# Patient Record
Sex: Male | Born: 1965 | Race: White | Hispanic: No | Marital: Married | Smoking: Never smoker
Health system: Southern US, Community
[De-identification: ages and names within clinical notes are randomized; demographics above are authoritative.]

## PROBLEM LIST (undated history)

## (undated) DIAGNOSIS — H269 Unspecified cataract: Secondary | ICD-10-CM

## (undated) DIAGNOSIS — E119 Type 2 diabetes mellitus without complications: Secondary | ICD-10-CM

## (undated) DIAGNOSIS — G4730 Sleep apnea, unspecified: Secondary | ICD-10-CM

## (undated) DIAGNOSIS — S82899A Other fracture of unspecified lower leg, initial encounter for closed fracture: Secondary | ICD-10-CM

## (undated) DIAGNOSIS — E785 Hyperlipidemia, unspecified: Secondary | ICD-10-CM

## (undated) DIAGNOSIS — E11319 Type 2 diabetes mellitus with unspecified diabetic retinopathy without macular edema: Secondary | ICD-10-CM

## (undated) DIAGNOSIS — I10 Essential (primary) hypertension: Secondary | ICD-10-CM

## (undated) DIAGNOSIS — Z8669 Personal history of other diseases of the nervous system and sense organs: Secondary | ICD-10-CM

## (undated) DIAGNOSIS — S42309A Unspecified fracture of shaft of humerus, unspecified arm, initial encounter for closed fracture: Secondary | ICD-10-CM

## (undated) DIAGNOSIS — M869 Osteomyelitis, unspecified: Secondary | ICD-10-CM

## (undated) HISTORY — DX: Hyperlipidemia, unspecified: E78.5

## (undated) HISTORY — DX: Personal history of other diseases of the nervous system and sense organs: Z86.69

## (undated) HISTORY — DX: Sleep apnea, unspecified: G47.30

## (undated) HISTORY — DX: Type 2 diabetes mellitus with unspecified diabetic retinopathy without macular edema: E11.319

## (undated) HISTORY — PX: WISDOM TOOTH EXTRACTION: SHX21

## (undated) HISTORY — DX: Unspecified fracture of shaft of humerus, unspecified arm, initial encounter for closed fracture: S42.309A

## (undated) HISTORY — DX: Osteomyelitis, unspecified: M86.9

## (undated) HISTORY — PX: APPENDECTOMY: SHX54

## (undated) HISTORY — DX: Other fracture of unspecified lower leg, initial encounter for closed fracture: S82.899A

## (undated) HISTORY — DX: Unspecified cataract: H26.9

## (undated) HISTORY — PX: EYE SURGERY: SHX253

---

## 1999-07-13 ENCOUNTER — Emergency Department (HOSPITAL_COMMUNITY): Admission: EM | Admit: 1999-07-13 | Discharge: 1999-07-13 | Payer: Self-pay | Admitting: Emergency Medicine

## 1999-07-13 ENCOUNTER — Encounter: Payer: Self-pay | Admitting: Emergency Medicine

## 1999-07-22 ENCOUNTER — Ambulatory Visit (HOSPITAL_COMMUNITY): Admission: RE | Admit: 1999-07-22 | Discharge: 1999-07-22 | Payer: Self-pay | Admitting: *Deleted

## 1999-07-22 ENCOUNTER — Encounter: Payer: Self-pay | Admitting: *Deleted

## 1999-10-05 ENCOUNTER — Encounter: Admission: RE | Admit: 1999-10-05 | Discharge: 2000-01-03 | Payer: Self-pay | Admitting: Internal Medicine

## 2002-05-28 ENCOUNTER — Encounter: Payer: Self-pay | Admitting: Emergency Medicine

## 2002-05-28 ENCOUNTER — Emergency Department (HOSPITAL_COMMUNITY): Admission: EM | Admit: 2002-05-28 | Discharge: 2002-05-28 | Payer: Self-pay | Admitting: Emergency Medicine

## 2004-01-07 ENCOUNTER — Emergency Department (HOSPITAL_COMMUNITY): Admission: EM | Admit: 2004-01-07 | Discharge: 2004-01-07 | Payer: Self-pay | Admitting: Emergency Medicine

## 2005-11-17 HISTORY — PX: COLONOSCOPY: SHX174

## 2006-06-20 ENCOUNTER — Emergency Department (HOSPITAL_COMMUNITY): Admission: EM | Admit: 2006-06-20 | Discharge: 2006-06-20 | Payer: Self-pay | Admitting: Emergency Medicine

## 2007-07-09 ENCOUNTER — Ambulatory Visit (HOSPITAL_COMMUNITY): Admission: RE | Admit: 2007-07-09 | Discharge: 2007-07-09 | Payer: Self-pay | Admitting: Internal Medicine

## 2007-07-09 ENCOUNTER — Telehealth (INDEPENDENT_AMBULATORY_CARE_PROVIDER_SITE_OTHER): Payer: Self-pay

## 2007-07-10 DIAGNOSIS — E119 Type 2 diabetes mellitus without complications: Secondary | ICD-10-CM | POA: Insufficient documentation

## 2007-07-10 DIAGNOSIS — I10 Essential (primary) hypertension: Secondary | ICD-10-CM | POA: Insufficient documentation

## 2007-07-10 DIAGNOSIS — K7689 Other specified diseases of liver: Secondary | ICD-10-CM | POA: Insufficient documentation

## 2007-07-10 DIAGNOSIS — R1011 Right upper quadrant pain: Secondary | ICD-10-CM | POA: Insufficient documentation

## 2007-07-11 ENCOUNTER — Ambulatory Visit: Payer: Self-pay | Admitting: Gastroenterology

## 2009-06-07 DIAGNOSIS — E781 Pure hyperglyceridemia: Secondary | ICD-10-CM | POA: Insufficient documentation

## 2010-02-01 NOTE — Progress Notes (Signed)
Summary: triage  Phone Note From Other Clinic Call back at [phone]   Caller: Referral Coordinator Malachi Bonds Dr Avva's office Call For: any doc Reason for Call: Schedule Patient Appt Summary of Call: Dr Felipa Eth want this pt seen asap b4 1st avail 7-29 due to abd pain nausea no fever no gb problem pt had normal abd u/s but has a lot of abd pain Initial call taken by: Tawni Levy,  July 09, 2007 2:42 PM  Follow-up for Phone Call        pt scheduled to [facility] for appt with Dr Jarold Motto 07-11-07 8:45. Follow-up by: Darcey Nora RN,  July 09, 2007 2:50 PM

## 2010-05-06 DIAGNOSIS — F39 Unspecified mood [affective] disorder: Secondary | ICD-10-CM | POA: Insufficient documentation

## 2014-04-01 ENCOUNTER — Encounter (HOSPITAL_BASED_OUTPATIENT_CLINIC_OR_DEPARTMENT_OTHER): Payer: Managed Care, Other (non HMO) | Attending: Surgery

## 2014-04-01 DIAGNOSIS — L84 Corns and callosities: Secondary | ICD-10-CM | POA: Diagnosis present

## 2014-04-01 DIAGNOSIS — Z794 Long term (current) use of insulin: Secondary | ICD-10-CM | POA: Insufficient documentation

## 2014-04-01 DIAGNOSIS — L97421 Non-pressure chronic ulcer of left heel and midfoot limited to breakdown of skin: Secondary | ICD-10-CM | POA: Insufficient documentation

## 2014-04-01 DIAGNOSIS — E11621 Type 2 diabetes mellitus with foot ulcer: Secondary | ICD-10-CM | POA: Diagnosis not present

## 2014-04-08 ENCOUNTER — Encounter (HOSPITAL_BASED_OUTPATIENT_CLINIC_OR_DEPARTMENT_OTHER): Payer: Managed Care, Other (non HMO) | Attending: Surgery

## 2014-04-08 DIAGNOSIS — E11621 Type 2 diabetes mellitus with foot ulcer: Secondary | ICD-10-CM | POA: Insufficient documentation

## 2014-04-08 DIAGNOSIS — L97521 Non-pressure chronic ulcer of other part of left foot limited to breakdown of skin: Secondary | ICD-10-CM | POA: Insufficient documentation

## 2014-04-10 ENCOUNTER — Encounter (HOSPITAL_BASED_OUTPATIENT_CLINIC_OR_DEPARTMENT_OTHER): Payer: Self-pay

## 2014-04-15 DIAGNOSIS — E11621 Type 2 diabetes mellitus with foot ulcer: Secondary | ICD-10-CM | POA: Diagnosis not present

## 2014-04-22 DIAGNOSIS — E11621 Type 2 diabetes mellitus with foot ulcer: Secondary | ICD-10-CM | POA: Diagnosis not present

## 2014-04-24 DIAGNOSIS — E11621 Type 2 diabetes mellitus with foot ulcer: Secondary | ICD-10-CM | POA: Diagnosis not present

## 2014-04-29 DIAGNOSIS — E11621 Type 2 diabetes mellitus with foot ulcer: Secondary | ICD-10-CM | POA: Diagnosis not present

## 2014-04-30 ENCOUNTER — Emergency Department (HOSPITAL_COMMUNITY)
Admission: EM | Admit: 2014-04-30 | Discharge: 2014-04-30 | Disposition: A | Discharge status: Home / Self Care | Payer: Managed Care, Other (non HMO) | Attending: Emergency Medicine | Admitting: Emergency Medicine

## 2014-04-30 ENCOUNTER — Encounter (HOSPITAL_COMMUNITY): Payer: Self-pay | Admitting: Emergency Medicine

## 2014-04-30 DIAGNOSIS — M545 Low back pain, unspecified: Secondary | ICD-10-CM

## 2014-04-30 MED ORDER — NAPROXEN 500 MG PO TABS: 500.0000 mg | ORAL_TABLET | Freq: Two times a day (BID) | ORAL | Status: DC

## 2014-04-30 MED ORDER — TRAMADOL HCL 50 MG PO TABS: 50.0000 mg | ORAL_TABLET | Freq: Four times a day (QID) | ORAL | Status: DC | PRN

## 2014-04-30 MED ORDER — METHOCARBAMOL 500 MG PO TABS: 500.0000 mg | ORAL_TABLET | Freq: Two times a day (BID) | ORAL | Status: DC

## 2014-04-30 NOTE — ED Provider Notes (Signed)
CSN: [id number]     Arrival date & time 04/30/14  1421 History  This chart was scribed for Hyman Bible PA-C working with Linton Flemings, MD by Mercy Moore, ED Scribe. This patient was seen in room TR07C/TR07C and the patient's care was started at 3:32 PM.   Chief Complaint  Patient presents with  . Back Pain    The history is provided by the patient. No language interpreter was used.   HPI Comments: [name] is a 49 y.o. male who presents to the Emergency Department complaining of worsening middle lower back pain since stepping out of the shower yesterday; patient suspects he strained a muscle. Patient reports spasming pain at his lower back even at rest and severely exacerbated pain with ambulation. Patient with history of Diabetes shares that he is wearing the cast to relieve pressure to a wound on his left heel that wound not heal appropriately. Patient denies fever, chills, bowel/bladder incontinence, numbness or tingling in his extremities. Patient denies personal history of cancer or IV drug use.   History reviewed. No pertinent past medical history. History reviewed. No pertinent past surgical history. History reviewed. No pertinent family history. History  Substance Use Topics  . Smoking status: Never Smoker   . Smokeless tobacco: Not on file  . Alcohol Use: No    Review of Systems  Constitutional: Negative for fever.  Gastrointestinal: Negative for nausea, vomiting, diarrhea and constipation.  Genitourinary: Negative for dysuria.  Musculoskeletal: Positive for back pain.  Skin: Positive for wound.      Allergies  Review of patient's allergies indicates no known allergies.  Home Medications   Prior to Admission medications   Not on File   Triage Vitals: BP 112/69 mmHg  Pulse 88  Temp(Src) 98.1 F (36.7 C) (Oral)  Resp 18  Ht 6\' 2"  (1.88 m)  Wt 270 lb (122.471 kg)  BMI 34.65 kg/m2  SpO2 97% Physical Exam  Constitutional: He is oriented to person,  place, and time. He appears well-developed and well-nourished. No distress.  HENT:  Head: Normocephalic and atraumatic.  Eyes: EOM are normal.  Neck: Neck supple. No tracheal deviation present.  Cardiovascular: Normal rate, regular rhythm and normal heart sounds.   Pulmonary/Chest: Effort normal and breath sounds normal. No respiratory distress.  Musculoskeletal: Normal range of motion.  No tenderness to palpation of lumbar spine. 2+ PT reflexes bilaterally. Muscle strength 5/5 in lower extremities bilaterally. 2+ DP pulse. Distal sensation of right foot intact.  Left foot in cast.   Neurological: He is alert and oriented to person, place, and time. He has normal strength. No sensory deficit. Gait normal.  Skin: Skin is warm and dry.  Psychiatric: He has a normal mood and affect. His behavior is normal.  Nursing note and vitals reviewed.   ED Course  Procedures (including critical care time)  COORDINATION OF CARE:  3:36 PM- Discussed treatment plan with patient at bedside and patient agreed to plan.   Labs Review Labs Reviewed - No data to display  Imaging Review No results found.   EKG Interpretation None      MDM   Final diagnoses:  None   Patient with back pain.  Suspect muscle strain.  No neurological deficits and normal neuro exam.  Patient can walk but states is painful.  No loss of bowel or bladder control.  No concern for cauda equina.  No fever, night sweats, weight loss, h/o cancer, IVDU.  RICE protocol and pain medicine indicated and  discussed with patient.   Patient stable for discharge.  Return precautions given.    Hyman Bible, PA-C 04/30/14 1609  Linton Flemings, MD 04/30/14 [phone]

## 2014-04-30 NOTE — ED Notes (Signed)
Declined W/C at D/C and was escorted to lobby by RN. 

## 2014-04-30 NOTE — ED Notes (Signed)
Pt c/o lower back pain with spasm

## 2014-04-30 NOTE — Discharge Instructions (Signed)
Take pain medication and muscle relaxer as needed.  Do not drive or operate heavy machinery for 4-6 hours after taking medication.

## 2014-05-06 ENCOUNTER — Encounter (HOSPITAL_BASED_OUTPATIENT_CLINIC_OR_DEPARTMENT_OTHER): Payer: Managed Care, Other (non HMO) | Attending: Surgery

## 2014-05-06 DIAGNOSIS — E11621 Type 2 diabetes mellitus with foot ulcer: Secondary | ICD-10-CM | POA: Diagnosis not present

## 2014-05-06 DIAGNOSIS — L97521 Non-pressure chronic ulcer of other part of left foot limited to breakdown of skin: Secondary | ICD-10-CM | POA: Insufficient documentation

## 2014-05-06 DIAGNOSIS — G629 Polyneuropathy, unspecified: Secondary | ICD-10-CM | POA: Insufficient documentation

## 2014-12-30 ENCOUNTER — Emergency Department (HOSPITAL_COMMUNITY)
Admission: EM | Admit: 2014-12-30 | Discharge: 2014-12-31 | Disposition: A | Discharge status: Home / Self Care | Payer: Managed Care, Other (non HMO) | Attending: Emergency Medicine | Admitting: Emergency Medicine

## 2014-12-30 ENCOUNTER — Encounter (HOSPITAL_COMMUNITY): Payer: Self-pay | Admitting: *Deleted

## 2014-12-30 ENCOUNTER — Emergency Department (HOSPITAL_COMMUNITY): Payer: Managed Care, Other (non HMO)

## 2014-12-30 DIAGNOSIS — L03115 Cellulitis of right lower limb: Secondary | ICD-10-CM | POA: Diagnosis not present

## 2014-12-30 DIAGNOSIS — L97519 Non-pressure chronic ulcer of other part of right foot with unspecified severity: Secondary | ICD-10-CM | POA: Diagnosis not present

## 2014-12-30 DIAGNOSIS — M79661 Pain in right lower leg: Secondary | ICD-10-CM | POA: Diagnosis present

## 2014-12-30 DIAGNOSIS — Z791 Long term (current) use of non-steroidal anti-inflammatories (NSAID): Secondary | ICD-10-CM | POA: Diagnosis not present

## 2014-12-30 DIAGNOSIS — E10621 Type 1 diabetes mellitus with foot ulcer: Secondary | ICD-10-CM | POA: Insufficient documentation

## 2014-12-30 DIAGNOSIS — I10 Essential (primary) hypertension: Secondary | ICD-10-CM | POA: Insufficient documentation

## 2014-12-30 DIAGNOSIS — Z79899 Other long term (current) drug therapy: Secondary | ICD-10-CM | POA: Insufficient documentation

## 2014-12-30 HISTORY — DX: Type 2 diabetes mellitus without complications: E11.9

## 2014-12-30 HISTORY — DX: Essential (primary) hypertension: I10

## 2014-12-30 LAB — CBC WITH DIFFERENTIAL/PLATELET
Basophils Absolute: 0 10*3/uL (ref 0.0–0.1)
Basophils Relative: 0 %
Eosinophils Absolute: 0 10*3/uL (ref 0.0–0.7)
Eosinophils Relative: 0 %
HCT: 45.1 % (ref 39.0–52.0)
Hemoglobin: 15.6 g/dL (ref 13.0–17.0)
Lymphocytes Relative: 12 %
Lymphs Abs: 1.8 10*3/uL (ref 0.7–4.0)
MCH: 30.1 pg (ref 26.0–34.0)
MCHC: 34.6 g/dL (ref 30.0–36.0)
MCV: 87.1 fL (ref 78.0–100.0)
Monocytes Absolute: 1.4 10*3/uL — ABNORMAL HIGH (ref 0.1–1.0)
Monocytes Relative: 9 %
Neutro Abs: 11.8 10*3/uL — ABNORMAL HIGH (ref 1.7–7.7)
Neutrophils Relative %: 79 %
Platelets: 253 10*3/uL (ref 150–400)
RBC: 5.18 MIL/uL (ref 4.22–5.81)
RDW: 12.8 % (ref 11.5–15.5)
WBC: 14.9 10*3/uL — ABNORMAL HIGH (ref 4.0–10.5)

## 2014-12-30 LAB — BASIC METABOLIC PANEL
Anion gap: 10 (ref 5–15)
BUN: 29 mg/dL — ABNORMAL HIGH (ref 6–20)
CO2: 25 mmol/L (ref 22–32)
Calcium: 8.8 mg/dL — ABNORMAL LOW (ref 8.9–10.3)
Chloride: 100 mmol/L — ABNORMAL LOW (ref 101–111)
Creatinine, Ser: 0.98 mg/dL (ref 0.61–1.24)
GFR calc Af Amer: >60 mL/min (ref >60)
GFR calc non Af Amer: >60 mL/min (ref >60)
Glucose, Bld: 107 mg/dL — ABNORMAL HIGH (ref 65–99)
Potassium: 4.4 mmol/L (ref 3.5–5.1)
Sodium: 135 mmol/L (ref 135–145)

## 2014-12-30 MED ORDER — VANCOMYCIN HCL 10 G IV SOLR
2000.0000 mg | Freq: Once | INTRAVENOUS | Status: AC
  Administered 2014-12-31: 2000 mg via INTRAVENOUS
  Filled 2014-12-30: qty 2000

## 2014-12-30 MED ORDER — VANCOMYCIN HCL 10 G IV SOLR
1250.0000 mg | Freq: Three times a day (TID) | INTRAVENOUS | Status: DC
  Filled 2014-12-30: qty 1250

## 2014-12-30 MED ORDER — SODIUM CHLORIDE 0.9 % IV SOLN
3.0000 g | Freq: Once | INTRAVENOUS | Status: AC
  Administered 2014-12-30: 3 g via INTRAVENOUS
  Filled 2014-12-30: qty 3

## 2014-12-30 NOTE — ED Notes (Signed)
Patient transported to X-ray 

## 2014-12-30 NOTE — ED Notes (Signed)
Spoke with phlebotomy about drawing second set of cultures prior to starting antibiotics.

## 2014-12-30 NOTE — ED Notes (Signed)
Pt reports rt leg pain, swelling. Pt stats that he is a diabetic and has a wound to the bottom of his rt foot that he has noticed for 4 days as well. Pt reports drainage from that as well.

## 2014-12-30 NOTE — Progress Notes (Signed)
ANTIBIOTIC CONSULT NOTE - INITIAL  Pharmacy Consult for Vancomycin Indication: wound infection  No Known Allergies  Patient Measurements: Height: 6\' 2"  (188 cm) Weight: 289 lb (131.09 kg) IBW/kg (Calculated) : 82.2 Adjusted Body Weight:   Vital Signs: Temp: 98.3 F (36.8 C) (12/28 1805) Temp Source: Oral (12/28 1805) BP: 151/80 mmHg (12/28 1805) Pulse Rate: 100 (12/28 1805) Intake/Output from previous day:   Intake/Output from this shift:    Labs: No results for input(s): WBC, HGB, PLT, LABCREA, CREATININE in the last 72 hours. CrCl cannot be calculated (Patient has no serum creatinine result on file.). No results for input(s): VANCOTROUGH, VANCOPEAK, VANCORANDOM, GENTTROUGH, GENTPEAK, GENTRANDOM, TOBRATROUGH, TOBRAPEAK, TOBRARND, AMIKACINPEAK, AMIKACINTROU, AMIKACIN in the last 72 hours.   Microbiology: No results found for this or any previous visit (from the past 720 hour(s)).  Medical History: Past Medical History  Diagnosis Date  . Diabetes mellitus without complication ([place])   . Hypertension     Medications:   (Not in a hospital admission) Scheduled:   Infusions:  . ampicillin-sulbactam (UNASYN) IV    . vancomycin     Assessment: 49yo male with history of DM2 presents with R leg pain, swelling and wound on bottom of R foot. Pharmacy is consulted to dose vancomycin for wound infection. Pt is afebrile, WBC 14.9, sCr 0.98.  Pt received unasyn 3g and vancomycin 2g IV once in the ED.  Goal of Therapy:  Vancomycin trough level 15-20 mcg/ml  Plan:  Vancomycin 1250mg  IV q8h Measure antibiotic drug levels at steady state Follow up culture results, renal function and clinical course  Andrey Cota. Diona Foley, PharmD, [place] Clinical Pharmacist Pager [phone] 12/30/2014,10:08 PM

## 2014-12-31 MED ORDER — AMOXICILLIN-POT CLAVULANATE 875-125 MG PO TABS: 1.0000 | ORAL_TABLET | Freq: Two times a day (BID) | ORAL | Status: DC

## 2014-12-31 MED ORDER — DOXYCYCLINE HYCLATE 100 MG PO CAPS: 100.0000 mg | ORAL_CAPSULE | Freq: Two times a day (BID) | ORAL | Status: DC

## 2014-12-31 NOTE — ED Notes (Signed)
Discharge instructions and prescriptions reviewed - voiced understanding,  Will call PMD for follow up as directed by Dr Jeneen Rinks

## 2014-12-31 NOTE — ED Notes (Signed)
Patient ambulatory to the [facility]

## 2014-12-31 NOTE — Discharge Instructions (Signed)

## 2014-12-31 NOTE — ED Provider Notes (Signed)
CSN: [id number]     Arrival date & time 12/30/14  1744 History   First MD Initiated Contact with Patient 12/30/14 2151     Chief Complaint  Patient presents with  . Leg Pain      HPI  Patient presents for evaluation of redness and swelling of his foot and a ulcer to the bottom of his foot for the last 2 days.  Dates that the ulcer has  been there for about 2 weeks. Foot became red and swollen and painful this morning when he got out of bed. No injury. Did not fall. He has neuropathy primarily gives him pain, but not numbness. He does have sensation in the feet area of he noticed swelling to dorsum of the foot aspect of the lower leg. No fevers no chills no nausea vomiting or systemic times.symptoms.  Past Medical History  Diagnosis Date  . Diabetes mellitus without complication ([place])   . Hypertension    History reviewed. No pertinent past surgical history. No family history on file. Social History  Substance Use Topics  . Smoking status: Never Smoker   . Smokeless tobacco: None  . Alcohol Use: No    Review of Systems  Constitutional: Negative for fever, chills, diaphoresis, appetite change and fatigue.  HENT: Negative for mouth sores, sore throat and trouble swallowing.   Eyes: Negative for visual disturbance.  Respiratory: Negative for cough, chest tightness, shortness of breath and wheezing.   Cardiovascular: Positive for leg swelling. Negative for chest pain.  Gastrointestinal: Negative for nausea, vomiting, abdominal pain, diarrhea and abdominal distention.  Endocrine: Negative for polydipsia, polyphagia and polyuria.  Genitourinary: Negative for dysuria, frequency and hematuria.  Musculoskeletal: Negative for gait problem.  Skin: Positive for wound. Negative for color change, pallor and rash.  Neurological: Negative for dizziness, syncope, light-headedness and headaches.  Hematological: Does not bruise/bleed easily.  Psychiatric/Behavioral: Negative for behavioral  problems and confusion.      Allergies  Review of patient's allergies indicates no known allergies.  Home Medications   Prior to Admission medications   Medication Sig Start Date End Date Taking? Authorizing Provider  amoxicillin-clavulanate (AUGMENTIN) 875-125 MG tablet Take 1 tablet by mouth 2 (two) times daily. 12/31/14   Tanna Furry, MD  doxycycline (VIBRAMYCIN) 100 MG capsule Take 1 capsule (100 mg total) by mouth 2 (two) times daily. 12/31/14   Tanna Furry, MD  methocarbamol (ROBAXIN) 500 MG tablet Take 1 tablet (500 mg total) by mouth 2 (two) times daily. 04/30/14   Heather Laisure, PA-C  naproxen (NAPROSYN) 500 MG tablet Take 1 tablet (500 mg total) by mouth 2 (two) times daily. 04/30/14   Heather Laisure, PA-C  traMADol (ULTRAM) 50 MG tablet Take 1 tablet (50 mg total) by mouth every 6 (six) hours as needed. 04/30/14   Heather Laisure, PA-C   BP 119/62 mmHg  Pulse 93  Temp(Src) 98.3 F (36.8 C) (Oral)  Resp 16  Ht 6\' 2"  (1.88 m)  Wt 289 lb (131.09 kg)  BMI 37.09 kg/m2  SpO2 97% Physical Exam  Constitutional: He is oriented to person, place, and time. He appears well-developed and well-nourished. No distress.  HENT:  Head: Normocephalic.  Eyes: Conjunctivae are normal. Pupils are equal, round, and reactive to light. No scleral icterus.  Neck: Normal range of motion. Neck supple. No thyromegaly present.  Cardiovascular: Normal rate and regular rhythm.  Exam reveals no gallop and no friction rub.   No murmur heard. Pulmonary/Chest: Effort normal and breath sounds normal. No  respiratory distress. He has no wheezes. He has no rales.  Abdominal: Soft. Bowel sounds are normal. He exhibits no distension. There is no tenderness. There is no rebound.  Musculoskeletal: Normal range of motion.  Neurological: He is alert and oriented to person, place, and time.  Skin: Skin is warm and dry. No rash noted.  Ulceration to the plantar aspect of the right foot. Soft tissue swelling to  lateral aspect of the foot dorsally. Redness to the dorsum of the ankle. No soft tissue swelling above the malleoli.  Psychiatric: He has a normal mood and affect. His behavior is normal.    ED Course  Procedures (including critical care time) Labs Review Labs Reviewed  CBC WITH DIFFERENTIAL/PLATELET - Abnormal; Notable for the following:    WBC 14.9 (*)    Neutro Abs 11.8 (*)    Monocytes Absolute 1.4 (*)    All other components within normal limits  BASIC METABOLIC PANEL - Abnormal; Notable for the following:    Chloride 100 (*)    Glucose, Bld 107 (*)    BUN 29 (*)    Calcium 8.8 (*)    All other components within normal limits  CULTURE, BLOOD (ROUTINE X 2)  CULTURE, BLOOD (ROUTINE X 2)    Imaging Review Dg Foot 2 Views Right  12/30/2014  CLINICAL DATA:  Ulceration at the dorsal distal aspect of the right fifth metatarsal. Initial encounter. EXAM: RIGHT FOOT - 2 VIEW COMPARISON:  None. FINDINGS: The known soft tissue ulceration is not well characterized on radiograph. Soft tissue swelling is noted about the distal fifth metatarsal. There is no evidence of osseous erosion. Scattered vascular calcifications are seen. There is no evidence of fracture or dislocation. Visualized joint spaces are grossly preserved. The subtalar joint is unremarkable. A plantar calcaneal spur is noted. IMPRESSION: No evidence of osseous erosion. Known soft tissue ulceration is not well characterized on radiograph. Scattered vascular calcifications seen. Electronically Signed   By: Garald Balding M.D.   On: 12/30/2014 22:56   I have personally reviewed and evaluated these images and lab results as part of my medical decision-making.   EKG Interpretation None      MDM   Final diagnoses:  Cellulitis of right lower extremity  Diabetic ulcer of right foot associated with type 1 diabetes mellitus (HCC)        Tanna Furry, MD 12/31/14 0022

## 2015-01-02 ENCOUNTER — Emergency Department (HOSPITAL_COMMUNITY)
Admission: EM | Admit: 2015-01-02 | Discharge: 2015-01-02 | Disposition: A | Discharge status: Home / Self Care | Payer: Managed Care, Other (non HMO) | Attending: Emergency Medicine | Admitting: Emergency Medicine

## 2015-01-02 ENCOUNTER — Encounter (HOSPITAL_COMMUNITY): Payer: Self-pay

## 2015-01-02 DIAGNOSIS — Z79899 Other long term (current) drug therapy: Secondary | ICD-10-CM | POA: Diagnosis not present

## 2015-01-02 DIAGNOSIS — I10 Essential (primary) hypertension: Secondary | ICD-10-CM | POA: Diagnosis not present

## 2015-01-02 DIAGNOSIS — M79661 Pain in right lower leg: Secondary | ICD-10-CM | POA: Diagnosis not present

## 2015-01-02 DIAGNOSIS — M79604 Pain in right leg: Secondary | ICD-10-CM

## 2015-01-02 DIAGNOSIS — Z7982 Long term (current) use of aspirin: Secondary | ICD-10-CM | POA: Insufficient documentation

## 2015-01-02 DIAGNOSIS — Z792 Long term (current) use of antibiotics: Secondary | ICD-10-CM | POA: Insufficient documentation

## 2015-01-02 DIAGNOSIS — E119 Type 2 diabetes mellitus without complications: Secondary | ICD-10-CM | POA: Insufficient documentation

## 2015-01-02 DIAGNOSIS — R21 Rash and other nonspecific skin eruption: Secondary | ICD-10-CM | POA: Diagnosis not present

## 2015-01-02 LAB — D-DIMER, QUANTITATIVE (NOT AT ARMC): D-Dimer, Quant: 0.31 ug/mL-FEU (ref 0.00–0.50)

## 2015-01-02 NOTE — ED Provider Notes (Signed)
CSN: [id number]     Arrival date & time 01/02/15  0515 History   First MD Initiated Contact with Patient 01/02/15 0535     Chief Complaint  Patient presents with  . Claudication  . Leg Pain     (Consider location/radiation/quality/duration/timing/severity/associated sxs/prior Treatment) HPI Comments: 49 year old male with history of diabetes, high blood pressure presents with right calf pain for the past 2-3 days. Patient is been treated for ulcer/cellulitis and has outpatient follow-up. Patient denies recent surgery, active cancer, significant swelling to the leg or blood clot history. Mild pain with walking. No vascular disease history.  Patient is a 49 y.o. male presenting with leg pain. The history is provided by the patient.  Leg Pain Associated symptoms: no back pain, no fever and no neck pain     Past Medical History  Diagnosis Date  . Diabetes mellitus without complication ([place])   . Hypertension    History reviewed. No pertinent past surgical history. History reviewed. No pertinent family history. Social History  Substance Use Topics  . Smoking status: Never Smoker   . Smokeless tobacco: None  . Alcohol Use: No    Review of Systems  Constitutional: Negative for fever and chills.  HENT: Negative for congestion.   Eyes: Negative for visual disturbance.  Respiratory: Negative for shortness of breath.   Cardiovascular: Negative for chest pain.  Gastrointestinal: Negative for vomiting and abdominal pain.  Genitourinary: Negative for dysuria and flank pain.  Musculoskeletal: Negative for back pain, neck pain and neck stiffness.  Skin: Positive for rash.  Neurological: Negative for light-headedness and headaches.      Allergies  Review of patient's allergies indicates no known allergies.  Home Medications   Prior to Admission medications   Medication Sig Start Date End Date Taking? Authorizing Provider  amoxicillin-clavulanate (AUGMENTIN) 875-125 MG tablet Take  1 tablet by mouth 2 (two) times daily. 12/31/14  Yes Tanna Furry, MD  aspirin EC 81 MG tablet Take 81 mg by mouth daily.   Yes Historical Provider, MD  doxycycline (VIBRAMYCIN) 100 MG capsule Take 1 capsule (100 mg total) by mouth 2 (two) times daily. 12/31/14  Yes Tanna Furry, MD  glimepiride (AMARYL) 2 MG tablet Take 2 mg by mouth 2 (two) times daily. 10/26/14  Yes Historical Provider, MD  INVOKANA 300 MG TABS tablet Take 300 mg by mouth daily. 11/09/14  Yes Historical Provider, MD  metFORMIN (GLUCOPHAGE) 1000 MG tablet Take 1,000 mg by mouth 2 (two) times daily. 11/09/14  Yes Historical Provider, MD  Olmesartan-Amlodipine-HCTZ 40-10-25 MG TABS Take 1 tablet by mouth daily. 12/28/14  Yes Historical Provider, MD  TRESIBA FLEXTOUCH 200 UNIT/ML SOPN Inject 100 Units into the skin daily. 12/28/14  Yes Historical Provider, MD  methocarbamol (ROBAXIN) 500 MG tablet Take 1 tablet (500 mg total) by mouth 2 (two) times daily. Patient not taking: Reported on 12/31/2014 04/30/14   Hyman Bible, PA-C  naproxen (NAPROSYN) 500 MG tablet Take 1 tablet (500 mg total) by mouth 2 (two) times daily. Patient not taking: Reported on 12/31/2014 04/30/14   Hyman Bible, PA-C  traMADol (ULTRAM) 50 MG tablet Take 1 tablet (50 mg total) by mouth every 6 (six) hours as needed. Patient not taking: Reported on 12/31/2014 04/30/14   Hyman Bible, PA-C   BP 143/84 mmHg  Pulse 89  Temp(Src) 97.5 F (36.4 C) (Oral)  Resp 16  Ht 6\' 2"  (1.88 m)  Wt 289 lb (131.09 kg)  BMI 37.09 kg/m2  SpO2 98% Physical Exam  Constitutional: He is oriented to person, place, and time. He appears well-developed and well-nourished. No distress.  HENT:  Head: Normocephalic and atraumatic.  Eyes: Right eye exhibits no discharge. Left eye exhibits no discharge.  Neck: No tracheal deviation present.  Cardiovascular: Normal rate.   Pulmonary/Chest: Effort normal.  Abdominal: Soft.  Musculoskeletal: He exhibits tenderness. He exhibits no  edema.  Neurological: He is alert and oriented to person, place, and time.  Skin: Skin is warm. No rash noted.  Mild tenderness medial upper right calf, no signs infection on the skin where tenderness is. 2+ dorsalis pedis and posterior tibial pulses in the right.  Psychiatric: He has a normal mood and affect.  Nursing note and vitals reviewed.   ED Course  Procedures (including critical care time) Labs Review Labs Reviewed  D-DIMER, QUANTITATIVE (NOT AT [facility])    Imaging Review No results found. I have personally reviewed and evaluated these images and lab results as part of my medical decision-making.   EKG Interpretation None      MDM   Final diagnoses:  Right leg pain   Patient presents with right calf pain, patient low risk blood clot. Plan for ultrasound or d dimer if unable to obtain US am on the weekend.  Ddimer neg. outpt fup  Results and differential diagnosis were discussed with the patient/parent/guardian. Xrays were independently reviewed by myself.  Close follow up outpatient was discussed, comfortable with the plan.   Medications - No data to display  Filed Vitals:   01/02/15 0522  BP: 143/84  Pulse: 89  Temp: 97.5 F (36.4 C)  TempSrc: Oral  Resp: 16  Height: 6\' 2"  (1.88 m)  Weight: 289 lb (131.09 kg)  SpO2: 98%    Final diagnoses:  Right leg pain        Elnora Morrison, MD 01/02/15 [phone]

## 2015-01-02 NOTE — ED Notes (Signed)
Pt from home via pov. Pt began having right leg pain and was diagnosed with cullitis in the right foot was treated with antibiotics here and sent home with antibiotics. Pt reports that pain is still there and worse with walking. Pt concerned that he has a blood clot. Good pedal pulse palpated in right foot. VSS

## 2015-01-02 NOTE — Discharge Instructions (Signed)
Obtain formal ultrasound if no improvement in pain the next week.  If you were given medicines take as directed.  If you are on coumadin or contraceptives realize their levels and effectiveness is altered by many different medicines.  If you have any reaction (rash, tongues swelling, other) to the medicines stop taking and see a physician.    If your blood pressure was elevated in the ER make sure you follow up for management with a primary doctor or return for chest pain, shortness of breath or stroke symptoms.  Please follow up as directed and return to the ER or see a physician for new or worsening symptoms.  Thank you. Filed Vitals:   01/02/15 0522  BP: 143/84  Pulse: 89  Temp: 97.5 F (36.4 C)  TempSrc: Oral  Resp: 16  Height: 6\' 2"  (1.88 m)  Weight: 289 lb (131.09 kg)  SpO2: 98%

## 2015-01-04 LAB — CULTURE, BLOOD (ROUTINE X 2): Culture: NO GROWTH

## 2015-01-05 LAB — CULTURE, BLOOD (ROUTINE X 2): Culture: NO GROWTH

## 2015-01-07 ENCOUNTER — Encounter: Payer: Managed Care, Other (non HMO) | Attending: Surgery | Admitting: Surgery

## 2015-01-07 DIAGNOSIS — L84 Corns and callosities: Secondary | ICD-10-CM | POA: Diagnosis not present

## 2015-01-07 DIAGNOSIS — E1140 Type 2 diabetes mellitus with diabetic neuropathy, unspecified: Secondary | ICD-10-CM | POA: Insufficient documentation

## 2015-01-07 DIAGNOSIS — Z794 Long term (current) use of insulin: Secondary | ICD-10-CM | POA: Insufficient documentation

## 2015-01-07 DIAGNOSIS — E785 Hyperlipidemia, unspecified: Secondary | ICD-10-CM | POA: Insufficient documentation

## 2015-01-07 DIAGNOSIS — E11621 Type 2 diabetes mellitus with foot ulcer: Secondary | ICD-10-CM | POA: Diagnosis present

## 2015-01-07 DIAGNOSIS — I10 Essential (primary) hypertension: Secondary | ICD-10-CM | POA: Insufficient documentation

## 2015-01-07 DIAGNOSIS — L97512 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d: Secondary | ICD-10-CM | POA: Diagnosis not present

## 2015-01-08 NOTE — Progress Notes (Signed)
FRANZ, ASELTINE ([id number]) Visit Report for 01/07/2015 Abuse/Suicide Risk Screen Details [name] Date of Service: 01/07/2015 8:45 AM Patient Name: J. Patient Account Number: [id number] Medical Record Afful, RN, BSN, [id number] Treating RN: Number: Velva Harman Date of Birth/Sex: 10-03-1965 (50 y.o. Male) Other Clinician: Primary Care Physician: [name] Treating Britto, Errol Referring Physician: [name] Physician/Extender: Weeks in Treatment: 0 Abuse/Suicide Risk Screen Items Answer ABUSE/SUICIDE RISK SCREEN: Has anyone close to you tried to hurt or harm you recentlyo No Do you feel uncomfortable with anyone in your familyo No Has anyone forced you do things that you didnot want to doo No Do you have any thoughts of harming yourselfo No Patient displays signs or symptoms of abuse and/or neglect. No Electronic Signature(s) Signed: 01/07/2015 3:35:50 PM By: Regan Lemming BSN, RN Entered By: Regan Lemming on 01/07/2015 08:57:36 [name] ([id number]) -------------------------------------------------------------------------------- Activities of Daily Living Details [name] Date of Service: 01/07/2015 8:45 AM Patient Name: J. Patient Account Number: [id number] Medical Record Afful, RN, BSN, [id number] Treating RN: Number: Velva Harman Date of Birth/Sex: 05/31/1965 (50 y.o. Male) Other Clinician: Primary Care Physician: [name] Treating Christin Fudge Referring Physician: [name] Physician/Extender: Weeks in Treatment: 0 Activities of Daily Living Items Answer Activities of Daily Living (Please select one for each item) Drive Automobile Completely Able Take Medications Completely Able Use Telephone Completely Able Care for Appearance Completely Able Use Toilet Completely Able Bath / Shower Completely Able Dress Self Completely Able Feed Self Completely Able Walk Completely Able Get In / Out Bed Completely Able Housework  Completely Able Prepare Meals Completely [place] for Self Completely Able Electronic Signature(s) Signed: 01/07/2015 3:35:50 PM By: Regan Lemming BSN, RN Entered By: Regan Lemming on 01/07/2015 08:57:53 [name] ([id number]) -------------------------------------------------------------------------------- Education Assessment Details [name] Date of Service: 01/07/2015 8:45 AM Patient Name: J. Patient Account Number: [id number] Medical Record Afful, RN, BSN, [id number] Treating RN: Number: Velva Harman Date of Birth/Sex: 06/27/1965 (50 y.o. Male) Other Clinician: Primary Care Physician: [name] Treating Christin Fudge Referring Physician: [name] Physician/Extender: [place] in Treatment: 0 Primary Learner Assessed: Patient Learning Preferences/Education Level/Primary Language Learning Preference: Explanation Highest Education Level: High School Preferred Language: English Cognitive Barrier Assessment/Beliefs Language Barrier: No Physical Barrier Assessment Impaired Vision: Yes Glasses Impaired Hearing: No Decreased Hand dexterity: No Knowledge/Comprehension Assessment Knowledge Level: High Comprehension Level: High Ability to understand written High instructions: Ability to understand verbal High instructions: Motivation Assessment Anxiety Level: Calm Cooperation: Cooperative Education Importance: Acknowledges Need Interest in Health Problems: Asks Questions Perception: Coherent Willingness to Engage in Self- High Management Activities: Readiness to Engage in Self- High Management Activities: Electronic Signature(s) Signed: 01/07/2015 3:35:50 PM By: Regan Lemming BSN, RN Entered By: Regan Lemming on 01/07/2015 08:58:15 [name] ([id number]) MITSUGI, LOWREY ([id number]) -------------------------------------------------------------------------------- Fall Risk Assessment Details [name]  Date of Service: 01/07/2015 8:45 AM Patient Name: J. Patient Account Number: [id number] Medical Record Afful, RN, BSN, [id number] Treating RN: Number: Velva Harman Date of Birth/Sex: 15-Mar-1965 (50 y.o. Male) Other Clinician: Primary Care Physician: Paticia Stack, Errol Referring Physician: [name] Physician/Extender: Weeks in Treatment: 0 Fall Risk Assessment Items Have you had 2 or more falls in the last 12 monthso 0 No Have you had any fall that resulted in injury in the last 12 monthso 0 No FALL RISK ASSESSMENT: History of falling - immediate or within 3 months 0 No Secondary diagnosis 0 No Ambulatory aid None/bed rest/wheelchair/nurse 0 Yes Crutches/cane/walker 0 No Furniture 0 No  IV Access/Saline Lock 0 No Gait/Training Normal/bed rest/immobile 0 Yes Weak 0 No Impaired 0 No Mental Status Oriented to own ability 0 Yes Electronic Signature(s) Signed: 01/07/2015 3:35:50 PM By: Regan Lemming BSN, RN Entered By: Regan Lemming on 01/07/2015 08:58:30 [name] ([id number]) -------------------------------------------------------------------------------- Foot Assessment Details [name] Date of Service: 01/07/2015 8:45 AM Patient Name: J. Patient Account Number: [id number] Medical Record Afful, RN, BSN, [id number] Treating RN: Number: Velva Harman Date of Birth/Sex: 1965/06/10 (50 y.o. Male) Other Clinician: Primary Care Physician: Paticia Stack, Errol Referring Physician: [name] Physician/Extender: Weeks in Treatment: 0 Foot Assessment Items Site Locations + = Sensation present, - = Sensation absent, C = Callus, U = Ulcer R = Redness, W = Warmth, M = Maceration, PU = Pre-ulcerative lesion F = Fissure, S = Swelling, D = Dryness Assessment Right: Left: Other Deformity: No No Prior Foot Ulcer: No No Prior Amputation: No No Charcot Joint: No No Ambulatory Status: Ambulatory Without Help Gait: Steady Electronic  Signature(s) Signed: 01/07/2015 3:35:50 PM By: Regan Lemming BSN, RN Entered By: Regan Lemming on 01/07/2015 08:58:54 [name] ([id number]) -------------------------------------------------------------------------------- Nutrition Risk Assessment Details Silversmith, Omid Date of Service: 01/07/2015 8:45 AM Patient Name: J. Patient Account Number: [id number] Medical Record Afful, RN, BSN, [id number] Treating RN: Number: Velva Harman Date of Birth/Sex: [birth year] (50 y.o. Male) Other Clinician: Primary Care Physician: Paticia Stack, Errol Referring Physician: [name] Physician/Extender: Weeks in Treatment: 0 Height (in): 74 Weight (lbs): 288 Body Mass Index (BMI): 37 Nutrition Risk Assessment Items NUTRITION RISK SCREEN: I have an illness or condition that made me change the kind and/or 0 No amount of food I eat I eat fewer than two meals per day 0 No I eat few fruits and vegetables, or milk products 0 No I have three or more drinks of beer, liquor or wine almost every day 0 No I have tooth or mouth problems that make it hard for me to eat 0 No I don't always have enough money to buy the food I need 0 No I eat alone most of the time 0 No I take three or more different prescribed or over-the-counter drugs a 0 No day Without wanting to, I have lost or gained 10 pounds in the last six 0 No months I am not always physically able to shop, cook and/or feed myself 0 No Nutrition Protocols Good Risk Protocol 0 No interventions needed Moderate Risk Protocol Electronic Signature(s) Signed: 01/07/2015 3:35:50 PM By: Regan Lemming BSN, RN Entered By: Regan Lemming on 01/07/2015 08:58:41

## 2015-01-08 NOTE — Progress Notes (Signed)
TRIEU, KOREY ([id number]) Visit Report for 01/07/2015 Allergy List Details Patient Name: [name], [name]. Date of Service: 01/07/2015 8:45 AM Medical Record Number: [id number] Patient Account Number: [id number] Date of Birth/Sex: [birth year] (50 y.o. Male) Treating RN: Baruch Gouty, RN, BSN, Velva Harman Primary Care Physician: [name] Other Clinician: Referring Physician: [name] Treating Physician/Extender: Frann Rider in Treatment: 0 Allergies Active Allergies no known allergies Allergy Notes Electronic Signature(s) Signed: 01/07/2015 3:35:50 PM By: Regan Lemming BSN, RN Entered By: Regan Lemming on 01/07/2015 08:59:17 [name] ([id number]) -------------------------------------------------------------------------------- Arrival Information Details Patient Name: [name] Date of Service: 01/07/2015 8:45 AM Medical Record Number: [id number] Patient Account Number: [id number] Date of Birth/Sex: 29-Jul-1965 (50 y.o. Male) Treating RN: Baruch Gouty, RN, BSN, Velva Harman Primary Care Physician: [name] Other Clinician: Referring Physician: [name] Treating Physician/Extender: Frann Rider in Treatment: 0 Visit Information Patient Arrived: Ambulatory Arrival Time: 08:52 Accompanied By: self Transfer Assistance: None Patient Identification Verified: Yes Secondary Verification Process Yes Completed: Patient Requires Transmission- No Based Precautions: Patient Has Alerts: Yes Patient Alerts: [id number] 7.3 12/2014 Electronic Signature(s) Signed: 01/07/2015 3:35:50 PM By: Regan Lemming BSN, RN Entered By: Regan Lemming on 01/07/2015 09:16:38 [name] ([id number]) -------------------------------------------------------------------------------- Clinic Level of Care Assessment Details Patient Name: [name] Date of Service: 01/07/2015 8:45 AM Medical Record Number: [id number] Patient Account Number: [id number] Date of  Birth/Sex: [birth year] (50 y.o. Male) Treating RN: Baruch Gouty, RN, BSN, Velva Harman Primary Care Physician: [name] Other Clinician: Referring Physician: [name] Treating Physician/Extender: Frann Rider in Treatment: 0 Clinic Level of Care Assessment Items TOOL 1 Quantity Score []  - Use when EandM and Procedure is performed on INITIAL visit 0 ASSESSMENTS - Nursing Assessment / Reassessment X - General Physical Exam (combine w/ comprehensive assessment (listed just 1 20 below) when performed on new pt. evals) X - Comprehensive Assessment (HX, ROS, Risk Assessments, Wounds Hx, etc.) 1 25 ASSESSMENTS - Wound and Skin Assessment / Reassessment []  - Dermatologic / Skin Assessment (not related to wound area) 0 ASSESSMENTS - Ostomy and/or Continence Assessment and Care []  - Incontinence Assessment and Management 0 []  - Ostomy Care Assessment and Management (repouching, etc.) 0 PROCESS - Coordination of Care X - Simple Patient / Family Education for ongoing care 1 15 []  - Complex (extensive) Patient / Family Education for ongoing care 0 []  - Staff obtains Programmer, systems, Records, Test Results / Process Orders 0 []  - Staff telephones HHA, Nursing Homes / Clarify orders / etc 0 []  - Routine Transfer to another Facility (non-emergent condition) 0 []  - Routine Hospital Admission (non-emergent condition) 0 X - New Admissions / Biomedical engineer / Ordering NPWT, Apligraf, etc. 1 15 []  - Emergency Hospital Admission (emergent condition) 0 PROCESS - Special Needs []  - Pediatric / Minor Patient Management 0 []  - Isolation Patient Management 0 [name], [name] ([id number]) []  - Hearing / Language / Visual special needs 0 []  - Assessment of Community assistance (transportation, D/C planning, etc.) 0 []  - Additional assistance / Altered mentation 0 []  - Support Surface(s) Assessment (bed, cushion, seat, etc.) 0 INTERVENTIONS - Miscellaneous []  - External ear exam 0 []  - Patient  Transfer (multiple staff / Civil Service fast streamer / Similar devices) 0 []  - Simple Staple / Suture removal (25 or less) 0 []  - Complex Staple / Suture removal (26 or more) 0 []  - Hypo/Hyperglycemic Management (do not check if billed separately) 0 X - Ankle / Brachial Index (ABI) - do not check if billed separately 1 15  Has the patient been seen at the hospital within the last three years: Yes Total Score: 90 Level Of Care: New/Established - Level 3 Electronic Signature(s) Signed: 01/07/2015 3:35:50 PM By: Regan Lemming BSN, RN Entered By: Regan Lemming on 01/07/2015 09:30:01 [name] ([id number]) -------------------------------------------------------------------------------- Encounter Discharge Information Details Patient Name: [name], [name]. Date of Service: 01/07/2015 8:45 AM Medical Record Number: [id number] Patient Account Number: [id number] Date of Birth/Sex: [birth year] (50 y.o. Male) Treating RN: Baruch Gouty, RN, BSN, Velva Harman Primary Care Physician: [name] Other Clinician: Referring Physician: [name] Treating Physician/Extender: Frann Rider in Treatment: 0 Encounter Discharge Information Items Discharge Pain Level: 0 Discharge Condition: Stable Ambulatory Status: Ambulatory Discharge Destination: Home Transportation: Private Auto Accompanied By: self Schedule Follow-up Appointment: No Medication Reconciliation completed and provided to Patient/Care No [name]: Provided on Clinical Summary of Care: 01/07/2015 Form Type Recipient Paper Patient DP Electronic Signature(s) Signed: 01/07/2015 10:20:58 AM By: Ruthine Dose Entered By: Ruthine Dose on 01/07/2015 10:20:58 [name] ([id number]) -------------------------------------------------------------------------------- Lower Extremity Assessment Details Patient Name: [name] Date of Service: 01/07/2015 8:45 AM Medical Record Number: [id number] Patient Account Number:  [id number] Date of Birth/Sex: 1965-07-16 (50 y.o. Male) Treating RN: Baruch Gouty, RN, BSN, Velva Harman Primary Care Physician: [name] Other Clinician: Referring Physician: [name] Treating Physician/Extender: Frann Rider in Treatment: 0 Vascular Assessment Claudication: Claudication Assessment [Left:None] [Right:None] Pulses: Posterior Tibial Palpable: [Left:Yes] [Right:Yes] Doppler: [Left:Multiphasic] Dorsalis Pedis Palpable: [Left:Yes] [Right:Yes] Doppler: [Left:Multiphasic] Extremity colors, hair growth, and conditions: Extremity Color: [Left:Normal] Hair Growth on Extremity: [Left:Yes] Temperature of Extremity: [Left:Warm] Capillary Refill: [Left:< 3 seconds] Blood Pressure: Brachial: [Left:130] Dorsalis Pedis: 120 [Left:Dorsalis Pedis: 120] Ankle: Posterior Tibial: 150 [Left:Posterior Tibial: 140 1.15] [Right:1.08] Toe Nail Assessment Left: Right: Thick: No Discolored: No Deformed: No Improper Length and Hygiene: No Electronic Signature(s) Signed: 01/07/2015 3:35:50 PM By: Regan Lemming BSN, RN Entered By: Regan Lemming on 01/07/2015 09:16:03 [name] ([id number]) -------------------------------------------------------------------------------- Multi Wound Chart Details Patient Name: [name] Date of Service: 01/07/2015 8:45 AM Medical Record Number: [id number] Patient Account Number: [id number] Date of Birth/Sex: October 18, 1965 (50 y.o. Male) Treating RN: Baruch Gouty, RN, BSN, Velva Harman Primary Care Physician: [name] Other Clinician: Referring Physician: [name] Treating Physician/Extender: Frann Rider in Treatment: 0 Vital Signs Height(in): 74 Pulse(bpm): 100 Weight(lbs): 288 Blood Pressure 130/74 (mmHg): Body Mass Index(BMI): 37 Temperature(F): 98.1 Respiratory Rate 18 (breaths/min): Photos: [1:No Photos] [N/A:N/A] Wound Location: [1:Right Foot - Plantar] [N/A:N/A] Wounding Event: [1:Gradually Appeared]  [N/A:N/A] Primary Etiology: [1:Diabetic Wound/Ulcer of the Lower Extremity] [N/A:N/A] Comorbid History: [1:Hypertension, Type II Diabetes, Neuropathy] [N/A:N/A] Date Acquired: [1:12/30/2014] [N/A:N/A] Weeks of Treatment: [1:0] [N/A:N/A] Wound Status: [1:Open] [N/A:N/A] Measurements L x W x D 1x1x0.3 [N/A:N/A] (cm) Area (cm) : [1:0.785] [N/A:N/A] Volume (cm) : [1:0.236] [N/A:N/A] % Reduction in Area: [1:0.00%] [N/A:N/A] % Reduction in Volume: 0.00% [N/A:N/A] Classification: [1:Grade 1] [N/A:N/A] Exudate Amount: [1:Small] [N/A:N/A] Exudate Type: [1:Serosanguineous] [N/A:N/A] Exudate Color: [1:red, brown] [N/A:N/A] Wound Margin: [1:Distinct, outline attached] [N/A:N/A] Granulation Amount: [1:Medium (34-66%)] [N/A:N/A] Necrotic Amount: [1:Small (1-33%)] [N/A:N/A] Exposed Structures: [1:Fascia: No Fat: No Tendon: No Muscle: No Joint: No Bone: No] [N/A:N/A] Limited to Skin Breakdown Epithelialization: None N/A N/A Periwound Skin Texture: Edema: No N/A N/A Excoriation: No Induration: No Callus: No Crepitus: No Fluctuance: No Friable: No Rash: No Scarring: No Periwound Skin Moist: Yes N/A N/A Moisture: Maceration: No Dry/Scaly: No Periwound Skin Color: Atrophie Blanche: No N/A N/A Cyanosis: No Ecchymosis: No Erythema: No Hemosiderin Staining: No Mottled: No Pallor: No Rubor: No Temperature:  No Abnormality N/A N/A Tenderness on No N/A N/A Palpation: Wound Preparation: Ulcer Cleansing: N/A N/A Rinsed/Irrigated with Saline Topical Anesthetic Applied: Other: Lidocaine 4% Treatment Notes Electronic Signature(s) Signed: 01/07/2015 3:35:50 PM By: Regan Lemming BSN, RN Entered By: Regan Lemming on 01/07/2015 09:28:06 [name] ([id number]) -------------------------------------------------------------------------------- [place] Details Patient Name: [name], [name]. Date of Service: 01/07/2015 8:45 AM Medical Record Number:  [id number] Patient Account Number: [id number] Date of Birth/Sex: [birth year] (50 y.o. Male) Treating RN: Baruch Gouty, RN, BSN, Velva Harman Primary Care Physician: [name] Other Clinician: Referring Physician: [name] Treating Physician/Extender: Frann Rider in Treatment: 0 Active Inactive HBO Nursing Diagnoses: Anxiety related to feelings of confinement associated with the hyperbaric oxygen chamber Anxiety related to knowledge deficit of hyperbaric oxygen therapy and treatment procedures Discomfort related to temperature and humidity changes inside hyperbaric chamber Potential for barotraumas to ears, sinuses, teeth, and lungs or cerebral gas embolism related to changes in atmospheric pressure inside hyperbaric oxygen chamber Potential for oxygen toxicity seizures related to delivery of 100% oxygen at an increased atmospheric pressure Potential for pulmonary oxygen toxicity related to delivery of 100% oxygen at an increased atmospheric pressure Goals: Barotrauma will be prevented during HBO2 Date Initiated: 01/07/2015 Goal Status: Active Patient and/or family will be able to state/discuss factors appropriate to the management of their disease process during treatment Date Initiated: 01/07/2015 Goal Status: Active Patient will tolerate the hyperbaric oxygen therapy treatment Date Initiated: 01/07/2015 Goal Status: Active Patient will tolerate the internal climate of the chamber Date Initiated: 01/07/2015 Goal Status: Active Patient/caregiver will verbalize understanding of HBO goals, rationale, procedures and potential hazards Date Initiated: 01/07/2015 Goal Status: Active Signs and symptoms of pulmonary oxygen toxicity will be recognized and promptly addressed Date Initiated: 01/07/2015 Goal Status: Active Signs and symptoms of seizure will be recognized and promptly addressed ; seizing patients will suffer no harm Date Initiated: 01/07/2015 [name]  ([id number]) Goal Status: Active Interventions: Administer a five (5) minute air break for patient if signs and symptoms of seizure appear and notify the hyperbaric physician Administer a ten (10) minute air break for patient if signs and symptoms of seizure appear and notify the hyperbaric physician Administer decongestants, per physician orders, prior to HBO2 Administer the correct therapeutic gas delivery based on the patients needs and limitations, per physician order Assess and provide for patientos comfort related to the hyperbaric environment and equalization of middle ear Assess for signs and symptoms related to adverse events, including but not limited to confinement anxiety, pneumothorax, oxygen toxicity and baurotrauma Assess patient for any history of confinement anxiety Assess patient's knowledge and expectations regarding hyperbaric medicine and provide education related to the hyperbaric environment, goals of treatment and prevention of adverse events Implement protocols to decrease risk of pneumothorax in high risk patients Notes: Orientation to the Wound Care Program Nursing Diagnoses: Knowledge deficit related to the wound healing center program Goals: Patient/caregiver will verbalize understanding of the [facility] Program Date Initiated: 01/07/2015 Goal Status: Active Interventions: Provide education on orientation to the wound center Notes: Peripheral Neuropathy Nursing Diagnoses: Knowledge deficit related to disease process and management of peripheral neurovascular dysfunction Potential alteration in peripheral tissue perfusion (select prior to confirmation of diagnosis) Goals: Patient/caregiver will verbalize understanding of disease process and disease management Date Initiated: 01/07/2015 Goal Status: Active [name], [name] ([id number]) Interventions: Assess signs and symptoms of neuropathy upon admission and as needed Provide education on  Management of Neuropathy and Related Ulcers Provide education on Management  of Neuropathy upon discharge from the [facility] for HBO Treatment Activities: Consult for HBO : 01/07/2015 Patient referred for customized footwear/offloading : 01/07/2015 Notes: Wound/Skin Impairment Nursing Diagnoses: Impaired tissue integrity Knowledge deficit related to ulceration/compromised skin integrity Goals: Patient/caregiver will verbalize understanding of skin care regimen Date Initiated: 01/07/2015 Goal Status: Active Ulcer/skin breakdown will have a volume reduction of 30% by week 4 Date Initiated: 01/07/2015 Goal Status: Active Ulcer/skin breakdown will have a volume reduction of 50% by week 8 Date Initiated: 01/07/2015 Goal Status: Active Ulcer/skin breakdown will have a volume reduction of 80% by week 12 Date Initiated: 01/07/2015 Goal Status: Active Ulcer/skin breakdown will heal within 14 weeks Date Initiated: 01/07/2015 Goal Status: Active Interventions: Assess patient/caregiver ability to obtain necessary supplies Assess patient/caregiver ability to perform ulcer/skin care regimen upon admission and as needed Assess ulceration(s) every visit Provide education on ulcer and skin care Treatment Activities: Referred to DME Donel Osowski for dressing supplies : 01/07/2015 Skin care regimen initiated : 01/07/2015 [name], [name] ([id number]) Topical wound management initiated : 01/07/2015 Notes: Electronic Signature(s) Signed: 01/07/2015 3:35:50 PM By: Regan Lemming BSN, RN Entered By: Regan Lemming on 01/07/2015 09:27:46 [name] ([id number]) -------------------------------------------------------------------------------- Pain Assessment Details Patient Name: [name] Date of Service: 01/07/2015 8:45 AM Medical Record Number: [id number] Patient Account Number: [id number] Date of Birth/Sex: July 01, 1965 (50 y.o. Male) Treating RN: Baruch Gouty, RN, BSN, Velva Harman Primary Care Physician:  [name] Other Clinician: Referring Physician: [name] Treating Physician/Extender: Frann Rider in Treatment: 0 Active Problems Location of Pain Severity and Description of Pain Patient Has Paino No Site Locations Pain Management and Medication Current Pain Management: Electronic Signature(s) Signed: 01/07/2015 3:35:50 PM By: Regan Lemming BSN, RN Entered By: Regan Lemming on 01/07/2015 08:56:18 [name] ([id number]) -------------------------------------------------------------------------------- Patient/Caregiver Education Details Patient Name: [name] Date of Service: 01/07/2015 8:45 AM Medical Record Number: [id number] Patient Account Number: [id number] Date of Birth/Gender: 16-Oct-1965 (50 y.o. Male) Treating RN: Baruch Gouty, RN, BSN, Velva Harman Primary Care Physician: [name] Other Clinician: Referring Physician: [name] Treating Physician/Extender: Frann Rider in Treatment: 0 Education Assessment Education Provided To: Patient Education Topics Provided Peripheral Neuropathy: Methods: Explain/Verbal Responses: State content correctly Welcome To [facility]: Methods: Explain/Verbal Responses: State content correctly Wound/Skin Impairment: Methods: Explain/Verbal Responses: State content correctly Electronic Signature(s) Signed: 01/07/2015 3:35:50 PM By: Regan Lemming BSN, RN Entered By: Regan Lemming on 01/07/2015 09:30:44 [name] ([id number]) -------------------------------------------------------------------------------- Wound Assessment Details Patient Name: [name] Date of Service: 01/07/2015 8:45 AM Medical Record Number: [id number] Patient Account Number: [id number] Date of Birth/Sex: 28-Aug-1965 (50 y.o. Male) Treating RN: Baruch Gouty, RN, BSN, [place] Primary Care Physician: [name] Other Clinician: Referring Physician: [name] Treating Physician/Extender: Frann Rider in Treatment: 0 Wound Status Wound Number: 1 Primary Diabetic Wound/Ulcer of the Lower Etiology: Extremity Wound Location: Right Foot - Plantar Wound Status: Open Wounding Event: Gradually Appeared Comorbid Hypertension, Type II Diabetes, Date Acquired: 12/30/2014 History: Neuropathy Weeks Of Treatment: 0 Clustered Wound: No Photos Photo Uploaded By: Regan Lemming on 01/07/2015 15:34:41 Wound Measurements Length: (cm) 1 Width: (cm) 1 Depth: (cm) 0.3 Area: (cm) 0.785 Volume: (cm) 0.236 % Reduction in Area: 0% % Reduction in Volume: 0% Epithelialization: None Tunneling: No Undermining: No Wound Description Classification: Grade 1 Foul Odor Aft Wound Margin: Distinct, outline attached Exudate Amount: Small Exudate Type: Serosanguineous Exudate Color: red, brown er Cleansing: No Wound Bed Granulation Amount: Medium (34-66%) Exposed Structure Necrotic Amount: Small (1-33%) Fascia Exposed:  No Necrotic Quality: Adherent Slough Fat Layer Exposed: No Tendon Exposed: No [name], [name] ([id number]) Muscle Exposed: No Joint Exposed: No Bone Exposed: No Limited to Skin Breakdown Periwound Skin Texture Texture Color No Abnormalities Noted: No No Abnormalities Noted: No Callus: No Atrophie Blanche: No Crepitus: No Cyanosis: No Excoriation: No Ecchymosis: No Fluctuance: No Erythema: No Friable: No Hemosiderin Staining: No Induration: No Mottled: No Localized Edema: No Pallor: No Rash: No Rubor: No Scarring: No Temperature / Pain Moisture Temperature: No Abnormality No Abnormalities Noted: No Dry / Scaly: No Maceration: No Moist: Yes Wound Preparation Ulcer Cleansing: Rinsed/Irrigated with Saline Topical Anesthetic Applied: Other: Lidocaine 4%, Treatment Notes Wound #1 (Right, Plantar Foot) 1. Cleansed with: Clean wound with Normal Saline 4. Dressing Applied: Aquacel Ag 5. Secondary Dressing Applied Foam 7. Secured  with Recruitment consultant) Signed: 01/07/2015 3:35:50 PM By: Regan Lemming BSN, RN Entered By: Regan Lemming on 01/07/2015 09:08:42 [name] ([id number]) -------------------------------------------------------------------------------- Vitals Details Patient Name: [name] Date of Service: 01/07/2015 8:45 AM Medical Record Number: [id number] Patient Account Number: [id number] Date of Birth/Sex: [birth year] (50 y.o. Male) Treating RN: Afful, RN, BSN, [place] Primary Care Physician: [name] Other Clinician: Referring Physician: [name] Treating Physician/Extender: Frann Rider in Treatment: 0 Vital Signs Time Taken: 08:56 Temperature (F): 98.1 Height (in): 74 Pulse (bpm): 100 Source: Stated Respiratory Rate (breaths/min): 18 Weight (lbs): 288 Blood Pressure (mmHg): 130/74 Source: Measured Reference Range: 80 - 120 mg / dl Body Mass Index (BMI): 37 Electronic Signature(s) Signed: 01/07/2015 3:35:50 PM By: Regan Lemming BSN, RN Entered By: Regan Lemming on 01/07/2015 08:57:22

## 2015-01-09 NOTE — Progress Notes (Signed)
TALEN, LEMAR ([id number]) Visit Report for 01/07/2015 Chief Complaint Document Details [name] Date of Service: 01/07/2015 8:45 AM Patient Name: [name]. Patient Account Number: [id number] Medical Record Afful, RN, BSN, [id number] Treating RN: Number: Velva Harman Date of Birth/Sex: December 21, 1965 (51 y.o. Male) Other Clinician: Primary Care Physician: [name] Treating Christin Fudge Referring Physician: [name] Physician/Extender: Suella Grove in Treatment: 0 Information Obtained from: Patient Chief Complaint Patients presents for treatment of an open diabetic ulcer on the plantar aspect of the right foot which she's had for about 4 weeks Electronic Signature(s) Signed: 01/07/2015 10:40:44 AM By: Christin Fudge MD, FACS Entered By: Christin Fudge on 01/07/2015 10:40:44 [name] ([id number]) -------------------------------------------------------------------------------- Debridement Details [name] Date of Service: 01/07/2015 8:45 AM Patient Name: [name]. Patient Account Number: [id number] Medical Record Afful, RN, BSN, [id number] Treating RN: Number: Velva Harman Date of Birth/Sex: Jun 02, 1965 (50 y.o. Male) Other Clinician: Primary Care Physician: [name] Treating Quincy Prisco Referring Physician: [name] Physician/Extender: Weeks in Treatment: 0 Debridement Performed for Wound #1 Right,Plantar Foot Assessment: Performed By: Physician Christin Fudge, MD Debridement: Debridement Pre-procedure Yes Verification/Time Out Taken: Start Time: 09:25 Pain Control: Lidocaine 4% Topical Solution Level: Skin/Subcutaneous Tissue Total Area Debrided (L x 1 (cm) x 1 (cm) = 1 (cm) W): Tissue and other Viable, Non-Viable, Fibrin/Slough, Subcutaneous material debrided: Instrument: Curette Bleeding: Minimum Hemostasis Achieved: Pressure End Time: 09:30 Procedural Pain: 0 Post Procedural Pain: 0 Response to Treatment: Procedure was tolerated well Post  Debridement Measurements of Total Wound Length: (cm) 1 Width: (cm) 1 Depth: (cm) 0.3 Volume: (cm) 0.236 Post Procedure Diagnosis Same as Pre-procedure Electronic Signature(s) Signed: 01/07/2015 10:40:21 AM By: Christin Fudge MD, FACS Signed: 01/07/2015 3:35:50 PM By: Regan Lemming BSN, RN Entered By: Christin Fudge on 01/07/2015 10:40:20 [name] ([id number]) -------------------------------------------------------------------------------- HPI Details [name] Date of Service: 01/07/2015 8:45 AM Patient Name: [name]. Patient Account Number: [id number] Medical Record Afful, RN, BSN, [id number] Treating RN: Number: Velva Harman Date of Birth/Sex: [birth year] (50 y.o. Male) Other Clinician: Primary Care Physician: [name] Treating Christin Fudge Referring Physician: [name] Physician/Extender: Weeks in Treatment: 0 History of Present Illness Location: plantar aspect of the right forefoot Quality: Patient reports No Pain. Severity: Patient states wound are getting worse. Duration: Patient has had the wound for < 4 weeks prior to presenting for treatment Timing: he has minimal discharge from the wound Context: The wound appeared gradually over time Modifying Factors: Other treatment(s) tried include:plan local care but not offloading Associated Signs and Symptoms: Patient reports having:no pain or discharge from the wound. HPI Description: this 50 year old male comes with an ulcerated area on the plantar aspect of the right foot which she's had for approximately a month. I have known him from a previous visit at [facility] wound center and was treated in the months of April and May 2016 and rapidly healed a left plantar ulcer with a total contact cast. He has been a diabetic for about 16 years and tries to keep active and is fairly compliant with his diabetes management. 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 Past medical history significant for hypertension,  hyperlipidemia, and status post appendectomy 1993. He does not smoke or drink alcohol. Electronic Signature(s) Signed: 01/07/2015 10:43:30 AM By: Christin Fudge MD, FACS Entered By: Christin Fudge on 01/07/2015 10:43:29 [name] ([id number]) -------------------------------------------------------------------------------- Physical Exam Details [name] Date of Service: 01/07/2015 8:45 AM Patient Name: [name]. Patient Account Number: [id number] Medical Record Afful, RN, BSN, [id number] Treating RN: Number: Velva Harman Date of Birth/Sex: May 29, 1965 (  50 y.o. Male) Other Clinician: Primary Care Physician: [name] Treating Christin Fudge Referring Physician: [name] Physician/Extender: Weeks in Treatment: 0 Constitutional . Pulse regular. Respirations normal and unlabored. Afebrile. . Eyes Nonicteric. Reactive to light. Ears, Nose, Mouth, and Throat Lips, teeth, and gums WNL.Marland Kitchen Moist mucosa without lesions. Neck supple and nontender. No palpable supraclavicular or cervical adenopathy. Normal sized without goiter. Respiratory WNL. No retractions.. Cardiovascular Pedal Pulses WNL. ABI on the left is 1.15 on the right is 1.08. No clubbing, cyanosis or edema. Lymphatic No adneopathy. No adenopathy. No adenopathy. Musculoskeletal Adexa without tenderness or enlargement.. Digits and nails w/o clubbing, cyanosis, infection, petechiae, ischemia, or inflammatory conditions.. Integumentary (Hair, Skin) No suspicious lesions. No crepitus or fluctuance. No peri-wound warmth or erythema. No masses.Marland Kitchen Psychiatric Judgement and insight Intact.. No evidence of depression, anxiety, or agitation.. Notes he has a fairly large plantar diabetic foot ulcer in the region of his fourth and third metatarsal. there is significant callus and subcutaneous debris which has been sharply debrided with a curette to brisk bleeding which was controlled with Silver nitrate. Electronic  Signature(s) Signed: 01/07/2015 10:44:56 AM By: Christin Fudge MD, FACS Entered By: Christin Fudge on 01/07/2015 10:44:56 [name] -------------------------------------------------------------------------------- Physician Orders Details [name] Date of Service: 01/07/2015 8:45 AM Patient Name: [name]. Patient Account Number: [id number] Medical Record Afful, RN, BSN, [id number] Treating RN: Number: Velva Harman Date of Birth/Sex: Mar 07, 1965 (50 y.o. Male) Other Clinician: Primary Care Physician: Fay Records Referring Physician: [name] Physician/Extender: Suella Grove in Treatment: 0 Verbal / Phone Orders: Yes Clinician: Afful, RN, BSN, Rita Read Back and Verified: Yes Diagnosis Coding Wound Cleansing Wound #1 Right,Plantar Foot o Clean wound with Normal Saline. Anesthetic Wound #1 Right,Plantar Foot o Topical Lidocaine 4% cream applied to wound bed prior to debridement Primary Wound Dressing Wound #1 Right,Plantar Foot o Aquacel Ag Dressing Change Frequency Wound #1 Right,Plantar Foot o Other: - on monday for cast change but patient will call to notify wcc if he tolerated it then he can cancel appointment and come in a week Follow-up Appointments Wound #1 [place] o Return Appointment in 1 week. Off-Loading Wound #1 Right,Plantar Foot o Total Contact Cast to Right Lower Extremity Electronic Signature(s) Signed: 01/07/2015 3:35:50 PM By: Regan Lemming BSN, RN Signed: 01/08/2015 5:05:49 PM By: Christin Fudge MD, FACS Entered By: Regan Lemming on 01/07/2015 09:36:31 [name] -------------------------------------------------------------------------------- Problem List Details [name] Date of Service: 01/07/2015 8:45 AM Patient Name: [name]. Patient Account Number: [id number] Medical Record Afful, RN, BSN, [id number] Treating RN: Number: Velva Harman Date of Birth/Sex: 1965/09/02 (50 y.o.  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 Patient Account Number: [id number] Medical Record Afful, RN, BSN, [id number] Treating RN: Number: Velva Harman Date of Birth/Sex: Nov 06, 1965 (50 y.o. Male) Other Clinician: Primary Care Physician: Fay Records Referring Physician: [name] Physician/Extender: Weeks in Treatment: 0 Subjective Chief Complaint Information obtained from Patient Patients presents for  treatment of an open diabetic ulcer on the plantar aspect of the right foot which she's had for about 4 weeks History of Present Illness (HPI) The following HPI elements were documented for the patient's wound: Location: plantar aspect of the right forefoot Quality: Patient reports No Pain. Severity: Patient states wound are getting worse. Duration: Patient has had the wound for < 4 weeks prior to presenting for treatment Timing: he has minimal discharge from the wound Context: The wound appeared gradually over time Modifying Factors: Other treatment(s) tried include:plan local care but not offloading Associated Signs and Symptoms: Patient reports having:no pain or discharge from the  wound. this 50 year old male comes with an ulcerated area on the plantar aspect of the right foot which she's had for approximately a month. I have known him from a previous visit at [facility] wound center and was treated in the months of April and May 2016 and rapidly healed a left plantar ulcer with a total contact cast. He has been a diabetic for about 16 years and tries to keep active and is fairly compliant with his diabetes management. 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 Past medical history significant for hypertension, hyperlipidemia, and status post appendectomy 1993. He does not smoke or drink alcohol. Wound History Patient presents with 1 open wound that has been present for approximately 3weeks. Patient has been treating wound in the following manner: antibiotic ointment. Laboratory tests have not been performed in the last month. Patient reportedly has not tested positive for an antibiotic resistant organism. Patient reportedly has not tested positive for osteomyelitis. Patient reportedly has not had testing performed to evaluate circulation in the legs. Patient History Allergies no known allergies BLAYTON, FARE ([id number]) Family History Cancer - Father, Diabetes - Father, Hypertension - Father, No family history of Heart Disease, Hereditary Spherocytosis, Kidney Disease, Lung Disease, Seizures, Stroke, Thyroid Problems, Tuberculosis. Social History Never smoker, Marital Status - Married, Alcohol Use - Never, Drug Use - No History, Caffeine Use - Daily. Medical History Eyes Denies history of Cataracts, Glaucoma, Optic Neuritis Ear/Nose/Mouth/Throat Denies history of Chronic sinus problems/congestion, Middle ear problems Hematologic/Lymphatic Denies history of Anemia, Hemophilia, Human Immunodeficiency Virus, Lymphedema, Sickle Cell Disease Respiratory Denies history of Aspiration, Asthma, Chronic Obstructive Pulmonary Disease (COPD), Pneumothorax, Sleep  Apnea, Tuberculosis Cardiovascular Patient has history of Hypertension Denies history of Angina, Arrhythmia, Congestive Heart Failure, Coronary Artery Disease, Myocardial Infarction, Peripheral Arterial Disease, Peripheral Venous Disease, Phlebitis, Vasculitis Gastrointestinal Denies history of Cirrhosis , Colitis, Crohn s, Hepatitis A, Hepatitis B, Hepatitis C Endocrine Patient has history of Type II Diabetes Genitourinary Denies history of End Stage Renal Disease Immunological Denies history of Lupus Erythematosus, Raynaud s, Scleroderma Integumentary (Skin) Denies history of History of Burn, History of pressure wounds Musculoskeletal Denies history of Gout, Rheumatoid Arthritis, Osteoarthritis, Osteomyelitis Neurologic Patient has history of Neuropathy Oncologic Denies history of Received Chemotherapy, Received Radiation Psychiatric Denies history of Anorexia/bulimia, Confinement Anxiety Patient is treated with Insulin, Oral Agents. Blood sugar is not tested. Blood sugar results noted at the following times: Breakfast - 141. Review of Systems (ROS) Constitutional Symptoms (General Health) The patient has no complaints or symptoms. Eyes Complains or has symptoms of Glasses / Contacts. WILSON, BRAR ([id number]) Ear/Nose/Mouth/Throat The patient has no complaints or symptoms. Hematologic/Lymphatic The patient has no complaints or symptoms. Respiratory The patient has no complaints or symptoms. Cardiovascular The patient has no complaints or symptoms. Gastrointestinal The patient has no complaints or symptoms. Endocrine The patient has no complaints or symptoms.  Genitourinary The patient has no complaints or symptoms. Immunological The patient has no complaints or symptoms. Integumentary (Skin) Complains or has symptoms of Wounds. Musculoskeletal The patient has no complaints or symptoms. Oncologic The patient has no complaints or symptoms. Psychiatric The  patient has no complaints or symptoms. Medications aspirin 81 mg tablet,delayed release oral 1 1 tablet,delayed release (DR/EC) oral Tribenzor 40 mg-10 mg-25 mg tablet oral 1 1 tablet oral Invokana 300 mg tablet oral 1 1 tablet oral metformin 1,000 mg tablet oral 1 1 tablet oral glimepiride 2 mg tablet oral 1 1 tablet oral Lovaza 1 gram capsule oral 1 1 capsule oral Objective Constitutional Pulse regular. Respirations normal and unlabored. Afebrile. Vitals Time Taken: 8:56 AM, Height: 74 in, Source: Stated, Weight: 288 lbs, Source: Measured, BMI: 37, Temperature: 98.1 F, Pulse: 100 bpm, Respiratory Rate: 18 breaths/min, Blood Pressure: 130/74 mmHg. LAZARUS, JAQUITH ([id number]) Eyes Nonicteric. Reactive to light. Ears, Nose, Mouth, and Throat Lips, teeth, and gums WNL.Marland Kitchen Moist mucosa without lesions. Neck supple and nontender. No palpable supraclavicular or cervical adenopathy. Normal sized without goiter. Respiratory WNL. No retractions.. Cardiovascular Pedal Pulses WNL. ABI on the left is 1.15 on the right is 1.08. No clubbing, cyanosis or edema. Lymphatic No adneopathy. No adenopathy. No adenopathy. Musculoskeletal Adexa without tenderness or enlargement.. Digits and nails w/o clubbing, cyanosis, infection, petechiae, ischemia, or inflammatory conditions.Marland Kitchen Psychiatric Judgement and insight Intact.. No evidence of depression, anxiety, or agitation.. General Notes: he has a fairly large plantar diabetic foot ulcer in the region of his fourth and third metatarsal. there is significant callus and subcutaneous debris which has been sharply debrided with a curette to brisk bleeding which was controlled with Silver nitrate. Integumentary (Hair, Skin) No suspicious lesions. No crepitus or fluctuance. No peri-wound warmth or erythema. No masses.. Wound #1 status is Open. Original cause of wound was Gradually Appeared. The wound is located on the [place]. The wound  measures 1cm length x 1cm width x 0.3cm depth; 0.785cm^2 area and 0.236cm^3 volume. The wound is limited to skin breakdown. There is no tunneling or undermining noted. There is a small amount of serosanguineous drainage noted. The wound margin is distinct with the outline attached to the wound base. There is medium (34-66%) granulation within the wound bed. There is a small (1-33%) amount of necrotic tissue within the wound bed including Adherent Slough. The periwound skin appearance exhibited: Moist. The periwound skin appearance did not exhibit: Callus, Crepitus, Excoriation, Fluctuance, Friable, Induration, Localized Edema, Rash, Scarring, Dry/Scaly, Maceration, Atrophie Blanche, Cyanosis, Ecchymosis, Hemosiderin Staining, Mottled, Pallor, Rubor, Erythema. Periwound temperature was noted as No Abnormality. Assessment AYRTON, ZIMMERLE ([id number]) Active Problems ICD-10 E11.621 - Type 2 diabetes mellitus with foot ulcer L97.512 - 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d L84 - Corns and callosities For some social economic reasons he has not got his diabetic footwear which I had prescribed for him over 6 months ago. I have urged him to be compliant about this and get his papers in order so that he has proper orthotic footwear This time around we will use silver alginate to be packed into the wound a appropriate felt offloading ring and then use a total contact cast. He has been agreeable about this and appropriate application of cast has been done today. He will come for a cast change in about 72 hours and we will see him back regularly until this wound heals. Procedures Wound #1 Wound #1 is a Diabetic  Wound/Ulcer of the Lower Extremity located on the [place] . There was a Skin/Subcutaneous Tissue Debridement [id number]) debridement with total area of 1 sq cm performed by Christin Fudge, MD. with the following instrument(s): Curette to  remove Viable and Non-Viable tissue/material including Fibrin/Slough and Subcutaneous after achieving pain control using Lidocaine 4% Topical Solution. A time out was conducted prior to the start of the procedure. A Minimum amount of bleeding was controlled with Pressure. The procedure was tolerated well with a pain level of 0 throughout and a pain level of 0 following the procedure. Post Debridement Measurements: 1cm length x 1cm width x 0.3cm depth; 0.236cm^3 volume. Post procedure Diagnosis Wound #1: Same as Pre-Procedure Wound #1 is a Diabetic Wound/Ulcer of the Lower Extremity located on the [place] . There was a Total Contact Cast Procedure by Christin Fudge, MD. Post procedure Diagnosis Wound #1: Same as Pre-Procedure Plan CHANNON, AGUDELO ([id number]) Wound Cleansing: Wound #1 Right,Plantar Foot: Clean wound with Normal Saline. Anesthetic: Wound #1 Right,Plantar Foot: Topical Lidocaine 4% cream applied to wound bed prior to debridement Primary Wound Dressing: Wound #1 Right,Plantar Foot: Aquacel Ag Dressing Change Frequency: Wound #1 Right,Plantar Foot: Other: - on monday for cast change but patient will call to notify wcc if he tolerated it then he can cancel appointment and come in a week Follow-up Appointments: Wound #1 Right,Plantar Foot: Return Appointment in 1 week. Off-Loading: Wound #1 Right,Plantar Foot: Total Contact Cast to Right Lower Extremity For some social economic reasons he has not got his diabetic footwear which I had prescribed for him over 6 months ago. I have urged him to be compliant about this and get his papers in order so that he has proper orthotic footwear This time around we will use silver alginate to be packed into the wound a appropriate felt offloading ring and then use a total contact cast. He has been agreeable about this and appropriate application of cast has been done today. He will come for a cast change in about 72  hours and we will see him back regularly until this wound heals. Electronic Signature(s) Signed: 01/07/2015 3:58:41 PM By: Christin Fudge MD, FACS Previous Signature: 01/07/2015 10:47:32 AM Version By: Christin Fudge MD, FACS Previous Signature: 01/07/2015 10:46:36 AM Version By: Christin Fudge MD, FACS Entered By: Christin Fudge on 01/07/2015 15:58:41 [name] ([id number]) -------------------------------------------------------------------------------- ROS/PFSH Details [name] Date of Service: 01/07/2015 8:45 AM Patient Name: [name]. Patient Account Number: [id number] Medical Record Afful, RN, BSN, [id number] Treating RN: Number: Velva Harman Date of Birth/Sex: [birth year] (50 y.o. Male) Other Clinician: Primary Care Physician: [name] Treating Dalani Mette Referring Physician: [name] Physician/Extender: Weeks in Treatment: 0 Wound History Do you currently have one or more open woundso Yes How many open wounds do you currently haveo 1 Approximately how long have you had your woundso 3weeks How have you been treating your wound(s) until nowo antibiotic ointment Has your wound(s) ever healed and then re-openedo No Have you had any lab work done in the past montho No Have you tested positive for an antibiotic resistant organism (MRSA, VRE)o No Have you tested positive for osteomyelitis (bone infection)o No Have you had any tests for circulation on your legso No Eyes Complaints and Symptoms: Positive for: Glasses / Contacts Medical History: Negative for: Cataracts; Glaucoma; Optic Neuritis Integumentary (Skin) Complaints and Symptoms: Positive for: Wounds Medical History: Negative for: History of Burn; History of pressure wounds Constitutional Symptoms (General Health) Complaints and Symptoms: No Complaints or Symptoms  Ear/Nose/Mouth/Throat Complaints and Symptoms: No Complaints or Symptoms Medical History: Negative for: Chronic sinus problems/congestion;  Middle ear problems ZETH, SODERGREN ([id number]) Hematologic/Lymphatic Complaints and Symptoms: No Complaints or Symptoms Medical History: Negative for: Anemia; Hemophilia; Human Immunodeficiency Virus; Lymphedema; Sickle Cell Disease Respiratory Complaints and Symptoms: No Complaints or Symptoms Medical History: Negative for: Aspiration; Asthma; Chronic Obstructive Pulmonary Disease (COPD); Pneumothorax; Sleep Apnea; Tuberculosis Cardiovascular Complaints and Symptoms: No Complaints or Symptoms Medical History: Positive for: Hypertension Negative for: Angina; Arrhythmia; Congestive Heart Failure; Coronary Artery Disease; Myocardial Infarction; Peripheral Arterial Disease; Peripheral Venous Disease; Phlebitis; Vasculitis Gastrointestinal Complaints and Symptoms: No Complaints or Symptoms Medical History: Negative for: Cirrhosis ; Colitis; Crohnos; Hepatitis A; Hepatitis B; Hepatitis C Endocrine Complaints and Symptoms: No Complaints or Symptoms Medical History: Positive for: Type II Diabetes Time with diabetes: 13 years Treated with: Insulin, Oral agents Blood sugar tested every day: No Blood sugar testing results: Breakfast: 141 Genitourinary Complaints and Symptoms: No Complaints or Symptoms ZAKARIYE, PERSAD ([id number]) Medical History: Negative for: End Stage Renal Disease Immunological Complaints and Symptoms: No Complaints or Symptoms Medical History: Negative for: Lupus Erythematosus; Raynaudos; Scleroderma Musculoskeletal Complaints and Symptoms: No Complaints or Symptoms Medical History: Negative for: Gout; Rheumatoid Arthritis; Osteoarthritis; Osteomyelitis Neurologic Medical History: Positive for: Neuropathy Oncologic Complaints and Symptoms: No Complaints or Symptoms Medical History: Negative for: Received Chemotherapy; Received Radiation Psychiatric Complaints and Symptoms: No Complaints or Symptoms Medical History: Negative for:  Anorexia/bulimia; Confinement Anxiety Family and Social History Cancer: Yes - Father; Diabetes: Yes - Father; Heart Disease: No; Hereditary Spherocytosis: No; Hypertension: Yes - Father; Kidney Disease: No; Lung Disease: No; Seizures: No; Stroke: No; Thyroid Problems: No; Tuberculosis: No; Never smoker; Marital Status - Married; Alcohol Use: Never; Drug Use: No History; Caffeine Use: Daily; Financial Concerns: No; Food, Clothing or Shelter Needs: No; Support System Lacking: No; Transportation Concerns: No; Advanced Directives: No; Patient does not want information on Advanced Directives; Living Will: No Physician Affirmation I have reviewed and agree with the above information. MATTHEWJAMES, GRANDBERRY ([id number]) Electronic Signature(s) Signed: 01/07/2015 10:46:51 AM By: Christin Fudge MD, FACS Signed: 01/07/2015 3:35:50 PM By: Regan Lemming BSN, RN Entered By: Christin Fudge on 01/07/2015 10:46:49 [name] ([id number]) -------------------------------------------------------------------------------- Total Contact Cast Details [name] Date of Service: 01/07/2015 8:45 AM Patient Name: [name]. Patient Account Number: [id number] Medical Record Afful, RN, BSN, [id number] Treating RN: Number: Velva Harman Date of Birth/Sex: 05-28-1965 (50 y.o. Male) Other Clinician: Primary Care Physician: [name] Treating Christohper Dube Referring Physician: [name] Physician/Extender: Weeks in Treatment: 0 Total Contact Cast Applied for Wound Wound #1 Right,Plantar Foot Assessment: Performed By: Physician Christin Fudge, MD Post Procedure Diagnosis Same as Pre-procedure Electronic Signature(s) Signed: 01/07/2015 3:35:50 PM By: Regan Lemming BSN, RN Signed: 01/08/2015 5:05:49 PM By: Christin Fudge MD, FACS Entered By: Regan Lemming on 01/07/2015 10:53:47 [name] ([id number]) -------------------------------------------------------------------------------- SuperBill  Details [name] Date of Service: 01/07/2015 Patient Name: [name]. Patient Account Number: [id number] Medical Record Afful, RN, BSN, [id number] Treating RN: Number: Velva Harman Date of Birth/Sex: Mar 31, 1965 (50 y.o. Male) Other Clinician: Primary Care Physician: [name] Treating Christin Fudge Referring Physician: [name] Physician/Extender: Weeks in Treatment: 0 Diagnosis Coding ICD-10 Codes Code Description E11.621 Type 2 diabetes mellitus with foot ulcer L97.512 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d L84 Corns and callosities Facility Procedures CPT4 Code Description: [id number] - WOUND CARE VISIT-LEV 3 EST PT Modifier: Quantity: 1 CPT4 Code Description: [id number] - DEB SUBQ TISSUE  20 SQ CM/< ICD-10 Description Diagnosis E11.621 Type 2 diabetes mellitus with foot ulcer L97.512 Non-pressure chronic ulcer of other part of right foo L84 Corns and callosities Modifier: t with fat lay Quantity: 1 er exposed Physician Procedures CPT4 Code Description: [id number] - WC PHYS LEVEL 4 - EST PT ICD-10 Description Diagnosis E11.621 Type 2 diabetes mellitus with foot ulcer L97.512 Non-pressure chronic ulcer of other part of right fo L84 Corns and callosities Modifier: 25 ot with fat laye Quantity: 1 r exposed CPT4 Code Description: [id number] - WC PHYS SUBQ TISS 20 SQ CM ICD-10 Description Diagnosis E11.621 Type 2 diabetes mellitus with foot ulcer L97.512 Non-pressure chronic ulcer of other part of right fo L84 Corns and callosities TAEO, SHELLHAMMER  ([id number]) Modifier: ot with fat laye Quantity: 1 r exposed Electronic Signature(s) Signed: 01/07/2015 10:48:00 AM By: Christin Fudge MD, FACS Previous Signature: 01/07/2015 10:47:51 AM Version By: Christin Fudge MD, FACS Entered By: Christin Fudge on 01/07/2015 10:48:00

## 2015-01-11 ENCOUNTER — Ambulatory Visit: Payer: Managed Care, Other (non HMO) | Admitting: Surgery

## 2015-01-14 ENCOUNTER — Other Ambulatory Visit: Payer: Self-pay | Admitting: Surgery

## 2015-01-14 ENCOUNTER — Encounter: Payer: Managed Care, Other (non HMO) | Admitting: Surgery

## 2015-01-14 ENCOUNTER — Ambulatory Visit
Admission: RE | Admit: 2015-01-14 | Discharge: 2015-01-14 | Disposition: A | Discharge status: Home / Self Care | Payer: Managed Care, Other (non HMO) | Source: Ambulatory Visit | Attending: Surgery | Admitting: Surgery

## 2015-01-14 DIAGNOSIS — Z01818 Encounter for other preprocedural examination: Secondary | ICD-10-CM | POA: Diagnosis present

## 2015-01-14 DIAGNOSIS — E11621 Type 2 diabetes mellitus with foot ulcer: Secondary | ICD-10-CM | POA: Diagnosis not present

## 2015-01-14 DIAGNOSIS — Z9289 Personal history of other medical treatment: Secondary | ICD-10-CM

## 2015-01-15 ENCOUNTER — Other Ambulatory Visit
Admission: RE | Admit: 2015-01-15 | Discharge: 2015-01-15 | Disposition: A | Discharge status: Home / Self Care | Payer: Managed Care, Other (non HMO) | Source: Ambulatory Visit | Attending: Surgery | Admitting: Surgery

## 2015-01-15 ENCOUNTER — Other Ambulatory Visit: Payer: Self-pay | Admitting: Surgery

## 2015-01-15 DIAGNOSIS — S91301A Unspecified open wound, right foot, initial encounter: Secondary | ICD-10-CM | POA: Insufficient documentation

## 2015-01-15 DIAGNOSIS — L97512 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d: Secondary | ICD-10-CM

## 2015-01-15 DIAGNOSIS — L089 Local infection of the skin and subcutaneous tissue, unspecified: Secondary | ICD-10-CM | POA: Insufficient documentation

## 2015-01-15 NOTE — Progress Notes (Signed)
[name], [name] ([id number]) Visit Report for 01/14/2015 Chief Complaint Document Details [name], [name] 01/14/2015 10:15 Patient Name: Date of Service: [name] Medical Record Patient Account Number: [id number] Number: Treating RN: Montey Hora Date of Birth/Sex: 04-25-1965 (49 y.o. Male) Other Clinician: Primary Care Physician: [name] Treating Christin Fudge Referring Physician: [name] Physician/Extender: Suella Grove in Treatment: 1 Information Obtained from: Patient Chief Complaint Patients presents for treatment of an open diabetic ulcer on the plantar aspect of the right foot which she's had for about 4 weeks Electronic Signature(s) Signed: 01/14/2015 11:03:53 AM By: Christin Fudge MD, FACS Entered By: Christin Fudge on 01/14/2015 11:03:53 [name] -------------------------------------------------------------------------------- Debridement Details [name], [name] 01/14/2015 10:15 Patient Name: Date of Service: [name] Medical Record Patient Account Number: [id number] Number: Treating RN: Montey Hora Date of Birth/Sex: 03-27-1965 (50 y.o. Male) Other Clinician: Primary Care Physician: Paticia Stack, Ahnna Dungan Referring Physician: [name] Physician/Extender: Weeks in Treatment: 1 Debridement Performed for Wound #1 Right,Plantar Foot Assessment: Performed By: Physician Christin Fudge, MD Debridement: Debridement Pre-procedure Yes Verification/Time Out Taken: Start Time: 10:48 Pain Control: Lidocaine 4% Topical Solution Level: Skin/Subcutaneous Tissue Total Area Debrided (L x 1.2 (cm) x 1.2 (cm) = 1.44 (cm) W): Tissue and other Viable, Non-Viable, Exudate, Fibrin/Slough, Subcutaneous material debrided: Instrument: Forceps, Scissors Bleeding: Minimum Hemostasis Achieved: Pressure End Time: 10:52 Procedural Pain: 0 Post Procedural Pain: 0 Response to Treatment: Procedure  was tolerated well Post Debridement Measurements of Total Wound Length: (cm) 1.2 Width: (cm) 1.2 Depth: (cm) 0.5 Volume: (cm) 0.565 Post Procedure Diagnosis Same as Pre-procedure Notes sharp debridement was done with a forceps and scissors and necrotic tissue and an abscess cavity was deroofed and drained. Electronic Signature(s) Signed: 01/14/2015 11:03:45 AM By: Christin Fudge MD, FACS Signed: 01/14/2015 5:05:29 PM By: Mare Ferrari ([id number]) Entered By: Christin Fudge on 01/14/2015 11:03:45 [name], [name] ([id number]) -------------------------------------------------------------------------------- HPI Details [name], [name] 01/14/2015 10:15 Patient Name: Date of Service: [name] Medical Record Patient Account Number: [id number] Number: Treating RN: Montey Hora Date of Birth/Sex: 04-Oct-1965 (50 y.o. Male) Other Clinician: Primary Care Physician: [name] Treating [name] Referring Physician: [name] Physician/Extender: Weeks in Treatment: 1 History of Present Illness Location: plantar aspect of the right forefoot Quality: Patient reports No Pain. Severity: Patient states wound are getting worse. Duration: Patient has had the wound for < 4 weeks prior to presenting for treatment Timing: he has minimal discharge from the wound Context: The wound appeared gradually over time Modifying Factors: Other treatment(s) tried include:plan local care but not offloading Associated Signs and Symptoms: Patient reports having:no pain or discharge from the wound. HPI Description: This 50 year old male comes with an ulcerated area on the plantar aspect of the right foot which she's had for approximately a month. I have known him from a previous visit at [facility] [place] wound center and was treated in the months of April and May 2016 and rapidly healed a left plantar ulcer with a total contact cast. He has been a diabetic for  about 16 years and tries to keep active and is fairly compliant with his diabetes management. 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 Past medical history significant for hypertension, hyperlipidemia, and status post appendectomy 1993. He does not smoke or drink alcohol. 01/14/2015 -- the patient had tolerated his total contact cast very well and had no problems and has had no systemic symptoms. However when his total contact cast was cut open he had excessive amount of  purulent drainage in spite of being on antibiotics. He had had a recent x-ray done in the ER 12/22/2014 which showed IMPRESSION:No evidence of osseous erosion. Known soft tissue ulceration is not well characterized on radiograph. Scattered vascular calcifications seen. his last hemoglobin A1c in December was 7.3. He has been on Augmentin and doxycycline for the last 2 weeks. Electronic Signature(s) Signed: 01/14/2015 11:17:36 AM By: Christin Fudge MD, FACS Previous Signature: 01/14/2015 11:04:39 AM Version By: Christin Fudge MD, FACS Entered By: Christin Fudge on 01/14/2015 11:17:36 [name], [name] ([id number]) -------------------------------------------------------------------------------- Physical Exam Details [name], [name] 01/14/2015 10:15 Patient Name: Date of Service: [name] AM Medical Record Patient Account Number: [id number] Number: Treating RN: Montey Hora Date of Birth/Sex: 06/17/1965 (50 y.o. Male) Other Clinician: Primary Care Physician: [name] Treating Christin Fudge Referring Physician: [name] Physician/Extender: Weeks in Treatment: 1 Constitutional . Pulse regular. Respirations normal and unlabored. Afebrile. . Eyes Nonicteric. Reactive to light. Ears, Nose, Mouth, and Throat Lips, teeth, and gums WNL.Marland Kitchen Moist mucosa without lesions. Neck supple and nontender. No palpable supraclavicular or cervical adenopathy. Normal sized without goiter. Respiratory WNL. No  retractions.. Cardiovascular Pedal Pulses WNL. No clubbing, cyanosis or edema. Lymphatic No adneopathy. No adenopathy. No adenopathy. Musculoskeletal Adexa without tenderness or enlargement.. Digits and nails w/o clubbing, cyanosis, infection, petechiae, ischemia, or inflammatory conditions.. Integumentary (Hair, Skin) No suspicious lesions. No crepitus or fluctuance. No peri-wound warmth or erythema. No masses.Marland Kitchen Psychiatric Judgement and insight Intact.. No evidence of depression, anxiety, or agitation.. Notes the wound now has significant undermining and depth and there is probing down to bone today. On sharply debriding skin and subcutaneous tissue and deroofing the abscess cavity I was able to get frank pus which has been cultured again. Electronic Signature(s) Signed: 01/14/2015 11:19:14 AM By: Christin Fudge MD, FACS Entered By: Christin Fudge on 01/14/2015 11:19:13 [name] ([id number]) -------------------------------------------------------------------------------- Physician Orders Details [name], [name] 01/14/2015 10:15 Patient Name: Date of Service: [name] AM Medical Record Patient Account Number: [id number] Number: Treating RN: Baruch Gouty, RN, BSN, Velva Harman Date of Birth/Sex: 12/13/1965 (50 y.o. Male) Other Clinician: Primary Care Physician: Fay Records Referring Physician: [name] Physician/Extender: Suella Grove in Treatment: 1 Verbal / Phone Orders: Yes Clinician: Afful, RN, BSN, Rita Read Back and Verified: Yes Diagnosis Coding Wound Cleansing Wound #1 Right,Plantar Foot o Cleanse wound with mild soap and water o May Shower, gently pat wound dry prior to applying new dressing. Anesthetic Wound #1 Right,Plantar Foot o Topical Lidocaine 4% cream applied to wound bed prior to debridement Primary Wound Dressing Wound #1 Right,Plantar Foot o Aquacel Ag Secondary Dressing Wound #1 Right,Plantar Foot o Gauze  and Kerlix/Conform Dressing Change Frequency Wound #1 Right,Plantar Foot o Change dressing every day. Follow-up Appointments Wound #1 Right,Plantar Foot o Return Appointment in 1 week. Off-Loading Wound #1 Right,Plantar Foot o Open toe surgical shoe with peg assist. - front offload Medications-please add to medication list. Wound #1 Right,Plantar Foot o P.O. Antibiotics - Ciprofloxacin [name], [name] ([id number]) Consults o Cardiology - EKG for HBO clearance Laboratory o Culture and Sensitivity - right plantar foot Radiology o MRI, lower extremity with contast - right foot. Can be done without contrast. At the discretion of Radiologist o X-ray, Chest - for HBO clearance Patient Medications Allergies: no known allergies Notifications Medication Indication Start End Cipro 01/14/2015 DOSE oral 500 mg tablet - tablet oral bid Electronic Signature(s) Signed: 01/14/2015 11:01:42 AM By: Christin Fudge MD, FACS Entered By: Christin Fudge on 01/14/2015 11:01:41 [name]  [name] ([id number]) -------------------------------------------------------------------------------- Prescription 01/14/2015 Patient Name: [name] Physician: Christin Fudge MD Date of Birth: 29-Aug-1965 NPI#: [id number] Sex: Jerilynn Mages DEA#: [id number] Phone #: [phone] License #: Patient Address: [place], [place] [address] [facility] [address], [place], [place] [address] [phone] Allergies no known allergies Physician's Orders o X-ray, Chest - for HBO clearance Signature(s): Date(s): CAULIN, DALOMBA ([id number]) Prescription 01/14/2015 Patient Name: [name] Physician: Christin Fudge MD Date of Birth: 1965-03-03 NPI#: [id number] Sex: Valeta Harms: [id number] Phone #: [phone] License #: Patient Address: [place], [place] [address] [facility] [address] [place], [place] [address] [phone] Allergies no known allergies Physician's Orders o Cardiology - EKG for HBO clearance Signature(s): Date(s): Electronic Signature(s) Signed: 01/14/2015 4:38:48 PM By: Christin Fudge MD, FACS Entered By: Christin Fudge on 01/14/2015 11:01:49 SHERIDAN, RITCHISON ([id number]) --------------------------------------------------------------------------------  Problem List Details KIJUAN, FULLMER 01/14/2015 10:15 Patient Name: Date of Service: [name] Medical Record Patient Account Number: [id number] Number: Treating RN: Montey Hora Date of Birth/Sex: 06-Mar-1965 (50 y.o. 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 Medical Record Patient Account Number: [id number] Number: Treating RN: Montey Hora Date of Birth/Sex: 05/02/1965 (50 y.o. Male) Other Clinician: Primary Care Physician: Paticia Stack, Jamyrah Saur Referring Physician: [name] Physician/Extender: Weeks in Treatment: 1 Subjective Chief Complaint Information obtained from Patient Patients presents for treatment of an open diabetic ulcer on the plantar aspect of the right foot which she's had for about 4 weeks History of Present Illness (HPI) The following HPI elements were documented for the patient's wound: Location: plantar aspect of the right forefoot Quality: Patient reports No Pain. Severity: Patient states wound are getting worse. Duration: Patient has had the wound for < 4 weeks prior to presenting for treatment Timing: he has minimal discharge from the wound Context: The wound appeared gradually over time Modifying Factors: Other treatment(s) tried include:plan local care but not offloading Associated Signs and Symptoms: Patient reports having:no pain or discharge from the wound. This 50 year old male comes with an ulcerated area on the plantar aspect of the right foot which she's had for approximately a month. I have known him from a previous visit at [facility] wound center and was treated in the months of April and May 2016 and rapidly healed a left plantar ulcer with a total contact cast. He has been a diabetic for about 16 years and tries to keep active and is fairly compliant with his diabetes management. 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 Past medical history significant for hypertension, hyperlipidemia, and status post appendectomy 1993. He does not smoke or drink alcohol. 01/14/2015 -- the patient had tolerated his total contact cast very well and had no problems and has had no systemic symptoms.  However when his total contact cast was cut open he had excessive amount of purulent drainage in spite of being on antibiotics. He had had a recent x-ray done in the ER 12/22/2014 which showed IMPRESSION:No evidence of osseous erosion. Known soft tissue ulceration is not well characterized on radiograph. Scattered  vascular calcifications seen. his last hemoglobin A1c in December was 7.3. He has been on Augmentin and doxycycline for the last 2 weeks. ABRAHM, BLOCKER ([id number]) Objective Constitutional Pulse regular. Respirations normal and unlabored. Afebrile. Vitals Time Taken: 10:21 AM, Height: 74 in, Weight: 288 lbs, BMI: 37, Temperature: 98.3 F, Pulse: 92 bpm, Respiratory Rate: 16 breaths/min, Blood Pressure: 128/70 mmHg. Eyes Nonicteric. Reactive to light. Ears, Nose, Mouth, and Throat Lips, teeth, and gums WNL.Marland Kitchen Moist mucosa without lesions. Neck supple and nontender. No palpable supraclavicular or cervical adenopathy. Normal sized without goiter. Respiratory WNL. No retractions.. Cardiovascular Pedal Pulses WNL. No clubbing, cyanosis or edema. Lymphatic No adneopathy. No adenopathy. No adenopathy. Musculoskeletal Adexa without tenderness or enlargement.. Digits and nails w/o clubbing, cyanosis, infection, petechiae, ischemia, or inflammatory conditions.Marland Kitchen Psychiatric Judgement and insight Intact.. No evidence of depression, anxiety, or agitation.. General Notes: the wound now has significant undermining and depth and there is probing down to bone today. On sharply debriding skin and subcutaneous tissue and deroofing the abscess cavity I was able to get frank pus which has been cultured again. Integumentary (Hair, Skin) No suspicious lesions. No crepitus or fluctuance. No peri-wound warmth or erythema. No masses.. Wound #1 status is Open. Original cause of wound was Gradually Appeared. The wound is located on the [place]. The wound measures 1.2cm length x 1.2cm width x 0.3cm depth; 1.131cm^2 area and 0.339cm^3 volume. The wound is limited to skin breakdown. There is no tunneling or undermining noted. There is a large amount of serosanguineous drainage noted. The wound margin is distinct with the outline attached to the wound base. There is medium (34-66%) granulation  within the wound bed. There is a small SHAHRUKH, HORROCKS. ([id number]) (1-33%) amount of necrotic tissue within the wound bed including Adherent Slough. The periwound skin appearance exhibited: Moist. The periwound skin appearance did not exhibit: Callus, Crepitus, Excoriation, Fluctuance, Friable, Induration, Localized Edema, Rash, Scarring, Dry/Scaly, Maceration, Atrophie Blanche, Cyanosis, Ecchymosis, Hemosiderin Staining, Mottled, Pallor, Rubor, Erythema. Periwound temperature was noted as No Abnormality. General Notes: drainage was cloudy Assessment Active Problems ICD-10 E11.621 - Type 2 diabetes mellitus with foot ulcer L97.512 - 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d L84 - Corns and callosities L02.611 - Cutaneous abscess of right foot This 50 year old diabetic with a Wagner grade 3 diabetic foot ulcer, has progressed with a frank abscess in the subcutaneous tissue in spite of being on 2 antibiotics for the last 2 weeks. After cleaning out the abscess cavity I have recommended an MRI of the right foot. X-rays were normal in the ER on 12/28. I have also recommended silver alginate to be packed into this wound and appropriate padding and daily dressings to be done. We will also give him a Darco front offloading shoe. I have also recommended ciprofloxacin 500 mg twice a day for the next 10 days and we will change his appropriately once the culture reports are back. This stage I believe he's had this problem for over a month and a half and in spite of every good effort has progressed with a frank abscess. Besides local care I have recommended hyperbaric oxygen therapy and I discussed  the risks benefits and alternatives of them in great detail. After answering all his questions he is agreeable to proceed and we will get a chest x-ray, EKG and work with his insurance company for preauthorization. Procedures Wound #1 Wound #1 is a Diabetic  Wound/Ulcer of the Lower Extremity located on the [place] . There was a Skin/Subcutaneous Tissue Debridement [id number]) debridement with total area of 1.44 sq cm performed by Christin Fudge, MD. with the following instrument(s): Forceps and Scissors to remove Viable and Non-Viable tissue/material including Exudate, Fibrin/Slough, and Subcutaneous after achieving pain control using Lidocaine 4% Topical Solution. A time out was conducted prior to the start of the procedure. A CHRISTOPHERJOHN, GHERE ([id number]) Minimum amount of bleeding was controlled with Pressure. The procedure was tolerated well with a pain level of 0 throughout and a pain level of 0 following the procedure. Post Debridement Measurements: 1.2cm length x 1.2cm width x 0.5cm depth; 0.565cm^3 volume. Post procedure Diagnosis Wound #1: Same as Pre-Procedure General Notes: sharp debridement was done with a forceps and scissors and necrotic tissue and an abscess cavity was deroofed and drained.. Plan Wound Cleansing: Wound #1 Right,Plantar Foot: Cleanse wound with mild soap and water May Shower, gently pat wound dry prior to applying new dressing. Anesthetic: Wound #1 Right,Plantar Foot: Topical Lidocaine 4% cream applied to wound bed prior to debridement Primary Wound Dressing: Wound #1 Right,Plantar Foot: Aquacel Ag Secondary Dressing: Wound #1 Right,Plantar Foot: Gauze and Kerlix/Conform Dressing Change Frequency: Wound #1 Right,Plantar Foot: Change dressing every day. Follow-up Appointments: Wound #1 Right,Plantar Foot: Return Appointment in 1 week. Off-Loading: Wound #1 Right,Plantar Foot: Open toe surgical shoe with peg assist. - front offload Medications-please add to medication list.: Wound #1 Right,Plantar Foot: P.O. Antibiotics - Ciprofloxacin Laboratory ordered were: Culture and Sensitivity - right plantar foot Radiology ordered were: MRI, lower extremity with contast - right foot. Can be  done without contrast. At the discretion of Radiologist, X-ray, Chest - for HBO clearance Consults ordered were: Cardiology - EKG for HBO clearance The following medication(s) was prescribed: Cipro oral 500 mg tablet tablet oral bid starting 01/14/2015 MILFORD, STROEBEL ([id number]) This 50 year old diabetic with a Wagner grade 3 diabetic foot ulcer, has progressed with a frank abscess in the subcutaneous tissue in spite of being on 2 antibiotics for the last 2 weeks. After cleaning out the abscess cavity I have recommended an MRI of the right foot. X-rays were normal in the ER on 12/28. I have also recommended silver alginate to be packed into this wound and appropriate padding and daily dressings to be done. We will also give him a Darco front offloading shoe. I have also recommended ciprofloxacin 500 mg twice a day for the next 10 days and we will change his appropriately once the culture reports are back. This stage I believe he's had this problem for over a month and a half and in spite of every good effort has progressed with a frank abscess. Besides local care I have recommended hyperbaric oxygen therapy and I discussed the risks benefits and alternatives of them in great detail. After answering all his questions he is agreeable to proceed and we will get a chest x-ray, EKG and work with his insurance company for preauthorization. Electronic Signature(s) Signed: 01/14/2015 11:22:35 AM By: Christin Fudge MD, FACS Entered By: Christin Fudge on 01/14/2015 11:22:35 [name] -------------------------------------------------------------------------------- SuperBill Details Patient Name: [name] Date of Service: 01/14/2015 Medical Record Number: [id number] Patient Account Number: [id number] Date  of Birth/Sex: 1965-03-15 (50 y.o. Male) Treating RN: Montey Hora Primary Care Physician: [name] Other Clinician: Referring Physician: [name] Treating Physician/Extender: Frann Rider in Treatment: 1 Diagnosis Coding ICD-10 Codes Code Description [id number] Type 2 diabetes mellitus with foot ulcer L97.512 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d L84 Corns and callosities L02.611 Cutaneous abscess of right foot Facility Procedures CPT4 Code Description: [id number] - DEB SUBQ TISSUE 20 SQ CM/< ICD-10 Description Diagnosis E11.621 Type 2 diabetes mellitus with foot ulcer L97.512 Non-pressure chronic ulcer of other part of right fo L84 Corns and callosities L02.611 Cutaneous  abscess of right foot Modifier: ot with fat la Quantity: 1 yer exposed Physician Procedures CPT4 Code Description: [id number] - WC PHYS LEVEL 3 - EST PT ICD-10 Description Diagnosis E11.621 Type 2 diabetes mellitus with foot ulcer L97.512 Non-pressure chronic ulcer of other part of right fo L02.611 Cutaneous abscess of right foot L84 Corns  and callosities Modifier: 25 ot with fat lay Quantity: 1 er exposed CPT4 Code Description: [id number] - WC PHYS SUBQ TISS 20 SQ CM ICD-10 Description Diagnosis E11.621 Type 2 diabetes mellitus with foot ulcer L97.512 Non-pressure chronic ulcer of other part of right fo L84 Corns and callosities L02.611 Cutaneous  abscess of right foot ERCELL, BESHARA ([id number]) Modifier: ot with fat lay Quantity: 1 er exposed Electronic Signature(s) Signed: 01/14/2015 11:22:58 AM By: Christin Fudge MD, FACS Entered By: Christin Fudge on 01/14/2015 11:22:58

## 2015-01-15 NOTE — Progress Notes (Signed)
YARI, MESKE ([id number]) Visit Report for 01/14/2015 HBO Risk Assessment Details ABDOUL, BLACKMON 01/14/2015 10:15 Patient Name: Date of Service: [name] Medical Record Patient Account Number: [id number] Number: Treating RN: Macarthur Critchley Date of Birth/Sex: 1965/08/12 (49 y.o. Male) Other Clinician: Primary Care Physician: [name] Treating Britto, Errol Referring Physician: [name] Physician/Extender: Weeks in Treatment: 1 HBO Risk Assessment Items Answer Barotrauma Risks: Upper Respiratory Infections No Prior Radiation Treatment to Head/Neck No Tracheostomy No Ear problems or surgery (otosclerosis)- Consider pressure Yes equalization tubes Sinus Problems, Sinus Obstruction No Pulmonary Risks: Emphysema No Pneumothorax No Tuberculosis No Other lung problems (COPD with CO2 retention, lesions, surgery) No -Refer to CPGs Congestive heart Failure -Consider holding HBO if ejection No fraction<30% History of smoking No Bullous Disease, Blebs No Other pulmonary abnormalities No Cardiac Risks: Currently seeing a cardiologisto No Pacemaker/AICD No Hypertension Yes Diuretic Used (water pill). If yes, last time taken: No History of prior or current malignancy (Cancer) Surgery No Radiation therapy No Chemotherapy No MARTRAVIOUS, FRYSON ([id number]) Ophthalmic Risks: Optic Neuritis No Cataracts No Myopia No Retinopathy or Retinal Detachment Surgery- Consider pressure No equalization tubes Confinement Anxiety Claustrophobia No Dialysis Dialysis No Any implants; medical or non-medical No Pregnancy No Diabetes if Yes for Diabetes: Insulin-Dependent Long acting insulin- List: Tyler Aas 100units Metformin 1000mg , Glimepiride Oral Hypoglycemic Agent-List: 2mg  HgbA1C within 3 months Yes Seizures Seizures No Currently using these medications: Aspirin Yes Digoxin (CHF patient) No Nitroprusside No Phenothiazine (Thorazine,etc.)  No Prednisone or other steroids No Disulfiram (Antabuse) No Mafenide Acetate (Sulfamylon-burn cream) No Amiodarone No Notes Chest Xray ordered, patient to go get done Electronic Signature(s) Signed: 01/14/2015 4:39:39 PM By: Rebecca Eaton RN, Sendra Entered By: Rebecca Eaton RN, Sendra on 01/14/2015 11:20:47

## 2015-01-15 NOTE — Progress Notes (Signed)
[name], [name] ([id number]) Visit Report for 01/14/2015 Arrival Information Details Patient Name: [name], [name]. Date of Service: 01/14/2015 10:15 AM Medical Record Number: [id number] Patient Account Number: [id number] Date of Birth/Sex: Jul 14, 1965 (50 y.o. Male) Treating RN: Montey Hora Primary Care Physician: [name] Other Clinician: Referring Physician: [name] Treating Physician/Extender: Frann Rider in Treatment: 1 Visit Information History Since Last Visit Added or deleted any medications: No Patient Arrived: Ambulatory Any new allergies or adverse reactions: No Arrival Time: 10:20 Had a fall or experienced change in No Accompanied By: self activities of daily living that may affect Transfer Assistance: None risk of falls: Patient Identification Verified: Yes Signs or symptoms of abuse/neglect since last No Secondary Verification Process Yes visito Completed: Hospitalized since last visit: No Patient Requires Transmission- No Pain Present Now: No Based Precautions: Patient Has Alerts: Yes Patient Alerts: [id number] 7.3 12/2014 Electronic Signature(s) Signed: 01/14/2015 5:05:29 PM By: Montey Hora Entered By: Montey Hora on 01/14/2015 10:21:10 [name] ([id number]) -------------------------------------------------------------------------------- Encounter Discharge Information Details Patient Name: [name] Date of Service: 01/14/2015 10:15 AM Medical Record Number: [id number] Patient Account Number: [id number] Date of Birth/Sex: May 06, 1965 (50 y.o. Male) Treating RN: Montey Hora Primary Care Physician: [name] Other Clinician: Referring Physician: [name] Treating Physician/Extender: Frann Rider in Treatment: 1 Encounter Discharge Information Items Discharge Pain Level: 0 Discharge Condition: Stable Ambulatory Status: Ambulatory Discharge Destination:  Home Transportation: Private Auto Accompanied By: self Schedule Follow-up Appointment: Yes Medication Reconciliation completed and provided to Patient/Care No Sophiah Rolin: Provided on Clinical Summary of Care: 01/14/2015 Form Type Recipient Paper Patient DP Electronic Signature(s) Signed: 01/14/2015 11:33:31 AM By: Ruthine Dose Previous Signature: 01/14/2015 11:33:01 AM Version By: Ruthine Dose Entered By: Ruthine Dose on 01/14/2015 11:33:31 [name] ([id number]) -------------------------------------------------------------------------------- Lower Extremity Assessment Details Patient Name: [name] Date of Service: 01/14/2015 10:15 AM Medical Record Number: [id number] Patient Account Number: [id number] Date of Birth/Sex: [birth year] (50 y.o. Male) Treating RN: Montey Hora Primary Care Physician: [name] Other Clinician: Referring Physician: [name] Treating Physician/Extender: Frann Rider in Treatment: 1 Vascular Assessment Pulses: Posterior Tibial Dorsalis Pedis Palpable: [Right:Yes] Extremity colors, hair growth, and conditions: Extremity Color: [Right:Normal] Hair Growth on Extremity: [Right:Yes] Temperature of Extremity: [Right:Warm] Capillary Refill: [Right:< 3 seconds] Electronic Signature(s) Signed: 01/14/2015 5:05:29 PM By: Montey Hora Entered By: Montey Hora on 01/14/2015 10:36:11 [name] ([id number]) -------------------------------------------------------------------------------- Multi Wound Chart Details Patient Name: [name] Date of Service: 01/14/2015 10:15 AM Medical Record Number: [id number] Patient Account Number: [id number] Date of Birth/Sex: Jun 02, 1965 (50 y.o. Male) Treating RN: Baruch Gouty, RN, BSN, Velva Harman Primary Care Physician: [name] Other Clinician: Referring Physician: [name] Treating Physician/Extender: Frann Rider in Treatment: 1 Vital  Signs Height(in): 74 Pulse(bpm): 92 Weight(lbs): 288 Blood Pressure 128/70 (mmHg): Body Mass Index(BMI): 37 Temperature(F): 98.3 Respiratory Rate 16 (breaths/min): Photos: [1:No Photos] [N/A:N/A] Wound Location: [1:Right Foot - Plantar] [N/A:N/A] Wounding Event: [1:Gradually Appeared] [N/A:N/A] Primary Etiology: [1:Diabetic Wound/Ulcer of the Lower Extremity] [N/A:N/A] Comorbid History: [1:Hypertension, Type II Diabetes, Neuropathy] [N/A:N/A] Date Acquired: [1:12/30/2014] [N/A:N/A] Weeks of Treatment: [1:1] [N/A:N/A] Wound Status: [1:Open] [N/A:N/A] Measurements L x W x D 1.2x1.2x0.3 [N/A:N/A] (cm) Area (cm) : [1:1.131] [N/A:N/A] Volume (cm) : [1:0.339] [N/A:N/A] % Reduction in Area: [1:-44.10%] [N/A:N/A] % Reduction in Volume: -43.60% [N/A:N/A] Classification: [1:Grade 1] [N/A:N/A] Exudate Amount: [1:Medium] [N/A:N/A] Exudate Type: [1:Serosanguineous] [N/A:N/A] Exudate Color: [1:red, brown] [N/A:N/A] Wound Margin: [1:Distinct, outline attached] [N/A:N/A] Granulation Amount: [1:Medium (34-66%)] [N/A:N/A] Necrotic Amount: [1:Small (1-33%)] [N/A:N/A] Exposed  Structures: [1:Fascia: No Fat: No Tendon: No Muscle: No Joint: No Bone: No] [N/A:N/A] Limited to Skin Breakdown Epithelialization: None N/A N/A Periwound Skin Texture: Edema: No N/A N/A Excoriation: No Induration: No Callus: No Crepitus: No Fluctuance: No Friable: No Rash: No Scarring: No Periwound Skin Moist: Yes N/A N/A Moisture: Maceration: No Dry/Scaly: No Periwound Skin Color: Atrophie Blanche: No N/A N/A Cyanosis: No Ecchymosis: No Erythema: No Hemosiderin Staining: No Mottled: No Pallor: No Rubor: No Temperature: No Abnormality N/A N/A Tenderness on No N/A N/A Palpation: Wound Preparation: Ulcer Cleansing: N/A N/A Rinsed/Irrigated with Saline Topical Anesthetic Applied: Other: Lidocaine 4% Treatment Notes Electronic Signature(s) Signed: 01/14/2015 5:20:37 PM By: Regan Lemming BSN,  RN Entered By: Regan Lemming on 01/14/2015 10:48:12 [name] ([id number]) -------------------------------------------------------------------------------- [place] Details Patient Name: [name], [name]. Date of Service: 01/14/2015 10:15 AM Medical Record Number: [id number] Patient Account Number: [id number] Date of Birth/Sex: Mar 14, 1965 (50 y.o. Male) Treating RN: Baruch Gouty, RN, BSN, [place] Primary Care Physician: [name] Other Clinician: Referring Physician: [name] Treating Physician/Extender: Frann Rider in Treatment: 1 Active Inactive HBO Nursing Diagnoses: Anxiety related to feelings of confinement associated with the hyperbaric oxygen chamber Anxiety related to knowledge deficit of hyperbaric oxygen therapy and treatment procedures Discomfort related to temperature and humidity changes inside hyperbaric chamber Potential for barotraumas to ears, sinuses, teeth, and lungs or cerebral gas embolism related to changes in atmospheric pressure inside hyperbaric oxygen chamber Potential for oxygen toxicity seizures related to delivery of 100% oxygen at an increased atmospheric pressure Potential for pulmonary oxygen toxicity related to delivery of 100% oxygen at an increased atmospheric pressure Goals: Barotrauma will be prevented during HBO2 Date Initiated: 01/07/2015 Goal Status: Active Patient and/or family will be able to state/discuss factors appropriate to the management of their disease process during treatment Date Initiated: 01/07/2015 Goal Status: Active Patient will tolerate the hyperbaric oxygen therapy treatment Date Initiated: 01/07/2015 Goal Status: Active Patient will tolerate the internal climate of the chamber Date Initiated: 01/07/2015 Goal Status: Active Patient/caregiver will verbalize understanding of HBO goals, rationale, procedures and potential hazards Date Initiated: 01/07/2015 Goal Status: Active Signs and  symptoms of pulmonary oxygen toxicity will be recognized and promptly addressed Date Initiated: 01/07/2015 Goal Status: Active Signs and symptoms of seizure will be recognized and promptly addressed ; seizing patients will suffer no harm Date Initiated: 01/07/2015 [name] ([id number]) Goal Status: Active Interventions: Administer a five (5) minute air break for patient if signs and symptoms of seizure appear and notify the hyperbaric physician Administer a ten (10) minute air break for patient if signs and symptoms of seizure appear and notify the hyperbaric physician Administer decongestants, per physician orders, prior to HBO2 Administer the correct therapeutic gas delivery based on the patients needs and limitations, per physician order Assess and provide for patientos comfort related to the hyperbaric environment and equalization of middle ear Assess for signs and symptoms related to adverse events, including but not limited to confinement anxiety, pneumothorax, oxygen toxicity and baurotrauma Assess patient for any history of confinement anxiety Assess patient's knowledge and expectations regarding hyperbaric medicine and provide education related to the hyperbaric environment, goals of treatment and prevention of adverse events Implement protocols to decrease risk of pneumothorax in high risk patients Notes: Orientation to the Wound Care Program Nursing Diagnoses: Knowledge deficit related to the wound healing center program Goals: Patient/caregiver will verbalize understanding of the [place] Program Date Initiated: 01/07/2015 Goal Status: Active Interventions: Provide education on orientation  to the wound center Notes: Peripheral Neuropathy Nursing Diagnoses: Knowledge deficit related to disease process and management of peripheral neurovascular dysfunction Potential alteration in peripheral tissue perfusion (select prior to confirmation of  diagnosis) Goals: Patient/caregiver will verbalize understanding of disease process and disease management Date Initiated: 01/07/2015 Goal Status: Active LATAURUS, BRATLAND ([id number]) Interventions: Assess signs and symptoms of neuropathy upon admission and as needed Provide education on Management of Neuropathy and Related Ulcers Provide education on Management of Neuropathy upon discharge from the [facility] for HBO Treatment Activities: Consult for HBO : 01/14/2015 Patient referred for customized footwear/offloading : 01/14/2015 Notes: Wound/Skin Impairment Nursing Diagnoses: Impaired tissue integrity Knowledge deficit related to ulceration/compromised skin integrity Goals: Patient/caregiver will verbalize understanding of skin care regimen Date Initiated: 01/07/2015 Goal Status: Active Ulcer/skin breakdown will have a volume reduction of 30% by week 4 Date Initiated: 01/07/2015 Goal Status: Active Ulcer/skin breakdown will have a volume reduction of 50% by week 8 Date Initiated: 01/07/2015 Goal Status: Active Ulcer/skin breakdown will have a volume reduction of 80% by week 12 Date Initiated: 01/07/2015 Goal Status: Active Ulcer/skin breakdown will heal within 14 weeks Date Initiated: 01/07/2015 Goal Status: Active Interventions: Assess patient/caregiver ability to obtain necessary supplies Assess patient/caregiver ability to perform ulcer/skin care regimen upon admission and as needed Assess ulceration(s) every visit Provide education on ulcer and skin care Treatment Activities: Referred to DME Ashaunte Standley for dressing supplies : 01/14/2015 Skin care regimen initiated : 01/14/2015 DELON, LEN ([id number]) Topical wound management initiated : 01/14/2015 Notes: Electronic Signature(s) Signed: 01/14/2015 5:20:37 PM By: Regan Lemming BSN, RN Entered By: Regan Lemming on 01/14/2015 10:48:02 [name]  ([id number]) -------------------------------------------------------------------------------- Patient/Caregiver Education Details Patient Name: [name] Date of Service: 01/14/2015 10:15 AM Medical Record Number: [id number] Patient Account Number: [id number] Date of Birth/Gender: Oct 25, 1965 (50 y.o. Male) Treating RN: Montey Hora Primary Care Physician: [name] Other Clinician: Referring Physician: [name] Treating Physician/Extender: Frann Rider in Treatment: 1 Education Assessment Education Provided To: Patient Education Topics Provided Hyperbaric Oxygenation: Handouts: Other: by Amado Nash and Dr Con Memos Methods: Explain/Verbal Responses: State content correctly Wound/Skin Impairment: Handouts: Other: wound care as ordered Methods: Demonstration, Explain/Verbal Responses: State content correctly Electronic Signature(s) Signed: 01/14/2015 5:05:29 PM By: Montey Hora Entered By: Montey Hora on 01/14/2015 11:15:39 [name] ([id number]) -------------------------------------------------------------------------------- Wound Assessment Details Patient Name: [name] Date of Service: 01/14/2015 10:15 AM Medical Record Number: [id number] Patient Account Number: [id number] Date of Birth/Sex: 1965-01-18 (50 y.o. Male) Treating RN: Montey Hora Primary Care Physician: [name] Other Clinician: Referring Physician: [name] Treating Physician/Extender: Frann Rider in Treatment: 1 Wound Status Wound Number: 1 Primary Diabetic Wound/Ulcer of the Lower Etiology: Extremity Wound Location: Right Foot - Plantar Wound Status: Open Wounding Event: Gradually Appeared Comorbid Hypertension, Type II Diabetes, Date Acquired: 12/30/2014 History: Neuropathy Weeks Of Treatment: 1 Clustered Wound: No Photos Photo Uploaded By: Montey Hora on 01/14/2015 11:27:45 Wound Measurements Length: (cm)  1.2 Width: (cm) 1.2 Depth: (cm) 0.3 Area: (cm) 1.131 Volume: (cm) 0.339 % Reduction in Area: -44.1% % Reduction in Volume: -43.6% Epithelialization: None Tunneling: No Undermining: No Wound Description Classification: Grade 3 Wagner Verification: Abscess Wound Margin: Distinct, outline attached Exudate Amount: Large Exudate Type: Serosanguineous Exudate Color: red, brown Foul Odor After Cleansing: No Wound Bed Granulation Amount: Medium (34-66%) Exposed Structure Necrotic Amount: Small (1-33%) Fascia Exposed: No Necrotic Quality: Adherent Slough Fat Layer Exposed: No ENIO, VALLEE ([id number]) Tendon Exposed: No Muscle Exposed: No Joint Exposed: No  Bone Exposed: No Limited to Skin Breakdown Periwound Skin Texture Texture Color No Abnormalities Noted: No No Abnormalities Noted: No Callus: No Atrophie Blanche: No Crepitus: No Cyanosis: No Excoriation: No Ecchymosis: No Fluctuance: No Erythema: No Friable: No Hemosiderin Staining: No Induration: No Mottled: No Localized Edema: No Pallor: No Rash: No Rubor: No Scarring: No Temperature / Pain Moisture Temperature: No Abnormality No Abnormalities Noted: No Dry / Scaly: No Maceration: No Moist: Yes Wound Preparation Ulcer Cleansing: Rinsed/Irrigated with Saline Topical Anesthetic Applied: Other: Lidocaine 4%, Assessment Notes drainage was cloudy Treatment Notes Wound #1 (Right, Plantar Foot) 1. Cleansed with: Clean wound with Normal Saline 2. Anesthetic Topical Lidocaine 4% cream to wound bed prior to debridement 4. Dressing Applied: Aquacel Ag 5. Secondary Dressing Applied Gauze and Kerlix/Conform 7. Secured with Tape Notes darco front [facility]) NAHUEL, ENCE ([id number]) Signed: 01/14/2015 5:05:29 PM By: Montey Hora Entered By: Montey Hora on 01/14/2015 11:14:29 [name]  ([id number]) -------------------------------------------------------------------------------- Vitals Details Patient Name: [name] Date of Service: 01/14/2015 10:15 AM Medical Record Number: [id number] Patient Account Number: [id number] Date of Birth/Sex: 1965-09-26 (50 y.o. Male) Treating RN: Montey Hora Primary Care Physician: [name] Other Clinician: Referring Physician: [name] Treating Physician/Extender: Frann Rider in Treatment: 1 Vital Signs Time Taken: 10:21 Temperature (F): 98.3 Height (in): 74 Pulse (bpm): 92 Weight (lbs): 288 Respiratory Rate (breaths/min): 16 Body Mass Index (BMI): 37 Blood Pressure (mmHg): 128/70 Reference Range: 80 - 120 mg / dl Electronic Signature(s) Signed: 01/14/2015 5:05:29 PM By: Montey Hora Entered By: Montey Hora on 01/14/2015 10:21:54

## 2015-01-19 ENCOUNTER — Ambulatory Visit
Admission: RE | Admit: 2015-01-19 | Discharge: 2015-01-19 | Disposition: A | Discharge status: Home / Self Care | Payer: Managed Care, Other (non HMO) | Source: Ambulatory Visit | Attending: Surgery | Admitting: Surgery

## 2015-01-19 DIAGNOSIS — Z048 Encounter for examination and observation for other specified reasons: Secondary | ICD-10-CM | POA: Insufficient documentation

## 2015-01-20 LAB — WOUND CULTURE

## 2015-01-21 ENCOUNTER — Encounter: Payer: Managed Care, Other (non HMO) | Admitting: Surgery

## 2015-01-21 DIAGNOSIS — E11621 Type 2 diabetes mellitus with foot ulcer: Secondary | ICD-10-CM | POA: Diagnosis not present

## 2015-01-22 NOTE — Progress Notes (Signed)
[place], SACK ([id number]) Visit Report for 01/21/2015 Arrival Information Details Patient Name: [name], [name]. Date of Service: 01/21/2015 12:45 PM Medical Record Number: [id number] Patient Account Number: [id number] Date of Birth/Sex: 27-Sep-1965 (50 y.o. Male) Treating RN: Macarthur Critchley Primary Care Physician: [name] Other Clinician: Referring Physician: [name] Treating Physician/Extender: Frann Rider in Treatment: 2 Visit Information History Since Last Visit All ordered tests and consults were No Patient Arrived: Cane completed: Arrival Time: 12:49 Added or deleted any medications: No Accompanied By: self Any new allergies or adverse reactions: No Transfer Assistance: None Had a fall or experienced change in No Patient Identification Verified: Yes activities of daily living that may affect Secondary Verification Process Completed: Yes risk of falls: Patient Requires Transmission-Based No Signs or symptoms of abuse/neglect since last No Precautions: visito Patient Has Alerts: Yes Hospitalized since last visit: No Has Compression in Place as Prescribed: Yes Has Footwear/Offloading in Place as Yes Prescribed: Left: Other:darco Pain Present Now: No Electronic Signature(s) Signed: 01/21/2015 1:58:09 PM By: Rebecca Eaton, RN, Sendra Entered By: Rebecca Eaton RN, Sendra on 01/21/2015 12:50:03 [name] ([id number]) -------------------------------------------------------------------------------- Clinic Level of Care Assessment Details Patient Name: [name] Date of Service: 01/21/2015 12:45 PM Medical Record Number: [id number] Patient Account Number: [id number] Date of Birth/Sex: [birth year] (50 y.o. Male) Treating RN: Macarthur Critchley Primary Care Physician: [name] Other Clinician: Referring Physician: [name] Treating Physician/Extender: Frann Rider in Treatment: 2 Clinic Level of Care  Assessment Items TOOL 4 Quantity Score []  - Use when only an EandM is performed on FOLLOW-UP visit 0 ASSESSMENTS - Nursing Assessment / Reassessment X - Reassessment of Co-morbidities (includes updates in patient status) 1 10 X - Reassessment of Adherence to Treatment Plan 1 5 ASSESSMENTS - Wound and Skin Assessment / Reassessment X - Simple Wound Assessment / Reassessment - one wound 1 5 []  - Complex Wound Assessment / Reassessment - multiple wounds 0 []  - Dermatologic / Skin Assessment (not related to wound area) 0 ASSESSMENTS - Focused Assessment []  - Circumferential Edema Measurements - multi extremities 0 []  - Nutritional Assessment / Counseling / Intervention 0 X - Lower Extremity Assessment (monofilament, tuning fork, pulses) 1 5 []  - Peripheral Arterial Disease Assessment (using hand held doppler) 0 ASSESSMENTS - Ostomy and/or Continence Assessment and Care []  - Incontinence Assessment and Management 0 []  - Ostomy Care Assessment and Management (repouching, etc.) 0 PROCESS - Coordination of Care X - Simple Patient / Family Education for ongoing care 1 15 []  - Complex (extensive) Patient / Family Education for ongoing care 0 []  - Staff obtains Programmer, systems, Records, Test Results / Process Orders 0 []  - Staff telephones HHA, Nursing Homes / Clarify orders / etc 0 []  - Routine Transfer to another Facility (non-emergent condition) 0 [name], [name] ([id number]) []  - Routine Hospital Admission (non-emergent condition) 0 []  - New Admissions / Biomedical engineer / Ordering NPWT, Apligraf, etc. 0 []  - Emergency Hospital Admission (emergent condition) 0 []  - Simple Discharge Coordination 0 []  - Complex (extensive) Discharge Coordination 0 PROCESS - Special Needs []  - Pediatric / Minor Patient Management 0 []  - Isolation Patient Management 0 []  - Hearing / Language / Visual special needs 0 []  - Assessment of Community assistance (transportation, D/C planning, etc.) 0 []  -  Additional assistance / Altered mentation 0 []  - Support Surface(s) Assessment (bed, cushion, seat, etc.) 0 INTERVENTIONS - Wound Cleansing / Measurement X - Simple Wound Cleansing - one wound 1 5 []  - Complex Wound Cleansing -  multiple wounds 0 X - Wound Imaging (photographs - any number of wounds) 1 5 []  - Wound Tracing (instead of photographs) 0 X - Simple Wound Measurement - one wound 1 5 []  - Complex Wound Measurement - multiple wounds 0 INTERVENTIONS - Wound Dressings X - Small Wound Dressing one or multiple wounds 1 10 []  - Medium Wound Dressing one or multiple wounds 0 []  - Large Wound Dressing one or multiple wounds 0 []  - Application of Medications - topical 0 []  - Application of Medications - injection 0 INTERVENTIONS - Miscellaneous []  - External ear exam 0 [name], [name] ([id number]) []  - Specimen Collection (cultures, biopsies, blood, body fluids, etc.) 0 []  - Specimen(s) / Culture(s) sent or taken to Lab for analysis 0 []  - Patient Transfer (multiple staff / Harrel Lemon Lift / Similar devices) 0 []  - Simple Staple / Suture removal (25 or less) 0 []  - Complex Staple / Suture removal (26 or more) 0 []  - Hypo / Hyperglycemic Management (close monitor of Blood Glucose) 0 []  - Ankle / Brachial Index (ABI) - do not check if billed separately 0 X - Vital Signs 1 5 Has the patient been seen at the hospital within the last three years: Yes Total Score: 70 Level Of Care: New/Established - Level 2 Electronic Signature(s) Signed: 01/21/2015 1:58:09 PM By: Rebecca Eaton, RN, Sendra Entered By: Rebecca Eaton RN, Sendra on 01/21/2015 13:07:29 [name] ([id number]) -------------------------------------------------------------------------------- Encounter Discharge Information Details Patient Name: [name] Date of Service: 01/21/2015 12:45 PM Medical Record Number: [id number] Patient Account Number: [id number] Date of Birth/Sex: Nov 01, 1965 (50 y.o. Male) Treating RN:  Macarthur Critchley Primary Care Physician: [name] Other Clinician: Referring Physician: [name] Treating Physician/Extender: Frann Rider in Treatment: 2 Encounter Discharge Information Items Discharge Pain Level: 0 Discharge Condition: Stable Ambulatory Status: Cane Discharge Destination: Home Transportation: Private Auto Accompanied By: self Schedule Follow-up Appointment: Yes Medication Reconciliation completed Yes and provided to Patient/Care Vieno Tarrant: Provided on Clinical Summary of Care: 01/21/2015 Form Type Recipient Paper Patient DP Electronic Signature(s) Signed: 01/21/2015 1:13:38 PM By: Ruthine Dose Entered By: Ruthine Dose on 01/21/2015 13:13:38 [name] ([id number]) -------------------------------------------------------------------------------- Lower Extremity Assessment Details Patient Name: [name] Date of Service: 01/21/2015 12:45 PM Medical Record Number: [id number] Patient Account Number: [id number] Date of Birth/Sex: 21-Aug-1965 (50 y.o. Male) Treating RN: Macarthur Critchley Primary Care Physician: [name] Other Clinician: Referring Physician: [name] Treating Physician/Extender: Frann Rider in Treatment: 2 Edema Assessment Assessed: [Left: No] [Right: No] Edema: [Left: N] [Right: o] Vascular Assessment Pulses: Posterior Tibial Dorsalis Pedis Palpable: [Right:Yes] Extremity colors, hair growth, and conditions: Extremity Color: [Right:Normal] Hair Growth on Extremity: [Right:Yes] Temperature of Extremity: [Right:Warm] Capillary Refill: [Right:< 3 seconds] Toe Nail Assessment Left: Right: Thick: No Discolored: No Deformed: No Improper Length and Hygiene: No Electronic Signature(s) Signed: 01/21/2015 1:58:09 PM By: Rebecca Eaton, RN, Sendra Entered By: Rebecca Eaton RN, Sendra on 01/21/2015 12:54:37 [name]  ([id number]) -------------------------------------------------------------------------------- Multi Wound Chart Details Patient Name: [name] Date of Service: 01/21/2015 12:45 PM Medical Record Number: [id number] Patient Account Number: [id number] Date of Birth/Sex: Nov 19, 1965 (50 y.o. Male) Treating RN: Macarthur Critchley Primary Care Physician: [name] Other Clinician: Referring Physician: [name] Treating Physician/Extender: Frann Rider in Treatment: 2 Vital Signs Height(in): 74 Pulse(bpm): 84 Weight(lbs): 288 Blood Pressure 134/64 (mmHg): Body Mass Index(BMI): 37 Temperature(F): 98.0 Respiratory Rate (breaths/min): Photos: [1:No Photos] [N/A:N/A] Wound Location: [1:Right Foot - Plantar] [N/A:N/A] Wounding Event: [1:Gradually Appeared] [N/A:N/A] Primary Etiology: [1:Diabetic Wound/Ulcer  of the Lower Extremity] [N/A:N/A] Comorbid History: [1:Hypertension, Type II Diabetes, Neuropathy] [N/A:N/A] Date Acquired: [1:12/30/2014] [N/A:N/A] Weeks of Treatment: [1:2] [N/A:N/A] Wound Status: [1:Open] [N/A:N/A] Measurements L x W x D 2x1.8x0.2 [N/A:N/A] (cm) Area (cm) : [1:2.827] [N/A:N/A] Volume (cm) : [1:0.565] [N/A:N/A] % Reduction in Area: [1:-260.10%] [N/A:N/A] % Reduction in Volume: -139.40% [N/A:N/A] Classification: [1:Grade 3] [N/A:N/A] Wagner Verification: [1:Abscess] [N/A:N/A] Exudate Amount: [1:Large] [N/A:N/A] Exudate Type: [1:Serosanguineous] [N/A:N/A] Exudate Color: [1:red, brown] [N/A:N/A] Wound Margin: [1:Distinct, outline attached] [N/A:N/A] Granulation Amount: [1:Medium (34-66%)] [N/A:N/A] Necrotic Amount: [1:Small (1-33%)] [N/A:N/A] Exposed Structures: [1:Fascia: No Fat: No Tendon: No Muscle: No Joint: No Bone: No] [N/A:N/A] Limited to Skin Breakdown Epithelialization: None N/A N/A Periwound Skin Texture: Callus: Yes N/A N/A Edema: No Excoriation: No Induration: No Crepitus: No Fluctuance: No Friable: No Rash:  No Scarring: No Periwound Skin Maceration: No N/A N/A Moisture: Moist: No Dry/Scaly: No Periwound Skin Color: Atrophie Blanche: No N/A N/A Cyanosis: No Ecchymosis: No Erythema: No Hemosiderin Staining: No Mottled: No Pallor: No Rubor: No Temperature: No Abnormality N/A N/A Tenderness on No N/A N/A Palpation: Wound Preparation: Ulcer Cleansing: N/A N/A Rinsed/Irrigated with Saline Topical Anesthetic Applied: Other: Lidocaine 4% Treatment Notes Electronic Signature(s) Signed: 01/21/2015 1:58:09 PM By: Rebecca Eaton, RN, Sendra Entered By: Rebecca Eaton RN, Sendra on 01/21/2015 13:03:30 [name] ([id number]) -------------------------------------------------------------------------------- Multi-Disciplinary Care Plan Details Patient Name: [name], [name]. Date of Service: 01/21/2015 12:45 PM Medical Record Number: [id number] Patient Account Number: [id number] Date of Birth/Sex: 1965/10/19 (50 y.o. Male) Treating RN: Macarthur Critchley Primary Care Physician: [name] Other Clinician: Referring Physician: [name] Treating Physician/Extender: Frann Rider in Treatment: 2 Active Inactive HBO Nursing Diagnoses: Anxiety related to feelings of confinement associated with the hyperbaric oxygen chamber Anxiety related to knowledge deficit of hyperbaric oxygen therapy and treatment procedures Discomfort related to temperature and humidity changes inside hyperbaric chamber Potential for barotraumas to ears, sinuses, teeth, and lungs or cerebral gas embolism related to changes in atmospheric pressure inside hyperbaric oxygen chamber Potential for oxygen toxicity seizures related to delivery of 100% oxygen at an increased atmospheric pressure Potential for pulmonary oxygen toxicity related to delivery of 100% oxygen at an increased atmospheric pressure Goals: Barotrauma will be prevented during HBO2 Date Initiated: 01/07/2015 Goal Status:  Active Patient and/or family will be able to state/discuss factors appropriate to the management of their disease process during treatment Date Initiated: 01/07/2015 Goal Status: Active Patient will tolerate the hyperbaric oxygen therapy treatment Date Initiated: 01/07/2015 Goal Status: Active Patient will tolerate the internal climate of the chamber Date Initiated: 01/07/2015 Goal Status: Active Patient/caregiver will verbalize understanding of HBO goals, rationale, procedures and potential hazards Date Initiated: 01/07/2015 Goal Status: Active Signs and symptoms of pulmonary oxygen toxicity will be recognized and promptly addressed Date Initiated: 01/07/2015 Goal Status: Active Signs and symptoms of seizure will be recognized and promptly addressed ; seizing patients will suffer no harm Date Initiated: 01/07/2015 [name] ([id number]) Goal Status: Active Interventions: Administer a five (5) minute air break for patient if signs and symptoms of seizure appear and notify the hyperbaric physician Administer a ten (10) minute air break for patient if signs and symptoms of seizure appear and notify the hyperbaric physician Administer decongestants, per physician orders, prior to HBO2 Administer the correct therapeutic gas delivery based on the patients needs and limitations, per physician order Assess and provide for patientos comfort related to the hyperbaric environment and equalization of middle ear Assess for signs and symptoms related to adverse events, including but  not limited to confinement anxiety, pneumothorax, oxygen toxicity and baurotrauma Assess patient for any history of confinement anxiety Assess patient's knowledge and expectations regarding hyperbaric medicine and provide education related to the hyperbaric environment, goals of treatment and prevention of adverse events Implement protocols to decrease risk of pneumothorax in high risk patients Notes: Orientation  to the Wound Care Program Nursing Diagnoses: Knowledge deficit related to the wound healing center program Goals: Patient/caregiver will verbalize understanding of the [facility] Program Date Initiated: 01/07/2015 Goal Status: Active Interventions: Provide education on orientation to the wound center Notes: Peripheral Neuropathy Nursing Diagnoses: Knowledge deficit related to disease process and management of peripheral neurovascular dysfunction Potential alteration in peripheral tissue perfusion (select prior to confirmation of diagnosis) Goals: Patient/caregiver will verbalize understanding of disease process and disease management Date Initiated: 01/07/2015 Goal Status: Active [name], [name] ([id number]) Interventions: Assess signs and symptoms of neuropathy upon admission and as needed Provide education on Management of Neuropathy and Related Ulcers Provide education on Management of Neuropathy upon discharge from the [facility] for HBO Treatment Activities: Consult for HBO : 01/21/2015 Patient referred for customized footwear/offloading : 01/21/2015 Notes: Wound/Skin Impairment Nursing Diagnoses: Impaired tissue integrity Knowledge deficit related to ulceration/compromised skin integrity Goals: Patient/caregiver will verbalize understanding of skin care regimen Date Initiated: 01/07/2015 Goal Status: Active Ulcer/skin breakdown will have a volume reduction of 30% by week 4 Date Initiated: 01/07/2015 Goal Status: Active Ulcer/skin breakdown will have a volume reduction of 50% by week 8 Date Initiated: 01/07/2015 Goal Status: Active Ulcer/skin breakdown will have a volume reduction of 80% by week 12 Date Initiated: 01/07/2015 Goal Status: Active Ulcer/skin breakdown will heal within 14 weeks Date Initiated: 01/07/2015 Goal Status: Active Interventions: Assess patient/caregiver ability to obtain necessary supplies Assess patient/caregiver ability to  perform ulcer/skin care regimen upon admission and as needed Assess ulceration(s) every visit Provide education on ulcer and skin care Treatment Activities: Referred to DME Leylah Tarnow for dressing supplies : 01/21/2015 Skin care regimen initiated : 01/21/2015 [name], [name] ([id number]) Topical wound management initiated : 01/21/2015 Notes: Electronic Signature(s) Signed: 01/21/2015 1:58:09 PM By: Rebecca Eaton RN, Sendra Entered By: Rebecca Eaton RN, Sendra on 01/21/2015 13:03:19 [name] ([id number]) -------------------------------------------------------------------------------- Pain Assessment Details Patient Name: [name] Date of Service: 01/21/2015 12:45 PM Medical Record Number: [id number] Patient Account Number: [id number] Date of Birth/Sex: 1965-10-16 (50 y.o. Male) Treating RN: Macarthur Critchley Primary Care Physician: [name] Other Clinician: Referring Physician: [name] Treating Physician/Extender: Frann Rider in Treatment: 2 Active Problems Location of Pain Severity and Description of Pain Patient Has Paino No Site Locations Rate the pain. Current Pain Level: 0 Pain Management and Medication Current Pain Management: Electronic Signature(s) Signed: 01/21/2015 1:58:09 PM By: Rebecca Eaton, RN, Sendra Entered By: Rebecca Eaton RN, Sendra on 01/21/2015 12:50:12 [name] ([id number]) -------------------------------------------------------------------------------- Patient/Caregiver Education Details Patient Name: [name] Date of Service: 01/21/2015 12:45 PM Medical Record Number: [id number] Patient Account Number: [id number] Date of Birth/Gender: [birth year] (50 y.o. Male) Treating RN: Macarthur Critchley Primary Care Physician: [name] Other Clinician: Referring Physician: [name] Treating Physician/Extender: Frann Rider in Treatment: 2 Education Assessment Education Provided  To: Patient Education Topics Provided Hyperbaric Oxygenation: Handouts: Hyperbaric Oxygen, Other: importance of and insurance Methods: Explain/Verbal Responses: State content correctly Wound/Skin Impairment: Handouts: Caring for Your Ulcer, Skin Care Do's and Dont's Methods: Explain/Verbal Responses: State content correctly Electronic Signature(s) Signed: 01/21/2015 1:58:09 PM By: Rebecca Eaton, RN, Sendra Entered By: Rebecca Eaton RN, Sendra on 01/21/2015 13:11:48 [name] ([id number]) -------------------------------------------------------------------------------- Wound Assessment Details Patient Name: [name], [name]. Date of Service: 01/21/2015 12:45 PM Medical Record Number: [id number] Patient Account Number: [id number] Date of Birth/Sex: 1965/04/06 (50 y.o. Male) Treating RN: Macarthur Critchley Primary Care Physician: [name] Other Clinician: Referring Physician: [name] Treating Physician/Extender: Frann Rider in Treatment: 2 Wound Status Wound Number: 1 Primary Diabetic Wound/Ulcer of the Lower Etiology: Extremity Wound Location: Right Foot - Plantar Wound Status: Open Wounding Event: Gradually Appeared Comorbid Hypertension, Type II Diabetes, Date Acquired: 12/30/2014 History: Neuropathy Weeks Of Treatment: 2 Clustered Wound: No Photos Photo Uploaded By: Regan Lemming on 01/21/2015 16:27:20 Wound Measurements Length: (cm) 2 Width: (cm) 1.8 Depth: (cm) 0.2 Area: (cm) 2.827 Volume: (cm) 0.565 % Reduction in Area: -260.1% % Reduction in Volume: -139.4% Epithelialization: None Tunneling: No Undermining: No Wound Description Classification: Grade 3 Wagner Verification: Abscess Wound Margin: Distinct, outline attached Exudate Amount: Large Exudate Type: Serosanguineous Exudate Color: red, brown Foul Odor After Cleansing: No Wound Bed Granulation Amount: Medium (34-66%) Exposed Structure Necrotic Amount: Small  (1-33%) Fascia Exposed: No Necrotic Quality: Adherent Slough Fat Layer Exposed: No [name], [name] ([id number]) Tendon Exposed: No Muscle Exposed: No Joint Exposed: No Bone Exposed: No Limited to Skin Breakdown Periwound Skin Texture Texture Color No Abnormalities Noted: No No Abnormalities Noted: No Callus: Yes Atrophie Blanche: No Crepitus: No Cyanosis: No Excoriation: No Ecchymosis: No Fluctuance: No Erythema: No Friable: No Hemosiderin Staining: No Induration: No Mottled: No Localized Edema: No Pallor: No Rash: No Rubor: No Scarring: No Temperature / Pain Moisture Temperature: No Abnormality No Abnormalities Noted: No Dry / Scaly: No Maceration: No Moist: No Wound Preparation Ulcer Cleansing: Rinsed/Irrigated with Saline Topical Anesthetic Applied: Other: Lidocaine 4%, Treatment Notes Wound #1 (Right, Plantar Foot) 1. Cleansed with: Clean wound with Normal Saline 2. Anesthetic Topical Lidocaine 4% cream to wound bed prior to debridement 4. Dressing Applied: Aquacel Ag 5. Secondary Dressing Applied ABD and Kerlix/Conform Notes darco front Dietitian) Signed: 01/21/2015 1:58:09 PM By: Rebecca Eaton, RN, Sendra Entered By: Rebecca Eaton RN, Sendra on 01/21/2015 12:56:16 [name] ([id number]) -------------------------------------------------------------------------------- Vitals Details Patient Name: [name] Date of Service: 01/21/2015 12:45 PM Medical Record Number: [id number] Patient Account Number: [id number] Date of Birth/Sex: [birth year] (50 y.o. Male) Treating RN: Macarthur Critchley Primary Care Physician: [name] Other Clinician: Referring Physician: [name] Treating Physician/Extender: Frann Rider in Treatment: 2 Vital Signs Time Taken: 12:51 Temperature (F): 98.0 Height (in): 74 Pulse (bpm): 84 Weight (lbs): 288 Blood Pressure (mmHg): 134/64 Body Mass Index (BMI):  37 Reference Range: 80 - 120 mg / dl Electronic Signature(s) Signed: 01/21/2015 1:58:09 PM By: Rebecca Eaton RN, Sendra Entered By: Rebecca Eaton RN, Sendra on 01/21/2015 13:00:03

## 2015-01-22 NOTE — Progress Notes (Signed)
KEYNON, THUL ([id number]) Visit Report for 01/21/2015 Chief Complaint Document Details CINDY, AUSTGEN 01/21/2015 12:45 Patient Name: Date of Service: [name] Medical Record Patient Account Number: [id number] Number: Treating RN: Afful, RN, BSN, Velva Harman Date of Birth/Sex: May 08, 1965 (50 y.o. Male) Other Clinician: Primary Care Physician: [name] Treating Christin Fudge Referring Physician: [name] Physician/Extender: Weeks in Treatment: 2 Information Obtained from: Patient Chief Complaint Patients presents for treatment of an open diabetic ulcer on the plantar aspect of the right foot which she's had for about 4 weeks Electronic Signature(s) Signed: 01/21/2015 1:35:36 PM By: Christin Fudge MD, FACS Entered By: Christin Fudge on 01/21/2015 13:35:36 [name] ([id number]) -------------------------------------------------------------------------------- HPI Details LEIGH, ANASTACIO 01/21/2015 12:45 Patient Name: Date of Service: [name] Medical Record Patient Account Number: [id number] Number: Treating RN: Afful, RN, BSN, Velva Harman Date of Birth/Sex: 1965/02/01 (50 y.o. Male) Other Clinician: Primary Care Physician: [name] Treating Christin Fudge Referring Physician: [name] Physician/Extender: Weeks in Treatment: 2 History of Present Illness Location: plantar aspect of the right forefoot Quality: Patient reports No Pain. Severity: Patient states wound are getting worse. Duration: Patient has had the wound for < 4 weeks prior to presenting for treatment Timing: he has minimal discharge from the wound Context: The wound appeared gradually over time Modifying Factors: Other treatment(s) tried include:plan local care but not offloading Associated Signs and Symptoms: Patient reports having:no pain or discharge from the wound. HPI Description: This 50 year old male comes with an ulcerated area on the plantar aspect of  the right foot which she's had for approximately a month. I have known him from a previous visit at [facility] wound center and was treated in the months of April and May 2016 and rapidly healed a left plantar ulcer with a total contact cast. He has been a diabetic for about 16 years and tries to keep active and is fairly compliant with his diabetes management. 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 Past medical history significant for hypertension, hyperlipidemia, and status post appendectomy 1993. He does not smoke or drink alcohol. 01/14/2015 -- the patient had tolerated his total contact cast very well and had no problems and has had no systemic symptoms. However when his total contact cast was cut open he had excessive amount of purulent drainage in spite of being on antibiotics. He had had a recent x-ray done in the ER 12/22/2014 which showed IMPRESSION:No evidence of osseous erosion. Known soft tissue ulceration is not well characterized on radiograph. Scattered vascular calcifications seen. his last hemoglobin A1c in December was 7.3. He has been on Augmentin and doxycycline for the last 2 weeks. 01/21/2015 -- his culture grew rare growth of Pantoea species an MR moderate growth of Candida parapsilosis. it is sensitive to levofloxacin. He has not heard back from the insurance company regarding his hyperbaric oxygen therapy. His MRI has not been done yet and we will try and get him an earlier date Electronic Signature(s) Signed: 01/21/2015 1:36:07 PM By: Christin Fudge MD, FACS Previous Signature: 01/21/2015 12:32:27 PM Version By: Christin Fudge MD, FACS Entered By: Christin Fudge on 01/21/2015 13:36:07 IKEEM, SCHOENIG ([id number]) -------------------------------------------------------------------------------- Physical Exam Details CHESLEY, FERRARE 01/21/2015 12:45 Patient Name: Date of Service: [name] Medical Record Patient Account Number:  [id number] Number: Treating RN: Baruch Gouty, RN, BSN, Velva Harman Date of Birth/Sex: July 07, 1965 (50 y.o. Male) Other Clinician: Primary Care Physician: [name] Treating Christin Fudge Referring Physician: [name] Physician/Extender: Weeks in Treatment: 2 Constitutional . Pulse regular. Respirations normal and  unlabored. Afebrile. . Eyes Nonicteric. Reactive to light. Ears, Nose, Mouth, and Throat Lips, teeth, and gums WNL.Marland Kitchen Moist mucosa without lesions. Neck supple and nontender. No palpable supraclavicular or cervical adenopathy. Normal sized without goiter. Respiratory WNL. No retractions.. Cardiovascular Pedal Pulses WNL. No clubbing, cyanosis or edema. Lymphatic No adneopathy. No adenopathy. No adenopathy. Musculoskeletal Adexa without tenderness or enlargement.. Digits and nails w/o clubbing, cyanosis, infection, petechiae, ischemia, or inflammatory conditions.. Integumentary (Hair, Skin) No suspicious lesions. No crepitus or fluctuance. No peri-wound warmth or erythema. No masses.Marland Kitchen Psychiatric Judgement and insight Intact.. No evidence of depression, anxiety, or agitation.. Notes the undermining has decreased but it shows probes down to bone and other than that the surrounding inflammation seems to be better. There was no large abscess cavity and no pus drainage. Electronic Signature(s) Signed: 01/21/2015 1:36:53 PM By: Christin Fudge MD, FACS Entered By: Christin Fudge on 01/21/2015 13:36:52 SKIPPER, MACHIN ([id number]) -------------------------------------------------------------------------------- Physician Orders Details NOWELL, SALKOWSKI 01/21/2015 12:45 Patient Name: Date of Service: [name] Medical Record Patient Account Number: [id number] Number: Treating RN: Macarthur Critchley Date of Birth/Sex: [birth year] (50 y.o. Male) Other Clinician: Primary Care Physician: [name] Treating Christin Fudge Referring Physician: [name] Physician/Extender: Suella Grove in Treatment: 2 Verbal / Phone Orders: Yes Clinician: Macarthur Critchley Read Back and Verified: Yes Diagnosis Coding Wound Cleansing Wound #1 Right,Plantar Foot o Cleanse wound with mild soap and water o May Shower, gently pat wound dry prior to applying new dressing. Anesthetic Wound #1 Right,Plantar Foot o Topical Lidocaine 4% cream applied to wound bed prior to debridement Primary Wound Dressing Wound #1 Right,Plantar Foot o Aquacel Ag Secondary Dressing Wound #1 Right,Plantar Foot o Gauze and Kerlix/Conform Dressing Change Frequency Wound #1 Right,Plantar Foot o Change dressing every day. Follow-up Appointments Wound #1 Right,Plantar Foot o Return Appointment in 1 week. Off-Loading Wound #1 Right,Plantar Foot o Open toe surgical shoe with peg assist. - front offload Medications-please add to medication list. Wound #1 Right,Plantar Foot o P.O. Antibiotics - Ciprofloxacin o P.O. Antibiotics - Diflucan JASSEN, FENZEL ([id number]) Patient Medications Allergies: no known allergies Notifications Medication Indication Start End Diflucan 01/21/2015 DOSE oral 100 mg tablet - tablet oral oral daily Electronic Signature(s) Signed: 01/21/2015 1:35:11 PM By: Christin Fudge MD, FACS Entered By: Christin Fudge on 01/21/2015 13:35:11 [name] ([id number]) -------------------------------------------------------------------------------- Problem List Details JADE, LANDSBERG 01/21/2015 12:45 Patient Name: Date of Service: [name] Medical Record Patient Account Number: [id number] Number: Treating RN: Afful, RN, BSN, Velva Harman Date of Birth/Sex: 27-Aug-1965 (50 y.o. 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 Medical Record Patient Account Number: [id number] Number: Treating RN: Afful, RN, BSN, Velva Harman Date of Birth/Sex: [birth year] (50 y.o. Male) Other Clinician: Primary Care Physician: [name] Treating Christin Fudge Referring Physician: [name] Physician/Extender: Suella Grove in Treatment: 2 Subjective Chief Complaint Information obtained from Patient Patients presents for treatment of an open diabetic ulcer  on the plantar aspect of the right foot which she's had for about 4 weeks History of Present Illness (HPI) The following HPI elements were documented for the patient's wound: Location: plantar aspect of the right forefoot Quality: Patient reports No Pain. Severity: Patient states wound are getting worse. Duration: Patient has had the wound for < 4 weeks prior to presenting for treatment Timing: he has minimal discharge from the wound Context: The wound appeared gradually over time Modifying Factors: Other treatment(s) tried include:plan local care but not offloading Associated Signs and Symptoms: Patient reports having:no pain or discharge from the wound. This 50 year old male comes with an ulcerated area on the plantar aspect of the right foot which she's had for approximately a month. I have known him from a previous  visit at [facility] wound center and was treated in the months of April and May 2016 and rapidly healed a left plantar ulcer with a total contact cast. He has been a diabetic for about 16 years and tries to keep active and is fairly compliant with his diabetes management. 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 Past medical history significant for hypertension, hyperlipidemia, and status post appendectomy 1993. He does not smoke or drink alcohol. 01/14/2015 -- the patient had tolerated his total contact cast very well and had no problems and has had no systemic symptoms. However when his total contact cast was cut open he had excessive amount of purulent drainage in spite of being on antibiotics. He had had a recent x-ray done in the ER 12/22/2014 which showed IMPRESSION:No evidence of osseous erosion. Known soft tissue ulceration is not well characterized on radiograph. Scattered vascular calcifications seen. his last hemoglobin A1c in December was 7.3. He has been on Augmentin and doxycycline for the last 2 weeks. 01/21/2015 -- his culture grew rare growth of Pantoea species an MR moderate growth of Candida parapsilosis. it is sensitive to levofloxacin. He has not heard back from the insurance company regarding his hyperbaric oxygen therapy. LADANIAN, GUZOWSKI ([id number]) His MRI has not been done yet and we will try and get him an earlier date Objective Constitutional Pulse regular. Respirations normal and unlabored. Afebrile. Vitals Time Taken: 12:51 PM, Height: 74 in, Weight: 288 lbs, BMI: 37, Temperature: 98.0 F, Pulse: 84 bpm, Blood Pressure: 134/64 mmHg. Eyes Nonicteric. Reactive to light. Ears, Nose, Mouth, and Throat Lips, teeth, and gums WNL.Marland Kitchen Moist mucosa without lesions. Neck supple and nontender. No palpable supraclavicular or cervical adenopathy. Normal sized without goiter. Respiratory WNL. No retractions.. Cardiovascular Pedal Pulses WNL. No clubbing,  cyanosis or edema. Lymphatic No adneopathy. No adenopathy. No adenopathy. Musculoskeletal Adexa without tenderness or enlargement.. Digits and nails w/o clubbing, cyanosis, infection, petechiae, ischemia, or inflammatory conditions.Marland Kitchen Psychiatric Judgement and insight Intact.. No evidence of depression, anxiety, or agitation.. General Notes: the undermining has decreased but it shows probes down to bone and other than that the surrounding inflammation seems to be better. There was no large abscess cavity and no pus drainage. Integumentary (Hair, Skin) No suspicious lesions. No crepitus or fluctuance. No peri-wound warmth or erythema. No masses.. Wound #1 status is Open. Original cause of wound was Gradually Appeared. The wound is located on the [place]. The wound measures 2cm length x 1.8cm width x 0.2cm depth; 2.827cm^2 area and CHRISOPHER, HOUSHOLDER. ([id number]) 0.565cm^3 volume. The wound is limited to skin breakdown. There is no tunneling or undermining noted. There is a large amount of serosanguineous drainage noted. The wound margin is distinct with the  outline attached to the wound base. There is medium (34-66%) granulation within the wound bed. There is a small (1-33%) amount of necrotic tissue within the wound bed including Adherent Slough. The periwound skin appearance exhibited: Callus. The periwound skin appearance did not exhibit: Crepitus, Excoriation, Fluctuance, Friable, Induration, Localized Edema, Rash, Scarring, Dry/Scaly, Maceration, Moist, Atrophie Blanche, Cyanosis, Ecchymosis, Hemosiderin Staining, Mottled, Pallor, Rubor, Erythema. Periwound temperature was noted as No Abnormality. Assessment Active Problems ICD-10 E11.621 - Type 2 diabetes mellitus with foot ulcer L97.512 - 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d L84 - Corns and callosities L02.611 - Cutaneous abscess of right foot In view of his culture report I  have also prescribed Diflucan 100 mg daily for 10 days. He will finish his course of ciprofloxacin and this week. I have tried to get him an earlier date for his MRI and we will continue to work with his insurance company to get him authorized for hyperbaric oxygen therapy. we hope to start his HBO therapy as soon as possible His chest x-ray and EKG was within normal limits. Plan Wound Cleansing: Wound #1 Right,Plantar Foot: Cleanse wound with mild soap and water May Shower, gently pat wound dry prior to applying new dressing. Anesthetic: Wound #1 Right,Plantar Foot: Topical Lidocaine 4% cream applied to wound bed prior to debridement Primary Wound Dressing: Wound #1 Right,Plantar Foot: JALANI, CHILLIS ([id number]) Aquacel Ag Secondary Dressing: Wound #1 Right,Plantar Foot: Gauze and Kerlix/Conform Dressing Change Frequency: Wound #1 Right,Plantar Foot: Change dressing every day. Follow-up Appointments: Wound #1 Right,Plantar Foot: Return Appointment in 1 week. Off-Loading: Wound #1 Right,Plantar Foot: Open toe surgical shoe with peg assist. - front offload Medications-please add to medication list.: Wound #1 Right,Plantar Foot: P.O. Antibiotics - Ciprofloxacin P.O. Antibiotics - Diflucan The following medication(s) was prescribed: Diflucan oral 100 mg tablet tablet oral oral daily starting 01/21/2015 In view of his culture report I have also prescribed Diflucan 100 mg daily for 10 days. He will finish his course of ciprofloxacin and this week. I have tried to get him an earlier date for his MRI and we will continue to work with his insurance company to get him authorized for hyperbaric oxygen therapy. we hope to start his HBO therapy as soon as possible His chest x-ray and EKG was within normal limits. Electronic Signature(s) Signed: 01/21/2015 1:42:41 PM By: Christin Fudge MD, FACS Entered By: Christin Fudge on 01/21/2015 13:42:41 [name]  ([id number]) -------------------------------------------------------------------------------- Francisca December Date of Service: 01/21/2015 Patient Name: J. Patient Account Number: [id number] Medical Record Afful, RN, BSN, [id number] Treating RN: Number: Velva Harman Date of Birth/Sex: 08-18-1965 (50 y.o. Male) Other Clinician: Primary Care Physician: Paticia Stack, Halla Chopp Referring Physician: [name] Physician/Extender: Weeks in Treatment: 2 Diagnosis Coding ICD-10 Codes Code Description E11.621 Type 2 diabetes mellitus with foot ulcer L97.512 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d L84 Corns and callosities L02.611 Cutaneous abscess of right foot Facility Procedures CPT4 Code: [id number] Description: [id number] - WOUND CARE VISIT-LEV 2 EST PT Modifier: Quantity: 1 Physician Procedures CPT4 Code Description: [id number] - WC PHYS LEVEL 3 - EST PT ICD-10 Description Diagnosis E11.621 Type 2 diabetes mellitus with foot ulcer L97.512 Non-pressure chronic ulcer of other part of right fo L84 Corns and callosities L02.611 Cutaneous abscess  of right foot Modifier: ot with fat laye Quantity: 1 r exposed Electronic Signature(s) Signed: 01/21/2015 1:43:00 PM By: Christin Fudge MD, FACS Entered By:  Christin Fudge on 01/21/2015 13:43:00

## 2015-01-27 ENCOUNTER — Ambulatory Visit (HOSPITAL_COMMUNITY)
Admission: RE | Admit: 2015-01-27 | Discharge: 2015-01-27 | Disposition: A | Discharge status: Home / Self Care | Payer: Managed Care, Other (non HMO) | Source: Ambulatory Visit | Attending: Surgery | Admitting: Surgery

## 2015-01-27 DIAGNOSIS — E119 Type 2 diabetes mellitus without complications: Secondary | ICD-10-CM | POA: Diagnosis not present

## 2015-01-27 DIAGNOSIS — L97512 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d: Secondary | ICD-10-CM | POA: Diagnosis not present

## 2015-01-27 DIAGNOSIS — R609 Edema, unspecified: Secondary | ICD-10-CM | POA: Diagnosis not present

## 2015-01-28 ENCOUNTER — Encounter: Payer: Managed Care, Other (non HMO) | Admitting: Surgery

## 2015-01-28 DIAGNOSIS — E11621 Type 2 diabetes mellitus with foot ulcer: Secondary | ICD-10-CM | POA: Diagnosis not present

## 2015-01-28 NOTE — Progress Notes (Addendum)
KAWAI, DEADMON ([id number]) Visit Report for 01/28/2015 Arrival Information Details Patient Name: [name], [name]. Date of Service: 01/28/2015 10:45 AM Medical Record Number: [id number] Patient Account Number: [id number] Date of Birth/Sex: 04-Apr-1965 (50 y.o. Male) Treating RN: Baruch Gouty, RN, BSN, Velva Harman Primary Care Physician: [name] Other Clinician: Referring Physician: [name] Treating Physician/Extender: Frann Rider in Treatment: 3 Visit Information History Since Last Visit Added or deleted any medications: No Patient Arrived: Ambulatory Any new allergies or adverse reactions: No Arrival Time: 10:31 Had a fall or experienced change in No Accompanied By: self activities of daily living that may affect Transfer Assistance: None risk of falls: Patient Identification Verified: Yes Signs or symptoms of abuse/neglect since last No Secondary Verification Process Yes visito Completed: Hospitalized since last visit: No Patient Requires Transmission-Based No Has Dressing in Place as Prescribed: Yes Precautions: Pain Present Now: No Patient Has Alerts: Yes Electronic Signature(s) Signed: 01/28/2015 10:32:10 AM By: Regan Lemming BSN, RN Entered By: Regan Lemming on 01/28/2015 10:32:10 [name] ([id number]) -------------------------------------------------------------------------------- Clinic Level of Care Assessment Details Patient Name: [name] Date of Service: 01/28/2015 10:45 AM Medical Record Number: [id number] Patient Account Number: [id number] Date of Birth/Sex: 1965/12/01 (50 y.o. Male) Treating RN: Baruch Gouty, RN, BSN, Velva Harman Primary Care Physician: [name] Other Clinician: Referring Physician: [name] Treating Physician/Extender: Frann Rider in Treatment: 3 Clinic Level of Care Assessment Items TOOL 4 Quantity Score []  - Use when only an EandM is performed on FOLLOW-UP visit 0 ASSESSMENTS - Nursing  Assessment / Reassessment X - Reassessment of Co-morbidities (includes updates in patient status) 1 10 X - Reassessment of Adherence to Treatment Plan 1 5 ASSESSMENTS - Wound and Skin Assessment / Reassessment X - Simple Wound Assessment / Reassessment - one wound 1 5 []  - Complex Wound Assessment / Reassessment - multiple wounds 0 []  - Dermatologic / Skin Assessment (not related to wound area) 0 ASSESSMENTS - Focused Assessment []  - Circumferential Edema Measurements - multi extremities 0 []  - Nutritional Assessment / Counseling / Intervention 0 X - Lower Extremity Assessment (monofilament, tuning fork, pulses) 1 5 []  - Peripheral Arterial Disease Assessment (using hand held doppler) 0 ASSESSMENTS - Ostomy and/or Continence Assessment and Care []  - Incontinence Assessment and Management 0 []  - Ostomy Care Assessment and Management (repouching, etc.) 0 PROCESS - Coordination of Care X - Simple Patient / Family Education for ongoing care 1 15 []  - Complex (extensive) Patient / Family Education for ongoing care 0 []  - Staff obtains Programmer, systems, Records, Test Results / Process Orders 0 []  - Staff telephones HHA, Nursing Homes / Clarify orders / etc 0 []  - Routine Transfer to another Facility (non-emergent condition) 0 ONOFRE, STUTEVILLE ([id number]) []  - Routine Hospital Admission (non-emergent condition) 0 []  - New Admissions / Biomedical engineer / Ordering NPWT, Apligraf, etc. 0 []  - Emergency Hospital Admission (emergent condition) 0 []  - Simple Discharge Coordination 0 []  - Complex (extensive) Discharge Coordination 0 PROCESS - Special Needs []  - Pediatric / Minor Patient Management 0 []  - Isolation Patient Management 0 []  - Hearing / Language / Visual special needs 0 []  - Assessment of Community assistance (transportation, D/C planning, etc.) 0 []  - Additional assistance / Altered mentation 0 []  - Support Surface(s) Assessment (bed, cushion, seat, etc.) 0 INTERVENTIONS -  Wound Cleansing / Measurement X - Simple Wound Cleansing - one wound 1 5 []  - Complex Wound Cleansing - multiple wounds 0 X - Wound Imaging (photographs - any number of  wounds) 1 5 []  - Wound Tracing (instead of photographs) 0 X - Simple Wound Measurement - one wound 1 5 []  - Complex Wound Measurement - multiple wounds 0 INTERVENTIONS - Wound Dressings X - Small Wound Dressing one or multiple wounds 1 10 []  - Medium Wound Dressing one or multiple wounds 0 []  - Large Wound Dressing one or multiple wounds 0 []  - Application of Medications - topical 0 []  - Application of Medications - injection 0 INTERVENTIONS - Miscellaneous []  - External ear exam 0 HARJAP, TODOROVICH ([id number]) []  - Specimen Collection (cultures, biopsies, blood, body fluids, etc.) 0 []  - Specimen(s) / Culture(s) sent or taken to Lab for analysis 0 []  - Patient Transfer (multiple staff / Harrel Lemon Lift / Similar devices) 0 []  - Simple Staple / Suture removal (25 or less) 0 []  - Complex Staple / Suture removal (26 or more) 0 []  - Hypo / Hyperglycemic Management (close monitor of Blood Glucose) 0 []  - Ankle / Brachial Index (ABI) - do not check if billed separately 0 X - Vital Signs 1 5 Has the patient been seen at the hospital within the last three years: Yes Total Score: 70 Level Of Care: New/Established - Level 2 Electronic Signature(s) Signed: 01/28/2015 4:55:10 PM By: Regan Lemming BSN, RN Entered By: Regan Lemming on 01/28/2015 11:04:44 [name] ([id number]) -------------------------------------------------------------------------------- Encounter Discharge Information Details Patient Name: [name] Date of Service: 01/28/2015 10:45 AM Medical Record Number: [id number] Patient Account Number: [id number] Date of Birth/Sex: April 29, 1965 (50 y.o. Male) Treating RN: Baruch Gouty, RN, BSN, Velva Harman Primary Care Physician: [name] Other Clinician: Referring Physician: [name] Treating  Physician/Extender: Frann Rider in Treatment: 3 Encounter Discharge Information Items Discharge Pain Level: 0 Discharge Condition: Stable Ambulatory Status: Ambulatory Discharge Destination: Home Transportation: Private Auto Accompanied By: self Schedule Follow-up Appointment: No Medication Reconciliation completed and provided to Patient/Care No Epiphany Seltzer: Provided on Clinical Summary of Care: 01/28/2015 Form Type Recipient Paper Patient DP Electronic Signature(s) Signed: 01/28/2015 11:18:31 AM By: Ruthine Dose Entered By: Ruthine Dose on 01/28/2015 11:18:31 [name] ([id number]) -------------------------------------------------------------------------------- Lower Extremity Assessment Details Patient Name: [name] Date of Service: 01/28/2015 10:45 AM Medical Record Number: [id number] Patient Account Number: [id number] Date of Birth/Sex: [birth year] (50 y.o. Male) Treating RN: Baruch Gouty, RN, BSN, Velva Harman Primary Care Physician: [name] Other Clinician: Referring Physician: [name] Treating Physician/Extender: Frann Rider in Treatment: 3 Vascular Assessment Pulses: Posterior Tibial Dorsalis Pedis Palpable: [Right:Yes] Extremity colors, hair growth, and conditions: Extremity Color: [Right:Normal] Hair Growth on Extremity: [Right:Yes] Temperature of Extremity: [Right:Warm] Capillary Refill: [Right:< 3 seconds] Toe Nail Assessment Left: Right: Thick: No Discolored: No Deformed: No Improper Length and Hygiene: No Electronic Signature(s) Signed: 01/28/2015 4:55:10 PM By: Regan Lemming BSN, RN Entered By: Regan Lemming on 01/28/2015 10:38:30 [name] ([id number]) -------------------------------------------------------------------------------- Multi Wound Chart Details Patient Name: [name] Date of Service: 01/28/2015 10:45 AM Medical Record Number: [id number] Patient Account Number: [id number] Date  of Birth/Sex: [birth year] (50 y.o. Male) Treating RN: Baruch Gouty, RN, BSN, Velva Harman Primary Care Physician: [name] Other Clinician: Referring Physician: [name] Treating Physician/Extender: Frann Rider in Treatment: 3 Vital Signs Height(in): 74 Pulse(bpm): 87 Weight(lbs): 288 Blood Pressure 158/64 (mmHg): Body Mass Index(BMI): 37 Temperature(F): 98.3 Respiratory Rate 17 (breaths/min): Photos: [1:No Photos] [N/A:N/A] Wound Location: [1:Right Foot - Plantar] [N/A:N/A] Wounding Event: [1:Gradually Appeared] [N/A:N/A] Primary Etiology: [1:Diabetic Wound/Ulcer of the Lower Extremity] [N/A:N/A] Comorbid History: [1:Hypertension, Type II Diabetes, Neuropathy] [N/A:N/A] Date Acquired: [1:12/30/2014] [N/A:N/A]  Weeks of Treatment: [1:3] [N/A:N/A] Wound Status: [1:Open] [N/A:N/A] Measurements L x W x D 2x1.7x0.1 [N/A:N/A] (cm) Area (cm) : [1:2.67] [N/A:N/A] Volume (cm) : [1:0.267] [N/A:N/A] % Reduction in Area: [1:-240.10%] [N/A:N/A] % Reduction in Volume: -13.10% [N/A:N/A] Classification: [1:Grade 3] [N/A:N/A] Wagner Verification: [1:Abscess] [N/A:N/A] Exudate Amount: [1:Large] [N/A:N/A] Exudate Type: [1:Serosanguineous] [N/A:N/A] Exudate Color: [1:red, brown] [N/A:N/A] Wound Margin: [1:Distinct, outline attached] [N/A:N/A] Granulation Amount: [1:Medium (34-66%)] [N/A:N/A] Necrotic Amount: [1:Small (1-33%)] [N/A:N/A] Exposed Structures: [1:Fascia: No Fat: No Tendon: No Muscle: No Joint: No Bone: No] [N/A:N/A] Limited to Skin Breakdown Epithelialization: None N/A N/A Periwound Skin Texture: Callus: Yes N/A N/A Edema: No Excoriation: No Induration: No Crepitus: No Fluctuance: No Friable: No Rash: No Scarring: No Periwound Skin Moist: Yes N/A N/A Moisture: Maceration: No Dry/Scaly: No Periwound Skin Color: Atrophie Blanche: No N/A N/A Cyanosis: No Ecchymosis: No Erythema: No Hemosiderin Staining: No Mottled: No Pallor: No Rubor:  No Temperature: No Abnormality N/A N/A Tenderness on No N/A N/A Palpation: Wound Preparation: Ulcer Cleansing: N/A N/A Rinsed/Irrigated with Saline Topical Anesthetic Applied: Other: Lidocaine 4% Treatment Notes Electronic Signature(s) Signed: 01/28/2015 4:55:10 PM By: Regan Lemming BSN, RN Entered By: Regan Lemming on 01/28/2015 10:56:45 [name] ([id number]) -------------------------------------------------------------------------------- [place] Details Patient Name: KEATH, HEGARTY. Date of Service: 01/28/2015 10:45 AM Medical Record Number: [id number] Patient Account Number: [id number] Date of Birth/Sex: [birth year] (50 y.o. Male) Treating RN: Baruch Gouty, RN, BSN, Velva Harman Primary Care Physician: [name] Other Clinician: Referring Physician: [name] Treating Physician/Extender: Frann Rider in Treatment: 3 Active Inactive HBO Nursing Diagnoses: Anxiety related to feelings of confinement associated with the hyperbaric oxygen chamber Anxiety related to knowledge deficit of hyperbaric oxygen therapy and treatment procedures Discomfort related to temperature and humidity changes inside hyperbaric chamber Potential for barotraumas to ears, sinuses, teeth, and lungs or cerebral gas embolism related to changes in atmospheric pressure inside hyperbaric oxygen chamber Potential for oxygen toxicity seizures related to delivery of 100% oxygen at an increased atmospheric pressure Potential for pulmonary oxygen toxicity related to delivery of 100% oxygen at an increased atmospheric pressure Goals: Barotrauma will be prevented during HBO2 Date Initiated: 01/07/2015 Goal Status: Active Patient and/or family will be able to state/discuss factors appropriate to the management of their disease process during treatment Date Initiated: 01/07/2015 Goal Status: Active Patient will tolerate the hyperbaric oxygen therapy treatment Date Initiated:  01/07/2015 Goal Status: Active Patient will tolerate the internal climate of the chamber Date Initiated: 01/07/2015 Goal Status: Active Patient/caregiver will verbalize understanding of HBO goals, rationale, procedures and potential hazards Date Initiated: 01/07/2015 Goal Status: Active Signs and symptoms of pulmonary oxygen toxicity will be recognized and promptly addressed Date Initiated: 01/07/2015 Goal Status: Active Signs and symptoms of seizure will be recognized and promptly addressed ; seizing patients will suffer no harm Date Initiated: 01/07/2015 [name] ([id number]) Goal Status: Active Interventions: Administer a five (5) minute air break for patient if signs and symptoms of seizure appear and notify the hyperbaric physician Administer a ten (10) minute air break for patient if signs and symptoms of seizure appear and notify the hyperbaric physician Administer decongestants, per physician orders, prior to HBO2 Administer the correct therapeutic gas delivery based on the patients needs and limitations, per physician order Assess and provide for patientos comfort related to the hyperbaric environment and equalization of middle ear Assess for signs and symptoms related to adverse events, including but not limited to confinement anxiety, pneumothorax, oxygen toxicity and baurotrauma Assess patient for any history  of confinement anxiety Assess patient's knowledge and expectations regarding hyperbaric medicine and provide education related to the hyperbaric environment, goals of treatment and prevention of adverse events Implement protocols to decrease risk of pneumothorax in high risk patients Notes: Orientation to the Wound Care Program Nursing Diagnoses: Knowledge deficit related to the wound healing center program Goals: Patient/caregiver will verbalize understanding of the [place] Program Date Initiated: 01/07/2015 Goal Status:  Active Interventions: Provide education on orientation to the wound center Notes: Peripheral Neuropathy Nursing Diagnoses: Knowledge deficit related to disease process and management of peripheral neurovascular dysfunction Potential alteration in peripheral tissue perfusion (select prior to confirmation of diagnosis) Goals: Patient/caregiver will verbalize understanding of disease process and disease management Date Initiated: 01/07/2015 Goal Status: Active DHRUVA, FEARNLEY ([id number]) Interventions: Assess signs and symptoms of neuropathy upon admission and as needed Provide education on Management of Neuropathy and Related Ulcers Provide education on Management of Neuropathy upon discharge from the [place] for HBO Treatment Activities: Consult for HBO : 01/28/2015 Patient referred for customized footwear/offloading : 01/28/2015 Notes: Wound/Skin Impairment Nursing Diagnoses: Impaired tissue integrity Knowledge deficit related to ulceration/compromised skin integrity Goals: Patient/caregiver will verbalize understanding of skin care regimen Date Initiated: 01/07/2015 Goal Status: Active Ulcer/skin breakdown will have a volume reduction of 30% by week 4 Date Initiated: 01/07/2015 Goal Status: Active Ulcer/skin breakdown will have a volume reduction of 50% by week 8 Date Initiated: 01/07/2015 Goal Status: Active Ulcer/skin breakdown will have a volume reduction of 80% by week 12 Date Initiated: 01/07/2015 Goal Status: Active Ulcer/skin breakdown will heal within 14 weeks Date Initiated: 01/07/2015 Goal Status: Active Interventions: Assess patient/caregiver ability to obtain necessary supplies Assess patient/caregiver ability to perform ulcer/skin care regimen upon admission and as needed Assess ulceration(s) every visit Provide education on ulcer and skin care Treatment Activities: Referred to DME Floyce Bujak for dressing supplies : 01/28/2015 Skin care regimen  initiated : 01/28/2015 MARKEAL, DELCONTE ([id number]) Topical wound management initiated : 01/28/2015 Notes: Electronic Signature(s) Signed: 01/28/2015 4:55:10 PM By: Regan Lemming BSN, RN Entered By: Regan Lemming on 01/28/2015 10:56:36 [name] ([id number]) -------------------------------------------------------------------------------- Pain Assessment Details Patient Name: [name] Date of Service: 01/28/2015 10:45 AM Medical Record Number: [id number] Patient Account Number: [id number] Date of Birth/Sex: September 04, 1965 (50 y.o. Male) Treating RN: Baruch Gouty, RN, BSN, Velva Harman Primary Care Physician: [name] Other Clinician: Referring Physician: [name] Treating Physician/Extender: Frann Rider in Treatment: 3 Active Problems Location of Pain Severity and Description of Pain Patient Has Paino No Site Locations Pain Management and Medication Current Pain Management: Electronic Signature(s) Signed: 01/28/2015 4:55:10 PM By: Regan Lemming BSN, RN Entered By: Regan Lemming on 01/28/2015 10:35:52 [name] ([id number]) -------------------------------------------------------------------------------- Patient/Caregiver Education Details Patient Name: [name] Date of Service: 01/28/2015 10:45 AM Medical Record Number: [id number] Patient Account Number: [id number] Date of Birth/Gender: Oct 23, 1965 (50 y.o. Male) Treating RN: Baruch Gouty, RN, BSN, Velva Harman Primary Care Physician: [name] Other Clinician: Referring Physician: [name] Treating Physician/Extender: Frann Rider in Treatment: 3 Education Assessment Education Provided To: Patient Education Topics Provided Peripheral Neuropathy: Methods: Explain/Verbal Responses: State content correctly Welcome To [facility]: Methods: Explain/Verbal Responses: State content correctly Wound/Skin Impairment: Methods: Explain/Verbal Responses: State content  correctly Electronic Signature(s) Signed: 01/28/2015 4:55:10 PM By: Regan Lemming BSN, RN Entered By: Regan Lemming on 01/28/2015 11:00:28 [name] ([id number]) -------------------------------------------------------------------------------- Wound Assessment Details Patient Name: [name] Date of Service: 01/28/2015 10:45 AM Medical Record Number: [id number] Patient Account Number:  [id number] Date of Birth/Sex: 1965/05/08 (50 y.o. Male) Treating RN: Afful, RN, BSN, Velva Harman Primary Care Physician: [name] Other Clinician: Referring Physician: [name] Treating Physician/Extender: Frann Rider in Treatment: 3 Wound Status Wound Number: 1 Primary Diabetic Wound/Ulcer of the Lower Etiology: Extremity Wound Location: Right Foot - Plantar Wound Status: Open Wounding Event: Gradually Appeared Comorbid Hypertension, Type II Diabetes, Date Acquired: 12/30/2014 History: Neuropathy Weeks Of Treatment: 3 Clustered Wound: No Photos Photo Uploaded By: Regan Lemming on 01/28/2015 16:50:44 Wound Measurements Length: (cm) 2 Width: (cm) 1.7 Depth: (cm) 0.1 Area: (cm) 2.67 Volume: (cm) 0.267 % Reduction in Area: -240.1% % Reduction in Volume: -13.1% Epithelialization: None Tunneling: No Undermining: No Wound Description Classification: Grade 3 Foul Odor Af Wagner Verification: Abscess Wound Margin: Distinct, outline attached Exudate Amount: Large Exudate Type: Serosanguineous Exudate Color: red, brown ter Cleansing: No Wound Bed Granulation Amount: Medium (34-66%) Exposed Structure Necrotic Amount: Small (1-33%) Fascia Exposed: No Necrotic Quality: Adherent Slough Fat Layer Exposed: No SHOGO, SPEARIN ([id number]) Tendon Exposed: No Muscle Exposed: No Joint Exposed: No Bone Exposed: No Limited to Skin Breakdown Periwound Skin Texture Texture Color No Abnormalities Noted: No No Abnormalities Noted: No Callus: Yes Atrophie  Blanche: No Crepitus: No Cyanosis: No Excoriation: No Ecchymosis: No Fluctuance: No Erythema: No Friable: No Hemosiderin Staining: No Induration: No Mottled: No Localized Edema: No Pallor: No Rash: No Rubor: No Scarring: No Temperature / Pain Moisture Temperature: No Abnormality No Abnormalities Noted: No Dry / Scaly: No Maceration: No Moist: Yes Wound Preparation Ulcer Cleansing: Rinsed/Irrigated with Saline Topical Anesthetic Applied: Other: Lidocaine 4%, Treatment Notes Wound #1 (Right, Plantar Foot) 1. Cleansed with: Clean wound with Normal Saline 4. Dressing Applied: Aquacel Ag 5. Secondary Dressing Applied Gauze and Kerlix/Conform 6. Footwear/Offloading device applied Other footwear/offloading device applied (specify in notes) 7. Secured with Tape Notes darco front Dietitian) Signed: 01/28/2015 10:44:15 AM By: Regan Lemming BSN, RN Entered By: Regan Lemming on 01/28/2015 10:44:15 BUN, RINNE ([id number]) MATTSON, SOUTHERN ([id number]) -------------------------------------------------------------------------------- Vitals Details Patient Name: [name] Date of Service: 01/28/2015 10:45 AM Medical Record Number: [id number] Patient Account Number: [id number] Date of Birth/Sex: June 16, 1965 (50 y.o. Male) Treating RN: Baruch Gouty, RN, BSN, [place] Primary Care Physician: [name] Other Clinician: Referring Physician: [name] Treating Physician/Extender: Frann Rider in Treatment: 3 Vital Signs Time Taken: 10:35 Temperature (F): 98.3 Height (in): 74 Pulse (bpm): 87 Weight (lbs): 288 Respiratory Rate (breaths/min): 17 Body Mass Index (BMI): 37 Blood Pressure (mmHg): 158/64 Reference Range: 80 - 120 mg / dl Electronic Signature(s) Signed: 01/28/2015 4:55:10 PM By: Regan Lemming BSN, RN Entered By: Regan Lemming on 01/28/2015 10:36:18

## 2015-01-28 NOTE — Progress Notes (Addendum)
TYSHUN, BATTON ([id number]) Visit Report for 01/28/2015 Chief Complaint Document Details NOCHOLAS, OUELLETTE 01/28/2015 10:45 Patient Name: Date of Service: [name] Medical Record Patient Account Number: [id number] Number: Treating RN: Baruch Gouty, RN, BSN, Velva Harman Date of Birth/Sex: [birth year] (50 y.o. Male) Other Clinician: Primary Care Physician: [name] Treating Christin Fudge Referring Physician: [name] Physician/Extender: Weeks in Treatment: 3 Information Obtained from: Patient Chief Complaint Patients presents for treatment of an open diabetic ulcer on the plantar aspect of the right foot which she's had for about 4 weeks Electronic Signature(s) Signed: 01/28/2015 11:14:27 AM By: Christin Fudge MD, FACS Entered By: Christin Fudge on 01/28/2015 11:14:27 [name] -------------------------------------------------------------------------------- HPI Details JUERGEN, SEBREE 01/28/2015 10:45 Patient Name: Date of Service: [name] Medical Record Patient Account Number: [id number] Number: Treating RN: Baruch Gouty RN, BSN, Velva Harman Date of Birth/Sex: February 26, 1965 (50 y.o. Male) Other Clinician: Primary Care Physician: [name] Treating Christin Fudge Referring Physician: [name] Physician/Extender: Weeks in Treatment: 3 History of Present Illness Location: plantar aspect of the right forefoot Quality: Patient reports No Pain. Severity: Patient states wound are getting worse. Duration: Patient has had the wound for < 4 weeks prior to presenting for treatment Timing: he has minimal discharge from the wound Context: The wound appeared gradually over time Modifying Factors: Other treatment(s) tried include:plan local care but not offloading Associated Signs and Symptoms: Patient reports having:no pain or discharge from the wound. HPI Description: This 50 year old male comes with an ulcerated area on the plantar aspect  of the right foot which she's had for approximately a month. I have known him from a previous visit at [facility] wound center and was treated in the months of April and May 2016 and rapidly healed a left plantar ulcer with a total contact cast. He has been a diabetic for about 16 years and tries to keep active and is fairly compliant with his diabetes management. 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 Past medical history significant for hypertension, hyperlipidemia, and status post appendectomy 1993. He does not smoke or drink alcohol. 01/14/2015 -- the patient had tolerated his total contact cast very well and had no problems and has had no systemic symptoms. However when his total contact cast was cut open he had excessive amount of purulent drainage in spite of being on antibiotics. He had had a recent x-ray done in the ER 12/22/2014 which showed IMPRESSION:No evidence of osseous erosion. Known soft tissue ulceration is not well characterized on radiograph. Scattered vascular calcifications seen. his last hemoglobin A1c in December was 7.3. He has been on Augmentin and doxycycline for the last 2 weeks. 01/21/2015 -- his culture grew rare growth of Pantoea species an MR moderate growth of Candida parapsilosis. it is sensitive to levofloxacin. He has not heard back from the insurance company regarding his hyperbaric oxygen therapy. His MRI has not been done yet and we will try and get him an earlier date 01/28/2015 -- MRI was done last night -- IMPRESSION:1. Soft tissue ulcer overlying the plantar aspect of the fifth metatarsal head extending to the cortex. Subcortical marrow edema in the fifth metatarsal head with corresponding T1 hypointensity is concerning for early osteomyelitis of the plantar lateral aspect of the fifth metatarsal head. Chest x-ray done on 01/14/2015 shows bronchiectatic changes without infiltrate. EKG done on generally 17 2017 shows a normal sinus rhythm and is  a normal EKG. EDRICK, COST ([id number]) Electronic Signature(s) Signed: 01/28/2015 11:15:33 AM By: Christin Fudge MD, FACS Previous Signature:  01/28/2015 10:55:01 AM Version By: Christin Fudge MD, FACS Entered By: Christin Fudge on 01/28/2015 11:15:32 [name] -------------------------------------------------------------------------------- Physical Exam Details WILTON, BONACCORSO 01/28/2015 10:45 Patient Name: Date of Service: [name] Medical Record Patient Account Number: [id number] Number: Treating RN: Baruch Gouty RN, BSN, Velva Harman Date of Birth/Sex: July 17, 1965 (50 y.o. Male) Other Clinician: Primary Care Physician: [name] Treating Christin Fudge Referring Physician: [name] Physician/Extender: Weeks in Treatment: 3 Constitutional . Pulse regular. Respirations normal and unlabored. Afebrile. . Eyes Nonicteric. Reactive to light. Ears, Nose, Mouth, and Throat Lips, teeth, and gums WNL.Marland Kitchen Moist mucosa without lesions. Neck supple and nontender. No palpable supraclavicular or cervical adenopathy. Normal sized without goiter. Respiratory WNL. No retractions.. Cardiovascular Pedal Pulses WNL. No clubbing, cyanosis or edema. Lymphatic No adneopathy. No adenopathy. No adenopathy. Musculoskeletal Adexa without tenderness or enlargement.. Digits and nails w/o clubbing, cyanosis, infection, petechiae, ischemia, or inflammatory conditions.. Integumentary (Hair, Skin) No suspicious lesions. No crepitus or fluctuance. No peri-wound warmth or erythema. No masses.Marland Kitchen Psychiatric Judgement and insight Intact.. No evidence of depression, anxiety, or agitation.. Notes the wound continues to probe down to bone and there is no surrounding cellulitis of eschar to be sharply debridement today. Electronic Signature(s) Signed: 01/28/2015 11:16:19 AM By: Christin Fudge MD, FACS Entered By: Christin Fudge on 01/28/2015 11:16:18 [name]  ([id number]) -------------------------------------------------------------------------------- Physician Orders Details JATERRIUS, GERLITZ 01/28/2015 10:45 Patient Name: Date of Service: [name] AM Medical Record Patient Account Number: [id number] Number: Treating RN: Baruch Gouty, RN, BSN, Velva Harman Date of Birth/Sex: May 19, 1965 (50 y.o. Male) Other Clinician: Primary Care Physician: Fay Records Referring Physician: [name] Physician/Extender: Suella Grove in Treatment: 3 Verbal / Phone Orders: Yes Clinician: Afful, RN, BSN, Rita Read Back and Verified: Yes Diagnosis Coding Wound Cleansing Wound #1 Right,Plantar Foot o Cleanse wound with mild soap and water o May Shower, gently pat wound dry prior to applying new dressing. Anesthetic Wound #1 Right,Plantar Foot o Topical Lidocaine 4% cream applied to wound bed prior to debridement Primary Wound Dressing Wound #1 Right,Plantar Foot o Aquacel Ag Secondary Dressing Wound #1 Right,Plantar Foot o Gauze and Kerlix/Conform Dressing Change Frequency Wound #1 Right,Plantar Foot o Change dressing every day. Follow-up Appointments Wound #1 Right,Plantar Foot o Return Appointment in 1 week. Off-Loading Wound #1 Right,Plantar Foot o Open toe surgical shoe with peg assist. - front offload Medications-please add to medication list. Wound #1 Right,Plantar Foot o P.O. Antibiotics - Ciprofloxacin o P.O. Antibiotics - Diflucan NORMAL, GRESS ([id number]) Consults o Infectious Disease - right foot osteomyeitis...for IV antibiotics Electronic Signature(s) Signed: 01/28/2015 4:27:41 PM By: Christin Fudge MD, FACS Signed: 01/28/2015 4:55:10 PM By: Regan Lemming BSN, RN Entered By: Regan Lemming on 01/28/2015 11:04:02 [name] -------------------------------------------------------------------------------- Problem List Details PARRISH, SPOMER 01/28/2015  10:45 Patient Name: Date of Service: [name] AM Medical Record Patient Account Number: [id number] Number: Treating RN: Baruch Gouty, RN, BSN, Velva Harman Date of Birth/Sex: [birth year] (50 y.o. 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 Medical Record Patient Account Number: [id number] Number: Treating RN: Baruch Gouty, RN, BSN, Velva Harman Date of Birth/Sex: [birth year] (50 y.o. Male) Other Clinician: Primary Care Physician: Paticia Stack, Akeria Hedstrom Referring Physician: [name] Physician/Extender: Weeks in Treatment: 3 Subjective Chief Complaint Information obtained from Patient Patients presents for treatment of an open diabetic ulcer on the plantar aspect of the right foot which she's had for about 4 weeks History of Present Illness (HPI) The following HPI elements were documented for the patient's wound: Location: plantar aspect of the right forefoot Quality: Patient reports No Pain. Severity: Patient states wound are getting worse. Duration: Patient has had the  wound for < 4 weeks prior to presenting for treatment Timing: he has minimal discharge from the wound Context: The wound appeared gradually over time Modifying Factors: Other treatment(s) tried include:plan local care but not offloading Associated Signs and Symptoms: Patient reports having:no pain or discharge from the wound. This 50 year old male comes with an ulcerated area on the plantar aspect of the right foot which she's had for approximately a month. I have known him from a previous visit at [facility] wound center and was treated in the months of April and May 2016 and rapidly healed a left plantar ulcer with a total contact cast. He has been a diabetic for about 16 years and tries to keep active and is fairly compliant with his diabetes management. 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 Past medical history significant for hypertension, hyperlipidemia, and status post appendectomy 1993. He does not smoke or drink alcohol. 01/14/2015 -- the patient had tolerated his total contact cast very well and had no problems and has had no systemic symptoms. However when his total contact cast was cut open he had excessive amount of purulent drainage in spite of being on antibiotics. He had had a recent x-ray done in the ER 12/22/2014 which showed IMPRESSION:No evidence of osseous erosion. Known soft tissue ulceration is not well characterized on radiograph. Scattered vascular calcifications seen. his last hemoglobin A1c in December was 7.3. He has been on Augmentin and doxycycline for the last 2 weeks. 01/21/2015 -- his culture grew rare growth of Pantoea species an MR moderate growth of Candida parapsilosis. it is sensitive to levofloxacin. He has not heard back from the insurance company regarding his hyperbaric oxygen therapy. JACKS, MUTTI ([id number]) His MRI has not been done yet and we will try and get him an earlier date 01/28/2015 -- MRI was done last night --  IMPRESSION:1. Soft tissue ulcer overlying the plantar aspect of the fifth metatarsal head extending to the cortex. Subcortical marrow edema in the fifth metatarsal head with corresponding T1 hypointensity is concerning for early osteomyelitis of the plantar lateral aspect of the fifth metatarsal head. Chest x-ray done on 01/14/2015 shows bronchiectatic changes without infiltrate. EKG done on generally 17 2017 shows a normal sinus rhythm and is a normal EKG. Objective Constitutional Pulse regular. Respirations normal and unlabored. Afebrile. Vitals Time Taken: 10:35 AM, Height: 74 in, Weight: 288 lbs, BMI: 37, Temperature: 98.3 F, Pulse: 87 bpm, Respiratory Rate: 17 breaths/min, Blood Pressure: 158/64 mmHg. Eyes Nonicteric. Reactive to light. Ears, Nose, Mouth, and Throat Lips, teeth, and gums WNL.Marland Kitchen Moist mucosa without lesions. Neck supple and nontender. No palpable supraclavicular or cervical adenopathy. Normal sized without goiter. Respiratory WNL. No retractions.. Cardiovascular Pedal Pulses WNL.  No clubbing, cyanosis or edema. Lymphatic No adneopathy. No adenopathy. No adenopathy. Musculoskeletal Adexa without tenderness or enlargement.. Digits and nails w/o clubbing, cyanosis, infection, petechiae, ischemia, or inflammatory conditions.Marland Kitchen Psychiatric Judgement and insight Intact.. No evidence of depression, anxiety, or agitation.Marland Kitchen WILLSON, GRATE ([id number]) General Notes: the wound continues to probe down to bone and there is no surrounding cellulitis of eschar to be sharply debridement today. Integumentary (Hair, Skin) No suspicious lesions. No crepitus or fluctuance. No peri-wound warmth or erythema. No masses.. Wound #1 status is Open. Original cause of wound was Gradually Appeared. The wound is located on the [place]. The wound measures 2cm length x 1.7cm width x 0.1cm depth; 2.67cm^2 area and 0.267cm^3 volume. The wound is limited to skin breakdown.  There is no tunneling or undermining noted. There is a large amount of serosanguineous drainage noted. The wound margin is distinct with the outline attached to the wound base. There is medium (34-66%) granulation within the wound bed. There is a small (1-33%) amount of necrotic tissue within the wound bed including Adherent Slough. The periwound skin appearance exhibited: Callus, Moist. The periwound skin appearance did not exhibit: Crepitus, Excoriation, Fluctuance, Friable, Induration, Localized Edema, Rash, Scarring, Dry/Scaly, Maceration, Atrophie Blanche, Cyanosis, Ecchymosis, Hemosiderin Staining, Mottled, Pallor, Rubor, Erythema. Periwound temperature was noted as No Abnormality. Assessment Active Problems ICD-10 E11.621 - Type 2 diabetes mellitus with foot ulcer L97.512 - 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d L84 - Corns and callosities L02.611 - Cutaneous abscess of right foot M86.371 - Chronic multifocal osteomyelitis, right ankle and foot This 50 year old diabetic with a Wagner grade 3 diabetic foot ulcer, has progressed with a frank abscess in the subcutaneous tissue in spite of being on 2 antibiotics for the last 2 weeks. I have reviewed his MRI of the right foot and discussed his results which show early osteomyelitis. I have also recommended silver alginate to be packed into this wound and appropriate padding and daily dressings to be done. We will also give him a Darco front offloading shoe. He agrees to be seen by Dr. Adrian Prows for ID consult and I will put in a call to Dr. [name] to discuss his management. CALE, MAZZOCCHI ([id number]) This stage I believe he's had this problem for over a month and a half and in spite of every good effort has progressed with a frank abscess. Besides local care I have recommended hyperbaric oxygen therapy and I discussed the risks benefits and alternatives of them in great detail.  After answering all his questions he is agreeable to proceed. He has had a normal chest x-ray and EKG and will be starting hyperbaric oxygen therapy soon. Plan Wound Cleansing: Wound #1 Right,Plantar Foot: Cleanse wound with mild soap and water May Shower, gently pat wound dry prior to applying new dressing. Anesthetic: Wound #1 Right,Plantar Foot: Topical Lidocaine 4% cream applied to wound bed prior to debridement Primary Wound Dressing: Wound #1 Right,Plantar Foot: Aquacel Ag Secondary Dressing: Wound #1 Right,Plantar Foot: Gauze and Kerlix/Conform Dressing Change Frequency: Wound #1 Right,Plantar Foot: Change dressing every day. Follow-up Appointments: Wound #1 Right,Plantar Foot: Return Appointment in 1 week. Off-Loading: Wound #1 Right,Plantar Foot: Open toe surgical shoe with peg assist. - front offload Medications-please add to medication list.: Wound #1 Right,Plantar Foot: P.O. Antibiotics - Ciprofloxacin P.O. Antibiotics - Diflucan Consults ordered were: Infectious Disease - right foot osteomyeitis...for IV antibiotics This 50 year old diabetic with a Wagner grade 3 diabetic foot ulcer, has  progressed with a frank abscess in the subcutaneous tissue in spite of being on 2 antibiotics for the last 2 weeks. I have reviewed his MRI of the right foot and discussed his results which show early osteomyelitis. AZAREL, MCNIFF ([id number]) I have also recommended silver alginate to be packed into this wound and appropriate padding and daily dressings to be done. We will also give him a Darco front offloading shoe. He agrees to be seen by Dr. Adrian Prows for ID consult and I will put in a call to Dr. [name] to discuss his management. This stage I believe he's had this problem for over a month and a half and in spite of every good effort has progressed with a frank abscess. Besides local care I have recommended hyperbaric oxygen therapy and I discussed the risks  benefits and alternatives of them in great detail. After answering all his questions he is agreeable to proceed. He has had a normal chest x-ray and EKG and will be starting hyperbaric oxygen therapy soon. Electronic Signature(s) Signed: 01/28/2015 11:34:33 AM By: Christin Fudge MD, FACS Previous Signature: 01/28/2015 11:18:12 AM Version By: Christin Fudge MD, FACS Entered By: Christin Fudge on 01/28/2015 11:34:33 [name] ([id number]) -------------------------------------------------------------------------------- Francisca December Date of Service: 01/28/2015 Patient Name: J. Patient Account Number: [id number] Medical Record Afful, RN, BSN, [id number] Treating RN: Number: Velva Harman Date of Birth/Sex: Jul 22, 1965 (50 y.o. Male) Other Clinician: Primary Care Physician: Fay Records Referring Physician: [name] Physician/Extender: Weeks in Treatment: 3 Diagnosis Coding ICD-10 Codes Code Description E11.621 Type 2 diabetes mellitus with foot ulcer L97.512 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d L84 Corns and callosities L02.611 Cutaneous abscess of right foot M86.371 Chronic multifocal osteomyelitis, right ankle and foot Facility Procedures CPT4 Code: [id number] Description: [id number] - WOUND CARE VISIT-LEV 2 EST PT Modifier: Quantity: 1 Physician Procedures CPT4 Code Description: [id number] - WC PHYS LEVEL 3 - EST PT ICD-10 Description Diagnosis E11.621 Type 2 diabetes mellitus with foot ulcer L97.512 Non-pressure chronic ulcer of other part of right fo L02.611 Cutaneous abscess of right foot M86.371  Chronic multifocal osteomyelitis, right ankle and fo Modifier: ot with fat laye ot Quantity: 1 r exposed Electronic Signature(s) Signed: 01/28/2015 11:34:49 AM By: Christin Fudge MD, FACS Entered By: Christin Fudge on 01/28/2015 11:34:49

## 2015-02-01 ENCOUNTER — Encounter: Payer: Managed Care, Other (non HMO) | Admitting: Surgery

## 2015-02-01 DIAGNOSIS — E11621 Type 2 diabetes mellitus with foot ulcer: Secondary | ICD-10-CM | POA: Diagnosis not present

## 2015-02-01 LAB — GLUCOSE, CAPILLARY
Glucose-Capillary: 120 mg/dL — ABNORMAL HIGH (ref 65–99)
Glucose-Capillary: 140 mg/dL — ABNORMAL HIGH (ref 65–99)
Glucose-Capillary: 130 mg/dL — ABNORMAL HIGH (ref 65–99)

## 2015-02-02 ENCOUNTER — Encounter: Payer: Managed Care, Other (non HMO) | Admitting: Surgery

## 2015-02-02 DIAGNOSIS — E11621 Type 2 diabetes mellitus with foot ulcer: Secondary | ICD-10-CM | POA: Diagnosis not present

## 2015-02-02 LAB — GLUCOSE, CAPILLARY
Glucose-Capillary: 142 mg/dL — ABNORMAL HIGH (ref 65–99)
Glucose-Capillary: 122 mg/dL — ABNORMAL HIGH (ref 65–99)

## 2015-02-02 NOTE — Progress Notes (Signed)
[name], [name] ([id number]) Visit Report for 02/01/2015 Arrival Information Details [name], [name] 02/01/2015 1:30 Patient Name: Date of Service: [name] Medical Record Patient Account Number: [id number] Number: Treating RN: Date of Birth/Sex: [birth year] (49 y.o. Male) Other Clinician: [name] Primary Care Physician: [name] Treating Britto, Errol Referring Physician: [name] Physician/Extender: Suella Grove in Treatment: 3 Visit Information History Since Last Visit Added or deleted any medications: No Patient Arrived: Ambulatory Any new allergies or adverse reactions: No Arrival Time: 13:30 Had a fall or experienced change in No Accompanied By: self activities of daily living that may affect Transfer Assistance: None risk of falls: Patient Identification Verified: Yes Signs or symptoms of abuse/neglect No Secondary Verification Process Yes since last visito Completed: Hospitalized since last visit: No Patient Requires Transmission-Based No Has Dressing in Place as Prescribed: Yes Precautions: Has Footwear/Offloading in Place as Yes Patient Has Alerts: Yes Prescribed: Right: Surgical Shoe with Pressure Relief Insole Pain Present Now: No Electronic Signature(s) Signed: 02/01/2015 5:05:02 PM By: Lorine Bears RCP, RRT, CHT Entered By: Lorine Bears on 02/01/2015 14:17:53 [name] ([id number]) -------------------------------------------------------------------------------- Encounter Discharge Information Details [name], [name] 02/01/2015 1:30 Patient Name: Date of Service: [name] Medical Record Patient Account Number: [id number] Number: Treating RN: Date of Birth/Sex: 1965-05-12 (50 y.o. Male) Other Clinician: [name] Primary Care Physician: [name] Treating Christin Fudge Referring Physician: [name] Physician/Extender: Suella Grove in Treatment:  3 Encounter Discharge Information Items Discharge Pain Level: 0 Discharge Condition: Stable Ambulatory Status: Ambulatory Other (Note Discharge Destination: Required) Transportation: Private Auto Accompanied By: self Schedule Follow-up Appointment: No Medication Reconciliation completed and provided to Patient/Care No [name]: Clinical Summary of Care: Notes Patient will go back to work from here. Patient has an HBO treatment scheduled on 02/02/15 at 1:30 pm. Electronic Signature(s) Signed: 02/01/2015 5:05:02 PM By: Lorine Bears RCP, RRT, CHT Entered By: Lorine Bears on 02/01/2015 17:04:20 [name], [name] ([id number]) -------------------------------------------------------------------------------- Vitals Details [name], [name] 02/01/2015 1:30 Patient Name: Date of Service: [name] Medical Record Patient Account Number: [id number] Number: Treating RN: Date of Birth/Sex: 03/12/1965 (50 y.o. Male) Other Clinician: [name] Primary Care Physician: [name] Treating Britto, Errol Referring Physician: [name] Physician/Extender: Suella Grove in Treatment: 3 Vital Signs Time Taken: 13:34 Temperature (F): 97.8 Height (in): 74 Pulse (bpm): 84 Weight (lbs): 288 Respiratory Rate (breaths/min): 18 Body Mass Index (BMI): 37 Blood Pressure (mmHg): 136/58 Capillary Blood Glucose (mg/dl): 120 Reference Range: 80 - 120 mg / dl Electronic Signature(s) Signed: 02/01/2015 5:05:02 PM By: Lorine Bears RCP, RRT, CHT Entered By: Lorine Bears on 02/01/2015 14:18:04

## 2015-02-03 ENCOUNTER — Encounter: Payer: Managed Care, Other (non HMO) | Attending: Internal Medicine | Admitting: Internal Medicine

## 2015-02-03 DIAGNOSIS — I10 Essential (primary) hypertension: Secondary | ICD-10-CM | POA: Diagnosis not present

## 2015-02-03 DIAGNOSIS — E1140 Type 2 diabetes mellitus with diabetic neuropathy, unspecified: Secondary | ICD-10-CM | POA: Diagnosis not present

## 2015-02-03 DIAGNOSIS — E785 Hyperlipidemia, unspecified: Secondary | ICD-10-CM | POA: Diagnosis not present

## 2015-02-03 DIAGNOSIS — M86371 Chronic multifocal osteomyelitis, right ankle and foot: Secondary | ICD-10-CM | POA: Insufficient documentation

## 2015-02-03 DIAGNOSIS — E11621 Type 2 diabetes mellitus with foot ulcer: Secondary | ICD-10-CM | POA: Insufficient documentation

## 2015-02-03 DIAGNOSIS — L84 Corns and callosities: Secondary | ICD-10-CM | POA: Diagnosis not present

## 2015-02-03 DIAGNOSIS — L02611 Cutaneous abscess of right foot: Secondary | ICD-10-CM | POA: Diagnosis not present

## 2015-02-03 DIAGNOSIS — Z794 Long term (current) use of insulin: Secondary | ICD-10-CM | POA: Diagnosis not present

## 2015-02-03 DIAGNOSIS — L97512 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d: Secondary | ICD-10-CM | POA: Diagnosis not present

## 2015-02-03 LAB — GLUCOSE, CAPILLARY
Glucose-Capillary: 146 mg/dL — ABNORMAL HIGH (ref 65–99)
Glucose-Capillary: 84 mg/dL (ref 65–99)

## 2015-02-03 NOTE — Progress Notes (Signed)
[name], [name] ([id number]) Visit Report for 02/02/2015 HBO Details [name], [name] 02/02/2015 1:30 Patient Name: Date of Service: [name] Medical Record Patient Account Number: [id number] Number: Treating RN: Date of Birth/Sex: 1965/09/01 (50 y.o. Male) Other Clinician: Jacqulyn [name] Primary Care Physician: [name] Treating [name], [name] Referring Physician: [name] Physician/Extender: [name] in Treatment: 3 HBO Treatment Course Details Treatment Course Ordering Physician: [name] 1 Number: HBO Treatment Start Date: 02/01/2015 Total Treatments 30 Ordered: HBO Indication: Diabetic Ulcer(s) of the Lower Extremity HBO Treatment Details Treatment Number: 2 Patient Type: Outpatient Chamber Type: Monoplace Chamber Serial #: [id number] Treatment Protocol: 2.0 ATA with 90 minutes oxygen, and no air breaks Treatment Details Compression Rate Down: 1.5 psi / minute De-Compression Rate Up: 1.5 psi / minute Air breaks and breathing Compress Tx Pressure periods Decompress Decompress Begins Reached (leave unused spaces Begins Ends blank) Chamber Pressure (ATA) 1 2 - - - - - - 2 1 Clock Time (24 hr) 13:40 13:52 - - - - - - 15:22 15:32 Treatment Length: 112 (minutes) Treatment Segments: 4 Capillary Blood Glucose Pre Capillary Blood Glucose (mg/dl): Post Capillary Blood Glucose (mg/dl): Vital Signs Capillary Blood Glucose Reference Range: 80 - 120 mg / dl HBO Diabetic Blood Glucose Intervention Range: <131 mg/dl or >249 mg/dl Time Vitals Blood Respiratory Capillary Blood Glucose Pulse Action Type: Pulse: Temperature: Taken: Pressure: Rate: Glucose (mg/dl): Meter #: Oximetry (%) Taken: Pre 13:32 130/74 78 18 98.1 142 1 none Post 15:42 122/82 78 18 98.4 122 1 none [name], [name] J. ([id number]) Treatment Response Treatment Completion Status: Treatment Completed without Adverse Event HBO Attestation I certify that I supervised this HBO  treatment in accordance with Medicare guidelines. A trained Yes emergency response team is readily available per hospital policies and procedures. Continue HBOT as ordered. Yes Electronic Signature(s) Signed: 02/02/2015 4:35:34 [name] By: [name] Fudge MD, FACS Previous Signature: 02/02/2015 4:12:39 [name] Version By: Lorine Bears RCP, RRT, CHT Previous Signature: 02/02/2015 3:33:01 [name] Version By: [name] Fudge MD, FACS Entered By: [name] on 02/02/2015 16:35:34 [name], [name] ([id number]) -------------------------------------------------------------------------------- HBO Safety Checklist Details [name], [name] 02/02/2015 1:30 Patient Name: Date of Service: [name] Medical Record Patient Account Number: [id number] Number: Treating RN: Date of Birth/Sex: 1965/01/20 (50 y.o. Male) Other Clinician: Jacqulyn [name] Primary Care Physician: [name] Treating [name], [name] Referring Physician: [name] Physician/Extender: [name] in Treatment: 3 HBO Safety Checklist Items Safety Checklist Consent Form Signed Patient voided / foley secured and emptied When did you last eato 12:30 [name] Last dose of injectable or oral agent 12:30 [name] NA Ostomy pouch emptied and vented if applicable NA All implantable devices assessed, documented and approved NA Intravenous access site secured and place Valuables secured Linens and cotton and cotton/polyester blend (less than 51% polyester) Personal oil-based products / skin lotions / body lotions removed Wigs or hairpieces removed Smoking or tobacco materials removed Books / newspapers / magazines / loose paper removed Cologne, aftershave, perfume and deodorant removed Jewelry removed (may wrap wedding band) Make-up removed Hair care products removed Battery operated devices (external) removed Heating patches and chemical warmers removed NA Titanium eyewear removed NA Nail polish cured greater than 10  hours NA Casting material cured greater than 10 hours NA Hearing aids removed NA Loose dentures or partials removed NA Prosthetics have been removed Patient demonstrates correct use of air break device (if applicable) Patient concerns have been addressed Patient grounding bracelet on and cord attached to chamber Specifics for Inpatients (complete in addition  to above) Medication sheet sent with patient LARSON, RAITT ([id number]) Intravenous medications needed or due during therapy sent with patient Drainage tubes (e.g. nasogastric tube or chest tube secured and vented) Endotracheal or Tracheotomy tube secured Cuff deflated of air and inflated with saline Airway suctioned Electronic Signature(s) Signed: 02/02/2015 4:12:39 [name] By: Lorine Bears RCP, RRT, CHT Entered By: Lorine Bears on 02/02/2015 13:48:34

## 2015-02-03 NOTE — Progress Notes (Signed)
[name], [name] ([id number]) Visit Report for 02/01/2015 HBO Details [name], [name] 02/01/2015 1:30 Patient Name: Date of Service: [name] Medical Record Patient Account Number: [id number] Number: Treating RN: Date of Birth/Sex: 1965-03-03 (50 y.o. Male) Other Clinician: [name] Primary Care Physician: [name] Treating Britto, Errol Referring Physician: [name] Physician/Extender: Suella Grove in Treatment: 3 HBO Treatment Course Details Treatment Course Ordering Physician: Christin Fudge 1 Number: HBO Treatment Start Date: 02/01/2015 Total Treatments 30 Ordered: HBO Indication: Diabetic Ulcer(s) of the Lower Extremity HBO Treatment Details Treatment Number: 1 Patient Type: Outpatient Chamber Type: Monoplace Chamber Serial #: [id number] Treatment Protocol: 2.0 ATA with 90 minutes oxygen, and no air breaks Treatment Details Compression Rate Down: 1.5 psi / minute De-Compression Rate Up: 1.5 psi / minute Air breaks and breathing Compress Tx Pressure periods Decompress Decompress Begins Reached (leave unused spaces Begins Ends blank) Chamber Pressure (ATA) 1 2 - - - - - - 2 1 Clock Time (24 hr) 14:04 14:16 - - - - - - 15:46 15:57 Treatment Length: 113 (minutes) Treatment Segments: 4 Capillary Blood Glucose Pre Capillary Blood Glucose (mg/dl): Post Capillary Blood Glucose (mg/dl): Vital Signs Capillary Blood Glucose Reference Range: 80 - 120 mg / dl HBO Diabetic Blood Glucose Intervention Range: <131 mg/dl or >249 mg/dl Time Vitals Blood Respiratory Capillary Blood Glucose Pulse Action Type: Pulse: Temperature: Taken: Pressure: Rate: Glucose (mg/dl): Meter #: Oximetry (%) Taken: Pre 13:34 136/58 84 18 97.8 120 1 gave Ensure per protocol Pre 14:03 140 1 proceed with treatment per protocol Post 16:01 136/70 78 18 97.9 130 1 none [name], [name] J. ([id number]) Pre-Treatment Ear Evaluation Left Right Clear: Yes Clear: Yes Intact:  Yes Intact: Yes Left Teed Scale: Grade 0 Right Teed Scale: Grade 0 Treatment Response Treatment Completion Status: Treatment Completed without Adverse Event Physician Notes seen pre-procedure and besides the ENT check heart and lungs are within normal limits. HBO Attestation I certify that I supervised this HBO treatment in accordance with Medicare guidelines. A trained Yes emergency response team is readily available per hospital policies and procedures. Continue HBOT as ordered. Yes Electronic Signature(s) Signed: 02/01/2015 5:05:02 PM By: Paulla Fore, RRT, CHT Signed: 02/02/2015 3:33:01 PM By: Christin Fudge MD, FACS Previous Signature: 02/01/2015 4:24:53 PM Version By: Christin Fudge MD, FACS Entered By: Lorine Bears on 02/01/2015 17:03:24 [name], [name] ([id number]) -------------------------------------------------------------------------------- HBO Safety Checklist Details [name], [name] 02/01/2015 1:30 Patient Name: Date of Service: [name] Medical Record Patient Account Number: [id number] Number: Treating RN: Date of Birth/Sex: 1965-12-27 (50 y.o. Male) Other Clinician: [name] Primary Care Physician: [name] Treating Britto, Errol Referring Physician: [name] Physician/Extender: Suella Grove in Treatment: 3 HBO Safety Checklist Items Safety Checklist Consent Form Signed Patient voided / foley secured and emptied When did you last eato 12:30 pm Last dose of injectable or oral agent 11:30 am NA Ostomy pouch emptied and vented if applicable NA All implantable devices assessed, documented and approved NA Intravenous access site secured and place Valuables secured Linens and cotton and cotton/polyester blend (less than 51% polyester) Personal oil-based products / skin lotions / body lotions removed Wigs or hairpieces removed Smoking or tobacco materials removed Books / newspapers / magazines /  loose paper removed Cologne, aftershave, perfume and deodorant removed Jewelry removed (may wrap wedding band) Make-up removed Hair care products removed Battery operated devices (external) removed Heating patches and chemical warmers removed NA Titanium eyewear removed NA Nail polish cured greater than 10 hours NA Casting material  cured greater than 10 hours NA Hearing aids removed NA Loose dentures or partials removed NA Prosthetics have been removed Patient demonstrates correct use of air break device (if applicable) Patient concerns have been addressed Patient grounding bracelet on and cord attached to chamber Specifics for Inpatients (complete in addition to above) Medication sheet sent with patient [name], [name] ([id number]) Intravenous medications needed or due during therapy sent with patient Drainage tubes (e.g. nasogastric tube or chest tube secured and vented) Endotracheal or Tracheotomy tube secured Cuff deflated of air and inflated with saline Airway suctioned Electronic Signature(s) Signed: 02/01/2015 5:05:02 PM By: Lorine Bears RCP, RRT, CHT Entered By: Lorine Bears on 02/01/2015 13:56:47

## 2015-02-03 NOTE — Progress Notes (Signed)
[name], [name] ([id number]) Visit Report for 02/02/2015 Arrival Information Details [name], [name] 02/02/2015 1:30 Patient Name: Date of Service: [name] Medical Record Patient Account Number: [id number] Number: Treating RN: Date of Birth/Sex: 1965/05/05 (50 y.o. Male) Other Clinician: [name] Primary Care Physician: [name] Treating Britto, Errol Referring Physician: [name] Physician/Extender: Suella Grove in Treatment: 3 Visit Information History Since Last Visit Added or deleted any medications: No Patient Arrived: Ambulatory Any new allergies or adverse reactions: No Arrival Time: 13:30 Had a fall or experienced change in No Accompanied By: self activities of daily living that may affect Transfer Assistance: None risk of falls: Patient Identification Verified: Yes Signs or symptoms of abuse/neglect No Secondary Verification Process Yes since last visito Completed: Hospitalized since last visit: No Patient Requires Transmission-Based No Has Dressing in Place as Prescribed: Yes Precautions: Has Footwear/Offloading in Place as Yes Patient Has Alerts: Yes Prescribed: Left: Surgical Shoe with Pressure Relief Insole Pain Present Now: No Electronic Signature(s) Signed: 02/02/2015 4:12:39 PM By: Lorine Bears RCP, RRT, CHT Entered By: Lorine Bears on 02/02/2015 13:47:04 [name] -------------------------------------------------------------------------------- Encounter Discharge Information Details [name], [name] 02/02/2015 1:30 Patient Name: Date of Service: [name] Medical Record Patient Account Number: [id number] Number: Treating RN: Date of Birth/Sex: 1965-06-13 (50 y.o. Male) Other Clinician: [name] Primary Care Physician: [name] Treating Christin Fudge Referring Physician: [name] Physician/Extender: Suella Grove in Treatment:  3 Encounter Discharge Information Items Discharge Pain Level: 0 Discharge Condition: Stable Ambulatory Status: Ambulatory Other (Note Discharge Destination: Required) Transportation: Private Auto Accompanied By: self Schedule Follow-up Appointment: No Medication Reconciliation completed and provided to Patient/Care No [name]: Clinical Summary of Care: Notes Patient is on his way back to work from here. Patient has an HBO treatment scheduled on 02/03/15 at 13:30 pm. Electronic Signature(s) Signed: 02/02/2015 4:12:39 PM By: Lorine Bears RCP, RRT, CHT Entered By: Lorine Bears on 02/02/2015 16:10:19 [name], [name] ([id number]) -------------------------------------------------------------------------------- Vitals Details [name], [name] 02/02/2015 1:30 Patient Name: Date of Service: [name] Medical Record Patient Account Number: [id number] Number: Treating RN: Date of Birth/Sex: 1965/12/19 (50 y.o. Male) Other Clinician: [name] Primary Care Physician: [name] Treating Britto, Errol Referring Physician: [name] Physician/Extender: Suella Grove in Treatment: 3 Vital Signs Time Taken: 13:32 Temperature (F): 98.1 Height (in): 74 Pulse (bpm): 78 Weight (lbs): 288 Respiratory Rate (breaths/min): 18 Body Mass Index (BMI): 37 Blood Pressure (mmHg): 130/74 Capillary Blood Glucose (mg/dl): 142 Reference Range: 80 - 120 mg / dl Electronic Signature(s) Signed: 02/02/2015 4:12:39 PM By: Lorine Bears RCP, RRT, CHT Entered By: Lorine Bears on 02/02/2015 13:47:55

## 2015-02-04 ENCOUNTER — Encounter: Payer: Managed Care, Other (non HMO) | Admitting: Surgery

## 2015-02-04 ENCOUNTER — Inpatient Hospital Stay
Admission: AD | Admit: 2015-02-04 | Discharge: 2015-02-05 | DRG: 638 | Disposition: A | Discharge status: Home Health | Payer: Managed Care, Other (non HMO) | Source: Ambulatory Visit | Attending: Internal Medicine | Admitting: Internal Medicine

## 2015-02-04 DIAGNOSIS — E785 Hyperlipidemia, unspecified: Secondary | ICD-10-CM | POA: Diagnosis not present

## 2015-02-04 DIAGNOSIS — Z794 Long term (current) use of insulin: Secondary | ICD-10-CM | POA: Diagnosis not present

## 2015-02-04 DIAGNOSIS — Z7982 Long term (current) use of aspirin: Secondary | ICD-10-CM

## 2015-02-04 DIAGNOSIS — M869 Osteomyelitis, unspecified: Secondary | ICD-10-CM | POA: Diagnosis present

## 2015-02-04 DIAGNOSIS — M86371 Chronic multifocal osteomyelitis, right ankle and foot: Secondary | ICD-10-CM | POA: Diagnosis not present

## 2015-02-04 DIAGNOSIS — I10 Essential (primary) hypertension: Secondary | ICD-10-CM | POA: Diagnosis present

## 2015-02-04 DIAGNOSIS — Z95828 Presence of other vascular implants and grafts: Secondary | ICD-10-CM

## 2015-02-04 DIAGNOSIS — Z7984 Long term (current) use of oral hypoglycemic drugs: Secondary | ICD-10-CM

## 2015-02-04 DIAGNOSIS — M868X7 Other osteomyelitis, ankle and foot: Secondary | ICD-10-CM | POA: Diagnosis present

## 2015-02-04 DIAGNOSIS — Z79899 Other long term (current) drug therapy: Secondary | ICD-10-CM

## 2015-02-04 DIAGNOSIS — M868X6 Other osteomyelitis, lower leg: Secondary | ICD-10-CM | POA: Diagnosis present

## 2015-02-04 DIAGNOSIS — Z79891 Long term (current) use of opiate analgesic: Secondary | ICD-10-CM

## 2015-02-04 DIAGNOSIS — E11621 Type 2 diabetes mellitus with foot ulcer: Secondary | ICD-10-CM | POA: Diagnosis present

## 2015-02-04 DIAGNOSIS — E1140 Type 2 diabetes mellitus with diabetic neuropathy, unspecified: Secondary | ICD-10-CM | POA: Diagnosis not present

## 2015-02-04 DIAGNOSIS — L84 Corns and callosities: Secondary | ICD-10-CM | POA: Diagnosis not present

## 2015-02-04 DIAGNOSIS — E1169 Type 2 diabetes mellitus with other specified complication: Secondary | ICD-10-CM | POA: Diagnosis present

## 2015-02-04 DIAGNOSIS — L02611 Cutaneous abscess of right foot: Secondary | ICD-10-CM | POA: Diagnosis not present

## 2015-02-04 DIAGNOSIS — L97512 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d: Secondary | ICD-10-CM | POA: Diagnosis not present

## 2015-02-04 LAB — BASIC METABOLIC PANEL
Anion gap: 8 (ref 5–15)
BUN: 22 mg/dL — ABNORMAL HIGH (ref 6–20)
CO2: 27 mmol/L (ref 22–32)
Calcium: 8.6 mg/dL — ABNORMAL LOW (ref 8.9–10.3)
Chloride: 100 mmol/L — ABNORMAL LOW (ref 101–111)
Creatinine, Ser: 1.03 mg/dL (ref 0.61–1.24)
GFR calc Af Amer: >60 mL/min (ref >60)
GFR calc non Af Amer: >60 mL/min (ref >60)
Glucose, Bld: 165 mg/dL — ABNORMAL HIGH (ref 65–99)
Potassium: 4.4 mmol/L (ref 3.5–5.1)
Sodium: 135 mmol/L (ref 135–145)

## 2015-02-04 LAB — CBC
HCT: 41.7 % (ref 40.0–52.0)
Hemoglobin: 14 g/dL (ref 13.0–18.0)
MCH: 29.1 pg (ref 26.0–34.0)
MCHC: 33.5 g/dL (ref 32.0–36.0)
MCV: 86.8 fL (ref 80.0–100.0)
Platelets: 224 10*3/uL (ref 150–440)
RBC: 4.8 MIL/uL (ref 4.40–5.90)
RDW: 13.7 % (ref 11.5–14.5)
WBC: 15.7 10*3/uL — ABNORMAL HIGH (ref 3.8–10.6)

## 2015-02-04 LAB — GLUCOSE, CAPILLARY
Glucose-Capillary: 149 mg/dL — ABNORMAL HIGH (ref 65–99)
Glucose-Capillary: 165 mg/dL — ABNORMAL HIGH (ref 65–99)
Glucose-Capillary: 160 mg/dL — ABNORMAL HIGH (ref 65–99)

## 2015-02-04 MED ORDER — METHOCARBAMOL 500 MG PO TABS
500.0000 mg | ORAL_TABLET | Freq: Two times a day (BID) | ORAL | Status: DC
  Administered 2015-02-04: 500 mg via ORAL
  Filled 2015-02-04: qty 1

## 2015-02-04 MED ORDER — SODIUM CHLORIDE 0.9% FLUSH
3.0000 mL | Freq: Two times a day (BID) | INTRAVENOUS | Status: DC
  Administered 2015-02-04 – 2015-02-05 (×2): 3 mL via INTRAVENOUS

## 2015-02-04 MED ORDER — IRBESARTAN 150 MG PO TABS
300.0000 mg | ORAL_TABLET | Freq: Every day | ORAL | Status: DC
  Administered 2015-02-05: 300 mg via ORAL
  Filled 2015-02-04 (×2): qty 2

## 2015-02-04 MED ORDER — SODIUM CHLORIDE 0.9% FLUSH: 3.0000 mL | INTRAVENOUS | Status: DC | PRN

## 2015-02-04 MED ORDER — ONDANSETRON HCL 4 MG PO TABS: 4.0000 mg | ORAL_TABLET | Freq: Four times a day (QID) | ORAL | Status: DC | PRN

## 2015-02-04 MED ORDER — TRAMADOL HCL 50 MG PO TABS
50.0000 mg | ORAL_TABLET | Freq: Four times a day (QID) | ORAL | Status: DC | PRN
  Administered 2015-02-04 – 2015-02-05 (×2): 50 mg via ORAL
  Filled 2015-02-04 (×2): qty 1

## 2015-02-04 MED ORDER — ALBUTEROL SULFATE (2.5 MG/3ML) 0.083% IN NEBU: 2.5000 mg | INHALATION_SOLUTION | RESPIRATORY_TRACT | Status: DC | PRN

## 2015-02-04 MED ORDER — INSULIN GLARGINE 100 UNIT/ML ~~LOC~~ SOLN
100.0000 [IU] | Freq: Every day | SUBCUTANEOUS | Status: DC
  Filled 2015-02-04: qty 1

## 2015-02-04 MED ORDER — INSULIN ASPART 100 UNIT/ML ~~LOC~~ SOLN: 0.0000 [IU] | Freq: Every day | SUBCUTANEOUS | Status: DC

## 2015-02-04 MED ORDER — ACETAMINOPHEN 325 MG PO TABS: 650.0000 mg | ORAL_TABLET | Freq: Four times a day (QID) | ORAL | Status: DC | PRN

## 2015-02-04 MED ORDER — ONDANSETRON HCL 4 MG/2ML IJ SOLN: 4.0000 mg | Freq: Four times a day (QID) | INTRAMUSCULAR | Status: DC | PRN

## 2015-02-04 MED ORDER — AMLODIPINE BESYLATE 10 MG PO TABS
10.0000 mg | ORAL_TABLET | Freq: Every day | ORAL | Status: DC
  Administered 2015-02-05: 10 mg via ORAL
  Filled 2015-02-04 (×2): qty 1

## 2015-02-04 MED ORDER — HYDROCHLOROTHIAZIDE 25 MG PO TABS
25.0000 mg | ORAL_TABLET | Freq: Every day | ORAL | Status: DC
  Administered 2015-02-05: 25 mg via ORAL
  Filled 2015-02-04 (×2): qty 1

## 2015-02-04 MED ORDER — SODIUM CHLORIDE 0.9 % IV SOLN: 250.0000 mL | INTRAVENOUS | Status: DC | PRN

## 2015-02-04 MED ORDER — OLMESARTAN-AMLODIPINE-HCTZ 40-10-25 MG PO TABS: 1.0000 | ORAL_TABLET | Freq: Every day | ORAL | Status: DC

## 2015-02-04 MED ORDER — MORPHINE SULFATE (PF) 4 MG/ML IV SOLN
4.0000 mg | INTRAVENOUS | Status: DC | PRN
  Administered 2015-02-05: 4 mg via INTRAVENOUS
  Filled 2015-02-04: qty 1

## 2015-02-04 MED ORDER — ACETAMINOPHEN 650 MG RE SUPP: 650.0000 mg | Freq: Four times a day (QID) | RECTAL | Status: DC | PRN

## 2015-02-04 MED ORDER — INSULIN ASPART 100 UNIT/ML ~~LOC~~ SOLN
0.0000 [IU] | Freq: Three times a day (TID) | SUBCUTANEOUS | Status: DC
  Administered 2015-02-05: 1 [IU] via SUBCUTANEOUS
  Filled 2015-02-04: qty 1

## 2015-02-04 MED ORDER — ASPIRIN EC 81 MG PO TBEC
81.0000 mg | DELAYED_RELEASE_TABLET | Freq: Every day | ORAL | Status: DC
  Administered 2015-02-04 – 2015-02-05 (×2): 81 mg via ORAL
  Filled 2015-02-04 (×2): qty 1

## 2015-02-04 MED ORDER — HEPARIN SODIUM (PORCINE) 5000 UNIT/ML IJ SOLN
5000.0000 [IU] | Freq: Three times a day (TID) | INTRAMUSCULAR | Status: DC
  Administered 2015-02-04 – 2015-02-05 (×2): 5000 [IU] via SUBCUTANEOUS
  Filled 2015-02-04 (×2): qty 1

## 2015-02-04 NOTE — Progress Notes (Signed)
[name], [name] ([id number]) Visit Report for 02/03/2015 HBO Details [name] Date of Service: 02/03/2015 1:30 PM Patient Name: [name]. Patient Account Number: [id number] Medical Record Treating RN: [id number] Number: Other Clinician: Jacqulyn [name] Date of Birth/Sex: 10-10-1965 (50 y.o. Male) Treating [name], [name] Primary Care Physician/Extender: [name] Physician: Referring Physician: Sharilyn [name] in Treatment: 3 HBO Treatment Course Details Treatment Course Ordering Physician: [name] 1 Number: HBO Treatment Start Date: 02/01/2015 Total Treatments 30 Ordered: HBO Indication: Diabetic Ulcer(s) of the Lower Extremity HBO Treatment Details Treatment Number: 3 Patient Type: Outpatient Chamber Type: Monoplace Chamber Serial #: [id number] Treatment Protocol: 2.0 ATA with 90 minutes oxygen, and no air breaks Treatment Details Compression Rate Down: 1.5 psi / minute De-Compression Rate Up: 1.5 psi / minute Air breaks and breathing Compress Tx Pressure periods Decompress Decompress Begins Reached (leave unused spaces Begins Ends blank) Chamber Pressure (ATA) 1 2 - - - - - - 2 1 Clock Time (24 hr) 13:30 13:42 - - - - - - 15:12 15:22 Treatment Length: 112 (minutes) Treatment Segments: 4 Capillary Blood Glucose Pre Capillary Blood Glucose (mg/dl): Post Capillary Blood Glucose (mg/dl): Vital Signs Capillary Blood Glucose Reference Range: 80 - 120 mg / dl HBO Diabetic Blood Glucose Intervention Range: <131 mg/dl or >249 mg/dl Time Capillary Blood Pulse Blood Respiratory Glucose Action Type: Vitals Pulse: Temperature: Glucose Oximetry Pressure: Rate: Meter #: Taken: Taken: (mg/dl): (%) Pre 13:21 124/62 84 18 97.8 146 1 none Post 15:27 136/72 78 18 97.9 84 1 [name], [name] [name]. ([id number]) gave Ensure - patient ok and going to eat Treatment Response Treatment Completion Status: Treatment Completed without Adverse Event Physician  Notes No concerns with treatment given HBO Attestation I certify that I supervised this HBO treatment in accordance with Medicare guidelines. A trained Yes emergency response team is readily available per hospital policies and procedures. Continue HBOT as ordered. Yes Electronic Signature(s) Signed: 02/03/2015 4:36:57 PM By: [name] Ham MD Previous Signature: 02/03/2015 4:07:26 PM Version By: [name], [name], [name], [name] Entered By: [name] on 02/03/2015 16:35:37 [name] ([id number]) -------------------------------------------------------------------------------- HBO Safety Checklist Details [name] Date of Service: 02/03/2015 1:30 PM Patient Name: [name]. Patient Account Number: [id number] Medical Record Treating RN: [id number] Number: Other Clinician: Jacqulyn [name] Date of Birth/Sex: 1965/01/07 (50 y.o. Male) Treating [name], [name] Primary Care Physician/Extender: [name] Physician: Referring Physician: Sharilyn [name] in Treatment: 3 HBO Safety Checklist Items Safety Checklist Consent Form Signed Patient voided / foley secured and emptied When did you last eato 11:00 am Last dose of injectable or oral agent 11:00 am NA Ostomy pouch emptied and vented if applicable NA All implantable devices assessed, documented and approved NA Intravenous access site secured and place Valuables secured Linens and cotton and cotton/polyester blend (less than 51% polyester) Personal oil-based products / skin lotions / body lotions removed Wigs or hairpieces removed Smoking or tobacco materials removed Books / newspapers / magazines / loose paper removed Cologne, aftershave, perfume and deodorant removed Jewelry removed (may wrap wedding band) Make-up removed Hair care products removed Battery operated devices (external) removed Heating patches and chemical warmers removed NA Titanium eyewear removed NA Nail polish cured  greater than 10 hours NA Casting material cured greater than 10 hours NA Hearing aids removed NA Loose dentures or partials removed NA Prosthetics have been removed Patient demonstrates correct use of air break device (if applicable) Patient concerns have been addressed Patient grounding bracelet on and cord attached to chamber Specifics  for Inpatients (complete in addition to above) Medication sheet sent with patient [name], [name] ([id number]) Intravenous medications needed or due during therapy sent with patient Drainage tubes (e.g. nasogastric tube or chest tube secured and vented) Endotracheal or Tracheotomy tube secured Cuff deflated of air and inflated with saline Airway suctioned Electronic Signature(s) Signed: 02/03/2015 4:07:26 PM By: [name] [name], [name], [name] Entered By: [name] on 02/03/2015 13:43:17

## 2015-02-04 NOTE — H&P (Signed)
[facility] [place] NAME: [name]    MR#:  [id number]  DATE OF BIRTH:  [birth year]  DATE OF ADMISSION:  02/04/2015  PRIMARY CARE PHYSICIAN: Tivis Ringer, MD   REQUESTING/REFERRING PHYSICIAN: Dr. Candee Furbish  CHIEF COMPLAINT:  No chief complaint on file.  right foot cellulitis and osteomyelitis   HISTORY OF PRESENT ILLNESS:  [name]  is a 50 y.o. male with a known history of hypertension and diabetes. The patient got the right foot I&D 2-3 weeks ago in wound care center. But the right foot infection is getting worse, so he was sent for direct admission by wound [facility] AND ID CONSULT FOR ANTIBIOTICS RECOMMENDATION. THE PATIENT DENIES ANY FEVER OR CHILLS. HE DENIES ANY OTHER SYMPTOMS EXCEPT THE RIGHT FOOT PAIN.   PAST MEDICAL HISTORY:   Past Medical History  Diagnosis Date  . Diabetes mellitus without complication ([place])   . Hypertension     PAST SURGICAL HISTORY:  History reviewed. No pertinent past surgical history.  SOCIAL HISTORY:   Social History  Substance Use Topics  . Smoking status: Never Smoker   . Smokeless tobacco: Not on file  . Alcohol Use: No    FAMILY HISTORY:  History reviewed. No pertinent family history. hypertension and diabetes. No CVA or heart attack.  DRUG ALLERGIES:  No Known Allergies  REVIEW OF SYSTEMS:  CONSTITUTIONAL: No fever, fatigue or weakness.  EYES: No blurred or double vision.  EARS, NOSE, AND THROAT: No tinnitus or ear pain.  RESPIRATORY: No cough, shortness of breath, wheezing or hemoptysis.  CARDIOVASCULAR: No chest pain, orthopnea, edema.  GASTROINTESTINAL: No nausea, vomiting, diarrhea or abdominal pain.  GENITOURINARY: No dysuria, hematuria.  ENDOCRINE: No polyuria, nocturia,  HEMATOLOGY: No anemia, easy bruising or bleeding SKIN: No rash or lesion. MUSCULOSKELETAL: right foot pain   NEUROLOGIC: No tingling, numbness, weakness.  PSYCHIATRY: No anxiety or depression.   MEDICATIONS AT HOME:   Prior to Admission medications   Medication Sig Start Date End Date Taking? Authorizing Provider  amoxicillin-clavulanate (AUGMENTIN) 875-125 MG tablet Take 1 tablet by mouth 2 (two) times daily. 12/31/14  Yes Tanna Furry, MD  aspirin EC 81 MG tablet Take 81 mg by mouth daily.   Yes Historical Provider, MD  doxycycline (VIBRAMYCIN) 100 MG capsule Take 1 capsule (100 mg total) by mouth 2 (two) times daily. 12/31/14  Yes Tanna Furry, MD  glimepiride (AMARYL) 2 MG tablet Take 2 mg by mouth 2 (two) times daily. 10/26/14  Yes Historical Provider, MD  INVOKANA 300 MG TABS tablet Take 300 mg by mouth daily. 11/09/14  Yes Historical Provider, MD  metFORMIN (GLUCOPHAGE) 1000 MG tablet Take 1,000 mg by mouth 2 (two) times daily. 11/09/14  Yes Historical Provider, MD  methocarbamol (ROBAXIN) 500 MG tablet Take 1 tablet (500 mg total) by mouth 2 (two) times daily. 04/30/14  Yes Heather Laisure, PA-C  Olmesartan-Amlodipine-HCTZ 40-10-25 MG TABS Take 1 tablet by mouth daily. Reported on 02/04/2015 12/28/14  Yes Historical Provider, MD  TRESIBA FLEXTOUCH 200 UNIT/ML SOPN Inject 100 Units into the skin daily. 12/28/14  Yes Historical Provider, MD  naproxen (NAPROSYN) 500 MG tablet Take 1 tablet (500 mg total) by mouth 2 (two) times daily. Patient not taking: Reported on 12/31/2014 04/30/14   Hyman Bible, PA-C  traMADol (ULTRAM) 50 MG tablet Take 1 tablet (50 mg total) by mouth every 6 (six) hours as needed.  Patient not taking: Reported on 12/31/2014 04/30/14   Hyman Bible, PA-C      VITAL SIGNS:  There were no vitals taken for this visit.  PHYSICAL EXAMINATION:  GENERAL:  50 y.o.-year-old patient lying in the bed with no acute distress.  obese.  EYES: Pupils equal, round, reactive to light and accommodation. No scleral icterus. Extraocular muscles intact.  HEENT: Head atraumatic, normocephalic.  Oropharynx and nasopharynx clear.  NECK:  Supple, no jugular venous distention. No thyroid enlargement, no tenderness.  LUNGS: Normal breath sounds bilaterally, no wheezing, rales,rhonchi or crepitation. No use of accessory muscles of respiration.  CARDIOVASCULAR: S1, S2 normal. No murmurs, rubs, or gallops.  ABDOMEN: Soft, nontender, nondistended. Bowel sounds present. No organomegaly or mass.  EXTREMITIES: No pedal edema, cyanosis, or clubbing.  tenderness and swelling on right foot with dressing.  NEUROLOGIC: Cranial nerves II through XII are intact. Muscle strength 5/5 in all extremities. Sensation intact. Gait not checked.  PSYCHIATRIC: The patient is alert and oriented x 3.  SKIN: No obvious rash, lesion, or ulcer.   LABORATORY PANEL:   CBC No results for input(s): WBC, HGB, HCT, PLT in the last 168 hours. ------------------------------------------------------------------------------------------------------------------  Chemistries  No results for input(s): NA, K, CL, CO2, GLUCOSE, BUN, CREATININE, CALCIUM, MG, AST, ALT, ALKPHOS, BILITOT in the last 168 hours.  Invalid input(s): GFRCGP ------------------------------------------------------------------------------------------------------------------  Cardiac Enzymes No results for input(s): TROPONINI in the last 168 hours. ------------------------------------------------------------------------------------------------------------------  RADIOLOGY:  No results found.  EKG:   Orders placed or performed during the hospital encounter of 01/19/15  . EKG 12-Lead  . EKG 12-Lead  . EKG 12-Lead  . EKG 12-Lead    IMPRESSION AND PLAN:   Right foot cellulitis and osteomyelitis The patient is admitted directly to medical floor. Get CBC and BMP. I will get PICC line and the ID consult from Dr. [name] per Dr. Fayrene Fearing recommendation.  Hypertension. Continue hypertension medication.  Diabetes. Start sliding scale and continue  tresiba. Obesity    All the records are reviewed and case discussed with ED provider. Management plans discussed with the patient, family and they are in agreement.  CODE STATUS: Full code  TOTAL TIME TAKING CARE OF THIS PATIENT: 53 minutes.    Demetrios Loll M.D on 02/04/2015 at 7:05 PM  Between 7am to 6pm - Pager - [phone]  After 6pm go to [email or web address] - password [email or web address] [email or web address]  Office  [phone]  CC: Primary care physician; Tivis Ringer, MD

## 2015-02-04 NOTE — Progress Notes (Signed)
JAMEIRE, HOOTS ([id number]) Visit Report for 02/03/2015 Arrival Information Details [name] Date of Service: 02/03/2015 1:30 PM Patient Name: J. Patient Account Number: [id number] Medical Record Treating RN: [id number] Number: Other Clinician: [name] Date of Birth/Sex: Jul 09, 1965 (50 y.o. Male) Treating [name], [name] Primary Care Physician/Extender: Floyde Parkins Physician: Referring Physician: Sharilyn Sites in Treatment: 3 Visit Information History Since Last Visit Added or deleted any medications: No Patient Arrived: Ambulatory Any new allergies or adverse reactions: No Arrival Time: 13:20 Had a fall or experienced change in No Accompanied By: self activities of daily living that may affect Transfer Assistance: None risk of falls: Patient Identification Verified: Yes Signs or symptoms of abuse/neglect No Secondary Verification Process Yes since last visito Completed: Hospitalized since last visit: No Patient Requires Transmission-Based No Has Dressing in Place as Prescribed: Yes Precautions: Has Footwear/Offloading in Place as Yes Patient Has Alerts: Yes Prescribed: Left: Surgical Shoe with Pressure Relief Insole Pain Present Now: No Electronic Signature(s) Signed: 02/03/2015 4:07:26 PM By: Lorine Bears RCP, RRT, CHT Entered By: Becky Sax, Amado Nash on 02/03/2015 13:41:19 [name] ([id number]) -------------------------------------------------------------------------------- Encounter Discharge Information Details [name] Date of Service: 02/03/2015 1:30 PM Patient Name: J. Patient Account Number: [id number] Medical Record Treating RN: [id number] Number: Other Clinician: [name] Date of Birth/Sex: Dec 27, 1965 (50 y.o. Male) Treating [name], [name] Primary Care Physician/Extender: Floyde Parkins Physician: Referring Physician: Sharilyn Sites in Treatment:  3 Encounter Discharge Information Items Discharge Pain Level: 0 Discharge Condition: Stable Ambulatory Status: Ambulatory Other (Note Discharge Destination: Required) Transportation: Private Auto Accompanied By: self Schedule Follow-up Appointment: No Medication Reconciliation completed and provided to Patient/Care No Jeovani Weisenburger: Clinical Summary of Care: Notes Patient is going to eat and then to work. Patient has an HBO treatment scheduled on 02/04/15 at 13:30 PM. Electronic Signature(s) Signed: 02/03/2015 4:07:26 PM By: Lorine Bears RCP, RRT, CHT Entered By: Becky Sax, Amado Nash on 02/03/2015 15:39:28 [name] ([id number]) -------------------------------------------------------------------------------- Vitals Details [name] Date of Service: 02/03/2015 1:30 PM Patient Name: J. Patient Account Number: [id number] Medical Record Treating RN: [id number] Number: Other Clinician: [name] Date of Birth/Sex: [birth year] (50 y.o. Male) Treating [name], [name] Primary Care Physician/Extender: Floyde Parkins Physician: Referring Physician: Sharilyn Sites in Treatment: 3 Vital Signs Time Taken: 13:21 Temperature (F): 97.8 Height (in): 74 Pulse (bpm): 84 Weight (lbs): 288 Respiratory Rate (breaths/min): 18 Body Mass Index (BMI): 37 Blood Pressure (mmHg): 124/62 Capillary Blood Glucose (mg/dl): 146 Reference Range: 80 - 120 mg / dl Electronic Signature(s) Signed: 02/03/2015 4:07:26 PM By: Lorine Bears RCP, RRT, CHT Entered By: Becky Sax, Amado Nash on 02/03/2015 13:42:02

## 2015-02-05 ENCOUNTER — Ambulatory Visit: Payer: Managed Care, Other (non HMO)

## 2015-02-05 ENCOUNTER — Inpatient Hospital Stay: Payer: Managed Care, Other (non HMO)

## 2015-02-05 ENCOUNTER — Encounter: Payer: Managed Care, Other (non HMO) | Admitting: Surgery

## 2015-02-05 LAB — BASIC METABOLIC PANEL
Anion gap: 8 (ref 5–15)
BUN: 21 mg/dL — ABNORMAL HIGH (ref 6–20)
CO2: 27 mmol/L (ref 22–32)
Calcium: 8.4 mg/dL — ABNORMAL LOW (ref 8.9–10.3)
Chloride: 101 mmol/L (ref 101–111)
Creatinine, Ser: 0.89 mg/dL (ref 0.61–1.24)
GFR calc Af Amer: >60 mL/min (ref >60)
GFR calc non Af Amer: >60 mL/min (ref >60)
Glucose, Bld: 82 mg/dL (ref 65–99)
Potassium: 3.9 mmol/L (ref 3.5–5.1)
Sodium: 136 mmol/L (ref 135–145)

## 2015-02-05 LAB — CBC
HCT: 40.6 % (ref 40.0–52.0)
Hemoglobin: 13.8 g/dL (ref 13.0–18.0)
MCH: 28.9 pg (ref 26.0–34.0)
MCHC: 33.9 g/dL (ref 32.0–36.0)
MCV: 85.3 fL (ref 80.0–100.0)
Platelets: 225 10*3/uL (ref 150–440)
RBC: 4.76 MIL/uL (ref 4.40–5.90)
RDW: 13.6 % (ref 11.5–14.5)
WBC: 16.6 10*3/uL — ABNORMAL HIGH (ref 3.8–10.6)

## 2015-02-05 LAB — GLUCOSE, CAPILLARY
Glucose-Capillary: 130 mg/dL — ABNORMAL HIGH (ref 65–99)
Glucose-Capillary: 119 mg/dL — ABNORMAL HIGH (ref 65–99)

## 2015-02-05 LAB — SEDIMENTATION RATE: Sed Rate: 60 mm/hr — ABNORMAL HIGH (ref 0–15)

## 2015-02-05 LAB — MRSA PCR SCREENING: MRSA by PCR: NEGATIVE

## 2015-02-05 MED ORDER — OXYCODONE-ACETAMINOPHEN 5-325 MG PO TABS: 1.0000 | ORAL_TABLET | Freq: Four times a day (QID) | ORAL | Status: DC | PRN

## 2015-02-05 MED ORDER — LEVOFLOXACIN IN D5W 750 MG/150ML IV SOLN
750.0000 mg | INTRAVENOUS | Status: DC
  Administered 2015-02-05: 750 mg via INTRAVENOUS
  Filled 2015-02-05: qty 150

## 2015-02-05 MED ORDER — DEXTROSE 5 % IV SOLN
2.0000 g | Freq: Two times a day (BID) | INTRAVENOUS | Status: DC
  Administered 2015-02-05: 2 g via INTRAVENOUS
  Filled 2015-02-05 (×2): qty 2

## 2015-02-05 MED ORDER — INSULIN GLARGINE 100 UNIT/ML ~~LOC~~ SOLN
100.0000 [IU] | Freq: Every day | SUBCUTANEOUS | Status: DC
  Administered 2015-02-05: 100 [IU] via SUBCUTANEOUS
  Filled 2015-02-05: qty 1

## 2015-02-05 MED ORDER — VANCOMYCIN HCL IN DEXTROSE 1-5 GM/200ML-% IV SOLN
1000.0000 mg | INTRAVENOUS | Status: DC
  Administered 2015-02-05: 1000 mg via INTRAVENOUS
  Filled 2015-02-05: qty 200

## 2015-02-05 MED ORDER — ENOXAPARIN SODIUM 40 MG/0.4ML ~~LOC~~ SOLN
40.0000 mg | SUBCUTANEOUS | Status: DC
  Administered 2015-02-05: 40 mg via SUBCUTANEOUS
  Filled 2015-02-05: qty 0.4

## 2015-02-05 MED ORDER — VANCOMYCIN HCL 10 G IV SOLR
1250.0000 mg | Freq: Once | INTRAVENOUS | Status: DC
  Filled 2015-02-05: qty 1250

## 2015-02-05 MED ORDER — TRAMADOL HCL 50 MG PO TABS: 50.0000 mg | ORAL_TABLET | Freq: Four times a day (QID) | ORAL | Status: DC | PRN

## 2015-02-05 MED ORDER — VANCOMYCIN HCL 10 G IV SOLR: 1250.0000 mg | Freq: Three times a day (TID) | INTRAVENOUS | Status: DC

## 2015-02-05 MED ORDER — CEFTAZIDIME 2 G IJ SOLR
2.0000 g | Freq: Two times a day (BID) | INTRAMUSCULAR | Status: DC
  Filled 2015-02-05: qty 2

## 2015-02-05 NOTE — Progress Notes (Signed)
DILAND, NOVOSAD ([id number]) Visit Report for 02/04/2015 Arrival Information Details Patient Name: [name], [name]. Date of Service: 02/04/2015 1:30 PM Medical Record Number: [id number] Patient Account Number: [id number] Date of Birth/Sex: 1965/01/31 (50 y.o. Male) Treating RN: Primary Care Physician: [name] Other Clinician: [name] Referring Physician: [name] Treating Physician/Extender: Frann Rider in Treatment: 4 Visit Information History Since Last Visit Added or deleted any medications: No Patient Arrived: Ambulatory Any new allergies or adverse reactions: No Arrival Time: 13:05 Had a fall or experienced change in No Accompanied By: self activities of daily living that may affect Transfer Assistance: None risk of falls: Patient Identification Verified: Yes Signs or symptoms of abuse/neglect since last No Secondary Verification Process Yes visito Completed: Hospitalized since last visit: No Patient Requires Transmission-Based No Has Dressing in Place as Prescribed: Yes Precautions: Pain Present Now: No Patient Has Alerts: Yes Electronic Signature(s) Signed: 02/04/2015 4:13:01 PM By: Lorine Bears RCP, RRT, CHT Entered By: Lorine Bears on 02/04/2015 13:15:27 [name] ([id number]) -------------------------------------------------------------------------------- Encounter Discharge Information Details Patient Name: [name] Date of Service: 02/04/2015 1:30 PM Medical Record Number: [id number] Patient Account Number: [id number] Date of Birth/Sex: 03/25/1965 (50 y.o. Male) Treating RN: Primary Care Physician: [name] Other Clinician: [name] Referring Physician: [name] Treating Physician/Extender: Frann Rider in Treatment: 4 Encounter Discharge Information Items Discharge Pain Level: 0 Discharge Condition: Stable Ambulatory Status:  Ambulatory Admitted to Discharge Destination: Hospital Transportation: Private Auto Accompanied By: self Schedule Follow-up Appointment: No Medication Reconciliation completed and provided to Patient/Care No Odalis Jordan: Clinical Summary of Care: Notes Patient is being admitted to the hospital for cellulitis and insertion of a PICC line. Electronic Signature(s) Signed: 02/04/2015 4:13:01 PM By: Lorine Bears RCP, RRT, CHT Entered By: Lorine Bears on 02/04/2015 15:44:09 [name] ([id number]) -------------------------------------------------------------------------------- Vitals Details Patient Name: [name] Date of Service: 02/04/2015 1:30 PM Medical Record Number: [id number] Patient Account Number: [id number] Date of Birth/Sex: 03-24-1965 (50 y.o. Male) Treating RN: Primary Care Physician: [name] Other Clinician: [name] Referring Physician: [name] Treating Physician/Extender: Frann Rider in Treatment: 4 Vital Signs Time Taken: 12:38 Temperature (F): 98.0 Height (in): 74 Pulse (bpm): 90 Weight (lbs): 288 Respiratory Rate (breaths/min): 18 Body Mass Index (BMI): 37 Blood Pressure (mmHg): 130/68 Capillary Blood Glucose (mg/dl): 149 Reference Range: 80 - 120 mg / dl Electronic Signature(s) Signed: 02/04/2015 4:13:01 PM By: Lorine Bears RCP, RRT, CHT Entered By: Lorine Bears on 02/04/2015 13:16:02

## 2015-02-05 NOTE — Care Management (Signed)
[employer] referral sent for IV infusion only (if needed). He goes daily to [facility] [phone] for treatment and every Friday for wound assessments. WOC can manage dressings.

## 2015-02-05 NOTE — Care Management Note (Addendum)
Case Management Note  Patient Details  Name: [name] MRN: [id number] Date of Birth: 14-Jan-1965  Subjective/Objective:                  Met with patient and his wife to discuss potential home IV infusion. They agree to [facility] through [place]. He is currently unable to bear weight on foot due to pain. He has crutches at home. He plans to return home at discharge and wife/patient agrees to learning "how" to do IV infusions. Start of cares cannot be done after 4PM so please that that into account prior to patient discharge. PICC has been placed.  Action/Plan: Referral called to [facility] with [facility] for IV infusion nurse,lab  and ABX.   Expected Discharge Date:                  Expected Discharge Plan:     In-House Referral:     Discharge planning Services  CM Consult  Post Acute Care Choice:  Durable Medical Equipment Choice offered to:  Patient, Spouse  DME Arranged:    DME Agency:  [place]:    [place] Agency:     Status of Service:  In process, will continue to follow  Medicare Important Message Given:    Date Medicare IM Given:    Medicare IM give by:    Date Additional Medicare IM Given:    Additional Medicare Important Message give by:     If discussed at [place] of Stay Meetings, dates discussed:    Additional Comments: IV Antibiotics cost shared with patient and he is not concerned with cost however, patient does not want HHPT.   Marshell Garfinkel, RN 02/05/2015, 1:24 PM

## 2015-02-05 NOTE — Progress Notes (Signed)
Completed discharge instructions with patient and spouse.  Discharged home with PICC and home health nursing to arrive this evening to educate and start IV antibiotics

## 2015-02-05 NOTE — Discharge Instructions (Signed)
Discharge antibiotics Ceftazidime 2 gm q 12 hrs Oral Levofloxacin 750 q 24 hours Oral fluconazole 200 mg qd   PICC Care per protocol Labs weekly while on IV antibiotics  CBC w diff  Comprehensive met panel CRP  Planned duration of antibiotics - approx 4 weeks

## 2015-02-05 NOTE — Progress Notes (Signed)
Pt having much difficulty maneuvering to roll over for toileting. md orders foley catheter

## 2015-02-05 NOTE — Progress Notes (Signed)
Pharmacy Antibiotic Note  [name] is a 50 y.o. male admitted on 02/04/2015 with osteomyelitis.  Pharmacy has been consulted for Vancomycin dosing.  Plan: Patient received Vancomycin 1 gm IV once ~1200.  Will order Vancomycin 1250 mg IV q8h for goal trough of 15-20 mcg/mL.  Ke: 0.081, t1/2: 8.6 h, VD: 53 L  Height: 6\' 2"  (188 cm) Weight: 287 lb 4.2 oz (130.3 kg) IBW/kg (Calculated) : 82.2  Temp (24hrs), Avg:99.4 F (37.4 C), Min:99 F (37.2 C), Max:100.1 F (37.8 C)   Recent Labs Lab 02/04/15 2000 02/05/15 0432  WBC 15.7* 16.6*  CREATININE 1.03 0.89    Estimated Creatinine Clearance: 144 mL/min (by C-G formula based on Cr of 0.89).    No Known Allergies  Antimicrobials this admission: Anti-infectives    Start     Dose/Rate Route Frequency Ordered Stop   02/06/15 0600  vancomycin (VANCOCIN) 1,250 mg in sodium chloride 0.9 % 250 mL IVPB     1,250 mg 166.7 mL/hr over 90 Minutes Intravenous Every 8 hours 02/05/15 1519     02/05/15 2200  vancomycin (VANCOCIN) 1,250 mg in sodium chloride 0.9 % 250 mL IVPB     1,250 mg 166.7 mL/hr over 90 Minutes Intravenous  Once 02/05/15 1513     02/05/15 1700  cefTAZidime (FORTAZ) injection 2 g  Status:  Discontinued     2 g Intramuscular Every 12 hours 02/05/15 1449 02/05/15 1505   02/05/15 1530  vancomycin (VANCOCIN) 1,250 mg in sodium chloride 0.9 % 250 mL IVPB  Status:  Discontinued     1,250 mg 166.7 mL/hr over 90 Minutes Intravenous  Once 02/05/15 1512 02/05/15 1556   02/05/15 1515  cefTAZidime (FORTAZ) 2 g in dextrose 5 % 50 mL IVPB     2 g 100 mL/hr over 30 Minutes Intravenous Every 12 hours 02/05/15 1506     02/05/15 1230  levofloxacin (LEVAQUIN) IVPB 750 mg     750 mg 100 mL/hr over 90 Minutes Intravenous Every 24 hours 02/05/15 1130     02/05/15 1130  vancomycin (VANCOCIN) IVPB 1000 mg/200 mL premix  Status:  Discontinued     1,000 mg 200 mL/hr over 60 Minutes Intravenous Every 24 hours 02/05/15 1129 02/05/15 1449      Dose adjustments this admission: Vancomycin 1250 mg IV q8h.  Will order trough prior to dose on 2/4 at 2130.  Microbiology results: Results for orders placed or performed during the hospital encounter of 02/04/15  MRSA PCR Screening     Status: None   Collection Time: 02/05/15 12:12 PM  Result Value Ref Range Status   MRSA by PCR NEGATIVE NEGATIVE Final    Comment:        The GeneXpert MRSA Assay (FDA approved for NASAL specimens only), is one component of a comprehensive MRSA colonization surveillance program. It is not intended to diagnose MRSA infection nor to guide or monitor treatment for MRSA infections.     Thank you for allowing pharmacy to be a part of this patient's care.  Kaito Schulenburg G 02/05/2015 4:00 PM

## 2015-02-05 NOTE — Discharge Summary (Signed)
[name] NAME: [name]    MR#:  [id number]  DATE OF BIRTH:  10/16/1965  DATE OF ADMISSION:  02/04/2015 ADMITTING PHYSICIAN: Nicholes Mango, MD  DATE OF DISCHARGE: No discharge date for patient encounter.  PRIMARY CARE PHYSICIAN: Tivis Ringer, MD    ADMISSION DIAGNOSIS:  rt foot wound infection  DISCHARGE DIAGNOSIS:  Active Problems:   Foot osteomyelitis, right ([place])   SECONDARY DIAGNOSIS:   Past Medical History  Diagnosis Date  . Diabetes mellitus without complication ([name])   . Hypertension     HOSPITAL COURSE:  [name]  is a 50 y.o. male admitted 02/04/2015 with chief complaint right foot pain. He was sent to the hospital by Dr. wound clinic for continued care of right foot osteomyelitis. While in the hospital a PICC line was inserted, he is evaluated by infectious disease who placed him on IV antibiotics to be continued for a total duration of 4 weeks  DISCHARGE CONDITIONS:   Stable/improved  CONSULTS OBTAINED:  Treatment Team:  Adrian Prows, MD  DRUG ALLERGIES:  No Known Allergies  DISCHARGE MEDICATIONS:  Discharge antibiotics Ceftazidime 2 gm q 12 hrs Oral Levofloxacin 750 q 24 hours Oral fluconazole 200 mg qd   PICC Care per protocol Labs weekly while on IV antibiotics  CBC w diff  Comprehensive met panel CRP  Planned duration of antibiotics - approx 4 weeks    Current Discharge Medication List    START taking these medications   Details  oxyCODONE-acetaminophen (ROXICET) 5-325 MG tablet Take 1 tablet by mouth every 6 (six) hours as needed for severe pain. Qty: 30 tablet, Refills: 0      CONTINUE these medications which have CHANGED   Details  traMADol (ULTRAM) 50 MG tablet Take 1 tablet (50 mg total) by mouth every 6 (six) hours as needed for moderate pain. Qty: 30 tablet, Refills: 0      CONTINUE these medications which have NOT CHANGED    Details  aspirin EC 81 MG tablet Take 81 mg by mouth daily.    glimepiride (AMARYL) 2 MG tablet Take 2 mg by mouth 2 (two) times daily.    INVOKANA 300 MG TABS tablet Take 300 mg by mouth daily.    metFORMIN (GLUCOPHAGE) 1000 MG tablet Take 1,000 mg by mouth 2 (two) times daily.    methocarbamol (ROBAXIN) 500 MG tablet Take 1 tablet (500 mg total) by mouth 2 (two) times daily. Qty: 20 tablet, Refills: 0    Olmesartan-Amlodipine-HCTZ 40-10-25 MG TABS Take 1 tablet by mouth daily. Reported on 02/04/2015    TRESIBA FLEXTOUCH 200 UNIT/ML SOPN Inject 100 Units into the skin daily.      STOP taking these medications     amoxicillin-clavulanate (AUGMENTIN) 875-125 MG tablet      doxycycline (VIBRAMYCIN) 100 MG capsule      naproxen (NAPROSYN) 500 MG tablet          DISCHARGE INSTRUCTIONS:    DIET:  Diabetic diet  DISCHARGE CONDITION:  Stable  ACTIVITY:  Activity as tolerated  OXYGEN:  Home Oxygen: No.   Oxygen Delivery: room air  DISCHARGE LOCATION:  home   If you experience worsening of your admission symptoms, develop shortness of breath, life threatening emergency, suicidal or homicidal thoughts you must seek medical attention immediately by calling 911 or calling your MD immediately  if symptoms less severe.  You Must read complete instructions/literature along with all the possible adverse reactions/side  effects for all the Medicines you take and that have been prescribed to you. Take any new Medicines after you have completely understood and accpet all the possible adverse reactions/side effects.   Please note  You were cared for by a hospitalist during your hospital stay. If you have any questions about your discharge medications or the care you received while you were in the hospital after you are discharged, you can call the unit and asked to speak with the hospitalist on call if the hospitalist that took care of you is not available. Once you are discharged,  your primary care physician will handle any further medical issues. Please note that NO REFILLS for any discharge medications will be authorized once you are discharged, as it is imperative that you return to your primary care physician (or establish a relationship with a primary care physician if you do not have one) for your aftercare needs so that they can reassess your need for medications and monitor your lab values.    On the day of Discharge:   VITAL SIGNS:  Blood pressure 125/62, pulse 94, temperature 99.3 F (37.4 C), temperature source Oral, resp. rate 18, height '6\' 2"'  (1.88 m), weight 130.3 kg (287 lb 4.2 oz), SpO2 93 %.  I/O:   Intake/Output Summary (Last 24 hours) at 02/05/15 1519 Last data filed at 02/05/15 0600  Gross per 24 hour  Intake    240 ml  Output      0 ml  Net    240 ml    PHYSICAL EXAMINATION:  GENERAL:  50 y.o.-year-old patient lying in the bed with no acute distress.  EYES: Pupils equal, round, reactive to light and accommodation. No scleral icterus. Extraocular muscles intact.  HEENT: Head atraumatic, normocephalic. Oropharynx and nasopharynx clear.  NECK:  Supple, no jugular venous distention. No thyroid enlargement, no tenderness.  LUNGS: Normal breath sounds bilaterally, no wheezing, rales,rhonchi or crepitation. No use of accessory muscles of respiration.  CARDIOVASCULAR: S1, S2 normal. No murmurs, rubs, or gallops.  ABDOMEN: Soft, non-tender, non-distended. Bowel sounds present. No organomegaly or mass.  EXTREMITIES: No pedal edema, cyanosis, or clubbing.  NEUROLOGIC: Cranial nerves II through XII are intact. Muscle strength 5/5 in all extremities. Sensation intact. Gait not checked.  PSYCHIATRIC: The patient is alert and oriented x 3.  SKIN: No obvious rash, lesion, or ulcer.   DATA REVIEW:   CBC  Recent Labs Lab 02/05/15 0432  WBC 16.6*  HGB 13.8  HCT 40.6  PLT 225    Chemistries   Recent Labs Lab 02/05/15 0432  NA 136  K 3.9   CL 101  CO2 27  GLUCOSE 82  BUN 21*  CREATININE 0.89  CALCIUM 8.4*    Cardiac Enzymes No results for input(s): TROPONINI in the last 168 hours.  Microbiology Results  Results for orders placed or performed during the hospital encounter of 02/04/15  MRSA PCR Screening     Status: None   Collection Time: 02/05/15 12:12 PM  Result Value Ref Range Status   MRSA by PCR NEGATIVE NEGATIVE Final    Comment:        The GeneXpert MRSA Assay (FDA approved for NASAL specimens only), is one component of a comprehensive MRSA colonization surveillance program. It is not intended to diagnose MRSA infection nor to guide or monitor treatment for MRSA infections.     RADIOLOGY:  Dg Chest Port 1 View  02/05/2015  CLINICAL DATA:  Evaluate PICC line placement EXAM: PORTABLE CHEST  1 VIEW COMPARISON:  01/14/2015 FINDINGS: Right arm PICC line tip is in the projection of the SVC. The heart size is normal. No pleural effusion or edema. No airspace consolidation identified. IMPRESSION: 1. Right arm PICC line tip is in the SVC. Electronically Signed   By: Kerby Moors M.D.   On: 02/05/2015 11:58     Management plans discussed with the patient, family and they are in agreement.  CODE STATUS:     Code Status Orders        Start     Ordered   02/04/15 1846  Full code   Continuous     02/04/15 1845    Code Status History    Date Active Date Inactive Code Status Order ID Comments User Context   This patient has a current code status but no historical code status.      TOTAL TIME TAKING CARE OF THIS PATIENT: 28 minutes.    Hower,  Karenann Cai.D on 02/05/2015 at 3:19 PM  Between 7am to 6pm - Pager - [phone]  After 6pm go to [email or web address] - password [email or web address] [place] [facility]  Office  [phone]  CC: Primary care physician; Tivis Ringer, MD

## 2015-02-05 NOTE — Progress Notes (Signed)
[place] [place] NAME: [name]    MR#:  [id number]  DATE OF BIRTH:  10/29/1965  SUBJECTIVE:  Patient admitted  02/04/15 of the advice of wound care and given a right foot osteomyelitis. He has been complaining of increased right foot pain described only as pain 8/10 on movement no relieving factors radiation proximally, associated redness and warmth  REVIEW OF SYSTEMS:  CONSTITUTIONAL: No fever, fatigue or weakness.  EYES: No blurred or double vision.  EARS, NOSE, AND THROAT: No tinnitus or ear pain.  RESPIRATORY: No cough, shortness of breath, wheezing or hemoptysis.  CARDIOVASCULAR: No chest pain, orthopnea, edema.  GASTROINTESTINAL: No nausea, vomiting, diarrhea or abdominal pain.  GENITOURINARY: No dysuria, hematuria.  ENDOCRINE: No polyuria, nocturia,  HEMATOLOGY: No anemia, easy bruising or bleeding SKIN: Redness surrounding right foot with ulcer on the bottom. MUSCULOSKELETAL: No joint pain or arthritis.   NEUROLOGIC: No tingling, numbness, weakness.  PSYCHIATRY: No anxiety or depression.   DRUG ALLERGIES:  No Known Allergies  VITALS:  Blood pressure 124/63, pulse 99, temperature 99 F (37.2 C), temperature source Oral, resp. rate 18, height '6\' 2"'  (1.88 m), weight 130.3 kg (287 lb 4.2 oz), SpO2 96 %.  PHYSICAL EXAMINATION:  VITAL SIGNS: Filed Vitals:   02/05/15 0731 02/05/15 0731  BP: 124/63   Pulse: 99   Temp:  99 F (37.2 C)  Resp: 18    GENERAL:50 y.o.male currently in no acute distress.  HEAD: Normocephalic, atraumatic.  EYES: Pupils equal, round, reactive to light. Extraocular muscles intact. No scleral icterus.  MOUTH: Moist mucosal membrane. Dentition intact. No abscess noted.  EAR, NOSE, THROAT: Clear without exudates. No external lesions.  NECK: Supple. No thyromegaly. No nodules. No JVD.  PULMONARY: Clear to ascultation, without wheeze rails or rhonci. No use of accessory muscles, Good  respiratory effort. good air entry bilaterally CHEST: Nontender to palpation.  CARDIOVASCULAR: S1 and S2. Regular rate and rhythm. No murmurs, rubs, or gallops. No edema. Pedal pulses 2+ bilaterally.  GASTROINTESTINAL: Soft, nontender, nondistended. No masses. Positive bowel sounds. No hepatosplenomegaly.  MUSCULOSKELETAL: No swelling, clubbing, or edema. Range of motion full in all extremities.  NEUROLOGIC: Cranial nerves II through XII are intact. No gross focal neurological deficits. Sensation intact. Reflexes intact.  SKIN: Right foot ulceration base of fifth digit surrounding erythema, warm to palpation tenderness to touch otherwise no further ulceration, lesions, rashes, or cyanosis. Skin warm and dry. Turgor intact.  PSYCHIATRIC: Mood, affect within normal limits. The patient is awake, alert and oriented x 3. Insight, judgment intact.      LABORATORY PANEL:   CBC  Recent Labs Lab 02/05/15 0432  WBC 16.6*  HGB 13.8  HCT 40.6  PLT 225   ------------------------------------------------------------------------------------------------------------------  Chemistries   Recent Labs Lab 02/05/15 0432  NA 136  K 3.9  CL 101  CO2 27  GLUCOSE 82  BUN 21*  CREATININE 0.89  CALCIUM 8.4*   ------------------------------------------------------------------------------------------------------------------  Cardiac Enzymes No results for input(s): TROPONINI in the last 168 hours. ------------------------------------------------------------------------------------------------------------------  RADIOLOGY:  No results found.  EKG:   Orders placed or performed during the hospital encounter of 01/19/15  . EKG 12-Lead  . EKG 12-Lead  . EKG 12-Lead  . EKG 12-Lead    ASSESSMENT AND PLAN:   50 year old Caucasian gentleman history of type 2 diabetes insulin requiring as well as essential hypertension was admitted to the hospital 02/04/15 for right foot osteomyelitis  1. Right  foot osteomyelitis:  Check ESR, insert PICC line, consult infectious disease await follow-up Will initiate vancomycin/Levaquin for now and taper per infectious disease suspect at least 6 week coverage 2. Type 2 diabetes insulin requiring: Continue basal insulin at sliding coverage 3. Essential hypertension Norvasc 4. Venous thromboembolism prophylactic: Lovenox     All the records are reviewed and case discussed with Care Management/Social Workerr. Management plans discussed with the patient, family and they are in agreement.  CODE STATUS: full  TOTAL TIME TAKING CARE OF THIS PATIENT: 35 minutes.   POSSIBLE D/C IN 1-2 DAYS, DEPENDING ON CLINICAL CONDITION.   Marco Adelson,  Karenann Cai.D on 02/05/2015 at 11:31 AM  Between 7am to 6pm - Pager - [phone]  After 6pm: House Pager: - [phone]  [facility]  Office  [phone]  CC: Primary care physician; Tivis Ringer, MD

## 2015-02-05 NOTE — Progress Notes (Signed)
ALVIE, MAZZOCCHI ([id number]) Visit Report for 02/04/2015 HBO Details Patient Name: [name], [name]. Date of Service: 02/04/2015 1:30 PM Medical Record Number: [id number] Patient Account Number: [id number] Date of Birth/Sex: 1965/10/21 (50 y.o. Male) Treating RN: Primary Care Physician: [name] Other Clinician: [name] Referring Physician: [name] Treating Physician/Extender: Frann Rider in Treatment: 4 HBO Treatment Course Details Treatment Course Ordering Physician: Christin Fudge 1 Number: HBO Treatment Start Date: 02/01/2015 Total Treatments 30 Ordered: HBO Indication: Diabetic Ulcer(s) of the Lower Extremity HBO Treatment Details Treatment Number: 4 Patient Type: Outpatient Chamber Type: Monoplace Chamber Serial #: [id number] Treatment Protocol: 2.0 ATA with 90 minutes oxygen, and no air breaks Treatment Details Compression Rate Down: 1.5 psi / minute De-Compression Rate Up: 1.5 psi / minute Air breaks and breathing Compress Tx Pressure periods Decompress Decompress Begins Reached (leave unused spaces Begins Ends blank) Chamber Pressure (ATA) 1 2 - - - - - - 2 1 Clock Time (24 hr) 13:13 13:25 - - - - - - 14:55 15:06 Treatment Length: 113 (minutes) Treatment Segments: 4 Capillary Blood Glucose Pre Capillary Blood Glucose (mg/dl): Post Capillary Blood Glucose (mg/dl): Vital Signs Capillary Blood Glucose Reference Range: 80 - 120 mg / dl HBO Diabetic Blood Glucose Intervention Range: <131 mg/dl or >249 mg/dl Time Vitals Blood Respiratory Capillary Blood Glucose Pulse Action Type: Pulse: Temperature: Taken: Pressure: Rate: Glucose (mg/dl): Meter #: Oximetry (%) Taken: Pre 12:38 130/68 90 18 98 149 1 none Post 15:09 140/68 90 18 98.5 165 1 none Treatment Response Treatment Completion Status: Treatment Completed without Adverse Event YER, BERENDES ([id number]) Electronic Signature(s) Signed: 02/04/2015 3:59:20 PM By:  Christin Fudge MD, FACS Signed: 02/04/2015 4:13:01 PM By: Lorine Bears RCP, RRT, CHT Previous Signature: 02/04/2015 3:25:35 PM Version By: Christin Fudge MD, FACS Entered By: Lorine Bears on 02/04/2015 15:43:06 [name] -------------------------------------------------------------------------------- HBO Safety Checklist Details Patient Name: [name], [name]. Date of Service: 02/04/2015 1:30 PM Medical Record Number: [id number] Patient Account Number: [id number] Date of Birth/Sex: January 04, 1965 (50 y.o. Male) Treating RN: Primary Care Physician: [name] Other Clinician: [name] Referring Physician: [name] Treating Physician/Extender: Frann Rider in Treatment: 4 HBO Safety Checklist Items Safety Checklist Consent Form Signed Patient voided / foley secured and emptied 11:00 am (drank Ensure at When did you last eato 12:35 Last dose of injectable or oral agent 11:00 am NA Ostomy pouch emptied and vented if applicable NA All implantable devices assessed, documented and approved NA Intravenous access site secured and place Valuables secured Linens and cotton and cotton/polyester blend (less than 51% polyester) Personal oil-based products / skin lotions / body lotions removed Wigs or hairpieces removed Smoking or tobacco materials removed Books / newspapers / magazines / loose paper removed Cologne, aftershave, perfume and deodorant removed Jewelry removed (may wrap wedding band) Make-up removed Hair care products removed Battery operated devices (external) removed Heating patches and chemical warmers removed NA Titanium eyewear removed NA Nail polish cured greater than 10 hours NA Casting material cured greater than 10 hours NA Hearing aids removed NA Loose dentures or partials removed NA Prosthetics have been removed Patient demonstrates correct use of air break device (if  applicable) Patient concerns have been addressed Patient grounding bracelet on and cord attached to chamber Specifics for Inpatients (complete in addition to above) Medication sheet sent with patient Intravenous medications needed or due during therapy sent with patient RAFORD, BAZINET ([id number]) Drainage tubes (e.g. nasogastric tube or chest tube secured and  vented) Endotracheal or Tracheotomy tube secured Cuff deflated of air and inflated with saline Airway suctioned Electronic Signature(s) Signed: 02/04/2015 4:13:01 PM By: Lorine Bears RCP, RRT, CHT Entered By: Lorine Bears on 02/04/2015 13:17:51

## 2015-02-05 NOTE — Progress Notes (Signed)
Infectious Disease Long Term IV Antibiotic Orders  Diagnosis: Diabetic foot infection and early osteomyeltisi  Culture results Panteo, candida  Allergies: No Known Allergies  Discharge antibiotics Ceftazidime 2 gm q 12 hrs Oral Levofloxacin 750 q 24 hours Oral fluconazole 200 mg qd   PICC Care per protocol Labs weekly while on IV antibiotics      CBC w diff   Comprehensive met panel CRP   Planned duration of antibiotics - approx  4 weeks  Stop date Feb 27th    Follow up clinic date TBD  FAX weekly labs to [phone]  Leonel Ramsay, MD

## 2015-02-05 NOTE — Consult Note (Addendum)
[facility] Infectious Disease     Reason for Consult: Diabetic foot infection    Referring Physician: Hower, David  Date of Admission:  02/04/2015   Active Problems:   Foot osteomyelitis, right (HCC)   HPI: [name] is a 50 y.o. male known history of hypertension and diabetes. The patient got the right foot I&D 2-3 weeks ago in wound care center. But the right foot infection is getting worse, so he was sent for direct admission by wound. His WBC is elevated, ESR pending. Culture pending. Had MRI last week   Past Medical History  Diagnosis Date  . Diabetes mellitus without complication (HCC)   . Hypertension    History reviewed. No pertinent past surgical history. Social History  Substance Use Topics  . Smoking status: Never Smoker   . Smokeless tobacco: None  . Alcohol Use: No   History reviewed. No pertinent family history.  Allergies: No Known Allergies  Current antibiotics: Antibiotics Given (last 72 hours)    Date/Time Action Medication Dose Rate   02/05/15 1223 Given   vancomycin (VANCOCIN) IVPB 1000 mg/200 mL premix 1,000 mg 200 mL/hr   02/05/15 1335 Given   levofloxacin (LEVAQUIN) IVPB 750 mg 750 mg 100 mL/hr      MEDICATIONS: . irbesartan  300 mg Oral Daily   And  . amLODipine  10 mg Oral Daily   And  . hydrochlorothiazide  25 mg Oral Daily  . aspirin EC  81 mg Oral Daily  . enoxaparin (LOVENOX) injection  40 mg Subcutaneous Q24H  . insulin aspart  0-5 Units Subcutaneous QHS  . insulin aspart  0-9 Units Subcutaneous TID WC  . insulin glargine  100 Units Subcutaneous QHS  . levofloxacin (LEVAQUIN) IV  750 mg Intravenous Q24H  . methocarbamol  500 mg Oral BID  . sodium chloride flush  3 mL Intravenous Q12H  . vancomycin  1,000 mg Intravenous Q24H    Review of Systems - 11 systems reviewed and negative per HPI   OBJECTIVE: Temp:  [99 F (37.2 C)-100.1 F (37.8 C)] 99 F (37.2 C) (02/03 0731) Pulse Rate:  [90-99] 99 (02/03  0731) Resp:  [18] 18 (02/03 0731) BP: (124-138)/(55-68) 124/63 mmHg (02/03 0731) SpO2:  [96 %-99 %] 96 % (02/03 0731) Weight:  [130.3 kg (287 lb 4.2 oz)] 130.3 kg (287 lb 4.2 oz) (02/02 2048) Physical Exam  Constitutional: He is oriented to person, place, and time. He appears well-developed and well-nourished. No distress.  HENT:  Mouth/Throat: Oropharynx is clear and moist. No oropharyngeal exudate.  Cardiovascular: Normal rate, regular rhythm and normal heart sounds. Exam reveals no gallop and no friction rub.  No murmur heard.  Pulmonary/Chest: Effort normal and breath sounds normal. No respiratory distress. He has no wheezes.  Abdominal: Soft. Bowel sounds are normal. He exhibits no distension. There is no tenderness.  Lymphadenopathy:  He has no cervical adenopathy.  Picc RUE Neurological: He is alert and oriented to person, place, and time.  Skin: R 5th MT head area with open wound with some drainage. Mild surrounding redness Psychiatric: He has a normal mood and affect. His behavior is normal.     LABS: Results for orders placed or performed during the hospital encounter of 02/04/15 (from the past 48 hour(s))  Basic metabolic panel     Status: Abnormal   Collection Time: 02/04/15  8:00 PM  Result Value Ref Range   Sodium 135 135 - 145 mmol/L   Potassium 4.4 3.5 - 5.1   mmol/L   Chloride 100 (L) 101 - 111 mmol/L   CO2 27 22 - 32 mmol/L   Glucose, Bld 165 (H) 65 - 99 mg/dL   BUN 22 (H) 6 - 20 mg/dL   Creatinine, Ser 1.03 0.61 - 1.24 mg/dL   Calcium 8.6 (L) 8.9 - 10.3 mg/dL   GFR calc non Af Amer >60 >60 mL/min   GFR calc Af Amer >60 >60 mL/min    Comment: (NOTE) The eGFR has been calculated using the CKD EPI equation. This calculation has not been validated in all clinical situations. eGFR's persistently <60 mL/min signify possible Chronic Kidney Disease.    Anion gap 8 5 - 15  CBC     Status: Abnormal   Collection Time: 02/04/15  8:00 PM  Result Value Ref Range   WBC  15.7 (H) 3.8 - 10.6 K/uL   RBC 4.80 4.40 - 5.90 MIL/uL   Hemoglobin 14.0 13.0 - 18.0 g/dL   HCT 41.7 40.0 - 52.0 %   MCV 86.8 80.0 - 100.0 fL   MCH 29.1 26.0 - 34.0 pg   MCHC 33.5 32.0 - 36.0 g/dL   RDW 13.7 11.5 - 14.5 %   Platelets 224 150 - 440 K/uL  Glucose, capillary     Status: Abnormal   Collection Time: 02/04/15  9:58 PM  Result Value Ref Range   Glucose-Capillary 160 (H) 65 - 99 mg/dL   Comment 1 Notify RN   Basic metabolic panel     Status: Abnormal   Collection Time: 02/05/15  4:32 AM  Result Value Ref Range   Sodium 136 135 - 145 mmol/L   Potassium 3.9 3.5 - 5.1 mmol/L   Chloride 101 101 - 111 mmol/L   CO2 27 22 - 32 mmol/L   Glucose, Bld 82 65 - 99 mg/dL   BUN 21 (H) 6 - 20 mg/dL   Creatinine, Ser 0.89 0.61 - 1.24 mg/dL   Calcium 8.4 (L) 8.9 - 10.3 mg/dL   GFR calc non Af Amer >60 >60 mL/min   GFR calc Af Amer >60 >60 mL/min    Comment: (NOTE) The eGFR has been calculated using the CKD EPI equation. This calculation has not been validated in all clinical situations. eGFR's persistently <60 mL/min signify possible Chronic Kidney Disease.    Anion gap 8 5 - 15  CBC     Status: Abnormal   Collection Time: 02/05/15  4:32 AM  Result Value Ref Range   WBC 16.6 (H) 3.8 - 10.6 K/uL   RBC 4.76 4.40 - 5.90 MIL/uL   Hemoglobin 13.8 13.0 - 18.0 g/dL   HCT 40.6 40.0 - 52.0 %   MCV 85.3 80.0 - 100.0 fL   MCH 28.9 26.0 - 34.0 pg   MCHC 33.9 32.0 - 36.0 g/dL   RDW 13.6 11.5 - 14.5 %   Platelets 225 150 - 440 K/uL  Sedimentation rate     Status: Abnormal   Collection Time: 02/05/15  4:32 AM  Result Value Ref Range   Sed Rate 60 (H) 0 - 15 mm/hr  Glucose, capillary     Status: Abnormal   Collection Time: 02/05/15 11:41 AM  Result Value Ref Range   Glucose-Capillary 130 (H) 65 - 99 mg/dL  MRSA PCR Screening     Status: None   Collection Time: 02/05/15 12:12 PM  Result Value Ref Range   MRSA by PCR NEGATIVE NEGATIVE    Comment:        The   GeneXpert MRSA Assay  (FDA approved for NASAL specimens only), is one component of a comprehensive MRSA colonization surveillance program. It is not intended to diagnose MRSA infection nor to guide or monitor treatment for MRSA infections.    No components found for: ESR, C REACTIVE PROTEIN MICRO: Recent Results (from the past 720 hour(s))  Wound culture     Status: None   Collection Time: 01/15/15 10:50 AM  Result Value Ref Range Status   Specimen Description FOOT  Final   Special Requests NONE  Final   Gram Stain FEW WBC SEEN RARE YEAST RARE GRAM VARIABLE ROD   Final   Culture   Final    RARE GROWTH PANTOEA SPECIES MODERATE GROWTH CANDIDA PARAPSILOSIS    Report Status 01/20/2015 FINAL  Final   Organism ID, Bacteria PANTOEA SPECIES  Final      Susceptibility   Pantoea species - MIC (ETEST)*    GENTAMICIN Value in next row Sensitive      SENSITIVE0.38    LEVOFLOXACIN Value in next row Sensitive      SENSITIVE0.25    TICAR/CLAVULANIC Value in next row Intermediate      INTERMEDIATE96    * RARE GROWTH PANTOEA SPECIES  MRSA PCR Screening     Status: None   Collection Time: 02/05/15 12:12 PM  Result Value Ref Range Status   MRSA by PCR NEGATIVE NEGATIVE Final    Comment:        The GeneXpert MRSA Assay (FDA approved for NASAL specimens only), is one component of a comprehensive MRSA colonization surveillance program. It is not intended to diagnose MRSA infection nor to guide or monitor treatment for MRSA infections.     IMAGING: Dg Chest 2 View  01/14/2015  CLINICAL DATA:  Clearance for hyperbaric oxygen therapy, hypertension, type II diabetes mellitus EXAM: CHEST  2 VIEW COMPARISON:  None FINDINGS: Normal heart size, mediastinal contours, and pulmonary vascularity. Minimal central peribronchial thickening. Lungs otherwise clear. No pleural effusion or pneumothorax. Bones unremarkable. IMPRESSION: Minimal bronchitic changes without infiltrate. Electronically Signed   By: Lavonia Dana  M.D.   On: 01/14/2015 15:47   Mr Foot Right Wo Contrast  01/28/2015  CLINICAL DATA:  Chronic ulcer of the right foot.  History diabetes. EXAM: MRI OF THE RIGHT FOREFOOT WITHOUT CONTRAST TECHNIQUE: Multiplanar, multisequence MR imaging was performed. No intravenous contrast was administered. COMPARISON:  None. FINDINGS: There is a soft tissue ulcer overlying the plantar aspect of the fifth metatarsal head extending down to the cortex. There is mild surrounding soft tissue edema. There is no fluid collection or hematoma. There is no definite cortical destruction. There is subcortical marrow edema in the fifth metatarsal head with corresponding focal T1 hypointensity involving the plantar lateral aspect of the fifth metatarsal head. There is no joint effusion of the fifth MTP joint. There is no other marrow signal abnormality. There is mild osteoarthritis of the first MTP joint. There is no joint effusion. There is no periosteal reaction. The visualized flexor, extensor and peroneal tendons are intact. There is generalized soft tissue edema in the subcutaneous fat along the dorsal lateral aspect of the midfoot. IMPRESSION: 1. Soft tissue ulcer overlying the plantar aspect of the fifth metatarsal head extending to the cortex. Subcortical marrow edema in the fifth metatarsal head with corresponding T1 hypointensity is concerning for early osteomyelitis of the plantar lateral aspect of the fifth metatarsal head. Electronically Signed   By: Kathreen Devoid   On: 01/28/2015 08:14   Dg  Chest Port 1 View  02/05/2015  CLINICAL DATA:  Evaluate PICC line placement EXAM: PORTABLE CHEST 1 VIEW COMPARISON:  01/14/2015 FINDINGS: Right arm PICC line tip is in the projection of the SVC. The heart size is normal. No pleural effusion or edema. No airspace consolidation identified. IMPRESSION: 1. Right arm PICC line tip is in the SVC. Electronically Signed   By: Kerby Moors M.D.   On: 02/05/2015 11:58    Assessment:   [name] is a 50 y.o. male with DM, PN admitted with R 5th MT head ulcer. MRI done a week ago showed possible early osteo. Following at wound care and getting HBO2.  Recommendations Will plan on 4 week course of IV ceftazidime 2 gm q 12 and oral levo 750 q 24 and diflucan 200 qd for first 2 weeks Abx sheet filled out.  Will need fu closely with wound care. If worsens will need to see podiatry, repeat MRI and possible surgical resection. Thank you very much for allowing me to participate in the care of this patient. Please call with questions.   Cheral Marker. [name], MD

## 2015-02-06 NOTE — Progress Notes (Signed)
[place], [place] ([id number]) Visit Report for 02/04/2015 Arrival Information Details Patient Name: [name], [name]. Date of Service: 02/04/2015 12:45 PM Medical Record Number: [id number] Patient Account Number: [id number] Date of Birth/Sex: 07-24-1965 (50 y.o. Male) Treating RN: Montey Hora Primary Care Physician: [name] Other Clinician: Referring Physician: [name] Treating Physician/Extender: Frann Rider in Treatment: 4 Visit Information History Since Last Visit Added or deleted any medications: No Patient Arrived: Ambulatory Any new allergies or adverse reactions: No Arrival Time: 12:42 Had a fall or experienced change in No Accompanied By: self activities of daily living that may affect Transfer Assistance: None risk of falls: Patient Identification Verified: Yes Signs or symptoms of abuse/neglect since last No Secondary Verification Process Yes visito Completed: Hospitalized since last visit: No Patient Requires Transmission-Based No Pain Present Now: Yes Precautions: Patient Has Alerts: Yes Electronic Signature(s) Signed: 02/04/2015 5:14:04 PM By: Montey Hora Entered By: Montey Hora on 02/04/2015 12:43:10 [name] ([id number]) -------------------------------------------------------------------------------- Clinic Level of Care Assessment Details Patient Name: [name] Date of Service: 02/04/2015 12:45 PM Medical Record Number: [id number] Patient Account Number: [id number] Date of Birth/Sex: August 30, 1965 (50 y.o. Male) Treating RN: Montey Hora Primary Care Physician: [name] Other Clinician: Referring Physician: [name] Treating Physician/Extender: Frann Rider in Treatment: 4 Clinic Level of Care Assessment Items TOOL 4 Quantity Score []  - Use when only an EandM is performed on FOLLOW-UP visit 0 ASSESSMENTS - Nursing Assessment / Reassessment X - Reassessment of  Co-morbidities (includes updates in patient status) 1 10 X - Reassessment of Adherence to Treatment Plan 1 5 ASSESSMENTS - Wound and Skin Assessment / Reassessment []  - Simple Wound Assessment / Reassessment - one wound 0 []  - Complex Wound Assessment / Reassessment - multiple wounds 0 []  - Dermatologic / Skin Assessment (not related to wound area) 0 ASSESSMENTS - Focused Assessment []  - Circumferential Edema Measurements - multi extremities 0 []  - Nutritional Assessment / Counseling / Intervention 0 X - Lower Extremity Assessment (monofilament, tuning fork, pulses) 1 5 []  - Peripheral Arterial Disease Assessment (using hand held doppler) 0 ASSESSMENTS - Ostomy and/or Continence Assessment and Care []  - Incontinence Assessment and Management 0 []  - Ostomy Care Assessment and Management (repouching, etc.) 0 PROCESS - Coordination of Care X - Simple Patient / Family Education for ongoing care 1 15 []  - Complex (extensive) Patient / Family Education for ongoing care 0 []  - Staff obtains Programmer, systems, Records, Test Results / Process Orders 0 []  - Staff telephones HHA, Nursing Homes / Clarify orders / etc 0 []  - Routine Transfer to another Facility (non-emergent condition) 0 [name], [name] ([id number]) []  - Routine Hospital Admission (non-emergent condition) 0 []  - New Admissions / Biomedical engineer / Ordering NPWT, Apligraf, etc. 0 []  - Emergency Hospital Admission (emergent condition) 0 X - Simple Discharge Coordination 1 10 []  - Complex (extensive) Discharge Coordination 0 PROCESS - Special Needs []  - Pediatric / Minor Patient Management 0 []  - Isolation Patient Management 0 []  - Hearing / Language / Visual special needs 0 []  - Assessment of Community assistance (transportation, D/C planning, etc.) 0 []  - Additional assistance / Altered mentation 0 []  - Support Surface(s) Assessment (bed, cushion, seat, etc.) 0 INTERVENTIONS - Wound Cleansing / Measurement X - Simple Wound  Cleansing - one wound 1 5 []  - Complex Wound Cleansing - multiple wounds 0 X - Wound Imaging (photographs - any number of wounds) 1 5 []  - Wound Tracing (instead of photographs) 0 X -  Simple Wound Measurement - one wound 1 5 []  - Complex Wound Measurement - multiple wounds 0 INTERVENTIONS - Wound Dressings X - Small Wound Dressing one or multiple wounds 1 10 []  - Medium Wound Dressing one or multiple wounds 0 []  - Large Wound Dressing one or multiple wounds 0 []  - Application of Medications - topical 0 []  - Application of Medications - injection 0 INTERVENTIONS - Miscellaneous []  - External ear exam 0 XABIAN, [name] ([id number]) []  - Specimen Collection (cultures, biopsies, blood, body fluids, etc.) 0 []  - Specimen(s) / Culture(s) sent or taken to Lab for analysis 0 []  - Patient Transfer (multiple staff / Harrel Lemon Lift / Similar devices) 0 []  - Simple Staple / Suture removal (25 or less) 0 []  - Complex Staple / Suture removal (26 or more) 0 []  - Hypo / Hyperglycemic Management (close monitor of Blood Glucose) 0 []  - Ankle / Brachial Index (ABI) - do not check if billed separately 0 X - Vital Signs 1 5 Has the patient been seen at the hospital within the last three years: Yes Total Score: 75 Level Of Care: New/Established - Level 2 Electronic Signature(s) Signed: 02/04/2015 1:20:58 PM By: Montey Hora Entered By: Montey Hora on 02/04/2015 13:20:56 [name] ([id number]) -------------------------------------------------------------------------------- Encounter Discharge Information Details Patient Name: [name] Date of Service: 02/04/2015 12:45 PM Medical Record Number: [id number] Patient Account Number: [id number] Date of Birth/Sex: Feb 25, 1965 (50 y.o. Male) Treating RN: Montey Hora Primary Care Physician: [name] Other Clinician: Referring Physician: [name] Treating Physician/Extender: Frann Rider in Treatment:  4 Encounter Discharge Information Items Discharge Pain Level: 0 Discharge Condition: Stable Ambulatory Status: Ambulatory Discharge Destination: Home Private Transportation: Auto Accompanied By: self Schedule Follow-up Appointment: Yes Medication Reconciliation completed and No provided to Patient/Care Shalina Norfolk: Clinical Summary of Care: Electronic Signature(s) Signed: 02/04/2015 1:22:16 PM By: Montey Hora Entered By: Montey Hora on 02/04/2015 13:22:16 [name] ([id number]) -------------------------------------------------------------------------------- Lower Extremity Assessment Details Patient Name: [name] Date of Service: 02/04/2015 12:45 PM Medical Record Number: [id number] Patient Account Number: [id number] Date of Birth/Sex: 14-May-1965 (50 y.o. Male) Treating RN: Montey Hora Primary Care Physician: [name] Other Clinician: Referring Physician: [name] Treating Physician/Extender: Frann Rider in Treatment: 4 Edema Assessment Assessed: [Left: No] [Right: No] Edema: [Left: Ye] [Right: s] Vascular Assessment Pulses: Posterior Tibial Dorsalis Pedis Palpable: [Right:Yes] Extremity colors, hair growth, and conditions: Extremity Color: [Right:Red] Hair Growth on Extremity: [Right:Yes] Temperature of Extremity: [Right:Warm] Capillary Refill: [Right:< 3 seconds] Electronic Signature(s) Signed: 02/04/2015 5:14:04 PM By: Montey Hora Entered By: Montey Hora on 02/04/2015 12:48:58 [name] ([id number]) -------------------------------------------------------------------------------- Multi Wound Chart Details Patient Name: [name] Date of Service: 02/04/2015 12:45 PM Medical Record Number: [id number] Patient Account Number: [id number] Date of Birth/Sex: [birth year] (50 y.o. Male) Treating RN: Montey Hora Primary Care Physician: [name] Other Clinician: Referring Physician: [name] Treating Physician/Extender: Frann Rider in Treatment: 4 Vital Signs Height(in): 74 Capillary Blood 149 Glucose(mg/dl): Weight(lbs): 288 Pulse(bpm): 90 Body Mass Index(BMI): 37 Blood Pressure Temperature(F): 98.0 130/68 (mmHg): Respiratory Rate 18 (breaths/min): Photos: [1:No Photos] [N/A:N/A] Wound Location: [1:Right Foot - Plantar] [N/A:N/A] Wounding Event: [1:Gradually Appeared] [N/A:N/A] Primary Etiology: [1:Diabetic Wound/Ulcer of the Lower Extremity] [N/A:N/A] Comorbid History: [1:Hypertension, Type II Diabetes, Neuropathy] [N/A:N/A] Date Acquired: [1:12/30/2014] [N/A:N/A] Weeks of Treatment: [1:4] [N/A:N/A] Wound Status: [1:Open] [N/A:N/A] Measurements L x W x D 1.5x1.6x0.9 [N/A:N/A] (cm) Area (cm) : [1:1.885] [N/A:N/A] Volume (cm) : [1:1.696] [N/A:N/A] % Reduction in Area: [  1:-140.10%] [N/A:N/A] % Reduction in Volume: -618.60% [N/A:N/A] Classification: [1:Grade 3] [N/A:N/A] Wagner Verification: [1:Abscess] [N/A:N/A] Exudate Amount: [1:Large] [N/A:N/A] Exudate Type: [1:Serosanguineous] [N/A:N/A] Exudate Color: [1:red, brown] [N/A:N/A] Wound Margin: [1:Distinct, outline attached] [N/A:N/A] Granulation Amount: [1:Medium (34-66%)] [N/A:N/A] Necrotic Amount: [1:Small (1-33%)] [N/A:N/A] Exposed Structures: [1:Fascia: No Fat: No Tendon: No Muscle: No Joint: No Bone: No] [N/A:N/A] Limited to Skin Breakdown Epithelialization: None N/A N/A Periwound Skin Texture: Callus: Yes N/A N/A Edema: No Excoriation: No Induration: No Crepitus: No Fluctuance: No Friable: No Rash: No Scarring: No Periwound Skin Moist: Yes N/A N/A Moisture: Maceration: No Dry/Scaly: No Periwound Skin Color: Atrophie Blanche: No N/A N/A Cyanosis: No Ecchymosis: No Erythema: No Hemosiderin Staining: No Mottled: No Pallor: No Rubor: No Temperature: No Abnormality N/A N/A Tenderness on No N/A N/A Palpation: Wound Preparation: Ulcer Cleansing: N/A  N/A Rinsed/Irrigated with Saline Topical Anesthetic Applied: Other: Lidocaine 4% Treatment Notes Electronic Signature(s) Signed: 02/04/2015 5:14:04 PM By: Montey Hora Entered By: Montey Hora on 02/04/2015 12:55:46 [name] ([id number]) -------------------------------------------------------------------------------- [facility] Details Patient Name: [name], [name]. Date of Service: 02/04/2015 12:45 PM Medical Record Number: [id number] Patient Account Number: [id number] Date of Birth/Sex: 1965-11-04 (50 y.o. Male) Treating RN: Montey Hora Primary Care Physician: [name] Other Clinician: Referring Physician: [name] Treating Physician/Extender: Frann Rider in Treatment: 4 Active Inactive HBO Nursing Diagnoses: Anxiety related to feelings of confinement associated with the hyperbaric oxygen chamber Anxiety related to knowledge deficit of hyperbaric oxygen therapy and treatment procedures Discomfort related to temperature and humidity changes inside hyperbaric chamber Potential for barotraumas to ears, sinuses, teeth, and lungs or cerebral gas embolism related to changes in atmospheric pressure inside hyperbaric oxygen chamber Potential for oxygen toxicity seizures related to delivery of 100% oxygen at an increased atmospheric pressure Potential for pulmonary oxygen toxicity related to delivery of 100% oxygen at an increased atmospheric pressure Goals: Barotrauma will be prevented during HBO2 Date Initiated: 01/07/2015 Goal Status: Active Patient and/or family will be able to state/discuss factors appropriate to the management of their disease process during treatment Date Initiated: 01/07/2015 Goal Status: Active Patient will tolerate the hyperbaric oxygen therapy treatment Date Initiated: 01/07/2015 Goal Status: Active Patient will tolerate the internal climate of the chamber Date Initiated: 01/07/2015 Goal Status:  Active Patient/caregiver will verbalize understanding of HBO goals, rationale, procedures and potential hazards Date Initiated: 01/07/2015 Goal Status: Active Signs and symptoms of pulmonary oxygen toxicity will be recognized and promptly addressed Date Initiated: 01/07/2015 Goal Status: Active Signs and symptoms of seizure will be recognized and promptly addressed ; seizing patients will suffer no harm Date Initiated: 01/07/2015 [name] ([id number]) Goal Status: Active Interventions: Administer a five (5) minute air break for patient if signs and symptoms of seizure appear and notify the hyperbaric physician Administer a ten (10) minute air break for patient if signs and symptoms of seizure appear and notify the hyperbaric physician Administer decongestants, per physician orders, prior to HBO2 Administer the correct therapeutic gas delivery based on the patients needs and limitations, per physician order Assess and provide for patientos comfort related to the hyperbaric environment and equalization of middle ear Assess for signs and symptoms related to adverse events, including but not limited to confinement anxiety, pneumothorax, oxygen toxicity and baurotrauma Assess patient for any history of confinement anxiety Assess patient's knowledge and expectations regarding hyperbaric medicine and provide education related to the hyperbaric environment, goals of treatment and prevention of adverse events Implement protocols to decrease risk of pneumothorax in high  risk patients Notes: Orientation to the Wound Care Program Nursing Diagnoses: Knowledge deficit related to the wound healing center program Goals: Patient/caregiver will verbalize understanding of the [facility] Program Date Initiated: 01/07/2015 Goal Status: Active Interventions: Provide education on orientation to the wound center Notes: Peripheral Neuropathy Nursing Diagnoses: Knowledge deficit related  to disease process and management of peripheral neurovascular dysfunction Potential alteration in peripheral tissue perfusion (select prior to confirmation of diagnosis) Goals: Patient/caregiver will verbalize understanding of disease process and disease management Date Initiated: 01/07/2015 Goal Status: Active [name], [name] ([id number]) Interventions: Assess signs and symptoms of neuropathy upon admission and as needed Provide education on Management of Neuropathy and Related Ulcers Provide education on Management of Neuropathy upon discharge from the [facility] for HBO Treatment Activities: Consult for HBO : 02/04/2015 Patient referred for customized footwear/offloading : 02/04/2015 Notes: Wound/Skin Impairment Nursing Diagnoses: Impaired tissue integrity Knowledge deficit related to ulceration/compromised skin integrity Goals: Patient/caregiver will verbalize understanding of skin care regimen Date Initiated: 01/07/2015 Goal Status: Active Ulcer/skin breakdown will have a volume reduction of 30% by week 4 Date Initiated: 01/07/2015 Goal Status: Active Ulcer/skin breakdown will have a volume reduction of 50% by week 8 Date Initiated: 01/07/2015 Goal Status: Active Ulcer/skin breakdown will have a volume reduction of 80% by week 12 Date Initiated: 01/07/2015 Goal Status: Active Ulcer/skin breakdown will heal within 14 weeks Date Initiated: 01/07/2015 Goal Status: Active Interventions: Assess patient/caregiver ability to obtain necessary supplies Assess patient/caregiver ability to perform ulcer/skin care regimen upon admission and as needed Assess ulceration(s) every visit Provide education on ulcer and skin care Treatment Activities: Referred to DME Ozetta Flatley for dressing supplies : 02/04/2015 Skin care regimen initiated : 02/04/2015 [name], [name] ([id number]) Topical wound management initiated : 02/04/2015 Notes: Electronic Signature(s) Signed: 02/04/2015 5:14:04 PM  By: Montey Hora Entered By: Montey Hora on 02/04/2015 12:50:33 [name] ([id number]) -------------------------------------------------------------------------------- Pain Assessment Details Patient Name: [name] Date of Service: 02/04/2015 12:45 PM Medical Record Number: [id number] Patient Account Number: [id number] Date of Birth/Sex: July 06, 1965 (50 y.o. Male) Treating RN: Montey Hora Primary Care Physician: [name] Other Clinician: Referring Physician: [name] Treating Physician/Extender: Frann Rider in Treatment: 4 Active Problems Location of Pain Severity and Description of Pain Patient Has Paino Yes Site Locations Pain Location: Pain in Ulcers With Dressing Change: Yes Duration of the Pain. Constant / Intermittento Constant Pain Management and Medication Current Pain Management: Electronic Signature(s) Signed: 02/04/2015 5:14:04 PM By: Montey Hora Entered By: Montey Hora on 02/04/2015 12:43:26 [name] ([id number]) -------------------------------------------------------------------------------- Patient/Caregiver Education Details Patient Name: [name] Date of Service: 02/04/2015 12:45 PM Medical Record Number: [id number] Patient Account Number: [id number] Date of Birth/Gender: [birth year] (49 y.o. Male) Treating RN: Montey Hora Primary Care Physician: [name] Other Clinician: Referring Physician: [name] Treating Physician/Extender: Frann Rider in Treatment: 4 Education Assessment Education Provided To: Patient Education Topics Provided Wound/Skin Impairment: Handouts: Other: wound care as ordered Methods: Demonstration, Explain/Verbal Responses: State content correctly Electronic Signature(s) Signed: 02/04/2015 1:22:46 PM By: Montey Hora Entered By: Montey Hora on 02/04/2015 13:22:46 [name]  ([id number]) -------------------------------------------------------------------------------- Wound Assessment Details Patient Name: [name] Date of Service: 02/04/2015 12:45 PM Medical Record Number: [id number] Patient Account Number: [id number] Date of Birth/Sex: [birth year] (50 y.o. Male) Treating RN: Montey Hora Primary Care Physician: [name] Other Clinician: Referring Physician: [name] Treating Physician/Extender: Frann Rider in Treatment: 4 Wound Status Wound Number: 1 Primary Diabetic Wound/Ulcer of the Lower  Etiology: Extremity Wound Location: Right Foot - Plantar Wound Status: Open Wounding Event: Gradually Appeared Comorbid Hypertension, Type II Diabetes, Date Acquired: 12/30/2014 History: Neuropathy Weeks Of Treatment: 4 Clustered Wound: No Photos Photo Uploaded By: Montey Hora on 02/04/2015 13:34:14 Wound Measurements Length: (cm) 1.5 Width: (cm) 1.6 Depth: (cm) 0.9 Area: (cm) 1.885 Volume: (cm) 1.696 % Reduction in Area: -140.1% % Reduction in Volume: -618.6% Epithelialization: None Tunneling: No Undermining: No Wound Description Classification: Grade 3 Wagner Verification: Abscess Wound Margin: Distinct, outline attached Exudate Amount: Large Exudate Type: Serosanguineous Exudate Color: red, brown Foul Odor After Cleansing: No Wound Bed Granulation Amount: Medium (34-66%) Exposed Structure Necrotic Amount: Small (1-33%) Fascia Exposed: No Necrotic Quality: Adherent Slough Fat Layer Exposed: No [name], [name] ([id number]) Tendon Exposed: No Muscle Exposed: No Joint Exposed: No Bone Exposed: No Limited to Skin Breakdown Periwound Skin Texture Texture Color No Abnormalities Noted: No No Abnormalities Noted: No Callus: Yes Atrophie Blanche: No Crepitus: No Cyanosis: No Excoriation: No Ecchymosis: No Fluctuance: No Erythema: No Friable: No Hemosiderin Staining: No Induration:  No Mottled: No Localized Edema: No Pallor: No Rash: No Rubor: No Scarring: No Temperature / Pain Moisture Temperature: No Abnormality No Abnormalities Noted: No Dry / Scaly: No Maceration: No Moist: Yes Wound Preparation Ulcer Cleansing: Rinsed/Irrigated with Saline Topical Anesthetic Applied: Other: Lidocaine 4%, Treatment Notes Wound #1 (Right, Plantar Foot) 1. Cleansed with: Clean wound with Normal Saline 2. Anesthetic Topical Lidocaine 4% cream to wound bed prior to debridement 4. Dressing Applied: Aquacel Ag 5. Secondary Dressing Applied Gauze and Kerlix/Conform 7. Secured with Tape Notes darco front Dietitian) Signed: 02/04/2015 5:14:04 PM By: Montey Hora Entered By: Montey Hora on 02/04/2015 12:49:18 [name], [name] ([id number]) [name], [name] ([id number]) -------------------------------------------------------------------------------- Vitals Details Patient Name: [name] Date of Service: 02/04/2015 12:45 PM Medical Record Number: [id number] Patient Account Number: [id number] Date of Birth/Sex: 1965/08/10 (50 y.o. Male) Treating RN: Montey Hora Primary Care Physician: [name] Other Clinician: Referring Physician: [name] Treating Physician/Extender: Frann Rider in Treatment: 4 Vital Signs Time Taken: 12:48 Temperature (F): 98.0 Height (in): 74 Pulse (bpm): 90 Weight (lbs): 288 Respiratory Rate (breaths/min): 18 Body Mass Index (BMI): 37 Blood Pressure (mmHg): 130/68 Capillary Blood Glucose (mg/dl): 149 Reference Range: 80 - 120 mg / dl Electronic Signature(s) Signed: 02/04/2015 5:14:04 PM By: Montey Hora Entered By: Montey Hora on 02/04/2015 12:48:42

## 2015-02-06 NOTE — Progress Notes (Signed)
[name], [name] ([id number]) Visit Report for 02/04/2015 Chief Complaint Document Details [name], [name] 02/04/2015 12:45 Patient Name: Date of Service: [name] Medical Record Patient Account Number: [id number] Number: Treating RN: Montey Hora Date of Birth/Sex: 1965-04-06 (50 y.o. Male) Other Clinician: Primary Care Physician: [name] Treating Christin Fudge Referring Physician: [name] Physician/Extender: Weeks in Treatment: 4 Information Obtained from: Patient Chief Complaint Patients presents for treatment of an open diabetic ulcer on the plantar aspect of the right foot which she's had for about 4 weeks Electronic Signature(s) Signed: 02/04/2015 1:20:56 PM By: Christin Fudge MD, FACS Entered By: Christin Fudge on 02/04/2015 13:20:56 [name] Simmers ([id number]) -------------------------------------------------------------------------------- HPI Details [name] 02/04/2015 12:45 Patient Name: Date of Service: [name] Medical Record Patient Account Number: [id number] Number: Treating RN: Montey Hora Date of Birth/Sex: 1965/10/02 (50 y.o. Male) Other Clinician: Primary Care Physician: [name] Treating Christin Fudge Referring Physician: [name] Physician/Extender: Weeks in Treatment: 4 History of Present Illness Location: plantar aspect of the right forefoot Quality: Patient reports No Pain. Severity: Patient states wound are getting worse. Duration: Patient has had the wound for < 4 weeks prior to presenting for treatment Timing: he has minimal discharge from the wound Context: The wound appeared gradually over time Modifying Factors: Other treatment(s) tried include:plan local care but not offloading Associated Signs and Symptoms: Patient reports having:no pain or discharge from the wound. HPI Description: This 50 year old male comes with an ulcerated area on the plantar aspect of the right  foot which she's had for approximately a month. I have known him from a previous visit at [facility] wound center and was treated in the months of April and May 2016 and rapidly healed a left plantar ulcer with a total contact cast. He has been a diabetic for about 16 years and tries to keep active and is fairly compliant with his diabetes management. 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 Past medical history significant for hypertension, hyperlipidemia, and status post appendectomy 1993. He does not smoke or drink alcohol. 01/14/2015 -- the patient had tolerated his total contact cast very well and had no problems and has had no systemic symptoms. However when his total contact cast was cut open he had excessive amount of purulent drainage in spite of being on antibiotics. He had had a recent x-ray done in the ER 12/22/2014 which showed IMPRESSION:No evidence of osseous erosion. Known soft tissue ulceration is not well characterized on radiograph. Scattered vascular calcifications seen. his last hemoglobin A1c in December was 7.3. He has been on Augmentin and doxycycline for the last 2 weeks. 01/21/2015 -- his culture grew rare growth of Pantoea species an MR moderate growth of Candida parapsilosis. it is sensitive to levofloxacin. He has not heard back from the insurance company regarding his hyperbaric oxygen therapy. His MRI has not been done yet and we will try and get him an earlier date 01/28/2015 -- MRI was done last night -- IMPRESSION:1. Soft tissue ulcer overlying the plantar aspect of the fifth metatarsal head extending to the cortex. Subcortical marrow edema in the fifth metatarsal head with corresponding T1 hypointensity is concerning for early osteomyelitis of the plantar lateral aspect of the fifth metatarsal head. Chest x-ray done on 01/14/2015 shows bronchiectatic changes without infiltrate. EKG done on generally 17 2017 shows a normal sinus rhythm and is a normal  EKG. [name], [name] ([id number]) 02/04/2015 -- he was asked to see Dr. [name] last week and had 2 appointments but had  to cancel both due to pressures of work. Last night he has woken up with severe pain in the foot and leg and it is swollen up. No fever or no change in his blood glucose. Addendum: I spoke with Dr. [name] who kindly agreed to accept the patient for inpatient therapy and have also opened to the hospitalist Dr. [name], and discuss details of the management including PICC line and repeat cultures. Electronic Signature(s) Signed: 02/04/2015 2:12:51 PM By: Christin Fudge MD, FACS Previous Signature: 02/04/2015 1:21:32 PM Version By: Christin Fudge MD, FACS Entered By: Christin Fudge on 02/04/2015 14:12:50 [name], [name] ([id number]) -------------------------------------------------------------------------------- Physical Exam Details [name] 02/04/2015 12:45 Patient Name: Date of Service: [name] Medical Record Patient Account Number: [id number] Number: Treating RN: Montey Hora Date of Birth/Sex: [birth year] (50 y.o. Male) Other Clinician: Primary Care Physician: [name] Treating Christin Fudge Referring Physician: [name] Physician/Extender: Weeks in Treatment: 4 Constitutional . Pulse regular. Respirations normal and unlabored. Afebrile. . Eyes Nonicteric. Reactive to light. Ears, Nose, Mouth, and Throat Lips, teeth, and gums WNL.Marland Kitchen Moist mucosa without lesions. Neck supple and nontender. No palpable supraclavicular or cervical adenopathy. Normal sized without goiter. Respiratory WNL. No retractions.. Cardiovascular Pedal Pulses WNL. No clubbing, cyanosis or edema. Lymphatic No adneopathy. No adenopathy. No adenopathy. Musculoskeletal Adexa without tenderness or enlargement.. Digits and nails w/o clubbing, cyanosis, infection, petechiae, ischemia, or inflammatory conditions.. Integumentary (Hair, Skin) No  suspicious lesions. No crepitus or fluctuance. No peri-wound warmth or erythema. No masses.Marland Kitchen Psychiatric Judgement and insight Intact.. No evidence of depression, anxiety, or agitation.. Notes the wound probes down to bone and there is no evidence of cross purulent material but he has signs of cellulitis and swelling of the foot. No debridement was required today. Electronic Signature(s) Signed: 02/04/2015 1:22:53 PM By: Christin Fudge MD, FACS Entered By: Christin Fudge on 02/04/2015 13:22:53 [name], [name] ([id number]) -------------------------------------------------------------------------------- Physician Orders Details [name], [name] 02/04/2015 12:45 Patient Name: Date of Service: [name] Medical Record Patient Account Number: [id number] Number: Treating RN: Montey Hora Date of Birth/Sex: 06/27/1965 (50 y.o. Male) Other Clinician: Primary Care Physician: [name] Treating Christin Fudge Referring Physician: [name] Physician/Extender: Suella [place] in Treatment: 4 Verbal / Phone Orders: Yes Clinician: Montey Hora Read Back and Verified: Yes Diagnosis Coding Wound Cleansing Wound #1 Right,Plantar Foot o Cleanse wound with mild soap and water o May Shower, gently pat wound dry prior to applying new dressing. Anesthetic Wound #1 Right,Plantar Foot o Topical Lidocaine 4% cream applied to wound bed prior to debridement Primary Wound Dressing Wound #1 Right,Plantar Foot o Aquacel Ag Secondary Dressing Wound #1 Right,Plantar Foot o Gauze and Kerlix/Conform Dressing Change Frequency Wound #1 Right,Plantar Foot o Change dressing every day. Follow-up Appointments Wound #1 Right,Plantar Foot o Return Appointment in 1 week. Off-Loading Wound #1 Right,Plantar Foot o Open toe surgical shoe with peg assist. - front offload Electronic Signature(s) Signed: 02/04/2015 3:59:20 PM By: Christin Fudge MD, FACS Signed: 02/04/2015 5:14:04 PM By:  Mare Ferrari ([id number]) Entered By: Montey Hora on 02/04/2015 12:59:27 DIOVANNI, HOOFNAGLE ([id number]) -------------------------------------------------------------------------------- Problem List Details [name], [name] 02/04/2015 12:45 Patient Name: Date of Service: [name] Medical Record Patient Account Number: [id number] Number: Treating RN: Montey Hora Date of Birth/Sex: November 30, 1965 (50 y.o. 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 Medical Record Patient Account Number: [id number] Number: Treating RN: Montey Hora Date of Birth/Sex: 1965-05-16 (50 y.o. Male) Other Clinician: Primary Care Physician: [name] Treating Nnenna Meador Referring Physician: [name] Physician/Extender: Weeks in Treatment: 4 Subjective Chief Complaint Information obtained from Patient Patients presents for treatment of an open diabetic ulcer on the plantar aspect of the right foot which she's had for about 4 weeks History of  Present Illness (HPI) The following HPI elements were documented for the patient's wound: Location: plantar aspect of the right forefoot Quality: Patient reports No Pain. Severity: Patient states wound are getting worse. Duration: Patient has had the wound for < 4 weeks prior to presenting for treatment Timing: he has minimal discharge from the wound Context: The wound appeared gradually over time Modifying Factors: Other treatment(s) tried include:plan local care but not offloading Associated Signs and Symptoms: Patient reports having:no pain or discharge from the wound. This 50 year old male comes with an ulcerated area on the plantar aspect of the right foot which she's had for approximately a month. I have known him from a previous visit at [facility] wound center and was treated in the months of April and May 2016 and rapidly healed a left plantar ulcer with a total contact cast. He has been a diabetic for about 16 years and tries to keep active and is fairly compliant with his diabetes management. 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 Past medical history significant for hypertension, hyperlipidemia, and status post appendectomy 1993. He does not smoke or drink alcohol. 01/14/2015 -- the patient had tolerated his total contact cast very well and had no problems and has had no systemic symptoms. However when his total contact cast was cut open he had excessive amount of purulent drainage in spite of being on antibiotics. He had had a recent x-ray done in the ER 12/22/2014 which showed IMPRESSION:No evidence of osseous erosion. Known soft tissue ulceration is not well characterized on radiograph. Scattered vascular calcifications seen. his last hemoglobin A1c in December was 7.3. He has been on Augmentin and doxycycline for the last 2 weeks. 01/21/2015 -- his culture grew rare growth of Pantoea species an MR moderate growth of Candida parapsilosis. it is sensitive to  levofloxacin. He has not heard back from the insurance company regarding his hyperbaric oxygen therapy. [name], [name] ([id number]) His MRI has not been done yet and we will try and get him an earlier date 01/28/2015 -- MRI was done last night -- IMPRESSION:1. Soft tissue ulcer overlying the plantar aspect of the fifth metatarsal head extending to the cortex. Subcortical marrow edema in the fifth metatarsal head with corresponding T1 hypointensity is concerning for early osteomyelitis of the plantar lateral aspect of the fifth metatarsal head. Chest x-ray done on 01/14/2015 shows bronchiectatic changes without infiltrate. EKG done on generally 17 2017 shows a normal sinus rhythm and is a normal EKG. 02/04/2015 -- he was asked to see Dr. [name] last week and had 2 appointments but had to cancel both due to  pressures of work. Last night he has woken up with severe pain in the foot and leg and it is swollen up. No fever or no change in his blood glucose. Addendum: I spoke with Dr. [name] who kindly agreed to accept the patient for inpatient therapy and have also opened to the hospitalist Dr. [name], and discuss details of the management including PICC line and repeat cultures. Objective Constitutional Pulse regular. Respirations normal and unlabored. Afebrile. Vitals Time Taken: 12:48 PM, Height: 74 in, Weight: 288 lbs, BMI: 37, Temperature: 98.0 F, Pulse: 90 bpm, Respiratory Rate: 18 breaths/min, Blood Pressure: 130/68 mmHg, Capillary Blood Glucose: 149 mg/dl. Eyes Nonicteric. Reactive to light. Ears, Nose, Mouth, and Throat Lips, teeth, and gums WNL.Marland Kitchen Moist mucosa without lesions. Neck supple and nontender. No palpable supraclavicular or cervical adenopathy. Normal sized without goiter. Respiratory WNL. No retractions.. Cardiovascular Pedal Pulses WNL. No clubbing, cyanosis or edema. Lymphatic No adneopathy. No adenopathy. No adenopathy. [name], [name]  ([id number]) Musculoskeletal Adexa without tenderness or enlargement.. Digits and nails w/o clubbing, cyanosis, infection, petechiae, ischemia, or inflammatory conditions.Marland Kitchen Psychiatric Judgement and insight Intact.. No evidence of depression, anxiety, or agitation.. General Notes: the wound probes down to bone and there is no evidence of cross purulent material but he has signs of cellulitis and swelling of the foot. No debridement was required today. Integumentary (Hair, Skin) No suspicious lesions. No crepitus or fluctuance. No peri-wound warmth or erythema. No masses.. Wound #1 status is Open. Original cause of wound was Gradually Appeared. The wound is located on the [place]. The wound measures 1.5cm length x 1.6cm width x 0.9cm depth; 1.885cm^2 area and 1.696cm^3 volume. The wound is limited to skin breakdown. There is no tunneling or undermining noted. There is a large amount of serosanguineous drainage noted. The wound margin is distinct with the outline attached to the wound base. There is medium (34-66%) granulation within the wound bed. There is a small (1-33%) amount of necrotic tissue within the wound bed including Adherent Slough. The periwound skin appearance exhibited: Callus, Moist. The periwound skin appearance did not exhibit: Crepitus, Excoriation, Fluctuance, Friable, Induration, Localized Edema, Rash, Scarring, Dry/Scaly, Maceration, Atrophie Blanche, Cyanosis, Ecchymosis, Hemosiderin Staining, Mottled, Pallor, Rubor, Erythema. Periwound temperature was noted as No Abnormality. Assessment Active Problems ICD-10 E11.621 - Type 2 diabetes mellitus with foot ulcer L97.512 - 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d L84 - Corns and callosities L02.611 - Cutaneous abscess of right foot M86.371 - Chronic multifocal osteomyelitis, right ankle and foot Though the patient understands the gravity of his situation he has missed 2  appointments to see Dr. [name] and has been off all antibiotics since the weekend. He now has florid cellulitis and needs to be seen for either IV or oral antibiotics ASAP. We'll try to contact Dr. [name] and if need be admitted to the hospital at [facility]. [name], [name] ([id number]) I have sat him down and give him detailed talk about this severity of his condition and him not following my advice from last week. He understands the risk of amputation and may end up with a serious septicemia. He will wait to get the appropriate admission orders after ispeak with either the hospitalist or Dr. [name]. Plan Wound Cleansing: Wound #1 Right,Plantar Foot: Cleanse wound with mild soap and water May Shower, gently pat wound dry prior to applying new dressing. Anesthetic: Wound #1 Right,Plantar Foot: Topical Lidocaine 4% cream applied to wound bed prior to  debridement Primary Wound Dressing: Wound #1 Right,Plantar Foot: Aquacel Ag Secondary Dressing: Wound #1 Right,Plantar Foot: Gauze and Kerlix/Conform Dressing Change Frequency: Wound #1 Right,Plantar Foot: Change dressing every day. Follow-up Appointments: Wound #1 Right,Plantar Foot: Return Appointment in 1 week. Off-Loading: Wound #1 Right,Plantar Foot: Open toe surgical shoe with peg assist. - front offload Though the patient understands the gravity of his situation he has missed 2 appointments to see Dr. [name] and has been off all antibiotics since the weekend. He now has florid cellulitis and needs to be seen for either IV or oral antibiotics ASAP. We'll try to contact Dr. [name] and if need be admitted to the hospital at [facility]. I have sat him down and give him detailed talk about this severity of his condition and him not following my advice from last week. He understands the risk of amputation and may end up with a serious septicemia. He will wait to  get the appropriate admission orders after ispeak with either the hospitalist or Dr. [name]. Electronic Signature(s) Signed: 02/04/2015 2:13:32 PM By: Christin Fudge MD, FACS Previous Signature: 02/04/2015 2:13:09 PM Version By: Christin Fudge MD, FACS [name], KURTYKA ([id number]) Previous Signature: 02/04/2015 1:26:53 PM Version By: Christin Fudge MD, FACS Entered By: Christin Fudge on 02/04/2015 14:13:32 MOLLIE, CALLEROS ([id number]) -------------------------------------------------------------------------------- SuperBill Details Patient Name: [name] Simmers Date of Service: 02/04/2015 Medical Record Number: [id number] Patient Account Number: [id number] Date of Birth/Sex: 1965-04-08 (50 y.o. Male) Treating RN: Montey Hora Primary Care Physician: [name] Other Clinician: Referring Physician: [name] Treating Physician/Extender: Frann Rider in Treatment: 4 Diagnosis Coding ICD-10 Codes Code Description [phone] Type 2 diabetes mellitus with foot ulcer L97.512 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d L84 Corns and callosities L02.611 Cutaneous abscess of right foot M86.371 Chronic multifocal osteomyelitis, right ankle and foot Facility Procedures CPT4 Code: [id number] Description: [id number] - WOUND CARE VISIT-LEV 2 EST PT Modifier: Quantity: 1 Physician Procedures CPT4 Code Description: [id number] - WC PHYS LEVEL 3 - EST PT ICD-10 Description Diagnosis E11.621 Type 2 diabetes mellitus with foot ulcer L97.512 Non-pressure chronic ulcer of other part of right fo L02.611 Cutaneous abscess of right foot M86.371  Chronic multifocal osteomyelitis, right ankle and fo Modifier: ot with fat lay ot Quantity: 1 er exposed Electronic Signature(s) Signed: 02/04/2015 1:27:29 PM By: Christin Fudge MD, FACS Entered By: Christin Fudge on 02/04/2015 13:27:29

## 2015-02-08 ENCOUNTER — Encounter: Payer: Managed Care, Other (non HMO) | Admitting: Surgery

## 2015-02-08 DIAGNOSIS — M86371 Chronic multifocal osteomyelitis, right ankle and foot: Secondary | ICD-10-CM | POA: Diagnosis not present

## 2015-02-08 DIAGNOSIS — E1140 Type 2 diabetes mellitus with diabetic neuropathy, unspecified: Secondary | ICD-10-CM | POA: Diagnosis not present

## 2015-02-08 DIAGNOSIS — E785 Hyperlipidemia, unspecified: Secondary | ICD-10-CM | POA: Diagnosis not present

## 2015-02-08 DIAGNOSIS — L84 Corns and callosities: Secondary | ICD-10-CM | POA: Diagnosis not present

## 2015-02-08 DIAGNOSIS — Z794 Long term (current) use of insulin: Secondary | ICD-10-CM | POA: Diagnosis not present

## 2015-02-08 DIAGNOSIS — L02611 Cutaneous abscess of right foot: Secondary | ICD-10-CM | POA: Diagnosis not present

## 2015-02-08 DIAGNOSIS — L97512 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d: Secondary | ICD-10-CM | POA: Diagnosis not present

## 2015-02-08 DIAGNOSIS — E11621 Type 2 diabetes mellitus with foot ulcer: Secondary | ICD-10-CM | POA: Diagnosis present

## 2015-02-08 DIAGNOSIS — I10 Essential (primary) hypertension: Secondary | ICD-10-CM | POA: Diagnosis not present

## 2015-02-08 LAB — GLUCOSE, CAPILLARY
Glucose-Capillary: 252 mg/dL — ABNORMAL HIGH (ref 65–99)
Glucose-Capillary: 244 mg/dL — ABNORMAL HIGH (ref 65–99)

## 2015-02-08 LAB — WOUND CULTURE

## 2015-02-09 ENCOUNTER — Encounter: Payer: Managed Care, Other (non HMO) | Admitting: Internal Medicine

## 2015-02-09 DIAGNOSIS — E11621 Type 2 diabetes mellitus with foot ulcer: Secondary | ICD-10-CM | POA: Diagnosis not present

## 2015-02-09 LAB — GLUCOSE, CAPILLARY
Glucose-Capillary: 156 mg/dL — ABNORMAL HIGH (ref 65–99)
Glucose-Capillary: 171 mg/dL — ABNORMAL HIGH (ref 65–99)

## 2015-02-09 NOTE — Progress Notes (Signed)
RANDAHL, ABDUL ([id number]) Visit Report for 02/08/2015 HBO Details Patient Name: [name], [name]. Date of Service: 02/08/2015 1:30 PM Medical Record Number: [id number] Patient Account Number: [id number] Date of Birth/Sex: Oct 29, 1965 (50 y.o. Male) Treating RN: Primary Care Physician: [name] Other Clinician: [name] Referring Physician: [name] Treating Physician/Extender: Frann Rider in Treatment: 4 HBO Treatment Course Details Treatment Course Ordering Physician: Christin Fudge 1 Number: HBO Treatment Start Date: 02/01/2015 Total Treatments 30 Ordered: HBO Indication: Diabetic Ulcer(s) of the Lower Extremity HBO Treatment Details Treatment Number: 5 Patient Type: Outpatient Chamber Type: Monoplace Chamber Serial #: [id number] Treatment Protocol: 2.0 ATA with 90 minutes oxygen, and no air breaks Treatment Details Compression Rate Down: 1.5 psi / minute De-Compression Rate Up: 1.5 psi / minute Air breaks and breathing Compress Tx Pressure periods Decompress Decompress Begins Reached (leave unused spaces Begins Ends blank) Chamber Pressure (ATA) 1 2 - - - - - - 2 1 Clock Time (24 hr) 12:57 13:09 - - - - - - 14:39 14:50 Treatment Length: 113 (minutes) Treatment Segments: 4 Capillary Blood Glucose Pre Capillary Blood Glucose (mg/dl): Post Capillary Blood Glucose (mg/dl): Vital Signs Capillary Blood Glucose Reference Range: 80 - 120 mg / dl HBO Diabetic Blood Glucose Intervention Range: <131 mg/dl or >249 mg/dl Time Vitals Blood Respiratory Capillary Blood Glucose Pulse Action Type: Pulse: Temperature: Taken: Pressure: Rate: Glucose (mg/dl): Meter #: Oximetry (%) Taken: Pre 12:49 144/78 78 18 98.2 252 1 none Post 14:56 132/60 72 18 98.3 244 1 none Treatment Response Treatment Completion Status: Treatment Completed without Adverse Event ARMONIE, CRISSINGER ([id number]) HBO Attestation I certify that I supervised this HBO  treatment in accordance with Medicare guidelines. A trained Yes emergency response team is readily available per hospital policies and procedures. Continue HBOT as ordered. Yes Electronic Signature(s) Signed: 02/08/2015 3:41:40 PM By: Christin Fudge MD, FACS Previous Signature: 02/08/2015 3:38:53 PM Version By: Lorine Bears RCP, RRT, CHT Entered By: Christin Fudge on 02/08/2015 15:41:40 [name] -------------------------------------------------------------------------------- HBO Safety Checklist Details Patient Name: [name] Date of Service: 02/08/2015 1:30 PM Medical Record Number: [id number] Patient Account Number: [id number] Date of Birth/Sex: 1965-01-10 (50 y.o. Male) Treating RN: Primary Care Physician: [name] Other Clinician: [name] Referring Physician: [name] Treating Physician/Extender: Frann Rider in Treatment: 4 HBO Safety Checklist Items Safety Checklist Consent Form Signed Patient voided / foley secured and emptied When did you last eato 11:30 am Last dose of injectable or oral agent 11:30 am NA Ostomy pouch emptied and vented if applicable NA All implantable devices assessed, documented and approved Intravenous access site secured and place PICC line Valuables secured Linens and cotton and cotton/polyester blend (less than 51% polyester) Personal oil-based products / skin lotions / body lotions removed Wigs or hairpieces removed Smoking or tobacco materials removed Books / newspapers / magazines / loose paper removed Cologne, aftershave, perfume and deodorant removed Jewelry removed (may wrap wedding band) Make-up removed Hair care products removed Battery operated devices (external) removed Heating patches and chemical warmers removed NA Titanium eyewear removed NA Nail polish cured greater than 10 hours NA Casting material cured greater than 10 hours NA Hearing aids  removed NA Loose dentures or partials removed NA Prosthetics have been removed Patient demonstrates correct use of air break device (if applicable) Patient concerns have been addressed Patient grounding bracelet on and cord attached to chamber Specifics for Inpatients (complete in addition to above) Medication sheet sent with patient Intravenous medications needed  or due during therapy sent with patient JOMES, POPELKA ([id number]) Drainage tubes (e.g. nasogastric tube or chest tube secured and vented) Endotracheal or Tracheotomy tube secured Cuff deflated of air and inflated with saline Airway suctioned Electronic Signature(s) Signed: 02/08/2015 3:38:53 PM By: Lorine Bears RCP, RRT, CHT Entered By: Lorine Bears on 02/08/2015 13:05:25

## 2015-02-09 NOTE — Progress Notes (Signed)
BALLARD, THIBODEAUX ([id number]) Visit Report for 02/08/2015 Arrival Information Details Patient Name: [name], [name]. Date of Service: 02/08/2015 1:30 PM Medical Record Number: [id number] Patient Account Number: [id number] Date of Birth/Sex: 1965-06-21 (50 y.o. Male) Treating RN: Primary Care Physician: [name] Other Clinician: [name] Referring Physician: [name] Treating Physician/Extender: Frann Rider in Treatment: 4 Visit Information History Since Last Visit Added or deleted any medications: No Patient Arrived: Ambulatory Any new allergies or adverse reactions: No Arrival Time: 12:45 Had a fall or experienced change in No Accompanied By: wife and activities of daily living that may affect daughter risk of falls: Transfer Assistance: None Signs or symptoms of abuse/neglect No Patient Identification Verified: Yes since last visito Secondary Verification Process Yes Hospitalized since last visit: No Completed: Has Dressing in Place as Prescribed: Yes Patient Requires Transmission- No Has Footwear/Offloading in Place as Yes Based Precautions: Prescribed: Patient Has Alerts: Yes Left: Surgical Shoe with Pressure Relief Insole Pain Present Now: No Electronic Signature(s) Signed: 02/08/2015 3:38:53 PM By: Lorine Bears RCP, RRT, CHT Entered By: Lorine Bears on 02/08/2015 13:02:49 [name] ([id number]) -------------------------------------------------------------------------------- Encounter Discharge Information Details Patient Name: [name] Date of Service: 02/08/2015 1:30 PM Medical Record Number: [id number] Patient Account Number: [id number] Date of Birth/Sex: 11-20-1965 (50 y.o. Male) Treating RN: Primary Care Physician: [name] Other Clinician: [name] Referring Physician: [name] Treating Physician/Extender: Frann Rider in Treatment:  4 Encounter Discharge Information Items Discharge Pain Level: 0 Discharge Condition: Stable Ambulatory Status: Ambulatory Discharge Destination: Home Private Transportation: Auto Accompanied By: self Schedule Follow-up Appointment: No Medication Reconciliation completed and No provided to Patient/Care Jaylenn Baiza: Clinical Summary of Care: Notes Patient has an HBO treatment scheduled on 02/09/15 at 13:30 pm, Electronic Signature(s) Signed: 02/08/2015 3:38:53 PM By: Lorine Bears RCP, RRT, CHT Entered By: Lorine Bears on 02/08/2015 15:36:03 [name] ([id number]) -------------------------------------------------------------------------------- Vitals Details Patient Name: [name] Date of Service: 02/08/2015 1:30 PM Medical Record Number: [id number] Patient Account Number: [id number] Date of Birth/Sex: 12/06/1965 (50 y.o. Male) Treating RN: Primary Care Physician: [name] Other Clinician: [name] Referring Physician: [name] Treating Physician/Extender: Frann Rider in Treatment: 4 Vital Signs Time Taken: 12:49 Temperature (F): 98.2 Height (in): 74 Pulse (bpm): 78 Weight (lbs): 288 Respiratory Rate (breaths/min): 18 Body Mass Index (BMI): 37 Blood Pressure (mmHg): 144/78 Capillary Blood Glucose (mg/dl): 252 Reference Range: 80 - 120 mg / dl Electronic Signature(s) Signed: 02/08/2015 3:38:53 PM By: Lorine Bears RCP, RRT, CHT Entered By: Lorine Bears on 02/08/2015 13:03:42

## 2015-02-10 ENCOUNTER — Encounter: Payer: Managed Care, Other (non HMO) | Admitting: Internal Medicine

## 2015-02-10 DIAGNOSIS — E119 Type 2 diabetes mellitus without complications: Secondary | ICD-10-CM | POA: Insufficient documentation

## 2015-02-10 DIAGNOSIS — E11621 Type 2 diabetes mellitus with foot ulcer: Secondary | ICD-10-CM | POA: Diagnosis not present

## 2015-02-10 LAB — GLUCOSE, CAPILLARY
Glucose-Capillary: 191 mg/dL — ABNORMAL HIGH (ref 65–99)
Glucose-Capillary: 166 mg/dL — ABNORMAL HIGH (ref 65–99)

## 2015-02-10 NOTE — Progress Notes (Signed)
KIT, DELUCA ([id number]) Visit Report for 02/09/2015 HBO Details [name] Date of Service: 02/09/2015 1:30 PM Patient Name: J. Patient Account Number: [id number] Medical Record Treating RN: [id number] Number: Other Clinician: [name] Date of Birth/Sex: Jan 28, 1965 (50 y.o. Male) Treating [name], [name] Primary Care Physician/Extender: Floyde Parkins Physician: Referring Physician: Sharilyn Sites in Treatment: 4 HBO Treatment Course Details Treatment Course Ordering Physician: Christin Fudge 1 Number: HBO Treatment Start Date: 02/01/2015 Total Treatments 30 Ordered: HBO Indication: Diabetic Ulcer(s) of the Lower Extremity HBO Treatment Details Treatment Number: 6 Patient Type: Outpatient Chamber Type: Monoplace Chamber Serial #: [id number] Treatment Protocol: 2.0 ATA with 90 minutes oxygen, and no air breaks Treatment Details Compression Rate Down: 1.5 psi / minute De-Compression Rate Up: 1.5 psi / minute Air breaks and breathing Compress Tx Pressure periods Decompress Decompress Begins Reached (leave unused spaces Begins Ends blank) Chamber Pressure (ATA) 1 2 - - - - - - 2 1 Clock Time (24 hr) 12:58 13:10 - - - - - - 14:40 14:50 Treatment Length: 112 (minutes) Treatment Segments: 4 Capillary Blood Glucose Pre Capillary Blood Glucose (mg/dl): Post Capillary Blood Glucose (mg/dl): Vital Signs Capillary Blood Glucose Reference Range: 80 - 120 mg / dl HBO Diabetic Blood Glucose Intervention Range: <131 mg/dl or >249 mg/dl Time Vitals Blood Respiratory Capillary Blood Glucose Pulse Action Type: Pulse: Temperature: Taken: Pressure: Rate: Glucose (mg/dl): Meter #: Oximetry (%) Taken: Pre 12:50 110/68 96 18 97.6 156 1 none Post 14:56 124/70 84 18 98.3 171 1 none Axe, Gillian J. ([id number]) Treatment Response Treatment Completion Status: Treatment Completed without Adverse Event Physician Notes No concerns with treatment given HBO  Attestation I certify that I supervised this HBO treatment in accordance with Medicare guidelines. A trained Yes emergency response team is readily available per hospital policies and procedures. Continue HBOT as ordered. Yes Electronic Signature(s) Signed: 02/09/2015 3:38:56 PM By: Linton Ham MD Entered By: Linton Ham on 02/09/2015 15:37:13 [name] ([id number]) -------------------------------------------------------------------------------- HBO Safety Checklist Details [name] Date of Service: 02/09/2015 1:30 PM Patient Name: J. Patient Account Number: [id number] Medical Record Treating RN: [id number] Number: Other Clinician: [name] Date of Birth/Sex: December 17, 1965 (50 y.o. Male) Treating [name], [name] Primary Care Physician/Extender: Floyde Parkins Physician: Referring Physician: Sharilyn Sites in Treatment: 4 HBO Safety Checklist Items Safety Checklist Consent Form Signed Patient voided / foley secured and emptied 10:30 am meal; Ensure 12:30 When did you last eato pm Last dose of injectable or oral agent 10:30 am NA Ostomy pouch emptied and vented if applicable NA All implantable devices assessed, documented and approved Intravenous access site secured and place Valuables secured Linens and cotton and cotton/polyester blend (less than 51% polyester) Personal oil-based products / skin lotions / body lotions removed Wigs or hairpieces removed Smoking or tobacco materials removed Books / newspapers / magazines / loose paper removed Cologne, aftershave, perfume and deodorant removed Jewelry removed (may wrap wedding band) Make-up removed Hair care products removed Battery operated devices (external) removed Heating patches and chemical warmers removed NA Titanium eyewear removed NA Nail polish cured greater than 10 hours NA Casting material cured greater than 10 hours NA Hearing aids removed NA Loose dentures or  partials removed NA Prosthetics have been removed Patient demonstrates correct use of air break device (if applicable) Patient concerns have been addressed Patient grounding bracelet on and cord attached to chamber Specifics for Inpatients (complete in addition to above) THATCHER, LOCKAMY. ([id number]) Medication sheet sent with patient Intravenous medications  needed or due during therapy sent with patient Drainage tubes (e.g. nasogastric tube or chest tube secured and vented) Endotracheal or Tracheotomy tube secured Cuff deflated of air and inflated with saline Airway suctioned Electronic Signature(s) Signed: 02/09/2015 4:09:46 PM By: Lorine Bears RCP, RRT, CHT Entered By: Lorine Bears on 02/09/2015 13:14:41

## 2015-02-10 NOTE — Progress Notes (Signed)
GIANG, PALLESCHI ([id number]) Visit Report for 02/09/2015 Arrival Information Details [name] Date of Service: 02/09/2015 1:30 PM Patient Name: J. Patient Account Number: [id number] Medical Record Treating RN: [id number] Number: Other Clinician: [name] Date of Birth/Sex: [birth year] (50 y.o. Male) Treating [name], [place] Primary Care Physician/Extender: Floyde Parkins Physician: Referring Physician: Sharilyn Sites in Treatment: 4 Visit Information History Since Last Visit Added or deleted any medications: No Patient Arrived: Ambulatory Any new allergies or adverse reactions: No Arrival Time: 12:45 Had a fall or experienced change in No Accompanied By: self activities of daily living that may affect Transfer Assistance: None risk of falls: Patient Identification Verified: Yes Signs or symptoms of abuse/neglect No Secondary Verification Process Yes since last visito Completed: Hospitalized since last visit: No Patient Requires Transmission-Based No Has Dressing in Place as Prescribed: Yes Precautions: Has Footwear/Offloading in Place as Yes Patient Has Alerts: Yes Prescribed: Left: Surgical Shoe with Pressure Relief Insole Pain Present Now: No Electronic Signature(s) Signed: 02/09/2015 4:09:46 PM By: Lorine Bears RCP, RRT, CHT Entered By: Becky Sax, Amado Nash on 02/09/2015 13:12:35 [name] ([id number]) -------------------------------------------------------------------------------- Encounter Discharge Information Details [name] Date of Service: 02/09/2015 1:30 PM Patient Name: J. Patient Account Number: [id number] Medical Record Treating RN: [id number] Number: Other Clinician: [name] Date of Birth/Sex: [birth year] (50 y.o. Male) Treating [name], [name] Primary Care Physician/Extender: Floyde Parkins Physician: Referring Physician: Sharilyn Sites in Treatment:  4 Encounter Discharge Information Items Discharge Pain Level: 0 Discharge Condition: Stable Ambulatory Status: Ambulatory Discharge Destination: Home Private Transportation: Auto Accompanied By: self Schedule Follow-up Appointment: No Medication Reconciliation completed and No provided to Patient/Care Ebubechukwu Jedlicka: Clinical Summary of Care: Notes Patient has an HBO treatment scheduled on2/8/17 at 13:00 pm. Electronic Signature(s) Signed: 02/09/2015 4:09:46 PM By: Lorine Bears RCP, RRT, CHT Entered By: Becky Sax, Amado Nash on 02/09/2015 15:15:07 [name] ([id number]) -------------------------------------------------------------------------------- Vitals Details [name] Date of Service: 02/09/2015 1:30 PM Patient Name: J. Patient Account Number: [id number] Medical Record Treating RN: [id number] Number: Other Clinician: [name] Date of Birth/Sex: [birth year] (50 y.o. Male) Treating [name], [name] Primary Care Physician/Extender: Floyde Parkins Physician: Referring Physician: Sharilyn Sites in Treatment: 4 Vital Signs Time Taken: 12:50 Temperature (F): 97.6 Height (in): 74 Pulse (bpm): 96 Weight (lbs): 288 Respiratory Rate (breaths/min): 18 Body Mass Index (BMI): 37 Blood Pressure (mmHg): 110/68 Capillary Blood Glucose (mg/dl): 156 Reference Range: 80 - 120 mg / dl Electronic Signature(s) Signed: 02/09/2015 4:09:46 PM By: Lorine Bears RCP, RRT, CHT Entered By: Lorine Bears on 02/09/2015 13:13:34

## 2015-02-11 ENCOUNTER — Ambulatory Visit: Payer: Managed Care, Other (non HMO) | Admitting: Surgery

## 2015-02-11 ENCOUNTER — Encounter: Payer: Managed Care, Other (non HMO) | Admitting: Surgery

## 2015-02-11 DIAGNOSIS — E11621 Type 2 diabetes mellitus with foot ulcer: Secondary | ICD-10-CM | POA: Diagnosis not present

## 2015-02-11 LAB — GLUCOSE, CAPILLARY
Glucose-Capillary: 218 mg/dL — ABNORMAL HIGH (ref 65–99)
Glucose-Capillary: 119 mg/dL — ABNORMAL HIGH (ref 65–99)

## 2015-02-11 NOTE — Progress Notes (Signed)
[name], [name] ([id number]) Visit Report for 02/10/2015 Arrival Information Details [name] Date of Service: 02/10/2015 1:30 PM Patient Name: J. Patient Account Number: [id number] Medical Record Treating RN: [id number] Number: Other Clinician: [name] Date of Birth/Sex: 1965-09-26 (50 y.o. Male) Treating Dellia Nims, [name] Primary Care Physician/Extender: Floyde Parkins Physician: Referring Physician: Sharilyn Sites in Treatment: 4 Visit Information History Since Last Visit Added or deleted any medications: No Patient Arrived: Ambulatory Any new allergies or adverse reactions: No Arrival Time: 12:45 Had a fall or experienced change in No Accompanied By: self activities of daily living that may affect Transfer Assistance: None risk of falls: Patient Requires Transmission-Based No Signs or symptoms of abuse/neglect No Precautions: since last visito Patient Has Alerts: Yes Hospitalized since last visit: No Has Dressing in Place as Prescribed: Yes Has Footwear/Offloading in Place as Yes Prescribed: Left: Surgical Shoe with Pressure Relief Insole Pain Present Now: No Electronic Signature(s) Signed: 02/10/2015 3:48:19 PM By: Lorine Bears RCP, RRT, CHT Entered By: Lorine Bears on 02/10/2015 13:00:12 [name] -------------------------------------------------------------------------------- Encounter Discharge Information Details [name] Date of Service: 02/10/2015 1:30 PM Patient Name: J. Patient Account Number: [id number] Medical Record Treating RN: [id number] Number: Other Clinician: [name] Date of Birth/Sex: 15-Feb-1965 (50 y.o. Male) Treating [name] Primary Care Physician/Extender: Floyde Parkins Physician: Referring Physician: Sharilyn Sites in Treatment: 4 Encounter Discharge Information Items Discharge Pain Level: 0 Discharge Condition:  Stable Ambulatory Status: Ambulatory Discharge Destination: Home Private Transportation: Auto Accompanied By: self Schedule Follow-up Appointment: No Medication Reconciliation completed and No provided to Patient/Care Lilyauna [name]: Clinical Summary of Care: Notes Patient has an HBO treatment scheduled on 02/11/15 at 13:30 pm. Electronic Signature(s) Signed: 02/10/2015 3:48:19 PM By: Lorine Bears RCP, RRT, CHT Entered By: Becky Sax, Amado Nash on 02/10/2015 15:27:34 [name] -------------------------------------------------------------------------------- Vitals Details [name] Date of Service: 02/10/2015 1:30 PM Patient Name: J. Patient Account Number: [id number] Medical Record Treating RN: [id number] Number: Other Clinician: [name] Date of Birth/Sex: [birth year] (50 y.o. Male) Treating [name] Primary Care Physician/Extender: Floyde Parkins Physician: Referring Physician: Sharilyn Sites in Treatment: 4 Vital Signs Time Taken: 12:49 Temperature (F): 98.2 Height (in): 74 Pulse (bpm): 90 Weight (lbs): 288 Respiratory Rate (breaths/min): 18 Body Mass Index (BMI): 37 Blood Pressure (mmHg): 108/68 Capillary Blood Glucose (mg/dl): 191 Reference Range: 80 - 120 mg / dl Electronic Signature(s) Signed: 02/10/2015 3:48:19 PM By: Lorine Bears RCP, RRT, CHT Entered By: Lorine Bears on 02/10/2015 13:00:44

## 2015-02-11 NOTE — Progress Notes (Signed)
CONSTANT, FREYRE ([id number]) Visit Report for 02/10/2015 HBO Details [name] Date of Service: 02/10/2015 1:30 PM Patient Name: J. Patient Account Number: [id number] Medical Record Treating RN: [id number] Number: Other Clinician: Jacqulyn [name] Date of Birth/Sex: December 26, 1965 (50 y.o. Male) Treating [name] Primary Care Physician/Extender: [name] Physician: Referring Physician: Sharilyn [name] in Treatment: 4 HBO Treatment Course Details Treatment Course Ordering Physician: [name] 1 Number: HBO Treatment Start Date: 02/01/2015 Total Treatments 30 Ordered: HBO Indication: Diabetic Ulcer(s) of the Lower Extremity HBO Treatment Details Treatment Number: 7 Patient Type: Outpatient Chamber Type: Monoplace Chamber Serial #: [id number] Treatment Protocol: 2.0 ATA with 90 minutes oxygen, and no air breaks Treatment Details Compression Rate Down: 1.5 psi / minute De-Compression Rate Up: 1.5 psi / minute Air breaks and breathing Compress Tx Pressure periods Decompress Decompress Begins Reached (leave unused spaces Begins Ends blank) Chamber Pressure (ATA) 1 2 - - - - - - 2 1 Clock Time (24 hr) 12:57 13:09 - - - - - - 14:40 14:50 Treatment Length: 113 (minutes) Treatment Segments: 4 Capillary Blood Glucose Pre Capillary Blood Glucose (mg/dl): Post Capillary Blood Glucose (mg/dl): Vital Signs Capillary Blood Glucose Reference Range: 80 - 120 mg / dl HBO Diabetic Blood Glucose Intervention Range: <131 mg/dl or >249 mg/dl Time Vitals Blood Respiratory Capillary Blood Glucose Pulse Action Type: Pulse: Temperature: Taken: Pressure: Rate: Glucose (mg/dl): Meter #: Oximetry (%) Taken: Pre 12:49 108/68 90 18 98.2 191 1 none Post 14:56 118/72 72 18 98.2 166 1 none [name], [name] J. ([id number]) Treatment Response Treatment Completion Status: Treatment Completed without Adverse Event Physician Notes No concerns with treatment given HBO  Attestation I certify that I supervised this HBO treatment in accordance with Medicare guidelines. A trained Yes emergency response team is readily available per hospital policies and procedures. Continue HBOT as ordered. Yes Electronic Signature(s) Signed: 02/10/2015 5:51:40 PM By: [name] Ham MD Previous Signature: 02/10/2015 3:48:19 PM Version By: [name], [name] Entered By: [name] on 02/10/2015 17:50:00 [name] ([id number]) -------------------------------------------------------------------------------- HBO Safety Checklist Details [name] Date of Service: 02/10/2015 1:30 PM Patient Name: J. Patient Account Number: [id number] Medical Record Treating RN: [id number] Number: Other Clinician: Jacqulyn [name] Date of Birth/Sex: [birth year] (50 y.o. Male) Treating [name] Primary Care Physician/Extender: [name] Physician: Referring Physician: Sharilyn [name] in Treatment: 4 HBO Safety Checklist Items Safety Checklist Consent Form Signed Patient voided / foley secured and emptied When did you last eato 11:00 am Last dose of injectable or oral agent 11:00 am NA Ostomy pouch emptied and vented if applicable NA All implantable devices assessed, documented and approved Intravenous access site secured and place Valuables secured Linens and cotton and cotton/polyester blend (less than 51% polyester) Personal oil-based products / skin lotions / body lotions removed Wigs or hairpieces removed Smoking or tobacco materials removed Books / newspapers / magazines / loose paper removed Cologne, aftershave, perfume and deodorant removed Jewelry removed (may wrap wedding band) Make-up removed Hair care products removed Battery operated devices (external) removed Heating patches and chemical warmers removed NA Titanium eyewear removed NA Nail polish cured greater than 10 hours NA Casting material cured  greater than 10 hours NA Hearing aids removed NA Loose dentures or partials removed NA Prosthetics have been removed Patient demonstrates correct use of air break device (if applicable) Patient concerns have been addressed Patient grounding bracelet on and cord attached to chamber Specifics for Inpatients (complete in addition to above) Medication sheet  sent with patient [name], [name] ([id number]) Intravenous medications needed or due during therapy sent with patient Drainage tubes (e.g. nasogastric tube or chest tube secured and vented) Endotracheal or Tracheotomy tube secured Cuff deflated of air and inflated with saline Airway suctioned Electronic Signature(s) Signed: 02/10/2015 3:48:19 PM By: [name] RCP, RRT, [name] Entered By: [name] on 02/10/2015 13:02:03

## 2015-02-12 ENCOUNTER — Encounter: Payer: Managed Care, Other (non HMO) | Admitting: Surgery

## 2015-02-12 DIAGNOSIS — E11621 Type 2 diabetes mellitus with foot ulcer: Secondary | ICD-10-CM | POA: Diagnosis not present

## 2015-02-12 LAB — GLUCOSE, CAPILLARY
Glucose-Capillary: 202 mg/dL — ABNORMAL HIGH (ref 65–99)
Glucose-Capillary: 166 mg/dL — ABNORMAL HIGH (ref 65–99)

## 2015-02-12 NOTE — Progress Notes (Signed)
LOUCAS, LINSCHEID ([id number]) Visit Report for 02/11/2015 HBO Details Patient Name: [name], [name]. Date of Service: 02/11/2015 1:30 PM Medical Record Number: [id number] Patient Account Number: [id number] Date of Birth/Sex: November 23, 1965 (50 y.o. Male) Treating RN: Primary Care Physician: [name] Other Clinician: [name] Referring Physician: [name] Treating Physician/Extender: Frann Rider in Treatment: 5 HBO Treatment Course Details Treatment Course Ordering Physician: Christin Fudge 1 Number: HBO Treatment Start Date: 02/01/2015 Total Treatments 30 Ordered: HBO Indication: Diabetic Ulcer(s) of the Lower Extremity HBO Treatment Details Treatment Number: 8 Patient Type: Outpatient Chamber Type: Monoplace Chamber Serial #: [id number] Treatment Protocol: 2.0 ATA with 90 minutes oxygen, and no air breaks Treatment Details Compression Rate Down: 1.5 psi / minute De-Compression Rate Up: 1.5 psi / minute Air breaks and breathing Compress Tx Pressure periods Decompress Decompress Begins Reached (leave unused spaces Begins Ends blank) Chamber Pressure (ATA) 1 2 - - - - - - 2 1 Clock Time (24 hr) 12:55 13:07 - - - - - - 14:37 14:47 Treatment Length: 112 (minutes) Treatment Segments: 4 Capillary Blood Glucose Pre Capillary Blood Glucose (mg/dl): Post Capillary Blood Glucose (mg/dl): Vital Signs Capillary Blood Glucose Reference Range: 80 - 120 mg / dl HBO Diabetic Blood Glucose Intervention Range: <131 mg/dl or >249 mg/dl Time Vitals Blood Respiratory Capillary Blood Glucose Pulse Action Type: Pulse: Temperature: Taken: Pressure: Rate: Glucose (mg/dl): Meter #: Oximetry (%) Taken: Pre 12:46 118/70 84 18 98.2 218 1 none Post 14:51 108/62 78 18 98.2 119 1 none Treatment Response Treatment Completion Status: Treatment Completed without Adverse Event [name], [name] ([id number]) HBO Attestation I certify that I supervised this HBO  treatment in accordance with Medicare guidelines. A trained Yes emergency response team is readily available per hospital policies and procedures. Continue HBOT as ordered. Yes Electronic Signature(s) Signed: 02/11/2015 3:48:06 PM By: Christin Fudge MD, FACS Entered By: Christin Fudge on 02/11/2015 15:48:06 [name] ([id number]) -------------------------------------------------------------------------------- HBO Safety Checklist Details Patient Name: [name] Date of Service: 02/11/2015 1:30 PM Medical Record Number: [id number] Patient Account Number: [id number] Date of Birth/Sex: 1965-01-15 (50 y.o. Male) Treating RN: Primary Care Physician: [name] Other Clinician: [name] Referring Physician: [name] Treating Physician/Extender: Frann Rider in Treatment: 5 HBO Safety Checklist Items Safety Checklist Consent Form Signed Patient voided / foley secured and emptied When did you last eato 11:00 am Last dose of injectable or oral agent 11:00 am NA Ostomy pouch emptied and vented if applicable NA All implantable devices assessed, documented and approved Intravenous access site secured and place Valuables secured Linens and cotton and cotton/polyester blend (less than 51% polyester) Personal oil-based products / skin lotions / body lotions removed Wigs or hairpieces removed Smoking or tobacco materials removed Books / newspapers / magazines / loose paper removed Cologne, aftershave, perfume and deodorant removed Jewelry removed (may wrap wedding band) Make-up removed Hair care products removed Battery operated devices (external) removed Heating patches and chemical warmers removed NA Titanium eyewear removed NA Nail polish cured greater than 10 hours NA Casting material cured greater than 10 hours NA Hearing aids removed NA Loose dentures or partials removed NA Prosthetics have been removed Patient demonstrates correct  use of air break device (if applicable) Patient concerns have been addressed Patient grounding bracelet on and cord attached to chamber Specifics for Inpatients (complete in addition to above) Medication sheet sent with patient Intravenous medications needed or due during therapy sent with patient NAWEED, DOLLARHIDE ([id number]) Drainage tubes (e.g. nasogastric  tube or chest tube secured and vented) Endotracheal or Tracheotomy tube secured Cuff deflated of air and inflated with saline Airway suctioned Electronic Signature(s) Signed: 02/11/2015 4:54:13 PM By: Lorine Bears RCP, RRT, CHT Entered By: Lorine Bears on 02/11/2015 12:59:16

## 2015-02-12 NOTE — Progress Notes (Signed)
FATEH, BEMISS ([id number]) Visit Report for 02/11/2015 Arrival Information Details Patient Name: [name], [name]. Date of Service: 02/11/2015 1:30 PM Medical Record Number: [id number] Patient Account Number: [id number] Date of Birth/Sex: [birth year] (50 y.o. Male) Treating RN: Primary Care Physician: [name] Other Clinician: [name] Referring Physician: [name] Treating Physician/Extender: Frann Rider in Treatment: 5 Visit Information History Since Last Visit Added or deleted any medications: No Patient Arrived: Ambulatory Any new allergies or adverse reactions: No Arrival Time: 12:45 Had a fall or experienced change in No Accompanied By: self activities of daily living that may affect Transfer Assistance: None risk of falls: Patient Identification Verified: Yes Signs or symptoms of abuse/neglect No Secondary Verification Process Yes since last visito Completed: Hospitalized since last visit: No Patient Requires Transmission-Based No Has Dressing in Place as Prescribed: Yes Precautions: Has Footwear/Offloading in Place as Yes Patient Has Alerts: Yes Prescribed: Left: Surgical Shoe with Pressure Relief Insole Pain Present Now: No Electronic Signature(s) Signed: 02/11/2015 4:54:13 PM By: Lorine Bears RCP, RRT, CHT Entered By: Lorine Bears on 02/11/2015 12:57:19 [name] ([id number]) -------------------------------------------------------------------------------- Encounter Discharge Information Details Patient Name: [name] Date of Service: 02/11/2015 1:30 PM Medical Record Number: [id number] Patient Account Number: [id number] Date of Birth/Sex: July 15, 1965 (50 y.o. Male) Treating RN: Primary Care Physician: [name] Other Clinician: [name] Referring Physician: [name] Treating Physician/Extender: Frann Rider in Treatment: 5 Encounter Discharge  Information Items Discharge Pain Level: 0 Discharge Condition: Stable Ambulatory Status: Ambulatory Discharge Destination: Home Private Transportation: Auto Accompanied By: self Schedule Follow-up Appointment: No Medication Reconciliation completed and No provided to Patient/Care Christia Domke: Clinical Summary of Care: Notes Patient has an HBO treatment scheduled on 02/12/15 at 13:30 pm. Electronic Signature(s) Signed: 02/11/2015 4:54:13 PM By: Lorine Bears RCP, RRT, CHT Entered By: Lorine Bears on 02/11/2015 15:08:46 [name] ([id number]) -------------------------------------------------------------------------------- Vitals Details Patient Name: [name] Date of Service: 02/11/2015 1:30 PM Medical Record Number: [id number] Patient Account Number: [id number] Date of Birth/Sex: June 06, 1965 (50 y.o. Male) Treating RN: Primary Care Physician: [name] Other Clinician: [name] Referring Physician: [name] Treating Physician/Extender: Frann Rider in Treatment: 5 Vital Signs Time Taken: 12:46 Temperature (F): 98.2 Height (in): 74 Pulse (bpm): 84 Weight (lbs): 288 Respiratory Rate (breaths/min): 18 Body Mass Index (BMI): 37 Blood Pressure (mmHg): 118/70 Capillary Blood Glucose (mg/dl): 218 Reference Range: 80 - 120 mg / dl Electronic Signature(s) Signed: 02/11/2015 4:54:13 PM By: Lorine Bears RCP, RRT, CHT Entered By: Becky Sax, Amado Nash on 02/11/2015 12:58:21

## 2015-02-13 NOTE — Progress Notes (Signed)
KHARTER, REXROTH ([id number]) Visit Report for 02/12/2015 Arrival Information Details KALEV, FALKE 02/12/2015 1:30 Patient Name: Date of Service: [name] Medical Record Patient Account Number: [id number] Number: Treating RN: Date of Birth/Sex: January 29, 1965 (50 y.o. Male) Other Clinician: [name] Primary Care Physician: [name] Treating Britto, Errol Referring Physician: [name] Physician/Extender: Suella Grove in Treatment: 5 Visit Information History Since Last Visit Added or deleted any medications: No Patient Arrived: Ambulatory Any new allergies or adverse reactions: No Arrival Time: 12:47 Had a fall or experienced change in No Accompanied By: self activities of daily living that may affect Transfer Assistance: None risk of falls: Patient Identification Verified: Yes Signs or symptoms of abuse/neglect No Secondary Verification Process Yes since last visito Completed: Hospitalized since last visit: No Patient Requires Transmission-Based No Has Dressing in Place as Prescribed: Yes Precautions: Has Footwear/Offloading in Place as Yes Patient Has Alerts: Yes Prescribed: Left: Surgical Shoe with Pressure Relief Insole Pain Present Now: No Electronic Signature(s) Signed: 02/12/2015 3:20:39 PM By: Lorine Bears RCP, RRT, CHT Entered By: Lorine Bears on 02/12/2015 13:11:43 [name] ([id number]) -------------------------------------------------------------------------------- Encounter Discharge Information Details ANGEL, DIAGNE 02/12/2015 1:30 Patient Name: Date of Service: [name] Medical Record Patient Account Number: [id number] Number: Treating RN: Date of Birth/Sex: June 06, 1965 (50 y.o. Male) Other Clinician: [name] Primary Care Physician: [name] Treating Christin Fudge Referring Physician: [name] Physician/Extender: Suella Grove in Treatment:  5 Encounter Discharge Information Items Discharge Pain Level: 0 Discharge Condition: Stable Ambulatory Status: Ambulatory Discharge Destination: Home Private Transportation: Auto Accompanied By: self Schedule Follow-up Appointment: No Medication Reconciliation completed and No provided to Patient/Care Alexsus Papadopoulos: Clinical Summary of Care: Notes Patient has an HBO treatment scheduled on 02/15/15 at 13:00 pm. Electronic Signature(s) Signed: 02/12/2015 3:20:39 PM By: Lorine Bears RCP, RRT, CHT Entered By: Lorine Bears on 02/12/2015 15:20:10 [name] ([id number]) -------------------------------------------------------------------------------- Vitals Details Lewis, Tuvia 02/12/2015 1:30 Patient Name: Date of Service: [name] Medical Record Patient Account Number: [id number] Number: Treating RN: Date of Birth/Sex: September 12, 1965 (50 y.o. Male) Other Clinician: [name] Primary Care Physician: [name] Treating Britto, Errol Referring Physician: [name] Physician/Extender: Suella Grove in Treatment: 5 Vital Signs Time Taken: 12:49 Temperature (F): 98.3 Height (in): 74 Pulse (bpm): 90 Weight (lbs): 288 Respiratory Rate (breaths/min): 18 Body Mass Index (BMI): 37 Blood Pressure (mmHg): 128/72 Capillary Blood Glucose (mg/dl): 202 Reference Range: 80 - 120 mg / dl Electronic Signature(s) Signed: 02/12/2015 3:20:39 PM By: Lorine Bears RCP, RRT, CHT Entered By: Lorine Bears on 02/12/2015 13:12:47

## 2015-02-13 NOTE — Progress Notes (Addendum)
DEONDREA, HARNISCH ([id number]) Visit Report for 02/12/2015 Arrival Information Details Patient Name: [name], [name]. Date of Service: 02/12/2015 3:30 PM Medical Record Number: [id number] Patient Account Number: [id number] Date of Birth/Sex: [birth year] (50 y.o. Male) Treating RN: Baruch Gouty, RN, BSN, Velva Harman Primary Care Physician: [name] Other Clinician: Referring Physician: [name] Treating Physician/Extender: Frann Rider in Treatment: 5 Visit Information History Since Last Visit Added or deleted any medications: No Patient Arrived: Ambulatory Any new allergies or adverse reactions: No Arrival Time: 14:52 Had a fall or experienced change in No Accompanied By: SELF activities of daily living that may affect Transfer Assistance: None risk of falls: Patient Identification Verified: Yes Signs or symptoms of abuse/neglect since last No Secondary Verification Process Yes visito Completed: Hospitalized since last visit: No Patient Requires Transmission-Based No Has Dressing in Place as Prescribed: Yes Precautions: Pain Present Now: No Patient Has Alerts: Yes Electronic Signature(s) Signed: 02/12/2015 3:32:51 PM By: Regan Lemming BSN, RN Entered By: Regan Lemming on 02/12/2015 14:53:14 [name] ([id number]) -------------------------------------------------------------------------------- Encounter Discharge Information Details Patient Name: [name] Date of Service: 02/12/2015 3:30 PM Medical Record Number: [id number] Patient Account Number: [id number] Date of Birth/Sex: April 17, 1965 (50 y.o. Male) Treating RN: Baruch Gouty, RN, BSN, Velva Harman Primary Care Physician: [name] Other Clinician: Referring Physician: [name] Treating Physician/Extender: Frann Rider in Treatment: 5 Encounter Discharge Information Items Discharge Pain Level: 0 Discharge Condition: Stable Ambulatory Status: Ambulatory Discharge Destination:  Home Transportation: Private Auto Accompanied By: self Schedule Follow-up Appointment: No Medication Reconciliation completed No and provided to Patient/Care Anavi Branscum: Patient Clinical Summary of Care: Declined Electronic Signature(s) Signed: 02/12/2015 3:53:46 PM By: Ruthine Dose Previous Signature: 02/12/2015 3:32:51 PM Version By: Regan Lemming BSN, RN Entered By: Ruthine Dose on 02/12/2015 15:53:46 [name] ([id number]) -------------------------------------------------------------------------------- Lower Extremity Assessment Details Patient Name: [name] Date of Service: 02/12/2015 3:30 PM Medical Record Number: [id number] Patient Account Number: [id number] Date of Birth/Sex: 04-09-1965 (50 y.o. Male) Treating RN: Baruch Gouty, RN, BSN, Velva Harman Primary Care Physician: [name] Other Clinician: Referring Physician: [name] Treating Physician/Extender: Frann Rider in Treatment: 5 Vascular Assessment Pulses: Posterior Tibial Dorsalis Pedis Palpable: [Right:Yes] Extremity colors, hair growth, and conditions: Extremity Color: [Right:Normal] Hair Growth on Extremity: [Right:Yes] Temperature of Extremity: [Right:Warm] Capillary Refill: [Right:< 3 seconds] Electronic Signature(s) Signed: 02/12/2015 3:32:51 PM By: Regan Lemming BSN, RN Entered By: Regan Lemming on 02/12/2015 14:57:51 [name] ([id number]) -------------------------------------------------------------------------------- Multi Wound Chart Details Patient Name: [name] Date of Service: 02/12/2015 3:30 PM Medical Record Number: [id number] Patient Account Number: [id number] Date of Birth/Sex: 03-19-1965 (50 y.o. Male) Treating RN: Baruch Gouty, RN, BSN, Velva Harman Primary Care Physician: [name] Other Clinician: Referring Physician: [name] Treating Physician/Extender: Frann Rider in Treatment: 5 Vital Signs Height(in): 74 Pulse(bpm):  84 Weight(lbs): 288 Blood Pressure 147/63 (mmHg): Body Mass Index(BMI): 37 Temperature(F): 98.1 Respiratory Rate 18 (breaths/min): Photos: [1:No Photos] [N/A:N/A] Wound Location: [1:Right Foot - Plantar] [N/A:N/A] Wounding Event: [1:Gradually Appeared] [N/A:N/A] Primary Etiology: [1:Diabetic Wound/Ulcer of the Lower Extremity] [N/A:N/A] Comorbid History: [1:Hypertension, Type II Diabetes, Neuropathy] [N/A:N/A] Date Acquired: [1:12/30/2014] [N/A:N/A] Weeks of Treatment: [1:5] [N/A:N/A] Wound Status: [1:Open] [N/A:N/A] Measurements L x W x D 1.5x1x0.2 [N/A:N/A] (cm) Area (cm) : [1:1.178] [N/A:N/A] Volume (cm) : [1:0.236] [N/A:N/A] % Reduction in Area: [1:-50.10%] [N/A:N/A] % Reduction in Volume: 0.00% [N/A:N/A] Classification: [1:Grade 3] [N/A:N/A] Wagner Verification: [1:Abscess] [N/A:N/A] Exudate Amount: [1:Large] [N/A:N/A] Exudate Type: [1:Serosanguineous] [N/A:N/A] Exudate Color: [1:red, brown] [N/A:N/A] Wound Margin: [1:Distinct, outline attached] [N/A:N/A] Granulation  Amount: [1:Medium (34-66%)] [N/A:N/A] Necrotic Amount: [1:Small (1-33%)] [N/A:N/A] Exposed Structures: [1:Fascia: No Fat: No Tendon: No Muscle: No Joint: No Bone: No] [N/A:N/A] Limited to Skin Breakdown Epithelialization: None N/A N/A Periwound Skin Texture: Callus: Yes N/A N/A Edema: No Excoriation: No Induration: No Crepitus: No Fluctuance: No Friable: No Rash: No Scarring: No Periwound Skin Moist: Yes N/A N/A Moisture: Maceration: No Dry/Scaly: No Periwound Skin Color: Atrophie Blanche: No N/A N/A Cyanosis: No Ecchymosis: No Erythema: No Hemosiderin Staining: No Mottled: No Pallor: No Rubor: No Temperature: No Abnormality N/A N/A Tenderness on No N/A N/A Palpation: Wound Preparation: Ulcer Cleansing: N/A N/A Rinsed/Irrigated with Saline Topical Anesthetic Applied: Other: Lidocaine 4% Treatment Notes Electronic Signature(s) Signed: 02/12/2015 3:32:51 PM By: Regan Lemming BSN,  RN Entered By: Regan Lemming on 02/12/2015 15:04:22 [name] ([id number]) -------------------------------------------------------------------------------- [place] Details Patient Name: [name], [name]. Date of Service: 02/12/2015 3:30 PM Medical Record Number: [id number] Patient Account Number: [id number] Date of Birth/Sex: June 09, 1965 (49 y.o. Male) Treating RN: Baruch Gouty, RN, BSN, Velva Harman Primary Care Physician: [name] Other Clinician: Referring Physician: [name] Treating Physician/Extender: Frann Rider in Treatment: 5 Active Inactive HBO Nursing Diagnoses: Anxiety related to feelings of confinement associated with the hyperbaric oxygen chamber Anxiety related to knowledge deficit of hyperbaric oxygen therapy and treatment procedures Discomfort related to temperature and humidity changes inside hyperbaric chamber Potential for barotraumas to ears, sinuses, teeth, and lungs or cerebral gas embolism related to changes in atmospheric pressure inside hyperbaric oxygen chamber Potential for oxygen toxicity seizures related to delivery of 100% oxygen at an increased atmospheric pressure Potential for pulmonary oxygen toxicity related to delivery of 100% oxygen at an increased atmospheric pressure Goals: Barotrauma will be prevented during HBO2 Date Initiated: 01/07/2015 Goal Status: Active Patient and/or family will be able to state/discuss factors appropriate to the management of their disease process during treatment Date Initiated: 01/07/2015 Goal Status: Active Patient will tolerate the hyperbaric oxygen therapy treatment Date Initiated: 01/07/2015 Goal Status: Active Patient will tolerate the internal climate of the chamber Date Initiated: 01/07/2015 Goal Status: Active Patient/caregiver will verbalize understanding of HBO goals, rationale, procedures and potential hazards Date Initiated: 01/07/2015 Goal Status: Active Signs and  symptoms of pulmonary oxygen toxicity will be recognized and promptly addressed Date Initiated: 01/07/2015 Goal Status: Active Signs and symptoms of seizure will be recognized and promptly addressed ; seizing patients will suffer no harm Date Initiated: 01/07/2015 [name] ([id number]) Goal Status: Active Interventions: Administer a five (5) minute air break for patient if signs and symptoms of seizure appear and notify the hyperbaric physician Administer a ten (10) minute air break for patient if signs and symptoms of seizure appear and notify the hyperbaric physician Administer decongestants, per physician orders, prior to HBO2 Administer the correct therapeutic gas delivery based on the patients needs and limitations, per physician order Assess and provide for patientos comfort related to the hyperbaric environment and equalization of middle ear Assess for signs and symptoms related to adverse events, including but not limited to confinement anxiety, pneumothorax, oxygen toxicity and baurotrauma Assess patient for any history of confinement anxiety Assess patient's knowledge and expectations regarding hyperbaric medicine and provide education related to the hyperbaric environment, goals of treatment and prevention of adverse events Implement protocols to decrease risk of pneumothorax in high risk patients Notes: Orientation to the Wound Care Program Nursing Diagnoses: Knowledge deficit related to the wound healing center program Goals: Patient/caregiver will verbalize understanding of the [place] Program Date  Initiated: 01/07/2015 Goal Status: Active Interventions: Provide education on orientation to the wound center Notes: Peripheral Neuropathy Nursing Diagnoses: Knowledge deficit related to disease process and management of peripheral neurovascular dysfunction Potential alteration in peripheral tissue perfusion (select prior to confirmation of  diagnosis) Goals: Patient/caregiver will verbalize understanding of disease process and disease management Date Initiated: 01/07/2015 Goal Status: Active [name], [name] ([id number]) Interventions: Assess signs and symptoms of neuropathy upon admission and as needed Provide education on Management of Neuropathy and Related Ulcers Provide education on Management of Neuropathy upon discharge from the [facility] for HBO Treatment Activities: Consult for HBO : 02/12/2015 Patient referred for customized footwear/offloading : 02/12/2015 Notes: Wound/Skin Impairment Nursing Diagnoses: Impaired tissue integrity Knowledge deficit related to ulceration/compromised skin integrity Goals: Patient/caregiver will verbalize understanding of skin care regimen Date Initiated: 01/07/2015 Goal Status: Active Ulcer/skin breakdown will have a volume reduction of 30% by week 4 Date Initiated: 01/07/2015 Goal Status: Active Ulcer/skin breakdown will have a volume reduction of 50% by week 8 Date Initiated: 01/07/2015 Goal Status: Active Ulcer/skin breakdown will have a volume reduction of 80% by week 12 Date Initiated: 01/07/2015 Goal Status: Active Ulcer/skin breakdown will heal within 14 weeks Date Initiated: 01/07/2015 Goal Status: Active Interventions: Assess patient/caregiver ability to obtain necessary supplies Assess patient/caregiver ability to perform ulcer/skin care regimen upon admission and as needed Assess ulceration(s) every visit Provide education on ulcer and skin care Treatment Activities: Referred to DME Denson Niccoli for dressing supplies : 02/12/2015 Skin care regimen initiated : 02/12/2015 [name], [name] ([id number]) Topical wound management initiated : 02/12/2015 Notes: Electronic Signature(s) Signed: 02/12/2015 3:32:51 PM By: Regan Lemming BSN, RN Entered By: Regan Lemming on 02/12/2015 15:03:39 [name]  ([id number]) -------------------------------------------------------------------------------- Pain Assessment Details Patient Name: [name] Date of Service: 02/12/2015 3:30 PM Medical Record Number: [id number] Patient Account Number: [id number] Date of Birth/Sex: 1965-10-02 (50 y.o. Male) Treating RN: Baruch Gouty, RN, BSN, Velva Harman Primary Care Physician: [name] Other Clinician: Referring Physician: [name] Treating Physician/Extender: Frann Rider in Treatment: 5 Active Problems Location of Pain Severity and Description of Pain Patient Has Paino No Site Locations Pain Management and Medication Current Pain Management: Electronic Signature(s) Signed: 02/12/2015 3:32:51 PM By: Regan Lemming BSN, RN Entered By: Regan Lemming on 02/12/2015 14:59:07 [name] ([id number]) -------------------------------------------------------------------------------- Patient/Caregiver Education Details Patient Name: [name] Date of Service: 02/12/2015 3:30 PM Medical Record Number: [id number] Patient Account Number: [id number] Date of Birth/Gender: 10-01-1965 (50 y.o. Male) Treating RN: Baruch Gouty, RN, BSN, Velva Harman Primary Care Physician: [name] Other Clinician: Referring Physician: [name] Treating Physician/Extender: Frann Rider in Treatment: 5 Education Assessment Education Provided To: Patient Education Topics Provided Peripheral Neuropathy: Methods: Explain/Verbal Responses: State content correctly Welcome To [facility]: Methods: Explain/Verbal Responses: State content correctly Wound/Skin Impairment: Methods: Explain/Verbal Responses: State content correctly Electronic Signature(s) Signed: 02/12/2015 3:32:51 PM By: Regan Lemming BSN, RN Entered By: Regan Lemming on 02/12/2015 15:07:37 [name] ([id number]) -------------------------------------------------------------------------------- Wound  Assessment Details Patient Name: [name] Date of Service: 02/12/2015 3:30 PM Medical Record Number: [id number] Patient Account Number: [id number] Date of Birth/Sex: 09-Feb-1965 (50 y.o. Male) Treating RN: Baruch Gouty, RN, BSN, Velva Harman Primary Care Physician: [name] Other Clinician: Referring Physician: [name] Treating Physician/Extender: Frann Rider in Treatment: 5 Wound Status Wound Number: 1 Primary Diabetic Wound/Ulcer of the Lower Etiology: Extremity Wound Location: Right Foot - Plantar Wound Status: Open Wounding Event: Gradually Appeared Comorbid Hypertension, Type II Diabetes, Date Acquired: 12/30/2014 History:  Neuropathy Weeks Of Treatment: 5 Clustered Wound: No Photos Photo Uploaded By: Regan Lemming on 02/12/2015 15:31:31 Wound Measurements Length: (cm) 1.5 Width: (cm) 1 Depth: (cm) 0.2 Area: (cm) 1.178 Volume: (cm) 0.236 % Reduction in Area: -50.1% % Reduction in Volume: 0% Epithelialization: None Tunneling: No Undermining: No Wound Description Classification: Grade 3 Wagner Verification: Abscess Wound Margin: Distinct, outline attached Exudate Amount: Large Exudate Type: Serosanguineous Exudate Color: red, brown Foul Odor After Cleansing: No Wound Bed Granulation Amount: Medium (34-66%) Exposed Structure Necrotic Amount: Small (1-33%) Fascia Exposed: No Necrotic Quality: Adherent Slough Fat Layer Exposed: No [name], [name] ([id number]) Tendon Exposed: No Muscle Exposed: No Joint Exposed: No Bone Exposed: No Limited to Skin Breakdown Periwound Skin Texture Texture Color No Abnormalities Noted: No No Abnormalities Noted: No Callus: Yes Atrophie Blanche: No Crepitus: No Cyanosis: No Excoriation: No Ecchymosis: No Fluctuance: No Erythema: No Friable: No Hemosiderin Staining: No Induration: No Mottled: No Localized Edema: No Pallor: No Rash: No Rubor: No Scarring: No Temperature / Pain Moisture  Temperature: No Abnormality No Abnormalities Noted: No Dry / Scaly: No Maceration: No Moist: Yes Wound Preparation Ulcer Cleansing: Rinsed/Irrigated with Saline Topical Anesthetic Applied: Other: Lidocaine 4%, Treatment Notes Wound #1 (Right, Plantar Foot) 1. Cleansed with: Clean wound with Normal Saline 4. Dressing Applied: Aquacel Ag 5. Secondary Dressing Applied Gauze and Kerlix/Conform 7. Secured with Tape Notes darco front Dietitian) Signed: 02/12/2015 3:32:51 PM By: Regan Lemming BSN, RN Entered By: Regan Lemming on 02/12/2015 14:58:53 [name] ([id number]) -------------------------------------------------------------------------------- Vitals Details Patient Name: [name] Date of Service: 02/12/2015 3:30 PM Medical Record Number: [id number] Patient Account Number: [id number] Date of Birth/Sex: [birth year] (50 y.o. Male) Treating RN: Baruch Gouty, RN, BSN, [place] Primary Care Physician: [name] Other Clinician: Referring Physician: [name] Treating Physician/Extender: Frann Rider in Treatment: 5 Vital Signs Time Taken: 14:59 Temperature (F): 98.1 Height (in): 74 Pulse (bpm): 84 Weight (lbs): 288 Respiratory Rate (breaths/min): 18 Body Mass Index (BMI): 37 Blood Pressure (mmHg): 147/63 Reference Range: 80 - 120 mg / dl Electronic Signature(s) Signed: 02/12/2015 3:32:51 PM By: Regan Lemming BSN, RN Entered By: Regan Lemming on 02/12/2015 14:59:33

## 2015-02-13 NOTE — Progress Notes (Signed)
GIEZI, BURBACH ([id number]) Visit Report for 02/12/2015 Chief Complaint Document Details [name] Date of Service: 02/12/2015 3:30 PM Patient Name: J. Patient Account Number: [id number] Medical Record Afful, RN, BSN, [id number] Treating RN: Number: Velva Harman Date of Birth/Sex: 1965-09-25 (50 y.o. Male) Other Clinician: Primary Care Physician: [name] Treating Christin Fudge Referring Physician: [name] Physician/Extender: Suella Grove in Treatment: 5 Information Obtained from: Patient Chief Complaint Patients presents for treatment of an open diabetic ulcer on the plantar aspect of the right foot which she's had for about 4 weeks Electronic Signature(s) Signed: 02/12/2015 3:11:34 PM By: Christin Fudge MD, FACS Entered By: Christin Fudge on 02/12/2015 15:11:33 [name] -------------------------------------------------------------------------------- Debridement Details [name] Date of Service: 02/12/2015 3:30 PM Patient Name: J. Patient Account Number: [id number] Medical Record Afful, RN, BSN, [id number] Treating RN: Number: Velva Harman Date of Birth/Sex: 05-Dec-1965 (50 y.o. Male) Other Clinician: Primary Care Physician: [name] Treating Weylin Plagge Referring Physician: [name] Physician/Extender: Weeks in Treatment: 5 Debridement Performed for Wound #1 Right,Plantar Foot Assessment: Performed By: Physician Christin Fudge, MD Debridement: Debridement Pre-procedure Yes Verification/Time Out Taken: Start Time: 15:04 Pain Control: Lidocaine 4% Topical Solution Level: Skin/Subcutaneous Tissue Total Area Debrided (L x 1.5 (cm) x 1 (cm) = 1.5 (cm) W): Tissue and other Fibrin/Slough, Subcutaneous material debrided: Instrument: Curette Bleeding: Minimum Hemostasis Achieved: Pressure End Time: 15:09 Procedural Pain: 2 Post Procedural Pain: 2 Response to Treatment: Procedure was tolerated well Post Debridement  Measurements of Total Wound Length: (cm) 1.5 Width: (cm) 1 Depth: (cm) 0.3 Volume: (cm) 0.353 Post Procedure Diagnosis Same as Pre-procedure Electronic Signature(s) Signed: 02/12/2015 3:11:28 PM By: Christin Fudge MD, FACS Signed: 02/12/2015 3:32:51 PM By: Regan Lemming BSN, RN Entered By: Christin Fudge on 02/12/2015 15:11:28 [name] -------------------------------------------------------------------------------- HPI Details [name] Date of Service: 02/12/2015 3:30 PM Patient Name: J. Patient Account Number: [id number] Medical Record Afful, RN, BSN, [id number] Treating RN: Number: Velva Harman Date of Birth/Sex: 11-21-1965 (50 y.o. Male) Other Clinician: Primary Care Physician: [name] Treating Christin Fudge Referring Physician: [name] Physician/Extender: Weeks in Treatment: 5 History of Present Illness Location: plantar aspect of the right forefoot Quality: Patient reports No Pain. Severity: Patient states wound are getting worse. Duration: Patient has had the wound for < 4 weeks prior to presenting for treatment Timing: he has minimal discharge from the wound Context: The wound appeared gradually over time Modifying Factors: Other treatment(s) tried include:plan local care but not offloading Associated Signs and Symptoms: Patient reports having:no pain or discharge from the wound. HPI Description: This 50 year old male comes with an ulcerated area on the plantar aspect of the right foot which she's had for approximately a month. I have known him from a previous visit at [facility] wound center and was treated in the months of April and May 2016 and rapidly healed a left plantar ulcer with a total contact cast. He has been a diabetic for about 16 years and tries to keep active and is fairly compliant with his diabetes management. 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 Past medical history significant for hypertension,  hyperlipidemia, and status post appendectomy 1993. He does not smoke or drink alcohol. 01/14/2015 -- the patient had tolerated his total contact cast very well and had no problems and has had no systemic symptoms. However when his total contact cast was cut open he had excessive amount of purulent drainage in spite of being on antibiotics. He had had a recent x-ray done in the ER 12/22/2014 which showed  IMPRESSION:No evidence of osseous erosion. Known soft tissue ulceration is not well characterized on radiograph. Scattered vascular calcifications seen. his last hemoglobin A1c in December was 7.3. He has been on Augmentin and doxycycline for the last 2 weeks. 01/21/2015 -- his culture grew rare growth of Pantoea species an MR moderate growth of Candida parapsilosis. it is sensitive to levofloxacin. He has not heard back from the insurance company regarding his hyperbaric oxygen therapy. His MRI has not been done yet and we will try and get him an earlier date 01/28/2015 -- MRI was done last night -- IMPRESSION:1. Soft tissue ulcer overlying the plantar aspect of the fifth metatarsal head extending to the cortex. Subcortical marrow edema in the fifth metatarsal head with corresponding T1 hypointensity is concerning for early osteomyelitis of the plantar lateral aspect of the fifth metatarsal head. Chest x-ray done on 01/14/2015 shows bronchiectatic changes without infiltrate. EKG done on generally 17 2017 shows a normal sinus rhythm and is a normal EKG. NEFTALY, CROUCHER ([id number]) 02/04/2015 -- he was asked to see Dr. [name] last week and had 2 appointments but had to cancel both due to pressures of work. Last night he has woken up with severe pain in the foot and leg and it is swollen up. No fever or no change in his blood glucose. Addendum: I spoke with Dr. [name] who kindly agreed to accept the patient for inpatient therapy and have also opened to the hospitalist Dr. [name],  and discuss details of the management including PICC line and repeat cultures. 12/12/2015-- -- was seen by Dr. [name] in the hospital and a PICC line was placed. He was to receive Ceftazidime 2 g every 12 hourly, oral levofloxacin 750 mg every 24 hourly and oral fluconazole 200 mg daily. The antibiotics were to be given for 4 weeks except the Diflucan was to be given for the first 2 weeks. Reviewed note from 02/10/2015 -- and Dr. [name] had recommended management for growth of MSSA and Serratia. He switched him from ceftazidime to ceftriaxone 2 g every 24 hours. Levofloxacin was stopped and he would continue on fluconazole for another week. He had asked me to decide whether further imaging was necessary and whether surgical debridement of the infected bone was needed. He is doing well and has been off work for this week and we will keep him off the next week. Electronic Signature(s) Signed: 02/12/2015 3:12:04 PM By: Christin Fudge MD, FACS Entered By: Christin Fudge on 02/12/2015 15:12:04 [name] -------------------------------------------------------------------------------- Physical Exam Details [name] Date of Service: 02/12/2015 3:30 PM Patient Name: J. Patient Account Number: [id number] Medical Record Afful, RN, BSN, [id number] Treating RN: Number: Velva Harman Date of Birth/Sex: 1965-03-01 (50 y.o. Male) Other Clinician: Primary Care Physician: [name] Treating Christin Fudge Referring Physician: [name] Physician/Extender: Weeks in Treatment: 5 Constitutional . Pulse regular. Respirations normal and unlabored. Afebrile. . Eyes Nonicteric. Reactive to light. Ears, Nose, Mouth, and Throat Lips, teeth, and gums WNL.Marland Kitchen Moist mucosa without lesions. Neck supple and nontender. No palpable supraclavicular or cervical adenopathy. Normal sized without goiter. Respiratory WNL. No retractions.. Cardiovascular Pedal Pulses WNL. No  clubbing, cyanosis or edema. Lymphatic No adneopathy. No adenopathy. No adenopathy. Musculoskeletal Adexa without tenderness or enlargement.. Digits and nails w/o clubbing, cyanosis, infection, petechiae, ischemia, or inflammatory conditions.. Integumentary (Hair, Skin) No suspicious lesions. No crepitus or fluctuance. No peri-wound warmth or erythema. No masses.Marland Kitchen Psychiatric Judgement and insight Intact.. No evidence of depression, anxiety, or agitation.. Notes the wound is  looking much better with no evidence of gross purulence or cellulitis. I have debrided some of his necrotic debris and it continues to probe down to bone. Electronic Signature(s) Signed: 02/12/2015 3:12:34 PM By: Christin Fudge MD, FACS Entered By: Christin Fudge on 02/12/2015 15:12:34 [name] ([id number]) -------------------------------------------------------------------------------- Physician Orders Details [name] Date of Service: 02/12/2015 3:30 PM Patient Name: J. Patient Account Number: [id number] Medical Record Afful, RN, BSN, [id number] Treating RN: Number: Velva Harman Date of Birth/Sex: [birth year] (50 y.o. Male) Other Clinician: Primary Care Physician: Fay Records Referring Physician: [name] Physician/Extender: Suella Grove in Treatment: 5 Verbal / Phone Orders: Yes Clinician: Afful, RN, BSN, Rita Read Back and Verified: Yes Diagnosis Coding Wound Cleansing Wound #1 Right,Plantar Foot o Cleanse wound with mild soap and water o May Shower, gently pat wound dry prior to applying new dressing. Anesthetic Wound #1 Right,Plantar Foot o Topical Lidocaine 4% cream applied to wound bed prior to debridement Primary Wound Dressing Wound #1 Right,Plantar Foot o Aquacel Ag Secondary Dressing Wound #1 Right,Plantar Foot o Gauze and Kerlix/Conform Dressing Change Frequency Wound #1 Right,Plantar Foot o Change dressing every day. Follow-up  Appointments Wound #1 Right,Plantar Foot o Return Appointment in 1 week. Off-Loading Wound #1 Right,Plantar Foot o Open toe surgical shoe with peg assist. - front offload Electronic Signature(s) Signed: 02/12/2015 3:32:51 PM By: Regan Lemming BSN, RN Signed: 02/12/2015 3:57:16 PM By: Christin Fudge MD, FACS HET, HELFERICH ([id number]) Entered By: Regan Lemming on 02/12/2015 15:07:01 [name] ([id number]) -------------------------------------------------------------------------------- Problem List Details [name] Date of Service: 02/12/2015 3:30 PM Patient Name: J. Patient Account Number: [id number] Medical Record Afful, RN, BSN, [id number] Treating RN: Number: Velva Harman Date of Birth/Sex: [birth year] (50 y.o. 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 Patient Account Number: [id number] Medical Record Afful, RN, BSN, [id number] Treating RN: Number: Velva Harman Date of Birth/Sex: [birth year] (50 y.o. Male) Other Clinician: Primary Care Physician: Paticia Stack, Fardeen Steinberger Referring Physician: [name] Physician/Extender: Weeks in Treatment: 5 Subjective Chief Complaint Information obtained from Patient Patients presents for treatment of an open diabetic ulcer on the plantar aspect of the right foot which she's had for about 4 weeks History of Present Illness (HPI) The following HPI elements were documented for the patient's wound: Location: plantar aspect of the right forefoot Quality: Patient reports No Pain. Severity: Patient states wound are getting worse. Duration: Patient has had the wound for < 4 weeks prior to presenting for treatment Timing: he has minimal discharge from the wound Context: The wound appeared gradually over time Modifying Factors: Other treatment(s) tried include:plan local care but not offloading Associated Signs and Symptoms: Patient reports having:no pain or discharge from the wound. This 50 year old male comes with an ulcerated area on the plantar aspect of the right foot which she's had for approximately a month. I have  known him from a previous visit at [facility] wound center and was treated in the months of April and May 2016 and rapidly healed a left plantar ulcer with a total contact cast. He has been a diabetic for about 16 years and tries to keep active and is fairly compliant with his diabetes management. 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 Past medical history significant for hypertension, hyperlipidemia, and status post appendectomy 1993. He does not smoke or drink alcohol. 01/14/2015 -- the patient had tolerated his total contact cast very well and had no problems and has had no systemic symptoms. However when his total contact cast was cut open he had excessive amount of purulent drainage in spite of being on antibiotics. He had had a recent x-ray done in the ER 12/22/2014 which showed IMPRESSION:No evidence of osseous erosion. Known soft tissue ulceration is not well  characterized on radiograph. Scattered vascular calcifications seen. his last hemoglobin A1c in December was 7.3. He has been on Augmentin and doxycycline for the last 2 weeks. 01/21/2015 -- his culture grew rare growth of Pantoea species an MR moderate growth of Candida parapsilosis. it is sensitive to levofloxacin. He has not heard back from the insurance company regarding his hyperbaric oxygen therapy. KHYDEN, DAVIN ([id number]) His MRI has not been done yet and we will try and get him an earlier date 01/28/2015 -- MRI was done last night -- IMPRESSION:1. Soft tissue ulcer overlying the plantar aspect of the fifth metatarsal head extending to the cortex. Subcortical marrow edema in the fifth metatarsal head with corresponding T1 hypointensity is concerning for early osteomyelitis of the plantar lateral aspect of the fifth metatarsal head. Chest x-ray done on 01/14/2015 shows bronchiectatic changes without infiltrate. EKG done on generally 17 2017 shows a normal sinus rhythm and is a normal EKG. 02/04/2015 -- he was asked to see Dr. [name] last week and had 2 appointments but had to cancel both due to pressures of work. Last night he has woken up with severe pain in the foot and leg and it is swollen up. No fever or no change in his blood glucose. Addendum: I spoke with Dr. [name] who kindly agreed to accept the patient for inpatient therapy and have also opened to the hospitalist Dr. [name], and discuss details of the management including PICC line and repeat cultures. 12/12/2015-- -- was seen by Dr. [name] in the hospital and a PICC line was placed. He was to receive Ceftazidime 2 g every 12 hourly, oral levofloxacin 750 mg every 24 hourly and oral fluconazole 200 mg daily. The antibiotics were to be given for 4 weeks except the Diflucan was to be given for the first 2 weeks. Reviewed note from 02/10/2015 -- and Dr. [name] had recommended management for growth of  MSSA and Serratia. He switched him from ceftazidime to ceftriaxone 2 g every 24 hours. Levofloxacin was stopped and he would continue on fluconazole for another week. He had asked me to decide whether further imaging was necessary and whether surgical debridement of the infected bone was needed. He is doing well and has been off work for this week and we will keep him off the next week. Objective Constitutional Pulse regular. Respirations normal and unlabored. Afebrile. Vitals Time Taken: 2:59 PM, Height: 74 in, Weight: 288 lbs, BMI: 37, Temperature: 98.1 F, Pulse: 84 bpm, Respiratory Rate: 18 breaths/min, Blood Pressure: 147/63 mmHg. Eyes Nonicteric. Reactive to light. Ears, Nose, Mouth, and Throat Lips, teeth, and gums WNL.Marland Kitchen Moist mucosa  without lesions. Neck supple and nontender. No palpable supraclavicular or cervical adenopathy. Normal sized without goiter. Respiratory CRISTHOFER, BRIGHAM ([id number]) WNL. No retractions.. Cardiovascular Pedal Pulses WNL. No clubbing, cyanosis or edema. Lymphatic No adneopathy. No adenopathy. No adenopathy. Musculoskeletal Adexa without tenderness or enlargement.. Digits and nails w/o clubbing, cyanosis, infection, petechiae, ischemia, or inflammatory conditions.Marland Kitchen Psychiatric Judgement and insight Intact.. No evidence of depression, anxiety, or agitation.. General Notes: the wound is looking much better with no evidence of gross purulence or cellulitis. I have debrided some of his necrotic debris and it continues to probe down to bone. Integumentary (Hair, Skin) No suspicious lesions. No crepitus or fluctuance. No peri-wound warmth or erythema. No masses.. Wound #1 status is Open. Original cause of wound was Gradually Appeared. The wound is located on the [place]. The wound measures 1.5cm length x 1cm width x 0.2cm depth; 1.178cm^2 area and 0.236cm^3 volume. The wound is limited to skin breakdown. There is no tunneling or  undermining noted. There is a large amount of serosanguineous drainage noted. The wound margin is distinct with the outline attached to the wound base. There is medium (34-66%) granulation within the wound bed. There is a small (1-33%) amount of necrotic tissue within the wound bed including Adherent Slough. The periwound skin appearance exhibited: Callus, Moist. The periwound skin appearance did not exhibit: Crepitus, Excoriation, Fluctuance, Friable, Induration, Localized Edema, Rash, Scarring, Dry/Scaly, Maceration, Atrophie Blanche, Cyanosis, Ecchymosis, Hemosiderin Staining, Mottled, Pallor, Rubor, Erythema. Periwound temperature was noted as No Abnormality. Assessment Active Problems ICD-10 E11.621 - Type 2 diabetes mellitus with foot ulcer L97.512 - 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d L84 - Corns and callosities L02.611 - Cutaneous abscess of right foot M86.371 - Chronic multifocal osteomyelitis, right ankle and foot MOSHEH, EYMARD. ([id number]) Overall there is much improvement since the last time I saw him and his hospital admission, PICC line and IV antibiotics have helped immensely. We will continue with conservative care and at this stage I have not recommended any surgical debridement. Antibiotics as per Dr. [name]. We will use Aquacel Ag, felt and his Darco offloading shoe and dressing will be changed every other day. The hyperbaric oxygen therapy has been of immense help and we will continue this with careful monitoring of his wound. Procedures Wound #1 Wound #1 is a Diabetic Wound/Ulcer of the Lower Extremity located on the [place] . There was a Skin/Subcutaneous Tissue Debridement [id number]) debridement with total area of 1.5 sq cm performed by Christin Fudge, MD. with the following instrument(s): Curette including Fibrin/Slough and Subcutaneous after achieving pain control using Lidocaine 4% Topical  Solution. A time out was conducted prior to the start of the procedure. A Minimum amount of bleeding was controlled with Pressure. The procedure was tolerated well with a pain level of 2 throughout and a pain level of 2 following the procedure. Post Debridement Measurements: 1.5cm length x 1cm width x 0.3cm depth; 0.353cm^3 volume. Post procedure Diagnosis Wound #1: Same as Pre-Procedure Plan Wound Cleansing: Wound #1 Right,Plantar Foot: Cleanse wound with mild soap and water May Shower, gently pat wound dry prior to applying new dressing. Anesthetic: Wound #1 Right,Plantar Foot: Topical Lidocaine 4% cream applied to wound bed prior to debridement Primary Wound Dressing: Wound #1 Right,Plantar Foot: Aquacel Ag Secondary Dressing: Wound #1 Right,Plantar Foot: Gauze and Kerlix/Conform Dressing Change Frequency: Wound #1 Right,Plantar Foot: Change dressing every day. Follow-up Appointments: Wound #1 Right,Plantar Foot: ZAEL, KOECK ([id number]) Return Appointment  in 1 week. Off-Loading: Wound #1 Right,Plantar Foot: Open toe surgical shoe with peg assist. - front offload Overall there is much improvement since the last time I saw him and his hospital admission, PICC line and IV antibiotics have helped immensely. We will continue with conservative care and at this stage I have not recommended any surgical debridement. Antibiotics as per Dr. [name]. We will use Aquacel Ag, felt and his Darco offloading shoe and dressing will be changed every other day. The hyperbaric oxygen therapy has been of immense help and we will continue this with careful monitoring of his wound. Electronic Signature(s) Signed: 02/12/2015 3:13:59 PM By: Christin Fudge MD, FACS Entered By: Christin Fudge on 02/12/2015 15:13:59 [name] -------------------------------------------------------------------------------- Francisca December Date of Service:  02/12/2015 Patient Name: J. Patient Account Number: [id number] Medical Record Afful, RN, BSN, [id number] Treating RN: Number: Velva Harman Date of Birth/Sex: Sep 29, 1965 (50 y.o. Male) Other Clinician: Primary Care Physician: Paticia Stack, Esau Fridman Referring Physician: [name] Physician/Extender: Weeks in Treatment: 5 Diagnosis Coding ICD-10 Codes Code Description E11.621 Type 2 diabetes mellitus with foot ulcer L97.512 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d L84 Corns and callosities L02.611 Cutaneous abscess of right foot M86.371 Chronic multifocal osteomyelitis, right ankle and foot Facility Procedures CPT4 Code Description: [id number] - DEB SUBQ TISSUE 20 SQ CM/< ICD-10 Description Diagnosis E11.621 Type 2 diabetes mellitus with foot ulcer L97.512 Non-pressure chronic ulcer of other part of right fo M86.371 Chronic multifocal osteomyelitis, right  ankle and fo Modifier: ot with fat lay ot Quantity: 1 er exposed Physician Procedures CPT4 Code Description: [id number] - WC PHYS LEVEL 3 - EST PT ICD-10 Description Diagnosis E11.621 Type 2 diabetes mellitus with foot ulcer L97.512 Non-pressure chronic ulcer of other part of right fo L84 Corns and callosities L02.611 Cutaneous abscess  of right foot Modifier: 25 ot with fat laye Quantity: 1 r exposed CPT4 Code Description: [id number] - WC PHYS SUBQ TISS 20 SQ CM ICD-10 Description Diagnosis E11.621 Type 2 diabetes mellitus with foot ulcer L97.512 Non-pressure chronic ulcer of other part of right fo DEMITRE, BORTS ([id number]) Modifier: ot with fat laye Quantity: 1 r exposed Electronic Signature(s) Signed: 02/12/2015 3:14:21 PM By: Christin Fudge MD, FACS Entered By: Christin Fudge on 02/12/2015 15:14:21

## 2015-02-13 NOTE — Progress Notes (Signed)
[name], [name] ([id number]) Visit Report for 02/12/2015 HBO Details TAI, HENNESS 02/12/2015 1:30 Patient Name: Date of Service: [name] Medical Record Patient Account Number: [id number] Number: Treating RN: Date of Birth/Sex: Jun 01, 1965 (50 y.o. Male) Other Clinician: [name] Primary Care Physician: [name] Treating Britto, Errol Referring Physician: [name] Physician/Extender: Suella Grove in Treatment: 5 HBO Treatment Course Details Treatment Course Ordering Physician: Christin Fudge 1 Number: HBO Treatment Start Date: 02/01/2015 Total Treatments 30 Ordered: HBO Indication: Diabetic Ulcer(s) of the Lower Extremity HBO Treatment Details Treatment Number: 9 Patient Type: Outpatient Chamber Type: Monoplace Chamber Serial #: [id number] Treatment Protocol: 2.0 ATA with 90 minutes oxygen, and no air breaks Treatment Details Compression Rate Down: 1.5 psi / minute De-Compression Rate Up: 1.5 psi / minute Air breaks and breathing Compress Tx Pressure periods Decompress Decompress Begins Reached (leave unused spaces Begins Ends blank) Chamber Pressure (ATA) 1 2 - - - - - - 2 1 Clock Time (24 hr) 12:57 13:09 - - - - - - 14:39 14:49 Treatment Length: 112 (minutes) Treatment Segments: 4 Capillary Blood Glucose Pre Capillary Blood Glucose (mg/dl): Post Capillary Blood Glucose (mg/dl): Vital Signs Capillary Blood Glucose Reference Range: 80 - 120 mg / dl HBO Diabetic Blood Glucose Intervention Range: <131 mg/dl or >249 mg/dl Time Vitals Blood Respiratory Capillary Blood Glucose Pulse Action Type: Pulse: Temperature: Taken: Pressure: Rate: Glucose (mg/dl): Meter #: Oximetry (%) Taken: Pre 12:49 128/72 90 18 98.3 202 1 none Post 15:00 147/63 84 18 98.1 166 1 none [name], [name] J. ([id number]) Treatment Response Treatment Completion Status: Treatment Completed without Adverse Event HBO Attestation I certify that I supervised this HBO  treatment in accordance with Medicare guidelines. A trained Yes emergency response team is readily available per hospital policies and procedures. Continue HBOT as ordered. Yes Electronic Signature(s) Signed: 02/12/2015 3:31:02 PM By: Christin Fudge MD, FACS Previous Signature: 02/12/2015 3:20:39 PM Version By: Lorine Bears RCP, RRT, CHT Entered By: Christin Fudge on 02/12/2015 15:31:01 [name], [name] ([id number]) -------------------------------------------------------------------------------- HBO Safety Checklist Details [name], [name] 02/12/2015 1:30 Patient Name: Date of Service: [name] Medical Record Patient Account Number: [id number] Number: Treating RN: Date of Birth/Sex: 03-Aug-1965 (50 y.o. Male) Other Clinician: [name] Primary Care Physician: [name] Treating Britto, Errol Referring Physician: [name] Physician/Extender: Suella Grove in Treatment: 5 HBO Safety Checklist Items Safety Checklist Consent Form Signed Patient voided / foley secured and emptied When did you last eato 11:00 am Last dose of injectable or oral agent 11:00 am NA Ostomy pouch emptied and vented if applicable NA All implantable devices assessed, documented and approved Intravenous access site secured and place Valuables secured Linens and cotton and cotton/polyester blend (less than 51% polyester) Personal oil-based products / skin lotions / body lotions removed Wigs or hairpieces removed Smoking or tobacco materials removed Books / newspapers / magazines / loose paper removed Cologne, aftershave, perfume and deodorant removed Jewelry removed (may wrap wedding band) Make-up removed Hair care products removed Battery operated devices (external) removed Heating patches and chemical warmers removed NA Titanium eyewear removed NA Nail polish cured greater than 10 hours NA Casting material cured greater than 10 hours NA Hearing aids  removed NA Loose dentures or partials removed NA Prosthetics have been removed Patient demonstrates correct use of air break device (if applicable) Patient concerns have been addressed Patient grounding bracelet on and cord attached to chamber Specifics for Inpatients (complete in addition to above) Medication sheet sent with patient DUVALL, GLISAN ([id number]) Intravenous  medications needed or due during therapy sent with patient Drainage tubes (e.g. nasogastric tube or chest tube secured and vented) Endotracheal or Tracheotomy tube secured Cuff deflated of air and inflated with saline Airway suctioned Electronic Signature(s) Signed: 02/12/2015 3:20:39 PM By: Lorine Bears RCP, RRT, CHT Entered By: Lorine Bears on 02/12/2015 13:13:30

## 2015-02-15 ENCOUNTER — Encounter: Payer: Managed Care, Other (non HMO) | Admitting: Surgery

## 2015-02-15 DIAGNOSIS — E11621 Type 2 diabetes mellitus with foot ulcer: Secondary | ICD-10-CM | POA: Diagnosis not present

## 2015-02-15 LAB — GLUCOSE, CAPILLARY
Glucose-Capillary: 239 mg/dL — ABNORMAL HIGH (ref 65–99)
Glucose-Capillary: 222 mg/dL — ABNORMAL HIGH (ref 65–99)

## 2015-02-16 ENCOUNTER — Encounter: Payer: Managed Care, Other (non HMO) | Admitting: Internal Medicine

## 2015-02-16 DIAGNOSIS — E11621 Type 2 diabetes mellitus with foot ulcer: Secondary | ICD-10-CM | POA: Diagnosis not present

## 2015-02-16 LAB — GLUCOSE, CAPILLARY
Glucose-Capillary: 182 mg/dL — ABNORMAL HIGH (ref 65–99)
Glucose-Capillary: 120 mg/dL — ABNORMAL HIGH (ref 65–99)

## 2015-02-16 NOTE — Progress Notes (Signed)
[name], [name] ([id number]) Visit Report for 02/15/2015 HBO Details [name], [name] 02/15/2015 1:30 Patient Name: Date of Service: [name] Medical Record Patient Account Number: [id number] Number: Treating RN: Date of Birth/Sex: [birth year] (50 y.o. Male) Other Clinician: Jacqulyn [name] Primary Care Physician: [name] Treating [name], [name] Referring Physician: [name] Physician/Extender: [name] in Treatment: 5 HBO Treatment Course Details Treatment Course Ordering Physician: [name] 1 Number: HBO Treatment Start Date: 02/01/2015 Total Treatments 30 Ordered: HBO Indication: Diabetic Ulcer(s) of the Lower Extremity HBO Treatment Details Treatment Number: 10 Patient Type: Outpatient Chamber Type: Monoplace Chamber Serial #: [id number] Treatment Protocol: 2.0 ATA with 90 minutes oxygen, and no air breaks Treatment Details Compression Rate Down: 1.5 psi / minute De-Compression Rate Up: 1.5 psi / minute Air breaks and breathing Compress Tx Pressure periods Decompress Decompress Begins Reached (leave unused spaces Begins Ends blank) Chamber Pressure (ATA) 1 2 - - - - - - 2 1 Clock Time (24 hr) 12:56 13:08 - - - - - - 14:38 14:48 Treatment Length: 112 (minutes) Treatment Segments: 4 Capillary Blood Glucose Pre Capillary Blood Glucose (mg/dl): Post Capillary Blood Glucose (mg/dl): Vital Signs Capillary Blood Glucose Reference Range: 80 - 120 mg / dl HBO Diabetic Blood Glucose Intervention Range: <131 mg/dl or >249 mg/dl Time Vitals Blood Respiratory Capillary Blood Glucose Pulse Action Type: Pulse: Temperature: Taken: Pressure: Rate: Glucose (mg/dl): Meter #: Oximetry (%) Taken: Pre 12:47 114/70 96 18 98.2 239 Post 14:52 112/60 84 18 98.7 222 1 none [name], [name] J. ([id number]) Treatment Response Treatment Completion Status: Treatment Completed without Adverse Event HBO Attestation I certify that I supervised this HBO  treatment in accordance with Medicare guidelines. A trained Yes emergency response team is readily available per hospital policies and procedures. Continue HBOT as ordered. Yes Electronic Signature(s) Signed: 02/15/2015 4:22:49 [name] By: [name] Fudge MD, FACS Entered By: [name] on 02/15/2015 16:22:49 [name], [name] ([id number]) -------------------------------------------------------------------------------- HBO Safety Checklist Details [name], [name] 02/15/2015 1:30 Patient Name: Date of Service: [name] Medical Record Patient Account Number: [id number] Number: Treating RN: Date of Birth/Sex: Mar 26, 1965 (50 y.o. Male) Other Clinician: Jacqulyn [name] Primary Care Physician: [name] Treating [name], [name] Referring Physician: [name] Physician/Extender: [name] in Treatment: 5 HBO Safety Checklist Items Safety Checklist Consent Form Signed Patient voided / foley secured and emptied When did you last eato 12:00 [name] Last dose of injectable or oral agent 12:00 [name] NA Ostomy pouch emptied and vented if applicable NA All implantable devices assessed, documented and approved Intravenous access site secured and place Valuables secured Linens and cotton and cotton/polyester blend (less than 51% polyester) Personal oil-based products / skin lotions / body lotions removed Wigs or hairpieces removed Smoking or tobacco materials removed Books / newspapers / magazines / loose paper removed Cologne, aftershave, perfume and deodorant removed Jewelry removed (may wrap wedding band) Make-up removed Hair care products removed Battery operated devices (external) removed Heating patches and chemical warmers removed NA Titanium eyewear removed NA Nail polish cured greater than 10 hours NA Casting material cured greater than 10 hours NA Hearing aids removed NA Loose dentures or partials removed NA Prosthetics have been removed Patient demonstrates  correct use of air break device (if applicable) Patient concerns have been addressed Patient grounding bracelet on and cord attached to chamber Specifics for Inpatients (complete in addition to above) Medication sheet sent with patient [name], [name] ([id number]) Intravenous medications needed or due during therapy sent with patient Drainage tubes (e.g. nasogastric tube or  chest tube secured and vented) Endotracheal or Tracheotomy tube secured Cuff deflated of air and inflated with saline Airway suctioned Electronic Signature(s) Signed: 02/15/2015 5:41:23 [name] By: [name] RCP, RRT, CHT Entered By: [name] on 02/15/2015 12:58:16

## 2015-02-16 NOTE — Progress Notes (Signed)
RODDERICK, ORSAK ([id number]) Visit Report for 02/15/2015 Arrival Information Details KHYLON, EBERLING 02/15/2015 1:30 Patient Name: Date of Service: [name] Medical Record Patient Account Number: [id number] Number: Treating RN: Date of Birth/Sex: [birth year] (50 y.o. Male) Other Clinician: [name] Primary Care Physician: [name] Treating Britto, Errol Referring Physician: [name] Physician/Extender: Suella Grove in Treatment: 5 Visit Information History Since Last Visit Added or deleted any medications: No Patient Arrived: Ambulatory Any new allergies or adverse reactions: No Arrival Time: 12:45 Had a fall or experienced change in No Accompanied By: self activities of daily living that may affect Transfer Assistance: None risk of falls: Patient Identification Verified: Yes Signs or symptoms of abuse/neglect No Secondary Verification Process Yes since last visito Completed: Hospitalized since last visit: No Patient Requires Transmission-Based No Has Dressing in Place as Prescribed: Yes Precautions: Has Footwear/Offloading in Place as Yes Patient Has Alerts: Yes Prescribed: Left: Surgical Shoe with Pressure Relief Insole Pain Present Now: No Electronic Signature(s) Signed: 02/15/2015 5:41:23 PM By: Lorine Bears RCP, RRT, CHT Entered By: Lorine Bears on 02/15/2015 12:53:13 [name] -------------------------------------------------------------------------------- Encounter Discharge Information Details HESHAM, WOMMACK 02/15/2015 1:30 Patient Name: Date of Service: [name] Medical Record Patient Account Number: [id number] Number: Treating RN: Date of Birth/Sex: 31-Oct-1965 (50 y.o. Male) Other Clinician: [name] Primary Care Physician: [name] Treating Christin Fudge Referring Physician: [name] Physician/Extender: Suella Grove in Treatment:  5 Encounter Discharge Information Items Discharge Pain Level: 0 Discharge Condition: Stable Ambulatory Status: Ambulatory Discharge Destination: Home Private Transportation: Auto Accompanied By: self Schedule Follow-up Appointment: No Medication Reconciliation completed and No provided to Patient/Care Anamika Kueker: Clinical Summary of Care: Notes Patient as an HBO treatment scheduled on 02/16/15 at 13:00 pm. Electronic Signature(s) Signed: 02/15/2015 5:41:23 PM By: Lorine Bears RCP, RRT, CHT Entered By: Lorine Bears on 02/15/2015 15:05:23 [name] -------------------------------------------------------------------------------- Vitals Details Theroux, Birch 02/15/2015 1:30 Patient Name: Date of Service: [name] Medical Record Patient Account Number: [id number] Number: Treating RN: Date of Birth/Sex: 1965/01/16 (50 y.o. Male) Other Clinician: [name] Primary Care Physician: [name] Treating Britto, Errol Referring Physician: [name] Physician/Extender: Suella Grove in Treatment: 5 Vital Signs Time Taken: 12:47 Temperature (F): 98.2 Height (in): 74 Pulse (bpm): 96 Weight (lbs): 288 Respiratory Rate (breaths/min): 18 Body Mass Index (BMI): 37 Blood Pressure (mmHg): 114/70 Capillary Blood Glucose (mg/dl): 239 Reference Range: 80 - 120 mg / dl Electronic Signature(s) Signed: 02/15/2015 5:41:23 PM By: Lorine Bears RCP, RRT, CHT Entered By: Lorine Bears on 02/15/2015 12:57:12

## 2015-02-17 ENCOUNTER — Encounter: Payer: Managed Care, Other (non HMO) | Admitting: Internal Medicine

## 2015-02-17 DIAGNOSIS — E11621 Type 2 diabetes mellitus with foot ulcer: Secondary | ICD-10-CM | POA: Diagnosis not present

## 2015-02-17 LAB — GLUCOSE, CAPILLARY
Glucose-Capillary: 253 mg/dL — ABNORMAL HIGH (ref 65–99)
Glucose-Capillary: 200 mg/dL — ABNORMAL HIGH (ref 65–99)

## 2015-02-17 NOTE — Progress Notes (Signed)
[name] ([id number]) Visit Report for 02/16/2015 Arrival Information Details [name] Date of Service: 02/16/2015 1:30 PM Patient Name: J. Patient Account Number: [id number] Medical Record Treating RN: [id number] Number: Other Clinician: Jacqulyn [name] Date of Birth/Sex: [birth year] (50 y.o. Male) Treating [name] Primary Care Physician/Extender: [name] Physician: Referring Physician: Sharilyn [name] in Treatment: 5 Visit Information History Since Last Visit Added or deleted any medications: No Patient Arrived: Ambulatory Any new allergies or adverse reactions: No Arrival Time: 12:50 Had a fall or experienced change in No Accompanied By: self activities of daily living that may affect Transfer Assistance: None risk of falls: Patient Identification Verified: Yes Signs or symptoms of abuse/neglect No Secondary Verification Process Yes since last visito Completed: Hospitalized since last visit: No Patient Requires Transmission-Based No Has Dressing in Place as Prescribed: Yes Precautions: Has Footwear/Offloading in Place as Yes Patient Has Alerts: Yes Prescribed: Left: Surgical Shoe with Pressure Relief Insole Pain Present Now: No Electronic Signature(s) Signed: 02/16/2015 3:42:54 PM By: [name] RCP, RRT, CHT Entered By: Becky Sax, Amado Nash on 02/16/2015 13:08:06 [name] ([id number]) -------------------------------------------------------------------------------- Encounter Discharge Information Details [name] Date of Service: 02/16/2015 1:30 PM Patient Name: J. Patient Account Number: [id number] Medical Record Treating RN: [id number] Number: Other Clinician: Jacqulyn [name] Date of Birth/Sex: January 10, 1965 (50 y.o. Male) Treating [name] Primary Care Physician/Extender: [name] Physician: Referring Physician: Sharilyn [name] in Treatment:  5 Encounter Discharge Information Items Discharge Pain Level: 0 Discharge Condition: Stable Ambulatory Status: Ambulatory Discharge Destination: Home Private Transportation: Auto Accompanied By: self Schedule Follow-up Appointment: No Medication Reconciliation completed and No provided to Patient/Care [name]: Clinical Summary of Care: Notes Patient has an HBO treatment scheduled on 02/17/15 at 13:00 pm. Electronic Signature(s) Signed: 02/16/2015 3:42:54 PM By: [name] RCP, RRT, CHT Entered By: Becky Sax, Amado Nash on 02/16/2015 15:42:22 [name] ([id number]) -------------------------------------------------------------------------------- Vitals Details [name] Date of Service: 02/16/2015 1:30 PM Patient Name: J. Patient Account Number: [id number] Medical Record Treating RN: [id number] Number: Other Clinician: Jacqulyn [name] Date of Birth/Sex: [birth year] (50 y.o. Male) Treating [name] Primary Care Physician/Extender: [name] Physician: Referring Physician: Sharilyn [name] in Treatment: 5 Vital Signs Time Taken: 12:54 Temperature (F): 98.0 Height (in): 74 Pulse (bpm): 84 Weight (lbs): 288 Respiratory Rate (breaths/min): 18 Body Mass Index (BMI): 37 Blood Pressure (mmHg): 122/66 Capillary Blood Glucose (mg/dl): 182 Reference Range: 80 - 120 mg / dl Electronic Signature(s) Signed: 02/16/2015 3:42:54 PM By: [name] RCP, RRT, CHT Entered By: [name] on 02/16/2015 13:16:47

## 2015-02-18 ENCOUNTER — Encounter (HOSPITAL_BASED_OUTPATIENT_CLINIC_OR_DEPARTMENT_OTHER): Payer: Managed Care, Other (non HMO) | Admitting: General Surgery

## 2015-02-18 ENCOUNTER — Encounter: Payer: Self-pay | Admitting: General Surgery

## 2015-02-18 DIAGNOSIS — E11621 Type 2 diabetes mellitus with foot ulcer: Secondary | ICD-10-CM | POA: Diagnosis not present

## 2015-02-18 DIAGNOSIS — M86171 Other acute osteomyelitis, right ankle and foot: Secondary | ICD-10-CM | POA: Diagnosis not present

## 2015-02-18 LAB — GLUCOSE, CAPILLARY
Glucose-Capillary: 177 mg/dL — ABNORMAL HIGH (ref 65–99)
Glucose-Capillary: 122 mg/dL — ABNORMAL HIGH (ref 65–99)

## 2015-02-18 NOTE — Progress Notes (Signed)
PHENIX, SUNSHINE ([id number]) Visit Report for 02/17/2015 Arrival Information Details [name] Date of Service: 02/17/2015 1:30 PM Patient Name: J. Patient Account Number: [id number] Medical Record Treating RN: [id number] Number: Other Clinician: [name] Date of Birth/Sex: 07-Oct-1965 (50 y.o. Male) Treating [name], [place] Primary Care Physician/Extender: Floyde Parkins Physician: Referring Physician: Sharilyn Sites in Treatment: 5 Visit Information History Since Last Visit Added or deleted any medications: No Patient Arrived: Ambulatory Any new allergies or adverse reactions: No Arrival Time: 12:50 Had a fall or experienced change in No Accompanied By: self activities of daily living that may affect Transfer Assistance: None risk of falls: Patient Identification Verified: Yes Signs or symptoms of abuse/neglect No Secondary Verification Process Yes since last visito Completed: Hospitalized since last visit: No Patient Requires Transmission-Based No Has Dressing in Place as Prescribed: Yes Precautions: Has Footwear/Offloading in Place as Yes Patient Has Alerts: Yes Prescribed: Left: Surgical Shoe with Pressure Relief Insole Pain Present Now: No Electronic Signature(s) Signed: 02/17/2015 4:37:04 PM By: Lorine Bears RCP, RRT, CHT Entered By: Becky Sax, Amado Nash on 02/17/2015 13:03:18 [name] ([id number]) -------------------------------------------------------------------------------- Encounter Discharge Information Details [name] Date of Service: 02/17/2015 1:30 PM Patient Name: J. Patient Account Number: [id number] Medical Record Treating RN: [id number] Number: Other Clinician: [name] Date of Birth/Sex: [birth year] (50 y.o. Male) Treating [name], [name] Primary Care Physician/Extender: Floyde Parkins Physician: Referring Physician: Sharilyn Sites in Treatment:  5 Encounter Discharge Information Items Discharge Pain Level: 0 Discharge Condition: Stable Ambulatory Status: Ambulatory Discharge Destination: Home Private Transportation: Auto Accompanied By: self Schedule Follow-up Appointment: No Medication Reconciliation completed and No provided to Patient/Care Breleigh Carpino: Clinical Summary of Care: Notes Patient has an HBO treatment scheduled on 2.16.17 at 13:00 pm. Electronic Signature(s) Signed: 02/17/2015 4:37:04 PM By: Lorine Bears RCP, RRT, CHT Entered By: Becky Sax, Amado Nash on 02/17/2015 15:03:14 [name] ([id number]) -------------------------------------------------------------------------------- Vitals Details [name] Date of Service: 02/17/2015 1:30 PM Patient Name: J. Patient Account Number: [id number] Medical Record Treating RN: [id number] Number: Other Clinician: [name] Date of Birth/Sex: [birth year] (50 y.o. Male) Treating [name], [name] Primary Care Physician/Extender: Floyde Parkins Physician: Referring Physician: Sharilyn Sites in Treatment: 5 Vital Signs Time Taken: 12:50 Temperature (F): 98.2 Height (in): 74 Pulse (bpm): 84 Weight (lbs): 288 Respiratory Rate (breaths/min): 18 Body Mass Index (BMI): 37 Blood Pressure (mmHg): 114/74 Capillary Blood Glucose (mg/dl): 253 Reference Range: 80 - 120 mg / dl Electronic Signature(s) Signed: 02/17/2015 4:37:04 PM By: Lorine Bears RCP, RRT, CHT Entered By: Becky Sax, Amado Nash on 02/17/2015 13:03:51

## 2015-02-18 NOTE — Progress Notes (Signed)
JOHNATTAN, CRASK ([id number]) Visit Report for 02/16/2015 HBO Details [name] Date of Service: 02/16/2015 1:30 PM Patient Name: J. Patient Account Number: [id number] Medical Record Treating RN: [id number] Number: Other Clinician: [name] Date of Birth/Sex: 10/31/1965 (50 y.o. Male) Treating [name], [name] Primary Care Physician/Extender: Floyde Parkins Physician: Referring Physician: Sharilyn Sites in Treatment: 5 HBO Treatment Course Details Treatment Course Ordering Physician: Christin Fudge 1 Number: HBO Treatment Start Date: 02/01/2015 Total Treatments 30 Ordered: HBO Indication: Diabetic Ulcer(s) of the Lower Extremity HBO Treatment Details Treatment Number: 11 Patient Type: Outpatient Chamber Type: Monoplace Chamber Serial #: [id number] Treatment Protocol: 2.0 ATA with 90 minutes oxygen, and no air breaks Treatment Details Compression Rate Down: 1.5 psi / minute De-Compression Rate Up: 1.5 psi / minute Air breaks and breathing Compress Tx Pressure periods Decompress Decompress Begins Reached (leave unused spaces Begins Ends blank) Chamber Pressure (ATA) 1 2 - - - - - - 2 1 Clock Time (24 hr) 13:05 13:17 - - - - - - 14:48 14:58 Treatment Length: 113 (minutes) Treatment Segments: 4 Capillary Blood Glucose Pre Capillary Blood Glucose (mg/dl): Post Capillary Blood Glucose (mg/dl): Vital Signs Capillary Blood Glucose Reference Range: 80 - 120 mg / dl HBO Diabetic Blood Glucose Intervention Range: <131 mg/dl or >249 mg/dl Time Vitals Blood Respiratory Capillary Blood Glucose Pulse Action Type: Pulse: Temperature: Taken: Pressure: Rate: Glucose (mg/dl): Meter #: Oximetry (%) Taken: Pre 12:54 122/66 84 18 98 182 1 none Post 15:02 106/60 84 18 98.4 120 1 none Franssen, Travius J. ([id number]) Treatment Response Treatment Completion Status: Treatment Completed without Adverse Event Physician Notes No concerns with treatment  given HBO Attestation I certify that I supervised this HBO treatment in accordance with Medicare guidelines. A trained Yes emergency response team is readily available per hospital policies and procedures. Continue HBOT as ordered. Yes Electronic Signature(s) Signed: 02/17/2015 5:04:49 PM By: Linton Ham MD Previous Signature: 02/16/2015 3:42:54 PM Version By: Becky Sax, Sallie RCP, RRT, CHT Entered By: Linton Ham on 02/16/2015 15:45:07 [name] -------------------------------------------------------------------------------- HBO Safety Checklist Details [name] Date of Service: 02/16/2015 1:30 PM Patient Name: J. Patient Account Number: [id number] Medical Record Treating RN: [id number] Number: Other Clinician: [name] Date of Birth/Sex: Jan 11, 1965 (50 y.o. Male) Treating [name], [name] Primary Care Physician/Extender: Floyde Parkins Physician: Referring Physician: Sharilyn Sites in Treatment: 5 HBO Safety Checklist Items Safety Checklist Consent Form Signed Patient voided / foley secured and emptied When did you last eato 10:30 am Last dose of injectable or oral agent 10:30 am NA Ostomy pouch emptied and vented if applicable NA All implantable devices assessed, documented and approved Intravenous access site secured and place Valuables secured Linens and cotton and cotton/polyester blend (less than 51% polyester) Personal oil-based products / skin lotions / body lotions removed Wigs or hairpieces removed Smoking or tobacco materials removed Books / newspapers / magazines / loose paper removed Cologne, aftershave, perfume and deodorant removed Jewelry removed (may wrap wedding band) Make-up removed Hair care products removed Battery operated devices (external) removed Heating patches and chemical warmers removed NA Titanium eyewear removed NA Nail polish cured greater than 10 hours NA Casting  material cured greater than 10 hours NA Hearing aids removed NA Loose dentures or partials removed NA Prosthetics have been removed Patient demonstrates correct use of air break device (if applicable) Patient concerns have been addressed Patient grounding bracelet on and cord attached to chamber Specifics for Inpatients (complete in addition to above) Medication sheet  sent with patient MELROSE, SANDGREN ([id number]) Intravenous medications needed or due during therapy sent with patient Drainage tubes (e.g. nasogastric tube or chest tube secured and vented) Endotracheal or Tracheotomy tube secured Cuff deflated of air and inflated with saline Airway suctioned Electronic Signature(s) Signed: 02/16/2015 3:42:54 PM By: Lorine Bears RCP, RRT, CHT Entered By: Lorine Bears on 02/16/2015 13:18:55

## 2015-02-18 NOTE — Progress Notes (Signed)
KANOA, LAMKE ([id number]) Visit Report for 02/17/2015 HBO Details [name] Date of Service: 02/17/2015 1:30 PM Patient Name: J. Patient Account Number: [id number] Medical Record Treating RN: [id number] Number: Other Clinician: [name] Date of Birth/Sex: 03-02-1965 (50 y.o. Male) Treating [name], [name] Primary Care Physician/Extender: Floyde Parkins Physician: Referring Physician: Sharilyn Sites in Treatment: 5 HBO Treatment Course Details Treatment Course Ordering Physician: Christin Fudge 1 Number: HBO Treatment Start Date: 02/01/2015 Total Treatments 30 Ordered: HBO Indication: Diabetic Ulcer(s) of the Lower Extremity HBO Treatment Details Treatment Number: 12 Patient Type: Outpatient Chamber Type: Monoplace Chamber Serial #: [id number] Treatment Protocol: 2.0 ATA with 90 minutes oxygen, and no air breaks Treatment Details Compression Rate Down: 1.5 psi / minute De-Compression Rate Up: 1.5 psi / minute Air breaks and breathing Compress Tx Pressure periods Decompress Decompress Begins Reached (leave unused spaces Begins Ends blank) Chamber Pressure (ATA) 1 2 - - - - - - 2 1 Clock Time (24 hr) 12:59 13:11 - - - - - - 14:41 14:52 Treatment Length: 113 (minutes) Treatment Segments: 4 Capillary Blood Glucose Pre Capillary Blood Glucose (mg/dl): Post Capillary Blood Glucose (mg/dl): Vital Signs Capillary Blood Glucose Reference Range: 80 - 120 mg / dl HBO Diabetic Blood Glucose Intervention Range: <131 mg/dl or >249 mg/dl Time Vitals Blood Respiratory Capillary Blood Glucose Pulse Action Type: Pulse: Temperature: Taken: Pressure: Rate: Glucose (mg/dl): Meter #: Oximetry (%) Taken: Pre 12:50 114/74 84 18 98.2 253 1 none Post 14:57 118/62 72 18 98.3 200 1 NONE Dudgeon, TINO VERHALEN. ([id number]) Treatment Response Treatment Completion Status: Treatment Completed without Adverse Event Physician Notes No concerns with treatment  given HBO Attestation I certify that I supervised this HBO treatment in accordance with Medicare guidelines. A trained Yes emergency response team is readily available per hospital policies and procedures. Continue HBOT as ordered. Yes Electronic Signature(s) Signed: 02/17/2015 5:04:49 PM By: Linton Ham MD Previous Signature: 02/17/2015 4:37:04 PM Version By: Becky Sax, Sallie RCP, RRT, CHT Entered By: Linton Ham on 02/17/2015 17:02:18 [name] ([id number]) -------------------------------------------------------------------------------- HBO Safety Checklist Details [name] Date of Service: 02/17/2015 1:30 PM Patient Name: J. Patient Account Number: [id number] Medical Record Treating RN: [id number] Number: Other Clinician: [name] Date of Birth/Sex: April 11, 1965 (50 y.o. Male) Treating [name], [name] Primary Care Physician/Extender: Floyde Parkins Physician: Referring Physician: Sharilyn Sites in Treatment: 5 HBO Safety Checklist Items Safety Checklist Consent Form Signed Patient voided / foley secured and emptied When did you last eato 11:45 am Last dose of injectable or oral agent 11:45 am NA Ostomy pouch emptied and vented if applicable NA All implantable devices assessed, documented and approved Intravenous access site secured and place PICC Line Valuables secured Linens and cotton and cotton/polyester blend (less than 51% polyester) Personal oil-based products / skin lotions / body lotions removed Wigs or hairpieces removed Smoking or tobacco materials removed Books / newspapers / magazines / loose paper removed Cologne, aftershave, perfume and deodorant removed Jewelry removed (may wrap wedding band) Make-up removed Hair care products removed Battery operated devices (external) removed Heating patches and chemical warmers removed NA Titanium eyewear removed NA Nail polish cured greater than 10 hours NA  Casting material cured greater than 10 hours NA Hearing aids removed NA Loose dentures or partials removed NA Prosthetics have been removed Patient demonstrates correct use of air break device (if applicable) Patient concerns have been addressed Patient grounding bracelet on and cord attached to chamber Specifics for Inpatients (complete in addition to above)  Medication sheet sent with patient IFEANYI, [place] ([id number]) Intravenous medications needed or due during therapy sent with patient Drainage tubes (e.g. nasogastric tube or chest tube secured and vented) Endotracheal or Tracheotomy tube secured Cuff deflated of air and inflated with saline Airway suctioned Electronic Signature(s) Signed: 02/17/2015 4:37:04 PM By: Lorine Bears RCP, RRT, CHT Entered By: Lorine Bears on 02/17/2015 13:04:39

## 2015-02-18 NOTE — Progress Notes (Signed)
seeiheal 

## 2015-02-19 ENCOUNTER — Encounter (HOSPITAL_BASED_OUTPATIENT_CLINIC_OR_DEPARTMENT_OTHER): Payer: Managed Care, Other (non HMO) | Admitting: General Surgery

## 2015-02-19 ENCOUNTER — Encounter: Payer: Managed Care, Other (non HMO) | Admitting: General Surgery

## 2015-02-19 DIAGNOSIS — M869 Osteomyelitis, unspecified: Secondary | ICD-10-CM

## 2015-02-19 DIAGNOSIS — E1169 Type 2 diabetes mellitus with other specified complication: Principal | ICD-10-CM

## 2015-02-19 DIAGNOSIS — E11621 Type 2 diabetes mellitus with foot ulcer: Secondary | ICD-10-CM

## 2015-02-19 DIAGNOSIS — L97509 Non-pressure chronic ulcer of other part of unspecified foot with unspecified severity: Secondary | ICD-10-CM | POA: Diagnosis not present

## 2015-02-19 LAB — GLUCOSE, CAPILLARY
Glucose-Capillary: 133 mg/dL — ABNORMAL HIGH (ref 65–99)
Glucose-Capillary: 187 mg/dL — ABNORMAL HIGH (ref 65–99)

## 2015-02-19 NOTE — Progress Notes (Signed)
[name], [name] ([id number]) Visit Report for 02/18/2015 Arrival Information Details [name], [name] 02/18/2015 1:30 Patient Name: Date of Service: [name] Medical Record Patient Account Number: [id number] Number: Treating RN: Date of Birth/Sex: [birth year] (50 y.o. Male) Other Clinician: [name] Primary Care Physician: [name] Treating Britto, Errol Referring Physician: [name] Physician/Extender: Suella Grove in Treatment: 6 Visit Information History Since Last Visit Added or deleted any medications: No Patient Arrived: Ambulatory Any new allergies or adverse reactions: No Arrival Time: 12:45 Had a fall or experienced change in No Accompanied By: self activities of daily living that may affect Transfer Assistance: None risk of falls: Patient Identification Verified: Yes Signs or symptoms of abuse/neglect No Secondary Verification Process Yes since last visito Completed: Hospitalized since last visit: No Patient Requires Transmission-Based No Has Dressing in Place as Prescribed: Yes Precautions: Has Footwear/Offloading in Place as Yes Patient Has Alerts: Yes Prescribed: Left: Surgical Shoe with Pressure Relief Insole Pain Present Now: No Electronic Signature(s) Signed: 02/18/2015 3:55:50 PM By: Lorine Bears RCP, RRT, CHT Entered By: Lorine Bears on 02/18/2015 12:56:50 [name] ([id number]) -------------------------------------------------------------------------------- Encounter Discharge Information Details [name], [name] 02/18/2015 1:30 Patient Name: Date of Service: [name] Medical Record Patient Account Number: [id number] Number: Treating RN: Date of Birth/Sex: April 06, 1965 (50 y.o. Male) Other Clinician: [name] Primary Care Physician: Bryson Ha, PETER Referring Physician: [name] Physician/Extender: Suella Grove in Treatment:  6 Encounter Discharge Information Items Discharge Pain Level: 0 Discharge Condition: Stable Ambulatory Status: Ambulatory Discharge Destination: Home Private Transportation: Auto Accompanied By: self Schedule Follow-up Appointment: No Medication Reconciliation completed and No provided to Patient/Care [name]: Clinical Summary of Care: Notes Patient has an HBO treatment scheduled on 02/19/15 at 13:00 pm. Electronic Signature(s) Signed: 02/19/2015 8:22:20 AM By: Judene Companion MD Entered By: Judene Companion on 02/18/2015 15:54:19 [name] ([id number]) -------------------------------------------------------------------------------- Patient/Caregiver Education Details [name], [name] 02/18/2015 1:30 Patient Name: Date of Service: [name] Medical Record Patient Account Number: [id number] Number: Treating RN: Date of Birth/Gender: [birth year] (50 y.o. Male) Other Clinician: [name] Primary Care Physician: Elizabeth Palau Referring Physician: [name] Physician/Extender: Suella Grove in Treatment: 6 Education Assessment Education Provided To: Patient Education Topics Provided Electronic Signature(s) Signed: 02/19/2015 8:22:20 AM By: Judene Companion MD Entered By: Judene Companion on 02/18/2015 15:54:11 [name] ([id number]) -------------------------------------------------------------------------------- Vitals Details [name], [name] 02/18/2015 1:30 Patient Name: Date of Service: [name] Medical Record Patient Account Number: [id number] Number: Treating RN: Date of Birth/Sex: 03-01-1965 (50 y.o. Male) Other Clinician: [name] Primary Care Physician: [name] Treating Britto, Errol Referring Physician: [name] Physician/Extender: Suella Grove in Treatment: 6 Vital Signs Time Taken: 12:46 Temperature (F): 98.1 Height (in): 74 Pulse (bpm): 78 Weight (lbs): 288 Respiratory Rate  (breaths/min): 18 Body Mass Index (BMI): 37 Blood Pressure (mmHg): 132/66 Capillary Blood Glucose (mg/dl): 177 Reference Range: 80 - 120 mg / dl Electronic Signature(s) Signed: 02/18/2015 3:55:50 PM By: Lorine Bears RCP, RRT, CHT Entered By: Lorine Bears on 02/18/2015 12:57:44

## 2015-02-19 NOTE — Progress Notes (Signed)
seeiheal 

## 2015-02-19 NOTE — Progress Notes (Signed)
[name], [name] ([id number]) Visit Report for 02/18/2015 Physician Orders Details [name], [name] 02/18/2015 1:30 Patient Name: Date of Service: [name] Medical Record Patient Account Number: [id number] Number: Treating RN: Date of Birth/Sex: 12-24-1965 (50 y.o. 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 Medical Record Patient Account Number: [id number] Number: Treating RN: Date of Birth/Sex: 06-16-1965 (50 y.o. 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 Chronic multifocal osteomyelitis, right ankle and foot Facility Procedures CPT4 Code: [id number] Description: (Facility Use Only) HBOT, full body chamber, 69min Modifier: Quantity: 4 Physician Procedures CPT4 Code Description: [id number] - WC PHYS HYPERBARIC OXYGEN THERAPY ICD-10 Description Diagnosis 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

## 2015-02-22 ENCOUNTER — Encounter: Payer: Managed Care, Other (non HMO) | Admitting: Surgery

## 2015-02-22 DIAGNOSIS — E11621 Type 2 diabetes mellitus with foot ulcer: Secondary | ICD-10-CM | POA: Diagnosis not present

## 2015-02-22 LAB — GLUCOSE, CAPILLARY
Glucose-Capillary: 166 mg/dL — ABNORMAL HIGH (ref 65–99)
Glucose-Capillary: 200 mg/dL — ABNORMAL HIGH (ref 65–99)
Glucose-Capillary: 150 mg/dL — ABNORMAL HIGH (ref 65–99)

## 2015-02-22 NOTE — Progress Notes (Signed)
[name], [name] ([id number]) Visit Report for 02/18/2015 HBO Details [name], [name] 02/18/2015 1:30 Patient Name: Date of Service: [name] Medical Record Patient Account Number: [id number] Number: Treating RN: Date of Birth/Sex: 07-Feb-1965 (50 y.o. Male) Other Clinician: Jacqulyn [name] Primary Care Physician: [name] Treating [name] Referring Physician: [name] Physician/Extender: [name] in Treatment: 6 HBO Treatment Course Details Treatment Course Ordering Physician: [name] 1 Number: HBO Treatment Start Date: 02/01/2015 Total Treatments 30 Ordered: HBO Indication: Diabetic Ulcer(s) of the Lower Extremity HBO Treatment Details Treatment Number: 13 Patient Type: Outpatient Chamber Type: Monoplace Chamber Serial #: [id number] Treatment Protocol: 2.0 ATA with 90 minutes oxygen, and no air breaks Treatment Details Compression Rate Down: 1.5 psi / minute De-Compression Rate Up: 1.5 psi / minute Air breaks and breathing Compress Tx Pressure periods Decompress Decompress Begins Reached (leave unused spaces Begins Ends blank) Chamber Pressure (ATA) 1 2 - - - - - - 2 1 Clock Time (24 hr) 12:55 13:07 - - - - - - 14:37 14:47 Treatment Length: 112 (minutes) Treatment Segments: 4 Capillary Blood Glucose Pre Capillary Blood Glucose (mg/dl): Post Capillary Blood Glucose (mg/dl): Vital Signs Capillary Blood Glucose Reference Range: 80 - 120 mg / dl HBO Diabetic Blood Glucose Intervention Range: <131 mg/dl or >249 mg/dl Time Vitals Blood Respiratory Capillary Blood Glucose Pulse Action Type: Pulse: Temperature: Taken: Pressure: Rate: Glucose (mg/dl): Meter #: Oximetry (%) Taken: Pre 12:46 132/66 78 18 98.1 177 1 none Post 14:51 122/68 78 18 98.1 122 1 none Sykora, Tiant J. ([id number]) Treatment Response Treatment Completion Status: Treatment Completed without Adverse Event HBO Attestation I certify that I supervised this HBO  treatment in accordance with Medicare guidelines. A trained Yes emergency response team is readily available per hospital policies and procedures. Continue HBOT as ordered. Yes Electronic Signature(s) Signed: 02/19/2015 8:22:20 AM By: [name] Companion MD Signed: 02/22/2015 2:58:24 PM By: [name] Fudge MD, FACS Entered By: [name] on 02/18/2015 15:53:26 [name], [name] ([id number]) -------------------------------------------------------------------------------- HBO Safety Checklist Details [name], [name] 02/18/2015 1:30 Patient Name: Date of Service: [name] Medical Record Patient Account Number: [id number] Number: Treating RN: Date of Birth/Sex: 08-30-1965 (50 y.o. Male) Other Clinician: Jacqulyn [name] Primary Care Physician: [name] Treating [name] Referring Physician: [name] Physician/Extender: [name] in Treatment: 6 HBO Safety Checklist Items Safety Checklist Consent Form Signed Patient voided / foley secured and emptied When did you last eato 10:00 am Last dose of injectable or oral agent 10:00 am NA Ostomy pouch emptied and vented if applicable NA All implantable devices assessed, documented and approved Intravenous access site secured and place Valuables secured Linens and cotton and cotton/polyester blend (less than 51% polyester) Personal oil-based products / skin lotions / body lotions removed Wigs or hairpieces removed Smoking or tobacco materials removed Books / newspapers / magazines / loose paper removed Cologne, aftershave, perfume and deodorant removed Jewelry removed (may wrap wedding band) Make-up removed Hair care products removed Battery operated devices (external) removed Heating patches and chemical warmers removed NA Titanium eyewear removed NA Nail polish cured greater than 10 hours NA Casting material cured greater than 10 hours NA Hearing aids removed NA Loose dentures or partials removed NA  Prosthetics have been removed Patient demonstrates correct use of air break device (if applicable) Patient concerns have been addressed Patient grounding bracelet on and cord attached to chamber Specifics for Inpatients (complete in addition to above) Medication sheet sent with patient [name], [name] ([id number]) Intravenous medications needed or due during  therapy sent with patient Drainage tubes (e.g. nasogastric tube or chest tube secured and vented) Endotracheal or Tracheotomy tube secured Cuff deflated of air and inflated with saline Airway suctioned Electronic Signature(s) Signed: 02/19/2015 8:22:20 AM By: [name] Companion MD Entered By: [name] on 02/18/2015 15:53:10

## 2015-02-23 ENCOUNTER — Other Ambulatory Visit
Admission: RE | Admit: 2015-02-23 | Discharge: 2015-02-23 | Disposition: A | Discharge status: Home / Self Care | Payer: Managed Care, Other (non HMO) | Source: Ambulatory Visit | Attending: Surgery | Admitting: Surgery

## 2015-02-23 ENCOUNTER — Encounter: Payer: Managed Care, Other (non HMO) | Admitting: Internal Medicine

## 2015-02-23 DIAGNOSIS — E11621 Type 2 diabetes mellitus with foot ulcer: Secondary | ICD-10-CM | POA: Diagnosis not present

## 2015-02-23 DIAGNOSIS — E13621 Other specified diabetes mellitus with foot ulcer: Secondary | ICD-10-CM | POA: Diagnosis not present

## 2015-02-23 LAB — GLUCOSE, CAPILLARY: Glucose-Capillary: 154 mg/dL — ABNORMAL HIGH (ref 65–99)

## 2015-02-23 NOTE — Progress Notes (Signed)
CARLETON, SCHNIPKE ([id number]) Visit Report for 02/22/2015 Arrival Information Details Patient Name: [name], [name]. Date of Service: 02/22/2015 12:45 PM Medical Record Number: [id number] Patient Account Number: [id number] Date of Birth/Sex: 1965-05-16 (50 y.o. Male) Treating RN: Montey Hora Primary Care Physician: [name] Other Clinician: Referring Physician: [name] Treating Physician/Extender: Frann Rider in Treatment: 6 Visit Information History Since Last Visit Added or deleted any medications: No Patient Arrived: Ambulatory Any new allergies or adverse reactions: No Arrival Time: 12:48 Had a fall or experienced change in No Accompanied By: self activities of daily living that may affect Transfer Assistance: None risk of falls: Patient Identification Verified: Yes Signs or symptoms of abuse/neglect since last No Secondary Verification Process Yes visito Completed: Hospitalized since last visit: No Patient Requires Transmission-Based No Pain Present Now: No Precautions: Patient Has Alerts: Yes Electronic Signature(s) Signed: 02/22/2015 5:40:21 PM By: Montey Hora Entered By: Montey Hora on 02/22/2015 12:52:46 [name] ([id number]) -------------------------------------------------------------------------------- Clinic Level of Care Assessment Details Patient Name: [name] Date of Service: 02/22/2015 12:45 PM Medical Record Number: [id number] Patient Account Number: [id number] Date of Birth/Sex: 04-07-1965 (50 y.o. Male) Treating RN: Montey Hora Primary Care Physician: [name] Other Clinician: Referring Physician: [name] Treating Physician/Extender: Frann Rider in Treatment: 6 Clinic Level of Care Assessment Items TOOL 4 Quantity Score []  - Use when only an EandM is performed on FOLLOW-UP visit 0 ASSESSMENTS - Nursing Assessment / Reassessment X - Reassessment of  Co-morbidities (includes updates in patient status) 1 10 X - Reassessment of Adherence to Treatment Plan 1 5 ASSESSMENTS - Wound and Skin Assessment / Reassessment []  - Simple Wound Assessment / Reassessment - one wound 0 X - Complex Wound Assessment / Reassessment - multiple wounds 2 5 []  - Dermatologic / Skin Assessment (not related to wound area) 0 ASSESSMENTS - Focused Assessment []  - Circumferential Edema Measurements - multi extremities 0 []  - Nutritional Assessment / Counseling / Intervention 0 X - Lower Extremity Assessment (monofilament, tuning fork, pulses) 1 5 []  - Peripheral Arterial Disease Assessment (using hand held doppler) 0 ASSESSMENTS - Ostomy and/or Continence Assessment and Care []  - Incontinence Assessment and Management 0 []  - Ostomy Care Assessment and Management (repouching, etc.) 0 PROCESS - Coordination of Care X - Simple Patient / Family Education for ongoing care 1 15 []  - Complex (extensive) Patient / Family Education for ongoing care 0 []  - Staff obtains Programmer, systems, Records, Test Results / Process Orders 0 []  - Staff telephones HHA, Nursing Homes / Clarify orders / etc 0 []  - Routine Transfer to another Facility (non-emergent condition) 0 [facility] ([id number]) []  - Routine Hospital Admission (non-emergent condition) 0 []  - New Admissions / Biomedical engineer / Ordering NPWT, Apligraf, etc. 0 []  - Emergency Hospital Admission (emergent condition) 0 X - Simple Discharge Coordination 1 10 []  - Complex (extensive) Discharge Coordination 0 PROCESS - Special Needs []  - Pediatric / Minor Patient Management 0 []  - Isolation Patient Management 0 []  - Hearing / Language / Visual special needs 0 []  - Assessment of Community assistance (transportation, D/C planning, etc.) 0 []  - Additional assistance / Altered mentation 0 []  - Support Surface(s) Assessment (bed, cushion, seat, etc.) 0 INTERVENTIONS - Wound Cleansing / Measurement []  - Simple Wound  Cleansing - one wound 0 X - Complex Wound Cleansing - multiple wounds 2 5 X - Wound Imaging (photographs - any number of wounds) 1 5 []  - Wound Tracing (instead of photographs) 0 []  -  Simple Wound Measurement - one wound 0 X - Complex Wound Measurement - multiple wounds 2 5 INTERVENTIONS - Wound Dressings X - Small Wound Dressing one or multiple wounds 2 10 []  - Medium Wound Dressing one or multiple wounds 0 []  - Large Wound Dressing one or multiple wounds 0 []  - Application of Medications - topical 0 []  - Application of Medications - injection 0 INTERVENTIONS - Miscellaneous []  - External ear exam 0 [name], [name] ([id number]) []  - Specimen Collection (cultures, biopsies, blood, body fluids, etc.) 0 []  - Specimen(s) / Culture(s) sent or taken to Lab for analysis 0 []  - Patient Transfer (multiple staff / Harrel Lemon Lift / Similar devices) 0 []  - Simple Staple / Suture removal (25 or less) 0 []  - Complex Staple / Suture removal (26 or more) 0 []  - Hypo / Hyperglycemic Management (close monitor of Blood Glucose) 0 []  - Ankle / Brachial Index (ABI) - do not check if billed separately 0 X - Vital Signs 1 5 Has the patient been seen at the hospital within the last three years: Yes Total Score: 105 Level Of Care: New/Established - Level 3 Electronic Signature(s) Signed: 02/22/2015 4:48:53 PM By: Montey Hora Entered By: Montey Hora on 02/22/2015 16:48:53 [name] ([id number]) -------------------------------------------------------------------------------- Encounter Discharge Information Details Patient Name: [name] Date of Service: 02/22/2015 12:45 PM Medical Record Number: [id number] Patient Account Number: [id number] Date of Birth/Sex: 1965-04-10 (50 y.o. Male) Treating RN: Montey Hora Primary Care Physician: [name] Other Clinician: Referring Physician: [name] Treating Physician/Extender: Frann Rider in Treatment:  6 Encounter Discharge Information Items Discharge Pain Level: 0 Discharge Condition: Stable Ambulatory Status: Ambulatory Discharge Destination: Home Private Transportation: Auto Accompanied By: self Schedule Follow-up Appointment: Yes Medication Reconciliation completed and No provided to Patient/Care [name]: Clinical Summary of Care: Electronic Signature(s) Signed: 02/22/2015 4:49:41 PM By: Montey Hora Entered By: Montey Hora on 02/22/2015 16:49:40 [name] ([id number]) -------------------------------------------------------------------------------- Lower Extremity Assessment Details Patient Name: [name] Date of Service: 02/22/2015 12:45 PM Medical Record Number: [id number] Patient Account Number: [id number] Date of Birth/Sex: 10/27/1965 (51 y.o. Male) Treating RN: Montey Hora Primary Care Physician: [name] Other Clinician: Referring Physician: [name] Treating Physician/Extender: Frann Rider in Treatment: 6 Edema Assessment Assessed: [Left: No] [Right: No] Edema: [Left: Ye] [Right: s] Vascular Assessment Pulses: Posterior Tibial Dorsalis Pedis Palpable: [Right:Yes] Extremity colors, hair growth, and conditions: Extremity Color: [Right:Red] Hair Growth on Extremity: [Right:Yes] Temperature of Extremity: [Right:Warm] Capillary Refill: [Right:< 3 seconds] Electronic Signature(s) Signed: 02/22/2015 5:40:21 PM By: Montey Hora Entered By: Montey Hora on 02/22/2015 12:59:07 [name] ([id number]) -------------------------------------------------------------------------------- Multi Wound Chart Details Patient Name: [name] Date of Service: 02/22/2015 12:45 PM Medical Record Number: [id number] Patient Account Number: [id number] Date of Birth/Sex: March 07, 1965 (50 y.o. Male) Treating RN: Montey Hora Primary Care Physician: [name] Other Clinician: Referring Physician:  [name] Treating Physician/Extender: Frann Rider in Treatment: 6 Vital Signs Height(in): 74 Pulse(bpm): 101 Weight(lbs): 288 Blood Pressure 113/66 (mmHg): Body Mass Index(BMI): 37 Temperature(F): 98.3 Respiratory Rate 18 (breaths/min): Photos: [1:No Photos] [2:No Photos] [N/A:N/A] Wound Location: [1:Right Foot - Plantar] [2:Right Metatarsal head fifth N/A - Lateral] Wounding Event: [1:Gradually Appeared] [2:Bump] [N/A:N/A] Primary Etiology: [1:Diabetic Wound/Ulcer of the Lower Extremity] [2:Diabetic Wound/Ulcer of N/A the Lower Extremity] Comorbid History: [1:Hypertension, Type II Diabetes, Neuropathy] [2:Hypertension, Type II Diabetes, Neuropathy] [N/A:N/A] Date Acquired: [1:12/30/2014] [2:02/19/2015] [N/A:N/A] Weeks of Treatment: [1:6] [2:0] [N/A:N/A] Wound Status: [1:Open] [2:Open] [N/A:N/A] Measurements L x W  x D 0.7x0.7x0.1 [2:0.6x0.6x0.4] [N/A:N/A] (cm) Area (cm) : [1:0.385] [2:0.283] [N/A:N/A] Volume (cm) : [1:0.038] [2:0.113] [N/A:N/A] % Reduction in Area: [1:51.00%] [2:N/A] [N/A:N/A] % Reduction in Volume: 83.90% [2:N/A] [N/A:N/A] Classification: [1:Grade 3] [2:Grade 3] [N/A:N/A] Wagner Verification: [1:Abscess] [2:Abscess] [N/A:N/A] Exudate Amount: [1:Large] [2:Large] [N/A:N/A] Exudate Type: [1:Serosanguineous] [2:Purulent] [N/A:N/A] Exudate Color: [1:red, brown] [2:yellow, brown, green] [N/A:N/A] Wound Margin: [1:Distinct, outline attached] [2:Flat and Intact] [N/A:N/A] Granulation Amount: [1:Large (67-100%)] [2:Medium (34-66%)] [N/A:N/A] Granulation Quality: [1:Red, Pink] [2:Red] [N/A:N/A] Necrotic Amount: [1:Small (1-33%)] [2:Medium (34-66%)] [N/A:N/A] Necrotic Tissue: [1:Adherent Slough] [2:Eschar, Adherent Slough] [N/A:N/A] Exposed Structures: [1:Fascia: No Fat: No Tendon: No] [2:Fascia: No Fat: No Tendon: No] [N/A:N/A] Muscle: No Muscle: No Joint: No Joint: No Bone: No Bone: No Limited to Skin Limited to Skin Breakdown  Breakdown Epithelialization: None None N/A Periwound Skin Texture: Callus: Yes Edema: Yes N/A Edema: No Excoriation: No Excoriation: No Induration: No Induration: No Callus: No Crepitus: No Crepitus: No Fluctuance: No Fluctuance: No Friable: No Friable: No Rash: No Rash: No Scarring: No Scarring: No Periwound Skin Moist: Yes Maceration: No N/A Moisture: Maceration: No Moist: No Dry/Scaly: No Dry/Scaly: No Periwound Skin Color: Atrophie Blanche: No Erythema: Yes N/A Cyanosis: No Atrophie Blanche: No Ecchymosis: No Cyanosis: No Erythema: No Ecchymosis: No Hemosiderin Staining: No Hemosiderin Staining: No Mottled: No Mottled: No Pallor: No Pallor: No Rubor: No Rubor: No Erythema Location: N/A Circumferential N/A Temperature: No Abnormality Hot N/A Tenderness on No Yes N/A Palpation: Wound Preparation: Ulcer Cleansing: Ulcer Cleansing: N/A Rinsed/Irrigated with Rinsed/Irrigated with Saline Saline Topical Anesthetic Topical Anesthetic Applied: Other: Lidocaine Applied: Other: lidocaine 4% 4% Treatment Notes Electronic Signature(s) Signed: 02/22/2015 5:40:21 PM By: Montey Hora Entered By: Montey Hora on 02/22/2015 13:39:12 [name] ([id number]) -------------------------------------------------------------------------------- Stem Details Patient Name: [name], [name]. Date of Service: 02/22/2015 12:45 PM Medical Record Number: [id number] Patient Account Number: [id number] Date of Birth/Sex: December 04, 1965 (50 y.o. Male) Treating RN: Montey Hora Primary Care Physician: [name] Other Clinician: Referring Physician: [name] Treating Physician/Extender: Frann Rider in Treatment: 6 Active Inactive HBO Nursing Diagnoses: Anxiety related to feelings of confinement associated with the hyperbaric oxygen chamber Anxiety related to knowledge deficit of hyperbaric oxygen therapy and treatment  procedures Discomfort related to temperature and humidity changes inside hyperbaric chamber Potential for barotraumas to ears, sinuses, teeth, and lungs or cerebral gas embolism related to changes in atmospheric pressure inside hyperbaric oxygen chamber Potential for oxygen toxicity seizures related to delivery of 100% oxygen at an increased atmospheric pressure Potential for pulmonary oxygen toxicity related to delivery of 100% oxygen at an increased atmospheric pressure Goals: Barotrauma will be prevented during HBO2 Date Initiated: 01/07/2015 Goal Status: Active Patient and/or family will be able to state/discuss factors appropriate to the management of their disease process during treatment Date Initiated: 01/07/2015 Goal Status: Active Patient will tolerate the hyperbaric oxygen therapy treatment Date Initiated: 01/07/2015 Goal Status: Active Patient will tolerate the internal climate of the chamber Date Initiated: 01/07/2015 Goal Status: Active Patient/caregiver will verbalize understanding of HBO goals, rationale, procedures and potential hazards Date Initiated: 01/07/2015 Goal Status: Active Signs and symptoms of pulmonary oxygen toxicity will be recognized and promptly addressed Date Initiated: 01/07/2015 Goal Status: Active Signs and symptoms of seizure will be recognized and promptly addressed ; seizing patients will suffer no harm Date Initiated: 01/07/2015 [name] ([id number]) Goal Status: Active Interventions: Administer a five (5) minute air break for patient if signs and symptoms of seizure appear and notify the hyperbaric physician  Administer a ten (10) minute air break for patient if signs and symptoms of seizure appear and notify the hyperbaric physician Administer decongestants, per physician orders, prior to HBO2 Administer the correct therapeutic gas delivery based on the patients needs and limitations, per physician order Assess and provide for patientos  comfort related to the hyperbaric environment and equalization of middle ear Assess for signs and symptoms related to adverse events, including but not limited to confinement anxiety, pneumothorax, oxygen toxicity and baurotrauma Assess patient for any history of confinement anxiety Assess patient's knowledge and expectations regarding hyperbaric medicine and provide education related to the hyperbaric environment, goals of treatment and prevention of adverse events Implement protocols to decrease risk of pneumothorax in high risk patients Notes: Orientation to the Wound Care Program Nursing Diagnoses: Knowledge deficit related to the wound healing center program Goals: Patient/caregiver will verbalize understanding of the [facility] Date Initiated: 01/07/2015 Goal Status: Active Interventions: Provide education on orientation to the wound center Notes: Peripheral Neuropathy Nursing Diagnoses: Knowledge deficit related to disease process and management of peripheral neurovascular dysfunction Potential alteration in peripheral tissue perfusion (select prior to confirmation of diagnosis) Goals: Patient/caregiver will verbalize understanding of disease process and disease management Date Initiated: 01/07/2015 Goal Status: Active [name], [name] ([id number]) Interventions: Assess signs and symptoms of neuropathy upon admission and as needed Provide education on Management of Neuropathy and Related Ulcers Provide education on Management of Neuropathy upon discharge from the [facility] for HBO Treatment Activities: Consult for HBO : 02/22/2015 Patient referred for customized footwear/offloading : 02/22/2015 Notes: Wound/Skin Impairment Nursing Diagnoses: Impaired tissue integrity Knowledge deficit related to ulceration/compromised skin integrity Goals: Patient/caregiver will verbalize understanding of skin care regimen Date Initiated: 01/07/2015 Goal  Status: Active Ulcer/skin breakdown will have a volume reduction of 30% by week 4 Date Initiated: 01/07/2015 Goal Status: Active Ulcer/skin breakdown will have a volume reduction of 50% by week 8 Date Initiated: 01/07/2015 Goal Status: Active Ulcer/skin breakdown will have a volume reduction of 80% by week 12 Date Initiated: 01/07/2015 Goal Status: Active Ulcer/skin breakdown will heal within 14 weeks Date Initiated: 01/07/2015 Goal Status: Active Interventions: Assess patient/caregiver ability to obtain necessary supplies Assess patient/caregiver ability to perform ulcer/skin care regimen upon admission and as needed Assess ulceration(s) every visit Provide education on ulcer and skin care Treatment Activities: Referred to DME [name] for dressing supplies : 02/22/2015 Skin care regimen initiated : 02/22/2015 [name], [name] ([id number]) Topical wound management initiated : 02/22/2015 Notes: Electronic Signature(s) Signed: 02/22/2015 5:40:21 PM By: Montey Hora Entered By: Montey Hora on 02/22/2015 13:39:03 [name] ([id number]) -------------------------------------------------------------------------------- Patient/Caregiver Education Details Patient Name: [name] Date of Service: 02/22/2015 12:45 PM Medical Record Number: [id number] Patient Account Number: [id number] Date of Birth/Gender: 04-18-1965 (50 y.o. Male) Treating RN: Montey Hora Primary Care Physician: [name] Other Clinician: Referring Physician: [name] Treating Physician/Extender: Frann Rider in Treatment: 6 Education Assessment Education Provided To: Patient Education Topics Provided Wound/Skin Impairment: Handouts: Other: packing wound Methods: Demonstration, Explain/Verbal Responses: State content correctly Electronic Signature(s) Signed: 02/22/2015 4:50:11 PM By: Montey Hora Entered By: Montey Hora on 02/22/2015 16:50:11 [name] ([id number]) -------------------------------------------------------------------------------- Wound Assessment Details Patient Name: [name] Date of Service: 02/22/2015 12:45 PM Medical Record Number: [id number] Patient Account Number: [id number] Date of Birth/Sex: [birth year] (50 y.o. Male) Treating RN: Montey Hora Primary Care Physician: [name] Other Clinician: Referring Physician: [name] Treating Physician/Extender: Frann Rider in Treatment: 6 Wound Status Wound  Number: 1 Primary Diabetic Wound/Ulcer of the Lower Etiology: Extremity Wound Location: Right Foot - Plantar Wound Status: Open Wounding Event: Gradually Appeared Comorbid Hypertension, Type II Diabetes, Date Acquired: 12/30/2014 History: Neuropathy Weeks Of Treatment: 6 Clustered Wound: No Photos Photo Uploaded By: Montey Hora on 02/22/2015 17:09:53 Wound Measurements Length: (cm) 0.7 Width: (cm) 0.7 Depth: (cm) 0.1 Area: (cm) 0.385 Volume: (cm) 0.038 % Reduction in Area: 51% % Reduction in Volume: 83.9% Epithelialization: None Tunneling: No Undermining: No Wound Description Classification: Grade 3 Wagner Verification: Abscess Wound Margin: Distinct, outline attached Exudate Amount: Large Exudate Type: Serosanguineous Exudate Color: red, brown Foul Odor After Cleansing: No Wound Bed Granulation Amount: Large (67-100%) Exposed Structure Granulation Quality: Red, Pink Fascia Exposed: No Necrotic Amount: Small (1-33%) Fat Layer Exposed: No [name], [name] ([id number]) Necrotic Quality: Adherent Slough Tendon Exposed: No Muscle Exposed: No Joint Exposed: No Bone Exposed: No Limited to Skin Breakdown Periwound Skin Texture Texture Color No Abnormalities Noted: No No Abnormalities Noted: No Callus: Yes Atrophie Blanche: No Crepitus: No Cyanosis: No Excoriation: No Ecchymosis: No Fluctuance: No Erythema: No Friable: No Hemosiderin  Staining: No Induration: No Mottled: No Localized Edema: No Pallor: No Rash: No Rubor: No Scarring: No Temperature / Pain Moisture Temperature: No Abnormality No Abnormalities Noted: No Dry / Scaly: No Maceration: No Moist: Yes Wound Preparation Ulcer Cleansing: Rinsed/Irrigated with Saline Topical Anesthetic Applied: Other: Lidocaine 4%, Treatment Notes Wound #1 (Right, Plantar Foot) 1. Cleansed with: Clean wound with Normal Saline 2. Anesthetic Topical Lidocaine 4% cream to wound bed prior to debridement 4. Dressing Applied: Iodoform packing Gauze 5. Secondary Dressing Applied Gauze and Kerlix/Conform 7. Secured with Recruitment consultant) Signed: 02/22/2015 5:40:21 PM By: Montey Hora Entered By: Montey Hora on 02/22/2015 13:01:10 [name] ([id number]) -------------------------------------------------------------------------------- Wound Assessment Details Patient Name: [name] Date of Service: 02/22/2015 12:45 PM Medical Record Number: [id number] Patient Account Number: [id number] Date of Birth/Sex: 24-Jun-1965 (50 y.o. Male) Treating RN: Montey Hora Primary Care Physician: [name] Other Clinician: Referring Physician: [name] Treating Physician/Extender: Frann Rider in Treatment: 6 Wound Status Wound Number: 2 Primary Diabetic Wound/Ulcer of the Lower Etiology: Extremity Wound Location: Right Metatarsal head fifth - Lateral Wound Status: Open Wounding Event: Bump Comorbid Hypertension, Type II Diabetes, History: Neuropathy Date Acquired: 02/19/2015 Weeks Of Treatment: 0 Clustered Wound: No Photos Photo Uploaded By: Montey Hora on 02/22/2015 17:09:54 Wound Measurements Length: (cm) 0.6 Width: (cm) 0.6 Depth: (cm) 0.4 Area: (cm) 0.283 Volume: (cm) 0.113 % Reduction in Area: % Reduction in Volume: Epithelialization: None Tunneling: No Undermining: No Wound  Description Classification: Grade 3 Wagner Verification: Abscess Wound Margin: Flat and Intact Exudate Amount: Large Exudate Type: Purulent Exudate Color: yellow, brown, green Foul Odor After Cleansing: No Wound Bed Granulation Amount: Medium (34-66%) Exposed Structure Granulation Quality: Red Fascia Exposed: No [name], [name] ([id number]) Necrotic Amount: Medium (34-66%) Fat Layer Exposed: No Necrotic Quality: Eschar, Adherent Slough Tendon Exposed: No Muscle Exposed: No Joint Exposed: No Bone Exposed: No Limited to Skin Breakdown Periwound Skin Texture Texture Color No Abnormalities Noted: No No Abnormalities Noted: No Callus: No Atrophie Blanche: No Crepitus: No Cyanosis: No Excoriation: No Ecchymosis: No Fluctuance: No Erythema: Yes Friable: No Erythema Location: Circumferential Induration: No Hemosiderin Staining: No Localized Edema: Yes Mottled: No Rash: No Pallor: No Scarring: No Rubor: No Moisture Temperature / Pain No Abnormalities Noted: No Temperature: Hot Dry / Scaly: No Tenderness on Palpation: Yes Maceration: No Moist: No Wound Preparation Ulcer Cleansing: Rinsed/Irrigated with Saline  Topical Anesthetic Applied: Other: lidocaine 4%, Treatment Notes Wound #2 (Right, Lateral Metatarsal head fifth) 1. Cleansed with: Clean wound with Normal Saline 2. Anesthetic Topical Lidocaine 4% cream to wound bed prior to debridement 4. Dressing Applied: Iodoform packing Gauze 5. Secondary Dressing Applied Gauze and Kerlix/Conform 7. Secured with Recruitment consultant) Signed: 02/22/2015 5:40:21 PM By: Montey Hora Entered By: Montey Hora on 02/22/2015 13:01:41 [name], [name] ([id number]) [name] ([id number]) -------------------------------------------------------------------------------- Vitals Details Patient Name: [name] Date of Service: 02/22/2015 12:45 PM Medical Record Number:  [id number] Patient Account Number: [id number] Date of Birth/Sex: 1965/07/31 (50 y.o. Male) Treating RN: Montey Hora Primary Care Physician: [name] Other Clinician: Referring Physician: [name] Treating Physician/Extender: Frann Rider in Treatment: 6 Vital Signs Time Taken: 12:53 Temperature (F): 98.3 Height (in): 74 Pulse (bpm): 101 Weight (lbs): 288 Respiratory Rate (breaths/min): 18 Body Mass Index (BMI): 37 Blood Pressure (mmHg): 113/66 Reference Range: 80 - 120 mg / dl Electronic Signature(s) Signed: 02/22/2015 5:40:21 PM By: Montey Hora Entered By: Montey Hora on 02/22/2015 12:58:43

## 2015-02-23 NOTE — Progress Notes (Signed)
ZEF, BRUSSO ([id number]) Visit Report for 02/22/2015 Arrival Information Details VASU, CURLES 02/22/2015 1:30 Patient Name: Date of Service: [name] Medical Record Patient Account Number: [id number] Number: Treating RN: Date of Birth/Sex: 1965-09-22 (50 y.o. Male) Other Clinician: [name] Primary Care Physician: [name] Treating Britto, Errol Referring Physician: [name] Physician/Extender: Suella Grove in Treatment: 6 Visit Information History Since Last Visit Added or deleted any medications: No Patient Arrived: Ambulatory Any new allergies or adverse reactions: No Arrival Time: 13:55 Had a fall or experienced change in No Accompanied By: self activities of daily living that may affect Transfer Assistance: None risk of falls: Patient Identification Verified: Yes Signs or symptoms of abuse/neglect No Secondary Verification Process Yes since last visito Completed: Hospitalized since last visit: No Patient Requires Transmission-Based No Has Dressing in Place as Prescribed: Yes Precautions: Has Footwear/Offloading in Place as Yes Patient Has Alerts: Yes Prescribed: Left: Surgical Shoe with Pressure Relief Insole Pain Present Now: No Electronic Signature(s) Signed: 02/22/2015 4:15:54 PM By: Lorine Bears RCP, RRT, CHT Entered By: Lorine Bears on 02/22/2015 13:59:47 [name] ([id number]) -------------------------------------------------------------------------------- Encounter Discharge Information Details SAQIB, SUNDE 02/22/2015 1:30 Patient Name: Date of Service: [name] Medical Record Patient Account Number: [id number] Number: Treating RN: Date of Birth/Sex: [birth year] (50 y.o. Male) Other Clinician: [name] Primary Care Physician: [name] Treating Christin Fudge Referring Physician: [name] Physician/Extender: Suella Grove in Treatment:  6 Encounter Discharge Information Items Discharge Pain Level: 0 Discharge Condition: Stable Ambulatory Status: Ambulatory Discharge Destination: Home Private Transportation: Auto Accompanied By: self Schedule Follow-up Appointment: No Medication Reconciliation completed and No provided to Patient/Care Halee Glynn: Clinical Summary of Care: Notes Patient has an HBO treatment scheduled on 02/23/15 at 13:00 pm. Electronic Signature(s) Signed: 02/22/2015 4:15:54 PM By: Lorine Bears RCP, RRT, CHT Entered By: Lorine Bears on 02/22/2015 16:12:03 [name] ([id number]) -------------------------------------------------------------------------------- Vitals Details Brazill, Leonell 02/22/2015 1:30 Patient Name: Date of Service: [name] Medical Record Patient Account Number: [id number] Number: Treating RN: Date of Birth/Sex: April 12, 1965 (50 y.o. Male) Other Clinician: [name] Primary Care Physician: [name] Treating Britto, Errol Referring Physician: [name] Physician/Extender: Suella Grove in Treatment: 6 Vital Signs Time Taken: 12:45 Temperature (F): 98.3 Height (in): 74 Pulse (bpm): 101 Weight (lbs): 288 Respiratory Rate (breaths/min): 18 Body Mass Index (BMI): 37 Blood Pressure (mmHg): 113/66 Capillary Blood Glucose (mg/dl): 200 Reference Range: 80 - 120 mg / dl Electronic Signature(s) Signed: 02/22/2015 4:15:54 PM By: Lorine Bears RCP, RRT, CHT Entered By: Lorine Bears on 02/22/2015 14:00:30

## 2015-02-23 NOTE — Progress Notes (Signed)
[name], [name] ([id number]) Visit Report for 02/22/2015 Chief Complaint Document Details [name], [name] 02/22/2015 12:45 Patient Name: Date of Service: [name] Medical Record Patient Account Number: [id number] Number: Treating RN: Montey Hora Date of Birth/Sex: March 26, 1965 (50 y.o. Male) Other Clinician: Primary Care Physician: [name] Treating Christin Fudge Referring Physician: [name] Physician/Extender: Suella Grove in Treatment: 6 Information Obtained from: Patient Chief Complaint Patients presents for treatment of an open diabetic ulcer on the plantar aspect of the right foot which she's had for about 4 weeks Electronic Signature(s) Signed: 02/22/2015 1:42:25 PM By: Christin Fudge MD, FACS Entered By: Christin Fudge on 02/22/2015 13:42:25 [name] ([id number]) -------------------------------------------------------------------------------- HPI Details [name], [name] 02/22/2015 12:45 Patient Name: Date of Service: [name] Medical Record Patient Account Number: [id number] Number: Treating RN: Montey Hora Date of Birth/Sex: 1965/06/02 (50 y.o. Male) Other Clinician: Primary Care Physician: [name] Treating Christin Fudge Referring Physician: [name] Physician/Extender: Weeks in Treatment: 6 History of Present Illness Location: plantar aspect of the right forefoot Quality: Patient reports No Pain. Severity: Patient states wound are getting worse. Duration: Patient has had the wound for < 4 weeks prior to presenting for treatment Timing: he has minimal discharge from the wound Context: The wound appeared gradually over time Modifying Factors: Other treatment(s) tried include:plan local care but not offloading Associated Signs and Symptoms: Patient reports having:no pain or discharge from the wound. HPI Description: This 50 year old male comes with an ulcerated area on the plantar aspect of the right  foot which she's had for approximately a month. I have known him from a previous visit at [facility] wound center and was treated in the months of April and May 2016 and rapidly healed a left plantar ulcer with a total contact cast. He has been a diabetic for about 16 years and tries to keep active and is fairly compliant with his diabetes management. 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 Past medical history significant for hypertension, hyperlipidemia, and status post appendectomy 1993. He does not smoke or drink alcohol. 01/14/2015 -- the patient had tolerated his total contact cast very well and had no problems and has had no systemic symptoms. However when his total contact cast was cut open he had excessive amount of purulent drainage in spite of being on antibiotics. He had had a recent x-ray done in the ER 12/22/2014 which showed IMPRESSION:No evidence of osseous erosion. Known soft tissue ulceration is not well characterized on radiograph. Scattered vascular calcifications seen. his last hemoglobin A1c in December was 7.3. He has been on Augmentin and doxycycline for the last 2 weeks. 01/21/2015 -- his culture grew rare growth of Pantoea species an MR moderate growth of Candida parapsilosis. it is sensitive to levofloxacin. He has not heard back from the insurance company regarding his hyperbaric oxygen therapy. His MRI has not been done yet and we will try and get him an earlier date 01/28/2015 -- MRI was done last night -- IMPRESSION:1. Soft tissue ulcer overlying the plantar aspect of the fifth metatarsal head extending to the cortex. Subcortical marrow edema in the fifth metatarsal head with corresponding T1 hypointensity is concerning for early osteomyelitis of the plantar lateral aspect of the fifth metatarsal head. Chest x-ray done on 01/14/2015 shows bronchiectatic changes without infiltrate. EKG done on generally 17 2017 shows a normal sinus rhythm and is a normal  EKG. [name], [name] ([id number]) 02/04/2015 -- he was asked to see [name] last week and had 2 appointments but had  to cancel both due to pressures of work. Last night he has woken up with severe pain in the foot and leg and it is swollen up. No fever or no change in his blood glucose. Addendum: I spoke with [name] who kindly agreed to accept the patient for inpatient therapy and have also opened to the hospitalist [name], and discuss details of the management including PICC line and repeat cultures. 02/12/2015-- -- was seen by [name] in the hospital and a PICC line was placed. He was to receive Ceftazidime 2 g every 12 hourly, oral levofloxacin 750 mg every 24 hourly and oral fluconazole 200 mg daily. The antibiotics were to be given for 4 weeks except the Diflucan was to be given for the first 2 weeks. Reviewed note from 02/10/2015 -- and [name] had recommended management for growth of MSSA and Serratia. He switched him from ceftazidime to ceftriaxone 2 g every 24 hours. Levofloxacin was stopped and he would continue on fluconazole for another week. He had asked me to decide whether further imaging was necessary and whether surgical debridement of the infected bone was needed. He is doing well and has been off work for this week and we will keep him off the next week. 02/22/2015 -- he was seen by my colleague on 01/19/2015 and at that time an incision and drainage was done on his right lateral forefoot on the dorsum. Today when I probed this wound it is frankly draining pus and it communicates with the ulcer on the plantar aspect of his right foot. The patient is still on IV ceftriaxone 2 g every 24 hours and is to be seen by [name] on Friday. Electronic Signature(s) Signed: 02/22/2015 1:44:23 PM By: Christin Fudge MD, FACS Entered By: Christin Fudge on 02/22/2015 13:44:23 DEVONTE, ENDRIZZI  ([id number]) -------------------------------------------------------------------------------- Physical Exam Details [name], [name] 02/22/2015 12:45 Patient Name: Date of Service: [name] Medical Record Patient Account Number: [id number] Number: Treating RN: Montey Hora Date of Birth/Sex: Feb 06, 1965 (50 y.o. Male) Other Clinician: Primary Care Physician: [name] Treating Christin Fudge Referring Physician: [name] Physician/Extender: Weeks in Treatment: 6 Constitutional . Pulse regular. Respirations normal and unlabored. Afebrile. . Eyes Nonicteric. Reactive to light. Ears, Nose, Mouth, and Throat Lips, teeth, and gums WNL.Marland Kitchen Moist mucosa without lesions. Neck supple and nontender. No palpable supraclavicular or cervical adenopathy. Normal sized without goiter. Respiratory WNL. No retractions.. Cardiovascular Pedal Pulses WNL. No clubbing, cyanosis or edema. Lymphatic No adneopathy. No adenopathy. No adenopathy. Musculoskeletal Adexa without tenderness or enlargement.. Digits and nails w/o clubbing, cyanosis, infection, petechiae, ischemia, or inflammatory conditions.. Integumentary (Hair, Skin) No suspicious lesions. No crepitus or fluctuance. No peri-wound warmth or erythema. No masses.Marland Kitchen Psychiatric Judgement and insight Intact.. No evidence of depression, anxiety, or agitation.. Notes the new wound on the dorsum of the right foot near the fifth metatarsal is communicating with the wound on the plantar aspect of the right foot and there is purulent material which has been cultured today. The wound probes down to bone. Electronic Signature(s) Signed: 02/22/2015 1:45:14 PM By: Christin Fudge MD, FACS Entered By: Christin Fudge on 02/22/2015 13:45:13 KENT, GRABINSKI ([id number]) -------------------------------------------------------------------------------- Physician Orders Details EWIN, ROYSTER 02/22/2015 12:45 Patient Name: Date of  Service: [name] Medical Record Patient Account Number: [id number] Number: Treating RN: Montey Hora Date of Birth/Sex: 1965-09-09 (50 y.o. Male) Other Clinician: Primary Care Physician: [name] Treating Christin Fudge Referring Physician: [name] Physician/Extender: Suella Grove in Treatment: 6 Verbal / Phone  Orders: Yes Clinician: Montey Hora Read Back and Verified: Yes Diagnosis Coding Wound Cleansing Wound #1 Right,Plantar Foot o Cleanse wound with mild soap and water o May Shower, gently pat wound dry prior to applying new dressing. Wound #2 Right,Lateral Metatarsal head fifth o Cleanse wound with mild soap and water o May Shower, gently pat wound dry prior to applying new dressing. Anesthetic Wound #1 Right,Plantar Foot o Topical Lidocaine 4% cream applied to wound bed prior to debridement Wound #2 Right,Lateral Metatarsal head fifth o Topical Lidocaine 4% cream applied to wound bed prior to debridement Primary Wound Dressing Wound #1 Right,Plantar Foot o Iodoform packing Gauze Wound #2 Right,Lateral Metatarsal head fifth o Iodoform packing Gauze Secondary Dressing Wound #1 Right,Plantar Foot o Gauze and Kerlix/Conform Wound #2 Right,Lateral Metatarsal head fifth o Gauze and Kerlix/Conform Dressing Change Frequency Wound #1 Right,Plantar Foot o Change dressing every day. CHEVAS, COLYER ([id number]) Wound #2 Right,Lateral Metatarsal head fifth o Change dressing every day. Follow-up Appointments Wound #1 Right,Plantar Foot o Return Appointment in 1 week. Wound #2 Right,Lateral Metatarsal head fifth o Return Appointment in 1 week. Off-Loading Wound #1 Right,Plantar Foot o Open toe surgical shoe with peg assist. - front offload Wound #2 Right,Lateral Metatarsal head fifth o Open toe surgical shoe with peg assist. - front offload Medications-please add to medication list. Wound #1 Right,Plantar  Foot o Other: - continue IV antibiotics Wound #2 Right,Lateral Metatarsal head fifth o Other: - continue IV antibiotics Laboratory o Bacteria identified in Wound by Culture (MICRO) oooo LOINC Code: [id number] oooo Convenience Name: Wound culture routine Electronic Signature(s) Signed: 02/22/2015 2:58:24 PM By: Christin Fudge MD, FACS Signed: 02/22/2015 5:40:21 PM By: Montey Hora Entered By: Montey Hora on 02/22/2015 13:40:50 [name] ([id number]) -------------------------------------------------------------------------------- Problem List Details CHRISTIN, JONES 02/22/2015 12:45 Patient Name: Date of Service: [name] Medical Record Patient Account Number: [id number] Number: Treating RN: Montey Hora Date of Birth/Sex: Jun 22, 1965 (50 y.o. 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 Medical Record Patient Account Number: [id number] Number: Treating RN: Montey Hora Date of Birth/Sex: 26-Aug-1965 (50 y.o. Male) Other Clinician: Primary Care  Physician: [name] Treating Dyquan Minks Referring Physician: [name] Physician/Extender: Suella Grove in Treatment: 6 Subjective Chief Complaint Information obtained from Patient Patients presents for treatment of an open diabetic ulcer on the plantar aspect of the right foot which she's had for about 4 weeks History of Present Illness (HPI) The following HPI elements were documented for the patient's wound: Location: plantar aspect of the right forefoot Quality: Patient reports No Pain. Severity: Patient states wound are getting worse. Duration: Patient has had the wound for < 4 weeks prior to presenting for treatment Timing: he has minimal discharge from the wound Context: The wound appeared gradually over time Modifying Factors: Other treatment(s) tried include:plan local care but not offloading Associated Signs  and Symptoms: Patient reports having:no pain or discharge from the wound. This 50 year old male comes with an ulcerated area on the plantar aspect of the right foot which she's had for approximately a month. I have known him from a previous visit at [facility] wound center and was treated in the months of April and May 2016 and rapidly healed a left plantar ulcer with a total contact cast. He has been a diabetic for about 16 years and tries to keep active and is fairly compliant with his diabetes management. 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 Past medical history significant for hypertension, hyperlipidemia, and status post appendectomy 1993. He does not smoke or drink alcohol. 01/14/2015 -- the patient had tolerated his total contact cast very well and had no problems and has had no systemic symptoms. However when his total contact cast was cut open he had excessive amount of purulent drainage in spite of being on antibiotics. He had had a recent x-ray done in the ER 12/22/2014 which showed IMPRESSION:No evidence of osseous erosion. Known soft tissue  ulceration is not well characterized on radiograph. Scattered vascular calcifications seen. his last hemoglobin A1c in December was 7.3. He has been on Augmentin and doxycycline for the last 2 weeks. 01/21/2015 -- his culture grew rare growth of Pantoea species an MR moderate growth of Candida parapsilosis. it is sensitive to levofloxacin. He has not heard back from the insurance company regarding his hyperbaric oxygen therapy. NEWLAND, SANTELL ([id number]) His MRI has not been done yet and we will try and get him an earlier date 01/28/2015 -- MRI was done last night -- IMPRESSION:1. Soft tissue ulcer overlying the plantar aspect of the fifth metatarsal head extending to the cortex. Subcortical marrow edema in the fifth metatarsal head with corresponding T1 hypointensity is concerning for early osteomyelitis of the plantar lateral aspect of the fifth metatarsal head. Chest x-ray done on 01/14/2015 shows bronchiectatic changes without infiltrate. EKG done on generally 17 2017 shows a normal sinus rhythm and is a normal EKG. 02/04/2015 -- he was asked to see [name] last week and had 2 appointments but had to cancel both due to pressures of work. Last night he has woken up with severe pain in the foot and leg and it is swollen up. No fever or no change in his blood glucose. Addendum: I spoke with [name] who kindly agreed to accept the patient for inpatient therapy and have also opened to the hospitalist [name], and discuss details of the management including PICC line and repeat cultures. 02/12/2015-- -- was seen by [name] in the hospital and a PICC line was placed. He was to receive Ceftazidime 2 g every 12 hourly, oral levofloxacin 750 mg every 24 hourly and oral fluconazole 200 mg daily. The antibiotics were to be given for 4 weeks except the Diflucan was to be given for the first 2 weeks. Reviewed note from 02/10/2015 -- and [name] had recommended  management for growth of MSSA and Serratia. He switched him from ceftazidime to ceftriaxone 2 g every 24 hours. Levofloxacin was stopped and he would continue on fluconazole for another week. He had asked me to decide whether further imaging was necessary and whether surgical debridement of the infected bone was needed. He is doing well and has been off work for this week and we will keep him off the next week. 02/22/2015 -- he was seen by my colleague on 01/19/2015 and at that time an incision and  drainage was done on his right lateral forefoot on the dorsum. Today when I probed this wound it is frankly draining pus and it communicates with the ulcer on the plantar aspect of his right foot. The patient is still on IV ceftriaxone 2 g every 24 hours and is to be seen by [name] on Friday. Objective Constitutional Pulse regular. Respirations normal and unlabored. Afebrile. Vitals Time Taken: 12:53 PM, Height: 74 in, Weight: 288 lbs, BMI: 37, Temperature: 98.3 F, Pulse: 101 bpm, Respiratory Rate: 18 breaths/min, Blood Pressure: 113/66 mmHg. Eyes Nonicteric. Reactive to light. Ears, Nose, Mouth, and Throat Lips, teeth, and gums WNL.Marland Kitchen Moist mucosa without lesions. DMARCUS, ALISON ([id number]) Neck supple and nontender. No palpable supraclavicular or cervical adenopathy. Normal sized without goiter. Respiratory WNL. No retractions.. Cardiovascular Pedal Pulses WNL. No clubbing, cyanosis or edema. Lymphatic No adneopathy. No adenopathy. No adenopathy. Musculoskeletal Adexa without tenderness or enlargement.. Digits and nails w/o clubbing, cyanosis, infection, petechiae, ischemia, or inflammatory conditions.Marland Kitchen Psychiatric Judgement and insight Intact.. No evidence of depression, anxiety, or agitation.. General Notes: the new wound on the dorsum of the right foot near the fifth metatarsal is communicating with the wound on the plantar aspect of the right foot and there is  purulent material which has been cultured today. The wound probes down to bone. Integumentary (Hair, Skin) No suspicious lesions. No crepitus or fluctuance. No peri-wound warmth or erythema. No masses.. Wound #1 status is Open. Original cause of wound was Gradually Appeared. The wound is located on the [place]. The wound measures 0.7cm length x 0.7cm width x 0.1cm depth; 0.385cm^2 area and 0.038cm^3 volume. The wound is limited to skin breakdown. There is no tunneling or undermining noted. There is a large amount of serosanguineous drainage noted. The wound margin is distinct with the outline attached to the wound base. There is large (67-100%) red, pink granulation within the wound bed. There is a small (1-33%) amount of necrotic tissue within the wound bed including Adherent Slough. The periwound skin appearance exhibited: Callus, Moist. The periwound skin appearance did not exhibit: Crepitus, Excoriation, Fluctuance, Friable, Induration, Localized Edema, Rash, Scarring, Dry/Scaly, Maceration, Atrophie Blanche, Cyanosis, Ecchymosis, Hemosiderin Staining, Mottled, Pallor, Rubor, Erythema. Periwound temperature was noted as No Abnormality. Wound #2 status is Open. Original cause of wound was Bump. The wound is located on the Right,Lateral Metatarsal head fifth. The wound measures 0.6cm length x 0.6cm width x 0.4cm depth; 0.283cm^2 area and 0.113cm^3 volume. The wound is limited to skin breakdown. There is no tunneling or undermining noted. There is a large amount of purulent drainage noted. The wound margin is flat and intact. There is medium (34-66%) red granulation within the wound bed. There is a medium (34-66%) amount of necrotic tissue within the wound bed including Eschar and Adherent Slough. The periwound skin appearance exhibited: Localized Edema, Erythema. The periwound skin appearance did not exhibit: Callus, Crepitus, Excoriation, Fluctuance, Friable, Induration, Rash,  Scarring, Dry/Scaly, Maceration, Moist, Atrophie Blanche, Cyanosis, Ecchymosis, Hemosiderin Staining, Mottled, Pallor, Rubor. The surrounding wound skin color is noted with erythema which is circumferential. Periwound temperature was noted as Hot. The periwound has tenderness on palpation. [name], [name] ([id number]) Assessment Active Problems ICD-10 E11.621 - Type 2 diabetes mellitus with foot ulcer L97.512 - 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d L84 - Corns and callosities L02.611 - Cutaneous abscess of right foot M86.371 - Chronic multifocal osteomyelitis, right ankle and foot At this stage in spite of IV  antibiotics and hyperbaric oxygen he seems to have a florid infection in his right lateral forefoot. I will speak to [name] and he may make appropriate changes with the new culture. By Friday if there is no significant clinical improvement he may also require surgical debridement and we will speak to [name] to see him for the same. I have recommended packing the wounds with quarter-inch iodoform gauze and changing it daily and he will continue with aggressive local care, IV antibiotics and hyperbaric oxygen therapy. I will see him back on Friday. Plan Wound Cleansing: Wound #1 Right,Plantar Foot: Cleanse wound with mild soap and water May Shower, gently pat wound dry prior to applying new dressing. Wound #2 Right,Lateral Metatarsal head fifth: Cleanse wound with mild soap and water May Shower, gently pat wound dry prior to applying new dressing. Anesthetic: Wound #1 Right,Plantar Foot: Topical Lidocaine 4% cream applied to wound bed prior to debridement Wound #2 Right,Lateral Metatarsal head fifth: Topical Lidocaine 4% cream applied to wound bed prior to debridement Primary Wound Dressing: Wound #1 Right,Plantar Foot: Iodoform packing Gauze Wound #2 Right,Lateral Metatarsal head fifth: Iodoform packing  Gauze Secondary Dressing: Wound #1 Right,Plantar Foot: Gauze and Kerlix/Conform SEDALE, VANECEK. ([id number]) Wound #2 Right,Lateral Metatarsal head fifth: Gauze and Kerlix/Conform Dressing Change Frequency: Wound #1 Right,Plantar Foot: Change dressing every day. Wound #2 Right,Lateral Metatarsal head fifth: Change dressing every day. Follow-up Appointments: Wound #1 Right,Plantar Foot: Return Appointment in 1 week. Wound #2 Right,Lateral Metatarsal head fifth: Return Appointment in 1 week. Off-Loading: Wound #1 Right,Plantar Foot: Open toe surgical shoe with peg assist. - front offload Wound #2 Right,Lateral Metatarsal head fifth: Open toe surgical shoe with peg assist. - front offload Medications-please add to medication list.: Wound #1 Right,Plantar Foot: Other: - continue IV antibiotics Wound #2 Right,Lateral Metatarsal head fifth: Other: - continue IV antibiotics Laboratory ordered were: Wound culture routine At this stage in spite of IV antibiotics and hyperbaric oxygen he seems to have a florid infection in his right lateral forefoot. I will speak to [name] and he may make appropriate changes with the new culture. By Friday if there is no significant clinical improvement he may also require surgical debridement and we will speak to [name] to see him for the same. I have recommended packing the wounds with quarter-inch iodoform gauze and changing it daily and he will continue with aggressive local care, IV antibiotics and hyperbaric oxygen therapy. I will see him back on Friday. Electronic Signature(s) Signed: 02/22/2015 1:47:56 PM By: Christin Fudge MD, FACS Entered By: Christin Fudge on 02/22/2015 13:47:56 [name] ([id number]) -------------------------------------------------------------------------------- SuperBill Details Patient Name: [name] Date of Service: 02/22/2015 Medical Record Number: [id number] Patient  Account Number: [id number] Date of Birth/Sex: 01-18-1965 (50 y.o. Male) Treating RN: Montey Hora Primary Care Physician: [name] Other Clinician: Referring Physician: [name] Treating Physician/Extender: Frann Rider in Treatment: 6 Diagnosis Coding ICD-10 Codes Code Description [phone] Type 2 diabetes mellitus with foot ulcer L97.512 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d L84 Corns and callosities L02.611 Cutaneous abscess of right foot M86.371 Chronic multifocal osteomyelitis, right ankle and foot Facility Procedures CPT4 Code: [id number] Description: 99213 - WOUND CARE VISIT-LEV 3 EST PT Modifier: Quantity: 1 Physician Procedures CPT4 Code Description: [id number] - WC PHYS LEVEL 3 - EST PT ICD-10 Description Diagnosis E11.621 Type 2 diabetes mellitus with foot ulcer L97.512 Non-pressure chronic ulcer of other part of right fo  L02.611 Cutaneous abscess of right foot M86.371  Chronic multifocal osteomyelitis, right ankle and fo Modifier: ot with fat lay ot Quantity: 1 er exposed Electronic Signature(s) Signed: 02/22/2015 4:49:02 PM By: Montey Hora Signed: 02/22/2015 5:00:32 PM By: Christin Fudge MD, FACS Previous Signature: 02/22/2015 1:48:18 PM Version By: Christin Fudge MD, FACS Entered By: Montey Hora on 02/22/2015 16:49:02

## 2015-02-23 NOTE — Progress Notes (Signed)
DRACO, DONATE ([id number]) Visit Report for 02/22/2015 HBO Details ANTHONEY, DICKE 02/22/2015 1:30 Patient Name: Date of Service: [name] Medical Record Patient Account Number: [id number] Number: Treating RN: Date of Birth/Sex: May 05, 1965 (50 y.o. Male) Other Clinician: [name] Primary Care Physician: [name] Treating Britto, Errol Referring Physician: [name] Physician/Extender: Suella Grove in Treatment: 6 HBO Treatment Course Details Treatment Course Ordering Physician: Christin Fudge 1 Number: HBO Treatment Start Date: 02/01/2015 Total Treatments 30 Ordered: HBO Indication: Diabetic Ulcer(s) of the Lower Extremity HBO Treatment Details Treatment Number: 15 Patient Type: Outpatient Chamber Type: Monoplace Chamber Serial #: [id number] Treatment Protocol: 2.0 ATA with 90 minutes oxygen, and no air breaks Treatment Details Compression Rate Down: 1.5 psi / minute De-Compression Rate Up: 1.5 psi / minute Air breaks and breathing Compress Tx Pressure periods Decompress Decompress Begins Reached (leave unused spaces Begins Ends blank) Chamber Pressure (ATA) 1 2 - - - - - - 2 1 Clock Time (24 hr) 13:59 14:11 - - - - - - 15:41 15:51 Treatment Length: 112 (minutes) Treatment Segments: 4 Capillary Blood Glucose Pre Capillary Blood Glucose (mg/dl): Post Capillary Blood Glucose (mg/dl): Vital Signs Capillary Blood Glucose Reference Range: 80 - 120 mg / dl HBO Diabetic Blood Glucose Intervention Range: <131 mg/dl or >249 mg/dl Time Vitals Blood Respiratory Capillary Blood Glucose Pulse Action Type: Pulse: Temperature: Taken: Pressure: Rate: Glucose (mg/dl): Meter #: Oximetry (%) Taken: Pre 12:45 113/66 101 18 98.3 200 1 none Post 15:55 116/64 84 18 98.1 150 1 none Flavell, Keenon J. ([id number]) Treatment Response Treatment Completion Status: Treatment Completed without Adverse Event HBO Attestation I certify that I supervised this  HBO treatment in accordance with Medicare guidelines. A trained Yes emergency response team is readily available per hospital policies and procedures. Continue HBOT as ordered. Yes Electronic Signature(s) Signed: 02/22/2015 4:53:36 [name] By: Christin Fudge MD, FACS Previous Signature: 02/22/2015 4:15:54 [name] Version By: Lorine Bears RCP, RRT, CHT Previous Signature: 02/22/2015 2:58:24 [name] Version By: Christin Fudge MD, FACS Entered By: Christin Fudge on 02/22/2015 16:53:36 TRIPPER, SCHNEEKLOTH ([id number]) -------------------------------------------------------------------------------- HBO Safety Checklist Details TAYSHON, BARRIENTES 02/22/2015 1:30 Patient Name: Date of Service: [name] Medical Record Patient Account Number: [id number] Number: Treating RN: Date of Birth/Sex: Jun 10, 1965 (50 y.o. Male) Other Clinician: [name] Primary Care Physician: [name] Treating Britto, Errol Referring Physician: [name] Physician/Extender: Suella Grove in Treatment: 6 HBO Safety Checklist Items Safety Checklist Consent Form Signed Patient voided / foley secured and emptied When did you last eato 11:30 am Last dose of injectable or oral agent 11:30 am NA Ostomy pouch emptied and vented if applicable NA All implantable devices assessed, documented and approved Intravenous access site secured and place Valuables secured Linens and cotton and cotton/polyester blend (less than 51% polyester) Personal oil-based products / skin lotions / body lotions removed Wigs or hairpieces removed Smoking or tobacco materials removed Books / newspapers / magazines / loose paper removed Cologne, aftershave, perfume and deodorant removed Jewelry removed (may wrap wedding band) Make-up removed Hair care products removed Battery operated devices (external) removed Heating patches and chemical warmers removed NA Titanium eyewear removed NA Nail polish cured greater than 10  hours NA Casting material cured greater than 10 hours NA Hearing aids removed NA Loose dentures or partials removed NA Prosthetics have been removed Patient demonstrates correct use of air break device (if applicable) Patient concerns have been addressed Patient grounding bracelet on and cord attached to chamber Specifics for Inpatients (complete in addition to  above) Medication sheet sent with patient RENSO, MICHEAU ([id number]) Intravenous medications needed or due during therapy sent with patient Drainage tubes (e.g. nasogastric tube or chest tube secured and vented) Endotracheal or Tracheotomy tube secured Cuff deflated of air and inflated with saline Airway suctioned Electronic Signature(s) Signed: 02/22/2015 4:15:54 [name] By: Lorine Bears RCP, RRT, CHT Entered By: Lorine Bears on 02/22/2015 14:01:31

## 2015-02-24 ENCOUNTER — Encounter: Payer: Managed Care, Other (non HMO) | Admitting: Internal Medicine

## 2015-02-24 DIAGNOSIS — E11621 Type 2 diabetes mellitus with foot ulcer: Secondary | ICD-10-CM | POA: Diagnosis not present

## 2015-02-24 LAB — GLUCOSE, CAPILLARY
Glucose-Capillary: 195 mg/dL — ABNORMAL HIGH (ref 65–99)
Glucose-Capillary: 164 mg/dL — ABNORMAL HIGH (ref 65–99)

## 2015-02-24 NOTE — Progress Notes (Signed)
[name], [name] ([id number]) Visit Report for 02/23/2015 Arrival Information Details [name], [name] Date of Service: 02/23/2015 1:30 PM Patient Name: J. Patient Account Number: [id number] Medical Record Treating RN: [id number] Number: Other Clinician: [name] Date of Birth/Sex: 1965/02/15 (50 y.o. Male) Treating Dellia Nims, [place] Primary Care Physician/Extender: Floyde Parkins Physician: Referring Physician: Sharilyn Sites in Treatment: 6 Visit Information History Since Last Visit Added or deleted any medications: No Patient Arrived: Ambulatory Any new allergies or adverse reactions: No Arrival Time: 12:45 Had a fall or experienced change in No Accompanied By: self activities of daily living that may affect Transfer Assistance: None risk of falls: Patient Identification Verified: Yes Signs or symptoms of abuse/neglect No Secondary Verification Process Yes since last visito Completed: Hospitalized since last visit: No Patient Requires Transmission-Based No Has Dressing in Place as Prescribed: Yes Precautions: Has Footwear/Offloading in Place as Yes Patient Has Alerts: Yes Prescribed: Left: Surgical Shoe with Pressure Relief Insole Pain Present Now: No Electronic Signature(s) Signed: 02/23/2015 5:02:33 PM By: Lorine Bears RCP, RRT, CHT Entered By: Becky Sax, Amado Nash on 02/23/2015 12:51:55 [name] ([id number]) -------------------------------------------------------------------------------- Encounter Discharge Information Details [name] Date of Service: 02/23/2015 1:30 PM Patient Name: J. Patient Account Number: [id number] Medical Record Treating RN: [id number] Number: Other Clinician: [name] Date of Birth/Sex: 1965-01-21 (50 y.o. Male) Treating [name], [name] Primary Care Physician/Extender: Floyde Parkins Physician: Referring Physician: Sharilyn Sites in Treatment:  6 Encounter Discharge Information Items Discharge Pain Level: 0 Discharge Condition: Stable Ambulatory Status: Ambulatory Discharge Destination: Home Private Transportation: Auto Accompanied By: self Schedule Follow-up Appointment: No Medication Reconciliation completed and No provided to Patient/Care Joyce Leckey: Clinical Summary of Care: Notes Patient has an HBO treatment scheduled on 02/24/15 at 13:00 pm. Electronic Signature(s) Signed: 02/23/2015 5:02:33 PM By: Lorine Bears RCP, RRT, CHT Entered By: Becky Sax, Amado Nash on 02/23/2015 15:07:45 [name] ([id number]) -------------------------------------------------------------------------------- Vitals Details [name] Date of Service: 02/23/2015 1:30 PM Patient Name: J. Patient Account Number: [id number] Medical Record Treating RN: [id number] Number: Other Clinician: [name] Date of Birth/Sex: 09-18-1965 (50 y.o. Male) Treating [name], [name] Primary Care Physician/Extender: Floyde Parkins Physician: Referring Physician: Sharilyn Sites in Treatment: 6 Vital Signs Time Taken: 12:46 Temperature (F): 98.2 Height (in): 74 Pulse (bpm): 84 Weight (lbs): 288 Respiratory Rate (breaths/min): 18 Body Mass Index (BMI): 37 Blood Pressure (mmHg): 108/76 Capillary Blood Glucose (mg/dl): 213 Reference Range: 80 - 120 mg / dl Electronic Signature(s) Signed: 02/23/2015 5:02:33 PM By: Lorine Bears RCP, RRT, CHT Entered By: Lorine Bears on 02/23/2015 12:52:20

## 2015-02-24 NOTE — Progress Notes (Signed)
ISANDRO, SCHORER ([id number]) Visit Report for 02/23/2015 HBO Details [name] Date of Service: 02/23/2015 1:30 PM Patient Name: J. Patient Account Number: [id number] Medical Record Treating RN: [id number] Number: Other Clinician: [name] Date of Birth/Sex: Feb 27, 1965 (50 y.o. Male) Treating [name], [name] Primary Care Physician/Extender: Floyde Parkins Physician: Referring Physician: Sharilyn Sites in Treatment: 6 HBO Treatment Course Details Treatment Course Ordering Physician: Christin Fudge 1 Number: HBO Treatment Start Date: 02/01/2015 Total Treatments 30 Ordered: HBO Indication: Diabetic Ulcer(s) of the Lower Extremity HBO Treatment Details Treatment Number: 16 Patient Type: Outpatient Chamber Type: Monoplace Chamber Serial #: [id number] Treatment Protocol: 2.0 ATA with 90 minutes oxygen, and no air breaks Treatment Details Compression Rate Down: 1.5 psi / minute De-Compression Rate Up: 1.5 psi / minute Air breaks and breathing Compress Tx Pressure periods Decompress Decompress Begins Reached (leave unused spaces Begins Ends blank) Chamber Pressure (ATA) 1 2 - - - - - - 2 1 Clock Time (24 hr) 12:57 13:09 - - - - - - 14:39 14:49 Treatment Length: 112 (minutes) Treatment Segments: 4 Capillary Blood Glucose Pre Capillary Blood Glucose (mg/dl): Post Capillary Blood Glucose (mg/dl): Vital Signs Capillary Blood Glucose Reference Range: 80 - 120 mg / dl HBO Diabetic Blood Glucose Intervention Range: <131 mg/dl or >249 mg/dl Time Vitals Blood Respiratory Capillary Blood Glucose Pulse Action Type: Pulse: Temperature: Taken: Pressure: Rate: Glucose (mg/dl): Meter #: Oximetry (%) Taken: Pre 12:46 108/76 84 18 98.2 213 1 none Post 14:54 112/56 84 18 98.4 154 1 none Hodgens, Antwon J. ([id number]) Treatment Response Treatment Completion Status: Treatment Completed without Adverse Event Physician Notes No concerns with treatment  given HBO Attestation I certify that I supervised this HBO treatment in accordance with Medicare guidelines. A trained Yes emergency response team is readily available per hospital policies and procedures. Continue HBOT as ordered. Yes Electronic Signature(s) Signed: 02/24/2015 8:05:15 AM By: Linton Ham MD Previous Signature: 02/23/2015 5:02:33 PM Version By: Becky Sax, Sallie RCP, RRT, CHT Entered By: Linton Ham on 02/24/2015 07:59:04 [name] ([id number]) -------------------------------------------------------------------------------- HBO Safety Checklist Details [name] Date of Service: 02/23/2015 1:30 PM Patient Name: J. Patient Account Number: [id number] Medical Record Treating RN: [id number] Number: Other Clinician: [name] Date of Birth/Sex: [birth year] (50 y.o. Male) Treating [name], [name] Primary Care Physician/Extender: Floyde Parkins Physician: Referring Physician: Sharilyn Sites in Treatment: 6 HBO Safety Checklist Items Safety Checklist Consent Form Signed Patient voided / foley secured and emptied When did you last eato 11:30 am Last dose of injectable or oral agent 11:30 am NA Ostomy pouch emptied and vented if applicable NA All implantable devices assessed, documented and approved Intravenous access site secured and place PICC line Valuables secured Linens and cotton and cotton/polyester blend (less than 51% polyester) Personal oil-based products / skin lotions / body lotions removed Wigs or hairpieces removed Smoking or tobacco materials removed Books / newspapers / magazines / loose paper removed Cologne, aftershave, perfume and deodorant removed Jewelry removed (may wrap wedding band) Make-up removed Hair care products removed Battery operated devices (external) removed Heating patches and chemical warmers removed NA Titanium eyewear removed NA Nail polish cured greater than 10 hours NA  Casting material cured greater than 10 hours NA Hearing aids removed NA Loose dentures or partials removed NA Prosthetics have been removed Patient demonstrates correct use of air break device (if applicable) Patient concerns have been addressed Patient grounding bracelet on and cord attached to chamber Specifics for Inpatients (complete in addition to above)  Medication sheet sent with patient DNAIEL, DEHRING ([id number]) Intravenous medications needed or due during therapy sent with patient Drainage tubes (e.g. nasogastric tube or chest tube secured and vented) Endotracheal or Tracheotomy tube secured Cuff deflated of air and inflated with saline Airway suctioned Electronic Signature(s) Signed: 02/23/2015 5:02:33 PM By: Lorine Bears RCP, RRT, CHT Entered By: Lorine Bears on 02/23/2015 12:53:40

## 2015-02-25 ENCOUNTER — Encounter: Payer: Managed Care, Other (non HMO) | Admitting: Surgery

## 2015-02-25 DIAGNOSIS — E11621 Type 2 diabetes mellitus with foot ulcer: Secondary | ICD-10-CM | POA: Diagnosis not present

## 2015-02-25 LAB — GLUCOSE, CAPILLARY
Glucose-Capillary: 143 mg/dL — ABNORMAL HIGH (ref 65–99)
Glucose-Capillary: 148 mg/dL — ABNORMAL HIGH (ref 65–99)

## 2015-02-25 NOTE — Progress Notes (Signed)
[name], [name] ([id number]) Visit Report for 02/24/2015 Arrival Information Details [name], [name] Date of Service: 02/24/2015 1:30 PM Patient Name: J. Patient Account Number: [id number] Medical Record Treating RN: [id number] Number: Other Clinician: [name] Date of Birth/Sex: 12-06-1965 (50 y.o. Male) Treating Dellia Nims, [place] Primary Care Physician/Extender: Floyde Parkins Physician: Referring Physician: Sharilyn Sites in Treatment: 6 Visit Information History Since Last Visit Added or deleted any medications: No Patient Arrived: Ambulatory Any new allergies or adverse reactions: No Arrival Time: 12:50 Had a fall or experienced change in No Accompanied By: self activities of daily living that may affect Transfer Assistance: None risk of falls: Patient Identification Verified: Yes Signs or symptoms of abuse/neglect No Secondary Verification Process Yes since last visito Completed: Hospitalized since last visit: No Patient Requires Transmission-Based No Has Dressing in Place as Prescribed: Yes Precautions: Has Footwear/Offloading in Place as Yes Patient Has Alerts: Yes Prescribed: Right: Surgical Shoe with Pressure Relief Insole Pain Present Now: No Electronic Signature(s) Signed: 02/24/2015 3:53:25 PM By: Lorine Bears RCP, RRT, CHT Entered By: Lorine Bears on 02/24/2015 13:02:48 [name] ([id number]) -------------------------------------------------------------------------------- Encounter Discharge Information Details [name] Date of Service: 02/24/2015 1:30 PM Patient Name: J. Patient Account Number: [id number] Medical Record Treating RN: [id number] Number: Other Clinician: [name] Date of Birth/Sex: Mar 25, 1965 (50 y.o. Male) Treating [name], [name] Primary Care Physician/Extender: Floyde Parkins Physician: Referring Physician: Sharilyn Sites in Treatment:  6 Encounter Discharge Information Items Discharge Pain Level: 0 Discharge Condition: Stable Ambulatory Status: Ambulatory Discharge Destination: Home Private Transportation: Auto Accompanied By: self Schedule Follow-up Appointment: No Medication Reconciliation completed and No provided to Patient/Care Angelin Cutrone: Clinical Summary of Care: Notes Patient has an HBO treatment scheduled on 02/25/15 at 13:00 pm. Electronic Signature(s) Signed: 02/24/2015 3:53:25 PM By: Lorine Bears RCP, RRT, CHT Entered By: Lorine Bears on 02/24/2015 15:12:24 [name] ([id number]) -------------------------------------------------------------------------------- Vitals Details [name] Date of Service: 02/24/2015 1:30 PM Patient Name: J. Patient Account Number: [id number] Medical Record Treating RN: [id number] Number: Other Clinician: [name] Date of Birth/Sex: 1965/08/07 (50 y.o. Male) Treating [name], [name] Primary Care Physician/Extender: Floyde Parkins Physician: Referring Physician: Sharilyn Sites in Treatment: 6 Vital Signs Time Taken: 12:52 Temperature (F): 98.0 Height (in): 74 Pulse (bpm): 78 Weight (lbs): 288 Respiratory Rate (breaths/min): 18 Body Mass Index (BMI): 37 Blood Pressure (mmHg): 122/78 Capillary Blood Glucose (mg/dl): 195 Reference Range: 80 - 120 mg / dl Electronic Signature(s) Signed: 02/24/2015 3:53:25 PM By: Lorine Bears RCP, RRT, CHT Entered By: Lorine Bears on 02/24/2015 13:03:16

## 2015-02-25 NOTE — Progress Notes (Signed)
[name] ([id number]) Visit Report for 02/24/2015 HBO Details [name] Date of Service: 02/24/2015 1:30 PM Patient Name: J. Patient Account Number: [id number] Medical Record Treating RN: [id number] Number: Other Clinician: Jacqulyn [name] Date of Birth/Sex: 12-07-1965 (50 y.o. Male) Treating [name] Primary Care Physician/Extender: [name] Physician: Referring Physician: Sharilyn [name] in Treatment: 6 HBO Treatment Course Details Treatment Course Ordering Physician: [name] 1 Number: HBO Treatment Start Date: 02/01/2015 Total Treatments 30 Ordered: HBO Indication: Diabetic Ulcer(s) of the Lower Extremity HBO Treatment Details Treatment Number: 17 Patient Type: Outpatient Chamber Type: Monoplace Chamber Serial #: [id number] Treatment Protocol: 2.0 ATA with 90 minutes oxygen, and no air breaks Treatment Details Compression Rate Down: 1.5 psi / minute De-Compression Rate Up: 1.5 psi / minute Air breaks and breathing Compress Tx Pressure periods Decompress Decompress Begins Reached (leave unused spaces Begins Ends blank) Chamber Pressure (ATA) 1 2 - - - - - - 2 1 Clock Time (24 hr) 13:02 13:14 - - - - - - 14:44 14:54 Treatment Length: 112 (minutes) Treatment Segments: 4 Capillary Blood Glucose Pre Capillary Blood Glucose (mg/dl): Post Capillary Blood Glucose (mg/dl): Vital Signs Capillary Blood Glucose Reference Range: 80 - 120 mg / dl HBO Diabetic Blood Glucose Intervention Range: <131 mg/dl or >249 mg/dl Time Vitals Blood Respiratory Capillary Blood Glucose Pulse Action Type: Pulse: Temperature: Taken: Pressure: Rate: Glucose (mg/dl): Meter #: Oximetry (%) Taken: Pre 12:52 122/78 78 18 98 195 1 none Post 14:58 116/64 78 18 98.4 164 1 none [name], [name] J. ([id number]) Treatment Response Treatment Completion Status: Treatment Completed without Adverse Event Physician Notes No concerns with treatment  given HBO Attestation I certify that I supervised this HBO treatment in accordance with Medicare guidelines. A trained Yes emergency response team is readily available per hospital policies and procedures. Continue HBOT as ordered. Yes Electronic Signature(s) Signed: 02/24/2015 5:19:28 PM By: [name] Ham MD Previous Signature: 02/24/2015 3:53:25 PM Version By: [name] Entered By: [name] on 02/24/2015 17:17:38 [name] ([id number]) -------------------------------------------------------------------------------- HBO Safety Checklist Details [name] Date of Service: 02/24/2015 1:30 PM Patient Name: J. Patient Account Number: [id number] Medical Record Treating RN: [id number] Number: Other Clinician: Jacqulyn [name] Date of Birth/Sex: 01-12-1965 (50 y.o. Male) Treating [name] Primary Care Physician/Extender: [name] Physician: Referring Physician: Sharilyn [name] in Treatment: 6 HBO Safety Checklist Items Safety Checklist Consent Form Signed Patient voided / foley secured and emptied When did you last eato 11:30 am Last dose of injectable or oral agent 11:30 am NA Ostomy pouch emptied and vented if applicable NA All implantable devices assessed, documented and approved Intravenous access site secured and place PICC line Valuables secured Linens and cotton and cotton/polyester blend (less than 51% polyester) Personal oil-based products / skin lotions / body lotions removed Wigs or hairpieces removed Smoking or tobacco materials removed Books / newspapers / magazines / loose paper removed Cologne, aftershave, perfume and deodorant removed Jewelry removed (may wrap wedding band) Make-up removed Hair care products removed Battery operated devices (external) removed Heating patches and chemical warmers removed NA Titanium eyewear removed NA Nail polish cured greater than 10 hours NA  Casting material cured greater than 10 hours NA Hearing aids removed NA Loose dentures or partials removed NA Prosthetics have been removed Patient demonstrates correct use of air break device (if applicable) Patient concerns have been addressed Patient grounding bracelet on and cord attached to chamber Specifics for Inpatients (complete in addition to above)  Medication sheet sent with patient [name] ([id number]) Intravenous medications needed or due during therapy sent with patient Drainage tubes (e.g. nasogastric tube or chest tube secured and vented) Endotracheal or Tracheotomy tube secured Cuff deflated of air and inflated with saline Airway suctioned Electronic Signature(s) Signed: 02/24/2015 3:53:25 PM By: Lorine Bears RCP, RRT, [name] Entered By: Lorine Bears on 02/24/2015 13:04:06

## 2015-02-26 ENCOUNTER — Ambulatory Visit: Payer: Managed Care, Other (non HMO) | Admitting: Surgery

## 2015-02-26 ENCOUNTER — Other Ambulatory Visit: Payer: Self-pay | Admitting: Infectious Diseases

## 2015-02-26 ENCOUNTER — Encounter: Payer: Managed Care, Other (non HMO) | Admitting: Surgery

## 2015-02-26 ENCOUNTER — Ambulatory Visit
Admission: RE | Admit: 2015-02-26 | Discharge: 2015-02-26 | Disposition: A | Discharge status: Home / Self Care | Payer: Managed Care, Other (non HMO) | Source: Ambulatory Visit | Attending: Infectious Diseases | Admitting: Infectious Diseases

## 2015-02-26 DIAGNOSIS — R600 Localized edema: Secondary | ICD-10-CM | POA: Diagnosis present

## 2015-02-26 DIAGNOSIS — M79661 Pain in right lower leg: Secondary | ICD-10-CM | POA: Insufficient documentation

## 2015-02-26 DIAGNOSIS — E11621 Type 2 diabetes mellitus with foot ulcer: Secondary | ICD-10-CM | POA: Diagnosis not present

## 2015-02-26 LAB — GLUCOSE, CAPILLARY
Glucose-Capillary: 213 mg/dL — ABNORMAL HIGH (ref 65–99)
Glucose-Capillary: 279 mg/dL — ABNORMAL HIGH (ref 65–99)
Glucose-Capillary: 200 mg/dL — ABNORMAL HIGH (ref 65–99)

## 2015-02-26 NOTE — Progress Notes (Signed)
[name], [name] ([id number]) Visit Report for 02/25/2015 HBO Details [name], [name] 02/25/2015 1:30 Patient Name: Date of Service: [name] Medical Record Patient Account Number: [id number] Number: Treating RN: Date of Birth/Sex: 1965/03/19 (50 y.o. Male) Other Clinician: [name] Primary Care Physician: [name] Treating [name], [name] Referring Physician: [name] Physician/Extender: Suella Grove in Treatment: 7 HBO Treatment Course Details Treatment Course Ordering Physician: Christin Fudge 1 Number: HBO Treatment Start Date: 02/01/2015 Total Treatments 30 Ordered: HBO Indication: Diabetic Ulcer(s) of the Lower Extremity HBO Treatment Details Treatment Number: 18 Patient Type: Outpatient Chamber Type: Monoplace Chamber Serial #: [id number] Treatment Protocol: 2.0 ATA with 90 minutes oxygen, and no air breaks Treatment Details Compression Rate Down: 1.5 psi / minute De-Compression Rate Up: 1.5 psi / minute Air breaks and breathing Compress Tx Pressure periods Decompress Decompress Begins Reached (leave unused spaces Begins Ends blank) Chamber Pressure (ATA) 1 2 - - - - - - 2 1 Clock Time (24 hr) 13:30 13:42 - - - - - - 15:12 15:23 Treatment Length: 113 (minutes) Treatment Segments: 4 Capillary Blood Glucose Pre Capillary Blood Glucose (mg/dl): Post Capillary Blood Glucose (mg/dl): Vital Signs Capillary Blood Glucose Reference Range: 80 - 120 mg / dl HBO Diabetic Blood Glucose Intervention Range: <131 mg/dl or >249 mg/dl Time Vitals Blood Respiratory Capillary Blood Glucose Pulse Action Type: Pulse: Temperature: Taken: Pressure: Rate: Glucose (mg/dl): Meter #: Oximetry (%) Taken: Pre 12:51 149/81 102 18 97.9 143 1 none Post 15:27 132/64 84 18 98.4 148 1 none [name], [name] J. ([id number]) Treatment Response Treatment Completion Status: Treatment Completed without Adverse Event HBO Attestation I certify that I supervised this  HBO treatment in accordance with Medicare guidelines. A trained Yes emergency response team is readily available per hospital policies and procedures. Continue HBOT as ordered. Yes Electronic Signature(s) Signed: 02/25/2015 3:47:26 PM By: Paulla Fore, RRT, CHT Signed: 02/25/2015 4:30:20 PM By: Christin Fudge MD, FACS Previous Signature: 02/25/2015 3:31:19 PM Version By: Christin Fudge MD, FACS Entered By: Lorine Bears on 02/25/2015 15:45:10 [name], [name] ([id number]) -------------------------------------------------------------------------------- HBO Safety Checklist Details [name], [name] 02/25/2015 1:30 Patient Name: Date of Service: [name] Medical Record Patient Account Number: [id number] Number: Treating RN: Date of Birth/Sex: 27-Oct-1965 (50 y.o. Male) Other Clinician: [name] Primary Care Physician: [name] Treating [name], [name] Referring Physician: [name] Physician/Extender: Suella Grove in Treatment: 7 HBO Safety Checklist Items Safety Checklist Consent Form Signed Patient voided / foley secured and emptied When did you last eato 10:00 am (Ensure at noon) Last dose of injectable or oral agent 10:00 am NA Ostomy pouch emptied and vented if applicable NA All implantable devices assessed, documented and approved Intravenous access site secured and place Valuables secured Linens and cotton and cotton/polyester blend (less than 51% polyester) Personal oil-based products / skin lotions / body lotions removed Wigs or hairpieces removed Smoking or tobacco materials removed Books / newspapers / magazines / loose paper removed Cologne, aftershave, perfume and deodorant removed Jewelry removed (may wrap wedding band) Make-up removed Hair care products removed Battery operated devices (external) removed Heating patches and chemical warmers removed NA Titanium eyewear removed NA Nail polish cured  greater than 10 hours NA Casting material cured greater than 10 hours NA Hearing aids removed NA Loose dentures or partials removed NA Prosthetics have been removed Patient demonstrates correct use of air break device (if applicable) Patient concerns have been addressed Patient grounding bracelet on and cord attached to chamber Specifics for Inpatients (complete in  addition to above) Medication sheet sent with patient [name], [name] ([id number]) Intravenous medications needed or due during therapy sent with patient Drainage tubes (e.g. nasogastric tube or chest tube secured and vented) Endotracheal or Tracheotomy tube secured Cuff deflated of air and inflated with saline Airway suctioned Electronic Signature(s) Signed: 02/25/2015 3:47:26 PM By: Lorine Bears RCP, RRT, CHT Entered By: Lorine Bears on 02/25/2015 13:34:12

## 2015-02-26 NOTE — Progress Notes (Signed)
[name], [name] ([id number]) Visit Report for 02/25/2015 Arrival Information Details [name], [name] 02/25/2015 1:30 Patient Name: Date of Service: [name] Medical Record Patient Account Number: [id number] Number: Treating RN: Date of Birth/Sex: 03-10-1965 (50 y.o. Male) Other Clinician: [name] Primary Care Physician: [name] Treating Britto, Errol Referring Physician: [name] Physician/Extender: Suella Grove in Treatment: 7 Visit Information History Since Last Visit Added or deleted any medications: No Patient Arrived: Ambulatory Any new allergies or adverse reactions: No Arrival Time: 13:25 Had a fall or experienced change in No Accompanied By: self activities of daily living that may affect Transfer Assistance: None risk of falls: Patient Identification Verified: Yes Signs or symptoms of abuse/neglect No Secondary Verification Process Yes since last visito Completed: Hospitalized since last visit: No Patient Requires Transmission-Based No Has Dressing in Place as Prescribed: Yes Precautions: Has Footwear/Offloading in Place as Yes Patient Has Alerts: Yes Prescribed: Left: Surgical Shoe with Pressure Relief Insole Pain Present Now: No Electronic Signature(s) Signed: 02/25/2015 3:47:26 PM By: Lorine Bears RCP, RRT, CHT Entered By: Lorine Bears on 02/25/2015 13:32:29 [name] ([id number]) -------------------------------------------------------------------------------- Encounter Discharge Information Details [name], [name] 02/25/2015 1:30 Patient Name: Date of Service: [name] Medical Record Patient Account Number: [id number] Number: Treating RN: Date of Birth/Sex: 1965-12-21 (50 y.o. Male) Other Clinician: [name] Primary Care Physician: [name] Treating Christin Fudge Referring Physician: [name] Physician/Extender: Suella Grove in Treatment:  7 Encounter Discharge Information Items Discharge Pain Level: 0 Discharge Condition: Stable Ambulatory Status: Ambulatory Discharge Destination: Home Private Transportation: Auto Accompanied By: self Schedule Follow-up Appointment: No Medication Reconciliation completed and No provided to Patient/Care Jakaree Pickard: Clinical Summary of Care: Notes Patient has an HBO treatment scheduled on 02/26/15 at 11:30 am. Electronic Signature(s) Signed: 02/25/2015 3:47:26 PM By: Lorine Bears RCP, RRT, CHT Entered By: Lorine Bears on 02/25/2015 15:46:22 [name], [name] ([id number]) -------------------------------------------------------------------------------- Vitals Details [name], [name] 02/25/2015 1:30 Patient Name: Date of Service: [name] Medical Record Patient Account Number: [id number] Number: Treating RN: Date of Birth/Sex: 06-15-1965 (50 y.o. Male) Other Clinician: [name] Primary Care Physician: [name] Treating Britto, Errol Referring Physician: [name] Physician/Extender: Suella Grove in Treatment: 7 Vital Signs Time Taken: 12:51 Temperature (F): 97.9 Height (in): 74 Pulse (bpm): 102 Weight (lbs): 288 Respiratory Rate (breaths/min): 18 Body Mass Index (BMI): 37 Blood Pressure (mmHg): 149/81 Capillary Blood Glucose (mg/dl): 143 Reference Range: 80 - 120 mg / dl Electronic Signature(s) Signed: 02/25/2015 3:47:26 PM By: Lorine Bears RCP, RRT, CHT Entered By: Lorine Bears on 02/25/2015 13:33:17

## 2015-02-27 LAB — WOUND CULTURE: Culture: NO GROWTH

## 2015-02-27 NOTE — Progress Notes (Signed)
[name], [name] ([id number]) Visit Report for 02/26/2015 Arrival Information Details [name], [name] 02/26/2015 11:30 Patient Name: Date of Service: [name] Patient Account Number: [id number] Number: Treating RN: Date of Birth/Sex: [birth year] (50 y.o. Male) Other Clinician: [name] Primary Care Physician: [name] Treating Britto, Errol Referring Physician: [name] Physician/Extender: Suella Grove in Treatment: 7 Visit Information History Since Last Visit Added or deleted any medications: No Patient Arrived: Ambulatory Any new allergies or adverse reactions: No Arrival Time: 11:25 Had a fall or experienced change in No Accompanied By: self activities of daily living that may affect Transfer Assistance: None risk of falls: Patient Identification Verified: Yes Signs or symptoms of abuse/neglect No Secondary Verification Process Yes since last visito Completed: Hospitalized since last visit: No Patient Requires Transmission-Based No Has Dressing in Place as Prescribed: Yes Precautions: Has Footwear/Offloading in Place as Yes Patient Has Alerts: Yes Prescribed: Right: Surgical Shoe with Pressure Relief Insole Pain Present Now: Yes Electronic Signature(s) Signed: 02/26/2015 2:06:30 PM By: Lorine Bears RCP, RRT, CHT Entered By: Becky Sax, Amado Nash on 02/26/2015 11:45:31 [name] ([id number]) -------------------------------------------------------------------------------- Encounter Discharge Information Details [name], [name] 02/26/2015 11:30 Patient Name: Date of Service: [name] Patient Account Number: [id number] Number: Treating RN: Date of Birth/Sex: Apr 17, 1965 (50 y.o. Male) Other Clinician: [name] Primary Care Physician: [name] Treating Christin Fudge Referring Physician: [name] Physician/Extender: Weeks in Treatment:  7 Encounter Discharge Information Items Discharge Pain Level: 0 Discharge Condition: Stable Ambulatory Status: Ambulatory Discharge Destination: Home Private Transportation: Auto Accompanied By: self Schedule Follow-up Appointment: No Medication Reconciliation completed and No provided to Patient/Care Namiah Dunnavant: Clinical Summary of Care: Notes Patient has an HBO treatment on 03/01/15 at 10:00 am. Electronic Signature(s) Signed: 02/26/2015 2:06:30 PM By: Lorine Bears RCP, RRT, CHT Entered By: Becky Sax, Amado Nash on 02/26/2015 14:04:01 [name] ([id number]) -------------------------------------------------------------------------------- Vitals Details [name], [name] 02/26/2015 11:30 Patient Name: Date of Service: [name] Patient Account Number: [id number] Number: Treating RN: Date of Birth/Sex: 1965/10/11 (50 y.o. Male) Other Clinician: [name] Primary Care Physician: [name] Treating Britto, Errol Referring Physician: [name] Physician/Extender: Weeks in Treatment: 7 Vital Signs Time Taken: 11:28 Temperature (F): 98.3 Height (in): 74 Pulse (bpm): 84 Weight (lbs): 288 Respiratory Rate (breaths/min): 18 Body Mass Index (BMI): 37 Blood Pressure (mmHg): 110/78 Capillary Blood Glucose (mg/dl): 279 Reference Range: 80 - 120 mg / dl Electronic Signature(s) Signed: 02/26/2015 2:06:30 PM By: Lorine Bears RCP, RRT, CHT Entered By: Lorine Bears on 02/26/2015 11:46:04

## 2015-02-27 NOTE — Progress Notes (Signed)
[name] ([id number]) Visit Report for 02/25/2015 Arrival Information Details Patient Name: [name]. Date of Service: 02/25/2015 12:45 PM Medical Record Number: [id number] Patient Account Number: [id number] Date of Birth/Sex: [birth year] (50 y.o. Male) Treating RN: Ahmed Prima Primary Care Physician: [name] Other Clinician: Referring Physician: [name] Treating Physician/Extender: Frann Rider in Treatment: 7 Visit Information History Since Last Visit All ordered tests and consults were completed: No Patient Arrived: Ambulatory Added or deleted any medications: No Arrival Time: 12:49 Any new allergies or adverse reactions: No Accompanied By: self Had a fall or experienced change in No Transfer Assistance: None activities of daily living that may affect Patient Identification Verified: Yes risk of falls: Secondary Verification Process Yes Signs or symptoms of abuse/neglect since last No Completed: visito Patient Requires Transmission-Based No Hospitalized since last visit: No Precautions: Pain Present Now: Yes Patient Has Alerts: Yes Electronic Signature(s) Signed: 02/26/2015 5:53:24 PM By: Alric Quan Entered By: Alric Quan on 02/25/2015 12:50:52 [name] ([id number]) -------------------------------------------------------------------------------- Clinic Level of Care Assessment Details Patient Name: [name] Date of Service: 02/25/2015 12:45 PM Medical Record Number: [id number] Patient Account Number: [id number] Date of Birth/Sex: 14-Mar-1965 (50 y.o. Male) Treating RN: Ahmed Prima Primary Care Physician: [name] Other Clinician: Referring Physician: [name] Treating Physician/Extender: Frann Rider in Treatment: 7 Clinic Level of Care Assessment Items TOOL 4 Quantity Score []  - Use when only an EandM is performed on FOLLOW-UP visit 0 ASSESSMENTS -  Nursing Assessment / Reassessment []  - Reassessment of Co-morbidities (includes updates in patient status) 0 X - Reassessment of Adherence to Treatment Plan 1 5 ASSESSMENTS - Wound and Skin Assessment / Reassessment []  - Simple Wound Assessment / Reassessment - one wound 0 X - Complex Wound Assessment / Reassessment - multiple wounds 2 5 []  - Dermatologic / Skin Assessment (not related to wound area) 0 ASSESSMENTS - Focused Assessment []  - Circumferential Edema Measurements - multi extremities 0 []  - Nutritional Assessment / Counseling / Intervention 0 []  - Lower Extremity Assessment (monofilament, tuning fork, pulses) 0 []  - Peripheral Arterial Disease Assessment (using hand held doppler) 0 ASSESSMENTS - Ostomy and/or Continence Assessment and Care []  - Incontinence Assessment and Management 0 []  - Ostomy Care Assessment and Management (repouching, etc.) 0 PROCESS - Coordination of Care X - Simple Patient / Family Education for ongoing care 1 15 []  - Complex (extensive) Patient / Family Education for ongoing care 0 []  - Staff obtains Programmer, systems, Records, Test Results / Process Orders 0 []  - Staff telephones HHA, Nursing Homes / Clarify orders / etc 0 []  - Routine Transfer to another Facility (non-emergent condition) 0 [name] ([id number]) []  - Routine Hospital Admission (non-emergent condition) 0 []  - New Admissions / Biomedical engineer / Ordering NPWT, Apligraf, etc. 0 []  - Emergency Hospital Admission (emergent condition) 0 X - Simple Discharge Coordination 1 10 []  - Complex (extensive) Discharge Coordination 0 PROCESS - Special Needs []  - Pediatric / Minor Patient Management 0 []  - Isolation Patient Management 0 []  - Hearing / Language / Visual special needs 0 []  - Assessment of Community assistance (transportation, D/C planning, etc.) 0 []  - Additional assistance / Altered mentation 0 []  - Support Surface(s) Assessment (bed, cushion, seat, etc.) 0 INTERVENTIONS  - Wound Cleansing / Measurement []  - Simple Wound Cleansing - one wound 0 X - Complex Wound Cleansing - multiple wounds 2 5 X - Wound Imaging (photographs - any number of wounds) 1 5 []  - Wound  Tracing (instead of photographs) 0 []  - Simple Wound Measurement - one wound 0 X - Complex Wound Measurement - multiple wounds 2 5 INTERVENTIONS - Wound Dressings X - Small Wound Dressing one or multiple wounds 2 10 []  - Medium Wound Dressing one or multiple wounds 0 []  - Large Wound Dressing one or multiple wounds 0 X - Application of Medications - topical 1 5 []  - Application of Medications - injection 0 INTERVENTIONS - Miscellaneous []  - External ear exam 0 [name] ([id number]) []  - Specimen Collection (cultures, biopsies, blood, body fluids, etc.) 0 []  - Specimen(s) / Culture(s) sent or taken to Lab for analysis 0 []  - Patient Transfer (multiple staff / Harrel Lemon Lift / Similar devices) 0 []  - Simple Staple / Suture removal (25 or less) 0 []  - Complex Staple / Suture removal (26 or more) 0 []  - Hypo / Hyperglycemic Management (close monitor of Blood Glucose) 0 []  - Ankle / Brachial Index (ABI) - do not check if billed separately 0 X - Vital Signs 1 5 Has the patient been seen at the hospital within the last three years: Yes Total Score: 95 Level Of Care: New/Established - Level 3 Electronic Signature(s) Signed: 02/26/2015 5:53:24 PM By: Alric Quan Entered By: Alric Quan on 02/25/2015 16:32:12 [name] ([id number]) -------------------------------------------------------------------------------- Encounter Discharge Information Details Patient Name: [name] Date of Service: 02/25/2015 12:45 PM Medical Record Number: [id number] Patient Account Number: [id number] Date of Birth/Sex: Nov 21, 1965 (50 y.o. Male) Treating RN: Ahmed Prima Primary Care Physician: [name] Other Clinician: Referring Physician: [name] Treating  Physician/Extender: Frann Rider in Treatment: 7 Encounter Discharge Information Items Discharge Pain Level: 0 Discharge Condition: Stable Ambulatory Status: Ambulatory Discharge Destination: Home Private Transportation: Auto Accompanied By: self Schedule Follow-up Appointment: Yes Medication Reconciliation completed and Yes provided to Patient/Care Jamaia Brum: Clinical Summary of Care: Electronic Signature(s) Signed: 02/26/2015 5:53:24 PM By: Alric Quan Entered By: Alric Quan on 02/25/2015 13:15:36 [name] ([id number]) -------------------------------------------------------------------------------- Lower Extremity Assessment Details Patient Name: [name] Date of Service: 02/25/2015 12:45 PM Medical Record Number: [id number] Patient Account Number: [id number] Date of Birth/Sex: Aug 21, 1965 (50 y.o. Male) Treating RN: Ahmed Prima Primary Care Physician: [name] Other Clinician: Referring Physician: [name] Treating Physician/Extender: Frann Rider in Treatment: 7 Vascular Assessment Pulses: Posterior Tibial Extremity colors, hair growth, and conditions: Extremity Color: [Right:Normal] Temperature of Extremity: [Right:Warm] Capillary Refill: [Right:< 3 seconds] Toe Nail Assessment Left: Right: Thick: No Discolored: No Deformed: No Improper Length and Hygiene: No Electronic Signature(s) Signed: 02/26/2015 5:53:24 PM By: Alric Quan Entered By: Alric Quan on 02/25/2015 13:00:42 [name] ([id number]) -------------------------------------------------------------------------------- Multi Wound Chart Details Patient Name: [name] Date of Service: 02/25/2015 12:45 PM Medical Record Number: [id number] Patient Account Number: [id number] Date of Birth/Sex: 02/07/1965 (50 y.o. Male) Treating RN: Ahmed Prima Primary Care Physician: [name] Other  Clinician: Referring Physician: [name] Treating Physician/Extender: Frann Rider in Treatment: 7 Vital Signs Height(in): 74 Capillary Blood 143 Glucose(mg/dl): Weight(lbs): 288 Pulse(bpm): Body Mass Index(BMI): 37 Blood Pressure Temperature(F): (mmHg): Respiratory Rate (breaths/min): Photos: [1:No Photos] [2:No Photos] [N/A:N/A] Wound Location: [1:Right Foot - Plantar] [2:Right Metatarsal head fifth N/A - Lateral] Wounding Event: [1:Gradually Appeared] [2:Bump] [N/A:N/A] Primary Etiology: [1:Diabetic Wound/Ulcer of the Lower Extremity] [2:Diabetic Wound/Ulcer of N/A the Lower Extremity] Comorbid History: [1:Hypertension, Type II Diabetes, Neuropathy] [2:Hypertension, Type II Diabetes, Neuropathy] [N/A:N/A] Date Acquired: [1:12/30/2014] [2:02/19/2015] [N/A:N/A] Weeks of Treatment: [1:7] [2:0] [N/A:N/A] Wound Status: [1:Open] [2:Open] [N/A:N/A] Measurements  L x W x D 0.9x0.9x1.5 [2:0.6x0.7x1.1] [N/A:N/A] (cm) Area (cm) : [1:0.636] [2:0.33] [N/A:N/A] Volume (cm) : [1:0.954] [2:0.363] [N/A:N/A] % Reduction in Area: [1:19.00%] [2:N/A] [N/A:N/A] % Reduction in Volume: -304.20% [2:N/A] [N/A:N/A] Classification: [1:Grade 3] [2:Grade 3] [N/A:N/A] Wagner Verification: [1:Abscess] [2:Abscess] [N/A:N/A] Exudate Amount: [1:Large] [2:Large] [N/A:N/A] Exudate Type: [1:Serosanguineous] [2:Purulent] [N/A:N/A] Exudate Color: [1:red, brown] [2:yellow, brown, green] [N/A:N/A] Wound Margin: [1:Distinct, outline attached] [2:Flat and Intact] [N/A:N/A] Granulation Amount: [1:Large (67-100%)] [2:Medium (34-66%)] [N/A:N/A] Granulation Quality: [1:Red, Pink] [2:Red] [N/A:N/A] Necrotic Amount: [1:None Present (0%)] [2:Medium (34-66%)] [N/A:N/A] Exposed Structures: [1:Fascia: No Fat: No Tendon: No Muscle: No] [2:Fascia: No Fat: No Tendon: No Muscle: No] [N/A:N/A] Joint: No Joint: No Bone: No Bone: No Limited to Skin Limited to Skin Breakdown Breakdown Epithelialization: None  None N/A Periwound Skin Texture: Callus: Yes Edema: Yes N/A Edema: No Excoriation: No Excoriation: No Induration: No Induration: No Callus: No Crepitus: No Crepitus: No Fluctuance: No Fluctuance: No Friable: No Friable: No Rash: No Rash: No Scarring: No Scarring: No Periwound Skin Moist: Yes Maceration: No N/A Moisture: Maceration: No Moist: No Dry/Scaly: No Dry/Scaly: No Periwound Skin Color: Atrophie Blanche: No Erythema: Yes N/A Cyanosis: No Atrophie Blanche: No Ecchymosis: No Cyanosis: No Erythema: No Ecchymosis: No Hemosiderin Staining: No Hemosiderin Staining: No Mottled: No Mottled: No Pallor: No Pallor: No Rubor: No Rubor: No Erythema Location: N/A Circumferential N/A Temperature: No Abnormality Hot N/A Tenderness on No Yes N/A Palpation: Wound Preparation: Ulcer Cleansing: Ulcer Cleansing: N/A Rinsed/Irrigated with Rinsed/Irrigated with Saline Saline Topical Anesthetic Topical Anesthetic Applied: Other: Lidocaine Applied: Other: lidocaine 4% 4% Treatment Notes Electronic Signature(s) Signed: 02/26/2015 5:53:24 PM By: Alric Quan Entered By: Alric Quan on 02/25/2015 13:01:02 [name] ([id number]) -------------------------------------------------------------------------------- [place] Details Patient Name: VEDAANT, JAGODA. Date of Service: 02/25/2015 12:45 PM Medical Record Number: [id number] Patient Account Number: [id number] Date of Birth/Sex: [birth year] (50 y.o. Male) Treating RN: Ahmed Prima Primary Care Physician: [name] Other Clinician: Referring Physician: [name] Treating Physician/Extender: Frann Rider in Treatment: 7 Active Inactive HBO Nursing Diagnoses: Anxiety related to feelings of confinement associated with the hyperbaric oxygen chamber Anxiety related to knowledge deficit of hyperbaric oxygen therapy and treatment procedures Discomfort related  to temperature and humidity changes inside hyperbaric chamber Potential for barotraumas to ears, sinuses, teeth, and lungs or cerebral gas embolism related to changes in atmospheric pressure inside hyperbaric oxygen chamber Potential for oxygen toxicity seizures related to delivery of 100% oxygen at an increased atmospheric pressure Potential for pulmonary oxygen toxicity related to delivery of 100% oxygen at an increased atmospheric pressure Goals: Barotrauma will be prevented during HBO2 Date Initiated: 01/07/2015 Goal Status: Active Patient and/or family will be able to state/discuss factors appropriate to the management of their disease process during treatment Date Initiated: 01/07/2015 Goal Status: Active Patient will tolerate the hyperbaric oxygen therapy treatment Date Initiated: 01/07/2015 Goal Status: Active Patient will tolerate the internal climate of the chamber Date Initiated: 01/07/2015 Goal Status: Active Patient/caregiver will verbalize understanding of HBO goals, rationale, procedures and potential hazards Date Initiated: 01/07/2015 Goal Status: Active Signs and symptoms of pulmonary oxygen toxicity will be recognized and promptly addressed Date Initiated: 01/07/2015 Goal Status: Active Signs and symptoms of seizure will be recognized and promptly addressed ; seizing patients will suffer no harm Date Initiated: 01/07/2015 [name] ([id number]) Goal Status: Active Interventions: Administer a five (5) minute air break for patient if signs and symptoms of seizure appear and notify the hyperbaric physician Administer a ten (10)  minute air break for patient if signs and symptoms of seizure appear and notify the hyperbaric physician Administer decongestants, per physician orders, prior to HBO2 Administer the correct therapeutic gas delivery based on the patients needs and limitations, per physician order Assess and provide for patientos comfort related to the  hyperbaric environment and equalization of middle ear Assess for signs and symptoms related to adverse events, including but not limited to confinement anxiety, pneumothorax, oxygen toxicity and baurotrauma Assess patient for any history of confinement anxiety Assess patient's knowledge and expectations regarding hyperbaric medicine and provide education related to the hyperbaric environment, goals of treatment and prevention of adverse events Implement protocols to decrease risk of pneumothorax in high risk patients Notes: Orientation to the Wound Care Program Nursing Diagnoses: Knowledge deficit related to the wound healing center program Goals: Patient/caregiver will verbalize understanding of the [place] Program Date Initiated: 01/07/2015 Goal Status: Active Interventions: Provide education on orientation to the wound center Notes: Peripheral Neuropathy Nursing Diagnoses: Knowledge deficit related to disease process and management of peripheral neurovascular dysfunction Potential alteration in peripheral tissue perfusion (select prior to confirmation of diagnosis) Goals: Patient/caregiver will verbalize understanding of disease process and disease management Date Initiated: 01/07/2015 Goal Status: Active CREDENCE, GIANNOTTI ([id number]) Interventions: Assess signs and symptoms of neuropathy upon admission and as needed Provide education on Management of Neuropathy and Related Ulcers Provide education on Management of Neuropathy upon discharge from the [place] for HBO Treatment Activities: Consult for HBO : 02/25/2015 Patient referred for customized footwear/offloading : 02/25/2015 Notes: Wound/Skin Impairment Nursing Diagnoses: Impaired tissue integrity Knowledge deficit related to ulceration/compromised skin integrity Goals: Patient/caregiver will verbalize understanding of skin care regimen Date Initiated: 01/07/2015 Goal Status: Active Ulcer/skin  breakdown will have a volume reduction of 30% by week 4 Date Initiated: 01/07/2015 Goal Status: Active Ulcer/skin breakdown will have a volume reduction of 50% by week 8 Date Initiated: 01/07/2015 Goal Status: Active Ulcer/skin breakdown will have a volume reduction of 80% by week 12 Date Initiated: 01/07/2015 Goal Status: Active Ulcer/skin breakdown will heal within 14 weeks Date Initiated: 01/07/2015 Goal Status: Active Interventions: Assess patient/caregiver ability to obtain necessary supplies Assess patient/caregiver ability to perform ulcer/skin care regimen upon admission and as needed Assess ulceration(s) every visit Provide education on ulcer and skin care Treatment Activities: Referred to DME Macayla Ekdahl for dressing supplies : 02/25/2015 Skin care regimen initiated : 02/25/2015 ALESANDRO, PETITHOMME ([id number]) Topical wound management initiated : 02/25/2015 Notes: Electronic Signature(s) Signed: 02/26/2015 5:53:24 PM By: Alric Quan Entered By: Alric Quan on 02/25/2015 13:00:53 [name] ([id number]) -------------------------------------------------------------------------------- Pain Assessment Details Patient Name: [name] Date of Service: 02/25/2015 12:45 PM Medical Record Number: [id number] Patient Account Number: [id number] Date of Birth/Sex: 1965/10/05 (50 y.o. Male) Treating RN: Ahmed Prima Primary Care Physician: [name] Other Clinician: Referring Physician: [name] Treating Physician/Extender: Frann Rider in Treatment: 7 Active Problems Location of Pain Severity and Description of Pain Patient Has Paino Yes Site Locations Pain Location: Pain in Ulcers With Dressing Change: Yes Duration of the Pain. Constant / Intermittento Constant Rate the pain. Current Pain Level: 7 Worst Pain Level: 7 Least Pain Level: 1 Character of Pain Describe the Pain: Sharp Pain Management and Medication Current Pain  Management: Electronic Signature(s) Signed: 02/26/2015 5:53:24 PM By: Alric Quan Entered By: Alric Quan on 02/25/2015 12:51:44 [name] ([id number]) -------------------------------------------------------------------------------- Patient/Caregiver Education Details Patient Name: [name] Date of Service: 02/25/2015 12:45 PM Medical Record Number: [id number] Patient  Account Number: [id number] Date of Birth/Gender: 1965-07-14 (50 y.o. Male) Treating RN: Carolyne Fiscal, Debi Primary Care Physician: [name] Other Clinician: Referring Physician: [name] Treating Physician/Extender: Frann Rider in Treatment: 7 Education Assessment Education Provided To: Patient Education Topics Provided Wound/Skin Impairment: Handouts: Other: change dressing as ordered Methods: Demonstration, Explain/Verbal Responses: State content correctly Electronic Signature(s) Signed: 02/26/2015 5:53:24 PM By: Alric Quan Entered By: Alric Quan on 02/25/2015 13:16:45 [name] ([id number]) -------------------------------------------------------------------------------- Wound Assessment Details Patient Name: [name] Date of Service: 02/25/2015 12:45 PM Medical Record Number: [id number] Patient Account Number: [id number] Date of Birth/Sex: 1965/09/11 (50 y.o. Male) Treating RN: Ahmed Prima Primary Care Physician: [name] Other Clinician: Referring Physician: [name] Treating Physician/Extender: Frann Rider in Treatment: 7 Wound Status Wound Number: 1 Primary Diabetic Wound/Ulcer of the Lower Etiology: Extremity Wound Location: Right Foot - Plantar Wound Status: Open Wounding Event: Gradually Appeared Comorbid Hypertension, Type II Diabetes, Date Acquired: 12/30/2014 History: Neuropathy Weeks Of Treatment: 7 Clustered Wound: No Photos Photo Uploaded By: Alric Quan on 02/25/2015  16:27:25 Wound Measurements Length: (cm) 0.9 Width: (cm) 0.9 Depth: (cm) 1.5 Area: (cm) 0.636 Volume: (cm) 0.954 % Reduction in Area: 19% % Reduction in Volume: -304.2% Epithelialization: None Tunneling: No Undermining: No Wound Description Classification: Grade 3 Wagner Verification: Abscess Wound Margin: Distinct, outline attached Exudate Amount: Large Exudate Type: Serosanguineous Exudate Color: red, brown Foul Odor After Cleansing: No Wound Bed Granulation Amount: Large (67-100%) Exposed Structure Granulation Quality: Red, Pink Fascia Exposed: No Necrotic Amount: None Present (0%) Fat Layer Exposed: No AMJAD, ZUPAN ([id number]) Tendon Exposed: No Muscle Exposed: No Joint Exposed: No Bone Exposed: No Limited to Skin Breakdown Periwound Skin Texture Texture Color No Abnormalities Noted: No No Abnormalities Noted: No Callus: Yes Atrophie Blanche: No Crepitus: No Cyanosis: No Excoriation: No Ecchymosis: No Fluctuance: No Erythema: No Friable: No Hemosiderin Staining: No Induration: No Mottled: No Localized Edema: No Pallor: No Rash: No Rubor: No Scarring: No Temperature / Pain Moisture Temperature: No Abnormality No Abnormalities Noted: No Dry / Scaly: No Maceration: No Moist: Yes Wound Preparation Ulcer Cleansing: Rinsed/Irrigated with Saline Topical Anesthetic Applied: Other: Lidocaine 4%, Treatment Notes Wound #1 (Right, Plantar Foot) 1. Cleansed with: Clean wound with Normal Saline 2. Anesthetic Topical Lidocaine 4% cream to wound bed prior to debridement 4. Dressing Applied: Iodoform packing Gauze 5. Secondary Dressing Applied Gauze and Kerlix/Conform 7. Secured with Recruitment consultant) Signed: 02/26/2015 5:53:24 PM By: Alric Quan Entered By: Alric Quan on 02/25/2015 12:56:40 [name] ([id number]) -------------------------------------------------------------------------------- Wound  Assessment Details Patient Name: [name] Date of Service: 02/25/2015 12:45 PM Medical Record Number: [id number] Patient Account Number: [id number] Date of Birth/Sex: 1965-05-02 (50 y.o. Male) Treating RN: Ahmed Prima Primary Care Physician: [name] Other Clinician: Referring Physician: [name] Treating Physician/Extender: Frann Rider in Treatment: 7 Wound Status Wound Number: 2 Primary Diabetic Wound/Ulcer of the Lower Etiology: Extremity Wound Location: Right Metatarsal head fifth - Lateral Wound Status: Open Wounding Event: Bump Comorbid Hypertension, Type II Diabetes, History: Neuropathy Date Acquired: 02/19/2015 Weeks Of Treatment: 0 Clustered Wound: No Photos Photo Uploaded By: Alric Quan on 02/25/2015 16:27:25 Wound Measurements Length: (cm) 0.6 Width: (cm) 0.7 Depth: (cm) 1.1 Area: (cm) 0.33 Volume: (cm) 0.363 % Reduction in Area: % Reduction in Volume: Epithelialization: None Tunneling: No Undermining: No Wound Description Classification: Grade 3 Wagner Verification: Abscess Wound Margin: Flat and Intact Exudate Amount: Large Exudate Type: Purulent Exudate Color: yellow, brown, green Foul Odor After Cleansing:  No Wound Bed Granulation Amount: Medium (34-66%) Exposed Structure Granulation Quality: Red Fascia Exposed: No JOSHUAH, FILKINS ([id number]) Necrotic Amount: Medium (34-66%) Fat Layer Exposed: No Necrotic Quality: Adherent Slough Tendon Exposed: No Muscle Exposed: No Joint Exposed: No Bone Exposed: No Limited to Skin Breakdown Periwound Skin Texture Texture Color No Abnormalities Noted: No No Abnormalities Noted: No Callus: No Atrophie Blanche: No Crepitus: No Cyanosis: No Excoriation: No Ecchymosis: No Fluctuance: No Erythema: Yes Friable: No Erythema Location: Circumferential Induration: No Hemosiderin Staining: No Localized Edema: Yes Mottled: No Rash: No Pallor:  No Scarring: No Rubor: No Moisture Temperature / Pain No Abnormalities Noted: No Temperature: Hot Dry / Scaly: No Tenderness on Palpation: Yes Maceration: No Moist: No Wound Preparation Ulcer Cleansing: Rinsed/Irrigated with Saline Topical Anesthetic Applied: Other: lidocaine 4%, Treatment Notes Wound #2 (Right, Lateral Metatarsal head fifth) 1. Cleansed with: Clean wound with Normal Saline 2. Anesthetic Topical Lidocaine 4% cream to wound bed prior to debridement 4. Dressing Applied: Iodoform packing Gauze 5. Secondary Dressing Applied Gauze and Kerlix/Conform 7. Secured with Recruitment consultant) Signed: 02/26/2015 5:53:24 PM By: Alric Quan Entered By: Alric Quan on 02/25/2015 12:57:32 KAMREN, RYKOWSKI ([id number], KOUPAL ([id number]) -------------------------------------------------------------------------------- Vitals Details Patient Name: [name] Date of Service: 02/25/2015 12:45 PM Medical Record Number: [id number] Patient Account Number: [id number] Date of Birth/Sex: Jun 11, 1965 (50 y.o. Male) Treating RN: Ahmed Prima Primary Care Physician: [name] Other Clinician: Referring Physician: [name] Treating Physician/Extender: Frann Rider in Treatment: 7 Vital Signs Time Taken: 12:51 Capillary Blood Glucose (mg/dl): 143 Height (in): 74 Reference Range: 80 - 120 mg / dl Weight (lbs): 288 Body Mass Index (BMI): 37 Electronic Signature(s) Signed: 02/26/2015 5:53:24 PM By: Alric Quan Entered By: Alric Quan on 02/25/2015 12:52:28

## 2015-02-27 NOTE — Progress Notes (Signed)
TREV, PACO ([id number]) Visit Report for 02/26/2015 HBO Details [name], [name] 02/26/2015 11:30 Patient Name: Date of Service: [name] Medical Record Patient Account Number: [id number] Number: Treating RN: Date of Birth/Sex: 1965-03-19 (50 y.o. Male) Other Clinician: [name] Primary Care Physician: [name] Treating Britto, Errol Referring Physician: [name] Physician/Extender: Suella Grove in Treatment: 7 HBO Treatment Course Details Treatment Course Ordering Physician: Christin Fudge 1 Number: HBO Treatment Start Date: 02/01/2015 Total Treatments 30 Ordered: HBO Indication: Diabetic Ulcer(s) of the Lower Extremity HBO Treatment Details Treatment Number: 19 Patient Type: Outpatient Chamber Type: Monoplace Chamber Serial #: [id number] Treatment Protocol: 2.0 ATA with 90 minutes oxygen, and no air breaks Treatment Details Compression Rate Down: 1.5 psi / minute De-Compression Rate Up: 1.5 psi / minute Air breaks and breathing Compress Tx Pressure periods Decompress Decompress Begins Reached (leave unused spaces Begins Ends blank) Chamber Pressure (ATA) 1 2 - - - - - - 2 1 Clock Time (24 hr) 11:43 11:55 - - - - - - 13:25 13:35 Treatment Length: 112 (minutes) Treatment Segments: 4 Capillary Blood Glucose Pre Capillary Blood Glucose (mg/dl): Post Capillary Blood Glucose (mg/dl): Vital Signs Capillary Blood Glucose Reference Range: 80 - 120 mg / dl HBO Diabetic Blood Glucose Intervention Range: <131 mg/dl or >249 mg/dl Time Vitals Blood Respiratory Capillary Blood Glucose Pulse Action Type: Pulse: Temperature: Taken: Pressure: Rate: Glucose (mg/dl): Meter #: Oximetry (%) Taken: Pre 11:28 110/78 84 18 98.3 279 1 none Post 13:50 126/76 72 18 98.4 200 1 none [name], [name] J. ([id number]) Treatment Response Treatment Completion Status: Treatment Completed without Adverse Event HBO Attestation I certify that I supervised this  HBO treatment in accordance with Medicare guidelines. A trained Yes emergency response team is readily available per hospital policies and procedures. Continue HBOT as ordered. Yes Electronic Signature(s) Signed: 02/26/2015 2:44:35 PM By: Christin Fudge MD, FACS Previous Signature: 02/26/2015 2:06:30 PM Version By: Lorine Bears RCP, RRT, CHT Entered By: Christin Fudge on 02/26/2015 14:44:34 [name] ([id number]) -------------------------------------------------------------------------------- HBO Safety Checklist Details [name], [name] 02/26/2015 11:30 Patient Name: Date of Service: [name] Medical Record Patient Account Number: [id number] Number: Treating RN: Date of Birth/Sex: 1965-04-27 (50 y.o. Male) Other Clinician: [name] Primary Care Physician: [name] Treating Britto, Errol Referring Physician: [name] Physician/Extender: Suella Grove in Treatment: 7 HBO Safety Checklist Items Safety Checklist Consent Form Signed Patient voided / foley secured and emptied When did you last eato 09:30 am Last dose of injectable or oral agent 09:30 am NA Ostomy pouch emptied and vented if applicable NA All implantable devices assessed, documented and approved Intravenous access site secured and place Valuables secured Linens and cotton and cotton/polyester blend (less than 51% polyester) Personal oil-based products / skin lotions / body lotions removed Wigs or hairpieces removed Smoking or tobacco materials removed Books / newspapers / magazines / loose paper removed Cologne, aftershave, perfume and deodorant removed Jewelry removed (may wrap wedding band) Make-up removed Hair care products removed Battery operated devices (external) removed Heating patches and chemical warmers removed NA Titanium eyewear removed NA Nail polish cured greater than 10 hours NA Casting material cured greater than 10 hours NA Hearing aids  removed NA Loose dentures or partials removed NA Prosthetics have been removed Patient demonstrates correct use of air break device (if applicable) Patient concerns have been addressed Patient grounding bracelet on and cord attached to chamber Specifics for Inpatients (complete in addition to above) Medication sheet sent with patient [name], [name] ([id number]) Intravenous  medications needed or due during therapy sent with patient Drainage tubes (e.g. nasogastric tube or chest tube secured and vented) Endotracheal or Tracheotomy tube secured Cuff deflated of air and inflated with saline Airway suctioned Electronic Signature(s) Signed: 02/26/2015 2:06:30 PM By: Lorine Bears RCP, RRT, CHT Entered By: Lorine Bears on 02/26/2015 11:47:09

## 2015-02-27 NOTE — Progress Notes (Signed)
[name], SIGLEY ([id number]) Visit Report for 02/25/2015 Chief Complaint Document Details KISEAN, ARCENEAUX 02/25/2015 12:45 Patient Name: Date of Service: [name] Medical Record Patient Account Number: [id number] Number: Treating RN: Ahmed Prima Date of Birth/Sex: 10-05-1965 (50 y.o. Male) Other Clinician: Primary Care Physician: [name] Treating Christin Fudge Referring Physician: [name] Physician/Extender: Weeks in Treatment: 7 Information Obtained from: Patient Chief Complaint Patients presents for treatment of an open diabetic ulcer on the plantar aspect of the right foot which she's had for about 4 weeks Electronic Signature(s) Signed: 02/25/2015 1:17:37 PM By: Christin Fudge MD, FACS Entered By: Christin Fudge on 02/25/2015 13:17:37 [name] ([id number]) -------------------------------------------------------------------------------- HPI Details [name], [name] 02/25/2015 12:45 Patient Name: Date of Service: [name] Medical Record Patient Account Number: [id number] Number: Treating RN: Ahmed Prima Date of Birth/Sex: 1965/11/05 (50 y.o. Male) Other Clinician: Primary Care Physician: [name] Treating Christin Fudge Referring Physician: [name] Physician/Extender: Weeks in Treatment: 7 History of Present Illness Location: plantar aspect of the right forefoot Quality: Patient reports No Pain. Severity: Patient states wound are getting worse. Duration: Patient has had the wound for < 4 weeks prior to presenting for treatment Timing: he has minimal discharge from the wound Context: The wound appeared gradually over time Modifying Factors: Other treatment(s) tried include:plan local care but not offloading Associated Signs and Symptoms: Patient reports having:no pain or discharge from the wound. HPI Description: This 50 year old male comes with an ulcerated area on the plantar aspect of the right  foot which she's had for approximately a month. I have known him from a previous visit at [facility] wound center and was treated in the months of April and May 2016 and rapidly healed a left plantar ulcer with a total contact cast. He has been a diabetic for about 16 years and tries to keep active and is fairly compliant with his diabetes management. 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 Past medical history significant for hypertension, hyperlipidemia, and status post appendectomy 1993. He does not smoke or drink alcohol. 01/14/2015 -- the patient had tolerated his total contact cast very well and had no problems and has had no systemic symptoms. However when his total contact cast was cut open he had excessive amount of purulent drainage in spite of being on antibiotics. He had had a recent x-ray done in the ER 12/22/2014 which showed IMPRESSION:No evidence of osseous erosion. Known soft tissue ulceration is not well characterized on radiograph. Scattered vascular calcifications seen. his last hemoglobin A1c in December was 7.3. He has been on Augmentin and doxycycline for the last 2 weeks. 01/21/2015 -- his culture grew rare growth of Pantoea species an MR moderate growth of Candida parapsilosis. it is sensitive to levofloxacin. He has not heard back from the insurance company regarding his hyperbaric oxygen therapy. His MRI has not been done yet and we will try and get him an earlier date 01/28/2015 -- MRI was done last night -- IMPRESSION:1. Soft tissue ulcer overlying the plantar aspect of the fifth metatarsal head extending to the cortex. Subcortical marrow edema in the fifth metatarsal head with corresponding T1 hypointensity is concerning for early osteomyelitis of the plantar lateral aspect of the fifth metatarsal head. Chest x-ray done on 01/14/2015 shows bronchiectatic changes without infiltrate. EKG done on generally 17 2017 shows a normal sinus rhythm and is a normal  EKG. KAIAN, [name] ([id number]) 02/04/2015 -- he was asked to see Dr. [name] last week and had 2 appointments but had  to cancel both due to pressures of work. Last night he has woken up with severe pain in the foot and leg and it is swollen up. No fever or no change in his blood glucose. Addendum: I spoke with Dr. [name] who kindly agreed to accept the patient for inpatient therapy and have also opened to the hospitalist Dr. [name], and discuss details of the management including PICC line and repeat cultures. 02/12/2015-- -- was seen by Dr. [name] in the hospital and a PICC line was placed. He was to receive Ceftazidime 2 g every 12 hourly, oral levofloxacin 750 mg every 24 hourly and oral fluconazole 200 mg daily. The antibiotics were to be given for 4 weeks except the Diflucan was to be given for the first 2 weeks. Reviewed note from 02/10/2015 -- and Dr. [name] had recommended management for growth of MSSA and Serratia. He switched him from ceftazidime to ceftriaxone 2 g every 24 hours. Levofloxacin was stopped and he would continue on fluconazole for another week. He had asked me to decide whether further imaging was necessary and whether surgical debridement of the infected bone was needed. He is doing well and has been off work for this week and we will keep him off the next week. 02/22/2015 -- he was seen by my colleague on 01/19/2015 and at that time an incision and drainage was done on his right lateral forefoot on the dorsum. Today when I probed this wound it is frankly draining pus and it communicates with the ulcer on the plantar aspect of his right foot. The patient is still on IV ceftriaxone 2 g every 24 hours and is to be seen by Dr. [name] on Friday. Electronic Signature(s) Signed: 02/25/2015 1:17:42 PM By: Christin Fudge MD, FACS Entered By: Christin Fudge on 02/25/2015 13:17:42 AZIZ, CHEAIRS  ([id number]) -------------------------------------------------------------------------------- Physical Exam Details KALEE, [name] 02/25/2015 12:45 Patient Name: Date of Service: [name] Medical Record Patient Account Number: [id number] Number: Treating RN: Ahmed Prima Date of Birth/Sex: 1965/07/19 (50 y.o. Male) Other Clinician: Primary Care Physician: [name] Treating Christin Fudge Referring Physician: [name] Physician/Extender: Weeks in Treatment: 7 Constitutional . Pulse regular. Respirations normal and unlabored. Afebrile. . Eyes Nonicteric. Reactive to light. Ears, Nose, Mouth, and Throat Lips, teeth, and gums WNL.Marland Kitchen Moist mucosa without lesions. Neck supple and nontender. No palpable supraclavicular or cervical adenopathy. Normal sized without goiter. Respiratory WNL. No retractions.. Cardiovascular Pedal Pulses WNL. No clubbing, cyanosis or edema. Lymphatic No adneopathy. No adenopathy. No adenopathy. Musculoskeletal Adexa without tenderness or enlargement.. Digits and nails w/o clubbing, cyanosis, infection, petechiae, ischemia, or inflammatory conditions.. Integumentary (Hair, Skin) No suspicious lesions. No crepitus or fluctuance. No peri-wound warmth or erythema. No masses.Marland Kitchen Psychiatric Judgement and insight Intact.. No evidence of depression, anxiety, or agitation.. Notes the 2 wounds on the dorsum and the plantar aspect now communicate freely and on deep pressure there is some seropurulent material which drains out. Overall there is no increase in his cellulitis or swelling. Electronic Signature(s) Signed: 02/25/2015 1:18:29 PM By: Christin Fudge MD, FACS Entered By: Christin Fudge on 02/25/2015 13:18:29 BOOMER, DODARO ([id number]) -------------------------------------------------------------------------------- Physician Orders Details CAVANAUGH, BUTZLAFF 02/25/2015 12:45 Patient Name: Date of Service: [name] Medical  Record Patient Account Number: [id number] Number: Treating RN: Ahmed Prima Date of Birth/Sex: 18-Dec-1965 (50 y.o. Male) Other Clinician: Primary Care Physician: [name] Treating Christin Fudge Referring Physician: [name] Physician/Extender: Suella Grove in Treatment: 7 Verbal / Phone Orders: Yes Clinician: Carolyne Fiscal, Debi Read Back and  Verified: Yes Diagnosis Coding Wound Cleansing Wound #1 Right,Plantar Foot o Cleanse wound with mild soap and water o May Shower, gently pat wound dry prior to applying new dressing. Wound #2 Right,Lateral Metatarsal head fifth o Cleanse wound with mild soap and water o May Shower, gently pat wound dry prior to applying new dressing. Anesthetic Wound #1 Right,Plantar Foot o Topical Lidocaine 4% cream applied to wound bed prior to debridement Wound #2 Right,Lateral Metatarsal head fifth o Topical Lidocaine 4% cream applied to wound bed prior to debridement Primary Wound Dressing Wound #1 Right,Plantar Foot o Iodoform packing Gauze Wound #2 Right,Lateral Metatarsal head fifth o Iodoform packing Gauze Secondary Dressing Wound #1 Right,Plantar Foot o Gauze and Kerlix/Conform Wound #2 Right,Lateral Metatarsal head fifth o Gauze and Kerlix/Conform Dressing Change Frequency Wound #1 Right,Plantar Foot o Change dressing every day. SEVAG, DIEBEL ([id number]) Wound #2 Right,Lateral Metatarsal head fifth o Change dressing every day. Follow-up Appointments Wound #1 Right,Plantar Foot o Return Appointment in 1 week. Wound #2 Right,Lateral Metatarsal head fifth o Return Appointment in 1 week. Off-Loading Wound #1 Right,Plantar Foot o Open toe surgical shoe with peg assist. - front offload Wound #2 Right,Lateral Metatarsal head fifth o Open toe surgical shoe with peg assist. - front offload Medications-please add to medication list. Wound #1 Right,Plantar Foot o Other: - continue IV  antibiotics Wound #2 Right,Lateral Metatarsal head fifth o Other: - continue IV antibiotics Electronic Signature(s) Signed: 02/25/2015 1:24:37 PM By: Christin Fudge MD, FACS Signed: 02/26/2015 5:53:24 PM By: Alric Quan Entered By: Alric Quan on 02/25/2015 13:14:14 [name] ([id number]) -------------------------------------------------------------------------------- Problem List Details [name], [name] 02/25/2015 12:45 Patient Name: Date of Service: [name] Medical Record Patient Account Number: [id number] Number: Treating RN: Ahmed Prima Date of Birth/Sex: 14-Apr-1965 (50 y.o. 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 Medical Record Patient Account Number: [id number] Number: Treating RN: Ahmed Prima Date of Birth/Sex: 02/20/1965 (50 y.o. Male) Other Clinician: Primary Care Physician: [name] Treating Williamson Cavanah Referring Physician: [name] Physician/Extender: Suella Grove in Treatment: 7 Subjective Chief  Complaint Information obtained from Patient Patients presents for treatment of an open diabetic ulcer on the plantar aspect of the right foot which she's had for about 4 weeks History of Present Illness (HPI) The following HPI elements were documented for the patient's wound: Location: plantar aspect of the right forefoot Quality: Patient reports No Pain. Severity: Patient states wound are getting worse. Duration: Patient has had the wound for < 4 weeks prior to presenting for treatment Timing: he has minimal discharge from the wound Context: The wound appeared gradually over time Modifying Factors: Other treatment(s) tried include:plan local care but not offloading Associated Signs and Symptoms: Patient reports having:no pain or discharge from the wound. This 50 year old male comes with an ulcerated area on the plantar aspect of the right foot  which she's had for approximately a month. I have known him from a previous visit at [facility] wound center and was treated in the months of April and May 2016 and rapidly healed a left plantar ulcer with a total contact cast. He has been a diabetic for about 16 years and tries to keep active and is fairly compliant with his diabetes management. 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 Past medical history significant for hypertension, hyperlipidemia, and status post appendectomy 1993. He does not smoke or drink alcohol. 01/14/2015 -- the patient had tolerated his total contact cast very well and had no problems and has had no systemic symptoms. However when his total contact cast was cut open he had excessive amount of purulent drainage in spite of being on antibiotics. He had had a recent x-ray done in the ER 12/22/2014 which showed IMPRESSION:No evidence of osseous erosion. Known soft tissue ulceration is not well characterized on radiograph. Scattered vascular calcifications seen. his last hemoglobin A1c in December was 7.3. He has been on  Augmentin and doxycycline for the last 2 weeks. 01/21/2015 -- his culture grew rare growth of Pantoea species an MR moderate growth of Candida parapsilosis. it is sensitive to levofloxacin. He has not heard back from the insurance company regarding his hyperbaric oxygen therapy. JUDITH, KEMPE ([id number]) His MRI has not been done yet and we will try and get him an earlier date 01/28/2015 -- MRI was done last night -- IMPRESSION:1. Soft tissue ulcer overlying the plantar aspect of the fifth metatarsal head extending to the cortex. Subcortical marrow edema in the fifth metatarsal head with corresponding T1 hypointensity is concerning for early osteomyelitis of the plantar lateral aspect of the fifth metatarsal head. Chest x-ray done on 01/14/2015 shows bronchiectatic changes without infiltrate. EKG done on generally 17 2017 shows a normal sinus rhythm and is a normal EKG. 02/04/2015 -- he was asked to see Dr. [name] last week and had 2 appointments but had to cancel both due to pressures of work. Last night he has woken up with severe pain in the foot and leg and it is swollen up. No fever or no change in his blood glucose. Addendum: I spoke with Dr. [name] who kindly agreed to accept the patient for inpatient therapy and have also opened to the hospitalist Dr. [name], and discuss details of the management including PICC line and repeat cultures. 02/12/2015-- -- was seen by Dr. [name] in the hospital and a PICC line was placed. He was to receive Ceftazidime 2 g every 12 hourly, oral levofloxacin 750 mg every 24 hourly and oral fluconazole 200 mg daily. The antibiotics were to be given for 4 weeks except the Diflucan was to be given for the first 2 weeks. Reviewed note from 02/10/2015 -- and Dr. [name] had recommended management for growth of MSSA and Serratia. He switched him from ceftazidime to ceftriaxone 2 g every 24 hours. Levofloxacin was stopped and he would continue  on fluconazole for another week. He had asked me to decide whether further imaging was necessary and whether surgical debridement of the infected bone was needed. He is doing well and has been off work for this week and we will keep him off the next week. 02/22/2015 -- he was seen by my colleague on 01/19/2015 and at that time an incision and drainage was done on his right lateral forefoot on the dorsum. Today when I probed this wound it is frankly draining pus and it communicates with the  ulcer on the plantar aspect of his right foot. The patient is still on IV ceftriaxone 2 g every 24 hours and is to be seen by Dr. [name] on Friday. Objective Constitutional Pulse regular. Respirations normal and unlabored. Afebrile. Vitals Time Taken: 12:51 PM, Height: 74 in, Weight: 288 lbs, BMI: 37, Capillary Blood Glucose: 143 mg/dl. Eyes Nonicteric. Reactive to light. Ears, Nose, Mouth, and Throat Lips, teeth, and gums WNL.Marland Kitchen Moist mucosa without lesions. Neck VAN, GAMARRA ([id number]) supple and nontender. No palpable supraclavicular or cervical adenopathy. Normal sized without goiter. Respiratory WNL. No retractions.. Cardiovascular Pedal Pulses WNL. No clubbing, cyanosis or edema. Lymphatic No adneopathy. No adenopathy. No adenopathy. Musculoskeletal Adexa without tenderness or enlargement.. Digits and nails w/o clubbing, cyanosis, infection, petechiae, ischemia, or inflammatory conditions.Marland Kitchen Psychiatric Judgement and insight Intact.. No evidence of depression, anxiety, or agitation.. General Notes: the 2 wounds on the dorsum and the plantar aspect now communicate freely and on deep pressure there is some seropurulent material which drains out. Overall there is no increase in his cellulitis or swelling. Integumentary (Hair, Skin) No suspicious lesions. No crepitus or fluctuance. No peri-wound warmth or erythema. No masses.. Wound #1 status is Open. Original cause of wound was  Gradually Appeared. The wound is located on the [place]. The wound measures 0.9cm length x 0.9cm width x 1.5cm depth; 0.636cm^2 area and 0.954cm^3 volume. The wound is limited to skin breakdown. There is no tunneling or undermining noted. There is a large amount of serosanguineous drainage noted. The wound margin is distinct with the outline attached to the wound base. There is large (67-100%) red, pink granulation within the wound bed. There is no necrotic tissue within the wound bed. The periwound skin appearance exhibited: Callus, Moist. The periwound skin appearance did not exhibit: Crepitus, Excoriation, Fluctuance, Friable, Induration, Localized Edema, Rash, Scarring, Dry/Scaly, Maceration, Atrophie Blanche, Cyanosis, Ecchymosis, Hemosiderin Staining, Mottled, Pallor, Rubor, Erythema. Periwound temperature was noted as No Abnormality. Wound #2 status is Open. Original cause of wound was Bump. The wound is located on the Right,Lateral Metatarsal head fifth. The wound measures 0.6cm length x 0.7cm width x 1.1cm depth; 0.33cm^2 area and 0.363cm^3 volume. The wound is limited to skin breakdown. There is no tunneling or undermining noted. There is a large amount of purulent drainage noted. The wound margin is flat and intact. There is medium (34-66%) red granulation within the wound bed. There is a medium (34-66%) amount of necrotic tissue within the wound bed including Adherent Slough. The periwound skin appearance exhibited: Localized Edema, Erythema. The periwound skin appearance did not exhibit: Callus, Crepitus, Excoriation, Fluctuance, Friable, Induration, Rash, Scarring, Dry/Scaly, Maceration, Moist, Atrophie Blanche, Cyanosis, Ecchymosis, Hemosiderin Staining, Mottled, Pallor, Rubor. The surrounding wound skin color is noted with erythema which is circumferential. Periwound temperature was noted as Hot. The periwound has tenderness on palpation. [name], [name]  ([id number]) Assessment Active Problems ICD-10 E11.621 - Type 2 diabetes mellitus with foot ulcer L97.512 - 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d L84 - Corns and callosities L02.611 - Cutaneous abscess of right foot M86.371 - Chronic multifocal osteomyelitis, right ankle and foot We will continue local care as before and he will see Dr. [name] first thing tomorrow morning. His culture report will be ready by then and we will have a phone conference and plan further therapy. He may need to see Dr. Mayo Ao for debridement is indicated. At present we're continuing aggressive local care, IV antibiotics and hyperbaric oxygen  therapy. Plan Wound Cleansing: Wound #1 Right,Plantar Foot: Cleanse wound with mild soap and water May Shower, gently pat wound dry prior to applying new dressing. Wound #2 Right,Lateral Metatarsal head fifth: Cleanse wound with mild soap and water May Shower, gently pat wound dry prior to applying new dressing. Anesthetic: Wound #1 Right,Plantar Foot: Topical Lidocaine 4% cream applied to wound bed prior to debridement Wound #2 Right,Lateral Metatarsal head fifth: Topical Lidocaine 4% cream applied to wound bed prior to debridement Primary Wound Dressing: Wound #1 Right,Plantar Foot: Iodoform packing Gauze Wound #2 Right,Lateral Metatarsal head fifth: Iodoform packing Gauze Secondary Dressing: Wound #1 Right,Plantar Foot: Gauze and Kerlix/Conform Wound #2 Right,Lateral Metatarsal head fifth: Gauze and Kerlix/Conform Dressing Change Frequency: Wound #1 Right,Plantar Foot: Change dressing every day. WENZEL, [name] ([id number]) Wound #2 Right,Lateral Metatarsal head fifth: Change dressing every day. Follow-up Appointments: Wound #1 Right,Plantar Foot: Return Appointment in 1 week. Wound #2 Right,Lateral Metatarsal head fifth: Return Appointment in 1 week. Off-Loading: Wound #1 Right,Plantar  Foot: Open toe surgical shoe with peg assist. - front offload Wound #2 Right,Lateral Metatarsal head fifth: Open toe surgical shoe with peg assist. - front offload Medications-please add to medication list.: Wound #1 Right,Plantar Foot: Other: - continue IV antibiotics Wound #2 Right,Lateral Metatarsal head fifth: Other: - continue IV antibiotics We will continue local care as before and he will see Dr. [name] first thing tomorrow morning. His culture report will be ready by then and we will have a phone conference and plan further therapy. He may need to see Dr. Mayo Ao for debridement is indicated. At present we're continuing aggressive local care, IV antibiotics and hyperbaric oxygen therapy. Electronic Signature(s) Signed: 02/25/2015 1:19:25 PM By: Christin Fudge MD, FACS Entered By: Christin Fudge on 02/25/2015 13:19:25 [name] ([id number]) -------------------------------------------------------------------------------- SuperBill Details Patient Name: [name] Date of Service: 02/25/2015 Medical Record Number: [id number] Patient Account Number: [id number] Date of Birth/Sex: Oct 17, 1965 (50 y.o. Male) Treating RN: Ahmed Prima Primary Care Physician: [name] Other Clinician: Referring Physician: [name] Treating Physician/Extender: Frann Rider in Treatment: 7 Diagnosis Coding ICD-10 Codes Code Description E11.621 Type 2 diabetes mellitus with foot ulcer L97.512 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d L84 Corns and callosities L02.611 Cutaneous abscess of right foot M86.371 Chronic multifocal osteomyelitis, right ankle and foot Facility Procedures CPT4 Code: [id number] Description: 99213 - WOUND CARE VISIT-LEV 3 EST PT Modifier: Quantity: 1 Physician Procedures CPT4 Code Description: [id number] - WC PHYS LEVEL 3 - EST PT ICD-10 Description Diagnosis E11.621 Type 2 diabetes  mellitus with foot ulcer L97.512 Non-pressure chronic ulcer of other part of right fo L84 Corns and callosities L02.611 Cutaneous abscess  of right foot Modifier: ot with fat lay Quantity: 1 er exposed Electronic Signature(s) Signed: 02/26/2015 4:56:18 PM By: Christin Fudge MD, FACS Signed: 02/26/2015 5:53:24 PM By: Alric Quan Previous Signature: 02/25/2015 1:19:45 PM Version By: Christin Fudge MD, FACS Entered By: Alric Quan on 02/25/2015 16:32:23

## 2015-03-01 ENCOUNTER — Encounter: Payer: Managed Care, Other (non HMO) | Admitting: Surgery

## 2015-03-01 DIAGNOSIS — E11621 Type 2 diabetes mellitus with foot ulcer: Secondary | ICD-10-CM | POA: Diagnosis not present

## 2015-03-01 LAB — GLUCOSE, CAPILLARY
Glucose-Capillary: 156 mg/dL — ABNORMAL HIGH (ref 65–99)
Glucose-Capillary: 123 mg/dL — ABNORMAL HIGH (ref 65–99)
Glucose-Capillary: 166 mg/dL — ABNORMAL HIGH (ref 65–99)

## 2015-03-02 ENCOUNTER — Encounter: Payer: Managed Care, Other (non HMO) | Admitting: Internal Medicine

## 2015-03-02 DIAGNOSIS — E11621 Type 2 diabetes mellitus with foot ulcer: Secondary | ICD-10-CM | POA: Diagnosis not present

## 2015-03-02 LAB — GLUCOSE, CAPILLARY
Glucose-Capillary: 144 mg/dL — ABNORMAL HIGH (ref 65–99)
Glucose-Capillary: 196 mg/dL — ABNORMAL HIGH (ref 65–99)

## 2015-03-02 NOTE — Progress Notes (Signed)
CLAIBORN, MCCALIP ([id number]) Visit Report for 03/01/2015 Arrival Information Details JERREL, KETCHERSIDE 03/01/2015 10:00 Patient Name: Date of Service: [name] Medical Record Patient Account Number: [id number] Number: Treating RN: Date of Birth/Sex: 31-Oct-1965 (50 y.o. Male) Other Clinician: [name] Primary Care Physician: [name] Treating Christin Fudge Referring Physician: [name] Physician/Extender: Suella Grove in Treatment: 7 Visit Information History Since Last Visit Added or deleted any medications: No Patient Arrived: Ambulatory Any new allergies or adverse reactions: No Arrival Time: 09:40 Had a fall or experienced change in No Accompanied By: self activities of daily living that may affect Transfer Assistance: None risk of falls: Patient Identification Verified: Yes Signs or symptoms of abuse/neglect No Secondary Verification Process Yes since last visito Completed: Hospitalized since last visit: No Patient Requires Transmission-Based No Has Dressing in Place as Prescribed: Yes Precautions: Has Footwear/Offloading in Place as Yes Patient Has Alerts: Yes Prescribed: Left: Surgical Shoe with Pressure Relief Insole Pain Present Now: Yes Electronic Signature(s) Signed: 03/01/2015 4:45:32 PM By: Lorine Bears RCP, RRT, CHT Entered By: Becky Sax, Amado Nash on 03/01/2015 09:57:32 [name] ([id number]) -------------------------------------------------------------------------------- Encounter Discharge Information Details JEREMI, KRESS 03/01/2015 10:00 Patient Name: Date of Service: [name] Medical Record Patient Account Number: [id number] Number: Treating RN: Date of Birth/Sex: 1965/04/19 (50 y.o. Male) Other Clinician: [name] Primary Care Physician: [name] Treating Christin Fudge Referring Physician: [name] Physician/Extender: Weeks in Treatment:  7 Encounter Discharge Information Items Discharge Pain Level: 0 Discharge Condition: Stable Ambulatory Status: Ambulatory Discharge Destination: Home Private Transportation: Auto Accompanied By: self Schedule Follow-up Appointment: No Medication Reconciliation completed and No provided to Patient/Care Sae Handrich: Clinical Summary of Care: Notes Patient has an HBO treatment scheduled on 03/02/15 at 13:00 pm. Electronic Signature(s) Signed: 03/01/2015 4:45:32 PM By: Lorine Bears RCP, RRT, CHT Entered By: Lorine Bears on 03/01/2015 12:22:41 RAIDEL, HANEY ([id number]) -------------------------------------------------------------------------------- Pain Assessment Details HARI, WIGGINGTON 03/01/2015 10:00 Patient Name: Date of Service: [name] Medical Record Patient Account Number: [id number] Number: Treating RN: Date of Birth/Sex: July 18, 1965 (50 y.o. Male) Other Clinician: [name] Primary Care Physician: [name] Treating Christin Fudge Referring Physician: [name] Physician/Extender: Weeks in Treatment: 7 Active Problems Location of Pain Severity and Description of Pain Patient Has Paino Yes Site Locations Pain Location: Pain in Ulcers Character of Pain Describe the Pain: Aching, Throbbing Pain Management and Medication Current Pain Management: Rest: Yes Electronic Signature(s) Signed: 03/01/2015 4:45:32 PM By: Lorine Bears RCP, RRT, CHT Entered By: Becky Sax, Amado Nash on 03/01/2015 09:57:27 [name] ([id number]) -------------------------------------------------------------------------------- Vitals Details Holik, Duard 03/01/2015 10:00 Patient Name: Date of Service: [name] Medical Record Patient Account Number: [id number] Number: Treating RN: Date of Birth/Sex: [birth year] (50 y.o. Male) Other Clinician: [name] Primary Care Physician:  [name] Treating Britto, Errol Referring Physician: [name] Physician/Extender: Weeks in Treatment: 7 Vital Signs Time Taken: 09:40 Temperature (F): 98.0 Height (in): 74 Pulse (bpm): 84 Weight (lbs): 288 Respiratory Rate (breaths/min): 18 Body Mass Index (BMI): 37 Blood Pressure (mmHg): 108/70 Capillary Blood Glucose (mg/dl): 156 Reference Range: 80 - 120 mg / dl Electronic Signature(s) Signed: 03/01/2015 4:45:32 PM By: Lorine Bears RCP, RRT, CHT Entered By: Becky Sax, Amado Nash on 03/01/2015 09:58:07

## 2015-03-02 NOTE — Progress Notes (Signed)
[name], [name] ([id number]) Visit Report for 03/01/2015 HBO Details NEEV, MCBURNIE 03/01/2015 10:00 Patient Name: Date of Service: [name] Medical Record Patient Account Number: [id number] Number: Treating RN: Date of Birth/Sex: [birth year] (50 y.o. Male) Other Clinician: [name] Primary Care Physician: [name] Treating Britto, Errol Referring Physician: [name] Physician/Extender: Suella Grove in Treatment: 7 HBO Treatment Course Details Treatment Course Ordering Physician: Christin Fudge 1 Number: HBO Treatment Start Date: 02/01/2015 Total Treatments 30 Ordered: HBO Indication: Diabetic Ulcer(s) of the Lower Extremity HBO Treatment Details Treatment Number: 20 Patient Type: Outpatient Chamber Type: Monoplace Chamber Serial #: [id number] Treatment Protocol: 2.0 ATA with 90 minutes oxygen, and no air breaks Treatment Details Compression Rate Down: 1.5 psi / minute De-Compression Rate Up: 1.5 psi / minute Air breaks and breathing Compress Tx Pressure periods Decompress Decompress Begins Reached (leave unused spaces Begins Ends blank) Chamber Pressure (ATA) 1 2 - - - - - - 2 1 Clock Time (24 hr) 10:15 10:27 - - - - - - 11:57 12:07 Treatment Length: 112 (minutes) Treatment Segments: 4 Capillary Blood Glucose Pre Capillary Blood Glucose (mg/dl): Post Capillary Blood Glucose (mg/dl): Vital Signs Capillary Blood Glucose Reference Range: 80 - 120 mg / dl HBO Diabetic Blood Glucose Intervention Range: <131 mg/dl or >249 mg/dl Time Vitals Blood Respiratory Capillary Blood Glucose Pulse Action Type: Pulse: Temperature: Taken: Pressure: Rate: Glucose (mg/dl): Meter #: Oximetry (%) Taken: Pre 09:40 108/70 84 18 98 156 1 none Post 12:12 122/72 84 18 98.4 123 1 none [name], [name] J. ([id number]) Treatment Response Treatment Completion Status: Treatment Completed without Adverse Event HBO Attestation I certify that I supervised this HBO  treatment in accordance with Medicare guidelines. A trained Yes emergency response team is readily available per hospital policies and procedures. Continue HBOT as ordered. Yes Electronic Signature(s) Signed: 03/01/2015 1:29:37 PM By: Christin Fudge MD, FACS Entered By: Christin Fudge on 03/01/2015 13:29:36 [name] ([id number]) -------------------------------------------------------------------------------- HBO Safety Checklist Details CHI, ILLESCAS 03/01/2015 10:00 Patient Name: Date of Service: [name] Medical Record Patient Account Number: [id number] Number: Treating RN: Date of Birth/Sex: [birth year] (50 y.o. Male) Other Clinician: [name] Primary Care Physician: [name] Treating Britto, Errol Referring Physician: [name] Physician/Extender: Suella Grove in Treatment: 7 HBO Safety Checklist Items Safety Checklist Consent Form Signed Patient voided / foley secured and emptied When did you last eato 07:30 [name] Last dose of injectable or oral agent 07:30 [name] NA Ostomy pouch emptied and vented if applicable NA All implantable devices assessed, documented and approved Intravenous access site secured and place PICC line Valuables secured Linens and cotton and cotton/polyester blend (less than 51% polyester) Personal oil-based products / skin lotions / body lotions removed Wigs or hairpieces removed Smoking or tobacco materials removed Books / newspapers / magazines / loose paper removed Cologne, aftershave, perfume and deodorant removed Jewelry removed (may wrap wedding band) Make-up removed Hair care products removed Battery operated devices (external) removed Heating patches and chemical warmers removed NA Titanium eyewear removed NA Nail polish cured greater than 10 hours NA Casting material cured greater than 10 hours NA Hearing aids removed NA Loose dentures or partials removed NA Prosthetics have been removed Patient  demonstrates correct use of air break device (if applicable) Patient concerns have been addressed Patient grounding bracelet on and cord attached to chamber Specifics for Inpatients (complete in addition to above) Medication sheet sent with patient [name], [name] ([id number]) Intravenous medications needed or due during therapy sent with patient Drainage tubes (  e.g. nasogastric tube or chest tube secured and vented) Endotracheal or Tracheotomy tube secured Cuff deflated of air and inflated with saline Airway suctioned Electronic Signature(s) Signed: 03/01/2015 4:45:32 PM By: Lorine Bears RCP, RRT, CHT Entered By: Lorine Bears on 03/01/2015 09:59:07

## 2015-03-03 ENCOUNTER — Encounter: Payer: Managed Care, Other (non HMO) | Attending: Internal Medicine | Admitting: Internal Medicine

## 2015-03-03 DIAGNOSIS — E785 Hyperlipidemia, unspecified: Secondary | ICD-10-CM | POA: Diagnosis not present

## 2015-03-03 DIAGNOSIS — E1140 Type 2 diabetes mellitus with diabetic neuropathy, unspecified: Secondary | ICD-10-CM | POA: Diagnosis not present

## 2015-03-03 DIAGNOSIS — Z794 Long term (current) use of insulin: Secondary | ICD-10-CM | POA: Diagnosis not present

## 2015-03-03 DIAGNOSIS — I10 Essential (primary) hypertension: Secondary | ICD-10-CM | POA: Diagnosis not present

## 2015-03-03 DIAGNOSIS — E11621 Type 2 diabetes mellitus with foot ulcer: Secondary | ICD-10-CM | POA: Insufficient documentation

## 2015-03-03 DIAGNOSIS — M86371 Chronic multifocal osteomyelitis, right ankle and foot: Secondary | ICD-10-CM | POA: Diagnosis not present

## 2015-03-03 DIAGNOSIS — L02611 Cutaneous abscess of right foot: Secondary | ICD-10-CM | POA: Diagnosis not present

## 2015-03-03 DIAGNOSIS — L97512 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d: Secondary | ICD-10-CM | POA: Diagnosis not present

## 2015-03-03 DIAGNOSIS — L84 Corns and callosities: Secondary | ICD-10-CM | POA: Diagnosis not present

## 2015-03-03 LAB — GLUCOSE, CAPILLARY
Glucose-Capillary: 217 mg/dL — ABNORMAL HIGH (ref 65–99)
Glucose-Capillary: 158 mg/dL — ABNORMAL HIGH (ref 65–99)

## 2015-03-03 NOTE — Progress Notes (Signed)
[name] ([id number]) Visit Report for 03/02/2015 Arrival Information Details [name] Date of Service: 03/02/2015 1:30 PM Patient Name: J. Patient Account Number: [id number] Medical Record Treating RN: [id number] Number: Other Clinician: [name] Date of Birth/Sex: 18-Dec-1965 (50 y.o. Male) Treating Dellia Nims, [place] Primary Care Physician/Extender: Floyde Parkins Physician: Referring Physician: Sharilyn Sites in Treatment: 7 Visit Information History Since Last Visit Added or deleted any medications: No Patient Arrived: Ambulatory Any new allergies or adverse reactions: No Arrival Time: 12:50 Had a fall or experienced change in No Accompanied By: self activities of daily living that may affect Transfer Assistance: None risk of falls: Patient Identification Verified: Yes Signs or symptoms of abuse/neglect No Secondary Verification Process Yes since last visito Completed: Hospitalized since last visit: No Patient Requires Transmission-Based No Has Dressing in Place as Prescribed: Yes Precautions: Has Footwear/Offloading in Place as Yes Patient Has Alerts: Yes Prescribed: Right: Surgical Shoe with Pressure Relief Insole Pain Present Now: No Electronic Signature(s) Signed: 03/02/2015 3:27:13 PM By: Lorine Bears RCP, RRT, CHT Entered By: Becky Sax, Amado Nash on 03/02/2015 13:19:58 [name] -------------------------------------------------------------------------------- Encounter Discharge Information Details [name] Date of Service: 03/02/2015 1:30 PM Patient Name: J. Patient Account Number: [id number] Medical Record Treating RN: [id number] Number: Other Clinician: [name] Date of Birth/Sex: 1965/06/30 (50 y.o. Male) Treating [name] Primary Care Physician/Extender: Floyde Parkins Physician: Referring Physician: Sharilyn Sites in Treatment:  7 Encounter Discharge Information Items Discharge Pain Level: 0 Discharge Condition: Stable Ambulatory Status: Ambulatory Discharge Destination: Home Private Transportation: Auto Accompanied By: self Schedule Follow-up Appointment: No Medication Reconciliation completed and No provided to Patient/Care Patsey Pitstick: Clinical Summary of Care: Notes Patient has an HBO treatment scheduled on 03/03/15 at 13:00 pm. Electronic Signature(s) Signed: 03/02/2015 3:27:13 PM By: Lorine Bears RCP, RRT, CHT Entered By: Becky Sax, Amado Nash on 03/02/2015 15:13:24 [name] -------------------------------------------------------------------------------- Vitals Details [name] Date of Service: 03/02/2015 1:30 PM Patient Name: J. Patient Account Number: [id number] Medical Record Treating RN: [id number] Number: Other Clinician: [name] Date of Birth/Sex: [birth year] (50 y.o. Male) Treating [name] Primary Care Physician/Extender: Floyde Parkins Physician: Referring Physician: Sharilyn Sites in Treatment: 7 Vital Signs Time Taken: 12:53 Temperature (F): 98.4 Height (in): 74 Pulse (bpm): 90 Weight (lbs): 288 Respiratory Rate (breaths/min): 18 Body Mass Index (BMI): 37 Blood Pressure (mmHg): 112/66 Capillary Blood Glucose (mg/dl): 144 Reference Range: 80 - 120 mg / dl Electronic Signature(s) Signed: 03/02/2015 3:27:13 PM By: Lorine Bears RCP, RRT, CHT Entered By: Lorine Bears on 03/02/2015 13:20:37

## 2015-03-03 NOTE — Progress Notes (Signed)
[name], [name] ([id number]) Visit Report for 03/02/2015 HBO Details [name] Date of Service: 03/02/2015 1:30 PM Patient Name: J. Patient Account Number: [id number] Medical Record Treating RN: [id number] Number: Other Clinician: [name] Date of Birth/Sex: [birth year] (50 y.o. Male) Treating [name] Primary Care Physician/Extender: Floyde Parkins Physician: Referring Physician: Sharilyn Sites in Treatment: 7 HBO Treatment Course Details Treatment Course Ordering Physician: Christin Fudge 1 Number: HBO Treatment Start Date: 02/01/2015 Total Treatments 30 Ordered: HBO Indication: Diabetic Ulcer(s) of the Lower Extremity HBO Treatment Details Treatment Number: 21 Patient Type: Outpatient Chamber Type: Monoplace Chamber Serial #: [id number] Treatment Protocol: 2.0 ATA with 90 minutes oxygen, and no air breaks Treatment Details Compression Rate Down: 1.5 psi / minute De-Compression Rate Up: 1.5 psi / minute Air breaks and breathing Compress Tx Pressure periods Decompress Decompress Begins Reached (leave unused spaces Begins Ends blank) Chamber Pressure (ATA) 1 2 - - - - - - 2 1 Clock Time (24 hr) 13:01 13:13 - - - - - - 14:43 14:53 Treatment Length: 112 (minutes) Treatment Segments: 4 Capillary Blood Glucose Pre Capillary Blood Glucose (mg/dl): Post Capillary Blood Glucose (mg/dl): Vital Signs Capillary Blood Glucose Reference Range: 80 - 120 mg / dl HBO Diabetic Blood Glucose Intervention Range: <131 mg/dl or >249 mg/dl Time Vitals Blood Respiratory Capillary Blood Glucose Pulse Action Type: Pulse: Temperature: Taken: Pressure: Rate: Glucose (mg/dl): Meter #: Oximetry (%) Taken: Pre 12:53 112/66 90 18 98.4 144 1 none Post 14:58 112/58 72 18 98.3 196 1 none [name], [name] ([id number]) Treatment Response Treatment Completion Status: Treatment Completed without Adverse Event Physician Notes No concerns expressed with  treatment given HBO Attestation I certify that I supervised this HBO treatment in accordance with Medicare guidelines. A trained Yes emergency response team is readily available per hospital policies and procedures. Continue HBOT as ordered. Yes Electronic Signature(s) Signed: 03/03/2015 8:09:37 AM By: Linton Ham MD Previous Signature: 03/02/2015 3:27:13 PM Version By: Becky Sax, Sallie RCP, RRT, CHT Entered By: Linton Ham on 03/03/2015 07:51:46 [name] ([id number]) -------------------------------------------------------------------------------- HBO Safety Checklist Details [name] Date of Service: 03/02/2015 1:30 PM Patient Name: J. Patient Account Number: [id number] Medical Record Treating RN: [id number] Number: Other Clinician: [name] Date of Birth/Sex: 01-13-1965 (50 y.o. Male) Treating [name] Primary Care Physician/Extender: Floyde Parkins Physician: Referring Physician: Sharilyn Sites in Treatment: 7 HBO Safety Checklist Items Safety Checklist Consent Form Signed Patient voided / foley secured and emptied When did you last eato 11:00 am (Ensure at noon) Last dose of injectable or oral agent 11:00 am NA Ostomy pouch emptied and vented if applicable NA All implantable devices assessed, documented and approved Intravenous access site secured and place Valuables secured Linens and cotton and cotton/polyester blend (less than 51% polyester) Personal oil-based products / skin lotions / body lotions removed Wigs or hairpieces removed Smoking or tobacco materials removed Books / newspapers / magazines / loose paper removed Cologne, aftershave, perfume and deodorant removed Jewelry removed (may wrap wedding band) Make-up removed Hair care products removed Battery operated devices (external) removed Heating patches and chemical warmers removed NA Titanium eyewear removed NA Nail polish cured greater  than 10 hours NA Casting material cured greater than 10 hours NA Hearing aids removed NA Loose dentures or partials removed NA Prosthetics have been removed Patient demonstrates correct use of air break device (if applicable) Patient concerns have been addressed Patient grounding bracelet on and cord attached to chamber Specifics for Inpatients (complete in addition  to above) Medication sheet sent with patient DIMARCO, HOLSTAD ([id number]) Intravenous medications needed or due during therapy sent with patient Drainage tubes (e.g. nasogastric tube or chest tube secured and vented) Endotracheal or Tracheotomy tube secured Cuff deflated of air and inflated with saline Airway suctioned Electronic Signature(s) Signed: 03/02/2015 3:27:13 PM By: Lorine Bears RCP, RRT, CHT Entered By: Lorine Bears on 03/02/2015 13:25:09

## 2015-03-04 ENCOUNTER — Ambulatory Visit (INDEPENDENT_AMBULATORY_CARE_PROVIDER_SITE_OTHER): Payer: Managed Care, Other (non HMO)

## 2015-03-04 ENCOUNTER — Other Ambulatory Visit: Payer: Self-pay

## 2015-03-04 ENCOUNTER — Inpatient Hospital Stay (HOSPITAL_COMMUNITY): Payer: Managed Care, Other (non HMO)

## 2015-03-04 ENCOUNTER — Inpatient Hospital Stay
Admission: AC | Admit: 2015-03-04 | Payer: Managed Care, Other (non HMO) | Source: Ambulatory Visit | Admitting: Family Medicine

## 2015-03-04 ENCOUNTER — Encounter: Payer: Self-pay | Admitting: Podiatry

## 2015-03-04 ENCOUNTER — Encounter (HOSPITAL_COMMUNITY): Payer: Self-pay | Admitting: Nurse Practitioner

## 2015-03-04 ENCOUNTER — Encounter (HOSPITAL_COMMUNITY)
Admission: EM | Disposition: A | Discharge status: Home Health | Payer: Self-pay | Source: Home / Self Care | Attending: Internal Medicine

## 2015-03-04 ENCOUNTER — Ambulatory Visit (INDEPENDENT_AMBULATORY_CARE_PROVIDER_SITE_OTHER): Payer: Managed Care, Other (non HMO) | Admitting: Podiatry

## 2015-03-04 ENCOUNTER — Inpatient Hospital Stay (HOSPITAL_COMMUNITY)
Admission: EM | Admit: 2015-03-04 | Discharge: 2015-03-11 | DRG: 617 | Disposition: A | Discharge status: Home Health | Payer: Managed Care, Other (non HMO) | Attending: Internal Medicine | Admitting: Internal Medicine

## 2015-03-04 ENCOUNTER — Emergency Department (HOSPITAL_COMMUNITY)
Admission: EM | Admit: 2015-03-04 | Discharge: 2015-03-04 | Disposition: A | Discharge status: Home / Self Care | Payer: Managed Care, Other (non HMO)

## 2015-03-04 ENCOUNTER — Encounter: Payer: Managed Care, Other (non HMO) | Admitting: Surgery

## 2015-03-04 ENCOUNTER — Encounter (HOSPITAL_COMMUNITY): Payer: Self-pay

## 2015-03-04 VITALS — BP 139/79 | HR 83 | Resp 18

## 2015-03-04 DIAGNOSIS — I10 Essential (primary) hypertension: Secondary | ICD-10-CM | POA: Diagnosis present

## 2015-03-04 DIAGNOSIS — L97519 Non-pressure chronic ulcer of other part of right foot with unspecified severity: Secondary | ICD-10-CM | POA: Diagnosis present

## 2015-03-04 DIAGNOSIS — L03115 Cellulitis of right lower limb: Secondary | ICD-10-CM | POA: Diagnosis not present

## 2015-03-04 DIAGNOSIS — R509 Fever, unspecified: Secondary | ICD-10-CM | POA: Diagnosis not present

## 2015-03-04 DIAGNOSIS — E1169 Type 2 diabetes mellitus with other specified complication: Secondary | ICD-10-CM | POA: Diagnosis present

## 2015-03-04 DIAGNOSIS — E785 Hyperlipidemia, unspecified: Secondary | ICD-10-CM | POA: Diagnosis present

## 2015-03-04 DIAGNOSIS — M869 Osteomyelitis, unspecified: Secondary | ICD-10-CM | POA: Diagnosis not present

## 2015-03-04 DIAGNOSIS — Z89421 Acquired absence of other right toe(s): Secondary | ICD-10-CM

## 2015-03-04 DIAGNOSIS — R52 Pain, unspecified: Secondary | ICD-10-CM

## 2015-03-04 DIAGNOSIS — L97509 Non-pressure chronic ulcer of other part of unspecified foot with unspecified severity: Secondary | ICD-10-CM | POA: Diagnosis not present

## 2015-03-04 DIAGNOSIS — B470 Eumycetoma: Secondary | ICD-10-CM

## 2015-03-04 DIAGNOSIS — E11621 Type 2 diabetes mellitus with foot ulcer: Secondary | ICD-10-CM | POA: Diagnosis present

## 2015-03-04 DIAGNOSIS — R112 Nausea with vomiting, unspecified: Secondary | ICD-10-CM | POA: Diagnosis not present

## 2015-03-04 DIAGNOSIS — R0602 Shortness of breath: Secondary | ICD-10-CM

## 2015-03-04 DIAGNOSIS — M86171 Other acute osteomyelitis, right ankle and foot: Secondary | ICD-10-CM | POA: Diagnosis present

## 2015-03-04 DIAGNOSIS — Z79899 Other long term (current) drug therapy: Secondary | ICD-10-CM | POA: Diagnosis not present

## 2015-03-04 DIAGNOSIS — Z7982 Long term (current) use of aspirin: Secondary | ICD-10-CM | POA: Diagnosis not present

## 2015-03-04 DIAGNOSIS — Z794 Long term (current) use of insulin: Secondary | ICD-10-CM | POA: Diagnosis not present

## 2015-03-04 DIAGNOSIS — E1159 Type 2 diabetes mellitus with other circulatory complications: Secondary | ICD-10-CM

## 2015-03-04 DIAGNOSIS — J101 Influenza due to other identified influenza virus with other respiratory manifestations: Secondary | ICD-10-CM | POA: Diagnosis present

## 2015-03-04 DIAGNOSIS — M86271 Subacute osteomyelitis, right ankle and foot: Secondary | ICD-10-CM | POA: Diagnosis not present

## 2015-03-04 DIAGNOSIS — E119 Type 2 diabetes mellitus without complications: Secondary | ICD-10-CM

## 2015-03-04 DIAGNOSIS — M79671 Pain in right foot: Secondary | ICD-10-CM | POA: Diagnosis present

## 2015-03-04 HISTORY — PX: I & D EXTREMITY: SHX5045

## 2015-03-04 HISTORY — PX: AMPUTATION: SHX166

## 2015-03-04 LAB — CBG MONITORING, ED: Glucose-Capillary: 97 mg/dL (ref 65–99)

## 2015-03-04 LAB — CBC
HCT: 43.8 % (ref 39.0–52.0)
Hemoglobin: 15 g/dL (ref 13.0–17.0)
MCH: 29.5 pg (ref 26.0–34.0)
MCHC: 34.2 g/dL (ref 30.0–36.0)
MCV: 86.2 fL (ref 78.0–100.0)
Platelets: 267 10*3/uL (ref 150–400)
RBC: 5.08 MIL/uL (ref 4.22–5.81)
RDW: 13.3 % (ref 11.5–15.5)
WBC: 13.2 10*3/uL — ABNORMAL HIGH (ref 4.0–10.5)

## 2015-03-04 LAB — COMPREHENSIVE METABOLIC PANEL
ALT: 18 U/L (ref 17–63)
AST: 24 U/L (ref 15–41)
Albumin: 3.3 g/dL — ABNORMAL LOW (ref 3.5–5.0)
Alkaline Phosphatase: 63 U/L (ref 38–126)
Anion gap: 15 (ref 5–15)
BUN: 27 mg/dL — ABNORMAL HIGH (ref 6–20)
CO2: 23 mmol/L (ref 22–32)
Calcium: 9.3 mg/dL (ref 8.9–10.3)
Chloride: 99 mmol/L — ABNORMAL LOW (ref 101–111)
Creatinine, Ser: 1.12 mg/dL (ref 0.61–1.24)
GFR calc Af Amer: >60 mL/min (ref >60)
GFR calc non Af Amer: >60 mL/min (ref >60)
Glucose, Bld: 194 mg/dL — ABNORMAL HIGH (ref 65–99)
Potassium: 4.8 mmol/L (ref 3.5–5.1)
Sodium: 137 mmol/L (ref 135–145)
Total Bilirubin: 0.6 mg/dL (ref 0.3–1.2)
Total Protein: 7.4 g/dL (ref 6.5–8.1)

## 2015-03-04 LAB — GLUCOSE, CAPILLARY
Glucose-Capillary: 73 mg/dL (ref 65–99)
Glucose-Capillary: 183 mg/dL — ABNORMAL HIGH (ref 65–99)

## 2015-03-04 LAB — LACTIC ACID, PLASMA: Lactic Acid, Venous: 1.3 mmol/L (ref 0.5–2.0)

## 2015-03-04 LAB — APTT: aPTT: 30 seconds (ref 24–37)

## 2015-03-04 LAB — PROTIME-INR
INR: 1.09 (ref 0.00–1.49)
Prothrombin Time: 14.3 seconds (ref 11.6–15.2)

## 2015-03-04 SURGERY — IRRIGATION AND DEBRIDEMENT EXTREMITY
Anesthesia: General | Site: Foot | Laterality: Right

## 2015-03-04 MED ORDER — PROPOFOL 10 MG/ML IV BOLUS
INTRAVENOUS | Status: AC
  Filled 2015-03-04: qty 40

## 2015-03-04 MED ORDER — TRAMADOL HCL 50 MG PO TABS
50.0000 mg | ORAL_TABLET | Freq: Four times a day (QID) | ORAL | Status: DC | PRN
  Administered 2015-03-06 (×2): 50 mg via ORAL
  Filled 2015-03-04 (×3): qty 1

## 2015-03-04 MED ORDER — MIDAZOLAM HCL 5 MG/5ML IJ SOLN
INTRAMUSCULAR | Status: DC | PRN
  Administered 2015-03-04: 2 mg via INTRAVENOUS

## 2015-03-04 MED ORDER — PROPOFOL 10 MG/ML IV BOLUS
INTRAVENOUS | Status: DC | PRN
  Administered 2015-03-04: 200 mg via INTRAVENOUS

## 2015-03-04 MED ORDER — ROCURONIUM BROMIDE 50 MG/5ML IV SOLN
INTRAVENOUS | Status: AC
  Filled 2015-03-04: qty 1

## 2015-03-04 MED ORDER — LACTATED RINGERS IV SOLN
INTRAVENOUS | Status: DC | PRN
  Administered 2015-03-04: 19:00:00 via INTRAVENOUS

## 2015-03-04 MED ORDER — FENTANYL CITRATE (PF) 100 MCG/2ML IJ SOLN
INTRAMUSCULAR | Status: DC | PRN
  Administered 2015-03-04: 50 ug via INTRAVENOUS
  Administered 2015-03-04: 100 ug via INTRAVENOUS

## 2015-03-04 MED ORDER — 0.9 % SODIUM CHLORIDE (POUR BTL) OPTIME
TOPICAL | Status: DC | PRN
  Administered 2015-03-04: 1000 mL

## 2015-03-04 MED ORDER — INSULIN ASPART 100 UNIT/ML ~~LOC~~ SOLN: 0.0000 [IU] | Freq: Every day | SUBCUTANEOUS | Status: DC

## 2015-03-04 MED ORDER — OXYCODONE-ACETAMINOPHEN 5-325 MG PO TABS
1.0000 | ORAL_TABLET | Freq: Four times a day (QID) | ORAL | Status: DC | PRN
  Administered 2015-03-05 – 2015-03-07 (×6): 1 via ORAL
  Filled 2015-03-04 (×7): qty 1

## 2015-03-04 MED ORDER — OXYCODONE HCL 5 MG/5ML PO SOLN: 5.0000 mg | Freq: Once | ORAL | Status: DC | PRN

## 2015-03-04 MED ORDER — IRBESARTAN 300 MG PO TABS
300.0000 mg | ORAL_TABLET | Freq: Every day | ORAL | Status: DC
  Administered 2015-03-05 – 2015-03-11 (×7): 300 mg via ORAL
  Filled 2015-03-04 (×7): qty 1

## 2015-03-04 MED ORDER — SUCCINYLCHOLINE CHLORIDE 20 MG/ML IJ SOLN
INTRAMUSCULAR | Status: DC | PRN
  Administered 2015-03-04: 100 mg via INTRAVENOUS

## 2015-03-04 MED ORDER — LIDOCAINE HCL (PF) 1 % IJ SOLN
INTRAMUSCULAR | Status: AC
  Filled 2015-03-04: qty 30

## 2015-03-04 MED ORDER — INSULIN ASPART 100 UNIT/ML ~~LOC~~ SOLN
0.0000 [IU] | Freq: Three times a day (TID) | SUBCUTANEOUS | Status: DC
  Administered 2015-03-05: 5 [IU] via SUBCUTANEOUS
  Administered 2015-03-06: 2 [IU] via SUBCUTANEOUS
  Administered 2015-03-06: 3 [IU] via SUBCUTANEOUS
  Administered 2015-03-07 – 2015-03-11 (×6): 2 [IU] via SUBCUTANEOUS

## 2015-03-04 MED ORDER — LIDOCAINE HCL (CARDIAC) 20 MG/ML IV SOLN
INTRAVENOUS | Status: AC
  Filled 2015-03-04: qty 5

## 2015-03-04 MED ORDER — METHOCARBAMOL 500 MG PO TABS
500.0000 mg | ORAL_TABLET | Freq: Two times a day (BID) | ORAL | Status: DC
  Administered 2015-03-04 – 2015-03-11 (×14): 500 mg via ORAL
  Filled 2015-03-04 (×14): qty 1

## 2015-03-04 MED ORDER — PHENYLEPHRINE HCL 10 MG/ML IJ SOLN
INTRAMUSCULAR | Status: DC | PRN
  Administered 2015-03-04: 80 ug via INTRAVENOUS
  Administered 2015-03-04: 120 ug via INTRAVENOUS

## 2015-03-04 MED ORDER — LIDOCAINE HCL (CARDIAC) 20 MG/ML IV SOLN
INTRAVENOUS | Status: DC | PRN
  Administered 2015-03-04: 100 mg via INTRAVENOUS

## 2015-03-04 MED ORDER — SODIUM CHLORIDE 0.9 % IR SOLN
Status: DC | PRN
  Administered 2015-03-04: 3000 mL

## 2015-03-04 MED ORDER — MIDAZOLAM HCL 2 MG/2ML IJ SOLN
INTRAMUSCULAR | Status: AC
  Filled 2015-03-04: qty 2

## 2015-03-04 MED ORDER — POLYMYXIN B SULFATE 500000 UNITS IJ SOLR
INTRAMUSCULAR | Status: DC | PRN
  Administered 2015-03-04: 500 mL

## 2015-03-04 MED ORDER — ONDANSETRON HCL 4 MG/2ML IJ SOLN
INTRAMUSCULAR | Status: AC
  Filled 2015-03-04: qty 2

## 2015-03-04 MED ORDER — ONDANSETRON HCL 4 MG/2ML IJ SOLN
4.0000 mg | Freq: Four times a day (QID) | INTRAMUSCULAR | Status: DC | PRN
  Administered 2015-03-07 – 2015-03-08 (×2): 4 mg via INTRAVENOUS
  Filled 2015-03-04 (×2): qty 2

## 2015-03-04 MED ORDER — HYDROCHLOROTHIAZIDE 25 MG PO TABS
25.0000 mg | ORAL_TABLET | Freq: Every day | ORAL | Status: DC
  Administered 2015-03-05 – 2015-03-11 (×7): 25 mg via ORAL
  Filled 2015-03-04 (×7): qty 1

## 2015-03-04 MED ORDER — OXYCODONE HCL 5 MG PO TABS: 5.0000 mg | ORAL_TABLET | Freq: Once | ORAL | Status: DC | PRN

## 2015-03-04 MED ORDER — ONDANSETRON HCL 4 MG PO TABS: 4.0000 mg | ORAL_TABLET | Freq: Four times a day (QID) | ORAL | Status: DC | PRN

## 2015-03-04 MED ORDER — LIDOCAINE HCL (PF) 1 % IJ SOLN
INTRAMUSCULAR | Status: AC
Start: 2015-03-04 — End: 2015-03-04
  Filled 2015-03-04: qty 30

## 2015-03-04 MED ORDER — BUPIVACAINE HCL (PF) 0.5 % IJ SOLN
INTRAMUSCULAR | Status: AC
  Filled 2015-03-04: qty 30

## 2015-03-04 MED ORDER — PROMETHAZINE HCL 25 MG/ML IJ SOLN: 6.2500 mg | INTRAMUSCULAR | Status: DC | PRN

## 2015-03-04 MED ORDER — FENTANYL CITRATE (PF) 250 MCG/5ML IJ SOLN
INTRAMUSCULAR | Status: AC
  Filled 2015-03-04: qty 5

## 2015-03-04 MED ORDER — DEXTROSE 5 % IV SOLN
1.0000 g | INTRAVENOUS | Status: DC
  Administered 2015-03-04: 1 g via INTRAVENOUS
  Filled 2015-03-04: qty 10

## 2015-03-04 MED ORDER — HYDRALAZINE HCL 20 MG/ML IJ SOLN: 5.0000 mg | INTRAMUSCULAR | Status: DC | PRN

## 2015-03-04 MED ORDER — OLMESARTAN-AMLODIPINE-HCTZ 40-10-25 MG PO TABS: 1.0000 | ORAL_TABLET | Freq: Every day | ORAL | Status: DC

## 2015-03-04 MED ORDER — AMLODIPINE BESYLATE 10 MG PO TABS
10.0000 mg | ORAL_TABLET | Freq: Every day | ORAL | Status: DC
  Administered 2015-03-05 – 2015-03-11 (×7): 10 mg via ORAL
  Filled 2015-03-04 (×7): qty 1

## 2015-03-04 MED ORDER — HYDROMORPHONE HCL 1 MG/ML IJ SOLN: 0.2500 mg | INTRAMUSCULAR | Status: DC | PRN

## 2015-03-04 MED ORDER — DEXTROSE 5 % IV SOLN
2.0000 g | INTRAVENOUS | Status: DC
  Filled 2015-03-04: qty 2

## 2015-03-04 MED ORDER — LEVOFLOXACIN 500 MG PO TABS: 750.0000 mg | ORAL_TABLET | ORAL | Status: DC

## 2015-03-04 MED ORDER — ASPIRIN EC 81 MG PO TBEC
81.0000 mg | DELAYED_RELEASE_TABLET | Freq: Every day | ORAL | Status: DC
  Administered 2015-03-04 – 2015-03-11 (×8): 81 mg via ORAL
  Filled 2015-03-04 (×9): qty 1

## 2015-03-04 MED ORDER — LEVOFLOXACIN 500 MG PO TABS
750.0000 mg | ORAL_TABLET | Freq: Every day | ORAL | Status: DC
  Administered 2015-03-04: 750 mg via ORAL
  Filled 2015-03-04: qty 1

## 2015-03-04 SURGICAL SUPPLY — 48 items
BANDAGE ACE 4X5 VEL STRL LF (GAUZE/BANDAGES/DRESSINGS) ×2 IMPLANT
BLADE AVERAGE 25MMX9MM (BLADE) ×1
BLADE AVERAGE 25X9 (BLADE) ×1 IMPLANT
BLADE SAW SGTL MED 73X18.5 STR (BLADE) IMPLANT
BLADE SURG 10 STRL SS (BLADE) IMPLANT
BLADE SURG 21 STRL SS (BLADE) ×3 IMPLANT
BNDG COHESIVE 4X5 TAN STRL (GAUZE/BANDAGES/DRESSINGS) ×3 IMPLANT
BNDG COHESIVE 6X5 TAN STRL LF (GAUZE/BANDAGES/DRESSINGS) IMPLANT
BNDG GAUZE ELAST 4 BULKY (GAUZE/BANDAGES/DRESSINGS) ×4 IMPLANT
COVER SURGICAL LIGHT HANDLE (MISCELLANEOUS) ×6 IMPLANT
DRAPE U-SHAPE 47X51 STRL (DRAPES) ×6 IMPLANT
DRSG ADAPTIC 3X8 NADH LF (GAUZE/BANDAGES/DRESSINGS) ×3 IMPLANT
DRSG PAD ABDOMINAL 8X10 ST (GAUZE/BANDAGES/DRESSINGS) ×4 IMPLANT
DURAPREP 26ML APPLICATOR (WOUND CARE) ×3 IMPLANT
ELECT CAUTERY BLADE 6.4 (BLADE) IMPLANT
ELECT REM PT RETURN 9FT ADLT (ELECTROSURGICAL) ×3
ELECTRODE REM PT RTRN 9FT ADLT (ELECTROSURGICAL) ×1 IMPLANT
GAUZE PACKING FOLDED 1/2 STR (GAUZE/BANDAGES/DRESSINGS) ×2 IMPLANT
GAUZE SPONGE 4X4 12PLY STRL (GAUZE/BANDAGES/DRESSINGS) ×3 IMPLANT
GAUZE XEROFORM 5X9 LF (GAUZE/BANDAGES/DRESSINGS) ×2 IMPLANT
GLOVE BIOGEL PI IND STRL 9 (GLOVE) ×1 IMPLANT
GLOVE BIOGEL PI INDICATOR 9 (GLOVE) ×2
GLOVE SURG ORTHO 9.0 STRL STRW (GLOVE) ×3 IMPLANT
GOWN STRL REUS W/ TWL LRG LVL3 (GOWN DISPOSABLE) ×1 IMPLANT
GOWN STRL REUS W/ TWL XL LVL3 (GOWN DISPOSABLE) ×2 IMPLANT
GOWN STRL REUS W/TWL LRG LVL3 (GOWN DISPOSABLE) ×3
GOWN STRL REUS W/TWL XL LVL3 (GOWN DISPOSABLE) ×6
HANDPIECE INTERPULSE COAX TIP (DISPOSABLE)
KIT BASIN OR (CUSTOM PROCEDURE TRAY) ×3 IMPLANT
KIT ROOM TURNOVER OR (KITS) ×3 IMPLANT
MANIFOLD NEPTUNE II (INSTRUMENTS) ×3 IMPLANT
NS IRRIG 1000ML POUR BTL (IV SOLUTION) ×3 IMPLANT
PACK ORTHO EXTREMITY (CUSTOM PROCEDURE TRAY) ×3 IMPLANT
PAD ARMBOARD 7.5X6 YLW CONV (MISCELLANEOUS) ×6 IMPLANT
SET HNDPC FAN SPRY TIP SCT (DISPOSABLE) IMPLANT
SPONGE LAP 18X18 X RAY DECT (DISPOSABLE) ×3 IMPLANT
STOCKINETTE IMPERVIOUS 9X36 MD (GAUZE/BANDAGES/DRESSINGS) IMPLANT
STOCKINETTE IMPERVIOUS LG (DRAPES) IMPLANT
SUT ETHILON 2 0 PSLX (SUTURE) ×6 IMPLANT
SWAB COLLECTION DEVICE MRSA (MISCELLANEOUS) ×3 IMPLANT
TOWEL OR 17X24 6PK STRL BLUE (TOWEL DISPOSABLE) ×3 IMPLANT
TOWEL OR 17X26 10 PK STRL BLUE (TOWEL DISPOSABLE) ×3 IMPLANT
TUBE ANAEROBIC SPECIMEN COL (MISCELLANEOUS) IMPLANT
TUBE CONNECTING 12'X1/4 (SUCTIONS) ×1
TUBE CONNECTING 12X1/4 (SUCTIONS) ×2 IMPLANT
UNDERPAD 30X30 INCONTINENT (UNDERPADS AND DIAPERS) ×3 IMPLANT
WATER STERILE IRR 1000ML POUR (IV SOLUTION) ×3 IMPLANT
YANKAUER SUCT BULB TIP NO VENT (SUCTIONS) ×3 IMPLANT

## 2015-03-04 NOTE — Transfer of Care (Signed)
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

## 2015-03-04 NOTE — Progress Notes (Signed)
[name], [name] ([id number]) Visit Report for 03/03/2015 Arrival Information Details [name] Date of Service: 03/03/2015 1:30 PM Patient Name: J. Patient Account Number: [id number] Medical Record Treating RN: [id number] Number: Other Clinician: Jacqulyn [name] Date of Birth/Sex: 1965/10/11 (50 y.o. Male) Treating [name] Primary Care Physician/Extender: [name] Physician: Referring Physician: Sharilyn [name] in Treatment: 7 Visit Information History Since Last Visit Added or deleted any medications: No Patient Arrived: Ambulatory Any new allergies or adverse reactions: No Arrival Time: 12:50 Had a fall or experienced change in No Accompanied By: self activities of daily living that may affect Transfer Assistance: None risk of falls: Patient Identification Verified: Yes Signs or symptoms of abuse/neglect No Secondary Verification Process Yes since last visito Completed: Hospitalized since last visit: No Patient Requires Transmission-Based No Has Dressing in Place as Prescribed: Yes Precautions: Has Footwear/Offloading in Place as Yes Patient Has Alerts: Yes Prescribed: Right: Surgical Shoe with Pressure Relief Insole Pain Present Now: No Electronic Signature(s) Signed: 03/03/2015 4:08:36 PM By: [name] RCP, RRT, CHT Entered By: Becky Sax, Amado Nash on 03/03/2015 13:08:53 [name] ([id number]) -------------------------------------------------------------------------------- Encounter Discharge Information Details [name] Date of Service: 03/03/2015 1:30 PM Patient Name: J. Patient Account Number: [id number] Medical Record Treating RN: [id number] Number: Other Clinician: Jacqulyn [name] Date of Birth/Sex: May 29, 1965 (50 y.o. Male) Treating [name] Primary Care Physician/Extender: [name] Physician: Referring Physician: Sharilyn [name] in Treatment:  7 Encounter Discharge Information Items Discharge Pain Level: 0 Discharge Condition: Stable Ambulatory Status: Ambulatory Discharge Destination: Home Private Transportation: Auto Accompanied By: self Schedule Follow-up Appointment: No Medication Reconciliation completed and No provided to Patient/Care [name]: Clinical Summary of Care: Notes Patient has an HBO treatment scheduled on 03/04/15 at 13:00 pm. Electronic Signature(s) Signed: 03/03/2015 4:08:36 PM By: [name] RCP, RRT, CHT Entered By: Becky Sax, Amado Nash on 03/03/2015 16:07:52 [name] ([id number]) -------------------------------------------------------------------------------- Vitals Details [name] Date of Service: 03/03/2015 1:30 PM Patient Name: J. Patient Account Number: [id number] Medical Record Treating RN: [id number] Number: Other Clinician: Jacqulyn [name] Date of Birth/Sex: 08/04/1965 (50 y.o. Male) Treating [name] Primary Care Physician/Extender: [name] Physician: Referring Physician: Sharilyn [name] in Treatment: 7 Vital Signs Time Taken: 12:52 Temperature (F): 98.3 Height (in): 74 Pulse (bpm): 96 Weight (lbs): 288 Respiratory Rate (breaths/min): 18 Body Mass Index (BMI): 37 Blood Pressure (mmHg): 132/74 Capillary Blood Glucose (mg/dl): 217 Reference Range: 80 - 120 mg / dl Electronic Signature(s) Signed: 03/03/2015 4:08:36 PM By: [name] RCP, RRT, CHT Entered By: Becky Sax, Amado Nash on 03/03/2015 13:09:17

## 2015-03-04 NOTE — Op Note (Signed)
DATE OF OPERATION: 03-04-15  PREOPERATIVE DIAGNOSIS:  Right 5th metatarsal acute osteomyelitis with cellulitis and abscess   POSTOPERATIVE DIAGNOSIS:  Same  OPERATION PERFORMED:  Right foot incision and drainage with 5th metatarsal partial amputation and removal toe/infected bone and soft tissue with culture  SURGEON: Landis Martins, DPM   ANESTHESIA: General   HEMOSTASIS: Right pneumatic ankle tourniquet set at 250 mmHg   ESTIMATED BLOOD LOSS: Minimal  MATERIALS: 1/2 plain packing  and 4-0 prolene  INJECTABLES: None  COMPLICATIONS: None  PATHOLOGY: Right 5th metatarsal sent for culture and pathology   INDICATIONS FOR PROCEDURE:  The patient is a 50 year old male who presented on outpatient bases to office and was evaluated by my associate Dr. Jacqualyn Posey who immediate noticed warmth, swelling, and bony destruction on xray with chronic ulcerations at right 5th MTPJ. Patient was referred by Dr. Con Memos for evaluation of the 2 ulcerations to 5th MTPJ on right foot because the patient had failed to improve to heal with HBO and local wound care. The patient was admitted to the hospital with this nonhealing ulcer, osteomyelitis, and cellulitis. The patient was given various options for treatment. The patient elected surgical treatment at this time. He was aware of the potential risks, benefits and outcomes this surgery entails. The patient has been n.p.o. since 1230pm. He has signed the consent form for surgery and has been medically cleared for surgery due to acute/emergent need for amputation.    DESCRIPTION OF OPERATION:  The patient was wheeled back into the operating room and placed on the operating table in a supine position. A well-padded right pneumatic ankle tourniquet was then placed. After general induction, The right foot was then scrubbed, prepped and draped in the usual aseptic manner. The foot was then elevated at approximately an angle of 60 degrees for approximately 5 minutes for  exsanguination. The tourniquet was then inflated.  Attention was then directed to the dorsolateral aspect of the patient's right foot where there was an <1cm ulceration present and also a secondary ulceration noted plantar 5th MTPJ. Using a 10 blade a racquet-type incision was made, extending distally, looping around the 5th toe into the web space. The incision was made in the routine fashion, single layer down to the level of bone. When doing so, there was  purulence noted at the incision site. The incision was carried deep down to the level of the 5th metatarsophalangeal joint where the 5th toe was disarticulated at that level. Once disarticulated, at the 5th metatarsophalangeal joint, the 5th toe was removed from the operative field and placed on the back stable to be sent for pathologic/Microbiolgy analysis. The 5th metatarsal head was inspected and there was siginifcant bone destruction and clinical evidence of osteomyelitis to the 5th metatarsal head and shaft; thus using a sagittal saw the metatarsal head and bone was transected midshaft to good healthy bone where there were no defects or remaining signs of clinical osteomyelitis. Any remaining devitalizing necrotic or infected tissue was debrided as necessary. Wound cultures/swabs were also obtained. The operative site was then irrigated utilizing antibiotic irrigation and a pulse lavage, 3 liters of normal sterile saline. Partial skin closure was then performed utilizing 4-0 prolene in simple interrupted suture fashion; the distal portion of the incision was packed open using 1/2 inch plain packing. The tourniquet was deflated and prompt hyperemic response was noted to the right lower extremity. The area was then dressed with xeroform, 4x4, abd, kerlix and ace wrap.  DISPOSITION:  The patient tolerated the  procedure and anesthesia well and was transferred to the recovery room with vital signs stable and vascular status intact to his right lower  extremity. Patient is to be admitted to 5 N for 24-48 hours until final cultures results are obtained to determine if PICC line antibiotics need to changed. Patient to be be monitored and bloodwork repeated during this admission to ensure that WBC and infection is improving. Patient to follow up in office with me on Monday after discharge. Plan to continue PICC line antibiotics, local wound care, and HBO on outpatient bases.  Landis Martins, DPM

## 2015-03-04 NOTE — ED Notes (Signed)
Pt sent from his podiatrist to the ED to be admitted for a scheduled amputation of his 5th digit on the R foot due to a diabetic ulcer.

## 2015-03-04 NOTE — Brief Op Note (Signed)
03/04/2015  8:41 PM  PATIENT:  [name]  50 y.o. male  PRE-OPERATIVE DIAGNOSIS:  Osteomyelitis, Right foot  POST-OPERATIVE DIAGNOSIS:  Osteomyelitis, Right foot  PROCEDURE:  Procedure(s): IRRIGATION AND DEBRIDEMENT RIGHT FOOT (Right) PARTIAL AMPUTATION RIGHT 5TH METATARSAL (Right)  SURGEON:  Surgeon(s) and Role:    Landis Martins, DPM - Primary  PHYSICIAN ASSISTANT:   ASSISTANTS: none   ANESTHESIA:   general  EBL:  Total I/O In: 500 [I.V.:500] Out: -   BLOOD ADMINISTERED:none  DRAINS: 1/2 inch plain packing   LOCAL MEDICATIONS USED:  NONE  SPECIMEN:  Source of Specimen:  Right foot bone and soft tissue  DISPOSITION OF SPECIMEN:  PATHOLOGY  COUNTS:  YES  TOURNIQUET:   Total Tourniquet Time Documented: Leg (Right) - 32 minutes Total: Leg (Right) - 32 minutes   DICTATION: .Note written in EPIC  PLAN OF CARE: Admit to inpatient   PATIENT DISPOSITION:  PACU - hemodynamically stable.   Delay start of Pharmacological VTE agent (>24hrs) due to surgical blood loss or risk of bleeding: not applicable

## 2015-03-04 NOTE — Anesthesia Procedure Notes (Signed)
Procedure Name: Intubation Date/Time: 03/04/2015 7:35 PM Performed by: Trixie Deis A Pre-anesthesia Checklist: Patient identified, Emergency Drugs available, Suction available, Patient being monitored and Timeout performed Patient Re-evaluated:Patient Re-evaluated prior to inductionOxygen Delivery Method: Circle system utilized Preoxygenation: Pre-oxygenation with 100% oxygen Intubation Type: IV induction Ventilation: Mask ventilation without difficulty Laryngoscope Size: Mac and 4 Grade View: Grade I Tube type: Oral Tube size: 7.5 mm Number of attempts: 1 Airway Equipment and Method: Stylet Placement Confirmation: ETT inserted through vocal cords under direct vision,  positive ETCO2 and breath sounds checked- equal and bilateral Secured at: 23 cm Tube secured with: Tape Dental Injury: Teeth and Oropharynx as per pre-operative assessment

## 2015-03-04 NOTE — Progress Notes (Signed)
   Subjective:    Patient ID: [name], male    DOB: [birth year], 50 y.o.   MRN: [id number]  HPI  Patient presents today as a surgical consultation from Dr. Con Memos in regards to 2 wounds on his right foot that have been ongoing for the last several months. He is currently undergoing HBO and daily wound care treatments and is currently on a PICC line and PO abx as well. He states his foot has been more swollen and red recently. Denies any fevers, chills, nausea, vomiting. No other complaints at this time.    Review of Systems  All other systems reviewed and are negative.      Objective:   Physical Exam General: AAO x3, NAD  Dermatological: Right foot submet 5 ulcer measuring 1 x 1 x 2.5 cm and a 2nd wound on the lateral 5th met head area. The plantar wound probes directly from plantar to dorsal and communicates with the lateral wound. There was packing in place. Upon removal, purulent discharge was removed. I am unable to probe any bone. See x-ray below. There is edema to the right foot and leg with an increase in warmth and erythema. There is no fluctuance or crepitance. No other open sore is present today.   Vascular: Dorsalis Pedis artery and Posterior Tibial artery pedal pulses are 2/4 bilateral with immedate capillary fill time. Pedal hair growth present. No varicosities and no lower extremity edema present bilateral. There is no pain with calf compression, swelling, warmth, erythema.   Neruologic: Grossly intact via light touch bilateral. Vibratory intact via tuning fork bilateral. Protective threshold with Semmes Wienstein monofilament intact to all pedal sites bilateral. Patellar and Achilles deep tendon reflexes 2+ bilateral. No Babinski or clonus noted bilateral.   Musculoskeletal: No gross boney pedal deformities bilateral. No pain, crepitus, or limitation noted with foot and ankle range of motion bilateral.   Gait: Unassisted, Nonantalgic.      Assessment & Plan:    50 year old male with right lower extremity cellulitis, osteomyelitis -Treatment options discussed including all alternatives, risks, and complications -X-rays were obtained and reviewed with the patient. There is destructive changes of the 5th metatarsal and toe consistent with osteomyelitis. Upon probing the wound, unable to probe bone but probably because it has been eroded.  -Given his clinical finding and x-ray changes, I recommend at least partial 5th ray amputation. He is on PICC/PO abx, currently HBO and symptoms have worsened. Given the worsening of symptoms, I recommend admission to the hospital. He is to go to [facility] for further workup, surgery and will be admitted. I spoke to the on-call hospitalit who accepted the patient.  -I am unable to preform this surgery due to my schedule and I spoke with Dr. Cannon Kettle and she will take over the patient. Also, discussed with Dr. Con Memos.   Celesta Gentile, DPM

## 2015-03-04 NOTE — ED Provider Notes (Signed)
CSN: [id number]     Arrival date & time 03/04/15  1352 History  By signing my name below, I, [name], attest that this documentation has been prepared under the direction and in the presence of [name] Carnes, PA-C. Electronically Signed: [name], ED Scribe. 03/04/2015. 4:30 PM.   Chief Complaint  Patient presents with  . Wound Infection   The history is provided by the patient. No language interpreter was used.  HPI Comments: [name] is a 50 y.o. Male with a hx of DM and HTN who presents to the Emergency Department after being sent by his podiatrist for a diabetic ulcer to the right fifth toe. Pt's podiatrist sent him here to be admitted to have the toe amputated by podiatrist, Dr. Cannon Kettle.  Wound has been chronic for several months but became infected at the beginning of February, patient was found to have osteomyelitis at this time.  He was admitted at [facility], had PICC line placed and had been receiving daily Rocephin 2g. Patient denies any current fever, chills, sweats.  No cardiac hx.  VSS.  Past Medical History  Diagnosis Date  . Diabetes mellitus without complication ([place])   . Hypertension    Past Surgical History  Procedure Laterality Date  . Appendectomy     No family history on file. Social History  Substance Use Topics  . Smoking status: Never Smoker   . Smokeless tobacco: None  . Alcohol Use: No    Review of Systems  Musculoskeletal: Positive for arthralgias.  Skin: Positive for wound.  All other systems reviewed and are negative.  Allergies  Review of patient's allergies indicates no known allergies.  Home Medications   Prior to Admission medications   Medication Sig Start Date End Date Taking? Authorizing Provider  aspirin EC 81 MG tablet Take 81 mg by mouth daily.    Historical Provider, MD  cefTRIAXone 1 g in dextrose 5 % 50 mL Inject 2 g into the vein daily.    Historical Provider, MD  fluconazole (DIFLUCAN) 200 MG tablet Take 200 mg by  mouth daily. 02/08/15   Historical Provider, MD  glimepiride (AMARYL) 2 MG tablet Take 2 mg by mouth 2 (two) times daily. 10/26/14   Historical Provider, MD  INVOKANA 300 MG TABS tablet Take 300 mg by mouth daily. 11/09/14   Historical Provider, MD  levofloxacin (LEVAQUIN) 750 MG tablet Take 750 mg by mouth daily. 02/08/15   Historical Provider, MD  metFORMIN (GLUCOPHAGE) 1000 MG tablet Take 1,000 mg by mouth 2 (two) times daily. 11/09/14   Historical Provider, MD  methocarbamol (ROBAXIN) 500 MG tablet Take 1 tablet (500 mg total) by mouth 2 (two) times daily. Patient not taking: Reported on 03/04/2015 04/30/14   Hyman Bible, PA-C  Multiple Vitamin (MULTIVITAMIN) tablet Take 1 tablet by mouth daily.    Historical Provider, MD  Olmesartan-Amlodipine-HCTZ 40-10-25 MG TABS Take 1 tablet by mouth daily. Reported on 02/04/2015 12/28/14   Historical Provider, MD  omega-3 acid ethyl esters (LOVAZA) 1 g capsule Take 2 g by mouth daily.    Historical Provider, MD  oxyCODONE-acetaminophen (ROXICET) 5-325 MG tablet Take 1 tablet by mouth every 6 (six) hours as needed for severe pain. 02/05/15   Lytle Butte, MD  traMADol (ULTRAM) 50 MG tablet Take 1 tablet (50 mg total) by mouth every 6 (six) hours as needed for moderate pain. 02/05/15   Lytle Butte, MD  TRESIBA FLEXTOUCH 200 UNIT/ML SOPN Inject 100 Units into the skin  daily. 12/28/14   Historical Provider, MD   BP 108/71 mmHg  Pulse 91  Temp(Src) 98.1 F (36.7 C) (Oral)  Resp 16  Ht 6\' 2"  (1.88 m)  Wt 285 lb (129.275 kg)  BMI 36.58 kg/m2  SpO2 97%   Physical Exam  Constitutional: He is oriented to person, place, and time. He appears well-developed and well-nourished.  HENT:  Head: Normocephalic and atraumatic.  Mouth/Throat: Oropharynx is clear and moist.  Eyes: Conjunctivae and EOM are normal. Pupils are equal, round, and reactive to light.  Neck: Normal range of motion.  Cardiovascular: Normal rate, regular rhythm and normal heart sounds.    Pulmonary/Chest: Effort normal and breath sounds normal.  Abdominal: Soft. Bowel sounds are normal.  Musculoskeletal: Normal range of motion.  Chronic appearing ulcers noted to dorsal and volar surface of right 5th toe; purulent drainage noted from both ulcers; generalized swelling and erythema noted to affected toe; DP pulse intact  PICC line in right AC, clean- no signs of infection currently  Neurological: He is alert and oriented to person, place, and time.  Skin: Skin is warm and dry.  Psychiatric: He has a normal mood and affect.  Nursing note and vitals reviewed.   ED Course  Procedures (including critical care time) DIAGNOSTIC STUDIES: Oxygen Saturation is 97% on RA, normal by my interpretation.    COORDINATION OF CARE: 4:30 PM-Discussed treatment plan which includes admission to the hospital with pt at bedside and pt agreed to plan.    Labs Review Labs Reviewed  COMPREHENSIVE METABOLIC PANEL - Abnormal; Notable for the following:    Chloride 99 (*)    Glucose, Bld 194 (*)    BUN 27 (*)    Albumin 3.3 (*)    All other components within normal limits  CBC - Abnormal; Notable for the following:    WBC 13.2 (*)    All other components within normal limits  PROTIME-INR  APTT  LACTIC ACID, PLASMA    Imaging Review No results found.    EKG Interpretation None      MDM   Final diagnoses:  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[place])   50 year old male here for admission for right fifth toe amputation secondary to osteomyelitis. Initially started as a diabetic foot ulcer but has been progressively worsening despite treatment with Rocephin daily via PICC line.  Patient afebrile, non-toxic.  He does have draining ulcers of right 5th toe along volar and dorsal surfaces, purulent drainage from both wounds noted.  Foot remains warm, well perfused.  PICC line in right [facility] appears clean.  Patient admitted to hospitalist service for further management.  Plan for surgery  this evening with podiatry.  I personally performed the services described in this documentation, which was scribed in my presence. The recorded information has been reviewed and is accurate.  Larene Pickett, PA-C 03/04/15 1911  Lacretia Leigh, MD 03/04/15 [phone]

## 2015-03-04 NOTE — Anesthesia Preprocedure Evaluation (Signed)
Anesthesia Evaluation  Patient identified by MRN, date of birth, ID band Patient awake    Reviewed: Allergy & Precautions, H&P , NPO status , Patient's Chart, lab work & pertinent test results  History of Anesthesia Complications Negative for: history of anesthetic complications  Airway Mallampati: II  TM Distance: >3 FB Neck ROM: full    Dental no notable dental hx.    Pulmonary neg pulmonary ROS,    Pulmonary exam normal breath sounds clear to auscultation       Cardiovascular hypertension, Pt. on medications negative cardio ROS Normal cardiovascular exam Rhythm:regular Rate:Normal     Neuro/Psych negative neurological ROS     GI/Hepatic negative GI ROS, Neg liver ROS,   Endo/Other  diabetes, Poorly Controlled, Type 2obesity  Renal/GU negative Renal ROS     Musculoskeletal   Abdominal   Peds  Hematology negative hematology ROS (+)   Anesthesia Other Findings Right foot osteomyelitis  Reproductive/Obstetrics negative OB ROS                             Anesthesia Physical Anesthesia Plan  ASA: III  Anesthesia Plan: General   Post-op Pain Management:    Induction: Intravenous  Airway Management Planned: LMA  Additional Equipment:   Intra-op Plan:   Post-operative Plan: Extubation in OR  Informed Consent: I have reviewed the patients History and Physical, chart, labs and discussed the procedure including the risks, benefits and alternatives for the proposed anesthesia with the patient or authorized representative who has indicated his/her understanding and acceptance.   Dental Advisory Given  Plan Discussed with: Anesthesiologist, CRNA and Surgeon  Anesthesia Plan Comments:         Anesthesia Quick Evaluation

## 2015-03-04 NOTE — H&P (Signed)
[facility] History and Physical  ANIV GELL [id number] DOB: 08-08-1965 DOA: 03/04/2015  Referring physician: Emergency Department PCP: Tivis Ringer, MD   CHIEF COMPLAINT:   Right foot diabetic ulcer                HPI: [name] is a 50 y.o. male with longstanding type II diabetes on insulin and oral agents. Several months ago patient was walking to improve glucose control. He subsequently developed an ulcer on the bottom of right foot (under 5th digit). He required wound care, hyperbaric 02 and wound significantly improved. Then a couple of weeks ago patient developed a lesion on the same foot located on the lateral aspect of the fifth digit. Lesion was lanced, patient started on Levaquin and  daily Rocephin via PICC. Seemed to be improving until a few days ago. Now with significant right foot pain / RLE swelling. Reports that an ultrasound doppler was done and negative for DVT. Patient had follow up with Podiatrist today who decided that amputation was required at this time. Arrangements were made for [place] to admit the patient, podiatrist will operate this evening                Labs:   BUN 27, creatinine 1.12, WBC 13        EKG:    Normal sinus rhythm Possible Anterior infarct , age undetermined Abnormal ECG               Medications - No data to display  Review of Systems  Constitutional: Negative.   HENT: Negative.   Eyes: Negative.   Respiratory: Negative.   Cardiovascular: Positive for leg swelling.  Gastrointestinal: Negative.   Genitourinary: Negative.   Musculoskeletal: Positive for joint pain.  Skin: Negative.   Neurological: Negative.   Endo/Heme/Allergies: Negative.   Psychiatric/Behavioral: Negative.     Past Medical History  Diagnosis Date  . Diabetes mellitus without complication ([place])   . Hypertension    Past Surgical History  Procedure Laterality Date  . Appendectomy      SOCIAL HISTORY:  reports that he has never  smoked. He does not have any smokeless tobacco history on file. He reports that he does not drink alcohol or use illicit drugs. Lives:   At home   Assistive devices:   None needed for ambulation.   No Known Allergies  FMH: Father - diabetes  Prior to Admission medications   Medication Sig Start Date End Date Taking? Authorizing Provider  aspirin EC 81 MG tablet Take 81 mg by mouth daily.    Historical Provider, MD  cefTRIAXone 1 g in dextrose 5 % 50 mL Inject 2 g into the vein daily.    Historical Provider, MD  fluconazole (DIFLUCAN) 200 MG tablet Take 200 mg by mouth daily. 02/08/15   Historical Provider, MD  glimepiride (AMARYL) 2 MG tablet Take 2 mg by mouth 2 (two) times daily. 10/26/14   Historical Provider, MD  INVOKANA 300 MG TABS tablet Take 300 mg by mouth daily. 11/09/14   Historical Provider, MD  levofloxacin (LEVAQUIN) 750 MG tablet Take 750 mg by mouth daily. 02/08/15   Historical Provider, MD  metFORMIN (GLUCOPHAGE) 1000 MG tablet Take 1,000 mg by mouth 2 (two) times daily. 11/09/14   Historical Provider, MD  methocarbamol (ROBAXIN) 500 MG tablet Take 1 tablet (500 mg total) by mouth 2 (two) times daily. Patient not taking: Reported on 03/04/2015 04/30/14   Hyman Bible, PA-C  Multiple Vitamin (MULTIVITAMIN)  tablet Take 1 tablet by mouth daily.    Historical Provider, MD  Olmesartan-Amlodipine-HCTZ 40-10-25 MG TABS Take 1 tablet by mouth daily. Reported on 02/04/2015 12/28/14   Historical Provider, MD  omega-3 acid ethyl esters (LOVAZA) 1 g capsule Take 2 g by mouth daily.    Historical Provider, MD  oxyCODONE-acetaminophen (ROXICET) 5-325 MG tablet Take 1 tablet by mouth every 6 (six) hours as needed for severe pain. 02/05/15   Lytle Butte, MD  traMADol (ULTRAM) 50 MG tablet Take 1 tablet (50 mg total) by mouth every 6 (six) hours as needed for moderate pain. 02/05/15   Lytle Butte, MD  TRESIBA FLEXTOUCH 200 UNIT/ML SOPN Inject 100 Units into the skin daily. 12/28/14   Historical  Provider, MD   PHYSICAL EXAM: Filed Vitals:   03/04/15 1359  BP: 108/71  Pulse: 91  Temp: 98.1 F (36.7 C)  TempSrc: Oral  Resp: 16  Height: 6\' 2"  (1.88 m)  Weight: 129.275 kg (285 lb)  SpO2: 97%    Wt Readings from Last 3 Encounters:  03/04/15 129.275 kg (285 lb)  02/04/15 130.3 kg (287 lb 4.2 oz)  01/27/15 129.275 kg (285 lb)    General:  Pleasant obese white male. Appears calm and comfortable Eyes: PER, normal lids, irises & conjunctiva ENT: grossly normal hearing, lips & tongue Neck: no LAD, no masses Cardiovascular: RRR, no murmurs. No LE edema.  Respiratory: Respirations even and unlabored. Normal respiratory effort. Lungs CTA bilaterally, no wheezes / rales .   Abdomen: soft, non-distended, non-tender, active bowel sounds. No obvious masses.  Skin: no rash seen on limited exam Extremities: RLE edematous. Right foot with erythema and edema of 5th digit. Lateral aspect of 5th digit markedly swollen with ulceration.  Dime size non-draining wound bottom of right foot under 5th digit. Musculoskeletal: grossly normal tone BUE/BLE Psychiatric: grossly normal mood and affect, speech fluent and appropriate Neurologic: grossly non-focal.         LABS ON ADMISSION:    Basic Metabolic Panel:  Recent Labs Lab 03/04/15 1400  NA 137  K 4.8  CL 99*  CO2 23  GLUCOSE 194*  BUN 27*  CREATININE 1.12  CALCIUM 9.3   Liver Function Tests:  Recent Labs Lab 03/04/15 1400  AST 24  ALT 18  ALKPHOS 63  BILITOT 0.6  PROT 7.4  ALBUMIN 3.3*    CBC:  Recent Labs Lab 03/04/15 1400  WBC 13.2*  HGB 15.0  HCT 43.8  MCV 86.2  PLT 267   CREATININE: 1.12 (03/04/15 1400) Estimated creatinine clearance - 114 mL/min   ASSESSMENT / PLAN   Type II diabetes mellitus ([place]).  -hold home diabetic agents today.  -hold home basal insulin until eating. Possibly restart in am -SSI   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[place]). -for amputation this evening by Podiatrist. We  will admit, Podiatrist doesn't admit here -IV antibiotics per Surgical team.   Hyperlipidemia.  -Can restart home [place] tomorrow.   CONSULTANTS:    None  Code Status: Full code DVT Prophylaxis: Per Surgical team Family Communication:  Patient alert, oriented and understands plan of care.  Disposition Plan: Discharge to home in 24-48 hours   Time spent: 60 minutes Tye Savoy  NP [facility] Pager [phone]

## 2015-03-05 ENCOUNTER — Encounter (HOSPITAL_COMMUNITY): Payer: Self-pay | Admitting: Sports Medicine

## 2015-03-05 ENCOUNTER — Ambulatory Visit: Payer: Managed Care, Other (non HMO) | Admitting: Surgery

## 2015-03-05 ENCOUNTER — Encounter: Payer: Managed Care, Other (non HMO) | Admitting: Surgery

## 2015-03-05 DIAGNOSIS — M86271 Subacute osteomyelitis, right ankle and foot: Secondary | ICD-10-CM

## 2015-03-05 DIAGNOSIS — E1159 Type 2 diabetes mellitus with other circulatory complications: Secondary | ICD-10-CM

## 2015-03-05 LAB — GLUCOSE, CAPILLARY
Glucose-Capillary: 83 mg/dL (ref 65–99)
Glucose-Capillary: 227 mg/dL — ABNORMAL HIGH (ref 65–99)
Glucose-Capillary: 109 mg/dL — ABNORMAL HIGH (ref 65–99)
Glucose-Capillary: 196 mg/dL — ABNORMAL HIGH (ref 65–99)

## 2015-03-05 MED ORDER — METFORMIN HCL 500 MG PO TABS
1000.0000 mg | ORAL_TABLET | Freq: Two times a day (BID) | ORAL | Status: DC
  Administered 2015-03-05 – 2015-03-11 (×12): 1000 mg via ORAL
  Filled 2015-03-05 (×12): qty 2

## 2015-03-05 MED ORDER — CANAGLIFLOZIN 300 MG PO TABS
300.0000 mg | ORAL_TABLET | Freq: Every day | ORAL | Status: DC
  Administered 2015-03-05 – 2015-03-11 (×7): 300 mg via ORAL
  Filled 2015-03-05 (×12): qty 1

## 2015-03-05 MED ORDER — DEXTROSE 5 % IV SOLN
2.0000 g | Freq: Three times a day (TID) | INTRAVENOUS | Status: DC
  Administered 2015-03-05 – 2015-03-11 (×19): 2 g via INTRAVENOUS
  Filled 2015-03-05 (×23): qty 2

## 2015-03-05 MED ORDER — GLIMEPIRIDE 4 MG PO TABS
2.0000 mg | ORAL_TABLET | Freq: Two times a day (BID) | ORAL | Status: DC
  Administered 2015-03-05 – 2015-03-09 (×8): 2 mg via ORAL
  Filled 2015-03-05 (×9): qty 1

## 2015-03-05 MED ORDER — MENTHOL 3 MG MT LOZG
1.0000 | LOZENGE | OROMUCOSAL | Status: DC | PRN
  Administered 2015-03-05: 3 mg via ORAL
  Filled 2015-03-05 (×2): qty 9

## 2015-03-05 NOTE — Progress Notes (Signed)
PROGRESS NOTE  [name] [id number] DOB: 1965-12-30 DOA: 03/04/2015 PCP: Tivis Ringer, MD  Assessment/Plan:  Type II diabetes mellitus ([place]).  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 -s/p amputation by Podiatrist- follow up on Monday in office per Dr. Cannon Kettle -IV antibiotics -await cultures  Hyperlipidemia.  -Can restart home [place] tomorrow.   Code Status: full Family Communication: patient Disposition Plan:    Consultants:  Dr. Cannon Kettle (podiatry)  Procedures:     HPI/Subjective: Pain controlled  Objective: Filed Vitals:   03/05/15 0531 03/05/15 0906  BP: 119/62 120/69  Pulse: 80 83  Temp: 98.6 F (37 C)   Resp: 16     Intake/Output Summary (Last 24 hours) at 03/05/15 1310 Last data filed at 03/05/15 0506  Gross per 24 hour  Intake    550 ml  Output    450 ml  Net    100 ml   Filed Weights   03/04/15 1359  Weight: 129.275 kg (285 lb)    Exam:   General:  Awake, NAD  Cardiovascular: rrr  Respiratory: clear  Abdomen: +BS, soft  Musculoskeletal: right foot wrapped in ACE bandage  Data Reviewed: Basic Metabolic Panel:  Recent Labs Lab 03/04/15 1400  NA 137  K 4.8  CL 99*  CO2 23  GLUCOSE 194*  BUN 27*  CREATININE 1.12  CALCIUM 9.3   Liver Function Tests:  Recent Labs Lab 03/04/15 1400  AST 24  ALT 18  ALKPHOS 63  BILITOT 0.6  PROT 7.4  ALBUMIN 3.3*   No results for input(s): LIPASE, AMYLASE in the last 168 hours. No results for input(s): AMMONIA in the last 168 hours. CBC:  Recent Labs Lab 03/04/15 1400  WBC 13.2*  HGB 15.0  HCT 43.8  MCV 86.2  PLT 267   Cardiac Enzymes: No results for input(s): CKTOTAL, CKMB, CKMBINDEX, TROPONINI in the last 168 hours. BNP (last 3 results) No results for input(s): BNP in the last 8760 hours.  ProBNP (last 3 results) No results for input(s): PROBNP in the last 8760 hours.  CBG:  Recent Labs Lab 03/04/15 1847  03/04/15 2037 03/04/15 2231 03/05/15 0635 03/05/15 1147  GLUCAP 97 73 183* 83 227*    Recent Results (from the past 240 hour(s))  Anaerobic culture     Status: None (Preliminary result)   Collection Time: 03/04/15  7:58 PM  Result Value Ref Range Status   Specimen Description TISSUE RIGHT FOOT  Final   Special Requests 5TH METATARSAL PT ON VANC  Final   Gram Stain   Final    NO WBC SEEN MODERATE GRAM NEGATIVE RODS Performed at [facility]    Culture PENDING  Incomplete   Report Status PENDING  Incomplete  Tissue culture     Status: None (Preliminary result)   Collection Time: 03/04/15  7:58 PM  Result Value Ref Range Status   Specimen Description TISSUE RIGHT FOOT  Final   Special Requests 5TH METATARSAL PT ON VANC  Final   Gram Stain   Final    NO WBC SEEN MODERATE GRAM NEGATIVE RODS Performed at [facility]    Culture PENDING  Incomplete   Report Status PENDING  Incomplete  Anaerobic culture     Status: None (Preliminary result)   Collection Time: 03/04/15  8:05 PM  Result Value Ref Range Status   Specimen Description TISSUE BONE RIGHT FOOT  Final   Special Requests 5TH METATARSAL PT ON [facility]  Final  Gram Stain   Final    RARE WBC PRESENT, PREDOMINANTLY MONONUCLEAR NO ORGANISMS SEEN Performed at [facility]    Culture PENDING  Incomplete   Report Status PENDING  Incomplete  Tissue culture     Status: None (Preliminary result)   Collection Time: 03-31-2015  8:05 PM  Result Value Ref Range Status   Specimen Description TISSUE BONE RIGHT FOOT  Final   Special Requests 5TH METETARSAL PT ON VANC  Final   Gram Stain   Final    RARE WBC PRESENT, PREDOMINANTLY MONONUCLEAR NO ORGANISMS SEEN Performed at [facility]    Culture PENDING  Incomplete   Report Status PENDING  Incomplete  Anaerobic culture     Status: None (Preliminary result)   Collection Time: Mar 31, 2015  8:10 PM  Result Value Ref Range Status   Specimen Description  WOUND RIGHT FOOT  Final   Special Requests SWAB OF 5TH METATARSAL PT ON VANC  Final   Gram Stain   Final    ABUNDANT WBC PRESENT,BOTH PMN AND MONONUCLEAR NO SQUAMOUS EPITHELIAL CELLS SEEN NO ORGANISMS SEEN Performed at [facility]    Culture PENDING  Incomplete   Report Status PENDING  Incomplete  Wound culture     Status: None (Preliminary result)   Collection Time: 03/31/15  8:10 PM  Result Value Ref Range Status   Specimen Description WOUND RIGHT FOOT  Final   Special Requests SWAB OF 5TH METATARSAL PT ON VANC  Final   Gram Stain   Final    ABUNDANT WBC PRESENT,BOTH PMN AND MONONUCLEAR NO SQUAMOUS EPITHELIAL CELLS SEEN NO ORGANISMS SEEN Performed at [facility]    Culture PENDING  Incomplete   Report Status PENDING  Incomplete     Studies: Dg Foot Complete Right  03/31/2015  CLINICAL DATA:  Status post fifth metatarsal amputation EXAM: RIGHT FOOT COMPLETE - 3+ VIEW COMPARISON:  Mar 31, 2015 FINDINGS: There is been interval resection of the majority of the fifth metatarsal as well as the fifth toe. Some air is noted in the surgical bed. No other focal area of bony destruction is noted to suggest osteomyelitis. IMPRESSION: Status post fifth metatarsal and fifth toe amputation. Electronically Signed   By: Inez Catalina M.D.   On: 2015/03/31 21:33   Dg Foot Complete Right  03/31/15  3 views of a skeletally mature individual were obtained of the right foot. Study includes AP, oblique, lateral projections. There is destructive changes to the head of the 5th metatarsal and to the 5th toe consistent with osteomyelitis. No soft tissue emphysema. There is swelling around the 5th MTPJ. No evidence of acute fracture. Vessel calcification is present.    Scheduled Meds: . irbesartan  300 mg Oral Daily   And  . amLODipine  10 mg Oral Daily   And  . hydrochlorothiazide  25 mg Oral Daily  . aspirin EC  81 mg Oral Daily  . cefTRIAXone (ROCEPHIN)  IV  2 g Intravenous Q24H  .  insulin aspart  0-15 Units Subcutaneous TID WC  . insulin aspart  0-5 Units Subcutaneous QHS  . levofloxacin  750 mg Oral Q24H  . 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 If 7PM-7AM, please contact night-coverage at [email or web address], password [facility] 03/05/2015, 1:10 PM  LOS: 1 day

## 2015-03-05 NOTE — Progress Notes (Signed)
KADIN, CAISSE ([id number]) Visit Report for 03/03/2015 HBO Details [name] Date of Service: 03/03/2015 1:30 PM Patient Name: J. Patient Account Number: [id number] Medical Record Treating RN: [id number] Number: Other Clinician: [name] Date of Birth/Sex: May 22, 1965 (50 y.o. Male) Treating [name], [name] Primary Care Physician/Extender: Floyde Parkins Physician: Referring Physician: Sharilyn Sites in Treatment: 7 HBO Treatment Course Details Treatment Course Ordering Physician: Christin Fudge 1 Number: HBO Treatment Start Date: 02/01/2015 Total Treatments 30 Ordered: HBO Indication: Diabetic Ulcer(s) of the Lower Extremity HBO Treatment Details Treatment Number: 22 Patient Type: Outpatient Chamber Type: Monoplace Chamber Serial #: [id number] Treatment Protocol: 2.0 ATA with 90 minutes oxygen, and no air breaks Treatment Details Compression Rate Down: 1.5 psi / minute De-Compression Rate Up: 1.5 psi / minute Air breaks and breathing Compress Tx Pressure periods Decompress Decompress Begins Reached (leave unused spaces Begins Ends blank) Chamber Pressure (ATA) 1 2 - - - - - - 2 1 Clock Time (24 hr) 13:12 13:24 - - - - - - 14:54 15:04 Treatment Length: 112 (minutes) Treatment Segments: 4 Capillary Blood Glucose Pre Capillary Blood Glucose (mg/dl): Post Capillary Blood Glucose (mg/dl): Vital Signs Capillary Blood Glucose Reference Range: 80 - 120 mg / dl HBO Diabetic Blood Glucose Intervention Range: <131 mg/dl or >249 mg/dl Time Vitals Blood Respiratory Capillary Blood Glucose Pulse Action Type: Pulse: Temperature: Taken: Pressure: Rate: Glucose (mg/dl): Meter #: Oximetry (%) Taken: Pre 12:52 132/74 96 18 98.3 217 Post 14:58 114/64 66 18 98.3 158 1 none Beichner, [name] ([id number]) Treatment Response Treatment Completion Status: Treatment Completed without Adverse Event Electronic Signature(s) Signed: 03/03/2015 4:08:36 PM By:  Lorine Bears RCP, RRT, CHT Signed: 03/04/2015 8:24:42 AM By: Linton Ham MD Entered By: Lorine Bears on 03/03/2015 16:06:54 [name] -------------------------------------------------------------------------------- HBO Safety Checklist Details [name] Date of Service: 03/03/2015 1:30 PM Patient Name: J. Patient Account Number: [id number] Medical Record Treating RN: [id number] Number: Other Clinician: [name] Date of Birth/Sex: December 27, 1965 (50 y.o. Male) Treating [name], [name] Primary Care Physician/Extender: Floyde Parkins Physician: Referring Physician: Sharilyn Sites in Treatment: 7 HBO Safety Checklist Items Safety Checklist Consent Form Signed Patient voided / foley secured and emptied When did you last eato 11:30 am Last dose of injectable or oral agent 11:30 am NA Ostomy pouch emptied and vented if applicable NA All implantable devices assessed, documented and approved Intravenous access site secured and place Valuables secured Linens and cotton and cotton/polyester blend (less than 51% polyester) Personal oil-based products / skin lotions / body lotions removed Wigs or hairpieces removed Smoking or tobacco materials removed Books / newspapers / magazines / loose paper removed Cologne, aftershave, perfume and deodorant removed Jewelry removed (may wrap wedding band) Make-up removed Hair care products removed Battery operated devices (external) removed Heating patches and chemical warmers removed NA Titanium eyewear removed NA Nail polish cured greater than 10 hours NA Casting material cured greater than 10 hours NA Hearing aids removed NA Loose dentures or partials removed NA Prosthetics have been removed Patient demonstrates correct use of air break device (if applicable) Patient concerns have been addressed Patient grounding bracelet on and cord attached to chamber Specifics  for Inpatients (complete in addition to above) Medication sheet sent with patient NIXEN, AVALO ([id number]) Intravenous medications needed or due during therapy sent with patient Drainage tubes (e.g. nasogastric tube or chest tube secured and vented) Endotracheal or Tracheotomy tube secured Cuff deflated of air and inflated with saline Airway suctioned Electronic  Signature(s) Signed: 03/03/2015 4:08:36 PM By: Lorine Bears RCP, RRT, CHT Entered By: Lorine Bears on 03/03/2015 13:16:03

## 2015-03-05 NOTE — Care Management (Signed)
Utilization review completed. Santo Zahradnik, RN Case Manager [phone]. 

## 2015-03-05 NOTE — Progress Notes (Signed)
Orthopedic Tech Progress Note Patient Details:  [name] 10-Jan-1965 [id number]  Ortho Devices Type of Ortho Device: Postop shoe/boot Ortho Device/Splint Interventions: Application   [name] 03/05/2015, 9:39 AM

## 2015-03-05 NOTE — Progress Notes (Signed)
Pharmacy Antibiotic Note  [name] is a 50 y.o. male admitted on 03/04/2015 with diabetic foot ucler with osteomyeletis.  Pharmacy has been consulted for cefepime dosing.  Diabetic foot ulcer w/ osteomyelitis, s/p amputation.  Culture from 4 wks ago showed MSSA/Serratia. Wt129 kg; WBC 13.2, creat 1.12. AF.  Plan: Cefepime 2 gm IV q8h  Height: 6\' 2"  (188 cm) Weight: 285 lb (129.275 kg) IBW/kg (Calculated) : 82.2  Temp (24hrs), Avg:98.1 F (36.7 C), Min:97.6 F (36.4 C), Max:98.6 F (37 C)   Recent Labs Lab 03/04/15 1400 03/04/15 2120  WBC 13.2*  --   CREATININE 1.12  --   LATICACIDVEN  --  1.3    Estimated Creatinine Clearance: 114 mL/min (by C-G formula based on Cr of 1.12).    No Known Allergies  Antimicrobials this admission: 3/2 CXT >> 3/3 3/2 lv q  >> 3/3 Cefepime 3/3>>  Dose adjustments this admission:   Microbiology results: 3/2 R foot wound>> 3/2 R foot tissue>>mod GNR 2/20 toe NGF 2/3 R foot>> staph aureus/serratia marcescens   Thank you for allowing pharmacy to be a part of this patient's care.  Eudelia Bunch, Pharm.D. [phone] 03/05/2015 3:01 PM

## 2015-03-06 DIAGNOSIS — E785 Hyperlipidemia, unspecified: Secondary | ICD-10-CM

## 2015-03-06 LAB — BASIC METABOLIC PANEL
Anion gap: 10 (ref 5–15)
BUN: 26 mg/dL — ABNORMAL HIGH (ref 6–20)
CO2: 25 mmol/L (ref 22–32)
Calcium: 8.9 mg/dL (ref 8.9–10.3)
Chloride: 103 mmol/L (ref 101–111)
Creatinine, Ser: 1.01 mg/dL (ref 0.61–1.24)
GFR calc Af Amer: >60 mL/min (ref >60)
GFR calc non Af Amer: >60 mL/min (ref >60)
Glucose, Bld: 140 mg/dL — ABNORMAL HIGH (ref 65–99)
Potassium: 4.5 mmol/L (ref 3.5–5.1)
Sodium: 138 mmol/L (ref 135–145)

## 2015-03-06 LAB — HEMOGLOBIN A1C
Hgb A1c MFr Bld: 7.6 % — ABNORMAL HIGH (ref 4.8–5.6)
Mean Plasma Glucose: 171 mg/dL

## 2015-03-06 LAB — GLUCOSE, CAPILLARY
Glucose-Capillary: 132 mg/dL — ABNORMAL HIGH (ref 65–99)
Glucose-Capillary: 167 mg/dL — ABNORMAL HIGH (ref 65–99)
Glucose-Capillary: 112 mg/dL — ABNORMAL HIGH (ref 65–99)
Glucose-Capillary: 113 mg/dL — ABNORMAL HIGH (ref 65–99)

## 2015-03-06 LAB — CBC
HCT: 39.9 % (ref 39.0–52.0)
Hemoglobin: 13.4 g/dL (ref 13.0–17.0)
MCH: 29.4 pg (ref 26.0–34.0)
MCHC: 33.6 g/dL (ref 30.0–36.0)
MCV: 87.5 fL (ref 78.0–100.0)
Platelets: 227 10*3/uL (ref 150–400)
RBC: 4.56 MIL/uL (ref 4.22–5.81)
RDW: 13.7 % (ref 11.5–15.5)
WBC: 10.5 10*3/uL (ref 4.0–10.5)

## 2015-03-06 MED ORDER — SODIUM CHLORIDE 0.9% FLUSH
10.0000 mL | INTRAVENOUS | Status: DC | PRN
  Administered 2015-03-08: 20 mL
  Administered 2015-03-09: 10 mL
  Filled 2015-03-06 (×2): qty 40

## 2015-03-06 MED ORDER — HEPARIN SODIUM (PORCINE) 5000 UNIT/ML IJ SOLN
5000.0000 [IU] | Freq: Three times a day (TID) | INTRAMUSCULAR | Status: DC
  Administered 2015-03-06 – 2015-03-11 (×15): 5000 [IU] via SUBCUTANEOUS
  Filled 2015-03-06 (×14): qty 1

## 2015-03-06 MED ORDER — OMEGA-3-ACID ETHYL ESTERS 1 G PO CAPS
2.0000 g | ORAL_CAPSULE | Freq: Every day | ORAL | Status: DC
  Administered 2015-03-07 – 2015-03-11 (×5): 2 g via ORAL
  Filled 2015-03-06 (×4): qty 2

## 2015-03-06 NOTE — Consult Note (Signed)
Subjective: [name] is a 50 y.o. diabetic male patient seen at bedside, resting comfortably in no acute distress s/p right foot incision and drainage with partial amputation of 5th metatarsal and culture, performed on 03-04-15.  Patient denies pain at surgical site, denies calf pain, denies headache, chest pain, shortness of breath, nausea, vomitting, denies loss of appetite, denies problems with voiding. No other issues noted.   Patient Active Problem List   Diagnosis Date Noted  . Hyperlipidemia 03/04/2015  . Osteomyelitis ([place]) 03/04/2015  . 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 Foot osteomyelitis, right ([place]) 02/04/2015  . Type II diabetes mellitus ([place]) 07/10/2007  . HYPERTENSION 07/10/2007    No current facility-administered medications on file prior to encounter.   Current Outpatient Prescriptions on File Prior to Encounter  Medication Sig Dispense Refill  . aspirin EC 81 MG tablet Take 81 mg by mouth daily.    . cefTRIAXone 1 g in dextrose 5 % 50 mL Inject 2 g into the vein daily.    . fluconazole (DIFLUCAN) 200 MG tablet Take 200 mg by mouth daily.    Marland Kitchen glimepiride (AMARYL) 2 MG tablet Take 2 mg by mouth 2 (two) times daily.    . INVOKANA 300 MG TABS tablet Take 300 mg by mouth daily.    Marland Kitchen levofloxacin (LEVAQUIN) 750 MG tablet Take 750 mg by mouth daily.    . metFORMIN (GLUCOPHAGE) 1000 MG tablet Take 1,000 mg by mouth 2 (two) times daily.    . methocarbamol (ROBAXIN) 500 MG tablet Take 1 tablet (500 mg total) by mouth 2 (two) times daily. (Patient not taking: Reported on 03/04/2015) 20 tablet 0  . Multiple Vitamin (MULTIVITAMIN) tablet Take 1 tablet by mouth daily.    . Olmesartan-Amlodipine-HCTZ 40-10-25 MG TABS Take 1 tablet by mouth daily. Reported on 02/04/2015    . omega-3 acid ethyl esters (LOVAZA) 1 g capsule Take 2 g by mouth daily.    Marland Kitchen oxyCODONE-acetaminophen (ROXICET) 5-325 MG tablet Take 1 tablet by mouth every 6 (six) hours as needed for  severe pain. 30 tablet 0  . traMADol (ULTRAM) 50 MG tablet Take 1 tablet (50 mg total) by mouth every 6 (six) hours as needed for moderate pain. 30 tablet 0  . TRESIBA FLEXTOUCH 200 UNIT/ML SOPN Inject 100 Units into the skin daily.      No Known Allergies   Objective: Today's Vitals   03/05/15 2130 03/06/15 0421 03/06/15 0521 03/06/15 0613  BP: 128/64   137/66  Pulse: 84   81  Temp: 98.8 F (37.1 C)   98.5 F (36.9 C)  TempSrc: Oral   Oral  Resp: 18   18  Height:      Weight:      SpO2: 95%   97%  PainSc:  3  Asleep    Physical Exam General: No acute distress, A+Ox 3  Focused Right Lower extremity: Dressing to Right foot clean, dry, intact. No strikethrough noted, Upon removal of dressing, At the dorsal lateral incision the sutures are intact to proximal incision line with packing present at the open distal portion of the incision, no surrounding erythema, no warmth or acute signs of infection, localized dema improving, Capillary fill time present to all remaining digits, gross sensation present via light touch. No calf pain. Range of motion excluding surgical site within normal limits with no pain or crepitation.    Post op Xray, Right foot  CLINICAL DATA: Status post fifth metatarsal amputation  EXAM: RIGHT FOOT COMPLETE - 3+ VIEW  COMPARISON: 03/04/2015  FINDINGS: There is been interval resection of the majority of the fifth metatarsal as well as the fifth toe. Some air is noted in the surgical bed. No other focal area of bony destruction is noted to suggest osteomyelitis.  IMPRESSION: Status post fifth metatarsal and fifth toe amputation.   Results for orders placed or performed during the hospital encounter of 03/04/15 (from the past 24 hour(s))  Glucose, capillary     Status: Abnormal   Collection Time: 03/05/15 11:47 AM  Result Value Ref Range   Glucose-Capillary 227 (H) 65 - 99 mg/dL   Comment 1 Notify RN    Comment 2 Document in Chart   Glucose,  capillary     Status: Abnormal   Collection Time: 03/05/15  4:53 PM  Result Value Ref Range   Glucose-Capillary 109 (H) 65 - 99 mg/dL   Comment 1 Notify RN    Comment 2 Document in Chart   Glucose, capillary     Status: Abnormal   Collection Time: 03/05/15  9:32 PM  Result Value Ref Range   Glucose-Capillary 196 (H) 65 - 99 mg/dL  CBC     Status: None   Collection Time: 03/06/15  4:52 AM  Result Value Ref Range   WBC 10.5 4.0 - 10.5 K/uL   RBC 4.56 4.22 - 5.81 MIL/uL   Hemoglobin 13.4 13.0 - 17.0 g/dL   HCT 39.9 39.0 - 52.0 %   MCV 87.5 78.0 - 100.0 fL   MCH 29.4 26.0 - 34.0 pg   MCHC 33.6 30.0 - 36.0 g/dL   RDW 13.7 11.5 - 15.5 %   Platelets 227 150 - 400 K/uL  Basic metabolic panel     Status: Abnormal   Collection Time: 03/06/15  4:52 AM  Result Value Ref Range   Sodium 138 135 - 145 mmol/L   Potassium 4.5 3.5 - 5.1 mmol/L   Chloride 103 101 - 111 mmol/L   CO2 25 22 - 32 mmol/L   Glucose, Bld 140 (H) 65 - 99 mg/dL   BUN 26 (H) 6 - 20 mg/dL   Creatinine, Ser 1.01 0.61 - 1.24 mg/dL   Calcium 8.9 8.9 - 10.3 mg/dL   GFR calc non Af Amer >60 >60 mL/min   GFR calc Af Amer >60 >60 mL/min   Anion gap 10 5 - 15  Glucose, capillary     Status: Abnormal   Collection Time: 03/06/15  6:16 AM  Result Value Ref Range   Glucose-Capillary 132 (H) 65 - 99 mg/dL   Results for orders placed or performed during the hospital encounter of 03/04/15  Anaerobic culture     Status: None (Preliminary result)   Collection Time: 03/04/15  7:58 PM  Result Value Ref Range Status   Specimen Description TISSUE RIGHT FOOT  Final   Special Requests 5TH METATARSAL PT ON VANC  Final   Gram Stain   Final    NO WBC SEEN MODERATE GRAM NEGATIVE RODS Performed at [facility]    Culture   Final    NO ANAEROBES ISOLATED; CULTURE IN PROGRESS FOR 5 DAYS Performed at [facility]    Report Status PENDING  Incomplete  Tissue culture     Status: None (Preliminary result)   Collection  Time: 03/04/15  7:58 PM  Result Value Ref Range Status   Specimen Description TISSUE RIGHT FOOT  Final   Special Requests 5TH METATARSAL PT ON  VANC  Final   Gram Stain   Final    NO WBC SEEN MODERATE GRAM NEGATIVE RODS Performed at [facility]    Culture PENDING  Incomplete   Report Status PENDING  Incomplete  Anaerobic culture     Status: None (Preliminary result)   Collection Time: 03/04/15  8:05 PM  Result Value Ref Range Status   Specimen Description TISSUE BONE RIGHT FOOT  Final   Special Requests 5TH METATARSAL PT ON VANC  Final   Gram Stain   Final    RARE WBC PRESENT, PREDOMINANTLY MONONUCLEAR NO ORGANISMS SEEN Performed at [facility]    Culture   Final    NO ANAEROBES ISOLATED; CULTURE IN PROGRESS FOR 5 DAYS Performed at [facility]    Report Status PENDING  Incomplete  Tissue culture     Status: None (Preliminary result)   Collection Time: 03/04/15  8:05 PM  Result Value Ref Range Status   Specimen Description TISSUE BONE RIGHT FOOT  Final   Special Requests 5TH METETARSAL PT ON VANC  Final   Gram Stain   Final    RARE WBC PRESENT, PREDOMINANTLY MONONUCLEAR NO ORGANISMS SEEN Performed at [facility]    Culture PENDING  Incomplete   Report Status PENDING  Incomplete  Anaerobic culture     Status: None (Preliminary result)   Collection Time: 03/04/15  8:10 PM  Result Value Ref Range Status   Specimen Description WOUND RIGHT FOOT  Final   Special Requests SWAB OF 5TH METATARSAL PT ON VANC  Final   Gram Stain   Final    ABUNDANT WBC PRESENT,BOTH PMN AND MONONUCLEAR NO SQUAMOUS EPITHELIAL CELLS SEEN NO ORGANISMS SEEN Performed at [facility]    Culture PENDING  Incomplete   Report Status PENDING  Incomplete  Wound culture     Status: None (Preliminary result)   Collection Time: 03/04/15  8:10 PM  Result Value Ref Range Status   Specimen Description WOUND RIGHT FOOT  Final   Special Requests SWAB OF 5TH  METATARSAL PT ON VANC  Final   Gram Stain   Final    ABUNDANT WBC PRESENT,BOTH PMN AND MONONUCLEAR NO SQUAMOUS EPITHELIAL CELLS SEEN NO ORGANISMS SEEN Performed at [facility]    Culture PENDING  Incomplete   Report Status PENDING  Incomplete     Assessment and Plan:  Problem List Items Addressed This Visit      Endocrine   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[place]) - Primary   Relevant Medications   aspirin EC tablet 81 mg   insulin aspart (novoLOG) injection 0-15 Units   insulin aspart (novoLOG) injection 0-5 Units   irbesartan (AVAPRO) tablet 300 mg   glimepiride (AMARYL) tablet 2 mg   canagliflozin (INVOKANA) tablet 300 mg   metFORMIN (GLUCOPHAGE) tablet 1,000 mg   ceFEPIme (MAXIPIME) 2 g in dextrose 5 % 50 mL IVPB    Other Visit Diagnoses    S/P amputation of lesser toe, right (HCC)        Relevant Orders    DG Foot Complete Right (Completed)      -Patient seen and evaluated at bedside, all questions answered with wife on speaker-phone during encounter  -X-rays reviewed and discussed with patient; air in surgical bed is secondary to packing and defect from amputation  -Dressing change performed; Applied dry sterile dressing secured with ACE wrap -Advised patient to make sure to keep dressing clean, dry, and intact to Right  foot -Advised patient to limit weightbearing to necessity and to wear forefoot offloading shoe on right foot at all times when attempting ambulation -Continue with IV antibiotics; Awaiting final micro; anticipate IV antibiotics for an additional 2 weeks since patient has already had 4 weeks of antibiotics via PICC line that was placed on 02-05-15; the final decision for this antibiotic timeline will be determined based on final culture and sensitivities -Podiatry will continue to follow closely  -Anticipated discharge plan of care: Continue local wound care with first visit in office on Monday after discharge; Patient instructed to call office  for an appointment. Discussed with patient that if he remains in-patient past Monday will make rounds to see him here, Will plan to continue HBO treatments on an outpatient bases and care for the patient in conjunction with associate Dr. Jacqualyn Posey and Dr. Con Memos.  Landis Martins, DPM

## 2015-03-06 NOTE — Progress Notes (Signed)
[facility]                                                                              Patient Demographics  [name], is a 50 y.o. male, DOB - Mar 28, 1965, [id number]  Admit date - 03/04/2015   Admitting Physician Waldemar Dickens, MD  Outpatient Primary MD for the patient is Tivis Ringer, MD  LOS - 2   Chief Complaint  Patient presents with  . Wound Infection       Brief HPI  Patient is a 50 year old male with long-standing type 2 diabetes on insulin, presented with right foot diabetic ulcer. Patient underwent right foot I&D and fifth metatarsal partial amputation. Cultures are pending   Assessment & Plan    Type II diabetes mellitus ([place]).  - CBGs fairly controlled, continue sliding scale insulin  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[place]). -s/p amputation by Podiatrist- follow up on Monday in office per Dr. Cannon Kettle - Await final cultures  Type 2 diabetes mellitus - Continue sliding scale insulin, glimepiride, and invokana   Hyperlipidemia.  -Continue Lovaza  Essential hypertension - Currently stable, continue Norvasc, HCTZ, ARB   Code Status: full code  Family Communication: Discussed in detail with the patient, all imaging results, lab results explained to the patient   Disposition Plan: Hopefully dc in  24-48 hours  Time Spent in minutes  25 minutes  Procedures  Right foot incision and drainage with 5th metatarsal partial amputation and removal toe/infected bone and soft tissue with culture  Consults   podiatry  DVT Prophylaxis  heparin   Medications  Scheduled Meds: . irbesartan  300 mg Oral Daily   And  . amLODipine  10 mg Oral Daily   And  . hydrochlorothiazide  25 mg Oral Daily  . aspirin EC  81 mg Oral Daily  . canagliflozin  300 mg Oral Daily  . ceFEPime (MAXIPIME) IV  2 g Intravenous 3 times per day  . glimepiride  2 mg Oral BID WC  . insulin aspart  0-15 Units Subcutaneous TID WC  . insulin  aspart  0-5 Units Subcutaneous QHS  . metFORMIN  1,000 mg Oral BID WC  . methocarbamol  500 mg Oral BID   Continuous Infusions:  PRN Meds:.hydrALAZINE, menthol-cetylpyridinium, ondansetron **OR** ondansetron (ZOFRAN) IV, oxyCODONE-acetaminophen, sodium chloride flush, traMADol   Antibiotics   Anti-infectives    Start     Dose/Rate Route Frequency Ordered Stop   03/05/15 1900  cefTRIAXone (ROCEPHIN) 2 g in dextrose 5 % 50 mL IVPB  Status:  Discontinued     2 g 100 mL/hr over 30 Minutes Intravenous Every 24 hours 03/04/15 2133 03/05/15 1437   03/05/15 1845  levofloxacin (LEVAQUIN) tablet 750 mg  Status:  Discontinued     750 mg Oral Every 24 hours 03/04/15 2131 03/05/15 1437   03/05/15 1600  ceFEPIme (MAXIPIME) 2 g in dextrose 5 % 50 mL IVPB     2 g 100 mL/hr over 30 Minutes Intravenous 3 times per day 03/05/15 1506     03/04/15 2024  polymyxin B 500,000 Units, bacitracin 50,000 Units in sodium chloride irrigation 0.9 %  500 mL irrigation  Status:  Discontinued       As needed 03/04/15 2024 03/04/15 2032   03/04/15 1815  levofloxacin (LEVAQUIN) tablet 750 mg  Status:  Discontinued     750 mg Oral Daily 03/04/15 1803 03/04/15 2130   03/04/15 1815  cefTRIAXone (ROCEPHIN) 1 g in dextrose 5 % 50 mL IVPB  Status:  Discontinued     1 g 100 mL/hr over 30 Minutes Intravenous Every 24 hours 03/04/15 1803 03/04/15 2132        Subjective:   [name] was seen and examined today. Patient denies dizziness, chest pain, shortness of breath, abdominal pain, N/V/D/C, new weakness, numbess, tingling. No acute events overnight.    Objective:   Filed Vitals:   03/05/15 0906 03/05/15 1300 03/05/15 2130 03/06/15 0613  BP: 120/69 114/61 128/64 137/66  Pulse: 83 85 84 81  Temp:  98.1 F (36.7 C) 98.8 F (37.1 C) 98.5 F (36.9 C)  TempSrc:  Oral Oral Oral  Resp:  18 18 18   Height:      Weight:      SpO2:  96% 95% 97%    Intake/Output Summary (Last 24 hours) at 03/06/15 1331 Last  data filed at 03/06/15 0900  Gross per 24 hour  Intake    580 ml  Output   1600 ml  Net  -1020 ml     Wt Readings from Last 3 Encounters:  03/04/15 129.275 kg (285 lb)  02/04/15 130.3 kg (287 lb 4.2 oz)  01/27/15 129.275 kg (285 lb)     Exam  General: Alert and oriented x 3, NAD  HEENT:  PERRLA, EOMI, Anicteric Sclera, mucous membranes moist.   Neck: Supple, no JVD, no masses  CVS: S1 S2 auscultated, no rubs, murmurs or gallops. Regular rate and rhythm.  Respiratory: Clear to auscultation bilaterally, no wheezing, rales or rhonchi  Abdomen: Soft, nontender, nondistended, + bowel sounds  Ext: no cyanosis clubbing or edema, right foot dressing intact  Neuro: AAOx3, Cr N's II- XII. Strength 5/5 upper and lower extremities bilaterally  Skin: No rashes  Psych: Normal affect and demeanor, alert and oriented x3    Data Review   Micro Results Recent Results (from the past 240 hour(s))  Anaerobic culture     Status: None (Preliminary result)   Collection Time: 03/04/15  7:58 PM  Result Value Ref Range Status   Specimen Description TISSUE RIGHT FOOT  Final   Special Requests 5TH METATARSAL PT ON VANC  Final   Gram Stain   Final    NO WBC SEEN MODERATE GRAM NEGATIVE RODS Performed at [facility]    Culture   Final    NO ANAEROBES ISOLATED; CULTURE IN PROGRESS FOR 5 DAYS Performed at [facility]    Report Status PENDING  Incomplete  Tissue culture     Status: None (Preliminary result)   Collection Time: 03/04/15  7:58 PM  Result Value Ref Range Status   Specimen Description TISSUE RIGHT FOOT  Final   Special Requests 5TH METATARSAL PT ON VANC  Final   Gram Stain   Final    NO WBC SEEN MODERATE GRAM NEGATIVE RODS Performed at [facility]    Culture   Final    NO GROWTH 1 DAY Performed at [facility]    Report Status PENDING  Incomplete  Anaerobic culture     Status: None (Preliminary result)   Collection Time: 03/04/15   8:05 PM  Result  Value Ref Range Status   Specimen Description TISSUE BONE RIGHT FOOT  Final   Special Requests 5TH METATARSAL PT ON VANC  Final   Gram Stain   Final    RARE WBC PRESENT, PREDOMINANTLY MONONUCLEAR NO ORGANISMS SEEN Performed at [facility]    Culture   Final    NO ANAEROBES ISOLATED; CULTURE IN PROGRESS FOR 5 DAYS Performed at [facility]    Report Status PENDING  Incomplete  Tissue culture     Status: None (Preliminary result)   Collection Time: 03/04/15  8:05 PM  Result Value Ref Range Status   Specimen Description TISSUE BONE RIGHT FOOT  Final   Special Requests 5TH METETARSAL PT ON VANC  Final   Gram Stain   Final    RARE WBC PRESENT, PREDOMINANTLY MONONUCLEAR NO ORGANISMS SEEN Performed at [facility]    Culture PENDING  Incomplete   Report Status PENDING  Incomplete  Anaerobic culture     Status: None (Preliminary result)   Collection Time: 03/04/15  8:10 PM  Result Value Ref Range Status   Specimen Description WOUND RIGHT FOOT  Final   Special Requests SWAB OF 5TH METATARSAL PT ON VANC  Final   Gram Stain   Final    ABUNDANT WBC PRESENT,BOTH PMN AND MONONUCLEAR NO SQUAMOUS EPITHELIAL CELLS SEEN NO ORGANISMS SEEN Performed at [facility]    Culture   Final    NO ANAEROBES ISOLATED; CULTURE IN PROGRESS FOR 5 DAYS Performed at [facility]    Report Status PENDING  Incomplete  Wound culture     Status: None (Preliminary result)   Collection Time: 03/04/15  8:10 PM  Result Value Ref Range Status   Specimen Description WOUND RIGHT FOOT  Final   Special Requests SWAB OF 5TH METATARSAL PT ON VANC  Final   Gram Stain   Final    ABUNDANT WBC PRESENT,BOTH PMN AND MONONUCLEAR NO SQUAMOUS EPITHELIAL CELLS SEEN NO ORGANISMS SEEN Performed at [facility]    Culture   Final    NO GROWTH 1 DAY Performed at [facility]    Report Status PENDING  Incomplete    Radiology Reports US Venous Img  Lower Unilateral Right  02/26/2015  CLINICAL DATA:  50 year old male with right lower extremity edema and right calf pain EXAM: RIGHT LOWER EXTREMITY VENOUS DOPPLER ULTRASOUND TECHNIQUE: Gray-scale sonography with graded compression, as well as color Doppler and duplex ultrasound were performed to evaluate the lower extremity deep venous systems from the level of the common femoral vein and including the common femoral, femoral, profunda femoral, popliteal and calf veins including the posterior tibial, peroneal and gastrocnemius veins when visible. The superficial great saphenous vein was also interrogated. Spectral Doppler was utilized to evaluate flow at rest and with distal augmentation maneuvers in the common femoral, femoral and popliteal veins. COMPARISON:  None. FINDINGS: Contralateral Common Femoral Vein: Respiratory phasicity is normal and symmetric with the symptomatic side. No evidence of thrombus. Normal compressibility. Common Femoral Vein: No evidence of thrombus. Normal compressibility, respiratory phasicity and response to augmentation. Saphenofemoral Junction: No evidence of thrombus. Normal compressibility and flow on color Doppler imaging. Profunda Femoral Vein: No evidence of thrombus. Normal compressibility and flow on color Doppler imaging. Femoral Vein: No evidence of thrombus. Normal compressibility, respiratory phasicity and response to augmentation. Popliteal Vein: No evidence of thrombus. Normal compressibility, respiratory phasicity and response to augmentation. Calf Veins: No evidence of thrombus. Normal compressibility and flow  on color Doppler imaging. Superficial Great Saphenous Vein: No evidence of thrombus. Normal compressibility and flow on color Doppler imaging. Venous Reflux:  None. Other Findings:  None. IMPRESSION: No evidence of deep venous thrombosis. Electronically Signed   By: Jacqulynn Cadet M.D.   On: 02/26/2015 16:20   Dg Chest Port 1 View  02/05/2015  CLINICAL  DATA:  Evaluate PICC line placement EXAM: PORTABLE CHEST 1 VIEW COMPARISON:  01/14/2015 FINDINGS: Right arm PICC line tip is in the projection of the SVC. The heart size is normal. No pleural effusion or edema. No airspace consolidation identified. IMPRESSION: 1. Right arm PICC line tip is in the SVC. Electronically Signed   By: Kerby Moors M.D.   On: 02/05/2015 11:58   Dg Foot Complete Right  03/04/2015  CLINICAL DATA:  Status post fifth metatarsal amputation EXAM: RIGHT FOOT COMPLETE - 3+ VIEW COMPARISON:  03/04/2015 FINDINGS: There is been interval resection of the majority of the fifth metatarsal as well as the fifth toe. Some air is noted in the surgical bed. No other focal area of bony destruction is noted to suggest osteomyelitis. IMPRESSION: Status post fifth metatarsal and fifth toe amputation. Electronically Signed   By: Inez Catalina M.D.   On: 03/04/2015 21:33   Dg Foot Complete Right  03/04/2015  3 views of a skeletally mature individual were obtained of the right foot. Study includes AP, oblique, lateral projections. There is destructive changes to the head of the 5th metatarsal and to the 5th toe consistent with osteomyelitis. No soft tissue emphysema. There is swelling around the 5th MTPJ. No evidence of acute fracture. Vessel calcification is present.    CBC  Recent Labs Lab 03/04/15 1400 03/06/15 0452  WBC 13.2* 10.5  HGB 15.0 13.4  HCT 43.8 39.9  PLT 267 227  MCV 86.2 87.5  MCH 29.5 29.4  MCHC 34.2 33.6  RDW 13.3 13.7    Chemistries   Recent Labs Lab 03/04/15 1400 03/06/15 0452  NA 137 138  K 4.8 4.5  CL 99* 103  CO2 23 25  GLUCOSE 194* 140*  BUN 27* 26*  CREATININE 1.12 1.01  CALCIUM 9.3 8.9  AST 24  --   ALT 18  --   ALKPHOS 63  --   BILITOT 0.6  --    ------------------------------------------------------------------------------------------------------------------ estimated creatinine clearance is 126.4 mL/min (by C-G formula based on Cr of  1.01). ------------------------------------------------------------------------------------------------------------------  Recent Labs  03/04/15 2121  HGBA1C 7.6*   ------------------------------------------------------------------------------------------------------------------ No results for input(s): CHOL, HDL, LDLCALC, TRIG, CHOLHDL, LDLDIRECT in the last 72 hours. ------------------------------------------------------------------------------------------------------------------ No results for input(s): TSH, T4TOTAL, T3FREE, THYROIDAB in the last 72 hours.  Invalid input(s): FREET3 ------------------------------------------------------------------------------------------------------------------ No results for input(s): VITAMINB12, FOLATE, FERRITIN, TIBC, IRON, RETICCTPCT in the last 72 hours.  Coagulation profile  Recent Labs Lab 03/04/15 2121  INR 1.09    No results for input(s): DDIMER in the last 72 hours.  Cardiac Enzymes No results for input(s): CKMB, TROPONINI, MYOGLOBIN in the last 168 hours.  Invalid input(s): CK ------------------------------------------------------------------------------------------------------------------ Invalid input(s): Elizabeth  03/05/15 0635 03/05/15 1147 03/05/15 1653 03/05/15 2132 03/06/15 0616 03/06/15 1134  GLUCAP 83 227* 109* 196* 132* 167*     Emmerson Taddei M.D. [facility] 03/06/2015, 1:31 PM  Pager: [phone] Between 7am to 7pm - call Pager - [phone]  After 7pm go to [email or web address] - password [email or web address]  Call night coverage person covering after 7pm

## 2015-03-07 ENCOUNTER — Inpatient Hospital Stay (HOSPITAL_COMMUNITY): Payer: Managed Care, Other (non HMO)

## 2015-03-07 LAB — WOUND CULTURE: Culture: NO GROWTH

## 2015-03-07 LAB — CBC
HCT: 40.3 % (ref 39.0–52.0)
Hemoglobin: 13.4 g/dL (ref 13.0–17.0)
MCH: 28.6 pg (ref 26.0–34.0)
MCHC: 33.3 g/dL (ref 30.0–36.0)
MCV: 86.1 fL (ref 78.0–100.0)
Platelets: 226 10*3/uL (ref 150–400)
RBC: 4.68 MIL/uL (ref 4.22–5.81)
RDW: 13.3 % (ref 11.5–15.5)
WBC: 12.2 10*3/uL — ABNORMAL HIGH (ref 4.0–10.5)

## 2015-03-07 LAB — BASIC METABOLIC PANEL
Anion gap: 11 (ref 5–15)
BUN: 20 mg/dL (ref 6–20)
CO2: 24 mmol/L (ref 22–32)
Calcium: 8.9 mg/dL (ref 8.9–10.3)
Chloride: 100 mmol/L — ABNORMAL LOW (ref 101–111)
Creatinine, Ser: 0.87 mg/dL (ref 0.61–1.24)
GFR calc Af Amer: >60 mL/min (ref >60)
GFR calc non Af Amer: >60 mL/min (ref >60)
Glucose, Bld: 112 mg/dL — ABNORMAL HIGH (ref 65–99)
Potassium: 4.6 mmol/L (ref 3.5–5.1)
Sodium: 135 mmol/L (ref 135–145)

## 2015-03-07 LAB — GLUCOSE, CAPILLARY
Glucose-Capillary: 138 mg/dL — ABNORMAL HIGH (ref 65–99)
Glucose-Capillary: 102 mg/dL — ABNORMAL HIGH (ref 65–99)
Glucose-Capillary: 64 mg/dL — ABNORMAL LOW (ref 65–99)
Glucose-Capillary: 123 mg/dL — ABNORMAL HIGH (ref 65–99)

## 2015-03-07 MED ORDER — ACETAMINOPHEN 325 MG PO TABS
650.0000 mg | ORAL_TABLET | Freq: Four times a day (QID) | ORAL | Status: DC | PRN
  Administered 2015-03-07 – 2015-03-09 (×4): 650 mg via ORAL
  Filled 2015-03-07 (×4): qty 2

## 2015-03-07 MED ORDER — PROMETHAZINE HCL 25 MG/ML IJ SOLN
12.5000 mg | Freq: Four times a day (QID) | INTRAMUSCULAR | Status: DC | PRN
  Administered 2015-03-08: 12.5 mg via INTRAVENOUS
  Filled 2015-03-07: qty 1

## 2015-03-07 MED ORDER — GUAIFENESIN-DM 100-10 MG/5ML PO SYRP
5.0000 mL | ORAL_SOLUTION | ORAL | Status: DC | PRN
  Administered 2015-03-07 (×2): 5 mL via ORAL
  Filled 2015-03-07 (×2): qty 5

## 2015-03-07 MED ORDER — VANCOMYCIN HCL 10 G IV SOLR
1250.0000 mg | Freq: Two times a day (BID) | INTRAVENOUS | Status: DC
  Administered 2015-03-07 – 2015-03-11 (×7): 1250 mg via INTRAVENOUS
  Filled 2015-03-07 (×10): qty 1250

## 2015-03-07 NOTE — Progress Notes (Signed)
Pharmacy Antibiotic Note  [name] is a 50 y.o. male admitted on 03/04/2015 with diabetic foot ucler with osteomyeletis.  Diabetic foot ulcer w/ osteomyelitis, s/p amputation.  Culture from 4 wks ago showed MSSA/Serratia.   Scr stable, WBC stable, Tmax=100 Awaiting final cultures  Plan: Continue Cefepime 2 gm IV q8h Adding Vancomycin 03/07/15 - 1250 mg iv Q 12 hours  Height: 6\' 2"  (188 cm) Weight: 285 lb (129.275 kg) IBW/kg (Calculated) : 82.2  Temp (24hrs), Avg:99.4 F (37.4 C), Min:99.1 F (37.3 C), Max:100 F (37.8 C)   Recent Labs Lab 03/04/15 1400 03/04/15 2120 03/06/15 0452 03/07/15 0600  WBC 13.2*  --  10.5 12.2*  CREATININE 1.12  --  1.01 0.87  LATICACIDVEN  --  1.3  --   --     Estimated Creatinine Clearance: 146.7 mL/min (by C-G formula based on Cr of 0.87).    No Known Allergies  Antimicrobials this admission: 3/2 CXT >> 3/3 3/2 lv q  >> 3/3 Cefepime 3/3>> Vancomycin 3/5>  Dose adjustments this admission: None  Microbiology results: 3/2 R foot wound>> 3/2 R foot tissue>>mod GNR 2/20 toe NGF 2/3 R foot>> staph aureus/serratia marcescens  Thank you Anette Guarneri, PharmD [phone]  03/07/2015 12:32 PM

## 2015-03-07 NOTE — Progress Notes (Signed)
Pharmacy Antibiotic Note  [name] is a 50 y.o. male admitted on 03/04/2015 with diabetic foot ucler with osteomyeletis.  Diabetic foot ulcer w/ osteomyelitis, s/p amputation.  Culture from 4 wks ago showed MSSA/Serratia.   Scr stable, WBC stable, Tmax=100 Awaiting final cultures  Plan: Continue Cefepime 2 gm IV q8h  Height: 6\' 2"  (188 cm) Weight: 285 lb (129.275 kg) IBW/kg (Calculated) : 82.2  Temp (24hrs), Avg:99.4 F (37.4 C), Min:99.1 F (37.3 C), Max:100 F (37.8 C)   Recent Labs Lab 03/04/15 1400 03/04/15 2120 03/06/15 0452 03/07/15 0600  WBC 13.2*  --  10.5 12.2*  CREATININE 1.12  --  1.01 0.87  LATICACIDVEN  --  1.3  --   --     Estimated Creatinine Clearance: 146.7 mL/min (by C-G formula based on Cr of 0.87).    No Known Allergies  Antimicrobials this admission: 3/2 CXT >> 3/3 3/2 lv q  >> 3/3 Cefepime 3/3>>  Dose adjustments this admission:   Microbiology results: 3/2 R foot wound>> 3/2 R foot tissue>>mod GNR 2/20 toe NGF 2/3 R foot>> staph aureus/serratia marcescens  Thank you Anette Guarneri, PharmD [phone]  03/07/2015 11:42 AM

## 2015-03-07 NOTE — Progress Notes (Addendum)
[facility]                                                                             [name], is a 50 y.o. male, DOB - 1965/02/28, [id number] Outpatient Primary MD for the patient is Tivis Ringer, MD  Chief Complaint  Patient presents with  . Wound Infection       Brief HPI  Patient is a 50 year old male with long-standing type 2 diabetes on insulin, presented with right foot diabetic ulcer. Patient underwent right foot I&D and fifth metatarsal partial amputation. Cultures are pending Feels nauseated with fever today  Assessment & Plan  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[place]). -s/p amputation by Podiatrist on 3/2, recommends 2 more weeks of Abx based on cultures -tissue Cx with GNR and re-incubated for better growth  -wound care, follow up on Monday in office per Dr. Cannon Kettle -Await final cultures -currently only on cefepime, add Vanc pending Cx data  Type 2 diabetes mellitus - stable, Continue sliding scale insulin, glimepiride, and invokana  -hbaic 7.6  Hyperlipidemia.  -Continue Lovaza  Essential hypertension - Currently stable, continue Norvasc, HCTZ, ARB  Fever/cough with green sputum -suspect atelectasis -will check CXR -incentive spirometry  Code Status: full code  Family Communication: Discussed in detail with the patient, all imaging results, lab results explained to the patient   Disposition Plan: Hopefully dc in  24-48 hours  Time Spent in minutes  25 minutes  Procedures  Right foot incision and drainage with 5th metatarsal partial amputation and removal toe/infected bone and soft tissue with culture  Consults   podiatry  DVT Prophylaxis  heparin   Medications  Scheduled Meds: . irbesartan  300 mg Oral Daily   And  . amLODipine  10 mg Oral Daily   And  . hydrochlorothiazide  25 mg Oral Daily  . aspirin EC  81 mg Oral Daily  . canagliflozin  300 mg Oral Daily  . ceFEPime (MAXIPIME) IV  2 g Intravenous 3  times per day  . glimepiride  2 mg Oral BID WC  . heparin subcutaneous  5,000 Units Subcutaneous 3 times per day  . insulin aspart  0-15 Units Subcutaneous TID WC  . insulin aspart  0-5 Units Subcutaneous QHS  . metFORMIN  1,000 mg Oral BID WC  . methocarbamol  500 mg Oral BID  . omega-3 acid ethyl esters  2 g Oral Daily   Continuous Infusions:  PRN Meds:.acetaminophen, guaiFENesin-dextromethorphan, hydrALAZINE, menthol-cetylpyridinium, ondansetron **OR** ondansetron (ZOFRAN) IV, oxyCODONE-acetaminophen, promethazine, sodium chloride flush, traMADol   Antibiotics   Anti-infectives    Start     Dose/Rate Route Frequency Ordered Stop   03/05/15 1900  cefTRIAXone (ROCEPHIN) 2 g in dextrose 5 % 50 mL IVPB  Status:  Discontinued     2 g 100 mL/hr over 30 Minutes Intravenous Every 24 hours 03/04/15 2133 03/05/15 1437   03/05/15 1845  levofloxacin (LEVAQUIN) tablet 750 mg  Status:  Discontinued     750 mg Oral Every 24 hours 03/04/15 2131 03/05/15 1437   03/05/15 1600  ceFEPIme (MAXIPIME) 2 g in dextrose 5 % 50 mL IVPB     2 g 100 mL/hr over  30 Minutes Intravenous 3 times per day 03/05/15 1506     03/04/15 2024  polymyxin B 500,000 Units, bacitracin 50,000 Units in sodium chloride irrigation 0.9 % 500 mL irrigation  Status:  Discontinued       As needed 03/04/15 2024 03/04/15 2032   03/04/15 1815  levofloxacin (LEVAQUIN) tablet 750 mg  Status:  Discontinued     750 mg Oral Daily 03/04/15 1803 03/04/15 2130   03/04/15 1815  cefTRIAXone (ROCEPHIN) 1 g in dextrose 5 % 50 mL IVPB  Status:  Discontinued     1 g 100 mL/hr over 30 Minutes Intravenous Every 24 hours 03/04/15 1803 03/04/15 2132        Subjective:   Complains of feeling sick, nauseated, couldn't eat today, temp of 100  Objective:   Filed Vitals:   03/06/15 1500 03/06/15 2058 03/07/15 0500 03/07/15 1031  BP: 124/57 135/58 145/57   Pulse: 87 84 96   Temp:  99.1 F (37.3 C) 99.1 F (37.3 C) 100 F (37.8 C)  TempSrc:   Oral Oral Oral  Resp: 20 20 20    Height:      Weight:      SpO2: 96% 94% 95%     Intake/Output Summary (Last 24 hours) at 03/07/15 1206 Last data filed at 03/07/15 0615  Gross per 24 hour  Intake    820 ml  Output   1200 ml  Net   -380 ml     Wt Readings from Last 3 Encounters:  03/04/15 129.275 kg (285 lb)  02/04/15 130.3 kg (287 lb 4.2 oz)  01/27/15 129.275 kg (285 lb)     Exam  General: Alert and oriented x 3, NAD  HEENT:  PERRLA, EOMI, Anicteric Sclera, mucous membranes moist.   Neck: Supple, no JVD, no masses  CVS: S1 S2 auscultated, no rubs, murmurs or gallops. Regular rate and rhythm.  Respiratory: Clear to auscultation bilaterally, no wheezing, rales or rhonchi  Abdomen: Soft, nontender, nondistended, + bowel sounds  Ext: no cyanosis clubbing or edema, right foot dressing intact  Neuro: AAOx3, Cr N's II- XII. Strength 5/5 upper and lower extremities bilaterally  Skin: No rashes  Psych: Normal affect and demeanor, alert and oriented x3    Data Review   Micro Results Recent Results (from the past 240 hour(s))  Anaerobic culture     Status: None (Preliminary result)   Collection Time: 03/04/15  7:58 PM  Result Value Ref Range Status   Specimen Description TISSUE RIGHT FOOT  Final   Special Requests 5TH METATARSAL PT ON VANC  Final   Gram Stain   Final    NO WBC SEEN MODERATE GRAM NEGATIVE RODS Performed at [facility]    Culture   Final    NO ANAEROBES ISOLATED; CULTURE IN PROGRESS FOR 5 DAYS Performed at [facility]    Report Status PENDING  Incomplete  Tissue culture     Status: None (Preliminary result)   Collection Time: 03/04/15  7:58 PM  Result Value Ref Range Status   Specimen Description TISSUE RIGHT FOOT  Final   Special Requests 5TH METATARSAL PT ON VANC  Final   Gram Stain   Final    NO WBC SEEN MODERATE GRAM NEGATIVE RODS Performed at [facility]   Final    Culture reincubated for better  growth Performed at [facility]    Report Status PENDING  Incomplete  Anaerobic culture  Status: None (Preliminary result)   Collection Time: 03/04/15  8:05 PM  Result Value Ref Range Status   Specimen Description TISSUE BONE RIGHT FOOT  Final   Special Requests 5TH METATARSAL PT ON VANC  Final   Gram Stain   Final    RARE WBC PRESENT, PREDOMINANTLY MONONUCLEAR NO ORGANISMS SEEN Performed at [facility]    Culture   Final    NO ANAEROBES ISOLATED; CULTURE IN PROGRESS FOR 5 DAYS Performed at [facility]    Report Status PENDING  Incomplete  Tissue culture     Status: None (Preliminary result)   Collection Time: 03/04/15  8:05 PM  Result Value Ref Range Status   Specimen Description TISSUE BONE RIGHT FOOT  Final   Special Requests 5TH METETARSAL PT ON VANC  Final   Gram Stain   Final    RARE WBC PRESENT, PREDOMINANTLY MONONUCLEAR NO ORGANISMS SEEN Performed at [facility]    Culture PENDING  Incomplete   Report Status PENDING  Incomplete  Anaerobic culture     Status: None (Preliminary result)   Collection Time: 03/04/15  8:10 PM  Result Value Ref Range Status   Specimen Description WOUND RIGHT FOOT  Final   Special Requests SWAB OF 5TH METATARSAL PT ON VANC  Final   Gram Stain   Final    ABUNDANT WBC PRESENT,BOTH PMN AND MONONUCLEAR NO SQUAMOUS EPITHELIAL CELLS SEEN NO ORGANISMS SEEN Performed at [facility]    Culture   Final    NO ANAEROBES ISOLATED; CULTURE IN PROGRESS FOR 5 DAYS Performed at [facility]    Report Status PENDING  Incomplete  Wound culture     Status: None   Collection Time: 03/04/15  8:10 PM  Result Value Ref Range Status   Specimen Description WOUND RIGHT FOOT  Final   Special Requests SWAB OF 5TH METATARSAL PT ON VANC  Final   Gram Stain   Final    ABUNDANT WBC PRESENT,BOTH PMN AND MONONUCLEAR NO SQUAMOUS EPITHELIAL CELLS SEEN NO ORGANISMS SEEN Performed at [facility]     Culture   Final    NO GROWTH 2 DAYS Performed at [facility]    Report Status 03/07/2015 FINAL  Final    Radiology Reports US Venous Img Lower Unilateral Right  02/26/2015  CLINICAL DATA:  50 year old male with right lower extremity edema and right calf pain EXAM: RIGHT LOWER EXTREMITY VENOUS DOPPLER ULTRASOUND TECHNIQUE: Gray-scale sonography with graded compression, as well as color Doppler and duplex ultrasound were performed to evaluate the lower extremity deep venous systems from the level of the common femoral vein and including the common femoral, femoral, profunda femoral, popliteal and calf veins including the posterior tibial, peroneal and gastrocnemius veins when visible. The superficial great saphenous vein was also interrogated. Spectral Doppler was utilized to evaluate flow at rest and with distal augmentation maneuvers in the common femoral, femoral and popliteal veins. COMPARISON:  None. FINDINGS: Contralateral Common Femoral Vein: Respiratory phasicity is normal and symmetric with the symptomatic side. No evidence of thrombus. Normal compressibility. Common Femoral Vein: No evidence of thrombus. Normal compressibility, respiratory phasicity and response to augmentation. Saphenofemoral Junction: No evidence of thrombus. Normal compressibility and flow on color Doppler imaging. Profunda Femoral Vein: No evidence of thrombus. Normal compressibility and flow on color Doppler imaging. Femoral Vein: No evidence of thrombus. Normal compressibility, respiratory phasicity and response to augmentation. Popliteal Vein: No evidence of thrombus. Normal compressibility, respiratory phasicity and  response to augmentation. Calf Veins: No evidence of thrombus. Normal compressibility and flow on color Doppler imaging. Superficial Great Saphenous Vein: No evidence of thrombus. Normal compressibility and flow on color Doppler imaging. Venous Reflux:  None. Other Findings:  None. IMPRESSION: No  evidence of deep venous thrombosis. Electronically Signed   By: Jacqulynn Cadet M.D.   On: 02/26/2015 16:20   Dg Foot Complete Right  03/04/2015  CLINICAL DATA:  Status post fifth metatarsal amputation EXAM: RIGHT FOOT COMPLETE - 3+ VIEW COMPARISON:  03/04/2015 FINDINGS: There is been interval resection of the majority of the fifth metatarsal as well as the fifth toe. Some air is noted in the surgical bed. No other focal area of bony destruction is noted to suggest osteomyelitis. IMPRESSION: Status post fifth metatarsal and fifth toe amputation. Electronically Signed   By: Inez Catalina M.D.   On: 03/04/2015 21:33   Dg Foot Complete Right  03/04/2015  3 views of a skeletally mature individual were obtained of the right foot. Study includes AP, oblique, lateral projections. There is destructive changes to the head of the 5th metatarsal and to the 5th toe consistent with osteomyelitis. No soft tissue emphysema. There is swelling around the 5th MTPJ. No evidence of acute fracture. Vessel calcification is present.    CBC  Recent Labs Lab 03/04/15 1400 03/06/15 0452 03/07/15 0600  WBC 13.2* 10.5 12.2*  HGB 15.0 13.4 13.4  HCT 43.8 39.9 40.3  PLT 267 227 226  MCV 86.2 87.5 86.1  MCH 29.5 29.4 28.6  MCHC 34.2 33.6 33.3  RDW 13.3 13.7 13.3    Chemistries   Recent Labs Lab 03/04/15 1400 03/06/15 0452 03/07/15 0600  NA 137 138 135  K 4.8 4.5 4.6  CL 99* 103 100*  CO2 23 25 24   GLUCOSE 194* 140* 112*  BUN 27* 26* 20  CREATININE 1.12 1.01 0.87  CALCIUM 9.3 8.9 8.9  AST 24  --   --   ALT 18  --   --   ALKPHOS 63  --   --   BILITOT 0.6  --   --    ------------------------------------------------------------------------------------------------------------------ estimated creatinine clearance is 146.7 mL/min (by C-G formula based on Cr of 0.87). ------------------------------------------------------------------------------------------------------------------  Recent Labs   03/04/15 2121  HGBA1C 7.6*   ------------------------------------------------------------------------------------------------------------------ No results for input(s): CHOL, HDL, LDLCALC, TRIG, CHOLHDL, LDLDIRECT in the last 72 hours. ------------------------------------------------------------------------------------------------------------------ No results for input(s): TSH, T4TOTAL, T3FREE, THYROIDAB in the last 72 hours.  Invalid input(s): FREET3 ------------------------------------------------------------------------------------------------------------------ No results for input(s): VITAMINB12, FOLATE, FERRITIN, TIBC, IRON, RETICCTPCT in the last 72 hours.  Coagulation profile  Recent Labs Lab 03/04/15 2121  INR 1.09    No results for input(s): DDIMER in the last 72 hours.  Cardiac Enzymes No results for input(s): CKMB, TROPONINI, MYOGLOBIN in the last 168 hours.  Invalid input(s): CK ------------------------------------------------------------------------------------------------------------------ Invalid input(s): [place]  03/05/15 1653 03/05/15 2132 03/06/15 0616 03/06/15 1134 03/06/15 1607 03/06/15 2100  GLUCAP 109* 196* 132* 167* 112* 113Domenic Polite M.D. [facility] 03/07/2015, 12:06 PM  Pager: [phone] Between 7am to 7pm - call Pager - [phone]  After 7pm go to [email or web address] - password [email or web address]  Call night coverage person covering after 7pm

## 2015-03-08 ENCOUNTER — Telehealth: Payer: Self-pay | Admitting: Sports Medicine

## 2015-03-08 ENCOUNTER — Encounter: Payer: Managed Care, Other (non HMO) | Admitting: Surgery

## 2015-03-08 DIAGNOSIS — M9080 Osteopathy in diseases classified elsewhere, unspecified site: Secondary | ICD-10-CM

## 2015-03-08 DIAGNOSIS — R112 Nausea with vomiting, unspecified: Secondary | ICD-10-CM | POA: Diagnosis not present

## 2015-03-08 DIAGNOSIS — M86179 Other acute osteomyelitis, unspecified ankle and foot: Secondary | ICD-10-CM

## 2015-03-08 DIAGNOSIS — R509 Fever, unspecified: Secondary | ICD-10-CM | POA: Diagnosis present

## 2015-03-08 LAB — CBC
HCT: 39.9 % (ref 39.0–52.0)
Hemoglobin: 13.3 g/dL (ref 13.0–17.0)
MCH: 28.9 pg (ref 26.0–34.0)
MCHC: 33.3 g/dL (ref 30.0–36.0)
MCV: 86.6 fL (ref 78.0–100.0)
Platelets: 221 10*3/uL (ref 150–400)
RBC: 4.61 MIL/uL (ref 4.22–5.81)
RDW: 13.3 % (ref 11.5–15.5)
WBC: 11 10*3/uL — ABNORMAL HIGH (ref 4.0–10.5)

## 2015-03-08 LAB — GLUCOSE, CAPILLARY
Glucose-Capillary: 124 mg/dL — ABNORMAL HIGH (ref 65–99)
Glucose-Capillary: 94 mg/dL (ref 65–99)
Glucose-Capillary: 101 mg/dL — ABNORMAL HIGH (ref 65–99)

## 2015-03-08 LAB — BASIC METABOLIC PANEL
Anion gap: 10 (ref 5–15)
BUN: 19 mg/dL (ref 6–20)
CO2: 29 mmol/L (ref 22–32)
Calcium: 8.6 mg/dL — ABNORMAL LOW (ref 8.9–10.3)
Chloride: 98 mmol/L — ABNORMAL LOW (ref 101–111)
Creatinine, Ser: 0.86 mg/dL (ref 0.61–1.24)
GFR calc Af Amer: >60 mL/min (ref >60)
GFR calc non Af Amer: >60 mL/min (ref >60)
Glucose, Bld: 113 mg/dL — ABNORMAL HIGH (ref 65–99)
Potassium: 4.5 mmol/L (ref 3.5–5.1)
Sodium: 137 mmol/L (ref 135–145)

## 2015-03-08 LAB — INFLUENZA PANEL BY PCR (TYPE A & B)
H1N1 flu by pcr: NOT DETECTED
Influenza A By PCR: POSITIVE — AB
Influenza B By PCR: NEGATIVE

## 2015-03-08 MED ORDER — HEPARIN SOD (PORK) LOCK FLUSH 100 UNIT/ML IV SOLN
250.0000 [IU] | INTRAVENOUS | Status: DC | PRN
  Administered 2015-03-08: 13:00:00

## 2015-03-08 MED ORDER — OXYCODONE-ACETAMINOPHEN 5-325 MG PO TABS: 1.0000 | ORAL_TABLET | Freq: Four times a day (QID) | ORAL | Status: DC | PRN

## 2015-03-08 MED ORDER — CEFTRIAXONE SODIUM 1 G IJ SOLR: 2.0000 g | Freq: Once | INTRAMUSCULAR | Status: DC

## 2015-03-08 MED ORDER — ACETAMINOPHEN 325 MG PO TABS: 650.0000 mg | ORAL_TABLET | Freq: Four times a day (QID) | ORAL | Status: DC | PRN

## 2015-03-08 MED ORDER — BISACODYL 10 MG RE SUPP
10.0000 mg | Freq: Once | RECTAL | Status: AC
  Administered 2015-03-08: 10 mg via RECTAL
  Filled 2015-03-08: qty 1

## 2015-03-08 MED ORDER — OSELTAMIVIR PHOSPHATE 75 MG PO CAPS
75.0000 mg | ORAL_CAPSULE | Freq: Two times a day (BID) | ORAL | Status: DC
  Administered 2015-03-08 – 2015-03-11 (×6): 75 mg via ORAL
  Filled 2015-03-08 (×9): qty 1

## 2015-03-08 MED ORDER — ONDANSETRON HCL 4 MG PO TABS: 4.0000 mg | ORAL_TABLET | Freq: Four times a day (QID) | ORAL | Status: DC | PRN

## 2015-03-08 MED ORDER — SODIUM CHLORIDE 0.9 % IV SOLN: 1250.0000 mg | Freq: Two times a day (BID) | INTRAVENOUS | Status: DC

## 2015-03-08 NOTE — Progress Notes (Signed)
[name], [name] ([id number]) Visit Report for 02/19/2015 HBO Details [name], [name] 02/19/2015 1:30 Patient Name: Date of Service: [name] Medical Record Patient Account Number: [id number] Number: Treating RN: Date of Birth/Sex: 09-30-1965 (50 y.o. Male) Other Clinician: [name] Primary Care Physician: [name] Treating Britto, Errol Referring Physician: [name] Physician/Extender: Suella Grove in Treatment: 6 HBO Treatment Course Details Treatment Course Ordering Physician: Christin Fudge 1 Number: HBO Treatment Start Date: 02/01/2015 Total Treatments 30 Ordered: HBO Indication: Diabetic Ulcer(s) of the Lower Extremity HBO Treatment Details Treatment Number: 14 Patient Type: Outpatient Chamber Type: Monoplace Chamber Serial #: [id number] Treatment Protocol: 2.0 ATA with 90 minutes oxygen, and no air breaks Treatment Details Compression Rate Down: 1.5 psi / minute De-Compression Rate Up: 1.5 psi / minute Air breaks and breathing Compress Tx Pressure periods Decompress Decompress Begins Reached (leave unused spaces Begins Ends blank) Chamber Pressure (ATA) 1 2 - - - - - - 2 1 Clock Time (24 hr) 12:51 13:04 - - - - - - 14:34 14:45 Treatment Length: 114 (minutes) Treatment Segments: 4 Capillary Blood Glucose Pre Capillary Blood Glucose (mg/dl): Post Capillary Blood Glucose (mg/dl): Vital Signs Capillary Blood Glucose Reference Range: 80 - 120 mg / dl HBO Diabetic Blood Glucose Intervention Range: <131 mg/dl or >249 mg/dl Capillary Time Pulse Blood Respiratory Blood Glucose Action Type: Vitals Pulse: Temperature: Oximetry Pressure: Rate: Glucose Meter #: Taken: Taken: (%) (mg/dl): Gave pat. Ensure;had an apple and Pre 12:43 122/74 108 18 98.1 133 1 yogurt for lunch Post 14:49 122/70 78 18 98.6 187 1 none [name], [name] J. ([id number]) Treatment Response Treatment Completion Status: Treatment Completed without Adverse  Event HBO Attestation I certify that I supervised this HBO treatment in accordance with Medicare guidelines. A trained Yes emergency response team is readily available per hospital policies and procedures. Continue HBOT as ordered. Yes Electronic Signature(s) Signed: 02/22/2015 2:58:24 [name] By: Christin Fudge MD, FACS Signed: 03/08/2015 10:13:55 AM By: Judene Companion MD Entered By: Judene Companion on 02/19/2015 16:23:13 [name], [name] ([id number]) -------------------------------------------------------------------------------- HBO Safety Checklist Details [name], [name] 02/19/2015 1:30 Patient Name: Date of Service: [name] Medical Record Patient Account Number: [id number] Number: Treating RN: Date of Birth/Sex: [birth year] (50 y.o. Male) Other Clinician: [name] Primary Care Physician: [name] Treating Britto, Errol Referring Physician: [name] Physician/Extender: Suella Grove in Treatment: 6 HBO Safety Checklist Items Safety Checklist Consent Form Signed Patient voided / foley secured and emptied When did you last eato 12:15 [name] Last dose of injectable or oral agent 08:00 am NA Ostomy pouch emptied and vented if applicable NA All implantable devices assessed, documented and approved Intravenous access site secured and place PICC line Valuables secured Linens and cotton and cotton/polyester blend (less than 51% polyester) Personal oil-based products / skin lotions / body lotions removed Wigs or hairpieces removed Smoking or tobacco materials removed Books / newspapers / magazines / loose paper removed Cologne, aftershave, perfume and deodorant removed Jewelry removed (may wrap wedding band) Make-up removed Hair care products removed Battery operated devices (external) removed Heating patches and chemical warmers removed NA Titanium eyewear removed NA Nail polish cured greater than 10 hours NA Casting material cured greater than 10  hours NA Hearing aids removed NA Loose dentures or partials removed NA Prosthetics have been removed Patient demonstrates correct use of air break device (if applicable) Patient concerns have been addressed Patient grounding bracelet on and cord attached to chamber Specifics for Inpatients (complete in addition to above) Medication sheet sent with patient  [name], [name] ([id number]) Intravenous medications needed or due during therapy sent with patient Drainage tubes (e.g. nasogastric tube or chest tube secured and vented) Endotracheal or Tracheotomy tube secured Cuff deflated of air and inflated with saline Airway suctioned Electronic Signature(s) Signed: 03/08/2015 10:13:55 AM By: Judene Companion MD Entered By: Judene Companion on 02/19/2015 16:22:59

## 2015-03-08 NOTE — Consult Note (Signed)
Subjective: [name] [name] is a 50 y.o. diabetic male patient seen at bedside, resting comfortably in no acute distress s/p right foot incision and drainage with partial amputation of 5th metatarsal and culture, performed on 03-04-15.  Patient denies pain at surgical site, denies calf pain, denies headache, chest pain, shortness of breath, denies problems with voiding. Admits fever, nausea, vomitting, temperature, and loss of appetite that is improving . No other issues noted.   Patient Active Problem List   Diagnosis Date Noted  . Hyperlipidemia 03/04/2015  . Osteomyelitis ([place]) 03/04/2015  . 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[name] [name]) 02/19/2015  . Foot osteomyelitis, right ([place]) 02/04/2015  . Type II diabetes mellitus ([place]) 07/10/2007  . HYPERTENSION 07/10/2007    Current facility-administered medications:  .  acetaminophen (TYLENOL) tablet 650 mg, 650 mg, Oral, Q6H PRN, Domenic Polite, MD, 650 mg at 03/08/15 0829 .  irbesartan (AVAPRO) tablet 300 mg, 300 mg, Oral, Daily, 300 mg at 03/08/15 0830 **AND** amLODipine (NORVASC) tablet 10 mg, 10 mg, Oral, Daily, 10 mg at 03/08/15 0830 **AND** hydrochlorothiazide (HYDRODIURIL) tablet 25 mg, 25 mg, Oral, Daily, Waldemar Dickens, MD, 25 mg at 03/08/15 0830 .  aspirin EC tablet 81 mg, 81 mg, Oral, Daily, Waldemar Dickens, MD, 81 mg at 03/08/15 0830 .  canagliflozin [facility]) tablet 300 mg, 300 mg, Oral, Daily, Jessica U Vann, DO, 300 mg at 03/08/15 0830 .  ceFEPIme (MAXIPIME) 2 g in dextrose 5 % 50 mL IVPB, 2 g, Intravenous, 3 times per day, Eudelia Bunch, RPH, 2 g at 03/08/15 [id number] .  glimepiride (AMARYL) tablet 2 mg, 2 mg, Oral, BID WC, Jessica U Vann, DO, 2 mg at 03/08/15 0830 .  guaiFENesin-dextromethorphan (ROBITUSSIN DM) 100-10 MG/5ML syrup 5 mL, 5 mL, Oral, Q4H PRN, Dianne Dun, NP, 5 mL at 03/07/15 0902 .  heparin injection 5,000 Units, 5,000 Units, Subcutaneous, 3 times per day, Ripudeep Krystal Eaton, MD, 5,000 Units at 03/08/15  0600 .  hydrALAZINE (APRESOLINE) injection 5-10 mg, 5-10 mg, Intravenous, Q4H PRN, Waldemar Dickens, MD .  insulin aspart (novoLOG) injection 0-15 Units, 0-15 Units, Subcutaneous, TID WC, Waldemar Dickens, MD, 2 Units at 03/08/15 0831 .  insulin aspart (novoLOG) injection 0-5 Units, 0-5 Units, Subcutaneous, QHS, Waldemar Dickens, MD .  menthol-cetylpyridinium (CEPACOL) lozenge 3 mg, 1 lozenge, Oral, PRN, Dionne Milo, NP, 3 mg at 03/05/15 2135 .  metFORMIN (GLUCOPHAGE) tablet 1,000 mg, 1,000 mg, Oral, BID WC, Jessica U Vann, DO, 1,000 mg at 03/08/15 0830 .  methocarbamol (ROBAXIN) tablet 500 mg, 500 mg, Oral, BID, Waldemar Dickens, MD, 500 mg at 03/08/15 1000 .  omega-3 acid ethyl esters (LOVAZA) capsule 2 g, 2 g, Oral, Daily, Ripudeep K Rai, MD, 2 g at 03/08/15 0830 .  ondansetron (ZOFRAN) tablet 4 mg, 4 mg, Oral, Q6H PRN **OR** ondansetron (ZOFRAN) injection 4 mg, 4 mg, Intravenous, Q6H PRN, Waldemar Dickens, MD, 4 mg at 03/08/15 0824 .  oxyCODONE-acetaminophen (PERCOCET/ROXICET) 5-325 MG per tablet 1 tablet, 1 tablet, Oral, Q6H PRN, Waldemar Dickens, MD, 1 tablet at 03/07/15 2202 .  promethazine (PHENERGAN) injection 12.5 mg, 12.5 mg, Intravenous, Q6H PRN, Domenic Polite, MD .  sodium chloride flush (NS) 0.9 % injection 10-40 mL, 10-40 mL, Intracatheter, PRN, Geradine Girt, DO .  traMADol (ULTRAM) tablet 50 mg, 50 mg, Oral, Q6H PRN, Waldemar Dickens, MD, 50 mg at 03/06/15 2316 .  vancomycin (VANCOCIN) 1,250 mg in sodium chloride 0.9 % 250 mL IVPB,  1,250 mg, Intravenous, Q12H, Domenic Polite, MD, 1,250 mg at 03/08/15 0622  No Known Allergies   Objective: Today's Vitals   03/07/15 2057 03/07/15 2202 03/07/15 2302 03/08/15 0555  BP: 140/59   129/66  Pulse: 98   73  Temp: 99.3 F (37.4 C)   99.3 F (37.4 C)  TempSrc: Oral   Oral  Resp: 18   18  Height:      Weight:      SpO2: 94%   95%  PainSc:  6  3     Physical Exam General: No acute distress, A+Ox 3  Focused Right Lower  extremity: Dressing to Right foot clean, dry, intact. No strikethrough noted, Upon removal of dressing, At the dorsal lateral incision the sutures are intact to proximal incision line with guaze packing present at the open distal portion of the incision, no surrounding erythema, no warmth or acute signs of infection, localized edema improving, Capillary fill time present to all remaining digits, gross sensation present via light touch. No calf pain. Range of motion excluding surgical site within normal limits with no pain or crepitation.     Results for orders placed or performed during the hospital encounter of 03/04/15 (from the past 24 hour(s))  Glucose, capillary     Status: Abnormal   Collection Time: 03/07/15 11:33 AM  Result Value Ref Range   Glucose-Capillary 138 (H) 65 - 99 mg/dL  Glucose, capillary     Status: Abnormal   Collection Time: 03/07/15  4:00 PM  Result Value Ref Range   Glucose-Capillary 102 (H) 65 - 99 mg/dL  Glucose, capillary     Status: Abnormal   Collection Time: 03/07/15  9:00 PM  Result Value Ref Range   Glucose-Capillary 64 (L) 65 - 99 mg/dL  Glucose, capillary     Status: Abnormal   Collection Time: 03/07/15 10:05 PM  Result Value Ref Range   Glucose-Capillary 123 (H) 65 - 99 mg/dL  CBC     Status: Abnormal   Collection Time: 03/08/15  5:39 AM  Result Value Ref Range   WBC 11.0 (H) 4.0 - 10.5 K/uL   RBC 4.61 4.22 - 5.81 MIL/uL   Hemoglobin 13.3 13.0 - 17.0 g/dL   HCT 39.9 39.0 - 52.0 %   MCV 86.6 78.0 - 100.0 fL   MCH 28.9 26.0 - 34.0 pg   MCHC 33.3 30.0 - 36.0 g/dL   RDW 13.3 11.5 - 15.5 %   Platelets 221 150 - 400 K/uL  Basic metabolic panel     Status: Abnormal   Collection Time: 03/08/15  5:39 AM  Result Value Ref Range   Sodium 137 135 - 145 mmol/L   Potassium 4.5 3.5 - 5.1 mmol/L   Chloride 98 (L) 101 - 111 mmol/L   CO2 29 22 - 32 mmol/L   Glucose, Bld 113 (H) 65 - 99 mg/dL   BUN 19 6 - 20 mg/dL   Creatinine, Ser 0.86 0.61 - 1.24 mg/dL    Calcium 8.6 (L) 8.9 - 10.3 mg/dL   GFR calc non Af Amer >60 >60 mL/min   GFR calc Af Amer >60 >60 mL/min   Anion gap 10 5 - 15  Glucose, capillary     Status: Abnormal   Collection Time: 03/08/15  5:58 AM  Result Value Ref Range   Glucose-Capillary 124 (H) 65 - 99 mg/dL   Results for orders placed or performed during the hospital encounter of 03/04/15  Anaerobic culture  Status: None (Preliminary result)   Collection Time: 03/04/15  7:58 PM  Result Value Ref Range Status   Specimen Description TISSUE RIGHT FOOT  Final   Special Requests 5TH METATARSAL PT ON VANC  Final   Gram Stain   Final    NO WBC SEEN MODERATE GRAM NEGATIVE RODS Performed at [facility]    Culture   Final    NO ANAEROBES ISOLATED; CULTURE IN PROGRESS FOR 5 DAYS Performed at [facility]    Report Status PENDING  Incomplete  Tissue culture     Status: None (Preliminary result)   Collection Time: 03/04/15  7:58 PM  Result Value Ref Range Status   Specimen Description TISSUE RIGHT FOOT  Final   Special Requests 5TH METATARSAL PT ON VANC  Final   Gram Stain   Final    NO WBC SEEN MODERATE GRAM NEGATIVE RODS Performed at [facility]   Final    Culture reincubated for better growth Performed at [facility]    Report Status PENDING  Incomplete  Anaerobic culture     Status: None (Preliminary result)   Collection Time: 03/04/15  8:05 PM  Result Value Ref Range Status   Specimen Description TISSUE BONE RIGHT FOOT  Final   Special Requests 5TH METATARSAL PT ON VANC  Final   Gram Stain   Final    RARE WBC PRESENT, PREDOMINANTLY MONONUCLEAR NO ORGANISMS SEEN Performed at [facility]    Culture   Final    NO ANAEROBES ISOLATED; CULTURE IN PROGRESS FOR 5 DAYS Performed at [facility]    Report Status PENDING  Incomplete  Tissue culture     Status: None (Preliminary result)   Collection Time: 03/04/15  8:05 PM  Result Value Ref Range  Status   Specimen Description TISSUE BONE RIGHT FOOT  Final   Special Requests 5TH METETARSAL PT ON VANC  Final   Gram Stain   Final    RARE WBC PRESENT, PREDOMINANTLY MONONUCLEAR NO ORGANISMS SEEN Performed at [facility]    Culture PENDING  Incomplete   Report Status PENDING  Incomplete  Anaerobic culture     Status: None (Preliminary result)   Collection Time: 03/04/15  8:10 PM  Result Value Ref Range Status   Specimen Description WOUND RIGHT FOOT  Final   Special Requests SWAB OF 5TH METATARSAL PT ON VANC  Final   Gram Stain   Final    ABUNDANT WBC PRESENT,BOTH PMN AND MONONUCLEAR NO SQUAMOUS EPITHELIAL CELLS SEEN NO ORGANISMS SEEN Performed at [facility]    Culture   Final    NO ANAEROBES ISOLATED; CULTURE IN PROGRESS FOR 5 DAYS Performed at [facility]    Report Status PENDING  Incomplete  Wound culture     Status: None   Collection Time: 03/04/15  8:10 PM  Result Value Ref Range Status   Specimen Description WOUND RIGHT FOOT  Final   Special Requests SWAB OF 5TH METATARSAL PT ON VANC  Final   Gram Stain   Final    ABUNDANT WBC PRESENT,BOTH PMN AND MONONUCLEAR NO SQUAMOUS EPITHELIAL CELLS SEEN NO ORGANISMS SEEN Performed at [facility]    Culture   Final    NO GROWTH 2 DAYS Performed at [facility]    Report Status 03/07/2015 FINAL  Final     Assessment and Plan:  Problem List Items Addressed This Visit  Endocrine   Type II diabetes mellitus (HCC)   Relevant Medications   aspirin EC tablet 81 mg   insulin aspart (novoLOG) injection 0-15 Units   insulin aspart (novoLOG) injection 0-5 Units   irbesartan (AVAPRO) tablet 300 mg   glimepiride (AMARYL) tablet 2 mg   canagliflozin (INVOKANA) tablet 300 mg   metFORMIN (GLUCOPHAGE) tablet 1,000 mg   Other Relevant Orders   Diet Carb Modified   * (Principal)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HCC) - Primary   Relevant Medications   aspirin EC tablet 81 mg    insulin aspart (novoLOG) injection 0-15 Units   insulin aspart (novoLOG) injection 0-5 Units   irbesartan (AVAPRO) tablet 300 mg   glimepiride (AMARYL) tablet 2 mg   canagliflozin (INVOKANA) tablet 300 mg   metFORMIN (GLUCOPHAGE) tablet 1,000 mg   ceFEPIme (MAXIPIME) 2 g in dextrose 5 % 50 mL IVPB   vancomycin (VANCOCIN) 1,250 mg in sodium chloride 0.9 % 250 mL IVPB   vancomycin 1,250 mg in sodium chloride 0.9 % 250 mL   Other Relevant Orders   Home Health   Face-to-face encounter (required for Medicare/Medicaid patients)    Other Visit Diagnoses    S/P amputation of lesser toe, right (HCC)        Relevant Orders    DG Foot Complete Right (Completed)    Fever        Relevant Orders    DG Chest 2 View (Completed)      -Patient seen and evaluated at bedside, all questions answered with wife present during encounter  -Dressing change performed; Applied dry sterile dressing secured with ACE wrap -Advised patient to make sure to keep dressing clean, dry, and intact to Right foot -Advised patient to limit weightbearing to necessity and to wear forefoot offloading shoe on right foot at all times when attempting ambulation -Continue with IV antibiotics; Awaiting final micro which will be followed up on outpatient bases; anticipate IV antibiotics for an additional 2 weeks since patient has already had 4 weeks of antibiotics via PICC line that was placed on 02-05-15; the final decision for this antibiotic timeline will be determined based on final culture and sensitivities and with the assistance of Dr. [name], [place] will continue to follow closely  -Anticipated discharge plan of care: Continue local wound care with first visit in office on Tues 03-16-15 in [place] office after discharge; Patient instructed to call office for an appointment. Will plan to continue HBO treatments on an outpatient bases with Dr. Con Memos. Continue close ID follow up with Dr. [name]. Case was  discussed in great detail with Dr. Con Memos and Dr. Rockne Menghini, hospitalist.   Landis [place], DPM

## 2015-03-08 NOTE — Care Management Note (Addendum)
Case Management Note  Patient Details  Name: OLVIN LEVECK MRN: [id number] Date of Birth: [birth year]  Subjective/Objective:   50 yr old male s/p I & D of right foot with partial right fifth toe amputation.                 Action/Plan: Case manager spoke with patient and wife at the bedside concerning Home health needs at discharge. Choice was offered, patient states he has used [facility] , is still active with [facility].  Case manager called referral to [facility], [place] Specialist and to [place], [place] [facility] Specialist. Patient will be going home on IV Vancomycin and Ceftrioxone. PICC line is already in place. Patient has family support at discharge.   Expected Discharge Date:    03/08/15              Expected Discharge Plan:  [place]  In-House Referral:  NA  Discharge planning Services  CM Consult  Post Acute Care Choice:  Home Health Choice offered to:  Patient  DME Arranged:  N/A DME Agency:  NA  HH Arranged:  RN, IV Antibiotics HH Agency:  [place]  Status of Service:  In process, will continue to follow  Medicare Important Message Given:    Date Medicare IM Given:    Medicare IM give by:    Date Additional Medicare IM Given:    Additional Medicare Important Message give by:     If discussed at [place] of Stay Meetings, dates discussed:    Additional Comments:  Ninfa Meeker, RN 03/08/2015, 10:52 AM

## 2015-03-08 NOTE — Progress Notes (Signed)
Progress Note   [name] [id number] DOB: 1965-07-16 DOA: 03/04/2015 PCP: Tivis Ringer, MD   Brief Narrative:   [name] is an 50 y.o. male with a PMH of insulin-dependent type 2 diabetes, chronic nonhealing diabetic foot wound who was admitted 03/04/15 for surgical debridement and amputation of his fifth metatarsal and toe. Cultures done prior to surgical debridement 4 weeks prior showed MSSA and Serratia.  Assessment/Plan:   Principal Problem:   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[place]), Status post right foot I&D with fifth metatarsal partial amputation and removal of toe/infected bone and soft tissue with culture on 03/04/15 - Operative culture still pending but prior cultures from wound 4 weeks prior to admission grew MSSA and Serratia. - 2 weeks of antibiotics recommended, narrow based on surgical cultures. Currently on cefepime and vancomycin. - Follow-up with Dr. Cannon Kettle post discharge.  Active Problems:   Fever with nausea/vomiting - Chest x-ray done 03/07/15 showed possible right perihilar infiltrate. Check influenza PCR. - Continue IV fluid/symptomatic treatment for now. - We'll get one set of peripheral and 1 set of blood cultures from PICC line.    Type II diabetes mellitus (HCC) - Hemoglobin A1c 7.6%. - Currently being managed with Invokana, Amaryl, metformin and moderate scale SSI. CBGs [id number].    Hyperlipidemia    Hypertension - Continue Avapro, Norvasc and HCTZ.    DVT Prophylaxis - Heparin ordered.   Family Communication/Anticipated D/C date and plan/Code Status   Family Communication: Wife at the bedside. Disposition Plan/date: Home when fever curve improved, possibly 03/09/15. Code Status: Full code.   IV Access:    PICC line   Procedures and diagnostic studies:   Dg Foot Complete Right  03/04/2015  CLINICAL DATA:  Status post fifth metatarsal amputation EXAM: RIGHT FOOT COMPLETE - 3+ VIEW COMPARISON:  03/04/2015  FINDINGS: There is been interval resection of the majority of the fifth metatarsal as well as the fifth toe. Some air is noted in the surgical bed. No other focal area of bony destruction is noted to suggest osteomyelitis. IMPRESSION: Status post fifth metatarsal and fifth toe amputation. Electronically Signed   By: Inez Catalina M.D.   On: 03/04/2015 21:33   Dg Foot Complete Right  03/04/2015  3 views of a skeletally mature individual were obtained of the right foot. Study includes AP, oblique, lateral projections. There is destructive changes to the head of the 5th metatarsal and to the 5th toe consistent with osteomyelitis. No soft tissue emphysema. There is swelling around the 5th MTPJ. No evidence of acute fracture. Vessel calcification is present.     Medical Consultants:    Podiatry  Anti-Infectives:    Anti-infectives    Start     Dose/Rate Route Frequency Ordered Stop   03/07/15 1300  vancomycin (VANCOCIN) 1,250 mg in sodium chloride 0.9 % 250 mL IVPB     1,250 mg 166.7 mL/hr over 90 Minutes Intravenous Every 12 hours 03/07/15 1233     03/05/15 1900  cefTRIAXone (ROCEPHIN) 2 g in dextrose 5 % 50 mL IVPB  Status:  Discontinued     2 g 100 mL/hr over 30 Minutes Intravenous Every 24 hours 03/04/15 2133 03/05/15 1437   03/05/15 1845  levofloxacin (LEVAQUIN) tablet 750 mg  Status:  Discontinued     750 mg Oral Every 24 hours 03/04/15 2131 03/05/15 1437   03/05/15 1600  ceFEPIme (MAXIPIME) 2 g in dextrose 5 % 50 mL IVPB     2 g  100 mL/hr over 30 Minutes Intravenous 3 times per day 03/05/15 1506     03/04/15 2024  polymyxin B 500,000 Units, bacitracin 50,000 Units in sodium chloride irrigation 0.9 % 500 mL irrigation  Status:  Discontinued       As needed 03/04/15 2024 03/04/15 2032   03/04/15 1815  levofloxacin (LEVAQUIN) tablet 750 mg  Status:  Discontinued     750 mg Oral Daily 03/04/15 1803 03/04/15 2130   03/04/15 1815  cefTRIAXone (ROCEPHIN) 1 g in dextrose 5 % 50 mL IVPB   Status:  Discontinued     1 g 100 mL/hr over 30 Minutes Intravenous Every 24 hours 03/04/15 1803 03/04/15 2132      Subjective:    [name] feels nauseated and had an episode of vomiting this morning. Feels weak. Has a dry cough. Has been febrile. Does not feel well enough to return home today.  Objective:    Filed Vitals:   03/07/15 1335 03/07/15 1622 03/07/15 2057 03/08/15 0555  BP: 137/58  140/59 129/66  Pulse: 99  98 73  Temp: 99.5 F (37.5 C) 98.1 F (36.7 C) 99.3 F (37.4 C) 99.3 F (37.4 C)  TempSrc: Oral  Oral Oral  Resp: 18  18 18   Height:      Weight:      SpO2: 96%  94% 95%    Intake/Output Summary (Last 24 hours) at 03/08/15 0854 Last data filed at 03/07/15 2215  Gross per 24 hour  Intake    240 ml  Output    400 ml  Net   -160 ml   Filed Weights   03/04/15 1359  Weight: 129.275 kg (285 lb)    Exam: Gen:  Mildly toxic appearing Cardiovascular:  RRR, No M/R/G Respiratory:  Lungs CTAB Gastrointestinal:  Abdomen soft, NT/ND, + BS Extremities:  Left toe amputation site healing well with pink wound bed   Data Reviewed:    Labs: Basic Metabolic Panel:  Recent Labs Lab 03/04/15 1400 03/06/15 0452 03/07/15 0600 03/08/15 0539  NA 137 138 135 137  K 4.8 4.5 4.6 4.5  CL 99* 103 100* 98*  CO2 23 25 24 29   GLUCOSE 194* 140* 112* 113*  BUN 27* 26* 20 19  CREATININE 1.12 1.01 0.87 0.86  CALCIUM 9.3 8.9 8.9 8.6*   GFR Estimated Creatinine Clearance: 148.4 mL/min (by C-G formula based on Cr of 0.86). Liver Function Tests:  Recent Labs Lab 03/04/15 1400  AST 24  ALT 18  ALKPHOS 63  BILITOT 0.6  PROT 7.4  ALBUMIN 3.3*   Coagulation profile  Recent Labs Lab 03/04/15 2121  INR 1.09    CBC:  Recent Labs Lab 03/04/15 1400 03/06/15 0452 03/07/15 0600 03/08/15 0539  WBC 13.2* 10.5 12.2* 11.0*  HGB 15.0 13.4 13.4 13.3  HCT 43.8 39.9 40.3 39.9  MCV 86.2 87.5 86.1 86.6  PLT 267 227 226 221   CBG:  Recent Labs Lab  03/07/15 1133 03/07/15 1600 03/07/15 2100 03/07/15 2205 03/08/15 0558  GLUCAP 138* 102* 64* 123* 124*   Sepsis Labs:  Recent Labs Lab 03/04/15 1400 03/04/15 2120 03/06/15 0452 03/07/15 0600 03/08/15 0539  WBC 13.2*  --  10.5 12.2* 11.0*  LATICACIDVEN  --  1.3  --   --   --    Microbiology Recent Results (from the past 240 hour(s))  Anaerobic culture     Status: None (Preliminary result)   Collection Time: 03/04/15  7:58 PM  Result Value Ref  Range Status   Specimen Description TISSUE RIGHT FOOT  Final   Special Requests 5TH METATARSAL PT ON VANC  Final   Gram Stain   Final    NO WBC SEEN MODERATE GRAM NEGATIVE RODS Performed at [facility]    Culture   Final    NO ANAEROBES ISOLATED; CULTURE IN PROGRESS FOR 5 DAYS Performed at [facility]    Report Status PENDING  Incomplete  Tissue culture     Status: None (Preliminary result)   Collection Time: 03/04/15  7:58 PM  Result Value Ref Range Status   Specimen Description TISSUE RIGHT FOOT  Final   Special Requests 5TH METATARSAL PT ON VANC  Final   Gram Stain   Final    NO WBC SEEN MODERATE GRAM NEGATIVE RODS Performed at [facility]   Final    Culture reincubated for better growth Performed at [facility]    Report Status PENDING  Incomplete  Anaerobic culture     Status: None (Preliminary result)   Collection Time: 03/04/15  8:05 PM  Result Value Ref Range Status   Specimen Description TISSUE BONE RIGHT FOOT  Final   Special Requests 5TH METATARSAL PT ON VANC  Final   Gram Stain   Final    RARE WBC PRESENT, PREDOMINANTLY MONONUCLEAR NO ORGANISMS SEEN Performed at [facility]    Culture   Final    NO ANAEROBES ISOLATED; CULTURE IN PROGRESS FOR 5 DAYS Performed at [facility]    Report Status PENDING  Incomplete  Tissue culture     Status: None (Preliminary result)   Collection Time: 03/04/15  8:05 PM  Result Value Ref Range Status    Specimen Description TISSUE BONE RIGHT FOOT  Final   Special Requests 5TH METETARSAL PT ON VANC  Final   Gram Stain   Final    RARE WBC PRESENT, PREDOMINANTLY MONONUCLEAR NO ORGANISMS SEEN Performed at [facility]    Culture PENDING  Incomplete   Report Status PENDING  Incomplete  Anaerobic culture     Status: None (Preliminary result)   Collection Time: 03/04/15  8:10 PM  Result Value Ref Range Status   Specimen Description WOUND RIGHT FOOT  Final   Special Requests SWAB OF 5TH METATARSAL PT ON VANC  Final   Gram Stain   Final    ABUNDANT WBC PRESENT,BOTH PMN AND MONONUCLEAR NO SQUAMOUS EPITHELIAL CELLS SEEN NO ORGANISMS SEEN Performed at [facility]    Culture   Final    NO ANAEROBES ISOLATED; CULTURE IN PROGRESS FOR 5 DAYS Performed at [facility]    Report Status PENDING  Incomplete  Wound culture     Status: None   Collection Time: 03/04/15  8:10 PM  Result Value Ref Range Status   Specimen Description WOUND RIGHT FOOT  Final   Special Requests SWAB OF 5TH METATARSAL PT ON VANC  Final   Gram Stain   Final    ABUNDANT WBC PRESENT,BOTH PMN AND MONONUCLEAR NO SQUAMOUS EPITHELIAL CELLS SEEN NO ORGANISMS SEEN Performed at [facility]    Culture   Final    NO GROWTH 2 DAYS Performed at [facility]    Report Status 03/07/2015 FINAL  Final     Medications:   . irbesartan  300 mg Oral Daily   And  . amLODipine  10 mg Oral Daily   And  . hydrochlorothiazide  25 mg  Oral Daily  . aspirin EC  81 mg Oral Daily  . canagliflozin  300 mg Oral Daily  . ceFEPime (MAXIPIME) IV  2 g Intravenous 3 times per day  . glimepiride  2 mg Oral BID WC  . heparin subcutaneous  5,000 Units Subcutaneous 3 times per day  . insulin aspart  0-15 Units Subcutaneous TID WC  . insulin aspart  0-5 Units Subcutaneous QHS  . metFORMIN  1,000 mg Oral BID WC  . methocarbamol  500 mg Oral BID  . omega-3 acid ethyl esters  2 g Oral Daily  . vancomycin   1,250 mg Intravenous Q12H   Continuous Infusions:   Time spent: 35 minutes.  The patient is medically complex with multiple co-morbidities and is at high risk for clinical deterioration and requires high complexity decision making.    LOS: 4 days   [facility] Pager [phone]. If unable to reach me by pager, please call my cell phone at [phone].  *Please refer to [email or web address], password [email or web address] to get updated schedule on who will round on this patient, as hospitalists switch teams weekly. If 7PM-7AM, please contact night-coverage at [email or web address], password [email or web address] for any overnight needs.  03/08/2015, 8:54 AM

## 2015-03-08 NOTE — Anesthesia Postprocedure Evaluation (Signed)
Anesthesia Post Note  Patient: [name]  Procedure(s) Performed: Procedure(s) (LRB): IRRIGATION AND DEBRIDEMENT RIGHT FOOT (Right) PARTIAL AMPUTATION RIGHT 5TH METATARSAL (Right)  Patient location during evaluation: PACU Anesthesia Type: General Level of consciousness: awake and alert Pain management: pain level controlled Vital Signs Assessment: post-procedure vital signs reviewed and stable Respiratory status: spontaneous breathing, nonlabored ventilation, respiratory function stable and patient connected to nasal cannula oxygen Cardiovascular status: blood pressure returned to baseline and stable Postop Assessment: no signs of nausea or vomiting Anesthetic complications: no                  [name]

## 2015-03-08 NOTE — Progress Notes (Signed)
AVID, SOLLIE ([id number]) Visit Report for 02/19/2015 Arrival Information Details Patient Name: [name], [name]. Date of Service: 02/19/2015 3:30 PM Medical Record Number: [id number] Patient Account Number: [id number] Date of Birth/Sex: January 05, 1965 (50 y.o. Male) Treating RN: Montey Hora Primary Care Physician: [name] Other Clinician: Referring Physician: [name] Treating Physician/Extender: Benjaman Pott in Treatment: 6 Visit Information History Since Last Visit Added or deleted any medications: No Patient Arrived: Ambulatory Any new allergies or adverse reactions: No Arrival Time: 15:18 Had a fall or experienced change in No Accompanied By: self activities of daily living that may affect Transfer Assistance: None risk of falls: Patient Identification Verified: Yes Signs or symptoms of abuse/neglect since last No Secondary Verification Process Yes visito Completed: Hospitalized since last visit: No Patient Requires Transmission-Based No Pain Present Now: No Precautions: Patient Has Alerts: Yes Electronic Signature(s) Signed: 02/19/2015 4:52:57 PM By: Montey Hora Entered By: Montey Hora on 02/19/2015 15:19:04 [name] ([id number]) -------------------------------------------------------------------------------- Encounter Discharge Information Details Patient Name: [name] Date of Service: 02/19/2015 3:30 PM Medical Record Number: [id number] Patient Account Number: [id number] Date of Birth/Sex: 1965/03/12 (50 y.o. Male) Treating RN: Montey Hora Primary Care Physician: [name] Other Clinician: Referring Physician: [name] Treating Physician/Extender: Benjaman Pott in Treatment: 6 Encounter Discharge Information Items Discharge Pain Level: 0 Discharge Condition: Stable Ambulatory Status: Ambulatory Discharge Destination: Home Transportation: Private Auto Accompanied By:  self Schedule Follow-up Appointment: Yes Medication Reconciliation completed No and provided to Patient/Care Caulder Wehner: Patient Clinical Summary of Care: Declined Electronic Signature(s) Signed: 03/08/2015 10:13:55 AM By: Judene Companion MD Previous Signature: 02/19/2015 4:28:31 PM Version By: Montey Hora Previous Signature: 02/19/2015 4:01:35 PM Version By: Ruthine Dose Entered By: Judene Companion on 02/19/2015 16:31:31 [name] ([id number]) -------------------------------------------------------------------------------- Lower Extremity Assessment Details Patient Name: [name] Date of Service: 02/19/2015 3:30 PM Medical Record Number: [id number] Patient Account Number: [id number] Date of Birth/Sex: 1965-03-08 (50 y.o. Male) Treating RN: Montey Hora Primary Care Physician: [name] Other Clinician: Referring Physician: [name] Treating Physician/Extender: Benjaman Pott in Treatment: 6 Edema Assessment Assessed: [Left: No] [Right: No] Edema: [Left: Ye] [Right: s] Vascular Assessment Pulses: Posterior Tibial Dorsalis Pedis Palpable: [Right:Yes] Extremity colors, hair growth, and conditions: Extremity Color: [Right:Normal] Hair Growth on Extremity: [Right:Yes] Temperature of Extremity: [Right:Warm] Capillary Refill: [Right:< 3 seconds] Electronic Signature(s) Signed: 02/19/2015 4:52:57 PM By: Montey Hora Entered By: Montey Hora on 02/19/2015 15:30:32 [name] ([id number]) -------------------------------------------------------------------------------- Multi Wound Chart Details Patient Name: [name] Date of Service: 02/19/2015 3:30 PM Medical Record Number: [id number] Patient Account Number: [id number] Date of Birth/Sex: 11/06/1965 (50 y.o. Male) Treating RN: Montey Hora Primary Care Physician: [name] Other Clinician: Referring Physician: [name] Treating Physician/Extender:  Benjaman Pott in Treatment: 6 Vital Signs Height(in): 74 Capillary Blood 133 Glucose(mg/dl): Weight(lbs): 288 Pulse(bpm): 108 Body Mass Index(BMI): 37 Blood Pressure Temperature(F): 98.1 122/74 (mmHg): Respiratory Rate 18 (breaths/min): Photos: [1:No Photos] [2:No Photos] [N/A:N/A] Wound Location: [1:Right Foot - Plantar] [2:Right Metatarsal head fifth N/A - Lateral] Wounding Event: [1:Gradually Appeared] [2:Bump] [N/A:N/A] Primary Etiology: [1:Diabetic Wound/Ulcer of the Lower Extremity] [2:Diabetic Wound/Ulcer of N/A the Lower Extremity] Comorbid History: [1:Hypertension, Type II Diabetes, Neuropathy] [2:Hypertension, Type II Diabetes, Neuropathy] [N/A:N/A] Date Acquired: [1:12/30/2014] [2:02/19/2015] [N/A:N/A] Weeks of Treatment: [1:6] [2:0] [N/A:N/A] Wound Status: [1:Open] [2:Open] [N/A:N/A] Measurements L x W x D 0.9x0.9x0.1 [2:N/A] [N/A:N/A] (cm) Area (cm) : [1:0.636] [2:N/A] [N/A:N/A] Volume (cm) : [1:0.064] [2:N/A] [N/A:N/A] % Reduction in Area: [1:19.00%] [2:N/A] [N/A:N/A] % Reduction  in Volume: 72.90% [2:N/A] [N/A:N/A] Classification: [1:Grade 3] [2:Grade 1] [N/A:N/A] Wagner Verification: [1:Abscess] [2:N/A] [N/A:N/A] Exudate Amount: [1:Large] [2:Medium] [N/A:N/A] Exudate Type: [1:Serosanguineous] [2:Serosanguineous] [N/A:N/A] Exudate Color: [1:red, brown] [2:red, brown] [N/A:N/A] Wound Margin: [1:Distinct, outline attached] [2:Flat and Intact] [N/A:N/A] Granulation Amount: [1:Large (67-100%)] [2:None Present (0%)] [N/A:N/A] Granulation Quality: [1:Red, Pink] [2:N/A] [N/A:N/A] Necrotic Amount: [1:Small (1-33%)] [2:None Present (0%)] [N/A:N/A] Exposed Structures: [1:Fascia: No Fat: No Tendon: No Muscle: No] [2:Fascia: No Fat: No Tendon: No Muscle: No] [N/A:N/A] Joint: No Joint: No Bone: No Bone: No Limited to Skin Limited to Skin Breakdown Breakdown Epithelialization: None None N/A Periwound Skin Texture: Callus: Yes Edema: Yes N/A Edema:  No Excoriation: No Excoriation: No Induration: No Induration: No Callus: No Crepitus: No Crepitus: No Fluctuance: No Fluctuance: No Friable: No Friable: No Rash: No Rash: No Scarring: No Scarring: No Periwound Skin Moist: Yes Maceration: No N/A Moisture: Maceration: No Moist: No Dry/Scaly: No Dry/Scaly: No Periwound Skin Color: Atrophie Blanche: No Erythema: Yes N/A Cyanosis: No Atrophie Blanche: No Ecchymosis: No Cyanosis: No Erythema: No Ecchymosis: No Hemosiderin Staining: No Hemosiderin Staining: No Mottled: No Mottled: No Pallor: No Pallor: No Rubor: No Rubor: No Erythema Location: N/A Circumferential N/A Temperature: No Abnormality Hot N/A Tenderness on No Yes N/A Palpation: Wound Preparation: Ulcer Cleansing: Ulcer Cleansing: N/A Rinsed/Irrigated with Rinsed/Irrigated with Saline Saline Topical Anesthetic Topical Anesthetic Applied: Other: Lidocaine Applied: Other: lidocaine 4% 4% Treatment Notes Electronic Signature(s) Signed: 02/19/2015 4:52:57 PM By: Montey Hora Entered By: Montey Hora on 02/19/2015 15:50:23 [name] ([id number]) -------------------------------------------------------------------------------- [place] Details Patient Name: [name], [name]. Date of Service: 02/19/2015 3:30 PM Medical Record Number: [id number] Patient Account Number: [id number] Date of Birth/Sex: [birth year] (50 y.o. Male) Treating RN: Montey Hora Primary Care Physician: [name] Other Clinician: Referring Physician: [name] Treating Physician/Extender: Benjaman Pott in Treatment: 6 Active Inactive HBO Nursing Diagnoses: Anxiety related to feelings of confinement associated with the hyperbaric oxygen chamber Anxiety related to knowledge deficit of hyperbaric oxygen therapy and treatment procedures Discomfort related to temperature and humidity changes inside hyperbaric chamber Potential for  barotraumas to ears, sinuses, teeth, and lungs or cerebral gas embolism related to changes in atmospheric pressure inside hyperbaric oxygen chamber Potential for oxygen toxicity seizures related to delivery of 100% oxygen at an increased atmospheric pressure Potential for pulmonary oxygen toxicity related to delivery of 100% oxygen at an increased atmospheric pressure Goals: Barotrauma will be prevented during HBO2 Date Initiated: 01/07/2015 Goal Status: Active Patient and/or family will be able to state/discuss factors appropriate to the management of their disease process during treatment Date Initiated: 01/07/2015 Goal Status: Active Patient will tolerate the hyperbaric oxygen therapy treatment Date Initiated: 01/07/2015 Goal Status: Active Patient will tolerate the internal climate of the chamber Date Initiated: 01/07/2015 Goal Status: Active Patient/caregiver will verbalize understanding of HBO goals, rationale, procedures and potential hazards Date Initiated: 01/07/2015 Goal Status: Active Signs and symptoms of pulmonary oxygen toxicity will be recognized and promptly addressed Date Initiated: 01/07/2015 Goal Status: Active Signs and symptoms of seizure will be recognized and promptly addressed ; seizing patients will suffer no harm Date Initiated: 01/07/2015 [name] ([id number]) Goal Status: Active Interventions: Administer a five (5) minute air break for patient if signs and symptoms of seizure appear and notify the hyperbaric physician Administer a ten (10) minute air break for patient if signs and symptoms of seizure appear and notify the hyperbaric physician Administer decongestants, per physician orders, prior to HBO2 Administer the correct therapeutic gas  delivery based on the patients needs and limitations, per physician order Assess and provide for patientos comfort related to the hyperbaric environment and equalization of middle ear Assess for signs and  symptoms related to adverse events, including but not limited to confinement anxiety, pneumothorax, oxygen toxicity and baurotrauma Assess patient for any history of confinement anxiety Assess patient's knowledge and expectations regarding hyperbaric medicine and provide education related to the hyperbaric environment, goals of treatment and prevention of adverse events Implement protocols to decrease risk of pneumothorax in high risk patients Notes: Orientation to the Wound Care Program Nursing Diagnoses: Knowledge deficit related to the wound healing center program Goals: Patient/caregiver will verbalize understanding of the [facility] Date Initiated: 01/07/2015 Goal Status: Active Interventions: Provide education on orientation to the wound center Notes: Peripheral Neuropathy Nursing Diagnoses: Knowledge deficit related to disease process and management of peripheral neurovascular dysfunction Potential alteration in peripheral tissue perfusion (select prior to confirmation of diagnosis) Goals: Patient/caregiver will verbalize understanding of disease process and disease management Date Initiated: 01/07/2015 Goal Status: Active [name], [name] ([id number]) Interventions: Assess signs and symptoms of neuropathy upon admission and as needed Provide education on Management of Neuropathy and Related Ulcers Provide education on Management of Neuropathy upon discharge from the [facility] for HBO Treatment Activities: Consult for HBO : 02/19/2015 Patient referred for customized footwear/offloading : 02/19/2015 Notes: Wound/Skin Impairment Nursing Diagnoses: Impaired tissue integrity Knowledge deficit related to ulceration/compromised skin integrity Goals: Patient/caregiver will verbalize understanding of skin care regimen Date Initiated: 01/07/2015 Goal Status: Active Ulcer/skin breakdown will have a volume reduction of 30% by week 4 Date Initiated:  01/07/2015 Goal Status: Active Ulcer/skin breakdown will have a volume reduction of 50% by week 8 Date Initiated: 01/07/2015 Goal Status: Active Ulcer/skin breakdown will have a volume reduction of 80% by week 12 Date Initiated: 01/07/2015 Goal Status: Active Ulcer/skin breakdown will heal within 14 weeks Date Initiated: 01/07/2015 Goal Status: Active Interventions: Assess patient/caregiver ability to obtain necessary supplies Assess patient/caregiver ability to perform ulcer/skin care regimen upon admission and as needed Assess ulceration(s) every visit Provide education on ulcer and skin care Treatment Activities: Referred to DME Charley Miske for dressing supplies : 02/19/2015 Skin care regimen initiated : 02/19/2015 [name], [name] ([id number]) Topical wound management initiated : 02/19/2015 Notes: Electronic Signature(s) Signed: 02/19/2015 4:52:57 PM By: Montey Hora Entered By: Montey Hora on 02/19/2015 15:50:16 [name] ([id number]) -------------------------------------------------------------------------------- Patient/Caregiver Education Details Patient Name: [name] Date of Service: 02/19/2015 3:30 PM Medical Record Number: [id number] Patient Account Number: [id number] Date of Birth/Gender: May 28, 1965 (50 y.o. Male) Treating RN: Montey Hora Primary Care Physician: [name] Other Clinician: Referring Physician: [name] Treating Physician/Extender: Benjaman Pott in Treatment: 6 Education Assessment Education Provided To: Patient Education Topics Provided Wound/Skin Impairment: Handouts: Other: wound care as ordered Methods: Demonstration, Explain/Verbal Responses: State content correctly Electronic Signature(s) Signed: 03/08/2015 10:13:55 AM By: Judene Companion MD Previous Signature: 02/19/2015 4:28:49 PM Version By: Montey Hora Entered By: Judene Companion on 02/19/2015 16:31:43 [name]  ([id number]) -------------------------------------------------------------------------------- Wound Assessment Details Patient Name: [name] Date of Service: 02/19/2015 3:30 PM Medical Record Number: [id number] Patient Account Number: [id number] Date of Birth/Sex: Aug 30, 1965 (50 y.o. Male) Treating RN: Montey Hora Primary Care Physician: [name] Other Clinician: Referring Physician: [name] Treating Physician/Extender: Benjaman Pott in Treatment: 6 Wound Status Wound Number: 1 Primary Diabetic Wound/Ulcer of the Lower Etiology: Extremity Wound Location: Right Foot - Plantar Wound Status: Open Wounding Event: Gradually  Appeared Comorbid Hypertension, Type II Diabetes, Date Acquired: 12/30/2014 History: Neuropathy Weeks Of Treatment: 6 Clustered Wound: No Photos Photo Uploaded By: Montey Hora on 02/19/2015 16:37:47 Wound Measurements Length: (cm) 0.9 Width: (cm) 0.9 Depth: (cm) 0.1 Area: (cm) 0.636 Volume: (cm) 0.064 % Reduction in Area: 19% % Reduction in Volume: 72.9% Epithelialization: None Tunneling: No Undermining: No Wound Description Classification: Grade 3 Wagner Verification: Abscess Wound Margin: Distinct, outline attached Exudate Amount: Large Exudate Type: Serosanguineous Exudate Color: red, brown Foul Odor After Cleansing: No Wound Bed Granulation Amount: Large (67-100%) Exposed Structure Granulation Quality: Red, Pink Fascia Exposed: No Necrotic Amount: Small (1-33%) Fat Layer Exposed: No [name], [name] ([id number]) Necrotic Quality: Adherent Slough Tendon Exposed: No Muscle Exposed: No Joint Exposed: No Bone Exposed: No Limited to Skin Breakdown Periwound Skin Texture Texture Color No Abnormalities Noted: No No Abnormalities Noted: No Callus: Yes Atrophie Blanche: No Crepitus: No Cyanosis: No Excoriation: No Ecchymosis: No Fluctuance: No Erythema: No Friable: No Hemosiderin Staining:  No Induration: No Mottled: No Localized Edema: No Pallor: No Rash: No Rubor: No Scarring: No Temperature / Pain Moisture Temperature: No Abnormality No Abnormalities Noted: No Dry / Scaly: No Maceration: No Moist: Yes Wound Preparation Ulcer Cleansing: Rinsed/Irrigated with Saline Topical Anesthetic Applied: Other: Lidocaine 4%, Electronic Signature(s) Signed: 02/19/2015 4:52:57 PM By: Montey Hora Entered By: Montey Hora on 02/19/2015 15:27:07 [name] ([id number]) -------------------------------------------------------------------------------- Wound Assessment Details Patient Name: [name] Date of Service: 02/19/2015 3:30 PM Medical Record Number: [id number] Patient Account Number: [id number] Date of Birth/Sex: 07/31/1965 (50 y.o. Male) Treating RN: Montey Hora Primary Care Physician: [name] Other Clinician: Referring Physician: [name] Treating Physician/Extender: Benjaman Pott in Treatment: 6 Wound Status Wound Number: 2 Primary Diabetic Wound/Ulcer of the Lower Etiology: Extremity Wound Location: Right Metatarsal head fifth - Lateral Wound Status: Open Wounding Event: Bump Comorbid Hypertension, Type II Diabetes, History: Neuropathy Date Acquired: 02/19/2015 Weeks Of Treatment: 0 Clustered Wound: No Wound Measurements % Reduction in Area: % Reduction in Volume: Epithelialization: None Tunneling: No Undermining: No Wound Description Classification: Grade 1 Wound Margin: Flat and Intact Exudate Amount: Medium Exudate Type: Serosanguineous Exudate Color: red, brown Foul Odor After Cleansing: No Wound Bed Granulation Amount: None Present (0%) Exposed Structure Necrotic Amount: None Present (0%) Fascia Exposed: No Fat Layer Exposed: No Tendon Exposed: No Muscle Exposed: No Joint Exposed: No Bone Exposed: No Limited to Skin Breakdown Periwound Skin Texture Texture Color No Abnormalities Noted:  No No Abnormalities Noted: No Callus: No Atrophie Blanche: No Crepitus: No Cyanosis: No Excoriation: No Ecchymosis: No [name], [name] ([id number]) Fluctuance: No Erythema: Yes Friable: No Erythema Location: Circumferential Induration: No Hemosiderin Staining: No Localized Edema: Yes Mottled: No Rash: No Pallor: No Scarring: No Rubor: No Moisture Temperature / Pain No Abnormalities Noted: No Temperature: Hot Dry / Scaly: No Tenderness on Palpation: Yes Maceration: No Moist: No Wound Preparation Ulcer Cleansing: Rinsed/Irrigated with Saline Topical Anesthetic Applied: Other: lidocaine 4%, Electronic Signature(s) Signed: 02/19/2015 4:52:57 PM By: Montey Hora Entered By: Montey Hora on 02/19/2015 15:50:10 [name] ([id number]) -------------------------------------------------------------------------------- Vitals Details Patient Name: [name] Date of Service: 02/19/2015 3:30 PM Medical Record Number: [id number] Patient Account Number: [id number] Date of Birth/Sex: [birth year] (50 y.o. Male) Treating RN: Montey Hora Primary Care Physician: [name] Other Clinician: Referring Physician: [name] Treating Physician/Extender: Benjaman Pott in Treatment: 6 Vital Signs Time Taken: 15:19 Temperature (F): 98.1 Height (in): 74 Pulse (bpm): 108 Weight (lbs): 288 Respiratory Rate (breaths/min): 18 Body  Mass Index (BMI): 37 Blood Pressure (mmHg): 122/74 Capillary Blood Glucose (mg/dl): 133 Reference Range: 80 - 120 mg / dl Electronic Signature(s) Signed: 02/19/2015 4:52:57 PM By: Montey Hora Entered By: Montey Hora on 02/19/2015 15:19:34

## 2015-03-08 NOTE — Progress Notes (Signed)
MARSTON, SHEWCHUK ([id number]) Visit Report for 02/19/2015 Chief Complaint Document Details LABON, FONT 02/19/2015 3:30 Patient Name: Date of Service: [name] Medical Record Patient Account Number: [id number] Number: Treating RN: Montey Hora Date of Birth/Sex: [birth year] (50 y.o. Male) Other Clinician: Primary Care Physician: Bryson Ha, Labrina Lines Referring Physician: [name] Physician/Extender: Weeks in Treatment: 6 Information Obtained from: Patient Chief Complaint Patients presents for treatment of an open diabetic ulcer on the plantar aspect of the right foot which she's had for about 4 weeks Electronic Signature(s) Signed: 03/08/2015 10:13:55 AM By: Judene Companion MD Entered By: Judene Companion on 02/19/2015 16:24:46 [name] ([id number]) -------------------------------------------------------------------------------- Debridement Details DSHAWN, MCELYEA 02/19/2015 3:30 Patient Name: Date of Service: [name] Medical Record Patient Account Number: [id number] Number: Treating RN: Montey Hora Date of Birth/Sex: [birth year] (50 y.o. Male) Other Clinician: Primary Care Physician: Bryson Ha, Leilanni Halvorson Referring Physician: [name] Physician/Extender: Weeks in Treatment: 6 Debridement Performed for Wound #2 Right,Lateral Metatarsal head fifth Assessment: Performed By: Physician Judene Companion, MD Debridement: Debridement Pre-procedure Yes Verification/Time Out Taken: Start Time: 15:47 Pain Control: Lidocaine 4% Topical Solution Level: Skin/Subcutaneous Tissue Total Area Debrided (L x 0.1 (cm) x 0.8 (cm) = 0.08 (cm) W): Tissue and other Viable, Non-Viable, Exudate material debrided: Instrument: Blade Bleeding: Moderate Hemostasis Achieved: Pressure End Time: 15:49 Procedural Pain: 0 Post Procedural Pain: 0 Response to Treatment: Procedure was tolerated well Post  Debridement Measurements of Total Wound Length: (cm) 0.1 Width: (cm) 0.8 Depth: (cm) 0.7 Volume: (cm) 0.044 Post Procedure Diagnosis Same as Pre-procedure Electronic Signature(s) Signed: 02/19/2015 4:52:57 PM By: Montey Hora Signed: 03/08/2015 10:13:55 AM By: Judene Companion MD Entered By: Montey Hora on 02/19/2015 15:51:46 [name] ([id number]) -------------------------------------------------------------------------------- HPI Details ARATH, OHARROW 02/19/2015 3:30 Patient Name: Date of Service: [name] Medical Record Patient Account Number: [id number] Number: Treating RN: Montey Hora Date of Birth/Sex: 1965-05-22 (50 y.o. Male) Other Clinician: Primary Care Physician: Bryson Ha, Manual [place] Referring Physician: [name] Physician/Extender: Weeks in Treatment: 6 History of Present Illness Location: plantar aspect of the right forefoot Quality: Patient reports No Pain. Severity: Patient states wound are getting worse. Duration: Patient has had the wound for < 4 weeks prior to presenting for treatment Timing: he has minimal discharge from the wound Context: The wound appeared gradually over time Modifying Factors: Other treatment(s) tried include:plan local care but not offloading Associated Signs and Symptoms: Patient reports having:no pain or discharge from the wound. HPI Description: This 50 year old male comes with an ulcerated area on the plantar aspect of the right foot which she's had for approximately a month. I have known him from a previous visit at [facility] wound center and was treated in the months of April and May 2016 and rapidly healed a left plantar ulcer with a total contact cast. He has been a diabetic for about 16 years and tries to keep active and is fairly compliant with his diabetes management. 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 Past medical history significant for hypertension,  hyperlipidemia, and status post appendectomy 1993. He does not smoke or drink alcohol. 01/14/2015 -- the patient had tolerated his total contact cast very well and had no problems and has had no systemic symptoms. However when his total contact cast was cut open he had excessive amount of purulent drainage in spite of being on antibiotics. He had had a recent x-ray done in the ER 12/22/2014 which showed IMPRESSION:No evidence of osseous erosion. Known soft  tissue ulceration is not well characterized on radiograph. Scattered vascular calcifications seen. his last hemoglobin A1c in December was 7.3. He has been on Augmentin and doxycycline for the last 2 weeks. 01/21/2015 -- his culture grew rare growth of Pantoea species an MR moderate growth of Candida parapsilosis. it is sensitive to levofloxacin. He has not heard back from the insurance company regarding his hyperbaric oxygen therapy. His MRI has not been done yet and we will try and get him an earlier date 01/28/2015 -- MRI was done last night -- IMPRESSION:1. Soft tissue ulcer overlying the plantar aspect of the fifth metatarsal head extending to the cortex. Subcortical marrow edema in the fifth metatarsal head with corresponding T1 hypointensity is concerning for early osteomyelitis of the plantar lateral aspect of the fifth metatarsal head. Chest x-ray done on 01/14/2015 shows bronchiectatic changes without infiltrate. EKG done on generally 17 2017 shows a normal sinus rhythm and is a normal EKG. DMICHAEL, WESLER ([id number]) 02/04/2015 -- he was asked to see Dr. [name] last week and had 2 appointments but had to cancel both due to pressures of work. Last night he has woken up with severe pain in the foot and leg and it is swollen up. No fever or no change in his blood glucose. Addendum: I spoke with Dr. [name] who kindly agreed to accept the patient for inpatient therapy and have also opened to the hospitalist Dr. [name],  and discuss details of the management including PICC line and repeat cultures. 12/12/2015-- -- was seen by Dr. [name] in the hospital and a PICC line was placed. He was to receive Ceftazidime 2 g every 12 hourly, oral levofloxacin 750 mg every 24 hourly and oral fluconazole 200 mg daily. The antibiotics were to be given for 4 weeks except the Diflucan was to be given for the first 2 weeks. Reviewed note from 02/10/2015 -- and Dr. [name] had recommended management for growth of MSSA and Serratia. He switched him from ceftazidime to ceftriaxone 2 g every 24 hours. Levofloxacin was stopped and he would continue on fluconazole for another week. He had asked me to decide whether further imaging was necessary and whether surgical debridement of the infected bone was needed. He is doing well and has been off work for this week and we will keep him off the next week. Electronic Signature(s) Signed: 03/08/2015 10:13:55 AM By: Judene Companion MD Entered By: Judene Companion on 02/19/2015 16:24:58 BRINDEN, LEVISON ([id number]) -------------------------------------------------------------------------------- Physical Exam Details REYAAN, HORBAL 02/19/2015 3:30 Patient Name: Date of Service: [name] Medical Record Patient Account Number: [id number] Number: Treating RN: Montey Hora Date of Birth/Sex: December 25, 1965 (50 y.o. Male) Other Clinician: Primary Care Physician: Bryson Ha, Abdulrahim Siddiqi Referring Physician: [name] Physician/Extender: Suella Grove in Treatment: 6 Electronic Signature(s) Signed: 03/08/2015 10:13:55 AM By: Judene Companion MD Entered By: Judene Companion on 02/19/2015 16:25:04 [name] -------------------------------------------------------------------------------- Physician Orders Details DISHAWN, RADILLA 02/19/2015 3:30 Patient Name: Date of Service: [name] Medical Record Patient Account Number:  [id number] Number: Treating RN: Montey Hora Date of Birth/Sex: [birth year] (50 y.o. Male) Other Clinician: Primary Care Physician: Bryson Ha, Tenessa Marsee Referring Physician: [name] Physician/Extender: Weeks in Treatment: 6 Verbal / Phone Orders: Yes Clinician: Montey Hora Read Back and Verified: Yes Diagnosis Coding ICD-10 Coding Code Description E11.621 Type 2 diabetes mellitus with foot ulcer L97.512 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d L84 Corns and callosities L02.611 Cutaneous abscess of right  foot M86.371 Chronic multifocal osteomyelitis, right ankle and foot Wound Cleansing Wound #1 Right,Plantar Foot o Cleanse wound with mild soap and water o May Shower, gently pat wound dry prior to applying new dressing. Wound #2 Right,Lateral Metatarsal head fifth o Cleanse wound with mild soap and water o May Shower, gently pat wound dry prior to applying new dressing. Anesthetic Wound #1 Right,Plantar Foot o Topical Lidocaine 4% cream applied to wound bed prior to debridement Wound #2 Right,Lateral Metatarsal head fifth o Topical Lidocaine 4% cream applied to wound bed prior to debridement Primary Wound Dressing Wound #1 Right,Plantar Foot o Aquacel Ag Wound #2 Right,Lateral Metatarsal head fifth o Iodoform packing Gauze Secondary Dressing Wound #1 Right,Plantar Foot QUETZAL, VERRIER. ([id number]) o Gauze and Kerlix/Conform Wound #2 Right,Lateral Metatarsal head fifth o Gauze and Kerlix/Conform Dressing Change Frequency Wound #1 Right,Plantar Foot o Change dressing every day. Wound #2 Right,Lateral Metatarsal head fifth o Other: - to be changed at [facility] on monday Follow-up Appointments Wound #1 Right,Plantar Foot o Other: - Monday Wound #2 Right,Lateral Metatarsal head fifth o Other: - Monday Off-Loading Wound #1 Right,Plantar Foot o Open toe surgical  shoe with peg assist. - front offload Wound #2 Right,Lateral Metatarsal head fifth o Open toe surgical shoe with peg assist. - front offload Electronic Signature(s) Signed: 02/19/2015 4:26:47 PM By: Montey Hora Signed: 03/08/2015 10:13:55 AM By: Judene Companion MD Entered By: Montey Hora on 02/19/2015 16:26:47 [name] ([id number]) -------------------------------------------------------------------------------- Problem List Details YU, DEARMENT 02/19/2015 3:30 Patient Name: Date of Service: [name] Medical Record Patient Account Number: [id number] Number: Treating RN: Montey Hora Date of Birth/Sex: October 15, 1965 (50 y.o. 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 Medical Record Patient Account Number: [id number] Number: Treating RN: Montey Hora Date of Birth/Sex: 1965/05/09 (50 y.o. Male) Other Clinician: Primary Care Physician: Bryson Ha, Lowella Kindley Referring Physician: [name] Physician/Extender: Weeks in Treatment:  6 Subjective Chief Complaint Information obtained from Patient Patients presents for treatment of an open diabetic ulcer on the plantar aspect of the right foot which she's had for about 4 weeks History of Present Illness (HPI) The following HPI elements were documented for the patient's wound: Location: plantar aspect of the right forefoot Quality: Patient reports No Pain. Severity: Patient states wound are getting worse. Duration: Patient has had the wound for < 4 weeks prior to presenting for treatment Timing: he has minimal discharge from the wound Context: The wound appeared gradually over time Modifying Factors: Other treatment(s) tried include:plan local care but not offloading Associated Signs and Symptoms: Patient reports having:no pain or discharge from the wound. This 50 year old male comes with an ulcerated area on the plantar aspect of the right foot which she's had for approximately a month. I have known him from a previous visit at [facility] wound center and was treated in the  months of April and May 2016 and rapidly healed a left plantar ulcer with a total contact cast. He has been a diabetic for about 16 years and tries to keep active and is fairly compliant with his diabetes management. 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 Past medical history significant for hypertension, hyperlipidemia, and status post appendectomy 1993. He does not smoke or drink alcohol. 01/14/2015 -- the patient had tolerated his total contact cast very well and had no problems and has had no systemic symptoms. However when his total contact cast was cut open he had excessive amount of purulent drainage in spite of being on antibiotics. He had had a recent x-ray done in the ER 12/22/2014 which showed IMPRESSION:No evidence of osseous erosion. Known soft tissue ulceration is not well characterized on radiograph. Scattered vascular calcifications seen. his last hemoglobin A1c in December was  7.3. He has been on Augmentin and doxycycline for the last 2 weeks. 01/21/2015 -- his culture grew rare growth of Pantoea species an MR moderate growth of Candida parapsilosis. it is sensitive to levofloxacin. He has not heard back from the insurance company regarding his hyperbaric oxygen therapy. WILLEM, CARDIEL ([id number]) His MRI has not been done yet and we will try and get him an earlier date 01/28/2015 -- MRI was done last night -- IMPRESSION:1. Soft tissue ulcer overlying the plantar aspect of the fifth metatarsal head extending to the cortex. Subcortical marrow edema in the fifth metatarsal head with corresponding T1 hypointensity is concerning for early osteomyelitis of the plantar lateral aspect of the fifth metatarsal head. Chest x-ray done on 01/14/2015 shows bronchiectatic changes without infiltrate. EKG done on generally 17 2017 shows a normal sinus rhythm and is a normal EKG. 02/04/2015 -- he was asked to see Dr. [name] last week and had 2 appointments but had to cancel both due to pressures of work. Last night he has woken up with severe pain in the foot and leg and it is swollen up. No fever or no change in his blood glucose. Addendum: I spoke with Dr. [name] who kindly agreed to accept the patient for inpatient therapy and have also opened to the hospitalist Dr. [name], and discuss details of the management including PICC line and repeat cultures. 12/12/2015-- -- was seen by Dr. [name] in the hospital and a PICC line was placed. He was to receive Ceftazidime 2 g every 12 hourly, oral levofloxacin 750 mg every 24 hourly and oral fluconazole 200 mg daily. The antibiotics were to be given for 4 weeks except the Diflucan was to be given for the first 2 weeks. Reviewed note from 02/10/2015 -- and Dr. [name] had recommended management for growth of MSSA and Serratia. He switched him from ceftazidime to ceftriaxone 2 g every 24 hours. Levofloxacin was stopped  and he would continue on fluconazole for another week. He had asked me to decide whether further imaging was necessary and whether surgical debridement of the infected bone was needed. He is doing well and has been off work for this week and we will keep him off the next week. Objective Constitutional Vitals Time Taken: 3:19 PM, Height: 74 in, Weight: 288 lbs, BMI: 37, Temperature: 98.1 F, Pulse: 108 bpm, Respiratory Rate: 18 breaths/min, Blood Pressure: 122/74 mmHg, Capillary Blood Glucose: 133 mg/dl. Integumentary (Hair, Skin) Wound #1 status is Open. Original cause of wound was Gradually Appeared. The wound is located on the [place]. The wound measures 0.9cm length x 0.9cm width x 0.1cm depth;  0.636cm^2 area and 0.064cm^3 volume. The wound is limited to skin breakdown. There is no tunneling or undermining noted. There is a large amount of serosanguineous drainage noted. The wound margin is distinct with the outline attached to the wound base. There is large (67-100%) red, pink granulation within the wound bed. There is a small (1-33%) amount of necrotic tissue within the wound bed including Adherent Slough. The periwound skin appearance exhibited: Callus, Moist. The periwound skin appearance did not exhibit: Crepitus, Excoriation, Fluctuance, Friable, Induration, Localized Edema, Rash, Scarring, Dry/Scaly, Maceration, Atrophie Blanche, Cyanosis, Ecchymosis, Hemosiderin Staining, Mottled, Pallor, Rubor, Erythema. Periwound temperature was noted as No Abnormality. VEARL, LUKOMSKI ([id number]) Wound #2 status is Open. Original cause of wound was Bump. The wound is located on the Right,Lateral Metatarsal head fifth. The wound is limited to skin breakdown. There is no tunneling or undermining noted. There is a medium amount of serosanguineous drainage noted. The wound margin is flat and intact. There is no granulation within the wound bed. There is no necrotic tissue within the  wound bed. The periwound skin appearance exhibited: Localized Edema, Erythema. The periwound skin appearance did not exhibit: Callus, Crepitus, Excoriation, Fluctuance, Friable, Induration, Rash, Scarring, Dry/Scaly, Maceration, Moist, Atrophie Blanche, Cyanosis, Ecchymosis, Hemosiderin Staining, Mottled, Pallor, Rubor. The surrounding wound skin color is noted with erythema which is circumferential. Periwound temperature was noted as Hot. The periwound has tenderness on palpation. Assessment Active Problems ICD-10 E11.621 - Type 2 diabetes mellitus with foot ulcer L97.512 - 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d L84 - Corns and callosities L02.611 - Cutaneous abscess of right foot M86.371 - Chronic multifocal osteomyelitis, right ankle and foot Procedures Wound #2 Wound #2 is a Diabetic Wound/Ulcer of the Lower Extremity located on the Right,Lateral Metatarsal head fifth . There was a Skin/Subcutaneous Tissue Debridement [id number]) debridement with total area of 0.08 sq cm performed by Judene Companion, MD. with the following instrument(s): Blade to remove Viable and Non-Viable tissue/material including Exudate after achieving pain control using Lidocaine 4% Topical Solution. A time out was conducted prior to the start of the procedure. A Moderate amount of bleeding was controlled with Pressure. The procedure was tolerated well with a pain level of 0 throughout and a pain level of 0 following the procedure. Post Debridement Measurements: 0.1cm length x 0.8cm width x 0.7cm depth; 0.044cm^3 volume. Post procedure Diagnosis Wound #2: Same as Pre-Procedure Plan TRAVION, FRYMIRE ([id number]) Diabetic ulcer right foot . Rx silver alginte. Small abscess now present on lateral dorsal foot I..and D done and iodoform placed. On antibiotics with Dr. [name] .Marland Kitchen On HBO. RTC mon. for Dr. Con Memos to see Electronic Signature(s) Signed: 03/08/2015 10:13:55 AM  By: Judene Companion MD Entered By: Judene Companion on 02/19/2015 16:30:28 [name] ([id number]) -------------------------------------------------------------------------------- SuperBill Details Patient Name: [name] Date of Service: 02/19/2015 Medical Record Number: [id number] Patient Account Number: [id number] Date of Birth/Sex: 03/07/1965 (50 y.o. Male) Treating RN: Montey Hora Primary Care Physician: [name] Other Clinician: Referring Physician: [name] Treating Physician/Extender: Benjaman Pott in Treatment: 6 Diagnosis Coding ICD-10 Codes Code Description E11.621 Type 2 diabetes mellitus with foot ulcer L97.512 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d L84 Corns and callosities L02.611 Cutaneous abscess of right foot M86.371 Chronic multifocal osteomyelitis, right ankle and foot Facility Procedures CPT4 Code: [id number] Description: [id number] - DEB SUBQ TISSUE 20 SQ CM/< ICD-10 Description Diagnosis E11.621 Type 2 diabetes mellitus with  foot ulcer Modifier: Quantity: 1 Physician Procedures CPT4 Code Description: [id number] - WC PHYS LEVEL 3 - EST PT ICD-10 Description Diagnosis L97.512 Non-pressure chronic ulcer of other part of right fo Modifier: ot with fat lay Quantity: 1 er exposed CPT4 Code Description: [id number] - WC PHYS SUBQ TISS 20 SQ CM ICD-10 Description Diagnosis E11.621 Type 2 diabetes mellitus with foot ulcer Modifier: Quantity: 1 Electronic Signature(s) Signed: 03/08/2015 10:13:55 AM By: Judene Companion MD Entered By: Judene Companion on 02/19/2015 16:31:09

## 2015-03-08 NOTE — Progress Notes (Signed)
[name], [name] ([id number]) Visit Report for 02/19/2015 Physician Orders Details RICKIE, COYE 02/19/2015 1:30 Patient Name: Date of Service: [name] Medical Record Patient Account Number: [id number] Number: Treating RN: Date of Birth/Sex: Jan 03, 1965 (50 y.o. Male) Other Clinician: [name] Primary Care Physician: Bryson Ha, Macyn Remmert Referring Physician: [name] Physician/Extender: Suella Grove in Treatment: 6 Verbal / Phone Orders: No Diagnosis Coding ICD-10 Coding Code Description E11.621 Type 2 diabetes mellitus with foot ulcer L97.512 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d L84 Corns and callosities L02.611 Cutaneous abscess of right foot M86.371 Chronic multifocal osteomyelitis, right ankle and foot Electronic Signature(s) Signed: 03/08/2015 10:13:55 AM By: Judene Companion MD Entered By: Judene Companion on 02/19/2015 16:23:58 [name] -------------------------------------------------------------------------------- Problem List Details HERMON, FIERSTEIN 02/19/2015 1:30 Patient Name: Date of Service: [name] Medical Record Patient Account Number: [id number] Number: Treating RN: Date of Birth/Sex: January 02, 1966 (50 y.o. Male) Other Clinician: [name] Primary Care Physician: Elizabeth Palau Referring Physician: [name] Physician/Extender: Suella Grove in Treatment: 6 Active Problems ICD-10 Encounter Code Description Active Date Diagnosis E11.621 Type 2 diabetes mellitus with foot ulcer 01/07/2015 Yes L97.512 Non-pressure chronic ulcer of other part of right foot with 01/07/2015 Yes fat layer exposed L84 Corns and callosities 01/07/2015 Yes L02.611 Cutaneous abscess of right foot 01/14/2015 Yes M86.371 Chronic multifocal osteomyelitis, right ankle and foot 01/28/2015 Yes Inactive Problems Resolved Problems Electronic  Signature(s) Signed: 03/08/2015 10:13:55 AM By: Judene Companion MD Entered By: Judene Companion on 02/19/2015 16:23:47 [name] -------------------------------------------------------------------------------- SuperBill Details Patient Name: [name] Date of Service: 02/19/2015 Medical Record Number: [id number] Patient Account Number: [id number] Date of Birth/Sex: 06/07/1965 (50 y.o. 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 Chronic multifocal osteomyelitis, right ankle and foot Facility Procedures CPT4 Code: [id number] Description: (Facility Use Only) HBOT, full body chamber, 26min Modifier: Quantity: 4 Physician Procedures CPT4 Code Description: [id number] - WC PHYS HYPERBARIC OXYGEN THERAPY ICD-10 Description Diagnosis M86.371 Chronic multifocal osteomyelitis, right ankle and foot L97.512 Non-pressure chronic ulcer of other part of right foot Modifier: with fat lay Quantity: 1 er exposed Electronic Signature(s) Signed: 02/22/2015 9:55:35 AM By: Judene Companion MD Entered By: Judene Companion on 02/22/2015 09:55:35

## 2015-03-08 NOTE — Telephone Encounter (Signed)
Pt called and wanted you to know he was still in the hospital at [facility]. He was told to make an appt to follow up with you today.He said he believes you are the one who has to release him from the hospital.

## 2015-03-08 NOTE — Progress Notes (Signed)
Hypoglycemic Event  CBG: 69  Treatment: 15 GM carbohydrate snack  Symptoms: None  Follow-up CBG: Time:10:51 CBG Result:102  Possible Reasons for Event: Inadequate meal intake  Comments/MD notified:n/a    [name]

## 2015-03-08 NOTE — Progress Notes (Signed)
ABDULKADIR, MCAULAY ([id number]) Visit Report for 02/19/2015 Arrival Information Details [name], [name] 02/19/2015 1:30 Patient Name: Date of Service: [name] Medical Record Patient Account Number: [id number] Number: Treating RN: Date of Birth/Sex: [birth year] (50 y.o. Male) Other Clinician: [name] Primary Care Physician: [name] Treating Britto, Errol Referring Physician: [name] Physician/Extender: Suella Grove in Treatment: 6 Visit Information History Since Last Visit Added or deleted any medications: No Patient Arrived: Ambulatory Any new allergies or adverse reactions: No Arrival Time: 12:45 Had a fall or experienced change in No Accompanied By: self activities of daily living that may affect Transfer Assistance: None risk of falls: Patient Identification Verified: Yes Signs or symptoms of abuse/neglect No Secondary Verification Process Yes since last visito Completed: Hospitalized since last visit: No Patient Requires Transmission-Based No Has Dressing in Place as Prescribed: Yes Precautions: Has Footwear/Offloading in Place as Yes Patient Has Alerts: Yes Prescribed: Left: Surgical Shoe with Pressure Relief Insole Pain Present Now: No Electronic Signature(s) Signed: 02/19/2015 4:40:21 [name] By: Lorine Bears RCP, RRT, CHT Entered By: Lorine Bears on 02/19/2015 12:53:37 [name] -------------------------------------------------------------------------------- Encounter Discharge Information Details [name], [name] 02/19/2015 1:30 Patient Name: Date of Service: [name] Medical Record Patient Account Number: [id number] Number: Treating RN: Date of Birth/Sex: 10-18-1965 (50 y.o. Male) Other Clinician: [name] Primary Care Physician: Bryson Ha, PETER Referring Physician: [name] Physician/Extender: Suella Grove in Treatment:  6 Encounter Discharge Information Items Discharge Pain Level: 0 Discharge Condition: Stable Ambulatory Status: Ambulatory Discharge Destination: Home Private Transportation: Auto Accompanied By: self Schedule Follow-up Appointment: No Medication Reconciliation completed and No provided to Patient/Care Kennidi Yoshida: Clinical Summary of Care: Notes Patient has an HBO treatment scheduled on 02/22/15 at 13:00 [name]. Electronic Signature(s) Signed: 03/08/2015 10:13:55 AM By: Judene Companion MD Entered By: Judene Companion on 02/19/2015 16:24:13 [name] -------------------------------------------------------------------------------- Patient/Caregiver Education Details [name], [name] 02/19/2015 1:30 Patient Name: Date of Service: [name] Medical Record Patient Account Number: [id number] Number: Treating RN: Date of Birth/Gender: 07/11/1965 (50 y.o. Male) Other Clinician: [name] Primary Care Physician: Elizabeth Palau Referring Physician: [name] Physician/Extender: Suella Grove in Treatment: 6 Education Assessment Education Provided To: Patient Education Topics Provided Electronic Signature(s) Signed: 03/08/2015 10:13:55 AM By: Judene Companion MD Entered By: Judene Companion on 02/19/2015 16:24:09 [name] -------------------------------------------------------------------------------- Vitals Details [name], [name] 02/19/2015 1:30 Patient Name: Date of Service: [name] Medical Record Patient Account Number: [id number] Number: Treating RN: Date of Birth/Sex: [birth year] (50 y.o. Male) Other Clinician: [name] Primary Care Physician: [name] Treating Britto, Errol Referring Physician: [name] Physician/Extender: Suella Grove in Treatment: 6 Vital Signs Time Taken: 12:43 Temperature (F): 98.1 Height (in): 74 Pulse (bpm): 108 Weight (lbs): 288 Respiratory  Rate (breaths/min): 18 Body Mass Index (BMI): 37 Blood Pressure (mmHg): 122/74 Capillary Blood Glucose (mg/dl): 133 Reference Range: 80 - 120 mg / dl Electronic Signature(s) Signed: 02/19/2015 4:40:21 [name] By: Lorine Bears RCP, RRT, CHT Entered By: Lorine Bears on 02/19/2015 12:54:34

## 2015-03-09 ENCOUNTER — Encounter: Payer: Managed Care, Other (non HMO) | Admitting: Internal Medicine

## 2015-03-09 DIAGNOSIS — J101 Influenza due to other identified influenza virus with other respiratory manifestations: Secondary | ICD-10-CM | POA: Diagnosis present

## 2015-03-09 LAB — TISSUE CULTURE: Gram Stain: NONE SEEN

## 2015-03-09 LAB — BASIC METABOLIC PANEL
Anion gap: 9 (ref 5–15)
BUN: 21 mg/dL — ABNORMAL HIGH (ref 6–20)
CO2: 27 mmol/L (ref 22–32)
Calcium: 8 mg/dL — ABNORMAL LOW (ref 8.9–10.3)
Chloride: 98 mmol/L — ABNORMAL LOW (ref 101–111)
Creatinine, Ser: 0.95 mg/dL (ref 0.61–1.24)
GFR calc Af Amer: >60 mL/min (ref >60)
GFR calc non Af Amer: >60 mL/min (ref >60)
Glucose, Bld: 89 mg/dL (ref 65–99)
Potassium: 4.5 mmol/L (ref 3.5–5.1)
Sodium: 134 mmol/L — ABNORMAL LOW (ref 135–145)

## 2015-03-09 LAB — GLUCOSE, CAPILLARY
Glucose-Capillary: 102 mg/dL — ABNORMAL HIGH (ref 65–99)
Glucose-Capillary: 69 mg/dL (ref 65–99)
Glucose-Capillary: 135 mg/dL — ABNORMAL HIGH (ref 65–99)
Glucose-Capillary: 77 mg/dL (ref 65–99)
Glucose-Capillary: 113 mg/dL — ABNORMAL HIGH (ref 65–99)
Glucose-Capillary: 89 mg/dL (ref 65–99)

## 2015-03-09 LAB — ANAEROBIC CULTURE

## 2015-03-09 LAB — CBC
HCT: 38 % — ABNORMAL LOW (ref 39.0–52.0)
Hemoglobin: 12.5 g/dL — ABNORMAL LOW (ref 13.0–17.0)
MCH: 28.8 pg (ref 26.0–34.0)
MCHC: 32.9 g/dL (ref 30.0–36.0)
MCV: 87.6 fL (ref 78.0–100.0)
Platelets: 233 10*3/uL (ref 150–400)
RBC: 4.34 MIL/uL (ref 4.22–5.81)
RDW: 13.4 % (ref 11.5–15.5)
WBC: 11.2 10*3/uL — ABNORMAL HIGH (ref 4.0–10.5)

## 2015-03-09 NOTE — Progress Notes (Signed)
Progress Note   [name] [name] [id number] DOB: 1965/07/20 DOA: 03/04/2015 PCP: Tivis Ringer, MD   Brief Narrative:   [name] [name] is an 50 y.o. male with a PMH of insulin-dependent type 2 diabetes, chronic nonhealing diabetic foot wound who was admitted 03/04/15 for surgical debridement and amputation of his fifth metatarsal and toe. Cultures done prior to surgical debridement 4 weeks prior showed MSSA and Serratia.  Assessment/Plan:   Principal Problem:   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[place]), Status post right foot I&D with fifth metatarsal partial amputation and removal of toe/infected bone and soft tissue with culture on 03/04/15 - Operative culture showed no growth but prior cultures from wound 4 weeks prior to admission grew MSSA and Serratia. - 2 weeks of antibiotics recommended, narrow based on surgical cultures. Currently on cefepime and vancomycin. - Follow-up with Dr. Cannon Kettle post discharge.  Active Problems:   Influenza A/Fever with nausea/vomiting - Chest x-ray done 03/07/15 showed possible right perihilar infiltrate. Influenza A positive. Tamiflu started. - Follow-up blood cultures.    Type II diabetes mellitus (HCC) - Hemoglobin A1c 7.6%. - Currently being managed with Invokana, Amaryl, metformin and moderate scale SSI. CBGs 64-138. - Hold Amaryl given mildly low blood sugar this morning. Appetite has been poor.    Hyperlipidemia    Hypertension - Continue Avapro, Norvasc and HCTZ.    DVT Prophylaxis - Heparin ordered.   Family Communication/Anticipated D/C date and plan/Code Status   Family Communication: Wife at the bedside. Disposition Plan/date: Home when fever curve improved, possibly 03/10/15. Code Status: Full code.   IV Access:    PICC line   Procedures and diagnostic studies:   Dg Foot Complete Right  03/04/2015  CLINICAL DATA:  Status post fifth metatarsal amputation EXAM: RIGHT FOOT COMPLETE - 3+ VIEW COMPARISON:   03/04/2015 FINDINGS: There is been interval resection of the majority of the fifth metatarsal as well as the fifth toe. Some air is noted in the surgical bed. No other focal area of bony destruction is noted to suggest osteomyelitis. IMPRESSION: Status post fifth metatarsal and fifth toe amputation. Electronically Signed   By: Inez Catalina M.D.   On: 03/04/2015 21:33   Dg Foot Complete Right  03/04/2015  3 views of a skeletally mature individual were obtained of the right foot. Study includes AP, oblique, lateral projections. There is destructive changes to the head of the 5th metatarsal and to the 5th toe consistent with osteomyelitis. No soft tissue emphysema. There is swelling around the 5th MTPJ. No evidence of acute fracture. Vessel calcification is present.     Medical Consultants:    Podiatry  Anti-Infectives:    Anti-infectives    Start     Dose/Rate Route Frequency Ordered Stop   03/08/15 2200  oseltamivir (TAMIFLU) capsule 75 mg     75 mg Oral 2 times daily 03/08/15 2100 03/13/15 2159   03/08/15 0000  vancomycin 1,250 mg in sodium chloride 0.9 % 250 mL     1,250 mg 166.7 mL/hr over 90 Minutes Intravenous Every 12 hours 03/08/15 1048     03/08/15 0000  cefTRIAXone (ROCEPHIN) 1 g injection  Status:  Discontinued     2 g Intramuscular  Once 03/08/15 1056 03/08/15    03/07/15 1300  vancomycin (VANCOCIN) 1,250 mg in sodium chloride 0.9 % 250 mL IVPB     1,250 mg 166.7 mL/hr over 90 Minutes Intravenous Every 12 hours 03/07/15 1233     03/05/15 1900  cefTRIAXone (ROCEPHIN) 2 g in dextrose 5 % 50 mL IVPB  Status:  Discontinued     2 g 100 mL/hr over 30 Minutes Intravenous Every 24 hours 03/04/15 2133 03/05/15 1437   03/05/15 1845  levofloxacin (LEVAQUIN) tablet 750 mg  Status:  Discontinued     750 mg Oral Every 24 hours 03/04/15 2131 03/05/15 1437   03/05/15 1600  ceFEPIme (MAXIPIME) 2 g in dextrose 5 % 50 mL IVPB     2 g 100 mL/hr over 30 Minutes Intravenous 3 times per day  03/05/15 1506     03/04/15 2024  polymyxin B 500,000 Units, bacitracin 50,000 Units in sodium chloride irrigation 0.9 % 500 mL irrigation  Status:  Discontinued       As needed 03/04/15 2024 03/04/15 2032   03/04/15 1815  levofloxacin (LEVAQUIN) tablet 750 mg  Status:  Discontinued     750 mg Oral Daily 03/04/15 1803 03/04/15 2130   03/04/15 1815  cefTRIAXone (ROCEPHIN) 1 g in dextrose 5 % 50 mL IVPB  Status:  Discontinued     1 g 100 mL/hr over 30 Minutes Intravenous Every 24 hours 03/04/15 1803 03/04/15 2132      Subjective:   [name] [name] continues to feel bad with muscle aches, nausea, lethargy, cough, chest congestion. He does not yet feel ready for discharge.  Objective:    Filed Vitals:   03/08/15 1300 03/08/15 2045 03/09/15 0502 03/09/15 0600  BP: 139/69 130/61  119/60  Pulse: 101 76  84  Temp: 102.2 F (39 C) 100.2 F (37.9 C) 100.7 F (38.2 C) 99.1 F (37.3 C)  TempSrc: Oral Oral Oral Oral  Resp: 18 18  18   Height:      Weight:      SpO2: 93% 94%  93%    Intake/Output Summary (Last 24 hours) at 03/09/15 1438 Last data filed at 03/09/15 0511  Gross per 24 hour  Intake   1100 ml  Output    400 ml  Net    700 ml   Filed Weights   03/04/15 1359  Weight: 129.275 kg (285 lb)    Exam: Gen:  NAD Cardiovascular:  RRR, No M/R/G Respiratory:  Lungs with some scattered rhonchi Gastrointestinal:  Abdomen soft, NT/ND, + BS Extremities:  Left foot dressed, wound not examined today   Data Reviewed:    Labs: Basic Metabolic Panel:  Recent Labs Lab 03/04/15 1400 03/06/15 0452 03/07/15 0600 03/08/15 0539 03/09/15 0445  NA 137 138 135 137 134*  K 4.8 4.5 4.6 4.5 4.5  CL 99* 103 100* 98* 98*  CO2 23 25 24 29 27   GLUCOSE 194* 140* 112* 113* 89  BUN 27* 26* 20 19 21*  CREATININE 1.12 1.01 0.87 0.86 0.95  CALCIUM 9.3 8.9 8.9 8.6* 8.0*   GFR Estimated Creatinine Clearance: 134.4 mL/min (by C-G formula based on Cr of 0.95). Liver Function  Tests:  Recent Labs Lab 03/04/15 1400  AST 24  ALT 18  ALKPHOS 63  BILITOT 0.6  PROT 7.4  ALBUMIN 3.3*   Coagulation profile  Recent Labs Lab 03/04/15 2121  INR 1.09    CBC:  Recent Labs Lab 03/04/15 1400 03/06/15 0452 03/07/15 0600 03/08/15 0539 03/09/15 0445  WBC 13.2* 10.5 12.2* 11.0* 11.2*  HGB 15.0 13.4 13.4 13.3 12.5*  HCT 43.8 39.9 40.3 39.9 38.0*  MCV 86.2 87.5 86.1 86.6 87.6  PLT 267 227 226 221 233   CBG:  Recent Labs Lab 03/08/15 1700  03/08/15 2236 03/08/15 2301 03/09/15 0643 03/09/15 1210  GLUCAP 101* 69 102* 135* 77   Sepsis Labs:  Recent Labs Lab 03/04/15 2120 03/06/15 0452 03/07/15 0600 03/08/15 0539 03/09/15 0445  WBC  --  10.5 12.2* 11.0* 11.2*  LATICACIDVEN 1.3  --   --   --   --    Microbiology Recent Results (from the past 240 hour(s))  Anaerobic culture     Status: None (Preliminary result)   Collection Time: 03/04/15  7:58 PM  Result Value Ref Range Status   Specimen Description TISSUE RIGHT FOOT  Final   Special Requests 5TH METATARSAL PT ON VANC  Final   Gram Stain   Final    NO WBC SEEN MODERATE GRAM NEGATIVE RODS Performed at [facility]    Culture   Final    NO ANAEROBES ISOLATED; CULTURE IN PROGRESS FOR 5 DAYS Performed at [facility]    Report Status PENDING  Incomplete  Tissue culture     Status: None   Collection Time: 03/04/15  7:58 PM  Result Value Ref Range Status   Specimen Description TISSUE RIGHT FOOT  Final   Special Requests 5TH METATARSAL PT ON VANC  Final   Gram Stain   Final    NO WBC SEEN MODERATE GRAM NEGATIVE RODS Performed at [facility]    Culture   Final    RARE DIPHTHEROIDS(CORYNEBACTERIUM SPECIES) Note: Standardized susceptibility testing for this organism is not available. Performed at [facility]    Report Status 03/09/2015 FINAL  Final  Anaerobic culture     Status: None   Collection Time: 03/04/15  8:05 PM  Result Value Ref Range Status    Specimen Description TISSUE BONE RIGHT FOOT  Final   Special Requests 5TH METATARSAL PT ON VANC  Final   Gram Stain   Final    RARE WBC PRESENT, PREDOMINANTLY MONONUCLEAR NO ORGANISMS SEEN Performed at [facility]    Culture   Final    NO ANAEROBES ISOLATED Performed at [facility]    Report Status 03/09/2015 FINAL  Final  Tissue culture     Status: None   Collection Time: 03/04/15  8:05 PM  Result Value Ref Range Status   Specimen Description TISSUE BONE RIGHT FOOT  Final   Special Requests 5TH METETARSAL PT ON VANC  Final   Gram Stain   Final    RARE WBC PRESENT, PREDOMINANTLY MONONUCLEAR NO ORGANISMS SEEN Performed at [facility]    Culture   Final    RARE DIPHTHEROIDS(CORYNEBACTERIUM SPECIES) Note: Standardized susceptibility testing for this organism is not available. Performed at [facility]    Report Status 03/09/2015 FINAL  Final  Anaerobic culture     Status: None   Collection Time: 03/04/15  8:10 PM  Result Value Ref Range Status   Specimen Description WOUND RIGHT FOOT  Final   Special Requests SWAB OF 5TH METATARSAL PT ON VANC  Final   Gram Stain   Final    ABUNDANT WBC PRESENT,BOTH PMN AND MONONUCLEAR NO SQUAMOUS EPITHELIAL CELLS SEEN NO ORGANISMS SEEN Performed at [facility]    Culture   Final    NO ANAEROBES ISOLATED Performed at [facility]    Report Status 03/09/2015 FINAL  Final  Wound culture     Status: None   Collection Time: 03/04/15  8:10 PM  Result Value Ref Range Status   Specimen Description WOUND RIGHT FOOT  Final  Special Requests SWAB OF 5TH METATARSAL PT ON VANC  Final   Gram Stain   Final    ABUNDANT WBC PRESENT,BOTH PMN AND MONONUCLEAR NO SQUAMOUS EPITHELIAL CELLS SEEN NO ORGANISMS SEEN Performed at [facility]    Culture   Final    NO GROWTH 2 DAYS Performed at [facility]    Report Status 03/07/2015 FINAL  Final  Culture, blood (Routine X 2) w  Reflex to ID Panel     Status: None (Preliminary result)   Collection Time: 03/08/15  6:45 PM  Result Value Ref Range Status   Specimen Description BLOOD LEFT ANTECUBITAL  Final   Special Requests BOTTLES DRAWN AEROBIC AND ANAEROBIC 5CC  Final   Culture NO GROWTH < 24 HOURS  Final   Report Status PENDING  Incomplete  Culture, blood (Routine X 2) w Reflex to ID Panel     Status: None (Preliminary result)   Collection Time: 03/08/15  6:50 PM  Result Value Ref Range Status   Specimen Description BLOOD LEFT ANTECUBITAL  Final   Special Requests BOTTLES DRAWN AEROBIC AND ANAEROBIC 5CC  Final   Culture NO GROWTH < 24 HOURS  Final   Report Status PENDING  Incomplete     Medications:   . irbesartan  300 mg Oral Daily   And  . amLODipine  10 mg Oral Daily   And  . hydrochlorothiazide  25 mg Oral Daily  . aspirin EC  81 mg Oral Daily  . canagliflozin  300 mg Oral Daily  . ceFEPime (MAXIPIME) IV  2 g Intravenous 3 times per day  . heparin subcutaneous  5,000 Units Subcutaneous 3 times per day  . insulin aspart  0-15 Units Subcutaneous TID WC  . insulin aspart  0-5 Units Subcutaneous QHS  . metFORMIN  1,000 mg Oral BID WC  . methocarbamol  500 mg Oral BID  . omega-3 acid ethyl esters  2 g Oral Daily  . oseltamivir  75 mg Oral BID  . vancomycin  1,250 mg Intravenous Q12H   Continuous Infusions:   Time spent: 25 minutes.    LOS: 5 days   [facility] Pager [phone]. If unable to reach me by pager, please call my cell phone at [phone].  *Please refer to [email or web address], password [email or web address] to get updated schedule on who will round on this patient, as hospitalists switch teams weekly. If 7PM-7AM, please contact night-coverage at [email or web address], password [email or web address] for any overnight needs.  03/09/2015, 2:38 PM

## 2015-03-10 ENCOUNTER — Encounter: Payer: Managed Care, Other (non HMO) | Admitting: Internal Medicine

## 2015-03-10 ENCOUNTER — Inpatient Hospital Stay (HOSPITAL_COMMUNITY): Payer: Managed Care, Other (non HMO)

## 2015-03-10 DIAGNOSIS — L97509 Non-pressure chronic ulcer of other part of unspecified foot with unspecified severity: Secondary | ICD-10-CM

## 2015-03-10 DIAGNOSIS — E1169 Type 2 diabetes mellitus with other specified complication: Secondary | ICD-10-CM

## 2015-03-10 DIAGNOSIS — E11621 Type 2 diabetes mellitus with foot ulcer: Principal | ICD-10-CM

## 2015-03-10 DIAGNOSIS — M869 Osteomyelitis, unspecified: Secondary | ICD-10-CM

## 2015-03-10 LAB — URINALYSIS, ROUTINE W REFLEX MICROSCOPIC
Bilirubin Urine: NEGATIVE
Glucose, UA: >1000 mg/dL — AB
Hgb urine dipstick: NEGATIVE
Ketones, ur: 40 mg/dL — AB
Leukocytes, UA: NEGATIVE
Nitrite: NEGATIVE
Protein, ur: NEGATIVE mg/dL
Specific Gravity, Urine: 1.027 (ref 1.005–1.030)
pH: 5 (ref 5.0–8.0)

## 2015-03-10 LAB — GLUCOSE, CAPILLARY
Glucose-Capillary: 83 mg/dL (ref 65–99)
Glucose-Capillary: 123 mg/dL — ABNORMAL HIGH (ref 65–99)
Glucose-Capillary: 146 mg/dL — ABNORMAL HIGH (ref 65–99)
Glucose-Capillary: 143 mg/dL — ABNORMAL HIGH (ref 65–99)

## 2015-03-10 LAB — CBC
HCT: 38.1 % — ABNORMAL LOW (ref 39.0–52.0)
Hemoglobin: 12.8 g/dL — ABNORMAL LOW (ref 13.0–17.0)
MCH: 29.2 pg (ref 26.0–34.0)
MCHC: 33.6 g/dL (ref 30.0–36.0)
MCV: 86.8 fL (ref 78.0–100.0)
Platelets: 223 10*3/uL (ref 150–400)
RBC: 4.39 MIL/uL (ref 4.22–5.81)
RDW: 13.1 % (ref 11.5–15.5)
WBC: 9.8 10*3/uL (ref 4.0–10.5)

## 2015-03-10 LAB — BASIC METABOLIC PANEL
Anion gap: 11 (ref 5–15)
BUN: 19 mg/dL (ref 6–20)
CO2: 25 mmol/L (ref 22–32)
Calcium: 8.1 mg/dL — ABNORMAL LOW (ref 8.9–10.3)
Chloride: 100 mmol/L — ABNORMAL LOW (ref 101–111)
Creatinine, Ser: 0.81 mg/dL (ref 0.61–1.24)
GFR calc Af Amer: >60 mL/min (ref >60)
GFR calc non Af Amer: >60 mL/min (ref >60)
Glucose, Bld: 139 mg/dL — ABNORMAL HIGH (ref 65–99)
Potassium: 4.3 mmol/L (ref 3.5–5.1)
Sodium: 136 mmol/L (ref 135–145)

## 2015-03-10 LAB — URINE MICROSCOPIC-ADD ON: RBC / HPF: NONE SEEN RBC/hpf (ref 0–5)

## 2015-03-10 MED ORDER — OSELTAMIVIR PHOSPHATE 75 MG PO CAPS: 75.0000 mg | ORAL_CAPSULE | Freq: Two times a day (BID) | ORAL | Status: AC

## 2015-03-10 NOTE — Progress Notes (Signed)
Progress Note   BENJAMYN [name] [id number] DOB: 1965-06-28 DOA: 03/04/2015 PCP: Tivis Ringer, MD   Brief Narrative:   [name] is an 50 y.o. male with a PMH of insulin-dependent type 2 diabetes, chronic nonhealing diabetic foot wound who was admitted 03/04/15 for surgical debridement and amputation of his fifth metatarsal and toe. Cultures done prior to surgical debridement 4 weeks prior showed MSSA and Serratia.  Assessment/Plan:    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[place]), Status post right foot I&D with fifth metatarsal partial amputation and removal of toe/infected bone and soft tissue with culture on 03/04/15 - Operative culture showed no growth but prior cultures from wound 4 weeks prior to admission grew MSSA and Serratia. - 2 weeks More of antibiotics recommended, narrow based on surgical cultures. Currently on cefepime and vancomycin. - Follow-up with Dr. Cannon Kettle post discharge. Has an appointment set for 03/16/2015. We'll defer actual stop date of antibiotics to Dr. Cannon Kettle.Likely discharge in the morning.     Influenza A/Fever with nausea/vomiting - Chest x-ray done 03/07/15 showed possible right perihilar infiltrate. Influenza A positive. Tamiflu started. - Follow-up blood cultures. Feels better this morning cultures so for negative.    Hyperlipidemia On diet control.    Hypertension - Continue Avapro, Norvasc and HCTZ.      DVT Prophylaxis - Heparin ordered.   Family Communication/Anticipated D/C date and plan/Code Status   Family Communication: Wife at the bedside. Disposition Plan/date: Home when fever curve improved, possibly 03/11/15. Code Status: Full code.   IV Access:    PICC line   Procedures and diagnostic studies:   Dg Foot Complete Right  03/04/2015  CLINICAL DATA:  Status post fifth metatarsal amputation EXAM: RIGHT FOOT COMPLETE - 3+ VIEW COMPARISON:  03/04/2015 FINDINGS: There is been interval resection of the majority of  the fifth metatarsal as well as the fifth toe. Some air is noted in the surgical bed. No other focal area of bony destruction is noted to suggest osteomyelitis. IMPRESSION: Status post fifth metatarsal and fifth toe amputation. Electronically Signed   By: Inez Catalina M.D.   On: 03/04/2015 21:33   Dg Foot Complete Right  03/04/2015  3 views of a skeletally mature individual were obtained of the right foot. Study includes AP, oblique, lateral projections. There is destructive changes to the head of the 5th metatarsal and to the 5th toe consistent with osteomyelitis. No soft tissue emphysema. There is swelling around the 5th MTPJ. No evidence of acute fracture. Vessel calcification is present.     Medical Consultants:    Podiatry  Anti-Infectives:    Anti-infectives    Start     Dose/Rate Route Frequency Ordered Stop   03/10/15 0000  oseltamivir (TAMIFLU) 75 MG capsule     75 mg Oral 2 times daily 03/10/15 1203 03/13/15 2359   03/08/15 2200  oseltamivir (TAMIFLU) capsule 75 mg     75 mg Oral 2 times daily 03/08/15 2100 03/13/15 2159   03/08/15 0000  vancomycin 1,250 mg in sodium chloride 0.9 % 250 mL     1,250 mg 166.7 mL/hr over 90 Minutes Intravenous Every 12 hours 03/08/15 1048     03/08/15 0000  cefTRIAXone (ROCEPHIN) 1 g injection  Status:  Discontinued     2 g Intramuscular  Once 03/08/15 1056 03/08/15    03/07/15 1300  vancomycin (VANCOCIN) 1,250 mg in sodium chloride 0.9 % 250 mL IVPB     1,250 mg 166.7 mL/hr over 90  Minutes Intravenous Every 12 hours 03/07/15 1233     03/05/15 1900  cefTRIAXone (ROCEPHIN) 2 g in dextrose 5 % 50 mL IVPB  Status:  Discontinued     2 g 100 mL/hr over 30 Minutes Intravenous Every 24 hours 03/04/15 2133 03/05/15 1437   03/05/15 1845  levofloxacin (LEVAQUIN) tablet 750 mg  Status:  Discontinued     750 mg Oral Every 24 hours 03/04/15 2131 03/05/15 1437   03/05/15 1600  ceFEPIme (MAXIPIME) 2 g in dextrose 5 % 50 mL IVPB     2 g 100 mL/hr over 30  Minutes Intravenous 3 times per day 03/05/15 1506     03/04/15 2024  polymyxin B 500,000 Units, bacitracin 50,000 Units in sodium chloride irrigation 0.9 % 500 mL irrigation  Status:  Discontinued       As needed 03/04/15 2024 03/04/15 2032   03/04/15 1815  levofloxacin (LEVAQUIN) tablet 750 mg  Status:  Discontinued     750 mg Oral Daily 03/04/15 1803 03/04/15 2130   03/04/15 1815  cefTRIAXone (ROCEPHIN) 1 g in dextrose 5 % 50 mL IVPB  Status:  Discontinued     1 g 100 mL/hr over 30 Minutes Intravenous Every 24 hours 03/04/15 1803 03/04/15 2132      Subjective:   [name] in bed, body aches better, no fevers this morning, no chest abdominal pain. No focal weakness.  Objective:    Filed Vitals:   03/09/15 0600 03/09/15 1510 03/09/15 1900 03/10/15 0604  BP: 119/60 118/54 123/57 128/67  Pulse: 84 95 76 77  Temp: 99.1 F (37.3 C) 99.1 F (37.3 C) 99.1 F (37.3 C) 98.6 F (37 C)  TempSrc: Oral Oral Oral Oral  Resp: 18 18 18 18   Height:      Weight:      SpO2: 93% 93% 92% 93%    Intake/Output Summary (Last 24 hours) at 03/10/15 1203 Last data filed at 03/10/15 0505  Gross per 24 hour  Intake    930 ml  Output      0 ml  Net    930 ml   Filed Weights   03/04/15 1359  Weight: 129.275 kg (285 lb)    Exam: Gen:  NAD Cardiovascular:  RRR, No M/R/G Respiratory:  Lungs with some scattered rhonchi Gastrointestinal:  Abdomen soft, NT/ND, + BS Extremities:  Left foot dressed, wound not examined today   Data Reviewed:    Labs: Basic Metabolic Panel:  Recent Labs Lab 03/06/15 0452 03/07/15 0600 03/08/15 0539 03/09/15 0445 03/10/15 0845  NA 138 135 137 134* 136  K 4.5 4.6 4.5 4.5 4.3  CL 103 100* 98* 98* 100*  CO2 25 24 29 27 25   GLUCOSE [id number]* 112* 113* 89 139*  BUN 26* 20 19 21* 19  CREATININE 1.01 0.87 0.86 0.95 0.81  CALCIUM 8.9 8.9 8.6* 8.0* 8.1*   GFR Estimated Creatinine Clearance: 157.6 mL/min (by C-G formula based on Cr of 0.81). Liver  Function Tests:  Recent Labs Lab 03/04/15 1400  AST 24  ALT 18  ALKPHOS 63  BILITOT 0.6  PROT 7.4  ALBUMIN 3.3*   Coagulation profile  Recent Labs Lab 03/04/15 2121  INR 1.09    CBC:  Recent Labs Lab 03/06/15 0452 03/07/15 0600 03/08/15 0539 03/09/15 0445 03/10/15 0845  WBC 10.5 12.2* 11.0* 11.2* 9.8  HGB 13.4 13.4 13.3 12.5* 12.8*  HCT 39.9 40.3 39.9 38.0* 38.1*  MCV 87.5 86.1 86.6 87.6 86.8  PLT 227  226 221 233 223   CBG:  Recent Labs Lab 03/09/15 1210 03/09/15 1635 03/09/15 2103 03/10/15 0633 03/10/15 1119  GLUCAP 77 113* 89 83 123*   Sepsis Labs:  Recent Labs Lab 03/04/15 2120  03/07/15 0600 03/08/15 0539 03/09/15 0445 03/10/15 0845  WBC  --   < > 12.2* 11.0* 11.2* 9.8  LATICACIDVEN 1.3  --   --   --   --   --   < > = values in this interval not displayed. Microbiology Recent Results (from the past 240 hour(s))  Anaerobic culture     Status: None (Preliminary result)   Collection Time: 03/04/15  7:58 PM  Result Value Ref Range Status   Specimen Description TISSUE RIGHT FOOT  Final   Special Requests 5TH METATARSAL PT ON VANC  Final   Gram Stain   Final    NO WBC SEEN MODERATE GRAM NEGATIVE RODS Performed at [facility]    Culture   Final    NO ANAEROBES ISOLATED; CULTURE IN PROGRESS FOR 5 DAYS Performed at [facility]    Report Status PENDING  Incomplete  Tissue culture     Status: None   Collection Time: 03/04/15  7:58 PM  Result Value Ref Range Status   Specimen Description TISSUE RIGHT FOOT  Final   Special Requests 5TH METATARSAL PT ON VANC  Final   Gram Stain   Final    NO WBC SEEN MODERATE GRAM NEGATIVE RODS Performed at [facility]    Culture   Final    RARE DIPHTHEROIDS(CORYNEBACTERIUM SPECIES) Note: Standardized susceptibility testing for this organism is not available. Performed at [facility]    Report Status 03/09/2015 FINAL  Final  Anaerobic culture     Status: None    Collection Time: 03/04/15  8:05 PM  Result Value Ref Range Status   Specimen Description TISSUE BONE RIGHT FOOT  Final   Special Requests 5TH METATARSAL PT ON VANC  Final   Gram Stain   Final    RARE WBC PRESENT, PREDOMINANTLY MONONUCLEAR NO ORGANISMS SEEN Performed at [facility]    Culture   Final    NO ANAEROBES ISOLATED Performed at [facility]    Report Status 03/09/2015 FINAL  Final  Tissue culture     Status: None   Collection Time: 03/04/15  8:05 PM  Result Value Ref Range Status   Specimen Description TISSUE BONE RIGHT FOOT  Final   Special Requests 5TH METETARSAL PT ON VANC  Final   Gram Stain   Final    RARE WBC PRESENT, PREDOMINANTLY MONONUCLEAR NO ORGANISMS SEEN Performed at [facility]    Culture   Final    RARE DIPHTHEROIDS(CORYNEBACTERIUM SPECIES) Note: Standardized susceptibility testing for this organism is not available. Performed at [facility]    Report Status 03/09/2015 FINAL  Final  Anaerobic culture     Status: None   Collection Time: 03/04/15  8:10 PM  Result Value Ref Range Status   Specimen Description WOUND RIGHT FOOT  Final   Special Requests SWAB OF 5TH METATARSAL PT ON VANC  Final   Gram Stain   Final    ABUNDANT WBC PRESENT,BOTH PMN AND MONONUCLEAR NO SQUAMOUS EPITHELIAL CELLS SEEN NO ORGANISMS SEEN Performed at [facility]    Culture   Final    NO ANAEROBES ISOLATED Performed at [facility]    Report Status 03/09/2015 FINAL  Final  Wound culture  Status: None   Collection Time: 03/04/15  8:10 PM  Result Value Ref Range Status   Specimen Description WOUND RIGHT FOOT  Final   Special Requests SWAB OF 5TH METATARSAL PT ON VANC  Final   Gram Stain   Final    ABUNDANT WBC PRESENT,BOTH PMN AND MONONUCLEAR NO SQUAMOUS EPITHELIAL CELLS SEEN NO ORGANISMS SEEN Performed at [facility]    Culture   Final    NO GROWTH 2 DAYS Performed at [facility]    Report  Status 03/07/2015 FINAL  Final  Culture, blood (Routine X 2) w Reflex to ID Panel     Status: None (Preliminary result)   Collection Time: 03/08/15  6:45 PM  Result Value Ref Range Status   Specimen Description BLOOD LEFT ANTECUBITAL  Final   Special Requests BOTTLES DRAWN AEROBIC AND ANAEROBIC 5CC  Final   Culture NO GROWTH < 24 HOURS  Final   Report Status PENDING  Incomplete  Culture, blood (Routine X 2) w Reflex to ID Panel     Status: None (Preliminary result)   Collection Time: 03/08/15  6:50 PM  Result Value Ref Range Status   Specimen Description BLOOD LEFT ANTECUBITAL  Final   Special Requests BOTTLES DRAWN AEROBIC AND ANAEROBIC 5CC  Final   Culture NO GROWTH < 24 HOURS  Final   Report Status PENDING  Incomplete     Medications:   . irbesartan  300 mg Oral Daily   And  . amLODipine  10 mg Oral Daily   And  . hydrochlorothiazide  25 mg Oral Daily  . aspirin EC  81 mg Oral Daily  . canagliflozin  300 mg Oral Daily  . ceFEPime (MAXIPIME) IV  2 g Intravenous 3 times per day  . heparin subcutaneous  5,000 Units Subcutaneous 3 times per day  . insulin aspart  0-15 Units Subcutaneous TID WC  . insulin aspart  0-5 Units Subcutaneous QHS  . metFORMIN  1,000 mg Oral BID WC  . methocarbamol  500 mg Oral BID  . omega-3 acid ethyl esters  2 g Oral Daily  . oseltamivir  75 mg Oral BID  . vancomycin  1,250 mg Intravenous Q12H   Continuous Infusions:   Time spent: 25 minutes.    LOS: 6 days   03/10/2015, 12:03 PM   Signature  Lala Lund K M.D on 03/10/2015 at 12:03 PM  Between 7am to 7pm - Pager - [phone], After 7pm go to [email or web address] - password [facility]  [facility]  - Office  [phone]

## 2015-03-10 NOTE — Progress Notes (Addendum)
Pharmacy Antibiotic Note  [name] is a 50 y.o. male admitted on 03/04/2015 with diabetic foot ucler with osteomyeletis.  Diabetic foot ulcer w/ osteomyelitis, s/p amputation.  Culture from 4 wks ago showed MSSA/Serratia. Tissue culture from 3/2 + for rare diptheroids- this most likely is a contaminant. + Flu A on 3/6 - on tamiflu  Day # 6 cefepime, day # 4 vancomycin.  Scr stable, WBC WNL. 3/8 CXR: patchy airspace opacity in RUL compared to 03/07/15 - may represent new infiltrate or atelectasis.  D/w Dr. Candiss Norse: plan to DC home on IV vanc/cefepime with outpatient f/u w/ podiatrist Dr. Cannon Kettle.   Plan: currently continues on Cefepime 2 gm IV q8h and Vancomycin 1250 mg iv Q 12 hours Will check vancomycin trough prior to 0100 am dose for home vancomycin dosing Consider: change to ceftriaxone 2 gm IV q24 for home IV abx convenience  Height: 6\' 2"  (188 cm) Weight: 285 lb (129.275 kg) IBW/kg (Calculated) : 82.2  Temp (24hrs), Avg:98.7 F (37.1 C), Min:98.1 F (36.7 C), Max:99.1 F (37.3 C)   Recent Labs Lab 03/04/15 2120 03/06/15 0452 03/07/15 0600 03/08/15 0539 03/09/15 0445 03/10/15 0845  WBC  --  10.5 12.2* 11.0* 11.2* 9.8  CREATININE  --  1.01 0.87 0.86 0.95 0.81  LATICACIDVEN 1.3  --   --   --   --   --     Estimated Creatinine Clearance: 157.6 mL/min (by C-G formula based on Cr of 0.81).    No Known Allergies  Antimicrobials this admission: 3/2 CXT >> 3/3 3/2 lv q  >> 3/3 Cefepime 3/3>> Vancomycin 3/5>  Dose adjustments this admission: None  Microbiology results: 3/2 R foot wound>>ngF 3/2 R foot tissue>>rare diptheroids F 2/20 toe NGF 2/3 R foot>> staph aureus/serratia marcescens 3/6 Flu ++++ 3/6 BCx2>>ngtd  Eudelia Bunch, Pharm.D. [id number] 03/10/2015 1:40 PM

## 2015-03-11 ENCOUNTER — Encounter: Payer: Managed Care, Other (non HMO) | Admitting: Surgery

## 2015-03-11 LAB — ANAEROBIC CULTURE: Gram Stain: NONE SEEN

## 2015-03-11 LAB — GLUCOSE, CAPILLARY
Glucose-Capillary: 111 mg/dL — ABNORMAL HIGH (ref 65–99)
Glucose-Capillary: 149 mg/dL — ABNORMAL HIGH (ref 65–99)

## 2015-03-11 LAB — URINE CULTURE: Culture: NO GROWTH

## 2015-03-11 LAB — VANCOMYCIN, TROUGH: Vancomycin Tr: 14 ug/mL (ref 10.0–20.0)

## 2015-03-11 MED ORDER — INVOKANA 300 MG PO TABS: 300.0000 mg | ORAL_TABLET | Freq: Every day | ORAL | Status: DC

## 2015-03-11 MED ORDER — HEPARIN SOD (PORK) LOCK FLUSH 100 UNIT/ML IV SOLN
250.0000 [IU] | INTRAVENOUS | Status: AC | PRN
  Administered 2015-03-11: 250 [IU]

## 2015-03-11 MED ORDER — VANCOMYCIN HCL 10 G IV SOLR
1500.0000 mg | Freq: Two times a day (BID) | INTRAVENOUS | Status: DC
  Administered 2015-03-11: 1500 mg via INTRAVENOUS
  Filled 2015-03-11 (×2): qty 1500

## 2015-03-11 MED ORDER — HEPARIN SOD (PORK) LOCK FLUSH 100 UNIT/ML IV SOLN: 250.0000 [IU] | INTRAVENOUS | Status: DC | PRN

## 2015-03-11 MED ORDER — DEXTROSE 5 % IV SOLN: 2.0000 g | Freq: Three times a day (TID) | INTRAVENOUS | Status: DC

## 2015-03-11 NOTE — Progress Notes (Signed)
Pharmacy Antibiotic Note  [name] is a 50 y.o. male admitted on 03/04/2015 with diabetic foot ucler with osteomyelitis.  Diabetic foot ulcer w/ osteomyelitis, s/p amputation.  Culture from 4 wks ago showed MSSA/Serratia. Tissue culture from 3/2 + for rare diptheroids- this most likely is a contaminant. + Flu A on 3/6 - on tamiflu. Day # 7 cefepime, day # 5 vancomycin.  Plan to DC home on IV vanc/cefepime with outpatient f/u w/ podiatrist Dr. Cannon Kettle.  Vancomycin trough 14 mcg/ml (slightly subtherapeutic for goal of 15-20 mcg/ml) on 1250mg  IV q12h   Plan:  Continue Cefepime 2 gm IV q8h  Change Vancomycin to 1500 mg IV Q 12 hours Will need another trough with homehealth 3-4 days post discharge Consider: change cefepime to ceftriaxone 2 gm IV q24 for home IV abx convenience  Height: 6\' 2"  (188 cm) Weight: 285 lb (129.275 kg) IBW/kg (Calculated) : 82.2  Temp (24hrs), Avg:98.4 F (36.9 C), Min:98.1 F (36.7 C), Max:98.6 F (37 C)   Recent Labs Lab 03/04/15 2120 03/06/15 0452 03/07/15 0600 03/08/15 0539 03/09/15 0445 03/10/15 0845 03/11/15 0055  WBC  --  10.5 12.2* 11.0* 11.2* 9.8  --   CREATININE  --  1.01 0.87 0.86 0.95 0.81  --   LATICACIDVEN 1.3  --   --   --   --   --   --   VANCOTROUGH  --   --   --   --   --   --  14    Estimated Creatinine Clearance: 157.6 mL/min (by C-G formula based on Cr of 0.81).    No Known Allergies  Antimicrobials this admission: 3/2 CXT >> 3/3 3/2 lv q  >> 3/3 Cefepime 3/3>> Vancomycin 3/5>  Dose adjustments this admission: None  Microbiology results: 3/2 R foot wound>>ngF 3/2 R foot tissue>>rare diptheroids F 2/20 toe NGF 2/3 R foot>> staph aureus/serratia marcescens 3/6 Flu ++++ 3/6 BCx2>>ngtd  Eudelia Bunch, Pharm.D. [id number] 03/11/2015 1:39 AM

## 2015-03-11 NOTE — Discharge Instructions (Signed)
Follow with Primary MD Tivis Ringer, MD in 7 days   Get CBC, CMP, 2 view Chest X ray checked  by Primary MD next visit.    Activity: As tolerated with Full fall precautions use walker/cane & assistance as needed   Disposition Home     Diet:   Heart Healthy Low Carb.  Accuchecks 4 times/day, Once in AM empty stomach and then before each meal. Log in all results and show them to your Prim.MD in 3 days. If any glucose reading is under 80 or above 300 call your Prim MD immidiately. Follow Low glucose instructions for glucose under 80 as instructed.   For Heart failure patients - Check your Weight same time everyday, if you gain over 2 pounds, or you develop in leg swelling, experience more shortness of breath or chest pain, call your Primary MD immediately. Follow Cardiac Low Salt Diet and 1.5 lit/day fluid restriction.   On your next visit with your primary care physician please Get Medicines reviewed and adjusted.   Please request your Prim.MD to go over all Hospital Tests and Procedure/Radiological results at the follow up, please get all Hospital records sent to your Prim MD by signing hospital release before you go home.   If you experience worsening of your admission symptoms, develop shortness of breath, life threatening emergency, suicidal or homicidal thoughts you must seek medical attention immediately by calling 911 or calling your MD immediately  if symptoms less severe.  You Must read complete instructions/literature along with all the possible adverse reactions/side effects for all the Medicines you take and that have been prescribed to you. Take any new Medicines after you have completely understood and accpet all the possible adverse reactions/side effects.   Do not drive, operating heavy machinery, perform activities at heights, swimming or participation in water activities or provide baby sitting services if your were admitted for syncope or siezures until you have  seen by Primary MD or a Neurologist and advised to do so again.  Do not drive when taking Pain medications.    Do not take more than prescribed Pain, Sleep and Anxiety Medications  Special Instructions: If you have smoked or chewed Tobacco  in the last 2 yrs please stop smoking, stop any regular Alcohol  and or any Recreational drug use.  Wear Seat belts while driving.   Please note  You were cared for by a hospitalist during your hospital stay. If you have any questions about your discharge medications or the care you received while you were in the hospital after you are discharged, you can call the unit and asked to speak with the hospitalist on call if the hospitalist that took care of you is not available. Once you are discharged, your primary care physician will handle any further medical issues. Please note that NO REFILLS for any discharge medications will be authorized once you are discharged, as it is imperative that you return to your primary care physician (or establish a relationship with a primary care physician if you do not have one) for your aftercare needs so that they can reassess your need for medications and monitor your lab values.

## 2015-03-11 NOTE — Progress Notes (Signed)
Pt discharge education and instructions completed with pt and spouse at bedside; both voices understanding and denied any questions. Pt IV capped by IV team per protocol. Pt handed his prescriptions for cefepime and oxycodone. Pt to pick up electronically sent prescriptions from preferred pharmacy on file. Pt discharge home with spouse to transport him home. Pt RLE incision dsg remains clean, dry and intact with no stain or active drainage/bleeding noted. Pt RLE ordered shoe on. Pt discharge home with spouse to transport him home. Pt transported off unit via wheelchair with belongings and spouse at side. PDarden Palmer Christa Fasig Rn

## 2015-03-11 NOTE — Discharge Summary (Addendum)
[name], is a 50 y.o. male  DOB [birth year]  MRN [id number].  Admission date:  03/04/2015  Admitting Physician  Waldemar Dickens, MD  Discharge Date:  03/11/2015   Primary MD  Tivis Ringer, MD  Recommendations for primary care physician for things to follow:   Check CBC, BMP next visit, follow with his podiatrist/foot surgeon closely, final antibiotic stop date is defer to patient's podiatrist.  UA check in 2 days   Admission Diagnosis  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[place]) [id number], E11.69, L97.509, M86.9]   Discharge Diagnosis  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[place]) [id number], E11.69, L97.509, M86.9]    Principal Problem:   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[place]) Active Problems:   Type II diabetes mellitus ([place])   Hyperlipidemia   Osteomyelitis ([place])   Fever, unspecified   Nausea and vomiting   Influenza A      Past Medical History  Diagnosis Date  . Diabetes mellitus without complication ([place])   . Hypertension     Past Surgical History  Procedure Laterality Date  . Appendectomy    . I&d extremity Right 03/04/2015    Procedure: IRRIGATION AND DEBRIDEMENT RIGHT FOOT;  Surgeon: Landis Martins, DPM;  Location: [facility];  Service: Podiatry;  Laterality: Right;  . Amputation Right 03/04/2015    Procedure: PARTIAL AMPUTATION RIGHT 5TH METATARSAL;  Surgeon: Landis Martins, DPM;  Location: [facility];  Service: Podiatry;  Laterality: Right;       HPI  from the history and physical done on the day of admission:    [name] is a 50 y.o. male with longstanding type II diabetes on insulin and oral agents. Several months ago patient was walking to improve glucose control. He subsequently developed an ulcer on the bottom of right foot (under 5th digit). He required wound care,  hyperbaric 02 and wound significantly improved. Then a couple of weeks ago patient developed a lesion on the same foot located on the lateral aspect of the fifth digit. Lesion was lanced, patient started on Levaquin and daily Rocephin via PICC. Seemed to be improving until a few days ago. Now with significant right foot pain / RLE swelling. Reports that an ultrasound doppler was done and negative for DVT. Patient had follow up with Podiatrist today who decided that amputation was required at this time. Arrangements were made for [place] to admit the patient, podiatrist will operate this evening      Hospital Course:     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[place]), Status post right foot I&D with fifth metatarsal partial amputation and removal of toe/infected bone and soft tissue with culture on 03/04/15 - Operative culture showed no growth but prior cultures from wound 4 weeks prior to admission grew MSSA and Serratia. - 2 weeks More of antibiotics recommended, narrow based on surgical cultures. Currently on cefepime and vancomycin. - Follow-up with Dr. Cannon Kettle post discharge. Has an appointment set for 03/16/2015. We'll defer actual stop date of antibiotics to Dr. Cannon Kettle.Likely discharge in the  morning. Has right arm PICC line which will be continued upon discharge.   Influenza A/Fever with nausea/vomiting - Chest x-ray done 03/07/15 showed possible right perihilar infiltrate. Influenza A positive. Tamiflu started. - Follow-up blood cultures were negative. Feels better this morning cultures so for negative.   Hyperlipidemia On diet control.   Hypertension - Continue Avapro, Norvasc and HCTZ.  - DM2 - continue home Rx hold Invocana x 4 days as had Ketones in UA, repeat UA in 2-3 days.     Discharge Condition: Fair  Follow UP  Follow-up Information    Follow up with Tivis Ringer, MD. Schedule an appointment as soon as possible for a visit in 1 week.   Specialty:  Internal Medicine    Why:  For lab work   Contact information:   [address] [place] [address] [phone]       Follow up with Landis Martins, DPM.   Specialty:  Podiatry   Why:  At your scheduled appt time.   Contact information:   [place] [address] [phone]       Follow up with [facility].   Why:  Someone from [facility] will contact you to arrange start time for Home Health RN   Contact information:   [address] [place] [address] [phone]        Consults obtained -  Podiatry  Diet and Activity recommendation: See Discharge Instructions below  Discharge Instructions           Discharge Instructions    Call MD for:  persistant nausea and vomiting    Complete by:  As directed      Call MD for:  redness, tenderness, or signs of infection (pain, swelling, redness, odor or green/yellow discharge around incision site)    Complete by:  As directed      Call MD for:  severe uncontrolled pain    Complete by:  As directed      Call MD for:  temperature >100.4    Complete by:  As directed      Diet - low sodium heart healthy    Complete by:  As directed      Diet Carb Modified    Complete by:  As directed      Discharge instructions    Complete by:  As directed   Follow with Primary MD Tivis Ringer, MD in 7 days   Get CBC, CMP, 2 view Chest X ray checked  by Primary MD next visit.    Activity: As tolerated with Full fall precautions use walker/cane & assistance as needed   Disposition Home     Diet:   Heart Healthy Low Carb.  Accuchecks 4 times/day, Once in AM empty stomach and then before each meal. Log in all results and show them to your Prim.MD in 3 days. If any glucose reading is under 80 or above 300 call your Prim MD immidiately. Follow Low glucose instructions for glucose under 80 as instructed.   For Heart failure patients - Check your Weight same time everyday, if you gain over 2  pounds, or you develop in leg swelling, experience more shortness of breath or chest pain, call your Primary MD immediately. Follow Cardiac Low Salt Diet and 1.5 lit/day fluid restriction.   On your next visit with your primary care physician please Get Medicines reviewed and adjusted.   Please request your Prim.MD to go over all [facility]  Tests and Procedure/Radiological results at the follow up, please get all Hospital records sent to your Prim MD by signing hospital release before you go home.   If you experience worsening of your admission symptoms, develop shortness of breath, life threatening emergency, suicidal or homicidal thoughts you must seek medical attention immediately by calling 911 or calling your MD immediately  if symptoms less severe.  You Must read complete instructions/literature along with all the possible adverse reactions/side effects for all the Medicines you take and that have been prescribed to you. Take any new Medicines after you have completely understood and accpet all the possible adverse reactions/side effects.   Do not drive, operating heavy machinery, perform activities at heights, swimming or participation in water activities or provide baby sitting services if your were admitted for syncope or siezures until you have seen by Primary MD or a Neurologist and advised to do so again.  Do not drive when taking Pain medications.    Do not take more than prescribed Pain, Sleep and Anxiety Medications  Special Instructions: If you have smoked or chewed Tobacco  in the last 2 yrs please stop smoking, stop any regular Alcohol  and or any Recreational drug use.  Wear Seat belts while driving.   Please note  You were cared for by a hospitalist during your hospital stay. If you have any questions about your discharge medications or the care you received while you were in the hospital after you are discharged, you can call the unit and asked to speak with the  hospitalist on call if the hospitalist that took care of you is not available. Once you are discharged, your primary care physician will handle any further medical issues. Please note that NO REFILLS for any discharge medications will be authorized once you are discharged, as it is imperative that you return to your primary care physician (or establish a relationship with a primary care physician if you do not have one) for your aftercare needs so that they can reassess your need for medications and monitor your lab values.     Discharge wound care:    Complete by:  As directed   Keep dressings intact until you follow up with your podiatrist.     Face-to-face encounter (required for Medicare/Medicaid patients)    Complete by:  As directed   I RAMA,CHRISTINA certify that this patient is under my care and that I, or a nurse practitioner or physician's assistant working with me, had a face-to-face encounter that meets the physician face-to-face encounter requirements with this patient on 03/08/2015. The encounter with the patient was in whole, or in part for the following medical condition(s) which is the primary reason for home health care (List medical condition): Osteomyelitis s/p amputation, on IV antibiotics.  The encounter with the patient was in whole, or in part, for the following medical condition, which is the primary reason for home health care:  Foot wound requiring IV antibiotics  I certify that, based on my findings, the following services are medically necessary home health services:  Nursing  Reason for Medically Necessary Home Health Services:   Skilled Nursing- Teaching of Disease Process/Symptom Management Skilled Nursing- Administration and Training of Injectable Medication    My clinical findings support the need for the above services:   OTHER SEE COMMENTS Pain interferes with ambulation/mobility    Further, I certify that my clinical findings support that this patient is homebound  due to:  Pain interferes with ambulation/mobility  Home Health    Complete by:  As directed   To provide the following care/treatments:  RN     Increase activity slowly    Complete by:  As directed              Discharge Medications       Medication List    STOP taking these medications        cefTRIAXone 1 g in dextrose 5 % 50 mL     levofloxacin 750 MG tablet  Commonly known as:  LEVAQUIN     methocarbamol 500 MG tablet  Commonly known as:  ROBAXIN      TAKE these medications        acetaminophen 325 MG tablet  Commonly known as:  TYLENOL  Take 2 tablets (650 mg total) by mouth every 6 (six) hours as needed for mild pain or headache.     aspirin EC 81 MG tablet  Take 81 mg by mouth daily.     ceFEPIme 2 g in dextrose 5 % 50 mL  Inject 2 g into the vein every 8 (eight) hours. Two-week supply then per Dr. Cannon Kettle     fluconazole 200 MG tablet  Commonly known as:  DIFLUCAN  Take 200 mg by mouth daily.     glimepiride 2 MG tablet  Commonly known as:  AMARYL  Take 2 mg by mouth 2 (two) times daily.     INVOKANA 300 MG Tabs tablet  Generic drug:  canagliflozin  Take 1 tablet (300 mg total) by mouth daily.  Start taking on:  03/15/2015     metFORMIN 1000 MG tablet  Commonly known as:  GLUCOPHAGE  Take 1,000 mg by mouth 2 (two) times daily.     multivitamin tablet  Take 1 tablet by mouth daily.     Olmesartan-Amlodipine-HCTZ 40-10-25 MG Tabs  Take 1 tablet by mouth daily. Reported on 02/04/2015     omega-3 acid ethyl esters 1 g capsule  Commonly known as:  LOVAZA  Take 2 g by mouth daily.     ondansetron 4 MG tablet  Commonly known as:  ZOFRAN  Take 1 tablet (4 mg total) by mouth every 6 (six) hours as needed for nausea.     oseltamivir 75 MG capsule  Commonly known as:  TAMIFLU  Take 1 capsule (75 mg total) by mouth 2 (two) times daily.     oxyCODONE-acetaminophen 5-325 MG tablet  Commonly known as:  ROXICET  Take 1 tablet by mouth every 6 (six)  hours as needed for severe pain.     traMADol 50 MG tablet  Commonly known as:  ULTRAM  Take 1 tablet (50 mg total) by mouth every 6 (six) hours as needed for moderate pain.     TRESIBA FLEXTOUCH 200 UNIT/ML Sopn  Generic drug:  Insulin Degludec  Inject 100 Units into the skin daily.     vancomycin 1,250 mg in sodium chloride 0.9 % 250 mL  Inject 1,250 mg into the vein every 12 (twelve) hours. Per home health RN        Major procedures and Radiology Reports - PLEASE review detailed and final reports for all details, in brief -       Dg Chest 2 View  03/07/2015  CLINICAL DATA:  Fever today to 100 degrees, productive cough, congestion since yesterday, hypertension, diabetes mellitus EXAM: CHEST  2 VIEW COMPARISON:  02/05/2015 FINDINGS: RIGHT arm PICC line with tip projecting over SVC. Enlargement of cardiac  silhouette. Mediastinal contours and pulmonary vascularity normal. Question subtle RIGHT perihilar infiltrate. Remaining lungs clear. No pleural effusion pneumothorax. IMPRESSION: Question subtle RIGHT perihilar infiltrate. Enlargement of cardiac silhouette. Electronically Signed   By: Lavonia Dana M.D.   On: 03/07/2015 13:21   US Venous Img Lower Unilateral Right  02/26/2015  CLINICAL DATA:  50 year old male with right lower extremity edema and right calf pain EXAM: RIGHT LOWER EXTREMITY VENOUS DOPPLER ULTRASOUND TECHNIQUE: Gray-scale sonography with graded compression, as well as color Doppler and duplex ultrasound were performed to evaluate the lower extremity deep venous systems from the level of the common femoral vein and including the common femoral, femoral, profunda femoral, popliteal and calf veins including the posterior tibial, peroneal and gastrocnemius veins when visible. The superficial great saphenous vein was also interrogated. Spectral Doppler was utilized to evaluate flow at rest and with distal augmentation maneuvers in the common femoral, femoral and popliteal veins.  COMPARISON:  None. FINDINGS: Contralateral Common Femoral Vein: Respiratory phasicity is normal and symmetric with the symptomatic side. No evidence of thrombus. Normal compressibility. Common Femoral Vein: No evidence of thrombus. Normal compressibility, respiratory phasicity and response to augmentation. Saphenofemoral Junction: No evidence of thrombus. Normal compressibility and flow on color Doppler imaging. Profunda Femoral Vein: No evidence of thrombus. Normal compressibility and flow on color Doppler imaging. Femoral Vein: No evidence of thrombus. Normal compressibility, respiratory phasicity and response to augmentation. Popliteal Vein: No evidence of thrombus. Normal compressibility, respiratory phasicity and response to augmentation. Calf Veins: No evidence of thrombus. Normal compressibility and flow on color Doppler imaging. Superficial Great Saphenous Vein: No evidence of thrombus. Normal compressibility and flow on color Doppler imaging. Venous Reflux:  None. Other Findings:  None. IMPRESSION: No evidence of deep venous thrombosis. Electronically Signed   By: Jacqulynn Cadet M.D.   On: 02/26/2015 16:20   Dg Chest Port 1 View  03/10/2015  CLINICAL DATA:  50 year old male with cough and shortness of breath EXAM: PORTABLE CHEST 1 VIEW COMPARISON:  Prior chest x-ray 03/07/2015 FINDINGS: Right upper extremity PICC has pulled back slightly. The catheter tip now overlies the mid SVC which remains in adequate position. Stable cardiomegaly and mediastinal contours. No evidence of pulmonary edema. Developing patchy airspace opacity in the right upper lung compared to relatively recent prior imaging. No pneumothorax or pleural effusion. No acute osseous abnormality. IMPRESSION: 1. Developing patchy airspace opacity in the right upper lung compared to 03/07/2015. Findings may represent new infiltrate or focal atelectasis. 2. The tip of the right upper extremity PICC projects over the mid SVC, slightly pulled  back compared to prior but still an acceptable position. 3. Stable cardiomegaly without evidence of failure. Electronically Signed   By: Jacqulynn Cadet M.D.   On: 03/10/2015 07:59   Dg Foot Complete Right  03/04/2015  CLINICAL DATA:  Status post fifth metatarsal amputation EXAM: RIGHT FOOT COMPLETE - 3+ VIEW COMPARISON:  03/04/2015 FINDINGS: There is been interval resection of the majority of the fifth metatarsal as well as the fifth toe. Some air is noted in the surgical bed. No other focal area of bony destruction is noted to suggest osteomyelitis. IMPRESSION: Status post fifth metatarsal and fifth toe amputation. Electronically Signed   By: Inez Catalina M.D.   On: 03/04/2015 21:33   Dg Foot Complete Right  03/04/2015  3 views of a skeletally mature individual were obtained of the right foot. Study includes AP, oblique, lateral projections. There is destructive changes to the head of the 5th metatarsal  and to the 5th toe consistent with osteomyelitis. No soft tissue emphysema. There is swelling around the 5th MTPJ. No evidence of acute fracture. Vessel calcification is present.    Micro Results      Recent Results (from the past 240 hour(s))  Anaerobic culture     Status: None   Collection Time: 03/04/15  7:58 PM  Result Value Ref Range Status   Specimen Description TISSUE RIGHT FOOT  Final   Special Requests 5TH METATARSAL PT ON VANC  Final   Gram Stain   Final    NO WBC SEEN MODERATE GRAM NEGATIVE RODS Performed at [facility]    Culture   Final    PREVOTELLA BIVIA Note: BETA LACTAMASE POSITIVE Performed at [facility]    Report Status 03/11/2015 FINAL  Final  Tissue culture     Status: None   Collection Time: 03/04/15  7:58 PM  Result Value Ref Range Status   Specimen Description TISSUE RIGHT FOOT  Final   Special Requests 5TH METATARSAL PT ON VANC  Final   Gram Stain   Final    NO WBC SEEN MODERATE GRAM NEGATIVE RODS Performed at [facility]      Culture   Final    RARE DIPHTHEROIDS(CORYNEBACTERIUM SPECIES) Note: Standardized susceptibility testing for this organism is not available. Performed at [facility]    Report Status 03/09/2015 FINAL  Final  Anaerobic culture     Status: None   Collection Time: 03/04/15  8:05 PM  Result Value Ref Range Status   Specimen Description TISSUE BONE RIGHT FOOT  Final   Special Requests 5TH METATARSAL PT ON VANC  Final   Gram Stain   Final    RARE WBC PRESENT, PREDOMINANTLY MONONUCLEAR NO ORGANISMS SEEN Performed at [facility]    Culture   Final    NO ANAEROBES ISOLATED Performed at [facility]    Report Status 03/09/2015 FINAL  Final  Tissue culture     Status: None   Collection Time: 03/04/15  8:05 PM  Result Value Ref Range Status   Specimen Description TISSUE BONE RIGHT FOOT  Final   Special Requests 5TH METETARSAL PT ON VANC  Final   Gram Stain   Final    RARE WBC PRESENT, PREDOMINANTLY MONONUCLEAR NO ORGANISMS SEEN Performed at [facility]    Culture   Final    RARE DIPHTHEROIDS(CORYNEBACTERIUM SPECIES) Note: Standardized susceptibility testing for this organism is not available. Performed at [facility]    Report Status 03/09/2015 FINAL  Final  Anaerobic culture     Status: None   Collection Time: 03/04/15  8:10 PM  Result Value Ref Range Status   Specimen Description WOUND RIGHT FOOT  Final   Special Requests SWAB OF 5TH METATARSAL PT ON VANC  Final   Gram Stain   Final    ABUNDANT WBC PRESENT,BOTH PMN AND MONONUCLEAR NO SQUAMOUS EPITHELIAL CELLS SEEN NO ORGANISMS SEEN Performed at [facility]    Culture   Final    NO ANAEROBES ISOLATED Performed at [facility]    Report Status 03/09/2015 FINAL  Final  Wound culture     Status: None   Collection Time: 03/04/15  8:10 PM  Result Value Ref Range Status   Specimen Description WOUND RIGHT FOOT  Final   Special Requests SWAB OF 5TH METATARSAL PT ON  VANC  Final   Gram Stain   Final    ABUNDANT  WBC PRESENT,BOTH PMN AND MONONUCLEAR NO SQUAMOUS EPITHELIAL CELLS SEEN NO ORGANISMS SEEN Performed at [facility]    Culture   Final    NO GROWTH 2 DAYS Performed at [facility]    Report Status 03/07/2015 FINAL  Final  Culture, blood (Routine X 2) w Reflex to ID Panel     Status: None (Preliminary result)   Collection Time: 03/08/15  6:45 PM  Result Value Ref Range Status   Specimen Description BLOOD LEFT ANTECUBITAL  Final   Special Requests BOTTLES DRAWN AEROBIC AND ANAEROBIC 5CC  Final   Culture NO GROWTH 3 DAYS  Final   Report Status PENDING  Incomplete  Culture, blood (Routine X 2) w Reflex to ID Panel     Status: None (Preliminary result)   Collection Time: 03/08/15  6:50 PM  Result Value Ref Range Status   Specimen Description BLOOD LEFT ANTECUBITAL  Final   Special Requests BOTTLES DRAWN AEROBIC AND ANAEROBIC 5CC  Final   Culture NO GROWTH 3 DAYS  Final   Report Status PENDING  Incomplete  Urine culture     Status: None   Collection Time: 03/10/15  2:08 PM  Result Value Ref Range Status   Specimen Description URINE, RANDOM  Final   Special Requests NONE  Final   Culture NO GROWTH 1 DAY  Final   Report Status 03/11/2015 FINAL  Final       Today   Subjective    [name] today has no headache,no chest abdominal pain,no new weakness tingling or numbness, feels much better wants to go home today.     Objective   Blood pressure 129/63, pulse 65, temperature 97.9 F (36.6 C), temperature source Oral, resp. rate 18, height 6\' 2"  (1.88 m), weight 129.275 kg (285 lb), SpO2 95 %.   Intake/Output Summary (Last 24 hours) at 03/11/15 1355 Last data filed at 03/11/15 0428  Gross per 24 hour  Intake    520 ml  Output      0 ml  Net    520 ml    Exam Awake Alert, Oriented x 3, No new F.N deficits, Normal affect [place].AT,PERRAL Supple Neck,No JVD, No cervical lymphadenopathy appriciated.    Symmetrical Chest wall movement, Good air movement bilaterally, CTAB RRR,No Gallops,Rubs or new Murmurs, No Parasternal Heave +ve B.Sounds, Abd Soft, Non tender, No organomegaly appriciated, No rebound -guarding or rigidity. No Cyanosis, Clubbing or edema, No new Rash or bruise,Right foot and bandage.   Data Review   CBC w Diff:  Lab Results  Component Value Date   WBC 9.8 03/10/2015   HGB 12.8* 03/10/2015   HCT 38.1* 03/10/2015   PLT 223 03/10/2015   LYMPHOPCT 12 12/30/2014   MONOPCT 9 12/30/2014   EOSPCT 0 12/30/2014   BASOPCT 0 12/30/2014    CMP:  Lab Results  Component Value Date   NA 136 03/10/2015   K 4.3 03/10/2015   CL 100* 03/10/2015   CO2 25 03/10/2015   BUN 19 03/10/2015   CREATININE 0.81 03/10/2015   PROT 7.4 03/04/2015   ALBUMIN 3.3* 03/04/2015   BILITOT 0.6 03/04/2015   ALKPHOS 63 03/04/2015   AST 24 03/04/2015   ALT 18 03/04/2015  .   Total Time in preparing paper work, data evaluation and todays exam - 35 minutes  Thurnell Lose M.D on 03/11/2015 at 1:55 PM  [facility]   Office  [phone]

## 2015-03-12 ENCOUNTER — Encounter: Payer: Managed Care, Other (non HMO) | Admitting: Surgery

## 2015-03-12 ENCOUNTER — Ambulatory Visit: Payer: Managed Care, Other (non HMO) | Admitting: Surgery

## 2015-03-13 LAB — CULTURE, BLOOD (ROUTINE X 2)
Culture: NO GROWTH
Culture: NO GROWTH

## 2015-03-15 ENCOUNTER — Encounter: Payer: Managed Care, Other (non HMO) | Admitting: Surgery

## 2015-03-15 DIAGNOSIS — L84 Corns and callosities: Secondary | ICD-10-CM | POA: Diagnosis not present

## 2015-03-15 DIAGNOSIS — E1140 Type 2 diabetes mellitus with diabetic neuropathy, unspecified: Secondary | ICD-10-CM | POA: Diagnosis not present

## 2015-03-15 DIAGNOSIS — I10 Essential (primary) hypertension: Secondary | ICD-10-CM | POA: Diagnosis not present

## 2015-03-15 DIAGNOSIS — L02611 Cutaneous abscess of right foot: Secondary | ICD-10-CM | POA: Diagnosis not present

## 2015-03-15 DIAGNOSIS — E11621 Type 2 diabetes mellitus with foot ulcer: Secondary | ICD-10-CM | POA: Diagnosis not present

## 2015-03-15 DIAGNOSIS — E785 Hyperlipidemia, unspecified: Secondary | ICD-10-CM | POA: Diagnosis not present

## 2015-03-15 DIAGNOSIS — L97512 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d: Secondary | ICD-10-CM | POA: Diagnosis not present

## 2015-03-15 DIAGNOSIS — Z794 Long term (current) use of insulin: Secondary | ICD-10-CM | POA: Diagnosis not present

## 2015-03-15 DIAGNOSIS — M86371 Chronic multifocal osteomyelitis, right ankle and foot: Secondary | ICD-10-CM | POA: Diagnosis not present

## 2015-03-15 LAB — GLUCOSE, CAPILLARY
Glucose-Capillary: 148 mg/dL — ABNORMAL HIGH (ref 65–99)
Glucose-Capillary: 128 mg/dL — ABNORMAL HIGH (ref 65–99)

## 2015-03-15 NOTE — Progress Notes (Signed)
[name], [name] ([id number]) Visit Report for 03/15/2015 Arrival Information Details [name], [name] 03/15/2015 1:00 Patient Name: Date of Service: [name] Medical Record Patient Account Number: [id number] Number: Treating RN: Date of Birth/Sex: [birth year] (50 y.o. Male) Other Clinician: [name] Primary Care Physician: [name] Treating Christin Fudge Referring Physician: [name] Physician/Extender: Suella Grove in Treatment: 9 Visit Information History Since Last Visit Added or deleted any medications: No Patient Arrived: Ambulatory Any new allergies or adverse reactions: No Arrival Time: 12:50 Had a fall or experienced change in No Accompanied By: self activities of daily living that may affect Transfer Assistance: None risk of falls: Patient Identification Verified: Yes Hospitalized since last visit: No Secondary Verification Process Yes Has Dressing in Place as Prescribed: Yes Completed: Has Footwear/Offloading in Place as Yes Patient Requires Transmission-Based No Prescribed: Precautions: Left: Surgical Shoe with Patient Has Alerts: Yes Pressure Relief Insole Pain Present Now: No Electronic Signature(s) Signed: 03/15/2015 3:30:49 PM By: Lorine Bears RCP, RRT, CHT Entered By: Lorine Bears on 03/15/2015 13:14:53 [name] -------------------------------------------------------------------------------- Vitals Details [name] 03/15/2015 1:00 Patient Name: Date of Service: [name] Medical Record Patient Account Number: [id number] Number: Treating RN: Date of Birth/Sex: 09-01-1965 (50 y.o. Male) Other Clinician: [name] Primary Care Physician: [name] Treating Britto, Errol Referring Physician: [name] Physician/Extender: Weeks in Treatment: 9 Vital Signs Time Taken: 12:55 Temperature (F): 98.3 Height (in): 74 Pulse (bpm):  96 Weight (lbs): 288 Respiratory Rate (breaths/min): 18 Body Mass Index (BMI): 37 Blood Pressure (mmHg): 112/68 Capillary Blood Glucose (mg/dl): 148 Reference Range: 80 - 120 mg / dl Electronic Signature(s) Signed: 03/15/2015 3:30:49 PM By: Lorine Bears RCP, RRT, CHT Entered By: Lorine Bears on 03/15/2015 13:15:35

## 2015-03-15 NOTE — Progress Notes (Signed)
[name], [name] ([id number]) Visit Report for 03/15/2015 HBO Details [name], [name] 03/15/2015 1:00 Patient Name: Date of Service: [name] Medical Record Patient Account Number: [id number] Number: Treating RN: Date of Birth/Sex: 1965-09-08 (50 y.o. Male) Other Clinician: [name] Primary Care Physician: [name] Treating Britto, Errol Referring Physician: [name] Physician/Extender: Suella Grove in Treatment: 9 HBO Treatment Course Details Treatment Course Ordering Physician: Christin Fudge 1 Number: HBO Treatment Start Date: 02/01/2015 Total Treatments 30 Ordered: HBO Indication: Diabetic Ulcer(s) of the Lower Extremity HBO Treatment Details Treatment Number: 23 Patient Type: Outpatient Chamber Type: Monoplace Chamber Serial #: [id number] Treatment Protocol: 2.0 ATA with 90 minutes oxygen, and no air breaks Treatment Details Compression Rate Down: 1.5 psi / minute De-Compression Rate Up: 1.5 psi / minute Air breaks and breathing Compress Tx Pressure periods Decompress Decompress Begins Reached (leave unused spaces Begins Ends blank) Chamber Pressure (ATA) 1 2 - - - - - - 2 1 Clock Time (24 hr) 13:10 13:22 - - - - - - 14:54 15:05 Treatment Length: 115 (minutes) Treatment Segments: 4 Capillary Blood Glucose Pre Capillary Blood Glucose (mg/dl): Post Capillary Blood Glucose (mg/dl): Vital Signs Capillary Blood Glucose Reference Range: 80 - 120 mg / dl HBO Diabetic Blood Glucose Intervention Range: <131 mg/dl or >249 mg/dl Time Vitals Blood Respiratory Capillary Blood Glucose Pulse Action Type: Pulse: Temperature: Taken: Pressure: Rate: Glucose (mg/dl): Meter #: Oximetry (%) Taken: Pre 12:55 112/68 96 18 98.3 148 1 none Post 15:09 126/62 84 18 98.2 128 1 none Kantner, Arnulfo J. ([id number]) Treatment Response Treatment Completion Status: Treatment Completed without Adverse Event HBO Attestation I certify that I supervised this HBO  treatment in accordance with Medicare guidelines. A trained Yes emergency response team is readily available per hospital policies and procedures. Continue HBOT as ordered. Yes Electronic Signature(s) Signed: 03/15/2015 4:10:05 PM By: Christin Fudge MD, FACS Previous Signature: 03/15/2015 3:30:49 PM Version By: Lorine Bears RCP, RRT, CHT Entered By: Christin Fudge on 03/15/2015 16:10:05 [name], [name] ([id number]) -------------------------------------------------------------------------------- HBO Safety Checklist Details [name], [name] 03/15/2015 1:00 Patient Name: Date of Service: [name] Medical Record Patient Account Number: [id number] Number: Treating RN: Date of Birth/Sex: 1965-01-17 (50 y.o. Male) Other Clinician: [name] Primary Care Physician: [name] Treating Britto, Errol Referring Physician: [name] Physician/Extender: Suella Grove in Treatment: 9 HBO Safety Checklist Items Safety Checklist Consent Form Signed Patient voided / foley secured and emptied When did you last eato 12:00 pm Last dose of injectable or oral agent 12:00 pm NA Ostomy pouch emptied and vented if applicable NA All implantable devices assessed, documented and approved Intravenous access site secured and place Valuables secured Linens and cotton and cotton/polyester blend (less than 51% polyester) Personal oil-based products / skin lotions / body lotions removed Wigs or hairpieces removed Smoking or tobacco materials removed Books / newspapers / magazines / loose paper removed Cologne, aftershave, perfume and deodorant removed Jewelry removed (may wrap wedding band) Make-up removed Hair care products removed Battery operated devices (external) removed Heating patches and chemical warmers removed NA Titanium eyewear removed NA Nail polish cured greater than 10 hours NA Casting material cured greater than 10 hours NA Hearing aids  removed NA Loose dentures or partials removed NA Prosthetics have been removed Patient demonstrates correct use of air break device (if applicable) Patient concerns have been addressed Patient grounding bracelet on and cord attached to chamber Specifics for Inpatients (complete in addition to above) Medication sheet sent with patient [name], [name] ([id number]) Intravenous  medications needed or due during therapy sent with patient Drainage tubes (e.g. nasogastric tube or chest tube secured and vented) Endotracheal or Tracheotomy tube secured Cuff deflated of air and inflated with saline Airway suctioned Electronic Signature(s) Signed: 03/15/2015 3:30:49 PM By: Lorine Bears RCP, RRT, CHT Entered By: Lorine Bears on 03/15/2015 13:17:39

## 2015-03-16 ENCOUNTER — Encounter: Payer: Managed Care, Other (non HMO) | Admitting: Internal Medicine

## 2015-03-16 ENCOUNTER — Ambulatory Visit (INDEPENDENT_AMBULATORY_CARE_PROVIDER_SITE_OTHER): Payer: Managed Care, Other (non HMO) | Admitting: Sports Medicine

## 2015-03-16 ENCOUNTER — Ambulatory Visit (INDEPENDENT_AMBULATORY_CARE_PROVIDER_SITE_OTHER): Payer: Managed Care, Other (non HMO)

## 2015-03-16 ENCOUNTER — Encounter: Payer: Self-pay | Admitting: Sports Medicine

## 2015-03-16 DIAGNOSIS — Z89421 Acquired absence of other right toe(s): Secondary | ICD-10-CM

## 2015-03-16 DIAGNOSIS — E11621 Type 2 diabetes mellitus with foot ulcer: Secondary | ICD-10-CM | POA: Diagnosis not present

## 2015-03-16 DIAGNOSIS — M79671 Pain in right foot: Secondary | ICD-10-CM

## 2015-03-16 LAB — GLUCOSE, CAPILLARY
Glucose-Capillary: 211 mg/dL — ABNORMAL HIGH (ref 65–99)
Glucose-Capillary: 156 mg/dL — ABNORMAL HIGH (ref 65–99)

## 2015-03-16 NOTE — Progress Notes (Signed)
[name], [name] ([id number]) Visit Report for 03/16/2015 Arrival Information Details [name] Date of Service: 03/16/2015 1:30 PM Patient Name: J. Patient Account Number: [id number] Medical Record Treating RN: [id number] Number: Other Clinician: [name] Date of Birth/Sex: 09-Sep-1965 (50 y.o. Male) Treating [name], [name] Primary Care Physician/Extender: Floyde Parkins Physician: Referring Physician: Sharilyn Sites in Treatment: 9 Visit Information History Since Last Visit Added or deleted any medications: No Patient Arrived: Ambulatory Any new allergies or adverse reactions: No Arrival Time: 12:50 Had a fall or experienced change in No Accompanied By: self activities of daily living that may affect Transfer Assistance: None risk of falls: Patient Identification Verified: Yes Signs or symptoms of abuse/neglect No Secondary Verification Process Yes since last visito Completed: Hospitalized since last visit: No Patient Requires Transmission-Based No Has Dressing in Place as Prescribed: Yes Precautions: Has Footwear/Offloading in Place as Yes Patient Has Alerts: Yes Prescribed: Left: Surgical Shoe with Pressure Relief Insole Pain Present Now: No Electronic Signature(s) Signed: 03/16/2015 3:22:20 PM By: Lorine Bears RCP, RRT, CHT Entered By: Becky Sax, Amado Nash on 03/16/2015 13:10:42 [name] ([id number]) -------------------------------------------------------------------------------- Encounter Discharge Information Details [name] Date of Service: 03/16/2015 1:30 PM Patient Name: J. Patient Account Number: [id number] Medical Record Treating RN: [id number] Number: Other Clinician: [name] Date of Birth/Sex: 11/09/1965 (50 y.o. Male) Treating [name], [name] Primary Care Physician/Extender: Floyde Parkins Physician: Referring Physician: Sharilyn Sites in Treatment:  9 Encounter Discharge Information Items Discharge Pain Level: 0 Discharge Condition: Stable Ambulatory Status: Ambulatory Discharge Destination: Home Private Transportation: Auto Accompanied By: self Schedule Follow-up Appointment: No Medication Reconciliation completed and No provided to Patient/Care Marijo Quizon: Clinical Summary of Care: Notes Patient has an HBO treatment scheduled on 03/17/15 at 13:00 pm. Electronic Signature(s) Signed: 03/16/2015 3:25:00 PM By: Lorine Bears RCP, RRT, CHT Entered By: Becky Sax, Amado Nash on 03/16/2015 15:24:33 [name] ([id number]) -------------------------------------------------------------------------------- Vitals Details [name] Date of Service: 03/16/2015 1:30 PM Patient Name: J. Patient Account Number: [id number] Medical Record Treating RN: [id number] Number: Other Clinician: [name] Date of Birth/Sex: 16-Oct-1965 (50 y.o. Male) Treating [name], [name] Primary Care Physician/Extender: Floyde Parkins Physician: Referring Physician: Sharilyn Sites in Treatment: 9 Vital Signs Time Taken: 12:50 Temperature (F): 98.4 Height (in): 74 Pulse (bpm): 78 Weight (lbs): 288 Respiratory Rate (breaths/min): 18 Body Mass Index (BMI): 37 Blood Pressure (mmHg): 140/78 Capillary Blood Glucose (mg/dl): 211 Reference Range: 80 - 120 mg / dl Electronic Signature(s) Signed: 03/16/2015 3:22:20 PM By: Lorine Bears RCP, RRT, CHT Entered By: Becky Sax, Amado Nash on 03/16/2015 13:11:12

## 2015-03-16 NOTE — Progress Notes (Signed)
Patient ID: MORITZ LEVER, male   DOB: 10/24/1965, 50 y.o.   MRN: [id number]  Subjective: SHONDALE QUINLEY is a 50 y.o. 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 Patient currently on Vanco and Cefipime PICC Line Abx without complication and going to HBO. Patient denies pain at surgical site, denies calf pain, denies headache, chest pain, shortness of breath, nausea, vomiting, fever, or chills. Patient states that he is doing well. No other issues noted.   Patient Active Problem List   Diagnosis Date Noted  . Influenza A 03/09/2015  . Fever, unspecified 03/08/2015  . Nausea and vomiting 03/08/2015  . Hyperlipidemia 03/04/2015  . Osteomyelitis ([place]) 03/04/2015  . Edema leg 02/26/2015  . Pain of right lower leg 02/26/2015  . 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 Diabetes mellitus ([place]) 02/10/2015  . Foot osteomyelitis, right ([place]) 02/04/2015  . Type II diabetes mellitus ([place]) 07/10/2007  . HYPERTENSION 07/10/2007    Current Outpatient Prescriptions on File Prior to Visit  Medication Sig Dispense Refill  . acetaminophen (TYLENOL) 325 MG tablet Take 2 tablets (650 mg total) by mouth every 6 (six) hours as needed for mild pain or headache.    Marland Kitchen aspirin EC 81 MG tablet Take 81 mg by mouth daily.    Marland Kitchen ceFEPIme 2 g in dextrose 5 % 50 mL Inject 2 g into the vein every 8 (eight) hours. Two-week supply then per Dr. Cannon Kettle 42 ampule 0  . fluconazole (DIFLUCAN) 200 MG tablet Take 200 mg by mouth daily.    Marland Kitchen glimepiride (AMARYL) 2 MG tablet Take 2 mg by mouth 2 (two) times daily.    . INVOKANA 300 MG TABS tablet Take 1 tablet (300 mg total) by mouth daily. 30 tablet   . metFORMIN (GLUCOPHAGE) 1000 MG tablet Take 1,000 mg by mouth 2 (two) times daily.    . Multiple Vitamin (MULTIVITAMIN) tablet Take 1 tablet by mouth daily.    . Olmesartan-Amlodipine-HCTZ 40-10-25 MG TABS Take 1 tablet by  mouth daily. Reported on 02/04/2015    . omega-3 acid ethyl esters (LOVAZA) 1 g capsule Take 2 g by mouth daily.    . ondansetron (ZOFRAN) 4 MG tablet Take 1 tablet (4 mg total) by mouth every 6 (six) hours as needed for nausea. 20 tablet 0  . oxyCODONE-acetaminophen (ROXICET) 5-325 MG tablet Take 1 tablet by mouth every 6 (six) hours as needed for severe pain. 30 tablet 0  . traMADol (ULTRAM) 50 MG tablet Take 1 tablet (50 mg total) by mouth every 6 (six) hours as needed for moderate pain. 30 tablet 0  . TRESIBA FLEXTOUCH 200 UNIT/ML SOPN Inject 100 Units into the skin daily.    . vancomycin 1,250 mg in sodium chloride 0.9 % 250 mL Inject 1,250 mg into the vein every 12 (twelve) hours. Per home health RN 28 each 0   No current facility-administered medications on file prior to visit.    No Known Allergies  Objective: There were no vitals filed for this visit.  General: No acute distress, AAOx3  Right foot: Sutures intact at proximal incision with no gapping or dehiscence at surgical site, distally granular ulceration that measures 2.8x1.5x0.2cm with no surrounding infection, mild swelling to right forefoot, no erythema, no warmth, no drainage, no signs of infection noted, Capillary fill time <3 seconds in all remaning digits, gross sensation present via light touch to right foot. No  pain or crepitation with range of motion right foot.  No pain with calf compression.   Post Op Xray, Right foot: partial 5th ray amputation with no acute infection. Soft tissue swelling within normal limits for post op status.   Results for orders placed or performed during the hospital encounter of 03/04/15  Anaerobic culture     Status: None   Collection Time: 03/04/15  7:58 PM  Result Value Ref Range Status   Specimen Description TISSUE RIGHT FOOT  Final   Special Requests 5TH METATARSAL PT ON VANC  Final   Gram Stain   Final    NO WBC SEEN MODERATE GRAM NEGATIVE RODS Performed at [facility]     Culture   Final    PREVOTELLA BIVIA Note: BETA LACTAMASE POSITIVE Performed at [facility]    Report Status 03/11/2015 FINAL  Final  Tissue culture     Status: None   Collection Time: 03/04/15  7:58 PM  Result Value Ref Range Status   Specimen Description TISSUE RIGHT FOOT  Final   Special Requests 5TH METATARSAL PT ON VANC  Final   Gram Stain   Final    NO WBC SEEN MODERATE GRAM NEGATIVE RODS Performed at [facility]    Culture   Final    RARE DIPHTHEROIDS(CORYNEBACTERIUM SPECIES) Note: Standardized susceptibility testing for this organism is not available. Performed at [facility]    Report Status 03/09/2015 FINAL  Final  Anaerobic culture     Status: None   Collection Time: 03/04/15  8:05 PM  Result Value Ref Range Status   Specimen Description TISSUE BONE RIGHT FOOT  Final   Special Requests 5TH METATARSAL PT ON VANC  Final   Gram Stain   Final    RARE WBC PRESENT, PREDOMINANTLY MONONUCLEAR NO ORGANISMS SEEN Performed at [facility]    Culture   Final    NO ANAEROBES ISOLATED Performed at [facility]    Report Status 03/09/2015 FINAL  Final  Tissue culture     Status: None   Collection Time: 03/04/15  8:05 PM  Result Value Ref Range Status   Specimen Description TISSUE BONE RIGHT FOOT  Final   Special Requests 5TH METETARSAL PT ON VANC  Final   Gram Stain   Final    RARE WBC PRESENT, PREDOMINANTLY MONONUCLEAR NO ORGANISMS SEEN Performed at [facility]    Culture   Final    RARE DIPHTHEROIDS(CORYNEBACTERIUM SPECIES) Note: Standardized susceptibility testing for this organism is not available. Performed at [facility]    Report Status 03/09/2015 FINAL  Final  Anaerobic culture     Status: None   Collection Time: 03/04/15  8:10 PM  Result Value Ref Range Status   Specimen Description WOUND RIGHT FOOT  Final   Special Requests SWAB OF 5TH METATARSAL PT ON VANC  Final   Gram Stain   Final     ABUNDANT WBC PRESENT,BOTH PMN AND MONONUCLEAR NO SQUAMOUS EPITHELIAL CELLS SEEN NO ORGANISMS SEEN Performed at [facility]    Culture   Final    NO ANAEROBES ISOLATED Performed at [facility]    Report Status 03/09/2015 FINAL  Final  Wound culture     Status: None   Collection Time: 03/04/15  8:10 PM  Result Value Ref Range Status   Specimen Description WOUND RIGHT FOOT  Final   Special Requests SWAB OF 5TH METATARSAL PT ON VANC  Final   Gram  Stain   Final    ABUNDANT WBC PRESENT,BOTH PMN AND MONONUCLEAR NO SQUAMOUS EPITHELIAL CELLS SEEN NO ORGANISMS SEEN Performed at [facility]    Culture   Final    NO GROWTH 2 DAYS Performed at [facility]    Report Status 03/07/2015 FINAL  Final  Culture, blood (Routine X 2) w Reflex to ID Panel     Status: None   Collection Time: 03/08/15  6:45 PM  Result Value Ref Range Status   Specimen Description BLOOD LEFT ANTECUBITAL  Final   Special Requests BOTTLES DRAWN AEROBIC AND ANAEROBIC 5CC  Final   Culture NO GROWTH 5 DAYS  Final   Report Status 03/13/2015 FINAL  Final  Culture, blood (Routine X 2) w Reflex to ID Panel     Status: None   Collection Time: 03/08/15  6:50 PM  Result Value Ref Range Status   Specimen Description BLOOD LEFT ANTECUBITAL  Final   Special Requests BOTTLES DRAWN AEROBIC AND ANAEROBIC 5CC  Final   Culture NO GROWTH 5 DAYS  Final   Report Status 03/13/2015 FINAL  Final  Urine culture     Status: None   Collection Time: 03/10/15  2:08 PM  Result Value Ref Range Status   Specimen Description URINE, RANDOM  Final   Special Requests NONE  Final   Culture NO GROWTH 1 DAY  Final   Report Status 03/11/2015 FINAL  Final     Assessment and Plan:  Problem List Items Addressed This Visit    None    Visit Diagnoses    Right foot pain    -  Primary    Relevant Orders    DG Foot Complete Right    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HCC)        toe + met on right foot  secondary to OM         -Patient seen and evaluated -Xrays reviewed -Applied dry sterile dressing to surgical site right secured with ACE wrap and stockinet  -Advised patient to make sure to keep dressings clean, dry, and intact to right surgical site, removing the ACE as needed for HBO -Advised patient to continue with post-op shoe on right foot  -Cont with PICC Line IV antibiotics with recommendations are per ID for corynbacterium, Dr. [name]. Anticipate course of 2 weeks for soft tissue coverage   -Advised patient to limit activity to necessity  -Advised patient to elevate as necessary  -Will plan for suture removal at next office visit. In the meantime, patient to call office if any issues or problems arise.   Landis Martins, DPM

## 2015-03-17 ENCOUNTER — Encounter: Payer: Managed Care, Other (non HMO) | Admitting: Internal Medicine

## 2015-03-17 DIAGNOSIS — E11621 Type 2 diabetes mellitus with foot ulcer: Secondary | ICD-10-CM | POA: Diagnosis not present

## 2015-03-17 LAB — GLUCOSE, CAPILLARY
Glucose-Capillary: 210 mg/dL — ABNORMAL HIGH (ref 65–99)
Glucose-Capillary: 121 mg/dL — ABNORMAL HIGH (ref 65–99)

## 2015-03-17 NOTE — Progress Notes (Signed)
JAIDYNN, ESPER ([id number]) Visit Report for 03/17/2015 Arrival Information Details [name] Date of Service: 03/17/2015 1:30 PM Patient Name: J. Patient Account Number: [id number] Medical Record Treating RN: [id number] Number: Other Clinician: [name] Date of Birth/Sex: 01/14/1965 (50 y.o. Male) Treating [name], [name] Primary Care Physician/Extender: Floyde Parkins Physician: Referring Physician: Sharilyn Sites in Treatment: 9 Visit Information History Since Last Visit Added or deleted any medications: No Patient Arrived: Ambulatory Any new allergies or adverse reactions: No Arrival Time: 12:50 Had a fall or experienced change in No Accompanied By: self activities of daily living that may affect Transfer Assistance: None risk of falls: Patient Identification Verified: Yes Hospitalized since last visit: No Secondary Verification Process Yes Has Dressing in Place as Prescribed: Yes Completed: Has Footwear/Offloading in Place as Yes Patient Requires Transmission-Based No Prescribed: Precautions: Left: Surgical Shoe with Patient Has Alerts: Yes Pressure Relief Insole Pain Present Now: No Electronic Signature(s) Signed: 03/17/2015 3:37:56 PM By: Lorine Bears RCP, RRT, CHT Entered By: Lorine Bears on 03/17/2015 13:03:28 [name] ([id number]) -------------------------------------------------------------------------------- Encounter Discharge Information Details [name] Date of Service: 03/17/2015 1:30 PM Patient Name: J. Patient Account Number: [id number] Medical Record Treating RN: [id number] Number: Other Clinician: [name] Date of Birth/Sex: 1965-03-26 (50 y.o. Male) Treating [name], [name] Primary Care Physician/Extender: Floyde Parkins Physician: Referring Physician: Sharilyn Sites in Treatment: 9 Encounter Discharge Information Items Discharge Pain Level:  0 Discharge Condition: Stable Ambulatory Status: Ambulatory Discharge Destination: Home Private Transportation: Auto Accompanied By: self Schedule Follow-up Appointment: No Medication Reconciliation completed and No provided to Patient/Care Caleah Tortorelli: Clinical Summary of Care: Notes Patient has an HBO treatment scheduled on 03/18/15 at 13:00 pm. Electronic Signature(s) Signed: 03/17/2015 3:37:56 PM By: Lorine Bears RCP, RRT, CHT Entered By: Lorine Bears on 03/17/2015 15:21:23 [name] ([id number]) -------------------------------------------------------------------------------- Vitals Details [name] Date of Service: 03/17/2015 1:30 PM Patient Name: J. Patient Account Number: [id number] Medical Record Treating RN: [id number] Number: Other Clinician: [name] Date of Birth/Sex: [birth year] (50 y.o. Male) Treating [name], [name] Primary Care Physician/Extender: Floyde Parkins Physician: Referring Physician: Sharilyn Sites in Treatment: 9 Vital Signs Time Taken: 12:50 Temperature (F): 97.8 Height (in): 74 Pulse (bpm): 78 Weight (lbs): 288 Respiratory Rate (breaths/min): 18 Body Mass Index (BMI): 37 Blood Pressure (mmHg): 110/72 Capillary Blood Glucose (mg/dl): 210 Reference Range: 80 - 120 mg / dl Electronic Signature(s) Signed: 03/17/2015 3:37:56 PM By: Lorine Bears RCP, RRT, CHT Entered By: Becky Sax, Amado Nash on 03/17/2015 13:04:09

## 2015-03-17 NOTE — Progress Notes (Signed)
[name] [name] ([id number]) Visit Report for 03/16/2015 HBO Details [name] [name] Date of Service: 03/16/2015 1:30 PM Patient Name: J. Patient Account Number: [id number] Medical Record Treating RN: [id number] Number: Other Clinician: [name] Date of Birth/Sex: 1965/02/09 (50 y.o. Male) Treating [name], [name] Primary Care Physician/Extender: Floyde Parkins Physician: Referring Physician: Sharilyn Sites in Treatment: 9 HBO Treatment Course Details Treatment Course Ordering Physician: Christin Fudge 1 Number: HBO Treatment Start Date: 02/01/2015 Total Treatments 30 Ordered: HBO Indication: Diabetic Ulcer(s) of the Lower Extremity HBO Treatment Details Treatment Number: 24 Patient Type: Outpatient Chamber Type: Monoplace Chamber Serial #: [id number] Treatment Protocol: 2.0 ATA with 90 minutes oxygen, and no air breaks Treatment Details Compression Rate Down: 1.5 psi / minute De-Compression Rate Up: 1.5 psi / minute Air breaks and breathing Compress Tx Pressure periods Decompress Decompress Begins Reached (leave unused spaces Begins Ends blank) Chamber Pressure (ATA) 1 2 - - - - - - 2 1 Clock Time (24 hr) 13:01 13:13 - - - - - - 14:43 14:53 Treatment Length: 112 (minutes) Treatment Segments: 4 Capillary Blood Glucose Pre Capillary Blood Glucose (mg/dl): Post Capillary Blood Glucose (mg/dl): Vital Signs Capillary Blood Glucose Reference Range: 80 - 120 mg / dl HBO Diabetic Blood Glucose Intervention Range: <131 mg/dl or >249 mg/dl Time Vitals Blood Respiratory Capillary Blood Glucose Pulse Action Type: Pulse: Temperature: Taken: Pressure: Rate: Glucose (mg/dl): Meter #: Oximetry (%) Taken: Pre 12:50 140/78 78 18 98.4 211 1 none Post 14:57 128/60 78 18 98.1 156 1 none [name] [name] J. ([id number]) Treatment Response Treatment Completion Status: Treatment Completed without Adverse Event Physician Notes No concerns with treatment  given HBO Attestation I certify that I supervised this HBO treatment in accordance with Medicare guidelines. A trained Yes emergency response team is readily available per hospital policies and procedures. Continue HBOT as ordered. Yes Electronic Signature(s) Signed: 03/17/2015 8:08:07 AM By: Lorine Bears RCP, RRT, CHT Signed: 03/17/2015 8:13:25 AM By: Linton Ham MD Previous Signature: 03/16/2015 3:25:00 PM Version By: Lorine Bears RCP, RRT, CHT Previous Signature: 03/16/2015 3:22:20 PM Version By: Lorine Bears RCP, RRT, CHT Entered By: Lorine Bears on 03/17/2015 08:07:26 [name] [name] ([id number]) -------------------------------------------------------------------------------- HBO Safety Checklist Details [name] [name] Date of Service: 03/16/2015 1:30 PM Patient Name: J. Patient Account Number: [id number] Medical Record Treating RN: [id number] Number: Other Clinician: [name] Date of Birth/Sex: 1965/04/02 (50 y.o. Male) Treating [name], [name] Primary Care Physician/Extender: Floyde Parkins Physician: Referring Physician: Sharilyn Sites in Treatment: 9 HBO Safety Checklist Items Safety Checklist Consent Form Signed Patient voided / foley secured and emptied When did you last eato 12:00 pm Last dose of injectable or oral agent 12:00 pm NA Ostomy pouch emptied and vented if applicable NA All implantable devices assessed, documented and approved Intravenous access site secured and place Valuables secured Linens and cotton and cotton/polyester blend (less than 51% polyester) Personal oil-based products / skin lotions / body lotions removed Wigs or hairpieces removed Smoking or tobacco materials removed Books / newspapers / magazines / loose paper removed Cologne, aftershave, perfume and deodorant removed Jewelry removed (may wrap wedding band) Make-up removed Hair care products  removed Battery operated devices (external) removed Heating patches and chemical warmers removed NA Titanium eyewear removed NA Nail polish cured greater than 10 hours NA Casting material cured greater than 10 hours NA Hearing aids removed NA Loose dentures or partials removed NA Prosthetics have been removed Patient demonstrates correct use of air break device (if  applicable) Patient concerns have been addressed Patient grounding bracelet on and cord attached to chamber Specifics for Inpatients (complete in addition to above) Medication sheet sent with patient [name] [name] [name] [name] ([id number]) Intravenous medications needed or due during therapy sent with patient Drainage tubes (e.g. nasogastric tube or chest tube secured and vented) Endotracheal or Tracheotomy tube secured Cuff deflated of air and inflated with saline Airway suctioned Electronic Signature(s) Signed: 03/17/2015 8:08:07 AM By: Lorine Bears RCP, RRT, CHT Previous Signature: 03/16/2015 3:22:20 PM Version By: Lorine Bears RCP, RRT, CHT Entered By: Lorine Bears on 03/17/2015 08:07:17

## 2015-03-18 ENCOUNTER — Encounter: Payer: Managed Care, Other (non HMO) | Admitting: Surgery

## 2015-03-18 DIAGNOSIS — E11621 Type 2 diabetes mellitus with foot ulcer: Secondary | ICD-10-CM | POA: Diagnosis not present

## 2015-03-18 LAB — GLUCOSE, CAPILLARY
Glucose-Capillary: 128 mg/dL — ABNORMAL HIGH (ref 65–99)
Glucose-Capillary: 143 mg/dL — ABNORMAL HIGH (ref 65–99)
Glucose-Capillary: 127 mg/dL — ABNORMAL HIGH (ref 65–99)

## 2015-03-18 NOTE — Progress Notes (Signed)
ZEIN, VANDERWILT ([id number]) Visit Report for 03/17/2015 HBO Details [name] Date of Service: 03/17/2015 1:30 PM Patient Name: J. Patient Account Number: [id number] Medical Record Treating RN: [id number] Number: Other Clinician: [name] Date of Birth/Sex: 16-Dec-1965 (50 y.o. Male) Treating [name], [name] Primary Care Physician/Extender: Floyde Parkins Physician: Referring Physician: Sharilyn Sites in Treatment: 9 HBO Treatment Course Details Treatment Course Ordering Physician: Christin Fudge 1 Number: HBO Treatment Start Date: 02/01/2015 Total Treatments 30 Ordered: HBO Indication: Diabetic Ulcer(s) of the Lower Extremity HBO Treatment Details Treatment Number: 25 Patient Type: Outpatient Chamber Type: Monoplace Chamber Serial #: [id number] Treatment Protocol: 2.0 ATA with 90 minutes oxygen, and no air breaks Treatment Details Compression Rate Down: 1.5 psi / minute De-Compression Rate Up: 1.5 psi / minute Air breaks and breathing Compress Tx Pressure periods Decompress Decompress Begins Reached (leave unused spaces Begins Ends blank) Chamber Pressure (ATA) 1 2 - - - - - - 2 1 Clock Time (24 hr) 13:00 13:12 - - - - - - 14:42 14:52 Treatment Length: 112 (minutes) Treatment Segments: 4 Capillary Blood Glucose Pre Capillary Blood Glucose (mg/dl): Post Capillary Blood Glucose (mg/dl): Vital Signs Capillary Blood Glucose Reference Range: 80 - 120 mg / dl HBO Diabetic Blood Glucose Intervention Range: <131 mg/dl or >249 mg/dl Time Vitals Blood Respiratory Capillary Blood Glucose Pulse Action Type: Pulse: Temperature: Taken: Pressure: Rate: Glucose (mg/dl): Meter #: Oximetry (%) Taken: Pre 12:50 110/72 78 18 97.8 210 1 none Post 14:56 121 1 none Spratt, Breaker J. ([id number]) Treatment Response Treatment Completion Status: Treatment Completed without Adverse Event Physician Notes No concerns with treatment given HBO Attestation I  certify that I supervised this HBO treatment in accordance with Medicare guidelines. A trained Yes emergency response team is readily available per hospital policies and procedures. Continue HBOT as ordered. Yes Electronic Signature(s) Signed: 03/17/2015 4:45:15 PM By: Linton Ham MD Previous Signature: 03/17/2015 3:37:56 PM Version By: Becky Sax, Sallie RCP, RRT, CHT Entered By: Linton Ham on 03/17/2015 16:39:10 [name] ([id number]) -------------------------------------------------------------------------------- HBO Safety Checklist Details [name] Date of Service: 03/17/2015 1:30 PM Patient Name: J. Patient Account Number: [id number] Medical Record Treating RN: [id number] Number: Other Clinician: [name] Date of Birth/Sex: 07/11/1965 (50 y.o. Male) Treating [name], [name] Primary Care Physician/Extender: Floyde Parkins Physician: Referring Physician: Sharilyn Sites in Treatment: 9 HBO Safety Checklist Items Safety Checklist Consent Form Signed Patient voided / foley secured and emptied When did you last eato 11:00 am Last dose of injectable or oral agent 11:00 am NA Ostomy pouch emptied and vented if applicable NA All implantable devices assessed, documented and approved Intravenous access site secured and place Valuables secured Linens and cotton and cotton/polyester blend (less than 51% polyester) Personal oil-based products / skin lotions / body lotions removed Wigs or hairpieces removed Smoking or tobacco materials removed Books / newspapers / magazines / loose paper removed Cologne, aftershave, perfume and deodorant removed Jewelry removed (may wrap wedding band) Make-up removed Hair care products removed Battery operated devices (external) removed Heating patches and chemical warmers removed NA Titanium eyewear removed NA Nail polish cured greater than 10 hours NA Casting material cured greater than  10 hours NA Hearing aids removed NA Loose dentures or partials removed NA Prosthetics have been removed Patient demonstrates correct use of air break device (if applicable) Patient concerns have been addressed Patient grounding bracelet on and cord attached to chamber Specifics for Inpatients (complete in addition to above) Medication sheet sent with patient Stanger,  NAME SEESE ([id number]) Intravenous medications needed or due during therapy sent with patient Drainage tubes (e.g. nasogastric tube or chest tube secured and vented) Endotracheal or Tracheotomy tube secured Cuff deflated of air and inflated with saline Airway suctioned Electronic Signature(s) Signed: 03/17/2015 3:37:56 PM By: Lorine Bears RCP, RRT, CHT Entered By: Lorine Bears on 03/17/2015 13:04:51

## 2015-03-19 ENCOUNTER — Encounter: Payer: Managed Care, Other (non HMO) | Admitting: Surgery

## 2015-03-19 DIAGNOSIS — E11621 Type 2 diabetes mellitus with foot ulcer: Secondary | ICD-10-CM | POA: Diagnosis not present

## 2015-03-19 LAB — GLUCOSE, CAPILLARY
Glucose-Capillary: 155 mg/dL — ABNORMAL HIGH (ref 65–99)
Glucose-Capillary: 141 mg/dL — ABNORMAL HIGH (ref 65–99)

## 2015-03-19 NOTE — Progress Notes (Signed)
[name], [name] ([id number]) Visit Report for 03/18/2015 Arrival Information Details [name], [name] 03/18/2015 1:30 Patient Name: Date of Service: [name] Medical Record Patient Account Number: [id number] Number: Treating RN: Date of Birth/Sex: 02-16-1965 (51 y.o. Male) Other Clinician: [name] Primary Care Physician: [name] Treating Christin Fudge Referring Physician: [name] Physician/Extender: Suella Grove in Treatment: 10 Visit Information History Since Last Visit Added or deleted any medications: No Patient Arrived: Ambulatory Any new allergies or adverse reactions: No Arrival Time: 12:48 Had a fall or experienced change in No Accompanied By: self activities of daily living that may affect Transfer Assistance: None risk of falls: Patient Identification Verified: Yes Signs or symptoms of abuse/neglect No Secondary Verification Process Yes since last visito Completed: Has Dressing in Place as Prescribed: Yes Patient Requires Transmission-Based No Has Footwear/Offloading in Place as Yes Precautions: Prescribed: Patient Has Alerts: Yes Left: Surgical Shoe with Pressure Relief Insole Pain Present Now: No Electronic Signature(s) Signed: 03/18/2015 3:37:05 PM By: Lorine Bears RCP, RRT, CHT Entered By: Lorine Bears on 03/18/2015 13:17:16 [name] ([id number]) -------------------------------------------------------------------------------- Encounter Discharge Information Details [name], [name] 03/18/2015 1:30 Patient Name: Date of Service: [name] Medical Record Patient Account Number: [id number] Number: Treating RN: Date of Birth/Sex: [birth year] (50 y.o. Male) Other Clinician: [name] Primary Care Physician: [name] Treating Christin Fudge Referring Physician: [name] Physician/Extender: Suella Grove in Treatment: 10 Encounter Discharge Information  Items Discharge Pain Level: 0 Discharge Condition: Stable Ambulatory Status: Ambulatory Discharge Destination: Home Private Transportation: Auto Accompanied By: self Schedule Follow-up Appointment: No Medication Reconciliation completed and No provided to Patient/Care Shyne Lehrke: Clinical Summary of Care: Notes Patient has an HBO treatment scheduled on 03/19/15 at 13:30 pm. Electronic Signature(s) Signed: 03/18/2015 3:37:05 PM By: Lorine Bears RCP, RRT, CHT Entered By: Lorine Bears on 03/18/2015 15:36:46 [name] ([id number]) -------------------------------------------------------------------------------- Vitals Details [name], [name] 03/18/2015 1:30 Patient Name: Date of Service: [name] Medical Record Patient Account Number: [id number] Number: Treating RN: Date of Birth/Sex: 03-Feb-1965 (50 y.o. Male) Other Clinician: [name] Primary Care Physician: [name] Treating Britto, Errol Referring Physician: [name] Physician/Extender: Suella Grove in Treatment: 10 Vital Signs Time Taken: 12:49 Temperature (F): 97.6 Height (in): 74 Pulse (bpm): 96 Weight (lbs): 288 Respiratory Rate (breaths/min): 18 Body Mass Index (BMI): 37 Blood Pressure (mmHg): 126/70 Capillary Blood Glucose (mg/dl): 128 Reference Range: 80 - 120 mg / dl Electronic Signature(s) Signed: 03/18/2015 3:37:05 PM By: Lorine Bears RCP, RRT, CHT Entered By: Lorine Bears on 03/18/2015 13:17:46

## 2015-03-19 NOTE — Progress Notes (Addendum)
[name], [name] ([id number]) Visit Report for 03/19/2015 HPI Details [name], [name] 03/19/2015 12:45 Patient Name: Date of Service: [name] Medical Record Patient Account Number: [id number] Number: Treating RN: Ahmed Prima Date of Birth/Sex: 03/22/1965 (50 y.o. Male) Other Clinician: Primary Care Physician: [name] Treating Christin Fudge Referring Physician: [name] Physician/Extender: Weeks in Treatment: 10 History of Present Illness Location: plantar aspect of the right forefoot Quality: Patient reports No Pain. Severity: Patient states wound are getting worse. Duration: Patient has had the wound for < 4 weeks prior to presenting for treatment Timing: he has minimal discharge from the wound Context: The wound appeared gradually over time Modifying Factors: Other treatment(s) tried include:plan local care but not offloading Associated Signs and Symptoms: Patient reports having:no pain or discharge from the wound. HPI Description: This 50 year old male comes with an ulcerated area on the plantar aspect of the right foot which she's had for approximately a month. I have known him from a previous visit at [facility] wound center and was treated in the months of April and May 2016 and rapidly healed a left plantar ulcer with a total contact cast. He has been a diabetic for about 16 years and tries to keep active and is fairly compliant with his diabetes management. 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 Past medical history significant for hypertension, hyperlipidemia, and status post appendectomy 1993. He does not smoke or drink alcohol. 01/14/2015 -- the patient had tolerated his total contact cast very well and had no problems and has had no systemic symptoms. However when his total contact cast was cut open he had excessive amount of purulent drainage in spite of being on antibiotics. He had had a recent x-ray done in the ER 12/22/2014  which showed IMPRESSION:No evidence of osseous erosion. Known soft tissue ulceration is not well characterized on radiograph. Scattered vascular calcifications seen. his last hemoglobin A1c in December was 7.3. He has been on Augmentin and doxycycline for the last 2 weeks. 01/21/2015 -- his culture grew rare growth of Pantoea species an MR moderate growth of Candida parapsilosis. it is sensitive to levofloxacin. He has not heard back from the insurance company regarding his hyperbaric oxygen therapy. His MRI has not been done yet and we will try and get him an earlier date 01/28/2015 -- MRI was done last night -- IMPRESSION:1. Soft tissue ulcer overlying the plantar aspect of the fifth metatarsal head extending to the cortex. Subcortical marrow edema in the fifth metatarsal head with corresponding T1 hypointensity is [name], [name]. ([id number]) concerning for early osteomyelitis of the plantar lateral aspect of the fifth metatarsal head. Chest x-ray done on 01/14/2015 shows bronchiectatic changes without infiltrate. EKG done on generally 17 2017 shows a normal sinus rhythm and is a normal EKG. 02/04/2015 -- he was asked to see Dr. [name] last week and had 2 appointments but had to cancel both due to pressures of work. Last night he has woken up with severe pain in the foot and leg and it is swollen up. No fever or no change in his blood glucose. Addendum: I spoke with Dr. [name] who kindly agreed to accept the patient for inpatient therapy and have also opened to the hospitalist Dr. [name], and discuss details of the management including PICC line and repeat cultures. 02/12/2015-- -- was seen by Dr. [name] in the hospital and a PICC line was placed. He was to receive Ceftazidime 2 g every 12 hourly, oral levofloxacin 750 mg every 24 hourly and  oral fluconazole 200 mg daily. The antibiotics were to be given for 4 weeks except the Diflucan was to be given for the first 2  weeks. Reviewed note from 02/10/2015 -- and Dr. [name] had recommended management for growth of MSSA and Serratia. He switched him from ceftazidime to ceftriaxone 2 g every 24 hours. Levofloxacin was stopped and he would continue on fluconazole for another week. He had asked me to decide whether further imaging was necessary and whether surgical debridement of the infected bone was needed. He is doing well and has been off work for this week and we will keep him off the next week. 02/22/2015 -- he was seen by my colleague on 01/19/2015 and at that time an incision and drainage was done on his right lateral forefoot on the dorsum. Today when I probed this wound it is frankly draining pus and it communicates with the ulcer on the plantar aspect of his right foot. The patient is still on IV ceftriaxone 2 g every 24 hours and is to be seen by Dr. [name] on Friday. 03/19/2015 -- On 03/04/2015 I spoke to Dr. 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 He recommended operative debridement which would probably result in the fifth metatarsal head and toe amputation.The patient would be referred back to [place] once he was done with surgery. He was admitted to [facility] yesterday and had surgery done by podiatry for a right fifth metatarsal acute osteomyelitis with cellulitis and abscess. He had a right foot incision and drainage with fifth metatarsal partial amputation and removal of toe infected bone and soft tissue with cultures. The wound was partially closed and packing of the distal end was done. Patient was already on cefepime 2 g IV every 8 hourly and put on vancomycin pending final cultures. He had grown moderate gram-negative rods, and later found to be rare diphtheroids. I received a call from Dr. Cannon Kettle the podiatrist and we  discussed the above. On 03/10/2015 he was found positive for influenza a and has been put on Tamiflu. Since his discharge he has been seen by Dr. Cannon Kettle who is planning to remove his sutures this coming week. He was reviewed by Dr. [name] on 03/17/2015 and his Diflucan was stopped and vancomycin. After the 3 doses he is taking. He is going to change Ceftazidime to Zosyn 3.375 g IV every 8 hours. Electronic Signature(s) Signed: 03/19/2015 1:28:24 PM By: Christin Fudge MD, FACS Previous Signature: 03/19/2015 12:56:55 PM Version By: Christin Fudge MD, FACS Entered By: Christin Fudge on 03/19/2015 13:28:24 [name] ([id number]) -------------------------------------------------------------------------------- Physical Exam Details [name], [name] 03/19/2015 12:45 Patient Name: Date of Service: [name] Medical Record Patient Account Number: [id number] Number: Treating RN: Ahmed Prima Date of Birth/Sex: [birth year] (50 y.o. Male) Other Clinician: Primary Care Physician: [name] Treating Christin Fudge Referring Physician: [name] Physician/Extender: Weeks in Treatment: 10 Constitutional . Pulse regular. Respirations normal and unlabored. Afebrile. . Eyes Nonicteric. Reactive to light. Ears, Nose, Mouth, and Throat Lips, teeth, and gums WNL.Marland Kitchen Moist mucosa without lesions. Neck supple and nontender. No palpable supraclavicular or cervical adenopathy. Normal sized without goiter. Respiratory WNL. No retractions.. Cardiovascular Pedal Pulses WNL. No clubbing, cyanosis or edema. Gastrointestinal (GI) Abdomen without masses or tenderness.. No liver or spleen enlargement or tenderness.. Genitourinary (GU) No hydrocele,  spermatocele, tenderness of the cord, or testicular mass.Marland Kitchen Penis without lesions.Lowella Fairy without lesions. No cystocele, or rectocele. Pelvic support intact, no discharge.Marland Kitchen Urethra without masses, tenderness or  scarring.Marland Kitchen Lymphatic No adneopathy. No adenopathy. No adenopathy. Musculoskeletal Adexa without tenderness or enlargement.. Digits and nails w/o clubbing, cyanosis, infection, petechiae, ischemia, or inflammatory conditions.. Integumentary (Hair, Skin) No suspicious lesions. No crepitus or fluctuance. No peri-wound warmth or erythema. No masses.Marland Kitchen Psychiatric Judgement and insight Intact.. No evidence of depression, anxiety, or agitation.. Notes after his surgery he has got some sutures on the lateral aspect of his right forefoot and there is a small open area on the distal forefoot in the region where his previous fifth toe was. The wound on the plantar aspect is completely healed. Electronic Signature(s) [name], [name] ([id number]) Signed: 03/19/2015 1:29:46 PM By: Christin Fudge MD, FACS Entered By: Christin Fudge on 03/19/2015 13:29:46 [name], [name] ([id number]) -------------------------------------------------------------------------------- Physician Orders Details [name], YAHOLA 03/19/2015 12:45 Patient Name: Date of Service: [name] Medical Record Patient Account Number: [id number] Number: Treating RN: Ahmed Prima Date of Birth/Sex: 1965-05-27 (50 y.o. Male) Other Clinician: Primary Care Physician: [name] Treating Christin Fudge Referring Physician: [name] Physician/Extender: Suella Grove in Treatment: 10 Verbal / Phone Orders: Yes ClinicianCarolyne Fiscal, Debi Read Back and Verified: Yes Diagnosis Coding Wound Cleansing Wound #2 Right,Lateral Metatarsal head fifth o Clean wound with Normal Saline. Anesthetic Wound #2 Right,Lateral Metatarsal head fifth o Topical Lidocaine 4% cream applied to wound bed prior to debridement Primary Wound Dressing Wound #2 Right,Lateral Metatarsal head fifth o Prisma Ag - reconstitute with normal saline Secondary Dressing Wound #2 Right,Lateral Metatarsal head fifth o Gauze, ABD and  Kerlix/Conform Dressing Change Frequency Wound #2 Right,Lateral Metatarsal head fifth o Change dressing every other day. Follow-up Appointments Wound #2 Right,Lateral Metatarsal head fifth o Return Appointment in 1 week. Off-Loading Wound #2 Right,Lateral Metatarsal head fifth o Open toe surgical shoe with peg assist. - front offload Medications-please add to medication list. Wound #2 Right,Lateral Metatarsal head fifth o Other: - continue IV antibiotics [name], [name] ([id number]) Electronic Signature(s) Signed: 03/19/2015 3:06:35 PM By: Alric Quan Signed: 03/19/2015 3:37:46 PM By: Christin Fudge MD, FACS Entered By: Alric Quan on 03/19/2015 13:07:45 [name] ([id number]) -------------------------------------------------------------------------------- Progress Note Details [name], [name] 03/19/2015 12:45 Patient Name: Date of Service: [name] Medical Record Patient Account Number: [id number] Number: Treating RN: Ahmed Prima Date of Birth/Sex: Nov 04, 1965 (50 y.o. Male) Other Clinician: Primary Care Physician: [name] Treating Christin Fudge Referring Physician: [name] Physician/Extender: Weeks in Treatment: 10 Subjective History of Present Illness (HPI) The following HPI elements were documented for the patient's wound: Location: plantar aspect of the right forefoot Quality: Patient reports No Pain. Severity: Patient states wound are getting worse. Duration: Patient has had the wound for < 4 weeks prior to presenting for treatment Timing: he has minimal discharge from the wound Context: The wound appeared gradually over time Modifying Factors: Other treatment(s) tried include:plan local care but not offloading Associated Signs and Symptoms: Patient reports having:no pain or discharge from the wound. This 49 year old male comes with an ulcerated area on the plantar aspect of the right foot which she's  had for approximately a month. I have known him from a previous visit at [facility] wound center and was treated in the months of April and May 2016 and rapidly healed a left plantar ulcer with a total contact cast. He has been a diabetic for about 16 years and tries to keep active  and is fairly compliant with his diabetes management. 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 Past medical history significant for hypertension, hyperlipidemia, and status post appendectomy 1993. He does not smoke or drink alcohol. 01/14/2015 -- the patient had tolerated his total contact cast very well and had no problems and has had no systemic symptoms. However when his total contact cast was cut open he had excessive amount of purulent drainage in spite of being on antibiotics. He had had a recent x-ray done in the ER 12/22/2014 which showed IMPRESSION:No evidence of osseous erosion. Known soft tissue ulceration is not well characterized on radiograph. Scattered vascular calcifications seen. his last hemoglobin A1c in December was 7.3. He has been on Augmentin and doxycycline for the last 2 weeks. 01/21/2015 -- his culture grew rare growth of Pantoea species an MR moderate growth of Candida parapsilosis. it is sensitive to levofloxacin. He has not heard back from the insurance company regarding his hyperbaric oxygen therapy. His MRI has not been done yet and we will try and get him an earlier date 01/28/2015 -- MRI was done last night -- IMPRESSION:1. Soft tissue ulcer overlying the plantar aspect of the fifth metatarsal head extending to the cortex. Subcortical marrow edema in the fifth metatarsal head with corresponding T1 hypointensity is JAHI, RISTER. ([id number]) concerning for early osteomyelitis of the plantar lateral aspect of the fifth metatarsal head. Chest x-ray done on 01/14/2015 shows bronchiectatic changes without infiltrate. EKG done on generally 17 2017 shows a normal sinus rhythm  and is a normal EKG. 02/04/2015 -- he was asked to see Dr. [name] last week and had 2 appointments but had to cancel both due to pressures of work. Last night he has woken up with severe pain in the foot and leg and it is swollen up. No fever or no change in his blood glucose. Addendum: I spoke with Dr. [name] who kindly agreed to accept the patient for inpatient therapy and have also opened to the hospitalist Dr. [name], and discuss details of the management including PICC line and repeat cultures. 02/12/2015-- -- was seen by Dr. [name] in the hospital and a PICC line was placed. He was to receive Ceftazidime 2 g every 12 hourly, oral levofloxacin 750 mg every 24 hourly and oral fluconazole 200 mg daily. The antibiotics were to be given for 4 weeks except the Diflucan was to be given for the first 2 weeks. Reviewed note from 02/10/2015 -- and Dr. [name] had recommended management for growth of MSSA and Serratia. He switched him from ceftazidime to ceftriaxone 2 g every 24 hours. Levofloxacin was stopped and he would continue on fluconazole for another week. He had asked me to decide whether further imaging was necessary and whether surgical debridement of the infected bone was needed. He is doing well and has been off work for this week and we will keep him off the next week. 02/22/2015 -- he was seen by my colleague on 01/19/2015 and at that time an incision and drainage was done on his right lateral forefoot on the dorsum. Today when I probed this wound it is frankly draining pus and it communicates with the ulcer on the plantar aspect of his right foot. The patient is still on IV ceftriaxone 2 g every 24 hours and is to be seen by Dr. [name] on Friday. 03/19/2015 -- On 03/04/2015 I spoke to Dr. Celesta Gentile who saw him in the office today and did an x-ray of his right foot and  noted that there was osteomyelitis of the right fifth toe and metatarsal and a lot of pus  draining from the wound. He recommended operative debridement which would probably result in the fifth metatarsal head and toe amputation.The patient would be referred back to [place] once he was done with surgery. He was admitted to [facility] yesterday and had surgery done by podiatry for a right fifth metatarsal acute osteomyelitis with cellulitis and abscess. He had a right foot incision and drainage with fifth metatarsal partial amputation and removal of toe infected bone and soft tissue with cultures. The wound was partially closed and packing of the distal end was done. Patient was already on cefepime 2 g IV every 8 hourly and put on vancomycin pending final cultures. He had grown moderate gram-negative rods, and later found to be rare diphtheroids. I received a call from Dr. Cannon Kettle the podiatrist and we discussed the above. On 03/10/2015 he was found positive for influenza a and has been put on Tamiflu. Since his discharge he has been seen by Dr. Cannon Kettle who is planning to remove his sutures this coming week. He was reviewed by Dr. [name] on 03/17/2015 and his Diflucan was stopped and vancomycin. After the 3 doses he is taking. He is going to change Ceftazidime to Zosyn 3.375 g IV every 8 hours. Objective Constitutional [name], [name] ([id number]) Pulse regular. Respirations normal and unlabored. Afebrile. Vitals Time Taken: 12:45 PM, Height: 74 in, Weight: 288 lbs, BMI: 37, Temperature: 97.5 F, Pulse: 101 bpm, Respiratory Rate: 18 breaths/min, Blood Pressure: 141/66 mmHg. Eyes Nonicteric. Reactive to light. Ears, Nose, Mouth, and Throat Lips, teeth, and gums WNL.Marland Kitchen Moist mucosa without lesions. Neck supple and nontender. No palpable supraclavicular or cervical adenopathy. Normal sized without goiter. Respiratory WNL. No retractions.. Cardiovascular Pedal Pulses WNL. No clubbing, cyanosis or edema. Gastrointestinal (GI) Abdomen without masses or tenderness.. No  liver or spleen enlargement or tenderness.. Genitourinary (GU) No hydrocele, spermatocele, tenderness of the cord, or testicular mass.Marland Kitchen Penis without lesions.Lowella Fairy without lesions. No cystocele, or rectocele. Pelvic support intact, no discharge.Marland Kitchen Urethra without masses, tenderness or scarring.Marland Kitchen Lymphatic No adneopathy. No adenopathy. No adenopathy. Musculoskeletal Adexa without tenderness or enlargement.. Digits and nails w/o clubbing, cyanosis, infection, petechiae, ischemia, or inflammatory conditions.Marland Kitchen Psychiatric Judgement and insight Intact.. No evidence of depression, anxiety, or agitation.. General Notes: after his surgery he has got some sutures on the lateral aspect of his right forefoot and there is a small open area on the distal forefoot in the region where his previous fifth toe was. The wound on the plantar aspect is completely healed. Integumentary (Hair, Skin) No suspicious lesions. No crepitus or fluctuance. No peri-wound warmth or erythema. No masses.. Wound #1 status is Open. Original cause of wound was Gradually Appeared. The wound is located on the [place]. The wound measures 0cm length x 0cm width x 0cm depth; 0cm^2 area and 0cm^3 volume. The wound is limited to skin breakdown. There is a none present amount of drainage noted. The wound margin is distinct with the outline attached to the wound base. There is no granulation within the [name], [name]. ([id number]) wound bed. There is no necrotic tissue within the wound bed. The periwound skin appearance exhibited: Callus, Dry/Scaly. The periwound skin appearance did not exhibit: Crepitus, Excoriation, Fluctuance, Friable, Induration, Localized Edema, Rash, Scarring, Maceration, Moist, Atrophie Blanche, Cyanosis, Ecchymosis, Hemosiderin Staining, Mottled, Pallor, Rubor, Erythema. Periwound temperature was noted as No Abnormality. Wound #2 status is Open. Original cause of  wound was Bump. The wound  is located on the Right,Lateral Metatarsal head fifth. The wound measures 2.5cm length x 1cm width x 0.2cm depth; 1.963cm^2 area and 0.393cm^3 volume. The wound is limited to skin breakdown. There is a large amount of serosanguineous drainage noted. The wound margin is flat and intact. There is large (67-100%) red granulation within the wound bed. There is a small (1-33%) amount of necrotic tissue within the wound bed including Adherent Slough. The periwound skin appearance exhibited: Moist, Erythema. The periwound skin appearance did not exhibit: Callus, Crepitus, Excoriation, Fluctuance, Friable, Induration, Localized Edema, Rash, Scarring, Dry/Scaly, Maceration, Atrophie Blanche, Cyanosis, Ecchymosis, Hemosiderin Staining, Mottled, Pallor, Rubor. The surrounding wound skin color is noted with erythema which is circumferential. Periwound temperature was noted as Hot. The periwound has tenderness on palpation. General Notes: 6 sutures [address] place Assessment after reviewing his recent operative report, postoperative culture reports and his infectious diseases report, for this Wagner grade 3 diabetic foot ulcer I'm going to recommend: 1. Continuing with hyperbaric oxygen therapy for a another 30 settings as it will go a long way in healing his wound and keeping the flaps together. 2. Continue with IV antibiotics as per Dr. [name] and at the present time it'll be Zosyn every 8 hours 3. Prisma AG to be applied to the wound and changed every other day 4. Regular visits at the wound center and close monitoring of his diabetes The patient understands that Dr. [name] his ID specialist and I both feel very strongly that he should continue with hyperbaric oxygen therapy along with his IV antibiotics now that the surgical debridement has been done and the patient is agreeable. Plan Wound Cleansing: Wound #2 Right,Lateral Metatarsal head fifth: Clean wound with Normal Saline. Anesthetic: Wound  #2 Right,Lateral Metatarsal head fifth: Topical Lidocaine 4% cream applied to wound bed prior to debridement Primary Wound Dressing: Wound #2 Right,Lateral Metatarsal head fifth: Prisma Ag - reconstitute with normal saline [name], [name] ([id number]) Secondary Dressing: Wound #2 Right,Lateral Metatarsal head fifth: Gauze, ABD and Kerlix/Conform Dressing Change Frequency: Wound #2 Right,Lateral Metatarsal head fifth: Change dressing every other day. Follow-up Appointments: Wound #2 Right,Lateral Metatarsal head fifth: Return Appointment in 1 week. Off-Loading: Wound #2 Right,Lateral Metatarsal head fifth: Open toe surgical shoe with peg assist. - front offload Medications-please add to medication list.: Wound #2 Right,Lateral Metatarsal head fifth: Other: - continue IV antibiotics after reviewing his recent operative report, postoperative culture reports and his infectious diseases report, for this Wagner grade 3 diabetic foot ulcer I'm going to recommend: 1. Continuing with hyperbaric oxygen therapy for a another 30 settings as it will go a long way in healing his wound and keeping the flaps together. 2. Continue with IV antibiotics as per Dr. [name] and at the present time it'll be Zosyn every 8 hours 3. Prisma AG to be applied to the wound and changed every other day 4. Regular visits at the wound center and close monitoring of his diabetes The patient understands that Dr. [name] his ID specialist and I both feel very strongly that he should continue with hyperbaric oxygen therapy along with his IV antibiotics now that the surgical debridement has been done and the patient is agreeable. Electronic Signature(s) Signed: 03/19/2015 1:31:55 PM By: Christin Fudge MD, FACS Entered By: Christin Fudge on 03/19/2015 13:31:55 [name] ([id number]) -------------------------------------------------------------------------------- SuperBill Details Patient Name:  [name] Date of Service: 03/19/2015 Medical Record Number: [id number] Patient Account Number: [id number] Date of Birth/Sex: Jun 02, 1965 (50  y.o. Male) Treating RN: Carolyne Fiscal, Debi Primary Care Physician: [name] Other Clinician: Referring Physician: [name] Treating Physician/Extender: Frann Rider in Treatment: 10 Diagnosis Coding ICD-10 Codes Code Description E11.621 Type 2 diabetes mellitus with foot ulcer L97.512 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d L84 Corns and callosities L02.611 Cutaneous abscess of right foot M86.371 Chronic multifocal osteomyelitis, right ankle and foot Physician Procedures CPT4 Code Description: [id number] - WC PHYS LEVEL 4 - EST PT ICD-10 Description Diagnosis E11.621 Type 2 diabetes mellitus with foot ulcer 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

## 2015-03-19 NOTE — Progress Notes (Signed)
CINSERE, DAMBOISE ([id number]) Visit Report for 03/18/2015 HBO Details AKILLES, GEIB 03/18/2015 1:30 Patient Name: Date of Service: [name] Medical Record Patient Account Number: [id number] Number: Treating RN: Date of Birth/Sex: 1965/07/03 (50 y.o. Male) Other Clinician: [name] Primary Care Physician: [name] Treating Britto, Errol Referring Physician: [name] Physician/Extender: Suella Grove in Treatment: 10 HBO Treatment Course Details Treatment Course Ordering Physician: Christin Fudge 1 Number: HBO Treatment Start Date: 02/01/2015 Total Treatments 30 Ordered: HBO Indication: Diabetic Ulcer(s) of the Lower Extremity HBO Treatment Details Treatment Number: 26 Patient Type: Outpatient Chamber Type: Monoplace Chamber Serial #: [id number] Treatment Protocol: 2.0 ATA with 90 minutes oxygen, and no air breaks Treatment Details Compression Rate Down: 1.5 psi / minute De-Compression Rate Up: 1.5 psi / minute Air breaks and breathing Compress Tx Pressure periods Decompress Decompress Begins Reached (leave unused spaces Begins Ends blank) Chamber Pressure (ATA) 1 2 - - - - - - 2 1 Clock Time (24 hr) 13:23 13:35 - - - - - - 15:05 15:15 Treatment Length: 112 (minutes) Treatment Segments: 4 Capillary Blood Glucose Pre Capillary Blood Glucose (mg/dl): Post Capillary Blood Glucose (mg/dl): Vital Signs Capillary Blood Glucose Reference Range: 80 - 120 mg / dl HBO Diabetic Blood Glucose Intervention Range: <131 mg/dl or >249 mg/dl Time Vitals Blood Respiratory Capillary Blood Glucose Pulse Action Type: Pulse: Temperature: Taken: Pressure: Rate: Glucose (mg/dl): Meter #: Oximetry (%) Taken: Pre 12:49 126/70 96 18 97.6 128 1 Gave Ensure per protocol Pre 13:20 143 1 proceed with HBO per protocol Post 15:19 132/62 78 18 98.4 127 1 none Kanady, Salik J. ([id number]) Treatment Response Treatment Completion Status: Treatment Completed without  Adverse Event HBO Attestation I certify that I supervised this HBO treatment in accordance with Medicare guidelines. A trained Yes emergency response team is readily available per hospital policies and procedures. Continue HBOT as ordered. Yes Electronic Signature(s) Signed: 03/18/2015 4:03:59 [name] By: Christin Fudge MD, FACS Previous Signature: 03/18/2015 3:37:05 [name] Version By: Lorine Bears RCP, RRT, CHT Entered By: Christin Fudge on 03/18/2015 16:03:58 LEONCIO, KERL ([id number]) -------------------------------------------------------------------------------- HBO Safety Checklist Details OCEAN, STEENSMA 03/18/2015 1:30 Patient Name: Date of Service: [name] Medical Record Patient Account Number: [id number] Number: Treating RN: Date of Birth/Sex: [birth year] (50 y.o. Male) Other Clinician: [name] Primary Care Physician: [name] Treating Britto, Errol Referring Physician: [name] Physician/Extender: Suella Grove in Treatment: 10 HBO Safety Checklist Items Safety Checklist Consent Form Signed Patient voided / foley secured and emptied When did you last eato 12:00 [name] Last dose of injectable or oral agent 12:00 [name] NA Ostomy pouch emptied and vented if applicable NA All implantable devices assessed, documented and approved Intravenous access site secured and place Valuables secured Linens and cotton and cotton/polyester blend (less than 51% polyester) Personal oil-based products / skin lotions / body lotions removed Wigs or hairpieces removed Smoking or tobacco materials removed Books / newspapers / magazines / loose paper removed Cologne, aftershave, perfume and deodorant removed Jewelry removed (may wrap wedding band) Make-up removed Hair care products removed Battery operated devices (external) removed Heating patches and chemical warmers removed NA Titanium eyewear removed NA Nail polish cured greater than 10 hours NA  Casting material cured greater than 10 hours NA Hearing aids removed NA Loose dentures or partials removed NA Prosthetics have been removed Patient demonstrates correct use of air break device (if applicable) Patient concerns have been addressed Patient grounding bracelet on and cord attached to chamber Specifics for Inpatients (complete in addition  to above) Medication sheet sent with patient RAFER, KAPRAL ([id number]) Intravenous medications needed or due during therapy sent with patient Drainage tubes (e.g. nasogastric tube or chest tube secured and vented) Endotracheal or Tracheotomy tube secured Cuff deflated of air and inflated with saline Airway suctioned Electronic Signature(s) Signed: 03/18/2015 4:03:44 [name] By: Christin Fudge MD, FACS Previous Signature: 03/18/2015 3:37:05 [name] Version By: Lorine Bears RCP, RRT, CHT Entered By: Christin Fudge on 03/18/2015 16:03:43

## 2015-03-20 NOTE — Progress Notes (Signed)
[name] ([id number]) Visit Report for 03/19/2015 Arrival Information Details Patient Name: [name], [name]. Date of Service: 03/19/2015 12:45 PM Medical Record Number: [id number] Patient Account Number: [id number] Date of Birth/Sex: 10/04/1965 (50 y.o. Male) Treating RN: Ahmed Prima Primary Care Physician: [name] Other Clinician: Referring Physician: [name] Treating Physician/Extender: Frann Rider in Treatment: 10 Visit Information History Since Last Visit All ordered tests and consults were completed: No Patient Arrived: Ambulatory Added or deleted any medications: No Arrival Time: 12:45 Any new allergies or adverse reactions: No Accompanied By: self Had a fall or experienced change in No Transfer Assistance: None activities of daily living that may affect Patient Identification Verified: Yes risk of falls: Secondary Verification Process Yes Signs or symptoms of abuse/neglect since last No Completed: visito Patient Requires Transmission-Based No Hospitalized since last visit: No Precautions: Pain Present Now: No Patient Has Alerts: Yes Electronic Signature(s) Signed: 03/19/2015 3:06:35 PM By: Alric Quan Entered By: Alric Quan on 03/19/2015 12:45:41 [name] ([id number]) -------------------------------------------------------------------------------- Encounter Discharge Information Details Patient Name: [name] Date of Service: 03/19/2015 12:45 PM Medical Record Number: [id number] Patient Account Number: [id number] Date of Birth/Sex: August 13, 1965 (50 y.o. Male) Treating RN: Ahmed Prima Primary Care Physician: [name] Other Clinician: Referring Physician: [name] Treating Physician/Extender: Frann Rider in Treatment: 10 Encounter Discharge Information Items Discharge Pain Level: 0 Discharge Condition: Stable Ambulatory Status: Ambulatory Other  (Note Discharge Destination: Required) Transportation: Private Auto Accompanied By: self Schedule Follow-up Appointment: Yes Medication Reconciliation completed and provided to Patient/Care Yes [name]: Clinical Summary of Care: Electronic Signature(s) Signed: 03/19/2015 3:06:35 PM By: Alric Quan Entered By: Alric Quan on 03/19/2015 13:08:48 [name] ([id number]) -------------------------------------------------------------------------------- Lower Extremity Assessment Details Patient Name: [name] Date of Service: 03/19/2015 12:45 PM Medical Record Number: [id number] Patient Account Number: [id number] Date of Birth/Sex: February 08, 1965 (50 y.o. Male) Treating RN: Ahmed Prima Primary Care Physician: [name] Other Clinician: Referring Physician: [name] Treating Physician/Extender: Frann Rider in Treatment: 10 Vascular Assessment Pulses: Posterior Tibial Dorsalis Pedis Palpable: [Right:Yes] Extremity colors, hair growth, and conditions: Extremity Color: [Right:Normal] Temperature of Extremity: [Right:Warm] Capillary Refill: [Right:< 3 seconds] Toe Nail Assessment Left: Right: Thick: No Discolored: No Deformed: No Improper Length and Hygiene: No Electronic Signature(s) Signed: 03/19/2015 3:06:35 PM By: Alric Quan Entered By: Alric Quan on 03/19/2015 12:48:56 [name] ([id number]) -------------------------------------------------------------------------------- Multi Wound Chart Details Patient Name: [name] Date of Service: 03/19/2015 12:45 PM Medical Record Number: [id number] Patient Account Number: [id number] Date of Birth/Sex: 11-21-1965 (50 y.o. Male) Treating RN: Ahmed Prima Primary Care Physician: [name] Other Clinician: Referring Physician: [name] Treating Physician/Extender: Frann Rider in Treatment: 10 Vital Signs Height(in):  74 Pulse(bpm): 101 Weight(lbs): 288 Blood Pressure 141/66 (mmHg): Body Mass Index(BMI): 37 Temperature(F): 97.5 Respiratory Rate 18 (breaths/min): Photos: [1:No Photos] [2:No Photos] [N/A:N/A] Wound Location: [1:Right Foot - Plantar] [2:Right Metatarsal head fifth N/A - Lateral] Wounding Event: [1:Gradually Appeared] [2:Bump] [N/A:N/A] Primary Etiology: [1:Diabetic Wound/Ulcer of the Lower Extremity] [2:Diabetic Wound/Ulcer of N/A the Lower Extremity] Comorbid History: [1:Hypertension, Type II Diabetes, Neuropathy] [2:Hypertension, Type II Diabetes, Neuropathy] [N/A:N/A] Date Acquired: [1:12/30/2014] [2:02/19/2015] [N/A:N/A] Weeks of Treatment: [1:10] [2:4] [N/A:N/A] Wound Status: [1:Open] [2:Open] [N/A:N/A] Measurements L x W x D 0.1x0.1x0.1 [2:2.5x1x0.2] [N/A:N/A] (cm) Area (cm) : [1:0.008] [2:1.963] [N/A:N/A] Volume (cm) : [1:0.001] [2:0.393] [N/A:N/A] % Reduction in Area: [1:99.00%] [2:N/A] [N/A:N/A] % Reduction in Volume: 99.60% [2:N/A] [N/A:N/A] Classification: [1:Grade 3] [2:Grade 3] [N/A:N/A] Wagner Verification: [1:Abscess] [2:Abscess] [N/A:N/A]  Exudate Amount: [1:None Present] [2:Large] [N/A:N/A] Exudate Type: [1:N/A] [2:Serosanguineous] [N/A:N/A] Exudate Color: [1:N/A] [2:red, brown] [N/A:N/A] Wound Margin: [1:Distinct, outline attached] [2:Flat and Intact] [N/A:N/A] Granulation Amount: [1:None Present (0%)] [2:Large (67-100%)] [N/A:N/A] Granulation Quality: [1:N/A] [2:Red] [N/A:N/A] Necrotic Amount: [1:Large (67-100%)] [2:Small (1-33%)] [N/A:N/A] Necrotic Tissue: [1:Eschar, Adherent Slough] [2:Adherent Slough] [N/A:N/A] Exposed Structures: [1:Fascia: No Fat: No Tendon: No] [2:Fascia: No Fat: No Tendon: No] [N/A:N/A] Muscle: No Muscle: No Joint: No Joint: No Bone: No Bone: No Limited to Skin Limited to Skin Breakdown Breakdown Epithelialization: None None N/A Periwound Skin Texture: Callus: Yes Edema: No N/A Edema: No Excoriation: No Excoriation:  No Induration: No Induration: No Callus: No Crepitus: No Crepitus: No Fluctuance: No Fluctuance: No Friable: No Friable: No Rash: No Rash: No Scarring: No Scarring: No Periwound Skin Dry/Scaly: Yes Moist: Yes N/A Moisture: Maceration: No Maceration: No Moist: No Dry/Scaly: No Periwound Skin Color: Atrophie Blanche: No Erythema: Yes N/A Cyanosis: No Atrophie Blanche: No Ecchymosis: No Cyanosis: No Erythema: No Ecchymosis: No Hemosiderin Staining: No Hemosiderin Staining: No Mottled: No Mottled: No Pallor: No Pallor: No Rubor: No Rubor: No Erythema Location: N/A Circumferential N/A Temperature: No Abnormality Hot N/A Tenderness on No Yes N/A Palpation: Wound Preparation: Ulcer Cleansing: Ulcer Cleansing: N/A Rinsed/Irrigated with Rinsed/Irrigated with Saline Saline Topical Anesthetic Topical Anesthetic Applied: Other: Lidocaine Applied: Other: lidocaine 4% 4% Assessment Notes: N/A 6 sutures in place N/A Treatment Notes Electronic Signature(s) Signed: 03/19/2015 3:06:35 PM By: Alric Quan Entered By: Alric Quan on 03/19/2015 12:56:09 [name] ([id number]) -------------------------------------------------------------------------------- [place] Details Patient Name: [name], [name]. Date of Service: 03/19/2015 12:45 PM Medical Record Number: [id number] Patient Account Number: [id number] Date of Birth/Sex: [birth year] (50 y.o. Male) Treating RN: Ahmed Prima Primary Care Physician: [name] Other Clinician: Referring Physician: [name] Treating Physician/Extender: Frann Rider in Treatment: 10 Active Inactive HBO Nursing Diagnoses: Anxiety related to feelings of confinement associated with the hyperbaric oxygen chamber Anxiety related to knowledge deficit of hyperbaric oxygen therapy and treatment procedures Discomfort related to temperature and humidity changes inside hyperbaric  chamber Potential for barotraumas to ears, sinuses, teeth, and lungs or cerebral gas embolism related to changes in atmospheric pressure inside hyperbaric oxygen chamber Potential for oxygen toxicity seizures related to delivery of 100% oxygen at an increased atmospheric pressure Potential for pulmonary oxygen toxicity related to delivery of 100% oxygen at an increased atmospheric pressure Goals: Barotrauma will be prevented during HBO2 Date Initiated: 01/07/2015 Goal Status: Active Patient and/or family will be able to state/discuss factors appropriate to the management of their disease process during treatment Date Initiated: 01/07/2015 Goal Status: Active Patient will tolerate the hyperbaric oxygen therapy treatment Date Initiated: 01/07/2015 Goal Status: Active Patient will tolerate the internal climate of the chamber Date Initiated: 01/07/2015 Goal Status: Active Patient/caregiver will verbalize understanding of HBO goals, rationale, procedures and potential hazards Date Initiated: 01/07/2015 Goal Status: Active Signs and symptoms of pulmonary oxygen toxicity will be recognized and promptly addressed Date Initiated: 01/07/2015 Goal Status: Active Signs and symptoms of seizure will be recognized and promptly addressed ; seizing patients will suffer no harm Date Initiated: 01/07/2015 [name] ([id number]) Goal Status: Active Interventions: Administer a five (5) minute air break for patient if signs and symptoms of seizure appear and notify the hyperbaric physician Administer a ten (10) minute air break for patient if signs and symptoms of seizure appear and notify the hyperbaric physician Administer decongestants, per physician orders, prior to HBO2 Administer the correct therapeutic gas delivery based  on the patients needs and limitations, per physician order Assess and provide for patientos comfort related to the hyperbaric environment and equalization of  middle ear Assess for signs and symptoms related to adverse events, including but not limited to confinement anxiety, pneumothorax, oxygen toxicity and baurotrauma Assess patient for any history of confinement anxiety Assess patient's knowledge and expectations regarding hyperbaric medicine and provide education related to the hyperbaric environment, goals of treatment and prevention of adverse events Implement protocols to decrease risk of pneumothorax in high risk patients Notes: Orientation to the Wound Care Program Nursing Diagnoses: Knowledge deficit related to the wound healing center program Goals: Patient/caregiver will verbalize understanding of the [facility] Date Initiated: 01/07/2015 Goal Status: Active Interventions: Provide education on orientation to the wound center Notes: Peripheral Neuropathy Nursing Diagnoses: Knowledge deficit related to disease process and management of peripheral neurovascular dysfunction Potential alteration in peripheral tissue perfusion (select prior to confirmation of diagnosis) Goals: Patient/caregiver will verbalize understanding of disease process and disease management Date Initiated: 01/07/2015 Goal Status: Active [name], [name] ([id number]) Interventions: Assess signs and symptoms of neuropathy upon admission and as needed Provide education on Management of Neuropathy and Related Ulcers Provide education on Management of Neuropathy upon discharge from the [facility] for HBO Treatment Activities: Consult for HBO : 03/19/2015 Patient referred for customized footwear/offloading : 03/19/2015 Notes: Wound/Skin Impairment Nursing Diagnoses: Impaired tissue integrity Knowledge deficit related to ulceration/compromised skin integrity Goals: Patient/caregiver will verbalize understanding of skin care regimen Date Initiated: 01/07/2015 Goal Status: Active Ulcer/skin breakdown will have a volume reduction of  30% by week 4 Date Initiated: 01/07/2015 Goal Status: Active Ulcer/skin breakdown will have a volume reduction of 50% by week 8 Date Initiated: 01/07/2015 Goal Status: Active Ulcer/skin breakdown will have a volume reduction of 80% by week 12 Date Initiated: 01/07/2015 Goal Status: Active Ulcer/skin breakdown will heal within 14 weeks Date Initiated: 01/07/2015 Goal Status: Active Interventions: Assess patient/caregiver ability to obtain necessary supplies Assess patient/caregiver ability to perform ulcer/skin care regimen upon admission and as needed Assess ulceration(s) every visit Provide education on ulcer and skin care Treatment Activities: Referred to DME Oluwateniola Leitch for dressing supplies : 03/19/2015 Skin care regimen initiated : 03/19/2015 [name], [name] ([id number]) Topical wound management initiated : 03/19/2015 Notes: Electronic Signature(s) Signed: 03/19/2015 3:06:35 PM By: Alric Quan Entered By: Alric Quan on 03/19/2015 12:56:01 [name] ([id number]) -------------------------------------------------------------------------------- Pain Assessment Details Patient Name: [name] Date of Service: 03/19/2015 12:45 PM Medical Record Number: [id number] Patient Account Number: [id number] Date of Birth/Sex: [birth year] (50 y.o. Male) Treating RN: Ahmed Prima Primary Care Physician: [name] Other Clinician: Referring Physician: [name] Treating Physician/Extender: Frann Rider in Treatment: 10 Active Problems Location of Pain Severity and Description of Pain Patient Has Paino No Site Locations Pain Management and Medication Current Pain Management: Electronic Signature(s) Signed: 03/19/2015 3:06:35 PM By: Alric Quan Entered By: Alric Quan on 03/19/2015 12:45:48 [name] ([id number]) -------------------------------------------------------------------------------- Patient/Caregiver Education  Details Patient Name: [name] Date of Service: 03/19/2015 12:45 PM Medical Record Number: [id number] Patient Account Number: [id number] Date of Birth/Gender: 10-11-1965 (50 y.o. Male) Treating RN: Ahmed Prima Primary Care Physician: [name] Other Clinician: Referring Physician: [name] Treating Physician/Extender: Frann Rider in Treatment: 10 Education Assessment Education Provided To: Patient Education Topics Provided Wound/Skin Impairment: Handouts: Other: change dressing as ordered Methods: Demonstration, Explain/Verbal Responses: State content correctly Electronic Signature(s) Signed: 03/19/2015 3:06:35 PM By: Alric Quan Entered By: Alric Quan on  03/19/2015 13:09:04 [name], [name] ([id number]) -------------------------------------------------------------------------------- Wound Assessment Details Patient Name: [name], [name]. Date of Service: 03/19/2015 12:45 PM Medical Record Number: [id number] Patient Account Number: [id number] Date of Birth/Sex: 1965-03-20 (50 y.o. Male) Treating RN: Ahmed Prima Primary Care Physician: [name] Other Clinician: Referring Physician: [name] Treating Physician/Extender: Frann Rider in Treatment: 10 Wound Status Wound Number: 1 Primary Diabetic Wound/Ulcer of the Lower Etiology: Extremity Wound Location: Right Foot - Plantar Wound Status: Open Wounding Event: Gradually Appeared Comorbid Hypertension, Type II Diabetes, Date Acquired: 12/30/2014 History: Neuropathy Weeks Of Treatment: 10 Clustered Wound: No Photos Photo Uploaded By: Alric Quan on 03/19/2015 14:47:18 Wound Measurements Length: (cm) 0 % Reduction i Width: (cm) 0 % Reduction i Depth: (cm) 0 Epithelializa Area: (cm) 0 Volume: (cm) 0 n Area: 100% n Volume: 100% tion: Large (67-100%) Wound Description Classification: Grade 3 Wagner Verification: Abscess Wound  Margin: Distinct, outline attached Exudate Amount: None Present Foul Odor After Cleansing: No Wound Bed Granulation Amount: None Present (0%) Exposed Structure Necrotic Amount: None Present (0%) Fascia Exposed: No Fat Layer Exposed: No Tendon Exposed: No Muscle Exposed: No [name], [name] ([id number]) Joint Exposed: No Bone Exposed: No Limited to Skin Breakdown Periwound Skin Texture Texture Color No Abnormalities Noted: No No Abnormalities Noted: No Callus: Yes Atrophie Blanche: No Crepitus: No Cyanosis: No Excoriation: No Ecchymosis: No Fluctuance: No Erythema: No Friable: No Hemosiderin Staining: No Induration: No Mottled: No Localized Edema: No Pallor: No Rash: No Rubor: No Scarring: No Temperature / Pain Moisture Temperature: No Abnormality No Abnormalities Noted: No Dry / Scaly: Yes Maceration: No Moist: No Wound Preparation Ulcer Cleansing: Rinsed/Irrigated with Saline Topical Anesthetic Applied: None Electronic Signature(s) Signed: 03/19/2015 3:06:35 PM By: Alric Quan Entered By: Alric Quan on 03/19/2015 13:06:11 [name] ([id number]) -------------------------------------------------------------------------------- Wound Assessment Details Patient Name: [name] Date of Service: 03/19/2015 12:45 PM Medical Record Number: [id number] Patient Account Number: [id number] Date of Birth/Sex: 08-29-1965 (50 y.o. Male) Treating RN: Ahmed Prima Primary Care Physician: [name] Other Clinician: Referring Physician: [name] Treating Physician/Extender: Frann Rider in Treatment: 10 Wound Status Wound Number: 2 Primary Diabetic Wound/Ulcer of the Lower Etiology: Extremity Wound Location: Right Metatarsal head fifth - Lateral Wound Status: Open Wounding Event: Bump Comorbid Hypertension, Type II Diabetes, History: Neuropathy Date Acquired: 02/19/2015 Weeks Of Treatment: 4 Clustered Wound:  No Photos Photo Uploaded By: Alric Quan on 03/19/2015 14:47:18 Wound Measurements Length: (cm) 2.5 Width: (cm) 1 Depth: (cm) 0.2 Area: (cm) 1.963 Volume: (cm) 0.393 % Reduction in Area: % Reduction in Volume: Epithelialization: None Wound Description Classification: Grade 3 Wagner Verification: Abscess Wound Margin: Flat and Intact Exudate Amount: Large Exudate Type: Serosanguineous Exudate Color: red, brown Foul Odor After Cleansing: No Wound Bed Granulation Amount: Large (67-100%) Exposed Structure Granulation Quality: Red Fascia Exposed: No [name], [name] ([id number]) Necrotic Amount: Small (1-33%) Fat Layer Exposed: No Necrotic Quality: Adherent Slough Tendon Exposed: No Muscle Exposed: No Joint Exposed: No Bone Exposed: No Limited to Skin Breakdown Periwound Skin Texture Texture Color No Abnormalities Noted: No No Abnormalities Noted: No Callus: No Atrophie Blanche: No Crepitus: No Cyanosis: No Excoriation: No Ecchymosis: No Fluctuance: No Erythema: Yes Friable: No Erythema Location: Circumferential Induration: No Hemosiderin Staining: No Localized Edema: No Mottled: No Rash: No Pallor: No Scarring: No Rubor: No Moisture Temperature / Pain No Abnormalities Noted: No Temperature: Hot Dry / Scaly: No Tenderness on Palpation: Yes Maceration: No Moist: Yes Wound Preparation Ulcer Cleansing: Rinsed/Irrigated with Saline Topical Anesthetic  Applied: Other: lidocaine 4%, Assessment Notes 6 sutures in place Treatment Notes Wound #2 (Right, Lateral Metatarsal head fifth) 1. Cleansed with: Clean wound with Normal Saline 2. Anesthetic Topical Lidocaine 4% cream to wound bed prior to debridement 4. Dressing Applied: Prisma Ag 5. Secondary Dressing Applied Guaze, ABD and kerlix/Conform 7. Secured with Recruitment consultant) Signed: 03/19/2015 3:06:35 PM By: Madelynn Done ([id number]) Entered By:  Alric Quan on 03/19/2015 12:55:00 [name] ([id number]) -------------------------------------------------------------------------------- Vitals Details Patient Name: [name], [name]. Date of Service: 03/19/2015 12:45 PM Medical Record Number: [id number] Patient Account Number: [id number] Date of Birth/Sex: 1965-05-22 (50 y.o. Male) Treating RN: Ahmed Prima Primary Care Physician: [name] Other Clinician: Referring Physician: [name] Treating Physician/Extender: Frann Rider in Treatment: 10 Vital Signs Time Taken: 12:45 Temperature (F): 97.5 Height (in): 74 Pulse (bpm): 101 Weight (lbs): 288 Respiratory Rate (breaths/min): 18 Body Mass Index (BMI): 37 Blood Pressure (mmHg): 141/66 Reference Range: 80 - 120 mg / dl Electronic Signature(s) Signed: 03/19/2015 3:06:35 PM By: Alric Quan Entered By: Alric Quan on 03/19/2015 12:48:03

## 2015-03-20 NOTE — Progress Notes (Signed)
[name], [name] ([id number]) Visit Report for 03/19/2015 Arrival Information Details ALVAREZ, PACCIONE 03/19/2015 1:30 Patient Name: Date of Service: [name] Medical Record Patient Account Number: [id number] Number: Treating RN: Date of Birth/Sex: 31-Jan-1965 (50 y.o. Male) Other Clinician: [name] Primary Care Physician: [name] Treating Britto, Errol Referring Physician: [name] Physician/Extender: Suella Grove in Treatment: 10 Visit Information History Since Last Visit Added or deleted any medications: No Patient Arrived: Ambulatory Any new allergies or adverse reactions: No Arrival Time: 13:15 Had a fall or experienced change in No Accompanied By: self activities of daily living that may affect Transfer Assistance: None risk of falls: Patient Identification Verified: Yes Signs or symptoms of abuse/neglect No Secondary Verification Process Yes since last visito Completed: Hospitalized since last visit: No Patient Requires Transmission-Based No Has Dressing in Place as Prescribed: Yes Precautions: Has Footwear/Offloading in Place as Yes Patient Has Alerts: Yes Prescribed: Left: Surgical Shoe with Pressure Relief Insole Pain Present Now: No Electronic Signature(s) Signed: 03/19/2015 3:38:50 PM By: Lorine Bears RCP, RRT, CHT Entered By: Lorine Bears on 03/19/2015 13:27:30 [name] ([id number]) -------------------------------------------------------------------------------- Encounter Discharge Information Details [name], [name] 03/19/2015 1:30 Patient Name: Date of Service: [name] Medical Record Patient Account Number: [id number] Number: Treating RN: Date of Birth/Sex: Mar 16, 1965 (50 y.o. Male) Other Clinician: [name] Primary Care Physician: [name] Treating Christin Fudge Referring Physician: [name] Physician/Extender: Suella Grove in Treatment:  10 Encounter Discharge Information Items Discharge Pain Level: 0 Discharge Condition: Stable Ambulatory Status: Ambulatory Discharge Destination: Home Private Transportation: Auto Accompanied By: self Schedule Follow-up Appointment: No Medication Reconciliation completed and No provided to Patient/Care Scott Fix: Clinical Summary of Care: Notes Patient has an HBO treatment scheduled on 03/22/15 at 13:00 pm. Electronic Signature(s) Signed: 03/19/2015 3:38:50 PM By: Lorine Bears RCP, RRT, CHT Entered By: Lorine Bears on 03/19/2015 15:38:20 [name] ([id number]) -------------------------------------------------------------------------------- Vitals Details [name], [name] 03/19/2015 1:30 Patient Name: Date of Service: [name] Medical Record Patient Account Number: [id number] Number: Treating RN: Date of Birth/Sex: 05-29-1965 (50 y.o. Male) Other Clinician: [name] Primary Care Physician: [name] Treating Britto, Errol Referring Physician: [name] Physician/Extender: Suella Grove in Treatment: 10 Vital Signs Time Taken: 12:48 Temperature (F): 97.5 Height (in): 74 Pulse (bpm): 101 Weight (lbs): 288 Respiratory Rate (breaths/min): 18 Body Mass Index (BMI): 37 Blood Pressure (mmHg): 141/66 Capillary Blood Glucose (mg/dl): 155 Reference Range: 80 - 120 mg / dl Electronic Signature(s) Signed: 03/19/2015 3:38:50 PM By: Lorine Bears RCP, RRT, CHT Entered By: Becky Sax, Amado Nash on 03/19/2015 13:28:12

## 2015-03-20 NOTE — Progress Notes (Addendum)
[name], [name] ([id number]) Visit Report for 03/19/2015 HBO Details [name], [name] 03/19/2015 1:30 Patient Name: Date of Service: [name] Medical Record Patient Account Number: [id number] Number: Treating RN: Date of Birth/Sex: 05-06-1965 (50 y.o. Male) Other Clinician: [name] Primary Care Physician: [name] Treating Britto, Errol Referring Physician: [name] Physician/Extender: Suella Grove in Treatment: 10 HBO Treatment Course Details Treatment Course Ordering Physician: Christin Fudge 1 Number: HBO Treatment Start Date: 02/01/2015 Total Treatments 30 Ordered: HBO Indication: Diabetic Ulcer(s) of the Lower Extremity HBO Treatment Details Treatment Number: 27 Patient Type: Outpatient Chamber Type: Monoplace Chamber Serial #: [id number] Treatment Protocol: 2.0 ATA with 90 minutes oxygen, and no air breaks Treatment Details Compression Rate Down: 1.5 psi / minute De-Compression Rate Up: 1.5 psi / minute Air breaks and breathing Compress Tx Pressure periods Decompress Decompress Begins Reached (leave unused spaces Begins Ends blank) Chamber Pressure (ATA) 1 2 - - - - - - 2 1 Clock Time (24 hr) 13:25 13:37 - - - - - - 15:07 15:17 Treatment Length: 112 (minutes) Treatment Segments: 4 Capillary Blood Glucose Pre Capillary Blood Glucose (mg/dl): Post Capillary Blood Glucose (mg/dl): Vital Signs Capillary Blood Glucose Reference Range: 80 - 120 mg / dl HBO Diabetic Blood Glucose Intervention Range: <131 mg/dl or >249 mg/dl Time Vitals Blood Respiratory Capillary Blood Glucose Pulse Action Type: Pulse: Temperature: Taken: Pressure: Rate: Glucose (mg/dl): Meter #: Oximetry (%) Taken: Pre 12:48 141/66 101 18 97.5 155 1 none Post 15:22 112/70 78 18 98.3 141 1 none [name], [name] J. ([id number]) Treatment Response Treatment Completion Status: Treatment Completed without Adverse Event HBO Attestation I certify that I supervised this  HBO treatment in accordance with Medicare guidelines. A trained Yes emergency response team is readily available per hospital policies and procedures. Continue HBOT as ordered. Yes Electronic Signature(s) Signed: 03/19/2015 3:39:46 PM By: Christin Fudge MD, FACS Previous Signature: 03/19/2015 3:37:46 PM Version By: Christin Fudge MD, FACS Previous Signature: 03/19/2015 3:38:50 PM Version By: Lorine Bears RCP, RRT, CHT Entered By: Christin Fudge on 03/19/2015 15:39:46 [name], [name] ([id number]) -------------------------------------------------------------------------------- HBO Safety Checklist Details [name], [name] 03/19/2015 1:30 Patient Name: Date of Service: [name] Medical Record Patient Account Number: [id number] Number: Treating RN: Date of Birth/Sex: 1965-12-28 (50 y.o. Male) Other Clinician: [name] Primary Care Physician: [name] Treating Britto, Errol Referring Physician: [name] Physician/Extender: Suella Grove in Treatment: 10 HBO Safety Checklist Items Safety Checklist Consent Form Signed Patient voided / foley secured and emptied When did you last eato 11:00 am Last dose of injectable or oral agent 11:00 am NA Ostomy pouch emptied and vented if applicable NA All implantable devices assessed, documented and approved Intravenous access site secured and place Valuables secured Linens and cotton and cotton/polyester blend (less than 51% polyester) Personal oil-based products / skin lotions / body lotions removed Wigs or hairpieces removed Smoking or tobacco materials removed Books / newspapers / magazines / loose paper removed Cologne, aftershave, perfume and deodorant removed Jewelry removed (may wrap wedding band) Make-up removed Hair care products removed Battery operated devices (external) removed Heating patches and chemical warmers removed NA Titanium eyewear removed NA Nail polish cured greater than 10  hours NA Casting material cured greater than 10 hours NA Hearing aids removed NA Loose dentures or partials removed NA Prosthetics have been removed Patient demonstrates correct use of air break device (if applicable) Patient concerns have been addressed Patient grounding bracelet on and cord attached to chamber Specifics for Inpatients (complete in addition to  above) Medication sheet sent with patient RICKARD, WICKERS ([id number]) Intravenous medications needed or due during therapy sent with patient Drainage tubes (e.g. nasogastric tube or chest tube secured and vented) Endotracheal or Tracheotomy tube secured Cuff deflated of air and inflated with saline Airway suctioned Electronic Signature(s) Signed: 03/19/2015 3:38:50 PM By: Lorine Bears RCP, RRT, CHT Entered By: Lorine Bears on 03/19/2015 13:28:56

## 2015-03-22 ENCOUNTER — Encounter: Payer: Managed Care, Other (non HMO) | Admitting: Surgery

## 2015-03-22 DIAGNOSIS — E11621 Type 2 diabetes mellitus with foot ulcer: Secondary | ICD-10-CM | POA: Diagnosis not present

## 2015-03-22 LAB — GLUCOSE, CAPILLARY
Glucose-Capillary: 217 mg/dL — ABNORMAL HIGH (ref 65–99)
Glucose-Capillary: 139 mg/dL — ABNORMAL HIGH (ref 65–99)

## 2015-03-22 NOTE — Progress Notes (Signed)
[name] ([id number]) Visit Report for 03/22/2015 Arrival Information Details [name], [name] 03/22/2015 1:00 Patient Name: Date of Service: [name] Medical Record Patient Account Number: [id number] Number: Treating RN: Date of Birth/Sex: Mar 01, 1965 (50 y.o. Male) Other Clinician: [name] Primary Care Physician: [name] Treating Britto, Errol Referring Physician: [name] Physician/Extender: Suella Grove in Treatment: 10 Visit Information History Since Last Visit Added or deleted any medications: No Patient Arrived: Ambulatory Any new allergies or adverse reactions: No Arrival Time: 12:57 Had a fall or experienced change in No Accompanied By: self activities of daily living that may affect Transfer Assistance: None risk of falls: Patient Identification Verified: Yes Signs or symptoms of abuse/neglect No Secondary Verification Process Yes since last visito Completed: Hospitalized since last visit: No Patient Requires Transmission-Based No Has Dressing in Place as Prescribed: Yes Precautions: Has Footwear/Offloading in Place as Yes Patient Has Alerts: Yes Prescribed: Left: Surgical Shoe with Pressure Relief Insole Pain Present Now: No Electronic Signature(s) Signed: 03/22/2015 3:10:42 PM By: Lorine Bears RCP, RRT, CHT Entered By: Lorine Bears on 03/22/2015 12:59:28 [name] ([id number]) -------------------------------------------------------------------------------- Encounter Discharge Information Details [name], [name] 03/22/2015 1:00 Patient Name: Date of Service: [name] Medical Record Patient Account Number: [id number] Number: Treating RN: Date of Birth/Sex: [birth year] (50 y.o. Male) Other Clinician: [name] Primary Care Physician: [name] Treating Christin Fudge Referring Physician: [name] Physician/Extender: Suella Grove in Treatment:  10 Encounter Discharge Information Items Discharge Pain Level: 0 Discharge Condition: Stable Ambulatory Status: Ambulatory Discharge Destination: Home Private Transportation: Auto Accompanied By: self Schedule Follow-up Appointment: No Medication Reconciliation completed and No provided to Patient/Care [name]: Clinical Summary of Care: Notes Patient has an HBO treatment scheduled on 03/23/15 at 13:00 pm. Electronic Signature(s) Signed: 03/22/2015 3:10:42 PM By: Lorine Bears RCP, RRT, CHT Entered By: Lorine Bears on 03/22/2015 15:10:23 [name] ([id number]) -------------------------------------------------------------------------------- Vitals Details [name], [name] 03/22/2015 1:00 Patient Name: Date of Service: [name] Medical Record Patient Account Number: [id number] Number: Treating RN: Date of Birth/Sex: January 25, 1965 (50 y.o. Male) Other Clinician: [name] Primary Care Physician: [name] Treating Britto, Errol Referring Physician: [name] Physician/Extender: Suella Grove in Treatment: 10 Vital Signs Time Taken: 12:44 Temperature (F): 98.0 Height (in): 74 Pulse (bpm): 84 Weight (lbs): 288 Respiratory Rate (breaths/min): 18 Body Mass Index (BMI): 37 Blood Pressure (mmHg): 128/78 Capillary Blood Glucose (mg/dl): 217 Reference Range: 80 - 120 mg / dl Electronic Signature(s) Signed: 03/22/2015 3:10:42 PM By: Lorine Bears RCP, RRT, CHT Entered By: Lorine Bears on 03/22/2015 13:04:42

## 2015-03-22 NOTE — Progress Notes (Signed)
[name], [name] ([id number]) Visit Report for 03/22/2015 HBO Details [name], [name] 03/22/2015 1:00 Patient Name: Date of Service: [name] Medical Record Patient Account Number: [id number] Number: Treating RN: Date of Birth/Sex: [birth year] (50 y.o. Male) Other Clinician: [name] Primary Care Physician: [name] Treating Britto, Errol Referring Physician: [name] Physician/Extender: Suella Grove in Treatment: 10 HBO Treatment Course Details Treatment Course Ordering Physician: Christin Fudge 1 Number: HBO Treatment Start Date: 02/01/2015 Total Treatments 30 Ordered: HBO Indication: Diabetic Ulcer(s) of the Lower Extremity HBO Treatment Details Treatment Number: 28 Patient Type: Outpatient Chamber Type: Monoplace Chamber Serial #: [id number] Treatment Protocol: 2.0 ATA with 90 minutes oxygen, and no air breaks Treatment Details Compression Rate Down: 1.5 psi / minute De-Compression Rate Up: 1.5 psi / minute Air breaks and breathing Compress Tx Pressure periods Decompress Decompress Begins Reached (leave unused spaces Begins Ends blank) Chamber Pressure (ATA) 1 2 - - - - - - 2 1 Clock Time (24 hr) 12:57 13:09 - - - - - - 14:39 14:49 Treatment Length: 112 (minutes) Treatment Segments: 4 Capillary Blood Glucose Pre Capillary Blood Glucose (mg/dl): Post Capillary Blood Glucose (mg/dl): Vital Signs Capillary Blood Glucose Reference Range: 80 - 120 mg / dl HBO Diabetic Blood Glucose Intervention Range: <131 mg/dl or >249 mg/dl Time Vitals Blood Respiratory Capillary Blood Glucose Pulse Action Type: Pulse: Temperature: Taken: Pressure: Rate: Glucose (mg/dl): Meter #: Oximetry (%) Taken: Pre 12:44 128/78 84 18 98 217 1 0 Post 14:52 124/70 84 18 98.2 139 1 none [name], [name] J. ([id number]) Treatment Response Treatment Completion Status: Treatment Completed without Adverse Event HBO Attestation I certify that I supervised this HBO  treatment in accordance with Medicare guidelines. A trained Yes emergency response team is readily available per hospital policies and procedures. Continue HBOT as ordered. Yes Electronic Signature(s) Signed: 03/22/2015 3:29:28 PM By: Christin Fudge MD, FACS Previous Signature: 03/22/2015 3:10:42 PM Version By: Lorine Bears RCP, RRT, CHT Entered By: Christin Fudge on 03/22/2015 15:29:27 [name], [name] ([id number]) -------------------------------------------------------------------------------- HBO Safety Checklist Details [name], [name] 03/22/2015 1:00 Patient Name: Date of Service: [name] Medical Record Patient Account Number: [id number] Number: Treating RN: Date of Birth/Sex: 28-May-1965 (50 y.o. Male) Other Clinician: [name] Primary Care Physician: [name] Treating Britto, Errol Referring Physician: [name] Physician/Extender: Suella Grove in Treatment: 10 HBO Safety Checklist Items Safety Checklist Consent Form Signed Patient voided / foley secured and emptied When did you last eato 11:15 am Last dose of injectable or oral agent 08:00 am NA Ostomy pouch emptied and vented if applicable NA All implantable devices assessed, documented and approved Intravenous access site secured and place Valuables secured Linens and cotton and cotton/polyester blend (less than 51% polyester) Personal oil-based products / skin lotions / body lotions removed Wigs or hairpieces removed Smoking or tobacco materials removed Books / newspapers / magazines / loose paper removed Cologne, aftershave, perfume and deodorant removed Jewelry removed (may wrap wedding band) Make-up removed Hair care products removed Battery operated devices (external) removed Heating patches and chemical warmers removed NA Titanium eyewear removed NA Nail polish cured greater than 10 hours NA Casting material cured greater than 10 hours NA Hearing aids  removed NA Loose dentures or partials removed NA Prosthetics have been removed Patient demonstrates correct use of air break device (if applicable) Patient concerns have been addressed Patient grounding bracelet on and cord attached to chamber Specifics for Inpatients (complete in addition to above) Medication sheet sent with patient [name], [name] ([id number]) Intravenous  medications needed or due during therapy sent with patient Drainage tubes (e.g. nasogastric tube or chest tube secured and vented) Endotracheal or Tracheotomy tube secured Cuff deflated of air and inflated with saline Airway suctioned Electronic Signature(s) Signed: 03/22/2015 3:10:42 PM By: Lorine Bears RCP, RRT, CHT Entered By: Lorine Bears on 03/22/2015 13:06:17

## 2015-03-23 ENCOUNTER — Encounter: Payer: Managed Care, Other (non HMO) | Admitting: Internal Medicine

## 2015-03-23 ENCOUNTER — Encounter: Payer: Self-pay | Admitting: Sports Medicine

## 2015-03-23 ENCOUNTER — Ambulatory Visit (INDEPENDENT_AMBULATORY_CARE_PROVIDER_SITE_OTHER): Payer: Managed Care, Other (non HMO) | Admitting: Sports Medicine

## 2015-03-23 DIAGNOSIS — E11621 Type 2 diabetes mellitus with foot ulcer: Secondary | ICD-10-CM | POA: Diagnosis not present

## 2015-03-23 DIAGNOSIS — M79671 Pain in right foot: Secondary | ICD-10-CM

## 2015-03-23 DIAGNOSIS — Z89421 Acquired absence of other right toe(s): Secondary | ICD-10-CM

## 2015-03-23 LAB — GLUCOSE, CAPILLARY
Glucose-Capillary: 188 mg/dL — ABNORMAL HIGH (ref 65–99)
Glucose-Capillary: 173 mg/dL — ABNORMAL HIGH (ref 65–99)

## 2015-03-23 NOTE — Progress Notes (Signed)
Patient ID: [name], male   DOB: Mar 16, 1965, 50 y.o.   MRN: [id number]  Subjective: [name] is a 50 y.o. 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 Patient currently on Vanco and Cefipime PICC Line Abx without complication and going to HBO with wound care by Dr. Con Memos with use of prisma at the wound site; reports that he is to see ID, Dr. [name] this week and its likely that his picc line abx will be completed. Patient denies pain at surgical site, denies calf pain, denies headache, chest pain, shortness of breath, nausea, vomiting, fever, or chills. Patient states that he is doing well. No other issues noted.   Patient Active Problem List   Diagnosis Date Noted  . Influenza A 03/09/2015  . Fever, unspecified 03/08/2015  . Nausea and vomiting 03/08/2015  . Hyperlipidemia 03/04/2015  . Osteomyelitis ([place]) 03/04/2015  . Edema leg 02/26/2015  . Pain of right lower leg 02/26/2015  . 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 Diabetes mellitus ([place]) 02/10/2015  . Foot osteomyelitis, right ([place]) 02/04/2015  . Type II diabetes mellitus ([place]) 07/10/2007  . HYPERTENSION 07/10/2007    Current Outpatient Prescriptions on File Prior to Visit  Medication Sig Dispense Refill  . acetaminophen (TYLENOL) 325 MG tablet Take 2 tablets (650 mg total) by mouth every 6 (six) hours as needed for mild pain or headache.    Marland Kitchen aspirin EC 81 MG tablet Take 81 mg by mouth daily.    Marland Kitchen ceFEPIme 2 g in dextrose 5 % 50 mL Inject 2 g into the vein every 8 (eight) hours. Two-week supply then per Dr. Cannon Kettle 42 ampule 0  . fluconazole (DIFLUCAN) 200 MG tablet Take 200 mg by mouth daily.    Marland Kitchen glimepiride (AMARYL) 2 MG tablet Take 2 mg by mouth 2 (two) times daily.    . INVOKANA 300 MG TABS tablet Take 1 tablet (300 mg total) by mouth daily. 30 tablet   . metFORMIN (GLUCOPHAGE) 1000 MG  tablet Take 1,000 mg by mouth 2 (two) times daily.    . Multiple Vitamin (MULTIVITAMIN) tablet Take 1 tablet by mouth daily.    . Olmesartan-Amlodipine-HCTZ 40-10-25 MG TABS Take 1 tablet by mouth daily. Reported on 02/04/2015    . omega-3 acid ethyl esters (LOVAZA) 1 g capsule Take 2 g by mouth daily.    . ondansetron (ZOFRAN) 4 MG tablet Take 1 tablet (4 mg total) by mouth every 6 (six) hours as needed for nausea. 20 tablet 0  . oxyCODONE-acetaminophen (ROXICET) 5-325 MG tablet Take 1 tablet by mouth every 6 (six) hours as needed for severe pain. 30 tablet 0  . traMADol (ULTRAM) 50 MG tablet Take 1 tablet (50 mg total) by mouth every 6 (six) hours as needed for moderate pain. 30 tablet 0  . TRESIBA FLEXTOUCH 200 UNIT/ML SOPN Inject 100 Units into the skin daily.    . vancomycin 1,250 mg in sodium chloride 0.9 % 250 mL Inject 1,250 mg into the vein every 12 (twelve) hours. Per home health RN 28 each 0   No current facility-administered medications on file prior to visit.    No Known Allergies  Objective: There were no vitals filed for this visit.  General: No acute distress, AAOx3  Right foot: Sutures intact at proximal incision with no gapping or dehiscence at surgical site, distally granular ulceration that measures 2.4x 1.2x0.2cm (  last measurement 2.8x1.5x0.2cm) with no surrounding infection, mild swelling to right forefoot, no erythema, no warmth, no drainage, no signs of infection noted, Capillary fill time <3 seconds in all remaning digits, gross sensation present via light touch to right foot. No pain or crepitation with range of motion right foot.  No pain with calf compression.   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 Con Memos. -Cont with HBO and removing ace as needed for HBO treatments -Advised patient to continue WB with post-op shoe on right foot will consider custom diabetic inserts at next encounter in preparation to transitioning to extra depth new balance shoes once ulceration heals. -Cont with PICC Line IV antibiotics with recommendations are per ID for corynbacterium, Dr. [name]. Anticipate course of 2 weeks total for soft tissue coverage which will likely end this week   -Advised patient to limit activity to necessity  -Advised patient to elevate as necessary  -Patient to follow up in 4 weeks or sooner if issues arise; will Xray at next visit and write Rx for custom molded plastizote inserts with amp accomodation. In the meantime, patient to call office if any issues or problems arise.   Landis Martins, DPM

## 2015-03-24 ENCOUNTER — Encounter: Payer: Managed Care, Other (non HMO) | Admitting: Internal Medicine

## 2015-03-24 DIAGNOSIS — E11621 Type 2 diabetes mellitus with foot ulcer: Secondary | ICD-10-CM | POA: Diagnosis not present

## 2015-03-24 LAB — GLUCOSE, CAPILLARY
Glucose-Capillary: 188 mg/dL — ABNORMAL HIGH (ref 65–99)
Glucose-Capillary: 152 mg/dL — ABNORMAL HIGH (ref 65–99)

## 2015-03-24 NOTE — Progress Notes (Signed)
FARZAN, SALOPEK ([id number]) Visit Report for 03/23/2015 Arrival Information Details [name] Date of Service: 03/23/2015 1:00 PM Patient Name: J. Patient Account Number: [id number] Medical Record Treating RN: [id number] Number: Other Clinician: [name] Date of Birth/Sex: 06-Jan-1965 (50 y.o. Male) Treating Dellia Nims, [name] Primary Care Physician/Extender: Floyde Parkins Physician: Referring Physician: Sharilyn Sites in Treatment: 10 Visit Information History Since Last Visit Added or deleted any medications: No Patient Arrived: Ambulatory Any new allergies or adverse reactions: No Arrival Time: 12:50 Had a fall or experienced change in No Accompanied By: self activities of daily living that may affect Transfer Assistance: None risk of falls: Patient Identification Verified: Yes Signs or symptoms of abuse/neglect No Secondary Verification Process Yes since last visito Completed: Hospitalized since last visit: No Patient Requires Transmission-Based No Has Dressing in Place as Prescribed: Yes Precautions: Has Footwear/Offloading in Place as Yes Patient Has Alerts: Yes Prescribed: Left: Surgical Shoe with Pressure Relief Insole Pain Present Now: No Electronic Signature(s) Signed: 03/23/2015 3:42:00 PM By: Lorine Bears RCP, RRT, CHT Entered By: Lorine Bears on 03/23/2015 13:03:29 [name] -------------------------------------------------------------------------------- Encounter Discharge Information Details [name] Date of Service: 03/23/2015 1:00 PM Patient Name: J. Patient Account Number: [id number] Medical Record Treating RN: [id number] Number: Other Clinician: [name] Date of Birth/Sex: 1965-05-28 (50 y.o. Male) Treating [name] Primary Care Physician/Extender: Floyde Parkins Physician: Referring Physician: Sharilyn Sites in Treatment:  10 Encounter Discharge Information Items Discharge Pain Level: 0 Discharge Condition: Stable Ambulatory Status: Ambulatory Discharge Destination: Home Private Transportation: Auto Accompanied By: self Schedule Follow-up Appointment: No Medication Reconciliation completed and No provided to Patient/Care Danelia Snodgrass: Clinical Summary of Care: Notes Patient has an HBO treatment scheduled on 03/24/15 at 13:00 pm. Electronic Signature(s) Signed: 03/23/2015 3:42:00 PM By: Lorine Bears RCP, RRT, CHT Entered By: Becky Sax, Amado Nash on 03/23/2015 15:41:25 [name] -------------------------------------------------------------------------------- Vitals Details [name] Date of Service: 03/23/2015 1:00 PM Patient Name: J. Patient Account Number: [id number] Medical Record Treating RN: [id number] Number: Other Clinician: [name] Date of Birth/Sex: [birth year] (50 y.o. Male) Treating [name] Primary Care Physician/Extender: Floyde Parkins Physician: Referring Physician: Sharilyn Sites in Treatment: 10 Vital Signs Time Taken: 12:51 Temperature (F): 97.9 Height (in): 74 Pulse (bpm): 96 Weight (lbs): 288 Respiratory Rate (breaths/min): 18 Body Mass Index (BMI): 37 Blood Pressure (mmHg): 132/80 Capillary Blood Glucose (mg/dl): 188 Reference Range: 80 - 120 mg / dl Electronic Signature(s) Signed: 03/23/2015 3:42:00 PM By: Lorine Bears RCP, RRT, CHT Entered By: Lorine Bears on 03/23/2015 13:04:06

## 2015-03-25 ENCOUNTER — Encounter: Payer: Managed Care, Other (non HMO) | Admitting: Surgery

## 2015-03-25 DIAGNOSIS — E11621 Type 2 diabetes mellitus with foot ulcer: Secondary | ICD-10-CM | POA: Diagnosis not present

## 2015-03-25 NOTE — Progress Notes (Signed)
GEORGE, SCHOOLING ([id number]) Visit Report for 03/24/2015 Arrival Information Details HOLT, LEARD Date of Service: 03/24/2015 1:00 PM Patient Name: J. Patient Account Number: [id number] Medical Record Treating RN: [id number] Number: Other Clinician: [name] Date of Birth/Sex: [birth year] (50 y.o. Male) Treating [name], [name] Primary Care Physician/Extender: Floyde Parkins Physician: Referring Physician: Sharilyn Sites in Treatment: 10 Visit Information History Since Last Visit Added or deleted any medications: No Patient Arrived: Ambulatory Any new allergies or adverse reactions: No Arrival Time: 12:49 Had a fall or experienced change in No Accompanied By: self activities of daily living that may affect Transfer Assistance: None risk of falls: Patient Identification Verified: Yes Signs or symptoms of abuse/neglect No Secondary Verification Process Yes since last visito Completed: Hospitalized since last visit: No Patient Requires Transmission-Based No Has Dressing in Place as Prescribed: Yes Precautions: Has Footwear/Offloading in Place as Yes Patient Has Alerts: Yes Prescribed: Left: Surgical Shoe with Pressure Relief Insole Pain Present Now: No Electronic Signature(s) Signed: 03/24/2015 4:01:46 PM By: Lorine Bears RCP, RRT, CHT Entered By: Lorine Bears on 03/24/2015 13:13:03 [name] -------------------------------------------------------------------------------- Encounter Discharge Information Details [name] Date of Service: 03/24/2015 1:00 PM Patient Name: J. Patient Account Number: [id number] Medical Record Treating RN: [id number] Number: Other Clinician: [name] Date of Birth/Sex: 1965/05/24 (50 y.o. Male) Treating [name], [name] Primary Care Physician/Extender: Floyde Parkins Physician: Referring Physician: Sharilyn Sites in Treatment:  10 Encounter Discharge Information Items Discharge Pain Level: 0 Discharge Condition: Stable Ambulatory Status: Ambulatory Discharge Destination: Home Private Transportation: Auto Accompanied By: self Schedule Follow-up Appointment: No Medication Reconciliation completed and No provided to Patient/Care Tallon Gertz: Clinical Summary of Care: Notes Patient will be scheduled as soon as we hear from his insurance Psychologist, counselling). Electronic Signature(s) Signed: 03/24/2015 4:01:46 PM By: Lorine Bears RCP, RRT, CHT Entered By: Lorine Bears on 03/24/2015 15:47:58 [name] -------------------------------------------------------------------------------- Vitals Details [name] Date of Service: 03/24/2015 1:00 PM Patient Name: J. Patient Account Number: [id number] Medical Record Treating RN: [id number] Number: Other Clinician: [name] Date of Birth/Sex: 09-25-1965 (50 y.o. Male) Treating [name], [name] Primary Care Physician/Extender: Floyde Parkins Physician: Referring Physician: Sharilyn Sites in Treatment: 10 Vital Signs Time Taken: 12:49 Temperature (F): 98.3 Height (in): 74 Pulse (bpm): 96 Weight (lbs): 288 Respiratory Rate (breaths/min): 18 Body Mass Index (BMI): 37 Blood Pressure (mmHg): 122/72 Capillary Blood Glucose (mg/dl): 188 Reference Range: 80 - 120 mg / dl Electronic Signature(s) Signed: 03/24/2015 4:01:46 PM By: Lorine Bears RCP, RRT, CHT Entered By: Lorine Bears on 03/24/2015 13:13:49

## 2015-03-26 ENCOUNTER — Ambulatory Visit: Payer: Managed Care, Other (non HMO) | Admitting: General Surgery

## 2015-03-26 ENCOUNTER — Encounter: Payer: Managed Care, Other (non HMO) | Admitting: General Surgery

## 2015-03-26 DIAGNOSIS — Z0000 Encounter for general adult medical examination without abnormal findings: Secondary | ICD-10-CM | POA: Insufficient documentation

## 2015-03-26 NOTE — Progress Notes (Addendum)
[name], [name] ([id number]) Visit Report for 03/25/2015 Chief Complaint Document Details [name], [name] 03/25/2015 2:15 Patient Name: Date of Service: [name] Medical Record Patient Account Number: [id number] Number: Treating RN: [name] Date of Birth/Sex: [birth year] (50 y.o. Male) Other Clinician: Primary Care Physician: [name] Treating [name] Referring Physician: [name] Physician/Extender: Weeks in Treatment: 11 Information Obtained from: Patient Chief Complaint Patients presents for treatment of an open diabetic ulcer on the plantar aspect of the right foot which she's had for about 4 weeks Electronic Signature(s) Signed: 03/25/2015 2:47:12 [name] By: [name] Fudge MD, FACS Entered By: [name] on 03/25/2015 14:47:12 [name] ([id number]) -------------------------------------------------------------------------------- HPI Details [name], [name] 03/25/2015 2:15 Patient Name: Date of Service: [name] Medical Record Patient Account Number: [id number] Number: Treating RN: [name] Date of Birth/Sex: 17-May-1965 (50 y.o. Male) Other Clinician: Primary Care Physician: [name] Treating [name] Referring Physician: [name] Physician/Extender: Weeks in Treatment: 11 History of Present Illness Location: plantar aspect of the right forefoot Quality: Patient reports No Pain. Severity: Patient states wound are getting worse. Duration: Patient has had the wound for < 4 weeks prior to presenting for treatment Timing: he has minimal discharge from the wound Context: The wound appeared gradually over time Modifying Factors: Other treatment(s) tried include:plan local care but not offloading Associated Signs and Symptoms: Patient reports having:no pain or discharge from the wound. HPI Description: This 50 year old male comes with an ulcerated area on the plantar aspect of the right  foot which she's had for approximately a month. I have known him from a previous visit at [facility] wound center and was treated in the months of April and May 2016 and rapidly healed a left plantar ulcer with a total contact cast. He has been a diabetic for about 16 years and tries to keep active and is fairly compliant with his diabetes management. 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 Past medical history significant for hypertension, hyperlipidemia, and status post appendectomy 1993. He does not smoke or drink alcohol. 01/14/2015 -- the patient had tolerated his total contact cast very well and had no problems and has had no systemic symptoms. However when his total contact cast was cut open he had excessive amount of purulent drainage in spite of being on antibiotics. He had had a recent x-ray done in the ER 12/22/2014 which showed IMPRESSION:No evidence of osseous erosion. Known soft tissue ulceration is not well characterized on radiograph. Scattered vascular calcifications seen. his last hemoglobin A1c in December was 7.3. He has been on Augmentin and doxycycline for the last 2 weeks. 01/21/2015 -- his culture grew rare growth of Pantoea species an MR moderate growth of Candida parapsilosis. it is sensitive to levofloxacin. He has not heard back from the insurance company regarding his hyperbaric oxygen therapy. His MRI has not been done yet and we will try and get him an earlier date 01/28/2015 -- MRI was done last night -- IMPRESSION:1. Soft tissue ulcer overlying the plantar aspect of the fifth metatarsal head extending to the cortex. Subcortical marrow edema in the fifth metatarsal head with corresponding T1 hypointensity is concerning for early osteomyelitis of the plantar lateral aspect of the fifth metatarsal head. Chest x-ray done on 01/14/2015 shows bronchiectatic changes without infiltrate. EKG done on generally 17 2017 shows a normal sinus rhythm and is a normal  EKG. [name], [name] ([id number]) 02/04/2015 -- he was asked to see Dr. [name] last week and had 2 appointments but had  to cancel both due to pressures of work. Last night he has woken up with severe pain in the foot and leg and it is swollen up. No fever or no change in his blood glucose. Addendum: I spoke with Dr. [name] who kindly agreed to accept the patient for inpatient therapy and have also opened to the hospitalist Dr. [name], and discuss details of the management including PICC line and repeat cultures. 02/12/2015-- -- was seen by Dr. [name] in the hospital and a PICC line was placed. He was to receive Ceftazidime 2 g every 12 hourly, oral levofloxacin 750 mg every 24 hourly and oral fluconazole 200 mg daily. The antibiotics were to be given for 4 weeks except the Diflucan was to be given for the first 2 weeks. Reviewed note from 02/10/2015 -- and Dr. [name] had recommended management for growth of MSSA and Serratia. He switched him from ceftazidime to ceftriaxone 2 g every 24 hours. Levofloxacin was stopped and he would continue on fluconazole for another week. He had asked me to decide whether further imaging was necessary and whether surgical debridement of the infected bone was needed. He is doing well and has been off work for this week and we will keep him off the next week. 02/22/2015 -- he was seen by my colleague on 01/19/2015 and at that time an incision and drainage was done on his right lateral forefoot on the dorsum. Today when I probed this wound it is frankly draining pus and it communicates with the ulcer on the plantar aspect of his right foot. The patient is still on IV ceftriaxone 2 g every 24 hours and is to be seen by Dr. [name] on Friday. 03/19/2015 -- On 03/04/2015 I spoke to Dr. Celesta Gentile who saw him in the office today and did an x-ray of his right foot and noted that there was osteomyelitis of the right fifth toe and metatarsal and a  lot of pus draining from the wound. He recommended operative debridement which would probably result in the fifth metatarsal head and toe amputation.The patient would be referred back to [place] once he was done with surgery. He was admitted to [facility] yesterday and had surgery done by podiatry for a right fifth metatarsal acute osteomyelitis with cellulitis and abscess. He had a right foot incision and drainage with fifth metatarsal partial amputation and removal of toe infected bone and soft tissue with cultures. The wound was partially closed and packing of the distal end was done. Patient was already on cefepime 2 g IV every 8 hourly and put on vancomycin pending final cultures. He had grown moderate gram-negative rods, and later found to be rare diphtheroids. I received a call from Dr. Cannon Kettle the podiatrist and we discussed the above. On 03/10/2015 he was found positive for influenza a and has been put on Tamiflu. Since his discharge he has been seen by Dr. Cannon Kettle who is planning to remove his sutures this coming week. He was reviewed by Dr. [name] on 03/17/2015 and his Diflucan was stopped and vancomycin. After the 3 doses he is taking. He is going to change Ceftazidime to Zosyn 3.375 g IV every 8 hours. 03/25/2015 -- he was seen by the podiatrist a couple of days ago and the sutures were removed. She will follow back with him in 4 weeks' time and at that time x-rays will be taken and a custom molded insert would be made for his shoe. Electronic Signature(s) Signed: 03/25/2015 2:46:46 [name] By:  [name] Fudge MD, FACS Entered By: [name] on 03/25/2015 14:46:45 [name] ([id number]) -------------------------------------------------------------------------------- Physical Exam Details [name], [name] 03/25/2015 2:15 Patient Name: Date of Service: [name] Medical Record Patient Account Number: [id number] Number: Treating RN: [name] Date of  Birth/Sex: Jan 20, 1965 (50 y.o. Male) Other Clinician: Primary Care Physician: [name] Treating [name] Referring Physician: [name] Physician/Extender: Weeks in Treatment: 11 Constitutional . Pulse regular. Respirations normal and unlabored. Afebrile. . Eyes Nonicteric. Reactive to light. Ears, Nose, Mouth, and Throat Lips, teeth, and gums WNL.Marland Kitchen Moist mucosa without lesions. Neck supple and nontender. No palpable supraclavicular or cervical adenopathy. Normal sized without goiter. Respiratory WNL. No retractions.. Cardiovascular Pedal Pulses WNL. No clubbing, cyanosis or edema. Lymphatic No adneopathy. No adenopathy. No adenopathy. Musculoskeletal Adexa without tenderness or enlargement.. Digits and nails w/o clubbing, cyanosis, infection, petechiae, ischemia, or inflammatory conditions.. Integumentary (Hair, Skin) No suspicious lesions. No crepitus or fluctuance. No peri-wound warmth or erythema. No masses.Marland Kitchen Psychiatric Judgement and insight Intact.. No evidence of depression, anxiety, or agitation.. Notes the sutures have been removed and his wound is healed well and the most distal part of this wound has an open area with clean granulation tissue and we'll use Prisma AG on this. Electronic Signature(s) Signed: 03/25/2015 3:07:28 [name] By: [name] Fudge MD, FACS Entered By: [name] on 03/25/2015 15:07:27 [name] ([id number]) -------------------------------------------------------------------------------- Physician Orders Details [name], [name] 03/25/2015 2:15 Patient Name: Date of Service: [name] Medical Record Patient Account Number: [id number] Number: Treating RN: [name] Date of Birth/Sex: 30-Dec-1965 (50 y.o. Male) Other Clinician: Primary Care Physician: [name] Treating Kamdyn Colborn Referring Physician: [name] Physician/Extender: Weeks in Treatment: 11 Verbal / Phone Orders:  Yes Clinician: Montey [name] Read Back and Verified: Yes Diagnosis Coding ICD-10 Coding Code Description E11.621 Type 2 diabetes mellitus with foot ulcer L97.512 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d L84 Corns and callosities L02.611 Cutaneous abscess of right foot M86.371 Chronic multifocal osteomyelitis, right ankle and foot Wound Cleansing Wound #2 Right,Lateral Metatarsal head fifth o Clean wound with Normal Saline. Anesthetic Wound #2 Right,Lateral Metatarsal head fifth o Topical Lidocaine 4% cream applied to wound bed prior to debridement Primary Wound Dressing Wound #2 Right,Lateral Metatarsal head fifth o Prisma Ag - wet first with normal saline Secondary Dressing Wound #2 Right,Lateral Metatarsal head fifth o Gauze, ABD and Kerlix/Conform Dressing Change Frequency Wound #2 Right,Lateral Metatarsal head fifth o Change dressing every other day. Follow-up Appointments Wound #2 Right,Lateral Metatarsal head fifth o Return Appointment in 1 week. PARMVEER, URBANEK ([id number]) Off-Loading Wound #2 Right,Lateral Metatarsal head fifth o Open toe surgical shoe with peg assist. - front offload Medications-please add to medication list. Wound #2 Right,Lateral Metatarsal head fifth o Other: - continue IV antibiotics Electronic Signature(s) Signed: 03/25/2015 3:56:22 [name] By: [name] Fudge MD, FACS Signed: 03/25/2015 5:19:20 [name] By: [name] Entered By: [name] on 03/25/2015 15:01:20 [name] ([id number]) -------------------------------------------------------------------------------- Problem List Details [name], [name] 03/25/2015 2:15 Patient Name: Date of Service: [name] Medical Record Patient Account Number: [id number] Number: Treating RN: [name] Date of Birth/Sex: May 05, 1965 (50 y.o. 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 of other part of right foot with 01/07/2015 Yes fat layer exposed L84  Corns and callosities 01/07/2015 Yes L02.611 Cutaneous abscess of right foot 01/14/2015 Yes M86.371 Chronic multifocal osteomyelitis, right ankle and foot 01/28/2015 Yes Inactive Problems Resolved Problems Electronic Signature(s) Signed: 03/25/2015 2:47:03 [name] By: [name] Fudge MD, FACS Entered By: [name] on 03/25/2015 14:47:03 [name] ([id number]) -------------------------------------------------------------------------------- Progress Note Details [name], [name] 03/25/2015 2:15 Patient Name: Date of Service: [name] Medical Record Patient Account Number: [id number] Number: Treating RN: [name] Date of Birth/Sex: 1965/03/12 (50 y.o. Male) Other Clinician: Primary Care Physician: [name] Treating Merrit Friesen Referring Physician: [name] Physician/Extender: Weeks in Treatment: 11 Subjective Chief Complaint Information obtained from Patient Patients presents for treatment of an open diabetic ulcer on the plantar aspect of the right foot which she's had for about 4 weeks History of Present Illness (HPI) The following HPI elements were documented for the patient's wound: Location: plantar aspect of the right forefoot Quality: Patient reports No Pain. Severity: Patient states wound are getting worse. Duration: Patient has had the wound for < 4 weeks prior to presenting for treatment Timing: he has minimal discharge from the wound Context: The wound appeared gradually over time Modifying Factors: Other treatment(s) tried include:plan local care but not offloading Associated Signs and Symptoms: Patient  reports having:no pain or discharge from the wound. This 50 year old male comes with an ulcerated area on the plantar aspect of the right foot which she's had for approximately a month. I have known him from a previous visit at [facility] wound center and was treated in the months of April and May 2016 and rapidly healed a left plantar ulcer with a total contact cast. He has been a diabetic for about 16 years and tries to keep active and is fairly compliant with his diabetes management. 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 Past medical history significant for hypertension, hyperlipidemia, and status post appendectomy 1993. He does not smoke or drink alcohol. 01/14/2015 -- the patient had tolerated his total contact cast very well and had no problems and has had no systemic symptoms. However when his total contact cast was cut open he had excessive amount of purulent drainage in spite of being on antibiotics. He had had a recent x-ray done in the ER 12/22/2014 which showed IMPRESSION:No evidence of osseous erosion. Known soft tissue ulceration is not well characterized on radiograph. Scattered vascular calcifications seen. his last hemoglobin A1c in December was 7.3. He has been on Augmentin and doxycycline for the last 2 weeks. 01/21/2015 -- his culture grew rare growth of Pantoea species an MR moderate growth of Candida parapsilosis. it is sensitive to levofloxacin. He has not heard back from the insurance company regarding his hyperbaric oxygen therapy. [name], [name] ([id number]) His MRI has not been done yet and we will try and get him an earlier date 01/28/2015 -- MRI was done last night -- IMPRESSION:1. Soft tissue ulcer overlying the plantar aspect of the fifth metatarsal head extending to the cortex. Subcortical marrow edema in the fifth metatarsal head with corresponding T1 hypointensity is concerning for early osteomyelitis of the plantar lateral aspect of the fifth  metatarsal head. Chest x-ray done on 01/14/2015 shows bronchiectatic changes without infiltrate. EKG done on generally 17 2017 shows a normal sinus rhythm and is a normal EKG. 02/04/2015 -- he was asked to see Dr. [name] last week and had 2 appointments but had to cancel both due to pressures of work. Last night he has woken up with severe pain in the foot and leg and  it is swollen up. No fever or no change in his blood glucose. Addendum: I spoke with Dr. [name] who kindly agreed to accept the patient for inpatient therapy and have also opened to the hospitalist Dr. [name], and discuss details of the management including PICC line and repeat cultures. 02/12/2015-- -- was seen by Dr. [name] in the hospital and a PICC line was placed. He was to receive Ceftazidime 2 g every 12 hourly, oral levofloxacin 750 mg every 24 hourly and oral fluconazole 200 mg daily. The antibiotics were to be given for 4 weeks except the Diflucan was to be given for the first 2 weeks. Reviewed note from 02/10/2015 -- and Dr. [name] had recommended management for growth of MSSA and Serratia. He switched him from ceftazidime to ceftriaxone 2 g every 24 hours. Levofloxacin was stopped and he would continue on fluconazole for another week. He had asked me to decide whether further imaging was necessary and whether surgical debridement of the infected bone was needed. He is doing well and has been off work for this week and we will keep him off the next week. 02/22/2015 -- he was seen by my colleague on 01/19/2015 and at that time an incision and drainage was done on his right lateral forefoot on the dorsum. Today when I probed this wound it is frankly draining pus and it communicates with the ulcer on the plantar aspect of his right foot. The patient is still on IV ceftriaxone 2 g every 24 hours and is to be seen by Dr. [name] on Friday. 03/19/2015 -- On 03/04/2015 I spoke to Dr. Celesta Gentile who saw him  in the office today and did an x-ray of his right foot and noted that there was osteomyelitis of the right fifth toe and metatarsal and a lot of pus draining from the wound. He recommended operative debridement which would probably result in the fifth metatarsal head and toe amputation.The patient would be referred back to [place] once he was done with surgery. He was admitted to [facility] yesterday and had surgery done by podiatry for a right fifth metatarsal acute osteomyelitis with cellulitis and abscess. He had a right foot incision and drainage with fifth metatarsal partial amputation and removal of toe infected bone and soft tissue with cultures. The wound was partially closed and packing of the distal end was done. Patient was already on cefepime 2 g IV every 8 hourly and put on vancomycin pending final cultures. He had grown moderate gram-negative rods, and later found to be rare diphtheroids. I received a call from Dr. Cannon Kettle the podiatrist and we discussed the above. On 03/10/2015 he was found positive for influenza a and has been put on Tamiflu. Since his discharge he has been seen by Dr. Cannon Kettle who is planning to remove his sutures this coming week. He was reviewed by Dr. [name] on 03/17/2015 and his Diflucan was stopped and vancomycin. After the 3 doses he is taking. He is going to change Ceftazidime to Zosyn 3.375 g IV every 8 hours. 03/25/2015 -- he was seen by the podiatrist a couple of days ago and the sutures were removed. She will follow back with him in 4 weeks' time and at that time x-rays will be taken and a custom molded insert would be made for his shoe. [name], [name] ([id number]) Objective Constitutional Pulse regular. Respirations normal and unlabored. Afebrile. Vitals Time Taken: 2:23 [name], Height: 74 in, Weight: 288 lbs, BMI: 37, Temperature: 98.4 F, Pulse:  91 bpm, Respiratory Rate: 18 breaths/min, Blood Pressure: 140/68 mmHg. Eyes Nonicteric.  Reactive to light. Ears, Nose, Mouth, and Throat Lips, teeth, and gums WNL.Marland Kitchen Moist mucosa without lesions. Neck supple and nontender. No palpable supraclavicular or cervical adenopathy. Normal sized without goiter. Respiratory WNL. No retractions.. Cardiovascular Pedal Pulses WNL. No clubbing, cyanosis or edema. Lymphatic No adneopathy. No adenopathy. No adenopathy. Musculoskeletal Adexa without tenderness or enlargement.. Digits and nails w/o clubbing, cyanosis, infection, petechiae, ischemia, or inflammatory conditions.Marland Kitchen Psychiatric Judgement and insight Intact.. No evidence of depression, anxiety, or agitation.. General Notes: the sutures have been removed and his wound is healed well and the most distal part of this wound has an open area with clean granulation tissue and we'll use Prisma AG on this. Integumentary (Hair, Skin) No suspicious lesions. No crepitus or fluctuance. No peri-wound warmth or erythema. No masses.. Wound #2 status is Open. Original cause of wound was Bump. The wound is located on the Right,Lateral Metatarsal head fifth. The wound measures 2.5cm length x 0.8cm width x 0.1cm depth; 1.571cm^2 area and 0.157cm^3 volume. The wound is limited to skin breakdown. There is no tunneling or undermining [name], [name]. ([id number]) noted. There is a large amount of serosanguineous drainage noted. The wound margin is flat and intact. There is large (67-100%) red granulation within the wound bed. There is a small (1-33%) amount of necrotic tissue within the wound bed including Adherent Slough. The periwound skin appearance exhibited: Moist, Erythema. The periwound skin appearance did not exhibit: Callus, Crepitus, Excoriation, Fluctuance, Friable, Induration, Localized Edema, Rash, Scarring, Dry/Scaly, Maceration, Atrophie Blanche, Cyanosis, Ecchymosis, Hemosiderin Staining, Mottled, Pallor, Rubor. The surrounding wound skin color is noted with erythema which is  circumferential. Periwound temperature was noted as Hot. The periwound has tenderness on palpation. 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 Anesthetic: Wound #2 Right,Lateral Metatarsal head fifth: Topical Lidocaine 4% cream applied to wound bed prior to debridement Primary Wound Dressing: Wound #2 Right,Lateral Metatarsal head fifth: Prisma Ag - wet first with normal saline Secondary Dressing: Wound #2 Right,Lateral Metatarsal head fifth: Gauze, ABD and Kerlix/Conform Dressing Change Frequency: Wound #2 Right,Lateral Metatarsal head fifth: Change dressing every other day. Follow-up Appointments: Wound #2 Right,Lateral Metatarsal head fifth: Return Appointment in 1 week. Off-Loading: Wound #2 Right,Lateral Metatarsal head fifth: [name], KULHANEK ([id number]) Open toe surgical shoe with peg assist. - front offload Medications-please add to medication list.: Wound #2 Right,Lateral Metatarsal head fifth: Other: - continue IV antibiotics We awaiting clearance from his insurance company regarding continuing with hyperbaric oxygen therapy. he is still on his IV antibiotic and is going to be seeing Dr. [name] on Monday. I was asked him to continue with Prisma AG on his right forefoot, offload with a Darco front off loading shoe and see his podiatrist Dr. Cannon Kettle in 4 weeks for a custom insert. He will have wound care visit next week. Electronic Signature(s) Signed: 03/25/2015 3:08:28 [name] By: [name] Fudge MD, FACS Entered By: [name] on 03/25/2015 15:08:28 [name]  ([id number]) -------------------------------------------------------------------------------- SuperBill Details Patient Name: [name] Date of Service: 03/25/2015 Medical Record Number: [id number] Patient Account Number: [id number] Date of Birth/Sex: 1965/01/24 (50 y.o. Male)  Treating RN: [name] Primary Care Physician: [name] Other Clinician: Referring Physician: [name] Treating Physician/Extender: [name] in Treatment: 11 Diagnosis Coding ICD-10 Codes Code Description E11.621 Type 2 diabetes mellitus with foot ulcer L97.512 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d L84 Corns and callosities L02.611 Cutaneous abscess of right foot M86.371 Chronic multifocal osteomyelitis, right ankle and foot Facility Procedures CPT4 Code: [id number] Description: [address]9213 - WOUND CARE VISIT-LEV 3 EST PT Modifier: Quantity: 1 Physician Procedures CPT4 Code Description: [id number] 99213 - WC PHYS LEVEL 3 - EST PT ICD-10 Description Diagnosis 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

## 2015-03-26 NOTE — Progress Notes (Signed)
[name], [name] ([id number]) Visit Report for 03/24/2015 HBO Details [name] Date of Service: 03/24/2015 1:00 PM Patient Name: J. Patient Account Number: [id number] Medical Record Treating RN: [id number] Number: Other Clinician: Jacqulyn [name] Date of Birth/Sex: 1965-07-08 (50 y.o. Male) Treating [name] Primary Care Physician/Extender: [name] Physician: Referring Physician: Sharilyn [name] in Treatment: 10 HBO Treatment Course Details Treatment Course Ordering Physician: [name] 1 Number: HBO Treatment Start Date: 02/01/2015 Total Treatments 30 Ordered: HBO Indication: Diabetic Ulcer(s) of the Lower Extremity HBO Treatment Details Treatment Number: 30 Patient Type: Outpatient Chamber Type: Monoplace Chamber Serial #: [id number] Treatment Protocol: 2.0 ATA with 90 minutes oxygen, and no air breaks Treatment Details Compression Rate Down: 1.5 psi / minute De-Compression Rate Up: 1.5 psi / minute Air breaks and breathing Compress Tx Pressure periods Decompress Decompress Begins Reached (leave unused spaces Begins Ends blank) Chamber Pressure (ATA) 1 2 - - - - - - 2 1 Clock Time (24 hr) 13:01 13:13 - - - - - - 14:43 14:55 Treatment Length: 114 (minutes) Treatment Segments: 4 Capillary Blood Glucose Pre Capillary Blood Glucose (mg/dl): Post Capillary Blood Glucose (mg/dl): Vital Signs Capillary Blood Glucose Reference Range: 80 - 120 mg / dl HBO Diabetic Blood Glucose Intervention Range: <131 mg/dl or >249 mg/dl Time Vitals Blood Respiratory Capillary Blood Glucose Pulse Action Type: Pulse: Temperature: Taken: Pressure: Rate: Glucose (mg/dl): Meter #: Oximetry (%) Taken: Pre 12:49 122/72 96 18 98.3 188 1 none Post 14:59 140/64 72 18 98.2 152 1 none [name], [name] J. ([id number]) Treatment Response Treatment Completion Status: Treatment Completed without Adverse Event Physician Notes No concerns or treatment  given HBO Attestation I certify that I supervised this HBO treatment in accordance with Medicare guidelines. A trained Yes emergency response team is readily available per hospital policies and procedures. Continue HBOT as ordered. Yes Electronic Signature(s) Signed: 03/24/2015 4:33:06 PM By: [name] Ham MD Previous Signature: 03/24/2015 4:01:46 PM Version By: [name], [name] Entered By: [name] on 03/24/2015 16:04:05 [name] ([id number]) -------------------------------------------------------------------------------- HBO Safety Checklist Details [name] Date of Service: 03/24/2015 1:00 PM Patient Name: J. Patient Account Number: [id number] Medical Record Treating RN: [id number] Number: Other Clinician: Jacqulyn [name] Date of Birth/Sex: [birth year] (50 y.o. Male) Treating [name] Primary Care Physician/Extender: [name] Physician: Referring Physician: Sharilyn [name] in Treatment: 10 HBO Safety Checklist Items Safety Checklist Consent Form Signed Patient voided / foley secured and emptied When did you last eato 10:30 am Last dose of injectable or oral agent 07:00 am NA Ostomy pouch emptied and vented if applicable NA All implantable devices assessed, documented and approved Intravenous access site secured and place Valuables secured Linens and cotton and cotton/polyester blend (less than 51% polyester) Personal oil-based products / skin lotions / body lotions removed Wigs or hairpieces removed Smoking or tobacco materials removed Books / newspapers / magazines / loose paper removed Cologne, aftershave, perfume and deodorant removed Jewelry removed (may wrap wedding band) Make-up removed Hair care products removed Battery operated devices (external) removed Heating patches and chemical warmers removed NA Titanium eyewear removed NA Nail polish cured greater than 10 hours NA Casting  material cured greater than 10 hours NA Hearing aids removed NA Loose dentures or partials removed NA Prosthetics have been removed Patient demonstrates correct use of air break device (if applicable) Patient concerns have been addressed Patient grounding bracelet on and cord attached to chamber Specifics for Inpatients (complete in addition to above) Medication sheet  sent with patient [name] ([id number]) Intravenous medications needed or due during therapy sent with patient Drainage tubes (e.g. nasogastric tube or chest tube secured and vented) Endotracheal or Tracheotomy tube secured Cuff deflated of air and inflated with saline Airway suctioned Electronic Signature(s) Signed: 03/24/2015 4:01:46 PM By: [name] RCP, RRT, [name] Entered By: [name] on 03/24/2015 13:15:13

## 2015-03-26 NOTE — Progress Notes (Signed)
[name], [name] ([id number]) Visit Report for 03/23/2015 HBO Details [name] Date of Service: 03/23/2015 1:00 PM Patient Name: J. Patient Account Number: [id number] Medical Record Treating RN: [id number] Number: Other Clinician: Jacqulyn [name] Date of Birth/Sex: 23-Jul-1965 (50 y.o. Male) Treating [name] Primary Care Physician/Extender: [name] Physician: Referring Physician: Sharilyn [name] in Treatment: 10 HBO Treatment Course Details Treatment Course Ordering Physician: [name] 1 Number: HBO Treatment Start Date: 02/01/2015 Total Treatments 30 Ordered: HBO Indication: Diabetic Ulcer(s) of the Lower Extremity HBO Treatment Details Treatment Number: 29 Patient Type: Outpatient Chamber Type: Monoplace Chamber Serial #: [id number] Treatment Protocol: 2.0 ATA with 90 minutes oxygen, and no air breaks Treatment Details Compression Rate Down: 1.5 psi / minute De-Compression Rate Up: 1.5 psi / minute Air breaks and breathing Compress Tx Pressure periods Decompress Decompress Begins Reached (leave unused spaces Begins Ends blank) Chamber Pressure (ATA) 1 2 - - - - - - 2 1 Clock Time (24 hr) 13:01 13:13 - - - - - - 14:43 14:53 Treatment Length: 112 (minutes) Treatment Segments: 4 Capillary Blood Glucose Pre Capillary Blood Glucose (mg/dl): Post Capillary Blood Glucose (mg/dl): Vital Signs Capillary Blood Glucose Reference Range: 80 - 120 mg / dl HBO Diabetic Blood Glucose Intervention Range: <131 mg/dl or >249 mg/dl Time Vitals Blood Respiratory Capillary Blood Glucose Pulse Action Type: Pulse: Temperature: Taken: Pressure: Rate: Glucose (mg/dl): Meter #: Oximetry (%) Taken: Pre 12:51 132/80 96 18 97.9 188 1 none Post 14:59 144/70 78 18 98.2 173 1 none [name], [name] ([id number]) Treatment Response Treatment Completion Status: Treatment Completed without Adverse Event Electronic Signature(s) Signed: 03/23/2015 3:42:00  PM By: [name] RCP, RRT, CHT Signed: 03/24/2015 4:33:06 PM By: [name] Ham MD Entered By: [name] on 03/23/2015 15:40:21 [name] ([id number]) -------------------------------------------------------------------------------- HBO Safety Checklist Details [name] Date of Service: 03/23/2015 1:00 PM Patient Name: J. Patient Account Number: [id number] Medical Record Treating RN: [id number] Number: Other Clinician: Jacqulyn [name] Date of Birth/Sex: October 16, 1965 (50 y.o. Male) Treating [name] Primary Care Physician/Extender: [name] Physician: Referring Physician: Sharilyn [name] in Treatment: 10 HBO Safety Checklist Items Safety Checklist Consent Form Signed Patient voided / foley secured and emptied When did you last eato 10:45 am Last dose of injectable or oral agent 07:00 am NA Ostomy pouch emptied and vented if applicable NA All implantable devices assessed, documented and approved Intravenous access site secured and place Valuables secured Linens and cotton and cotton/polyester blend (less than 51% polyester) Personal oil-based products / skin lotions / body lotions removed Wigs or hairpieces removed Smoking or tobacco materials removed Books / newspapers / magazines / loose paper removed Cologne, aftershave, perfume and deodorant removed Jewelry removed (may wrap wedding band) Make-up removed Hair care products removed Battery operated devices (external) removed Heating patches and chemical warmers removed NA Titanium eyewear removed NA Nail polish cured greater than 10 hours NA Casting material cured greater than 10 hours NA Hearing aids removed NA Loose dentures or partials removed NA Prosthetics have been removed Patient demonstrates correct use of air break device (if applicable) Patient concerns have been addressed Patient grounding bracelet on and cord attached to  chamber Specifics for Inpatients (complete in addition to above) Medication sheet sent with patient [name], [name] ([id number]) Intravenous medications needed or due during therapy sent with patient Drainage tubes (e.g. nasogastric tube or chest tube secured and vented) Endotracheal or Tracheotomy tube secured Cuff deflated of air and inflated with saline Airway  suctioned Electronic Signature(s) Signed: 03/23/2015 3:42:00 PM By: [name] RCP, RRT, CHT Entered By: [name] on 03/23/2015 13:05:32

## 2015-03-27 NOTE — Progress Notes (Signed)
[name], [name] ([id number]) Visit Report for 03/25/2015 Arrival Information Details Patient Name: [name], [name]. Date of Service: 03/25/2015 2:15 PM Medical Record Number: [id number] Patient Account Number: [id number] Date of Birth/Sex: [birth year] (50 y.o. Male) Treating RN: Montey Hora Primary Care Physician: [name] Other Clinician: Referring Physician: [name] Treating Physician/Extender: Frann Rider in Treatment: 11 Visit Information History Since Last Visit Added or deleted any medications: No Patient Arrived: Ambulatory Any new allergies or adverse reactions: No Arrival Time: 14:22 Had a fall or experienced change in No Accompanied By: self activities of daily living that may affect Transfer Assistance: None risk of falls: Patient Identification Verified: Yes Signs or symptoms of abuse/neglect since last No Secondary Verification Process Yes visito Completed: Hospitalized since last visit: No Patient Requires Transmission-Based No Pain Present Now: No Precautions: Patient Has Alerts: Yes Electronic Signature(s) Signed: 03/25/2015 5:19:20 PM By: Montey Hora Entered By: Montey Hora on 03/25/2015 14:22:15 [name] ([id number]) -------------------------------------------------------------------------------- Clinic Level of Care Assessment Details Patient Name: [name] Date of Service: 03/25/2015 2:15 PM Medical Record Number: [id number] Patient Account Number: [id number] Date of Birth/Sex: [birth year] (50 y.o. Male) Treating RN: Montey Hora Primary Care Physician: [name] Other Clinician: Referring Physician: [name] Treating Physician/Extender: Frann Rider in Treatment: 11 Clinic Level of Care Assessment Items TOOL 4 Quantity Score []  - Use when only an EandM is performed on FOLLOW-UP visit 0 ASSESSMENTS - Nursing Assessment / Reassessment X - Reassessment of  Co-morbidities (includes updates in patient status) 1 10 X - Reassessment of Adherence to Treatment Plan 1 5 ASSESSMENTS - Wound and Skin Assessment / Reassessment X - Simple Wound Assessment / Reassessment - one wound 1 5 []  - Complex Wound Assessment / Reassessment - multiple wounds 0 []  - Dermatologic / Skin Assessment (not related to wound area) 0 ASSESSMENTS - Focused Assessment []  - Circumferential Edema Measurements - multi extremities 0 []  - Nutritional Assessment / Counseling / Intervention 0 X - Lower Extremity Assessment (monofilament, tuning fork, pulses) 1 5 []  - Peripheral Arterial Disease Assessment (using hand held doppler) 0 ASSESSMENTS - Ostomy and/or Continence Assessment and Care []  - Incontinence Assessment and Management 0 []  - Ostomy Care Assessment and Management (repouching, etc.) 0 PROCESS - Coordination of Care X - Simple Patient / Family Education for ongoing care 1 15 []  - Complex (extensive) Patient / Family Education for ongoing care 0 []  - Staff obtains Programmer, systems, Records, Test Results / Process Orders 0 []  - Staff telephones HHA, Nursing Homes / Clarify orders / etc 0 []  - Routine Transfer to another Facility (non-emergent condition) 0 CURLIE, KOZA ([id number]) []  - Routine Hospital Admission (non-emergent condition) 0 []  - New Admissions / Biomedical engineer / Ordering NPWT, Apligraf, etc. 0 []  - Emergency Hospital Admission (emergent condition) 0 X - Simple Discharge Coordination 1 10 []  - Complex (extensive) Discharge Coordination 0 PROCESS - Special Needs []  - Pediatric / Minor Patient Management 0 []  - Isolation Patient Management 0 []  - Hearing / Language / Visual special needs 0 []  - Assessment of Community assistance (transportation, D/C planning, etc.) 0 []  - Additional assistance / Altered mentation 0 []  - Support Surface(s) Assessment (bed, cushion, seat, etc.) 0 INTERVENTIONS - Wound Cleansing / Measurement X - Simple Wound  Cleansing - one wound 1 5 []  - Complex Wound Cleansing - multiple wounds 0 X - Wound Imaging (photographs - any number of wounds) 1 5 []  - Wound Tracing (instead of photographs) 0 X -  Simple Wound Measurement - one wound 1 5 []  - Complex Wound Measurement - multiple wounds 0 INTERVENTIONS - Wound Dressings X - Small Wound Dressing one or multiple wounds 1 10 []  - Medium Wound Dressing one or multiple wounds 0 []  - Large Wound Dressing one or multiple wounds 0 []  - Application of Medications - topical 0 []  - Application of Medications - injection 0 INTERVENTIONS - Miscellaneous []  - External ear exam 0 MARTINJR, LUCKER ([id number]) []  - Specimen Collection (cultures, biopsies, blood, body fluids, etc.) 0 []  - Specimen(s) / Culture(s) sent or taken to Lab for analysis 0 []  - Patient Transfer (multiple staff / Harrel Lemon Lift / Similar devices) 0 []  - Simple Staple / Suture removal (25 or less) 0 []  - Complex Staple / Suture removal (26 or more) 0 []  - Hypo / Hyperglycemic Management (close monitor of Blood Glucose) 0 []  - Ankle / Brachial Index (ABI) - do not check if billed separately 0 X - Vital Signs 1 5 Has the patient been seen at the hospital within the last three years: Yes Total Score: 80 Level Of Care: New/Established - Level 3 Electronic Signature(s) Signed: 03/25/2015 5:19:20 PM By: Montey Hora Entered By: Montey Hora on 03/25/2015 15:01:47 [name] ([id number]) -------------------------------------------------------------------------------- Encounter Discharge Information Details Patient Name: [name] Date of Service: 03/25/2015 2:15 PM Medical Record Number: [id number] Patient Account Number: [id number] Date of Birth/Sex: 03-20-1965 (50 y.o. Male) Treating RN: Montey Hora Primary Care Physician: [name] Other Clinician: Referring Physician: [name] Treating Physician/Extender: Frann Rider in Treatment:  11 Encounter Discharge Information Items Discharge Pain Level: 0 Discharge Condition: Stable Ambulatory Status: Ambulatory Discharge Destination: Home Transportation: Private Auto Accompanied By: self Schedule Follow-up Appointment: Yes Medication Reconciliation completed No and provided to Patient/Care Goldy Calandra: Patient Clinical Summary of Care: Declined Electronic Signature(s) Signed: 03/25/2015 4:27:37 PM By: Montey Hora Previous Signature: 03/25/2015 3:09:45 PM Version By: Ruthine Dose Entered By: Montey Hora on 03/25/2015 16:27:37 [name] ([id number]) -------------------------------------------------------------------------------- Lower Extremity Assessment Details Patient Name: [name] Date of Service: 03/25/2015 2:15 PM Medical Record Number: [id number] Patient Account Number: [id number] Date of Birth/Sex: 09/21/1965 (50 y.o. Male) Treating RN: Montey Hora Primary Care Physician: [name] Other Clinician: Referring Physician: [name] Treating Physician/Extender: Frann Rider in Treatment: 11 Vascular Assessment Pulses: Posterior Tibial Dorsalis Pedis Palpable: [Right:Yes] Extremity colors, hair growth, and conditions: Extremity Color: [Right:Normal] Hair Growth on Extremity: [Right:Yes] Temperature of Extremity: [Right:Warm] Capillary Refill: [Right:< 3 seconds] Electronic Signature(s) Signed: 03/25/2015 4:19:19 PM By: Montey Hora Entered By: Montey Hora on 03/25/2015 16:19:19 [name] ([id number]) -------------------------------------------------------------------------------- Multi Wound Chart Details Patient Name: [name] Date of Service: 03/25/2015 2:15 PM Medical Record Number: [id number] Patient Account Number: [id number] Date of Birth/Sex: Jan 24, 1965 (50 y.o. 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] Comorbid History: [2:Hypertension, Type II Diabetes, Neuropathy] [N/A:N/A] Date Acquired: [2:02/19/2015] [N/A:N/A] Weeks of Treatment: [2:4] [N/A:N/A] Wound Status: [2:Open] [N/A:N/A] Measurements L x W x D 2.5x0.8x0.1 [N/A:N/A] (cm) Area (cm) : [2:1.571] [N/A:N/A] Volume (cm) : [2:0.157] [N/A:N/A] Classification: [2:Grade 3] [N/A:N/A] Wagner Verification: [2:Abscess] [N/A:N/A]  Exudate Amount: [2:Large] [N/A:N/A] Exudate Type: [2:Serosanguineous] [N/A:N/A] Exudate Color: [2:red, brown] [N/A:N/A] Wound Margin: [2:Flat and Intact] [N/A:N/A] Granulation Amount: [2:Large (67-100%)] [N/A:N/A] Granulation Quality: [2:Red] [N/A:N/A] Necrotic Amount: [2:Small (1-33%)] [N/A:N/A] Exposed Structures: [2:Fascia: No Fat: No Tendon: No Muscle: No Joint: No Bone: No] [N/A:N/A] Limited to Skin Breakdown Epithelialization: None N/A N/A Periwound Skin Texture: Edema: No N/A N/A Excoriation: No Induration: No Callus: No Crepitus: No Fluctuance: No Friable: No Rash: No Scarring: No Periwound Skin Moist: Yes N/A N/A Moisture: Maceration: No Dry/Scaly: No Periwound Skin Color: Erythema: Yes N/A N/A Atrophie Blanche: No Cyanosis: No Ecchymosis: No Hemosiderin Staining: No Mottled: No Pallor: No Rubor: No Erythema Location: Circumferential N/A N/A Temperature: Hot N/A N/A Tenderness on Yes N/A N/A Palpation: Wound Preparation: Ulcer Cleansing: N/A N/A Rinsed/Irrigated with Saline Topical Anesthetic Applied:  Other: lidocaine 4% Treatment Notes Electronic Signature(s) Signed: 03/25/2015 5:19:20 PM By: Montey Hora Entered By: Montey Hora on 03/25/2015 14:57:14 [name] ([id number]) -------------------------------------------------------------------------------- [place] Details Patient Name: ADDAI, AFSHARI. Date of Service: 03/25/2015 2:15 PM Medical Record Number: [id number] Patient Account Number: [id number] Date of Birth/Sex: 05-May-1965 (50 y.o. Male) Treating RN: Montey Hora Primary Care Physician: [name] Other Clinician: Referring Physician: [name] Treating Physician/Extender: Frann Rider in Treatment: 11 Active Inactive HBO Nursing Diagnoses: Anxiety related to feelings of confinement associated with the hyperbaric oxygen chamber Anxiety related to knowledge deficit of hyperbaric oxygen therapy and treatment procedures Discomfort related to temperature and humidity changes inside hyperbaric chamber Potential for barotraumas to ears, sinuses, teeth, and lungs or cerebral gas embolism related to changes in atmospheric pressure inside hyperbaric oxygen chamber Potential for oxygen toxicity seizures related to delivery of 100% oxygen at an increased atmospheric pressure Potential for pulmonary oxygen toxicity related to delivery of 100% oxygen at an increased atmospheric pressure Goals: Barotrauma will be prevented during HBO2 Date Initiated: 01/07/2015 Goal Status: Active Patient and/or family will be able to state/discuss factors appropriate to the management of their disease process during treatment Date Initiated: 01/07/2015 Goal Status: Active Patient will tolerate the hyperbaric oxygen therapy treatment Date Initiated: 01/07/2015 Goal Status: Active Patient will tolerate the internal climate of the chamber Date Initiated: 01/07/2015 Goal Status: Active Patient/caregiver will verbalize understanding of HBO  goals, rationale, procedures and potential hazards Date Initiated: 01/07/2015 Goal Status: Active Signs and symptoms of pulmonary oxygen toxicity will be recognized and promptly addressed Date Initiated: 01/07/2015 Goal Status: Active Signs and symptoms of seizure will be recognized and promptly addressed ; seizing patients will suffer no harm Date Initiated: 01/07/2015 [name] ([id number]) Goal Status: Active Interventions: Administer a five (5) minute air break for patient if signs and symptoms of seizure appear and notify the hyperbaric physician Administer a ten (10) minute air break for patient if signs and symptoms of seizure appear and notify the hyperbaric physician Administer decongestants, per physician orders, prior to HBO2 Administer the correct therapeutic gas delivery based on the patients needs and limitations, per physician order Assess and provide for patientos comfort related to the hyperbaric environment and equalization of middle ear Assess for signs and symptoms related to adverse events, including but not limited to confinement anxiety, pneumothorax, oxygen toxicity and baurotrauma Assess patient for any history of confinement anxiety Assess patient's knowledge and expectations regarding hyperbaric medicine and provide education related to the hyperbaric environment, goals of treatment and prevention of adverse events Implement protocols to decrease risk of pneumothorax in high risk patients Notes: Orientation to the Wound Care  Program Nursing Diagnoses: Knowledge deficit related to the wound healing center program Goals: Patient/caregiver will verbalize understanding of the [facility] Date Initiated: 01/07/2015 Goal Status: Active Interventions: Provide education on orientation to the wound center Notes: Peripheral Neuropathy Nursing Diagnoses: Knowledge deficit related to disease process and management of peripheral neurovascular  dysfunction Potential alteration in peripheral tissue perfusion (select prior to confirmation of diagnosis) Goals: Patient/caregiver will verbalize understanding of disease process and disease management Date Initiated: 01/07/2015 Goal Status: Active DANTAE, LEISHER ([id number]) Interventions: Assess signs and symptoms of neuropathy upon admission and as needed Provide education on Management of Neuropathy and Related Ulcers Provide education on Management of Neuropathy upon discharge from the [facility] for HBO Treatment Activities: Consult for HBO : 03/25/2015 Patient referred for customized footwear/offloading : 03/25/2015 Notes: Wound/Skin Impairment Nursing Diagnoses: Impaired tissue integrity Knowledge deficit related to ulceration/compromised skin integrity Goals: Patient/caregiver will verbalize understanding of skin care regimen Date Initiated: 01/07/2015 Goal Status: Active Ulcer/skin breakdown will have a volume reduction of 30% by week 4 Date Initiated: 01/07/2015 Goal Status: Active Ulcer/skin breakdown will have a volume reduction of 50% by week 8 Date Initiated: 01/07/2015 Goal Status: Active Ulcer/skin breakdown will have a volume reduction of 80% by week 12 Date Initiated: 01/07/2015 Goal Status: Active Ulcer/skin breakdown will heal within 14 weeks Date Initiated: 01/07/2015 Goal Status: Active Interventions: Assess patient/caregiver ability to obtain necessary supplies Assess patient/caregiver ability to perform ulcer/skin care regimen upon admission and as needed Assess ulceration(s) every visit Provide education on ulcer and skin care Treatment Activities: Referred to DME Cassie Henkels for dressing supplies : 03/25/2015 Skin care regimen initiated : 03/25/2015 ABIEL, ODEH ([id number]) Topical wound management initiated : 03/25/2015 Notes: Electronic Signature(s) Signed: 03/25/2015 5:19:20 PM By: Montey Hora Entered By: Montey Hora on  03/25/2015 14:57:06 [name] ([id number]) -------------------------------------------------------------------------------- Patient/Caregiver Education Details Patient Name: [name] Date of Service: 03/25/2015 2:15 PM Medical Record Number: [id number] Patient Account Number: [id number] Date of Birth/Gender: October 17, 1965 (50 y.o. Male) Treating RN: Montey Hora Primary Care Physician: [name] Other Clinician: Referring Physician: [name] Treating Physician/Extender: Frann Rider in Treatment: 11 Education Assessment Education Provided To: Patient Education Topics Provided Wound/Skin Impairment: Handouts: Other: wound care as ordered Methods: Demonstration, Explain/Verbal Responses: State content correctly Electronic Signature(s) Signed: 03/25/2015 4:28:28 PM By: Montey Hora Entered By: Montey Hora on 03/25/2015 [id number] [name] ([id number]) -------------------------------------------------------------------------------- Wound Assessment Details Patient Name: [name] Date of Service: 03/25/2015 2:15 PM Medical Record Number: [id number] Patient Account Number: [id number] Date of Birth/Sex: Sep 02, 1965 (50 y.o. Male) Treating RN: Montey Hora Primary Care Physician: [name] Other Clinician: Referring Physician: [name] Treating Physician/Extender: Frann Rider in Treatment: 11 Wound Status Wound Number: 2 Primary Diabetic Wound/Ulcer of the Lower Etiology: Extremity Wound Location: Right Metatarsal head fifth - Lateral Wound Status: Open Wounding Event: Bump Comorbid Hypertension, Type II Diabetes, History: Neuropathy Date Acquired: 02/19/2015 Weeks Of Treatment: 4 Clustered Wound: No Photos Photo Uploaded By: Montey Hora on 03/25/2015 17:11:47 Wound Measurements Length: (cm) 2.5 Width: (cm) 0.8 Depth: (cm) 0.1 Area: (cm) 1.571 Volume: (cm) 0.157 % Reduction  in Area: % Reduction in Volume: Epithelialization: None Tunneling: No Undermining: No Wound Description Classification: Grade 3 Wagner Verification: Abscess Wound Margin: Flat and Intact Exudate Amount: Large Exudate Type: Serosanguineous Exudate Color: red, brown Foul Odor After Cleansing: No Wound Bed Granulation Amount: Large (67-100%) Exposed Structure Granulation Quality: Red Fascia Exposed: No RAFEEQ, BACH ([id number]) Necrotic Amount:  Small (1-33%) Fat Layer Exposed: No Necrotic Quality: Adherent Slough Tendon Exposed: No Muscle Exposed: No Joint Exposed: No Bone Exposed: No Limited to Skin Breakdown Periwound Skin Texture Texture Color No Abnormalities Noted: No No Abnormalities Noted: No Callus: No Atrophie Blanche: No Crepitus: No Cyanosis: No Excoriation: No Ecchymosis: No Fluctuance: No Erythema: Yes Friable: No Erythema Location: Circumferential Induration: No Hemosiderin Staining: No Localized Edema: No Mottled: No Rash: No Pallor: No Scarring: No Rubor: No Moisture Temperature / Pain No Abnormalities Noted: No Temperature: Hot Dry / Scaly: No Tenderness on Palpation: Yes Maceration: No Moist: Yes Wound Preparation Ulcer Cleansing: Rinsed/Irrigated with Saline Topical Anesthetic Applied: Other: lidocaine 4%, Treatment Notes Wound #2 (Right, Lateral Metatarsal head fifth) 1. Cleansed with: Clean wound with Normal Saline 2. Anesthetic Topical Lidocaine 4% cream to wound bed prior to debridement 4. Dressing Applied: Prisma Ag 5. Secondary Dressing Applied Gauze and Kerlix/Conform 7. Secured with Recruitment consultant) Signed: 03/25/2015 5:19:20 PM By: Montey Hora Entered By: Montey Hora on 03/25/2015 14:31:13 LITO, PENT ([id number]) [name] ([id number]) -------------------------------------------------------------------------------- Vitals Details Patient Name: [name] Date of Service: 03/25/2015 2:15 PM Medical Record Number: [id number] Patient Account Number: [id number] Date of Birth/Sex: Nov 11, 1965 (50 y.o. Male) Treating RN: Montey Hora Primary Care Physician: [name] Other Clinician: Referring Physician: [name] Treating Physician/Extender: Frann Rider in Treatment: 11 Vital Signs Time Taken: 14:23 Temperature (F): 98.4 Height (in): 74 Pulse (bpm): 91 Weight (lbs): 288 Respiratory Rate (breaths/min): 18 Body Mass Index (BMI): 37 Blood Pressure (mmHg): 140/68 Reference Range: 80 - 120 mg / dl Electronic Signature(s) Signed: 03/25/2015 5:19:20 PM By: Montey Hora Entered By: Montey Hora on 03/25/2015 14:24:40

## 2015-03-30 ENCOUNTER — Encounter: Payer: Managed Care, Other (non HMO) | Admitting: Internal Medicine

## 2015-03-30 DIAGNOSIS — E11621 Type 2 diabetes mellitus with foot ulcer: Secondary | ICD-10-CM | POA: Diagnosis not present

## 2015-03-30 LAB — GLUCOSE, CAPILLARY
Glucose-Capillary: 171 mg/dL — ABNORMAL HIGH (ref 65–99)
Glucose-Capillary: 113 mg/dL — ABNORMAL HIGH (ref 65–99)

## 2015-03-31 ENCOUNTER — Encounter: Payer: Managed Care, Other (non HMO) | Admitting: Internal Medicine

## 2015-03-31 DIAGNOSIS — E11621 Type 2 diabetes mellitus with foot ulcer: Secondary | ICD-10-CM | POA: Diagnosis not present

## 2015-03-31 LAB — GLUCOSE, CAPILLARY
Glucose-Capillary: 198 mg/dL — ABNORMAL HIGH (ref 65–99)
Glucose-Capillary: 136 mg/dL — ABNORMAL HIGH (ref 65–99)

## 2015-03-31 NOTE — Progress Notes (Signed)
DAMU, MADANI ([id number]) Visit Report for 03/30/2015 HBO Details [name] Date of Service: 03/30/2015 1:00 PM Patient Name: J. Patient Account Number: [id number] Medical Record Treating RN: [id number] Number: Other Clinician: [name] Date of Birth/Sex: March 15, 1965 (50 y.o. Male) Treating [name], [name] Primary Care Physician/Extender: Floyde Parkins Physician: Referring Physician: Sharilyn Sites in Treatment: 11 HBO Treatment Course Details Treatment Course Ordering Physician: Christin Fudge 1 Number: HBO Treatment Start Date: 02/01/2015 Total Treatments 60 Ordered: HBO Indication: Diabetic Ulcer(s) of the Lower Extremity Standard/Conservative Wound Care tried and failed greater than or equal to 30 days Wound #2 Right, Lateral Metatarsal head fifth HBO Treatment Details Treatment Number: 31 Patient Type: Outpatient Chamber Type: Monoplace Chamber Serial #: [id number] Treatment Protocol: 2.0 ATA with 90 minutes oxygen, and no air breaks Treatment Details Compression Rate Down: 1.5 psi / minute De-Compression Rate Up: 1.5 psi / minute Air breaks and breathing Compress Tx Pressure periods Decompress Decompress Begins Reached (leave unused spaces Begins Ends blank) Chamber Pressure (ATA) 1 2 - - - - - - 2 1 Clock Time (24 hr) 13:05 13:17 - - - - - - 14:47 14:58 Treatment Length: 113 (minutes) Treatment Segments: 4 Capillary Blood Glucose Pre Capillary Blood Glucose (mg/dl): Post Capillary Blood Glucose (mg/dl): Vital Signs Capillary Blood Glucose Reference Range: 80 - 120 mg / dl HBO Diabetic Blood Glucose Intervention Range: <131 mg/dl or >249 mg/dl Time Vitals Blood Respiratory Capillary Blood Glucose Pulse Action Type: Pulse: Temperature: Taken: Pressure: Rate: Glucose (mg/dl): Meter #: Oximetry (%) Taken: [name] ([id number]) Pre 12:55 148/80 84 18 98.5 171 1 none Post 15:01 142/72 72 18 98.3 113 1 none Treatment  Response Treatment Completion Status: Treatment Completed without Adverse Event Physician Notes No concerns with treatment given HBO Attestation I certify that I supervised this HBO treatment in accordance with Medicare guidelines. A trained Yes emergency response team is readily available per hospital policies and procedures. Continue HBOT as ordered. Yes Electronic Signature(s) Signed: 03/30/2015 6:33:23 PM By: Linton Ham MD Previous Signature: 03/30/2015 5:35:17 PM Version By: Becky Sax, Sallie RCP, RRT, CHT Entered By: Linton Ham on 03/30/2015 18:32:46 [name] -------------------------------------------------------------------------------- HBO Safety Checklist Details [name] Date of Service: 03/30/2015 1:00 PM Patient Name: J. Patient Account Number: [id number] Medical Record Treating RN: [id number] Number: Other Clinician: [name] Date of Birth/Sex: 08-May-1965 (50 y.o. Male) Treating [name], [name] Primary Care Physician/Extender: Floyde Parkins Physician: Referring Physician: Sharilyn Sites in Treatment: 11 HBO Safety Checklist Items Safety Checklist Consent Form Signed Patient voided / foley secured and emptied When did you last eato 11:00 am Last dose of injectable or oral agent 07:00 am NA Ostomy pouch emptied and vented if applicable NA All implantable devices assessed, documented and approved Intravenous access site secured and place Valuables secured Linens and cotton and cotton/polyester blend (less than 51% polyester) Personal oil-based products / skin lotions / body lotions removed Wigs or hairpieces removed Smoking or tobacco materials removed Books / newspapers / magazines / loose paper removed Cologne, aftershave, perfume and deodorant removed Jewelry removed (may wrap wedding band) Make-up removed Hair care products removed Battery operated devices (external)  removed Heating patches and chemical warmers removed NA Titanium eyewear removed NA Nail polish cured greater than 10 hours NA Casting material cured greater than 10 hours NA Hearing aids removed NA Loose dentures or partials removed NA Prosthetics have been removed Patient demonstrates correct use of air break device (if applicable) Patient concerns have been  addressed Patient grounding bracelet on and cord attached to chamber Specifics for Inpatients (complete in addition to above) Medication sheet sent with patient [name] ([id number]) Intravenous medications needed or due during therapy sent with patient Drainage tubes (e.g. nasogastric tube or chest tube secured and vented) Endotracheal or Tracheotomy tube secured Cuff deflated of air and inflated with saline Airway suctioned Electronic Signature(s) Signed: 03/30/2015 5:35:17 PM By: Lorine Bears RCP, RRT, CHT Entered By: Lorine Bears on 03/30/2015 13:14:10

## 2015-03-31 NOTE — Progress Notes (Signed)
DAVD, GLAZEBROOK ([id number]) Visit Report for 03/30/2015 Arrival Information Details [name] Date of Service: 03/30/2015 1:00 PM Patient Name: [name]. Patient Account Number: [id number] Medical Record Treating RN: [id number] Number: Other Clinician: [name] Date of Birth/Sex: May 04, 1965 (50 y.o. Male) Treating [name], [place] Primary Care Physician/Extender: Floyde Parkins Physician: Referring Physician: Sharilyn Sites in Treatment: 11 Visit Information History Since Last Visit Added or deleted any medications: No Patient Arrived: Ambulatory Any new allergies or adverse reactions: No Arrival Time: 12:50 Had a fall or experienced change in No Accompanied By: self activities of daily living that may affect Transfer Assistance: None risk of falls: Patient Identification Verified: Yes Signs or symptoms of abuse/neglect No Secondary Verification Process Yes since last visito Completed: Hospitalized since last visit: No Patient Requires Transmission-Based No Has Dressing in Place as Prescribed: Yes Precautions: Has Footwear/Offloading in Place as Yes Patient Has Alerts: Yes Prescribed: Left: Surgical Shoe with Pressure Relief Insole Pain Present Now: No Electronic Signature(s) Signed: 03/30/2015 5:35:17 PM By: Lorine Bears RCP, RRT, CHT Entered By: Becky Sax, Amado Nash on 03/30/2015 13:12:33 [name] ([id number]) -------------------------------------------------------------------------------- Encounter Discharge Information Details [name] Date of Service: 03/30/2015 1:00 PM Patient Name: [name]. Patient Account Number: [id number] Medical Record Treating RN: [id number] Number: Other Clinician: [name] Date of Birth/Sex: 10/30/1965 (50 y.o. Male) Treating [name], [name] Primary Care Physician/Extender: Floyde Parkins Physician: Referring Physician: Sharilyn Sites in Treatment:  11 Encounter Discharge Information Items Discharge Pain Level: 0 Discharge Condition: Stable Ambulatory Status: Ambulatory Discharge Destination: Home Private Transportation: Auto Accompanied By: self Schedule Follow-up Appointment: No Medication Reconciliation completed and No provided to Patient/Care Alayne Estrella: Clinical Summary of Care: Notes Patient has an HBO treatment scheduled on 03/31/15 at 13:00 pm. Electronic Signature(s) Signed: 03/30/2015 5:35:17 PM By: Lorine Bears RCP, RRT, CHT Entered By: Becky Sax, Amado Nash on 03/30/2015 15:34:13 [name] ([id number]) -------------------------------------------------------------------------------- Vitals Details [name] Date of Service: 03/30/2015 1:00 PM Patient Name: [name]. Patient Account Number: [id number] Medical Record Treating RN: [id number] Number: Other Clinician: [name] Date of Birth/Sex: 26-May-1965 (50 y.o. Male) Treating [name], [name] Primary Care Physician/Extender: Floyde Parkins Physician: Referring Physician: Sharilyn Sites in Treatment: 11 Vital Signs Time Taken: 12:55 Temperature (F): 98.5 Height (in): 74 Pulse (bpm): 84 Weight (lbs): 288 Respiratory Rate (breaths/min): 18 Body Mass Index (BMI): 37 Blood Pressure (mmHg): 148/80 Capillary Blood Glucose (mg/dl): 171 Reference Range: 80 - 120 mg / dl Electronic Signature(s) Signed: 03/30/2015 5:35:17 PM By: Lorine Bears RCP, RRT, CHT Entered By: Lorine Bears on 03/30/2015 13:13:18

## 2015-03-31 NOTE — Progress Notes (Signed)
DAVONTRE, ACKLAND ([id number]) Visit Report for 03/30/2015 Physician Orders Details [name] Date of Service: 03/30/2015 1:00 PM Patient Name: J. Patient Account Number: [id number] Medical Record Afful, RN, BSN, [id number] Treating RN: Number: Velva Harman Date of Birth/Sex: 07/31/1965 (50 y.o. Male) Other Clinician: [name] Primary Care Physician: [name] Treating Britto, Errol Referring Physician: [name] Physician/Extender: Suella Grove in Treatment: 11 Verbal / Phone Orders: Yes Clinician: Afful, RN, BSN, Rita Read Back and Verified: Yes Diagnosis Coding Blood Glucose Testing Wound #2 Right,Lateral Metatarsal head fifth o Finger stick in clinic as a component of the assessment of: o Provide patient a snack Hyperbaric Oxygen Therapy Wound #2 Right,Lateral Metatarsal head fifth o Indication: - Osteomyelitis o If appropriate for treatment, begin HBOT per protocol: o 2.0 ATA for 90 Minutes without Air Breaks o One treatment per day (delivered Monday through Friday unless otherwise specified in Special Instructions below): o Total # of Treatments: - 30 o Finger stick Blood Glucose Pre- and Post- HBOT Treatment. o Follow Hyperbaric Oxygen Glycemia Protocol HBO Contraindications Wound #2 Right,Lateral Metatarsal head fifth - Left Lower Extremity o HBO contraindications of hyperbaric oxygen therapy were reviewed and the patient found to have no untreated pneumothorax or history of spontaneous pneumothorax. o HBO contraindications of hyperbaric oxygen therapy were reviewed and the patient found to have no history of medications such as Bleomycin, Adriamycin, disulfiram, cisplatin and sulfamylon and is not currently receiving any chemotherapy. o HBO contraindications of hyperbaric oxygen therapy were reviewed and the patient found to have no Upper respiratory infection and chronic sinusitis. o HBO contraindications of hyperbaric oxygen  therapy were reviewed and the patient found to have no history of retinal surgery proceeding 6 weeks or intraocular gas o HBO contraindications of hyperbaric oxygen therapy were reviewed and the patient found to have no history seizure disorder or any anticonvulsant medication. o HBO contraindications of hyperbaric oxygen therapy were reviewed and the patient found to have no septicemia with CO2 retention. ALF, SMREKAR ([id number]) o HBO contraindications of hyperbaric oxygen therapy were reviewed and the patient found to have no fever greater than 100 degrees. o HBO contraindications of hyperbaric oxygen therapy were reviewed and the patient found to have no pregnancy noted. o HBO contraindications of hyperbaric oxygen therapy were reviewed and the patient found to have no medications such as steroids or narcotics or Phenergan. GLYCEMIA INTERVENTIONS PROTOCOL PRE-HBO GLYCEMIA INTERVENTIONS ACTION INTERVENTION Obtain pre-HBO capillary blood 1 glucose (ensure physician order is in chart). A. Notify HBO physician and await physician orders. 2 If result is 70 mg/dl or below: B. If the result meets the hospital definition of a critical result, follow hospital policy. A. Give patient an 8 ounce Glucerna Shake, an 8 ounce Ensure, or 8 ounces of a Glucerna/Ensure equivalent dietary supplement*. B. Wait 30 minutes. If result is 71 mg/dl to 130 mg/dl: C. Retest patientos capillary blood glucose (CBG). D. If result greater than or equal to 110 mg/dl, proceed with HBO. If result less than 110 mg/dl, notify HBO physician and consider holding HBO. If result is 131 mg/dl to 249 mg/dl: A. Proceed with HBO. A. Notify HBO physician and await physician orders. B. It is recommended to hold HBO and do blood/urine ketone If result is 250 mg/dl or greater: testing. C. If the result meets the hospital definition of a critical result, follow hospital policy. POST-HBO  GLYCEMIA INTERVENTIONS ACTION INTERVENTION Obtain post HBO capillary blood 1 glucose (ensure physician order is in chart). 2 If result is 70  mg/dl or below: A. Notify HBO physician and await physician orders. MCRAE, DRACE ([id number]) B. If the result meets the hospital definition of a critical result, follow hospital policy. A. Give patient an 8 ounce Glucerna Shake, an 8 ounce Ensure, or 8 ounces of a Glucerna/Ensure equivalent dietary supplement*. B. Wait 15 minutes for symptoms of hypoglycemia (i.e. nervousness, anxiety, If result is 71 mg/dl to 100 mg/dl: sweating, chills, clamminess, irritability, confusion, tachycardia or dizziness). C. If patient asymptomatic, discharge patient. If patient symptomatic, repeat capillary blood glucose (CBG) and notify HBO physician. If result is 101 mg/dl to 249 mg/dl: A. Discharge patient. A. Notify HBO physician and await physician orders. B. It is recommended to do If result is 250 mg/dl or greater: blood/urine ketone testing. C. If the result meets the hospital definition of a critical result, follow hospital policy. *Juice or candies are NOT equivalent products. If patient refuses the Glucerna or Ensure, please consult the hospital dietitian for an appropriate substitute. Electronic Signature(s) Signed: 03/30/2015 1:55:51 PM By: Regan Lemming BSN, RN Signed: 03/30/2015 5:26:12 PM By: Christin Fudge MD, FACS Entered By: Regan Lemming on 03/30/2015 13:55:50 [name] ([id number]) -------------------------------------------------------------------------------- Francisca December Date of Service: 03/30/2015 Patient Name: J. Patient Account Number: [id number] Medical Record Treating RN: [id number] Number: Other Clinician: [name] Date of Birth/Sex: 1965-07-19 (50 y.o. Male) Treating Emmett Bracknell Primary Care Physician/Extender: Floyde Parkins Physician: Weeks in Treatment:  11 Referring Physician: [name] Diagnosis Coding ICD-10 Codes Code Description E11.621 Type 2 diabetes mellitus with foot ulcer L97.512 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d L84 Corns and callosities L02.611 Cutaneous abscess of right foot M86.371 Chronic multifocal osteomyelitis, right ankle and foot Facility Procedures CPT4 Code: [id number] Description: (Facility Use Only) HBOT, full body chamber, 45min Modifier: Quantity: 4 Physician Procedures CPT4 Code Description: [id number] - WC PHYS HYPERBARIC OXYGEN THERAPY ICD-10 Description Diagnosis M86.371 Chronic multifocal osteomyelitis, right ankle and foot E11.621 Type 2 diabetes mellitus with foot ulcer L97.512 Non-pressure chronic ulcer of  other part of right foot L02.611 Cutaneous abscess of right foot Modifier: with fat lay Quantity: 1 er exposed Electronic Signature(s) Signed: 03/30/2015 5:35:17 PM By: Paulla Fore, RRT, CHT Signed: 03/30/2015 6:33:23 PM By: Linton Ham MD Entered By: Lorine Bears on 03/30/2015 14:58:51

## 2015-04-01 ENCOUNTER — Encounter: Payer: Managed Care, Other (non HMO) | Admitting: Surgery

## 2015-04-01 DIAGNOSIS — E11621 Type 2 diabetes mellitus with foot ulcer: Secondary | ICD-10-CM | POA: Diagnosis not present

## 2015-04-01 LAB — GLUCOSE, CAPILLARY
Glucose-Capillary: 225 mg/dL — ABNORMAL HIGH (ref 65–99)
Glucose-Capillary: 154 mg/dL — ABNORMAL HIGH (ref 65–99)

## 2015-04-01 NOTE — Progress Notes (Signed)
CALIX, MCNEISH ([id number]) Visit Report for 03/31/2015 HBO Details [name] Date of Service: 03/31/2015 1:30 PM Patient Name: J. Patient Account Number: [id number] Medical Record Treating RN: [id number] Number: Other Clinician: [name] Date of Birth/Sex: 1965-09-12 (50 y.o. Male) Treating [name] Primary Care Physician/Extender: Floyde Parkins Physician: Referring Physician: Sharilyn Sites in Treatment: 11 HBO Treatment Course Details Treatment Course Ordering Physician: Christin Fudge 1 Number: HBO Treatment Start Date: 02/01/2015 Total Treatments 60 Ordered: HBO Indication: Diabetic Ulcer(s) of the Lower Extremity Standard/Conservative Wound Care tried and failed greater than or equal to 30 days Wound #2 Right, Lateral Metatarsal head fifth HBO Treatment Details Treatment Number: 32 Patient Type: Outpatient Chamber Type: Monoplace Chamber Serial #: [id number] Treatment Protocol: 2.0 ATA with 90 minutes oxygen, and no air breaks Treatment Details Compression Rate Down: 1.5 psi / minute De-Compression Rate Up: 1.5 psi / minute Air breaks and breathing Compress Tx Pressure periods Decompress Decompress Begins Reached (leave unused spaces Begins Ends blank) Chamber Pressure (ATA) 1 2 - - - - - - 2 1 Clock Time (24 hr) 12:56 13:08 - - - - - - 14:38 14:48 Treatment Length: 112 (minutes) Treatment Segments: 4 Capillary Blood Glucose Pre Capillary Blood Glucose (mg/dl): Post Capillary Blood Glucose (mg/dl): Vital Signs Capillary Blood Glucose Reference Range: 80 - 120 mg / dl HBO Diabetic Blood Glucose Intervention Range: <131 mg/dl or >249 mg/dl Time Vitals Blood Respiratory Capillary Blood Glucose Pulse Action Type: Pulse: Temperature: Taken: Pressure: Rate: Glucose (mg/dl): Meter #: Oximetry (%) Taken: BARTOSZ, STRENGER ([id number]) Pre 12:42 132/78 72 18 98.3 198 1 none Post 14:53 126/72 84 18 98.4 136 1 none Treatment  Response Treatment Completion Status: Treatment Completed without Adverse Event Physician Notes No concerns with treatment given HBO Attestation I certify that I supervised this HBO treatment in accordance with Medicare guidelines. A trained Yes emergency response team is readily available per hospital policies and procedures. Continue HBOT as ordered. Yes Electronic Signature(s) Signed: 03/31/2015 5:20:54 PM By: Linton Ham MD Entered By: Linton Ham on 03/31/2015 15:51:41 [name] ([id number]) -------------------------------------------------------------------------------- HBO Safety Checklist Details [name] Date of Service: 03/31/2015 1:30 PM Patient Name: J. Patient Account Number: [id number] Medical Record Treating RN: [id number] Number: Other Clinician: [name] Date of Birth/Sex: [birth year] (50 y.o. Male) Treating [name] Primary Care Physician/Extender: Floyde Parkins Physician: Referring Physician: Sharilyn Sites in Treatment: 11 HBO Safety Checklist Items Safety Checklist Consent Form Signed Patient voided / foley secured and emptied When did you last eato 11:00 am Last dose of injectable or oral agent 07:00 am NA Ostomy pouch emptied and vented if applicable NA All implantable devices assessed, documented and approved Intravenous access site secured and place PICC LINE Valuables secured Linens and cotton and cotton/polyester blend (less than 51% polyester) Personal oil-based products / skin lotions / body lotions removed Wigs or hairpieces removed Smoking or tobacco materials removed Books / newspapers / magazines / loose paper removed Cologne, aftershave, perfume and deodorant removed Jewelry removed (may wrap wedding band) Make-up removed Hair care products removed Battery operated devices (external) removed Heating patches and chemical warmers removed NA Titanium eyewear removed NA Nail polish  cured greater than 10 hours NA Casting material cured greater than 10 hours NA Hearing aids removed NA Loose dentures or partials removed NA Prosthetics have been removed Patient demonstrates correct use of air break device (if applicable) Patient concerns have been addressed Patient grounding bracelet on and cord attached to chamber Specifics  for Inpatients (complete in addition to above) Medication sheet sent with patient RIZWAN, MAMMONE ([id number]) Intravenous medications needed or due during therapy sent with patient Drainage tubes (e.g. nasogastric tube or chest tube secured and vented) Endotracheal or Tracheotomy tube secured Cuff deflated of air and inflated with saline Airway suctioned Electronic Signature(s) Signed: 03/31/2015 4:35:20 PM By: Lorine Bears RCP, RRT, CHT Entered By: Lorine Bears on 03/31/2015 12:58:52

## 2015-04-01 NOTE — Progress Notes (Signed)
[name], [name] ([id number]) Visit Report for 03/31/2015 Arrival Information Details [name] Date of Service: 03/31/2015 1:30 PM Patient Name: J. Patient Account Number: [id number] Medical Record Treating RN: [id number] Number: Other Clinician: Jacqulyn [name] Date of Birth/Sex: October 15, 1965 (50 y.o. Male) Treating [name] Primary Care Physician/Extender: [name] Physician: Referring Physician: Sharilyn [name] in Treatment: 11 Visit Information History Since Last Visit Added or deleted any medications: No Patient Arrived: Ambulatory Any new allergies or adverse reactions: No Arrival Time: 12:40 Had a fall or experienced change in No Accompanied By: self activities of daily living that may affect Transfer Assistance: None risk of falls: Patient Identification Verified: Yes Signs or symptoms of abuse/neglect No Secondary Verification Process Yes since last visito Completed: Hospitalized since last visit: No Patient Requires Transmission-Based No Has Dressing in Place as Prescribed: Yes Precautions: Has Footwear/Offloading in Place as Yes Patient Has Alerts: Yes Prescribed: Left: Surgical Shoe with Pressure Relief Insole Right: Surgical Shoe with Pressure Relief Insole Pain Present Now: No Electronic Signature(s) Signed: 03/31/2015 4:35:20 PM By: [name] RCP, RRT, CHT Entered By: [name], Amado Nash on 03/31/2015 12:57:12 [name] -------------------------------------------------------------------------------- Encounter Discharge Information Details [name] Date of Service: 03/31/2015 1:30 PM Patient Name: J. Patient Account Number: [id number] Medical Record Treating RN: [id number] Number: Other Clinician: Jacqulyn [name] Date of Birth/Sex: April 14, 1965 (50 y.o. Male) Treating [name] Primary Care Physician/Extender: [name] Physician: Referring  Physician: Sharilyn [name] in Treatment: 11 Encounter Discharge Information Items Discharge Pain Level: 0 Discharge Condition: Stable Ambulatory Status: Ambulatory Discharge Destination: Home Private Transportation: Auto Accompanied By: self Schedule Follow-up Appointment: No Medication Reconciliation completed and No provided to Patient/Care Raybon Conard: Clinical Summary of Care: Notes Patient as an HBO treatment scheduled on 04/01/15 at 13:00 pm. Electronic Signature(s) Signed: 03/31/2015 4:35:20 PM By: [name] RCP, RRT, CHT Entered By: [name], Amado Nash on 03/31/2015 15:07:14 [name] -------------------------------------------------------------------------------- Vitals Details [name] Date of Service: 03/31/2015 1:30 PM Patient Name: J. Patient Account Number: [id number] Medical Record Treating RN: [id number] Number: Other Clinician: Jacqulyn [name] Date of Birth/Sex: 02-21-1965 (50 y.o. Male) Treating [name] Primary Care Physician/Extender: [name] Physician: Referring Physician: Sharilyn [name] in Treatment: 11 Vital Signs Time Taken: 12:42 Temperature (F): 98.3 Height (in): 74 Pulse (bpm): 72 Weight (lbs): 288 Respiratory Rate (breaths/min): 18 Body Mass Index (BMI): 37 Blood Pressure (mmHg): 132/78 Capillary Blood Glucose (mg/dl): 198 Reference Range: 80 - 120 mg / dl Electronic Signature(s) Signed: 03/31/2015 4:35:20 PM By: [name] RCP, RRT, CHT Entered By: [name] on 03/31/2015 12:58:00

## 2015-04-02 ENCOUNTER — Encounter: Payer: Managed Care, Other (non HMO) | Admitting: Surgery

## 2015-04-02 DIAGNOSIS — E11621 Type 2 diabetes mellitus with foot ulcer: Secondary | ICD-10-CM | POA: Diagnosis not present

## 2015-04-02 LAB — GLUCOSE, CAPILLARY
Glucose-Capillary: 152 mg/dL — ABNORMAL HIGH (ref 65–99)
Glucose-Capillary: 129 mg/dL — ABNORMAL HIGH (ref 65–99)

## 2015-04-02 NOTE — Progress Notes (Signed)
[name], [name] ([id number]) Visit Report for 04/01/2015 Arrival Information Details [name], [name] 04/01/2015 1:30 Patient Name: Date of Service: [name] Medical Record Patient Account Number: [id number] Number: Treating RN: Date of Birth/Sex: April 22, 1965 (50 y.o. Male) Other Clinician: [name] Primary Care Physician: [name] Treating Britto, Errol Referring Physician: [name] Physician/Extender: Suella Grove in Treatment: 12 Visit Information History Since Last Visit Added or deleted any medications: No Patient Arrived: Ambulatory Any new allergies or adverse reactions: No Arrival Time: 12:48 Had a fall or experienced change in No Accompanied By: self activities of daily living that may affect Transfer Assistance: None risk of falls: Patient Identification Verified: Yes Signs or symptoms of abuse/neglect No Secondary Verification Process Yes since last visito Completed: Hospitalized since last visit: No Patient Requires Transmission-Based No Has Dressing in Place as Prescribed: Yes Precautions: Has Footwear/Offloading in Place as Yes Patient Has Alerts: Yes Prescribed: Left: Surgical Shoe with Pressure Relief Insole Pain Present Now: No Electronic Signature(s) Signed: 04/01/2015 4:12:22 [name] By: Lorine Bears RCP, RRT, CHT Entered By: Lorine Bears on 04/01/2015 13:01:44 [name] ([id number]) -------------------------------------------------------------------------------- Encounter Discharge Information Details [name] 04/01/2015 1:30 Patient Name: Date of Service: [name] Medical Record Patient Account Number: [id number] Number: Treating RN: Date of Birth/Sex: 08-Nov-1965 (50 y.o. Male) Other Clinician: [name] Primary Care Physician: [name] Treating Christin Fudge Referring Physician: [name] Physician/Extender: Suella Grove in Treatment:  12 Encounter Discharge Information Items Discharge Pain Level: 0 Discharge Condition: Stable Ambulatory Status: Ambulatory Discharge Destination: Home Private Transportation: Auto Accompanied By: self Schedule Follow-up Appointment: No Medication Reconciliation completed and No provided to Patient/Care [name]: Clinical Summary of Care: Notes Patient has an HBO treatment scheduled on 04/02/15 at 10:00 am. Electronic Signature(s) Signed: 04/01/2015 4:12:22 [name] By: Lorine Bears RCP, RRT, CHT Entered By: Lorine Bears on 04/01/2015 15:15:37 [name] ([id number]) -------------------------------------------------------------------------------- Vitals Details [name] 04/01/2015 1:30 Patient Name: Date of Service: [name] Medical Record Patient Account Number: [id number] Number: Treating RN: Date of Birth/Sex: 1965-01-03 (50 y.o. Male) Other Clinician: [name] Primary Care Physician: [name] Treating Britto, Errol Referring Physician: [name] Physician/Extender: Weeks in Treatment: 12 Vital Signs Time Taken: 12:50 Temperature (F): 98.2 Height (in): 74 Pulse (bpm): 96 Weight (lbs): 288 Respiratory Rate (breaths/min): 18 Body Mass Index (BMI): 37 Blood Pressure (mmHg): 132/70 Capillary Blood Glucose (mg/dl): 225 Reference Range: 80 - 120 mg / dl Electronic Signature(s) Signed: 04/01/2015 4:12:22 [name] By: Lorine Bears RCP, RRT, CHT Entered By: Lorine Bears on 04/01/2015 13:02:39

## 2015-04-02 NOTE — Progress Notes (Signed)
[name], [name] ([id number]) Visit Report for 04/01/2015 HBO Details [name], [name] 04/01/2015 1:30 Patient Name: Date of Service: [name] Medical Record Patient Account Number: [id number] Number: Treating RN: Date of Birth/Sex: [birth year] (50 y.o. Male) Other Clinician: [name] Primary Care Physician: [name] Treating Britto, Errol Referring Physician: [name] Physician/Extender: Suella Grove in Treatment: 12 HBO Treatment Course Details Treatment Course Ordering Physician: Christin Fudge 1 Number: HBO Treatment Start Date: 02/01/2015 Total Treatments 60 Ordered: HBO Indication: Diabetic Ulcer(s) of the Lower Extremity Standard/Conservative Wound Care tried and failed greater than or equal to 30 days Wound #2 Right, Lateral Metatarsal head fifth HBO Treatment Details Treatment Number: [id number] Patient Type: Outpatient Chamber Type: Monoplace Chamber Serial #: [id number] Treatment Protocol: 2.0 ATA with 90 minutes oxygen, and no air breaks Treatment Details Compression Rate Down: 1.5 psi / minute De-Compression Rate Up: 1.5 psi / minute Air breaks and breathing Compress Tx Pressure periods Decompress Decompress Begins Reached (leave unused spaces Begins Ends blank) Chamber Pressure (ATA) 1 2 - - - - - - 2 1 Clock Time (24 hr) 12:59 13:11 - - - - - - 14:41 14:51 Treatment Length: 112 (minutes) Treatment Segments: 4 Capillary Blood Glucose Pre Capillary Blood Glucose (mg/dl): Post Capillary Blood Glucose (mg/dl): Vital Signs Capillary Blood Glucose Reference Range: 80 - 120 mg / dl HBO Diabetic Blood Glucose Intervention Range: <131 mg/dl or >249 mg/dl Time Vitals Blood Respiratory Capillary Blood Glucose Pulse Action Type: Pulse: Temperature: Taken: Pressure: Rate: Glucose (mg/dl): Meter #: Oximetry (%) Taken: Pre 12:50 132/70 96 18 98.2 225 1 none [name], [name] ([id number]) Post 14:55 124/64 84 18 98.4 154 1 none Treatment  Response Treatment Completion Status: Treatment Completed without Adverse Event HBO Attestation I certify that I supervised this HBO treatment in accordance with Medicare guidelines. A trained Yes emergency response team is readily available per hospital policies and procedures. Continue HBOT as ordered. Yes Electronic Signature(s) Signed: 04/01/2015 3:19:43 PM By: Christin Fudge MD, FACS Entered By: Christin Fudge on 04/01/2015 15:19:43 [name], [name] ([id number]) -------------------------------------------------------------------------------- HBO Safety Checklist Details [name], [name] 04/01/2015 1:30 Patient Name: Date of Service: [name] Medical Record Patient Account Number: [id number] Number: Treating RN: Date of Birth/Sex: Apr 08, 1965 (50 y.o. Male) Other Clinician: [name] Primary Care Physician: [name] Treating Britto, Errol Referring Physician: [name] Physician/Extender: Suella Grove in Treatment: 12 HBO Safety Checklist Items Safety Checklist Consent Form Signed Patient voided / foley secured and emptied When did you last eato 10:00 am Last dose of injectable or oral agent 07:00 am NA Ostomy pouch emptied and vented if applicable NA All implantable devices assessed, documented and approved Intravenous access site secured and place Valuables secured Linens and cotton and cotton/polyester blend (less than 51% polyester) Personal oil-based products / skin lotions / body lotions removed Wigs or hairpieces removed Smoking or tobacco materials removed Books / newspapers / magazines / loose paper removed Cologne, aftershave, perfume and deodorant removed Jewelry removed (may wrap wedding band) Make-up removed Hair care products removed Battery operated devices (external) removed Heating patches and chemical warmers removed NA Titanium eyewear removed NA Nail polish cured greater than 10 hours NA Casting material cured greater  than 10 hours NA Hearing aids removed NA Loose dentures or partials removed NA Prosthetics have been removed Patient demonstrates correct use of air break device (if applicable) Patient concerns have been addressed Patient grounding bracelet on and cord attached to chamber Specifics for Inpatients (complete in addition to above) Medication sheet sent  with patient [name], [name] ([id number]) Intravenous medications needed or due during therapy sent with patient Drainage tubes (e.g. nasogastric tube or chest tube secured and vented) Endotracheal or Tracheotomy tube secured Cuff deflated of air and inflated with saline Airway suctioned Electronic Signature(s) Signed: 04/01/2015 4:12:22 PM By: Lorine Bears RCP, RRT, CHT Entered By: Lorine Bears on 04/01/2015 13:04:14

## 2015-04-03 NOTE — Progress Notes (Addendum)
[name], [name] ([id number]) Visit Report for 04/02/2015 Chief Complaint Document Details [name], [name] 04/02/2015 12:45 Patient Name: Date of Service: [name] Medical Record Patient Account Number: [id number] Number: Treating RN: Ahmed Prima Date of Birth/Sex: December 05, 1965 (50 y.o. Male) Other Clinician: Primary Care Physician: [name] Treating Christin Fudge Referring Physician: [name] Physician/Extender: Weeks in Treatment: 12 Information Obtained from: Patient Chief Complaint Patients presents for treatment of an open diabetic ulcer on the plantar aspect of the right foot which she's had for about 4 weeks Electronic Signature(s) Signed: 04/02/2015 1:24:10 PM By: Christin Fudge MD, FACS Entered By: Christin Fudge on 04/02/2015 13:24:10 [name] ([id number]) -------------------------------------------------------------------------------- HPI Details [name], [name] 04/02/2015 12:45 Patient Name: Date of Service: [name] Medical Record Patient Account Number: [id number] Number: Treating RN: Ahmed Prima Date of Birth/Sex: 03-Aug-1965 (50 y.o. Male) Other Clinician: Primary Care Physician: [name] Treating Christin Fudge Referring Physician: [name] Physician/Extender: Weeks in Treatment: 12 History of Present Illness Location: plantar aspect of the right forefoot Quality: Patient reports No Pain. Severity: Patient states wound are getting worse. Duration: Patient has had the wound for < 4 weeks prior to presenting for treatment Timing: he has minimal discharge from the wound Context: The wound appeared gradually over time Modifying Factors: Other treatment(s) tried include:plan local care but not offloading Associated Signs and Symptoms: Patient reports having:no pain or discharge from the wound. HPI Description: This 50 year old male comes with an ulcerated area on the plantar aspect of the  right foot which she's had for approximately a month. I have known him from a previous visit at [facility] wound center and was treated in the months of April and May 2016 and rapidly healed a left plantar ulcer with a total contact cast. He has been a diabetic for about 16 years and tries to keep active and is fairly compliant with his diabetes management. 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 Past medical history significant for hypertension, hyperlipidemia, and status post appendectomy 1993. He does not smoke or drink alcohol. 01/14/2015 -- the patient had tolerated his total contact cast very well and had no problems and has had no systemic symptoms. However when his total contact cast was cut open he had excessive amount of purulent drainage in spite of being on antibiotics. He had had a recent x-ray done in the ER 12/22/2014 which showed IMPRESSION:No evidence of osseous erosion. Known soft tissue ulceration is not well characterized on radiograph. Scattered vascular calcifications seen. his last hemoglobin A1c in December was 7.3. He has been on Augmentin and doxycycline for the last 2 weeks. 01/21/2015 -- his culture grew rare growth of Pantoea species an MR moderate growth of Candida parapsilosis. it is sensitive to levofloxacin. He has not heard back from the insurance company regarding his hyperbaric oxygen therapy. His MRI has not been done yet and we will try and get him an earlier date 01/28/2015 -- MRI was done last night -- IMPRESSION:1. Soft tissue ulcer overlying the plantar aspect of the fifth metatarsal head extending to the cortex. Subcortical marrow edema in the fifth metatarsal head with corresponding T1 hypointensity is concerning for early osteomyelitis of the plantar lateral aspect of the fifth metatarsal head. Chest x-ray done on 01/14/2015 shows bronchiectatic changes without infiltrate. EKG done on generally 17 2017 shows a normal sinus rhythm and is a  normal EKG. [name], [name] ([id number]) 02/04/2015 -- he was asked to see Dr. [name] last week and had 2 appointments but had  to cancel both due to pressures of work. Last night he has woken up with severe pain in the foot and leg and it is swollen up. No fever or no change in his blood glucose. Addendum: I spoke with Dr. [name] who kindly agreed to accept the patient for inpatient therapy and have also opened to the hospitalist Dr. [name], and discuss details of the management including PICC line and repeat cultures. 02/12/2015-- -- was seen by Dr. [name] in the hospital and a PICC line was placed. He was to receive Ceftazidime 2 g every 12 hourly, oral levofloxacin 750 mg every 24 hourly and oral fluconazole 200 mg daily. The antibiotics were to be given for 4 weeks except the Diflucan was to be given for the first 2 weeks. Reviewed note from 02/10/2015 -- and Dr. [name] had recommended management for growth of MSSA and Serratia. He switched him from ceftazidime to ceftriaxone 2 g every 24 hours. Levofloxacin was stopped and he would continue on fluconazole for another week. He had asked me to decide whether further imaging was necessary and whether surgical debridement of the infected bone was needed. He is doing well and has been off work for this week and we will keep him off the next week. 02/22/2015 -- he was seen by my colleague on 01/19/2015 and at that time an incision and drainage was done on his right lateral forefoot on the dorsum. Today when I probed this wound it is frankly draining pus and it communicates with the ulcer on the plantar aspect of his right foot. The patient is still on IV ceftriaxone 2 g every 24 hours and is to be seen by Dr. [name] on Friday. 03/19/2015 -- On 03/04/2015 I spoke to Dr. Celesta Gentile who saw him in the office today and did an x-ray of his right foot and noted that there was osteomyelitis of the right fifth toe and  metatarsal and a lot of pus draining from the wound. He recommended operative debridement which would probably result in the fifth metatarsal head and toe amputation.The patient would be referred back to [place] once he was done with surgery. He was admitted to [facility] yesterday and had surgery done by podiatry for a right fifth metatarsal acute osteomyelitis with cellulitis and abscess. He had a right foot incision and drainage with fifth metatarsal partial amputation and removal of toe infected bone and soft tissue with cultures. The wound was partially closed and packing of the distal end was done. Patient was already on cefepime 2 g IV every 8 hourly and put on vancomycin pending final cultures. He had grown moderate gram-negative rods, and later found to be rare diphtheroids. I received a call from Dr. Cannon Kettle the podiatrist and we discussed the above. On 03/10/2015 he was found positive for influenza a and has been put on Tamiflu. Since his discharge he has been seen by Dr. Cannon Kettle who is planning to remove his sutures this coming week. He was reviewed by Dr. [name] on 03/17/2015 and his Diflucan was stopped and vancomycin. After the 3 doses he is taking. He is going to change Ceftazidime to Zosyn 3.375 g IV every 8 hours. 03/25/2015 -- he was seen by the podiatrist a couple of days ago and the sutures were removed. She will follow back with him in 4 weeks' time and at that time x-rays will be taken and a custom molded insert would be made for his shoe. 04/01/2015 -- he was seen by Dr.  Fitzgerald on 03/29/2015 who pulled the PICC line stopped his IV antibiotics and recommended starting doxycycline and levofloxacin for 2 weeks. He also stop the fluconazole. The patient will follow up with him only when necessary. Electronic Signature(s) Signed: 04/02/2015 1:24:34 PM By: Christin Fudge MD, FACS Previous Signature: 04/02/2015 12:54:37 PM Version By: Christin Fudge MD, FACS Previous  Signature: 04/02/2015 12:52:29 PM Version By: Christin Fudge MD, FACS [name], [name] ([id number]) Entered By: Christin Fudge on 04/02/2015 13:24:34 [name], [name] ([id number]) -------------------------------------------------------------------------------- Physical Exam Details [name], [name] 04/02/2015 12:45 Patient Name: Date of Service: [name] Medical Record Patient Account Number: [id number] Number: Treating RN: Ahmed Prima Date of Birth/Sex: [birth year] (50 y.o. Male) Other Clinician: Primary Care Physician: [name] Treating Christin Fudge Referring Physician: [name] Physician/Extender: Weeks in Treatment: 12 Constitutional . Pulse regular. Respirations normal and unlabored. Afebrile. . Eyes Nonicteric. Reactive to light. Ears, Nose, Mouth, and Throat Lips, teeth, and gums WNL.Marland Kitchen Moist mucosa without lesions. Neck supple and nontender. No palpable supraclavicular or cervical adenopathy. Normal sized without goiter. Respiratory WNL. No retractions.. Breath sounds WNL, No rubs, rales, rhonchi, or wheeze.. Cardiovascular Heart rhythm and rate regular, no murmur or gallop.. Pedal Pulses WNL. No clubbing, cyanosis or edema. Chest Breasts symmetical and no nipple discharge.. Breast tissue WNL, no masses, lumps, or tenderness.. Lymphatic No adneopathy. No adenopathy. No adenopathy. Musculoskeletal Adexa without tenderness or enlargement.. Digits and nails w/o clubbing, cyanosis, infection, petechiae, ischemia, or inflammatory conditions.. Integumentary (Hair, Skin) No suspicious lesions. No crepitus or fluctuance. No peri-wound warmth or erythema. No masses.Marland Kitchen Psychiatric Judgement and insight Intact.. No evidence of depression, anxiety, or agitation.. Notes the wound is looking clean but discovered with some extraneous debris possibly exudate and dressing material and I removed this with a forcep and noted there was some bleeding  briskly from the area and I used silver nitrate to control this. Electronic Signature(s) Signed: 04/02/2015 1:25:12 PM By: Christin Fudge MD, FACS Entered By: Christin Fudge on 04/02/2015 13:25:11 [name], [name] ([id number]) -------------------------------------------------------------------------------- Physician Orders Details [name], [name] 04/02/2015 12:45 Patient Name: Date of Service: [name] Medical Record Patient Account Number: [id number] Number: Treating RN: Ahmed Prima Date of Birth/Sex: 1965/03/16 (50 y.o. Male) Other Clinician: Primary Care Physician: [name] Treating Christin Fudge Referring Physician: [name] Physician/Extender: Suella [place] in Treatment: 12 Verbal / Phone Orders: Yes ClinicianCarolyne Fiscal, Debi Read Back and Verified: Yes Diagnosis Coding Wound Cleansing Wound #2 Right,Lateral Metatarsal head fifth o Clean wound with Normal Saline. Anesthetic Wound #2 Right,Lateral Metatarsal head fifth o Topical Lidocaine 4% cream applied to wound bed prior to debridement Skin Barriers/Peri-Wound Care Wound #2 Right,Lateral Metatarsal head fifth o Skin Prep Primary Wound Dressing Wound #2 Right,Lateral Metatarsal head fifth o Hydrafera Blue Secondary Dressing Wound #2 Right,Lateral Metatarsal head fifth o Boardered Foam Dressing Dressing Change Frequency Wound #2 Right,Lateral Metatarsal head fifth o Change dressing every other day. Follow-up Appointments Wound #2 Right,Lateral Metatarsal head fifth o Return Appointment in 1 week. Off-Loading Wound #2 Right,Lateral Metatarsal head fifth o Open toe surgical shoe with peg assist. - front offload LAI, VENUS ([id number]) Medications-please add to medication list. Wound #2 Right,Lateral Metatarsal head fifth o Other: - continue oral antibiotics Electronic Signature(s) Signed: 04/02/2015 4:02:38 PM By: Christin Fudge MD, FACS Signed: 04/06/2015  4:28:40 PM By: Alric Quan Entered By: Alric Quan on 04/02/2015 13:09:12 [name] ([id number]) -------------------------------------------------------------------------------- Problem List Details [name], [name] 04/02/2015 12:45 Patient Name: Date of Service: [name] Medical Record Patient Account  Number: [id number] Number: Treating RN: Ahmed Prima Date of Birth/Sex: July 28, 1965 (50 y.o. 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 Medical Record Patient Account Number: [id number] Number: Treating RN: Ahmed Prima Date of Birth/Sex: [birth year] (50 y.o. Male) Other Clinician: Primary Care Physician: [name] Treating Ambrosio Reuter Referring Physician: [name] Physician/Extender: Weeks in Treatment: 12 Subjective Chief Complaint Information obtained from Patient Patients presents for treatment of an open diabetic ulcer on the plantar aspect of the right foot which she's had for about 4 weeks History of Present  Illness (HPI) The following HPI elements were documented for the patient's wound: Location: plantar aspect of the right forefoot Quality: Patient reports No Pain. Severity: Patient states wound are getting worse. Duration: Patient has had the wound for < 4 weeks prior to presenting for treatment Timing: he has minimal discharge from the wound Context: The wound appeared gradually over time Modifying Factors: Other treatment(s) tried include:plan local care but not offloading Associated Signs and Symptoms: Patient reports having:no pain or discharge from the wound. This 50 year old male comes with an ulcerated area on the plantar aspect of the right foot which she's had for approximately a month. I have known him from a previous visit at [facility] wound center and was treated in the months of April and May 2016 and rapidly healed a left plantar ulcer with a total contact cast. He has been a diabetic for about 16 years and tries to keep active and is fairly compliant with his diabetes management. 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 Past medical history significant for hypertension, hyperlipidemia, and status post appendectomy 1993. He does not smoke or drink alcohol. 01/14/2015 -- the patient had tolerated his total contact cast very well and had no problems and has had no systemic symptoms. However when his total contact cast was cut open he had excessive amount of purulent drainage in spite of being on antibiotics. He had had a recent x-ray done in the ER 12/22/2014 which showed IMPRESSION:No evidence of osseous erosion. Known soft tissue ulceration is not well characterized on radiograph. Scattered vascular calcifications seen. his last hemoglobin A1c in December was 7.3. He has been on Augmentin and doxycycline for the last 2 weeks. 01/21/2015 -- his culture grew rare growth of Pantoea species an MR moderate growth of Candida parapsilosis. it is sensitive to levofloxacin. He has  not heard back from the insurance company regarding his hyperbaric oxygen therapy. [name], [name] ([id number]) His MRI has not been done yet and we will try and get him an earlier date 01/28/2015 -- MRI was done last night -- IMPRESSION:1. Soft tissue ulcer overlying the plantar aspect of the fifth metatarsal head extending to the cortex. Subcortical marrow edema in the fifth metatarsal head with corresponding T1 hypointensity is concerning for early osteomyelitis of the plantar lateral aspect of  the fifth metatarsal head. Chest x-ray done on 01/14/2015 shows bronchiectatic changes without infiltrate. EKG done on generally 17 2017 shows a normal sinus rhythm and is a normal EKG. 02/04/2015 -- he was asked to see Dr. [name] last week and had 2 appointments but had to cancel both due to pressures of work. Last night he has woken up with severe pain in the foot and leg and it is swollen up. No fever or no change in his blood glucose. Addendum: I spoke with Dr. [name] who kindly agreed to accept the patient for inpatient therapy and have also opened to the hospitalist Dr. [name], and discuss details of the management including PICC line and repeat cultures. 02/12/2015-- -- was seen by Dr. [name] in the hospital and a PICC line was placed. He was to receive Ceftazidime 2 g every 12 hourly, oral levofloxacin 750 mg every 24 hourly and oral fluconazole 200 mg daily. The antibiotics were to be given for 4 weeks except the Diflucan was to be given for the first 2 weeks. Reviewed note from 02/10/2015 -- and Dr. [name] had recommended management for growth of MSSA and Serratia. He switched him from ceftazidime to ceftriaxone 2 g every 24 hours. Levofloxacin was stopped and he would continue on fluconazole for another week. He had asked me to decide whether further imaging was necessary and whether surgical debridement of the infected bone was needed. He is doing well and has been off  work for this week and we will keep him off the next week. 02/22/2015 -- he was seen by my colleague on 01/19/2015 and at that time an incision and drainage was done on his right lateral forefoot on the dorsum. Today when I probed this wound it is frankly draining pus and it communicates with the ulcer on the plantar aspect of his right foot. The patient is still on IV ceftriaxone 2 g every 24 hours and is to be seen by Dr. [name] on Friday. 03/19/2015 -- On 03/04/2015 I spoke to Dr. 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 He recommended operative debridement which would probably result in the fifth metatarsal head and toe amputation.The patient would be referred back to [place] once he was done with surgery. He was admitted to [facility] yesterday and had surgery done by podiatry for a right fifth metatarsal acute osteomyelitis with cellulitis and abscess. He had a right foot incision and drainage with fifth metatarsal partial amputation and removal of toe infected bone and soft tissue with cultures. The wound was partially closed and packing of the distal end was done. Patient was already on cefepime 2 g IV every 8 hourly and put on vancomycin pending final cultures. He had grown moderate gram-negative rods, and later found to be rare diphtheroids. I received a call from Dr. Cannon Kettle the podiatrist and we discussed the above. On 03/10/2015 he was found positive for influenza a and has been put on Tamiflu. Since his discharge he has been seen by Dr. Cannon Kettle who is planning to remove his sutures this coming week. He was reviewed by Dr. [name] on 03/17/2015 and his Diflucan was stopped and vancomycin. After the 3 doses he is taking. He is going to change Ceftazidime to Zosyn 3.375 g IV every 8 hours. 03/25/2015 -- he was seen  by  the podiatrist a couple of days ago and the sutures were removed. She will follow back with him in 4 weeks' time and at that time x-rays will be taken and a custom molded insert would be made for his shoe. [name], ZAMANI ([id number]) 04/01/2015 -- he was seen by Dr. [name] on 03/29/2015 who pulled the PICC line stopped his IV antibiotics and recommended starting doxycycline and levofloxacin for 2 weeks. He also stop the fluconazole. The patient will follow up with him only when necessary. Objective Constitutional Pulse regular. Respirations normal and unlabored. Afebrile. Vitals Time Taken: 12:15 PM, Height: 74 in, Weight: 288 lbs, BMI: 37, Temperature: 98.0 F, Pulse: 84 bpm, Respiratory Rate: 18 breaths/min, Blood Pressure: 132/72 mmHg, Capillary Blood Glucose: 129 mg/dl. Eyes Nonicteric. Reactive to light. Ears, Nose, Mouth, and Throat Lips, teeth, and gums WNL.Marland Kitchen Moist mucosa without lesions. Neck supple and nontender. No palpable supraclavicular or cervical adenopathy. Normal sized without goiter. Respiratory WNL. No retractions.. Breath sounds WNL, No rubs, rales, rhonchi, or wheeze.. Cardiovascular Heart rhythm and rate regular, no murmur or gallop.. Pedal Pulses WNL. No clubbing, cyanosis or edema. Chest Breasts symmetical and no nipple discharge.. Breast tissue WNL, no masses, lumps, or tenderness.. Lymphatic No adneopathy. No adenopathy. No adenopathy. Musculoskeletal Adexa without tenderness or enlargement.. Digits and nails w/o clubbing, cyanosis, infection, petechiae, ischemia, or inflammatory conditions.Marland Kitchen Psychiatric Judgement and insight Intact.. No evidence of depression, anxiety, or agitation.. General Notes: the wound is looking clean but discovered with some extraneous debris possibly exudate and dressing material and I removed this with a forcep and noted there was some bleeding briskly from the Bowman, ASHTON SELLITTI. ([id number]) area and I used  silver nitrate to control this. Integumentary (Hair, Skin) No suspicious lesions. No crepitus or fluctuance. No peri-wound warmth or erythema. No masses.. Wound #2 status is Open. Original cause of wound was Bump. The wound is located on the Right,Lateral Metatarsal head fifth. The wound measures 2cm length x 0.5cm width x 0.1cm depth; 0.785cm^2 area and 0.079cm^3 volume. The wound is limited to skin breakdown. There is no tunneling or undermining noted. There is a large amount of serosanguineous drainage noted. The wound margin is flat and intact. There is medium (34-66%) red granulation within the wound bed. There is a medium (34-66%) amount of necrotic tissue within the wound bed including Adherent Slough. The periwound skin appearance exhibited: Moist, Erythema. The periwound skin appearance did not exhibit: Callus, Crepitus, Excoriation, Fluctuance, Friable, Induration, Localized Edema, Rash, Scarring, Dry/Scaly, Maceration, Atrophie Blanche, Cyanosis, Ecchymosis, Hemosiderin Staining, Mottled, Pallor, Rubor. The surrounding wound skin color is noted with erythema which is circumferential. Periwound temperature was noted as Hot. The periwound has tenderness on palpation. 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 Anesthetic: Wound #2 Right,Lateral Metatarsal head fifth: Topical Lidocaine 4% cream applied to wound bed prior to debridement Skin Barriers/Peri-Wound Care: Wound #2 Right,Lateral Metatarsal head fifth: Skin Prep Primary Wound Dressing: Wound #2 Right,Lateral Metatarsal head fifth: [id number] SHEPHEN, LASHLEE ([id number]) Secondary Dressing: Wound #2 Right,Lateral Metatarsal head fifth: Boardered Foam Dressing Dressing Change Frequency: Wound #2 Right,Lateral Metatarsal head fifth: Change dressing every other day. Follow-up Appointments: Wound #2 Right,Lateral  Metatarsal head fifth: Return Appointment in 1 week. Off-Loading: Wound #2 Right,Lateral Metatarsal head fifth: Open toe surgical shoe with peg assist. - front offload Medications-please add to medication list.: Wound #2 Right,Lateral Metatarsal head fifth: Other: - continue oral antibiotics I have recommended Hydrofera Blue over this area with a bordered foam to be changed every other day. He will continue with his antibiotics as per Dr. [name] and will benefit from hyperbaric oxygen therapy for his Earleen [place] stage III diabetic foot ulcer. I have no objection if he wants to go back to work at this stage and he will check with his podiatrist Dr. Cannon Kettle. Electronic Signature(s) Signed: 04/02/2015 1:26:05 PM By: Christin Fudge MD, FACS Entered By: Christin Fudge on 04/02/2015 13:26:05 [name] -------------------------------------------------------------------------------- SuperBill Details Patient Name: [name] Date of Service: 04/02/2015 Medical Record Number: [id number] Patient Account Number: [id number] Date of Birth/Sex: [birth year] (50 y.o. 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, right ankle and foot Facility Procedures CPT4 Code:  [id number] Description: [id number] - WOUND CARE VISIT-LEV 2 EST PT Modifier: Quantity: 1 Physician Procedures CPT4 Code Description: [id number] - WC PHYS LEVEL 3 - EST PT ICD-10 Description Diagnosis E11.621 Type 2 diabetes mellitus with foot ulcer L97.512 Non-pressure chronic ulcer of other part of right fo M86.371 Chronic multifocal osteomyelitis, right  ankle and fo Modifier: ot with fat lay ot Quantity: 1 er exposed Electronic Signature(s) Signed: 04/02/2015 4:02:38 PM By: Christin Fudge MD, FACS Signed: 04/06/2015 4:28:40 PM By: Alric Quan Previous Signature: 04/02/2015 1:26:19 PM Version By: Christin Fudge MD, FACS Entered By: Alric Quan on 04/02/2015 14:15:40

## 2015-04-03 NOTE — Progress Notes (Signed)
[name], [name] ([id number]) Visit Report for 04/02/2015 HBO Details [name], [name] 04/02/2015 10:00 Patient Name: Date of Service: [name] Medical Record Patient Account Number: [id number] Number: Treating RN: Date of Birth/Sex: 03/22/1965 (50 y.o. Male) Other Clinician: [name] Primary Care Physician: [name] Treating Britto, Errol Referring Physician: [name] Physician/Extender: Suella Grove in Treatment: 12 HBO Treatment Course Details Treatment Course Ordering Physician: Christin Fudge 1 Number: HBO Treatment Start Date: 02/01/2015 Total Treatments 60 Ordered: HBO Indication: Diabetic Ulcer(s) of the Lower Extremity Standard/Conservative Wound Care tried and failed greater than or equal to 30 days Wound #2 Right, Lateral Metatarsal head fifth HBO Treatment Details Treatment Number: 34 Patient Type: Outpatient Chamber Type: Monoplace Chamber Serial #: [id number] Treatment Protocol: 2.0 ATA with 90 minutes oxygen, and no air breaks Treatment Details Compression Rate Down: 1.5 psi / minute De-Compression Rate Up: 1.5 psi / minute Air breaks and breathing Compress Tx Pressure periods Decompress Decompress Begins Reached (leave unused spaces Begins Ends blank) Chamber Pressure (ATA) 1 2 - - - - - - 2 1 Clock Time (24 hr) 10:20 10:30 - - - - - - 12:00 12:10 Treatment Length: 110 (minutes) Treatment Segments: 4 Capillary Blood Glucose Pre Capillary Blood Glucose (mg/dl): Post Capillary Blood Glucose (mg/dl): Vital Signs Capillary Blood Glucose Reference Range: 80 - 120 mg / dl HBO Diabetic Blood Glucose Intervention Range: <131 mg/dl or >249 mg/dl Time Vitals Blood Respiratory Capillary Blood Glucose Pulse Action Type: Pulse: Temperature: Taken: Pressure: Rate: Glucose (mg/dl): Meter #: Oximetry (%) Taken: Pre 10:08 118/76 90 18 98 152 1 none [name], [name] J. ([id number]) Post 12:15 132/72 84 18 98 129 1 none Treatment  Response Treatment Completion Status: Treatment Completed without Adverse Event HBO Attestation I certify that I supervised this HBO treatment in accordance with Medicare guidelines. A trained Yes emergency response team is readily available per hospital policies and procedures. Continue HBOT as ordered. Yes Electronic Signature(s) Signed: 04/02/2015 12:46:58 PM By: Christin Fudge MD, FACS Entered By: Christin Fudge on 04/02/2015 12:46:58 [name] ([id number]) -------------------------------------------------------------------------------- HBO Safety Checklist Details [name], [name] 04/02/2015 10:00 Patient Name: Date of Service: [name] Medical Record Patient Account Number: [id number] Number: Treating RN: Date of Birth/Sex: December 26, 1965 (50 y.o. Male) Other Clinician: [name] Primary Care Physician: [name] Treating Britto, Errol Referring Physician: [name] Physician/Extender: Suella Grove in Treatment: 12 HBO Safety Checklist Items Safety Checklist Consent Form Signed Patient voided / foley secured and emptied When did you last eato 07:00 [name] Last dose of injectable or oral agent 07:00 [name] NA Ostomy pouch emptied and vented if applicable NA All implantable devices assessed, documented and approved Intravenous access site secured and place Valuables secured Linens and cotton and cotton/polyester blend (less than 51% polyester) Personal oil-based products / skin lotions / body lotions removed Wigs or hairpieces removed Smoking or tobacco materials removed Books / newspapers / magazines / loose paper removed Cologne, aftershave, perfume and deodorant removed Jewelry removed (may wrap wedding band) Make-up removed Hair care products removed Battery operated devices (external) removed Heating patches and chemical warmers removed NA Titanium eyewear removed NA Nail polish cured greater than 10 hours NA Casting material cured greater  than 10 hours NA Hearing aids removed NA Loose dentures or partials removed NA Prosthetics have been removed Patient demonstrates correct use of air break device (if applicable) Patient concerns have been addressed Patient grounding bracelet on and cord attached to chamber Specifics for Inpatients (complete in addition to above) Medication sheet sent  with patient [name], [name] ([id number]) Intravenous medications needed or due during therapy sent with patient Drainage tubes (e.g. nasogastric tube or chest tube secured and vented) Endotracheal or Tracheotomy tube secured Cuff deflated of air and inflated with saline Airway suctioned Electronic Signature(s) Signed: 04/02/2015 3:15:34 PM By: Lorine Bears RCP, RRT, CHT Entered By: Lorine Bears on 04/02/2015 10:17:17

## 2015-04-03 NOTE — Progress Notes (Signed)
NERI, LOO ([id number]) Visit Report for 04/02/2015 Arrival Information Details [name], [name] 04/02/2015 10:00 Patient Name: Date of Service: [name] Medical Record Patient Account Number: [id number] Number: Treating RN: Date of Birth/Sex: 05-08-1965 (50 y.o. Male) Other Clinician: [name] Primary Care Physician: [name] Treating Christin Fudge Referring Physician: [name] Physician/Extender: Suella Grove in Treatment: 12 Visit Information History Since Last Visit Added or deleted any medications: No Patient Arrived: Ambulatory Any new allergies or adverse reactions: No Arrival Time: 10:05 Had a fall or experienced change in No Accompanied By: self activities of daily living that may affect Transfer Assistance: None risk of falls: Patient Identification Verified: Yes Hospitalized since last visit: No Secondary Verification Process Yes Has Dressing in Place as Prescribed: Yes Completed: Has Footwear/Offloading in Place as Yes Patient Requires Transmission-Based No Prescribed: Precautions: Left: Surgical Shoe with Patient Has Alerts: Yes Pressure Relief Insole Pain Present Now: No Electronic Signature(s) Signed: 04/02/2015 3:15:34 PM By: Lorine Bears RCP, RRT, CHT Entered By: Becky Sax, Amado Nash on 04/02/2015 10:15:56 [name] ([id number]) -------------------------------------------------------------------------------- Encounter Discharge Information Details [name], [name] 04/02/2015 10:00 Patient Name: Date of Service: [name] Medical Record Patient Account Number: [id number] Number: Treating RN: Date of Birth/Sex: 01-30-1965 (50 y.o. Male) Other Clinician: [name] Primary Care Physician: [name] Treating Christin Fudge Referring Physician: [name] Physician/Extender: [name] in Treatment: 12 Encounter Discharge Information Items Discharge Pain Level:  0 Discharge Condition: Stable Ambulatory Status: Ambulatory Discharge Destination: Home Private Transportation: Auto Accompanied By: self Schedule Follow-up Appointment: No Medication Reconciliation completed and No provided to Patient/Care Narvel Kozub: Clinical Summary of Care: Notes Patient has an HBO treatment scheduled on 04/05/15 at 13:00 pm. Electronic Signature(s) Signed: 04/02/2015 3:15:34 PM By: Lorine Bears RCP, RRT, CHT Entered By: Lorine Bears on 04/02/2015 12:25:22 [name] ([id number]) -------------------------------------------------------------------------------- Vitals Details [place], [place] 04/02/2015 10:00 Patient Name: Date of Service: [name] Medical Record Patient Account Number: [id number] Number: Treating RN: Date of Birth/Sex: 1965/03/03 (50 y.o. Male) Other Clinician: [name] Primary Care Physician: [name] Treating Britto, Errol Referring Physician: [name] Physician/Extender: [name] in Treatment: 12 Vital Signs Time Taken: 10:08 Temperature (F): 98.0 Height (in): 74 Pulse (bpm): 90 Weight (lbs): 288 Respiratory Rate (breaths/min): 18 Body Mass Index (BMI): 37 Blood Pressure (mmHg): 118/76 Capillary Blood Glucose (mg/dl): 152 Reference Range: 80 - 120 mg / dl Electronic Signature(s) Signed: 04/02/2015 3:15:34 PM By: Lorine Bears RCP, RRT, CHT Entered By: Becky Sax, Amado Nash on 04/02/2015 10:16:25

## 2015-04-05 ENCOUNTER — Encounter: Payer: Managed Care, Other (non HMO) | Attending: Surgery | Admitting: Surgery

## 2015-04-05 DIAGNOSIS — E785 Hyperlipidemia, unspecified: Secondary | ICD-10-CM | POA: Diagnosis not present

## 2015-04-05 DIAGNOSIS — I10 Essential (primary) hypertension: Secondary | ICD-10-CM | POA: Diagnosis not present

## 2015-04-05 DIAGNOSIS — E1140 Type 2 diabetes mellitus with diabetic neuropathy, unspecified: Secondary | ICD-10-CM | POA: Diagnosis not present

## 2015-04-05 DIAGNOSIS — L02611 Cutaneous abscess of right foot: Secondary | ICD-10-CM | POA: Diagnosis not present

## 2015-04-05 DIAGNOSIS — E11621 Type 2 diabetes mellitus with foot ulcer: Secondary | ICD-10-CM | POA: Insufficient documentation

## 2015-04-05 DIAGNOSIS — M86371 Chronic multifocal osteomyelitis, right ankle and foot: Secondary | ICD-10-CM | POA: Diagnosis not present

## 2015-04-05 DIAGNOSIS — Z794 Long term (current) use of insulin: Secondary | ICD-10-CM | POA: Insufficient documentation

## 2015-04-05 DIAGNOSIS — L84 Corns and callosities: Secondary | ICD-10-CM | POA: Insufficient documentation

## 2015-04-05 DIAGNOSIS — L97512 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d: Secondary | ICD-10-CM | POA: Insufficient documentation

## 2015-04-05 LAB — GLUCOSE, CAPILLARY
Glucose-Capillary: 128 mg/dL — ABNORMAL HIGH (ref 65–99)
Glucose-Capillary: 144 mg/dL — ABNORMAL HIGH (ref 65–99)
Glucose-Capillary: 161 mg/dL — ABNORMAL HIGH (ref 65–99)

## 2015-04-05 NOTE — Progress Notes (Signed)
[name], [name] ([id number]) Visit Report for 04/05/2015 HBO Details Patient Name: [name], [name]. Date of Service: 04/05/2015 1:30 PM Medical Record Number: [id number] Patient Account Number: [id number] Date of Birth/Sex: [birth year] (50 y.o. Male) Treating RN: Primary Care Physician: [name] Other Clinician: Jacqulyn [name] Referring Physician: [name] Treating Physician/Extender: [name] in Treatment: 12 HBO Treatment Course Details Treatment Course Ordering Physician: [name] 1 Number: HBO Treatment Start Date: 02/01/2015 Total Treatments 60 Ordered: HBO Indication: Diabetic Ulcer(s) of the Lower Extremity Standard/Conservative Wound Care tried and failed greater than or equal to 30 days Wound #2 Right, Lateral Metatarsal head fifth HBO Treatment Details Treatment Number: 35 Patient Type: Outpatient Chamber Type: Monoplace Chamber Serial #: [id number] Treatment Protocol: 2.0 ATA with 90 minutes oxygen, and no air breaks Treatment Details Compression Rate Down: 1.5 psi / minute De-Compression Rate Up: 1.5 psi / minute Air breaks and breathing Compress Tx Pressure periods Decompress Decompress Begins Reached (leave unused spaces Begins Ends blank) Chamber Pressure (ATA) 1 2 - - - - - - 2 1 Clock Time (24 hr) 13:20 13:30 - - - - - - 15:00 15:11 Treatment Length: 111 (minutes) Treatment Segments: 4 Capillary Blood Glucose Pre Capillary Blood Glucose (mg/dl): Post Capillary Blood Glucose (mg/dl): Vital Signs Capillary Blood Glucose Reference Range: 80 - 120 mg / dl HBO Diabetic Blood Glucose Intervention Range: <131 mg/dl or >249 mg/dl Time Vitals Blood Respiratory Capillary Blood Glucose Pulse Action Type: Pulse: Temperature: Taken: Pressure: Rate: Glucose (mg/dl): Meter #: Oximetry (%) Taken: Pre 12:48 128/72 90 18 98.1 128 1 gave Ensure per protocol Pre 13:18 144 1 proceed with HBO per protocol Post 15:15 132/72 72 18 98.3  161 1 none [name], VEIKKO [name] ([id number]) Treatment Response Treatment Completion Status: Treatment Completed without Adverse Event Electronic Signature(s) Signed: 04/05/2015 3:52:50 PM By: [name] RCP, RRT, CHT Signed: 04/05/2015 4:30:18 PM By: [name] Previous Signature: 04/05/2015 3:31:57 PM Version By: [name] Entered By: [name] on 04/05/2015 15:32:52 [name] Simmers ([id number]) -------------------------------------------------------------------------------- HBO Safety Checklist Details Patient Name: [name], [name]. Date of Service: 04/05/2015 1:30 PM Medical Record Number: [id number] Patient Account Number: [id number] Date of Birth/Sex: 12-22-1965 (50 y.o. Male) Treating RN: Primary Care Physician: [name] Other Clinician: Jacqulyn [name] Referring Physician: [name] Treating Physician/Extender: [name] in Treatment: 12 HBO Safety Checklist Items Safety Checklist Consent Form Signed Patient voided / foley secured and emptied When did you last eato 07:30 am Last dose of injectable or oral agent 07:00 am NA Ostomy pouch emptied and vented if applicable NA All implantable devices assessed, documented and approved Intravenous access site secured and place Valuables secured Linens and cotton and cotton/polyester blend (less than 51% polyester) Personal oil-based products / skin lotions / body lotions removed Wigs or hairpieces removed Smoking or tobacco materials removed Books / newspapers / magazines / loose paper removed Cologne, aftershave, perfume and deodorant removed Jewelry removed (may wrap wedding band) Make-up removed Hair care products removed Battery operated devices (external) removed Heating patches and chemical warmers removed NA Titanium eyewear removed NA Nail polish cured greater than 10 hours NA Casting material cured greater than 10 hours NA  Hearing aids removed NA Loose dentures or partials removed NA Prosthetics have been removed Patient demonstrates correct use of air break device (if applicable) Patient concerns have been addressed Patient grounding bracelet on and cord attached to chamber Specifics for Inpatients (complete in addition to above) Medication sheet  sent with patient Intravenous medications needed or due during therapy sent with patient [name], [name] ([id number]) Drainage tubes (e.g. nasogastric tube or chest tube secured and vented) Endotracheal or Tracheotomy tube secured Cuff deflated of air and inflated with saline Airway suctioned Electronic Signature(s) Signed: 04/05/2015 3:52:50 PM By: [name] RCP, RRT, CHT Entered By: [name] on 04/05/2015 13:16:02

## 2015-04-05 NOTE — Progress Notes (Signed)
OLEE, DICLEMENTE ([id number]) Visit Report for 04/05/2015 Arrival Information Details Patient Name: CHEENOU, DOWIS. Date of Service: 04/05/2015 1:30 PM Medical Record Number: [id number] Patient Account Number: [id number] Date of Birth/Sex: [birth year] (50 y.o. Male) Treating RN: Primary Care Physician: [name] Other Clinician: [name] Referring Physician: [name] Treating Physician/Extender: Frann Rider in Treatment: 12 Visit Information History Since Last Visit Added or deleted any medications: No Patient Arrived: Ambulatory Any new allergies or adverse reactions: No Arrival Time: 12:45 Had a fall or experienced change in No Accompanied By: self activities of daily living that may affect Transfer Assistance: None risk of falls: Patient Identification Verified: Yes Signs or symptoms of abuse/neglect No Secondary Verification Process Yes since last visito Completed: Hospitalized since last visit: No Patient Requires Transmission-Based No Has Dressing in Place as Prescribed: Yes Precautions: Has Footwear/Offloading in Place as Yes Patient Has Alerts: Yes Prescribed: Left: Surgical Shoe with Pressure Relief Insole Pain Present Now: No Electronic Signature(s) Signed: 04/05/2015 3:52:50 PM By: Lorine Bears RCP, RRT, CHT Entered By: Lorine Bears on 04/05/2015 13:14:51 [name] -------------------------------------------------------------------------------- Encounter Discharge Information Details Patient Name: [name] Date of Service: 04/05/2015 1:30 PM Medical Record Number: [id number] Patient Account Number: [id number] Date of Birth/Sex: Nov 09, 1965 (50 y.o. Male) Treating RN: Primary Care Physician: [name] Other Clinician: [name] Referring Physician: [name] Treating Physician/Extender: Frann Rider in Treatment: 12 Encounter  Discharge Information Items Discharge Pain Level: 0 Discharge Condition: Stable Ambulatory Status: Ambulatory Discharge Destination: Home Private Transportation: Auto Accompanied By: self Schedule Follow-up Appointment: No Medication Reconciliation completed and No provided to Patient/Care Randol Zumstein: Clinical Summary of Care: Notes Patient has an HBO treatment scheduled on 04/06/15 at 13:00 pm. Electronic Signature(s) Signed: 04/05/2015 3:52:50 PM By: Lorine Bears RCP, RRT, CHT Entered By: Lorine Bears on 04/05/2015 15:33:41 [name] -------------------------------------------------------------------------------- Vitals Details Patient Name: [name] Date of Service: 04/05/2015 1:30 PM Medical Record Number: [id number] Patient Account Number: [id number] Date of Birth/Sex: 11-07-1965 (50 y.o. Male) Treating RN: Primary Care Physician: [name] Other Clinician: [name] Referring Physician: [name] Treating Physician/Extender: Frann Rider in Treatment: 12 Vital Signs Time Taken: 12:48 Temperature (F): 98.1 Height (in): 74 Pulse (bpm): 90 Weight (lbs): 288 Respiratory Rate (breaths/min): 18 Body Mass Index (BMI): 37 Blood Pressure (mmHg): 128/72 Capillary Blood Glucose (mg/dl): 128 Reference Range: 80 - 120 mg / dl Electronic Signature(s) Signed: 04/05/2015 3:52:50 PM By: Lorine Bears RCP, RRT, CHT Entered By: Lorine Bears on 04/05/2015 13:15:20

## 2015-04-06 ENCOUNTER — Encounter: Payer: Managed Care, Other (non HMO) | Admitting: Internal Medicine

## 2015-04-06 DIAGNOSIS — E11621 Type 2 diabetes mellitus with foot ulcer: Secondary | ICD-10-CM | POA: Diagnosis not present

## 2015-04-06 LAB — GLUCOSE, CAPILLARY
Glucose-Capillary: 128 mg/dL — ABNORMAL HIGH (ref 65–99)
Glucose-Capillary: 137 mg/dL — ABNORMAL HIGH (ref 65–99)
Glucose-Capillary: 133 mg/dL — ABNORMAL HIGH (ref 65–99)

## 2015-04-07 ENCOUNTER — Encounter: Payer: Managed Care, Other (non HMO) | Admitting: Internal Medicine

## 2015-04-07 DIAGNOSIS — E11621 Type 2 diabetes mellitus with foot ulcer: Secondary | ICD-10-CM | POA: Diagnosis not present

## 2015-04-07 LAB — GLUCOSE, CAPILLARY
Glucose-Capillary: 270 mg/dL — ABNORMAL HIGH (ref 65–99)
Glucose-Capillary: 212 mg/dL — ABNORMAL HIGH (ref 65–99)

## 2015-04-07 NOTE — Progress Notes (Signed)
[name], [name] ([id number]) Visit Report for 04/06/2015 Arrival Information Details [name] Date of Service: 04/06/2015 1:30 PM Patient Name: J. Patient Account Number: [id number] Medical Record Treating RN: [id number] Number: Other Clinician: [name] Date of Birth/Sex: 11-Dec-1965 (50 y.o. Male) Treating Dellia Nims, [place] Primary Care Physician/Extender: Floyde Parkins Physician: Referring Physician: Sharilyn Sites in Treatment: 12 Visit Information History Since Last Visit Added or deleted any medications: No Patient Arrived: Ambulatory Any new allergies or adverse reactions: No Arrival Time: 12:47 Had a fall or experienced change in No Accompanied By: self activities of daily living that may affect Transfer Assistance: None risk of falls: Patient Identification Verified: Yes Signs or symptoms of abuse/neglect No Secondary Verification Process Yes since last visito Completed: Hospitalized since last visit: No Patient Requires Transmission-Based No Has Dressing in Place as Prescribed: Yes Precautions: Has Footwear/Offloading in Place as Yes Patient Has Alerts: Yes Prescribed: Left: Surgical Shoe with Pressure Relief Insole Pain Present Now: No Electronic Signature(s) Signed: 04/06/2015 4:08:51 PM By: Lorine Bears RCP, RRT, CHT Entered By: Becky Sax, Amado Nash on 04/06/2015 13:02:50 [name] ([id number]) -------------------------------------------------------------------------------- Encounter Discharge Information Details [name] Date of Service: 04/06/2015 1:30 PM Patient Name: J. Patient Account Number: [id number] Medical Record Treating RN: [id number] Number: Other Clinician: [name] Date of Birth/Sex: Dec 03, 1965 (50 y.o. Male) Treating [name], [name] Primary Care Physician/Extender: Floyde Parkins Physician: Referring Physician: Sharilyn Sites in Treatment:  12 Encounter Discharge Information Items Discharge Pain Level: 0 Discharge Condition: Stable Ambulatory Status: Ambulatory Discharge Destination: Home Private Transportation: Auto Accompanied By: self Schedule Follow-up Appointment: No Medication Reconciliation completed and No provided to Patient/Care [name]: Clinical Summary of Care: Notes Patient has an HBO treatment scheduled on 04/07/15 at 13:00 pm. Electronic Signature(s) Signed: 04/06/2015 4:08:51 PM By: Lorine Bears RCP, RRT, CHT Entered By: Becky Sax, Amado Nash on 04/06/2015 15:27:21 [name] ([id number]) -------------------------------------------------------------------------------- Vitals Details [name] Date of Service: 04/06/2015 1:30 PM Patient Name: J. Patient Account Number: [id number] Medical Record Treating RN: [id number] Number: Other Clinician: [name] Date of Birth/Sex: [birth year] (50 y.o. Male) Treating [name], [name] Primary Care Physician/Extender: Floyde Parkins Physician: Referring Physician: Sharilyn Sites in Treatment: 12 Vital Signs Time Taken: 12:50 Temperature (F): 98.2 Height (in): 74 Pulse (bpm): 96 Weight (lbs): 288 Respiratory Rate (breaths/min): 18 Body Mass Index (BMI): 37 Blood Pressure (mmHg): 142/72 Capillary Blood Glucose (mg/dl): 128 Reference Range: 80 - 120 mg / dl Electronic Signature(s) Signed: 04/06/2015 4:08:51 PM By: Lorine Bears RCP, RRT, CHT Entered By: Becky Sax, Amado Nash on 04/06/2015 13:03:23

## 2015-04-07 NOTE — Progress Notes (Signed)
[name] ([id number]) Visit Report for 04/06/2015 HBO Details [name] Date of Service: 04/06/2015 1:30 PM Patient Name: J. Patient Account Number: [id number] Medical Record Treating RN: [id number] Number: Other Clinician: Jacqulyn [name] Date of Birth/Sex: 01-25-1965 (50 y.o. Male) Treating [name] Primary Care Physician/Extender: [name] Physician: Referring Physician: Sharilyn [name] in Treatment: 12 HBO Treatment Course Details Treatment Course Ordering Physician: [name] 1 Number: HBO Treatment Start Date: 02/01/2015 Total Treatments 60 Ordered: HBO Indication: Diabetic Ulcer(s) of the Lower Extremity Standard/Conservative Wound Care tried and failed greater than or equal to 30 days Wound #2 Right, Lateral Metatarsal head fifth HBO Treatment Details Treatment Number: 36 Patient Type: Outpatient Chamber Type: Monoplace Chamber Serial #: [id number] Treatment Protocol: 2.0 ATA with 90 minutes oxygen, and no air breaks Treatment Details Compression Rate Down: 1.5 psi / minute De-Compression Rate Up: 1.5 psi / minute Air breaks and breathing Compress Tx Pressure periods Decompress Decompress Begins Reached (leave unused spaces Begins Ends blank) Chamber Pressure (ATA) 1 2 - - - - - - 2 1 Clock Time (24 hr) 13:22 13:32 - - - - - - 15:02 15:13 Treatment Length: 111 (minutes) Treatment Segments: 4 Capillary Blood Glucose Pre Capillary Blood Glucose (mg/dl): Post Capillary Blood Glucose (mg/dl): Vital Signs Capillary Blood Glucose Reference Range: 80 - 120 mg / dl HBO Diabetic Blood Glucose Intervention Range: <131 mg/dl or >249 mg/dl Time Vitals Blood Respiratory Capillary Blood Glucose Pulse Action Type: Pulse: Temperature: Taken: Pressure: Rate: Glucose (mg/dl): Meter #: Oximetry (%) Taken: [name], [name] ([id number]) Pre 12:50 142/72 96 18 98.2 128 1 Gave Ensure per protocol Post 15:17 142/64 84 18 98 133 1  none Treatment Response Treatment Completion Status: Treatment Completed without Adverse Event Physician Notes No concerns with treatment given HBO Attestation I certify that I supervised this HBO treatment in accordance with Medicare guidelines. A trained Yes emergency response team is readily available per hospital policies and procedures. Continue HBOT as ordered. Yes Electronic Signature(s) Signed: 04/07/2015 7:58:31 AM By: [name] Ham MD Previous Signature: 04/06/2015 4:08:51 PM Version By: [name], [name] Entered By: [name] on 04/06/2015 16:11:21 [name] ([id number]) -------------------------------------------------------------------------------- HBO Safety Checklist Details [name] Date of Service: 04/06/2015 1:30 PM Patient Name: J. Patient Account Number: [id number] Medical Record Treating RN: [id number] Number: Other Clinician: Jacqulyn [name] Date of Birth/Sex: Sep 15, 1965 (50 y.o. Male) Treating [name] Primary Care Physician/Extender: [name] Physician: Referring Physician: Sharilyn [name] in Treatment: 12 HBO Safety Checklist Items Safety Checklist Consent Form Signed Patient voided / foley secured and emptied When did you last eato 09:00 am Last dose of injectable or oral agent 07:00 am NA Ostomy pouch emptied and vented if applicable NA All implantable devices assessed, documented and approved Intravenous access site secured and place Valuables secured Linens and cotton and cotton/polyester blend (less than 51% polyester) Personal oil-based products / skin lotions / body lotions removed Wigs or hairpieces removed Smoking or tobacco materials removed Books / newspapers / magazines / loose paper removed Cologne, aftershave, perfume and deodorant removed Jewelry removed (may wrap wedding band) Make-up removed Hair care products removed Battery operated devices (external)  removed Heating patches and chemical warmers removed NA Titanium eyewear removed NA Nail polish cured greater than 10 hours NA Casting material cured greater than 10 hours NA Hearing aids removed NA Loose dentures or partials removed NA Prosthetics have been removed Patient demonstrates correct use of air break device (if applicable) Patient  concerns have been addressed Patient grounding bracelet on and cord attached to chamber Specifics for Inpatients (complete in addition to above) Medication sheet sent with patient [name] ([id number]) Intravenous medications needed or due during therapy sent with patient Drainage tubes (e.g. nasogastric tube or chest tube secured and vented) Endotracheal or Tracheotomy tube secured Cuff deflated of air and inflated with saline Airway suctioned Electronic Signature(s) Signed: 04/06/2015 4:08:51 PM By: Lorine Bears RCP, RRT, [name] Entered By: Lorine Bears on 04/06/2015 13:04:04

## 2015-04-07 NOTE — Progress Notes (Signed)
[name], BRANIGAN ([id number]) Visit Report for 04/02/2015 Arrival Information Details Patient Name: [name], [name]. Date of Service: 04/02/2015 12:45 PM Medical Record Number: [id number] Patient Account Number: [id number] Date of Birth/Sex: 1965-09-27 (50 y.o. Male) Treating RN: Ahmed Prima Primary Care Physician: [name] Other Clinician: Referring Physician: [name] Treating Physician/Extender: Frann Rider in Treatment: 12 Visit Information History Since Last Visit All ordered tests and consults were completed: No Patient Arrived: Ambulatory Added or deleted any medications: No Arrival Time: 12:49 Any new allergies or adverse reactions: No Accompanied By: self Had a fall or experienced change in No Transfer Assistance: None activities of daily living that may affect Patient Identification Verified: Yes risk of falls: Secondary Verification Process Yes Signs or symptoms of abuse/neglect since last No Completed: visito Patient Requires Transmission-Based No Hospitalized since last visit: No Precautions: Pain Present Now: No Patient Has Alerts: Yes Electronic Signature(s) Signed: 04/06/2015 4:28:40 PM By: Alric Quan Entered By: Alric Quan on 04/02/2015 13:25:42 [name] ([id number]) -------------------------------------------------------------------------------- Clinic Level of Care Assessment Details Patient Name: [name] Date of Service: 04/02/2015 12:45 PM Medical Record Number: [id number] Patient Account Number: [id number] Date of Birth/Sex: 1965/01/26 (50 y.o. Male) Treating RN: Ahmed Prima Primary Care Physician: [name] Other Clinician: Referring Physician: [name] Treating Physician/Extender: Frann Rider in Treatment: 12 Clinic Level of Care Assessment Items TOOL 4 Quantity Score []  - Use when only an EandM is performed on FOLLOW-UP visit 0 ASSESSMENTS -  Nursing Assessment / Reassessment []  - Reassessment of Co-morbidities (includes updates in patient status) 0 X - Reassessment of Adherence to Treatment Plan 1 5 ASSESSMENTS - Wound and Skin Assessment / Reassessment X - Simple Wound Assessment / Reassessment - one wound 1 5 []  - Complex Wound Assessment / Reassessment - multiple wounds 0 []  - Dermatologic / Skin Assessment (not related to wound area) 0 ASSESSMENTS - Focused Assessment []  - Circumferential Edema Measurements - multi extremities 0 []  - Nutritional Assessment / Counseling / Intervention 0 []  - Lower Extremity Assessment (monofilament, tuning fork, pulses) 0 []  - Peripheral Arterial Disease Assessment (using hand held doppler) 0 ASSESSMENTS - Ostomy and/or Continence Assessment and Care []  - Incontinence Assessment and Management 0 []  - Ostomy Care Assessment and Management (repouching, etc.) 0 PROCESS - Coordination of Care X - Simple Patient / Family Education for ongoing care 1 15 []  - Complex (extensive) Patient / Family Education for ongoing care 0 []  - Staff obtains Programmer, systems, Records, Test Results / Process Orders 0 []  - Staff telephones HHA, Nursing Homes / Clarify orders / etc 0 []  - Routine Transfer to another Facility (non-emergent condition) 0 MILEZ, LAGUEUX ([id number]) []  - Routine Hospital Admission (non-emergent condition) 0 []  - New Admissions / Biomedical engineer / Ordering NPWT, Apligraf, etc. 0 []  - Emergency Hospital Admission (emergent condition) 0 X - Simple Discharge Coordination 1 10 []  - Complex (extensive) Discharge Coordination 0 PROCESS - Special Needs []  - Pediatric / Minor Patient Management 0 []  - Isolation Patient Management 0 []  - Hearing / Language / Visual special needs 0 []  - Assessment of Community assistance (transportation, D/C planning, etc.) 0 []  - Additional assistance / Altered mentation 0 []  - Support Surface(s) Assessment (bed, cushion, seat, etc.) 0 INTERVENTIONS  - Wound Cleansing / Measurement X - Simple Wound Cleansing - one wound 1 5 []  - Complex Wound Cleansing - multiple wounds 0 X - Wound Imaging (photographs - any number of wounds) 1 5 []  - Wound  Tracing (instead of photographs) 0 X - Simple Wound Measurement - one wound 1 5 []  - Complex Wound Measurement - multiple wounds 0 INTERVENTIONS - Wound Dressings X - Small Wound Dressing one or multiple wounds 1 10 []  - Medium Wound Dressing one or multiple wounds 0 []  - Large Wound Dressing one or multiple wounds 0 X - Application of Medications - topical 1 5 []  - Application of Medications - injection 0 INTERVENTIONS - Miscellaneous []  - External ear exam 0 TYRES, WIERZBA ([id number]) []  - Specimen Collection (cultures, biopsies, blood, body fluids, etc.) 0 []  - Specimen(s) / Culture(s) sent or taken to Lab for analysis 0 []  - Patient Transfer (multiple staff / Harrel Lemon Lift / Similar devices) 0 []  - Simple Staple / Suture removal (25 or less) 0 []  - Complex Staple / Suture removal (26 or more) 0 []  - Hypo / Hyperglycemic Management (close monitor of Blood Glucose) 0 []  - Ankle / Brachial Index (ABI) - do not check if billed separately 0 X - Vital Signs 1 5 Has the patient been seen at the hospital within the last three years: Yes Total Score: 70 Level Of Care: New/Established - Level 2 Electronic Signature(s) Signed: 04/06/2015 4:28:40 PM By: Alric Quan Entered By: Alric Quan on 04/02/2015 14:15:31 [name] ([id number]) -------------------------------------------------------------------------------- Encounter Discharge Information Details Patient Name: [name] Date of Service: 04/02/2015 12:45 PM Medical Record Number: [id number] Patient Account Number: [id number] Date of Birth/Sex: 09-Apr-1965 (50 y.o. Male) Treating RN: Ahmed Prima Primary Care Physician: [name] Other Clinician: Referring Physician: [name] Treating  Physician/Extender: Frann Rider in Treatment: 12 Encounter Discharge Information Items Discharge Pain Level: 0 Discharge Condition: Stable Ambulatory Status: Ambulatory Discharge Destination: Home Transportation: Private Auto Accompanied By: self Schedule Follow-up Appointment: Yes Medication Reconciliation completed Yes and provided to Patient/Care Taneia Mealor: Patient Clinical Summary of Care: Declined Electronic Signature(s) Signed: 04/02/2015 1:18:31 PM By: Ruthine Dose Entered By: Ruthine Dose on 04/02/2015 13:18:31 [name] ([id number]) -------------------------------------------------------------------------------- Lower Extremity Assessment Details Patient Name: [name] Date of Service: 04/02/2015 12:45 PM Medical Record Number: [id number] Patient Account Number: [id number] Date of Birth/Sex: Apr 14, 1965 (50 y.o. Male) Treating RN: Ahmed Prima Primary Care Physician: [name] Other Clinician: Referring Physician: [name] Treating Physician/Extender: Frann Rider in Treatment: 12 Vascular Assessment Pulses: Posterior Tibial Dorsalis Pedis Palpable: [Right:Yes] Extremity colors, hair growth, and conditions: Extremity Color: [Right:Normal] Temperature of Extremity: [Right:Warm] Capillary Refill: [Right:< 3 seconds] Electronic Signature(s) Signed: 04/06/2015 4:28:40 PM By: Alric Quan Entered By: Alric Quan on 04/02/2015 12:59:43 [name] ([id number]) -------------------------------------------------------------------------------- Multi Wound Chart Details Patient Name: [name] Date of Service: 04/02/2015 12:45 PM Medical Record Number: [id number] Patient Account Number: [id number] Date of Birth/Sex: 23-May-1965 (50 y.o. 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] Comorbid History: [2:Hypertension, Type II Diabetes, Neuropathy] [N/A:N/A] Date Acquired: [2:02/19/2015] [N/A:N/A] Weeks of Treatment: [2:6] [N/A:N/A] Wound Status: [2:Open] [N/A:N/A] Measurements L x W x D 2x0.5x0.1 [N/A:N/A] (cm) Area (cm) : [2:0.785] [N/A:N/A] Volume (cm) : [2:0.079] [N/A:N/A] Classification: [2:Grade 3] [N/A:N/A] Wagner Verification: [2:Abscess] [N/A:N/A] Exudate Amount: [  2:Large] [N/A:N/A] Exudate Type: [2:Serosanguineous] [N/A:N/A] Exudate Color: [2:red, brown] [N/A:N/A] Wound Margin: [2:Flat and Intact] [N/A:N/A] Granulation Amount: [2:Medium (34-66%)] [N/A:N/A] Granulation Quality: [2:Red] [N/A:N/A] Necrotic Amount: [2:Medium (34-66%)] [N/A:N/A] Exposed Structures: [2:Fascia: No Fat: No Tendon: No Muscle: No Joint: No Bone: No] [N/A:N/A] Limited to Skin Breakdown Epithelialization: None N/A N/A Periwound Skin Texture: Edema: No N/A N/A Excoriation: No Induration: No Callus: No Crepitus: No Fluctuance: No Friable: No Rash: No Scarring: No Periwound Skin Moist: Yes N/A N/A Moisture: Maceration: No Dry/Scaly: No Periwound Skin Color: Erythema: Yes N/A N/A Atrophie Blanche: No Cyanosis: No Ecchymosis: No Hemosiderin Staining: No Mottled: No Pallor: No Rubor: No Erythema Location: Circumferential N/A N/A Temperature: Hot N/A N/A Tenderness on Yes N/A N/A Palpation: Wound Preparation: Ulcer Cleansing: N/A N/A Rinsed/Irrigated with Saline Topical Anesthetic Applied:  Other: lidocaine 4% Treatment Notes Electronic Signature(s) Signed: 04/06/2015 4:28:40 PM By: Alric Quan Entered By: Alric Quan on 04/02/2015 12:59:59 [name] ([id number]) -------------------------------------------------------------------------------- Melvin Details Patient Name: BRYTON, KEELEN. Date of Service: 04/02/2015 12:45 PM Medical Record Number: [id number] Patient Account Number: [id number] Date of Birth/Sex: Jan 29, 1965 (50 y.o. Male) Treating RN: Ahmed Prima Primary Care Physician: [name] Other Clinician: Referring Physician: [name] Treating Physician/Extender: Frann Rider in Treatment: 12 Active Inactive HBO Nursing Diagnoses: Anxiety related to feelings of confinement associated with the hyperbaric oxygen chamber Anxiety related to knowledge deficit of hyperbaric oxygen therapy and treatment procedures Discomfort related to temperature and humidity changes inside hyperbaric chamber Potential for barotraumas to ears, sinuses, teeth, and lungs or cerebral gas embolism related to changes in atmospheric pressure inside hyperbaric oxygen chamber Potential for oxygen toxicity seizures related to delivery of 100% oxygen at an increased atmospheric pressure Potential for pulmonary oxygen toxicity related to delivery of 100% oxygen at an increased atmospheric pressure Goals: Barotrauma will be prevented during HBO2 Date Initiated: 01/07/2015 Goal Status: Active Patient and/or family will be able to state/discuss factors appropriate to the management of their disease process during treatment Date Initiated: 01/07/2015 Goal Status: Active Patient will tolerate the hyperbaric oxygen therapy treatment Date Initiated: 01/07/2015 Goal Status: Active Patient will tolerate the internal climate of the chamber Date Initiated: 01/07/2015 Goal Status: Active Patient/caregiver will verbalize understanding of  HBO goals, rationale, procedures and potential hazards Date Initiated: 01/07/2015 Goal Status: Active Signs and symptoms of pulmonary oxygen toxicity will be recognized and promptly addressed Date Initiated: 01/07/2015 Goal Status: Active Signs and symptoms of seizure will be recognized and promptly addressed ; seizing patients will suffer no harm Date Initiated: 01/07/2015 [name] ([id number]) Goal Status: Active Interventions: Administer a five (5) minute air break for patient if signs and symptoms of seizure appear and notify the hyperbaric physician Administer a ten (10) minute air break for patient if signs and symptoms of seizure appear and notify the hyperbaric physician Administer decongestants, per physician orders, prior to HBO2 Administer the correct therapeutic gas delivery based on the patients needs and limitations, per physician order Assess and provide for patientos comfort related to the hyperbaric environment and equalization of middle ear Assess for signs and symptoms related to adverse events, including but not limited to confinement anxiety, pneumothorax, oxygen toxicity and baurotrauma Assess patient for any history of confinement anxiety Assess patient's knowledge and expectations regarding hyperbaric medicine and provide education related to the hyperbaric environment, goals of treatment and prevention of adverse events Implement protocols to decrease risk of pneumothorax in high risk patients Notes: Orientation to the Wound Care Program Nursing  Diagnoses: Knowledge deficit related to the wound healing center program Goals: Patient/caregiver will verbalize understanding of the [facility] Date Initiated: 01/07/2015 Goal Status: Active Interventions: Provide education on orientation to the wound center Notes: Peripheral Neuropathy Nursing Diagnoses: Knowledge deficit related to disease process and management of peripheral  neurovascular dysfunction Potential alteration in peripheral tissue perfusion (select prior to confirmation of diagnosis) Goals: Patient/caregiver will verbalize understanding of disease process and disease management Date Initiated: 01/07/2015 Goal Status: Active PETROS, RAIA ([id number]) Interventions: Assess signs and symptoms of neuropathy upon admission and as needed Provide education on Management of Neuropathy and Related Ulcers Provide education on Management of Neuropathy upon discharge from the [facility] for HBO Treatment Activities: Consult for HBO : 04/02/2015 Patient referred for customized footwear/offloading : 04/02/2015 Notes: Wound/Skin Impairment Nursing Diagnoses: Impaired tissue integrity Knowledge deficit related to ulceration/compromised skin integrity Goals: Patient/caregiver will verbalize understanding of skin care regimen Date Initiated: 01/07/2015 Goal Status: Active Ulcer/skin breakdown will have a volume reduction of 30% by week 4 Date Initiated: 01/07/2015 Goal Status: Active Ulcer/skin breakdown will have a volume reduction of 50% by week 8 Date Initiated: 01/07/2015 Goal Status: Active Ulcer/skin breakdown will have a volume reduction of 80% by week 12 Date Initiated: 01/07/2015 Goal Status: Active Ulcer/skin breakdown will heal within 14 weeks Date Initiated: 01/07/2015 Goal Status: Active Interventions: Assess patient/caregiver ability to obtain necessary supplies Assess patient/caregiver ability to perform ulcer/skin care regimen upon admission and as needed Assess ulceration(s) every visit Provide education on ulcer and skin care Treatment Activities: Referred to DME Sabree Nuon for dressing supplies : 04/02/2015 Skin care regimen initiated : 04/02/2015 MIKEL, CRUS ([id number]) Topical wound management initiated : 04/02/2015 Notes: Electronic Signature(s) Signed: 04/06/2015 4:28:40 PM By: Alric Quan Entered By: Alric Quan on 04/02/2015 12:59:50 [name] ([id number]) -------------------------------------------------------------------------------- Pain Assessment Details Patient Name: [name] Date of Service: 04/02/2015 12:45 PM Medical Record Number: [id number] Patient Account Number: [id number] Date of Birth/Sex: April 05, 1965 (50 y.o. Male) Treating RN: Ahmed Prima Primary Care Physician: [name] Other Clinician: Referring Physician: [name] Treating Physician/Extender: Frann Rider in Treatment: 12 Active Problems Location of Pain Severity and Description of Pain Patient Has Paino No Site Locations Pain Management and Medication Current Pain Management: Electronic Signature(s) Signed: 04/06/2015 4:28:40 PM By: Alric Quan Entered By: Alric Quan on 04/02/2015 13:25:47 [name] ([id number]) -------------------------------------------------------------------------------- Patient/Caregiver Education Details Patient Name: [name] Date of Service: 04/02/2015 12:45 PM Medical Record Number: [id number] Patient Account Number: [id number] Date of Birth/Gender: Sep 10, 1965 (50 y.o. Male) Treating RN: Ahmed Prima Primary Care Physician: [name] Other Clinician: Referring Physician: [name] Treating Physician/Extender: Frann Rider in Treatment: 12 Education Assessment Education Provided To: Patient Education Topics Provided Wound/Skin Impairment: Handouts: Other: change dressing as ordered Methods: Demonstration, Explain/Verbal Responses: State content correctly Electronic Signature(s) Signed: 04/06/2015 4:28:40 PM By: Alric Quan Entered By: Alric Quan on 04/02/2015 13:17:39 [name] ([id number]) -------------------------------------------------------------------------------- Wound Assessment Details Patient Name: [name] Date of Service:  04/02/2015 12:45 PM Medical Record Number: [id number] Patient Account Number: [id number] Date of Birth/Sex: 20-Jul-1965 (50 y.o. Male) Treating RN: Ahmed Prima Primary Care Physician: [name] Other Clinician: Referring Physician: [name] Treating Physician/Extender: Frann Rider in Treatment: 12 Wound Status Wound Number: 2 Primary Diabetic Wound/Ulcer of the Lower Etiology: Extremity Wound Location: Right Metatarsal head fifth - Lateral Wound Status: Open Wounding Event: Bump Comorbid Hypertension, Type II Diabetes, History: Neuropathy Date Acquired: 02/19/2015 [place]  Of Treatment: 6 Clustered Wound: No Photos Photo Uploaded By: Alric Quan on 04/02/2015 17:15:50 Wound Measurements Length: (cm) 2 Width: (cm) 0.5 Depth: (cm) 0.1 Area: (cm) 0.785 Volume: (cm) 0.079 % Reduction in Area: % Reduction in Volume: Epithelialization: None Tunneling: No Undermining: No Wound Description Classification: Grade 3 Wagner Verification: Abscess Wound Margin: Flat and Intact Exudate Amount: Large Exudate Type: Serosanguineous Exudate Color: red, brown Foul Odor After Cleansing: No Wound Bed Granulation Amount: Medium (34-66%) Exposed Structure Granulation Quality: Red Fascia Exposed: No BAYRON, CARAVALHO ([id number]) Necrotic Amount: Medium (34-66%) Fat Layer Exposed: No Necrotic Quality: Adherent Slough Tendon Exposed: No Muscle Exposed: No Joint Exposed: No Bone Exposed: No Limited to Skin Breakdown Periwound Skin Texture Texture Color No Abnormalities Noted: No No Abnormalities Noted: No Callus: No Atrophie Blanche: No Crepitus: No Cyanosis: No Excoriation: No Ecchymosis: No Fluctuance: No Erythema: Yes Friable: No Erythema Location: Circumferential Induration: No Hemosiderin Staining: No Localized Edema: No Mottled: No Rash: No Pallor: No Scarring: No Rubor: No Moisture Temperature / Pain No Abnormalities Noted:  No Temperature: Hot Dry / Scaly: No Tenderness on Palpation: Yes Maceration: No Moist: Yes Wound Preparation Ulcer Cleansing: Rinsed/Irrigated with Saline Topical Anesthetic Applied: Other: lidocaine 4%, Treatment Notes Wound #2 (Right, Lateral Metatarsal head fifth) 1. Cleansed with: Clean wound with Normal Saline 2. Anesthetic Topical Lidocaine 4% cream to wound bed prior to debridement 3. Peri-wound Care: Skin Prep 4. Dressing Applied: Hydrafera Blue 5. Secondary Dressing Applied Bordered Foam Dressing Electronic Signature(s) Signed: 04/06/2015 4:28:40 PM By: Alric Quan Entered By: Alric Quan on 04/02/2015 12:59:17 GILLIS, HOLTHUSEN ([id number], CREAGER ([id number]) -------------------------------------------------------------------------------- Vitals Details Patient Name: [name] Date of Service: 04/02/2015 12:45 PM Medical Record Number: [id number] Patient Account Number: [id number] Date of Birth/Sex: May 03, 1965 (50 y.o. Male) Treating RN: Ahmed Prima Primary Care Physician: [name] Other Clinician: Referring Physician: [name] Treating Physician/Extender: Frann Rider in Treatment: 12 Vital Signs Time Taken: 12:15 Temperature (F): 98.0 Height (in): 74 Pulse (bpm): 84 Weight (lbs): 288 Respiratory Rate (breaths/min): 18 Body Mass Index (BMI): 37 Blood Pressure (mmHg): 132/72 Capillary Blood Glucose (mg/dl): 129 Reference Range: 80 - 120 mg / dl Electronic Signature(s) Signed: 04/06/2015 4:28:40 PM By: Alric Quan Entered By: Alric Quan on 04/02/2015 12:49:53

## 2015-04-08 ENCOUNTER — Encounter: Payer: Managed Care, Other (non HMO) | Admitting: Surgery

## 2015-04-08 DIAGNOSIS — E11621 Type 2 diabetes mellitus with foot ulcer: Secondary | ICD-10-CM | POA: Diagnosis not present

## 2015-04-08 LAB — GLUCOSE, CAPILLARY
Glucose-Capillary: 145 mg/dL — ABNORMAL HIGH (ref 65–99)
Glucose-Capillary: 111 mg/dL — ABNORMAL HIGH (ref 65–99)

## 2015-04-08 NOTE — Progress Notes (Signed)
AUN, VERRICO ([id number]) Visit Report for 04/07/2015 HBO Details [name] Date of Service: 04/07/2015 1:30 PM Patient Name: J. Patient Account Number: [id number] Medical Record Treating RN: [id number] Number: Other Clinician: [name] Date of Birth/Sex: [birth year] (50 y.o. Male) Treating [name], [name] Primary Care Physician/Extender: Floyde Parkins Physician: Referring Physician: Sharilyn Sites in Treatment: 12 HBO Treatment Course Details Treatment Course Ordering Physician: Christin Fudge 1 Number: HBO Treatment Start Date: 02/01/2015 Total Treatments 60 Ordered: HBO Indication: Diabetic Ulcer(s) of the Lower Extremity Standard/Conservative Wound Care tried and failed greater than or equal to 30 days Wound #2 Right, Lateral Metatarsal head fifth HBO Treatment Details Treatment Number: 37 Patient Type: Outpatient Chamber Type: Monoplace Chamber Serial #: [id number] Treatment Protocol: 2.0 ATA with 90 minutes oxygen, and no air breaks Treatment Details Compression Rate Down: 1.5 psi / minute De-Compression Rate Up: 1.5 psi / minute Air breaks and breathing Compress Tx Pressure periods Decompress Decompress Begins Reached (leave unused spaces Begins Ends blank) Chamber Pressure (ATA) 1 2 - - - - - - 2 1 Clock Time (24 hr) 13:03 13:13 - - - - - - 14:43 14:53 Treatment Length: 110 (minutes) Treatment Segments: 4 Capillary Blood Glucose Pre Capillary Blood Glucose (mg/dl): Post Capillary Blood Glucose (mg/dl): Vital Signs Capillary Blood Glucose Reference Range: 80 - 120 mg / dl HBO Diabetic Blood Glucose Intervention Range: <131 mg/dl or >249 mg/dl Time Vitals Blood Respiratory Capillary Blood Glucose Pulse Action Type: Pulse: Temperature: Taken: Pressure: Rate: Glucose (mg/dl): Meter #: Oximetry (%) Taken: [name], [name] ([id number]) Pre 12:49 146/74 96 18 98.2 270 1 none Post 14:57 142/72 84 18 98.2 212 1 none Treatment  Response Treatment Completion Status: Treatment Completed without Adverse Event Physician Notes No concerns with treatment given HBO Attestation I certify that I supervised this HBO treatment in accordance with Medicare guidelines. A trained Yes emergency response team is readily available per hospital policies and procedures. Continue HBOT as ordered. Yes Electronic Signature(s) Signed: 04/07/2015 4:30:01 PM By: Linton Ham MD Previous Signature: 04/07/2015 3:26:48 PM Version By: Becky Sax, Sallie RCP, RRT, CHT Entered By: Linton Ham on 04/07/2015 16:29:06 [name] ([id number]) -------------------------------------------------------------------------------- HBO Safety Checklist Details [name] Date of Service: 04/07/2015 1:30 PM Patient Name: J. Patient Account Number: [id number] Medical Record Treating RN: [id number] Number: Other Clinician: [name] Date of Birth/Sex: [birth year] (50 y.o. Male) Treating [name], [name] Primary Care Physician/Extender: Floyde Parkins Physician: Referring Physician: Sharilyn Sites in Treatment: 12 HBO Safety Checklist Items Safety Checklist Consent Form Signed Patient voided / foley secured and emptied When did you last eato 09:30 am Last dose of injectable or oral agent 07:00 am NA Ostomy pouch emptied and vented if applicable NA All implantable devices assessed, documented and approved Intravenous access site secured and place PICC line Valuables secured Linens and cotton and cotton/polyester blend (less than 51% polyester) Personal oil-based products / skin lotions / body lotions removed Wigs or hairpieces removed Smoking or tobacco materials removed Books / newspapers / magazines / loose paper removed Cologne, aftershave, perfume and deodorant removed Jewelry removed (may wrap wedding band) Make-up removed Hair care products removed Battery operated devices (external)  removed Heating patches and chemical warmers removed NA Titanium eyewear removed NA Nail polish cured greater than 10 hours NA Casting material cured greater than 10 hours NA Hearing aids removed NA Loose dentures or partials removed NA Prosthetics have been removed Patient demonstrates correct use of air break device (if applicable) Patient concerns  have been addressed Patient grounding bracelet on and cord attached to chamber Specifics for Inpatients (complete in addition to above) Medication sheet sent with patient SUHAS, KIRKENDOLL ([id number]) Intravenous medications needed or due during therapy sent with patient Drainage tubes (e.g. nasogastric tube or chest tube secured and vented) Endotracheal or Tracheotomy tube secured Cuff deflated of air and inflated with saline Airway suctioned Electronic Signature(s) Signed: 04/07/2015 3:26:48 PM By: Lorine Bears RCP, RRT, CHT Entered By: Lorine Bears on 04/07/2015 13:19:18

## 2015-04-08 NOTE — Progress Notes (Signed)
SHADI, SCOBEY ([id number]) Visit Report for 04/07/2015 Arrival Information Details [name] Date of Service: 04/07/2015 1:30 PM Patient Name: J. Patient Account Number: [id number] Medical Record Treating RN: [id number] Number: Other Clinician: [name] Date of Birth/Sex: [birth year] (50 y.o. Male) Treating Dellia Nims, [place] Primary Care Physician/Extender: Floyde Parkins Physician: Referring Physician: Sharilyn Sites in Treatment: 12 Visit Information History Since Last Visit Added or deleted any medications: No Patient Arrived: Ambulatory Any new allergies or adverse reactions: No Arrival Time: 12:47 Had a fall or experienced change in No Accompanied By: self activities of daily living that may affect Transfer Assistance: None risk of falls: Patient Identification Verified: Yes Signs or symptoms of abuse/neglect No Secondary Verification Process Yes since last visito Completed: Hospitalized since last visit: No Patient Requires Transmission-Based No Has Dressing in Place as Prescribed: Yes Precautions: Has Footwear/Offloading in Place as Yes Patient Has Alerts: Yes Prescribed: Left: Surgical Shoe with Pressure Relief Insole Pain Present Now: No Electronic Signature(s) Signed: 04/07/2015 3:26:48 PM By: Lorine Bears RCP, RRT, CHT Entered By: Becky Sax, Amado Nash on 04/07/2015 13:17:42 [name] ([id number]) -------------------------------------------------------------------------------- Encounter Discharge Information Details [name] Date of Service: 04/07/2015 1:30 PM Patient Name: J. Patient Account Number: [id number] Medical Record Treating RN: [id number] Number: Other Clinician: [name] Date of Birth/Sex: [birth year] (50 y.o. Male) Treating [name], [name] Primary Care Physician/Extender: Floyde Parkins Physician: Referring Physician: Sharilyn Sites in Treatment:  12 Encounter Discharge Information Items Discharge Pain Level: 0 Discharge Condition: Stable Ambulatory Status: Ambulatory Discharge Destination: Home Private Transportation: Auto Accompanied By: self Schedule Follow-up Appointment: No Medication Reconciliation completed and No provided to Patient/Care Andrika Peraza: Clinical Summary of Care: Notes Patient has an HBO treatment scheduled on 04/08/15 at 13:00 pm. Electronic Signature(s) Signed: 04/07/2015 3:26:48 PM By: Lorine Bears RCP, RRT, CHT Entered By: Becky Sax, Amado Nash on 04/07/2015 15:25:34 [name] ([id number]) -------------------------------------------------------------------------------- Vitals Details [name] Date of Service: 04/07/2015 1:30 PM Patient Name: J. Patient Account Number: [id number] Medical Record Treating RN: [id number] Number: Other Clinician: [name] Date of Birth/Sex: [birth year] (50 y.o. Male) Treating [name], [name] Primary Care Physician/Extender: Floyde Parkins Physician: Referring Physician: Sharilyn Sites in Treatment: 12 Vital Signs Time Taken: 12:49 Temperature (F): 98.2 Height (in): 74 Pulse (bpm): 96 Weight (lbs): 288 Respiratory Rate (breaths/min): 18 Body Mass Index (BMI): 37 Blood Pressure (mmHg): 146/74 Capillary Blood Glucose (mg/dl): 270 Reference Range: 80 - 120 mg / dl Electronic Signature(s) Signed: 04/07/2015 3:26:48 PM By: Lorine Bears RCP, RRT, CHT Entered By: Lorine Bears on 04/07/2015 13:18:10

## 2015-04-09 ENCOUNTER — Encounter: Payer: Managed Care, Other (non HMO) | Admitting: Surgery

## 2015-04-09 DIAGNOSIS — E11621 Type 2 diabetes mellitus with foot ulcer: Secondary | ICD-10-CM | POA: Diagnosis not present

## 2015-04-09 LAB — GLUCOSE, CAPILLARY
Glucose-Capillary: 159 mg/dL — ABNORMAL HIGH (ref 65–99)
Glucose-Capillary: 111 mg/dL — ABNORMAL HIGH (ref 65–99)

## 2015-04-09 NOTE — Progress Notes (Signed)
[name], [name] ([id number]) Visit Report for 04/08/2015 Arrival Information Details Patient Name: [name], [name]. Date of Service: 04/08/2015 1:00 PM Medical Record Number: [id number] Patient Account Number: [id number] Date of Birth/Sex: 09-21-1965 (50 y.o. Male) Treating RN: Primary Care Physician: [name] Other Clinician: [name] Referring Physician: [name] Treating Physician/Extender: Frann Rider in Treatment: 13 Visit Information History Since Last Visit Added or deleted any medications: No Patient Arrived: Ambulatory Any new allergies or adverse reactions: No Arrival Time: 12:47 Had a fall or experienced change in No Accompanied By: self activities of daily living that may affect Transfer Assistance: None risk of falls: Patient Identification Verified: Yes Signs or symptoms of abuse/neglect No Secondary Verification Process Yes since last visito Completed: Hospitalized since last visit: No Patient Requires Transmission-Based No Has Dressing in Place as Prescribed: Yes Precautions: Has Footwear/Offloading in Place as Yes Patient Has Alerts: Yes Prescribed: Left: Surgical Shoe with Pressure Relief Insole Pain Present Now: No Electronic Signature(s) Signed: 04/08/2015 3:15:02 PM By: Lorine Bears RCP, RRT, CHT Entered By: Lorine Bears on 04/08/2015 13:04:10 [name] ([id number]) -------------------------------------------------------------------------------- Encounter Discharge Information Details Patient Name: [name] Date of Service: 04/08/2015 1:00 PM Medical Record Number: [id number] Patient Account Number: [id number] Date of Birth/Sex: 1965/05/25 (50 y.o. Male) Treating RN: Primary Care Physician: [name] Other Clinician: [name] Referring Physician: [name] Treating Physician/Extender: Frann Rider in Treatment: 38 Encounter  Discharge Information Items Discharge Pain Level: 0 Discharge Condition: Stable Ambulatory Status: Ambulatory Discharge Destination: Home Private Transportation: Auto Accompanied By: self Schedule Follow-up Appointment: No Medication Reconciliation completed and No provided to Patient/Care Wauneta Silveria: Clinical Summary of Care: Notes Patient has an HBO treatment scheduled on 04/09/15 at 13:30 pm. Electronic Signature(s) Signed: 04/08/2015 3:15:02 PM By: Lorine Bears RCP, RRT, CHT Entered By: Lorine Bears on 04/08/2015 15:14:12 [name] ([id number]) -------------------------------------------------------------------------------- Vitals Details Patient Name: [name] Date of Service: 04/08/2015 1:00 PM Medical Record Number: [id number] Patient Account Number: [id number] Date of Birth/Sex: Nov 23, 1965 (50 y.o. Male) Treating RN: Primary Care Physician: [name] Other Clinician: [name] Referring Physician: [name] Treating Physician/Extender: Frann Rider in Treatment: 13 Vital Signs Time Taken: 13:01 Temperature (F): 98.4 Height (in): 74 Pulse (bpm): 90 Weight (lbs): 288 Respiratory Rate (breaths/min): 18 Body Mass Index (BMI): 37 Blood Pressure (mmHg): 142/66 Capillary Blood Glucose (mg/dl): 145 Reference Range: 80 - 120 mg / dl Electronic Signature(s) Signed: 04/08/2015 3:15:02 PM By: Lorine Bears RCP, RRT, CHT Entered By: Becky Sax, Amado Nash on 04/08/2015 13:04:42

## 2015-04-09 NOTE — Progress Notes (Signed)
[name], [name] ([id number]) Visit Report for 04/08/2015 HBO Details Patient Name: [name], [name]. Date of Service: 04/08/2015 1:00 PM Medical Record Number: [id number] Patient Account Number: [id number] Date of Birth/Sex: 12-Aug-1965 (50 y.o. Male) Treating RN: Primary Care Physician: [name] Other Clinician: [name] Referring Physician: [name] Treating Physician/Extender: Frann Rider in Treatment: 13 HBO Treatment Course Details Treatment Course Ordering Physician: Christin Fudge 1 Number: HBO Treatment Start Date: 02/01/2015 Total Treatments 60 Ordered: HBO Indication: Diabetic Ulcer(s) of the Lower Extremity Standard/Conservative Wound Care tried and failed greater than or equal to 30 days Wound #2 Right, Lateral Metatarsal head fifth HBO Treatment Details Treatment Number: 38 Patient Type: Outpatient Chamber Type: Monoplace Chamber Serial #: [id number] Treatment Protocol: 2.0 ATA with 90 minutes oxygen, and no air breaks Treatment Details Compression Rate Down: 1.5 psi / minute De-Compression Rate Up: 1.5 psi / minute Air breaks and breathing Compress Tx Pressure periods Decompress Decompress Begins Reached (leave unused spaces Begins Ends blank) Chamber Pressure (ATA) 1 2 - - - - - - 2 1 Clock Time (24 hr) 13:01 13:11 - - - - - - 14:42 14:52 Treatment Length: 111 (minutes) Treatment Segments: 4 Capillary Blood Glucose Pre Capillary Blood Glucose (mg/dl): Post Capillary Blood Glucose (mg/dl): Vital Signs Capillary Blood Glucose Reference Range: 80 - 120 mg / dl HBO Diabetic Blood Glucose Intervention Range: <131 mg/dl or >249 mg/dl Time Vitals Blood Respiratory Capillary Blood Glucose Pulse Action Type: Pulse: Temperature: Taken: Pressure: Rate: Glucose (mg/dl): Meter #: Oximetry (%) Taken: Pre 13:01 142/66 90 18 98.4 145 1 none Post 14:55 142/62 90 18 97.7 111 1 none [name], [name] J. ([id number]) Treatment  Response Treatment Completion Status: Treatment Completed without Adverse Event HBO Attestation I certify that I supervised this HBO treatment in accordance with Medicare guidelines. A trained Yes emergency response team is readily available per hospital policies and procedures. Continue HBOT as ordered. Yes Electronic Signature(s) Signed: 04/08/2015 4:28:16 PM By: Christin Fudge MD, FACS Previous Signature: 04/08/2015 3:15:02 PM Version By: Lorine Bears RCP, RRT, CHT Entered By: Christin Fudge on 04/08/2015 16:28:16 [name] ([id number]) -------------------------------------------------------------------------------- HBO Safety Checklist Details Patient Name: [name] Date of Service: 04/08/2015 1:00 PM Medical Record Number: [id number] Patient Account Number: [id number] Date of Birth/Sex: [birth year] (50 y.o. Male) Treating RN: Primary Care Physician: [name] Other Clinician: [name] Referring Physician: [name] Treating Physician/Extender: Frann Rider in Treatment: 13 HBO Safety Checklist Items Safety Checklist Consent Form Signed Patient voided / foley secured and emptied When did you last eato 09:00 am Last dose of injectable or oral agent 07:00 am NA Ostomy pouch emptied and vented if applicable NA All implantable devices assessed, documented and approved Intravenous access site secured and place Valuables secured Linens and cotton and cotton/polyester blend (less than 51% polyester) Personal oil-based products / skin lotions / body lotions removed Wigs or hairpieces removed Smoking or tobacco materials removed Books / newspapers / magazines / loose paper removed Cologne, aftershave, perfume and deodorant removed Jewelry removed (may wrap wedding band) Make-up removed Hair care products removed Battery operated devices (external) removed Heating patches and chemical warmers removed NA Titanium  eyewear removed NA Nail polish cured greater than 10 hours NA Casting material cured greater than 10 hours NA Hearing aids removed NA Loose dentures or partials removed NA Prosthetics have been removed Patient demonstrates correct use of air break device (if applicable) Patient concerns have been addressed Patient grounding bracelet on and cord attached  to chamber Specifics for Inpatients (complete in addition to above) Medication sheet sent with patient Intravenous medications needed or due during therapy sent with patient [name], [name] ([id number]) Drainage tubes (e.g. nasogastric tube or chest tube secured and vented) Endotracheal or Tracheotomy tube secured Cuff deflated of air and inflated with saline Airway suctioned Electronic Signature(s) Signed: 04/08/2015 3:15:02 PM By: Lorine Bears RCP, RRT, CHT Entered By: Lorine Bears on 04/08/2015 13:09:14

## 2015-04-10 NOTE — Progress Notes (Signed)
ALAMEEN, MISENCIK ([id number]) Visit Report for 04/09/2015 HBO Details Patient Name: [name], [name]. Date of Service: 04/09/2015 1:30 PM Medical Record Number: [id number] Patient Account Number: [id number] Date of Birth/Sex: [birth year] (50 y.o. Male) Treating RN: Primary Care Physician: [name] Other Clinician: [name] Referring Physician: [name] Treating Physician/Extender: Frann Rider in Treatment: 13 HBO Treatment Course Details Treatment Course Ordering Physician: Christin Fudge 1 Number: HBO Treatment Start Date: 02/01/2015 Total Treatments 60 Ordered: HBO Indication: Diabetic Ulcer(s) of the Lower Extremity Standard/Conservative Wound Care tried and failed greater than or equal to 30 days Wound #2 Right, Lateral Metatarsal head fifth HBO Treatment Details Treatment Number: 39 Patient Type: Outpatient Chamber Type: Monoplace Chamber Serial #: [id number] Treatment Protocol: 2.0 ATA with 90 minutes oxygen, and no air breaks Treatment Details Compression Rate Down: 1.5 psi / minute De-Compression Rate Up: 1.5 psi / minute Air breaks and breathing Compress Tx Pressure periods Decompress Decompress Begins Reached (leave unused spaces Begins Ends blank) Chamber Pressure (ATA) 1 2 - - - - - - 2 1 Clock Time (24 hr) 13:11 13:21 - - - - - - 14:51 15:01 Treatment Length: 110 (minutes) Treatment Segments: 4 Capillary Blood Glucose Pre Capillary Blood Glucose (mg/dl): Post Capillary Blood Glucose (mg/dl): Vital Signs Capillary Blood Glucose Reference Range: 80 - 120 mg / dl HBO Diabetic Blood Glucose Intervention Range: <131 mg/dl or >249 mg/dl Time Vitals Blood Respiratory Capillary Blood Glucose Pulse Action Type: Pulse: Temperature: Taken: Pressure: Rate: Glucose (mg/dl): Meter #: Oximetry (%) Taken: Pre 13:03 132/64 72 18 97.5 159 1 none Post 15:06 146/68 84 18 98.2 111 1 none [name], [name] J. ([id number]) Treatment  Response Treatment Completion Status: Treatment Completed without Adverse Event HBO Attestation I certify that I supervised this HBO treatment in accordance with Medicare guidelines. A trained Yes emergency response team is readily available per hospital policies and procedures. Continue HBOT as ordered. Yes Electronic Signature(s) Signed: 04/09/2015 3:52:33 PM By: Christin Fudge MD, FACS Previous Signature: 04/09/2015 3:37:09 PM Version By: Lorine Bears RCP, RRT, CHT Entered By: Christin Fudge on 04/09/2015 15:52:33 [name] ([id number]) -------------------------------------------------------------------------------- HBO Safety Checklist Details Patient Name: [name] Date of Service: 04/09/2015 1:30 PM Medical Record Number: [id number] Patient Account Number: [id number] Date of Birth/Sex: 06/29/1965 (50 y.o. Male) Treating RN: Primary Care Physician: [name] Other Clinician: [name] Referring Physician: [name] Treating Physician/Extender: Frann Rider in Treatment: 13 HBO Safety Checklist Items Safety Checklist Consent Form Signed Patient voided / foley secured and emptied When did you last eato 10:30 am Last dose of injectable or oral agent 07:00 am NA Ostomy pouch emptied and vented if applicable NA All implantable devices assessed, documented and approved Intravenous access site secured and place Valuables secured Linens and cotton and cotton/polyester blend (less than 51% polyester) Personal oil-based products / skin lotions / body lotions removed Wigs or hairpieces removed Smoking or tobacco materials removed Books / newspapers / magazines / loose paper removed Cologne, aftershave, perfume and deodorant removed Jewelry removed (may wrap wedding band) Make-up removed Hair care products removed Battery operated devices (external) removed Heating patches and chemical warmers removed NA Titanium  eyewear removed NA Nail polish cured greater than 10 hours NA Casting material cured greater than 10 hours NA Hearing aids removed NA Loose dentures or partials removed NA Prosthetics have been removed Patient demonstrates correct use of air break device (if applicable) Patient concerns have been addressed Patient grounding bracelet on and cord attached  to chamber Specifics for Inpatients (complete in addition to above) Medication sheet sent with patient Intravenous medications needed or due during therapy sent with patient [name], [name] ([id number]) Drainage tubes (e.g. nasogastric tube or chest tube secured and vented) Endotracheal or Tracheotomy tube secured Cuff deflated of air and inflated with saline Airway suctioned Electronic Signature(s) Signed: 04/09/2015 3:37:09 PM By: Lorine Bears RCP, RRT, CHT Entered By: Lorine Bears on 04/09/2015 13:18:56

## 2015-04-10 NOTE — Progress Notes (Signed)
CURTIS, MARKHAM ([id number]) Visit Report for 04/09/2015 Arrival Information Details Patient Name: [name], [name]. Date of Service: 04/09/2015 1:30 PM Medical Record Number: [id number] Patient Account Number: [id number] Date of Birth/Sex: 06/22/1965 (50 y.o. Male) Treating RN: Primary Care Physician: [name] Other Clinician: [name] Referring Physician: [name] Treating Physician/Extender: Frann Rider in Treatment: 13 Visit Information History Since Last Visit Added or deleted any medications: No Patient Arrived: Ambulatory Any new allergies or adverse reactions: No Arrival Time: 13:00 Had a fall or experienced change in No Accompanied By: self activities of daily living that may affect Transfer Assistance: None risk of falls: Patient Identification Verified: Yes Signs or symptoms of abuse/neglect No Secondary Verification Process Yes since last visito Completed: Hospitalized since last visit: No Patient Requires Transmission-Based No Has Dressing in Place as Prescribed: Yes Precautions: Has Footwear/Offloading in Place as Yes Patient Has Alerts: Yes Prescribed: Patient Alerts: DM II Left: Surgical Shoe with Pressure Relief Insole Pain Present Now: No Electronic Signature(s) Signed: 04/09/2015 3:37:09 PM By: Lorine Bears RCP, RRT, CHT Entered By: Lorine Bears on 04/09/2015 13:17:33 [name] ([id number]) -------------------------------------------------------------------------------- Encounter Discharge Information Details Patient Name: [name] Date of Service: 04/09/2015 1:30 PM Medical Record Number: [id number] Patient Account Number: [id number] Date of Birth/Sex: [birth year] (50 y.o. Male) Treating RN: Primary Care Physician: [name] Other Clinician: [name] Referring Physician: [name] Treating Physician/Extender: Frann Rider in  Treatment: 30 Encounter Discharge Information Items Discharge Pain Level: 0 Discharge Condition: Stable Ambulatory Status: Ambulatory Discharge Destination: Home Private Transportation: Auto Accompanied By: self Schedule Follow-up Appointment: No Medication Reconciliation completed and No provided to Patient/Care Jiyaan Steinhauser: Clinical Summary of Care: Notes Patient has an HBO treatment scheduled on 04/12/15 at 13:00 pm. Electronic Signature(s) Signed: 04/09/2015 3:37:09 PM By: Lorine Bears RCP, RRT, CHT Entered By: Lorine Bears on 04/09/2015 15:21:32 [name] ([id number]) -------------------------------------------------------------------------------- Vitals Details Patient Name: [name] Date of Service: 04/09/2015 1:30 PM Medical Record Number: [id number] Patient Account Number: [id number] Date of Birth/Sex: 1965/08/17 (50 y.o. Male) Treating RN: Primary Care Physician: [name] Other Clinician: [name] Referring Physician: [name] Treating Physician/Extender: Frann Rider in Treatment: 13 Vital Signs Time Taken: 13:03 Temperature (F): 97.5 Height (in): 74 Pulse (bpm): 72 Weight (lbs): 288 Respiratory Rate (breaths/min): 18 Body Mass Index (BMI): 37 Blood Pressure (mmHg): 132/64 Capillary Blood Glucose (mg/dl): 159 Reference Range: 80 - 120 mg / dl Electronic Signature(s) Signed: 04/09/2015 3:37:09 PM By: Lorine Bears RCP, RRT, CHT Entered By: Lorine Bears on 04/09/2015 13:18:14

## 2015-04-10 NOTE — Progress Notes (Addendum)
[name], [name] ([id number]) Visit Report for 04/09/2015 Chief Complaint Document Details [name], [name] 04/09/2015 12:45 Patient Name: Date of Service: [name] Medical Record Patient Account Number: [id number] Number: Treating RN: Ahmed Prima Date of Birth/Sex: 19-Jun-1965 (50 y.o. Male) Other Clinician: Primary Care Physician: [name] Treating Christin Fudge Referring Physician: [name] Physician/Extender: Weeks in Treatment: 13 Information Obtained from: Patient Chief Complaint Patients presents for treatment of an open diabetic ulcer on the plantar aspect of the right foot which she's had for about 4 weeks Electronic Signature(s) Signed: 04/09/2015 12:57:37 PM By: Christin Fudge MD, FACS Entered By: Christin Fudge on 04/09/2015 12:57:37 [name] ([id number]) -------------------------------------------------------------------------------- HPI Details [name], [name] 04/09/2015 12:45 Patient Name: Date of Service: [name] Medical Record Patient Account Number: [id number] Number: Treating RN: Ahmed Prima Date of Birth/Sex: [birth year] (50 y.o. Male) Other Clinician: Primary Care Physician: [name] Treating Christin Fudge Referring Physician: [name] Physician/Extender: Weeks in Treatment: 13 History of Present Illness Location: plantar aspect of the right forefoot Quality: Patient reports No Pain. Severity: Patient states wound are getting worse. Duration: Patient has had the wound for < 4 weeks prior to presenting for treatment Timing: he has minimal discharge from the wound Context: The wound appeared gradually over time Modifying Factors: Other treatment(s) tried include:plan local care but not offloading Associated Signs and Symptoms: Patient reports having:no pain or discharge from the wound. HPI Description: This 50 year old male comes with an ulcerated area on the plantar aspect of the right  foot which she's had for approximately a month. I have known him from a previous visit at [facility] wound center and was treated in the months of April and May 2016 and rapidly healed a left plantar ulcer with a total contact cast. He has been a diabetic for about 16 years and tries to keep active and is fairly compliant with his diabetes management. 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 Past medical history significant for hypertension, hyperlipidemia, and status post appendectomy 1993. He does not smoke or drink alcohol. 01/14/2015 -- the patient had tolerated his total contact cast very well and had no problems and has had no systemic symptoms. However when his total contact cast was cut open he had excessive amount of purulent drainage in spite of being on antibiotics. He had had a recent x-ray done in the ER 12/22/2014 which showed IMPRESSION:No evidence of osseous erosion. Known soft tissue ulceration is not well characterized on radiograph. Scattered vascular calcifications seen. his last hemoglobin A1c in December was 7.3. He has been on Augmentin and doxycycline for the last 2 weeks. 01/21/2015 -- his culture grew rare growth of Pantoea species an MR moderate growth of Candida parapsilosis. it is sensitive to levofloxacin. He has not heard back from the insurance company regarding his hyperbaric oxygen therapy. His MRI has not been done yet and we will try and get him an earlier date 01/28/2015 -- MRI was done last night -- IMPRESSION:1. Soft tissue ulcer overlying the plantar aspect of the fifth metatarsal head extending to the cortex. Subcortical marrow edema in the fifth metatarsal head with corresponding T1 hypointensity is concerning for early osteomyelitis of the plantar lateral aspect of the fifth metatarsal head. Chest x-ray done on 01/14/2015 shows bronchiectatic changes without infiltrate. EKG done on generally 17 2017 shows a normal sinus rhythm and is a normal  EKG. [name], [name] ([id number]) 02/04/2015 -- he was asked to see Dr. [name] last week and had 2 appointments but had  to cancel both due to pressures of work. Last night he has woken up with severe pain in the foot and leg and it is swollen up. No fever or no change in his blood glucose. Addendum: I spoke with Dr. [name] who kindly agreed to accept the patient for inpatient therapy and have also opened to the hospitalist Dr. [name], and discuss details of the management including PICC line and repeat cultures. 02/12/2015-- -- was seen by Dr. [name] in the hospital and a PICC line was placed. He was to receive Ceftazidime 2 g every 12 hourly, oral levofloxacin 750 mg every 24 hourly and oral fluconazole 200 mg daily. The antibiotics were to be given for 4 weeks except the Diflucan was to be given for the first 2 weeks. Reviewed note from 02/10/2015 -- and Dr. [name] had recommended management for growth of MSSA and Serratia. He switched him from ceftazidime to ceftriaxone 2 g every 24 hours. Levofloxacin was stopped and he would continue on fluconazole for another week. He had asked me to decide whether further imaging was necessary and whether surgical debridement of the infected bone was needed. He is doing well and has been off work for this week and we will keep him off the next week. 02/22/2015 -- he was seen by my colleague on 01/19/2015 and at that time an incision and drainage was done on his right lateral forefoot on the dorsum. Today when I probed this wound it is frankly draining pus and it communicates with the ulcer on the plantar aspect of his right foot. The patient is still on IV ceftriaxone 2 g every 24 hours and is to be seen by Dr. [name] on Friday. 03/19/2015 -- On 03/04/2015 I spoke to Dr. Celesta Gentile who saw him in the office today and did an x-ray of his right foot and noted that there was osteomyelitis of the right fifth toe and metatarsal and a  lot of pus draining from the wound. He recommended operative debridement which would probably result in the fifth metatarsal head and toe amputation.The patient would be referred back to [place] once he was done with surgery. He was admitted to [facility] yesterday and had surgery done by podiatry for a right fifth metatarsal acute osteomyelitis with cellulitis and abscess. He had a right foot incision and drainage with fifth metatarsal partial amputation and removal of toe infected bone and soft tissue with cultures. The wound was partially closed and packing of the distal end was done. Patient was already on cefepime 2 g IV every 8 hourly and put on vancomycin pending final cultures. He had grown moderate gram-negative rods, and later found to be rare diphtheroids. I received a call from Dr. Cannon Kettle the podiatrist and we discussed the above. On 03/10/2015 he was found positive for influenza a and has been put on Tamiflu. Since his discharge he has been seen by Dr. Cannon Kettle who is planning to remove his sutures this coming week. He was reviewed by Dr. [name] on 03/17/2015 and his Diflucan was stopped and vancomycin. After the 3 doses he is taking. He is going to change Ceftazidime to Zosyn 3.375 g IV every 8 hours. 03/25/2015 -- he was seen by the podiatrist a couple of days ago and the sutures were removed. She will follow back with him in 4 weeks' time and at that time x-rays will be taken and a custom molded insert would be made for his shoe. 04/01/2015 -- he was seen by Dr.  Fitzgerald on 03/29/2015 who pulled the PICC line stopped his IV antibiotics and recommended starting doxycycline and levofloxacin for 2 weeks. He also stop the fluconazole. The patient will follow up with him only when necessary. Electronic Signature(s) Signed: 04/09/2015 12:57:41 PM By: Christin Fudge MD, FACS Entered By: Christin Fudge on 04/09/2015 12:57:41 ZACK, HUNSAKER ([id number], TUMULTY ([id number]) -------------------------------------------------------------------------------- Physical Exam Details CAMAURI, HONTS 04/09/2015 12:45 Patient Name: Date of Service: [name] Medical Record Patient Account Number: [id number] Number: Treating RN: Ahmed Prima Date of Birth/Sex: 09/03/1965 (50 y.o. Male) Other Clinician: Primary Care Physician: [name] Treating Christin Fudge Referring Physician: [name] Physician/Extender: Weeks in Treatment: 13 Constitutional . Pulse regular. Respirations normal and unlabored. Afebrile. . Eyes Nonicteric. Reactive to light. Ears, Nose, Mouth, and Throat Lips, teeth, and gums WNL.Marland Kitchen Moist mucosa without lesions. Neck supple and nontender. No palpable supraclavicular or cervical adenopathy. Normal sized without goiter. Respiratory WNL. No retractions.. Breath sounds WNL, No rubs, rales, rhonchi, or wheeze.. Cardiovascular Heart rhythm and rate regular, no murmur or gallop.. Pedal Pulses WNL. No clubbing, cyanosis or edema. Chest Breasts symmetical and no nipple discharge.. Breast tissue WNL, no masses, lumps, or tenderness.. Lymphatic No adneopathy. No adenopathy. No adenopathy. Musculoskeletal Adexa without tenderness or enlargement.. Digits and nails w/o clubbing, cyanosis, infection, petechiae, ischemia, or inflammatory conditions.. Integumentary (Hair, Skin) No suspicious lesions. No crepitus or fluctuance. No peri-wound warmth or erythema. No masses.Marland Kitchen Psychiatric Judgement and insight Intact.. No evidence of depression, anxiety, or agitation.. Notes the hyper granulation tissue has gone down significantly and his wound is looking very clean. No debridement was required today. Electronic Signature(s) Signed: 04/09/2015 12:58:08 PM By: Christin Fudge MD, FACS Entered By: Christin Fudge on 04/09/2015 12:58:08 [name]  ([id number]) -------------------------------------------------------------------------------- Physician Orders Details TREYE, LUDINGTON 04/09/2015 12:45 Patient Name: Date of Service: [name] Medical Record Patient Account Number: [id number] Number: Treating RN: Ahmed Prima Date of Birth/Sex: 27-Oct-1965 (50 y.o. Male) Other Clinician: Primary Care Physician: [name] Treating Shawna Wearing Referring Physician: [name] Physician/Extender: Suella [place] in Treatment: 13 Verbal / Phone Orders: Yes Clinician: Carolyne Fiscal, Debi Read Back and Verified: Yes Diagnosis Coding ICD-10 Coding Code Description E11.621 Type 2 diabetes mellitus with foot ulcer L97.512 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d L84 Corns and callosities L02.611 Cutaneous abscess of right foot M86.371 Chronic multifocal osteomyelitis, right ankle and foot Wound Cleansing Wound #2 Right,Lateral Metatarsal head fifth o Clean wound with Normal Saline. Anesthetic Wound #2 Right,Lateral Metatarsal head fifth o Topical Lidocaine 4% cream applied to wound bed prior to debridement Skin Barriers/Peri-Wound Care Wound #2 Right,Lateral Metatarsal head fifth o Skin Prep Primary Wound Dressing Wound #2 Right,Lateral Metatarsal head fifth o Hydrafera Blue Secondary Dressing Wound #2 Right,Lateral Metatarsal head fifth o Boardered Foam Dressing Dressing Change Frequency Wound #2 Right,Lateral Metatarsal head fifth o Change dressing every other day. CHEVEZ, RIVEST ([id number]) Follow-up Appointments Wound #2 Right,Lateral Metatarsal head fifth o Return Appointment in 1 week. Off-Loading Wound #2 Right,Lateral Metatarsal head fifth o Open toe surgical shoe with peg assist. - front offload Medications-please add to medication list. Wound #2 Right,Lateral Metatarsal head fifth o Other: - continue oral antibiotics Electronic  Signature(s) Signed: 04/09/2015 4:30:51 PM By: Christin Fudge MD, FACS Signed: 04/13/2015 4:14:54 PM By: Alric Quan Entered By: Alric Quan on 04/09/2015 13:07:54 [name] ([id number]) -------------------------------------------------------------------------------- Problem List Details ADAHIR, YAROSH 04/09/2015 12:45 Patient Name: Date of Service: [name] Medical Record Patient  Account Number: [id number] Number: Treating RN: Ahmed Prima Date of Birth/Sex: 03/23/1965 (50 y.o. 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 of other part of right foot with 01/07/2015 Yes fat layer exposed L84 Corns and callosities 01/07/2015 Yes L02.611 Cutaneous abscess of right foot 01/14/2015 Yes M86.371 Chronic multifocal osteomyelitis, right ankle and foot 01/28/2015 Yes Inactive Problems Resolved Problems Electronic Signature(s) Signed: 04/09/2015 12:57:31 PM By: Christin Fudge MD, FACS Entered By: Christin Fudge on 04/09/2015 12:57:31 [name] -------------------------------------------------------------------------------- Progress Note Details Westerfeld, Jye 04/09/2015 12:45 Patient Name: Date of Service: [name] Medical Record Patient Account Number: [id number] Number: Treating RN: Ahmed Prima Date of Birth/Sex: 1965-12-03 (50 y.o. Male) Other Clinician: Primary Care Physician: [name] Treating Sherol Sabas Referring Physician: [name] Physician/Extender: Weeks in Treatment: 13 Subjective Chief Complaint Information obtained from Patient Patients presents for treatment of an open diabetic ulcer on the plantar  aspect of the right foot which she's had for about 4 weeks History of Present Illness (HPI) The following HPI elements were documented for the patient's wound: Location: plantar aspect of the right forefoot Quality: Patient reports No Pain. Severity: Patient states wound are getting worse. Duration: Patient has had the wound for < 4 weeks prior to presenting for treatment Timing: he has minimal discharge from the wound Context: The wound appeared gradually over time Modifying Factors: Other treatment(s) tried include:plan local care but not offloading Associated Signs and Symptoms: Patient reports having:no pain or discharge from the wound. This 50 year old male comes with an ulcerated area on the plantar aspect of the right foot which she's had for approximately a month. I have known him from a previous visit at [facility] wound center and was treated in the months of April and May 2016 and rapidly healed a left plantar ulcer with a total contact cast. He has been a diabetic for about 16 years and tries to keep active and is fairly compliant with his diabetes management. 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 Past medical history significant for hypertension, hyperlipidemia, and status post appendectomy 1993. He does not smoke or drink alcohol. 01/14/2015 -- the patient had tolerated his total contact cast very well and had no problems and has had no systemic symptoms. However when his total contact cast was cut open he had excessive amount of purulent drainage in spite of being on antibiotics. He had had a recent x-ray done in the ER 12/22/2014 which showed IMPRESSION:No evidence of osseous erosion. Known soft tissue ulceration is not well characterized on radiograph. Scattered vascular calcifications seen. his last hemoglobin A1c in December was 7.3. He has been on Augmentin and doxycycline for the last 2 weeks. 01/21/2015 -- his culture grew rare growth of Pantoea species an MR  moderate growth of Candida parapsilosis. it is sensitive to levofloxacin. He has not heard back from the insurance company regarding his hyperbaric oxygen therapy. SALATIEL, KASPERSKI ([id number]) His MRI has not been done yet and we will try and get him an earlier date 01/28/2015 -- MRI was done last night -- IMPRESSION:1. Soft tissue ulcer overlying the plantar aspect of the fifth metatarsal head extending to the cortex. Subcortical marrow edema in the fifth metatarsal head with corresponding T1 hypointensity is concerning for early osteomyelitis of the plantar lateral aspect  of the fifth metatarsal head. Chest x-ray done on 01/14/2015 shows bronchiectatic changes without infiltrate. EKG done on generally 17 2017 shows a normal sinus rhythm and is a normal EKG. 02/04/2015 -- he was asked to see Dr. [name] last week and had 2 appointments but had to cancel both due to pressures of work. Last night he has woken up with severe pain in the foot and leg and it is swollen up. No fever or no change in his blood glucose. Addendum: I spoke with Dr. [name] who kindly agreed to accept the patient for inpatient therapy and have also opened to the hospitalist Dr. [name], and discuss details of the management including PICC line and repeat cultures. 02/12/2015-- -- was seen by Dr. [name] in the hospital and a PICC line was placed. He was to receive Ceftazidime 2 g every 12 hourly, oral levofloxacin 750 mg every 24 hourly and oral fluconazole 200 mg daily. The antibiotics were to be given for 4 weeks except the Diflucan was to be given for the first 2 weeks. Reviewed note from 02/10/2015 -- and Dr. [name] had recommended management for growth of MSSA and Serratia. He switched him from ceftazidime to ceftriaxone 2 g every 24 hours. Levofloxacin was stopped and he would continue on fluconazole for another week. He had asked me to decide whether further imaging was necessary and whether  surgical debridement of the infected bone was needed. He is doing well and has been off work for this week and we will keep him off the next week. 02/22/2015 -- he was seen by my colleague on 01/19/2015 and at that time an incision and drainage was done on his right lateral forefoot on the dorsum. Today when I probed this wound it is frankly draining pus and it communicates with the ulcer on the plantar aspect of his right foot. The patient is still on IV ceftriaxone 2 g every 24 hours and is to be seen by Dr. [name] on Friday. 03/19/2015 -- On 03/04/2015 I spoke to Dr. 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 He recommended operative debridement which would probably result in the fifth metatarsal head and toe amputation.The patient would be referred back to [place] once he was done with surgery. He was admitted to [facility] yesterday and had surgery done by podiatry for a right fifth metatarsal acute osteomyelitis with cellulitis and abscess. He had a right foot incision and drainage with fifth metatarsal partial amputation and removal of toe infected bone and soft tissue with cultures. The wound was partially closed and packing of the distal end was done. Patient was already on cefepime 2 g IV every 8 hourly and put on vancomycin pending final cultures. He had grown moderate gram-negative rods, and later found to be rare diphtheroids. I received a call from Dr. Cannon Kettle the podiatrist and we discussed the above. On 03/10/2015 he was found positive for influenza a and has been put on Tamiflu. Since his discharge he has been seen by Dr. Cannon Kettle who is planning to remove his sutures this coming week. He was reviewed by Dr. [name] on 03/17/2015 and his Diflucan was stopped and vancomycin. After the 3 doses he is taking. He is  going to change Ceftazidime to Zosyn 3.375 g IV every 8 hours. 03/25/2015 -- he was seen  by the podiatrist a couple of days ago and the sutures were removed. She will follow back with him in 4 weeks' time and at that time x-rays will be taken and a custom molded insert would be made for his shoe. LIZBETH, WODRICH ([id number]) 04/01/2015 -- he was seen by Dr. [name] on 03/29/2015 who pulled the PICC line stopped his IV antibiotics and recommended starting doxycycline and levofloxacin for 2 weeks. He also stop the fluconazole. The patient will follow up with him only when necessary. Objective Constitutional Pulse regular. Respirations normal and unlabored. Afebrile. Vitals Time Taken: 12:46 PM, Height: 74 in, Weight: 288 lbs, BMI: 37, Temperature: 98.1 F, Pulse: 99 bpm, Respiratory Rate: 20 breaths/min, Blood Pressure: 156/76 mmHg. Eyes Nonicteric. Reactive to light. Ears, Nose, Mouth, and Throat Lips, teeth, and gums WNL.Marland Kitchen Moist mucosa without lesions. Neck supple and nontender. No palpable supraclavicular or cervical adenopathy. Normal sized without goiter. Respiratory WNL. No retractions.. Breath sounds WNL, No rubs, rales, rhonchi, or wheeze.. Cardiovascular Heart rhythm and rate regular, no murmur or gallop.. Pedal Pulses WNL. No clubbing, cyanosis or edema. Chest Breasts symmetical and no nipple discharge.. Breast tissue WNL, no masses, lumps, or tenderness.. Lymphatic No adneopathy. No adenopathy. No adenopathy. Musculoskeletal Adexa without tenderness or enlargement.. Digits and nails w/o clubbing, cyanosis, infection, petechiae, ischemia, or inflammatory conditions.Marland Kitchen Psychiatric Judgement and insight Intact.. No evidence of depression, anxiety, or agitation.. General Notes: the hyper granulation tissue has gone down significantly and his wound is looking very clean. No debridement was required today. [name], GRAESSLE ([id number]) Integumentary (Hair, Skin) No  suspicious lesions. No crepitus or fluctuance. No peri-wound warmth or erythema. No masses.. Wound #2 status is Open. Original cause of wound was Bump. The wound is located on the Right,Lateral Metatarsal head fifth. The wound measures 1.5cm length x 0.5cm width x 0.1cm depth; 0.589cm^2 area and 0.059cm^3 volume. The wound is limited to skin breakdown. There is no tunneling or undermining noted. There is a large amount of serosanguineous drainage noted. The wound margin is flat and intact. There is large (67-100%) red granulation within the wound bed. There is a small (1-33%) amount of necrotic tissue within the wound bed including Adherent Slough. The periwound skin appearance exhibited: Moist, Erythema. The periwound skin appearance did not exhibit: Callus, Crepitus, Excoriation, Fluctuance, Friable, Induration, Localized Edema, Rash, Scarring, Dry/Scaly, Maceration, Atrophie Blanche, Cyanosis, Ecchymosis, Hemosiderin Staining, Mottled, Pallor, Rubor. The surrounding wound skin color is noted with erythema which is circumferential. Periwound temperature was noted as Hot. The periwound has tenderness on palpation. 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 Anesthetic: Wound #2 Right,Lateral Metatarsal head fifth: Topical Lidocaine 4% cream applied to wound bed prior to debridement Skin Barriers/Peri-Wound Care: Wound #2 Right,Lateral Metatarsal head fifth: Skin Prep Primary Wound Dressing: Wound #2 Right,Lateral Metatarsal head fifth: Hydrafera Blue Secondary Dressing: Wound #2 Right,Lateral Metatarsal head  fifth: CAIGE, TIMMERMANN ([id number]) Boardered Foam Dressing Dressing Change Frequency: Wound #2 Right,Lateral Metatarsal head fifth: Change dressing every other day. Follow-up Appointments: Wound #2 Right,Lateral Metatarsal head fifth: Return Appointment in 1 week. Off-Loading: Wound #2 Right,Lateral Metatarsal head fifth: Open toe surgical shoe with peg assist. - front offload  Medications-please add to medication list.: Wound #2 Right,Lateral Metatarsal head fifth: Other: - continue oral antibiotics I have recommended Hydrofera Blue over this area with a bordered foam to be changed every other day. He will continue with his antibiotics as per Dr. [name] and will benefit from hyperbaric oxygen therapy for his Earleen Newport stage III diabetic foot ulcer. Electronic Signature(s) Signed: 04/09/2015 4:32:03 PM By: Christin Fudge MD, FACS Previous Signature: 04/09/2015 12:58:36 PM Version By: Christin Fudge MD, FACS Entered By: Christin Fudge on 04/09/2015 16:32:03 [name] -------------------------------------------------------------------------------- SuperBill Details Patient Name: [name] Date of Service: 04/09/2015 Medical Record Number: [id number] Patient Account Number: [id number] Date of Birth/Sex: 12-Dec-1965 (50 y.o. 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, right ankle and foot Facility Procedures CPT4 Code: [id number] Description: [id number] - WOUND CARE VISIT-LEV 2 EST PT Modifier: Quantity: 1 Physician  Procedures CPT4 Code Description: [id number] - WC PHYS LEVEL 3 - EST PT ICD-10 Description Diagnosis E11.621 Type 2 diabetes mellitus with foot ulcer L97.512 Non-pressure chronic ulcer of other part of right fo Modifier: ot with fat lay Quantity: 1 er exposed Electronic Signature(s) Signed: 04/09/2015 4:30:51 PM By: Christin Fudge MD, FACS Signed: 04/13/2015 4:14:54 PM By: Alric Quan Previous Signature: 04/09/2015 12:58:54 PM Version By: Christin Fudge MD, FACS Entered By: Alric Quan on 04/09/2015 13:08:34

## 2015-04-12 ENCOUNTER — Encounter: Payer: Managed Care, Other (non HMO) | Admitting: Surgery

## 2015-04-12 DIAGNOSIS — E11621 Type 2 diabetes mellitus with foot ulcer: Secondary | ICD-10-CM | POA: Diagnosis not present

## 2015-04-12 LAB — GLUCOSE, CAPILLARY
Glucose-Capillary: 212 mg/dL — ABNORMAL HIGH (ref 65–99)
Glucose-Capillary: 187 mg/dL — ABNORMAL HIGH (ref 65–99)

## 2015-04-12 NOTE — Progress Notes (Signed)
ABDULRAHIM, MANOS ([id number]) Visit Report for 04/12/2015 Arrival Information Details TIMOUTHY, RITTGERS 04/12/2015 1:00 Patient Name: Date of Service: [name] Medical Record Patient Account Number: [id number] Number: Treating RN: Date of Birth/Sex: 1965/06/11 (50 y.o. Male) Other Clinician: [name] Primary Care Physician: [name] Treating Britto, Errol Referring Physician: [name] Physician/Extender: Suella Grove in Treatment: 13 Visit Information History Since Last Visit Added or deleted any medications: No Patient Arrived: Ambulatory Any new allergies or adverse reactions: No Arrival Time: 12:50 Had a fall or experienced change in No Accompanied By: self activities of daily living that may affect Transfer Assistance: None risk of falls: Patient Identification Verified: Yes Signs or symptoms of abuse/neglect No Secondary Verification Process Yes since last visito Completed: Hospitalized since last visit: No Patient Requires Transmission-Based No Has Dressing in Place as Prescribed: Yes Precautions: Has Footwear/Offloading in Place as Yes Patient Has Alerts: Yes Prescribed: Patient Alerts: DM II Left: Surgical Shoe with Pressure Relief Insole Pain Present Now: No Electronic Signature(s) Signed: 04/12/2015 4:15:26 PM By: Lorine Bears RCP, RRT, CHT Entered By: Lorine Bears on 04/12/2015 13:33:31 [name] ([id number]) -------------------------------------------------------------------------------- Encounter Discharge Information Details CLAYBORNE, SALVIA 04/12/2015 1:00 Patient Name: Date of Service: [name] Medical Record Patient Account Number: [id number] Number: Treating RN: Date of Birth/Sex: 1965/01/12 (50 y.o. Male) Other Clinician: [name] Primary Care Physician: [name] Treating Christin Fudge Referring Physician: [name] Physician/Extender: Weeks  in Treatment: 13 Encounter Discharge Information Items Discharge Pain Level: 0 Discharge Condition: Stable Ambulatory Status: Ambulatory Discharge Destination: Home Private Transportation: Auto Accompanied By: self Schedule Follow-up Appointment: No Medication Reconciliation completed and No provided to Patient/Care Lynsey Ange: Clinical Summary of Care: Notes Patient has an HBO treatment scheduled on 04/13/15 at 13:00 pm. Electronic Signature(s) Signed: 04/12/2015 4:15:26 PM By: Lorine Bears RCP, RRT, CHT Entered By: Lorine Bears on 04/12/2015 15:35:42 [name] ([id number]) -------------------------------------------------------------------------------- Vitals Details Polansky, Tirth 04/12/2015 1:00 Patient Name: Date of Service: [name] Medical Record Patient Account Number: [id number] Number: Treating RN: Date of Birth/Sex: April 16, 1965 (50 y.o. Male) Other Clinician: [name] Primary Care Physician: [name] Treating Britto, Errol Referring Physician: [name] Physician/Extender: Weeks in Treatment: 13 Vital Signs Time Taken: 12:56 Temperature (F): 98.8 Height (in): 74 Pulse (bpm): 90 Weight (lbs): 288 Respiratory Rate (breaths/min): 18 Body Mass Index (BMI): 37 Blood Pressure (mmHg): 138/78 Capillary Blood Glucose (mg/dl): 212 Reference Range: 80 - 120 mg / dl Electronic Signature(s) Signed: 04/12/2015 4:15:26 PM By: Lorine Bears RCP, RRT, CHT Entered By: Lorine Bears on 04/12/2015 13:34:02

## 2015-04-12 NOTE — Progress Notes (Signed)
[name], [name] ([id number]) Visit Report for 04/12/2015 HBO Details [name], [name] 04/12/2015 1:00 Patient Name: Date of Service: [name] Medical Record Patient Account Number: [id number] Number: Treating RN: Date of Birth/Sex: September 28, 1965 (50 y.o. Male) Other Clinician: [name] Primary Care Physician: [name] Treating Britto, Errol Referring Physician: [name] Physician/Extender: Suella Grove in Treatment: 13 HBO Treatment Course Details Treatment Course Ordering Physician: Christin Fudge 1 Number: HBO Treatment Start Date: 02/01/2015 Total Treatments 60 Ordered: HBO Indication: Diabetic Ulcer(s) of the Lower Extremity Standard/Conservative Wound Care tried and failed greater than or equal to 30 days Wound #2 Right, Lateral Metatarsal head fifth HBO Treatment Details Treatment Number: 40 Patient Type: Outpatient Chamber Type: Monoplace Chamber Serial #: [id number] Treatment Protocol: 2.0 ATA with 90 minutes oxygen, and no air breaks Treatment Details Compression Rate Down: 1.5 psi / minute De-Compression Rate Up: 1.5 psi / minute Air breaks and breathing Compress Tx Pressure periods Decompress Decompress Begins Reached (leave unused spaces Begins Ends blank) Chamber Pressure (ATA) 1 2 - - - - - - 2 1 Clock Time (24 hr) 13:04 13:14 - - - - - - 14:44 14:55 Treatment Length: 111 (minutes) Treatment Segments: 4 Capillary Blood Glucose Pre Capillary Blood Glucose (mg/dl): Post Capillary Blood Glucose (mg/dl): Vital Signs Capillary Blood Glucose Reference Range: 80 - 120 mg / dl HBO Diabetic Blood Glucose Intervention Range: <131 mg/dl or >249 mg/dl Time Vitals Blood Respiratory Capillary Blood Glucose Pulse Action Type: Pulse: Temperature: Taken: Pressure: Rate: Glucose (mg/dl): Meter #: Oximetry (%) Taken: Pre 12:56 138/78 90 18 98.8 212 1 none [name], [name] J. ([id number]) Post 15:01 138/70 84 18 98.4 187 1 none Treatment  Response Treatment Completion Status: Treatment Completed without Adverse Event Electronic Signature(s) Signed: 04/12/2015 4:06:56 PM By: Christin Fudge MD, FACS Signed: 04/12/2015 4:15:26 PM By: Lorine Bears RCP, RRT, CHT Previous Signature: 04/12/2015 3:21:15 PM Version By: Christin Fudge MD, FACS Entered By: Lorine Bears on 04/12/2015 15:34:42 [name], [name] ([id number]) -------------------------------------------------------------------------------- HBO Safety Checklist Details [name], [name] 04/12/2015 1:00 Patient Name: Date of Service: [name] Medical Record Patient Account Number: [id number] Number: Treating RN: Date of Birth/Sex: 09/02/1965 (50 y.o. Male) Other Clinician: [name] Primary Care Physician: [name] Treating Britto, Errol Referring Physician: [name] Physician/Extender: Suella Grove in Treatment: 13 HBO Safety Checklist Items Safety Checklist Consent Form Signed Patient voided / foley secured and emptied When did you last eato 11:00 am Last dose of injectable or oral agent 07:00 am NA Ostomy pouch emptied and vented if applicable NA All implantable devices assessed, documented and approved Intravenous access site secured and place Valuables secured Linens and cotton and cotton/polyester blend (less than 51% polyester) Personal oil-based products / skin lotions / body lotions removed Wigs or hairpieces removed Smoking or tobacco materials removed Books / newspapers / magazines / loose paper removed Cologne, aftershave, perfume and deodorant removed Jewelry removed (may wrap wedding band) Make-up removed Hair care products removed Battery operated devices (external) removed Heating patches and chemical warmers removed NA Titanium eyewear removed NA Nail polish cured greater than 10 hours NA Casting material cured greater than 10 hours NA Hearing aids removed NA Loose dentures or partials  removed NA Prosthetics have been removed Patient demonstrates correct use of air break device (if applicable) Patient concerns have been addressed Patient grounding bracelet on and cord attached to chamber Specifics for Inpatients (complete in addition to above) Medication sheet sent with patient [name], [name] ([id number]) Intravenous medications needed or due  during therapy sent with patient Drainage tubes (e.g. nasogastric tube or chest tube secured and vented) Endotracheal or Tracheotomy tube secured Cuff deflated of air and inflated with saline Airway suctioned Electronic Signature(s) Signed: 04/12/2015 4:15:26 PM By: Lorine Bears RCP, RRT, CHT Entered By: Lorine Bears on 04/12/2015 13:34:59

## 2015-04-13 ENCOUNTER — Encounter: Payer: Managed Care, Other (non HMO) | Admitting: Internal Medicine

## 2015-04-13 DIAGNOSIS — E11621 Type 2 diabetes mellitus with foot ulcer: Secondary | ICD-10-CM | POA: Diagnosis not present

## 2015-04-13 LAB — GLUCOSE, CAPILLARY
Glucose-Capillary: 152 mg/dL — ABNORMAL HIGH (ref 65–99)
Glucose-Capillary: 107 mg/dL — ABNORMAL HIGH (ref 65–99)

## 2015-04-13 NOTE — Progress Notes (Signed)
[name] ([id number]) Visit Report for 04/13/2015 HBO Details [name] Date of Service: 04/13/2015 1:00 PM Patient Name: J. Patient Account Number: [id number] Medical Record Treating RN: [id number] Number: Other Clinician: [name] Date of Birth/Sex: 07-16-1965 (50 y.o. Male) Treating [name], [name] Primary Care Physician/Extender: Floyde Parkins Physician: Referring Physician: Sharilyn Sites in Treatment: 13 HBO Treatment Course Details Treatment Course Ordering Physician: Christin Fudge 1 Number: HBO Treatment Start Date: 02/01/2015 Total Treatments 60 Ordered: HBO Indication: Diabetic Ulcer(s) of the Lower Extremity Standard/Conservative Wound Care tried and failed greater than or equal to 30 days Wound #2 Right, Lateral Metatarsal head fifth HBO Treatment Details Treatment Number: 41 Patient Type: Outpatient Chamber Type: Monoplace Chamber Serial #: [id number] Treatment Protocol: 2.0 ATA with 90 minutes oxygen, and no air breaks Treatment Details Compression Rate Down: 1.5 psi / minute De-Compression Rate Up: 1.5 psi / minute Air breaks and breathing Compress Tx Pressure periods Decompress Decompress Begins Reached (leave unused spaces Begins Ends blank) Chamber Pressure (ATA) 1 2 - - - - - - 2 1 Clock Time (24 hr) 13:05 13:15 - - - - - - 14:45 14:56 Treatment Length: 111 (minutes) Treatment Segments: 4 Capillary Blood Glucose Pre Capillary Blood Glucose (mg/dl): Post Capillary Blood Glucose (mg/dl): Vital Signs Capillary Blood Glucose Reference Range: 80 - 120 mg / dl HBO Diabetic Blood Glucose Intervention Range: <131 mg/dl or >249 mg/dl Time Vitals Blood Respiratory Capillary Blood Glucose Pulse Action Type: Pulse: Temperature: Taken: Pressure: Rate: Glucose (mg/dl): Meter #: Oximetry (%) Taken: [name], BRAZZEL ([id number]) Pre 12:54 132/78 84 18 98.5 152 1 none Post 15:00 136/70 90 18 98.5 107 1 none Treatment  Response Treatment Completion Status: Treatment Completed without Adverse Event Electronic Signature(s) Signed: 04/13/2015 3:36:31 PM By: Linton Ham MD Signed: 04/13/2015 4:12:29 PM By: Becky Sax, Sallie RCP, RRT, CHT Entered By: Becky Sax, Amado Nash on 04/13/2015 15:16:36 [name] ([id number]) -------------------------------------------------------------------------------- HBO Safety Checklist Details [name] Date of Service: 04/13/2015 1:00 PM Patient Name: J. Patient Account Number: [id number] Medical Record Treating RN: [id number] Number: Other Clinician: [name] Date of Birth/Sex: 09/07/1965 (50 y.o. Male) Treating [name], [name] Primary Care Physician/Extender: Floyde Parkins Physician: Referring Physician: Sharilyn Sites in Treatment: 13 HBO Safety Checklist Items Safety Checklist Consent Form Signed Patient voided / foley secured and emptied When did you last eato 11:00 am Last dose of injectable or oral agent 07:00 am NA Ostomy pouch emptied and vented if applicable NA All implantable devices assessed, documented and approved Intravenous access site secured and place Valuables secured Linens and cotton and cotton/polyester blend (less than 51% polyester) Personal oil-based products / skin lotions / body lotions removed Wigs or hairpieces removed Smoking or tobacco materials removed Books / newspapers / magazines / loose paper removed Cologne, aftershave, perfume and deodorant removed Jewelry removed (may wrap wedding band) Make-up removed Hair care products removed Battery operated devices (external) removed Heating patches and chemical warmers removed NA Titanium eyewear removed NA Nail polish cured greater than 10 hours NA Casting material cured greater than 10 hours NA Hearing aids removed NA Loose dentures or partials removed NA Prosthetics have been removed Patient demonstrates correct use of  air break device (if applicable) Patient concerns have been addressed Patient grounding bracelet on and cord attached to chamber Specifics for Inpatients (complete in addition to above) Medication sheet sent with patient ZAVIOR, SPERLE ([id number]) Intravenous medications needed or due during therapy sent with patient Drainage tubes (e.g. nasogastric tube  or chest tube secured and vented) Endotracheal or Tracheotomy tube secured Cuff deflated of air and inflated with saline Airway suctioned Electronic Signature(s) Signed: 04/13/2015 4:12:29 PM By: Lorine Bears RCP, RRT, CHT Entered By: Becky Sax, Amado Nash on 04/13/2015 13:16:25

## 2015-04-13 NOTE — Progress Notes (Signed)
NEO, TARWATER ([id number]) Visit Report for 04/13/2015 Arrival Information Details [name] Date of Service: 04/13/2015 1:00 PM Patient Name: J. Patient Account Number: [id number] Medical Record Treating RN: [id number] Number: Other Clinician: [name] Date of Birth/Sex: 1965-04-23 (50 y.o. Male) Treating [name], [name] Primary Care Physician/Extender: Floyde Parkins Physician: Referring Physician: Sharilyn Sites in Treatment: 13 Visit Information History Since Last Visit Added or deleted any medications: No Patient Arrived: Ambulatory Any new allergies or adverse reactions: No Arrival Time: 12:50 Had a fall or experienced change in No Accompanied By: self activities of daily living that may affect Transfer Assistance: None risk of falls: Patient Identification Verified: Yes Signs or symptoms of abuse/neglect No Secondary Verification Process Yes since last visito Completed: Hospitalized since last visit: No Patient Requires Transmission-Based No Has Dressing in Place as Prescribed: Yes Precautions: Has Footwear/Offloading in Place as Yes Patient Has Alerts: Yes Prescribed: Patient Alerts: DM II Left: Surgical Shoe with Pressure Relief Insole Pain Present Now: No Electronic Signature(s) Signed: 04/13/2015 4:12:29 PM By: Lorine Bears RCP, RRT, CHT Entered By: Becky Sax, Amado Nash on 04/13/2015 13:00:43 [name] ([id number]) -------------------------------------------------------------------------------- Encounter Discharge Information Details [name] Date of Service: 04/13/2015 1:00 PM Patient Name: J. Patient Account Number: [id number] Medical Record Treating RN: [id number] Number: Other Clinician: [name] Date of Birth/Sex: 02-Oct-1965 (50 y.o. Male) Treating [name], [name] Primary Care Physician/Extender: Floyde Parkins Physician: Referring Physician: Sharilyn Sites in Treatment: 13 Encounter Discharge Information Items Discharge Pain Level: 0 Discharge Condition: Stable Ambulatory Status: Cane Discharge Destination: Home Private Transportation: Auto Accompanied By: self Schedule Follow-up Appointment: No Medication Reconciliation completed and No provided to Patient/Care Wyett Narine: Clinical Summary of Care: Notes Patient has an HBO treatment scheduled on 04/14/15. Electronic Signature(s) Signed: 04/13/2015 4:12:29 PM By: Lorine Bears RCP, RRT, CHT Entered By: Lorine Bears on 04/13/2015 15:17:27 [name] ([id number]) -------------------------------------------------------------------------------- Vitals Details [name] Date of Service: 04/13/2015 1:00 PM Patient Name: J. Patient Account Number: [id number] Medical Record Treating RN: [id number] Number: Other Clinician: [name] Date of Birth/Sex: 07/16/1965 (50 y.o. Male) Treating [name], [name] Primary Care Physician/Extender: Floyde Parkins Physician: Referring Physician: Sharilyn Sites in Treatment: 13 Vital Signs Time Taken: 12:54 Temperature (F): 98.5 Height (in): 74 Pulse (bpm): 84 Weight (lbs): 288 Respiratory Rate (breaths/min): 18 Body Mass Index (BMI): 37 Blood Pressure (mmHg): 132/78 Capillary Blood Glucose (mg/dl): 152 Reference Range: 80 - 120 mg / dl Electronic Signature(s) Signed: 04/13/2015 4:12:29 PM By: Lorine Bears RCP, RRT, CHT Entered By: Becky Sax, Amado Nash on 04/13/2015 13:01:12

## 2015-04-13 NOTE — Progress Notes (Signed)
QUINT, MAGA ([id number]) Visit Report for 04/09/2015 Arrival Information Details Patient Name: [name], [name]. Date of Service: 04/09/2015 12:45 PM Medical Record Number: [id number] Patient Account Number: [id number] Date of Birth/Sex: [birth year] (50 y.o. Male) Treating RN: Ahmed Prima Primary Care Physician: [name] Other Clinician: Referring Physician: [name] Treating Physician/Extender: Frann Rider in Treatment: 13 Visit Information History Since Last Visit All ordered tests and consults were completed: No Patient Arrived: Ambulatory Added or deleted any medications: No Arrival Time: 12:44 Any new allergies or adverse reactions: No Accompanied By: self Had a fall or experienced change in No Transfer Assistance: None activities of daily living that may affect Patient Identification Verified: Yes risk of falls: Secondary Verification Process Yes Signs or symptoms of abuse/neglect since last No Completed: visito Patient Requires Transmission-Based No Hospitalized since last visit: No Precautions: Pain Present Now: No Patient Has Alerts: Yes Patient Alerts: DM II Electronic Signature(s) Signed: 04/13/2015 4:14:54 PM By: Alric Quan Entered By: Alric Quan on 04/09/2015 12:45:53 [name] ([id number]) -------------------------------------------------------------------------------- Clinic Level of Care Assessment Details Patient Name: [name] Date of Service: 04/09/2015 12:45 PM Medical Record Number: [id number] Patient Account Number: [id number] Date of Birth/Sex: 1965-12-30 (50 y.o. Male) Treating RN: Ahmed Prima Primary Care Physician: [name] Other Clinician: Referring Physician: [name] Treating Physician/Extender: Frann Rider in Treatment: 13 Clinic Level of Care Assessment Items TOOL 4 Quantity Score []  - Use when only an EandM is performed on FOLLOW-UP visit  0 ASSESSMENTS - Nursing Assessment / Reassessment []  - Reassessment of Co-morbidities (includes updates in patient status) 0 X - Reassessment of Adherence to Treatment Plan 1 5 ASSESSMENTS - Wound and Skin Assessment / Reassessment X - Simple Wound Assessment / Reassessment - one wound 1 5 []  - Complex Wound Assessment / Reassessment - multiple wounds 0 []  - Dermatologic / Skin Assessment (not related to wound area) 0 ASSESSMENTS - Focused Assessment []  - Circumferential Edema Measurements - multi extremities 0 []  - Nutritional Assessment / Counseling / Intervention 0 []  - Lower Extremity Assessment (monofilament, tuning fork, pulses) 0 []  - Peripheral Arterial Disease Assessment (using hand held doppler) 0 ASSESSMENTS - Ostomy and/or Continence Assessment and Care []  - Incontinence Assessment and Management 0 []  - Ostomy Care Assessment and Management (repouching, etc.) 0 PROCESS - Coordination of Care X - Simple Patient / Family Education for ongoing care 1 15 []  - Complex (extensive) Patient / Family Education for ongoing care 0 []  - Staff obtains Programmer, systems, Records, Test Results / Process Orders 0 []  - Staff telephones HHA, Nursing Homes / Clarify orders / etc 0 []  - Routine Transfer to another Facility (non-emergent condition) 0 [name], [name] ([id number]) []  - Routine Hospital Admission (non-emergent condition) 0 []  - New Admissions / Biomedical engineer / Ordering NPWT, Apligraf, etc. 0 []  - Emergency Hospital Admission (emergent condition) 0 X - Simple Discharge Coordination 1 10 []  - Complex (extensive) Discharge Coordination 0 PROCESS - Special Needs []  - Pediatric / Minor Patient Management 0 []  - Isolation Patient Management 0 []  - Hearing / Language / Visual special needs 0 []  - Assessment of Community assistance (transportation, D/C planning, etc.) 0 []  - Additional assistance / Altered mentation 0 []  - Support Surface(s) Assessment (bed, cushion, seat, etc.)  0 INTERVENTIONS - Wound Cleansing / Measurement X - Simple Wound Cleansing - one wound 1 5 []  - Complex Wound Cleansing - multiple wounds 0 X - Wound Imaging (photographs - any number of wounds) 1  5 []  - Wound Tracing (instead of photographs) 0 X - Simple Wound Measurement - one wound 1 5 []  - Complex Wound Measurement - multiple wounds 0 INTERVENTIONS - Wound Dressings X - Small Wound Dressing one or multiple wounds 1 10 []  - Medium Wound Dressing one or multiple wounds 0 []  - Large Wound Dressing one or multiple wounds 0 X - Application of Medications - topical 1 5 []  - Application of Medications - injection 0 INTERVENTIONS - Miscellaneous []  - External ear exam 0 [name], [name] ([id number]) []  - Specimen Collection (cultures, biopsies, blood, body fluids, etc.) 0 []  - Specimen(s) / Culture(s) sent or taken to Lab for analysis 0 []  - Patient Transfer (multiple staff / Harrel Lemon Lift / Similar devices) 0 []  - Simple Staple / Suture removal (25 or less) 0 []  - Complex Staple / Suture removal (26 or more) 0 []  - Hypo / Hyperglycemic Management (close monitor of Blood Glucose) 0 []  - Ankle / Brachial Index (ABI) - do not check if billed separately 0 X - Vital Signs 1 5 Has the patient been seen at the hospital within the last three years: Yes Total Score: 70 Level Of Care: New/Established - Level 2 Electronic Signature(s) Signed: 04/13/2015 4:14:54 PM By: Alric Quan Entered By: Alric Quan on 04/09/2015 13:08:25 [name] ([id number]) -------------------------------------------------------------------------------- Encounter Discharge Information Details Patient Name: [name] Date of Service: 04/09/2015 12:45 PM Medical Record Number: [id number] Patient Account Number: [id number] Date of Birth/Sex: 1965-12-25 (50 y.o. Male) Treating RN: Ahmed Prima Primary Care Physician: [name] Other Clinician: Referring Physician: [name] Treating Physician/Extender: Frann Rider in Treatment: 13 Encounter Discharge Information Items Discharge Pain Level: 0 Discharge Condition: Stable Ambulatory Status: Ambulatory Other (Note Discharge Destination: Required) Transportation: Other Accompanied By: self Schedule Follow-up Appointment: Yes Medication Reconciliation completed and provided to Patient/Care Yes Marine Lezotte: Clinical Summary of Care: Electronic Signature(s) Signed: 04/13/2015 4:14:54 PM By: Alric Quan Entered By: Alric Quan on 04/09/2015 13:09:46 [name] ([id number]) -------------------------------------------------------------------------------- Lower Extremity Assessment Details Patient Name: [name] Date of Service: 04/09/2015 12:45 PM Medical Record Number: [id number] Patient Account Number: [id number] Date of Birth/Sex: December 03, 1965 (50 y.o. Male) Treating RN: Ahmed Prima Primary Care Physician: [name] Other Clinician: Referring Physician: [name] Treating Physician/Extender: Frann Rider in Treatment: 13 Vascular Assessment Pulses: Posterior Tibial Dorsalis Pedis Palpable: [Right:Yes] Extremity colors, hair growth, and conditions: Extremity Color: [Right:Normal] Temperature of Extremity: [Right:Warm] Capillary Refill: [Right:< 3 seconds] Toe Nail Assessment Left: Right: Thick: No Discolored: No Deformed: No Improper Length and Hygiene: No Electronic Signature(s) Signed: 04/13/2015 4:14:54 PM By: Alric Quan Entered By: Alric Quan on 04/09/2015 12:50:16 [name] ([id number]) -------------------------------------------------------------------------------- Multi Wound Chart Details Patient Name: [name] Date of Service: 04/09/2015 12:45 PM Medical Record Number: [id number] Patient Account Number: [id number] Date of Birth/Sex: 26-Aug-1965 (50 y.o. 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] Comorbid History: [2:Hypertension, Type II Diabetes, Neuropathy] [N/A:N/A] Date Acquired: [2:02/19/2015] [N/A:N/A] Weeks of Treatment: [2:7] [N/A:N/A] Wound Status: [2:Open] [N/A:N/A] Measurements L x W x D 1.5x0.5x0.1 [N/A:N/A] (cm) Area (cm) : [2:0.589] [N/A:N/A]  Volume (cm) : [2:0.059] [N/A:N/A] Classification: [2:Grade 3] [N/A:N/A] Wagner Verification: [2:Abscess] [N/A:N/A] Exudate Amount: [2:Large] [N/A:N/A] Exudate Type: [2:Serosanguineous] [N/A:N/A] Exudate Color: [2:red, brown] [N/A:N/A] Wound Margin: [2:Flat and Intact] [N/A:N/A] Granulation Amount: [2:Large (67-100%)] [N/A:N/A] Granulation Quality: [2:Red] [N/A:N/A] Necrotic Amount: [2:Small (1-33%)] [N/A:N/A] Exposed Structures: [2:Fascia: No Fat: No Tendon: No Muscle: No Joint: No Bone: No] [N/A:N/A] Limited to Skin Breakdown Epithelialization: None N/A N/A Periwound Skin Texture: Edema: No N/A N/A Excoriation: No Induration: No Callus: No Crepitus: No Fluctuance: No Friable: No Rash: No Scarring: No Periwound Skin Moist: Yes N/A N/A Moisture: Maceration: No Dry/Scaly: No Periwound Skin Color: Erythema: Yes N/A N/A Atrophie Blanche: No Cyanosis: No Ecchymosis: No Hemosiderin Staining: No Mottled: No Pallor: No Rubor: No Erythema Location: Circumferential N/A N/A Temperature: Hot N/A N/A Tenderness on Yes N/A N/A Palpation: Wound Preparation: Ulcer  Cleansing: N/A N/A Rinsed/Irrigated with Saline Topical Anesthetic Applied: Other: lidocaine 4% Treatment Notes Electronic Signature(s) Signed: 04/13/2015 4:14:54 PM By: Alric Quan Entered By: Alric Quan on 04/09/2015 12:54:01 [name] ([id number]) -------------------------------------------------------------------------------- [place] Details Patient Name: [name], [name]. Date of Service: 04/09/2015 12:45 PM Medical Record Number: [id number] Patient Account Number: [id number] Date of Birth/Sex: Jan 25, 1965 (50 y.o. Male) Treating RN: Ahmed Prima Primary Care Physician: [name] Other Clinician: Referring Physician: [name] Treating Physician/Extender: Frann Rider in Treatment: 13 Active Inactive HBO Nursing Diagnoses: Anxiety related to feelings of confinement associated with the hyperbaric oxygen chamber Anxiety related to knowledge deficit of hyperbaric oxygen therapy and treatment procedures Discomfort related to temperature and humidity changes inside hyperbaric chamber Potential for barotraumas to ears, sinuses, teeth, and lungs or cerebral gas embolism related to changes in atmospheric pressure inside hyperbaric oxygen chamber Potential for oxygen toxicity seizures related to delivery of 100% oxygen at an increased atmospheric pressure Potential for pulmonary oxygen toxicity related to delivery of 100% oxygen at an increased atmospheric pressure Goals: Barotrauma will be prevented during HBO2 Date Initiated: 01/07/2015 Goal Status: Active Patient and/or family will be able to state/discuss factors appropriate to the management of their disease process during treatment Date Initiated: 01/07/2015 Goal Status: Active Patient will tolerate the hyperbaric oxygen therapy treatment Date Initiated: 01/07/2015 Goal Status: Active Patient will tolerate the internal climate of the chamber Date Initiated:  01/07/2015 Goal Status: Active Patient/caregiver will verbalize understanding of HBO goals, rationale, procedures and potential hazards Date Initiated: 01/07/2015 Goal Status: Active Signs and symptoms of pulmonary oxygen toxicity will be recognized and promptly addressed Date Initiated: 01/07/2015 Goal Status: Active Signs and symptoms of seizure will be recognized and promptly addressed ; seizing patients will suffer no harm Date Initiated: 01/07/2015 [name] ([id number]) Goal Status: Active Interventions: Administer a five (5) minute air break for patient if signs and symptoms of seizure appear and notify the hyperbaric physician Administer a ten (10) minute air break for patient if signs and symptoms of seizure appear and notify the hyperbaric physician Administer decongestants, per physician orders, prior to HBO2 Administer the correct therapeutic gas delivery based on the patients needs and limitations, per physician order Assess and provide for patientos comfort related to the hyperbaric environment and equalization of middle ear Assess for signs and symptoms related to adverse events, including but not limited to confinement anxiety, pneumothorax, oxygen toxicity and baurotrauma Assess patient for any history of confinement anxiety Assess patient's knowledge and expectations regarding hyperbaric medicine and provide education related to the hyperbaric environment, goals of treatment and prevention of adverse events Implement protocols to decrease  risk of pneumothorax in high risk patients Notes: Orientation to the Wound Care Program Nursing Diagnoses: Knowledge deficit related to the wound healing center program Goals: Patient/caregiver will verbalize understanding of the [facility] Date Initiated: 01/07/2015 Goal Status: Active Interventions: Provide education on orientation to the wound center Notes: Peripheral Neuropathy Nursing  Diagnoses: Knowledge deficit related to disease process and management of peripheral neurovascular dysfunction Potential alteration in peripheral tissue perfusion (select prior to confirmation of diagnosis) Goals: Patient/caregiver will verbalize understanding of disease process and disease management Date Initiated: 01/07/2015 Goal Status: Active [name], [name] ([id number]) Interventions: Assess signs and symptoms of neuropathy upon admission and as needed Provide education on Management of Neuropathy and Related Ulcers Provide education on Management of Neuropathy upon discharge from the [facility] for HBO Treatment Activities: Consult for HBO : 04/09/2015 Patient referred for customized footwear/offloading : 04/09/2015 Notes: Wound/Skin Impairment Nursing Diagnoses: Impaired tissue integrity Knowledge deficit related to ulceration/compromised skin integrity Goals: Patient/caregiver will verbalize understanding of skin care regimen Date Initiated: 01/07/2015 Goal Status: Active Ulcer/skin breakdown will have a volume reduction of 30% by week 4 Date Initiated: 01/07/2015 Goal Status: Active Ulcer/skin breakdown will have a volume reduction of 50% by week 8 Date Initiated: 01/07/2015 Goal Status: Active Ulcer/skin breakdown will have a volume reduction of 80% by week 12 Date Initiated: 01/07/2015 Goal Status: Active Ulcer/skin breakdown will heal within 14 weeks Date Initiated: 01/07/2015 Goal Status: Active Interventions: Assess patient/caregiver ability to obtain necessary supplies Assess patient/caregiver ability to perform ulcer/skin care regimen upon admission and as needed Assess ulceration(s) every visit Provide education on ulcer and skin care Treatment Activities: Referred to DME Lucille Crichlow for dressing supplies : 04/09/2015 Skin care regimen initiated : 04/09/2015 [name], [name] ([id number]) Topical wound management initiated : 04/09/2015 Notes: Electronic  Signature(s) Signed: 04/13/2015 4:14:54 PM By: Alric Quan Entered By: Alric Quan on 04/09/2015 12:53:55 [name] ([id number]) -------------------------------------------------------------------------------- Pain Assessment Details Patient Name: [name] Date of Service: 04/09/2015 12:45 PM Medical Record Number: [id number] Patient Account Number: [id number] Date of Birth/Sex: 1965/05/02 (50 y.o. Male) Treating RN: Ahmed Prima Primary Care Physician: [name] Other Clinician: Referring Physician: [name] Treating Physician/Extender: Frann Rider in Treatment: 13 Active Problems Location of Pain Severity and Description of Pain Patient Has Paino No Site Locations Pain Management and Medication Current Pain Management: Electronic Signature(s) Signed: 04/13/2015 4:14:54 PM By: Alric Quan Entered By: Alric Quan on 04/09/2015 12:46:19 [name] ([id number]) -------------------------------------------------------------------------------- Patient/Caregiver Education Details Patient Name: [name] Date of Service: 04/09/2015 12:45 PM Medical Record Number: [id number] Patient Account Number: [id number] Date of Birth/Gender: 01-08-1965 (50 y.o. Male) Treating RN: Ahmed Prima Primary Care Physician: [name] Other Clinician: Referring Physician: [name] Treating Physician/Extender: Frann Rider in Treatment: 13 Education Assessment Education Provided To: Patient Education Topics Provided Wound/Skin Impairment: Handouts: Other: change dressing as ordered Methods: Demonstration, Explain/Verbal Responses: State content correctly Electronic Signature(s) Signed: 04/13/2015 4:14:54 PM By: Alric Quan Entered By: Alric Quan on 04/09/2015 13:09:59 [name]  ([id number]) -------------------------------------------------------------------------------- Wound Assessment Details Patient Name: [name] Date of Service: 04/09/2015 12:45 PM Medical Record Number: [id number] Patient Account Number: [id number] Date of Birth/Sex: 1965/03/25 (50 y.o. Male) Treating RN: Ahmed Prima Primary Care Physician: [name] Other Clinician: Referring Physician: [name] Treating Physician/Extender: Frann Rider in Treatment: 13 Wound Status Wound Number: 2 Primary Diabetic Wound/Ulcer of the Lower Etiology: Extremity Wound Location: Right Metatarsal head fifth - Lateral Wound Status:  Open Wounding Event: Bump Comorbid Hypertension, Type II Diabetes, History: Neuropathy Date Acquired: 02/19/2015 Weeks Of Treatment: 7 Clustered Wound: No Photos Photo Uploaded By: Alric Quan on 04/09/2015 16:40:01 Wound Measurements Length: (cm) 1.5 Width: (cm) 0.5 Depth: (cm) 0.1 Area: (cm) 0.589 Volume: (cm) 0.059 % Reduction in Area: % Reduction in Volume: Epithelialization: None Tunneling: No Undermining: No Wound Description Classification: Grade 3 Wagner Verification: Abscess Wound Margin: Flat and Intact Exudate Amount: Large Exudate Type: Serosanguineous Exudate Color: red, brown Foul Odor After Cleansing: No Wound Bed Granulation Amount: Large (67-100%) Exposed Structure Granulation Quality: Red Fascia Exposed: No [name], [name] ([id number]) Necrotic Amount: Small (1-33%) Fat Layer Exposed: No Necrotic Quality: Adherent Slough Tendon Exposed: No Muscle Exposed: No Joint Exposed: No Bone Exposed: No Limited to Skin Breakdown Periwound Skin Texture Texture Color No Abnormalities Noted: No No Abnormalities Noted: No Callus: No Atrophie Blanche: No Crepitus: No Cyanosis: No Excoriation: No Ecchymosis: No Fluctuance: No Erythema: Yes Friable: No Erythema Location:  Circumferential Induration: No Hemosiderin Staining: No Localized Edema: No Mottled: No Rash: No Pallor: No Scarring: No Rubor: No Moisture Temperature / Pain No Abnormalities Noted: No Temperature: Hot Dry / Scaly: No Tenderness on Palpation: Yes Maceration: No Moist: Yes Wound Preparation Ulcer Cleansing: Rinsed/Irrigated with Saline Topical Anesthetic Applied: Other: lidocaine 4%, Treatment Notes Wound #2 (Right, Lateral Metatarsal head fifth) 1. Cleansed with: Clean wound with Normal Saline 2. Anesthetic Topical Lidocaine 4% cream to wound bed prior to debridement 3. Peri-wound Care: Skin Prep 4. Dressing Applied: Hydrafera Blue 5. Secondary Dressing Applied Bordered Foam Dressing Electronic Signature(s) Signed: 04/13/2015 4:14:54 PM By: Alric Quan Entered By: Alric Quan on 04/09/2015 12:51:56 [name], [name] ([id number]) EMYR, [name] ([id number]) -------------------------------------------------------------------------------- Vitals Details Patient Name: [name] Date of Service: 04/09/2015 12:45 PM Medical Record Number: [id number] Patient Account Number: [id number] Date of Birth/Sex: [birth year] (50 y.o. Male) Treating RN: Ahmed Prima Primary Care Physician: [name] Other Clinician: Referring Physician: [name] Treating Physician/Extender: Frann Rider in Treatment: 13 Vital Signs Time Taken: 12:46 Temperature (F): 98.1 Height (in): 74 Pulse (bpm): 99 Weight (lbs): 288 Respiratory Rate (breaths/min): 20 Body Mass Index (BMI): 37 Blood Pressure (mmHg): 156/76 Reference Range: 80 - 120 mg / dl Electronic Signature(s) Signed: 04/13/2015 4:14:54 PM By: Alric Quan Entered By: Alric Quan on 04/09/2015 12:48:39

## 2015-04-14 ENCOUNTER — Encounter: Payer: Managed Care, Other (non HMO) | Admitting: Internal Medicine

## 2015-04-14 DIAGNOSIS — E11621 Type 2 diabetes mellitus with foot ulcer: Secondary | ICD-10-CM | POA: Diagnosis not present

## 2015-04-14 LAB — GLUCOSE, CAPILLARY
Glucose-Capillary: 151 mg/dL — ABNORMAL HIGH (ref 65–99)
Glucose-Capillary: 186 mg/dL — ABNORMAL HIGH (ref 65–99)

## 2015-04-14 NOTE — Progress Notes (Signed)
CALIJAH, VANDERHOEF ([id number]) Visit Report for 04/14/2015 Arrival Information Details [name] Date of Service: 04/14/2015 8:00 AM Patient Name: J. Patient Account Number: [id number] Medical Record Treating RN: [id number] Number: Other Clinician: [name] Date of Birth/Sex: 1965/08/31 (50 y.o. Male) Treating [name], [name] Primary Care Physician/Extender: Floyde Parkins Physician: Referring Physician: Sharilyn Sites in Treatment: 13 Visit Information History Since Last Visit Added or deleted any medications: No Patient Arrived: Ambulatory Any new allergies or adverse reactions: No Arrival Time: 07:47 Had a fall or experienced change in No Accompanied By: self activities of daily living that may affect Transfer Assistance: None risk of falls: Patient Identification Verified: Yes Signs or symptoms of abuse/neglect No Secondary Verification Process Yes since last visito Completed: Hospitalized since last visit: No Patient Requires Transmission-Based No Has Dressing in Place as Prescribed: Yes Precautions: Has Footwear/Offloading in Place as Yes Patient Has Alerts: Yes Prescribed: Patient Alerts: DM II Left: Surgical Shoe with Pressure Relief Insole Pain Present Now: No Electronic Signature(s) Signed: 04/14/2015 1:00:47 PM By: Lorine Bears RCP, RRT, CHT Entered By: Becky Sax, Amado Nash on 04/14/2015 07:59:25 [name] ([id number]) -------------------------------------------------------------------------------- Encounter Discharge Information Details [name] Date of Service: 04/14/2015 8:00 AM Patient Name: J. Patient Account Number: [id number] Medical Record Treating RN: [id number] Number: Other Clinician: [name] Date of Birth/Sex: May 12, 1965 (50 y.o. Male) Treating [name], [name] Primary Care Physician/Extender: Floyde Parkins Physician: Referring Physician: Sharilyn Sites in Treatment: 13 Encounter Discharge Information Items Discharge Pain Level: 0 Discharge Condition: Stable Ambulatory Status: Ambulatory Discharge Destination: Home Private Transportation: Auto Accompanied By: self Schedule Follow-up Appointment: No Medication Reconciliation completed and No provided to Patient/Care Whitnie Deleon: Clinical Summary of Care: Notes Patient has an HBO treatment scheduled on 04/15/15 at 08:00 am. Electronic Signature(s) Signed: 04/14/2015 1:00:47 PM By: Lorine Bears RCP, RRT, CHT Entered By: Lorine Bears on 04/14/2015 10:22:04 [name] ([id number]) -------------------------------------------------------------------------------- Vitals Details [name] Date of Service: 04/14/2015 8:00 AM Patient Name: J. Patient Account Number: [id number] Medical Record Treating RN: [id number] Number: Other Clinician: [name] Date of Birth/Sex: 12-23-1965 (50 y.o. Male) Treating [name], [name] Primary Care Physician/Extender: Floyde Parkins Physician: Referring Physician: Sharilyn Sites in Treatment: 13 Vital Signs Time Taken: 07:48 Temperature (F): 98.4 Height (in): 74 Pulse (bpm): 84 Weight (lbs): 288 Respiratory Rate (breaths/min): 18 Body Mass Index (BMI): 37 Blood Pressure (mmHg): 148/80 Capillary Blood Glucose (mg/dl): 151 Reference Range: 80 - 120 mg / dl Electronic Signature(s) Signed: 04/14/2015 1:00:47 PM By: Lorine Bears RCP, RRT, CHT Entered By: Becky Sax, Amado Nash on 04/14/2015 08:00:12

## 2015-04-15 ENCOUNTER — Encounter: Payer: Managed Care, Other (non HMO) | Admitting: Surgery

## 2015-04-15 DIAGNOSIS — E11621 Type 2 diabetes mellitus with foot ulcer: Secondary | ICD-10-CM | POA: Diagnosis not present

## 2015-04-15 LAB — GLUCOSE, CAPILLARY
Glucose-Capillary: 137 mg/dL — ABNORMAL HIGH (ref 65–99)
Glucose-Capillary: 121 mg/dL — ABNORMAL HIGH (ref 65–99)

## 2015-04-15 NOTE — Progress Notes (Signed)
FENNER, PROBUS ([id number]) Visit Report for 04/15/2015 Arrival Information Details MANBIR, LOSCHIAVO 04/15/2015 8:00 Patient Name: Date of Service: [name] Medical Record Patient Account Number: [id number] Number: Treating RN: Date of Birth/Sex: 1965-09-02 (50 y.o. Male) Other Clinician: [name] Primary Care Physician: [name] Treating Christin Fudge Referring Physician: [name] Physician/Extender: Suella [place] in Treatment: 14 Visit Information History Since Last Visit Has Dressing in Place as Prescribed: Yes Patient Arrived: Ambulatory Has Footwear/Offloading in Place as Yes Arrival Time: 07:47 Prescribed: Accompanied By: self Left: Surgical Shoe with Transfer Assistance: None Pressure Relief Insole Patient Identification Verified: Yes Pain Present Now: No Secondary Verification Process Yes Completed: Patient Requires Transmission-Based No Precautions: Patient Has Alerts: Yes Patient Alerts: DM II Electronic Signature(s) Signed: 04/15/2015 2:33:42 PM By: Lorine Bears RCP, RRT, CHT Entered By: Becky Sax, Amado Nash on 04/15/2015 08:41:04 [name] ([id number]) -------------------------------------------------------------------------------- Encounter Discharge Information Details DEARIUS, MCENTIRE 04/15/2015 8:00 Patient Name: Date of Service: [name] Medical Record Patient Account Number: [id number] Number: Treating RN: Date of Birth/Sex: [birth year] (50 y.o. Male) Other Clinician: [name] Primary Care Physician: [name] Treating Christin Fudge Referring Physician: [name] Physician/Extender: Weeks in Treatment: 14 Encounter Discharge Information Items Discharge Pain Level: 0 Discharge Condition: Stable Ambulatory Status: Cane Discharge Destination: Home Private Transportation: Auto Accompanied By: self Schedule Follow-up Appointment: No Medication  Reconciliation completed and No provided to Patient/Care Melady Chow: Clinical Summary of Care: Notes Patient has an HBO treatment scheduled on 04/16/15 at 08:00 am. Electronic Signature(s) Signed: 04/15/2015 2:33:42 PM By: Lorine Bears RCP, RRT, CHT Entered By: Becky Sax, Amado Nash on 04/15/2015 10:57:31 [name] ([id number]) -------------------------------------------------------------------------------- Vitals Details Wemhoff, Lyon 04/15/2015 8:00 Patient Name: Date of Service: [name] Medical Record Patient Account Number: [id number] Number: Treating RN: Date of Birth/Sex: [birth year] (50 y.o. Male) Other Clinician: [name] Primary Care Physician: [name] Treating Britto, Errol Referring Physician: [name] Physician/Extender: Weeks in Treatment: 14 Vital Signs Time Taken: 07:48 Temperature (F): 98.0 Height (in): 74 Pulse (bpm): 84 Weight (lbs): 288 Respiratory Rate (breaths/min): 18 Body Mass Index (BMI): 37 Blood Pressure (mmHg): 148/74 Capillary Blood Glucose (mg/dl): 137 Reference Range: 80 - 120 mg / dl Electronic Signature(s) Signed: 04/15/2015 2:33:42 PM By: Lorine Bears RCP, RRT, CHT Entered By: Becky Sax, Amado Nash on 04/15/2015 08:43:51

## 2015-04-15 NOTE — Progress Notes (Signed)
[name], [name] ([id number]) Visit Report for 04/14/2015 HBO Details [name] Date of Service: 04/14/2015 8:00 AM Patient Name: J. Patient Account Number: [id number] Medical Record Treating RN: [id number] Number: Other Clinician: [name] Date of Birth/Sex: [birth year] (50 y.o. Male) Treating [name], [name] Primary Care Physician/Extender: Floyde Parkins Physician: Referring Physician: Sharilyn Sites in Treatment: 13 HBO Treatment Course Details Treatment Course Ordering Physician: Christin Fudge 1 Number: HBO Treatment Start Date: 02/01/2015 Total Treatments 60 Ordered: HBO Indication: Diabetic Ulcer(s) of the Lower Extremity Standard/Conservative Wound Care tried and failed greater than or equal to 30 days Wound #2 Right, Lateral Metatarsal head fifth HBO Treatment Details Treatment Number: 42 Patient Type: Outpatient Chamber Type: Monoplace Chamber Serial #: [id number] Treatment Protocol: 2.0 ATA with 90 minutes oxygen, and no air breaks Treatment Details Compression Rate Down: 1.5 psi / minute De-Compression Rate Up: 1.5 psi / minute Air breaks and breathing Compress Tx Pressure periods Decompress Decompress Begins Reached (leave unused spaces Begins Ends blank) Chamber Pressure (ATA) 1 2 - - - - - - 2 1 Clock Time (24 hr) 07:58 08:08 - - - - - - 09:38 09:48 Treatment Length: 110 (minutes) Treatment Segments: 4 Capillary Blood Glucose Pre Capillary Blood Glucose (mg/dl): Post Capillary Blood Glucose (mg/dl): Vital Signs Capillary Blood Glucose Reference Range: 80 - 120 mg / dl HBO Diabetic Blood Glucose Intervention Range: <131 mg/dl or >249 mg/dl Time Vitals Blood Respiratory Capillary Blood Glucose Pulse Action Type: Pulse: Temperature: Taken: Pressure: Rate: Glucose (mg/dl): Meter #: Oximetry (%) Taken: [name], [name] ([id number]) Pre 07:48 148/80 84 18 98.4 151 1 none Post 09:52 132/70 72 18 98.6 186 1 none Treatment  Response Treatment Completion Status: Treatment Completed without Adverse Event Physician Notes No concerns with treatment given HBO Attestation I certify that I supervised this HBO treatment in accordance with Medicare guidelines. A trained Yes emergency response team is readily available per hospital policies and procedures. Continue HBOT as ordered. Yes Electronic Signature(s) Signed: 04/14/2015 4:27:19 PM By: Linton Ham MD Entered By: Linton Ham on 04/14/2015 12:51:19 [name] ([id number]) -------------------------------------------------------------------------------- HBO Safety Checklist Details [name] Date of Service: 04/14/2015 8:00 AM Patient Name: J. Patient Account Number: [id number] Medical Record Treating RN: [id number] Number: Other Clinician: [name] Date of Birth/Sex: 10-18-1965 (50 y.o. Male) Treating [name], [name] Primary Care Physician/Extender: Floyde Parkins Physician: Referring Physician: Sharilyn Sites in Treatment: 13 HBO Safety Checklist Items Safety Checklist Consent Form Signed Patient voided / foley secured and emptied When did you last eato 07:00 am Last dose of injectable or oral agent 07:00 am NA Ostomy pouch emptied and vented if applicable NA All implantable devices assessed, documented and approved Intravenous access site secured and place Valuables secured Linens and cotton and cotton/polyester blend (less than 51% polyester) Personal oil-based products / skin lotions / body lotions removed Wigs or hairpieces removed Smoking or tobacco materials removed Books / newspapers / magazines / loose paper removed Cologne, aftershave, perfume and deodorant removed Jewelry removed (may wrap wedding band) Make-up removed Hair care products removed Battery operated devices (external) removed Heating patches and chemical warmers removed NA Titanium eyewear removed NA Nail polish cured greater  than 10 hours NA Casting material cured greater than 10 hours NA Hearing aids removed NA Loose dentures or partials removed NA Prosthetics have been removed Patient demonstrates correct use of air break device (if applicable) Patient concerns have been addressed Patient grounding bracelet on and cord attached to chamber Specifics for Inpatients (  complete in addition to above) Medication sheet sent with patient [name], [name] ([id number]) Intravenous medications needed or due during therapy sent with patient Drainage tubes (e.g. nasogastric tube or chest tube secured and vented) Endotracheal or Tracheotomy tube secured Cuff deflated of air and inflated with saline Airway suctioned Electronic Signature(s) Signed: 04/14/2015 1:00:47 PM By: Lorine Bears RCP, RRT, CHT Entered By: Lorine Bears on 04/14/2015 08:00:51

## 2015-04-15 NOTE — Progress Notes (Signed)
[name], [name] ([id number]) Visit Report for 04/15/2015 HBO Details [name], [name] 04/15/2015 8:00 Patient Name: Date of Service: [name] Medical Record Patient Account Number: [id number] Number: Treating RN: Date of Birth/Sex: 02-Jun-1965 (50 y.o. Male) Other Clinician: Jacqulyn [name] Primary Care Physician: [name] Treating [name] Referring Physician: [name] Physician/Extender: [name] in Treatment: 14 HBO Treatment Course Details Treatment Course Ordering Physician: [name] 1 Number: HBO Treatment Start Date: 02/01/2015 Total Treatments 60 Ordered: HBO Indication: Diabetic Ulcer(s) of the Lower Extremity Standard/Conservative Wound Care tried and failed greater than or equal to 30 days Wound #2 Right, Lateral Metatarsal head fifth HBO Treatment Details Treatment Number: 43 Patient Type: Outpatient Chamber Type: Monoplace Chamber Serial #: [id number] Treatment Protocol: 2.0 ATA with 90 minutes oxygen, and no air breaks Treatment Details Compression Rate Down: 1.5 psi / minute De-Compression Rate Up: 1.5 psi / minute Air breaks and breathing Compress Tx Pressure periods Decompress Decompress Begins Reached (leave unused spaces Begins Ends blank) Chamber Pressure (ATA) 1 2 - - - - - - 2 1 Clock Time (24 hr) 07:59 08:09 - - - - - - 09:39 09:49 Treatment Length: 110 (minutes) Treatment Segments: 4 Capillary Blood Glucose Pre Capillary Blood Glucose (mg/dl): Post Capillary Blood Glucose (mg/dl): Vital Signs Capillary Blood Glucose Reference Range: 80 - 120 mg / dl HBO Diabetic Blood Glucose Intervention Range: <131 mg/dl or >249 mg/dl Time Vitals Blood Respiratory Capillary Blood Glucose Pulse Action Type: Pulse: Temperature: Taken: Pressure: Rate: Glucose (mg/dl): Meter #: Oximetry (%) Taken: Pre 07:48 148/74 84 18 98 137 1 none Stettler, [name] J. ([id number]) Post 10:01 150/78 84 18 98 121 1 none Treatment  Response Treatment Completion Status: Treatment Completed without Adverse Event HBO Attestation I certify that I supervised this HBO treatment in accordance with Medicare guidelines. A trained Yes emergency response team is readily available per hospital policies and procedures. Continue HBOT as ordered. Yes Electronic Signature(s) Signed: 04/15/2015 11:02:29 AM By: [name] Fudge MD, FACS Entered By: [name] on 04/15/2015 11:02:29 [name] ([id number]) -------------------------------------------------------------------------------- HBO Safety Checklist Details [name], [name] 04/15/2015 8:00 Patient Name: Date of Service: [name] Medical Record Patient Account Number: [id number] Number: Treating RN: Date of Birth/Sex: [birth year] (50 y.o. Male) Other Clinician: Jacqulyn [name] Primary Care Physician: [name] Treating [name] Referring Physician: [name] Physician/Extender: [name] in Treatment: 14 HBO Safety Checklist Items Safety Checklist Consent Form Signed Patient voided / foley secured and emptied When did you last eato 07:00 am Last dose of injectable or oral agent 07:00 am NA Ostomy pouch emptied and vented if applicable NA All implantable devices assessed, documented and approved Intravenous access site secured and place PICC LINE Valuables secured Linens and cotton and cotton/polyester blend (less than 51% polyester) Personal oil-based products / skin lotions / body lotions removed Wigs or hairpieces removed Smoking or tobacco materials removed Books / newspapers / magazines / loose paper removed Cologne, aftershave, perfume and deodorant removed Jewelry removed (may wrap wedding band) Make-up removed Hair care products removed Battery operated devices (external) removed Heating patches and chemical warmers removed NA Titanium eyewear removed NA Nail polish cured greater than 10 hours NA Casting material  cured greater than 10 hours NA Hearing aids removed NA Loose dentures or partials removed NA Prosthetics have been removed Patient demonstrates correct use of air break device (if applicable) Patient concerns have been addressed Patient grounding bracelet on and cord attached to chamber Specifics for Inpatients (complete in addition to above) Medication  sheet sent with patient [name] ([id number]) Intravenous medications needed or due during therapy sent with patient Drainage tubes (e.g. nasogastric tube or chest tube secured and vented) Endotracheal or Tracheotomy tube secured Cuff deflated of air and inflated with saline Airway suctioned Electronic Signature(s) Signed: 04/15/2015 2:33:42 PM By: [name] [name] Entered By: [name] on 04/15/2015 08:45:36

## 2015-04-16 ENCOUNTER — Encounter (HOSPITAL_BASED_OUTPATIENT_CLINIC_OR_DEPARTMENT_OTHER): Payer: Managed Care, Other (non HMO) | Admitting: General Surgery

## 2015-04-16 ENCOUNTER — Encounter: Payer: Self-pay | Admitting: General Surgery

## 2015-04-16 DIAGNOSIS — E11621 Type 2 diabetes mellitus with foot ulcer: Secondary | ICD-10-CM

## 2015-04-16 DIAGNOSIS — M86171 Other acute osteomyelitis, right ankle and foot: Secondary | ICD-10-CM

## 2015-04-16 DIAGNOSIS — M86671 Other chronic osteomyelitis, right ankle and foot: Secondary | ICD-10-CM | POA: Diagnosis not present

## 2015-04-16 DIAGNOSIS — L97509 Non-pressure chronic ulcer of other part of unspecified foot with unspecified severity: Secondary | ICD-10-CM

## 2015-04-16 DIAGNOSIS — E1169 Type 2 diabetes mellitus with other specified complication: Secondary | ICD-10-CM

## 2015-04-16 DIAGNOSIS — M869 Osteomyelitis, unspecified: Secondary | ICD-10-CM | POA: Diagnosis not present

## 2015-04-16 LAB — GLUCOSE, CAPILLARY
Glucose-Capillary: 143 mg/dL — ABNORMAL HIGH (ref 65–99)
Glucose-Capillary: 148 mg/dL — ABNORMAL HIGH (ref 65–99)

## 2015-04-16 NOTE — Progress Notes (Signed)
Tolerated HBO for diabetic ulcer right foot

## 2015-04-16 NOTE — Progress Notes (Signed)
RANY, TENESACA ([id number]) Visit Report for 04/16/2015 Arrival Information Details Patient Name: [name], [name]. Date of Service: 04/16/2015 10:45 AM Medical Record Number: [id number] Patient Account Number: [id number] Date of Birth/Sex: 28-Jun-1965 (50 y.o. Male) Treating RN: Ahmed Prima Primary Care Physician: [name] Other Clinician: Referring Physician: [name] Treating Physician/Extender: Benjaman Pott in Treatment: 14 Visit Information History Since Last Visit All ordered tests and consults were completed: No Patient Arrived: Ambulatory Added or deleted any medications: No Arrival Time: 10:36 Any new allergies or adverse reactions: No Accompanied By: self Had a fall or experienced change in No Transfer Assistance: None activities of daily living that may affect Patient Identification Verified: Yes risk of falls: Secondary Verification Process Yes Signs or symptoms of abuse/neglect since last No Completed: visito Patient Requires Transmission-Based No Hospitalized since last visit: No Precautions: Pain Present Now: No Patient Has Alerts: Yes Patient Alerts: DM II Electronic Signature(s) Signed: 04/16/2015 1:16:43 PM By: Alric Quan Entered By: Alric Quan on 04/16/2015 10:38:50 [name] ([id number]) -------------------------------------------------------------------------------- Clinic Level of Care Assessment Details Patient Name: [name] Date of Service: 04/16/2015 10:45 AM Medical Record Number: [id number] Patient Account Number: [id number] Date of Birth/Sex: Dec 29, 1965 (50 y.o. Male) Treating RN: Ahmed Prima Primary Care Physician: [name] Other Clinician: Referring Physician: [name] Treating Physician/Extender: Benjaman Pott in Treatment: 14 Clinic Level of Care Assessment Items TOOL 4 Quantity Score []  - Use when only an EandM is performed on FOLLOW-UP visit  0 ASSESSMENTS - Nursing Assessment / Reassessment []  - Reassessment of Co-morbidities (includes updates in patient status) 0 X - Reassessment of Adherence to Treatment Plan 1 5 ASSESSMENTS - Wound and Skin Assessment / Reassessment X - Simple Wound Assessment / Reassessment - one wound 1 5 []  - Complex Wound Assessment / Reassessment - multiple wounds 0 []  - Dermatologic / Skin Assessment (not related to wound area) 0 ASSESSMENTS - Focused Assessment []  - Circumferential Edema Measurements - multi extremities 0 []  - Nutritional Assessment / Counseling / Intervention 0 []  - Lower Extremity Assessment (monofilament, tuning fork, pulses) 0 []  - Peripheral Arterial Disease Assessment (using hand held doppler) 0 ASSESSMENTS - Ostomy and/or Continence Assessment and Care []  - Incontinence Assessment and Management 0 []  - Ostomy Care Assessment and Management (repouching, etc.) 0 PROCESS - Coordination of Care X - Simple Patient / Family Education for ongoing care 1 15 []  - Complex (extensive) Patient / Family Education for ongoing care 0 []  - Staff obtains Programmer, systems, Records, Test Results / Process Orders 0 []  - Staff telephones HHA, Nursing Homes / Clarify orders / etc 0 []  - Routine Transfer to another Facility (non-emergent condition) 0 HAADI, MERENDA ([id number]) []  - Routine Hospital Admission (non-emergent condition) 0 []  - New Admissions / Biomedical engineer / Ordering NPWT, Apligraf, etc. 0 []  - Emergency Hospital Admission (emergent condition) 0 X - Simple Discharge Coordination 1 10 []  - Complex (extensive) Discharge Coordination 0 PROCESS - Special Needs []  - Pediatric / Minor Patient Management 0 []  - Isolation Patient Management 0 []  - Hearing / Language / Visual special needs 0 []  - Assessment of Community assistance (transportation, D/C planning, etc.) 0 []  - Additional assistance / Altered mentation 0 []  - Support Surface(s) Assessment (bed, cushion, seat, etc.)  0 INTERVENTIONS - Wound Cleansing / Measurement X - Simple Wound Cleansing - one wound 1 5 []  - Complex Wound Cleansing - multiple wounds 0 []  - Wound Imaging (photographs - any number of wounds) 0 []  -  Wound Tracing (instead of photographs) 0 X - Simple Wound Measurement - one wound 1 5 []  - Complex Wound Measurement - multiple wounds 0 INTERVENTIONS - Wound Dressings X - Small Wound Dressing one or multiple wounds 1 10 []  - Medium Wound Dressing one or multiple wounds 0 []  - Large Wound Dressing one or multiple wounds 0 []  - Application of Medications - topical 0 []  - Application of Medications - injection 0 INTERVENTIONS - Miscellaneous []  - External ear exam 0 COLLIN, CLARA ([id number]) []  - Specimen Collection (cultures, biopsies, blood, body fluids, etc.) 0 []  - Specimen(s) / Culture(s) sent or taken to Lab for analysis 0 []  - Patient Transfer (multiple staff / Harrel Lemon Lift / Similar devices) 0 []  - Simple Staple / Suture removal (25 or less) 0 []  - Complex Staple / Suture removal (26 or more) 0 []  - Hypo / Hyperglycemic Management (close monitor of Blood Glucose) 0 []  - Ankle / Brachial Index (ABI) - do not check if billed separately 0 X - Vital Signs 1 5 Has the patient been seen at the hospital within the last three years: Yes Total Score: 60 Level Of Care: New/Established - Level 2 Electronic Signature(s) Signed: 04/16/2015 1:16:43 PM By: Alric Quan Entered By: Alric Quan on 04/16/2015 11:07:37 [name] ([id number]) -------------------------------------------------------------------------------- Encounter Discharge Information Details Patient Name: [name] Date of Service: 04/16/2015 10:45 AM Medical Record Number: [id number] Patient Account Number: [id number] Date of Birth/Sex: 1965-01-17 (50 y.o. Male) Treating RN: Ahmed Prima Primary Care Physician: [name] Other Clinician: Referring Physician: [name] Treating Physician/Extender: Benjaman Pott in Treatment: 14 Encounter Discharge Information Items Discharge Pain Level: 0 Discharge Condition: Stable Ambulatory Status: Ambulatory Discharge Destination: Home Transportation: Private Auto Accompanied By: self Schedule Follow-up Appointment: Yes Medication Reconciliation completed Yes and provided to Patient/Care Lexus Barletta: Patient Clinical Summary of Care: Declined Electronic Signature(s) Signed: 04/16/2015 11:10:28 AM By: Ruthine Dose Entered By: Ruthine Dose on 04/16/2015 11:10:28 [name] ([id number]) -------------------------------------------------------------------------------- Lower Extremity Assessment Details Patient Name: [name] Date of Service: 04/16/2015 10:45 AM Medical Record Number: [id number] Patient Account Number: [id number] Date of Birth/Sex: 06-13-1965 (50 y.o. Male) Treating RN: Ahmed Prima Primary Care Physician: [name] Other Clinician: Referring Physician: [name] Treating Physician/Extender: Benjaman Pott in Treatment: 14 Vascular Assessment Pulses: Posterior Tibial Dorsalis Pedis Palpable: [Right:Yes] Extremity colors, hair growth, and conditions: Extremity Color: [Right:Normal] Temperature of Extremity: [Right:Warm] Capillary Refill: [Right:< 3 seconds] Toe Nail Assessment Left: Right: Thick: No Discolored: No Deformed: No Improper Length and Hygiene: No Electronic Signature(s) Signed: 04/16/2015 1:16:43 PM By: Alric Quan Entered By: Alric Quan on 04/16/2015 10:41:50 [name] ([id number]) -------------------------------------------------------------------------------- Multi Wound Chart Details Patient Name: [name] Date of Service: 04/16/2015 10:45 AM Medical Record Number: [id number] Patient Account Number: [id number] Date of Birth/Sex: [birth year] (50 y.o. 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] Comorbid History: [2:Hypertension, Type II Diabetes, Neuropathy] [N/A:N/A] Date Acquired: [2:02/19/2015] [N/A:N/A] Weeks of Treatment: [2:8] [N/A:N/A] Wound Status: [2:Open] [N/A:N/A] Measurements L x W x D 1x0.4x0.1 [N/A:N/A] (cm) Area (cm) : [id number] [N/A:N/A] Volume (cm) : [  2:0.031] [N/A:N/A] Classification: [2:Grade 3] [N/A:N/A] Wagner Verification: [2:Abscess] [N/A:N/A] Exudate Amount: [2:Large] [N/A:N/A] Exudate Type: [2:Serosanguineous] [N/A:N/A] Exudate Color: [2:red, brown] [N/A:N/A] Wound Margin: [2:Flat and Intact] [N/A:N/A] Granulation Amount: [2:Large (67-100%)] [N/A:N/A] Granulation Quality: [2:Red] [N/A:N/A] Necrotic Amount: [2:Small (1-33%)] [N/A:N/A] Exposed Structures: [2:Fascia: No Fat: No Tendon: No Muscle: No Joint: No Bone: No] [N/A:N/A] Limited to Skin Breakdown Epithelialization: None N/A N/A Periwound Skin Texture: Edema: No N/A N/A Excoriation: No Induration: No Callus: No Crepitus: No Fluctuance: No Friable: No Rash: No Scarring: No Periwound Skin Moist: Yes N/A N/A Moisture: Maceration: No Dry/Scaly: No Periwound Skin Color: Erythema: Yes N/A N/A Atrophie Blanche: No Cyanosis: No Ecchymosis: No Hemosiderin Staining: No Mottled: No Pallor: No Rubor: No Erythema Location: Circumferential N/A N/A Temperature: Hot N/A N/A Tenderness on Yes N/A N/A Palpation: Wound Preparation: Ulcer  Cleansing: N/A N/A Rinsed/Irrigated with Saline Topical Anesthetic Applied: Other: lidocaine 4% Treatment Notes Electronic Signature(s) Signed: 04/16/2015 1:16:43 PM By: Alric Quan Entered By: Alric Quan on 04/16/2015 10:44:49 [name] ([id number]) -------------------------------------------------------------------------------- [place] Details Patient Name: [name], [name]. Date of Service: 04/16/2015 10:45 AM Medical Record Number: [id number] Patient Account Number: [id number] Date of Birth/Sex: 1965-04-19 (50 y.o. Male) Treating RN: Ahmed Prima Primary Care Physician: [name] Other Clinician: Referring Physician: [name] Treating Physician/Extender: Benjaman Pott in Treatment: 14 Active Inactive HBO Nursing Diagnoses: Anxiety related to feelings of confinement associated with the hyperbaric oxygen chamber Anxiety related to knowledge deficit of hyperbaric oxygen therapy and treatment procedures Discomfort related to temperature and humidity changes inside hyperbaric chamber Potential for barotraumas to ears, sinuses, teeth, and lungs or cerebral gas embolism related to changes in atmospheric pressure inside hyperbaric oxygen chamber Potential for oxygen toxicity seizures related to delivery of 100% oxygen at an increased atmospheric pressure Potential for pulmonary oxygen toxicity related to delivery of 100% oxygen at an increased atmospheric pressure Goals: Barotrauma will be prevented during HBO2 Date Initiated: 01/07/2015 Goal Status: Active Patient and/or family will be able to state/discuss factors appropriate to the management of their disease process during treatment Date Initiated: 01/07/2015 Goal Status: Active Patient will tolerate the hyperbaric oxygen therapy treatment Date Initiated: 01/07/2015 Goal Status: Active Patient will tolerate the internal climate of the chamber Date Initiated:  01/07/2015 Goal Status: Active Patient/caregiver will verbalize understanding of HBO goals, rationale, procedures and potential hazards Date Initiated: 01/07/2015 Goal Status: Active Signs and symptoms of pulmonary oxygen toxicity will be recognized and promptly addressed Date Initiated: 01/07/2015 Goal Status: Active Signs and symptoms of seizure will be recognized and promptly addressed ; seizing patients will suffer no harm Date Initiated: 01/07/2015 [name] ([id number]) Goal Status: Active Interventions: Administer a five (5) minute air break for patient if signs and symptoms of seizure appear and notify the hyperbaric physician Administer a ten (10) minute air break for patient if signs and symptoms of seizure appear and notify the hyperbaric physician Administer decongestants, per physician orders, prior to HBO2 Administer the correct therapeutic gas delivery based on the patients needs and limitations, per physician order Assess and provide for patientos comfort related to the hyperbaric environment and equalization of middle ear Assess for signs and symptoms related to adverse events, including but not limited to confinement anxiety, pneumothorax, oxygen toxicity and baurotrauma Assess patient for any history of confinement anxiety Assess patient's knowledge and expectations regarding hyperbaric medicine and provide education related to the hyperbaric environment, goals of treatment and prevention of adverse events Implement protocols to decrease risk of pneumothorax  in high risk patients Notes: Orientation to the Wound Care Program Nursing Diagnoses: Knowledge deficit related to the wound healing center program Goals: Patient/caregiver will verbalize understanding of the [facility] Program Date Initiated: 01/07/2015 Goal Status: Active Interventions: Provide education on orientation to the wound center Notes: Peripheral Neuropathy Nursing  Diagnoses: Knowledge deficit related to disease process and management of peripheral neurovascular dysfunction Potential alteration in peripheral tissue perfusion (select prior to confirmation of diagnosis) Goals: Patient/caregiver will verbalize understanding of disease process and disease management Date Initiated: 01/07/2015 Goal Status: Active [name], [name] ([id number]) Interventions: Assess signs and symptoms of neuropathy upon admission and as needed Provide education on Management of Neuropathy and Related Ulcers Provide education on Management of Neuropathy upon discharge from the [facility] for HBO Treatment Activities: Consult for HBO : 04/16/2015 Patient referred for customized footwear/offloading : 04/16/2015 Notes: Wound/Skin Impairment Nursing Diagnoses: Impaired tissue integrity Knowledge deficit related to ulceration/compromised skin integrity Goals: Patient/caregiver will verbalize understanding of skin care regimen Date Initiated: 01/07/2015 Goal Status: Active Ulcer/skin breakdown will have a volume reduction of 30% by week 4 Date Initiated: 01/07/2015 Goal Status: Active Ulcer/skin breakdown will have a volume reduction of 50% by week 8 Date Initiated: 01/07/2015 Goal Status: Active Ulcer/skin breakdown will have a volume reduction of 80% by week 12 Date Initiated: 01/07/2015 Goal Status: Active Ulcer/skin breakdown will heal within 14 weeks Date Initiated: 01/07/2015 Goal Status: Active Interventions: Assess patient/caregiver ability to obtain necessary supplies Assess patient/caregiver ability to perform ulcer/skin care regimen upon admission and as needed Assess ulceration(s) every visit Provide education on ulcer and skin care Treatment Activities: Referred to DME Victora Irby for dressing supplies : 04/16/2015 Skin care regimen initiated : 04/16/2015 [name], [name] ([id number]) Topical wound management initiated : 04/16/2015 Notes: Electronic  Signature(s) Signed: 04/16/2015 1:16:43 PM By: Alric Quan Entered By: Alric Quan on 04/16/2015 10:44:42 [name] ([id number]) -------------------------------------------------------------------------------- Pain Assessment Details Patient Name: [name] Date of Service: 04/16/2015 10:45 AM Medical Record Number: [id number] Patient Account Number: [id number] Date of Birth/Sex: 06/03/1965 (50 y.o. Male) Treating RN: Ahmed Prima Primary Care Physician: [name] Other Clinician: Referring Physician: [name] Treating Physician/Extender: Benjaman Pott in Treatment: 14 Active Problems Location of Pain Severity and Description of Pain Patient Has Paino No Site Locations Pain Management and Medication Current Pain Management: Electronic Signature(s) Signed: 04/16/2015 1:16:43 PM By: Alric Quan Entered By: Alric Quan on 04/16/2015 10:38:56 [name] ([id number]) -------------------------------------------------------------------------------- Patient/Caregiver Education Details Patient Name: [name] Date of Service: 04/16/2015 10:45 AM Medical Record Number: [id number] Patient Account Number: [id number] Date of Birth/Gender: January 14, 1965 (50 y.o. Male) Treating RN: Ahmed Prima Primary Care Physician: [name] Other Clinician: Referring Physician: [name] Treating Physician/Extender: Benjaman Pott in Treatment: 14 Education Assessment Education Provided To: Patient Education Topics Provided Wound/Skin Impairment: Handouts: Other: change dressing as ordered Methods: Demonstration, Explain/Verbal Responses: State content correctly Electronic Signature(s) Signed: 04/16/2015 1:16:43 PM By: Alric Quan Entered By: Alric Quan on 04/16/2015 11:09:54 [name]  ([id number]) -------------------------------------------------------------------------------- Wound Assessment Details Patient Name: [name] Date of Service: 04/16/2015 10:45 AM Medical Record Number: [id number] Patient Account Number: [id number] Date of Birth/Sex: June 13, 1965 (50 y.o. Male) Treating RN: Ahmed Prima Primary Care Physician: [name] Other Clinician: Referring Physician: [name] Treating Physician/Extender: Benjaman Pott in Treatment: 14 Wound Status Wound Number: 2 Primary Diabetic Wound/Ulcer of the Lower Etiology: Extremity Wound Location: Right Metatarsal head fifth - Lateral Wound Status: Open Wounding Event:  Bump Comorbid Hypertension, Type II Diabetes, History: Neuropathy Date Acquired: 02/19/2015 Weeks Of Treatment: 8 Clustered Wound: No Photos Photo Uploaded By: Alric Quan on 04/16/2015 13:14:41 Wound Measurements Length: (cm) 1 Width: (cm) 0.4 Depth: (cm) 0.1 Area: (cm) 0.314 Volume: (cm) 0.031 % Reduction in Area: % Reduction in Volume: Epithelialization: None Tunneling: No Undermining: No Wound Description Classification: Grade 3 Wagner Verification: Abscess Wound Margin: Flat and Intact Exudate Amount: Large Exudate Type: Serosanguineous Exudate Color: red, brown Foul Odor After Cleansing: No Wound Bed Granulation Amount: Large (67-100%) Exposed Structure Granulation Quality: Red Fascia Exposed: No [name], [name] ([id number]) Necrotic Amount: Small (1-33%) Fat Layer Exposed: No Necrotic Quality: Adherent Slough Tendon Exposed: No Muscle Exposed: No Joint Exposed: No Bone Exposed: No Limited to Skin Breakdown Periwound Skin Texture Texture Color No Abnormalities Noted: No No Abnormalities Noted: No Callus: No Atrophie Blanche: No Crepitus: No Cyanosis: No Excoriation: No Ecchymosis: No Fluctuance: No Erythema: Yes Friable: No Erythema Location:  Circumferential Induration: No Hemosiderin Staining: No Localized Edema: No Mottled: No Rash: No Pallor: No Scarring: No Rubor: No Moisture Temperature / Pain No Abnormalities Noted: No Temperature: Hot Dry / Scaly: No Tenderness on Palpation: Yes Maceration: No Moist: Yes Wound Preparation Ulcer Cleansing: Rinsed/Irrigated with Saline Topical Anesthetic Applied: Other: lidocaine 4%, Treatment Notes Wound #2 (Right, Lateral Metatarsal head fifth) 1. Cleansed with: Clean wound with Normal Saline 2. Anesthetic Topical Lidocaine 4% cream to wound bed prior to debridement 3. Peri-wound Care: Skin Prep 4. Dressing Applied: Promogran 5. Secondary Dressing Applied Bordered Foam Dressing Electronic Signature(s) Signed: 04/16/2015 1:16:43 PM By: Alric Quan Entered By: Alric Quan on 04/16/2015 10:43:35 [name], [name] ([id number]) [name], [name] ([id number]) -------------------------------------------------------------------------------- Vitals Details Patient Name: [name] Date of Service: 04/16/2015 10:45 AM Medical Record Number: [id number] Patient Account Number: [id number] Date of Birth/Sex: [birth year] (50 y.o. Male) Treating RN: Ahmed Prima Primary Care Physician: [name] Other Clinician: Referring Physician: [name] Treating Physician/Extender: Benjaman Pott in Treatment: 14 Vital Signs Time Taken: 10:39 Temperature (F): 98.1 Height (in): 74 Pulse (bpm): 82 Weight (lbs): 288 Respiratory Rate (breaths/min): 18 Body Mass Index (BMI): 37 Blood Pressure (mmHg): 169/80 Reference Range: 80 - 120 mg / dl Electronic Signature(s) Signed: 04/16/2015 1:16:43 PM By: Alric Quan Entered By: Alric Quan on 04/16/2015 10:40:33

## 2015-04-16 NOTE — Progress Notes (Addendum)
[name], [name] ([id number]) Visit Report for 04/16/2015 Arrival Information Details [name], [name] 04/16/2015 8:00 Patient Name: Date of Service: [name] Medical Record Patient Account Number: [id number] Number: Treating RN: Date of Birth/Sex: 1965/04/12 (50 y.o. Male) Other Clinician: [name] Primary Care Physician: Bryson Ha, [place] Referring Physician: [name] Physician/Extender: Suella Grove in Treatment: 14 Visit Information History Since Last Visit Added or deleted any medications: No Patient Arrived: Ambulatory Any new allergies or adverse reactions: No Arrival Time: 07:50 Had a fall or experienced change in No Accompanied By: self activities of daily living that may affect Transfer Assistance: None risk of falls: Patient Identification Verified: Yes Signs or symptoms of abuse/neglect No Secondary Verification Process Yes since last visito Completed: Hospitalized since last visit: No Patient Requires Transmission-Based No Has Dressing in Place as Prescribed: Yes Precautions: Has Footwear/Offloading in Place as Yes Patient Has Alerts: Yes Prescribed: Patient Alerts: DM II Left: Surgical Shoe with Pressure Relief Insole Pain Present Now: No Electronic Signature(s) Signed: 04/16/2015 1:47:30 PM By: Lorine Bears RCP, RRT, CHT Entered By: Becky Sax, Amado Nash on 04/16/2015 09:12:29 [name] ([id number]) -------------------------------------------------------------------------------- Encounter Discharge Information Details [name], [name] 04/16/2015 8:00 Patient Name: Date of Service: [name] Medical Record Patient Account Number: [id number] Number: Treating RN: Date of Birth/Sex: 1965/04/05 (50 y.o. Male) Other Clinician: [name] Primary Care Physician: Bryson Ha, [place] Referring Physician: [name] Physician/Extender: Weeks  in Treatment: 14 Encounter Discharge Information Items Discharge Pain Level: 0 Discharge Condition: Stable Ambulatory Status: Ambulatory Discharge Destination: Home Private Transportation: Auto Accompanied By: self Schedule Follow-up Appointment: No Medication Reconciliation completed and No provided to Patient/Care Lander Eslick: Clinical Summary of Care: Notes Patient has an HBO treatment scheduled on 04/19/15 at 08:00 am. Electronic Signature(s) Signed: 04/16/2015 1:47:30 PM By: Lorine Bears RCP, RRT, CHT Previous Signature: 04/16/2015 8:37:18 AM Version By: Judene Companion MD Previous Signature: 04/16/2015 8:36:17 AM Version By: Judene Companion MD Entered By: Lorine Bears on 04/16/2015 10:30:22 [name] ([id number]) -------------------------------------------------------------------------------- Patient/Caregiver Education Details [name], [name] 04/16/2015 8:00 Patient Name: Date of Service: [name] Medical Record Patient Account Number: [id number] Number: Treating RN: Date of Birth/Gender: July 15, 1965 (50 y.o. Male) Other Clinician: [name] Primary Care Physician: Bryson Ha, [place] Referring Physician: [name] Physician/Extender: Weeks in Treatment: 14 Education Assessment Education Provided To: Patient Education Topics Provided Engineer, maintenance) Signed: 04/16/2015 8:39:29 AM By: Judene Companion MD Previous Signature: 04/16/2015 8:37:05 AM Version By: Judene Companion MD Previous Signature: 04/16/2015 8:36:12 AM Version By: Judene Companion MD Entered By: Judene Companion on 04/16/2015 08:39:28 [name], [name] ([id number]) -------------------------------------------------------------------------------- Vitals Details [name], [name] 04/16/2015 8:00 Patient Name: Date of Service: [name] Medical Record Patient Account Number: [id number] Number: Treating RN: Date of  Birth/Sex: 22-Feb-1965 (50 y.o. Male) Other Clinician: [name] Primary Care Physician: Bryson Ha, [place] Referring Physician: [name] Physician/Extender: Weeks in Treatment: 14 Vital Signs Time Taken: 07:52 Temperature (F): 98.0 Height (in): 74 Pulse (bpm): 78 Weight (lbs): 288 Respiratory Rate (breaths/min): 18 Body Mass Index (BMI): 37 Blood Pressure (mmHg): 152/82 Capillary Blood Glucose (mg/dl): 143 Reference Range: 80 - 120 mg / dl Electronic Signature(s) Signed: 04/16/2015 1:47:30 PM By: Lorine Bears RCP, RRT, CHT Entered By: Becky Sax, Amado Nash on 04/16/2015 09:12:58

## 2015-04-16 NOTE — Progress Notes (Signed)
See I heal note 

## 2015-04-16 NOTE — Progress Notes (Signed)
[name], [name] ([id number]) Visit Report for 04/16/2015 Physician Orders Details [name], [name] 04/16/2015 8:00 Patient Name: Date of Service: [name] Medical Record Patient Account Number: [id number] Number: Treating RN: Date of Birth/Sex: 08-28-1965 (50 y.o. Male) Other Clinician: [name] Primary Care Physician: [name] Treating Jerline Pain, [place] Referring Physician: [name] Physician/Extender: Suella Grove in Treatment: 14 Verbal / Phone Orders: No Diagnosis Coding ICD-10 Coding Code Description E11.621 Type 2 diabetes mellitus with foot ulcer L97.512 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d L84 Corns and callosities L02.611 Cutaneous abscess of right foot M86.371 Chronic multifocal osteomyelitis, right ankle and foot Electronic Signature(s) Signed: 04/16/2015 8:39:11 AM By: Judene Companion MD Previous Signature: 04/16/2015 8:36:53 AM Version By: Judene Companion MD Previous Signature: 04/16/2015 8:35:57 AM Version By: Judene Companion MD Entered By: Judene Companion on 04/16/2015 08:39:11 [name], [name] ([id number]) -------------------------------------------------------------------------------- Problem List Details [name], [name] 04/16/2015 8:00 Patient Name: Date of Service: [name] Medical Record Patient Account Number: [id number] Number: Treating RN: Date of Birth/Sex: 01-Oct-1965 (50 y.o. Male) Other Clinician: [name] Primary Care Physician: Elizabeth Palau Referring Physician: [name] Physician/Extender: Weeks in Treatment: 14 Active Problems ICD-10 Encounter Code Description Active Date Diagnosis E11.621 Type 2 diabetes mellitus with foot ulcer 01/07/2015 Yes L97.512 Non-pressure chronic ulcer of other part of right foot with 01/07/2015 Yes fat layer exposed L84 Corns and callosities 01/07/2015 Yes L02.611 Cutaneous abscess of right foot 01/14/2015  Yes M86.371 Chronic multifocal osteomyelitis, right ankle and foot 01/28/2015 Yes Inactive Problems Resolved Problems Electronic Signature(s) Signed: 04/16/2015 8:38:43 AM By: Judene Companion MD Previous Signature: 04/16/2015 8:36:42 AM Version By: Judene Companion MD Previous Signature: 04/16/2015 8:35:44 AM Version By: Judene Companion MD Entered By: Judene Companion on 04/16/2015 08:38:43 [name] ([id number]) -------------------------------------------------------------------------------- SuperBill Details Patient Name: [name] Date of Service: 04/16/2015 Medical Record Number: [id number] Patient Account Number: [id number] Date of Birth/Sex: Nov 07, 1965 (50 y.o. 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 Chronic multifocal osteomyelitis, right ankle and foot Facility Procedures CPT4 Code: [id number] Description: (Facility Use Only) HBOT, full body chamber, 57min Modifier: Quantity: 4 Physician Procedures CPT4 Code Description: [id number] - WC PHYS HYPERBARIC OXYGEN THERAPY ICD-10 Description Diagnosis M86.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

## 2015-04-19 ENCOUNTER — Encounter (HOSPITAL_BASED_OUTPATIENT_CLINIC_OR_DEPARTMENT_OTHER): Payer: Managed Care, Other (non HMO) | Admitting: General Surgery

## 2015-04-19 DIAGNOSIS — E11621 Type 2 diabetes mellitus with foot ulcer: Secondary | ICD-10-CM | POA: Diagnosis not present

## 2015-04-19 DIAGNOSIS — M869 Osteomyelitis, unspecified: Secondary | ICD-10-CM

## 2015-04-19 DIAGNOSIS — L97509 Non-pressure chronic ulcer of other part of unspecified foot with unspecified severity: Secondary | ICD-10-CM

## 2015-04-19 DIAGNOSIS — E1169 Type 2 diabetes mellitus with other specified complication: Secondary | ICD-10-CM

## 2015-04-19 NOTE — Progress Notes (Signed)
[name], [name] ([id number]) Visit Report for 04/19/2015 Arrival Information Details [name], [name] 04/19/2015 8:00 Patient Name: Date of Service: [name] Medical Record Patient Account Number: [id number] Number: Treating RN: Date of Birth/Sex: [birth year] (50 y.o. Male) Other Clinician: [name] Primary Care Physician: [name] Treating Christin Fudge Referring Physician: [name] Physician/Extender: Suella Grove in Treatment: 14 Visit Information History Since Last Visit Added or deleted any medications: No Patient Arrived: Ambulatory Any new allergies or adverse reactions: No Arrival Time: 07:47 Had a fall or experienced change in No Accompanied By: self activities of daily living that may affect Transfer Assistance: None risk of falls: Patient Identification Verified: Yes Signs or symptoms of abuse/neglect No Secondary Verification Process Yes since last visito Completed: Hospitalized since last visit: No Patient Requires Transmission-Based No Has Dressing in Place as Prescribed: Yes Precautions: Has Footwear/Offloading in Place as Yes Patient Has Alerts: Yes Prescribed: Patient Alerts: DM II Left: Surgical Shoe with Pressure Relief Insole Pain Present Now: No Electronic Signature(s) Signed: 04/19/2015 1:15:51 PM By: Lorine Bears RCP, RRT, CHT Entered By: Becky Sax, Amado Nash on 04/19/2015 08:07:14 [name] -------------------------------------------------------------------------------- Encounter Discharge Information Details [name], [name] 04/19/2015 8:00 Patient Name: Date of Service: [name] Medical Record Patient Account Number: [id number] Number: Treating RN: Date of Birth/Sex: 1965/05/19 (50 y.o. Male) Other Clinician: [name] Primary Care Physician: Bryson Ha, [place] Referring Physician: [name] Physician/Extender: Weeks  in Treatment: 14 Encounter Discharge Information Items Discharge Pain Level: 0 Discharge Condition: Stable Ambulatory Status: Ambulatory Discharge Destination: Home Private Transportation: Auto Accompanied By: self Schedule Follow-up Appointment: No Medication Reconciliation completed and No provided to Patient/Care [name]: Clinical Summary of Care: Notes Patient has an HBO treatment scheduled on 04/20/15 at 08:00 [name]. Electronic Signature(s) Signed: 04/19/2015 10:55:05 [name] By: Judene Companion MD Previous Signature: 04/19/2015 10:53:25 [name] Version By: Judene Companion MD Previous Signature: 04/19/2015 9:30:27 [name] Version By: Judene Companion MD Previous Signature: 04/19/2015 9:27:49 [name] Version By: Judene Companion MD Entered By: Judene Companion on 04/19/2015 10:55:05 [name] -------------------------------------------------------------------------------- Patient/Caregiver Education Details [name], [name] 04/19/2015 8:00 Patient Name: Date of Service: [name] Medical Record Patient Account Number: [id number] Number: Treating RN: Date of Birth/Gender: 18-Dec-1965 (50 y.o. Male) Other Clinician: [name] Primary Care Physician: Bryson Ha, [place] Referring Physician: [name] Physician/Extender: Weeks in Treatment: 14 Education Assessment Education Provided To: Patient Education Topics Provided Electronic Signature(s) Signed: 04/19/2015 10:54:54 [name] By: Judene Companion MD Previous Signature: 04/19/2015 10:53:16 [name] Version By: Judene Companion MD Previous Signature: 04/19/2015 9:30:16 [name] Version By: Judene Companion MD Previous Signature: 04/19/2015 9:27:29 [name] Version By: Judene Companion MD Entered By: Judene Companion on 04/19/2015 10:54:54 [name], [name] ([id number]) -------------------------------------------------------------------------------- Vitals Details [name], [name] 04/19/2015 8:00 Patient Name: Date of  Service: [name] Medical Record Patient Account Number: [id number] Number: Treating RN: Date of Birth/Sex: 09-Oct-1965 (50 y.o. Male) Other Clinician: [name] Primary Care Physician: [name] Treating Christin Fudge Referring Physician: [name] Physician/Extender: Weeks in Treatment: 14 Vital Signs Time Taken: 07:52 Temperature (F): 98.3 Height (in): 74 Pulse (bpm): 72 Weight (lbs): 288 Respiratory Rate (breaths/min): 18 Body Mass Index (BMI): 37 Blood Pressure (mmHg): 138/72 Capillary Blood Glucose (mg/dl): 205 Reference Range: 80 - 120 mg / dl Electronic Signature(s) Signed: 04/19/2015 1:15:51 PM By: Lorine Bears RCP, RRT, CHT Entered By: Becky Sax, Amado Nash on 04/19/2015 08:07:54

## 2015-04-19 NOTE — Progress Notes (Addendum)
[name], [name] ([id number]) Visit Report for 04/19/2015 Physician Orders Details ANTINIO, BURTENSHAW 04/19/2015 8:00 Patient Name: Date of Service: [name] Medical Record Patient Account Number: [id number] Number: Treating RN: Date of Birth/Sex: 03-Nov-1965 (50 y.o. Male) Other Clinician: [name] Primary Care Physician: [name] Treating Jerline Pain, [place] Referring Physician: [name] Physician/Extender: Suella Grove in Treatment: 14 Verbal / Phone Orders: No Diagnosis Coding ICD-10 Coding Code Description E11.621 Type 2 diabetes mellitus with foot ulcer L97.512 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d L84 Corns and callosities L02.611 Cutaneous abscess of right foot M86.371 Chronic multifocal osteomyelitis, right ankle and foot Electronic Signature(s) Signed: 04/19/2015 10:54:43 AM By: Judene Companion MD Previous Signature: 04/19/2015 10:53:01 AM Version By: Judene Companion MD Previous Signature: 04/19/2015 10:52:26 AM Version By: Judene Companion MD Previous Signature: 04/19/2015 9:30:04 AM Version By: Judene Companion MD Previous Signature: 04/19/2015 9:28:26 AM Version By: Judene Companion MD Previous Signature: 04/19/2015 9:27:18 AM Version By: Judene Companion MD Entered By: Judene Companion on 04/19/2015 10:54:43 [name], [name] ([id number]) -------------------------------------------------------------------------------- Problem List Details [name], [name] 04/19/2015 8:00 Patient Name: Date of Service: [name] Medical Record Patient Account Number: [id number] Number: Treating RN: Date of Birth/Sex: January 23, 1965 (50 y.o. 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 Chronic multifocal osteomyelitis, right ankle and foot 01/28/2015 Yes Inactive Problems Resolved Problems Electronic Signature(s) Signed: 04/19/2015 10:54:31 AM By: Judene Companion MD Previous Signature: 04/19/2015 10:52:46 AM Version By: Judene Companion MD Previous Signature: 04/19/2015 10:52:12 AM Version By: Judene Companion MD Previous Signature: 04/19/2015 9:29:51 AM Version By: Judene Companion MD Previous Signature: 04/19/2015 9:28:10 AM Version By: Judene Companion MD Previous Signature: 04/19/2015 9:27:06 AM Version By: Judene Companion MD Entered By: Judene Companion on 04/19/2015 10:54:31 [name] ([id number]) -------------------------------------------------------------------------------- SuperBill Details Patient Name: [name]. Date of Service: 04/19/2015 Medical Record Number: [id number] Patient Account Number: [id number] Date of Birth/Sex: [birth year] (50 y.o. 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 Chronic multifocal osteomyelitis, right ankle and foot Facility Procedures CPT4: Description Modifier Quantity Code [id number] (Facility Use Only) HBOT, full body chamber, 19min 4 CPT4: [id number]-Physician attendance and supervision of hyperbaric oxygen 1 therapy, per session ICD-10 Description Diagnosis M86.371 Chronic multifocal osteomyelitis,  right ankle and foot E11.621 Type 2 diabetes mellitus with foot ulcer L97.512  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d L02.611 Cutaneous abscess of right foot Physician Procedures CPT4 Code: [id number] Description: [id number] - WC PHYS LEVEL 2 - EST PT ICD-10 Description Diagnosis E11.621 Type 2 diabetes mellitus with foot ulcer Modifier: Quantity: 1 Electronic Signature(s) Signed: 04/19/2015 10:54:20 AM By: Judene Companion MD Previous Signature: 04/19/2015 9:29:37 AM Version By: Judene Companion MD Entered By: Judene Companion on 04/19/2015 10:54:20

## 2015-04-19 NOTE — Progress Notes (Signed)
See iheal note 

## 2015-04-19 NOTE — Progress Notes (Signed)
[name], [name] ([id number]) Visit Report for 4/[name]/2017 HBO Details [name], [name] 4/[name]/2017 8:00 Patient Name: Date of Service: [name] Medical Record Patient Account Number: [id number] Number: Treating RN: Date of Birth/Sex: 04/19/1965 (50 y.o. Male) Other Clinician: Jacqulyn [name] Primary Care Physician: [name], [name] Referring Physician: [name] Physician/Extender: [name] [name] HBO Treatment Course Details Treatment Course Ordering Physician: [name] 1 Number: HBO Treatment Start Date: 02/01/2015 Total Treatments 60 Ordered: HBO Indication: Diabetic Ulcer(s) of the Lower Extremity Standard/Conservative Wound Care tried and failed greater than or equal to 30 days Wound #2 Right, Lateral Metatarsal head fifth HBO Treatment Details Treatment Number: 44 Patient Type: Outpatient Chamber Type: Monoplace Chamber Serial #: [id number] Treatment Protocol: 2.0 ATA with 90 minutes oxygen, and no air breaks Treatment Details Compression Rate Down: 1.5 psi / minute De-Compression Rate Up: 1.5 psi / minute Air breaks and breathing Compress Tx Pressure periods Decompress Decompress Begins Reached (leave unused spaces Begins Ends blank) Chamber Pressure (ATA) 1 2 - - - - - - 2 1 Clock Time (24 hr) 08:02 08:12 - - - - - - 09:43 09:53 Treatment Length: 111 (minutes) Treatment Segments: 4 Capillary Blood Glucose Pre Capillary Blood Glucose (mg/dl): Post Capillary Blood Glucose (mg/dl): Vital Signs Capillary Blood Glucose Reference Range: 80 - 120 mg / dl HBO Diabetic Blood Glucose Intervention Range: <131 mg/dl or >249 mg/dl Time Vitals Blood Respiratory Capillary Blood Glucose Pulse Action Type: Pulse: Temperature: Taken: Pressure: Rate: Glucose (mg/dl): Meter #: Oximetry (%) Taken: Pre 07:52 152/82 78 18 98 143 1 none [name], [name] ([id number]) Treatment Response Treatment Completion Status:  Treatment Completed without Adverse Event Electronic Signature(s) Signed: 4/[name]/2017 1:47:30 PM By: [name] RCP, RRT, CHT Signed: 04/19/2015 3:38:49 PM By: [name] Companion MD [name] on 04/[name]/2017 10:28:40 [name] ([id number]) -------------------------------------------------------------------------------- HBO Safety Checklist Details [name], [name] 4/[name]/2017 8:00 Patient Name: Date of Service: [name] Medical Record Patient Account Number: [id number] Number: Treating RN: Date of Birth/Sex: [birth year] (50 y.o. Male) Other Clinician: Jacqulyn [name] Primary Care Physician: [name], [name] Referring Physician: [name] Physician/Extender: [name] [name] HBO Safety Checklist Items Safety Checklist Consent Form Signed Patient voided / foley secured and emptied When did you last eato 06:30 [name] Last dose of injectable or oral agent 06:30 [name] NA Ostomy pouch emptied and vented if applicable NA All implantable devices assessed, documented and approved Intravenous access site secured and place Valuables secured Linens and cotton and cotton/polyester blend (less than 51% polyester) Personal oil-based products / skin lotions / body lotions removed Wigs or hairpieces removed Smoking or tobacco materials removed Books / newspapers / magazines / loose paper removed Cologne, aftershave, perfume and deodorant removed Jewelry removed (may wrap wedding band) Make-up removed Hair care products removed Battery operated devices (external) removed Heating patches and chemical warmers removed NA Titanium eyewear removed NA Nail polish cured greater than 10 hours NA Casting material cured greater than 10 hours NA Hearing aids removed NA Loose dentures or partials removed NA Prosthetics have been removed Patient demonstrates correct use of air break device (if applicable) Patient  concerns have been addressed Patient grounding bracelet on and cord attached to chamber Specifics for Inpatients (complete [name] addition to above) Medication sheet sent with patient [name] ([id number]) Intravenous medications needed or due during therapy sent with patient Drainage tubes (e.g. nasogastric tube or chest tube secured and vented) Endotracheal or Tracheotomy tube secured  Cuff deflated of air and inflated with saline Airway suctioned Electronic Signature(s) Signed: 4/[name]/2017 1:47:30 PM By: [name] RCP, RRT, CHT [name] on 04/[name]/2017 09:13:44

## 2015-04-19 NOTE — Progress Notes (Signed)
[name], [name] ([id number]) Visit Report for 04/16/2015 Physician Orders Details NATHIN, BETKER 04/16/2015 10:45 Patient Name: Date of Service: [name] Medical Record Patient Account Number: [id number] Number: Treating RN: Montey Hora Date of Birth/Sex: [birth year] (50 y.o. Male) Other Clinician: Primary Care Physician: Bryson Ha, Placido Hangartner Referring Physician: [name] Physician/Extender: Weeks in Treatment: 14 Verbal / Phone Orders: Yes Clinician: Montey Hora Read Back and Verified: Yes Diagnosis Coding Wound Cleansing Wound #2 Right,Lateral Metatarsal head fifth o Clean wound with Normal Saline. Anesthetic Wound #2 Right,Lateral Metatarsal head fifth o Topical Lidocaine 4% cream applied to wound bed prior to debridement Skin Barriers/Peri-Wound Care Wound #2 Right,Lateral Metatarsal head fifth o Skin Prep Primary Wound Dressing Wound #2 Right,Lateral Metatarsal head fifth o Promogran Secondary Dressing Wound #2 Right,Lateral Metatarsal head fifth o Boardered Foam Dressing Dressing Change Frequency Wound #2 Right,Lateral Metatarsal head fifth o Change dressing every other day. Follow-up Appointments Wound #2 Right,Lateral Metatarsal head fifth o Return Appointment in 1 week. Off-Loading TAYLIN, BOSSEN ([id number]) Wound #2 Right,Lateral Metatarsal head fifth o Open toe surgical shoe with peg assist. - front offload Electronic Signature(s) Signed: 04/16/2015 1:41:43 PM By: Montey Hora Signed: 04/19/2015 3:38:49 PM By: Judene Companion MD Entered By: Montey Hora on 04/16/2015 10:54:00 [name] ([id number]) -------------------------------------------------------------------------------- SuperBill Details Patient Name: [name] Date of Service: 04/16/2015 Medical Record Number: [id number] Patient Account Number: [id number] Date of Birth/Sex: 1965-08-03 (50 y.o.  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

## 2015-04-20 ENCOUNTER — Encounter: Payer: Managed Care, Other (non HMO) | Admitting: Internal Medicine

## 2015-04-20 DIAGNOSIS — E11621 Type 2 diabetes mellitus with foot ulcer: Secondary | ICD-10-CM | POA: Diagnosis not present

## 2015-04-20 LAB — GLUCOSE, CAPILLARY
Glucose-Capillary: 205 mg/dL — ABNORMAL HIGH (ref 65–99)
Glucose-Capillary: 157 mg/dL — ABNORMAL HIGH (ref 65–99)
Glucose-Capillary: 200 mg/dL — ABNORMAL HIGH (ref 65–99)
Glucose-Capillary: 121 mg/dL — ABNORMAL HIGH (ref 65–99)

## 2015-04-20 NOTE — Progress Notes (Signed)
KALED, AI ([id number]) Visit Report for 04/20/2015 Arrival Information Details [name] Date of Service: 04/20/2015 8:00 AM Patient Name: J. Patient Account Number: [id number] Medical Record Treating RN: [id number] Number: Other Clinician: [name] Date of Birth/Sex: 04/20/1965 (50 y.o. Male) Treating [name], [name] Primary Care Physician/Extender: Floyde Parkins Physician: Referring Physician: Sharilyn Sites in Treatment: 14 Visit Information History Since Last Visit Added or deleted any medications: No Patient Arrived: Ambulatory Any new allergies or adverse reactions: No Arrival Time: 07:50 Had a fall or experienced change in No Accompanied By: self activities of daily living that may affect Transfer Assistance: None risk of falls: Patient Identification Verified: Yes Hospitalized since last visit: No Secondary Verification Process Yes Has Dressing in Place as Prescribed: Yes Completed: Has Footwear/Offloading in Place as Yes Patient Requires Transmission-Based No Prescribed: Precautions: Left: Surgical Shoe with Patient Has Alerts: Yes Pressure Relief Insole Patient Alerts: DM II Pain Present Now: No Electronic Signature(s) Signed: 04/20/2015 3:03:16 PM By: Lorine Bears RCP, RRT, CHT Entered By: Becky Sax, Amado Nash on 04/20/2015 08:16:59 [name] -------------------------------------------------------------------------------- Encounter Discharge Information Details [name] Date of Service: 04/20/2015 8:00 AM Patient Name: J. Patient Account Number: [id number] Medical Record Treating RN: [id number] Number: Other Clinician: [name] Date of Birth/Sex: 05/11/1965 (50 y.o. Male) Treating [name], [name] Primary Care Physician/Extender: Floyde Parkins Physician: Referring Physician: Sharilyn Sites in Treatment: 14 Encounter Discharge Information  Items Discharge Pain Level: 0 Discharge Condition: Stable Ambulatory Status: Ambulatory Other (Note Discharge Destination: Required) Transportation: Private Auto Accompanied By: self Schedule Follow-up Appointment: No Medication Reconciliation completed and provided to Patient/Care No Any Mcneice: Clinical Summary of Care: Notes Patient has an HBO treatment scheduled on 04/21/15 at 08:00 am. Electronic Signature(s) Signed: 04/20/2015 3:03:16 PM By: Lorine Bears RCP, RRT, CHT Entered By: Lorine Bears on 04/20/2015 10:29:22 [name] -------------------------------------------------------------------------------- Vitals Details [name] Date of Service: 04/20/2015 8:00 AM Patient Name: J. Patient Account Number: [id number] Medical Record Treating RN: [id number] Number: Other Clinician: [name] Date of Birth/Sex: 1965/01/25 (50 y.o. Male) Treating [name], [name] Primary Care Physician/Extender: Floyde Parkins Physician: Referring Physician: Sharilyn Sites in Treatment: 14 Vital Signs Time Taken: 07:53 Temperature (F): 98.2 Height (in): 74 Pulse (bpm): 84 Weight (lbs): 288 Respiratory Rate (breaths/min): 18 Body Mass Index (BMI): 37 Blood Pressure (mmHg): 132/66 Capillary Blood Glucose (mg/dl): 200 Reference Range: 80 - 120 mg / dl Electronic Signature(s) Signed: 04/20/2015 3:03:16 PM By: Lorine Bears RCP, RRT, CHT Entered By: Becky Sax, Amado Nash on 04/20/2015 08:18:01

## 2015-04-21 ENCOUNTER — Encounter: Payer: Managed Care, Other (non HMO) | Admitting: Internal Medicine

## 2015-04-21 DIAGNOSIS — E11621 Type 2 diabetes mellitus with foot ulcer: Secondary | ICD-10-CM | POA: Diagnosis not present

## 2015-04-21 LAB — GLUCOSE, CAPILLARY
Glucose-Capillary: 215 mg/dL — ABNORMAL HIGH (ref 65–99)
Glucose-Capillary: 156 mg/dL — ABNORMAL HIGH (ref 65–99)

## 2015-04-21 NOTE — Progress Notes (Signed)
[name], [name] ([id number]) Visit Report for 04/19/2015 HBO Details [name], [name] 04/19/2015 8:00 Patient Name: Date of Service: [name] Medical Record Patient Account Number: [id number] Number: Treating RN: Date of Birth/Sex: Apr 28, 1965 (50 y.o. Male) Other Clinician: [name] Primary Care Physician: [name] Treating Britto, Errol Referring Physician: [name] Physician/Extender: Suella Grove in Treatment: 14 HBO Treatment Course Details Treatment Course Ordering Physician: Christin Fudge 1 Number: HBO Treatment Start Date: 02/01/2015 Total Treatments 60 Ordered: HBO Indication: Diabetic Ulcer(s) of the Lower Extremity Standard/Conservative Wound Care tried and failed greater than or equal to 30 days Wound #2 Right, Lateral Metatarsal head fifth HBO Treatment Details Treatment Number: 37 Patient Type: Outpatient Chamber Type: Monoplace Chamber Serial #: [id number] Treatment Protocol: 2.0 ATA with 90 minutes oxygen, and no air breaks Treatment Details Compression Rate Down: 1.5 psi / minute De-Compression Rate Up: 1.5 psi / minute Air breaks and breathing Compress Tx Pressure periods Decompress Decompress Begins Reached (leave unused spaces Begins Ends blank) Chamber Pressure (ATA) 1 2 - - - - - - 2 1 Clock Time (24 hr) 08:05 08:15 - - - - - - 09:45 09:56 Treatment Length: 111 (minutes) Treatment Segments: 4 Capillary Blood Glucose Pre Capillary Blood Glucose (mg/dl): Post Capillary Blood Glucose (mg/dl): Vital Signs Capillary Blood Glucose Reference Range: 80 - 120 mg / dl HBO Diabetic Blood Glucose Intervention Range: <131 mg/dl or >249 mg/dl Time Vitals Blood Respiratory Capillary Blood Glucose Pulse Action Type: Pulse: Temperature: Taken: Pressure: Rate: Glucose (mg/dl): Meter #: Oximetry (%) Taken: Pre 07:52 138/72 72 18 98.3 205 1 none [name], [name] J. ([id number]) Post 10:11 152/78 78 18 98.5 157 1 none Treatment  Response Treatment Completion Status: Treatment Completed without Adverse Event HBO Attestation I certify that I supervised this HBO treatment in accordance with Medicare guidelines. A trained Yes emergency response team is readily available per hospital policies and procedures. Continue HBOT as ordered. Yes Electronic Signature(s) Signed: 04/19/2015 10:54:03 AM By: Judene Companion MD Signed: 04/21/2015 7:51:43 AM By: Christin Fudge MD, FACS Previous Signature: 04/19/2015 9:29:10 AM Version By: Judene Companion MD Entered By: Judene Companion on 04/19/2015 10:54:03 [name] ([id number]) -------------------------------------------------------------------------------- HBO Safety Checklist Details [name], [name] 04/19/2015 8:00 Patient Name: Date of Service: [name] Medical Record Patient Account Number: [id number] Number: Treating RN: Date of Birth/Sex: 19-Sep-1965 (50 y.o. Male) Other Clinician: [name] Primary Care Physician: [name] Treating Britto, Errol Referring Physician: [name] Physician/Extender: Weeks in Treatment: 14 HBO Safety Checklist Items Safety Checklist Consent Form Signed Patient voided / foley secured and emptied When did you last eato 11:00 pm 04/18/15 Last dose of injectable or oral agent 07:00 am NA Ostomy pouch emptied and vented if applicable NA All implantable devices assessed, documented and approved Intravenous access site secured and place Valuables secured Linens and cotton and cotton/polyester blend (less than 51% polyester) Personal oil-based products / skin lotions / body lotions removed Wigs or hairpieces removed Smoking or tobacco materials removed Books / newspapers / magazines / loose paper removed Cologne, aftershave, perfume and deodorant removed Jewelry removed (may wrap wedding band) Make-up removed Hair care products removed Battery operated devices (external) removed Heating patches and  chemical warmers removed NA Titanium eyewear removed NA Nail polish cured greater than 10 hours NA Casting material cured greater than 10 hours NA Hearing aids removed NA Loose dentures or partials removed NA Prosthetics have been removed Patient demonstrates correct use of air break device (if applicable) Patient concerns have been addressed Patient  grounding bracelet on and cord attached to chamber Specifics for Inpatients (complete in addition to above) Medication sheet sent with patient ALTO, MESMER ([id number]) Intravenous medications needed or due during therapy sent with patient Drainage tubes (e.g. nasogastric tube or chest tube secured and vented) Endotracheal or Tracheotomy tube secured Cuff deflated of air and inflated with saline Airway suctioned Electronic Signature(s) Signed: 04/19/2015 10:53:46 AM By: Judene Companion MD Previous Signature: 04/19/2015 9:28:47 AM Version By: Judene Companion MD Entered By: Judene Companion on 04/19/2015 10:53:46

## 2015-04-21 NOTE — Progress Notes (Signed)
[name], PETTRY ([id number]) Visit Report for 04/21/2015 Arrival Information Details [name] Date of Service: 04/21/2015 8:00 AM Patient Name: J. Patient Account Number: [id number] Medical Record Treating RN: [id number] Number: Other Clinician: Date of Birth/Sex: 1966/01/01 (50 y.o. Male) Treating Dellia Nims, [name] Primary Care Physician/Extender: Floyde Parkins Physician: Referring Physician: Sharilyn Sites in Treatment: 14 Visit Information History Since Last Visit Added or deleted any medications: No Patient Arrived: Ambulatory Any new allergies or adverse reactions: No Arrival Time: 07:50 Had a fall or experienced change in No Accompanied By: self activities of daily living that may affect Transfer Assistance: None risk of falls: Patient Identification Verified: Yes Signs or symptoms of abuse/neglect No Secondary Verification Process Yes since last visito Completed: Hospitalized since last visit: No Patient Requires Transmission-Based No Has Dressing in Place as Prescribed: Yes Precautions: Has Footwear/Offloading in Place as Yes Patient Has Alerts: Yes Prescribed: Patient Alerts: DM II Left: Surgical Shoe with Pressure Relief Insole Pain Present Now: No Electronic Signature(s) Signed: 04/21/2015 2:05:31 PM By: Lorine Bears RCP, RRT, CHT Entered By: Lorine Bears on 04/21/2015 08:12:09 [name] -------------------------------------------------------------------------------- Encounter Discharge Information Details [name] Date of Service: 04/21/2015 8:00 AM Patient Name: J. Patient Account Number: [id number] Medical Record Treating RN: [id number] Number: Other Clinician: Date of Birth/Sex: 04-Feb-1965 (50 y.o. Male) Treating [name] Primary Care Physician/Extender: Floyde Parkins Physician: Referring Physician: Sharilyn Sites in Treatment: 14 Encounter  Discharge Information Items Discharge Pain Level: 0 Discharge Condition: Stable Ambulatory Status: Ambulatory Discharge Destination: Home Private Transportation: Auto Accompanied By: self Schedule Follow-up Appointment: No Medication Reconciliation completed and No provided to Patient/Care Rafaella Kole: Clinical Summary of Care: Notes Patient has an HBO treatment scheduled on 04/22/15 at 08:00 am. Electronic Signature(s) Signed: 04/21/2015 2:05:31 PM By: Lorine Bears RCP, RRT, CHT Entered By: Becky Sax, Amado Nash on 04/21/2015 10:33:32 [name] -------------------------------------------------------------------------------- Vitals Details [name] Date of Service: 04/21/2015 8:00 AM Patient Name: J. Patient Account Number: [id number] Medical Record Treating RN: [id number] Number: Other Clinician: Date of Birth/Sex: 11-10-1965 (50 y.o. Male) Treating [name] Primary Care Physician/Extender: Floyde Parkins Physician: Referring Physician: Sharilyn Sites in Treatment: 14 Vital Signs Time Taken: 07:53 Temperature (F): 98.3 Height (in): 74 Pulse (bpm): 84 Weight (lbs): 288 Respiratory Rate (breaths/min): 18 Body Mass Index (BMI): 37 Blood Pressure (mmHg): 154/64 Capillary Blood Glucose (mg/dl): 215 Reference Range: 80 - 120 mg / dl Electronic Signature(s) Signed: 04/21/2015 2:05:31 PM By: Lorine Bears RCP, RRT, CHT Entered By: Becky Sax, Amado Nash on 04/21/2015 08:12:37

## 2015-04-22 ENCOUNTER — Encounter: Payer: Managed Care, Other (non HMO) | Admitting: Surgery

## 2015-04-22 DIAGNOSIS — E11621 Type 2 diabetes mellitus with foot ulcer: Secondary | ICD-10-CM | POA: Diagnosis not present

## 2015-04-22 LAB — GLUCOSE, CAPILLARY
Glucose-Capillary: 191 mg/dL — ABNORMAL HIGH (ref 65–99)
Glucose-Capillary: 170 mg/dL — ABNORMAL HIGH (ref 65–99)

## 2015-04-22 NOTE — Progress Notes (Signed)
[name], [name] ([id number]) Visit Report for 04/22/2015 HBO Details REGORY, [name] 04/22/2015 8:00 Patient Name: Date of Service: [name] Medical Record Patient Account Number: [id number] Number: Treating RN: Date of Birth/Sex: 23-May-1965 (50 y.o. Male) Other Clinician: [name] Primary Care Physician: [name] Treating Britto, Errol Referring Physician: [name] Physician/Extender: Suella Grove in Treatment: 15 HBO Treatment Course Details Treatment Course Ordering Physician: Christin Fudge 1 Number: HBO Treatment Start Date: 02/01/2015 Total Treatments 60 Ordered: HBO Indication: Diabetic Ulcer(s) of the Lower Extremity Standard/Conservative Wound Care tried and failed greater than or equal to 30 days Wound #2 Right, Lateral Metatarsal head fifth HBO Treatment Details Treatment Number: 48 Patient Type: Outpatient Chamber Type: Monoplace Chamber Serial #: [id number] Treatment Protocol: 2.0 ATA with 90 minutes oxygen, and no air breaks Treatment Details Compression Rate Down: 1.5 psi / minute De-Compression Rate Up: 1.5 psi / minute Air breaks and breathing Compress Tx Pressure periods Decompress Decompress Begins Reached (leave unused spaces Begins Ends blank) Chamber Pressure (ATA) 1 2 - - - - - - 2 1 Clock Time (24 hr) 07:58 08:08 - - - - - - 09:39 09:49 Treatment Length: 111 (minutes) Treatment Segments: 4 Capillary Blood Glucose Pre Capillary Blood Glucose (mg/dl): Post Capillary Blood Glucose (mg/dl): Vital Signs Capillary Blood Glucose Reference Range: 80 - 120 mg / dl HBO Diabetic Blood Glucose Intervention Range: <131 mg/dl or >249 mg/dl Time Vitals Blood Respiratory Capillary Blood Glucose Pulse Action Type: Pulse: Temperature: Taken: Pressure: Rate: Glucose (mg/dl): Meter #: Oximetry (%) Taken: Pre 07:47 144/70 84 18 97.9 191 1 none [name], [name] ([id number]) Post 09:53 138/62 78 18 97.9 170 1 none Treatment  Response Treatment Completion Status: Treatment Completed without Adverse Event HBO Attestation I certify that I supervised this HBO treatment in accordance with Medicare guidelines. A trained Yes emergency response team is readily available per hospital policies and procedures. Continue HBOT as ordered. Yes Electronic Signature(s) Signed: 04/22/2015 11:50:10 AM By: Christin Fudge MD, FACS Previous Signature: 04/22/2015 11:36:32 AM Version By: Lorine Bears RCP, RRT, CHT Entered By: Christin Fudge on 04/22/2015 11:50:09 [name] -------------------------------------------------------------------------------- HBO Safety Checklist Details LEEMON, SHABANI 04/22/2015 8:00 Patient Name: Date of Service: [name] Medical Record Patient Account Number: [id number] Number: Treating RN: Date of Birth/Sex: Nov 03, 1965 (50 y.o. Male) Other Clinician: [name] Primary Care Physician: [name] Treating Britto, Errol Referring Physician: [name] Physician/Extender: Suella Grove in Treatment: 15 HBO Safety Checklist Items Safety Checklist Consent Form Signed Patient voided / foley secured and emptied When did you last eato 07:00 am Last dose of injectable or oral agent 07:00 am NA Ostomy pouch emptied and vented if applicable NA All implantable devices assessed, documented and approved Intravenous access site secured and place Valuables secured Linens and cotton and cotton/polyester blend (less than 51% polyester) Personal oil-based products / skin lotions / body lotions removed NA Wigs or hairpieces removed NA Smoking or tobacco materials removed Books / newspapers / magazines / loose paper removed Cologne, aftershave, perfume and deodorant removed Jewelry removed (may wrap wedding band) NA Make-up removed Hair care products removed Battery operated devices (external) removed Heating patches and chemical warmers  removed NA Titanium eyewear removed NA Nail polish cured greater than 10 hours NA Casting material cured greater than 10 hours NA Hearing aids removed NA Loose dentures or partials removed NA Prosthetics have been removed Patient demonstrates correct use of air break device (if applicable) Patient concerns have been addressed Patient grounding bracelet on  and cord attached to chamber Specifics for Inpatients (complete in addition to above) Medication sheet sent with patient [name] ([id number]) Intravenous medications needed or due during therapy sent with patient Drainage tubes (e.g. nasogastric tube or chest tube secured and vented) Endotracheal or Tracheotomy tube secured Cuff deflated of air and inflated with saline Airway suctioned Electronic Signature(s) Signed: 04/22/2015 11:36:32 AM By: Lorine Bears RCP, RRT, CHT Entered By: Lorine Bears on 04/22/2015 08:01:36

## 2015-04-22 NOTE — Progress Notes (Signed)
[name], [name] ([id number]) Visit Report for 04/20/2015 HBO Details [name] Date of Service: 04/20/2015 8:00 AM Patient Name: J. Patient Account Number: [id number] Medical Record Treating RN: [id number] Number: Other Clinician: [name] Date of Birth/Sex: May 27, 1965 (50 y.o. Male) Treating [name], [name] Primary Care Physician/Extender: Floyde Parkins Physician: Referring Physician: Sharilyn Sites in Treatment: 14 HBO Treatment Course Details Treatment Course Ordering Physician: Christin Fudge 1 Number: HBO Treatment Start Date: 02/01/2015 Total Treatments 60 Ordered: HBO Indication: Diabetic Ulcer(s) of the Lower Extremity Standard/Conservative Wound Care tried and failed greater than or equal to 30 days Wound #2 Right, Lateral Metatarsal head fifth HBO Treatment Details Treatment Number: 46 Patient Type: Outpatient Chamber Type: Monoplace Chamber Serial #: [id number] Treatment Protocol: 2.0 ATA with 90 minutes oxygen, and no air breaks Treatment Details Compression Rate Down: 1.5 psi / minute De-Compression Rate Up: 1.5 psi / minute Air breaks and breathing Compress Tx Pressure periods Decompress Decompress Begins Reached (leave unused spaces Begins Ends blank) Chamber Pressure (ATA) 1 2 - - - - - - 2 1 Clock Time (24 hr) 08:02 08:12 - - - - - - 09:42 09:52 Treatment Length: 110 (minutes) Treatment Segments: 4 Capillary Blood Glucose Pre Capillary Blood Glucose (mg/dl): Post Capillary Blood Glucose (mg/dl): Vital Signs Capillary Blood Glucose Reference Range: 80 - 120 mg / dl HBO Diabetic Blood Glucose Intervention Range: <131 mg/dl or >249 mg/dl Time Vitals Blood Respiratory Capillary Blood Glucose Pulse Action Type: Pulse: Temperature: Taken: Pressure: Rate: Glucose (mg/dl): Meter #: Oximetry (%) Taken: MICHARL, SABAN ([id number]) Pre 07:53 132/66 84 18 98.2 200 1 none Post 10:07 142/78 84 18 98.1 121 1 none Treatment  Response Treatment Completion Status: Treatment Completed without Adverse Event Physician Notes No concerns with treatment given HBO Attestation I certify that I supervised this HBO treatment in accordance with Medicare guidelines. A trained Yes emergency response team is readily available per hospital policies and procedures. Continue HBOT as ordered. Yes Electronic Signature(s) Signed: 04/21/2015 5:01:27 PM By: Linton Ham MD Previous Signature: 04/20/2015 3:03:16 PM Version By: Becky Sax, Sallie RCP, RRT, CHT Entered By: Linton Ham on 04/21/2015 07:40:42 [name] ([id number]) -------------------------------------------------------------------------------- HBO Safety Checklist Details [name] Date of Service: 04/20/2015 8:00 AM Patient Name: J. Patient Account Number: [id number] Medical Record Treating RN: [id number] Number: Other Clinician: [name] Date of Birth/Sex: December 25, 1965 (50 y.o. Male) Treating [name], [name] Primary Care Physician/Extender: Floyde Parkins Physician: Referring Physician: Sharilyn Sites in Treatment: 14 HBO Safety Checklist Items Safety Checklist Consent Form Signed Patient voided / foley secured and emptied When did you last eato 06:45 am Last dose of injectable or oral agent 07:00 am NA Ostomy pouch emptied and vented if applicable NA All implantable devices assessed, documented and approved Intravenous access site secured and place Valuables secured Linens and cotton and cotton/polyester blend (less than 51% polyester) Personal oil-based products / skin lotions / body lotions removed Wigs or hairpieces removed Smoking or tobacco materials removed Books / newspapers / magazines / loose paper removed Cologne, aftershave, perfume and deodorant removed Jewelry removed (may wrap wedding band) Make-up removed Hair care products removed Battery operated devices (external)  removed Heating patches and chemical warmers removed NA Titanium eyewear removed NA Nail polish cured greater than 10 hours NA Casting material cured greater than 10 hours NA Hearing aids removed NA Loose dentures or partials removed NA Prosthetics have been removed Patient demonstrates correct use of air break device (if applicable) Patient concerns have been  addressed Patient grounding bracelet on and cord attached to chamber Specifics for Inpatients (complete in addition to above) Medication sheet sent with patient [name] ([id number]) Intravenous medications needed or due during therapy sent with patient Drainage tubes (e.g. nasogastric tube or chest tube secured and vented) Endotracheal or Tracheotomy tube secured Cuff deflated of air and inflated with saline Airway suctioned Electronic Signature(s) Signed: 04/20/2015 3:03:16 PM By: Lorine Bears RCP, RRT, CHT Entered By: Becky Sax, Amado Nash on 04/20/2015 08:19:12

## 2015-04-22 NOTE — Progress Notes (Signed)
[name], [name] ([name]) Visit Report for 04/22/2015 Arrival Information Details [name], [name] 04/22/2015 8:00 Patient Name: Date of Service: [name] Number: Treating RN: Date of Birth/Sex: Aug 28, 1965 (50 y.o. Male) Other Clinician: [name] Primary Care Physician: [name] Treating Christin Fudge Referring Physician: [name] Physician/Extender: Suella Grove in Treatment: 15 Visit Information History Since Last Visit Added or deleted any medications: No Patient Arrived: Ambulatory Any new allergies or adverse reactions: No Arrival Time: 07:45 Had a fall or experienced change in No Accompanied By: self activities of daily living that may affect Transfer Assistance: None risk of falls: Patient Identification Verified: Yes Signs or symptoms of abuse/neglect No Secondary Verification Process Yes since last visito Completed: Hospitalized since last visit: No Patient Requires Transmission-Based No Has Dressing in Place as Prescribed: Yes Precautions: Has Footwear/Offloading in Place as Yes Patient Has Alerts: Yes Prescribed: Patient Alerts: DM II Left: Surgical Shoe with Pressure Relief Insole Pain Present Now: No Electronic Signature(s) Signed: 04/22/2015 11:36:32 AM By: Lorine Bears RCP, RRT, CHT Entered By: Becky Sax, Amado Nash on 04/22/2015 08:00:19 [name] Simmers ([name]) -------------------------------------------------------------------------------- Encounter Discharge Information Details [name], [name] 04/22/2015 8:00 Patient Name: Date of Service: [name] Number: Treating RN: Date of Birth/Sex: 31-Dec-1965 (50 y.o. Male) Other Clinician: [name] Primary Care Physician: [name] Treating Christin Fudge Referring Physician: [name] Physician/Extender: Weeks  in Treatment: 15 Encounter Discharge Information Items Discharge Pain Level: 0 Discharge Condition: Stable Ambulatory Status: Ambulatory Discharge Destination: Home Private Transportation: Auto Accompanied By: self Schedule Follow-up Appointment: No Medication Reconciliation completed and No provided to Patient/Care Kyri Shader: Clinical Summary of Care: Notes Patient has an HBO treatment scheduled on 04/23/15 at 13:30 pm. Electronic Signature(s) Signed: 04/22/2015 11:36:32 AM By: Lorine Bears RCP, RRT, CHT Entered By: Becky Sax, Amado Nash on 04/22/2015 11:09:33 [name] Simmers ([name]) -------------------------------------------------------------------------------- Vitals Details [name], [name] 04/22/2015 8:00 Patient Name: Date of Service: [name] Number: Treating RN: Date of Birth/Sex: 1965-01-07 (50 y.o. Male) Other Clinician: [name] Primary Care Physician: [name] Treating Britto, Errol Referring Physician: [name] Physician/Extender: Weeks in Treatment: 15 Vital Signs Time Taken: 07:47 Temperature (F): 97.9 Height (in): 74 Pulse (bpm): 84 Weight (lbs): 288 Respiratory Rate (breaths/min): 18 Body Mass Index (BMI): 37 Blood Pressure (mmHg): 144/70 Capillary Blood Glucose (mg/dl): 191 Reference Range: 80 - 120 mg / dl Electronic Signature(s) Signed: 04/22/2015 11:36:32 AM By: Lorine Bears RCP, RRT, CHT Entered By: Lorine Bears on 04/22/2015 08:00:50

## 2015-04-22 NOTE — Progress Notes (Signed)
[name], [name] ([id number]) Visit Report for 04/21/2015 HBO Details [name] Date of Service: 04/21/2015 8:00 AM Patient Name: J. Patient Account Number: [id number] Medical Record Treating RN: [id number] Number: Other Clinician: Date of Birth/Sex: 1965/05/30 (50 y.o. Male) Treating [name] Primary Care Physician/Extender: [name] Physician: Referring Physician: Sharilyn [name] in Treatment: 14 HBO Treatment Course Details Treatment Course Ordering Physician: [name] 1 Number: HBO Treatment Start Date: 02/01/2015 Total Treatments 60 Ordered: HBO Indication: Diabetic Ulcer(s) of the Lower Extremity Standard/Conservative Wound Care tried and failed greater than or equal to 30 days Wound #2 Right, Lateral Metatarsal head fifth HBO Treatment Details Treatment Number: 47 Patient Type: Outpatient Chamber Type: Monoplace Chamber Serial #: [id number] Treatment Protocol: 2.0 ATA with 90 minutes oxygen, and no air breaks Treatment Details Compression Rate Down: 1.5 psi / minute De-Compression Rate Up: 1.5 psi / minute Air breaks and breathing Compress Tx Pressure periods Decompress Decompress Begins Reached (leave unused spaces Begins Ends blank) Chamber Pressure (ATA) 1 2 - - - - - - 2 1 Clock Time (24 hr) 08:03 08:13 - - - - - - 09:43 09:53 Treatment Length: 110 (minutes) Treatment Segments: 4 Capillary Blood Glucose Pre Capillary Blood Glucose (mg/dl): Post Capillary Blood Glucose (mg/dl): Vital Signs Capillary Blood Glucose Reference Range: 80 - 120 mg / dl HBO Diabetic Blood Glucose Intervention Range: <131 mg/dl or >249 mg/dl Time Vitals Blood Respiratory Capillary Blood Glucose Pulse Action Type: Pulse: Temperature: Taken: Pressure: Rate: Glucose (mg/dl): Meter #: Oximetry (%) Taken: [name], [name] ([id number]) Pre 07:53 154/64 84 18 98.3 215 1 none Post 09:58 150/76 84 18 98.2 156 1 none Treatment  Response Treatment Completion Status: Treatment Completed without Adverse Event Physician Notes No concerns or treatment given HBO Attestation I certify that I supervised this HBO treatment in accordance with Medicare guidelines. A trained Yes emergency response team is readily available per hospital policies and procedures. Continue HBOT as ordered. Yes Electronic Signature(s) Signed: 04/21/2015 5:01:27 PM By: [name] Ham MD Previous Signature: 04/21/2015 2:05:31 PM Version By: [name] RCP, RRT, CHT Entered By: [name] on 04/21/2015 16:59:47 [name] ([id number]) -------------------------------------------------------------------------------- HBO Safety Checklist Details [name] Date of Service: 04/21/2015 8:00 AM Patient Name: J. Patient Account Number: [id number] Medical Record Treating RN: [id number] Number: Other Clinician: Date of Birth/Sex: [birth year] (50 y.o. Male) Treating [name] Primary Care Physician/Extender: [name] Physician: Referring Physician: Sharilyn [name] in Treatment: 14 HBO Safety Checklist Items Safety Checklist Consent Form Signed Patient voided / foley secured and emptied When did you last eato 06:45 am Last dose of injectable or oral agent 07:00 am NA Ostomy pouch emptied and vented if applicable NA All implantable devices assessed, documented and approved Intravenous access site secured and place Valuables secured Linens and cotton and cotton/polyester blend (less than 51% polyester) Personal oil-based products / skin lotions / body lotions removed NA Wigs or hairpieces removed NA Smoking or tobacco materials removed Books / newspapers / magazines / loose paper removed Cologne, aftershave, perfume and deodorant removed Jewelry removed (may wrap wedding band) NA Make-up removed Hair care products removed Battery operated devices (external) removed Heating patches  and chemical warmers removed NA Titanium eyewear removed NA Nail polish cured greater than 10 hours NA Casting material cured greater than 10 hours NA Hearing aids removed NA Loose dentures or partials removed NA Prosthetics have been removed Patient demonstrates correct use of air break device (if applicable) Patient concerns have been addressed  Patient grounding bracelet on and cord attached to chamber Specifics for Inpatients (complete in addition to above) Medication sheet sent with patient [name], [name] ([id number]) Intravenous medications needed or due during therapy sent with patient Drainage tubes (e.g. nasogastric tube or chest tube secured and vented) Endotracheal or Tracheotomy tube secured Cuff deflated of air and inflated with saline Airway suctioned Electronic Signature(s) Signed: 04/21/2015 2:05:31 PM By: [name] RCP, RRT, CHT Entered By: [name], [name] on 04/21/2015 08:14:04

## 2015-04-23 ENCOUNTER — Encounter: Payer: Managed Care, Other (non HMO) | Admitting: Surgery

## 2015-04-23 ENCOUNTER — Encounter: Payer: Self-pay | Admitting: Sports Medicine

## 2015-04-23 ENCOUNTER — Ambulatory Visit (INDEPENDENT_AMBULATORY_CARE_PROVIDER_SITE_OTHER): Payer: Managed Care, Other (non HMO) | Admitting: Sports Medicine

## 2015-04-23 ENCOUNTER — Ambulatory Visit (INDEPENDENT_AMBULATORY_CARE_PROVIDER_SITE_OTHER): Payer: Managed Care, Other (non HMO)

## 2015-04-23 DIAGNOSIS — M79671 Pain in right foot: Secondary | ICD-10-CM

## 2015-04-23 DIAGNOSIS — Z89421 Acquired absence of other right toe(s): Secondary | ICD-10-CM

## 2015-04-23 DIAGNOSIS — E11621 Type 2 diabetes mellitus with foot ulcer: Secondary | ICD-10-CM | POA: Diagnosis not present

## 2015-04-23 LAB — GLUCOSE, CAPILLARY
Glucose-Capillary: 210 mg/dL — ABNORMAL HIGH (ref 65–99)
Glucose-Capillary: 201 mg/dL — ABNORMAL HIGH (ref 65–99)

## 2015-04-24 NOTE — Progress Notes (Signed)
CIRE, CAUTHON ([id number]) Visit Report for 04/23/2015 HBO Details [name], [name] 04/23/2015 1:30 Patient Name: Date of Service: [name] Medical Record Patient Account Number: [id number] Number: Treating RN: Date of Birth/Sex: 1965-06-06 (50 y.o. Male) Other Clinician: Primary Care Physician: [name] Treating Britto, Errol Referring Physician: [name] Physician/Extender: Suella Grove in Treatment: 15 HBO Treatment Course Details Treatment Course Ordering Physician: Christin Fudge 1 Number: HBO Treatment Start Date: 02/01/2015 Total Treatments 60 Ordered: HBO Indication: Diabetic Ulcer(s) of the Lower Extremity Standard/Conservative Wound Care tried and failed greater than or equal to 30 days Wound #2 Right, Lateral Metatarsal head fifth HBO Treatment Details Treatment Number: 49 Patient Type: Outpatient Chamber Type: Monoplace Chamber Serial #: [id number] Treatment Protocol: 2.0 ATA with 90 minutes oxygen, and no air breaks Treatment Details Compression Rate Down: 1.5 psi / minute De-Compression Rate Up: 1.5 psi / minute Air breaks and breathing Compress Tx Pressure periods Decompress Decompress Begins Reached (leave unused spaces Begins Ends blank) Chamber Pressure (ATA) 1 2 - - - - - - 2 1 Clock Time (24 hr) 13:13 13:23 - - - - - - 14:53 15:03 Treatment Length: 110 (minutes) Treatment Segments: 4 Capillary Blood Glucose Pre Capillary Blood Glucose (mg/dl): Post Capillary Blood Glucose (mg/dl): Vital Signs Capillary Blood Glucose Reference Range: 80 - 120 mg / dl HBO Diabetic Blood Glucose Intervention Range: <131 mg/dl or >249 mg/dl Time Vitals Blood Respiratory Capillary Blood Glucose Pulse Action Type: Pulse: Temperature: Taken: Pressure: Rate: Glucose (mg/dl): Meter #: Oximetry (%) Taken: Pre 12:52 150/69 97 18 98 210 1 none Sunga, Justin J. ([id number]) Post 15:07 142/60 84 18 98.3 201 1 none Treatment Response Treatment  Completion Status: Treatment Completed without Adverse Event HBO Attestation I certify that I supervised this HBO treatment in accordance with Medicare guidelines. A trained Yes emergency response team is readily available per hospital policies and procedures. Continue HBOT as ordered. Yes Electronic Signature(s) Signed: 04/23/2015 4:43:10 PM By: Christin Fudge MD, FACS Entered By: Christin Fudge on 04/23/2015 16:43:10 [name], [name] ([id number]) -------------------------------------------------------------------------------- HBO Safety Checklist Details [name], [name] 04/23/2015 1:30 Patient Name: Date of Service: [name] Medical Record Patient Account Number: [id number] Number: Treating RN: Date of Birth/Sex: Mar 25, 1965 (49 y.o. Male) Other Clinician: Primary Care Physician: [name] Treating Britto, Errol Referring Physician: [name] Physician/Extender: Weeks in Treatment: 15 HBO Safety Checklist Items Safety Checklist Consent Form Signed Patient voided / foley secured and emptied When did you last eato 12:00 pm Last dose of injectable or oral agent 07:00 am NA Ostomy pouch emptied and vented if applicable NA All implantable devices assessed, documented and approved Intravenous access site secured and place Valuables secured Linens and cotton and cotton/polyester blend (less than 51% polyester) Personal oil-based products / skin lotions / body lotions removed NA Wigs or hairpieces removed NA Smoking or tobacco materials removed Books / newspapers / magazines / loose paper removed Cologne, aftershave, perfume and deodorant removed Jewelry removed (may wrap wedding band) NA Make-up removed Hair care products removed Battery operated devices (external) removed Heating patches and chemical warmers removed NA Titanium eyewear removed NA Nail polish cured greater than 10 hours NA Casting material cured greater than 10 hours NA Hearing  aids removed NA Loose dentures or partials removed NA Prosthetics have been removed Patient demonstrates correct use of air break device (if applicable) Patient concerns have been addressed Patient grounding bracelet on and cord attached to chamber Specifics for Inpatients (complete in addition to above) Medication sheet sent with  patient [name], [name] ([id number]) Intravenous medications needed or due during therapy sent with patient Drainage tubes (e.g. nasogastric tube or chest tube secured and vented) Endotracheal or Tracheotomy tube secured Cuff deflated of air and inflated with saline Airway suctioned Electronic Signature(s) Signed: 04/23/2015 4:50:30 PM By: Lorine Bears RCP, RRT, CHT Entered By: Lorine Bears on 04/23/2015 13:19:32

## 2015-04-24 NOTE — Progress Notes (Signed)
Patient ID: [name], male   DOB: 10/08/1965, 50 y.o.   MRN: [id number]  Subjective: [name] is a 50 y.o. 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 Patient has finished PICC Line Abx without complication and has been going to HBO with wound care by Dr. Con [name] with use of prisma at the wound site; Patient denies pain at surgical site, denies calf pain, denies headache, chest pain, shortness of breath, nausea, vomiting, fever, or chills. Patient states that he is doing well and is in NB tennis shoes. No other issues noted.   Patient Active Problem List   Diagnosis Date Noted  . Influenza A 03/09/2015  . Fever, unspecified 03/08/2015  . Nausea and vomiting 03/08/2015  . Hyperlipidemia 03/04/2015  . Osteomyelitis ([place]) 03/04/2015  . Edema leg 02/26/2015  . Pain of right lower leg 02/26/2015  . 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 Diabetes mellitus ([place]) 02/10/2015  . Foot osteomyelitis, right ([place]) 02/04/2015  . Type II diabetes mellitus ([place]) 07/10/2007  . HYPERTENSION 07/10/2007    Current Outpatient Prescriptions on File Prior to Visit  Medication Sig Dispense Refill  . acetaminophen (TYLENOL) 325 MG tablet Take 2 tablets (650 mg total) by mouth every 6 (six) hours as needed for mild pain or headache.    Marland Kitchen aspirin EC 81 MG tablet Take 81 mg by mouth daily.    Marland Kitchen ceFEPIme 2 g in dextrose 5 % 50 mL Inject 2 g into the vein every 8 (eight) hours. Two-week supply then per Dr. Cannon [name] 42 ampule 0  . fluconazole (DIFLUCAN) 200 MG tablet Take 200 mg by mouth daily.    Marland Kitchen glimepiride (AMARYL) 2 MG tablet Take 2 mg by mouth 2 (two) times daily.    Marland Kitchen guaiFENesin-codeine 100-10 MG/5ML syrup     . INVOKANA 300 MG TABS tablet Take 1 tablet (300 mg total) by mouth daily. 30 tablet   . metFORMIN (GLUCOPHAGE) 1000 MG tablet Take 1,000 mg by mouth 2 (two) times daily.     . Multiple Vitamin (MULTIVITAMIN) tablet Take 1 tablet by mouth daily.    . Olmesartan-Amlodipine-HCTZ 40-10-25 MG TABS Take 1 tablet by mouth daily. Reported on 02/04/2015    . omega-3 acid ethyl esters (LOVAZA) 1 g capsule Take 2 g by mouth daily.    . ondansetron (ZOFRAN) 4 MG tablet Take 1 tablet (4 mg total) by mouth every 6 (six) hours as needed for nausea. 20 tablet 0  . oxyCODONE-acetaminophen (ROXICET) 5-325 MG tablet Take 1 tablet by mouth every 6 (six) hours as needed for severe pain. 30 tablet 0  . traMADol (ULTRAM) 50 MG tablet Take 1 tablet (50 mg total) by mouth every 6 (six) hours as needed for moderate pain. 30 tablet 0  . TRESIBA FLEXTOUCH 200 UNIT/ML SOPN Inject 100 Units into the skin daily.    . vancomycin 1,250 mg in sodium chloride 0.9 % 250 mL Inject 1,250 mg into the vein every 12 (twelve) hours. Per home health RN 28 each 0   No current facility-administered medications on file prior to visit.    No Known Allergies  Objective: There were no vitals filed for this visit.  General: No acute distress, AAOx3  Right foot: Incision well healed proximally and distally previous wound at amp site is prematurely healed, no swelling to right forefoot, no erythema, no warmth, no drainage, no signs of  infection noted, Capillary fill time <3 seconds in all remaning digits, gross sensation present via light touch to right foot. No pain or crepitation with range of motion right foot.  No pain with calf compression.   Xrays, Right foot: Consistent with amputation partial 5th ray, minimal soft tissue swelling, no other acute findings.   Assessment and Plan:  Problem List Items Addressed This Visit    None    Visit Diagnoses    Right foot pain    -  Primary    Relevant Orders    DG Foot Complete Right    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HCC)        S/p 5th ray amputatation secondary to acute osteomyelitis, Amputation site prematurely healed        -Patient seen and  evaluated -Xrays reviewed -Amp site right foot prematurely healed -Will defer to Dr. Con [name] for continued care; at this time appears that wound care and HBO treatments are no longer needed because of prematurely healed status. -Recommend good supportive shoes, daily inspection, and protection of amp site -Patient may return to work on May 1st.  -[employer] diabetic shoe order form was completed; office to contact primary care for approval / certification;  Office to arrange shoe fitting and dispensing. -Patient to follow up after diabetic shoes are dispensed or sooner if issues arise;  patient to call office if any issues or problems arise.   [name], DPM

## 2015-04-24 NOTE — Progress Notes (Signed)
DECARI, KOPPLIN ([id number]) Visit Report for 04/23/2015 Chief Complaint Document Details [name], [name] 04/23/2015 12:45 Patient Name: Date of Service: [name] Medical Record Patient Account Number: [id number] Number: Treating RN: Ahmed Prima Date of Birth/Sex: Feb 12, 1965 (50 y.o. Male) Other Clinician: Primary Care Physician: [name] Treating Christin Fudge Referring Physician: [name] Physician/Extender: Weeks in Treatment: 15 Information Obtained from: Patient Chief Complaint Patients presents for treatment of an open diabetic ulcer on the plantar aspect of the right foot which she's had for about 4 weeks Electronic Signature(s) Signed: 04/23/2015 1:05:58 PM By: Christin Fudge MD, FACS Entered By: Christin Fudge on 04/23/2015 13:05:58 [name] ([id number]) -------------------------------------------------------------------------------- HPI Details [name], [name] 04/23/2015 12:45 Patient Name: Date of Service: [name] Medical Record Patient Account Number: [id number] Number: Treating RN: Ahmed Prima Date of Birth/Sex: 14-Aug-1965 (50 y.o. Male) Other Clinician: Primary Care Physician: [name] Treating Christin Fudge Referring Physician: [name] Physician/Extender: Weeks in Treatment: 15 History of Present Illness Location: plantar aspect of the right forefoot Quality: Patient reports No Pain. Severity: Patient states wound are getting worse. Duration: Patient has had the wound for < 4 weeks prior to presenting for treatment Timing: he has minimal discharge from the wound Context: The wound appeared gradually over time Modifying Factors: Other treatment(s) tried include:plan local care but not offloading Associated Signs and Symptoms: Patient reports having:no pain or discharge from the wound. HPI Description: This 50 year old male comes with an ulcerated area on the plantar aspect of the  right foot which she's had for approximately a month. I have known him from a previous visit at [facility] wound center and was treated in the months of April and May 2016 and rapidly healed a left plantar ulcer with a total contact cast. He has been a diabetic for about 16 years and tries to keep active and is fairly compliant with his diabetes management. 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 Past medical history significant for hypertension, hyperlipidemia, and status post appendectomy 1993. He does not smoke or drink alcohol. 01/14/2015 -- the patient had tolerated his total contact cast very well and had no problems and has had no systemic symptoms. However when his total contact cast was cut open he had excessive amount of purulent drainage in spite of being on antibiotics. He had had a recent x-ray done in the ER 12/22/2014 which showed IMPRESSION:No evidence of osseous erosion. Known soft tissue ulceration is not well characterized on radiograph. Scattered vascular calcifications seen. his last hemoglobin A1c in December was 7.3. He has been on Augmentin and doxycycline for the last 2 weeks. 01/21/2015 -- his culture grew rare growth of Pantoea species an MR moderate growth of Candida parapsilosis. it is sensitive to levofloxacin. He has not heard back from the insurance company regarding his hyperbaric oxygen therapy. His MRI has not been done yet and we will try and get him an earlier date 01/28/2015 -- MRI was done last night -- IMPRESSION:1. Soft tissue ulcer overlying the plantar aspect of the fifth metatarsal head extending to the cortex. Subcortical marrow edema in the fifth metatarsal head with corresponding T1 hypointensity is concerning for early osteomyelitis of the plantar lateral aspect of the fifth metatarsal head. Chest x-ray done on 01/14/2015 shows bronchiectatic changes without infiltrate. EKG done on generally 17 2017 shows a normal sinus rhythm and is a  normal EKG. [name], [name] ([id number]) 02/04/2015 -- he was asked to see Dr. [name] last week and had 2 appointments but had  to cancel both due to pressures of work. Last night he has woken up with severe pain in the foot and leg and it is swollen up. No fever or no change in his blood glucose. Addendum: I spoke with Dr. [name] who kindly agreed to accept the patient for inpatient therapy and have also opened to the hospitalist Dr. [name], and discuss details of the management including PICC line and repeat cultures. 02/12/2015-- -- was seen by Dr. [name] in the hospital and a PICC line was placed. He was to receive Ceftazidime 2 g every 12 hourly, oral levofloxacin 750 mg every 24 hourly and oral fluconazole 200 mg daily. The antibiotics were to be given for 4 weeks except the Diflucan was to be given for the first 2 weeks. Reviewed note from 02/10/2015 -- and Dr. [name] had recommended management for growth of MSSA and Serratia. He switched him from ceftazidime to ceftriaxone 2 g every 24 hours. Levofloxacin was stopped and he would continue on fluconazole for another week. He had asked me to decide whether further imaging was necessary and whether surgical debridement of the infected bone was needed. He is doing well and has been off work for this week and we will keep him off the next week. 02/22/2015 -- he was seen by my colleague on 01/19/2015 and at that time an incision and drainage was done on his right lateral forefoot on the dorsum. Today when I probed this wound it is frankly draining pus and it communicates with the ulcer on the plantar aspect of his right foot. The patient is still on IV ceftriaxone 2 g every 24 hours and is to be seen by Dr. [name] on Friday. 03/19/2015 -- On 03/04/2015 I spoke to Dr. Celesta Gentile who saw him in the office today and did an x-ray of his right foot and noted that there was osteomyelitis of the right fifth toe and  metatarsal and a lot of pus draining from the wound. He recommended operative debridement which would probably result in the fifth metatarsal head and toe amputation.The patient would be referred back to [place] once he was done with surgery. He was admitted to [facility] yesterday and had surgery done by podiatry for a right fifth metatarsal acute osteomyelitis with cellulitis and abscess. He had a right foot incision and drainage with fifth metatarsal partial amputation and removal of toe infected bone and soft tissue with cultures. The wound was partially closed and packing of the distal end was done. Patient was already on cefepime 2 g IV every 8 hourly and put on vancomycin pending final cultures. He had grown moderate gram-negative rods, and later found to be rare diphtheroids. I received a call from Dr. Cannon Kettle the podiatrist and we discussed the above. On 03/10/2015 he was found positive for influenza a and has been put on Tamiflu. Since his discharge he has been seen by Dr. Cannon Kettle who is planning to remove his sutures this coming week. He was reviewed by Dr. [name] on 03/17/2015 and his Diflucan was stopped and vancomycin. After the 3 doses he is taking. He is going to change Ceftazidime to Zosyn 3.375 g IV every 8 hours. 03/25/2015 -- he was seen by the podiatrist a couple of days ago and the sutures were removed. She will follow back with him in 4 weeks' time and at that time x-rays will be taken and a custom molded insert would be made for his shoe. 04/01/2015 -- he was seen by Dr.  Fitzgerald on 03/29/2015 who pulled the PICC line stopped his IV antibiotics and recommended starting doxycycline and levofloxacin for 2 weeks. He also stop the fluconazole. The patient will follow up with him only when necessary. Electronic Signature(s) Signed: 04/23/2015 1:06:03 PM By: Christin Fudge MD, FACS Entered By: Christin Fudge on 04/23/2015 13:06:03 [name], [name]  ([id number], SCHRANDT ([id number]) -------------------------------------------------------------------------------- Physical Exam Details [name], [name] 04/23/2015 12:45 Patient Name: Date of Service: [name] Medical Record Patient Account Number: [id number] Number: Treating RN: Ahmed Prima Date of Birth/Sex: 09/25/1965 (50 y.o. Male) Other Clinician: Primary Care Physician: [name] Treating Christin Fudge Referring Physician: [name] Physician/Extender: Weeks in Treatment: 15 Constitutional . Pulse regular. Respirations normal and unlabored. Afebrile. . Eyes Nonicteric. Reactive to light. Ears, Nose, Mouth, and Throat Lips, teeth, and gums WNL.Marland Kitchen Moist mucosa without lesions. Neck supple and nontender. No palpable supraclavicular or cervical adenopathy. Normal sized without goiter. Respiratory WNL. No retractions.. Breath sounds WNL, No rubs, rales, rhonchi, or wheeze.. Cardiovascular Heart rhythm and rate regular, no murmur or gallop.. Pedal Pulses WNL. No clubbing, cyanosis or edema. Lymphatic No adneopathy. No adenopathy. No adenopathy. Musculoskeletal Adexa without tenderness or enlargement.. Digits and nails w/o clubbing, cyanosis, infection, petechiae, ischemia, or inflammatory conditions.. Integumentary (Hair, Skin) No suspicious lesions. No crepitus or fluctuance. No peri-wound warmth or erythema. No masses.Marland Kitchen Psychiatric Judgement and insight Intact.. No evidence of depression, anxiety, or agitation.. Notes the wound is completely healed and there is no evidence of any drainage from the scar tissue. Electronic Signature(s) Signed: 04/23/2015 1:06:31 PM By: Christin Fudge MD, FACS Entered By: Christin Fudge on 04/23/2015 13:06:31 AYLEN, SERRITELLA ([id number]) -------------------------------------------------------------------------------- Physician Orders Details JONNIE, PUNTER 04/23/2015 12:45 Patient Name: Date of  Service: [name] Medical Record Patient Account Number: [id number] Number: Treating RN: Ahmed Prima Date of Birth/Sex: June 16, 1965 (50 y.o. Male) Other Clinician: Primary Care Physician: [name] Treating Christin Fudge Referring Physician: [name] Physician/Extender: Suella [place] in Treatment: 15 Verbal / Phone Orders: Yes Clinician: Carolyne Fiscal, Debi Read Back and Verified: Yes Diagnosis Coding Discharge From [facility] Services Wound #2 Right,Lateral Metatarsal head fifth o Discharge from [place] - discharged from wound care only, please let us know if you need anything, continue HBO Electronic Signature(s) Signed: 04/23/2015 4:56:07 PM By: Christin Fudge MD, FACS Signed: 04/23/2015 5:23:57 PM By: Alric Quan Entered By: Alric Quan on 04/23/2015 13:13:35 [name] -------------------------------------------------------------------------------- Problem List Details [name], [name] 04/23/2015 12:45 Patient Name: Date of Service: [name] Medical Record Patient Account Number: [id number] Number: Treating RN: Ahmed Prima Date of Birth/Sex: 08-31-1965 (50 y.o. 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 Medical Record Patient Account Number: [id number] Number: Treating RN: Ahmed Prima Date of Birth/Sex: July 01, 1965 (50 y.o. Male) Other Clinician: Primary Care Physician: [name] Treating Christin Fudge Referring Physician: Dagmar Hait,  RAVISANKAR Physician/Extender: Weeks in Treatment: 15 Subjective Chief Complaint Information obtained from Patient Patients presents for treatment of an open diabetic ulcer on the plantar aspect of the right foot which she's had for about 4 weeks History of Present Illness (HPI) The following HPI elements were documented for the patient's wound: Location: plantar aspect of the right forefoot Quality: Patient reports No Pain. Severity: Patient states wound are getting worse. Duration: Patient has had the wound for < 4 weeks prior to presenting for treatment Timing: he has minimal discharge from the wound Context: The wound appeared gradually over time Modifying Factors: Other treatment(s) tried include:plan local care but not offloading Associated Signs and Symptoms: Patient reports having:no pain or discharge from the wound. This 50 year old male comes with an ulcerated area on the plantar aspect of the right foot which she's had for approximately a month. I have known him from a previous visit at [facility] wound center and was treated in the months of April and May 2016 and rapidly healed a left plantar ulcer with a total contact cast. He has been a diabetic for about 16 years and tries to keep active and is fairly compliant with his diabetes management. 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 Past medical history significant for hypertension, hyperlipidemia, and status post appendectomy 1993. He does not smoke or drink  alcohol. 01/14/2015 -- the patient had tolerated his total contact cast very well and had no problems and has had no systemic symptoms. However when his total contact cast was cut open he had excessive amount of purulent drainage in spite of being on antibiotics. He had had a recent x-ray done in the ER 12/22/2014 which showed IMPRESSION:No evidence of osseous erosion. Known soft tissue ulceration is not well characterized on radiograph. Scattered vascular calcifications seen. his last hemoglobin A1c in December was 7.3. He has been on Augmentin and doxycycline for the last 2 weeks. 01/21/2015 -- his culture grew rare growth of Pantoea species an MR moderate growth of Candida parapsilosis. it is sensitive to levofloxacin. He has not heard back from the insurance company regarding his hyperbaric oxygen therapy. PRINCETYN, LARICK ([id number]) His MRI has not been done yet and we will try and get him an earlier date 01/28/2015 -- MRI was done last night -- IMPRESSION:1. Soft tissue ulcer overlying the plantar aspect of the fifth metatarsal head extending to the cortex. Subcortical marrow edema in the fifth metatarsal head with corresponding T1 hypointensity is concerning for early osteomyelitis of the plantar lateral aspect of the fifth metatarsal head. Chest x-ray done on 01/14/2015 shows bronchiectatic changes without infiltrate. EKG done on generally 17 2017 shows a normal sinus rhythm and is a normal EKG. 02/04/2015 -- he was asked to see Dr. [name] last week and had 2 appointments but had to cancel both due to pressures of work. Last night he has woken up with severe pain in the foot and leg and it is swollen up. No fever or no change in his blood glucose. Addendum: I spoke with Dr. [name] who kindly agreed to accept the patient for inpatient therapy and have also opened to the hospitalist Dr. [name], and discuss details of the management including PICC line and repeat  cultures. 02/12/2015-- -- was seen by Dr. [name] in the hospital and a PICC line was placed. He was to receive Ceftazidime 2 g every 12 hourly, oral levofloxacin 750 mg every 24 hourly and oral fluconazole 200 mg daily. The antibiotics were to be given for  4 weeks except the Diflucan was to be given for the first 2 weeks. Reviewed note from 02/10/2015 -- and Dr. [name] had recommended management for growth of MSSA and Serratia. He switched him from ceftazidime to ceftriaxone 2 g every 24 hours. Levofloxacin was stopped and he would continue on fluconazole for another week. He had asked me to decide whether further imaging was necessary and whether surgical debridement of the infected bone was needed. He is doing well and has been off work for this week and we will keep him off the next week. 02/22/2015 -- he was seen by my colleague on 01/19/2015 and at that time an incision and drainage was done on his right lateral forefoot on the dorsum. Today when I probed this wound it is frankly draining pus and it communicates with the ulcer on the plantar aspect of his right foot. The patient is still on IV ceftriaxone 2 g every 24 hours and is to be seen by Dr. [name] on Friday. 03/19/2015 -- On 03/04/2015 I spoke to Dr. 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 He recommended operative debridement which would probably result in the fifth metatarsal head and toe amputation.The patient would be referred back to [place] once he was done with surgery. He was admitted to [facility] yesterday and had surgery done by podiatry for a right fifth metatarsal acute osteomyelitis with cellulitis and abscess. He had a right foot incision and drainage with fifth metatarsal partial amputation and removal of toe infected bone and soft tissue with  cultures. The wound was partially closed and packing of the distal end was done. Patient was already on cefepime 2 g IV every 8 hourly and put on vancomycin pending final cultures. He had grown moderate gram-negative rods, and later found to be rare diphtheroids. I received a call from Dr. Cannon Kettle the podiatrist and we discussed the above. On 03/10/2015 he was found positive for influenza a and has been put on Tamiflu. Since his discharge he has been seen by Dr. Cannon Kettle who is planning to remove his sutures this coming week. He was reviewed by Dr. [name] on 03/17/2015 and his Diflucan was stopped and vancomycin. After the 3 doses he is taking. He is going to change Ceftazidime to Zosyn 3.375 g IV every 8 hours. 03/25/2015 -- he was seen by the podiatrist a couple of days ago and the sutures were removed. She will follow back with him in 4 weeks' time and at that time x-rays will be taken and a custom molded insert would be made for his shoe. [name], [name] ([id number]) 04/01/2015 -- he was seen by Dr. [name] on 03/29/2015 who pulled the PICC line stopped his IV antibiotics and recommended starting doxycycline and levofloxacin for 2 weeks. He also stop the fluconazole. The patient will follow up with him only when necessary. Objective Constitutional Pulse regular. Respirations normal and unlabored. Afebrile. Vitals Time Taken: 12:51 PM, Height: 74 in, Weight: 288 lbs, BMI: 37, Temperature: 98.0 F, Pulse: 97 bpm, Respiratory Rate: 18 breaths/min, Blood Pressure: 150/69 mmHg, Capillary Blood Glucose: 210 mg/dl. Eyes Nonicteric. Reactive to light. Ears, Nose, Mouth, and Throat Lips, teeth, and gums WNL.Marland Kitchen Moist mucosa without lesions. Neck supple and nontender. No palpable supraclavicular or cervical adenopathy. Normal sized without goiter. Respiratory WNL. No retractions.. Breath  sounds WNL, No rubs, rales, rhonchi, or wheeze.. Cardiovascular Heart rhythm and rate regular,  no murmur or gallop.. Pedal Pulses WNL. No clubbing, cyanosis or edema. Lymphatic No adneopathy. No adenopathy. No adenopathy. Musculoskeletal Adexa without tenderness or enlargement.. Digits and nails w/o clubbing, cyanosis, infection, petechiae, ischemia, or inflammatory conditions.Marland Kitchen Psychiatric Judgement and insight Intact.. No evidence of depression, anxiety, or agitation.. General Notes: the wound is completely healed and there is no evidence of any drainage from the scar tissue. Integumentary (Hair, Skin) No suspicious lesions. No crepitus or fluctuance. No peri-wound warmth or erythema. No masses.Marland Kitchen DONLD, LUEBBE ([id number]) Wound #2 status is Open. Original cause of wound was Bump. The wound is located on the Right,Lateral Metatarsal head fifth. The wound measures 0.1cm length x 0.1cm width x 0.1cm depth; 0.008cm^2 area and 0.001cm^3 volume. The wound is limited to skin breakdown. There is no tunneling or undermining noted. There is a small amount of serosanguineous drainage noted. The wound margin is flat and intact. There is large (67-100%) red granulation within the wound bed. There is a small (1-33%) amount of necrotic tissue within the wound bed including Adherent Slough. The periwound skin appearance exhibited: Moist, Erythema. The periwound skin appearance did not exhibit: Callus, Crepitus, Excoriation, Fluctuance, Friable, Induration, Localized Edema, Rash, Scarring, Dry/Scaly, Maceration, Atrophie Blanche, Cyanosis, Ecchymosis, Hemosiderin Staining, Mottled, Pallor, Rubor. The surrounding wound skin color is noted with erythema which is circumferential. Periwound temperature was noted as Hot. The periwound has tenderness on palpation. Assessment Active Problems ICD-10 E11.621 - Type 2 diabetes mellitus with foot ulcer L97.512 - 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d L84 - Corns and callosities L02.611 - Cutaneous abscess of  right foot M86.371 - Chronic multifocal osteomyelitis, right ankle and foot Plan Discharge From [facility] Services: Wound #2 Right,Lateral Metatarsal head fifth: Discharge from [place] - discharged from wound care only, please let us know if you need anything, continue HBO the wound is completely healed and the rest of his scar tissue is looking clean and supple. He will complete his course of hyperbaric oxygen therapy and as far as wound care visits goes he can see me back on a when necessary basis. [name], [name] ([id number]) Electronic Signature(s) Signed: 04/23/2015 5:00:32 PM By: Christin Fudge MD, FACS Previous Signature: 04/23/2015 4:59:45 PM Version By: Christin Fudge MD, FACS Previous Signature: 04/23/2015 1:07:01 PM Version By: Christin Fudge MD, FACS Entered By: Christin Fudge on 04/23/2015 17:00:32 [name] -------------------------------------------------------------------------------- SuperBill Details Patient Name: [name] Date of Service: 04/23/2015 Medical Record Number: [id number] Patient Account Number: [id number] Date of Birth/Sex: [birth year] (50 y.o. Male) Treating RN: Ahmed Prima Primary Care Physician: [name] Other Clinician: Referring Physician: [name] Treating Physician/Extender: Frann Rider in Treatment: 15 Diagnosis Coding ICD-10 Codes Code Description E11.621 Type 2 diabetes mellitus with foot ulcer L97.512 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d L84 Corns and callosities L02.611 Cutaneous abscess of right foot M86.371 Chronic multifocal osteomyelitis, right ankle and foot Facility Procedures CPT4 Code: [id number] Description: [id number] - WOUND CARE VISIT-LEV 2 EST PT Modifier: Quantity: 1 Physician Procedures CPT4 Code Description: [id number] - WC PHYS LEVEL 3 - EST PT ICD-10 Description Diagnosis E11.621 Type 2 diabetes mellitus with foot  ulcer L97.512 Non-pressure chronic ulcer of other part of right fo Modifier: ot with fat lay Quantity: 1 er exposed Electronic Signature(s) Signed: 04/23/2015 4:56:07 PM By: Christin Fudge MD, FACS  Signed: 04/23/2015 5:23:57 PM By: Alric Quan Previous Signature: 04/23/2015 1:07:24 PM Version By: Christin Fudge MD, FACS Entered By: Alric Quan on 04/23/2015 13:12:52

## 2015-04-24 NOTE — Progress Notes (Signed)
[name], [name] ([id number]) Visit Report for 04/23/2015 Arrival Information Details [name], [name] 04/23/2015 1:30 Patient Name: Date of Service: [name] Medical Record Patient Account Number: [id number] Number: Treating RN: Date of Birth/Sex: [birth year] (50 y.o. Male) Other Clinician: Primary Care Physician: [name] Treating Britto, Errol Referring Physician: [name] Physician/Extender: Weeks in Treatment: 15 Visit Information History Since Last Visit Added or deleted any medications: No Patient Arrived: Ambulatory Any new allergies or adverse reactions: No Arrival Time: 13:13 Had a fall or experienced change in No Accompanied By: self activities of daily living that may affect Transfer Assistance: None risk of falls: Patient Identification Verified: Yes Signs or symptoms of abuse/neglect since last No Secondary Verification Process Yes visito Completed: Hospitalized since last visit: No Patient Requires Transmission-Based No Pain Present Now: No Precautions: Patient Has Alerts: Yes Patient Alerts: DM II Electronic Signature(s) Signed: 04/23/2015 4:50:30 PM By: Lorine Bears RCP, RRT, CHT Entered By: Lorine Bears on 04/23/2015 13:18:09 [name] ([id number]) -------------------------------------------------------------------------------- Encounter Discharge Information Details [name], [name] 04/23/2015 1:30 Patient Name: Date of Service: [name] Medical Record Patient Account Number: [id number] Number: Treating RN: Date of Birth/Sex: 24-Jan-1965 (50 y.o. Male) Other Clinician: Primary Care Physician: [name] Treating Christin Fudge Referring Physician: [name] Physician/Extender: Weeks in Treatment: 15 Encounter Discharge Information Items Discharge Pain Level: 0 Discharge Condition: Stable Ambulatory Status: Ambulatory Discharge Destination:  Home Private Transportation: Auto Accompanied By: self Schedule Follow-up Appointment: No Medication Reconciliation completed and No provided to Patient/Care [name]: Clinical Summary of Care: Notes Patient has an HBO treatment scheduled on 04/26/15 at 08:00 am. Electronic Signature(s) Signed: 04/23/2015 4:50:30 PM By: Lorine Bears RCP, RRT, CHT Entered By: Lorine Bears on 04/23/2015 15:23:07 [name] ([id number]) -------------------------------------------------------------------------------- Vitals Details [name], [name] 04/23/2015 1:30 Patient Name: Date of Service: [name] Medical Record Patient Account Number: [id number] Number: Treating RN: Date of Birth/Sex: [birth year] (50 y.o. Male) Other Clinician: Primary Care Physician: [name] Treating Britto, Errol Referring Physician: [name] Physician/Extender: Weeks in Treatment: 15 Vital Signs Time Taken: 12:52 Temperature (F): 98.0 Height (in): 74 Pulse (bpm): 97 Weight (lbs): 288 Respiratory Rate (breaths/min): 18 Body Mass Index (BMI): 37 Blood Pressure (mmHg): 150/69 Capillary Blood Glucose (mg/dl): 210 Reference Range: 80 - 120 mg / dl Electronic Signature(s) Signed: 04/23/2015 4:50:30 PM By: Lorine Bears RCP, RRT, CHT Entered By: Lorine Bears on 04/23/2015 13:18:41

## 2015-04-24 NOTE — Progress Notes (Signed)
[name] ([id number]) Visit Report for 04/23/2015 Arrival Information Details Patient Name: [name], [name]. Date of Service: 04/23/2015 12:45 PM Medical Record Number: [id number] Patient Account Number: [id number] Date of Birth/Sex: 05-02-1965 (50 y.o. Male) Treating RN: Ahmed Prima Primary Care Physician: [name] Other Clinician: Referring Physician: [name] Treating Physician/Extender: Frann Rider in Treatment: 15 Visit Information History Since Last Visit All ordered tests and consults were completed: No Patient Arrived: Ambulatory Added or deleted any medications: No Arrival Time: 12:51 Any new allergies or adverse reactions: No Accompanied By: self Had a fall or experienced change in No Transfer Assistance: None activities of daily living that may affect Patient Identification Verified: Yes risk of falls: Secondary Verification Process Yes Signs or symptoms of abuse/neglect since last No Completed: visito Patient Requires Transmission-Based No Hospitalized since last visit: No Precautions: Pain Present Now: No Patient Has Alerts: Yes Patient Alerts: DM II Electronic Signature(s) Signed: 04/23/2015 5:23:57 PM By: Alric Quan Entered By: Alric Quan on 04/23/2015 12:51:30 [name] ([id number]) -------------------------------------------------------------------------------- Clinic Level of Care Assessment Details Patient Name: [name] Date of Service: 04/23/2015 12:45 PM Medical Record Number: [id number] Patient Account Number: [id number] Date of Birth/Sex: 11/19/1965 (50 y.o. Male) Treating RN: Ahmed Prima Primary Care Physician: [name] Other Clinician: Referring Physician: [name] Treating Physician/Extender: Frann Rider in Treatment: 15 Clinic Level of Care Assessment Items TOOL 4 Quantity Score X - Use when only an EandM is performed on FOLLOW-UP visit  1 0 ASSESSMENTS - Nursing Assessment / Reassessment X - Reassessment of Co-morbidities (includes updates in patient status) 1 10 X - Reassessment of Adherence to Treatment Plan 1 5 ASSESSMENTS - Wound and Skin Assessment / Reassessment X - Simple Wound Assessment / Reassessment - one wound 1 5 []  - Complex Wound Assessment / Reassessment - multiple wounds 0 []  - Dermatologic / Skin Assessment (not related to wound area) 0 ASSESSMENTS - Focused Assessment []  - Circumferential Edema Measurements - multi extremities 0 []  - Nutritional Assessment / Counseling / Intervention 0 []  - Lower Extremity Assessment (monofilament, tuning fork, pulses) 0 []  - Peripheral Arterial Disease Assessment (using hand held doppler) 0 ASSESSMENTS - Ostomy and/or Continence Assessment and Care []  - Incontinence Assessment and Management 0 []  - Ostomy Care Assessment and Management (repouching, etc.) 0 PROCESS - Coordination of Care X - Simple Patient / Family Education for ongoing care 1 15 []  - Complex (extensive) Patient / Family Education for ongoing care 0 []  - Staff obtains Programmer, systems, Records, Test Results / Process Orders 0 []  - Staff telephones HHA, Nursing Homes / Clarify orders / etc 0 []  - Routine Transfer to another Facility (non-emergent condition) 0 IOANNIS, SARTI ([id number]) []  - Routine Hospital Admission (non-emergent condition) 0 []  - New Admissions / Biomedical engineer / Ordering NPWT, Apligraf, etc. 0 []  - Emergency Hospital Admission (emergent condition) 0 X - Simple Discharge Coordination 1 10 []  - Complex (extensive) Discharge Coordination 0 PROCESS - Special Needs []  - Pediatric / Minor Patient Management 0 []  - Isolation Patient Management 0 []  - Hearing / Language / Visual special needs 0 []  - Assessment of Community assistance (transportation, D/C planning, etc.) 0 []  - Additional assistance / Altered mentation 0 []  - Support Surface(s) Assessment (bed, cushion, seat,  etc.) 0 INTERVENTIONS - Wound Cleansing / Measurement []  - Simple Wound Cleansing - one wound 0 []  - Complex Wound Cleansing - multiple wounds 0 X - Wound Imaging (photographs - any number of wounds)  1 5 []  - Wound Tracing (instead of photographs) 0 []  - Simple Wound Measurement - one wound 0 []  - Complex Wound Measurement - multiple wounds 0 INTERVENTIONS - Wound Dressings []  - Small Wound Dressing one or multiple wounds 0 []  - Medium Wound Dressing one or multiple wounds 0 []  - Large Wound Dressing one or multiple wounds 0 []  - Application of Medications - topical 0 []  - Application of Medications - injection 0 INTERVENTIONS - Miscellaneous []  - External ear exam 0 ANTWIAN, COLMENARES ([id number]) []  - Specimen Collection (cultures, biopsies, blood, body fluids, etc.) 0 []  - Specimen(s) / Culture(s) sent or taken to Lab for analysis 0 []  - Patient Transfer (multiple staff / Harrel Lemon Lift / Similar devices) 0 []  - Simple Staple / Suture removal (25 or less) 0 []  - Complex Staple / Suture removal (26 or more) 0 []  - Hypo / Hyperglycemic Management (close monitor of Blood Glucose) 0 []  - Ankle / Brachial Index (ABI) - do not check if billed separately 0 X - Vital Signs 1 5 Has the patient been seen at the hospital within the last three years: Yes Total Score: 55 Level Of Care: New/Established - Level 2 Electronic Signature(s) Signed: 04/23/2015 5:23:57 PM By: Alric Quan Entered By: Alric Quan on 04/23/2015 13:12:41 [name] ([id number]) -------------------------------------------------------------------------------- Encounter Discharge Information Details Patient Name: [name] Date of Service: 04/23/2015 12:45 PM Medical Record Number: [id number] Patient Account Number: [id number] Date of Birth/Sex: 1965-11-26 (50 y.o. Male) Treating RN: Ahmed Prima Primary Care Physician: [name] Other Clinician: Referring Physician: [name] Treating Physician/Extender: Frann Rider in Treatment: 15 Encounter Discharge Information Items Discharge Pain Level: 0 Discharge Condition: Stable Ambulatory Status: Ambulatory Other (Note Discharge Destination: Required) Transportation: Other Accompanied By: self Schedule Follow-up Appointment: Yes Medication Reconciliation completed and provided to Patient/Care Yes Haadiya Frogge: Clinical Summary of Care: Electronic Signature(s) Signed: 04/23/2015 5:23:57 PM By: Alric Quan Entered By: Alric Quan on 04/23/2015 13:14:02 [name] ([id number]) -------------------------------------------------------------------------------- Lower Extremity Assessment Details Patient Name: [name] Date of Service: 04/23/2015 12:45 PM Medical Record Number: [id number] Patient Account Number: [id number] Date of Birth/Sex: [birth year] (50 y.o. Male) Treating RN: Ahmed Prima Primary Care Physician: [name] Other Clinician: Referring Physician: [name] Treating Physician/Extender: Frann Rider in Treatment: 15 Vascular Assessment Pulses: Posterior Tibial Dorsalis Pedis Palpable: [Right:Yes] Extremity colors, hair growth, and conditions: Extremity Color: [Right:Normal] Temperature of Extremity: [Right:Warm] Capillary Refill: [Right:< 3 seconds] Toe Nail Assessment Left: Right: Thick: No Discolored: No Deformed: No Improper Length and Hygiene: No Electronic Signature(s) Signed: 04/23/2015 5:23:57 PM By: Alric Quan Entered By: Alric Quan on 04/23/2015 12:53:00 [name] ([id number]) -------------------------------------------------------------------------------- Multi Wound Chart Details Patient Name: [name] Date of Service: 04/23/2015 12:45 PM Medical Record Number: [id number] Patient Account Number: [id number] Date of Birth/Sex: 10-06-1965 (50 y.o. 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] Comorbid History: [2:Hypertension, Type II Diabetes, Neuropathy] [N/A:N/A] Date Acquired: [2:02/19/2015] [N/A:N/A] Weeks of Treatment: [2:9] [N/A:N/A] Wound Status: [2:Open] [N/A:N/A] Measurements L x W x D 0.1x0.1x0.1 [N/A:N/A] (cm) Area (cm) : [  2:0.008] [N/A:N/A] Volume (cm) : [2:0.001] [N/A:N/A] Classification: [2:Grade 3] [N/A:N/A] Wagner Verification: [2:Abscess] [N/A:N/A] Exudate Amount: [2:Small] [N/A:N/A] Exudate Type: [2:Serosanguineous] [N/A:N/A] Exudate Color: [2:red, brown] [N/A:N/A] Wound Margin: [2:Flat and Intact] [N/A:N/A] Granulation Amount: [2:Large (67-100%)] [N/A:N/A] Granulation Quality: [2:Red] [N/A:N/A] Necrotic Amount: [2:Small (1-33%)] [N/A:N/A] Exposed Structures: [2:Fascia: No Fat: No Tendon: No Muscle: No Joint: No Bone: No] [N/A:N/A] Limited to Skin Breakdown Epithelialization: None N/A N/A Periwound Skin Texture: Edema: No N/A N/A Excoriation: No Induration: No Callus: No Crepitus: No Fluctuance: No Friable: No Rash: No Scarring: No Periwound Skin Moist: Yes N/A N/A Moisture: Maceration: No Dry/Scaly: No Periwound Skin Color: Erythema: Yes N/A N/A Atrophie Blanche: No Cyanosis: No Ecchymosis: No Hemosiderin Staining: No Mottled: No Pallor: No Rubor: No Erythema Location: Circumferential N/A N/A Temperature: Hot N/A N/A Tenderness on Yes N/A  N/A Palpation: Wound Preparation: Ulcer Cleansing: N/A N/A Rinsed/Irrigated with Saline Topical Anesthetic Applied: Other: lidocaine 4% Treatment Notes Electronic Signature(s) Signed: 04/23/2015 5:23:57 PM By: Alric Quan Entered By: Alric Quan on 04/23/2015 13:01:18 [name] ([id number]) -------------------------------------------------------------------------------- [place] Details Patient Name: DAMEON, AVANESSIAN. Date of Service: 04/23/2015 12:45 PM Medical Record Number: [id number] Patient Account Number: [id number] Date of Birth/Sex: 02-09-1965 (50 y.o. Male) Treating RN: Ahmed Prima Primary Care Physician: [name] Other Clinician: Referring Physician: [name] Treating Physician/Extender: Frann Rider in Treatment: 15 Active Inactive HBO Nursing Diagnoses: Anxiety related to feelings of confinement associated with the hyperbaric oxygen chamber Anxiety related to knowledge deficit of hyperbaric oxygen therapy and treatment procedures Discomfort related to temperature and humidity changes inside hyperbaric chamber Potential for barotraumas to ears, sinuses, teeth, and lungs or cerebral gas embolism related to changes in atmospheric pressure inside hyperbaric oxygen chamber Potential for oxygen toxicity seizures related to delivery of 100% oxygen at an increased atmospheric pressure Potential for pulmonary oxygen toxicity related to delivery of 100% oxygen at an increased atmospheric pressure Goals: Barotrauma will be prevented during HBO2 Date Initiated: 01/07/2015 Goal Status: Active Patient and/or family will be able to state/discuss factors appropriate to the management of their disease process during treatment Date Initiated: 01/07/2015 Goal Status: Active Patient will tolerate the hyperbaric oxygen therapy treatment Date Initiated: 01/07/2015 Goal Status: Active Patient will tolerate the  internal climate of the chamber Date Initiated: 01/07/2015 Goal Status: Active Patient/caregiver will verbalize understanding of HBO goals, rationale, procedures and potential hazards Date Initiated: 01/07/2015 Goal Status: Active Signs and symptoms of pulmonary oxygen toxicity will be recognized and promptly addressed Date Initiated: 01/07/2015 Goal Status: Active Signs and symptoms of seizure will be recognized and promptly addressed ; seizing patients will suffer no harm Date Initiated: 01/07/2015 [name] ([id number]) Goal Status: Active Interventions: Administer a five (5) minute air break for patient if signs and symptoms of seizure appear and notify the hyperbaric physician Administer a ten (10) minute air break for patient if signs and symptoms of seizure appear and notify the hyperbaric physician Administer decongestants, per physician orders, prior to HBO2 Administer the correct therapeutic gas delivery based on the patients needs and limitations, per physician order Assess and provide for patientos comfort related to the hyperbaric environment and equalization of middle ear Assess for signs and symptoms related to adverse events, including but not limited to confinement anxiety, pneumothorax, oxygen toxicity and baurotrauma Assess patient for any history of confinement anxiety Assess patient's knowledge and expectations regarding hyperbaric medicine and provide education related to the hyperbaric environment, goals of treatment and prevention of adverse events Implement protocols  to decrease risk of pneumothorax in high risk patients Notes: Orientation to the Wound Care Program Nursing Diagnoses: Knowledge deficit related to the wound healing center program Goals: Patient/caregiver will verbalize understanding of the [facility] Program Date Initiated: 01/07/2015 Goal Status: Active Interventions: Provide education on orientation to the wound  center Notes: Peripheral Neuropathy Nursing Diagnoses: Knowledge deficit related to disease process and management of peripheral neurovascular dysfunction Potential alteration in peripheral tissue perfusion (select prior to confirmation of diagnosis) Goals: Patient/caregiver will verbalize understanding of disease process and disease management Date Initiated: 01/07/2015 Goal Status: Active ARCHIMEDES, TASSINARI ([id number]) Interventions: Assess signs and symptoms of neuropathy upon admission and as needed Provide education on Management of Neuropathy and Related Ulcers Provide education on Management of Neuropathy upon discharge from the [facility] for HBO Treatment Activities: Consult for HBO : 04/23/2015 Patient referred for customized footwear/offloading : 04/23/2015 Notes: Wound/Skin Impairment Nursing Diagnoses: Impaired tissue integrity Knowledge deficit related to ulceration/compromised skin integrity Goals: Patient/caregiver will verbalize understanding of skin care regimen Date Initiated: 01/07/2015 Goal Status: Active Ulcer/skin breakdown will have a volume reduction of 30% by week 4 Date Initiated: 01/07/2015 Goal Status: Active Ulcer/skin breakdown will have a volume reduction of 50% by week 8 Date Initiated: 01/07/2015 Goal Status: Active Ulcer/skin breakdown will have a volume reduction of 80% by week 12 Date Initiated: 01/07/2015 Goal Status: Active Ulcer/skin breakdown will heal within 14 weeks Date Initiated: 01/07/2015 Goal Status: Active Interventions: Assess patient/caregiver ability to obtain necessary supplies Assess patient/caregiver ability to perform ulcer/skin care regimen upon admission and as needed Assess ulceration(s) every visit Provide education on ulcer and skin care Treatment Activities: Referred to DME Eryanna Regal for dressing supplies : 04/23/2015 Skin care regimen initiated : 04/23/2015 PANAYOTIS, DECICCO ([id number]) Topical wound  management initiated : 04/23/2015 Notes: Electronic Signature(s) Signed: 04/23/2015 5:23:57 PM By: Alric Quan Entered By: Alric Quan on 04/23/2015 13:01:13 [name] ([id number]) -------------------------------------------------------------------------------- Pain Assessment Details Patient Name: [name] Date of Service: 04/23/2015 12:45 PM Medical Record Number: [id number] Patient Account Number: [id number] Date of Birth/Sex: [birth year] (50 y.o. Male) Treating RN: Ahmed Prima Primary Care Physician: [name] Other Clinician: Referring Physician: [name] Treating Physician/Extender: Frann Rider in Treatment: 15 Active Problems Location of Pain Severity and Description of Pain Patient Has Paino No Site Locations Pain Management and Medication Current Pain Management: Electronic Signature(s) Signed: 04/23/2015 5:23:57 PM By: Alric Quan Entered By: Alric Quan on 04/23/2015 12:51:36 [name] ([id number]) -------------------------------------------------------------------------------- Patient/Caregiver Education Details Patient Name: [name] Date of Service: 04/23/2015 12:45 PM Medical Record Number: [id number] Patient Account Number: [id number] Date of Birth/Gender: Oct 15, 1965 (50 y.o. Male) Treating RN: Ahmed Prima Primary Care Physician: [name] Other Clinician: Referring Physician: [name] Treating Physician/Extender: Frann Rider in Treatment: 15 Education Assessment Education Provided To: Patient Education Topics Provided Wound/Skin Impairment: Handouts: Other: discharged from wound care only, please let us know if you need anything, continue HBO Electronic Signature(s) Signed: 04/23/2015 5:23:57 PM By: Alric Quan Entered By: Alric Quan on 04/23/2015 13:14:13 [name]  ([id number]) -------------------------------------------------------------------------------- Wound Assessment Details Patient Name: [name] Date of Service: 04/23/2015 12:45 PM Medical Record Number: [id number] Patient Account Number: [id number] Date of Birth/Sex: 1965-06-03 (50 y.o. Male) Treating RN: Ahmed Prima Primary Care Physician: [name] Other Clinician: Referring Physician: [name] Treating Physician/Extender: Frann Rider in Treatment: 15 Wound Status Wound Number: 2 Primary Diabetic Wound/Ulcer of the Lower Etiology: Extremity Wound Location: Right Metatarsal  head fifth - Lateral Wound Status: Open Wounding Event: Bump Comorbid Hypertension, Type II Diabetes, History: Neuropathy Date Acquired: 02/19/2015 Weeks Of Treatment: 9 Clustered Wound: No Photos Photo Uploaded By: Alric Quan on 04/23/2015 17:01:49 Wound Measurements Length: (cm) 0.1 Width: (cm) 0.1 Depth: (cm) 0.1 Area: (cm) 0.008 Volume: (cm) 0.001 % Reduction in Area: % Reduction in Volume: Epithelialization: None Tunneling: No Undermining: No Wound Description Classification: Grade 3 Wagner Verification: Abscess Wound Margin: Flat and Intact Exudate Amount: Small Exudate Type: Serosanguineous Exudate Color: red, brown Foul Odor After Cleansing: No Wound Bed Granulation Amount: Large (67-100%) Exposed Structure Granulation Quality: Red Fascia Exposed: No DASHONE, BIXLER ([id number]) Necrotic Amount: Small (1-33%) Fat Layer Exposed: No Necrotic Quality: Adherent Slough Tendon Exposed: No Muscle Exposed: No Joint Exposed: No Bone Exposed: No Limited to Skin Breakdown Periwound Skin Texture Texture Color No Abnormalities Noted: No No Abnormalities Noted: No Callus: No Atrophie Blanche: No Crepitus: No Cyanosis: No Excoriation: No Ecchymosis: No Fluctuance: No Erythema: Yes Friable: No Erythema Location:  Circumferential Induration: No Hemosiderin Staining: No Localized Edema: No Mottled: No Rash: No Pallor: No Scarring: No Rubor: No Moisture Temperature / Pain No Abnormalities Noted: No Temperature: Hot Dry / Scaly: No Tenderness on Palpation: Yes Maceration: No Moist: Yes Wound Preparation Ulcer Cleansing: Rinsed/Irrigated with Saline Topical Anesthetic Applied: Other: lidocaine 4%, Electronic Signature(s) Signed: 04/23/2015 5:23:57 PM By: Alric Quan Entered By: Alric Quan on 04/23/2015 12:56:21 [name] ([id number]) -------------------------------------------------------------------------------- Vitals Details Patient Name: [name] Date of Service: 04/23/2015 12:45 PM Medical Record Number: [id number] Patient Account Number: [id number] Date of Birth/Sex: 1965/07/15 (50 y.o. Male) Treating RN: Ahmed Prima Primary Care Physician: [name] Other Clinician: Referring Physician: [name] Treating Physician/Extender: Frann Rider in Treatment: 15 Vital Signs Time Taken: 12:51 Temperature (F): 98.0 Height (in): 74 Pulse (bpm): 97 Weight (lbs): 288 Respiratory Rate (breaths/min): 18 Body Mass Index (BMI): 37 Blood Pressure (mmHg): 150/69 Capillary Blood Glucose (mg/dl): 210 Reference Range: 80 - 120 mg / dl Electronic Signature(s) Signed: 04/23/2015 5:23:57 PM By: Alric Quan Entered By: Alric Quan on 04/23/2015 12:52:42

## 2015-04-26 ENCOUNTER — Encounter: Payer: Managed Care, Other (non HMO) | Admitting: Surgery

## 2015-04-26 DIAGNOSIS — E11621 Type 2 diabetes mellitus with foot ulcer: Secondary | ICD-10-CM | POA: Diagnosis not present

## 2015-04-26 LAB — GLUCOSE, CAPILLARY
Glucose-Capillary: 227 mg/dL — ABNORMAL HIGH (ref 65–99)
Glucose-Capillary: 205 mg/dL — ABNORMAL HIGH (ref 65–99)

## 2015-04-26 NOTE — Progress Notes (Signed)
[name], [name] ([id number]) Visit Report for 04/26/2015 Arrival Information Details [name], [name] 04/26/2015 8:00 Patient Name: Date of Service: [name] Medical Record Patient Account Number: [id number] Number: Treating RN: Date of Birth/Sex: Jul 07, 1965 (50 y.o. Male) Other Clinician: [name] Primary Care Physician: [name] Treating Christin Fudge Referring Physician: [name] Physician/Extender: Suella Grove in Treatment: 15 Visit Information History Since Last Visit Added or deleted any medications: No Patient Arrived: Ambulatory Any new allergies or adverse reactions: No Arrival Time: 07:50 Signs or symptoms of abuse/neglect since last No Accompanied By: self visito Transfer Assistance: None Hospitalized since last visit: No Patient Identification Verified: Yes Pain Present Now: No Secondary Verification Process Yes Completed: Patient Requires Transmission-Based No Precautions: Patient Has Alerts: Yes Patient Alerts: DM II Electronic Signature(s) Signed: 04/26/2015 12:02:48 PM By: Lorine Bears RCP, RRT, CHT Entered By: Becky Sax, Amado Nash on 04/26/2015 09:02:00 [name] ([id number]) -------------------------------------------------------------------------------- Encounter Discharge Information Details [name], [name] 04/26/2015 8:00 Patient Name: Date of Service: [name] Medical Record Patient Account Number: [id number] Number: Treating RN: Date of Birth/Sex: [birth year] (50 y.o. Male) Other Clinician: [name] Primary Care Physician: [name] Treating Christin Fudge Referring Physician: [name] Physician/Extender: Weeks in Treatment: 15 Encounter Discharge Information Items Discharge Pain Level: 0 Discharge Condition: Stable Ambulatory Status: Ambulatory Discharge Destination: Home Private Transportation: Auto Accompanied By: self Schedule Follow-up  Appointment: No Medication Reconciliation completed and No provided to Patient/Care Rainie Crenshaw: Clinical Summary of Care: Notes Patient has an HBO treatment scheduled on 04/27/15 at 08:00 [name]. Electronic Signature(s) Signed: 04/26/2015 12:02:48 PM By: Lorine Bears RCP, RRT, CHT Entered By: Lorine Bears on 04/26/2015 10:28:12 [name] ([id number]) -------------------------------------------------------------------------------- Vitals Details [name], [name] 04/26/2015 8:00 Patient Name: Date of Service: [name] Medical Record Patient Account Number: [id number] Number: Treating RN: Date of Birth/Sex: May 25, 1965 (50 y.o. Male) Other Clinician: [name] Primary Care Physician: [name] Treating [name], [name] Referring Physician: [name] Physician/Extender: Weeks in Treatment: 15 Vital Signs Time Taken: 07:54 Temperature (F): 98.4 Height (in): 74 Pulse (bpm): 96 Weight (lbs): 288 Respiratory Rate (breaths/min): 18 Body Mass Index (BMI): 37 Blood Pressure (mmHg): 164/84 Capillary Blood Glucose (mg/dl): 227 Reference Range: 80 - 120 mg / dl Electronic Signature(s) Signed: 04/26/2015 12:02:48 PM By: Lorine Bears RCP, RRT, CHT Entered By: Lorine Bears on 04/26/2015 09:02:36

## 2015-04-27 ENCOUNTER — Encounter: Payer: Managed Care, Other (non HMO) | Admitting: Internal Medicine

## 2015-04-27 DIAGNOSIS — E11621 Type 2 diabetes mellitus with foot ulcer: Secondary | ICD-10-CM | POA: Diagnosis not present

## 2015-04-27 LAB — GLUCOSE, CAPILLARY
Glucose-Capillary: 183 mg/dL — ABNORMAL HIGH (ref 65–99)
Glucose-Capillary: 215 mg/dL — ABNORMAL HIGH (ref 65–99)

## 2015-04-27 NOTE — Progress Notes (Signed)
ADO, GORELIK ([id number]) Visit Report for 04/27/2015 Arrival Information Details [name] Date of Service: 04/27/2015 8:00 AM Patient Name: J. Patient Account Number: [id number] Medical Record Treating RN: [id number] Number: Other Clinician: [name] Date of Birth/Sex: May 10, 1965 (50 y.o. Male) Treating [name], [name] Primary Care Physician/Extender: Floyde Parkins Physician: Referring Physician: Sharilyn Sites in Treatment: 15 Visit Information History Since Last Visit Added or deleted any medications: No Patient Arrived: Ambulatory Any new allergies or adverse reactions: No Arrival Time: 07:55 Had a fall or experienced change in No Accompanied By: self activities of daily living that may affect Transfer Assistance: None risk of falls: Patient Identification Verified: Yes Signs or symptoms of abuse/neglect since last No Secondary Verification Process Yes visito Completed: Hospitalized since last visit: No Patient Requires Transmission-Based No Pain Present Now: No Precautions: Patient Has Alerts: Yes Patient Alerts: DM II Electronic Signature(s) Signed: 04/27/2015 4:12:22 PM By: Lorine Bears RCP, RRT, CHT Entered By: Becky Sax, Amado Nash on 04/27/2015 08:07:16 [name] ([id number]) -------------------------------------------------------------------------------- Vitals Details [name] Date of Service: 04/27/2015 8:00 AM Patient Name: J. Patient Account Number: [id number] Medical Record Treating RN: [id number] Number: Other Clinician: [name] Date of Birth/Sex: 18-Sep-1965 (50 y.o. Male) Treating [name], [name] Primary Care Physician/Extender: Floyde Parkins Physician: Referring Physician: Sharilyn Sites in Treatment: 15 Vital Signs Time Taken: 07:57 Temperature (F): 98.7 Height (in): 74 Pulse (bpm): 96 Weight (lbs): 288 Respiratory Rate (breaths/min):  18 Body Mass Index (BMI): 37 Blood Pressure (mmHg): 152/80 Capillary Blood Glucose (mg/dl): 183 Reference Range: 80 - 120 mg / dl Electronic Signature(s) Signed: 04/27/2015 4:12:22 PM By: Lorine Bears RCP, RRT, CHT Entered By: Becky Sax, Amado Nash on 04/27/2015 08:07:47

## 2015-04-28 ENCOUNTER — Encounter: Payer: Managed Care, Other (non HMO) | Admitting: Internal Medicine

## 2015-04-28 DIAGNOSIS — E11621 Type 2 diabetes mellitus with foot ulcer: Secondary | ICD-10-CM | POA: Diagnosis not present

## 2015-04-28 LAB — GLUCOSE, CAPILLARY
Glucose-Capillary: 161 mg/dL — ABNORMAL HIGH (ref 65–99)
Glucose-Capillary: 141 mg/dL — ABNORMAL HIGH (ref 65–99)

## 2015-04-28 NOTE — Progress Notes (Signed)
TILL, LOUPE ([id number]) Visit Report for 04/28/2015 Arrival Information Details [name] Date of Service: 04/28/2015 8:00 AM Patient Name: J. Patient Account Number: [id number] Medical Record Treating RN: [id number] Number: Other Clinician: [name] Date of Birth/Sex: 06-11-1965 (50 y.o. Male) Treating [name], [name] Primary Care Physician/Extender: Floyde Parkins Physician: Referring Physician: Sharilyn Sites in Treatment: 15 Visit Information History Since Last Visit Added or deleted any medications: No Patient Arrived: Ambulatory Any new allergies or adverse reactions: No Arrival Time: 07:50 Had a fall or experienced change in No Accompanied By: self activities of daily living that may affect Transfer Assistance: None risk of falls: Patient Identification Verified: Yes Signs or symptoms of abuse/neglect since last No Secondary Verification Process Yes visito Completed: Hospitalized since last visit: No Patient Requires Transmission-Based No Pain Present Now: No Precautions: Patient Has Alerts: Yes Patient Alerts: DM II Electronic Signature(s) Signed: 04/28/2015 3:12:41 PM By: Lorine Bears RCP, RRT, CHT Entered By: Becky Sax, Amado Nash on 04/28/2015 08:16:05 [name] -------------------------------------------------------------------------------- Encounter Discharge Information Details [name] Date of Service: 04/28/2015 8:00 AM Patient Name: J. Patient Account Number: [id number] Medical Record Treating RN: [id number] Number: Other Clinician: [name] Date of Birth/Sex: 01-25-1965 (50 y.o. Male) Treating [name], [name] Primary Care Physician/Extender: Floyde Parkins Physician: Referring Physician: Sharilyn Sites in Treatment: 15 Encounter Discharge Information Items Discharge Pain Level: 0 Discharge Condition: Stable Ambulatory Status:  Ambulatory Discharge Destination: Home Private Transportation: Auto Accompanied By: self Schedule Follow-up Appointment: No Medication Reconciliation completed and No provided to Patient/Care [name]: Clinical Summary of Care: Notes Patient has an HBO treatment scheduled on 04/29/15 @ 08:00 am. Electronic Signature(s) Signed: 04/28/2015 3:12:41 PM By: Lorine Bears RCP, RRT, CHT Entered By: Becky Sax, Amado Nash on 04/28/2015 10:23:54 [name] -------------------------------------------------------------------------------- Vitals Details [name] Date of Service: 04/28/2015 8:00 AM Patient Name: J. Patient Account Number: [id number] Medical Record Treating RN: [id number] Number: Other Clinician: [name] Date of Birth/Sex: 05-05-1965 (50 y.o. Male) Treating [name], [name] Primary Care Physician/Extender: Floyde Parkins Physician: Referring Physician: Sharilyn Sites in Treatment: 15 Vital Signs Time Taken: 07:57 Temperature (F): 98.6 Height (in): 74 Pulse (bpm): 84 Weight (lbs): 288 Respiratory Rate (breaths/min): 18 Body Mass Index (BMI): 37 Blood Pressure (mmHg): 144/68 Capillary Blood Glucose (mg/dl): 161 Reference Range: 80 - 120 mg / dl Electronic Signature(s) Signed: 04/28/2015 3:12:41 PM By: Lorine Bears RCP, RRT, CHT Entered By: Becky Sax, Amado Nash on 04/28/2015 08:15:47

## 2015-04-29 ENCOUNTER — Encounter: Payer: Managed Care, Other (non HMO) | Admitting: Surgery

## 2015-04-29 DIAGNOSIS — E11621 Type 2 diabetes mellitus with foot ulcer: Secondary | ICD-10-CM | POA: Diagnosis not present

## 2015-04-29 LAB — GLUCOSE, CAPILLARY
Glucose-Capillary: 153 mg/dL — ABNORMAL HIGH (ref 65–99)
Glucose-Capillary: 100 mg/dL — ABNORMAL HIGH (ref 65–99)

## 2015-04-29 NOTE — Progress Notes (Signed)
[name], [name] ([id number]) Visit Report for 04/29/2015 Arrival Information Details [name], [name] 04/29/2015 8:00 Patient Name: Date of Service: [name] Medical Record Patient Account Number: [id number] Number: Treating RN: Date of Birth/Sex: Aug 17, 1965 (50 y.o. Male) Other Clinician: [name] Primary Care Physician: [name] Treating Britto, Errol Referring Physician: [name] Physician/Extender: Suella Grove in Treatment: 16 Visit Information History Since Last Visit Added or deleted any medications: No Patient Arrived: Ambulatory Any new allergies or adverse reactions: No Arrival Time: 07:47 Had a fall or experienced change in No Accompanied By: self activities of daily living that may affect Transfer Assistance: None risk of falls: Patient Identification Verified: Yes Signs or symptoms of abuse/neglect since last No Secondary Verification Process Yes visito Completed: Hospitalized since last visit: No Patient Requires Transmission-Based No Pain Present Now: No Precautions: Patient Has Alerts: Yes Patient Alerts: DM II Electronic Signature(s) Signed: 04/29/2015 11:03:23 AM By: Lorine Bears RCP, RRT, CHT Entered By: Becky Sax, Amado Nash on 04/29/2015 07:51:20 [name] ([id number]) -------------------------------------------------------------------------------- Encounter Discharge Information Details [name], [name] 04/29/2015 8:00 Patient Name: Date of Service: [name] Medical Record Patient Account Number: [id number] Number: Treating RN: Date of Birth/Sex: 01-Apr-1965 (50 y.o. Male) Other Clinician: [name] Primary Care Physician: [name] Treating Christin Fudge Referring Physician: [name] Physician/Extender: Weeks in Treatment: 16 Encounter Discharge Information Items Discharge Pain Level: 0 Discharge Condition: Stable Ambulatory Status:  Ambulatory Discharge Destination: Home Private Transportation: Auto Accompanied By: self Schedule Follow-up Appointment: No Medication Reconciliation completed and No provided to Patient/Care Vlada Uriostegui: Clinical Summary of Care: Notes Patient has an HBO treatment scheduled on 04/30/15 at 08:00 am. Electronic Signature(s) Signed: 04/29/2015 11:03:23 AM By: Lorine Bears RCP, RRT, CHT Entered By: Becky Sax, Amado Nash on 04/29/2015 10:19:55 [name] ([id number]) -------------------------------------------------------------------------------- Vitals Details [name], [name] 04/29/2015 8:00 Patient Name: Date of Service: [name] Medical Record Patient Account Number: [id number] Number: Treating RN: Date of Birth/Sex: Jul 18, 1965 (50 y.o. Male) Other Clinician: [name] Primary Care Physician: [name] Treating Britto, Errol Referring Physician: [name] Physician/Extender: Weeks in Treatment: 16 Vital Signs Time Taken: 07:47 Temperature (F): 98.5 Height (in): 74 Pulse (bpm): 90 Weight (lbs): 288 Respiratory Rate (breaths/min): 18 Body Mass Index (BMI): 37 Blood Pressure (mmHg): 144/68 Capillary Blood Glucose (mg/dl): 153 Reference Range: 80 - 120 mg / dl Electronic Signature(s) Signed: 04/29/2015 11:03:23 AM By: Lorine Bears RCP, RRT, CHT Entered By: Becky Sax, Amado Nash on 04/29/2015 07:51:45

## 2015-04-29 NOTE — Progress Notes (Signed)
[name], [name] ([id number]) Visit Report for 04/29/2015 HBO Details ARL, VANDORN 04/29/2015 8:00 Patient Name: Date of Service: [name] Medical Record Patient Account Number: [id number] Number: Treating RN: Date of Birth/Sex: 1965-03-06 (50 y.o. Male) Other Clinician: [name] Primary Care Physician: [name] Treating [name], [name] Referring Physician: [name] Physician/Extender: Suella Grove in Treatment: 16 HBO Treatment Course Details Treatment Course Ordering Physician: Christin Fudge 1 Number: HBO Treatment Start Date: 02/01/2015 Total Treatments 60 Ordered: HBO Indication: Diabetic Ulcer(s) of the Lower Extremity Standard/Conservative Wound Care tried and failed greater than or equal to 30 days Wound #2 Right, Lateral Metatarsal head fifth HBO Treatment Details Treatment Number: 53 Patient Type: Outpatient Chamber Type: Monoplace Chamber Serial #: [id number] Treatment Protocol: 2.0 ATA with 90 minutes oxygen, and no air breaks Treatment Details Compression Rate Down: 1.5 psi / minute De-Compression Rate Up: 1.5 psi / minute Air breaks and breathing Compress Tx Pressure periods Decompress Decompress Begins Reached (leave unused spaces Begins Ends blank) Chamber Pressure (ATA) 1 2 - - - - - - 2 1 Clock Time (24 hr) 07:56 08:06 - - - - - - 09:36 09:46 Treatment Length: 110 (minutes) Treatment Segments: 4 Capillary Blood Glucose Pre Capillary Blood Glucose (mg/dl): Post Capillary Blood Glucose (mg/dl): Vital Signs Capillary Blood Glucose Reference Range: 80 - 120 mg / dl HBO Diabetic Blood Glucose Intervention Range: <131 mg/dl or >249 mg/dl Time Vitals Blood Respiratory Capillary Blood Glucose Pulse Action Type: Pulse: Temperature: Taken: Pressure: Rate: Glucose (mg/dl): Meter #: Oximetry (%) Taken: Pre 07:47 144/68 90 18 98.5 153 1 none [name], [name] J. ([id number]) Post 09:53 142/76 84 18 98.1 100 1 none Treatment  Response Treatment Completion Status: Treatment Completed without Adverse Event HBO Attestation I certify that I supervised this HBO treatment in accordance with Medicare guidelines. A trained Yes emergency response team is readily available per hospital policies and procedures. Continue HBOT as ordered. Yes Electronic Signature(s) Signed: 04/29/2015 11:38:44 AM By: Christin Fudge MD, FACS Previous Signature: 04/29/2015 11:03:23 AM Version By: Lorine Bears RCP, RRT, CHT Entered By: Christin Fudge on 04/29/2015 11:38:44 [name] ([id number]) -------------------------------------------------------------------------------- HBO Safety Checklist Details [name], [name] 04/29/2015 8:00 Patient Name: Date of Service: [name] Medical Record Patient Account Number: [id number] Number: Treating RN: Date of Birth/Sex: 07-21-1965 (50 y.o. Male) Other Clinician: [name] Primary Care Physician: [name] Treating [name], [name] Referring Physician: [name] Physician/Extender: Suella Grove in Treatment: 16 HBO Safety Checklist Items Safety Checklist Consent Form Signed Patient voided / foley secured and emptied When did you last eato 06:45 am Last dose of injectable or oral agent 07:00 am NA Ostomy pouch emptied and vented if applicable NA All implantable devices assessed, documented and approved Intravenous access site secured and place Valuables secured Linens and cotton and cotton/polyester blend (less than 51% polyester) Personal oil-based products / skin lotions / body lotions removed NA Wigs or hairpieces removed NA Smoking or tobacco materials removed Books / newspapers / magazines / loose paper removed Cologne, aftershave, perfume and deodorant removed Jewelry removed (may wrap wedding band) NA Make-up removed Hair care products removed Battery operated devices (external) removed Heating patches and chemical warmers  removed NA Titanium eyewear removed NA Nail polish cured greater than 10 hours NA Casting material cured greater than 10 hours NA Hearing aids removed NA Loose dentures or partials removed NA Prosthetics have been removed Patient demonstrates correct use of air break device (if applicable) Patient concerns have been addressed Patient grounding bracelet on  and cord attached to chamber Specifics for Inpatients (complete in addition to above) Medication sheet sent with patient [name], [name] ([id number]) Intravenous medications needed or due during therapy sent with patient Drainage tubes (e.g. nasogastric tube or chest tube secured and vented) Endotracheal or Tracheotomy tube secured Cuff deflated of air and inflated with saline Airway suctioned Electronic Signature(s) Signed: 04/29/2015 11:03:23 AM By: Lorine Bears RCP, RRT, CHT Entered By: Lorine Bears on 04/29/2015 07:52:36

## 2015-04-29 NOTE — Progress Notes (Signed)
[name], [name] ([id number]) Visit Report for 04/26/2015 HBO Details [name], [name] 04/26/2015 8:00 Patient Name: Date of Service: [name] Medical Record Patient Account Number: [id number] Number: Treating RN: Date of Birth/Sex: 27-Jan-1965 (50 y.o. Male) Other Clinician: [name] Primary Care Physician: [name] Treating Britto, Errol Referring Physician: [name] Physician/Extender: Suella Grove in Treatment: 15 HBO Treatment Course Details Treatment Course Ordering Physician: Christin Fudge 1 Number: HBO Treatment Start Date: 02/01/2015 Total Treatments 60 Ordered: HBO Indication: Diabetic Ulcer(s) of the Lower Extremity Standard/Conservative Wound Care tried and failed greater than or equal to 30 days Wound #2 Right, Lateral Metatarsal head fifth HBO Treatment Details Treatment Number: 50 Patient Type: Outpatient Chamber Type: Monoplace Chamber Serial #: [id number] Treatment Protocol: 2.0 ATA with 90 minutes oxygen, and no air breaks Treatment Details Compression Rate Down: 1.5 psi / minute De-Compression Rate Up: 1.5 psi / minute Air breaks and breathing Compress Tx Pressure periods Decompress Decompress Begins Reached (leave unused spaces Begins Ends blank) Chamber Pressure (ATA) 1 2 - - - - - - 2 1 Clock Time (24 hr) 08:04 08:14 - - - - - - 09:45 09:58 Treatment Length: 114 (minutes) Treatment Segments: 4 Capillary Blood Glucose Pre Capillary Blood Glucose (mg/dl): Post Capillary Blood Glucose (mg/dl): Vital Signs Capillary Blood Glucose Reference Range: 80 - 120 mg / dl HBO Diabetic Blood Glucose Intervention Range: <131 mg/dl or >249 mg/dl Time Vitals Blood Respiratory Capillary Blood Glucose Pulse Action Type: Pulse: Temperature: Taken: Pressure: Rate: Glucose (mg/dl): Meter #: Oximetry (%) Taken: Pre 07:54 164/84 96 18 98.4 227 1 none [name], [name] J. ([id number]) Post 10:00 152/70 78 18 98.4 205 1 none Treatment  Response Treatment Completion Status: Treatment Completed without Adverse Event HBO Attestation I certify that I supervised this HBO treatment in accordance with Medicare guidelines. A trained Yes emergency response team is readily available per hospital policies and procedures. Continue HBOT as ordered. Yes Electronic Signature(s) Signed: 04/26/2015 11:17:35 AM By: Christin Fudge MD, FACS Entered By: Christin Fudge on 04/26/2015 11:17:35 [name] ([id number]) -------------------------------------------------------------------------------- HBO Safety Checklist Details ELREY, CAPOZZA 04/26/2015 8:00 Patient Name: Date of Service: [name] Medical Record Patient Account Number: [id number] Number: Treating RN: Date of Birth/Sex: Jul 28, 1965 (50 y.o. Male) Other Clinician: [name] Primary Care Physician: [name] Treating Britto, Errol Referring Physician: [name] Physician/Extender: Weeks in Treatment: 15 HBO Safety Checklist Items Safety Checklist Consent Form Signed Patient voided / foley secured and emptied When did you last eato 06:45 am Last dose of injectable or oral agent 07:00 am NA Ostomy pouch emptied and vented if applicable NA All implantable devices assessed, documented and approved Intravenous access site secured and place Valuables secured Linens and cotton and cotton/polyester blend (less than 51% polyester) Personal oil-based products / skin lotions / body lotions removed NA Wigs or hairpieces removed NA Smoking or tobacco materials removed Books / newspapers / magazines / loose paper removed Cologne, aftershave, perfume and deodorant removed Jewelry removed (may wrap wedding band) NA Make-up removed Hair care products removed Battery operated devices (external) removed Heating patches and chemical warmers removed NA Titanium eyewear removed NA Nail polish cured greater than 10 hours NA Casting material cured  greater than 10 hours NA Hearing aids removed NA Loose dentures or partials removed NA Prosthetics have been removed Patient demonstrates correct use of air break device (if applicable) Patient concerns have been addressed Patient grounding bracelet on and cord attached to chamber Specifics for Inpatients (complete in addition to above)  Medication sheet sent with patient KJ, COENEN ([id number]) Intravenous medications needed or due during therapy sent with patient Drainage tubes (e.g. nasogastric tube or chest tube secured and vented) Endotracheal or Tracheotomy tube secured Cuff deflated of air and inflated with saline Airway suctioned Electronic Signature(s) Signed: 04/29/2015 12:59:30 PM By: Gretta Cool, RN, BSN, Kim RN, BSN Entered By: Gretta Cool, RN, BSN, Kim on 04/26/2015 09:59:04

## 2015-04-29 NOTE — Progress Notes (Signed)
[name], [name] ([id number]) Visit Report for 04/28/2015 HBO Details [name] Date of Service: 04/28/2015 8:00 AM Patient Name: J. Patient Account Number: [id number] Medical Record Treating RN: [id number] Number: Other Clinician: [name] Date of Birth/Sex: 1965/07/21 (50 y.o. Male) Treating [name], [name] Primary Care Physician/Extender: Floyde Parkins Physician: Referring Physician: Sharilyn Sites in Treatment: 15 HBO Treatment Course Details Treatment Course Ordering Physician: Christin Fudge 1 Number: HBO Treatment Start Date: 02/01/2015 Total Treatments 60 Ordered: HBO Indication: Diabetic Ulcer(s) of the Lower Extremity Standard/Conservative Wound Care tried and failed greater than or equal to 30 days Wound #2 Right, Lateral Metatarsal head fifth HBO Treatment Details Treatment Number: 52 Patient Type: Outpatient Chamber Type: Monoplace Chamber Serial #: [id number] Treatment Protocol: 2.0 ATA with 90 minutes oxygen, and no air breaks Treatment Details Compression Rate Down: 1.5 psi / minute De-Compression Rate Up: 1.5 psi / minute Air breaks and breathing Compress Tx Pressure periods Decompress Decompress Begins Reached (leave unused spaces Begins Ends blank) Chamber Pressure (ATA) 1 2 - - - - - - 2 1 Clock Time (24 hr) 08:08 08:18 - - - - - - 09:48 09:58 Treatment Length: 110 (minutes) Treatment Segments: 4 Capillary Blood Glucose Pre Capillary Blood Glucose (mg/dl): Post Capillary Blood Glucose (mg/dl): Vital Signs Capillary Blood Glucose Reference Range: 80 - 120 mg / dl HBO Diabetic Blood Glucose Intervention Range: <131 mg/dl or >249 mg/dl Time Vitals Blood Respiratory Capillary Blood Glucose Pulse Action Type: Pulse: Temperature: Taken: Pressure: Rate: Glucose (mg/dl): Meter #: Oximetry (%) Taken: [name], [name] ([id number]) Pre 07:57 144/68 84 18 98.6 161 1 none Post 10:09 156/74 90 18 98 141 1 none Treatment  Response Treatment Completion Status: Treatment Completed without Adverse Event Physician Notes No concerns with treatment given HBO Attestation I certify that I supervised this HBO treatment in accordance with Medicare guidelines. A trained Yes emergency response team is readily available per hospital policies and procedures. Continue HBOT as ordered. Yes Electronic Signature(s) Signed: 04/29/2015 7:59:30 AM By: Linton Ham MD Previous Signature: 04/28/2015 3:12:41 PM Version By: Becky Sax, Sallie RCP, RRT, CHT Entered By: Linton Ham on 04/28/2015 15:59:44 [name] ([id number]) -------------------------------------------------------------------------------- HBO Safety Checklist Details [name] Date of Service: 04/28/2015 8:00 AM Patient Name: J. Patient Account Number: [id number] Medical Record Treating RN: [id number] Number: Other Clinician: [name] Date of Birth/Sex: January 04, 1965 (50 y.o. Male) Treating [name], [name] Primary Care Physician/Extender: Floyde Parkins Physician: Referring Physician: Sharilyn Sites in Treatment: 15 HBO Safety Checklist Items Safety Checklist Consent Form Signed Patient voided / foley secured and emptied When did you last eato 06:45 am Last dose of injectable or oral agent 07:00 am NA Ostomy pouch emptied and vented if applicable NA All implantable devices assessed, documented and approved Intravenous access site secured and place Valuables secured Linens and cotton and cotton/polyester blend (less than 51% polyester) Personal oil-based products / skin lotions / body lotions removed NA Wigs or hairpieces removed NA Smoking or tobacco materials removed Books / newspapers / magazines / loose paper removed Cologne, aftershave, perfume and deodorant removed Jewelry removed (may wrap wedding band) NA Make-up removed Hair care products removed Battery operated devices (external)  removed Heating patches and chemical warmers removed NA Titanium eyewear removed NA Nail polish cured greater than 10 hours NA Casting material cured greater than 10 hours NA Hearing aids removed NA Loose dentures or partials removed NA Prosthetics have been removed Patient demonstrates correct use of air break device (if applicable) Patient  concerns have been addressed Patient grounding bracelet on and cord attached to chamber Specifics for Inpatients (complete in addition to above) Medication sheet sent with patient [name], [name] ([id number]) Intravenous medications needed or due during therapy sent with patient Drainage tubes (e.g. nasogastric tube or chest tube secured and vented) Endotracheal or Tracheotomy tube secured Cuff deflated of air and inflated with saline Airway suctioned Electronic Signature(s) Signed: 04/28/2015 3:12:41 PM By: Lorine Bears RCP, RRT, CHT Entered By: Lorine Bears on 04/28/2015 08:16:57

## 2015-04-29 NOTE — Progress Notes (Signed)
[name], [name] ([id number]) Visit Report for 04/27/2015 HBO Details [name] Date of Service: 04/27/2015 8:00 AM Patient Name: J. Patient Account Number: [id number] Medical Record Treating RN: [id number] Number: Other Clinician: Jacqulyn [name] Date of Birth/Sex: 1965/11/05 (50 y.o. Male) Treating [name], [name] Primary Care Physician/Extender: [name] Physician: Referring Physician: Sharilyn [name] in Treatment: 15 HBO Treatment Course Details Treatment Course Ordering Physician: [name] 1 Number: HBO Treatment Start Date: 02/01/2015 Total Treatments 60 Ordered: HBO Indication: Diabetic Ulcer(s) of the Lower Extremity Standard/Conservative Wound Care tried and failed greater than or equal to 30 days Wound #2 Right, Lateral Metatarsal head fifth HBO Treatment Details Treatment Number: 51 Patient Type: Outpatient Chamber Type: Monoplace Chamber Serial #: [id number] Treatment Protocol: 2.0 ATA with 90 minutes oxygen, and no air breaks Treatment Details Compression Rate Down: 1.5 psi / minute De-Compression Rate Up: 1.5 psi / minute Air breaks and breathing Compress Tx Pressure periods Decompress Decompress Begins Reached (leave unused spaces Begins Ends blank) Chamber Pressure (ATA) 1 2 - - - - - - 2 1 Clock Time (24 hr) 08:06 08:16 - - - - - - 09:46 09:57 Treatment Length: 111 (minutes) Treatment Segments: 4 Capillary Blood Glucose Pre Capillary Blood Glucose (mg/dl): Post Capillary Blood Glucose (mg/dl): Vital Signs Capillary Blood Glucose Reference Range: 80 - 120 mg / dl HBO Diabetic Blood Glucose Intervention Range: <131 mg/dl or >249 mg/dl Time Vitals Blood Respiratory Capillary Blood Glucose Pulse Action Type: Pulse: Temperature: Taken: Pressure: Rate: Glucose (mg/dl): Meter #: Oximetry (%) Taken: [name], [name] ([id number]) Pre 07:57 152/80 96 18 98.7 183 1 none Treatment Response Treatment Completion Status:  Treatment Completed without Adverse Event Physician Notes No concerns with treatment given HBO Attestation I certify that I supervised this HBO treatment in accordance with Medicare guidelines. A trained Yes emergency response team is readily available per hospital policies and procedures. Continue HBOT as ordered. Yes Electronic Signature(s) Signed: 04/29/2015 7:59:30 AM By: [name] Ham MD Entered By: [name] on 04/27/2015 15:39:37 [name] ([id number]) -------------------------------------------------------------------------------- HBO Safety Checklist Details [name] Date of Service: 04/27/2015 8:00 AM Patient Name: J. Patient Account Number: [id number] Medical Record Treating RN: [id number] Number: Other Clinician: Jacqulyn [name] Date of Birth/Sex: [birth year] (50 y.o. Male) Treating [name], [name] Primary Care Physician/Extender: [name] Physician: Referring Physician: Sharilyn [name] in Treatment: 15 HBO Safety Checklist Items Safety Checklist Consent Form Signed Patient voided / foley secured and emptied When did you last eato 06:45 am Last dose of injectable or oral agent 07:00 am NA Ostomy pouch emptied and vented if applicable NA All implantable devices assessed, documented and approved Intravenous access site secured and place Valuables secured Linens and cotton and cotton/polyester blend (less than 51% polyester) Personal oil-based products / skin lotions / body lotions removed NA Wigs or hairpieces removed NA Smoking or tobacco materials removed Books / newspapers / magazines / loose paper removed Cologne, aftershave, perfume and deodorant removed Jewelry removed (may wrap wedding band) NA Make-up removed Hair care products removed Battery operated devices (external) removed Heating patches and chemical warmers removed NA Titanium eyewear removed NA Nail polish cured greater than 10 hours NA Casting  material cured greater than 10 hours NA Hearing aids removed NA Loose dentures or partials removed NA Prosthetics have been removed Patient demonstrates correct use of air break device (if applicable) Patient concerns have been addressed Patient grounding bracelet on and cord attached to chamber Specifics for Inpatients (complete in addition to above) Medication  sheet sent with patient [name], [name] ([id number]) Intravenous medications needed or due during therapy sent with patient Drainage tubes (e.g. nasogastric tube or chest tube secured and vented) Endotracheal or Tracheotomy tube secured Cuff deflated of air and inflated with saline Airway suctioned Electronic Signature(s) Signed: 04/27/2015 4:12:22 PM By: Lorine Bears RCP, RRT, CHT Entered By: Becky Sax, Amado Nash on 04/27/2015 [id number]

## 2015-04-30 ENCOUNTER — Ambulatory Visit: Payer: Managed Care, Other (non HMO) | Admitting: Surgery

## 2015-04-30 ENCOUNTER — Encounter: Payer: Managed Care, Other (non HMO) | Admitting: Surgery

## 2015-04-30 DIAGNOSIS — E11621 Type 2 diabetes mellitus with foot ulcer: Secondary | ICD-10-CM | POA: Diagnosis not present

## 2015-04-30 LAB — GLUCOSE, CAPILLARY
Glucose-Capillary: 193 mg/dL — ABNORMAL HIGH (ref 65–99)
Glucose-Capillary: 128 mg/dL — ABNORMAL HIGH (ref 65–99)

## 2015-04-30 NOTE — Progress Notes (Signed)
[name], [name] ([id number]) Visit Report for 04/30/2015 HBO Details [name], [name] 04/30/2015 8:45 Patient Name: Date of Service: [name] Medical Record Patient Account Number: [id number] Number: Treating RN: Date of Birth/Sex: September 04, 1965 (50 y.o. Male) Other Clinician: Jacqulyn [name] Primary Care Physician: [name] Treating [name], [name] Referring Physician: [name] Physician/Extender: [name] in Treatment: 16 HBO Treatment Course Details Treatment Course Ordering Physician: [name] 1 Number: HBO Treatment Start Date: 02/01/2015 Total Treatments 60 Ordered: HBO Indication: Diabetic Ulcer(s) of the Lower Extremity Standard/Conservative Wound Care tried and failed greater than or equal to 30 days Wound #2 Right, Lateral Metatarsal head fifth HBO Treatment Details Treatment Number: 54 Patient Type: Outpatient Chamber Type: Monoplace Chamber Serial #: [id number] Treatment Protocol: 2.0 ATA with 90 minutes oxygen, and no air breaks Treatment Details Compression Rate Down: 1.5 psi / minute De-Compression Rate Up: 1.5 psi / minute Air breaks and breathing Compress Tx Pressure periods Decompress Decompress Begins Reached (leave unused spaces Begins Ends blank) Chamber Pressure (ATA) 1 2 - - - - - - 2 1 Clock Time (24 hr) 07:56 08:06 - - - - - - 09:37 09:47 Treatment Length: 111 (minutes) Treatment Segments: 4 Capillary Blood Glucose Pre Capillary Blood Glucose (mg/dl): Post Capillary Blood Glucose (mg/dl): Vital Signs Capillary Blood Glucose Reference Range: 80 - 120 mg / dl HBO Diabetic Blood Glucose Intervention Range: <131 mg/dl or >249 mg/dl Time Vitals Blood Respiratory Capillary Blood Glucose Pulse Action Type: Pulse: Temperature: Taken: Pressure: Rate: Glucose (mg/dl): Meter #: Oximetry (%) Taken: Pre 07:48 146/80 84 18 98.6 193 1 none Gammel, [place] J. ([id number]) Post 09:51 136/68 78 18 98 128 1 none Treatment  Response Treatment Completion Status: Treatment Completed without Adverse Event HBO Attestation I certify that I supervised this HBO treatment in accordance with Medicare guidelines. A trained Yes emergency response team is readily available per hospital policies and procedures. Continue HBOT as ordered. Yes Electronic Signature(s) Signed: 04/30/2015 10:05:40 [name] By: [name] Fudge MD, FACS Entered By: [name] on 04/30/2015 10:05:40 [name] -------------------------------------------------------------------------------- HBO Safety Checklist Details [name], [name] 04/30/2015 8:45 Patient Name: Date of Service: [name] Medical Record Patient Account Number: [id number] Number: Treating RN: Date of Birth/Sex: April 24, 1965 (50 y.o. Male) Other Clinician: Jacqulyn [name] Primary Care Physician: [name] Treating [name], [name] Referring Physician: [name] Physician/Extender: Weeks in Treatment: 16 HBO Safety Checklist Items Safety Checklist Consent Form Signed Patient voided / foley secured and emptied When did you last eato 07:15 [name] Last dose of injectable or oral agent 07:00 [name] NA Ostomy pouch emptied and vented if applicable NA All implantable devices assessed, documented and approved Intravenous access site secured and place Valuables secured Linens and cotton and cotton/polyester blend (less than 51% polyester) Personal oil-based products / skin lotions / body lotions removed NA Wigs or hairpieces removed NA Smoking or tobacco materials removed Books / newspapers / magazines / loose paper removed Cologne, aftershave, perfume and deodorant removed Jewelry removed (may wrap wedding band) NA Make-up removed Hair care products removed Battery operated devices (external) removed Heating patches and chemical warmers removed NA Titanium eyewear removed NA Nail polish cured greater than 10 hours NA Casting material cured  greater than 10 hours NA Hearing aids removed NA Loose dentures or partials removed NA Prosthetics have been removed Patient demonstrates correct use of air break device (if applicable) Patient concerns have been addressed Patient grounding bracelet on and cord attached to chamber Specifics for Inpatients (complete in addition to above)  Medication sheet sent with patient [name], [name] ([id number]) Intravenous medications needed or due during therapy sent with patient Drainage tubes (e.g. nasogastric tube or chest tube secured and vented) Endotracheal or Tracheotomy tube secured Cuff deflated of air and inflated with saline Airway suctioned Electronic Signature(s) Signed: 04/30/2015 11:47:17 [name] By: Lorine Bears RCP, RRT, CHT Entered By: Lorine Bears on 04/30/2015 07:53:18

## 2015-04-30 NOTE — Progress Notes (Signed)
[name], [name] ([id number]) Visit Report for 04/30/2015 Arrival Information Details [name], [name] 04/30/2015 8:45 Patient Name: Date of Service: [name] Medical Record Patient Account Number: [id number] Number: Treating RN: Date of Birth/Sex: 03-Aug-1965 (50 y.o. Male) Other Clinician: [name] Primary Care Physician: [name] Treating Britto, Errol Referring Physician: [name] Physician/Extender: Suella Grove in Treatment: 16 Visit Information History Since Last Visit Added or deleted any medications: No Patient Arrived: Ambulatory Any new allergies or adverse reactions: No Arrival Time: 07:47 Had a fall or experienced change in No Accompanied By: self activities of daily living that may affect Transfer Assistance: None risk of falls: Patient Identification Verified: Yes Signs or symptoms of abuse/neglect since last No Secondary Verification Process Yes visito Completed: Hospitalized since last visit: No Patient Requires Transmission-Based No Pain Present Now: No Precautions: Patient Has Alerts: Yes Patient Alerts: DM II Electronic Signature(s) Signed: 04/30/2015 11:47:17 AM By: Lorine Bears RCP, RRT, CHT Entered By: Becky Sax, Amado Nash on 04/30/2015 07:52:05 [name] ([id number]) -------------------------------------------------------------------------------- Encounter Discharge Information Details [name], [name] 04/30/2015 8:45 Patient Name: Date of Service: [name] Medical Record Patient Account Number: [id number] Number: Treating RN: Date of Birth/Sex: 12-28-1965 (50 y.o. Male) Other Clinician: [name] Primary Care Physician: [name] Treating Christin Fudge Referring Physician: [name] Physician/Extender: Weeks in Treatment: 16 Encounter Discharge Information Items Discharge Pain Level: 0 Discharge Condition: Stable Ambulatory Status:  Ambulatory Discharge Destination: Home Private Transportation: Auto Accompanied By: self Schedule Follow-up Appointment: No Medication Reconciliation completed and No provided to Patient/Care Romolo Sieling: Clinical Summary of Care: Notes Patient has an HBO treatment scheduled on 05/03/15 at 08:00 am. Electronic Signature(s) Signed: 04/30/2015 11:47:17 AM By: Lorine Bears RCP, RRT, CHT Entered By: Becky Sax, Amado Nash on 04/30/2015 10:04:49 [name] ([id number]) -------------------------------------------------------------------------------- Vitals Details [name], [name] 04/30/2015 8:45 Patient Name: Date of Service: [name] Medical Record Patient Account Number: [id number] Number: Treating RN: Date of Birth/Sex: 05/04/1965 (50 y.o. Male) Other Clinician: [name] Primary Care Physician: [name] Treating Britto, Errol Referring Physician: [name] Physician/Extender: Weeks in Treatment: 16 Vital Signs Time Taken: 07:48 Temperature (F): 98.6 Height (in): 74 Pulse (bpm): 84 Weight (lbs): 288 Respiratory Rate (breaths/min): 18 Body Mass Index (BMI): 37 Blood Pressure (mmHg): 146/80 Capillary Blood Glucose (mg/dl): 193 Reference Range: 80 - 120 mg / dl Electronic Signature(s) Signed: 04/30/2015 11:47:17 AM By: Lorine Bears RCP, RRT, CHT Entered By: Becky Sax, Amado Nash on 04/30/2015 07:52:28

## 2015-05-03 ENCOUNTER — Encounter: Payer: Managed Care, Other (non HMO) | Attending: Surgery | Admitting: Surgery

## 2015-05-03 DIAGNOSIS — Z794 Long term (current) use of insulin: Secondary | ICD-10-CM | POA: Diagnosis not present

## 2015-05-03 DIAGNOSIS — L84 Corns and callosities: Secondary | ICD-10-CM | POA: Insufficient documentation

## 2015-05-03 DIAGNOSIS — E1140 Type 2 diabetes mellitus with diabetic neuropathy, unspecified: Secondary | ICD-10-CM | POA: Insufficient documentation

## 2015-05-03 DIAGNOSIS — I10 Essential (primary) hypertension: Secondary | ICD-10-CM | POA: Diagnosis not present

## 2015-05-03 DIAGNOSIS — L97512 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d: Secondary | ICD-10-CM | POA: Diagnosis not present

## 2015-05-03 DIAGNOSIS — E11621 Type 2 diabetes mellitus with foot ulcer: Secondary | ICD-10-CM | POA: Insufficient documentation

## 2015-05-03 DIAGNOSIS — M86371 Chronic multifocal osteomyelitis, right ankle and foot: Secondary | ICD-10-CM | POA: Insufficient documentation

## 2015-05-03 DIAGNOSIS — E785 Hyperlipidemia, unspecified: Secondary | ICD-10-CM | POA: Insufficient documentation

## 2015-05-03 LAB — GLUCOSE, CAPILLARY: Glucose-Capillary: 169 mg/dL — ABNORMAL HIGH (ref 65–99)

## 2015-05-03 NOTE — Progress Notes (Signed)
JANDRE, SALDIERNA ([id number]) Visit Report for 05/03/2015 HBO Details Patient Name: [name], [name]. Date of Service: 05/03/2015 8:00 AM Medical Record Number: [id number] Patient Account Number: [id number] Date of Birth/Sex: October 07, 1965 (50 y.o. Male) Treating RN: Primary Care Physician: [name] Other Clinician: [name] Referring Physician: [name] Treating Physician/Extender: Frann Rider in Treatment: 16 HBO Treatment Course Details Treatment Course Ordering Physician: Christin Fudge 1 Number: HBO Treatment Start Date: 02/01/2015 Total Treatments 60 Ordered: HBO Indication: Diabetic Ulcer(s) of the Lower Extremity Standard/Conservative Wound Care tried and failed greater than or equal to 30 days Wound #2 Right, Lateral Metatarsal head fifth HBO Treatment Details Treatment Number: 55 Patient Type: Outpatient Chamber Type: Monoplace Chamber Serial #: [id number] Treatment Protocol: 2.0 ATA with 90 minutes oxygen, and no air breaks Treatment Details Compression Rate Down: 1.5 psi / minute De-Compression Rate Up: 1.5 psi / minute Air breaks and breathing Compress Tx Pressure periods Decompress Decompress Begins Reached (leave unused spaces Begins Ends blank) Chamber Pressure (ATA) 1 2 - - - - - - 2 1 Clock Time (24 hr) 08:00 08:10 - - - - - - 09:40 09:50 Treatment Length: 110 (minutes) Treatment Segments: 4 Capillary Blood Glucose Pre Capillary Blood Glucose (mg/dl): Post Capillary Blood Glucose (mg/dl): Vital Signs Capillary Blood Glucose Reference Range: 80 - 120 mg / dl HBO Diabetic Blood Glucose Intervention Range: <131 mg/dl or >249 mg/dl Time Vitals Blood Respiratory Capillary Blood Glucose Pulse Action Type: Pulse: Temperature: Taken: Pressure: Rate: Glucose (mg/dl): Meter #: Oximetry (%) Taken: Pre 07:52 138/76 84 18 98.4 162 1 none Post 09:53 132/66 84 18 98.2 169 1 none Buenger, Haydin J. ([id number]) Treatment  Response Treatment Completion Status: Treatment Completed without Adverse Event HBO Attestation I certify that I supervised this HBO treatment in accordance with Medicare guidelines. A trained Yes emergency response team is readily available per hospital policies and procedures. Continue HBOT as ordered. Yes Electronic Signature(s) Signed: 05/03/2015 10:58:42 AM By: Christin Fudge MD, FACS Entered By: Christin Fudge on 05/03/2015 10:58:42 [name] ([id number]) -------------------------------------------------------------------------------- HBO Safety Checklist Details Patient Name: [name] Date of Service: 05/03/2015 8:00 AM Medical Record Number: [id number] Patient Account Number: [id number] Date of Birth/Sex: 01-12-1965 (50 y.o. Male) Treating RN: Primary Care Physician: [name] Other Clinician: [name] Referring Physician: [name] Treating Physician/Extender: Frann Rider in Treatment: 16 HBO Safety Checklist Items Safety Checklist Consent Form Signed Patient voided / foley secured and emptied When did you last eato 07:15 am Last dose of injectable or oral agent 07:00 am NA Ostomy pouch emptied and vented if applicable NA All implantable devices assessed, documented and approved Intravenous access site secured and place Valuables secured Linens and cotton and cotton/polyester blend (less than 51% polyester) Personal oil-based products / skin lotions / body lotions removed NA Wigs or hairpieces removed NA Smoking or tobacco materials removed Books / newspapers / magazines / loose paper removed Cologne, aftershave, perfume and deodorant removed Jewelry removed (may wrap wedding band) NA Make-up removed Hair care products removed Battery operated devices (external) removed Heating patches and chemical warmers removed NA Titanium eyewear removed NA Nail polish cured greater than 10 hours NA Casting material cured  greater than 10 hours NA Hearing aids removed NA Loose dentures or partials removed NA Prosthetics have been removed Patient demonstrates correct use of air break device (if applicable) Patient concerns have been addressed Patient grounding bracelet on and cord attached to chamber Specifics for Inpatients (complete in addition to above)  Medication sheet sent with patient Intravenous medications needed or due during therapy sent with patient SHERMON, EDWARDSEN ([id number]) Drainage tubes (e.g. nasogastric tube or chest tube secured and vented) Endotracheal or Tracheotomy tube secured Cuff deflated of air and inflated with saline Airway suctioned Electronic Signature(s) Signed: 05/03/2015 1:48:24 PM By: Lorine Bears RCP, RRT, CHT Entered By: Becky Sax, Amado Nash on 05/03/2015 08:04:03

## 2015-05-03 NOTE — Progress Notes (Signed)
[name], [name] ([id number]) Visit Report for 05/03/2015 Arrival Information Details Patient Name: [name], [name]. Date of Service: 05/03/2015 8:00 AM Medical Record Number: [id number] Patient Account Number: [id number] Date of Birth/Sex: 1965/11/23 (50 y.o. Male) Treating RN: Primary Care Physician: [name] Other Clinician: [name] Referring Physician: [name] Treating Physician/Extender: Frann Rider in Treatment: 37 Visit Information History Since Last Visit Added or deleted any medications: No Patient Arrived: Ambulatory Any new allergies or adverse reactions: No Arrival Time: 07:50 Had a fall or experienced change in No Accompanied By: self activities of daily living that may affect Transfer Assistance: None risk of falls: Patient Identification Verified: Yes Signs or symptoms of abuse/neglect since last No Secondary Verification Process Yes visito Completed: Hospitalized since last visit: No Patient Requires Transmission-Based No Pain Present Now: No Precautions: Patient Has Alerts: Yes Patient Alerts: DM II Electronic Signature(s) Signed: 05/03/2015 1:48:24 PM By: Lorine Bears RCP, RRT, CHT Entered By: Lorine Bears on 05/03/2015 08:02:38 [name] ([id number]) -------------------------------------------------------------------------------- Encounter Discharge Information Details Patient Name: [name] Date of Service: 05/03/2015 8:00 AM Medical Record Number: [id number] Patient Account Number: [id number] Date of Birth/Sex: 06-13-1965 (50 y.o. Male) Treating RN: Primary Care Physician: [name] Other Clinician: [name] Referring Physician: [name] Treating Physician/Extender: Frann Rider in Treatment: 16 Encounter Discharge Information Items Discharge Pain Level: 0 Discharge Condition: Stable Ambulatory Status: Ambulatory Discharge  Destination: Home Private Transportation: Auto Accompanied By: self Schedule Follow-up Appointment: No Medication Reconciliation completed and No provided to Patient/Care Hedi Barkan: Clinical Summary of Care: Notes Patient has an HBO treatment scheduled on 05/04/15 at 08:00 am. Electronic Signature(s) Signed: 05/03/2015 1:48:24 PM By: Lorine Bears RCP, RRT, CHT Entered By: Lorine Bears on 05/03/2015 10:04:37 [name] ([id number]) -------------------------------------------------------------------------------- Vitals Details Patient Name: [name] Date of Service: 05/03/2015 8:00 AM Medical Record Number: [id number] Patient Account Number: [id number] Date of Birth/Sex: [birth year] (50 y.o. Male) Treating RN: Primary Care Physician: [name] Other Clinician: [name] Referring Physician: [name] Treating Physician/Extender: Frann Rider in Treatment: 16 Vital Signs Time Taken: 07:52 Temperature (F): 98.4 Height (in): 74 Pulse (bpm): 84 Weight (lbs): 288 Respiratory Rate (breaths/min): 18 Body Mass Index (BMI): 37 Blood Pressure (mmHg): 138/76 Capillary Blood Glucose (mg/dl): 162 Reference Range: 80 - 120 mg / dl Electronic Signature(s) Signed: 05/03/2015 1:48:24 PM By: Lorine Bears RCP, RRT, CHT Entered By: Becky Sax, Amado Nash on 05/03/2015 08:03:09

## 2015-05-04 ENCOUNTER — Encounter: Payer: Managed Care, Other (non HMO) | Admitting: Internal Medicine

## 2015-05-04 DIAGNOSIS — E11621 Type 2 diabetes mellitus with foot ulcer: Secondary | ICD-10-CM | POA: Diagnosis not present

## 2015-05-04 LAB — GLUCOSE, CAPILLARY: Glucose-Capillary: 131 mg/dL — ABNORMAL HIGH (ref 65–99)

## 2015-05-04 NOTE — Progress Notes (Signed)
CHRISTPOHER, WENGER ([id number]) Visit Report for 05/04/2015 Arrival Information Details [name] Date of Service: 05/04/2015 8:00 AM Patient Name: J. Patient Account Number: [id number] Medical Record Treating RN: [id number] Number: Other Clinician: [name] Date of Birth/Sex: 1965/06/19 (50 y.o. Male) Treating [name], [name] Primary Care Physician/Extender: Floyde Parkins Physician: Referring Physician: Sharilyn Sites in Treatment: 16 Visit Information History Since Last Visit Added or deleted any medications: No Patient Arrived: Ambulatory Any new allergies or adverse reactions: No Arrival Time: 07:47 Signs or symptoms of abuse/neglect since last No Accompanied By: self visito Transfer Assistance: None Hospitalized since last visit: No Patient Identification Verified: Yes Pain Present Now: No Secondary Verification Process Yes Completed: Patient Requires Transmission-Based No Precautions: Patient Has Alerts: Yes Patient Alerts: DM II Electronic Signature(s) Signed: 05/04/2015 10:23:51 AM By: Lorine Bears RCP, RRT, CHT Entered By: Lorine Bears on 05/04/2015 08:08:33 [name] ([id number]) -------------------------------------------------------------------------------- Encounter Discharge Information Details [name] Date of Service: 05/04/2015 8:00 AM Patient Name: J. Patient Account Number: [id number] Medical Record Treating RN: [id number] Number: Other Clinician: [name] Date of Birth/Sex: 09-22-1965 (50 y.o. Male) Treating [name], [name] Primary Care Physician/Extender: Floyde Parkins Physician: Referring Physician: Sharilyn Sites in Treatment: 16 Encounter Discharge Information Items Discharge Pain Level: 0 Discharge Condition: Stable Ambulatory Status: Ambulatory Other (Note Discharge Destination: Required) Transportation: Private Auto Accompanied By:  self Schedule Follow-up Appointment: No Medication Reconciliation completed and provided to Patient/Care No Gracemarie Skeet: Clinical Summary of Care: Notes Patient will leave here and go to work in [place]. Patient has an HBO treatment scheduled on 05/05/15 at 08:00 am. Electronic Signature(s) Signed: 05/04/2015 10:23:51 AM By: Lorine Bears RCP, RRT, CHT Entered By: Becky Sax, Amado Nash on 05/04/2015 10:23:33 [name] ([id number]) -------------------------------------------------------------------------------- Vitals Details [name] Date of Service: 05/04/2015 8:00 AM Patient Name: J. Patient Account Number: [id number] Medical Record Treating RN: [id number] Number: Other Clinician: [name] Date of Birth/Sex: [name] (50 y.o. Male) Treating [name], [name] Primary Care Physician/Extender: Floyde Parkins Physician: Referring Physician: Sharilyn Sites in Treatment: 16 Vital Signs Time Taken: 07:48 Temperature (F): 98.4 Height (in): 74 Pulse (bpm): 78 Weight (lbs): 288 Respiratory Rate (breaths/min): 18 Body Mass Index (BMI): 37 Blood Pressure (mmHg): 146/78 Capillary Blood Glucose (mg/dl): 148 Reference Range: 80 - 120 mg / dl Electronic Signature(s) Signed: 05/04/2015 10:23:51 AM By: Lorine Bears RCP, RRT, CHT Entered By: Lorine Bears on 05/04/2015 08:09:00

## 2015-05-05 ENCOUNTER — Telehealth: Payer: Self-pay | Admitting: Sports Medicine

## 2015-05-05 ENCOUNTER — Encounter: Payer: Managed Care, Other (non HMO) | Admitting: Internal Medicine

## 2015-05-05 DIAGNOSIS — E11621 Type 2 diabetes mellitus with foot ulcer: Secondary | ICD-10-CM | POA: Diagnosis not present

## 2015-05-05 LAB — GLUCOSE, CAPILLARY
Glucose-Capillary: 226 mg/dL — ABNORMAL HIGH (ref 65–99)
Glucose-Capillary: 191 mg/dL — ABNORMAL HIGH (ref 65–99)

## 2015-05-05 NOTE — Progress Notes (Signed)
RAVEN, THEYS ([id number]) Visit Report for 05/05/2015 Arrival Information Details [name] Date of Service: 05/05/2015 8:00 AM Patient Name: J. Patient Account Number: [id number] Medical Record Treating RN: [id number] Number: Other Clinician: [name] Date of Birth/Sex: 15-May-1965 (50 y.o. Male) Treating [name], [name] Primary Care Physician/Extender: Floyde Parkins Physician: Referring Physician: Sharilyn Sites in Treatment: 16 Visit Information History Since Last Visit Added or deleted any medications: No Patient Arrived: Ambulatory Any new allergies or adverse reactions: No Arrival Time: 07:50 Had a fall or experienced change in No Accompanied By: self activities of daily living that may affect Transfer Assistance: None risk of falls: Patient Identification Verified: Yes Signs or symptoms of abuse/neglect since last No Secondary Verification Process Yes visito Completed: Hospitalized since last visit: No Patient Requires Transmission-Based No Pain Present Now: No Precautions: Patient Has Alerts: Yes Patient Alerts: DM II Electronic Signature(s) Signed: 05/05/2015 11:15:59 AM By: Lorine Bears RCP, RRT, CHT Entered By: Lorine Bears on 05/05/2015 08:04:26 [name] -------------------------------------------------------------------------------- Encounter Discharge Information Details [name] Date of Service: 05/05/2015 8:00 AM Patient Name: J. Patient Account Number: [id number] Medical Record Treating RN: [id number] Number: Other Clinician: [name] Date of Birth/Sex: 01/04/1965 (50 y.o. Male) Treating [name], [name] Primary Care Physician/Extender: Floyde Parkins Physician: Referring Physician: Sharilyn Sites in Treatment: 16 Encounter Discharge Information Items Discharge Pain Level: 0 Discharge Condition: Stable Ambulatory Status:  Ambulatory Other (Note Discharge Destination: Required) Transportation: Private Auto Accompanied By: self Schedule Follow-up Appointment: No Medication Reconciliation completed and provided to Patient/Care No Mirca Yale: Clinical Summary of Care: Notes Patient will go to work from here. Patient has an HBO treatment scheduled on 05/06/15 at 08:00 am. Electronic Signature(s) Signed: 05/05/2015 11:15:59 AM By: Lorine Bears RCP, RRT, CHT Entered By: Becky Sax, Amado Nash on 05/05/2015 10:11:00 [name] -------------------------------------------------------------------------------- Vitals Details [name] Date of Service: 05/05/2015 8:00 AM Patient Name: J. Patient Account Number: [id number] Medical Record Treating RN: [id number] Number: Other Clinician: [name] Date of Birth/Sex: 06-05-1965 (50 y.o. Male) Treating [name], [name] Primary Care Physician/Extender: Floyde Parkins Physician: Referring Physician: Sharilyn Sites in Treatment: 16 Vital Signs Time Taken: 07:54 Temperature (F): 98.3 Height (in): 74 Pulse (bpm): 96 Weight (lbs): 288 Respiratory Rate (breaths/min): 18 Body Mass Index (BMI): 37 Blood Pressure (mmHg): 152/74 Capillary Blood Glucose (mg/dl): 226 Reference Range: 80 - 120 mg / dl Electronic Signature(s) Signed: 05/05/2015 11:15:59 AM By: Lorine Bears RCP, RRT, CHT Entered By: Becky Sax, Amado Nash on 05/05/2015 08:04:59

## 2015-05-05 NOTE — Telephone Encounter (Signed)
PT CALLED IN WANTING TO KNOW IF HIS AUTHORIZATION FOR HIS DIABETIC SHOES HAS CAME IN

## 2015-05-05 NOTE — Progress Notes (Signed)
[name] ([id number]) Visit Report for 05/04/2015 HBO Details [name] Date of Service: 05/04/2015 8:00 AM Patient Name: J. Patient Account Number: [id number] Medical Record Treating RN: [id number] Number: Other Clinician: Jacqulyn [name] Date of Birth/Sex: 08-15-1965 (50 y.o. Male) Treating [name] Primary Care Physician/Extender: [name] Physician: Referring Physician: Sharilyn [name] in Treatment: 16 HBO Treatment Course Details Treatment Course Ordering Physician: [name] 1 Number: HBO Treatment Start Date: 02/01/2015 Total Treatments 60 Ordered: HBO Indication: Diabetic Ulcer(s) of the Lower Extremity Standard/Conservative Wound Care tried and failed greater than or equal to 30 days Wound #2 Right, Lateral Metatarsal head fifth HBO Treatment Details Treatment Number: 56 Patient Type: Outpatient Chamber Type: Monoplace Chamber Serial #: [id number] Treatment Protocol: 2.0 ATA with 90 minutes oxygen, and no air breaks Treatment Details Compression Rate Down: 1.5 psi / minute De-Compression Rate Up: 1.5 psi / minute Air breaks and breathing Compress Tx Pressure periods Decompress Decompress Begins Reached (leave unused spaces Begins Ends blank) Chamber Pressure (ATA) 1 2 - - - - - - 2 1 Clock Time (24 hr) 07:58 08:08 - - - - - - 09:38 09:48 Treatment Length: 110 (minutes) Treatment Segments: 4 Capillary Blood Glucose Pre Capillary Blood Glucose (mg/dl): Post Capillary Blood Glucose (mg/dl): Vital Signs Capillary Blood Glucose Reference Range: 80 - 120 mg / dl HBO Diabetic Blood Glucose Intervention Range: <131 mg/dl or >249 mg/dl Time Vitals Blood Respiratory Capillary Blood Glucose Pulse Action Type: Pulse: Temperature: Taken: Pressure: Rate: Glucose (mg/dl): Meter #: Oximetry (%) Taken: [name], [name] ([id number]) Pre 07:48 146/78 78 18 98.4 148 1 none Post 09:51 142/72 78 18 97.7 131 1 none Treatment  Response Treatment Completion Status: Treatment Completed without Adverse Event Electronic Signature(s) Signed: 05/04/2015 10:23:51 AM By: [name] RCP, RRT, CHT Signed: 05/05/2015 8:00:31 AM By: [name] Ham MD Entered By: [name] on 05/04/2015 10:18:17 [name] ([id number]) -------------------------------------------------------------------------------- HBO Safety Checklist Details [name] Date of Service: 05/04/2015 8:00 AM Patient Name: J. Patient Account Number: [id number] Medical Record Treating RN: [id number] Number: Other Clinician: Jacqulyn [name] Date of Birth/Sex: 08/16/1965 (50 y.o. Male) Treating [name] Primary Care Physician/Extender: [name] Physician: Referring Physician: Sharilyn [name] in Treatment: 16 HBO Safety Checklist Items Safety Checklist Consent Form Signed Patient voided / foley secured and emptied When did you last eato 07:15 am Last dose of injectable or oral agent 07:00 am NA Ostomy pouch emptied and vented if applicable NA All implantable devices assessed, documented and approved Intravenous access site secured and place Valuables secured Linens and cotton and cotton/polyester blend (less than 51% polyester) Personal oil-based products / skin lotions / body lotions removed NA Wigs or hairpieces removed NA Smoking or tobacco materials removed Books / newspapers / magazines / loose paper removed Cologne, aftershave, perfume and deodorant removed Jewelry removed (may wrap wedding band) NA Make-up removed Hair care products removed Battery operated devices (external) removed Heating patches and chemical warmers removed NA Titanium eyewear removed NA Nail polish cured greater than 10 hours NA Casting material cured greater than 10 hours NA Hearing aids removed NA Loose dentures or partials removed NA Prosthetics have been removed Patient demonstrates correct  use of air break device (if applicable) Patient concerns have been addressed Patient grounding bracelet on and cord attached to chamber Specifics for Inpatients (complete in addition to above) Medication sheet sent with patient [name] ([id number]) Intravenous medications needed or due during therapy sent with patient Drainage tubes (  e.g. nasogastric tube or chest tube secured and vented) Endotracheal or Tracheotomy tube secured Cuff deflated of air and inflated with saline Airway suctioned Electronic Signature(s) Signed: 05/04/2015 10:23:51 AM By: [name] RCP, RRT, CHT Entered By: Becky Sax, Amado Nash on 05/04/2015 08:09:45

## 2015-05-06 ENCOUNTER — Encounter: Payer: Managed Care, Other (non HMO) | Admitting: Surgery

## 2015-05-06 DIAGNOSIS — E11621 Type 2 diabetes mellitus with foot ulcer: Secondary | ICD-10-CM | POA: Diagnosis not present

## 2015-05-06 LAB — GLUCOSE, CAPILLARY
Glucose-Capillary: 162 mg/dL — ABNORMAL HIGH (ref 65–99)
Glucose-Capillary: 148 mg/dL — ABNORMAL HIGH (ref 65–99)
Glucose-Capillary: 157 mg/dL — ABNORMAL HIGH (ref 65–99)
Glucose-Capillary: 139 mg/dL — ABNORMAL HIGH (ref 65–99)

## 2015-05-06 NOTE — Progress Notes (Signed)
SAYGE, MCDONNELL ([id number]) Visit Report for 05/06/2015 HBO Details Patient Name: [name], [name]. Date of Service: 05/06/2015 8:00 AM Medical Record Number: [id number] Patient Account Number: [id number] Date of Birth/Sex: 1965/04/02 (50 y.o. Male) Treating RN: Primary Care Physician: [name] Other Clinician: [name] Referring Physician: [name] Treating Physician/Extender: Frann Rider in Treatment: 17 HBO Treatment Course Details Treatment Course Ordering Physician: Christin Fudge 1 Number: HBO Treatment Start Date: 02/01/2015 Total Treatments 60 Ordered: HBO Indication: Diabetic Ulcer(s) of the Lower Extremity Standard/Conservative Wound Care tried and failed greater than or equal to 30 days Wound #2 Right, Lateral Metatarsal head fifth HBO Treatment Details Treatment Number: 58 Patient Type: Outpatient Chamber Type: Monoplace Chamber Serial #: [id number] Treatment Protocol: 2.0 ATA with 90 minutes oxygen, and no air breaks Treatment Details Compression Rate Down: 1.5 psi / minute De-Compression Rate Up: 1.5 psi / minute Air breaks and breathing Compress Tx Pressure periods Decompress Decompress Begins Reached (leave unused spaces Begins Ends blank) Chamber Pressure (ATA) 1 2 - - - - - - 2 1 Clock Time (24 hr) 07:58 08:08 - - - - - - 09:38 09:48 Treatment Length: 110 (minutes) Treatment Segments: 4 Capillary Blood Glucose Pre Capillary Blood Glucose (mg/dl): Post Capillary Blood Glucose (mg/dl): Vital Signs Capillary Blood Glucose Reference Range: 80 - 120 mg / dl HBO Diabetic Blood Glucose Intervention Range: <131 mg/dl or >249 mg/dl Time Vitals Blood Respiratory Capillary Blood Glucose Pulse Action Type: Pulse: Temperature: Taken: Pressure: Rate: Glucose (mg/dl): Meter #: Oximetry (%) Taken: Pre 07:50 154/72 78 18 98.3 157 1 none Post 09:52 130/66 84 18 98.3 139 1 none [name], [name] J. ([id number]) Treatment  Response Treatment Completion Status: Treatment Completed without Adverse Event HBO Attestation I certify that I supervised this HBO treatment in accordance with Medicare guidelines. A trained Yes emergency response team is readily available per hospital policies and procedures. Continue HBOT as ordered. Yes Electronic Signature(s) Signed: 05/06/2015 10:42:41 AM By: Christin Fudge MD, FACS Entered By: Christin Fudge on 05/06/2015 10:42:41 [name] ([id number]) -------------------------------------------------------------------------------- HBO Safety Checklist Details Patient Name: [name] Date of Service: 05/06/2015 8:00 AM Medical Record Number: [id number] Patient Account Number: [id number] Date of Birth/Sex: 01/15/1965 (49 y.o. Male) Treating RN: Primary Care Physician: [name] Other Clinician: [name] Referring Physician: [name] Treating Physician/Extender: Frann Rider in Treatment: 17 HBO Safety Checklist Items Safety Checklist Consent Form Signed Patient voided / foley secured and emptied When did you last eato 06:45 am Last dose of injectable or oral agent 07:00 am NA Ostomy pouch emptied and vented if applicable NA All implantable devices assessed, documented and approved Intravenous access site secured and place Valuables secured Linens and cotton and cotton/polyester blend (less than 51% polyester) Personal oil-based products / skin lotions / body lotions removed NA Wigs or hairpieces removed NA Smoking or tobacco materials removed Books / newspapers / magazines / loose paper removed Cologne, aftershave, perfume and deodorant removed Jewelry removed (may wrap wedding band) NA Make-up removed Hair care products removed Battery operated devices (external) removed Heating patches and chemical warmers removed NA Titanium eyewear removed NA Nail polish cured greater than 10 hours NA Casting material cured  greater than 10 hours NA Hearing aids removed NA Loose dentures or partials removed NA Prosthetics have been removed Patient demonstrates correct use of air break device (if applicable) Patient concerns have been addressed Patient grounding bracelet on and cord attached to chamber Specifics for Inpatients (complete in addition to above)  Medication sheet sent with patient Intravenous medications needed or due during therapy sent with patient [name], [name] ([id number]) Drainage tubes (e.g. nasogastric tube or chest tube secured and vented) Endotracheal or Tracheotomy tube secured Cuff deflated of air and inflated with saline Airway suctioned Electronic Signature(s) Signed: 05/06/2015 12:36:55 PM By: Lorine Bears RCP, RRT, CHT Entered By: Lorine Bears on 05/06/2015 08:00:35

## 2015-05-06 NOTE — Progress Notes (Signed)
[name] ([id number]) Visit Report for 05/05/2015 HBO Details [name] Date of Service: 05/05/2015 8:00 AM Patient Name: J. Patient Account Number: [id number] Medical Record Treating RN: [id number] Number: Other Clinician: [name] Date of Birth/Sex: [birth year] (50 y.o. Male) Treating [name] Primary Care Physician/Extender: Floyde Parkins Physician: Referring Physician: Sharilyn Sites in Treatment: 16 HBO Treatment Course Details Treatment Course Ordering Physician: Christin Fudge 1 Number: HBO Treatment Start Date: 02/01/2015 Total Treatments 60 Ordered: HBO Indication: Diabetic Ulcer(s) of the Lower Extremity Standard/Conservative Wound Care tried and failed greater than or equal to 30 days Wound #2 Right, Lateral Metatarsal head fifth HBO Treatment Details Treatment Number: 57 Patient Type: Outpatient Chamber Type: Monoplace Chamber Serial #: [id number] Treatment Protocol: 2.0 ATA with 90 minutes oxygen, and no air breaks Treatment Details Compression Rate Down: 1.5 psi / minute De-Compression Rate Up: 1.5 psi / minute Air breaks and breathing Compress Tx Pressure periods Decompress Decompress Begins Reached (leave unused spaces Begins Ends blank) Chamber Pressure (ATA) 1 2 - - - - - - 2 1 Clock Time (24 hr) 08:02 08:12 - - - - - - 09:42 09:52 Treatment Length: 110 (minutes) Treatment Segments: 4 Capillary Blood Glucose Pre Capillary Blood Glucose (mg/dl): Post Capillary Blood Glucose (mg/dl): Vital Signs Capillary Blood Glucose Reference Range: 80 - 120 mg / dl HBO Diabetic Blood Glucose Intervention Range: <131 mg/dl or >249 mg/dl Time Vitals Blood Respiratory Capillary Blood Glucose Pulse Action Type: Pulse: Temperature: Taken: Pressure: Rate: Glucose (mg/dl): Meter #: Oximetry (%) Taken: ROBT, GUNNELL ([id number]) Pre 07:54 152/74 96 18 98.3 226 1 none Post 09:55 142/70 90 18 98.1 142 1 none Treatment  Response Treatment Completion Status: Treatment Completed without Adverse Event Electronic Signature(s) Signed: 05/05/2015 11:15:59 AM By: Lorine Bears RCP, RRT, CHT Signed: 05/06/2015 1:24:33 PM By: Linton Ham MD Entered By: Lorine Bears on 05/05/2015 10:09:06 [name] -------------------------------------------------------------------------------- HBO Safety Checklist Details [name] Date of Service: 05/05/2015 8:00 AM Patient Name: J. Patient Account Number: [id number] Medical Record Treating RN: [id number] Number: Other Clinician: [name] Date of Birth/Sex: [birth year] (50 y.o. Male) Treating [name] Primary Care Physician/Extender: Floyde Parkins Physician: Referring Physician: Sharilyn Sites in Treatment: 16 HBO Safety Checklist Items Safety Checklist Consent Form Signed Patient voided / foley secured and emptied When did you last eato 07:15 am Last dose of injectable or oral agent 07:00 am NA Ostomy pouch emptied and vented if applicable NA All implantable devices assessed, documented and approved Intravenous access site secured and place Valuables secured Linens and cotton and cotton/polyester blend (less than 51% polyester) Personal oil-based products / skin lotions / body lotions removed NA Wigs or hairpieces removed NA Smoking or tobacco materials removed Books / newspapers / magazines / loose paper removed Cologne, aftershave, perfume and deodorant removed Jewelry removed (may wrap wedding band) NA Make-up removed Hair care products removed Battery operated devices (external) removed Heating patches and chemical warmers removed NA Titanium eyewear removed NA Nail polish cured greater than 10 hours NA Casting material cured greater than 10 hours NA Hearing aids removed NA Loose dentures or partials removed NA Prosthetics have been removed Patient demonstrates correct  use of air break device (if applicable) Patient concerns have been addressed Patient grounding bracelet on and cord attached to chamber Specifics for Inpatients (complete in addition to above) Medication sheet sent with patient MARUICE, MITMAN ([id number]) Intravenous medications needed or due during therapy sent with patient Drainage tubes (  e.g. nasogastric tube or chest tube secured and vented) Endotracheal or Tracheotomy tube secured Cuff deflated of air and inflated with saline Airway suctioned Electronic Signature(s) Signed: 05/05/2015 11:15:59 AM By: Lorine Bears RCP, RRT, CHT Entered By: Becky Sax, Amado Nash on 05/05/2015 08:18:23

## 2015-05-06 NOTE — Progress Notes (Signed)
ARRAN, MONACO ([id number]) Visit Report for 05/06/2015 Arrival Information Details Patient Name: [name]. Date of Service: 05/06/2015 8:00 AM Medical Record Number: [id number] Patient Account Number: [id number] Date of Birth/Sex: 03/31/1965 (50 y.o. Male) Treating RN: Primary Care Physician: [name] Other Clinician: [name] Referring Physician: [name] Treating Physician/Extender: Frann Rider in Treatment: 52 Visit Information History Since Last Visit Added or deleted any medications: No Patient Arrived: Ambulatory Any new allergies or adverse reactions: No Arrival Time: 07:49 Signs or symptoms of abuse/neglect since last No Accompanied By: self visito Transfer Assistance: None Hospitalized since last visit: No Patient Identification Verified: Yes Pain Present Now: No Secondary Verification Process Yes Completed: Patient Requires Transmission-Based No Precautions: Patient Has Alerts: Yes Patient Alerts: DM II Electronic Signature(s) Signed: 05/06/2015 12:36:55 PM By: Lorine Bears RCP, RRT, CHT Entered By: Lorine Bears on 05/06/2015 07:59:14 [name] ([id number]) -------------------------------------------------------------------------------- Encounter Discharge Information Details Patient Name: [name] Date of Service: 05/06/2015 8:00 AM Medical Record Number: [id number] Patient Account Number: [id number] Date of Birth/Sex: 1965/11/23 (50 y.o. Male) Treating RN: Primary Care Physician: [name] Other Clinician: [name] Referring Physician: [name] Treating Physician/Extender: Frann Rider in Treatment: 69 Encounter Discharge Information Items Discharge Pain Level: 0 Discharge Condition: Stable Ambulatory Status: Ambulatory Other (Note Discharge Destination: Required) Transportation: Private Auto Accompanied By: self Schedule  Follow-up Appointment: No Medication Reconciliation completed and provided to Patient/Care No Tiwanna Tuch: Clinical Summary of Care: Notes Patient will leave here and go to work. Patient has an HBO treatment scheduled on 05/07/15 at 08:00 am. Electronic Signature(s) Signed: 05/06/2015 12:36:55 PM By: Lorine Bears RCP, RRT, CHT Entered By: Lorine Bears on 05/06/2015 10:03:39 [name] ([id number]) -------------------------------------------------------------------------------- Vitals Details Patient Name: [name] Date of Service: 05/06/2015 8:00 AM Medical Record Number: [id number] Patient Account Number: [id number] Date of Birth/Sex: 1965/08/04 (50 y.o. Male) Treating RN: Primary Care Physician: [name] Other Clinician: [name] Referring Physician: [name] Treating Physician/Extender: Frann Rider in Treatment: 17 Vital Signs Time Taken: 07:50 Temperature (F): 98.3 Height (in): 74 Pulse (bpm): 78 Weight (lbs): 288 Respiratory Rate (breaths/min): 18 Body Mass Index (BMI): 37 Blood Pressure (mmHg): 154/72 Capillary Blood Glucose (mg/dl): 157 Reference Range: 80 - 120 mg / dl Electronic Signature(s) Signed: 05/06/2015 12:36:55 PM By: Lorine Bears RCP, RRT, CHT Entered By: Lorine Bears on 05/06/2015 07:59:40

## 2015-05-07 ENCOUNTER — Ambulatory Visit: Payer: Managed Care, Other (non HMO) | Admitting: Surgery

## 2015-05-07 ENCOUNTER — Encounter: Payer: Managed Care, Other (non HMO) | Admitting: Surgery

## 2015-05-07 DIAGNOSIS — E11621 Type 2 diabetes mellitus with foot ulcer: Secondary | ICD-10-CM | POA: Diagnosis not present

## 2015-05-07 LAB — GLUCOSE, CAPILLARY: Glucose-Capillary: 113 mg/dL — ABNORMAL HIGH (ref 65–99)

## 2015-05-07 NOTE — Progress Notes (Signed)
FRIEDRICH, ALDRIDGE ([id number]) Visit Report for 05/07/2015 Arrival Information Details Patient Name: [name], [name]. Date of Service: 05/07/2015 8:00 AM Medical Record Number: [id number] Patient Account Number: [id number] Date of Birth/Sex: 1965-04-12 (50 y.o. Male) Treating RN: Primary Care Physician: [name] Other Clinician: [name] Referring Physician: [name] Treating Physician/Extender: Frann Rider in Treatment: 2 Visit Information History Since Last Visit Added or deleted any medications: No Patient Arrived: Ambulatory Any new allergies or adverse reactions: No Arrival Time: 07:45 Had a fall or experienced change in No Accompanied By: self activities of daily living that may affect Transfer Assistance: None risk of falls: Patient Identification Verified: Yes Signs or symptoms of abuse/neglect since last No Secondary Verification Process Yes visito Completed: Hospitalized since last visit: No Patient Requires Transmission-Based No Pain Present Now: No Precautions: Patient Has Alerts: Yes Patient Alerts: DM II Electronic Signature(s) Signed: 05/07/2015 12:00:47 PM By: Lorine Bears RCP, RRT, CHT Entered By: Lorine Bears on 05/07/2015 08:11:11 [name] ([id number]) -------------------------------------------------------------------------------- Encounter Discharge Information Details Patient Name: [name] Date of Service: 05/07/2015 8:00 AM Medical Record Number: [id number] Patient Account Number: [id number] Date of Birth/Sex: 01-16-1965 (50 y.o. Male) Treating RN: Primary Care Physician: [name] Other Clinician: [name] Referring Physician: [name] Treating Physician/Extender: Frann Rider in Treatment: 47 Encounter Discharge Information Items Discharge Pain Level: 0 Discharge Condition: Stable Ambulatory Status: Ambulatory Other  (Note Discharge Destination: Required) Transportation: Private Auto Accompanied By: self Schedule Follow-up Appointment: No Medication Reconciliation completed and provided to Patient/Care No [name]: Clinical Summary of Care: Notes Patient will go to work from here. Patient has an HBO treatment scheduled on 05/10/15 at 08:00 am. Electronic Signature(s) Signed: 05/07/2015 12:00:47 PM By: Lorine Bears RCP, RRT, CHT Entered By: Lorine Bears on 05/07/2015 10:37:19 [name] ([id number]) -------------------------------------------------------------------------------- Vitals Details Patient Name: [name] Date of Service: 05/07/2015 8:00 AM Medical Record Number: [id number] Patient Account Number: [id number] Date of Birth/Sex: [birth year] (50 y.o. Male) Treating RN: Primary Care Physician: [name] Other Clinician: [name] Referring Physician: [name] Treating Physician/Extender: Frann Rider in Treatment: 17 Vital Signs Time Taken: 07:45 Temperature (F): 98.5 Height (in): 74 Pulse (bpm): 84 Weight (lbs): 288 Respiratory Rate (breaths/min): 18 Body Mass Index (BMI): 37 Blood Pressure (mmHg): 142/66 Capillary Blood Glucose (mg/dl): 179 Reference Range: 80 - 120 mg / dl Electronic Signature(s) Signed: 05/07/2015 12:00:47 PM By: Lorine Bears RCP, RRT, CHT Entered By: Becky Sax, Amado Nash on 05/07/2015 08:11:54

## 2015-05-07 NOTE — Progress Notes (Signed)
[name], [name] ([id number]) Visit Report for 05/07/2015 HBO Details Patient Name: [name], [name]. Date of Service: 05/07/2015 8:00 AM Medical Record Number: [id number] Patient Account Number: [id number] Date of Birth/Sex: [birth year] (50 y.o. Male) Treating RN: Primary Care Physician: [name] Other Clinician: [name] Referring Physician: [name] Treating Physician/Extender: Frann Rider in Treatment: 17 HBO Treatment Course Details Treatment Course Ordering Physician: Christin Fudge 1 Number: HBO Treatment Start Date: 02/01/2015 Total Treatments 60 Ordered: HBO Indication: Diabetic Ulcer(s) of the Lower Extremity Standard/Conservative Wound Care tried and failed greater than or equal to 30 days Wound #2 Right, Lateral Metatarsal head fifth HBO Treatment Details Treatment Number: 59 Patient Type: Outpatient Chamber Type: Monoplace Chamber Serial #: [id number] Treatment Protocol: 2.0 ATA with 90 minutes oxygen, and no air breaks Treatment Details Compression Rate Down: 1.5 psi / minute De-Compression Rate Up: 1.5 psi / minute Air breaks and breathing Compress Tx Pressure periods Decompress Decompress Begins Reached (leave unused spaces Begins Ends blank) Chamber Pressure (ATA) 1 2 - - - - - - 2 1 Clock Time (24 hr) 07:58 08:08 - - - - - - 09:39 09:49 Treatment Length: 111 (minutes) Treatment Segments: 4 Capillary Blood Glucose Pre Capillary Blood Glucose (mg/dl): Post Capillary Blood Glucose (mg/dl): Vital Signs Capillary Blood Glucose Reference Range: 80 - 120 mg / dl HBO Diabetic Blood Glucose Intervention Range: <131 mg/dl or >249 mg/dl Time Vitals Blood Respiratory Capillary Blood Glucose Pulse Action Type: Pulse: Temperature: Taken: Pressure: Rate: Glucose (mg/dl): Meter #: Oximetry (%) Taken: Pre 07:45 142/66 84 18 98.5 179 1 none Post 09:51 158/82 78 18 98.3 113 1 none [name], [name] J. ([id number]) Treatment  Response Treatment Completion Status: Treatment Completed without Adverse Event HBO Attestation I certify that I supervised this HBO treatment in accordance with Medicare guidelines. A trained Yes emergency response team is readily available per hospital policies and procedures. Continue HBOT as ordered. Yes Electronic Signature(s) Signed: 05/07/2015 10:42:42 AM By: Christin Fudge MD, FACS Entered By: Christin Fudge on 05/07/2015 10:42:42 [name] ([id number]) -------------------------------------------------------------------------------- HBO Safety Checklist Details Patient Name: [name] Date of Service: 05/07/2015 8:00 AM Medical Record Number: [id number] Patient Account Number: [id number] Date of Birth/Sex: 1965/07/28 (50 y.o. Male) Treating RN: Primary Care Physician: [name] Other Clinician: [name] Referring Physician: [name] Treating Physician/Extender: Frann Rider in Treatment: 17 HBO Safety Checklist Items Safety Checklist Consent Form Signed Patient voided / foley secured and emptied When did you last eato 07:00 am Last dose of injectable or oral agent 07:00 am NA Ostomy pouch emptied and vented if applicable NA All implantable devices assessed, documented and approved NA Intravenous access site secured and place Valuables secured Linens and cotton and cotton/polyester blend (less than 51% polyester) Personal oil-based products / skin lotions / body lotions removed NA Wigs or hairpieces removed NA Smoking or tobacco materials removed Books / newspapers / magazines / loose paper removed Cologne, aftershave, perfume and deodorant removed Jewelry removed (may wrap wedding band) NA Make-up removed Hair care products removed Battery operated devices (external) removed Heating patches and chemical warmers removed NA Titanium eyewear removed NA Nail polish cured greater than 10 hours NA Casting material cured  greater than 10 hours NA Hearing aids removed NA Loose dentures or partials removed NA Prosthetics have been removed Patient demonstrates correct use of air break device (if applicable) Patient concerns have been addressed Patient grounding bracelet on and cord attached to chamber Specifics for Inpatients (complete in addition to  above) Medication sheet sent with patient Intravenous medications needed or due during therapy sent with patient [name], [name] ([id number]) Drainage tubes (e.g. nasogastric tube or chest tube secured and vented) Endotracheal or Tracheotomy tube secured Cuff deflated of air and inflated with saline Airway suctioned Electronic Signature(s) Signed: 05/07/2015 12:00:47 PM By: Lorine Bears RCP, RRT, CHT Entered By: Lorine Bears on 05/07/2015 08:13:13

## 2015-05-10 ENCOUNTER — Encounter: Payer: Managed Care, Other (non HMO) | Admitting: Surgery

## 2015-05-10 DIAGNOSIS — E11621 Type 2 diabetes mellitus with foot ulcer: Secondary | ICD-10-CM | POA: Diagnosis not present

## 2015-05-10 LAB — GLUCOSE, CAPILLARY
Glucose-Capillary: 179 mg/dL — ABNORMAL HIGH (ref 65–99)
Glucose-Capillary: 164 mg/dL — ABNORMAL HIGH (ref 65–99)
Glucose-Capillary: 186 mg/dL — ABNORMAL HIGH (ref 65–99)

## 2015-05-10 NOTE — Progress Notes (Addendum)
SUKHDEEP, SANZONE ([id number]) Visit Report for 05/10/2015 HBO Details Patient Name: [name]. Date of Service: 05/10/2015 8:00 AM Medical Record Number: [id number] Patient Account Number: [id number] Date of Birth/Sex: Apr 22, 1965 (50 y.o. Male) Treating RN: [name] Primary Care Physician: [name] Other Clinician: Referring Physician: [name] Treating Physician/Extender: [name] in Treatment: 17 HBO Treatment Course Details Treatment Course Ordering 1 [name] Number: Physician: Total Treatments HBO Treatment 60 02/01/2015 Ordered: Start Date: HBO Indication: HBO Treatment 05/10/2015 Diabetic Ulcer(s) of the Lower Extremity End Date: Treatment Series Complete; Wound HBO Discharge Progress Plateaued / Significant Outcome: Improvement HBO Treatment Details Treatment Number: 60 Patient Type: Outpatient Chamber Type: Monoplace Chamber Serial #: [id number] Treatment Protocol: 2.0 ATA with 90 minutes oxygen, and no air breaks Treatment Details Compression Rate Down: 1.5 psi / minute De-Compression Rate Up: 1.5 psi / minute Air breaks and breathing Compress Tx Pressure periods Decompress Decompress Begins Reached (leave unused spaces Begins Ends blank) Chamber Pressure (ATA) 1 2 - - - - - - 2 1 Clock Time (24 hr) 08:00 08:10 - - - - - - 09:41 09:52 Treatment Length: 112 (minutes) Treatment Segments: 4 Capillary Blood Glucose Pre Capillary Blood Glucose (mg/dl): Post Capillary Blood Glucose (mg/dl): Vital Signs Capillary Blood Glucose Reference Range: 80 - 120 mg / dl HBO Diabetic Blood Glucose Intervention Range: <131 mg/dl or >249 mg/dl Time Vitals Blood Respiratory Capillary Blood Glucose Pulse Action Type: Pulse: Temperature: Taken: Pressure: Rate: Glucose (mg/dl): Meter #: Oximetry (%) Taken: Pre 08:03 158/82 72 18 98.4 164 Post 09:55 132/82 86 18 97.9 186 [name], [name]. ([id number]) Treatment  Response Treatment Well Toleration: HBO Attestation I certify that I supervised this HBO treatment in accordance with Medicare guidelines. A trained Yes emergency response team is readily available per hospital policies and procedures. Continue HBOT as ordered. Yes Electronic Signature(s) Signed: 05/10/2015 10:39:45 AM By: [name] Fudge MD, FACS Previous Signature: 05/10/2015 10:07:58 AM Version By: [name] Cool RN, BSN, Kim RN, BSN Previous Signature: 05/10/2015 8:35:36 AM Version By: [name] Cool RN, BSN, Kim RN, BSN Entered By: [name] on 05/10/2015 10:39:45 [name] ([id number]) -------------------------------------------------------------------------------- HBO Safety Checklist Details Patient Name: [name], [name]. Date of Service: 05/10/2015 8:00 AM Medical Record Number: [id number] Patient Account Number: [id number] Date of Birth/Sex: 05-26-1965 (50 y.o. Male) Treating RN: [name] Primary Care Physician: [name] Other Clinician: Referring Physician: [name] Treating Physician/Extender: [name] in Treatment: 17 HBO Safety Checklist Items Safety Checklist Consent Form Signed Patient voided / foley secured and emptied When did you last eato 0715 Last dose of injectable or oral agent 0700 NA Ostomy pouch emptied and vented if applicable NA All implantable devices assessed, documented and approved NA Intravenous access site secured and place Valuables secured Linens and cotton and cotton/polyester blend (less than 51% polyester) Personal oil-based products / skin lotions / body lotions removed Wigs or hairpieces removed Smoking or tobacco materials removed Books / newspapers / magazines / loose paper removed Cologne, aftershave, perfume and deodorant removed Jewelry removed (may wrap wedding band) Make-up removed Hair care products removed NA Battery operated devices (external) removed NA Heating patches and chemical warmers  removed NA Titanium eyewear removed NA Nail polish cured greater than 10 hours NA Casting material cured greater than 10 hours NA Hearing aids removed NA Loose dentures or partials removed NA Prosthetics have been removed Patient demonstrates correct use of air break device (if applicable) Patient concerns have been addressed Patient grounding bracelet on and  cord attached to chamber Specifics for Inpatients (complete in addition to above) Medication sheet sent with patient Intravenous medications needed or due during therapy sent with patient [name], [name] ([id number]) Drainage tubes (e.g. nasogastric tube or chest tube secured and vented) Endotracheal or Tracheotomy tube secured Cuff deflated of air and inflated with saline Airway suctioned Electronic Signature(s) Signed: 05/10/2015 8:05:24 AM By: [name] Cool, RN, BSN, Kim RN, BSN Entered By: [name] Cool, RN, BSN, [name] on 05/10/2015 [id number]

## 2015-05-10 NOTE — Progress Notes (Addendum)
MATAEO, PERSING ([id number]) Visit Report for 05/10/2015 Arrival Information Details Patient Name: XIN, BOSSHARDT. Date of Service: 05/10/2015 8:00 AM Medical Record Number: [id number] Patient Account Number: [id number] Date of Birth/Sex: 1965/03/04 (50 y.o. Male) Treating RN: Cornell Barman Primary Care Physician: [name] Other Clinician: Referring Physician: [name] Treating Physician/Extender: Frann Rider in Treatment: 53 Visit Information History Since Last Visit Added or deleted any medications: No Patient Arrived: Ambulatory Any new allergies or adverse reactions: No Arrival Time: 07:55 Had a fall or experienced change in No Accompanied By: self activities of daily living that may affect Transfer Assistance: None risk of falls: Patient Identification Verified: Yes Signs or symptoms of abuse/neglect since last No Secondary Verification Process Yes visito Completed: Hospitalized since last visit: No Patient Requires Transmission-Based No Pain Present Now: No Precautions: Patient Has Alerts: Yes Patient Alerts: DM II Electronic Signature(s) Signed: 05/10/2015 8:03:20 AM By: Gretta Cool, RN, BSN, Kim RN, BSN Entered By: Gretta Cool, RN, BSN, Kim on 05/10/2015 08:03:20 [name] -------------------------------------------------------------------------------- Encounter Discharge Information Details Patient Name: HEMINGWAY, MCRILL. Date of Service: 05/10/2015 8:00 AM Medical Record Number: [id number] Patient Account Number: [id number] Date of Birth/Sex: 08/28/1965 (50 y.o. Male) Treating RN: Cornell Barman Primary Care Physician: [name] Other Clinician: Referring Physician: [name] Treating Physician/Extender: Frann Rider in Treatment: 11 Encounter Discharge Information Items Discharge Pain Level: 0 Discharge Condition: Stable Ambulatory Status: Ambulatory Discharge Destination:  Home Private Transportation: Auto Accompanied By: self Schedule Follow-up Appointment: No Medication Reconciliation completed and Yes provided to Patient/Care Dyamon Sosinski: Clinical Summary of Care: Electronic Signature(s) Signed: 05/10/2015 10:10:09 AM By: Gretta Cool, RN, BSN, Kim RN, BSN Entered By: Gretta Cool, RN, BSN, Kim on 05/10/2015 10:10:09 [name] -------------------------------------------------------------------------------- Vitals Details Patient Name: [name] Date of Service: 05/10/2015 8:00 AM Medical Record Number: [id number] Patient Account Number: [id number] Date of Birth/Sex: [birth year] (50 y.o. Male) Treating RN: Cornell Barman Primary Care Physician: [name] Other Clinician: Referring Physician: [name] Treating Physician/Extender: Frann Rider in Treatment: 17 Vital Signs Time Taken: 08:03 Temperature (F): 98.4 Height (in): 74 Pulse (bpm): 72 Weight (lbs): 288 Respiratory Rate (breaths/min): 18 Body Mass Index (BMI): 37 Blood Pressure (mmHg): 158/82 Capillary Blood Glucose (mg/dl): 164 Reference Range: 80 - 120 mg / dl Electronic Signature(s) Signed: 05/10/2015 8:04:03 AM By: Gretta Cool, RN, BSN, Kim RN, BSN Entered By: Gretta Cool, RN, BSN, Kim on 05/10/2015 08:04:03

## 2015-05-11 ENCOUNTER — Encounter: Payer: Managed Care, Other (non HMO) | Admitting: Internal Medicine

## 2015-05-12 ENCOUNTER — Encounter: Payer: Managed Care, Other (non HMO) | Admitting: Internal Medicine

## 2015-05-13 ENCOUNTER — Encounter: Payer: Managed Care, Other (non HMO) | Admitting: Surgery

## 2015-05-14 ENCOUNTER — Ambulatory Visit: Payer: Managed Care, Other (non HMO) | Admitting: Surgery

## 2015-05-14 ENCOUNTER — Encounter: Payer: Managed Care, Other (non HMO) | Admitting: Surgery

## 2015-05-25 ENCOUNTER — Telehealth: Payer: Self-pay | Admitting: Sports Medicine

## 2015-05-25 NOTE — Telephone Encounter (Signed)
Pt called in today May 23rd 2017 about his shoes wanting to know if we have received authorization from his PCP for his diabetic shoes

## 2015-06-09 ENCOUNTER — Ambulatory Visit (INDEPENDENT_AMBULATORY_CARE_PROVIDER_SITE_OTHER): Payer: Managed Care, Other (non HMO) | Admitting: *Deleted

## 2015-06-09 DIAGNOSIS — M86171 Other acute osteomyelitis, right ankle and foot: Secondary | ICD-10-CM

## 2015-06-09 DIAGNOSIS — E1159 Type 2 diabetes mellitus with other circulatory complications: Secondary | ICD-10-CM

## 2015-06-18 NOTE — Progress Notes (Signed)
Measured for diabetic shoes and insoles. 

## 2015-08-13 ENCOUNTER — Ambulatory Visit (INDEPENDENT_AMBULATORY_CARE_PROVIDER_SITE_OTHER): Payer: Managed Care, Other (non HMO) | Admitting: Sports Medicine

## 2015-08-13 ENCOUNTER — Encounter: Payer: Self-pay | Admitting: Sports Medicine

## 2015-08-13 DIAGNOSIS — E1159 Type 2 diabetes mellitus with other circulatory complications: Secondary | ICD-10-CM | POA: Diagnosis not present

## 2015-08-13 DIAGNOSIS — Z89421 Acquired absence of other right toe(s): Secondary | ICD-10-CM | POA: Diagnosis not present

## 2015-08-13 DIAGNOSIS — M79671 Pain in right foot: Secondary | ICD-10-CM

## 2015-08-13 NOTE — Progress Notes (Signed)
Patient discussed with medical assistant. Diabetic shoes and inserts dispensed. Patient to follow up as scheduled for continued care or sooner if problems or issues arise. -Dr. Cannon Kettle

## 2015-08-13 NOTE — Progress Notes (Signed)
Dispensed diabetic shoes and 3 pairs of insoles. Instructions were reviewed and a copy was given to the patient. Patient to reappointment for regularly scheduled diabetic foot care visits or if he experiences any trouble with his diabetic shoes.

## 2015-09-07 DIAGNOSIS — Z8371 Family history of colonic polyps: Secondary | ICD-10-CM | POA: Insufficient documentation

## 2015-09-13 ENCOUNTER — Encounter: Payer: Self-pay | Admitting: Gastroenterology

## 2015-09-13 ENCOUNTER — Encounter: Payer: Self-pay | Admitting: Internal Medicine

## 2015-11-18 ENCOUNTER — Encounter: Payer: Self-pay | Admitting: Gastroenterology

## 2015-11-18 ENCOUNTER — Ambulatory Visit: Payer: Managed Care, Other (non HMO) | Admitting: Internal Medicine

## 2015-11-18 ENCOUNTER — Ambulatory Visit (INDEPENDENT_AMBULATORY_CARE_PROVIDER_SITE_OTHER): Payer: Managed Care, Other (non HMO) | Admitting: Gastroenterology

## 2015-11-18 VITALS — BP 152/66 | HR 70 | Ht 74.0 in | Wt 304.2 lb

## 2015-11-18 DIAGNOSIS — Z1211 Encounter for screening for malignant neoplasm of colon: Secondary | ICD-10-CM | POA: Diagnosis not present

## 2015-11-18 DIAGNOSIS — Z800 Family history of malignant neoplasm of digestive organs: Secondary | ICD-10-CM

## 2015-11-18 MED ORDER — NA SULFATE-K SULFATE-MG SULF 17.5-3.13-1.6 GM/177ML PO SOLN: ORAL | 0 refills | Status: DC

## 2015-11-18 NOTE — Patient Instructions (Addendum)
You have been scheduled for a colonoscopy. Please follow written instructions given to you at your visit today.  Please pick up your prep supplies at the pharmacy within the next 1-3 days. If you use inhalers (even only as needed), please bring them with you on the day of your procedure. Your physician has requested that you go to www.startemmi.com and enter the access code given to you at your visit today. This web site gives a general overview about your procedure. However, you should still follow specific instructions given to you by our office regarding your preparation for the procedure.  _x  _   ORAL DIABETIC MEDICATION INSTRUCTIONS  The day before your procedure:  Take your diabetic pill as you do normally  The day of your procedure:  Do not take your diabetic pill   We will check your blood sugar levels during the admission process and again in Recovery before discharging you home  ______________________________________________________________________  _  _   INSULIN (LONG ACTING) MEDICATION INSTRUCTIONS Tyler Aas )   The day before your procedure:  Take  your regular evening dose    The day of your procedure:  Do not take your morning dose   If you are age 44 or older, your body mass index should be between 23-30. Your Body mass index is 39.06 kg/m. If this is out of the aforementioned range listed, please consider follow up with your Primary Care Provider.  If you are age 60 or younger, your body mass index should be between 19-25. Your Body mass index is 39.06 kg/m. If this is out of the aformentioned range listed, please consider follow up with your Primary Care Provider.

## 2015-11-18 NOTE — Progress Notes (Signed)
HPI :  50 y/o male with a history of diabetes hypertension hyperlipidemia and osteomyelitis here for a new patient visit to discuss colon cancer screening.  He reports father had colon cancer at age 32 or so. He reports never having had a prior colonoscopy. He denies any blood in the stools. No bowel habits changes, no diarrhea or constipation. No abdominal pains. No weight loss. He denies any history of anemia. He otherwise feels well without complaints. Osteomyelitis noted in March which led to amputation of the toe, this issue has since resolved.     Past Medical History:  Diagnosis Date  . Broken ankle   . Broken arm   . Diabetes mellitus without complication ([place])   . History of Bell's palsy   . Hyperlipidemia   . Hypertension   . Osteomyelitis [facility])     Past Surgical History:  Procedure Laterality Date  . AMPUTATION Right 03/04/2015   Procedure: PARTIAL AMPUTATION RIGHT 5TH METATARSAL;  Surgeon: Landis Martins, DPM;  Location: [place];  Service: Podiatry;  Laterality: Right;  . APPENDECTOMY    . I&D EXTREMITY Right 03/04/2015   Procedure: IRRIGATION AND DEBRIDEMENT RIGHT FOOT;  Surgeon: Landis Martins, DPM;  Location: [place];  Service: Podiatry;  Laterality: Right;   Family History  Problem Relation Age of Onset  . Diabetes Father   . Colon cancer Father    Social History  Substance Use Topics  . Smoking status: Never Smoker  . Smokeless tobacco: Never Used  . Alcohol use No   Current Outpatient Prescriptions  Medication Sig Dispense Refill  . acetaminophen (TYLENOL) 325 MG tablet Take 2 tablets (650 mg total) by mouth every 6 (six) hours as needed for mild pain or headache.    Marland Kitchen aspirin EC 81 MG tablet Take 81 mg by mouth daily.    Marland Kitchen glimepiride (AMARYL) 2 MG tablet Take 2 mg by mouth 2 (two) times daily.    . metFORMIN (GLUCOPHAGE) 1000 MG tablet Take 1,000 mg by mouth 2 (two) times daily.    . Multiple Vitamin (MULTIVITAMIN) tablet Take 1 tablet by mouth daily.    .  Olmesartan-Amlodipine-HCTZ 40-10-25 MG TABS Take 1 tablet by mouth daily. Reported on 02/04/2015    . omega-3 acid ethyl esters (LOVAZA) 1 g capsule Take 2 g by mouth daily.    . ondansetron (ZOFRAN) 4 MG tablet Take 1 tablet (4 mg total) by mouth every 6 (six) hours as needed for nausea. 20 tablet 0  . TRESIBA FLEXTOUCH 200 UNIT/ML SOPN Inject 100 Units into the skin daily.     No current facility-administered medications for this visit.    No Known Allergies   Review of Systems: All systems reviewed and negative except where noted in HPI.     Physical Exam: BP (!) 152/66   Pulse 70   Ht 6\' 2"  (1.88 m)   Wt (!) 304 lb 4 oz (138 kg)   BMI 39.06 kg/m  Constitutional: Pleasant,well-developed, male in no acute distress. HEENT: Normocephalic and atraumatic. Conjunctivae are normal. No scleral icterus. Neck supple.  Cardiovascular: Normal rate, regular rhythm.  Pulmonary/chest: Effort normal and breath sounds normal. No wheezing, rales or rhonchi. Abdominal: Soft, protuberant, nontender.  There are no masses palpable. No hepatomegaly. Extremities: no edema Lymphadenopathy: No cervical adenopathy noted. Neurological: Alert and oriented to person place and time. Skin: Skin is warm and dry. No rashes noted. Psychiatric: Normal mood and affect. Behavior is normal.   ASSESSMENT AND PLAN: 50 year old male  with past medical history as outlined above, with strong family history of colon cancer, here for his first time colon cancer screening for which he is overdue given his family history. He otherwise has no alarm symptoms and is asymptomatic. I'm recommending optical colonoscopy given his history. I discussed risks and benefits of colonoscopy and anesthesia with him. Following this discussion he wished to proceed. Further recommendations pending the results of this exam  McKittrick Cellar, MD [facility] Pager [phone]  CC: [name], MD

## 2015-12-24 ENCOUNTER — Encounter: Payer: Self-pay | Admitting: Gastroenterology

## 2015-12-24 ENCOUNTER — Ambulatory Visit (AMBULATORY_SURGERY_CENTER): Payer: Managed Care, Other (non HMO) | Admitting: Gastroenterology

## 2015-12-24 VITALS — BP 124/74 | HR 68 | Temp 98.9°F | Resp 16 | Ht 74.0 in | Wt 304.0 lb

## 2015-12-24 DIAGNOSIS — D125 Benign neoplasm of sigmoid colon: Secondary | ICD-10-CM

## 2015-12-24 DIAGNOSIS — D123 Benign neoplasm of transverse colon: Secondary | ICD-10-CM

## 2015-12-24 DIAGNOSIS — D122 Benign neoplasm of ascending colon: Secondary | ICD-10-CM | POA: Diagnosis not present

## 2015-12-24 DIAGNOSIS — Z1212 Encounter for screening for malignant neoplasm of rectum: Secondary | ICD-10-CM

## 2015-12-24 DIAGNOSIS — D124 Benign neoplasm of descending colon: Secondary | ICD-10-CM | POA: Diagnosis not present

## 2015-12-24 DIAGNOSIS — Z800 Family history of malignant neoplasm of digestive organs: Secondary | ICD-10-CM | POA: Diagnosis not present

## 2015-12-24 DIAGNOSIS — D127 Benign neoplasm of rectosigmoid junction: Secondary | ICD-10-CM | POA: Diagnosis not present

## 2015-12-24 DIAGNOSIS — Z1211 Encounter for screening for malignant neoplasm of colon: Secondary | ICD-10-CM | POA: Diagnosis not present

## 2015-12-24 DIAGNOSIS — D120 Benign neoplasm of cecum: Secondary | ICD-10-CM

## 2015-12-24 LAB — GLUCOSE, CAPILLARY
Glucose-Capillary: 129 mg/dL — ABNORMAL HIGH (ref 65–99)
Glucose-Capillary: 127 mg/dL — ABNORMAL HIGH (ref 65–99)

## 2015-12-24 MED ORDER — SODIUM CHLORIDE 0.9 % IV SOLN: 500.0000 mL | INTRAVENOUS | Status: DC

## 2015-12-24 NOTE — Op Note (Signed)
[place] Patient Name: [name] Procedure Date: 12/24/2015 10:10 AM MRN: [id number] Endoscopist: Remo Lipps P. Katheleen Stella MD, MD Age: 50 Referring MD:  Date of Birth: [birth year] Gender: Male Account #: [id number] Procedure:                Colonoscopy Indications:              Screening in patient at increased risk: Family                            history of 1st-degree relative with colorectal                            cancer - father age 49, This is the patient's first                            colonoscopy Medicines:                Monitored Anesthesia Care Procedure:                Pre-Anesthesia Assessment:                           - Prior to the procedure, a History and Physical                            was performed, and patient medications and                            allergies were reviewed. The patient's tolerance of                            previous anesthesia was also reviewed. The risks                            and benefits of the procedure and the sedation                            options and risks were discussed with the patient.                            All questions were answered, and informed consent                            was obtained. Prior Anticoagulants: The patient has                            taken aspirin, last dose was 1 day prior to                            procedure. ASA Grade Assessment: II - A patient                            with mild systemic disease. After reviewing the  risks and benefits, the patient was deemed in                            satisfactory condition to undergo the procedure.                           After obtaining informed consent, the colonoscope                            was passed under direct vision. Throughout the                            procedure, the patient's blood pressure, pulse, and                            oxygen saturations were monitored  continuously. The                            Model CF-HQ190L [id number]) scope was introduced                            through the anus and advanced to the the cecum,                            identified by appendiceal orifice and ileocecal                            valve. The colonoscopy was performed without                            difficulty. The patient tolerated the procedure                            well. The quality of the bowel preparation was                            good. The ileocecal valve, appendiceal orifice, and                            rectum were photographed. Scope In: 10:11:32 AM Scope Out: 10:53:05 AM Scope Withdrawal Time: 0 hours 38 minutes 31 seconds  Total Procedure Duration: 0 hours 41 minutes 33 seconds  Findings:                 The perianal and digital rectal examinations were                            normal.                           A 6 mm polyp was found in the cecum. The polyp was                            sessile. The polyp was removed with a cold snare.  Resection and retrieval were complete.                           A 3 mm polyp was found in the ileocecal valve. The                            polyp was sessile. The polyp was removed with a                            cold snare. Resection and retrieval were complete.                           A roughly 20-25 mm polyp was found in the ascending                            colon. The polyp was semi-pedunculated. The polyp                            was removed with a hot snare in one piece.                            Resection and retrieval were complete. Area was                            tattooed with an injection of Spot (carbon black).                           A 6 mm polyp was found in the ascending colon. The                            polyp was sessile. The polyp was removed with a                            cold snare. Resection and retrieval were  complete.                           Four sessile polyps were found in the hepatic                            flexure. The polyps were 5 to 8 mm in size. These                            polyps were removed with a cold snare. Resection                            and retrieval were complete.                           Three sessile polyps were found in the transverse                            colon. The polyps were 4 to  6 mm in size. These                            polyps were removed with a cold snare. Resection                            and retrieval were complete.                           A 5 mm polyp was found in the splenic flexure. The                            polyp was sessile. The polyp was removed with a                            cold snare. Resection and retrieval were complete.                           A 5 mm polyp was found in the descending colon. The                            polyp was sessile. The polyp was removed with a                            cold snare. Resection and retrieval were complete.                           Two sessile polyps were found in the sigmoid colon.                            The polyps were 3 to 5 mm in size. These polyps                            were removed with a cold snare. Resection and                            retrieval were complete.                           A 4 mm polyp was found in the recto-sigmoid colon.                            The polyp was sessile. The polyp was removed with a                            cold snare. Resection and retrieval were complete.                           There was spasm in the entire colon which prolonged                            the procedure.  Internal hemorrhoids were found during retroflexion.                           The exam was otherwise without abnormality. Complications:            No immediate complications. Estimated blood loss:                             Minimal. Estimated Blood Loss:     Estimated blood loss was minimal. Impression:               - One 6 mm polyp in the cecum, removed with a cold                            snare. Resected and retrieved.                           - One 3 mm polyp at the ileocecal valve, removed                            with a cold snare. Resected and retrieved.                           - One 20-25 mm polyp in the ascending colon,                            removed with a hot snare. Resected and retrieved.                            Tattooed.                           - One 6 mm polyp in the ascending colon, removed                            with a cold snare. Resected and retrieved.                           - Four 5 to 8 mm polyps at the hepatic flexure,                            removed with a cold snare. Resected and retrieved.                           - Three 4 to 6 mm polyps in the transverse colon,                            removed with a cold snare. Resected and retrieved.                           - One 5 mm polyp at the splenic flexure, removed  with a cold snare. Resected and retrieved.                           - One 5 mm polyp in the descending colon, removed                            with a cold snare. Resected and retrieved.                           - Two 3 to 5 mm polyps in the sigmoid colon,                            removed with a cold snare. Resected and retrieved.                           - One 4 mm polyp at the recto-sigmoid colon,                            removed with a cold snare. Resected and retrieved.                           - Colonic spasm.                           - Internal hemorrhoids.                           - The examination was otherwise normal. Recommendation:           - Patient has a contact number available for                            emergencies. The signs and symptoms of potential                            delayed  complications were discussed with the                            patient. Return to normal activities tomorrow.                            Written discharge instructions were provided to the                            patient.                           - Resume previous diet.                           - Continue present medications.                           - No aspirin, ibuprofen, naproxen, or other  non-steroidal anti-inflammatory drugs for 2 weeks                            after polyp removal.                           - Await pathology results.                           - Repeat colonoscopy is recommended for                            surveillance. The colonoscopy date will be                            determined after pathology results from today's                            exam become available for review. Remo Lipps P. Sherle Mello MD, MD 12/24/2015 11:02:23 AM This report has been signed electronically.

## 2015-12-24 NOTE — Progress Notes (Signed)
A/ox3 pleased with MAC, report to Michelle RN 

## 2015-12-24 NOTE — Patient Instructions (Signed)
YOU HAD AN ENDOSCOPIC PROCEDURE TODAY AT [facility]:   Refer to the procedure report that was given to you for any specific questions about what was found during the examination.  If the procedure report does not answer your questions, please call your gastroenterologist to clarify.  If you requested that your care partner not be given the details of your procedure findings, then the procedure report has been included in a sealed envelope for you to review at your convenience later.  YOU SHOULD EXPECT: Some feelings of bloating in the abdomen. Passage of more gas than usual.  Walking can help get rid of the air that was put into your GI tract during the procedure and reduce the bloating. If you had a lower endoscopy (such as a colonoscopy or flexible sigmoidoscopy) you may notice spotting of blood in your stool or on the toilet paper. If you underwent a bowel prep for your procedure, you may not have a normal bowel movement for a few days.  Please Note:  You might notice some irritation and congestion in your nose or some drainage.  This is from the oxygen used during your procedure.  There is no need for concern and it should clear up in a day or so.  SYMPTOMS TO REPORT IMMEDIATELY:   Following lower endoscopy (colonoscopy or flexible sigmoidoscopy):  Excessive amounts of blood in the stool  Significant tenderness or worsening of abdominal pains  Swelling of the abdomen that is new, acute  Fever of 100F or higher  For urgent or emergent issues, a gastroenterologist can be reached at any hour by calling [phone].   DIET:  We do recommend a small meal at first, but then you may proceed to your regular diet.  Drink plenty of fluids but you should avoid alcoholic beverages for 24 hours.  ACTIVITY:  You should plan to take it easy for the rest of today and you should NOT DRIVE or use heavy machinery until tomorrow (because of the sedation medicines used during the test).     FOLLOW UP: Our staff will call the number listed on your records the next business day following your procedure to check on you and address any questions or concerns that you may have regarding the information given to you following your procedure. If we do not reach you, we will leave a message.  However, if you are feeling well and you are not experiencing any problems, there is no need to return our call.  We will assume that you have returned to your regular daily activities without incident.  If any biopsies were taken you will be contacted by phone or by letter within the next 1-3 weeks.  Please call us at [phone] if you have not heard about the biopsies in 3 weeks.    Await biopsy results to determined next repeat Colonoscopy Polyps (handout given) No Ibuprofen, Naproxen, or other non-steriodal ant-inflammatory drugs for 2 weeks after polyps removal.   SIGNATURES/CONFIDENTIALITY: You and/or your care partner have signed paperwork which will be entered into your electronic medical record.  These signatures attest to the fact that that the information above on your After Visit Summary has been reviewed and is understood.  Full responsibility of the confidentiality of this discharge information lies with you and/or your care-partner.

## 2015-12-28 ENCOUNTER — Telehealth: Payer: Self-pay | Admitting: *Deleted

## 2015-12-28 NOTE — Telephone Encounter (Signed)
  Follow up Call-  Call back number 12/24/2015  Post procedure Call Back phone  # [phone]  Permission to leave phone message Yes  Some recent data might be hidden     Patient questions:  Do you have a fever, pain , or abdominal swelling? No. Pain Score  0 *  Have you tolerated food without any problems? Yes.    Have you been able to return to your normal activities? Yes.    Do you have any questions about your discharge instructions: Diet   No. Medications  No. Follow up visit  No.  Do you have questions or concerns about your Care? No.  Actions: * If pain score is 4 or above: No action needed, pain <4.

## 2016-01-05 ENCOUNTER — Encounter: Payer: Self-pay | Admitting: Gastroenterology

## 2016-03-27 IMAGING — US US EXTREM LOW VENOUS*R*
1 series · 13 of 24 positions shown · non-contrast
Comparison: None.

CLINICAL DATA: 50-year-old male with right lower extremity edema
and right calf pain



[Series 2: us extrem low venous*right* · 0.08mm/px · 13 of 34 slices shown]
[im 1/34]
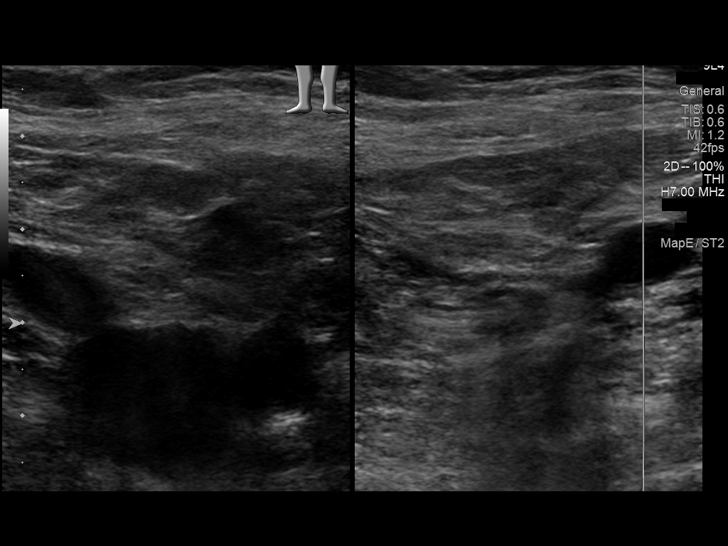
[im 3/34]
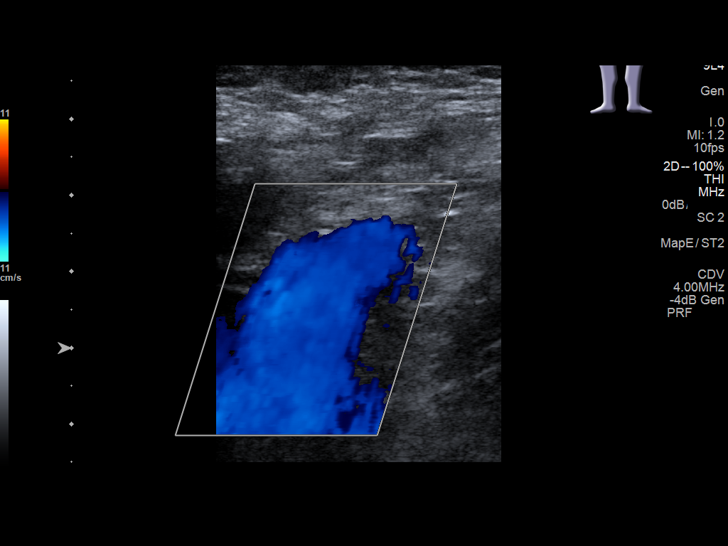
[im 6/34]
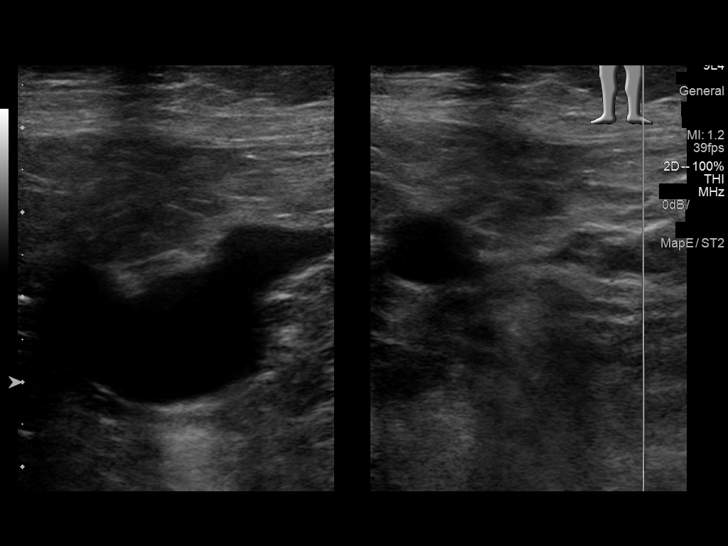
[im 9/34]
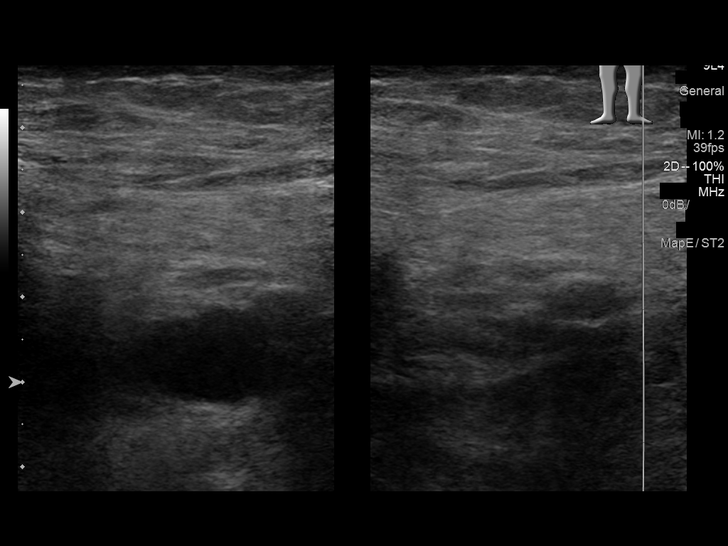
[im 12/34]
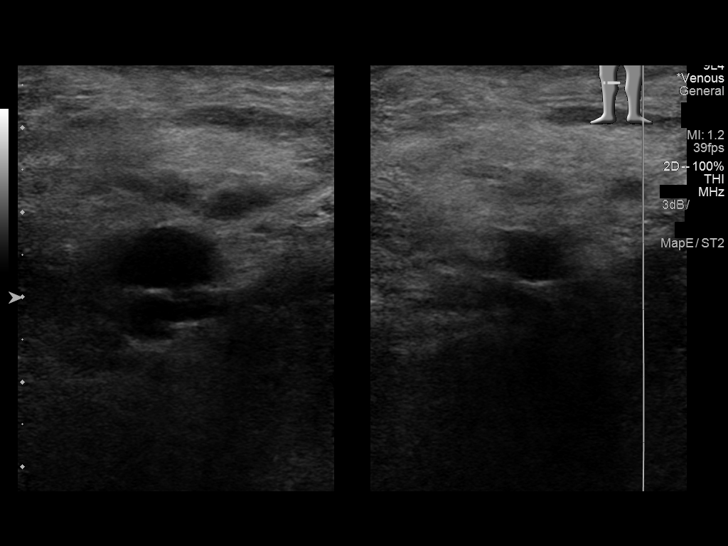
[im 15/34]
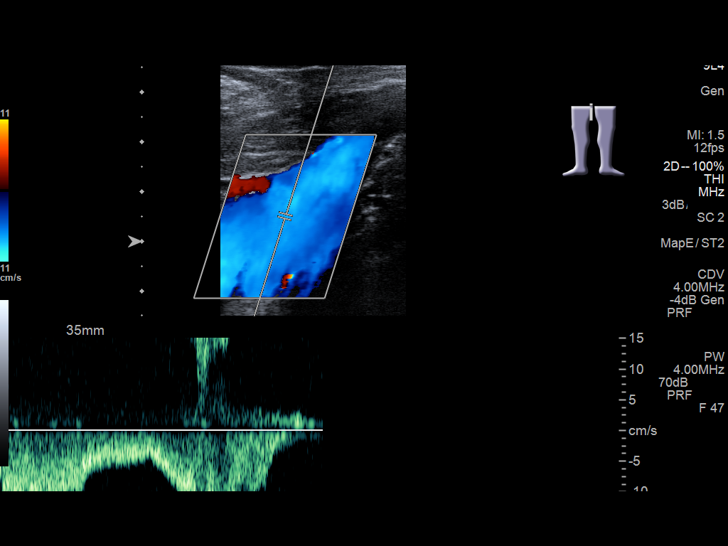
[im 18/34]
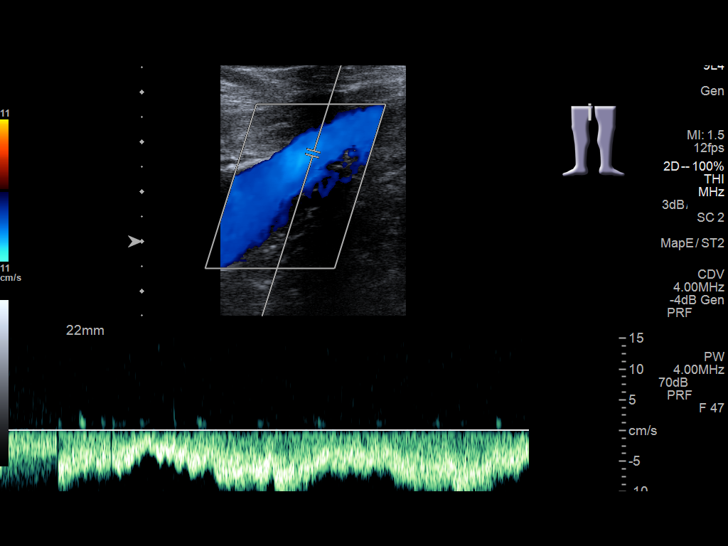
[im 19/34]
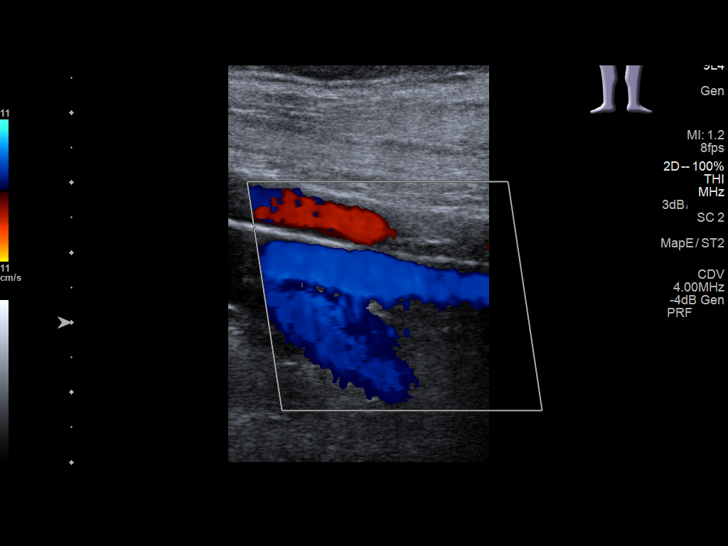
[im 22/34]
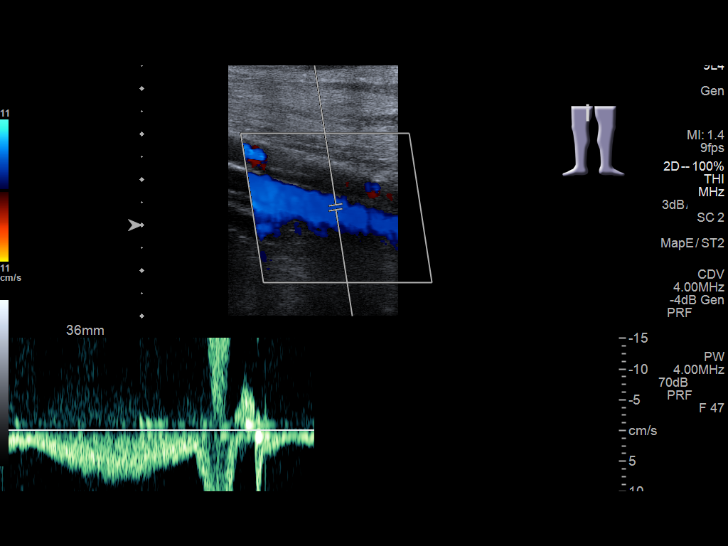
[im 25/34]
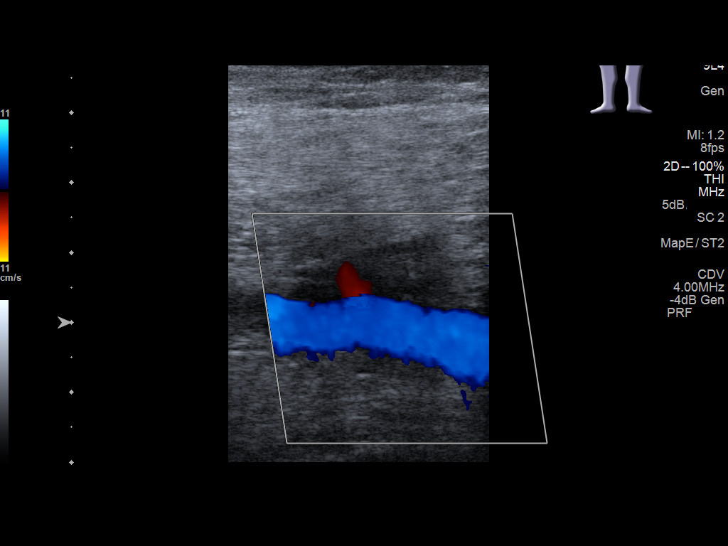
[im 28/34]
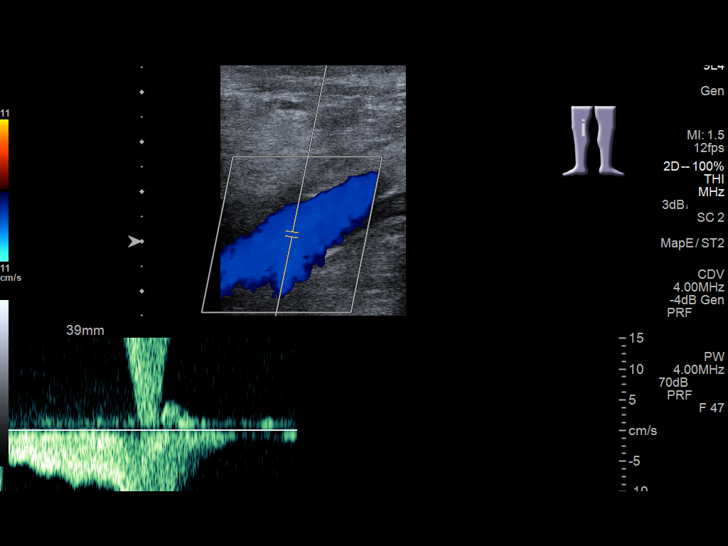
[im 31/34]
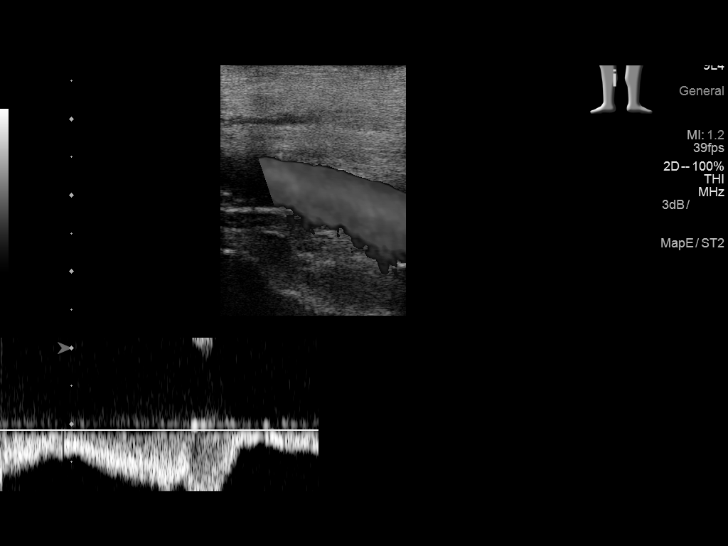
[im 34/34]
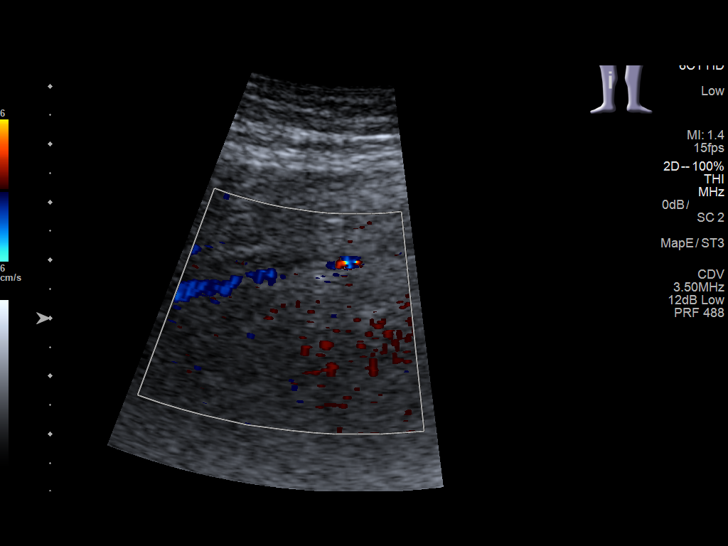

[13 of 24 positions shown; findings below may reference images not displayed]

FINDINGS: Contralateral Common Femoral Vein: Respiratory phasicity is normal
and symmetric with the symptomatic side. No evidence of thrombus.
Normal compressibility.

Common Femoral Vein: No evidence of thrombus. Normal
compressibility, respiratory phasicity and response to augmentation.

Saphenofemoral Junction: No evidence of thrombus. Normal
compressibility and flow on color Doppler imaging.

Profunda Femoral Vein: No evidence of thrombus. Normal
compressibility and flow on color Doppler imaging.

Femoral Vein: No evidence of thrombus. Normal compressibility,
respiratory phasicity and response to augmentation.

Popliteal Vein: No evidence of thrombus. Normal compressibility,
respiratory phasicity and response to augmentation.

Calf Veins: No evidence of thrombus. Normal compressibility and flow
on color Doppler imaging.

Superficial Great Saphenous Vein: No evidence of thrombus. Normal
compressibility and flow on color Doppler imaging.

Venous Reflux:  None.

Other Findings:  None.
IMPRESSION: No evidence of deep venous thrombosis.

## 2016-05-22 ENCOUNTER — Emergency Department (HOSPITAL_COMMUNITY)
Admission: EM | Admit: 2016-05-22 | Discharge: 2016-05-22 | Disposition: A | Discharge status: Home / Self Care | Payer: Managed Care, Other (non HMO) | Attending: Emergency Medicine | Admitting: Emergency Medicine

## 2016-05-22 ENCOUNTER — Emergency Department (HOSPITAL_COMMUNITY): Payer: Managed Care, Other (non HMO)

## 2016-05-22 ENCOUNTER — Encounter (HOSPITAL_COMMUNITY): Payer: Self-pay | Admitting: Emergency Medicine

## 2016-05-22 DIAGNOSIS — Z7982 Long term (current) use of aspirin: Secondary | ICD-10-CM | POA: Diagnosis not present

## 2016-05-22 DIAGNOSIS — I10 Essential (primary) hypertension: Secondary | ICD-10-CM | POA: Insufficient documentation

## 2016-05-22 DIAGNOSIS — Z794 Long term (current) use of insulin: Secondary | ICD-10-CM | POA: Diagnosis not present

## 2016-05-22 DIAGNOSIS — Z79899 Other long term (current) drug therapy: Secondary | ICD-10-CM | POA: Diagnosis not present

## 2016-05-22 DIAGNOSIS — R0789 Other chest pain: Secondary | ICD-10-CM | POA: Insufficient documentation

## 2016-05-22 DIAGNOSIS — E119 Type 2 diabetes mellitus without complications: Secondary | ICD-10-CM | POA: Diagnosis not present

## 2016-05-22 LAB — BASIC METABOLIC PANEL
Anion gap: 10 (ref 5–15)
BUN: 21 mg/dL — ABNORMAL HIGH (ref 6–20)
CO2: 22 mmol/L (ref 22–32)
Calcium: 9.1 mg/dL (ref 8.9–10.3)
Chloride: 103 mmol/L (ref 101–111)
Creatinine, Ser: 0.87 mg/dL (ref 0.61–1.24)
GFR calc Af Amer: >60 mL/min (ref >60)
GFR calc non Af Amer: >60 mL/min (ref >60)
Glucose, Bld: 217 mg/dL — ABNORMAL HIGH (ref 65–99)
Potassium: 4.2 mmol/L (ref 3.5–5.1)
Sodium: 135 mmol/L (ref 135–145)

## 2016-05-22 LAB — CBC
HCT: 42.6 % (ref 39.0–52.0)
Hemoglobin: 14.8 g/dL (ref 13.0–17.0)
MCH: 30.3 pg (ref 26.0–34.0)
MCHC: 34.7 g/dL (ref 30.0–36.0)
MCV: 87.3 fL (ref 78.0–100.0)
Platelets: 249 10*3/uL (ref 150–400)
RBC: 4.88 MIL/uL (ref 4.22–5.81)
RDW: 12.8 % (ref 11.5–15.5)
WBC: 10.8 10*3/uL — ABNORMAL HIGH (ref 4.0–10.5)

## 2016-05-22 LAB — I-STAT TROPONIN, ED
Troponin i, poc: 0 ng/mL (ref 0.00–0.08)
Troponin i, poc: 0 ng/mL (ref 0.00–0.08)

## 2016-05-22 NOTE — ED Notes (Signed)
ED Provider at bedside. 

## 2016-05-22 NOTE — Discharge Instructions (Signed)
Please return if here chest pain worsens, becomes associated with shortness of breath, palpitations, feeling lightheaded or if you have any other concerns. Please follow-up with the cardiologists and your primary care doctor.

## 2016-05-22 NOTE — ED Provider Notes (Signed)
[facility] DEPT Provider Note   CSN: [id number] Arrival date & time: 05/22/16  1405     History   Chief Complaint Chief Complaint  Patient presents with  . Chest Pain    HPI [name] is a 51 y.o. male with a history of DM2, HTN, HLD, osteomyelitis of the foot who presents with three days "dull" of substernal chest pressure that radiates to the left shoulder.  His pain is currently a 1-2/10.  He reports the pain has been staying the same.  Pain started while he was at work.  Denies nausea, Vomiting, diaphoresis, abdominal pain, shortness of breath, feeling lightheaded.  He has not found any alleviating or aggravating factors.  No recent change to exercise tolerance. He states that he sleeps in a recliner every night for his back, not because of shortness of breath. He denies PND, fevers, chills, coughs or congestion.  He denies recent palpitations, leg swelling or changes to bowel or bladder function.    HPI  Past Medical History:  Diagnosis Date  . Broken ankle   . Broken arm   . Diabetes mellitus without complication ([place]   . History of Bell's palsy   . Hyperlipidemia   . Hypertension   . Osteomyelitis [facility])     Patient Active Problem List   Diagnosis Date Noted  . Influenza A 03/09/2015  . Fever, unspecified 03/08/2015  . Nausea and vomiting 03/08/2015  . Hyperlipidemia 03/04/2015  . Osteomyelitis ([place]) 03/04/2015  . Edema leg 02/26/2015  . Pain of right lower leg 02/26/2015  . 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[place]) 02/19/2015  . Diabetes mellitus ([place]) 02/10/2015  . Foot osteomyelitis, right ([place]) 02/04/2015  . Type II diabetes mellitus ([place]) 07/10/2007  . HYPERTENSION 07/10/2007    Past Surgical History:  Procedure Laterality Date  . AMPUTATION Right 03/04/2015   Procedure: PARTIAL AMPUTATION RIGHT 5TH METATARSAL;  Surgeon: Landis Martins, DPM;  Location: [facility];  Service: Podiatry;  Laterality: Right;  . APPENDECTOMY    . I&D EXTREMITY Right  03/04/2015   Procedure: IRRIGATION AND DEBRIDEMENT RIGHT FOOT;  Surgeon: Landis Martins, DPM;  Location: [facility];  Service: Podiatry;  Laterality: Right;       Home Medications    Prior to Admission medications   Medication Sig Start Date End Date Taking? Authorizing Provider  aspirin EC 81 MG tablet Take 81 mg by mouth daily.   Yes [provider]  fluticasone (FLONASE ALLERGY RELIEF) 50 MCG/ACT nasal spray Place 2 sprays into both nostrils daily as needed for allergies or rhinitis.   Yes [provider]  glimepiride (AMARYL) 2 MG tablet Take 2 mg by mouth 2 (two) times daily. 10/26/14  Yes [provider]  Insulin Degludec (TRESIBA FLEXTOUCH) 200 UNIT/ML SOPN Inject 100 Units into the skin daily before breakfast.   Yes [provider]  metFORMIN (GLUCOPHAGE) 1000 MG tablet Take 1,000 mg by mouth 2 (two) times daily. 11/09/14  Yes [provider]  Multiple Vitamin (MULTIVITAMIN) tablet Take 1 tablet by mouth daily.   Yes [provider]  Olmesartan-Amlodipine-HCTZ 40-10-25 MG TABS Take 1 tablet by mouth daily. Reported on 02/04/2015 12/28/14  Yes [provider]  omega-3 acid ethyl esters (LOVAZA) 1 g capsule Take 1 capsule by mouth 2 (two) times daily.   Yes [provider]  acetaminophen (TYLENOL) 325 MG tablet Take 2 tablets (650 mg total) by mouth every 6 (six) hours as needed for mild pain or headache. Patient not taking:  Reported on 05/22/2016 03/08/15   Rama, Venetia Maxon, MD  ondansetron (ZOFRAN) 4 MG tablet Take 1 tablet (4 mg total) by mouth every 6 (six) hours as needed for nausea. Patient not taking: Reported on 05/22/2016 03/08/15   Rama, Venetia Maxon, MD    Family History Family History  Problem Relation Age of Onset  . Diabetes Father   . Colon cancer Father   . Colon polyps Father   . Esophageal cancer Neg Hx   . Rectal cancer Neg Hx   . Stomach cancer Neg Hx     Social History Social History  Substance  Use Topics  . Smoking status: Never Smoker  . Smokeless tobacco: Never Used  . Alcohol use No     Allergies   Patient has no known allergies.   Review of Systems Review of Systems  Constitutional: Negative for chills, fatigue and fever.  HENT: Negative for congestion, postnasal drip and sore throat.   Eyes: Negative for pain and visual disturbance.  Respiratory: Negative for cough, chest tightness and shortness of breath.   Cardiovascular: Positive for chest pain. Negative for palpitations and leg swelling.  Gastrointestinal: Negative for abdominal distention, abdominal pain, diarrhea, nausea and vomiting.  Genitourinary: Negative for difficulty urinating, flank pain, hematuria and urgency.  Musculoskeletal: Negative for arthralgias, myalgias, neck pain and neck stiffness.  Skin: Negative for color change and pallor.  Neurological: Negative for tremors, syncope, light-headedness and numbness.     Physical Exam Updated Vital Signs BP 136/76   Pulse 75   Temp 98.6 F (37 C) (Oral)   Resp 12   SpO2 99%   Physical Exam  Constitutional: He appears well-developed and well-nourished.  HENT:  Head: Normocephalic and atraumatic.  Eyes: Conjunctivae are normal. No scleral icterus.  Neck: Normal range of motion. Neck supple. No JVD present.  Cardiovascular: Normal rate, regular rhythm, normal heart sounds and intact distal pulses.  Exam reveals no gallop and no friction rub.   No murmur heard. Pulmonary/Chest: Effort normal and breath sounds normal. No stridor. No respiratory distress. He has no wheezes. He has no rales.  Abdominal: Soft. Bowel sounds are normal. He exhibits no distension. There is no tenderness. There is no guarding.  Musculoskeletal: Normal range of motion. He exhibits no edema or deformity.  Neurological: He is alert. He exhibits normal muscle tone.  Skin: Skin is warm and dry. No rash noted. He is not diaphoretic. No pallor.  Psychiatric: He has a normal mood  and affect. His behavior is normal.  Nursing note and vitals reviewed.    ED Treatments / Results  Labs (all labs ordered are listed, but only abnormal results are displayed) Labs Reviewed  BASIC METABOLIC PANEL - Abnormal; Notable for the following:       Result Value   Glucose, Bld 217 (*)    BUN 21 (*)    All other components within normal limits  CBC - Abnormal; Notable for the following:    WBC 10.8 (*)    All other components within normal limits  I-STAT TROPOININ, ED  I-STAT TROPOININ, ED    EKG  EKG Interpretation  Date/Time:  Monday May 22 2016 18:32:39 EDT Ventricular Rate:  75 PR Interval:    QRS Duration: 105 QT Interval:  359 QTC Calculation: 401 R Axis:   -6 Text Interpretation:  Sinus rhythm Multiple ventricular premature complexes Low voltage, precordial leads Consider anterior infarct No significant change since last tracing Confirmed by Varney Biles [phone]) on 05/22/2016  6:52:08 PM       Radiology Dg Chest 2 View  Result Date: 05/22/2016 CLINICAL DATA:  Chest pain for several days EXAM: CHEST  2 VIEW COMPARISON:  03/10/2015 FINDINGS: The heart size and mediastinal contours are within normal limits. Both lungs are clear. The visualized skeletal structures are unremarkable. IMPRESSION: No active cardiopulmonary disease. Electronically Signed   By: Inez Catalina M.D.   On: 05/22/2016 14:57    Procedures Procedures (including critical care time)  Medications Ordered in ED Medications - No data to display   Initial Impression / Assessment and Plan / ED Course  I have reviewed the triage vital signs and the nursing notes.  Pertinent labs & imaging results that were available during my care of the patient were reviewed by me and considered in my medical decision making (see chart for details).    Patient is to be discharged with recommendation to follow up with PCP and cardiologist in regards to today's hospital visit. Chest pain is not likely of  cardiac or pulmonary etiology d/t presentation, , VSS, no tracheal deviation, no JVD or new murmur, RRR, breath sounds equal bilaterally, EKG without acute abnormalities, negative troponin x2, and negative CXR. Patient does not have significiant risks for PE/DVT (including no recent long travel, no estrogen use, recent trauma or surgery). Pt has been advised to return to the ED if CP becomes exertional, associated with diaphoresis or nausea, radiates to left jaw/arm, worsens or becomes concerning in any way. Pt appears reliable for follow up and is agreeable to discharge.   Case has been discussed with and seen by Dr. Kathrynn Humble who agrees with the above plan to discharge.     Final Clinical Impressions(s) / ED Diagnoses   Final diagnoses:  Atypical chest pain    New Prescriptions Discharge Medication List as of 05/22/2016  9:00 PM       Lorin Glass, PA-C 05/23/16 5009    Varney Biles, MD 05/24/16 1816

## 2016-05-22 NOTE — ED Triage Notes (Signed)
Pt sts left sided CP x 3 days; pt denies SOB

## 2017-02-18 IMAGING — DX DG FOOT 2V*R*
2 series · 2 of 2 positions shown · non-contrast
Comparison: None.

CLINICAL DATA: Ulceration at the dorsal distal aspect of the right
fifth metatarsal. Initial encounter.

EXAM:
RIGHT FOOT - 2 VIEW

[foot ap]
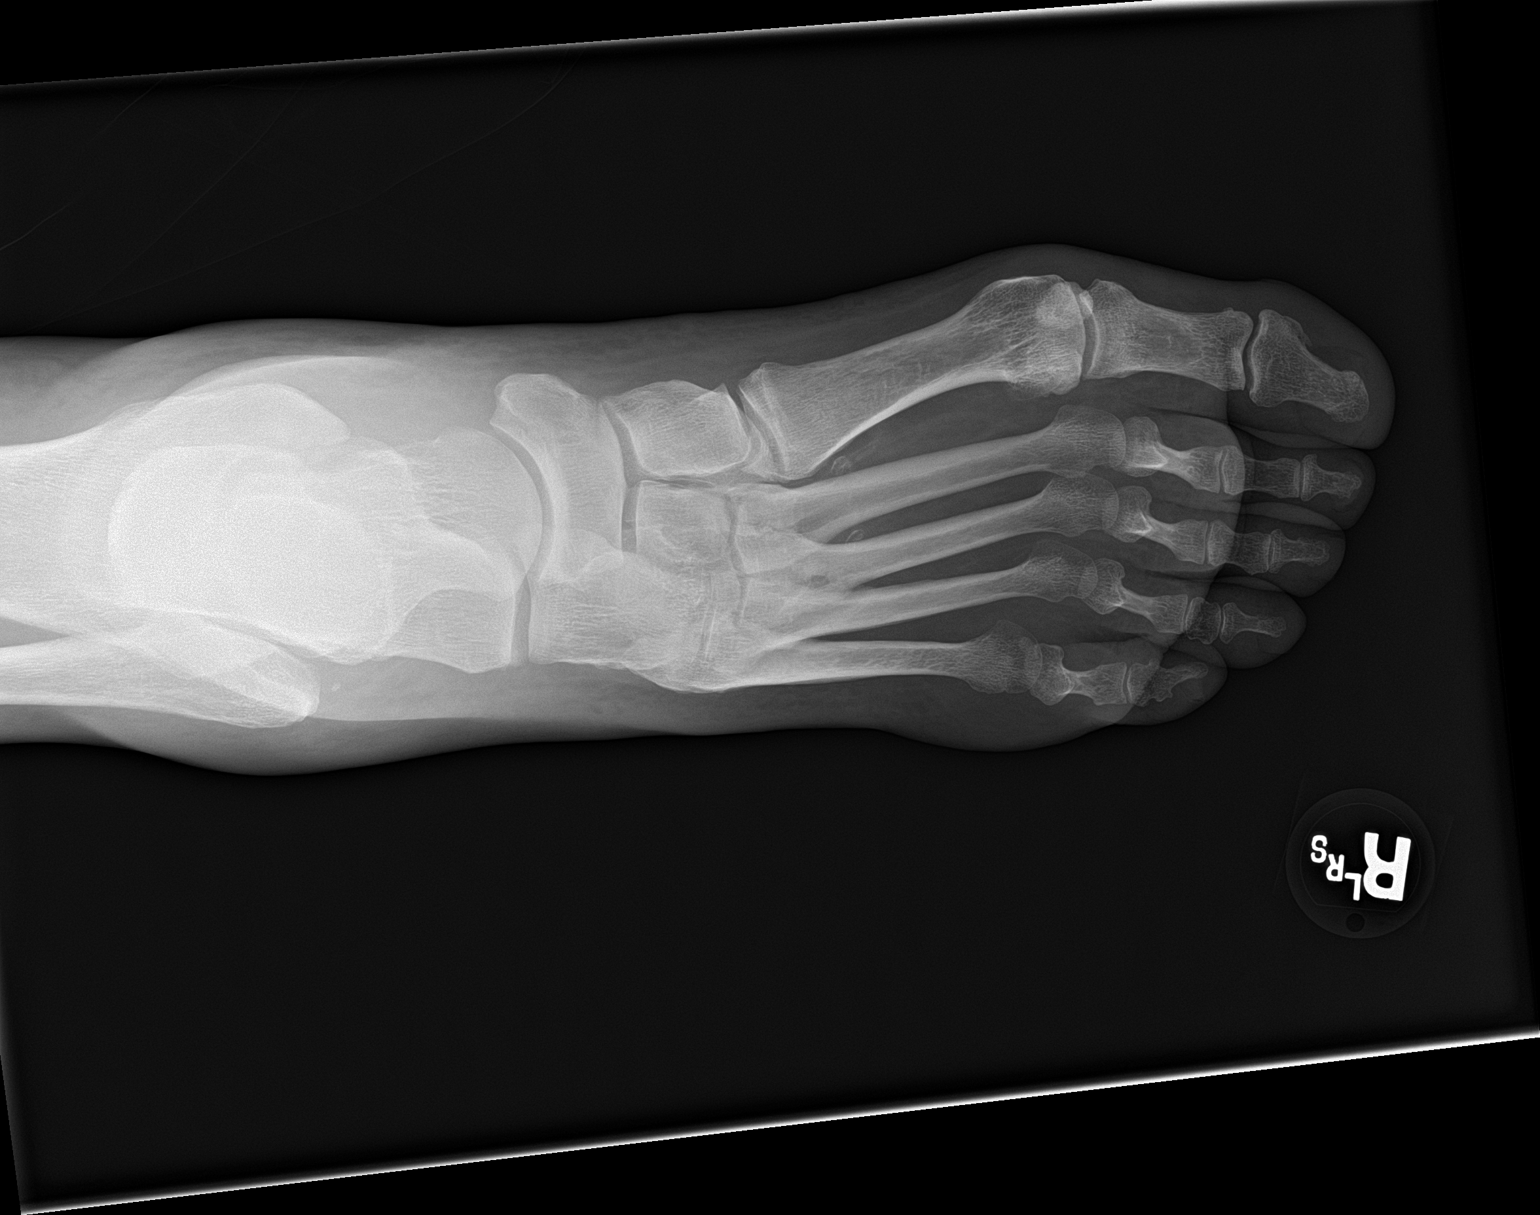

[foot lat]
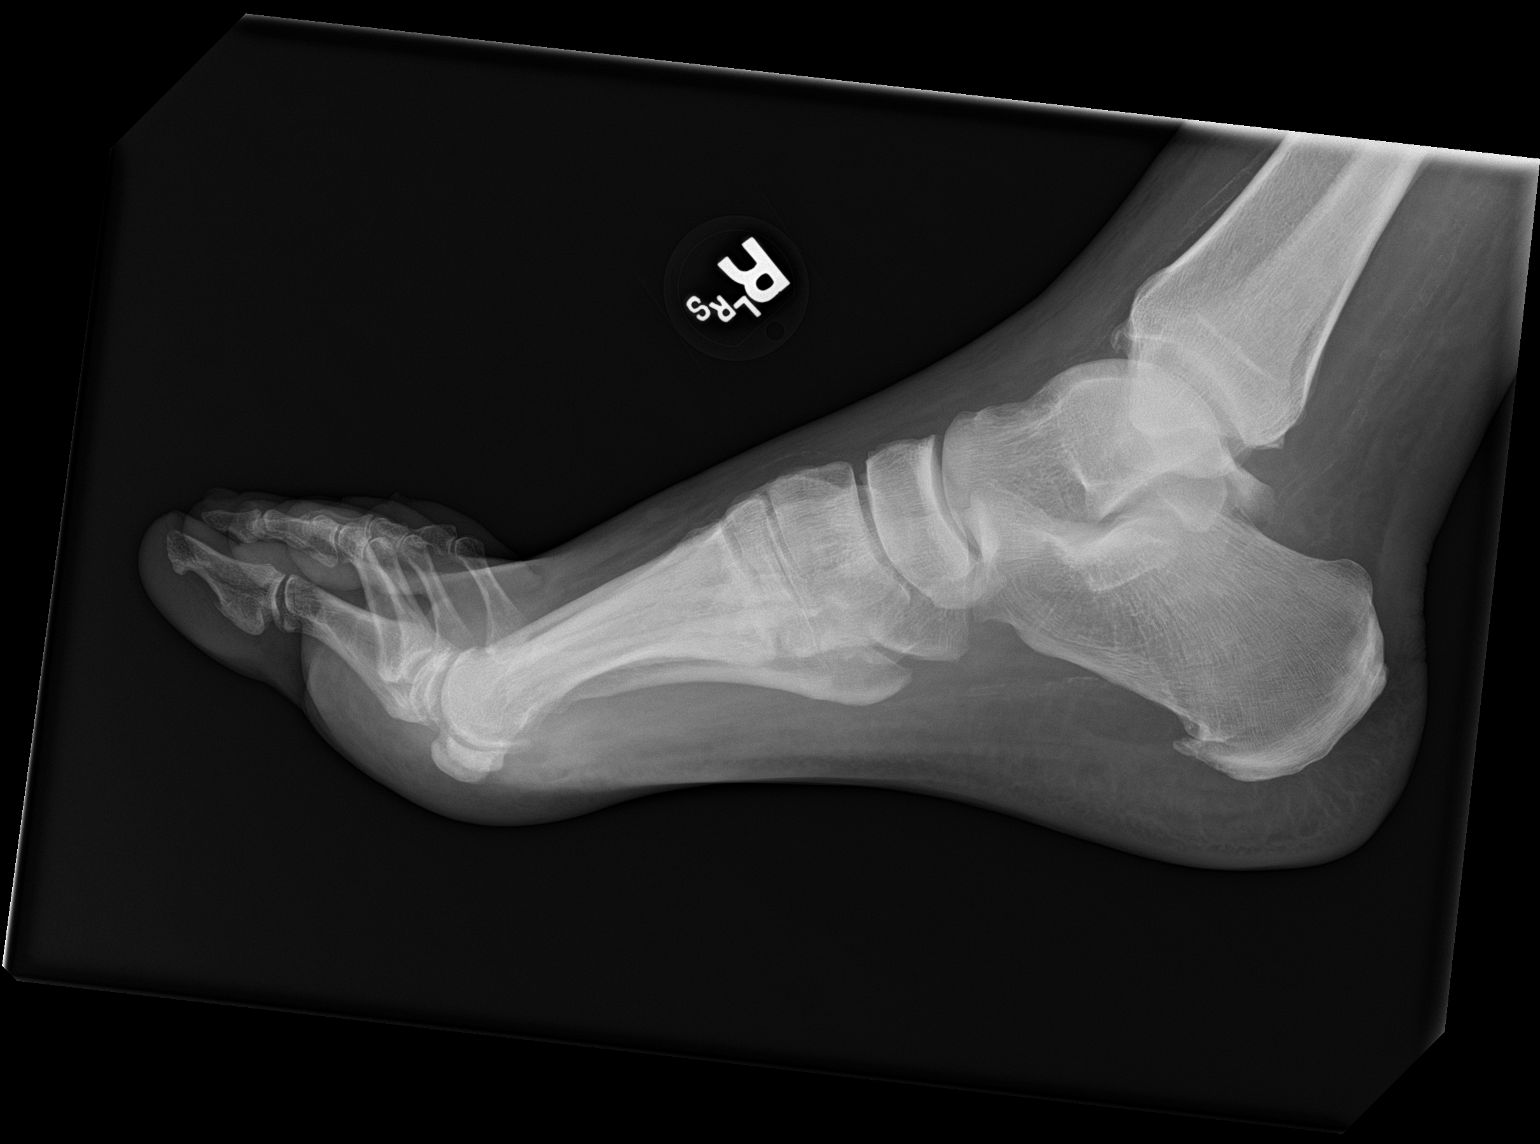

[2 of 2 positions shown; findings below may reference images not displayed]

FINDINGS: The known soft tissue ulceration is not well characterized on
radiograph. Soft tissue swelling is noted about the distal fifth
metatarsal.

There is no evidence of osseous erosion. Scattered vascular
calcifications are seen. There is no evidence of fracture or
dislocation. Visualized joint spaces are grossly preserved. The
subtalar joint is unremarkable. A plantar calcaneal spur is noted.
IMPRESSION: No evidence of osseous erosion. Known soft tissue ulceration is not
well characterized on radiograph. Scattered vascular calcifications
seen.

## 2017-03-15 ENCOUNTER — Encounter: Payer: Managed Care, Other (non HMO) | Attending: Physician Assistant | Admitting: Physician Assistant

## 2017-03-15 DIAGNOSIS — L97512 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d: Secondary | ICD-10-CM | POA: Diagnosis not present

## 2017-03-15 DIAGNOSIS — Z8249 Family history of ischemic heart disease and other diseases of the circulatory system: Secondary | ICD-10-CM | POA: Insufficient documentation

## 2017-03-15 DIAGNOSIS — E1140 Type 2 diabetes mellitus with diabetic neuropathy, unspecified: Secondary | ICD-10-CM | POA: Insufficient documentation

## 2017-03-15 DIAGNOSIS — I10 Essential (primary) hypertension: Secondary | ICD-10-CM | POA: Insufficient documentation

## 2017-03-15 DIAGNOSIS — E11621 Type 2 diabetes mellitus with foot ulcer: Secondary | ICD-10-CM | POA: Diagnosis present

## 2017-03-16 NOTE — Progress Notes (Addendum)
DEKOTA, SHENK ([id number]) Visit Report for 03/15/2017 Allergy List Details Patient Name: [name], [name]. Date of Service: 03/15/2017 9:45 AM Medical Record Number: [id number] Patient Account Number: [id number] Date of Birth/Sex: [birth year] (52 y.o. Male) Treating RN: Montey Hora Primary Care Caryl Manas: [name] Other Clinician: Referring Elex Mainwaring: Referral, Self Treating Kiahna Banghart/Extender: STONE III, [name] Weeks in Treatment: 0 Allergies Active Allergies No Known Allergies Allergy Notes Electronic Signature(s) Signed: 03/15/2017 3:33:05 PM By: Montey Hora Entered By: Montey Hora on 03/15/2017 09:54:52 [name] ([id number]) -------------------------------------------------------------------------------- Arrival Information Details Patient Name: [name] Date of Service: 03/15/2017 9:45 AM Medical Record Number: [id number] Patient Account Number: [id number] Date of Birth/Sex: 07-31-1965 (52 y.o. Male) Treating RN: Montey Hora Primary Care Suzette Flagler: [name] Other Clinician: Referring Venicia Vandall: Referral, Self Treating Marykate Heuberger/Extender: [name], [name] Weeks in Treatment: 0 Visit Information Patient Arrived: Ambulatory Arrival Time: 09:48 Accompanied By: self Transfer Assistance: None Patient Identification Verified: Yes Secondary Verification Process Completed: Yes Patient Has Alerts: Yes Patient Alerts: DM II History Since Last Visit Added or deleted any medications: No Any new allergies or adverse reactions: No Had a fall or experienced change in activities of daily living that may affect risk of falls: No Signs or symptoms of abuse/neglect since last visito No Hospitalized since last visit: No Has Dressing in Place as Prescribed: No Electronic Signature(s) Signed: 03/15/2017 3:33:05 PM By: Montey Hora Entered By: Montey Hora on 03/15/2017 09:51:35 [name]  ([id number]) -------------------------------------------------------------------------------- Clinic Level of Care Assessment Details Patient Name: [name] Date of Service: 03/15/2017 9:45 AM Medical Record Number: [id number] Patient Account Number: [id number] Date of Birth/Sex: 24-Mar-1965 (51 y.o. Male) Treating RN: Ahmed Prima Primary Care Jenniferann Stuckert: [name] Other Clinician: Referring Aulani Shipton: Referral, Self Treating Keyna Blizard/Extender: STONE III, [name] Weeks in Treatment: 0 Clinic Level of Care Assessment Items TOOL 1 Quantity Score X - Use when EandM and Procedure is performed on INITIAL visit 1 0 ASSESSMENTS - Nursing Assessment / Reassessment X - General Physical Exam (combine w/ comprehensive assessment (listed just below) when 1 20 performed on new pt. evals) X- 1 25 Comprehensive Assessment (HX, ROS, Risk Assessments, Wounds Hx, etc.) ASSESSMENTS - Wound and Skin Assessment / Reassessment []  - Dermatologic / Skin Assessment (not related to wound area) 0 ASSESSMENTS - Ostomy and/or Continence Assessment and Care []  - Incontinence Assessment and Management 0 []  - 0 Ostomy Care Assessment and Management (repouching, etc.) PROCESS - Coordination of Care X - Simple Patient / Family Education for ongoing care 1 15 []  - 0 Complex (extensive) Patient / Family Education for ongoing care []  - 0 Staff obtains Programmer, systems, Records, Test Results / Process Orders []  - 0 Staff telephones HHA, Nursing Homes / Clarify orders / etc []  - 0 Routine Transfer to another Facility (non-emergent condition) []  - 0 Routine Hospital Admission (non-emergent condition) X- 1 15 New Admissions / Biomedical engineer / Ordering NPWT, Apligraf, etc. []  - 0 Emergency Hospital Admission (emergent condition) PROCESS - Special Needs []  - Pediatric / Minor Patient Management 0 []  - 0 Isolation Patient Management []  - 0 Hearing / Language / Visual special needs []  -  0 Assessment of Community assistance (transportation, D/C planning, etc.) []  - 0 Additional assistance / Altered mentation []  - 0 Support Surface(s) Assessment (bed, cushion, seat, etc.) [name], [name] ([id number]) INTERVENTIONS - Miscellaneous []  - External ear exam 0 []  - 0 Patient Transfer (multiple staff / Civil Service fast streamer / Similar devices) []  - 0 Simple Staple / Suture removal (  25 or less) []  - 0 Complex Staple / Suture removal (26 or more) []  - 0 Hypo/Hyperglycemic Management (do not check if billed separately) X- 1 15 Ankle / Brachial Index (ABI) - do not check if billed separately Has the patient been seen at the hospital within the last three years: Yes Total Score: 90 Level Of Care: New/Established - Level 3 Electronic Signature(s) Signed: 03/15/2017 2:24:57 PM By: Alric Quan Entered By: Alric Quan on 03/15/2017 12:06:37 [name] ([id number]) -------------------------------------------------------------------------------- Encounter Discharge Information Details Patient Name: [name] Date of Service: 03/15/2017 9:45 AM Medical Record Number: [id number] Patient Account Number: [id number] Date of Birth/Sex: [birth year] (52 y.o. Male) Treating RN: Ahmed Prima Primary Care Chancy Smigiel: [name] Other Clinician: Referring Chalon Zobrist: Referral, Self Treating Avaiyah Strubel/Extender: [name], [name] Weeks in Treatment: 0 Encounter Discharge Information Items Discharge Pain Level: 0 Discharge Condition: Stable Ambulatory Status: Ambulatory Discharge Destination: Home Transportation: Other Schedule Follow-up Appointment: Yes Medication Reconciliation completed and No provided to Patient/Care Afra Tricarico: Provided on Clinical Summary of Care: 03/15/2017 Form Type Recipient Paper Patient DP Electronic Signature(s) Signed: 03/15/2017 2:35:19 PM By: Roger Shelter Entered By: Roger Shelter on 03/15/2017 10:50:01 [name] ([id number]) -------------------------------------------------------------------------------- Lower Extremity Assessment Details Patient Name: [name] Date of Service: 03/15/2017 9:45 AM Medical Record Number: [id number] Patient Account Number: [id number] Date of Birth/Sex: 01/12/1965 (52 y.o. Male) Treating RN: Montey Hora Primary Care Bardia Wangerin: [name] Other Clinician: Referring Kechia Yahnke: Referral, Self Treating Adonica Fukushima/Extender: [name], [name] Weeks in Treatment: 0 Edema Assessment Assessed: [Left: No] [Right: No] [Left: Edema] [Right: :] Calf Left: Right: Point of Measurement: 36 cm From Medial Instep cm 41.4 cm Ankle Left: Right: Point of Measurement: 12 cm From Medial Instep cm 28.8 cm Vascular Assessment Pulses: Dorsalis Pedis Palpable: [Right:Yes] Posterior Tibial Palpable: [Right:Yes] Extremity colors, hair growth, and conditions: Extremity Color: [Right:Normal] Hair Growth on Extremity: [Right:Yes] Temperature of Extremity: [Right:Warm] Capillary Refill: [Right:< 3 seconds] Blood Pressure: Brachial: [Right:140] Dorsalis Pedis: [Left:Dorsalis Pedis: 150] Ankle: Posterior Tibial: [Left:Posterior Tibial: 606] [Right:1.10] Toe Nail Assessment Left: Right: Thick: Yes Discolored: No Deformed: No Improper Length and Hygiene: No Electronic Signature(s) Signed: 03/15/2017 3:33:05 PM By: Montey Hora Entered By: Montey Hora on 03/15/2017 10:07:14 [name] ([id number]) -------------------------------------------------------------------------------- Multi Wound Chart Details Patient Name: [name] Date of Service: 03/15/2017 9:45 AM Medical Record Number: [id number] Patient Account Number: [id number] Date of Birth/Sex: March 26, 1965 (52 y.o. Male) Treating RN: Ahmed Prima Primary Care Annaleese Guier: [name] Other Clinician: Referring Wetona Viramontes: Referral, Self Treating Deannie Resetar/Extender: STONE III,  [name] Weeks in Treatment: 0 Vital Signs Height(in): 74 Pulse(bpm): 71 Weight(lbs): 300 Blood Pressure(mmHg): 147/66 Body Mass Index(BMI): 39 Temperature(F): 97.6 Respiratory Rate 16 (breaths/min): Photos: [3:No Photos] [N/A:N/A] Wound Location: [3:Right Toe Great] [N/A:N/A] Wounding Event: [3:Trauma] [N/A:N/A] Primary Etiology: [3:Diabetic Wound/Ulcer of the Lower Extremity] [N/A:N/A] Comorbid History: [3:Hypertension, Type II Diabetes, Neuropathy] [N/A:N/A] Date Acquired: [3:03/13/2017] [N/A:N/A] Weeks of Treatment: [3:0] [N/A:N/A] Wound Status: [3:Open] [N/A:N/A] Pending Amputation on [3:Yes] [N/A:N/A] Presentation: Measurements L x W x D [3:0.5x1x0.1] [N/A:N/A] (cm) Area (cm) : [3:0.393] [N/A:N/A] Volume (cm) : [3:0.039] [N/A:N/A] Classification: [3:Grade 1] [N/A:N/A] Exudate Amount: [3:Medium] [N/A:N/A] Exudate Type: [3:Serous] [N/A:N/A] Exudate Color: [3:amber] [N/A:N/A] Wound Margin: [3:Flat and Intact] [N/A:N/A] Granulation Amount: [3:Large (67-100%)] [N/A:N/A] Granulation Quality: [3:Pink] [N/A:N/A] Necrotic Amount: [3:None Present (0%)] [N/A:N/A] Exposed Structures: [3:Fat Layer (Subcutaneous Tissue) Exposed: Yes Fascia: No Tendon: No Muscle: No Joint: No Bone: No] [N/A:N/A] Epithelialization: [3:None] [N/A:N/A] Periwound Skin Texture: [3:Callus: Yes Excoriation: No Induration: No Crepitus:  No] [N/A:N/A] Rash: No Scarring: No Periwound Skin Moisture: Maceration: No N/A N/A Dry/Scaly: No Periwound Skin Color: Erythema: Yes N/A N/A Atrophie Blanche: No Cyanosis: No Ecchymosis: No Hemosiderin Staining: No Mottled: No Pallor: No Rubor: No Erythema Location: Circumferential N/A N/A Temperature: No Abnormality N/A N/A Tenderness on Palpation: No N/A N/A Wound Preparation: Ulcer Cleansing: N/A N/A Rinsed/Irrigated with Saline Topical Anesthetic Applied: Other: lidocaine 4% Treatment Notes Electronic Signature(s) Signed: 03/15/2017 2:24:57 PM By:  Alric Quan Entered By: Alric Quan on 03/15/2017 10:16:44 [name] ([id number]) -------------------------------------------------------------------------------- [place] Details Patient Name: [name], [name]. Date of Service: 03/15/2017 9:45 AM Medical Record Number: [id number] Patient Account Number: [id number] Date of Birth/Sex: 1965-03-21 (52 y.o. Male) Treating RN: Ahmed Prima Primary Care Ethridge Sollenberger: [name] Other Clinician: Referring Jonothan Heberle: Referral, Self Treating Bea Duren/Extender: [name], [name] Weeks in Treatment: 0 Active Inactive ` Nutrition Nursing Diagnoses: Imbalanced nutrition Impaired glucose control: actual or potential Potential for alteratiion in Nutrition/Potential for imbalanced nutrition Goals: Patient/caregiver verbalizes understanding of need to maintain therapeutic glucose control per primary care physician Date Initiated: 03/15/2017 Target Resolution Date: 06/09/2017 Goal Status: Active Interventions: Assess patient nutrition upon admission and as needed per policy Provide education on elevated blood sugars and impact on wound healing Notes: ` Orientation to the Wound Care Program Nursing Diagnoses: Knowledge deficit related to the wound healing center program Goals: Patient/caregiver will verbalize understanding of the [place] Date Initiated: 03/15/2017 Target Resolution Date: 04/07/2017 Goal Status: Active Interventions: Provide education on orientation to the wound center Notes: ` Pain, Acute or Chronic Nursing Diagnoses: Pain, acute or chronic: actual or potential Potential alteration in comfort, pain Goals: Patient/caregiver will verbalize adequate pain control between visits [name], [name] ([id number]) Date Initiated: 03/15/2017 Target Resolution Date: 07/07/2017 Goal Status: Active Interventions: Complete pain assessment as per visit  requirements Notes: ` Wound/Skin Impairment Nursing Diagnoses: Impaired tissue integrity Knowledge deficit related to ulceration/compromised skin integrity Goals: Ulcer/skin breakdown will have a volume reduction of 80% by week 12 Date Initiated: 03/15/2017 Target Resolution Date: 06/02/2017 Goal Status: Active Interventions: Assess patient/caregiver ability to perform ulcer/skin care regimen upon admission and as needed Assess ulceration(s) every visit Notes: Electronic Signature(s) Signed: 03/15/2017 2:24:57 PM By: Alric Quan Entered By: Alric Quan on 03/15/2017 10:17:10 [name] ([id number]) -------------------------------------------------------------------------------- Pain Assessment Details Patient Name: [name] Date of Service: 03/15/2017 9:45 AM Medical Record Number: [id number] Patient Account Number: [id number] Date of Birth/Sex: 10/19/1965 (52 y.o. Male) Treating RN: Montey Hora Primary Care Richelle Glick: [name] Other Clinician: Referring Lucyle Alumbaugh: Referral, Self Treating Vitoria Conyer/Extender: [name], [name] Weeks in Treatment: 0 Active Problems Location of Pain Severity and Description of Pain Patient Has Paino No Site Locations Pain Management and Medication Current Pain Management: Electronic Signature(s) Signed: 03/15/2017 3:33:05 PM By: Montey Hora Entered By: Montey Hora on 03/15/2017 09:51:48 [name] ([id number]) -------------------------------------------------------------------------------- Patient/Caregiver Education Details Patient Name: [name] Date of Service: 03/15/2017 9:45 AM Medical Record Number: [id number] Patient Account Number: [id number] Date of Birth/Gender: [birth year] (52 y.o. Male) Treating RN: Roger Shelter Primary Care Physician: [name] Other Clinician: Referring Physician: Referral, Self Treating Physician/Extender: Sharalyn Ink in  Treatment: 0 Education Assessment Education Provided To: Patient Education Topics Provided Wound Debridement: Handouts: Wound Debridement Methods: Explain/Verbal Responses: State content correctly Wound/Skin Impairment: Handouts: Caring for Your Ulcer Methods: Explain/Verbal Responses: State content correctly Electronic Signature(s) Signed: 03/15/2017 2:35:19 PM By: Roger Shelter Entered By: Roger Shelter on 03/15/2017 10:50:23 [name] ([id number]) --------------------------------------------------------------------------------  Wound Assessment Details Patient Name: [name], [name]. Date of Service: 03/15/2017 9:45 AM Medical Record Number: [id number] Patient Account Number: [id number] Date of Birth/Sex: October 05, 1965 (52 y.o. Male) Treating RN: Montey Hora Primary Care Shannon Kirkendall: [name] Other Clinician: Referring Adalie Mand: Referral, Self Treating Adlee Paar/Extender: STONE III, [name] Weeks in Treatment: 0 Wound Status Wound Number: 3 Primary Etiology: Diabetic Wound/Ulcer of the Lower Extremity Wound Location: Right Toe Great Wound Status: Open Wounding Event: Trauma Comorbid Hypertension, Type II Diabetes, Date Acquired: 03/13/2017 History: Neuropathy Weeks Of Treatment: 0 Clustered Wound: No Pending Amputation On Presentation Photos Photo Uploaded By: Montey Hora on 03/15/2017 10:37:37 Wound Measurements Length: (cm) 0.5 Width: (cm) 1 Depth: (cm) 0.1 Area: (cm) 0.393 Volume: (cm) 0.039 % Reduction in Area: % Reduction in Volume: Epithelialization: None Tunneling: No Undermining: No Wound Description Classification: Grade 1 Wound Margin: Flat and Intact Exudate Amount: Medium Exudate Type: Serous Exudate Color: amber Foul Odor After Cleansing: No Slough/Fibrino No Wound Bed Granulation Amount: Large (67-100%) Exposed Structure Granulation Quality: Pink Fascia Exposed: No Necrotic Amount: None Present (0%) Fat Layer  (Subcutaneous Tissue) Exposed: Yes Tendon Exposed: No Muscle Exposed: No Joint Exposed: No Bone Exposed: No Periwound Skin Texture [name], [name]. ([id number]) Texture Color No Abnormalities Noted: No No Abnormalities Noted: No Callus: Yes Atrophie Blanche: No Crepitus: No Cyanosis: No Excoriation: No Ecchymosis: No Induration: No Erythema: Yes Rash: No Erythema Location: Circumferential Scarring: No Hemosiderin Staining: No Mottled: No Moisture Pallor: No No Abnormalities Noted: No Rubor: No Dry / Scaly: No Maceration: No Temperature / Pain Temperature: No Abnormality Wound Preparation Ulcer Cleansing: Rinsed/Irrigated with Saline Topical Anesthetic Applied: Other: lidocaine 4%, Treatment Notes Wound #3 (Right Toe Great) 1. Cleansed with: Clean wound with Normal Saline 2. Anesthetic Topical Lidocaine 4% cream to wound bed prior to debridement 4. Dressing Applied: Prisma Ag 5. Secondary Dressing Applied Dry Gauze Notes conform and stretch net Electronic Signature(s) Signed: 03/15/2017 3:33:05 PM By: Montey Hora Entered By: Montey Hora on 03/15/2017 10:01:16 [name] ([id number]) -------------------------------------------------------------------------------- Vitals Details Patient Name: [name] Date of Service: 03/15/2017 9:45 AM Medical Record Number: [id number] Patient Account Number: [id number] Date of Birth/Sex: [birth year] (52 y.o. Male) Treating RN: Montey Hora Primary Care Mayline Dragon: [name] Other Clinician: Referring Saleah Rishel: Referral, Self Treating Davione Lenker/Extender: [name], [name] Weeks in Treatment: 0 Vital Signs Time Taken: 09:51 Temperature (F): 97.6 Height (in): 74 Pulse (bpm): 71 Source: Measured Respiratory Rate (breaths/min): 16 Weight (lbs): 300 Blood Pressure (mmHg): 147/66 Source: Measured Reference Range: 80 - 120 mg / dl Body Mass Index (BMI): 38.5 Electronic  Signature(s) Signed: 03/15/2017 3:33:05 PM By: Montey Hora Entered By: Montey Hora on 03/15/2017 09:53:54

## 2017-03-16 NOTE — Progress Notes (Signed)
TAHJAE, CLAUSING ([id number]) Visit Report for 03/15/2017 Abuse/Suicide Risk Screen Details Patient Name: [name], [name]. Date of Service: 03/15/2017 9:45 AM Medical Record Number: [id number] Patient Account Number: [id number] Date of Birth/Sex: 04-Apr-1965 (52 y.o. Male) Treating RN: Montey Hora Primary Care Houda Brau: [name] Other Clinician: Referring Phil Michels: Referral, Self Treating Torin Modica/Extender: STONE III, HOYT Weeks in Treatment: 0 Abuse/Suicide Risk Screen Items Answer ABUSE/SUICIDE RISK SCREEN: Has anyone close to you tried to hurt or harm you recentlyo No Do you feel uncomfortable with anyone in your familyo No Has anyone forced you do things that you didnot want to doo No Do you have any thoughts of harming yourselfo No Patient displays signs or symptoms of abuse and/or neglect. No Electronic Signature(s) Signed: 03/15/2017 3:33:05 PM By: Montey Hora Entered By: Montey Hora on 03/15/2017 09:56:35 [name] ([id number]) -------------------------------------------------------------------------------- Activities of Daily Living Details Patient Name: [name], [name]. Date of Service: 03/15/2017 9:45 AM Medical Record Number: [id number] Patient Account Number: [id number] Date of Birth/Sex: [birth year] (52 y.o. Male) Treating RN: Montey Hora Primary Care Squire Withey: [name] Other Clinician: Referring Bethanne Mule: Referral, Self Treating Devaris Quirk/Extender: Melburn Hake, HOYT Weeks in Treatment: 0 Activities of Daily Living Items Answer Activities of Daily Living (Please select one for each item) Drive Automobile Completely Able Take Medications Completely Able Use Telephone Completely Able Care for Appearance Completely Able Use Toilet Completely Able Bath / Shower Completely Able Dress Self Completely Able Feed Self Completely Able Walk Completely Able Get In / Out Bed Completely Able Housework Completely Able Prepare  Meals Completely [place] for Self Completely Able Electronic Signature(s) Signed: 03/15/2017 3:33:05 PM By: Montey Hora Entered By: Montey Hora on 03/15/2017 09:56:56 [name] ([id number]) -------------------------------------------------------------------------------- Education Assessment Details Patient Name: [name] Date of Service: 03/15/2017 9:45 AM Medical Record Number: [id number] Patient Account Number: [id number] Date of Birth/Sex: [birth year] (52 y.o. Male) Treating RN: Montey Hora Primary Care Jadee Golebiewski: [name] Other Clinician: Referring Meagan Spease: Referral, Self Treating Tyreshia Ingman/Extender: Melburn Hake, HOYT Weeks in Treatment: 0 Primary Learner Assessed: Patient Learning Preferences/Education Level/Primary Language Learning Preference: Explanation, Demonstration Highest Education Level: College or Above Preferred Language: English Cognitive Barrier Assessment/Beliefs Language Barrier: No Translator Needed: No Memory Deficit: No Emotional Barrier: No Cultural/Religious Beliefs Affecting Medical Care: No Physical Barrier Assessment Impaired Vision: No Impaired Hearing: No Decreased Hand dexterity: No Knowledge/Comprehension Assessment Knowledge Level: Medium Comprehension Level: Medium Ability to understand written Medium instructions: Ability to understand verbal Medium instructions: Motivation Assessment Anxiety Level: Calm Cooperation: Cooperative Education Importance: Acknowledges Need Interest in Health Problems: Asks Questions Perception: Coherent Willingness to Engage in Self- Medium Management Activities: Readiness to Engage in Self- Medium Management Activities: Electronic Signature(s) Signed: 03/15/2017 3:33:05 PM By: Montey Hora Entered By: Montey Hora on 03/15/2017 09:57:20 [name]  ([id number]) -------------------------------------------------------------------------------- Fall Risk Assessment Details Patient Name: [name] Date of Service: 03/15/2017 9:45 AM Medical Record Number: [id number] Patient Account Number: [id number] Date of Birth/Sex: Jan 23, 1965 (52 y.o. Male) Treating RN: Montey Hora Primary Care Dee Paden: [name] Other Clinician: Referring Arletha Marschke: Referral, Self Treating Janiyla Long/Extender: Melburn Hake, HOYT Weeks in Treatment: 0 Fall Risk Assessment Items Have you had 2 or more falls in the last 12 monthso 0 No Have you had any fall that resulted in injury in the last 12 monthso 0 No FALL RISK ASSESSMENT: History of falling - immediate or within 3 months 0 No Secondary diagnosis 0 No Ambulatory aid None/bed rest/wheelchair/nurse 0 Yes Crutches/cane/walker 0 No Furniture  0 No IV Access/Saline Lock 0 No Gait/Training Normal/bed rest/immobile 0 Yes Weak 0 No Impaired 0 No Mental Status Oriented to own ability 0 Yes Electronic Signature(s) Signed: 03/15/2017 3:33:05 PM By: Montey Hora Entered By: Montey Hora on 03/15/2017 09:57:28 [name] ([id number]) -------------------------------------------------------------------------------- Nutrition Risk Assessment Details Patient Name: [name] Date of Service: 03/15/2017 9:45 AM Medical Record Number: [id number] Patient Account Number: [id number] Date of Birth/Sex: 03/26/1965 (52 y.o. Male) Treating RN: Montey Hora Primary Care Ryanne Morand: [name] Other Clinician: Referring Tyrianna Lightle: Referral, Self Treating Aubery Date/Extender: Melburn Hake, HOYT Weeks in Treatment: 0 Height (in): 74 Weight (lbs): 300 Body Mass Index (BMI): 38.5 Nutrition Risk Assessment Items NUTRITION RISK SCREEN: I have an illness or condition that made me change the kind and/or amount of 0 No food I eat I eat fewer than two meals per day 0 No I eat few fruits and  vegetables, or milk products 0 No I have three or more drinks of beer, liquor or wine almost every day 0 No I have tooth or mouth problems that make it hard for me to eat 0 No I don't always have enough money to buy the food I need 0 No I eat alone most of the time 0 No I take three or more different prescribed or over-the-counter drugs a day 1 Yes Without wanting to, I have lost or gained 10 pounds in the last six months 0 No I am not always physically able to shop, cook and/or feed myself 0 No Nutrition Protocols Good Risk Protocol 0 No interventions needed Moderate Risk Protocol Electronic Signature(s) Signed: 03/15/2017 3:33:05 PM By: Montey Hora Entered By: Montey Hora on 03/15/2017 09:57:35

## 2017-03-18 NOTE — Progress Notes (Addendum)
SAED, HUDLOW ([id number]) Visit Report for 03/15/2017 Chief Complaint Document Details Patient Name: [name], [name]. Date of Service: 03/15/2017 9:45 AM Medical Record Number: [id number] Patient Account Number: [id number] Date of Birth/Sex: 12/16/1965 (52 y.o. Male) Treating RN: Ahmed Prima Primary Care Provider: [name] Other Clinician: Referring Provider: [name] Treating Provider/Extender: Melburn Hake, Yalitza Teed Weeks in Treatment: 0 Information Obtained from: Patient Chief Complaint Right great toe ulcer Electronic Signature(s) Signed: 03/16/2017 6:13:52 PM By: [name] PA-C Entered By: [name] on 03/15/2017 15:33:54 [name] ([id number]) -------------------------------------------------------------------------------- Debridement Details Patient Name: [name] Date of Service: 03/15/2017 9:45 AM Medical Record Number: [id number] Patient Account Number: [id number] Date of Birth/Sex: [birth year] (52 y.o. Male) Treating RN: Ahmed Prima Primary Care Provider: [name] Other Clinician: Referring Provider: [name] Treating Provider/Extender: Melburn Hake, Artie Mcintyre Weeks in Treatment: 0 Debridement Performed for Wound #3 Right Toe Great Assessment: Performed By: Physician STONE III, Shahara Hartsfield E., PA-C Debridement: Open Wound/Selective Severity of Tissue Pre Fat layer exposed Debridement: Debridement Description: Selective Pre-procedure Verification/Time Yes - 10:17 Out Taken: Start Time: 10:18 Pain Control: Lidocaine 4% Topical Solution Level: Non-Viable Tissue Total Area Debrided (L x W): 0.5 (cm) x 1 (cm) = 0.5 (cm) Tissue and other material Non-Viable, Callus, Skin debrided: Instrument: Forceps, Scissors Bleeding: None End Time: 10:19 Procedural Pain: 0 Post Procedural Pain: 0 Response to Treatment: Procedure was tolerated well Post Debridement Measurements of Total Wound Length: (cm)  0.5 Width: (cm) 1 Depth: (cm) 0.2 Volume: (cm) 0.079 Character of Wound/Ulcer Post Debridement: Improved Severity of Tissue Post Debridement: Fat layer exposed Post Procedure Diagnosis Same as Pre-procedure Electronic Signature(s) Signed: 03/16/2017 3:16:46 PM By: Alric Quan Signed: 03/16/2017 6:13:52 PM By: [name] PA-C Previous Signature: 03/15/2017 2:24:57 PM Version By: Alric Quan Entered By: [name] on 03/15/2017 15:40:53 [name] ([id number]) -------------------------------------------------------------------------------- HPI Details Patient Name: [name] Date of Service: 03/15/2017 9:45 AM Medical Record Number: [id number] Patient Account Number: [id number] Date of Birth/Sex: 06-02-1965 (52 y.o. Male) Treating RN: Ahmed Prima Primary Care Provider: [name] Other Clinician: Referring Provider: [name] Treating Provider/Extender: STONE III, Sherrie Marsan Weeks in Treatment: 0 History of Present Illness HPI Description: This 52 year old male comes with an ulcerated area on the plantar aspect of the right foot which she's had for approximately a month. I have known him from a previous visit at [facility] wound center and was treated in the months of April and May 2016 and rapidly healed a left plantar ulcer with a total contact cast. He has been a diabetic for about 16 years and tries to keep active and is fairly compliant with his diabetes management. 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 Past medical history significant for hypertension, hyperlipidemia, and status post appendectomy 1993. He does not smoke or drink alcohol. 01/14/2015 -- the patient had tolerated his total contact cast very well and had no problems and has had no systemic symptoms. However when his total contact cast was cut open he had excessive amount of purulent drainage in spite of being on antibiotics. He had had a recent x-ray done in  the ER 12/22/2014 which showed IMPRESSION:No evidence of osseous erosion. Known soft tissue ulceration is not well characterized on radiograph. Scattered vascular calcifications seen. his last hemoglobin A1c in December was 7.3. He has been on Augmentin and doxycycline for the last 2 weeks. 01/21/2015 -- his culture grew rare growth of Pantoea species an MR moderate growth of Candida parapsilosis. it  is sensitive to levofloxacin. He has not heard back from the insurance company regarding his hyperbaric oxygen therapy. His MRI has not been done yet and we will try and get him an earlier date 01/28/2015 -- MRI was done last night -- IMPRESSION:1. Soft tissue ulcer overlying the plantar aspect of the fifth metatarsal head extending to the cortex. Subcortical marrow edema in the fifth metatarsal head with corresponding T1 hypointensity is concerning for early osteomyelitis of the plantar lateral aspect of the fifth metatarsal head. Chest x-ray done on 01/14/2015 shows bronchiectatic changes without infiltrate. EKG done on generally 17 2017 shows a normal sinus rhythm and is a normal EKG. 02/04/2015 -- he was asked to see Dr. [name] last week and had 2 appointments but had to cancel both due to pressures of work. Last night he has woken up with severe pain in the foot and leg and it is swollen up. No fever or no change in his blood glucose. Addendum: I spoke with Dr. [name] who kindly agreed to accept the patient for inpatient therapy and have also opened to the hospitalist Dr. [name], and discuss details of the management including PICC line and repeat cultures. 02/12/2015-- -- was seen by Dr. [name] in the hospital and a PICC line was placed. He was to receive Ceftazidime 2 g every 12 hourly, oral levofloxacin 750 mg every 24 hourly and oral fluconazole 200 mg daily. The antibiotics were to be given for 4 weeks except the Diflucan was to be given for the first 2 weeks. Reviewed note from  02/10/2015 -- and Dr. [name] had recommended management for growth of MSSA and Serratia. He switched him from ceftazidime to ceftriaxone 2 g every 24 hours. Levofloxacin was stopped and he would continue on fluconazole for another week. He had asked me to decide whether further imaging was necessary and whether surgical debridement of the infected bone was needed. He is doing well and has been off work for this week and we will keep him off the next week. 02/22/2015 -- he was seen by my colleague on 01/19/2015 and at that time an incision and drainage was done on his right lateral forefoot on the dorsum. Today when I probed this wound it is frankly draining pus and it communicates with the ulcer on the plantar aspect of his right foot. The patient is still on IV ceftriaxone 2 g every 24 hours and is to be seen by Dr. [name] on Friday. FINNEAS, MATHE ([id number]) 03/19/2015 -- On 03/04/2015 I spoke to Dr. 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 He recommended operative debridement which would probably result in the fifth metatarsal head and toe amputation.The patient would be referred back to [place] once he was done with surgery. He was admitted to [facility] yesterday and had surgery done by podiatry for a right fifth metatarsal acute osteomyelitis with cellulitis and abscess. He had a right foot incision and drainage with fifth metatarsal partial amputation and removal of toe infected bone and soft tissue with cultures. The wound was partially closed and packing of the distal end was done. Patient was already on cefepime 2 g IV every 8 hourly and put on vancomycin pending final cultures. He had grown moderate gram-negative rods, and later found to be rare diphtheroids. I received a call from  Dr. Cannon Kettle the podiatrist  and we discussed the above. On 03/10/2015 he was found positive for influenza a and has been put on Tamiflu. Since his discharge he has been seen by Dr. Cannon Kettle who is planning to remove his sutures this coming week. He was reviewed by Dr. [name] on 03/17/2015 and his Diflucan was stopped and vancomycin. After the 3 doses he is taking. He is going to change Ceftazidime to Zosyn 3.375 g IV every 8 hours. 03/25/2015 -- he was seen by the podiatrist a couple of days ago and the sutures were removed. She will follow back with him in 4 weeks' time and at that time x-rays will be taken and a custom molded insert would be made for his shoe. 04/01/2015 -- he was seen by Dr. [name] on 03/29/2015 who pulled the PICC line stopped his IV antibiotics and recommended starting doxycycline and levofloxacin for 2 weeks. He also stop the fluconazole. The patient will follow up with him only when necessary. Readmission: 03/15/17 on evaluation today patient presents for initial evaluation concerning the new issues although he has previously been evaluated in our clinic. Unfortunately the previous evaluation led to the patient having to proceed to amputation and so he is somewhat nervous about being here today. With that being said he has a very slight blister that occurred on the plantar aspect more medial on the right great toe that has been present for just a very short amount of time, several days. With that being said he felt like initially when he called that he somewhat overreacted but due to the fact that his previous issue led to amputation he is very cautious these days I explained that he did the right thing. With that being said he has been tolerating the dressing changes without complication mainly he just been covering this. He does not have any discomfort in secondary to neuropathy it's unlikely that he feels much. With that being said he does tell me that the issue that he had here is that  he went barefoot when he knows he should not have which subsequently led to the blisters. He states is definitely not doing that anymore. His most recent hemoglobin A1c was December and registered 6.9 his ABI today was 1.1. Electronic Signature(s) Signed: 03/16/2017 6:13:52 PM By: [name] PA-C Entered By: [name] on 03/15/2017 15:36:24 [name] ([id number]) -------------------------------------------------------------------------------- Physical Exam Details Patient Name: MABLE, LASHLEY Date of Service: 03/15/2017 9:45 AM Medical Record Number: [id number] Patient Account Number: [id number] Date of Birth/Sex: 1965/03/05 (52 y.o. Male) Treating RN: Ahmed Prima Primary Care Provider: [name] Other Clinician: Referring Provider: [name] Treating Provider/Extender: STONE III, Lamarkus Nebel Weeks in Treatment: 0 Constitutional patient is hypertensive.. pulse regular and within target range for patient.Marland Kitchen respirations regular, non-labored and within target range for patient.Marland Kitchen temperature within target range for patient.. Well-nourished and well-hydrated in no acute distress. Eyes conjunctiva clear no eyelid edema noted. pupils equal round and reactive to light and accommodation. Ears, Nose, Mouth, and Throat no gross abnormality of ear auricles or external auditory canals. normal hearing noted during conversation. mucus membranes moist. Respiratory normal breathing without difficulty. clear to auscultation bilaterally. Cardiovascular regular rate and rhythm with normal S1, S2. 2+ dorsalis pedis/posterior tibialis pulses. no clubbing, cyanosis, significant edema, <3 sec cap refill. Gastrointestinal (GI) soft, non-tender, non-distended, +BS. no ventral hernia noted. Musculoskeletal normal gait and posture. no significant deformity or arthritic changes, no loss or range of motion, no clubbing. Psychiatric this  patient is able to make decisions and  demonstrates good insight into disease process. Alert and Oriented x 3. pleasant and cooperative. Notes Patient's blood pressure was somewhat elevated today but I think this may be due in part to the fact that he is somewhat nervous about being here due to his previous experience. Nothing to do with our clinic just due to the fact that following all of his treatment he and up with an invitation anyway. Therefore he tells me that he almost even fault sick to his stomach just walking down the hallway coming back to the room. Patient's wound actually did show evidence of a small blister and there was fat layer/granular tissue noted underneath. With that being said the blister was only half attached and I did remove utilizing scissors and forceps the remainder of the blister to uncover the wound. The good news is this appears to be fairly superficial in general and I'm hopeful that this will heal very nicely. The only concern I had was that the medial portion of the blister actually does but up against the callous on the medial portion of his toe and therefore I wasn't sure if this was going to cause problems as far as eternal being present leading into the callous. Fortunately it does not appear to be the case. Patient was very happy to hear that just me getting the skinny probe made him somewhat worried today was really having a rough morning. Electronic Signature(s) Signed: 03/16/2017 6:13:52 PM By: [name] PA-C Entered By: [name] on 03/15/2017 15:38:46 [name] ([id number]) -------------------------------------------------------------------------------- Physician Orders Details Patient Name: [name] Date of Service: 03/15/2017 9:45 AM Medical Record Number: [id number] Patient Account Number: [id number] Date of Birth/Sex: 1965/08/04 (52 y.o. Male) Treating RN: Ahmed Prima Primary Care Provider: [name] Other Clinician: Referring Provider:  [name] Treating Provider/Extender: Melburn Hake, Demisha Nokes Weeks in Treatment: 0 Verbal / Phone Orders: Yes Clinician: Pinkerton, Debi Read Back and Verified: Yes Diagnosis Coding Wound Cleansing Wound #3 Right Toe Great o Clean wound with wound cleanser. o Cleanse wound with mild soap and water o May Shower, gently pat wound dry prior to applying new dressing. Anesthetic (add to Medication List) Wound #3 Right Toe Great o Topical Lidocaine 4% cream applied to wound bed prior to debridement (In Clinic Only). Primary Wound Dressing Wound #3 Right Toe Great o Prisma Ag Secondary Dressing Wound #3 Right Toe Great o Dry Gauze o Conform/Kerlix o Foam Dressing Change Frequency Wound #3 Right Toe Great o Change dressing every day. Follow-up Appointments Wound #3 Right Toe Great o Return Appointment in 1 week. Additional Orders / Instructions Wound #3 Right Toe Great o Increase protein intake. Patient Medications Allergies: No Known Allergies Notifications Medication Indication Start End lidocaine DOSE 1 - topical 4 % cream - 1 cream topical DANFORD, TAT ([id number]) Electronic Signature(s) Signed: 03/15/2017 2:24:57 PM By: Alric Quan Signed: 03/16/2017 6:13:52 PM By: [name] PA-C Entered By: Alric Quan on 03/15/2017 10:23:13 KRUZE, ATCHLEY ([id number]) -------------------------------------------------------------------------------- Prescription 03/15/2017 Patient Name: [name]. Provider: [name] PA-C Date of Birth: 1965-05-10 NPI#: [id number] Sex: Valeta Harms: [id number] Phone #: [phone] License #: Patient Address: [facility] [place], [place] [address] [address], [place], [place] [address] [phone] Allergies No Known Allergies Medication Medication: Route: Strength: Form: lidocaine  topical 4% cream Class: TOPICAL LOCAL ANESTHETICS Dose: Frequency / Time:  Indication: 1 1 cream topical Number of Refills: Number of Units: 0 Generic Substitution: Start Date: End Date: Administered at [place]: Yes Time Administered: Time Discontinued: Note to Pharmacy: Signature(s): Date(s): Electronic Signature(s) Signed: 03/15/2017 2:24:57 PM By: Alric Quan Signed: 03/16/2017 6:13:52 PM By: [name] PA-C Entered By: Alric Quan on 03/15/2017 10:23:14 HUMBERT, MOROZOV ([id number]) CHAYTON, MURATA ([id number]) --------------------------------------------------------------------------------  Problem List Details Patient Name: BASSAM, DRESCH. Date of Service: 03/15/2017 9:45 AM Medical Record Number: [id number] Patient Account Number: [id number] Date of Birth/Sex: 04-28-1965 (52 y.o. 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 Male) Treating RN: Ahmed Prima Primary Care Provider: [name] Other  Clinician: Referring Provider: [name] Treating Provider/Extender: Melburn Hake, Ishmel Acevedo Weeks in Treatment: 0 Subjective Chief Complaint Information obtained from Patient Right great toe ulcer History of Present Illness (HPI) This 52 year old male comes with an ulcerated area on the plantar aspect of the right foot which she's had for approximately a month. I have known him from a previous visit at [facility] wound center and was treated in the months of April and May 2016 and rapidly healed a left plantar ulcer with a total contact cast. He has been a diabetic for about 16 years and tries to keep active and is fairly compliant with his diabetes management. 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 Past medical history significant for hypertension, hyperlipidemia, and status post appendectomy 1993. He does not smoke or drink alcohol. 01/14/2015 -- the patient had tolerated his total contact cast very well and had no problems and has had no systemic symptoms. However when his total contact cast was cut open he had excessive amount of purulent drainage in spite of being on antibiotics. He had had a recent x-ray done in the ER 12/22/2014 which showed IMPRESSION:No evidence of osseous erosion. Known soft tissue ulceration is not well characterized on radiograph. Scattered vascular calcifications seen. his last hemoglobin A1c in December was 7.3. He has been on Augmentin and doxycycline for the last 2 weeks. 01/21/2015 -- his culture grew rare growth of Pantoea species an MR moderate growth of Candida parapsilosis. it is sensitive to levofloxacin. He has not heard back from the insurance company regarding his hyperbaric oxygen therapy. His MRI has not been done yet and we will try and get him an earlier date 01/28/2015 -- MRI was done last night -- IMPRESSION:1. Soft tissue ulcer overlying the plantar aspect of the fifth metatarsal head extending to the cortex. Subcortical marrow edema  in the fifth metatarsal head with corresponding T1 hypointensity is concerning for early osteomyelitis of the plantar lateral aspect of the fifth metatarsal head. Chest x-ray done on 01/14/2015 shows bronchiectatic changes without infiltrate. EKG done on generally 17 2017 shows a normal sinus rhythm and is a normal EKG. 02/04/2015 -- he was asked to  see Dr. [name] last week and had 2 appointments but had to cancel both due to pressures of work. Last night he has woken up with severe pain in the foot and leg and it is swollen up. No fever or no change in his blood glucose. Addendum: I spoke with Dr. [name] who kindly agreed to accept the patient for inpatient therapy and have also opened to the hospitalist Dr. [name], and discuss details of the management including PICC line and repeat cultures. 02/12/2015-- -- was seen by Dr. [name] in the hospital and a PICC line was placed. He was to receive Ceftazidime 2 g every 12 hourly, oral levofloxacin 750 mg every 24 hourly and oral fluconazole 200 mg daily. The antibiotics were to be given for 4 weeks except the Diflucan was to be given for the first 2 weeks. Reviewed note from 02/10/2015 -- and Dr. [name] had recommended management for growth of MSSA and Serratia. He switched him from ceftazidime to ceftriaxone 2 g every 24 hours. Levofloxacin was stopped and he would continue on fluconazole for another week. He had asked me to decide whether further imaging was necessary and whether surgical debridement of the infected bone was needed. DEWANE, TIMSON ([id number]) He is doing well and has been off work for this week and we will keep him off the next week. 02/22/2015 -- he was seen by my colleague on 01/19/2015 and at that time an incision and drainage was done on his right lateral forefoot on the dorsum. Today when I probed this wound it is frankly draining pus and it communicates with the ulcer on the plantar aspect of his right  foot. The patient is still on IV ceftriaxone 2 g every 24 hours and is to be seen by Dr. [name] on Friday. 03/19/2015 -- On 03/04/2015 I spoke to Dr. 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 He recommended operative debridement which would probably result in the fifth metatarsal head and toe amputation.The patient would be referred back to [place] once he was done with surgery. He was admitted to [facility] yesterday and had surgery done by podiatry for a right fifth metatarsal acute osteomyelitis with cellulitis and abscess. He had a right foot incision and drainage with fifth metatarsal partial amputation and removal of toe infected bone and soft tissue with cultures. The wound was partially closed and packing of the distal end was done. Patient was already on cefepime 2 g IV every 8 hourly and put on vancomycin pending final cultures. He had grown moderate gram-negative rods, and later found to be rare diphtheroids. I received a call from Dr. Cannon Kettle the podiatrist and we discussed the above. On 03/10/2015 he was found positive for influenza a and has been put on Tamiflu. Since his discharge he has been seen by Dr. Cannon Kettle who is planning to remove his sutures this coming week. He was reviewed by Dr. [name] on 03/17/2015 and his Diflucan was stopped and vancomycin. After the 3 doses he is taking. He is going to change Ceftazidime to Zosyn 3.375 g IV every 8 hours. 03/25/2015 -- he was seen by the podiatrist a couple of days ago and the sutures were removed. She will follow back with him in 4 weeks' time and at that time x-rays will be taken and a custom  molded insert would be made for his shoe. 04/01/2015 -- he was seen by Dr. [name] on 03/29/2015 who pulled the PICC line stopped his IV antibiotics and recommended  starting doxycycline and levofloxacin for 2 weeks. He also stop the fluconazole. The patient will follow up with him only when necessary. Readmission: 03/15/17 on evaluation today patient presents for initial evaluation concerning the new issues although he has previously been evaluated in our clinic. Unfortunately the previous evaluation led to the patient having to proceed to amputation and so he is somewhat nervous about being here today. With that being said he has a very slight blister that occurred on the plantar aspect more medial on the right great toe that has been present for just a very short amount of time, several days. With that being said he felt like initially when he called that he somewhat overreacted but due to the fact that his previous issue led to amputation he is very cautious these days I explained that he did the right thing. With that being said he has been tolerating the dressing changes without complication mainly he just been covering this. He does not have any discomfort in secondary to neuropathy it's unlikely that he feels much. With that being said he does tell me that the issue that he had here is that he went barefoot when he knows he should not have which subsequently led to the blisters. He states is definitely not doing that anymore. His most recent hemoglobin A1c was December and registered 6.9 his ABI today was 1.1. Wound History Patient presents with 1 open wound that has been present for approximately 3 days. Patient has been treating wound in the following manner: neosporin and bandaid. Laboratory tests have not been performed in the last month. Patient reportedly has tested positive for an antibiotic resistant organism. Patient reportedly has tested positive for osteomyelitis. Patient reportedly has not had testing performed to evaluate circulation in the legs. Patient History Information obtained from Patient. Allergies No Known Allergies Family  History Cancer - Father, Diabetes - Father, Hypertension - Father, No family history of Heart Disease, Hereditary Spherocytosis, Kidney Disease, Lung Disease, Seizures, Stroke, Thyroid Problems, Tuberculosis. MARCELLES, CLINARD ([id number]) Social History Never smoker, Marital Status - Married, Alcohol Use - Never, Drug Use - No History, Caffeine Use - Daily. Review of Systems (ROS) Eyes Complains or has symptoms of Glasses / Contacts - glasses. Objective Constitutional patient is hypertensive.. pulse regular and within target range for patient.Marland Kitchen respirations regular, non-labored and within target range for patient.Marland Kitchen temperature within target range for patient.. Well-nourished and well-hydrated in no acute distress. Vitals Time Taken: 9:51 AM, Height: 74 in, Source: Measured, Weight: 300 lbs, Source: Measured, BMI: 38.5, Temperature: 97.6 F, Pulse: 71 bpm, Respiratory Rate: 16 breaths/min, Blood Pressure: 147/66 mmHg. Eyes conjunctiva clear no eyelid edema noted. pupils equal round and reactive to light and accommodation. Ears, Nose, Mouth, and Throat no gross abnormality of ear auricles or external auditory canals. normal hearing noted during conversation. mucus membranes moist. Respiratory normal breathing without difficulty. clear to auscultation bilaterally. Cardiovascular regular rate and rhythm with normal S1, S2. 2+ dorsalis pedis/posterior tibialis pulses. no clubbing, cyanosis, significant edema, Gastrointestinal (GI) soft, non-tender, non-distended, +BS. no ventral hernia noted. Musculoskeletal normal gait and posture. no significant deformity or arthritic changes, no loss or range of motion, no clubbing. Psychiatric this patient is able to make decisions and demonstrates good insight into disease process. Alert and Oriented x 3. pleasant and cooperative. General  Notes: Patient's blood pressure was somewhat elevated today but I think this may be due in part to the fact  that he is somewhat nervous about being here due to his previous experience. Nothing to do with our clinic just due to the fact that following all of his treatment he and up with an invitation anyway. Therefore he tells me that he almost even fault sick to his stomach just walking down the hallway coming back to the room. Patient's wound actually did show evidence of a small blister and there was fat layer/granular tissue noted underneath. With that being said the blister was only half attached and I did remove utilizing scissors and forceps the remainder of the blister to uncover the wound. The good news is this appears to be fairly superficial in general and I'm hopeful that this will heal very nicely. The only concern I had was that the medial portion of the blister actually does but up against the callous on the medial portion of his toe and therefore I wasn't sure if this was going to cause problems as far as eternal being present leading into the callous. Fortunately it does not appear to be the case. Patient was very happy to hear that just me getting the skinny probe made him somewhat worried today was really having a rough morning. JAIMEN, MELONE ([id number]) Integumentary (Hair, Skin) Wound #3 status is Open. Original cause of wound was Trauma. The wound is located on the Right Toe Great. The wound measures 0.5cm length x 1cm width x 0.1cm depth; 0.393cm^2 area and 0.039cm^3 volume. There is Fat Layer (Subcutaneous Tissue) Exposed exposed. There is no tunneling or undermining noted. There is a medium amount of serous drainage noted. The wound margin is flat and intact. There is large (67-100%) pink granulation within the wound bed. There is no necrotic tissue within the wound bed. The periwound skin appearance exhibited: Callus, Erythema. The periwound skin appearance did not exhibit: Crepitus, Excoriation, Induration, Rash, Scarring, Dry/Scaly, Maceration, Atrophie  Blanche, Cyanosis, Ecchymosis, Hemosiderin Staining, Mottled, Pallor, Rubor. The surrounding wound skin color is noted with erythema which is circumferential. Periwound temperature was noted as No Abnormality. Assessment Active Problems ICD-10 E11.621 - Type 2 diabetes mellitus with foot ulcer L97.512 - 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d L84 - Corns and callosities Procedures Wound #3 Pre-procedure diagnosis of Wound #3 is a Diabetic Wound/Ulcer of the Lower Extremity located on the Right Toe Great .Severity of Tissue Pre Debridement is: Fat layer exposed. There was a Non-Viable Tissue Open Wound/Selective [phone]) debridement with total area of 0.5 sq cm performed by STONE III, Katherleen Folkes E., PA-C. with the following instrument(s): Forceps and Scissors to remove Non-Viable tissue/material including Skin and Callus after achieving pain control using Lidocaine 4% Topical Solution. A time out was conducted at 10:17, prior to the start of the procedure. There was no bleeding. The procedure was tolerated well with a pain level of 0 throughout and a pain level of 0 following the procedure. Post Debridement Measurements: 0.5cm length x 1cm width x 0.2cm depth; 0.079cm^3 volume. Character of Wound/Ulcer Post Debridement is improved. Severity of Tissue Post Debridement is: Fat layer exposed. Post procedure Diagnosis Wound #3: Same as Pre-Procedure Plan Wound Cleansing: Wound #3 Right Toe Great: Clean wound with wound cleanser. Cleanse wound with mild soap and water May Shower, gently pat wound dry prior to applying new dressing. Anesthetic (add to Medication List): Wound #3 Right Toe Great:  Topical Lidocaine 4% cream applied to wound bed prior to debridement (In Clinic Only). Primary Wound Dressing: Wound #3 Right Toe GreatDEVLON, DOSHER ([id number]) Prisma Ag Secondary Dressing: Wound #3 Right Toe Great: Dry  Gauze Conform/Kerlix Foam Dressing Change Frequency: Wound #3 Right Toe Great: Change dressing every day. Follow-up Appointments: Wound #3 Right Toe Great: Return Appointment in 1 week. Additional Orders / Instructions: Wound #3 Right Toe Great: Increase protein intake. The following medication(s) was prescribed: lidocaine topical 4 % cream 1 1 cream topical was prescribed at facility Fortunately this appears to be very superficial ulcer and the patient in general appears to be doing very well. I'm gonna recommend that we continue to treat this wound and I will see him for reevaluation next week. I'm gonna initiate treatment with Prisma to see if this will be helpful he's in agreement with plan. Otherwise I do not think any x-rays are needed or necessary at this point this appears just be rupture blisters which I'm hopeful will heal very quickly. Patient is very pleased to hear this. Please see above for specific wound care orders. We will see patient for re-evaluation in 1 week(s) here in the clinic. If anything worsens or changes patient will contact our office for additional recommendations. Electronic Signature(s) Signed: 03/21/2017 3:37:37 AM By: [name] PA-C Previous Signature: 03/16/2017 6:13:52 PM Version By: [name] PA-C Entered By: [name] on 03/21/2017 02:52:46 [name] ([id number]) -------------------------------------------------------------------------------- ROS/PFSH Details Patient Name: [name] Date of Service: 03/15/2017 9:45 AM Medical Record Number: [id number] Patient Account Number: [id number] Date of Birth/Sex: 02/09/1965 (52 y.o. Male) Treating RN: Montey Hora Primary Care Provider: [name] Other Clinician: Referring Provider: [name] Treating Provider/Extender: Melburn Hake, Soul Deveney Weeks in Treatment: 0 Information Obtained From Patient Wound History Do you currently have one or more open  woundso Yes How many open wounds do you currently haveo 1 Approximately how long have you had your woundso 3 days How have you been treating your wound(s) until nowo neosporin and bandaid Has your wound(s) ever healed and then re-openedo No Have you had any lab work done in the past montho No Have you tested positive for an antibiotic resistant organism (MRSA, VRE)o Yes Have you tested positive for osteomyelitis (bone infection)o Yes Have you had any tests for circulation on your legso No Eyes Complaints and Symptoms: Positive for: Glasses / Contacts - glasses Medical History: Negative for: Cataracts; Glaucoma; Optic Neuritis Ear/Nose/Mouth/Throat Medical History: Negative for: Chronic sinus problems/congestion; Middle ear problems Hematologic/Lymphatic Medical History: Negative for: Anemia; Hemophilia; Human Immunodeficiency Virus; Lymphedema; Sickle Cell Disease Respiratory Medical History: Negative for: Aspiration; Asthma; Chronic Obstructive Pulmonary Disease (COPD); Pneumothorax; Sleep Apnea; Tuberculosis Cardiovascular Medical History: Positive for: Hypertension Negative for: Angina; Arrhythmia; Congestive Heart Failure; Coronary Artery Disease; Myocardial Infarction; Peripheral Arterial Disease; Peripheral Venous Disease; Phlebitis; Vasculitis Gastrointestinal Medical History: Negative for: Cirrhosis ; Colitis; Crohnos; Hepatitis A; Hepatitis B; Hepatitis C KAELUM, KISSICK. ([id number]) Endocrine Medical History: Positive for: Type II Diabetes Time with diabetes: 13 years Treated with: Insulin, Oral agents Blood sugar tested every day: No Blood sugar testing results: Breakfast: 141 Genitourinary Medical History: Negative for: End Stage Renal Disease Immunological Medical History: Negative for: Lupus Erythematosus; Raynaudos; Scleroderma Integumentary (Skin) Medical History: Negative for: History of Burn; History of pressure wounds Musculoskeletal Medical  History: Negative for: Gout; Rheumatoid Arthritis; Osteoarthritis; Osteomyelitis Neurologic Medical History: Positive for: Neuropathy Oncologic Medical History: Negative for: Received Chemotherapy; Received Radiation Psychiatric Medical  History: Negative for: Anorexia/bulimia; Confinement Anxiety Immunizations Pneumococcal Vaccine: Received Pneumococcal Vaccination: No Implantable Devices Family and Social History Cancer: Yes - Father; Diabetes: Yes - Father; Heart Disease: No; Hereditary Spherocytosis: No; Hypertension: Yes - Father; Kidney Disease: No; Lung Disease: No; Seizures: No; Stroke: No; Thyroid Problems: No; Tuberculosis: No; Never smoker; Marital Status - Married; Alcohol Use: Never; Drug Use: No History; Caffeine Use: Daily; Financial Concerns: No; Food, Clothing or Shelter Needs: No; Support System Lacking: No; Transportation Concerns: No; Advanced Directives: No; Patient does not want information on Advanced Directives; Living Will: No HILLERY, BHALLA ([id number]) Electronic Signature(s) Signed: 03/15/2017 3:33:05 PM By: Montey Hora Signed: 03/16/2017 6:13:52 PM By: [name] PA-C Entered By: Montey Hora on 03/15/2017 09:56:23 [name] ([id number]) -------------------------------------------------------------------------------- SuperBill Details Patient Name: [name] Date of Service: 03/15/2017 Medical Record Number: [id number] Patient Account Number: [id number] Date of Birth/Sex: Dec 17, 1965 (52 y.o. Male) Treating RN: Ahmed Prima Primary Care Provider: [name] Other Clinician: Referring Provider: [name] Treating Provider/Extender: Melburn Hake, Helena Sardo Weeks in Treatment: 0 Diagnosis Coding ICD-10 Codes Code Description E11.621 Type 2 diabetes mellitus with foot ulcer L97.512 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d L84 Corns and callosities Facility Procedures CPT4  Code: [id number] Description: 99213 - WOUND CARE VISIT-LEV 3 EST PT Modifier: Quantity: 1 CPT4 Code: [id number] Description: 17408 - DEBRIDE WOUND 1ST 20 SQ CM OR < ICD-10 Diagnosis Description L97.512 Non-pressure chronic ulcer of other part of right foot with fat Modifier: layer exposed Quantity: 1 Physician Procedures CPT4 Code: [id number] Description: 63149 - WC PHYS LEVEL 4 - EST PT ICD-10 Diagnosis Description E11.621 Type 2 diabetes mellitus with foot ulcer L97.512 Non-pressure chronic ulcer of other part of right foot with fat L84 Corns and callosities Modifier: 25 layer exposed Quantity: 1 CPT4 Code: [id number] Description: 58850 - WC PHYS DEBR WO ANESTH 20 SQ CM ICD-10 Diagnosis Description L97.512 Non-pressure chronic ulcer of other part of right foot with fat Modifier: layer exposed Quantity: 1 Electronic Signature(s) Signed: 03/16/2017 6:13:52 PM By: [name] PA-C Entered By: [name] on 03/15/2017 15:41:49

## 2017-03-20 ENCOUNTER — Encounter: Payer: Managed Care, Other (non HMO) | Admitting: Physician Assistant

## 2017-03-20 DIAGNOSIS — E11621 Type 2 diabetes mellitus with foot ulcer: Secondary | ICD-10-CM | POA: Diagnosis not present

## 2017-03-25 NOTE — Progress Notes (Signed)
GREER, KOEPPEN ([id number]) Visit Report for 03/20/2017 Arrival Information Details Patient Name: [name], [name]. Date of Service: 03/20/2017 10:00 AM Medical Record Number: [id number] Patient Account Number: [id number] Date of Birth/Sex: 1965/09/07 (52 y.o. M) Treating RN: Cornell Barman Primary Care Jahquez Steffler: [name] Other Clinician: Referring Berkeley Vanaken: [name] Treating Nyelah Emmerich/Extender: Melburn Hake, HOYT Weeks in Treatment: 0 Visit Information History Since Last Visit Added or deleted any medications: No Patient Arrived: Ambulatory Any new allergies or adverse reactions: No Arrival Time: 10:06 Had a fall or experienced change in No Accompanied By: self activities of daily living that may affect Transfer Assistance: None risk of falls: Patient Identification Verified: Yes Signs or symptoms of abuse/neglect since last visito No Secondary Verification Process Completed: Yes Hospitalized since last visit: No Patient Has Alerts: Yes Has Dressing in Place as Prescribed: Yes Patient Alerts: DM II Pain Present Now: No Electronic Signature(s) Signed: 03/23/2017 4:43:23 PM By: Gretta Cool, BSN, RN, CWS, Kim RN, BSN Entered By: Gretta Cool, BSN, RN, CWS, Kim on 03/20/2017 10:06:22 [name] ([id number]) -------------------------------------------------------------------------------- Encounter Discharge Information Details Patient Name: [name], [name]. Date of Service: 03/20/2017 10:00 AM Medical Record Number: [id number] Patient Account Number: [id number] Date of Birth/Sex: 07/21/1965 (52 y.o. M) Treating RN: Cornell Barman Primary Care Shaquina Gillham: [name] Other Clinician: Referring Amely Voorheis: [name] Treating Emilyanne Mcgough/Extender: Melburn Hake, HOYT Weeks in Treatment: 0 Encounter Discharge Information Items Discharge Pain Level: 0 Discharge Condition: Stable Ambulatory Status: Ambulatory Discharge Destination: Home Transportation: Private  Auto Accompanied By: self Schedule Follow-up Appointment: Yes Medication Reconciliation completed and Yes provided to Patient/Care Damondre Pfeifle: Provided on Clinical Summary of Care: 03/20/2017 Form Type Recipient Paper Patient DP Electronic Signature(s) Signed: 03/23/2017 4:43:23 PM By: Gretta Cool, BSN, RN, CWS, Kim RN, BSN Entered By: Gretta Cool, BSN, RN, CWS, Kim on 03/20/2017 11:09:11 [name] ([id number]) -------------------------------------------------------------------------------- Lower Extremity Assessment Details Patient Name: [name], [name]. Date of Service: 03/20/2017 10:00 AM Medical Record Number: [id number] Patient Account Number: [id number] Date of Birth/Sex: Jun 30, 1965 (52 y.o. M) Treating RN: Cornell Barman Primary Care Devaun Hernandez: [name] Other Clinician: Referring Derika Eckles: [name] Treating Camilia Caywood/Extender: Melburn Hake, HOYT Weeks in Treatment: 0 Edema Assessment Assessed: [Left: No] [Right: No] Edema: [Left: N] [Right: o] Vascular Assessment Pulses: Dorsalis Pedis Palpable: [Right:Yes] Posterior Tibial Extremity colors, hair growth, and conditions: Extremity Color: [Right:Normal] Hair Growth on Extremity: [Right:Yes] Temperature of Extremity: [Right:Warm] Capillary Refill: [Right:< 3 seconds] Toe Nail Assessment Left: Right: Thick: No Discolored: No Deformed: No Improper Length and Hygiene: No 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 M) Treating RN: Cornell Barman Primary Care  Kentavious Michele: [name] Other Clinician: Referring Pheng Prokop: [name] Treating Gaynor Ferreras/Extender: Melburn Hake, HOYT Weeks in Treatment: 0 Vital Signs Height(in): 74 Pulse(bpm): 86 Weight(lbs): 300 Blood Pressure(mmHg): 135/75 Body Mass Index(BMI): 39 Temperature(F): 98.1 Respiratory Rate 16 (breaths/min): Photos: [3:No Photos] [N/A:N/A] Wound Location: [3:Right Toe Great] [N/A:N/A] Wounding Event: [3:Trauma] [N/A:N/A] Primary Etiology: [3:Diabetic Wound/Ulcer of the Lower Extremity] [N/A:N/A] Comorbid History: [3:Hypertension, Type II Diabetes, Neuropathy] [N/A:N/A] Date Acquired: [3:03/13/2017] [N/A:N/A] Weeks of Treatment: [3:0] [N/A:N/A] Wound Status: [3:Open] [N/A:N/A] Pending Amputation on [3:Yes] [N/A:N/A] Presentation: Measurements L x W x D [3:0.3x0.5x0.1] [N/A:N/A] (cm) Area (cm) : [  3:0.118] [N/A:N/A] Volume (cm) : [3:0.012] [N/A:N/A] % Reduction in Area: [3:70.00%] [N/A:N/A] % Reduction in Volume: [3:69.20%] [N/A:N/A] Classification: [3:Grade 1] [N/A:N/A] Exudate Amount: [3:Medium] [N/A:N/A] Exudate Type: [3:Serous] [N/A:N/A] Exudate Color: [3:amber] [N/A:N/A] Wound Margin: [3:Flat and Intact] [N/A:N/A] Granulation Amount: [3:Large (67-100%)] [N/A:N/A] Granulation Quality: [3:Pink] [N/A:N/A] Necrotic Amount: [3:None Present (0%)] [N/A:N/A] Exposed Structures: [3:Fat Layer (Subcutaneous Tissue) Exposed: Yes Fascia: No Tendon: No Muscle: No Joint: No Bone: No] [N/A:N/A] Epithelialization: [3:None] [N/A:N/A] Periwound Skin Texture: [3:Callus: Yes Excoriation: No Induration: No] [N/A:N/A] Crepitus: No Rash: No Scarring: No Periwound Skin Moisture: Maceration: No N/A N/A Dry/Scaly: No Periwound Skin Color: Erythema: Yes N/A N/A Atrophie Blanche: No Cyanosis: No Ecchymosis: No Hemosiderin Staining: No Mottled: No Pallor: No Rubor: No Erythema Location: Circumferential N/A N/A Temperature: No Abnormality N/A N/A Tenderness on Palpation: No  N/A N/A Wound Preparation: Ulcer Cleansing: N/A N/A Rinsed/Irrigated with Saline Topical Anesthetic Applied: Other: lidocaine 4% Treatment Notes Electronic Signature(s) Signed: 03/23/2017 4:43:23 PM By: Gretta Cool, BSN, RN, CWS, Kim RN, BSN Entered By: Gretta Cool, BSN, RN, CWS, Kim on 03/20/2017 10:13:07 [name] ([id number]) -------------------------------------------------------------------------------- [place] Details Patient Name: [name], [name]. Date of Service: 03/20/2017 10:00 AM Medical Record Number: [id number] Patient Account Number: [id number] Date of Birth/Sex: 01/20/1965 (52 y.o. M) Treating RN: Cornell Barman Primary Care Emylee Decelle: [name] Other Clinician: Referring Antoinetta Berrones: [name] Treating Bailea Beed/Extender: Melburn Hake, HOYT Weeks in Treatment: 0 Active Inactive ` Nutrition Nursing Diagnoses: Imbalanced nutrition Impaired glucose control: actual or potential Potential for alteratiion in Nutrition/Potential for imbalanced nutrition Goals: Patient/caregiver verbalizes understanding of need to maintain therapeutic glucose control per primary care physician Date Initiated: 03/15/2017 Target Resolution Date: 06/09/2017 Goal Status: Active Interventions: Assess patient nutrition upon admission and as needed per policy Provide education on elevated blood sugars and impact on wound healing Notes: ` Orientation to the Wound Care Program Nursing Diagnoses: Knowledge deficit related to the wound healing center program Goals: Patient/caregiver will verbalize understanding of the [place] Date Initiated: 03/15/2017 Target Resolution Date: 04/07/2017 Goal Status: Active Interventions: Provide education on orientation to the wound center Notes: ` Pain, Acute or Chronic Nursing Diagnoses: Pain, acute or chronic: actual or potential Potential alteration in comfort, pain Goals: Patient/caregiver will verbalize  adequate pain control between visits [name], [name] ([id number]) Date Initiated: 03/15/2017 Target Resolution Date: 07/07/2017 Goal Status: Active Interventions: Complete pain assessment as per visit requirements Notes: ` Wound/Skin Impairment Nursing Diagnoses: Impaired tissue integrity Knowledge deficit related to ulceration/compromised skin integrity Goals: Ulcer/skin breakdown will have a volume reduction of 80% by week 12 Date Initiated: 03/15/2017 Target Resolution Date: 06/02/2017 Goal Status: Active Interventions: Assess patient/caregiver ability to perform ulcer/skin care regimen upon admission and as needed Assess ulceration(s) every visit Notes: Electronic Signature(s) Signed: 03/23/2017 4:43:23 PM By: Gretta Cool, BSN, RN, CWS, Kim RN, BSN Entered By: Gretta Cool, BSN, RN, CWS, Kim on 03/20/2017 10:12:59 [name] ([id number]) -------------------------------------------------------------------------------- Pain Assessment Details Patient Name: [name] Date of Service: 03/20/2017 10:00 AM Medical Record Number: [id number] Patient Account Number: [id number] Date of Birth/Sex: 03-27-1965 (53 y.o. M) Treating RN: Cornell Barman Primary Care Kelyn Koskela: [name] Other Clinician: Referring [place]is Gentle: [name] Treating Aijalon Kirtz/Extender: Melburn Hake, HOYT Weeks in Treatment: 0 Active Problems Location of Pain Severity and Description of Pain Patient Has Paino No Site Locations With Dressing Change: No Pain Management and Medication Current Pain Management: Goals for Pain Management Topical or injectable lidocaine is offered to patient for acute pain when surgical debridement is performed.  If needed, Patient is instructed to use over the counter pain medication for the following 24-48 hours after debridement. Wound care MDs do not prescribed pain medications. Patient has chronic pain or uncontrolled pain. Patient has been instructed to make an  appointment with their Primary Care Physician for pain management. Electronic Signature(s) Signed: 03/23/2017 4:43:23 PM By: Gretta Cool, BSN, RN, CWS, Kim RN, BSN Entered By: Gretta Cool, BSN, RN, CWS, Kim on 03/20/2017 10:06:36 [name] ([id number]) -------------------------------------------------------------------------------- Patient/Caregiver Education Details Patient Name: [name], [name] Date of Service: 03/20/2017 10:00 AM Medical Record Number: [id number] Patient Account Number: [id number] Date of Birth/Gender: [birth year] (52 y.o. M) Treating RN: Cornell Barman Primary Care Physician: [name] Other Clinician: Referring Physician: [name] Treating Physician/Extender: Sharalyn Ink in Treatment: 0 Education Assessment Education Provided To: Patient Education Topics Provided Pressure: Handouts: Pressure Ulcers: Care and Offloading Methods: Demonstration, Explain/Verbal Responses: State content correctly Wound/Skin Impairment: Handouts: Caring for Your Ulcer Methods: Demonstration, Explain/Verbal Responses: State content correctly Electronic Signature(s) Signed: 03/23/2017 4:43:23 PM By: Gretta Cool, BSN, RN, CWS, Kim RN, BSN Entered By: Gretta Cool, BSN, RN, CWS, Kim on 03/20/2017 11:09:35 [name] ([id number]) -------------------------------------------------------------------------------- Wound Assessment Details Patient Name: [name], [name]. Date of Service: 03/20/2017 10:00 AM Medical Record Number: [id number] Patient Account Number: [id number] Date of Birth/Sex: Mar 16, 1965 (52 y.o. M) Treating RN: Cornell Barman Primary Care Roen Macgowan: [name] Other Clinician: Referring Aleeza [place]: [name] Treating Shabre Kreher/Extender: Melburn Hake, HOYT Weeks in Treatment: 0 Wound Status Wound Number: 3 Primary Etiology: Diabetic Wound/Ulcer of the Lower Extremity Wound Location: Right Toe Great Wound Status: Open Wounding Event:  Trauma Comorbid Hypertension, Type II Diabetes, Date Acquired: 03/13/2017 History: Neuropathy Weeks Of Treatment: 0 Clustered Wound: No Pending Amputation On Presentation Photos Photo Uploaded By: Gretta Cool, BSN, RN, CWS, Kim on 03/20/2017 11:04:40 Wound Measurements Length: (cm) 0.3 Width: (cm) 0.5 Depth: (cm) 0.1 Area: (cm) 0.118 Volume: (cm) 0.012 % Reduction in Area: 70% % Reduction in Volume: 69.2% Epithelialization: None Wound Description Classification: Grade 1 Foul Odor Wound Margin: Flat and Intact Slough/Fi Exudate Amount: Medium Exudate Type: Serous Exudate Color: amber After Cleansing: No brino No Wound Bed Granulation Amount: Large (67-100%) Exposed Structure Granulation Quality: Pink Fascia Exposed: No Necrotic Amount: None Present (0%) Fat Layer (Subcutaneous Tissue) Exposed: Yes Tendon Exposed: No Muscle Exposed: No Joint Exposed: No Bone Exposed: No Periwound Skin Texture Texture Color No Abnormalities Noted: No No Abnormalities Noted: No [name], [name] ([id number]) Callus: Yes Atrophie Blanche: No Crepitus: No Cyanosis: No Excoriation: No Ecchymosis: No Induration: No Erythema: Yes Rash: No Erythema Location: Circumferential Scarring: No Hemosiderin Staining: No Mottled: No Moisture Pallor: No No Abnormalities Noted: No Rubor: No Dry / Scaly: No Maceration: No Temperature / Pain Temperature: No Abnormality Wound Preparation Ulcer Cleansing: Rinsed/Irrigated with Saline Topical Anesthetic Applied: Other: lidocaine 4%, Treatment Notes Wound #3 (Right Toe Great) 1. Cleansed with: Cleanse wound with antibacterial soap and water 2. Anesthetic Topical Lidocaine 4% cream to wound bed prior to debridement 4. Dressing Applied: Prisma Ag 5. Secondary Dressing Applied Foam Kerlix/Conform 7. Secured with Recruitment consultant) Signed: 03/23/2017 4:43:23 PM By: Gretta Cool, BSN, RN, CWS, Kim RN, BSN Entered By: Gretta Cool, BSN, RN,  CWS, Kim on 03/20/2017 10:11:46 [name] ([id number]) -------------------------------------------------------------------------------- Vitals Details Patient Name: [name], [name]. Date of Service: 03/20/2017 10:00 AM Medical Record Number: [id number] Patient Account Number: [id number] Date of Birth/Sex: [birth year] (52 y.o. M) Treating RN: Cornell Barman Primary Care Arrin Pintor: [name] Other Clinician: Referring Marai Teehan: [name]  Treating Toshio Slusher/Extender: STONE III, HOYT Weeks in Treatment: 0 Vital Signs Time Taken: 10:07 Temperature (F): 98.1 Height (in): 74 Pulse (bpm): 86 Weight (lbs): 300 Respiratory Rate (breaths/min): 16 Body Mass Index (BMI): 38.5 Blood Pressure (mmHg): 135/75 Reference Range: 80 - 120 mg / dl Electronic Signature(s) Signed: 03/23/2017 4:43:23 PM By: Gretta Cool, BSN, RN, CWS, Kim RN, BSN Entered By: Gretta Cool, BSN, RN, CWS, Kim on 03/20/2017 10:08:32

## 2017-03-25 NOTE — Progress Notes (Signed)
RAMIREZ, FULLBRIGHT ([id number]) Visit Report for 03/20/2017 Chief Complaint Document Details Patient Name: [name], [name]. Date of Service: 03/20/2017 10:00 AM Medical Record Number: [id number] Patient Account Number: [id number] Date of Birth/Sex: Jul 30, 1965 (52 y.o. M) Treating RN: Cornell Barman Primary Care Provider: [name] Other Clinician: Referring Provider: [name] Treating Provider/Extender: Melburn Hake, Kainoa Swoboda Weeks in Treatment: 0 Information Obtained from: Patient Chief Complaint Right great toe ulcer Electronic Signature(s) Signed: 03/21/2017 4:20:45 AM By: [name] PA-C Entered By: [name] on 03/20/2017 09:48:10 [name] ([id number]) -------------------------------------------------------------------------------- HPI Details Patient Name: [name] Date of Service: 03/20/2017 10:00 AM Medical Record Number: [id number] Patient Account Number: [id number] Date of Birth/Sex: 1965/01/10 (52 y.o. M) Treating RN: Cornell Barman Primary Care Provider: [name] Other Clinician: Referring Provider: [name] Treating Provider/Extender: Melburn Hake, Colonel Krauser Weeks in Treatment: 0 History of Present Illness HPI Description: This 52 year old male comes with an ulcerated area on the plantar aspect of the right foot which she's had for approximately a month. I have known him from a previous visit at [facility] wound center and was treated in the months of April and May 2016 and rapidly healed a left plantar ulcer with a total contact cast. He has been a diabetic for about 16 years and tries to keep active and is fairly compliant with his diabetes management. 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 Past medical history significant for hypertension, hyperlipidemia, and status post appendectomy 1993. He does not smoke or drink alcohol. 01/14/2015 -- the patient had tolerated his total contact cast very well and had no problems  and has had no systemic symptoms. However when his total contact cast was cut open he had excessive amount of purulent drainage in spite of being on antibiotics. He had had a recent x-ray done in the ER 12/22/2014 which showed IMPRESSION:No evidence of osseous erosion. Known soft tissue ulceration is not well characterized on radiograph. Scattered vascular calcifications seen. his last hemoglobin A1c in December was 7.3. He has been on Augmentin and doxycycline for the last 2 weeks. 01/21/2015 -- his culture grew rare growth of Pantoea species an MR moderate growth of Candida parapsilosis. it is sensitive to levofloxacin. He has not heard back from the insurance company regarding his hyperbaric oxygen therapy. His MRI has not been done yet and we will try and get him an earlier date 01/28/2015 -- MRI was done last night -- IMPRESSION:1. Soft tissue ulcer overlying the plantar aspect of the fifth metatarsal head extending to the cortex. Subcortical marrow edema in the fifth metatarsal head with corresponding T1 hypointensity is concerning for early osteomyelitis of the plantar lateral aspect of the fifth metatarsal head. Chest x-ray done on 01/14/2015 shows bronchiectatic changes without infiltrate. EKG done on generally 17 2017 shows a normal sinus rhythm and is a normal EKG. 02/04/2015 -- he was asked to see Dr. [name] last week and had 2 appointments but had to cancel both due to pressures of work. Last night he has woken up with severe pain in the foot and leg and it is swollen up. No fever or no change in his blood glucose. Addendum: I spoke with Dr. [name] who kindly agreed to accept the patient for inpatient therapy and have also opened to the hospitalist Dr. [name], and discuss details of the management including PICC line and repeat cultures. 02/12/2015-- -- was seen by Dr. [name] in the hospital and a PICC line was placed. He was to receive Ceftazidime 2  g every 12 hourly,  oral levofloxacin 750 mg every 24 hourly and oral fluconazole 200 mg daily. The antibiotics were to be given for 4 weeks except the Diflucan was to be given for the first 2 weeks. Reviewed note from 02/10/2015 -- and Dr. [name] had recommended management for growth of MSSA and Serratia. He switched him from ceftazidime to ceftriaxone 2 g every 24 hours. Levofloxacin was stopped and he would continue on fluconazole for another week. He had asked me to decide whether further imaging was necessary and whether surgical debridement of the infected bone was needed. He is doing well and has been off work for this week and we will keep him off the next week. 02/22/2015 -- he was seen by my colleague on 01/19/2015 and at that time an incision and drainage was done on his right lateral forefoot on the dorsum. Today when I probed this wound it is frankly draining pus and it communicates with the ulcer on the plantar aspect of his right foot. The patient is still on IV ceftriaxone 2 g every 24 hours and is to be seen by Dr. [name] on Friday. [name], [name] ([id number]) 03/19/2015 -- On 03/04/2015 I spoke to Dr. 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 He recommended operative debridement which would probably result in the fifth metatarsal head and toe amputation.The patient would be referred back to [place] once he was done with surgery. He was admitted to [facility] yesterday and had surgery done by podiatry for a right fifth metatarsal acute osteomyelitis with cellulitis and abscess. He had a right foot incision and drainage with fifth metatarsal partial amputation and removal of toe infected bone and soft tissue with cultures. The wound was partially closed and packing of the distal end was done. Patient was already on cefepime 2  g IV every 8 hourly and put on vancomycin pending final cultures. He had grown moderate gram-negative rods, and later found to be rare diphtheroids. I received a call from Dr. Cannon Kettle the podiatrist and we discussed the above. On 03/10/2015 he was found positive for influenza a and has been put on Tamiflu. Since his discharge he has been seen by Dr. Cannon Kettle who is planning to remove his sutures this coming week. He was reviewed by Dr. [name] on 03/17/2015 and his Diflucan was stopped and vancomycin. After the 3 doses he is taking. He is going to change Ceftazidime to Zosyn 3.375 g IV every 8 hours. 03/25/2015 -- he was seen by the podiatrist a couple of days ago and the sutures were removed. She will follow back with him in 4 weeks' time and at that time x-rays will be taken and a custom molded insert would be made for his shoe. 04/01/2015 -- he was seen by Dr. [name] on 03/29/2015 who pulled the PICC line stopped his IV antibiotics and recommended starting doxycycline and levofloxacin for 2 weeks. He also stop the fluconazole. The patient will follow up with him only when necessary. Readmission: 03/15/17 on evaluation today patient presents for initial evaluation concerning the new issues although he has previously been evaluated in our clinic. Unfortunately the previous evaluation led to the patient having to proceed to amputation and so he is somewhat nervous about being here today. With that being said he has a very  slight blister that occurred on the plantar aspect more medial on the right great toe that has been present for just a very short amount of time, several days. With that being said he felt like initially when he called that he somewhat overreacted but due to the fact that his previous issue led to amputation he is very cautious these days I explained that he did the right thing. With that being said he has been tolerating the dressing changes without complication mainly he  just been covering this. He does not have any discomfort in secondary to neuropathy it's unlikely that he feels much. With that being said he does tell me that the issue that he had here is that he went barefoot when he knows he should not have which subsequently led to the blisters. He states is definitely not doing that anymore. His most recent hemoglobin A1c was December and registered 6.9 his ABI today was 1.1. 03/20/17 on evaluation today patient appears to be doing excellent in regard to his great toe ulcer. He has been tolerating the dressing changes without complication. There does not appear to be any evidence of infection and he seems to have good epithelialization. Overall I'm pleased with the progress that he has made since the last week. Patient likewise is pleased as well. Fortunately there is no evidence of erythema or any sign of infection. Electronic Signature(s) Signed: 03/21/2017 4:20:45 AM By: [name] PA-C Entered By: [name] on 03/20/2017 10:28:47 [name] ([id number]) -------------------------------------------------------------------------------- Callus Pairing Details Patient Name: KAGE, WILLMANN Date of Service: 03/20/2017 10:00 AM Medical Record Number: [id number] Patient Account Number: [id number] Date of Birth/Sex: 1965/02/13 (52 y.o. M) Treating RN: Cornell Barman Primary Care Provider: [name] Other Clinician: Referring Provider: [name] Treating Provider/Extender: Melburn Hake, Arnelle Nale Weeks in Treatment: 0 Procedure Performed for: Wound #3 Right Toe Great Performed By: Physician STONE III, Shirlee Latch., PA-C Post Procedure Diagnosis Same as Pre-procedure Notes Callus pairing with #3 Curette Electronic Signature(s) Signed: 03/23/2017 4:43:23 PM By: Gretta Cool, BSN, RN, CWS, Kim RN, BSN Entered By: Gretta Cool, BSN, RN, CWS, Kim on 03/20/2017 10:19:12 [name]  ([id number]) -------------------------------------------------------------------------------- Physical Exam Details Patient Name: [name], [name] Date of Service: 03/20/2017 10:00 AM Medical Record Number: [id number] Patient Account Number: [id number] Date of Birth/Sex: [birth year] (51 y.o. M) Treating RN: Cornell Barman Primary Care Provider: [name] Other Clinician: Referring Provider: [name] Treating Provider/Extender: STONE III, Storm Sovine Weeks in Treatment: 0 Constitutional Well-nourished and well-hydrated in no acute distress. Respiratory normal breathing without difficulty. Psychiatric this patient is able to make decisions and demonstrates good insight into disease process. Alert and Oriented x 3. pleasant and cooperative. Notes Patient's wound on evaluation today shows an excellent granulation bed with good epithelialization occurring in migrating along the edges of the ulcer. He has been tolerating the dressing changes without complication and overall this seems to be in well no debridement necessary today he did have a desire to have me work on a little bit of the callous on the medial aspect just to the side of the ulcer and I did clear away this callous today. Patient tolerated this without any pain there was no bleeding post callous pairing this appeared to be much better and smoother. Electronic Signature(s) Signed: 03/21/2017 4:20:45 AM By: [name] PA-C Entered By: [name] on 03/20/2017 10:31:09 [name] ([id number]) -------------------------------------------------------------------------------- Physician Orders Details Patient Name: [name] Date of Service: 03/20/2017 10:00 AM Medical  Record Number: [id number] Patient Account Number: [id number] Date of Birth/Sex: [birth year] (52 y.o. M) Treating RN: Cornell Barman Primary Care Provider: [name] Other Clinician: Referring Provider: [name] Treating  Provider/Extender: Melburn Hake, Awesome Jared Weeks in Treatment: 0 Verbal / Phone Orders: No Diagnosis Coding ICD-10 Coding Code Description E11.621 Type 2 diabetes mellitus with foot ulcer L97.512 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d L84 Corns and callosities Wound Cleansing Wound #3 Right Toe Great o Clean wound with wound cleanser. o Cleanse wound with mild soap and water o May Shower, gently pat wound dry prior to applying new dressing. Anesthetic (add to Medication List) Wound #3 Right Toe Great o Topical Lidocaine 4% cream applied to wound bed prior to debridement (In Clinic Only). Primary Wound Dressing Wound #3 Right Toe Great o Prisma Ag Secondary Dressing Wound #3 Right Toe Great o Conform/Kerlix o Foam Dressing Change Frequency Wound #3 Right Toe Great o Change dressing every day. Follow-up Appointments Wound #3 Right Toe Great o Return Appointment in 1 week. Additional Orders / Instructions Wound #3 Right Toe Great o Increase protein intake. Electronic Signature(s) Signed: 03/21/2017 4:20:45 AM By: Cannon Kettle ([id number]) Signed: 03/23/2017 4:43:23 PM By: Gretta Cool, BSN, RN, CWS, Kim RN, BSN Entered By: Gretta Cool, BSN, RN, CWS, Kim on 03/20/2017 10:19:52 [name], [name] ([id number]) -------------------------------------------------------------------------------- Problem List Details Patient Name: [name], [name]. Date of Service: 03/20/2017 10:00 AM Medical Record Number: [id number] Patient Account Number: [id number] Date of Birth/Sex: Jun 21, 1965 (51 y.o. M) Treating RN: Cornell Barman Primary Care Provider: [name] Other Clinician: Referring Provider: [name] Treating Provider/Extender: Melburn Hake, Hai Grabe Weeks in Treatment: 0 Active Problems ICD-10 Impacting Encounter Code Description Active Date Wound Healing Diagnosis E11.621 Type 2 diabetes mellitus with  foot ulcer 03/15/2017 Yes L97.512 Non-pressure chronic ulcer of other part of right foot with fat 03/15/2017 Yes layer exposed L84 Corns and callosities 03/15/2017 Yes Inactive Problems Resolved Problems Electronic Signature(s) Signed: 03/21/2017 4:20:45 AM By: [name] PA-C Entered By: [name] on 03/20/2017 09:47:55 [name] ([id number]) -------------------------------------------------------------------------------- Progress Note Details Patient Name: [name] Date of Service: 03/20/2017 10:00 AM Medical Record Number: [id number] Patient Account Number: [id number] Date of Birth/Sex: [birth year] (52 y.o. M) Treating RN: Cornell Barman Primary Care Provider: [name] Other Clinician: Referring Provider: [name] Treating Provider/Extender: Melburn Hake, Yomaira Solar Weeks in Treatment: 0 Subjective Chief Complaint Information obtained from Patient Right great toe ulcer History of Present Illness (HPI) This 52 year old male comes with an ulcerated area on the plantar aspect of the right foot which she's had for approximately a month. I have known him from a previous visit at [facility] wound center and was treated in the months of April and May 2016 and rapidly healed a left plantar ulcer with a total contact cast. He has been a diabetic for about 16 years and tries to keep active and is fairly compliant with his diabetes management. 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 Past medical history significant for hypertension, hyperlipidemia, and status post appendectomy 1993. He does not smoke or drink alcohol. 01/14/2015 -- the patient had tolerated his total contact cast very well and had no problems and has had no systemic symptoms. However when his total contact cast was cut open he had excessive amount of purulent drainage in spite of being on antibiotics. He had had a recent x-ray done in the ER 12/22/2014 which showed IMPRESSION:No evidence  of osseous  erosion. Known soft tissue ulceration is not well characterized on radiograph. Scattered vascular calcifications seen. his last hemoglobin A1c in December was 7.3. He has been on Augmentin and doxycycline for the last 2 weeks. 01/21/2015 -- his culture grew rare growth of Pantoea species an MR moderate growth of Candida parapsilosis. it is sensitive to levofloxacin. He has not heard back from the insurance company regarding his hyperbaric oxygen therapy. His MRI has not been done yet and we will try and get him an earlier date 01/28/2015 -- MRI was done last night -- IMPRESSION:1. Soft tissue ulcer overlying the plantar aspect of the fifth metatarsal head extending to the cortex. Subcortical marrow edema in the fifth metatarsal head with corresponding T1 hypointensity is concerning for early osteomyelitis of the plantar lateral aspect of the fifth metatarsal head. Chest x-ray done on 01/14/2015 shows bronchiectatic changes without infiltrate. EKG done on generally 17 2017 shows a normal sinus rhythm and is a normal EKG. 02/04/2015 -- he was asked to see Dr. [name] last week and had 2 appointments but had to cancel both due to pressures of work. Last night he has woken up with severe pain in the foot and leg and it is swollen up. No fever or no change in his blood glucose. Addendum: I spoke with Dr. [name] who kindly agreed to accept the patient for inpatient therapy and have also opened to the hospitalist Dr. [name], and discuss details of the management including PICC line and repeat cultures. 02/12/2015-- -- was seen by Dr. [name] in the hospital and a PICC line was placed. He was to receive Ceftazidime 2 g every 12 hourly, oral levofloxacin 750 mg every 24 hourly and oral fluconazole 200 mg daily. The antibiotics were to be given for 4 weeks except the Diflucan was to be given for the first 2 weeks. Reviewed note from 02/10/2015 -- and Dr. [name] had recommended  management for growth of MSSA and Serratia. He switched him from ceftazidime to ceftriaxone 2 g every 24 hours. Levofloxacin was stopped and he would continue on fluconazole for another week. He had asked me to decide whether further imaging was necessary and whether surgical debridement of the infected bone was needed. [name], [name] ([id number]) He is doing well and has been off work for this week and we will keep him off the next week. 02/22/2015 -- he was seen by my colleague on 01/19/2015 and at that time an incision and drainage was done on his right lateral forefoot on the dorsum. Today when I probed this wound it is frankly draining pus and it communicates with the ulcer on the plantar aspect of his right foot. The patient is still on IV ceftriaxone 2 g every 24 hours and is to be seen by Dr. [name] on Friday. 03/19/2015 -- On 03/04/2015 I spoke to Dr. 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 He recommended operative debridement which would probably result in the fifth metatarsal head and toe amputation.The patient would be referred back to [place] once he was done with surgery. He was admitted to [facility] yesterday and had surgery done by podiatry for a right fifth metatarsal acute osteomyelitis with cellulitis and abscess. He had a right foot incision and drainage with fifth metatarsal partial amputation and removal of toe infected bone and  soft tissue with cultures. The wound was partially closed and packing of the distal end was done. Patient was already on cefepime 2 g IV every 8 hourly and put on vancomycin pending final cultures. He had grown moderate gram-negative rods, and later found to be rare diphtheroids. I received a call from Dr. Cannon Kettle the podiatrist and we discussed the above. On 03/10/2015 he was  found positive for influenza a and has been put on Tamiflu. Since his discharge he has been seen by Dr. Cannon Kettle who is planning to remove his sutures this coming week. He was reviewed by Dr. [name] on 03/17/2015 and his Diflucan was stopped and vancomycin. After the 3 doses he is taking. He is going to change Ceftazidime to Zosyn 3.375 g IV every 8 hours. 03/25/2015 -- he was seen by the podiatrist a couple of days ago and the sutures were removed. She will follow back with him in 4 weeks' time and at that time x-rays will be taken and a custom molded insert would be made for his shoe. 04/01/2015 -- he was seen by Dr. [name] on 03/29/2015 who pulled the PICC line stopped his IV antibiotics and recommended starting doxycycline and levofloxacin for 2 weeks. He also stop the fluconazole. The patient will follow up with him only when necessary. Readmission: 03/15/17 on evaluation today patient presents for initial evaluation concerning the new issues although he has previously been evaluated in our clinic. Unfortunately the previous evaluation led to the patient having to proceed to amputation and so he is somewhat nervous about being here today. With that being said he has a very slight blister that occurred on the plantar aspect more medial on the right great toe that has been present for just a very short amount of time, several days. With that being said he felt like initially when he called that he somewhat overreacted but due to the fact that his previous issue led to amputation he is very cautious these days I explained that he did the right thing. With that being said he has been tolerating the dressing changes without complication mainly he just been covering this. He does not have any discomfort in secondary to neuropathy it's unlikely that he feels much. With that being said he does tell me that the issue that he had here is that he went barefoot when he knows he should not have which  subsequently led to the blisters. He states is definitely not doing that anymore. His most recent hemoglobin A1c was December and registered 6.9 his ABI today was 1.1. 03/20/17 on evaluation today patient appears to be doing excellent in regard to his great toe ulcer. He has been tolerating the dressing changes without complication. There does not appear to be any evidence of infection and he seems to have good epithelialization. Overall I'm pleased with the progress that he has made since the last week. Patient likewise is pleased as well. Fortunately there is no evidence of erythema or any sign of infection. Patient History Information obtained from Patient. Family History Cancer - Father, Diabetes - Father, Hypertension - Father, No family history of Heart Disease, Hereditary Spherocytosis, Kidney Disease, Lung Disease, Seizures, Stroke, Thyroid Problems, Tuberculosis. Social History Never smoker, Marital Status - Married, Alcohol Use - Never, Drug Use - No History, Caffeine Use - Daily. Review of Systems (ROS) Constitutional Symptoms (General Health) [name], [name] ([id number]) Denies complaints or symptoms of Fever, Chills. Respiratory The patient has no complaints or symptoms. Cardiovascular Complains or  has symptoms of LE edema. Psychiatric The patient has no complaints or symptoms. Objective Constitutional Well-nourished and well-hydrated in no acute distress. Vitals Time Taken: 10:07 AM, Height: 74 in, Weight: 300 lbs, BMI: 38.5, Temperature: 98.1 F, Pulse: 86 bpm, Respiratory Rate: 16 breaths/min, Blood Pressure: 135/75 mmHg. Respiratory normal breathing without difficulty. Psychiatric this patient is able to make decisions and demonstrates good insight into disease process. Alert and Oriented x 3. pleasant and cooperative. General Notes: Patient's wound on evaluation today shows an excellent granulation bed with good epithelialization occurring in migrating along  the edges of the ulcer. He has been tolerating the dressing changes without complication and overall this seems to be in well no debridement necessary today he did have a desire to have me work on a little bit of the callous on the medial aspect just to the side of the ulcer and I did clear away this callous today. Patient tolerated this without any pain there was no bleeding post callous pairing this appeared to be much better and smoother. Integumentary (Hair, Skin) Wound #3 status is Open. Original cause of wound was Trauma. The wound is located on the Right Toe Great. The wound measures 0.3cm length x 0.5cm width x 0.1cm depth; 0.118cm^2 area and 0.012cm^3 volume. There is Fat Layer (Subcutaneous Tissue) Exposed exposed. There is a medium amount of serous drainage noted. The wound margin is flat and intact. There is large (67-100%) pink granulation within the wound bed. There is no necrotic tissue within the wound bed. The periwound skin appearance exhibited: Callus, Erythema. The periwound skin appearance did not exhibit: Crepitus, Excoriation, Induration, Rash, Scarring, Dry/Scaly, Maceration, Atrophie Blanche, Cyanosis, Ecchymosis, Hemosiderin Staining, Mottled, Pallor, Rubor. The surrounding wound skin color is noted with erythema which is circumferential. Periwound temperature was noted as No Abnormality. Assessment Active Problems ICD-10 E11.621 - Type 2 diabetes mellitus with foot ulcer [name], [name] ([id number]) L97.512 - 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d L84 - Corns and callosities Procedures Wound #3 Pre-procedure diagnosis of Wound #3 is a Diabetic Wound/Ulcer of the Lower Extremity located on the Right Toe Great . An Callus Pairing procedure was performed by STONE III, Lavoy Bernards E., PA-C. Post procedure Diagnosis Wound #3: Same as Pre-Procedure Notes: Callus pairing with #3 Curette Plan Wound Cleansing: Wound #3 Right Toe  Great: Clean wound with wound cleanser. Cleanse wound with mild soap and water May Shower, gently pat wound dry prior to applying new dressing. Anesthetic (add to Medication List): Wound #3 Right Toe Great: Topical Lidocaine 4% cream applied to wound bed prior to debridement (In Clinic Only). Primary Wound Dressing: Wound #3 Right Toe Great: Prisma Ag Secondary Dressing: Wound #3 Right Toe Great: Conform/Kerlix Foam Dressing Change Frequency: Wound #3 Right Toe Great: Change dressing every day. Follow-up Appointments: Wound #3 Right Toe Great: Return Appointment in 1 week. Additional Orders / Instructions: Wound #3 Right Toe Great: Increase protein intake. At this time I'm gonna recommend that we continue with the Prisma and fortunately after I peered away at the callous this does appear to be much better on the medial aspect of the right great toe. Overall I think this is progressing just how we would like it to be and I'm pleased. We are gonna see were things stand in one weeks time and in the meantime patient will continue to follow the current directions as we discussed last visit. [name], [name] ([id number]) Please see above for specific wound care  orders. We will see patient for re-evaluation in 1 week(s) here in the clinic. If anything worsens or changes patient will contact our office for additional recommendations. Electronic Signature(s) Signed: 03/21/2017 4:20:45 AM By: [name] PA-C Entered By: [name] on 03/20/2017 10:32:05 [name] ([id number]) -------------------------------------------------------------------------------- ROS/PFSH Details Patient Name: [name] Date of Service: 03/20/2017 10:00 AM Medical Record Number: [id number] Patient Account Number: [id number] Date of Birth/Sex: 06/08/1965 (52 y.o. 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 (MRSA, VRE)o Yes Have you tested positive for osteomyelitis (bone infection)o Yes Have you had any tests for circulation on your legso No Constitutional Symptoms (General Health) Complaints and Symptoms: Negative for: Fever; Chills Cardiovascular Complaints and Symptoms: Positive for: LE edema Medical History: Positive for: Hypertension Negative for: Angina; Arrhythmia; Congestive Heart Failure; Coronary Artery Disease; Myocardial Infarction; Peripheral Arterial Disease; Peripheral Venous Disease; Phlebitis; Vasculitis Eyes Medical History: Negative for: Cataracts; Glaucoma; Optic Neuritis Ear/Nose/Mouth/Throat Medical History: Negative for: Chronic sinus problems/congestion; Middle ear problems Hematologic/Lymphatic Medical History: Negative for: Anemia; Hemophilia; Human Immunodeficiency Virus; Lymphedema; Sickle Cell Disease Respiratory Complaints and Symptoms: No Complaints or Symptoms Medical History: Negative for: Aspiration; Asthma; Chronic Obstructive Pulmonary Disease (COPD); Pneumothorax; Sleep Apnea; [name], [name] ([id number]) Tuberculosis Gastrointestinal Medical History: Negative for: Cirrhosis ; Colitis; Crohnos; Hepatitis A; Hepatitis B; Hepatitis C Endocrine Medical History: Positive for: Type II Diabetes Time with diabetes: 13 years Treated with: Insulin, Oral agents Blood sugar tested every day: No Blood sugar testing  results: Breakfast: 141 Genitourinary Medical History: Negative for: End Stage Renal Disease Immunological Medical History: Negative for: Lupus Erythematosus; Raynaudos; Scleroderma Integumentary (Skin) Medical History: Negative for: History of Burn; History of pressure wounds Musculoskeletal Medical History: Negative for: Gout; Rheumatoid Arthritis; Osteoarthritis; Osteomyelitis Neurologic Medical History: Positive for: Neuropathy Oncologic Medical History: Negative for: Received Chemotherapy; Received Radiation Psychiatric Complaints and Symptoms: No Complaints or Symptoms Medical History: Negative for: Anorexia/bulimia; Confinement Anxiety Immunizations Pneumococcal Vaccine: Received Pneumococcal Vaccination: No [name], [name] ([id number]) Implantable Devices Family and Social History Cancer: Yes - Father; Diabetes: Yes - Father; Heart Disease: No; Hereditary Spherocytosis: No; Hypertension: Yes - Father; Kidney Disease: No; Lung Disease: No; Seizures: No; Stroke: No; Thyroid Problems: No; Tuberculosis: No; Never smoker; Marital Status - Married; Alcohol Use: Never; Drug Use: No History; Caffeine Use: Daily; Financial Concerns: No; Food, Clothing or Shelter Needs: No; Support System Lacking: No; Transportation Concerns: No; Advanced Directives: No; Patient does not want information on Advanced Directives; Living Will: No Physician Affirmation I have reviewed and agree with the above information. Electronic Signature(s) Signed: 03/21/2017 4:20:45 AM By: [name] PA-C Signed: 03/23/2017 4:43:23 PM By: Gretta Cool BSN, RN, CWS, Kim RN, BSN Entered By: [name] on 03/20/2017 10:30:12 [name], GRIEGER ([id number]) -------------------------------------------------------------------------------- SuperBill Details Patient Name: EDENILSON, AUSTAD. Date of Service: 03/20/2017 Medical Record Number: [id number] Patient Account Number: [id number] Date of  Birth/Sex: 02-01-1965 (52 y.o. M) Treating RN: Cornell Barman  Primary Care Provider: [name] Other Clinician: Referring Provider: [name] Treating Provider/Extender: Melburn Hake, Bexton Haak Weeks in Treatment: 0 Diagnosis Coding ICD-10 Codes Code Description E11.621 Type 2 diabetes mellitus with foot ulcer L97.512 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d L84 Corns and callosities Facility Procedures CPT4 Code: [id number] Description: 24825 - PARE BENIGN LES; SGL ICD-10 Diagnosis Description L84 Corns and callosities Modifier: Quantity: 1 Physician Procedures CPT4 Code: [id number] Description: 88916 - WC PHYS PARE BENIGN LES; SGL ICD-10 Diagnosis Description L84 Corns and callosities Modifier: Quantity: 1 Electronic Signature(s) Signed: 03/21/2017 4:20:45 AM By: [name] PA-C Entered By: [name] on 03/20/2017 10:32:22

## 2017-03-27 ENCOUNTER — Encounter: Payer: Managed Care, Other (non HMO) | Admitting: Physician Assistant

## 2017-03-27 DIAGNOSIS — E11621 Type 2 diabetes mellitus with foot ulcer: Secondary | ICD-10-CM | POA: Diagnosis not present

## 2017-03-29 NOTE — Progress Notes (Signed)
[name] ([id number]) Visit Report for 03/27/2017 Chief Complaint Document Details Patient Name: [name], [name]. Date of Service: 03/27/2017 10:15 AM Medical Record Number: [id number] Patient Account Number: [id number] Date of Birth/Sex: September 04, 1965 (52 y.o. M) Treating RN: Ahmed Prima Primary Care Provider: [name] Other Clinician: Referring Provider: [name] Treating Provider/Extender: Melburn Hake, HOYT Weeks in Treatment: 1 Information Obtained from: Patient Chief Complaint Right great toe ulcer Electronic Signature(s) Signed: 03/28/2017 12:10:29 AM By: [name] PA-C Entered By: [name] on 03/27/2017 10:12:32 [name] ([id number]) -------------------------------------------------------------------------------- HPI Details Patient Name: [name] Date of Service: 03/27/2017 10:15 AM Medical Record Number: [id number] Patient Account Number: [id number] Date of Birth/Sex: [birth year] (52 y.o. M) Treating RN: Ahmed Prima Primary Care Provider: [name] Other Clinician: Referring Provider: [name] Treating Provider/Extender: STONE III, HOYT Weeks in Treatment: 1 History of Present Illness HPI Description: This 52 year old male comes with an ulcerated area on the plantar aspect of the right foot which she's had for approximately a month. I have known him from a previous visit at [facility] wound center and was treated in the months of April and May 2016 and rapidly healed a left plantar ulcer with a total contact cast. He has been a diabetic for about 16 years and tries to keep active and is fairly compliant with his diabetes management. 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 Past medical history significant for hypertension, hyperlipidemia, and status post appendectomy 1993. He does not smoke or drink alcohol. 01/14/2015 -- the patient had tolerated his total contact cast very well and had  no problems and has had no systemic symptoms. However when his total contact cast was cut open he had excessive amount of purulent drainage in spite of being on antibiotics. He had had a recent x-ray done in the ER 12/22/2014 which showed IMPRESSION:No evidence of osseous erosion. Known soft tissue ulceration is not well characterized on radiograph. Scattered vascular calcifications seen. his last hemoglobin A1c in December was 7.3. He has been on Augmentin and doxycycline for the last 2 weeks. 01/21/2015 -- his culture grew rare growth of Pantoea species an MR moderate growth of Candida parapsilosis. it is sensitive to levofloxacin. He has not heard back from the insurance company regarding his hyperbaric oxygen therapy. His MRI has not been done yet and we will try and get him an earlier date 01/28/2015 -- MRI was done last night -- IMPRESSION:1. Soft tissue ulcer overlying the plantar aspect of the fifth metatarsal head extending to the cortex. Subcortical marrow edema in the fifth metatarsal head with corresponding T1 hypointensity is concerning for early osteomyelitis of the plantar lateral aspect of the fifth metatarsal head. Chest x-ray done on 01/14/2015 shows bronchiectatic changes without infiltrate. EKG done on generally 17 2017 shows a normal sinus rhythm and is a normal EKG. 02/04/2015 -- he was asked to see Dr. [name] last week and had 2 appointments but had to cancel both due to pressures of work. Last night he has woken up with severe pain in the foot and leg and it is swollen up. No fever or no change in his blood glucose. Addendum: I spoke with Dr. [name] who kindly agreed to accept the patient for inpatient therapy and have also opened to the hospitalist Dr. [name], and discuss details of the management including PICC line and repeat cultures. 02/12/2015-- -- was seen by Dr. [name] in the hospital and a PICC line was placed. He was to receive Ceftazidime 2  g  every 12 hourly, oral levofloxacin 750 mg every 24 hourly and oral fluconazole 200 mg daily. The antibiotics were to be given for 4 weeks except the Diflucan was to be given for the first 2 weeks. Reviewed note from 02/10/2015 -- and Dr. [name] had recommended management for growth of MSSA and Serratia. He switched him from ceftazidime to ceftriaxone 2 g every 24 hours. Levofloxacin was stopped and he would continue on fluconazole for another week. He had asked me to decide whether further imaging was necessary and whether surgical debridement of the infected bone was needed. He is doing well and has been off work for this week and we will keep him off the next week. 02/22/2015 -- he was seen by my colleague on 01/19/2015 and at that time an incision and drainage was done on his right lateral forefoot on the dorsum. Today when I probed this wound it is frankly draining pus and it communicates with the ulcer on the plantar aspect of his right foot. The patient is still on IV ceftriaxone 2 g every 24 hours and is to be seen by Dr. [name] on Friday. [name], [name] ([id number]) 03/19/2015 -- On 03/04/2015 I spoke to Dr. 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 He recommended operative debridement which would probably result in the fifth metatarsal head and toe amputation.The patient would be referred back to [place] once he was done with surgery. He was admitted to [facility] yesterday and had surgery done by podiatry for a right fifth metatarsal acute osteomyelitis with cellulitis and abscess. He had a right foot incision and drainage with fifth metatarsal partial amputation and removal of toe infected bone and soft tissue with cultures. The wound was partially closed and packing of the distal end was done. Patient was  already on cefepime 2 g IV every 8 hourly and put on vancomycin pending final cultures. He had grown moderate gram-negative rods, and later found to be rare diphtheroids. I received a call from Dr. Cannon Kettle the podiatrist and we discussed the above. On 03/10/2015 he was found positive for influenza a and has been put on Tamiflu. Since his discharge he has been seen by Dr. Cannon Kettle who is planning to remove his sutures this coming week. He was reviewed by Dr. [name] on 03/17/2015 and his Diflucan was stopped and vancomycin. After the 3 doses he is taking. He is going to change Ceftazidime to Zosyn 3.375 g IV every 8 hours. 03/25/2015 -- he was seen by the podiatrist a couple of days ago and the sutures were removed. She will follow back with him in 4 weeks' time and at that time x-rays will be taken and a custom molded insert would be made for his shoe. 04/01/2015 -- he was seen by Dr. [name] on 03/29/2015 who pulled the PICC line stopped his IV antibiotics and recommended starting doxycycline and levofloxacin for 2 weeks. He also stop the fluconazole. The patient will follow up with him only when necessary. Readmission: 03/15/17 on evaluation today patient presents for initial evaluation concerning the new issues although he has previously been evaluated in our clinic. Unfortunately the previous evaluation led to the patient having to proceed to amputation and so he is somewhat nervous about being here today. With that being said he has a very  slight blister that occurred on the plantar aspect more medial on the right great toe that has been present for just a very short amount of time, several days. With that being said he felt like initially when he called that he somewhat overreacted but due to the fact that his previous issue led to amputation he is very cautious these days I explained that he did the right thing. With that being said he has been tolerating the dressing changes without  complication mainly he just been covering this. He does not have any discomfort in secondary to neuropathy it's unlikely that he feels much. With that being said he does tell me that the issue that he had here is that he went barefoot when he knows he should not have which subsequently led to the blisters. He states is definitely not doing that anymore. His most recent hemoglobin A1c was December and registered 6.9 his ABI today was 1.1. 03/27/17 on evaluation today patient appears to be doing great in regard to his right great toe ulcer. He has been tolerating the dressing changes without complication. The good news is this is making excellent progress and he seems to be caring for this in an excellent fashion. I see no evidence of breakdown that would make me concerned that he was at risk for infection/amputation. This has obviously been his concern due to the fifth toe amputation that was necessitated previous when he had a similar issue. Nonetheless this seems to be progressing much more nicely. Electronic Signature(s) Signed: 03/28/2017 12:10:29 AM By: [name] PA-C Entered By: [name] on 03/27/2017 11:18:03 [name] ([id number]) -------------------------------------------------------------------------------- Physical Exam Details Patient Name: [name], [name] Date of Service: 03/27/2017 10:15 AM Medical Record Number: [id number] Patient Account Number: [id number] Date of Birth/Sex: Jun 20, 1965 (52 y.o. M) Treating RN: Ahmed Prima Primary Care Provider: [name] Other Clinician: Referring Provider: [name] Treating Provider/Extender: STONE III, HOYT Weeks in Treatment: 1 Constitutional Well-nourished and well-hydrated in no acute distress. Respiratory normal breathing without difficulty. clear to auscultation bilaterally. Cardiovascular regular rate and rhythm with normal S1, S2. Psychiatric this patient is able to make decisions and  demonstrates good insight into disease process. Alert and Oriented x 3. pleasant and cooperative. Notes At this point I suggested to the patient that no debridement was necessary currently. The wound appears to be doing well and after cleansing with saline and gauze he did not appear to require any additional sharp debridement. He just has a very small opening remaining. Electronic Signature(s) Signed: 03/28/2017 12:10:29 AM By: [name] PA-C Entered By: [name] on 03/27/2017 11:19:38 [name] ([id number]) -------------------------------------------------------------------------------- Physician Orders Details Patient Name: [name] Date of Service: 03/27/2017 10:15 AM Medical Record Number: [id number] Patient Account Number: [id number] Date of Birth/Sex: 10/21/1965 (52 y.o. M) Treating RN: Ahmed Prima Primary Care Provider: [name] Other Clinician: Referring Provider: [name] Treating Provider/Extender: Melburn Hake, HOYT Weeks in Treatment: 1 Verbal / Phone Orders: Yes Clinician: Pinkerton, Debi Read Back and Verified: Yes Diagnosis Coding ICD-10 Coding Code Description E11.621 Type 2 diabetes mellitus with foot ulcer L97.512 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d L84 Corns and callosities Wound Cleansing Wound #3 Right Toe Great o Cleanse wound with mild soap and water o May Shower, gently pat wound dry prior to applying new dressing. Anesthetic (add to Medication List) Wound #3 Right Toe Great o Topical Lidocaine 4% cream applied to wound  bed prior to debridement (In Clinic Only). Primary Wound Dressing Wound #3 Right Toe Great o Silver Collagen - prisma ag Secondary Dressing Wound #3 Right Toe Great o Conform/Kerlix o Foam Dressing Change Frequency Wound #3 Right Toe Great o Change dressing every day. Follow-up Appointments Wound #3 Right Toe Great o  Return Appointment in 1 week. Off-Loading Wound #3 Right Toe Great o Other: - keep pressure off of wound area as much as possible Additional Orders / Instructions Wound #3 Right Toe Great o Increase protein intake. DONZEL, ROMACK ([id number]) Patient Medications Allergies: No Known Allergies Notifications Medication Indication Start End lidocaine DOSE 1 - topical 4 % cream - 1 cream topical Electronic Signature(s) Signed: 03/28/2017 12:10:29 AM By: [name] PA-C Signed: 03/28/2017 4:37:47 PM By: Alric Quan Entered By: Alric Quan on 03/27/2017 10:28:00 HUSAYN, REIM ([id number]) -------------------------------------------------------------------------------- Prescription 03/27/2017 Patient Name: [name]. Provider: [name] PA-C Date of Birth: 09/12/1965 NPI#: [id number] Sex: Jerilynn Mages DEA#: [id number] Phone #: [phone] License #: Patient Address: [facility], [place] [address] [address], [place], [place] [address] [phone] Allergies No Known Allergies Medication Medication: Route: Strength: Form: lidocaine topical 4% cream Class: TOPICAL LOCAL ANESTHETICS Dose: Frequency / Time: Indication: 1 1 cream topical Number of Refills: Number of Units: 0 Generic Substitution: Start Date: End Date: Administered at [place]: Yes Time Administered: Time Discontinued: Note to Pharmacy: Signature(s): Date(s): Electronic Signature(s) Signed: 03/28/2017 12:10:29 AM By: [name] PA-C Signed: 03/28/2017 4:37:47 PM By: Alric Quan Entered By: Alric Quan on 03/27/2017 10:28:01 OBADIAH, DENNARD ([name] ([id number]) --------------------------------------------------------------------------------  Problem List Details Patient Name: JARREL, KNOKE. Date of Service: 03/27/2017 10:15 AM Medical Record Number: [id number] Patient Account Number: [id number] Date of Birth/Sex: 1965/04/04 (52 y.o. M) Treating RN: Ahmed Prima Primary Care Provider: [name] Other Clinician: Referring Provider: [name] Treating Provider/Extender: Melburn Hake, HOYT Weeks in Treatment: 1 Active Problems ICD-10 Impacting Encounter Code Description Active Date Wound Healing Diagnosis E11.621 Type 2 diabetes mellitus with foot ulcer 03/15/2017 Yes L97.512 Non-pressure chronic ulcer of other part of right foot with fat 03/15/2017 Yes layer exposed L84 Corns and callosities 03/15/2017 Yes Inactive Problems Resolved Problems Electronic Signature(s) Signed: 03/28/2017 12:10:29 AM By: [name] PA-C Entered By: [name] on 03/27/2017 10:12:24 [name] ([id number]) -------------------------------------------------------------------------------- Progress Note Details Patient Name: [name] Date of Service: 03/27/2017 10:15 AM Medical Record Number: [id number] Patient Account Number: [id number] Date of Birth/Sex: [birth year] (52 y.o. M) Treating RN: Ahmed Prima Primary Care Provider: [name] Other Clinician: Referring Provider: [name] Treating Provider/Extender: Melburn Hake, HOYT Weeks in Treatment: 1 Subjective Chief Complaint Information obtained from Patient Right great toe ulcer History of Present Illness (HPI) This 52 year old male comes with an ulcerated area on the plantar aspect of the right foot which she's had for approximately a month. I have known him from a previous visit at [facility] wound center and was treated in the months of April and May 2016 and rapidly healed a left plantar ulcer with a total contact cast. He has been a diabetic for about 16 years and tries to keep active and is fairly compliant with his diabetes management. He has significant neuropathy of his  feet. Past medical history significant for hypertension, hyperlipidemia, and status post appendectomy 1993. He does not smoke or drink alcohol. 01/14/2015 -- the patient had  tolerated his total contact cast very well and had no problems and has had no systemic symptoms. However when his total contact cast was cut open he had excessive amount of purulent drainage in spite of being on antibiotics. He had had a recent x-ray done in the ER 12/22/2014 which showed IMPRESSION:No evidence of osseous erosion. Known soft tissue ulceration is not well characterized on radiograph. Scattered vascular calcifications seen. his last hemoglobin A1c in December was 7.3. He has been on Augmentin and doxycycline for the last 2 weeks. 01/21/2015 -- his culture grew rare growth of Pantoea species an MR moderate growth of Candida parapsilosis. it is sensitive to levofloxacin. He has not heard back from the insurance company regarding his hyperbaric oxygen therapy. His MRI has not been done yet and we will try and get him an earlier date 01/28/2015 -- MRI was done last night -- IMPRESSION:1. Soft tissue ulcer overlying the plantar aspect of the fifth metatarsal head extending to the cortex. Subcortical marrow edema in the fifth metatarsal head with corresponding T1 hypointensity is concerning for early osteomyelitis of the plantar lateral aspect of the fifth metatarsal head. Chest x-ray done on 01/14/2015 shows bronchiectatic changes without infiltrate. EKG done on generally 17 2017 shows a normal sinus rhythm and is a normal EKG. 02/04/2015 -- he was asked to see Dr. [name] last week and had 2 appointments but had to cancel both due to pressures of work. Last night he has woken up with severe pain in the foot and leg and it is swollen up. No fever or no change in his blood glucose. Addendum: I spoke with Dr. [name] who kindly agreed to accept the patient for inpatient therapy and have also opened to the  hospitalist Dr. [name], and discuss details of the management including PICC line and repeat cultures. 02/12/2015-- -- was seen by Dr. [name] in the hospital and a PICC line was placed. He was to receive Ceftazidime 2 g every 12 hourly, oral levofloxacin 750 mg every 24 hourly and oral fluconazole 200 mg daily. The antibiotics were to be given for 4 weeks except the Diflucan was to be given for the first 2 weeks. Reviewed note from 02/10/2015 -- and Dr. [name] had recommended management for growth of MSSA and Serratia. He switched him from ceftazidime to ceftriaxone 2 g every 24 hours. Levofloxacin was stopped and he would continue on fluconazole for another week. He had asked me to decide whether further imaging was necessary and whether surgical debridement of the infected bone was needed. [name], [name] ([id number]) He is doing well and has been off work for this week and we will keep him off the next week. 02/22/2015 -- he was seen by my colleague on 01/19/2015 and at that time an incision and drainage was done on his right lateral forefoot on the dorsum. Today when I probed this wound it is frankly draining pus and it communicates with the ulcer on the plantar aspect of his right foot. The patient is still on IV ceftriaxone 2 g every 24 hours and is to be seen by Dr. [name] on Friday. 03/19/2015 -- On 03/04/2015 I spoke to Dr. 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 He recommended operative debridement which would probably result in the fifth metatarsal head and toe amputation.The patient would  be referred back to [place] once he was done with surgery. He was admitted to [facility] yesterday and had surgery done by podiatry for a right fifth metatarsal acute osteomyelitis with cellulitis and abscess. He had a right  foot incision and drainage with fifth metatarsal partial amputation and removal of toe infected bone and soft tissue with cultures. The wound was partially closed and packing of the distal end was done. Patient was already on cefepime 2 g IV every 8 hourly and put on vancomycin pending final cultures. He had grown moderate gram-negative rods, and later found to be rare diphtheroids. I received a call from Dr. Cannon Kettle the podiatrist and we discussed the above. On 03/10/2015 he was found positive for influenza a and has been put on Tamiflu. Since his discharge he has been seen by Dr. Cannon Kettle who is planning to remove his sutures this coming week. He was reviewed by Dr. [name] on 03/17/2015 and his Diflucan was stopped and vancomycin. After the 3 doses he is taking. He is going to change Ceftazidime to Zosyn 3.375 g IV every 8 hours. 03/25/2015 -- he was seen by the podiatrist a couple of days ago and the sutures were removed. She will follow back with him in 4 weeks' time and at that time x-rays will be taken and a custom molded insert would be made for his shoe. 04/01/2015 -- he was seen by Dr. [name] on 03/29/2015 who pulled the PICC line stopped his IV antibiotics and recommended starting doxycycline and levofloxacin for 2 weeks. He also stop the fluconazole. The patient will follow up with him only when necessary. Readmission: 03/15/17 on evaluation today patient presents for initial evaluation concerning the new issues although he has previously been evaluated in our clinic. Unfortunately the previous evaluation led to the patient having to proceed to amputation and so he is somewhat nervous about being here today. With that being said he has a very slight blister that occurred on the plantar aspect more medial on the right great toe that has been present for just a very short amount of time, several days. With that being said he felt like initially when he called that he somewhat  overreacted but due to the fact that his previous issue led to amputation he is very cautious these days I explained that he did the right thing. With that being said he has been tolerating the dressing changes without complication mainly he just been covering this. He does not have any discomfort in secondary to neuropathy it's unlikely that he feels much. With that being said he does tell me that the issue that he had here is that he went barefoot when he knows he should not have which subsequently led to the blisters. He states is definitely not doing that anymore. His most recent hemoglobin A1c was December and registered 6.9 his ABI today was 1.1. 03/27/17 on evaluation today patient appears to be doing great in regard to his right great toe ulcer. He has been tolerating the dressing changes without complication. The good news is this is making excellent progress and he seems to be caring for this in an excellent fashion. I see no evidence of breakdown that would make me concerned that he was at risk for infection/amputation. This has obviously been his concern due to the fifth toe amputation that was necessitated previous when he had a similar issue. Nonetheless this seems to be progressing much more nicely. Patient History Information obtained from Patient. Family  History Cancer - Father, Diabetes - Father, Hypertension - Father, No family history of Heart Disease, Hereditary Spherocytosis, Kidney Disease, Lung Disease, Seizures, Stroke, Thyroid Problems, Tuberculosis. Social History Never smoker, Marital Status - Married, Alcohol Use - Never, Drug Use - No History, Caffeine Use - Daily. Review of Systems (ROS) [name], [name] ([id number]) Constitutional Symptoms (General Health) Denies complaints or symptoms of Fever, Chills. Respiratory The patient has no complaints or symptoms. Cardiovascular The patient has no complaints or symptoms. Psychiatric The patient has no  complaints or symptoms. Objective Constitutional Well-nourished and well-hydrated in no acute distress. Vitals Time Taken: 10:02 AM, Height: 74 in, Weight: 300 lbs, BMI: 38.5, Temperature: 98.2 F, Pulse: 72 bpm, Respiratory Rate: 18 breaths/min, Blood Pressure: 142/71 mmHg. Respiratory normal breathing without difficulty. clear to auscultation bilaterally. Cardiovascular regular rate and rhythm with normal S1, S2. Psychiatric this patient is able to make decisions and demonstrates good insight into disease process. Alert and Oriented x 3. pleasant and cooperative. General Notes: At this point I suggested to the patient that no debridement was necessary currently. The wound appears to be doing well and after cleansing with saline and gauze he did not appear to require any additional sharp debridement. He just has a very small opening remaining. Integumentary (Hair, Skin) Wound #3 status is Open. Original cause of wound was Trauma. The wound is located on the Right Toe Great. The wound measures 0.1cm length x 0.3cm width x 0.1cm depth; 0.024cm^2 area and 0.002cm^3 volume. There is Fat Layer (Subcutaneous Tissue) Exposed exposed. There is no tunneling or undermining noted. There is a medium amount of serous drainage noted. The wound margin is flat and intact. There is large (67-100%) pink granulation within the wound bed. There is no necrotic tissue within the wound bed. The periwound skin appearance exhibited: Callus, Erythema. The periwound skin appearance did not exhibit: Crepitus, Excoriation, Induration, Rash, Scarring, Dry/Scaly, Maceration, Atrophie Blanche, Cyanosis, Ecchymosis, Hemosiderin Staining, Mottled, Pallor, Rubor. The surrounding wound skin color is noted with erythema which is circumferential. Periwound temperature was noted as No Abnormality. Assessment Active Problems [name], [name] ([id number]) ICD-10 E11.621 - Type 2 diabetes mellitus with foot ulcer L97.512  - 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d L84 - Corns and callosities Plan Wound Cleansing: Wound #3 Right Toe Great: Cleanse wound with mild soap and water May Shower, gently pat wound dry prior to applying new dressing. Anesthetic (add to Medication List): Wound #3 Right Toe Great: Topical Lidocaine 4% cream applied to wound bed prior to debridement (In Clinic Only). Primary Wound Dressing: Wound #3 Right Toe Great: Silver Collagen - prisma ag Secondary Dressing: Wound #3 Right Toe Great: Conform/Kerlix Foam Dressing Change Frequency: Wound #3 Right Toe Great: Change dressing every day. Follow-up Appointments: Wound #3 Right Toe Great: Return Appointment in 1 week. Off-Loading: Wound #3 Right Toe Great: Other: - keep pressure off of wound area as much as possible Additional Orders / Instructions: Wound #3 Right Toe Great: Increase protein intake. The following medication(s) was prescribed: lidocaine topical 4 % cream 1 1 cream topical was prescribed at facility I'm going to suggest at this point in time that we actually continue with the Current wound care measures since he seems to be doing very well. Patient is in agreement with the plan. We will subsequently see were things stand in one weeks time we see him for reevaluation my hope is by that point this will be completely closed and he  will be doing well. If anything changes in the meantime he will let me know. Please see above for specific wound care orders. We will see patient for re-evaluation in 1 week(s) here in the clinic. If anything worsens or changes patient will contact our office for additional recommendations. Electronic Signature(s) Signed: 03/28/2017 12:10:29 AM By: [name] PA-C Entered By: [name] on 03/27/2017 11:19:58 [name], [name] ([id number], ARAGONES  ([id number]) -------------------------------------------------------------------------------- ROS/PFSH Details Patient Name: [name] Date of Service: 03/27/2017 10:15 AM Medical Record Number: [id number] Patient Account Number: [id number] Date of Birth/Sex: 1965-10-17 (52 y.o. M) Treating RN: Ahmed Prima Primary Care Provider: [name] Other Clinician: Referring Provider: [name] Treating Provider/Extender: Melburn Hake, HOYT Weeks in Treatment: 1 Information Obtained From Patient Wound History Do you currently have one or more open woundso Yes How many open wounds do you currently haveo 1 Approximately how long have you had your woundso 3 days How have you been treating your wound(s) until nowo neosporin and bandaid Has your wound(s) ever healed and then re-openedo No Have you had any lab work done in the past montho No Have you tested positive for an antibiotic resistant organism (MRSA, VRE)o Yes Have you tested positive for osteomyelitis (bone infection)o Yes Have you had any tests for circulation on your legso No Constitutional Symptoms (General Health) Complaints and Symptoms: Negative for: Fever; Chills Eyes Medical History: Negative for: Cataracts; Glaucoma; Optic Neuritis Ear/Nose/Mouth/Throat Medical History: Negative for: Chronic sinus problems/congestion; Middle ear problems Hematologic/Lymphatic Medical History: Negative for: Anemia; Hemophilia; Human Immunodeficiency Virus; Lymphedema; Sickle Cell Disease Respiratory Complaints and Symptoms: No Complaints or Symptoms Medical History: Negative for: Aspiration; Asthma; Chronic Obstructive Pulmonary Disease (COPD); Pneumothorax; Sleep Apnea; Tuberculosis Cardiovascular Complaints and Symptoms: No Complaints or Symptoms Medical History: Positive for: Hypertension Negative for: Angina; Arrhythmia; Congestive Heart Failure; Coronary Artery Disease; Myocardial Infarction;  Peripheral [name], [name] ([id number]) Arterial Disease; Peripheral Venous Disease; Phlebitis; Vasculitis Gastrointestinal Medical History: Negative for: Cirrhosis ; Colitis; Crohnos; Hepatitis A; Hepatitis B; Hepatitis C Endocrine Medical History: Positive for: Type II Diabetes Time with diabetes: 13 years Treated with: Insulin, Oral agents Blood sugar tested every day: No Blood sugar testing results: Breakfast: 141 Genitourinary Medical History: Negative for: End Stage Renal Disease Immunological Medical History: Negative for: Lupus Erythematosus; Raynaudos; Scleroderma Integumentary (Skin) Medical History: Negative for: History of Burn; History of pressure wounds Musculoskeletal Medical History: Negative for: Gout; Rheumatoid Arthritis; Osteoarthritis; Osteomyelitis Neurologic Medical History: Positive for: Neuropathy Oncologic Medical History: Negative for: Received Chemotherapy; Received Radiation Psychiatric Complaints and Symptoms: No Complaints or Symptoms Medical History: Negative for: Anorexia/bulimia; Confinement Anxiety Immunizations Pneumococcal Vaccine: Received Pneumococcal Vaccination: No [name], [name] ([id number]) Implantable Devices Family and Social History Cancer: Yes - Father; Diabetes: Yes - Father; Heart Disease: No; Hereditary Spherocytosis: No; Hypertension: Yes - Father; Kidney Disease: No; Lung Disease: No; Seizures: No; Stroke: No; Thyroid Problems: No; Tuberculosis: No; Never smoker; Marital Status - Married; Alcohol Use: Never; Drug Use: No History; Caffeine Use: Daily; Financial Concerns: No; Food, Clothing or Shelter Needs: No; Support System Lacking: No; Transportation Concerns: No; Advanced Directives: No; Patient does not want information on Advanced Directives; Living Will: No Physician Affirmation I have reviewed and agree with the above information. Electronic Signature(s) Signed: 03/28/2017 12:10:29 AM By: [name] PA-C Signed: 03/28/2017 4:37:47 PM By: Alric Quan Entered By: [name] on 03/27/2017 11:18:26 [name] ([id number]) -------------------------------------------------------------------------------- SuperBill Details Patient Name: [name] Date of  Service: 03/27/2017 Medical Record Number: [id number] Patient Account Number: [id number] Date of Birth/Sex: 1965/02/26 (52 y.o. M) Treating RN: Ahmed Prima Primary Care Provider: [name] Other Clinician: Referring Provider: [name] Treating Provider/Extender: Melburn Hake, HOYT Weeks in Treatment: 1 Diagnosis Coding ICD-10 Codes Code Description E11.621 Type 2 diabetes mellitus with foot ulcer L97.512 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d L84 Corns and callosities Facility Procedures CPT4 Code: [id number] Description: 99213 - WOUND CARE VISIT-LEV 3 EST PT Modifier: Quantity: 1 Physician Procedures CPT4 Code: [id number] Description: 17001 - WC PHYS LEVEL 3 - EST PT ICD-10 Diagnosis Description E11.621 Type 2 diabetes mellitus with foot ulcer L97.512 Non-pressure chronic ulcer of other part of right foot with fat L84 Corns and callosities Modifier: layer exposed Quantity: 1 Electronic Signature(s) Signed: 03/27/2017 4:51:07 PM By: Alric Quan Signed: 03/28/2017 12:10:29 AM By: [name] PA-C Entered By: Alric Quan on 03/27/2017 16:51:07

## 2017-03-29 NOTE — Progress Notes (Signed)
[name] ([id number]) Visit Report for 03/27/2017 Arrival Information Details Patient Name: [name], [name]. Date of Service: 03/27/2017 10:15 AM Medical Record Number: [id number] Patient Account Number: [id number] Date of Birth/Sex: 12-23-1965 (52 y.o. M) Treating RN: Montey Hora Primary Care Suzzanne Brunkhorst: [name] Other Clinician: Referring Ricki Vanhandel: [name] Treating Alysiana Ethridge/Extender: Melburn Hake, HOYT Weeks in Treatment: 1 Visit Information History Since Last Visit Added or deleted any medications: No Patient Arrived: Ambulatory Any new allergies or adverse reactions: No Arrival Time: 10:02 Had a fall or experienced change in No Accompanied By: self activities of daily living that may affect Transfer Assistance: None risk of falls: Patient Identification Verified: Yes Signs or symptoms of abuse/neglect since last visito No Secondary Verification Process Completed: Yes Hospitalized since last visit: No Patient Has Alerts: Yes Implantable device outside of the clinic excluding No Patient Alerts: DM II cellular tissue based products placed in the center since last visit: Has Dressing in Place as Prescribed: Yes Pain Present Now: No Electronic Signature(s) Signed: 03/27/2017 4:44:49 PM By: Montey Hora Entered By: Montey Hora on 03/27/2017 10:02:33 [name] ([id number]) -------------------------------------------------------------------------------- Clinic Level of Care Assessment Details Patient Name: [name] Date of Service: 03/27/2017 10:15 AM Medical Record Number: [id number] Patient Account Number: [id number] Date of Birth/Sex: [birth year] (52 y.o. M) Treating RN: Ahmed Prima Primary Care Taresa [place]: [name] Other Clinician: Referring Narely Nobles: [name] Treating Keanon Bevins/Extender: Melburn Hake, HOYT Weeks in Treatment: 1 Clinic Level of Care Assessment Items TOOL 4 Quantity Score X - Use when  only an EandM is performed on FOLLOW-UP visit 1 0 ASSESSMENTS - Nursing Assessment / Reassessment X - Reassessment of Co-morbidities (includes updates in patient status) 1 10 X- 1 5 Reassessment of Adherence to Treatment Plan ASSESSMENTS - Wound and Skin Assessment / Reassessment X - Simple Wound Assessment / Reassessment - one wound 1 5 []  - 0 Complex Wound Assessment / Reassessment - multiple wounds []  - 0 Dermatologic / Skin Assessment (not related to wound area) ASSESSMENTS - Focused Assessment []  - Circumferential Edema Measurements - multi extremities 0 []  - 0 Nutritional Assessment / Counseling / Intervention []  - 0 Lower Extremity Assessment (monofilament, tuning fork, pulses) []  - 0 Peripheral Arterial Disease Assessment (using hand held doppler) ASSESSMENTS - Ostomy and/or Continence Assessment and Care []  - Incontinence Assessment and Management 0 []  - 0 Ostomy Care Assessment and Management (repouching, etc.) PROCESS - Coordination of Care X - Simple Patient / Family Education for ongoing care 1 15 []  - 0 Complex (extensive) Patient / Family Education for ongoing care []  - 0 Staff obtains Programmer, systems, Records, Test Results / Process Orders []  - 0 Staff telephones HHA, Nursing Homes / Clarify orders / etc []  - 0 Routine Transfer to another Facility (non-emergent condition) []  - 0 Routine Hospital Admission (non-emergent condition) []  - 0 New Admissions / Biomedical engineer / Ordering NPWT, Apligraf, etc. []  - 0 Emergency Hospital Admission (emergent condition) X- 1 10 Simple Discharge Coordination JAKYLAN, RON ([id number]) []  - 0 Complex (extensive) Discharge Coordination PROCESS - Special Needs []  - Pediatric / Minor Patient Management 0 []  - 0 Isolation Patient Management []  - 0 Hearing / Language / Visual special needs []  - 0 Assessment of Community assistance (transportation, D/C planning, etc.) []  - 0 Additional assistance / Altered  mentation []  - 0 Support Surface(s) Assessment (bed, cushion, seat, etc.) INTERVENTIONS - Wound Cleansing / Measurement X - Simple Wound Cleansing - one wound 1 5 []  - 0 Complex Wound  Cleansing - multiple wounds X- 1 5 Wound Imaging (photographs - any number of wounds) []  - 0 Wound Tracing (instead of photographs) X- 1 5 Simple Wound Measurement - one wound []  - 0 Complex Wound Measurement - multiple wounds INTERVENTIONS - Wound Dressings X - Small Wound Dressing one or multiple wounds 1 10 []  - 0 Medium Wound Dressing one or multiple wounds []  - 0 Large Wound Dressing one or multiple wounds X- 1 5 Application of Medications - topical []  - 0 Application of Medications - injection INTERVENTIONS - Miscellaneous []  - External ear exam 0 []  - 0 Specimen Collection (cultures, biopsies, blood, body fluids, etc.) []  - 0 Specimen(s) / Culture(s) sent or taken to Lab for analysis []  - 0 Patient Transfer (multiple staff / Civil Service fast streamer / Similar devices) []  - 0 Simple Staple / Suture removal (25 or less) []  - 0 Complex Staple / Suture removal (26 or more) []  - 0 Hypo / Hyperglycemic Management (close monitor of Blood Glucose) []  - 0 Ankle / Brachial Index (ABI) - do not check if billed separately X- 1 5 Vital Signs BRAELIN, COSTLOW. ([id number]) Has the patient been seen at the hospital within the last three years: Yes Total Score: 80 Level Of Care: New/Established - Level 3 Electronic Signature(s) Signed: 03/28/2017 4:37:47 PM By: Alric Quan Entered By: Alric Quan on 03/27/2017 16:51:00 [name] ([id number]) -------------------------------------------------------------------------------- Encounter Discharge Information Details Patient Name: [name] Date of Service: 03/27/2017 10:15 AM Medical Record Number: [id number] Patient Account Number: [id number] Date of Birth/Sex: 10/15/1965 (52 y.o. M) Treating RN: Ahmed Prima Primary Care  Bentlie Withem: [name] Other Clinician: Referring Jayvyn Haselton: [name] Treating Lahela Woodin/Extender: Melburn Hake, HOYT Weeks in Treatment: 1 Encounter Discharge Information Items Discharge Pain Level: 0 Discharge Condition: Stable Ambulatory Status: Ambulatory Discharge Destination: Home Transportation: Private Auto Schedule Follow-up Appointment: Yes Medication Reconciliation completed and No provided to Patient/Care Torsten Weniger: Provided on Clinical Summary of Care: 03/27/2017 Form Type Recipient Paper Patient DP Electronic Signature(s) Signed: 03/28/2017 7:46:43 AM By: Roger Shelter Entered By: Roger Shelter on 03/27/2017 10:35:49 [name] ([id number]) -------------------------------------------------------------------------------- Lower Extremity Assessment Details Patient Name: [name] Date of Service: 03/27/2017 10:15 AM Medical Record Number: [id number] Patient Account Number: [id number] Date of Birth/Sex: 1965-08-10 (52 y.o. M) Treating RN: Montey Hora Primary Care Ostin Mathey: [name] Other Clinician: Referring Jaquesha Boroff: [name] Treating Johonna Binette/Extender: Melburn Hake, HOYT Weeks in Treatment: 1 Vascular Assessment Pulses: Dorsalis Pedis Palpable: [Right:Yes] Posterior Tibial Extremity colors, hair growth, and conditions: Extremity Color: [Right:Normal] Hair Growth on Extremity: [Right:Yes] Temperature of Extremity: [Right:Warm] Capillary Refill: [Right:< 3 seconds] Toe Nail Assessment Left: Right: Thick: Yes Discolored: No Deformed: No Improper Length and Hygiene: No Electronic Signature(s) Signed: 03/27/2017 4:44:49 PM By: Montey Hora Entered By: Montey Hora on 03/27/2017 10:08:15 [name] ([id number]) -------------------------------------------------------------------------------- Multi Wound Chart Details Patient Name: [name] Date of Service: 03/27/2017 10:15 AM Medical  Record Number: [id number] Patient Account Number: [id number] Date of Birth/Sex: [birth year] (52 y.o. M) Treating RN: Ahmed Prima Primary Care Jariah Tarkowski: [name] Other Clinician: Referring Aryiana Klinkner: [name] Treating [place] Silveria/Extender: STONE III, HOYT Weeks in Treatment: 1 Vital Signs Height(in): 74 Pulse(bpm): 72 Weight(lbs): 300 Blood Pressure(mmHg): 142/71 Body Mass Index(BMI): 39 Temperature(F): 98.2 Respiratory Rate 18 (breaths/min): Photos: [3:No Photos] [N/A:N/A] Wound Location: [3:Right Toe Great] [N/A:N/A] Wounding Event: [3:Trauma] [N/A:N/A] Primary Etiology: [3:Diabetic Wound/Ulcer of the Lower Extremity] [N/A:N/A] Comorbid History: [3:Hypertension, Type II Diabetes, Neuropathy] [N/A:N/A] Date Acquired: [3:03/13/2017] [N/A:N/A] Weeks of Treatment: [  3:1] [N/A:N/A] Wound Status: [3:Open] [N/A:N/A] Pending Amputation on [3:Yes] [N/A:N/A] Presentation: Measurements L x W x D [3:0.1x0.3x0.1] [N/A:N/A] (cm) Area (cm) : [3:0.024] [N/A:N/A] Volume (cm) : [3:0.002] [N/A:N/A] % Reduction in Area: [3:93.90%] [N/A:N/A] % Reduction in Volume: [3:94.90%] [N/A:N/A] Classification: [3:Grade 1] [N/A:N/A] Exudate Amount: [3:Medium] [N/A:N/A] Exudate Type: [3:Serous] [N/A:N/A] Exudate Color: [3:amber] [N/A:N/A] Wound Margin: [3:Flat and Intact] [N/A:N/A] Granulation Amount: [3:Large (67-100%)] [N/A:N/A] Granulation Quality: [3:Pink] [N/A:N/A] Necrotic Amount: [3:None Present (0%)] [N/A:N/A] Exposed Structures: [3:Fat Layer (Subcutaneous Tissue) Exposed: Yes Fascia: No Tendon: No Muscle: No Joint: No Bone: No] [N/A:N/A] Epithelialization: [3:Small (1-33%)] [N/A:N/A] Periwound Skin Texture: [3:Callus: Yes Excoriation: No Induration: No] [N/A:N/A] Crepitus: No Rash: No Scarring: No Periwound Skin Moisture: Maceration: No N/A N/A Dry/Scaly: No Periwound Skin Color: Erythema: Yes N/A N/A Atrophie Blanche: No Cyanosis: No Ecchymosis: No Hemosiderin  Staining: No Mottled: No Pallor: No Rubor: No Erythema Location: Circumferential N/A N/A Temperature: No Abnormality N/A N/A Tenderness on Palpation: No N/A N/A Wound Preparation: Ulcer Cleansing: N/A N/A Rinsed/Irrigated with Saline Topical Anesthetic Applied: Other: lidocaine 4% Treatment Notes Electronic Signature(s) Signed: 03/28/2017 4:37:47 PM By: Alric Quan Entered By: Alric Quan on 03/27/2017 10:26:43 [name] ([id number]) -------------------------------------------------------------------------------- [place] Details Patient Name: DOVER, HEAD. Date of Service: 03/27/2017 10:15 AM Medical Record Number: [id number] Patient Account Number: [id number] Date of Birth/Sex: [birth year] (52 y.o. M) Treating RN: Ahmed Prima Primary Care Jeri Jeanbaptiste: [name] Other Clinician: Referring Ivaan Liddy: [name] Treating Cire Deyarmin/Extender: Melburn Hake, HOYT Weeks in Treatment: 1 Active Inactive ` Nutrition Nursing Diagnoses: Imbalanced nutrition Impaired glucose control: actual or potential Potential for alteratiion in Nutrition/Potential for imbalanced nutrition Goals: Patient/caregiver verbalizes understanding of need to maintain therapeutic glucose control per primary care physician Date Initiated: 03/15/2017 Target Resolution Date: 06/09/2017 Goal Status: Active Interventions: Assess patient nutrition upon admission and as needed per policy Provide education on elevated blood sugars and impact on wound healing Notes: ` Orientation to the Wound Care Program Nursing Diagnoses: Knowledge deficit related to the wound healing center program Goals: Patient/caregiver will verbalize understanding of the [place] Date Initiated: 03/15/2017 Target Resolution Date: 04/07/2017 Goal Status: Active Interventions: Provide education on orientation to the wound center Notes: ` Pain, Acute or  Chronic Nursing Diagnoses: Pain, acute or chronic: actual or potential Potential alteration in comfort, pain Goals: Patient/caregiver will verbalize adequate pain control between visits CHESLEY, VEASEY ([id number]) Date Initiated: 03/15/2017 Target Resolution Date: 07/07/2017 Goal Status: Active Interventions: Complete pain assessment as per visit requirements Notes: ` Wound/Skin Impairment Nursing Diagnoses: Impaired tissue integrity Knowledge deficit related to ulceration/compromised skin integrity Goals: Ulcer/skin breakdown will have a volume reduction of 80% by week 12 Date Initiated: 03/15/2017 Target Resolution Date: 06/02/2017 Goal Status: Active Interventions: Assess patient/caregiver ability to perform ulcer/skin care regimen upon admission and as needed Assess ulceration(s) every visit Notes: Electronic Signature(s) Signed: 03/28/2017 4:37:47 PM By: Alric Quan Entered By: Alric Quan on 03/27/2017 10:26:27 [name] ([id number]) -------------------------------------------------------------------------------- Pain Assessment Details Patient Name: [name] Date of Service: 03/27/2017 10:15 AM Medical Record Number: [id number] Patient Account Number: [id number] Date of Birth/Sex: 12-Jun-1965 (52 y.o. M) Treating RN: Montey Hora Primary Care Alazia Crocket: [name] Other Clinician: Referring Kayvion Arneson: [name] Treating Deanthony Maull/Extender: Melburn Hake, HOYT Weeks in Treatment: 1 Active Problems Location of Pain Severity and Description of Pain Patient Has Paino No Site Locations Pain Management and Medication Current Pain Management: Electronic Signature(s) Signed: 03/27/2017 4:44:49 PM By: Montey Hora Entered By: Montey Hora on 03/27/2017  10:02:44 LUCUS, LAMBERTSON ([id number]) -------------------------------------------------------------------------------- Patient/Caregiver Education Details Patient Name:  JAIRUS, TONNE. Date of Service: 03/27/2017 10:15 AM Medical Record Number: [id number] Patient Account Number: [id number] Date of Birth/Gender: 11-16-1965 (52 y.o. M) Treating RN: Roger Shelter Primary Care Physician: [name] Other Clinician: Referring Physician: [name] Treating Physician/Extender: Sharalyn Ink in Treatment: 1 Education Assessment Education Provided To: Patient Education Topics Provided Wound Debridement: Handouts: Wound Debridement Methods: Explain/Verbal Responses: State content correctly Wound/Skin Impairment: Handouts: Caring for Your Ulcer Methods: Explain/Verbal Responses: State content correctly Electronic Signature(s) Signed: 03/28/2017 7:46:43 AM By: Roger Shelter Entered By: Roger Shelter on 03/27/2017 10:36:05 [name] ([id number]) -------------------------------------------------------------------------------- Wound Assessment Details Patient Name: [name] Date of Service: 03/27/2017 10:15 AM Medical Record Number: [id number] Patient Account Number: [id number] Date of Birth/Sex: [birth year] (52 y.o. M) Treating RN: Montey Hora Primary Care Tay Whitwell: [name] Other Clinician: Referring Delaila Nand: [name] Treating Brandilee Pies/Extender: STONE III, HOYT Weeks in Treatment: 1 Wound Status Wound Number: 3 Primary Etiology: Diabetic Wound/Ulcer of the Lower Extremity Wound Location: Right Toe Great Wound Status: Open Wounding Event: Trauma Comorbid Hypertension, Type II Diabetes, Date Acquired: 03/13/2017 History: Neuropathy Weeks Of Treatment: 1 Clustered Wound: No Pending Amputation On Presentation Photos Photo Uploaded By: Montey Hora on 03/27/2017 14:13:33 Wound Measurements Length: (cm) 0.1 Width: (cm) 0.3 Depth: (cm) 0.1 Area: (cm) 0.024 Volume: (cm) 0.002 % Reduction in Area: 93.9% % Reduction in Volume: 94.9% Epithelialization: Small  (1-33%) Tunneling: No Undermining: No Wound Description Classification: Grade 1 Wound Margin: Flat and Intact Exudate Amount: Medium Exudate Type: Serous Exudate Color: amber Foul Odor After Cleansing: No Slough/Fibrino No Wound Bed Granulation Amount: Large (67-100%) Exposed Structure Granulation Quality: Pink Fascia Exposed: No Necrotic Amount: None Present (0%) Fat Layer (Subcutaneous Tissue) Exposed: Yes Tendon Exposed: No Muscle Exposed: No Joint Exposed: No Bone Exposed: No Periwound Skin Texture JAESEAN, LITZAU. ([id number]) Texture Color No Abnormalities Noted: No No Abnormalities Noted: No Callus: Yes Atrophie Blanche: No Crepitus: No Cyanosis: No Excoriation: No Ecchymosis: No Induration: No Erythema: Yes Rash: No Erythema Location: Circumferential Scarring: No Hemosiderin Staining: No Mottled: No Moisture Pallor: No No Abnormalities Noted: No Rubor: No Dry / Scaly: No Maceration: No Temperature / Pain Temperature: No Abnormality Wound Preparation Ulcer Cleansing: Rinsed/Irrigated with Saline Topical Anesthetic Applied: Other: lidocaine 4%, Treatment Notes Wound #3 (Right Toe Great) 1. Cleansed with: Clean wound with Normal Saline 2. Anesthetic Topical Lidocaine 4% cream to wound bed prior to debridement 4. Dressing Applied: Prisma Ag 5. Secondary Dressing Applied Dry Gauze Foam Kerlix/Conform Notes tape Electronic Signature(s) Signed: 03/27/2017 4:44:49 PM By: Montey Hora Entered By: Montey Hora on 03/27/2017 10:07:54 [name] ([id number]) -------------------------------------------------------------------------------- Vitals Details Patient Name: [name] Date of Service: 03/27/2017 10:15 AM Medical Record Number: [id number] Patient Account Number: [id number] Date of Birth/Sex: 10/06/1965 (52 y.o. M) Treating RN: Montey Hora Primary Care Jaidan Prevette: [name] Other Clinician: Referring  Simi Briel: [name] Treating Sameena Artus/Extender: Melburn Hake, HOYT Weeks in Treatment: 1 Vital Signs Time Taken: 10:02 Temperature (F): 98.2 Height (in): 74 Pulse (bpm): 72 Weight (lbs): 300 Respiratory Rate (breaths/min): 18 Body Mass Index (BMI): 38.5 Blood Pressure (mmHg): 142/71 Reference Range: 80 - 120 mg / dl Electronic Signature(s) Signed: 03/27/2017 4:44:49 PM By: Montey Hora Entered By: Montey Hora on 03/27/2017 10:05:26

## 2017-04-03 ENCOUNTER — Ambulatory Visit: Payer: Managed Care, Other (non HMO) | Admitting: Physician Assistant

## 2017-04-23 IMAGING — CR DG FOOT COMPLETE 3+V*R*
3 series · 3 of 3 positions shown · non-contrast
Comparison: 03/04/2015

CLINICAL DATA: Status post fifth metatarsal amputation

EXAM:
RIGHT FOOT COMPLETE - 3+ VIEW

[AP]
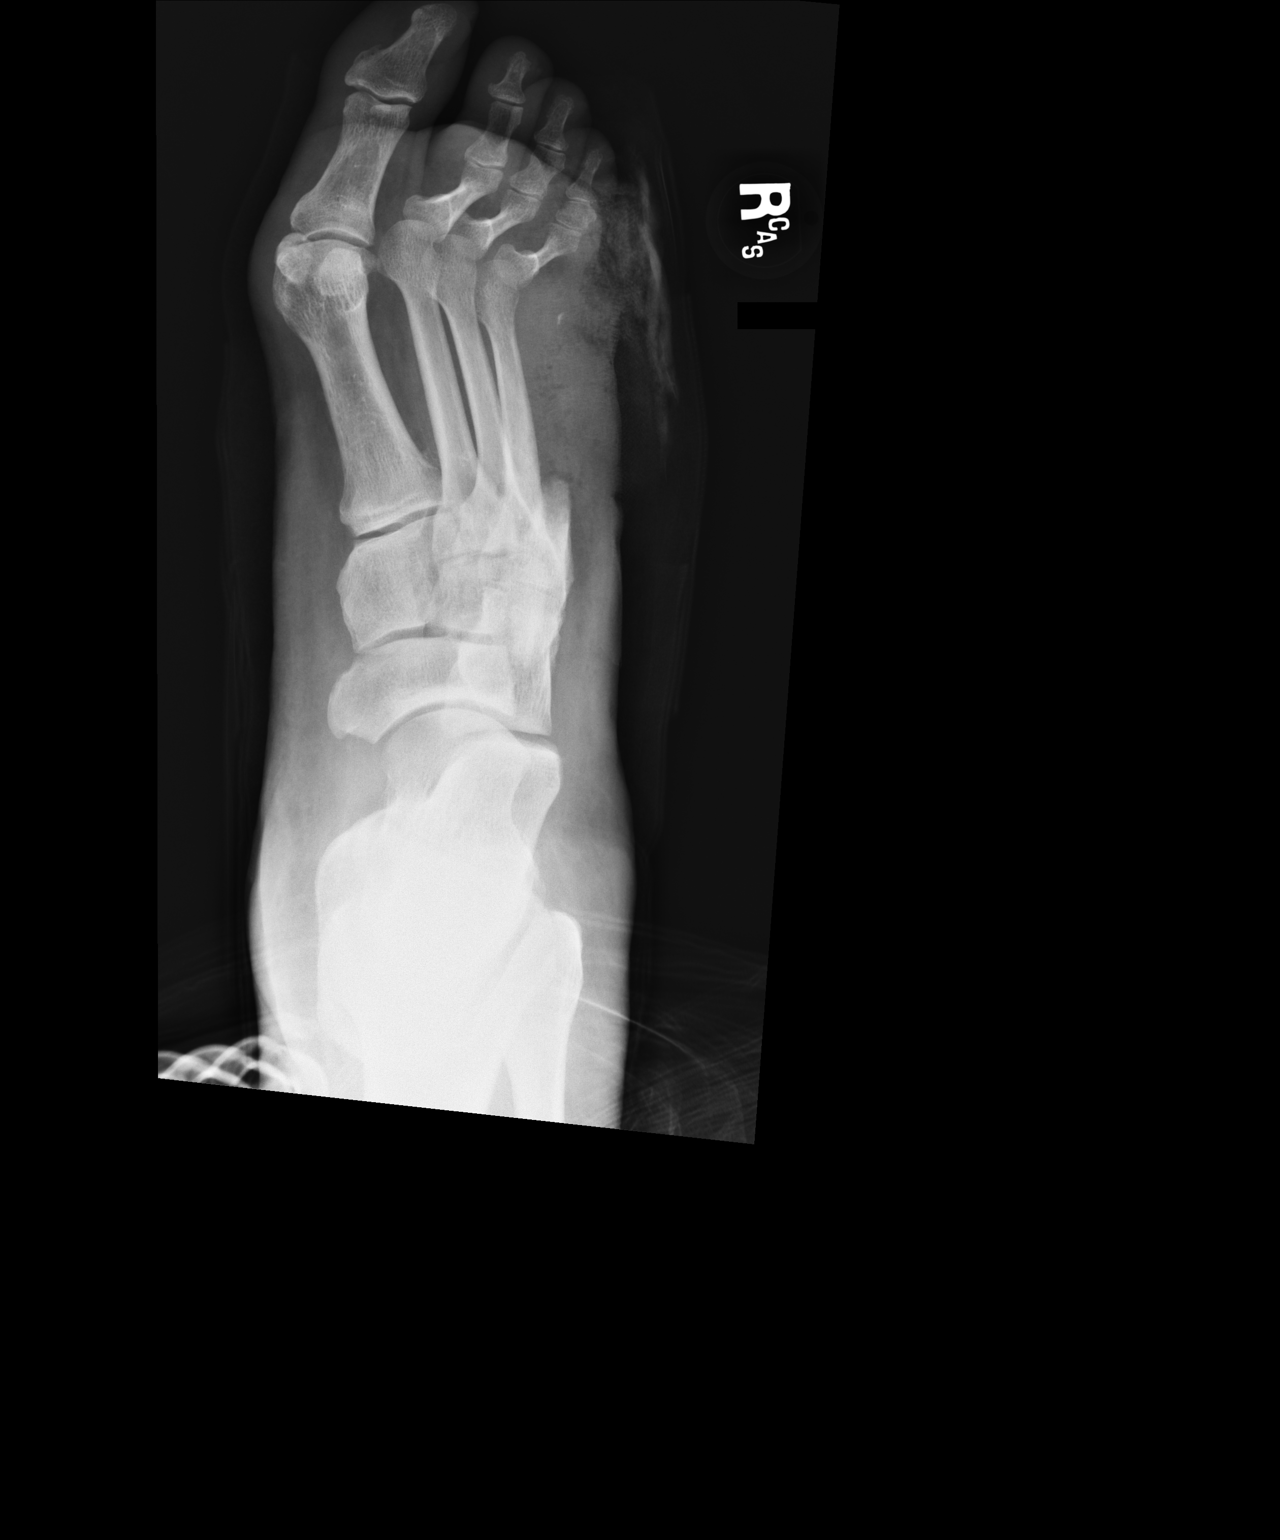

[ap obl int rot]
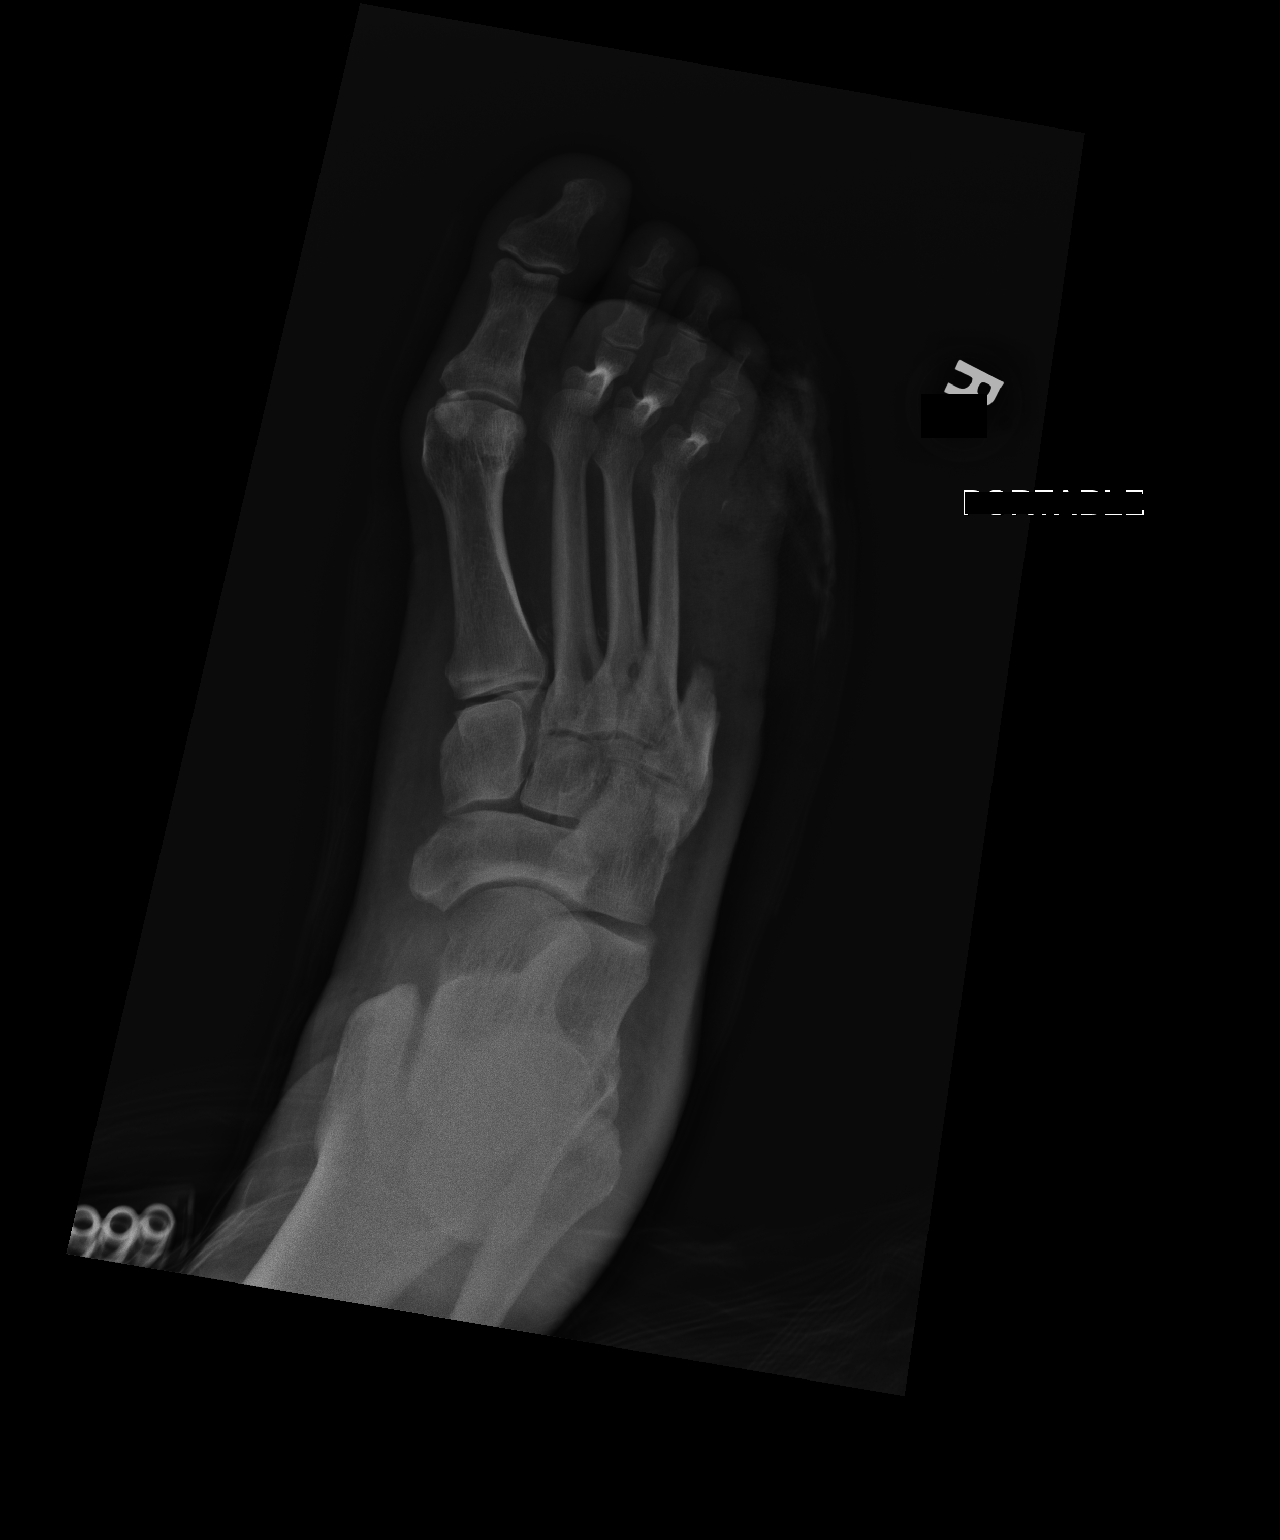

[lateral]
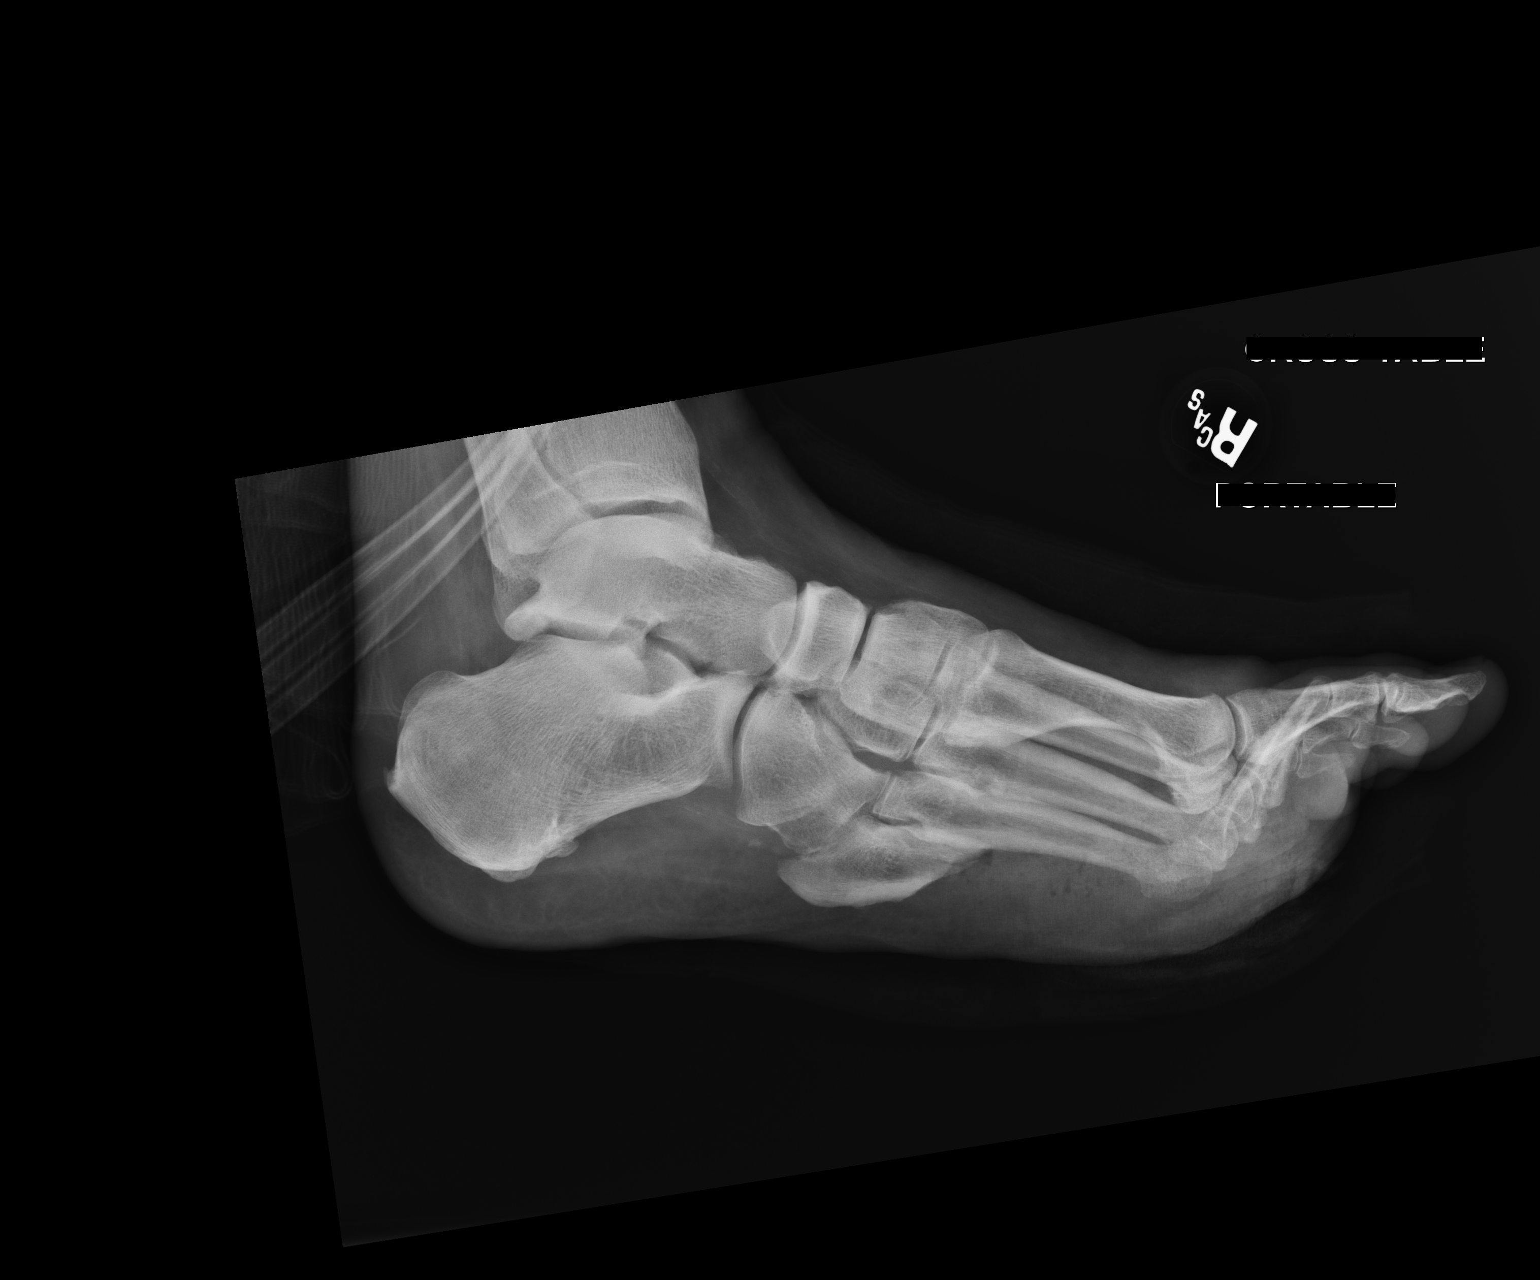

[3 of 3 positions shown; findings below may reference images not displayed]

FINDINGS: There is been interval resection of the majority of the fifth
metatarsal as well as the fifth toe. Some air is noted in the
surgical bed. No other focal area of bony destruction is noted to
suggest osteomyelitis.
IMPRESSION: Status post fifth metatarsal and fifth toe amputation.

## 2018-04-23 DIAGNOSIS — G629 Polyneuropathy, unspecified: Secondary | ICD-10-CM | POA: Insufficient documentation

## 2018-12-02 ENCOUNTER — Encounter: Payer: Self-pay | Admitting: Gastroenterology

## 2018-12-06 ENCOUNTER — Encounter: Payer: Self-pay | Admitting: Gastroenterology

## 2018-12-20 DIAGNOSIS — I10 Essential (primary) hypertension: Secondary | ICD-10-CM | POA: Insufficient documentation

## 2018-12-20 DIAGNOSIS — E119 Type 2 diabetes mellitus without complications: Secondary | ICD-10-CM | POA: Insufficient documentation

## 2019-01-06 ENCOUNTER — Ambulatory Visit (AMBULATORY_SURGERY_CENTER): Payer: Managed Care, Other (non HMO) | Admitting: *Deleted

## 2019-01-06 ENCOUNTER — Other Ambulatory Visit: Payer: Self-pay

## 2019-01-06 VITALS — Ht 74.0 in | Wt 310.0 lb

## 2019-01-06 DIAGNOSIS — Z800 Family history of malignant neoplasm of digestive organs: Secondary | ICD-10-CM

## 2019-01-06 DIAGNOSIS — Z1159 Encounter for screening for other viral diseases: Secondary | ICD-10-CM

## 2019-01-06 DIAGNOSIS — Z86010 Personal history of colonic polyps: Secondary | ICD-10-CM

## 2019-01-06 MED ORDER — SUPREP BOWEL PREP KIT 17.5-3.13-1.6 GM/177ML PO SOLN: 1.0000 | Freq: Once | ORAL | 0 refills | Status: AC

## 2019-01-06 NOTE — Progress Notes (Signed)
Patient denies any allergies to egg or soy products. Patient denies complications with anesthesia/sedation.  Patient denies oxygen use at home and denies diet medications. Emmi instructions for colonoscopy/endoscopy explained and given to patient.  Suprep coupon given.   

## 2019-01-07 ENCOUNTER — Encounter: Payer: Self-pay | Admitting: Gastroenterology

## 2019-01-14 ENCOUNTER — Other Ambulatory Visit: Payer: Self-pay | Admitting: Gastroenterology

## 2019-01-14 ENCOUNTER — Ambulatory Visit (INDEPENDENT_AMBULATORY_CARE_PROVIDER_SITE_OTHER): Payer: Managed Care, Other (non HMO)

## 2019-01-14 DIAGNOSIS — Z1159 Encounter for screening for other viral diseases: Secondary | ICD-10-CM

## 2019-01-14 LAB — SARS CORONAVIRUS 2 (TAT 6-24 HRS): SARS Coronavirus 2: NEGATIVE

## 2019-01-17 ENCOUNTER — Ambulatory Visit (AMBULATORY_SURGERY_CENTER): Payer: Managed Care, Other (non HMO) | Admitting: Gastroenterology

## 2019-01-17 ENCOUNTER — Encounter: Payer: Self-pay | Admitting: Gastroenterology

## 2019-01-17 ENCOUNTER — Other Ambulatory Visit: Payer: Self-pay

## 2019-01-17 VITALS — BP 134/71 | HR 73 | Temp 97.9°F | Resp 28 | Ht 74.0 in | Wt 310.0 lb

## 2019-01-17 DIAGNOSIS — Z800 Family history of malignant neoplasm of digestive organs: Secondary | ICD-10-CM | POA: Diagnosis not present

## 2019-01-17 DIAGNOSIS — D123 Benign neoplasm of transverse colon: Secondary | ICD-10-CM

## 2019-01-17 DIAGNOSIS — Z86010 Personal history of colonic polyps: Secondary | ICD-10-CM | POA: Diagnosis not present

## 2019-01-17 DIAGNOSIS — D125 Benign neoplasm of sigmoid colon: Secondary | ICD-10-CM

## 2019-01-17 DIAGNOSIS — D124 Benign neoplasm of descending colon: Secondary | ICD-10-CM

## 2019-01-17 DIAGNOSIS — D122 Benign neoplasm of ascending colon: Secondary | ICD-10-CM

## 2019-01-17 MED ORDER — SODIUM CHLORIDE 0.9 % IV SOLN: 500.0000 mL | Freq: Once | INTRAVENOUS | Status: DC

## 2019-01-17 NOTE — Progress Notes (Signed)
Called to room to assist during endoscopic procedure.  Patient ID and intended procedure confirmed with present staff. Received instructions for my participation in the procedure from the performing physician.  

## 2019-01-17 NOTE — Patient Instructions (Signed)
  12 polyps removed today Consider genetic testing-3rd floor can help you set this up    [place]:   Refer to the procedure report that was given to you for any specific questions about what was found during the examination.  If the procedure report does not answer your questions, please call your gastroenterologist to clarify.  If you requested that your care partner not be given the details of your procedure findings, then the procedure report has been included in a sealed envelope for you to review at your convenience later.  YOU SHOULD EXPECT: Some feelings of bloating in the abdomen. Passage of more gas than usual.  Walking can help get rid of the air that was put into your GI tract during the procedure and reduce the bloating. If you had a lower endoscopy (such as a colonoscopy or flexible sigmoidoscopy) you may notice spotting of blood in your stool or on the toilet paper. If you underwent a bowel prep for your procedure, you may not have a normal bowel movement for a few days.  Please Note:  You might notice some irritation and congestion in your nose or some drainage.  This is from the oxygen used during your procedure.  There is no need for concern and it should clear up in a day or so.  SYMPTOMS TO REPORT IMMEDIATELY:   Following lower endoscopy (colonoscopy or flexible sigmoidoscopy):  Excessive amounts of blood in the stool  Significant tenderness or worsening of abdominal pains  Swelling of the abdomen that is new, acute  Fever of 100F or higher   For urgent or emergent issues, a gastroenterologist can be reached at any hour by calling [phone].   DIET:  We do recommend a small meal at first, but then you may proceed to your regular diet.  Drink plenty of fluids but you should avoid alcoholic beverages for 24 hours.  ACTIVITY:  You should plan to take it easy for the rest of today and you should NOT DRIVE or  use heavy machinery until tomorrow (because of the sedation medicines used during the test).    FOLLOW UP: Our staff will call the number listed on your records 48-72 hours following your procedure to check on you and address any questions or concerns that you may have regarding the information given to you following your procedure. If we do not reach you, we will leave a message.  We will attempt to reach you two times.  During this call, we will ask if you have developed any symptoms of COVID 19. If you develop any symptoms (ie: fever, flu-like symptoms, shortness of breath, cough etc.) before then, please call [phone].  If you test positive for Covid 19 in the 2 weeks post procedure, please call and report this information to [place].    If any biopsies were taken you will be contacted by phone or by letter within the next 1-3 weeks.  Please call us at [phone] if you have not heard about the biopsies in 3 weeks.    SIGNATURES/CONFIDENTIALITY: You and/or your care partner have signed paperwork which will be entered into your electronic medical record.  These signatures attest to the fact that that the information above on your After Visit Summary has been reviewed and is understood.  Full responsibility of the confidentiality of this discharge information lies with you and/or your care-partner.

## 2019-01-17 NOTE — Progress Notes (Signed)
Pt. Reports no change in his medical or surgical history since his pre-visit 01/06/2019.

## 2019-01-17 NOTE — Progress Notes (Signed)
Report to PACU, RN, vss, BBS= Clear.  

## 2019-01-17 NOTE — Op Note (Addendum)
[name]: [name] Procedure Date: 01/17/2019 11:02 AM MRN: [id number] Endoscopist: Remo Lipps P. Havery Moros , MD Age: 54 Referring MD:  Date of Birth: 02-15-1965 Gender: Male Account #: [id number] Procedure:                Colonoscopy Indications:              Surveillance: Personal history of numerous                            adenomatous polyps on last colonoscopy 3 years ago                            (largest 6mm), father with colon cancer age 10 Medicines:                Monitored Anesthesia Care Procedure:                Pre-Anesthesia Assessment:                           - Prior to the procedure, a History and Physical                            was performed, and patient medications and                            allergies were reviewed. The patient's tolerance of                            previous anesthesia was also reviewed. The risks                            and benefits of the procedure and the sedation                            options and risks were discussed with the patient.                            All questions were answered, and informed consent                            was obtained. Prior Anticoagulants: The patient has                            taken no previous anticoagulant or antiplatelet                            agents. ASA Grade Assessment: II - A patient with                            mild systemic disease. After reviewing the risks                            and benefits, the patient was deemed in  satisfactory condition to undergo the procedure.                           After obtaining informed consent, the colonoscope                            was passed under direct vision. Throughout the                            procedure, the patient's blood pressure, pulse, and                            oxygen saturations were monitored continuously. The   Colonoscope was introduced through the anus and                            advanced to the the cecum, identified by                            appendiceal orifice and ileocecal valve. The                            colonoscopy was performed without difficulty. The                            patient tolerated the procedure well. The quality                            of the bowel preparation was good. The ileocecal                            valve, appendiceal orifice, and rectum were                            photographed. Scope In: 11:10:03 AM Scope Out: 11:48:46 AM Scope Withdrawal Time: 0 hours 30 minutes 42 seconds  Total Procedure Duration: 0 hours 38 minutes 43 seconds  Findings:                 The perianal and digital rectal examinations were                            normal.                           A 10 mm polyp was found in the ascending colon. The                            polyp was sessile, and located directly across from                            the IC valve. The polyp was grasped with soft snare                            in entirety and attempted to remove  cold initially                            but would not cut through. A burst of Endocut                            setting was used to resect it. Resection and                            retrieval were complete.                           A tattoo was seen in the ascending colon. A                            post-polypectomy scar was found at the tattoo site                            and looked healthy without any residual polyp.                           Two sessile polyps were found in the ascending                            colon. The polyps were 3 mm in size. These polyps                            were removed with a cold snare. Resection and                            retrieval were complete.                           Two sessile polyps were found in the hepatic                            flexure. The  polyps were 3 to 5 mm in size. These                            polyps were removed with a cold snare. Resection                            and retrieval were complete.                           Four sessile polyps were found in the transverse                            colon. The polyps were 3 to 4 mm in size. These                            polyps were removed with a cold snare. Resection  and retrieval were complete.                           Two sessile polyps were found in the descending                            colon. The polyps were 2 to 3 mm in size. These                            polyps were removed with a cold snare. Resection                            and retrieval were complete.                           A 3 mm polyp was found in the sigmoid colon. The                            polyp was sessile. The polyp was removed with a                            cold snare. Resection and retrieval were complete.                           Anal papilla(e) were hypertrophied.                           The exam was otherwise without abnormality. Complications:            No immediate complications. Estimated blood loss:                            Minimal. Estimated Blood Loss:     Estimated blood loss was minimal. Impression:               - One 10 mm polyp in the ascending colon, removed                            with Endocut setting snare. Resected and retrieved.                           - A tattoo was seen in the ascending colon. A                            post-polypectomy scar was found at the tattoo site.                           - Two 3 mm polyps in the ascending colon, removed                            with a cold snare. Resected and retrieved.                           - Two 3 to 5  mm polyps at the hepatic flexure,                            removed with a cold snare. Resected and retrieved.                           - Four 3 to 4 mm polyps in  the transverse colon,                            removed with a cold snare. Resected and retrieved.                           - Two 2 to 3 mm polyps in the descending colon,                            removed with a cold snare. Resected and retrieved.                           - One 3 mm polyp in the sigmoid colon, removed with                            a cold snare. Resected and retrieved.                           - Anal papilla(e) were hypertrophied.                           - The examination was otherwise normal. Recommendation:           - Patient has a contact number available for                            emergencies. The signs and symptoms of potential                            delayed complications were discussed with the                            patient. Return to normal activities tomorrow.                            Written discharge instructions were provided to the                            patient.                           - Resume previous diet.                           - Continue present medications.                           - Await pathology results.                           -  Consideration for referral to genetics (if not                            already done) given numerous polyps noted on the                            last 2 exams and strong family history of colon                            cancer Remo Lipps P. Tocara Mennen, MD 01/17/2019 12:03:29 PM This report has been signed electronically.

## 2019-01-21 ENCOUNTER — Telehealth: Payer: Self-pay | Admitting: Gastroenterology

## 2019-01-21 ENCOUNTER — Other Ambulatory Visit: Payer: Self-pay

## 2019-01-21 ENCOUNTER — Telehealth: Payer: Self-pay

## 2019-01-21 DIAGNOSIS — Z800 Family history of malignant neoplasm of digestive organs: Secondary | ICD-10-CM

## 2019-01-21 DIAGNOSIS — Z86010 Personal history of colonic polyps: Secondary | ICD-10-CM

## 2019-01-21 NOTE — Telephone Encounter (Signed)
  Follow up Call-  Call back number 01/17/2019  Post procedure Call Back phone  # [phone]  Permission to leave phone message Yes  Some recent data might be hidden     Patient questions:  Do you have a fever, pain , or abdominal swelling? No. Pain Score  0 *  Have you tolerated food without any problems? Yes.    Have you been able to return to your normal activities? Yes.    Do you have any questions about your discharge instructions: Diet   No. Medications  No. Follow up visit  No.  Do you have questions or concerns about your Care? No.  Actions: * If pain score is 4 or above: No action needed, pain <4.  1. Have you developed a fever since your procedure? no  2.   Have you had an respiratory symptoms (SOB or cough) since your procedure? no  3.   Have you tested positive for COVID 19 since your procedure no 4.   Have you had any family members/close contacts diagnosed with the COVID 19 since your procedure?  no  If yes to any of these questions please route to Joylene John, RN and Alphonsa Gin, Therapist, sports.

## 2019-01-21 NOTE — Telephone Encounter (Signed)
Genetic counseling referral made, per Dr. Doyne Keel recommendation and patient request

## 2019-01-23 ENCOUNTER — Telehealth: Payer: Self-pay

## 2019-01-23 NOTE — Telephone Encounter (Signed)
Patient returned your call about results, please call patient one more time.   

## 2019-01-23 NOTE — Telephone Encounter (Signed)
See result note.  

## 2019-01-23 NOTE — Telephone Encounter (Signed)
Left message for patient to please call back. 

## 2019-01-23 NOTE — Telephone Encounter (Signed)
-----   Message from Yetta Flock, MD sent at 01/22/2019  5:27 PM EST ----- Sherlynn Stalls can you relay the following: - 12 pre-cancerous polyps removed on this exam. He had numerous polyps removed on his exam prior to this 3 years ago, and he has a strong FH of colon cancer - recommend repeat colonoscopy in one year - can you also refer him to genetic counseling if he is agreeable to that given these findings. Thanks  Early Chars, repeat colonoscopy in one year.

## 2019-01-24 ENCOUNTER — Encounter (HOSPITAL_COMMUNITY): Payer: Self-pay | Admitting: Emergency Medicine

## 2019-01-24 ENCOUNTER — Inpatient Hospital Stay (HOSPITAL_COMMUNITY)
Admission: EM | Admit: 2019-01-24 | Discharge: 2019-01-26 | DRG: 394 | Disposition: A | Discharge status: Home / Self Care | Payer: Managed Care, Other (non HMO) | Attending: Internal Medicine | Admitting: Internal Medicine

## 2019-01-24 ENCOUNTER — Other Ambulatory Visit: Payer: Self-pay

## 2019-01-24 DIAGNOSIS — K633 Ulcer of intestine: Principal | ICD-10-CM | POA: Diagnosis present

## 2019-01-24 DIAGNOSIS — E119 Type 2 diabetes mellitus without complications: Secondary | ICD-10-CM

## 2019-01-24 DIAGNOSIS — D509 Iron deficiency anemia, unspecified: Secondary | ICD-10-CM | POA: Diagnosis present

## 2019-01-24 DIAGNOSIS — I10 Essential (primary) hypertension: Secondary | ICD-10-CM | POA: Diagnosis present

## 2019-01-24 DIAGNOSIS — Z794 Long term (current) use of insulin: Secondary | ICD-10-CM

## 2019-01-24 DIAGNOSIS — K625 Hemorrhage of anus and rectum: Secondary | ICD-10-CM | POA: Diagnosis present

## 2019-01-24 DIAGNOSIS — Z833 Family history of diabetes mellitus: Secondary | ICD-10-CM

## 2019-01-24 DIAGNOSIS — E1169 Type 2 diabetes mellitus with other specified complication: Secondary | ICD-10-CM | POA: Diagnosis present

## 2019-01-24 DIAGNOSIS — Z8371 Family history of colonic polyps: Secondary | ICD-10-CM

## 2019-01-24 DIAGNOSIS — E785 Hyperlipidemia, unspecified: Secondary | ICD-10-CM | POA: Diagnosis present

## 2019-01-24 DIAGNOSIS — Z9889 Other specified postprocedural states: Secondary | ICD-10-CM

## 2019-01-24 DIAGNOSIS — Z20822 Contact with and (suspected) exposure to covid-19: Secondary | ICD-10-CM | POA: Diagnosis present

## 2019-01-24 DIAGNOSIS — Z79899 Other long term (current) drug therapy: Secondary | ICD-10-CM

## 2019-01-24 DIAGNOSIS — Z800 Family history of malignant neoplasm of digestive organs: Secondary | ICD-10-CM

## 2019-01-24 DIAGNOSIS — D62 Acute posthemorrhagic anemia: Secondary | ICD-10-CM | POA: Diagnosis present

## 2019-01-24 DIAGNOSIS — Z86010 Personal history of colonic polyps: Secondary | ICD-10-CM

## 2019-01-24 DIAGNOSIS — K922 Gastrointestinal hemorrhage, unspecified: Secondary | ICD-10-CM | POA: Diagnosis present

## 2019-01-24 DIAGNOSIS — Z89421 Acquired absence of other right toe(s): Secondary | ICD-10-CM

## 2019-01-24 DIAGNOSIS — Z7982 Long term (current) use of aspirin: Secondary | ICD-10-CM

## 2019-01-24 DIAGNOSIS — Z6839 Body mass index (BMI) 39.0-39.9, adult: Secondary | ICD-10-CM

## 2019-01-24 LAB — CBC WITH DIFFERENTIAL/PLATELET
Abs Immature Granulocytes: 0.04 10*3/uL (ref 0.00–0.07)
Basophils Absolute: 0.1 10*3/uL (ref 0.0–0.1)
Basophils Relative: 1 %
Eosinophils Absolute: 0.1 10*3/uL (ref 0.0–0.5)
Eosinophils Relative: 1 %
HCT: 39.3 % (ref 39.0–52.0)
Hemoglobin: 12.4 g/dL — ABNORMAL LOW (ref 13.0–17.0)
Immature Granulocytes: 0 %
Lymphocytes Relative: 23 %
Lymphs Abs: 2.8 10*3/uL (ref 0.7–4.0)
MCH: 27.7 pg (ref 26.0–34.0)
MCHC: 31.6 g/dL (ref 30.0–36.0)
MCV: 87.7 fL (ref 80.0–100.0)
Monocytes Absolute: 0.9 10*3/uL (ref 0.1–1.0)
Monocytes Relative: 8 %
Neutro Abs: 8.4 10*3/uL — ABNORMAL HIGH (ref 1.7–7.7)
Neutrophils Relative %: 67 %
Platelets: 330 10*3/uL (ref 150–400)
RBC: 4.48 MIL/uL (ref 4.22–5.81)
RDW: 13.6 % (ref 11.5–15.5)
WBC: 12.3 10*3/uL — ABNORMAL HIGH (ref 4.0–10.5)
nRBC: 0 % (ref 0.0–0.2)

## 2019-01-24 LAB — COMPREHENSIVE METABOLIC PANEL
ALT: 22 U/L (ref 0–44)
AST: 17 U/L (ref 15–41)
Albumin: 3.2 g/dL — ABNORMAL LOW (ref 3.5–5.0)
Alkaline Phosphatase: 44 U/L (ref 38–126)
Anion gap: 12 (ref 5–15)
BUN: 28 mg/dL — ABNORMAL HIGH (ref 6–20)
CO2: 25 mmol/L (ref 22–32)
Calcium: 8.5 mg/dL — ABNORMAL LOW (ref 8.9–10.3)
Chloride: 98 mmol/L (ref 98–111)
Creatinine, Ser: 1.19 mg/dL (ref 0.61–1.24)
GFR calc Af Amer: >60 mL/min (ref >60)
GFR calc non Af Amer: >60 mL/min (ref >60)
Glucose, Bld: 253 mg/dL — ABNORMAL HIGH (ref 70–99)
Potassium: 4.6 mmol/L (ref 3.5–5.1)
Sodium: 135 mmol/L (ref 135–145)
Total Bilirubin: 0.5 mg/dL (ref 0.3–1.2)
Total Protein: 6.3 g/dL — ABNORMAL LOW (ref 6.5–8.1)

## 2019-01-24 LAB — PROTIME-INR
INR: 1 (ref 0.8–1.2)
Prothrombin Time: 13.3 seconds (ref 11.4–15.2)

## 2019-01-24 LAB — SAMPLE TO BLOOD BANK

## 2019-01-24 NOTE — ED Triage Notes (Signed)
Patient reports bright red bloody stools this afternoon , S/P colonoscopy last Friday , denies emesis or fever , no abdominal pain .

## 2019-01-25 ENCOUNTER — Observation Stay (HOSPITAL_COMMUNITY): Payer: Managed Care, Other (non HMO) | Admitting: Anesthesiology

## 2019-01-25 ENCOUNTER — Encounter (HOSPITAL_COMMUNITY)
Admission: EM | Disposition: A | Discharge status: Home / Self Care | Payer: Self-pay | Source: Home / Self Care | Attending: Internal Medicine

## 2019-01-25 ENCOUNTER — Encounter (HOSPITAL_COMMUNITY): Payer: Self-pay | Admitting: Internal Medicine

## 2019-01-25 DIAGNOSIS — I10 Essential (primary) hypertension: Secondary | ICD-10-CM | POA: Diagnosis present

## 2019-01-25 DIAGNOSIS — Z800 Family history of malignant neoplasm of digestive organs: Secondary | ICD-10-CM | POA: Diagnosis not present

## 2019-01-25 DIAGNOSIS — E785 Hyperlipidemia, unspecified: Secondary | ICD-10-CM | POA: Diagnosis present

## 2019-01-25 DIAGNOSIS — Z6839 Body mass index (BMI) 39.0-39.9, adult: Secondary | ICD-10-CM | POA: Diagnosis not present

## 2019-01-25 DIAGNOSIS — Z7982 Long term (current) use of aspirin: Secondary | ICD-10-CM | POA: Diagnosis not present

## 2019-01-25 DIAGNOSIS — K625 Hemorrhage of anus and rectum: Secondary | ICD-10-CM

## 2019-01-25 DIAGNOSIS — D509 Iron deficiency anemia, unspecified: Secondary | ICD-10-CM | POA: Diagnosis present

## 2019-01-25 DIAGNOSIS — Z86010 Personal history of colonic polyps: Secondary | ICD-10-CM | POA: Diagnosis not present

## 2019-01-25 DIAGNOSIS — K633 Ulcer of intestine: Secondary | ICD-10-CM | POA: Diagnosis present

## 2019-01-25 DIAGNOSIS — Z794 Long term (current) use of insulin: Secondary | ICD-10-CM | POA: Diagnosis not present

## 2019-01-25 DIAGNOSIS — K922 Gastrointestinal hemorrhage, unspecified: Secondary | ICD-10-CM | POA: Diagnosis present

## 2019-01-25 DIAGNOSIS — Z9889 Other specified postprocedural states: Secondary | ICD-10-CM | POA: Diagnosis not present

## 2019-01-25 DIAGNOSIS — E1169 Type 2 diabetes mellitus with other specified complication: Secondary | ICD-10-CM | POA: Diagnosis present

## 2019-01-25 DIAGNOSIS — Z20822 Contact with and (suspected) exposure to covid-19: Secondary | ICD-10-CM | POA: Diagnosis present

## 2019-01-25 DIAGNOSIS — D62 Acute posthemorrhagic anemia: Secondary | ICD-10-CM | POA: Diagnosis present

## 2019-01-25 DIAGNOSIS — Z8371 Family history of colonic polyps: Secondary | ICD-10-CM | POA: Diagnosis not present

## 2019-01-25 DIAGNOSIS — Z833 Family history of diabetes mellitus: Secondary | ICD-10-CM | POA: Diagnosis not present

## 2019-01-25 DIAGNOSIS — Z79899 Other long term (current) drug therapy: Secondary | ICD-10-CM | POA: Diagnosis not present

## 2019-01-25 DIAGNOSIS — Z89421 Acquired absence of other right toe(s): Secondary | ICD-10-CM | POA: Diagnosis not present

## 2019-01-25 HISTORY — PX: HEMOSTASIS CLIP PLACEMENT: SHX6857

## 2019-01-25 HISTORY — PX: COLONOSCOPY: SHX5424

## 2019-01-25 LAB — COMPREHENSIVE METABOLIC PANEL
ALT: 21 U/L (ref 0–44)
AST: 18 U/L (ref 15–41)
Albumin: 2.9 g/dL — ABNORMAL LOW (ref 3.5–5.0)
Alkaline Phosphatase: 40 U/L (ref 38–126)
Anion gap: 12 (ref 5–15)
BUN: 34 mg/dL — ABNORMAL HIGH (ref 6–20)
CO2: 23 mmol/L (ref 22–32)
Calcium: 7.7 mg/dL — ABNORMAL LOW (ref 8.9–10.3)
Chloride: 103 mmol/L (ref 98–111)
Creatinine, Ser: 1.18 mg/dL (ref 0.61–1.24)
GFR calc Af Amer: >60 mL/min (ref >60)
GFR calc non Af Amer: >60 mL/min (ref >60)
Glucose, Bld: 245 mg/dL — ABNORMAL HIGH (ref 70–99)
Potassium: 4.4 mmol/L (ref 3.5–5.1)
Sodium: 138 mmol/L (ref 135–145)
Total Bilirubin: 0.2 mg/dL — ABNORMAL LOW (ref 0.3–1.2)
Total Protein: 5.3 g/dL — ABNORMAL LOW (ref 6.5–8.1)

## 2019-01-25 LAB — CBC
HCT: 32.7 % — ABNORMAL LOW (ref 39.0–52.0)
HCT: 26.4 % — ABNORMAL LOW (ref 39.0–52.0)
Hemoglobin: 10.5 g/dL — ABNORMAL LOW (ref 13.0–17.0)
Hemoglobin: 8.7 g/dL — ABNORMAL LOW (ref 13.0–17.0)
MCH: 28.1 pg (ref 26.0–34.0)
MCH: 28.4 pg (ref 26.0–34.0)
MCHC: 32.1 g/dL (ref 30.0–36.0)
MCHC: 33 g/dL (ref 30.0–36.0)
MCV: 87.4 fL (ref 80.0–100.0)
MCV: 86.3 fL (ref 80.0–100.0)
Platelets: 285 10*3/uL (ref 150–400)
Platelets: 260 10*3/uL (ref 150–400)
RBC: 3.74 MIL/uL — ABNORMAL LOW (ref 4.22–5.81)
RBC: 3.06 MIL/uL — ABNORMAL LOW (ref 4.22–5.81)
RDW: 13.6 % (ref 11.5–15.5)
RDW: 13.9 % (ref 11.5–15.5)
WBC: 14.2 10*3/uL — ABNORMAL HIGH (ref 4.0–10.5)
WBC: 12.4 10*3/uL — ABNORMAL HIGH (ref 4.0–10.5)
nRBC: 0 % (ref 0.0–0.2)
nRBC: 0 % (ref 0.0–0.2)

## 2019-01-25 LAB — RESPIRATORY PANEL BY RT PCR (FLU A&B, COVID)
Influenza A by PCR: NEGATIVE
Influenza B by PCR: NEGATIVE
SARS Coronavirus 2 by RT PCR: NEGATIVE

## 2019-01-25 LAB — HIV ANTIBODY (ROUTINE TESTING W REFLEX): HIV Screen 4th Generation wRfx: NONREACTIVE

## 2019-01-25 LAB — GLUCOSE, CAPILLARY
Glucose-Capillary: 242 mg/dL — ABNORMAL HIGH (ref 70–99)
Glucose-Capillary: 173 mg/dL — ABNORMAL HIGH (ref 70–99)
Glucose-Capillary: 201 mg/dL — ABNORMAL HIGH (ref 70–99)
Glucose-Capillary: 245 mg/dL — ABNORMAL HIGH (ref 70–99)

## 2019-01-25 LAB — HEMOGLOBIN AND HEMATOCRIT, BLOOD
HCT: 36 % — ABNORMAL LOW (ref 39.0–52.0)
HCT: 25.3 % — ABNORMAL LOW (ref 39.0–52.0)
Hemoglobin: 11.5 g/dL — ABNORMAL LOW (ref 13.0–17.0)
Hemoglobin: 8.3 g/dL — ABNORMAL LOW (ref 13.0–17.0)

## 2019-01-25 LAB — TYPE AND SCREEN
ABO/RH(D): A POS
Antibody Screen: NEGATIVE

## 2019-01-25 LAB — HEMOGLOBIN A1C
Hgb A1c MFr Bld: 8 % — ABNORMAL HIGH (ref 4.8–5.6)
Mean Plasma Glucose: 182.9 mg/dL

## 2019-01-25 LAB — ABO/RH: ABO/RH(D): A POS

## 2019-01-25 LAB — CBG MONITORING, ED: Glucose-Capillary: 231 mg/dL — ABNORMAL HIGH (ref 70–99)

## 2019-01-25 SURGERY — COLONOSCOPY
Anesthesia: Monitor Anesthesia Care

## 2019-01-25 MED ORDER — PROPOFOL 10 MG/ML IV BOLUS
INTRAVENOUS | Status: DC | PRN
  Administered 2019-01-25: 10 mg via INTRAVENOUS
  Administered 2019-01-25: 20 mg via INTRAVENOUS

## 2019-01-25 MED ORDER — IRBESARTAN 300 MG PO TABS
300.0000 mg | ORAL_TABLET | Freq: Every day | ORAL | Status: DC
  Administered 2019-01-25 – 2019-01-26 (×2): 300 mg via ORAL
  Filled 2019-01-25 (×2): qty 1

## 2019-01-25 MED ORDER — OMEGA-3-ACID ETHYL ESTERS 1 G PO CAPS
1.0000 | ORAL_CAPSULE | Freq: Two times a day (BID) | ORAL | Status: DC
  Administered 2019-01-25 – 2019-01-26 (×3): 1 g via ORAL
  Filled 2019-01-25 (×3): qty 1

## 2019-01-25 MED ORDER — PEG 3350-KCL-NA BICARB-NACL 420 G PO SOLR
4000.0000 mL | Freq: Once | ORAL | Status: AC
  Administered 2019-01-25: 4000 mL via ORAL
  Filled 2019-01-25: qty 4000

## 2019-01-25 MED ORDER — SODIUM CHLORIDE 0.9 % IV SOLN: INTRAVENOUS | Status: DC

## 2019-01-25 MED ORDER — HYDROCHLOROTHIAZIDE 25 MG PO TABS
25.0000 mg | ORAL_TABLET | Freq: Every day | ORAL | Status: DC
  Administered 2019-01-25 – 2019-01-26 (×2): 25 mg via ORAL
  Filled 2019-01-25 (×2): qty 1

## 2019-01-25 MED ORDER — SODIUM CHLORIDE 0.9 % IV BOLUS
1000.0000 mL | Freq: Once | INTRAVENOUS | Status: AC
  Administered 2019-01-25: 1000 mL via INTRAVENOUS

## 2019-01-25 MED ORDER — PHENYLEPHRINE 40 MCG/ML (10ML) SYRINGE FOR IV PUSH (FOR BLOOD PRESSURE SUPPORT)
PREFILLED_SYRINGE | INTRAVENOUS | Status: DC | PRN
  Administered 2019-01-25: 80 ug via INTRAVENOUS

## 2019-01-25 MED ORDER — INSULIN ASPART 100 UNIT/ML ~~LOC~~ SOLN
0.0000 [IU] | SUBCUTANEOUS | Status: DC
  Administered 2019-01-25 (×4): 3 [IU] via SUBCUTANEOUS
  Administered 2019-01-26: 2 [IU] via SUBCUTANEOUS
  Administered 2019-01-26 (×2): 1 [IU] via SUBCUTANEOUS

## 2019-01-25 MED ORDER — SODIUM CHLORIDE 0.9 % IV SOLN: INTRAVENOUS | Status: DC | PRN

## 2019-01-25 MED ORDER — ACETAMINOPHEN 650 MG RE SUPP: 650.0000 mg | Freq: Four times a day (QID) | RECTAL | Status: DC | PRN

## 2019-01-25 MED ORDER — ROSUVASTATIN CALCIUM 5 MG PO TABS
10.0000 mg | ORAL_TABLET | Freq: Every day | ORAL | Status: DC
  Administered 2019-01-25 – 2019-01-26 (×2): 10 mg via ORAL
  Filled 2019-01-25 (×2): qty 2

## 2019-01-25 MED ORDER — OLMESARTAN-AMLODIPINE-HCTZ 40-10-25 MG PO TABS: 1.0000 | ORAL_TABLET | Freq: Every day | ORAL | Status: DC

## 2019-01-25 MED ORDER — SODIUM CHLORIDE 0.9 % IV SOLN: Freq: Once | INTRAVENOUS | Status: AC

## 2019-01-25 MED ORDER — ACETAMINOPHEN 325 MG PO TABS: 650.0000 mg | ORAL_TABLET | Freq: Four times a day (QID) | ORAL | Status: DC | PRN

## 2019-01-25 MED ORDER — PROPOFOL 500 MG/50ML IV EMUL
INTRAVENOUS | Status: DC | PRN
  Administered 2019-01-25: 100 ug/kg/min via INTRAVENOUS

## 2019-01-25 MED ORDER — ONDANSETRON HCL 4 MG/2ML IJ SOLN
4.0000 mg | Freq: Four times a day (QID) | INTRAMUSCULAR | Status: DC | PRN
  Administered 2019-01-25: 4 mg via INTRAVENOUS
  Filled 2019-01-25: qty 2

## 2019-01-25 MED ORDER — SODIUM CHLORIDE 0.9 % IV SOLN: INTRAVENOUS | Status: AC

## 2019-01-25 NOTE — Anesthesia Preprocedure Evaluation (Addendum)
Anesthesia Evaluation  Patient identified by MRN, date of birth, ID band Patient awake    Reviewed: Allergy & Precautions, NPO status , Patient's Chart, lab work & pertinent test results  History of Anesthesia Complications Negative for: history of anesthetic complications  Airway Mallampati: III  TM Distance: >3 FB Neck ROM: Full    Dental no notable dental hx. (+) Dental Advisory Given   Pulmonary neg pulmonary ROS,    Pulmonary exam normal        Cardiovascular hypertension, Normal cardiovascular exam     Neuro/Psych negative neurological ROS  negative psych ROS   GI/Hepatic Neg liver ROS,   Endo/Other  diabetesMorbid obesity  Renal/GU Renal InsufficiencyRenal disease     Musculoskeletal negative musculoskeletal ROS (+)   Abdominal   Peds  Hematology negative hematology ROS (+) anemia ,   Anesthesia Other Findings Day of surgery medications reviewed with the patient.  Reproductive/Obstetrics                            Anesthesia Physical Anesthesia Plan  ASA: III  Anesthesia Plan: MAC   Post-op Pain Management:    Induction:   PONV Risk Score and Plan: Ondansetron and Propofol infusion  Airway Management Planned: Natural Airway  Additional Equipment:   Intra-op Plan:   Post-operative Plan:   Informed Consent: I have reviewed the patients History and Physical, chart, labs and discussed the procedure including the risks, benefits and alternatives for the proposed anesthesia with the patient or authorized representative who has indicated his/her understanding and acceptance.     Dental advisory given  Plan Discussed with: CRNA and Anesthesiologist  Anesthesia Plan Comments:        Anesthesia Quick Evaluation

## 2019-01-25 NOTE — Consult Note (Signed)
[facility] Reason for Consult: Rectal bleeding. Referring Physician: [facility] hospitalist.  [name] is an 54 y.o. male.  HPI: [name] is a 54 year old white male with multiple medical problems listed below, who contacted me yesterday with complaints of rectal bleeding that started about 3 PM after he had several loose stools. Patient gives a history of having a colonoscopy with multiple polyps removed on 01/17/2019 done by Dr. Nicki Guadalajara. Patient claims he has not taken any nonsteroidals since the procedure except for Aspirin. He denies having any abdominal pain nausea vomiting. He denied having any syncope or near syncope but felt somewhat weak. There is no history of melena  He was advised to come to the emergency room. Patient was found to be orthostatic in the emergency room and was resuscitated with IV fluids.  His hemoglobin on admission was 12.4 gms/dl and has dropped 10.5 gms/dl as of 4:00 this morning. Patient is finishing his prep for colonoscopy that is planned later this afternoon. Patient's father was diagnosed with colon cancer at 86.  This is the patient's second colonoscopy.  The first 1 was done in 2017 when multiple tubular adenomas were removed.  Past Medical History:  Diagnosis Date  . Broken ankle   . Broken arm   . Diabetes mellitus without complication ([place])    type 2  . History of Bell's palsy   . Hyperlipidemia   . Hypertension   . Osteomyelitis ([place])    foot   Past Surgical History:  Procedure Laterality Date  . AMPUTATION Right 03/04/2015   Procedure: PARTIAL AMPUTATION RIGHT 5TH METATARSAL;  Surgeon: Landis Martins, DPM;  Location: [facility];  Service: Podiatry;  Laterality: Right;  . APPENDECTOMY    . COLONOSCOPY  11/17/2005   TAs - [place]  . EYE SURGERY Bilateral    blood vessels -cautery  . I & D EXTREMITY Right 03/04/2015   Procedure: IRRIGATION AND DEBRIDEMENT RIGHT FOOT;  Surgeon: Landis Martins, DPM;  Location: [facility];   Service: Podiatry;  Laterality: Right;  . WISDOM TOOTH EXTRACTION     only 2 ext   Family History  Problem Relation Age of Onset  . Diabetes Father   . Colon cancer Father   . Colon polyps Father   . Esophageal cancer Neg Hx   . Rectal cancer Neg Hx   . Stomach cancer Neg Hx    Social History:  reports that he has never smoked. He has never used smokeless tobacco. He reports that he does not drink alcohol or use drugs.  Allergies: No Known Allergies  Medications: I have reviewed the patient's current medications.  Results for orders placed or performed during the hospital encounter of 01/24/19 (from the past 48 hour(s))  Sample to Blood Bank     Status: None   Collection Time: 01/24/19  9:43 PM  Result Value Ref Range   Blood Bank Specimen SAMPLE AVAILABLE FOR TESTING    Sample Expiration      01/25/2019,2359 Performed at [facility], [place] [address] [place], [place] [address]   Type and screen [place]     Status: None   Collection Time: 01/24/19  9:43 PM  Result Value Ref Range   ABO/RH(D) A POS    Antibody Screen NEG    Sample Expiration      01/27/2019,2359 Performed at [facility], [place] [address], [place], [place] [address]   ABO/Rh     Status: None (Preliminary  result)   Collection Time: 01/24/19  9:43 PM  Result Value Ref Range   ABO/RH(D)      A POS Performed at [facility], [place] [address], [place], [place] [address]   Protime-INR     Status: None   Collection Time: 01/24/19  9:47 PM  Result Value Ref Range   Prothrombin Time 13.3 11.4 - 15.2 seconds   INR 1.0 0.8 - 1.2    Comment: (NOTE) INR goal varies based on device and disease states. Performed at [facility], [place] [address], [place], [place] [address]   CBC with Differential     Status: Abnormal   Collection Time: 01/24/19  9:47 PM  Result Value Ref Range   WBC 12.3 (H) 4.0 - 10.5 K/uL   RBC 4.48 4.22 - 5.81 MIL/uL   Hemoglobin 12.4 (L) 13.0 -  17.0 g/dL   HCT 39.3 39.0 - 52.0 %   MCV 87.7 80.0 - 100.0 fL   MCH 27.7 26.0 - 34.0 pg   MCHC 31.6 30.0 - 36.0 g/dL   RDW 13.6 11.5 - 15.5 %   Platelets 330 150 - 400 K/uL   nRBC 0.0 0.0 - 0.2 %   Neutrophils Relative % 67 %   Neutro Abs 8.4 (H) 1.7 - 7.7 K/uL   Lymphocytes Relative 23 %   Lymphs Abs 2.8 0.7 - 4.0 K/uL   Monocytes Relative 8 %   Monocytes Absolute 0.9 0.1 - 1.0 K/uL   Eosinophils Relative 1 %   Eosinophils Absolute 0.1 0.0 - 0.5 K/uL   Basophils Relative 1 %   Basophils Absolute 0.1 0.0 - 0.1 K/uL   Immature Granulocytes 0 %   Abs Immature Granulocytes 0.04 0.00 - 0.07 K/uL    Comment: Performed at [facility] [address] [place], [place] [address]  Comprehensive metabolic panel     Status: Abnormal   Collection Time: 01/24/19  9:47 PM  Result Value Ref Range   Sodium 135 135 - 145 mmol/L   Potassium 4.6 3.5 - 5.1 mmol/L   Chloride 98 98 - 111 mmol/L   CO2 25 22 - 32 mmol/L   Glucose, Bld 253 (H) 70 - 99 mg/dL   BUN 28 (H) 6 - 20 mg/dL   Creatinine, Ser 1.19 0.61 - 1.24 mg/dL   Calcium 8.5 (L) 8.9 - 10.3 mg/dL   Total Protein 6.3 (L) 6.5 - 8.1 g/dL   Albumin 3.2 (L) 3.5 - 5.0 g/dL   AST 17 15 - 41 U/L   ALT 22 0 - 44 U/L   Alkaline Phosphatase 44 38 - 126 U/L   Total Bilirubin 0.5 0.3 - 1.2 mg/dL   GFR calc non Af Amer >60 >60 mL/min   GFR calc Af Amer >60 >60 mL/min   Anion gap 12 5 - 15    Comment: Performed at [facility] [address], [place], [place] [address]  Hemoglobin and hematocrit, blood     Status: Abnormal   Collection Time: 01/25/19  1:16 AM  Result Value Ref Range   Hemoglobin 11.5 (L) 13.0 - 17.0 g/dL   HCT 36.0 (L) 39.0 - 52.0 %    Comment: Performed at [facility] [address] [place], [place] [address]  Respiratory Panel by RT PCR (Flu A&B, Covid) - Nasopharyngeal Swab     Status: None   Collection Time: 01/25/19  3:27 AM   Specimen: Nasopharyngeal Swab  Result  Value Ref Range   SARS Coronavirus  2 by RT PCR NEGATIVE NEGATIVE    Comment: (NOTE) SARS-CoV-2 target nucleic acids are NOT DETECTED. The SARS-CoV-2 RNA is generally detectable in upper respiratoy specimens during the acute phase of infection. The lowest concentration of SARS-CoV-2 viral copies this assay can detect is 131 copies/mL. A negative result does not preclude SARS-Cov-2 infection and should not be used as the sole basis for treatment or other patient management decisions. A negative result may occur with  improper specimen collection/handling, submission of specimen other than nasopharyngeal swab, presence of viral mutation(s) within the areas targeted by this assay, and inadequate number of viral copies (<131 copies/mL). A negative result must be combined with clinical observations, patient history, and epidemiological information. The expected result is Negative. Fact Sheet for Patients:  PinkCheek.be Fact Sheet for Healthcare Providers:  GravelBags.it This test is not yet ap proved or cleared by the [place] FDA and  has been authorized for detection and/or diagnosis of SARS-CoV-2 by FDA under an Emergency Use Authorization (EUA). This EUA will remain  in effect (meaning this test can be used) for the duration of the COVID-19 declaration under Section 564(b)(1) of the Act, 21 U.S.C. section 360bbb-3(b)(1), unless the authorization is terminated or revoked sooner.    Influenza A by PCR NEGATIVE NEGATIVE   Influenza B by PCR NEGATIVE NEGATIVE    Comment: (NOTE) The Xpert Xpress SARS-CoV-2/FLU/RSV assay is intended as an aid in  the diagnosis of influenza from Nasopharyngeal swab specimens and  should not be used as a sole basis for treatment. Nasal washings and  aspirates are unacceptable for Xpert Xpress SARS-CoV-2/FLU/RSV  testing. Fact Sheet for Patients: PinkCheek.be Fact Sheet for Healthcare  Providers: GravelBags.it This test is not yet approved or cleared by the [place] FDA and  has been authorized for detection and/or diagnosis of SARS-CoV-2 by  FDA under an Emergency Use Authorization (EUA). This EUA will remain  in effect (meaning this test can be used) for the duration of the  Covid-19 declaration under Section 564(b)(1) of the Act, 21  U.S.C. section 360bbb-3(b)(1), unless the authorization is  terminated or revoked. Performed at [facility], [place] [address], [place], [place] [address]   Comprehensive metabolic panel     Status: Abnormal   Collection Time: 01/25/19  4:06 AM  Result Value Ref Range   Sodium 138 135 - 145 mmol/L   Potassium 4.4 3.5 - 5.1 mmol/L   Chloride 103 98 - 111 mmol/L   CO2 23 22 - 32 mmol/L   Glucose, Bld 245 (H) 70 - 99 mg/dL   BUN 34 (H) 6 - 20 mg/dL   Creatinine, Ser 1.18 0.61 - 1.24 mg/dL   Calcium 7.7 (L) 8.9 - 10.3 mg/dL   Total Protein 5.3 (L) 6.5 - 8.1 g/dL   Albumin 2.9 (L) 3.5 - 5.0 g/dL   AST 18 15 - 41 U/L   ALT 21 0 - 44 U/L   Alkaline Phosphatase 40 38 - 126 U/L   Total Bilirubin 0.2 (L) 0.3 - 1.2 mg/dL   GFR calc non Af Amer >60 >60 mL/min   GFR calc Af Amer >60 >60 mL/min   Anion gap 12 5 - 15    Comment: Performed at [facility], [place] [address] [place], [place] [address]  CBC     Status: Abnormal   Collection Time: 01/25/19  4:06 AM  Result Value Ref Range   WBC 14.2 (  H) 4.0 - 10.5 K/uL   RBC 3.74 (L) 4.22 - 5.81 MIL/uL   Hemoglobin 10.5 (L) 13.0 - 17.0 g/dL   HCT 32.7 (L) 39.0 - 52.0 %   MCV 87.4 80.0 - 100.0 fL   MCH 28.1 26.0 - 34.0 pg   MCHC 32.1 30.0 - 36.0 g/dL   RDW 13.6 11.5 - 15.5 %   Platelets 285 150 - 400 K/uL   nRBC 0.0 0.0 - 0.2 %    Comment: Performed at [facility], [place] [address], [place], [place] [address]  CBG monitoring, ED     Status: Abnormal   Collection Time: 01/25/19  4:38 AM  Result Value Ref Range   Glucose-Capillary 231 (H) 70  - 99 mg/dL   Comment 1 Notify RN    Review of Systems  Constitutional: Negative.   HENT: Negative.   Eyes: Negative.   Respiratory: Negative.   Gastrointestinal: Positive for anal bleeding, blood in stool and diarrhea. Negative for abdominal distention, abdominal pain, constipation, nausea and rectal pain.  Endocrine: Negative.   Genitourinary: Negative.   Musculoskeletal: Negative.   Skin: Negative.    Blood pressure 117/83, pulse (!) 105, temperature 98.1 F (36.7 C), temperature source Oral, resp. rate 18, SpO2 99 %. Physical Exam  Constitutional: He is oriented to person, place, and time. He appears well-developed and well-nourished.  Morbidly obese, pale appearing white male  HENT:  Head: Normocephalic and atraumatic.  Eyes: Pupils are equal, round, and reactive to light. Conjunctivae and EOM are normal.  Cardiovascular: Normal rate and regular rhythm.  Respiratory: Effort normal and breath sounds normal.  GI: Soft. Bowel sounds are normal.  Musculoskeletal:        General: Normal range of motion.     Cervical back: Normal range of motion and neck supple.  Neurological: He is alert and oriented to person, place, and time.  Skin: Skin is warm and dry.  Psychiatric: He has a normal mood and affect. His behavior is normal. Judgment and thought content normal.   Assessment/Plan: 1) Post polypectomy rectal bleeding with ABLA-I will proceed with a colonoscopy this afternoon.  2) Family history of colon cancer in the father with personal history of multiple colonic polyps.  3) Hypertension/Hyperlipidemia/AODM. Juanita Craver 01/25/2019, 7:12 AM

## 2019-01-25 NOTE — ED Notes (Signed)
Pt had 1 episode BRB per rectum. Stand by assist to restroom. Pt returned to bed, call bell within reach, drinking golytely. Report given to Will, RN

## 2019-01-25 NOTE — ED Notes (Signed)
Checked CBG 231, informed RN [employer]

## 2019-01-25 NOTE — ED Notes (Signed)
Assisted pt to restroom via w/c. Pt had large bright red bowel movement. VSS stable on return to room.

## 2019-01-25 NOTE — ED Notes (Signed)
Pt c/o nausea r/t bowel prep

## 2019-01-25 NOTE — Transfer of Care (Signed)
Immediate Anesthesia Transfer of Care Note  Patient: [name]  Procedure(s) Performed: COLONOSCOPY (N/A ) HEMOSTASIS CLIP PLACEMENT  Patient Location: Endoscopy Unit  Anesthesia Type:MAC  Level of Consciousness: awake, alert  and oriented  Airway & Oxygen Therapy: Patient Spontanous Breathing  Post-op Assessment: Report given to RN and Post -op Vital signs reviewed and stable  Post vital signs: Reviewed and stable  Last Vitals:  Vitals Value Taken Time  BP 95/57 01/25/19 1253  Temp    Pulse 82 01/25/19 1253  Resp 20 01/25/19 1253  SpO2 99 % 01/25/19 1253    Last Pain:  Vitals:   01/25/19 1253  TempSrc:   PainSc: 0-No pain         Complications: No apparent anesthesia complications

## 2019-01-25 NOTE — Progress Notes (Signed)
This is a 54 year old male who was admitted early this morning by Dr. Maudie Mercury with a history of hypertension, hyperlipidemia, type 2 diabetes, family history of colon cancer who recently underwent colonoscopy 01/17/2019 with polypectomy and presented with complaints of bright red blood per rectum since 3 PM yesterday.  He underwent colonoscopy 1/23 by Dr. Collene Mares and had several clips placed recommended to observe overnight.  At bedside patient complaining of recurrent bright red blood per rectum prior to colonoscopy this morning. Otherwise denies any complaints   General: AAOx3, pale Heart: S1 and S2 auscultated Lungs: Clear to auscultation bilaterally Abdominal: Nontender, nondistended  A/P 1. Bright red blood per rectum post polypectomy status post repeat colonoscopy 1/23 clips placed 1. Avoid NSAIDs 2. Trend H&H and transfuse if Hb<7.0  3. We will continue to observe and likely discharge tomorrow morning  Marva Panda, DO

## 2019-01-25 NOTE — Plan of Care (Signed)
  Problem: Education: Goal: Knowledge of General Education information will improve Description: Including pain rating scale, medication(s)/side effects and non-pharmacologic comfort measures Outcome: Completed/Met   Problem: Health Behavior/Discharge Planning: Goal: Ability to manage health-related needs will improve Outcome: Completed/Met   Problem: Clinical Measurements: Goal: Ability to maintain clinical measurements within normal limits will improve Outcome: Completed/Met Goal: Will remain free from infection Outcome: Completed/Met Goal: Diagnostic test results will improve Outcome: Completed/Met Goal: Cardiovascular complication will be avoided Outcome: Completed/Met   Problem: Nutrition: Goal: Adequate nutrition will be maintained Outcome: Progressing   Problem: Coping: Goal: Level of anxiety will decrease Outcome: Progressing   Problem: Elimination: Goal: Will not experience complications related to bowel motility Outcome: Progressing Goal: Will not experience complications related to urinary retention Outcome: Completed/Met   Problem: Pain Managment: Goal: General experience of comfort will improve Outcome: Completed/Met   Problem: Safety: Goal: Ability to remain free from injury will improve Outcome: Completed/Met   Problem: Skin Integrity: Goal: Risk for impaired skin integrity will decrease Outcome: Progressing   Problem: Education: Goal: Ability to identify signs and symptoms of gastrointestinal bleeding will improve Outcome: Completed/Met   Problem: Bowel/Gastric: Goal: Will show no signs and symptoms of gastrointestinal bleeding Outcome: Progressing   Problem: Fluid Volume: Goal: Will show no signs and symptoms of excessive bleeding Outcome: Progressing   Problem: Clinical Measurements: Goal: Complications related to the disease process, condition or treatment will be avoided or minimized Outcome: Progressing

## 2019-01-25 NOTE — H&P (Signed)
TRH H&P    Patient Demographics:    [name], is a 54 y.o. male  MRN: [id number]  DOB - 02/07/1965  Admit Date - 01/24/2019  Referring MD/NP/PA:  Ferne Reus  Outpatient Primary MD for the patient is Avva, Steva Ready, MD  Patient coming from:  home  Chief complaint-  Rectal bleeding   HPI:    [name]  is a 54 y.o. male,  w hypertension, hyperlipidemia, Dm2, + family hx of colon cancer, underwent colonoscopy 01/17/19 w polypectomy apparently presents with c/o rectal bleeding since 3pm yesterday. slight loose stool.  Pt denies fever, chills, n/v, abd pain, black stool.    In ED,  T 97.5, P 100 R 20, Bp 127/73  Pox 99% on RA  INR 1.0 Wbc 12.3, Hgb 12.4, Plt 330 Na 135, K 4.6,  Bun 28, Creatinine 1.19 Alb 3.2 Ast 17, Alt 22  ED spoke with Dr. Collene Mares who will perform colonoscopy this am and requested medicine admission.   Pt will be admitted for rectal bleeding.          Review of systems:    In addition to the HPI above,  No Fever-chills, No Headache, No changes with Vision or hearing, No problems swallowing food or Liquids, No Chest pain, Cough or Shortness of Breath, No Abdominal pain, No Nausea or Vomiting, No Blood in   Urine, No dysuria, No new skin rashes or bruises, No new joints pains-aches,  No new weakness, tingling, numbness in any extremity, No recent weight gain or loss, No polyuria, polydypsia or polyphagia, No significant Mental Stressors.  All other systems reviewed and are negative.    Past History of the following :    Past Medical History:  Diagnosis Date  . Broken ankle   . Broken arm   . Diabetes mellitus without complication ([place])    type 2  . History of Bell's palsy   . Hyperlipidemia   . Hypertension   . Osteomyelitis ([place])    foot      Past Surgical History:  Procedure Laterality Date  . AMPUTATION Right 03/04/2015   Procedure: PARTIAL AMPUTATION RIGHT 5TH METATARSAL;  Surgeon: Landis Martins, DPM;  Location: [facility];  Service: Podiatry;  Laterality: Right;  . APPENDECTOMY    . COLONOSCOPY  11/17/2005   TAs - [facility]  . EYE SURGERY Bilateral    blood vessels -cautery  . I & D EXTREMITY Right 03/04/2015   Procedure: IRRIGATION AND DEBRIDEMENT RIGHT FOOT;  Surgeon: Landis Martins, DPM;  Location: [facility];  Service: Podiatry;  Laterality: Right;  . WISDOM TOOTH EXTRACTION     only 2 ext      Social History:      Social History   Tobacco Use  . Smoking status: Never Smoker  . Smokeless tobacco: Never Used  Substance Use Topics  . Alcohol use: No       Family History :     Family History  Problem Relation Age of Onset  . Diabetes Father   . Colon cancer Father   .  Colon polyps Father   . Esophageal cancer Neg Hx   . Rectal cancer Neg Hx   . Stomach cancer Neg Hx        Home Medications:   Prior to Admission medications   Medication Sig Start Date End Date Taking? Authorizing Provider  aspirin EC 81 MG tablet Take 81 mg by mouth daily.   Yes [provider]  empagliflozin (JARDIANCE) 25 MG TABS tablet Take 25 mg by mouth daily.   Yes [provider]  fluticasone (FLONASE ALLERGY RELIEF) 50 MCG/ACT nasal spray Place 2 sprays into both nostrils daily as needed for allergies or rhinitis.   Yes [provider]  glimepiride (AMARYL) 2 MG tablet Take 2 mg by mouth 2 (two) times daily. 10/26/14  Yes [provider]  Insulin Degludec (TRESIBA FLEXTOUCH) 200 UNIT/ML SOPN Inject 100 Units into the skin daily before breakfast.    Yes [provider]  metFORMIN (GLUCOPHAGE) 1000 MG tablet Take 1,000 mg by mouth 2 (two) times daily. 11/09/14  Yes [provider]  Multiple Vitamin (MULTIVITAMIN) tablet Take 1 tablet by mouth daily.   Yes [provider]  Olmesartan-Amlodipine-HCTZ 40-10-25 MG TABS Take 1 tablet by mouth daily. Reported on  02/04/2015 12/28/14  Yes [provider]  omega-3 acid ethyl esters (LOVAZA) 1 g capsule Take 1 capsule by mouth 2 (two) times daily.   Yes [provider]  rosuvastatin (CRESTOR) 10 MG tablet Take 10 mg by mouth daily. 01/03/19  Yes [provider]     Allergies:    No Known Allergies   Physical Exam:   Vitals  Blood pressure 123/67, pulse 90, temperature (!) 97.5 F (36.4 C), temperature source Oral, resp. rate 18, SpO2 98 %.  1.  General: axoxo3  2. Psychiatric: euthymic  3. Neurologic: nonfocal  4. HEENMT:  Anicteric, pupils 1.60mm symmetric, direct, consensual, near intact Neck: no jvd  5. Respiratory : CTAB  6. Cardiovascular : rrr s1, s2, no m/g/r  7. Gastrointestinal:  Abd: soft, nt, nd, +bs  8. Skin:  Ext: no c/c/e,  No rash  9.Musculoskeletal:  Good ROM    Data Review:    CBC Recent Labs  Lab 01/24/19 2147 01/25/19 0116 01/25/19 0406  WBC 12.3*  --  14.2*  HGB 12.4* 11.5* 10.5*  HCT 39.3 36.0* 32.7*  PLT 330  --  285  MCV 87.7  --  87.4  MCH 27.7  --  28.1  MCHC 31.6  --  32.1  RDW 13.6  --  13.6  LYMPHSABS 2.8  --   --   MONOABS 0.9  --   --   EOSABS 0.1  --   --   BASOSABS 0.1  --   --    ------------------------------------------------------------------------------------------------------------------  Results for orders placed or performed during the hospital encounter of 01/24/19 (from the past 48 hour(s))  Sample to Blood Bank     Status: None   Collection Time: 01/24/19  9:43 PM  Result Value Ref Range   Blood Bank Specimen SAMPLE AVAILABLE FOR TESTING    Sample Expiration      01/25/2019,2359 Performed at [facility], [address], [place], [place] [address]   Type and screen [place]     Status: None   Collection Time: 01/24/19  9:43 PM  Result Value Ref Range   ABO/RH(D) A POS    Antibody Screen NEG    Sample Expiration  01/27/2019,2359 Performed at [facility] [address] [place], [place] [address]   ABO/Rh     Status: None (Preliminary result)   Collection Time: 01/24/19  9:43 PM  Result Value Ref Range   ABO/RH(D)      A POS Performed at [facility] [address], [place], [place] [address]   Protime-INR     Status: None   Collection Time: 01/24/19  9:47 PM  Result Value Ref Range   Prothrombin Time 13.3 11.4 - 15.2 seconds   INR 1.0 0.8 - 1.2    Comment: (NOTE) INR goal varies based on device and disease states. Performed at [facility], [place] [address], [place], [place] [address]   CBC with Differential     Status: Abnormal   Collection Time: 01/24/19  9:47 PM  Result Value Ref Range   WBC 12.3 (H) 4.0 - 10.5 K/uL   RBC 4.48 4.22 - 5.81 MIL/uL   Hemoglobin 12.4 (L) 13.0 - 17.0 g/dL   HCT 39.3 39.0 - 52.0 %   MCV 87.7 80.0 - 100.0 fL   MCH 27.7 26.0 - 34.0 pg   MCHC 31.6 30.0 - 36.0 g/dL   RDW 13.6 11.5 - 15.5 %   Platelets 330 150 - 400 K/uL   nRBC 0.0 0.0 - 0.2 %   Neutrophils Relative % 67 %   Neutro Abs 8.4 (H) 1.7 - 7.7 K/uL   Lymphocytes Relative 23 %   Lymphs Abs 2.8 0.7 - 4.0 K/uL   Monocytes Relative 8 %   Monocytes Absolute 0.9 0.1 - 1.0 K/uL   Eosinophils Relative 1 %   Eosinophils Absolute 0.1 0.0 - 0.5 K/uL   Basophils Relative 1 %   Basophils Absolute 0.1 0.0 - 0.1 K/uL   Immature Granulocytes 0 %   Abs Immature Granulocytes 0.04 0.00 - 0.07 K/uL    Comment: Performed at [facility] [address], [place], [place] [address]  Comprehensive metabolic panel     Status: Abnormal   Collection Time: 01/24/19  9:47 PM  Result Value Ref Range   Sodium 135 135 - 145 mmol/L   Potassium 4.6 3.5 - 5.1 mmol/L   Chloride 98 98 - 111 mmol/L   CO2 25 22 - 32 mmol/L   Glucose, Bld 253 (H) 70 - 99 mg/dL   BUN 28 (H) 6 - 20 mg/dL   Creatinine, Ser 1.19 0.61 - 1.24 mg/dL   Calcium 8.5 (L) 8.9 - 10.3 mg/dL   Total Protein 6.3 (L) 6.5 - 8.1 g/dL   Albumin 3.2 (L) 3.5 - 5.0 g/dL   AST  17 15 - 41 U/L   ALT 22 0 - 44 U/L   Alkaline Phosphatase 44 38 - 126 U/L   Total Bilirubin 0.5 0.3 - 1.2 mg/dL   GFR calc non Af Amer >60 >60 mL/min   GFR calc Af Amer >60 >60 mL/min   Anion gap 12 5 - 15    Comment: Performed at [facility] [address] [place], [place] [address]  Hemoglobin and hematocrit, blood     Status: Abnormal   Collection Time: 01/25/19  1:16 AM  Result Value Ref Range   Hemoglobin 11.5 (L) 13.0 - 17.0 g/dL   HCT 36.0 (L) 39.0 - 52.0 %    Comment: Performed at [facility] [address], [place], [place] [address]  CBC     Status: Abnormal  Collection Time: 01/25/19  4:06 AM  Result Value Ref Range   WBC 14.2 (H) 4.0 - 10.5 K/uL   RBC 3.74 (L) 4.22 - 5.81 MIL/uL   Hemoglobin 10.5 (L) 13.0 - 17.0 g/dL   HCT 32.7 (L) 39.0 - 52.0 %   MCV 87.4 80.0 - 100.0 fL   MCH 28.1 26.0 - 34.0 pg   MCHC 32.1 30.0 - 36.0 g/dL   RDW 13.6 11.5 - 15.5 %   Platelets 285 150 - 400 K/uL   nRBC 0.0 0.0 - 0.2 %    Comment: Performed at [facility], [place] [address] [place], [place] [address]    Chemistries  Recent Labs  Lab 01/24/19 2147  NA 135  K 4.6  CL 98  CO2 25  GLUCOSE 253*  BUN 28*  CREATININE 1.19  CALCIUM 8.5*  AST 17  ALT 22  ALKPHOS 44  BILITOT 0.5   ------------------------------------------------------------------------------------------------------------------  ------------------------------------------------------------------------------------------------------------------ GFR: Estimated Creatinine Clearance: 107.2 mL/min (by C-G formula based on SCr of 1.19 mg/dL). Liver Function Tests: Recent Labs  Lab 01/24/19 2147  AST 17  ALT 22  ALKPHOS 44  BILITOT 0.5  PROT 6.3*  ALBUMIN 3.2*   No results for input(s): LIPASE, AMYLASE in the last 168 hours. No results for input(s): AMMONIA in the last 168 hours. Coagulation Profile: Recent Labs  Lab 01/24/19 2147  INR 1.0   Cardiac Enzymes: No results for  input(s): CKTOTAL, CKMB, CKMBINDEX, TROPONINI in the last 168 hours. BNP (last 3 results) No results for input(s): PROBNP in the last 8760 hours. HbA1C: No results for input(s): HGBA1C in the last 72 hours. CBG: No results for input(s): GLUCAP in the last 168 hours. Lipid Profile: No results for input(s): CHOL, HDL, LDLCALC, TRIG, CHOLHDL, LDLDIRECT in the last 72 hours. Thyroid Function Tests: No results for input(s): TSH, T4TOTAL, FREET4, T3FREE, THYROIDAB in the last 72 hours. Anemia Panel: No results for input(s): VITAMINB12, FOLATE, FERRITIN, TIBC, IRON, RETICCTPCT in the last 72 hours.  --------------------------------------------------------------------------------------------------------------- Urine analysis:    Component Value Date/Time   COLORURINE YELLOW 03/10/2015 [place] 03/10/2015 1408   LABSPEC 1.027 03/10/2015 1408   PHURINE 5.0 03/10/2015 1408   GLUCOSEU >1000 (A) 03/10/2015 1408   HGBUR NEGATIVE 03/10/2015 1408   BILIRUBINUR NEGATIVE 03/10/2015 1408   KETONESUR 40 (A) 03/10/2015 1408   PROTEINUR NEGATIVE 03/10/2015 1408   NITRITE NEGATIVE 03/10/2015 1408   LEUKOCYTESUR NEGATIVE 03/10/2015 1408      Imaging Results:    No results found.     Assessment & Plan:    Active Problems:   Rectal bleeding  Rectal bleeding, Anemia STOP Aspirin NPO Ns iv Check cbc this morning at 5am, and another cbc at 12noon Gi consulted by ED, anticipate colonoscopy this am  Dm2 STOP Metformin  STOP Tresiba STOP Jardiance STOP Amaryl fsbs q4h, ISS  Hypertension Cont Olmesartan/Amlodipine/ hydrochlorothiazide  Hyperlipidemia Cont Crestor 10mg  po qhs   DVT Prophylaxis-   SCDs   AM Labs Ordered, also please review Full Orders  Family Communication: Admission, patients condition and plan of care including tests being ordered have been discussed with the patient  who indicate understanding and agree with the plan and Code Status.  Code  Status:  FULL CODE per patient, notified Tonia [place]  Admission status: Observation: Based on patients clinical presentation and evaluation of above clinical data, I have made determination that patient meets observation criteria at this time.   Time spent in minutes :  46 minutes   Jani Gravel M.D on 01/25/2019 at 4:25 AM

## 2019-01-25 NOTE — ED Notes (Signed)
Pt produced 1 bright red stool per rectum. Stand by assist to and from broom. Spoke with pt to ensure he was continuing to drink bowel prep

## 2019-01-25 NOTE — Progress Notes (Addendum)
New Admission Note:  Arrival Method: Wheelchair from ED Mental Orientation:  A&Ox4 Telemetry: Box # 16 Assessment: Completed Skin: Intact IV: PIV T hand with IVF to gravity Pain: 0/10 Tubes: None Safety Measures: Safety Fall Prevention Plan was discussed  Admission: Initiated Belongings: Clothing in bag at bedside Unit Orientation: Patient has been orientated to the room, unit, and the staff.  Orders have been reviewed and implemented. Pt on Golytely prep. Had a bloody BM. Call light has been placed within reach and bedside commode set at [facility]. Will continue to monitor the patient.  Percell Boston, RN

## 2019-01-25 NOTE — ED Provider Notes (Signed)
[facility] EMERGENCY DEPARTMENT Provider Note   CSN: [id number] Arrival date & time: 01/24/19  2053     History Chief Complaint  Patient presents with  . Bloody Stools S/P Colonoscopy    [name] is a 54 y.o. male.  Patient with history of DM, HTN, HLD morbid obesity presents with rectal bleeding that started yesterday (01/24/19). He reports 10+ episodes large volume, BRB per rectum throughout the day, becoming lightheaded into the evening. No syncope. He reports having a colonoscopy ([facility]) on Friday the 15th where they removed multiple polyps. He has been well/asymptomatic, passing normal color brown stool since the procedure until today. No abdominal pain or cramping, no nausea/vomiting.   The history is provided by the patient. No language interpreter was used.       Past Medical History:  Diagnosis Date  . Broken ankle   . Broken arm   . Diabetes mellitus without complication ([place])    type 2  . History of Bell's palsy   . Hyperlipidemia   . Hypertension   . Osteomyelitis [facility])    foot    Patient Active Problem List   Diagnosis Date Noted  . Hypertension   . Diabetes mellitus without complication ([place])   . Influenza A 03/09/2015  . Fever, unspecified 03/08/2015  . Nausea and vomiting 03/08/2015  . Hyperlipidemia 03/04/2015  . Osteomyelitis ([place]) 03/04/2015  . Edema leg 02/26/2015  . Pain of right lower leg 02/26/2015  . 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[place]) 02/19/2015  . Diabetes mellitus ([place]) 02/10/2015  . Foot osteomyelitis, right ([place]) 02/04/2015  . Type II diabetes mellitus ([place]) 07/10/2007  . HYPERTENSION 07/10/2007    Past Surgical History:  Procedure Laterality Date  . AMPUTATION Right 03/04/2015   Procedure: PARTIAL AMPUTATION RIGHT 5TH METATARSAL;  Surgeon: Landis Martins, DPM;  Location: [facility];  Service: Podiatry;  Laterality: Right;  . APPENDECTOMY    . COLONOSCOPY  11/17/2005   TAs - [facility]  . EYE  SURGERY Bilateral    blood vessels -cautery  . I & D EXTREMITY Right 03/04/2015   Procedure: IRRIGATION AND DEBRIDEMENT RIGHT FOOT;  Surgeon: Landis Martins, DPM;  Location: [facility];  Service: Podiatry;  Laterality: Right;  . WISDOM TOOTH EXTRACTION     only 2 ext       Family History  Problem Relation Age of Onset  . Diabetes Father   . Colon cancer Father   . Colon polyps Father   . Esophageal cancer Neg Hx   . Rectal cancer Neg Hx   . Stomach cancer Neg Hx     Social History   Tobacco Use  . Smoking status: Never Smoker  . Smokeless tobacco: Never Used  Substance Use Topics  . Alcohol use: No  . Drug use: No    Home Medications Prior to Admission medications   Medication Sig Start Date End Date Taking? Authorizing Provider  aspirin EC 81 MG tablet Take 81 mg by mouth daily.   Yes [provider]  empagliflozin (JARDIANCE) 25 MG TABS tablet Take 25 mg by mouth daily.   Yes [provider]  fluticasone (FLONASE ALLERGY RELIEF) 50 MCG/ACT nasal spray Place 2 sprays into both nostrils daily as needed for allergies or rhinitis.   Yes [provider]  glimepiride (AMARYL) 2 MG tablet Take 2 mg by mouth 2 (two) times daily. 10/26/14  Yes [provider]  Insulin Degludec (TRESIBA FLEXTOUCH) 200 UNIT/ML SOPN Inject 100 Units into  the skin daily before breakfast.    Yes [provider]  metFORMIN (GLUCOPHAGE) 1000 MG tablet Take 1,000 mg by mouth 2 (two) times daily. 11/09/14  Yes [provider]  Multiple Vitamin (MULTIVITAMIN) tablet Take 1 tablet by mouth daily.   Yes [provider]  Olmesartan-Amlodipine-HCTZ 40-10-25 MG TABS Take 1 tablet by mouth daily. Reported on 02/04/2015 12/28/14  Yes [provider]  omega-3 acid ethyl esters (LOVAZA) 1 g capsule Take 1 capsule by mouth 2 (two) times daily.   Yes [provider]  rosuvastatin (CRESTOR) 10 MG tablet Take 10 mg by mouth daily. 01/03/19  Yes  [provider]    Allergies    Patient has no known allergies.  Review of Systems   Review of Systems  Constitutional: Positive for diaphoresis and fatigue. Negative for chills and fever.  HENT: Negative.   Respiratory: Negative.  Negative for shortness of breath.   Cardiovascular: Negative.  Negative for chest pain.  Gastrointestinal: Positive for blood in stool. Negative for abdominal pain and vomiting.  Musculoskeletal: Negative.   Skin: Negative.   Neurological: Positive for weakness and light-headedness. Negative for syncope.    Physical Exam Updated Vital Signs BP 122/71   Pulse 89   Temp (!) 97.5 F (36.4 C) (Oral)   Resp 16   SpO2 95%   Physical Exam Vitals and nursing note reviewed.  Constitutional:      Appearance: He is well-developed.  HENT:     Head: Normocephalic.  Eyes:     Conjunctiva/sclera: Conjunctivae normal.  Cardiovascular:     Rate and Rhythm: Normal rate and regular rhythm.     Heart sounds: No murmur.  Pulmonary:     Effort: Pulmonary effort is normal.     Breath sounds: Normal breath sounds. No wheezing, rhonchi or rales.  Abdominal:     General: Bowel sounds are normal.     Palpations: Abdomen is soft.     Tenderness: There is no abdominal tenderness. There is no guarding or rebound.     Comments: Obese  Musculoskeletal:        General: Normal range of motion.     Cervical back: Normal range of motion and neck supple.  Skin:    General: Skin is warm and dry.     Coloration: Skin is pale.     Findings: No rash.  Neurological:     Mental Status: He is alert and oriented to person, place, and time.     ED Results / Procedures / Treatments   Labs (all labs ordered are listed, but only abnormal results are displayed) Labs Reviewed  CBC WITH DIFFERENTIAL/PLATELET - Abnormal; Notable for the following components:      Result Value   WBC 12.3 (*)    Hemoglobin 12.4 (*)    Neutro Abs 8.4 (*)    All other components within  normal limits  COMPREHENSIVE METABOLIC PANEL - Abnormal; Notable for the following components:   Glucose, Bld 253 (*)    BUN 28 (*)    Calcium 8.5 (*)    Total Protein 6.3 (*)    Albumin 3.2 (*)    All other components within normal limits  HEMOGLOBIN AND HEMATOCRIT, BLOOD - Abnormal; Notable for the following components:   Hemoglobin 11.5 (*)    HCT 36.0 (*)    All other components within normal limits  RESPIRATORY PANEL BY RT PCR (FLU A&B, COVID)  PROTIME-INR  CBC  SAMPLE TO BLOOD BANK  TYPE AND SCREEN  ABO/RH    EKG None  Radiology No results found.  Procedures Procedures (including critical care time)  Medications Ordered in ED Medications  sodium chloride 0.9 % bolus 1,000 mL (has no administration in time range)  0.9 %  sodium chloride infusion (has no administration in time range)  sodium chloride 0.9 % bolus 1,000 mL (0 mLs Intravenous Stopped 01/25/19 0241)    ED Course  I have reviewed the triage vital signs and the nursing notes.  Pertinent labs & imaging results that were available during my care of the patient were reviewed by me and considered in my medical decision making (see chart for details).    MDM Rules/Calculators/A&P                      Patient to ED with rectal bleeding as detailed in the HPI.  He appears fatigued. No SOB/dyspnea. BP normal on arrival, 109/67 on last check. He is overall pale in appearance, nondiaphoretic currently, with normal appearing conjunctiva.   Orthostatics produce significant presyncopal symptoms including diaphoresis, lightheadedness, unsteadiness. No full syncope.   Labs pending. At 9:30 his hgb was 12.4. Repeating an H&H, giving fluids now. Type and cross/coags added. Anticipate colonoscopy to determine where bleeding originates - will obtain 2-hour COVID. Patient stable for now. Will continue to observe. Will consult GI when H&H updated.   Hgb down q gram in 4 hours. Patient significantly symptomatic when  standing for orthostatic blood pressures, better when lying down. Gastro paged.   2:45 - Talked with Juanita Craver, GI, who will plan for scope in the am. Prep to be started in the ED. IV fluids have been running to treat orthostatic hypotension. He will be watched closely with assistance in transfers to the bedside commode to avoid injury.   3:10 - Spoke to Dr. Maudie Mercury, [facility], who accepts the patient for admission to his service.   Patient updated on plan of care and admission.  Final Clinical Impression(s) / ED Diagnoses Final diagnoses:  None   1. Lower GI bleeding.  Rx / DC Orders ED Discharge Orders    None       Charlann Lange, Hershal Coria 01/25/19 [id number]    Mesner, Corene Cornea, MD 01/25/19 [phone]

## 2019-01-25 NOTE — Op Note (Signed)
[facility] Patient Name: [name] Procedure Date : 01/25/2019 MRN: [id number] Attending MD: Juanita Craver , MD Date of Birth: Dec 05, 1965 CSN: [id number] Age: 54 Admit Type: Inpatient Procedure:                Colonoscopy with control of bleeding. Indications:              Rectal bleeding s/p multiple polypectomies on                            01/17/2019; IDA, Family history of colon                            cancer-father; CRC screening for colorectal                            malignant neoplasm. Providers:                Juanita Craver, MD, Josie Dixon, RN, Janeece Agee,                            Technician, Clearnce Sorrel, CRNA, Duane Boston, MD Referring MD:             Carlota Raspberry. Havery Moros, MD Medicines:                Monitored Anesthesia Care. Complications:            No immediate complications. Estimated Blood Loss:     Estimated blood loss was minimal. Procedure:                Pre-Anesthesia Assessment: - Prior to the                            procedure, a history and physical was performed,                            and patient medications and allergies were                            reviewed. The patient's tolerance of previous                            anesthesia was also reviewed. The risks and                            benefits of the procedure and the sedation options                            and risks were discussed with the patient. All                            questions were answered, and informed consent was                            obtained. Prior Anticoagulants: The patient has  taken no previous anticoagulant or antiplatelet                            agents except for aspirin. ASA Grade Assessment: II                            - A patient with mild systemic disease. After                            reviewing the risks and benefits, the patient was                            deemed in satisfactory  condition to undergo the                            procedure. After obtaining informed consent, the                            colonoscope was passed under direct vision.                            Throughout the procedure, the patient's blood                            pressure, pulse, and oxygen saturations were                            monitored continuously. The CF-[id number] ([id number])                            Olympus colonoscope was introduced through the anus                            and advanced to the the ileocecal valve. The                            colonoscopy was performed with moderate difficulty                            due to inadequate bowel prep. Completion of the                            procedure was aided by lavage. The patient                            tolerated the procedure well. The quality of the                            bowel preparation was poor. The ileocecal valve and                            the rectum were photographed. Scope In: 12:28:43 PM Scope Out: 76:73:41 PM Scope Withdrawal Time: 0 hours 7 minutes 12 seconds  Total Procedure Duration: 0 hours 17 minutes 30 seconds  Findings:      A large post-polypectomy ulcer was noted in the proximal ascending colon       with a pigmented spot at its base; two hemoclips were successfully       placed (MR unsafe).      There were several ulcerated areas noted in the colon corresponding to       the sites of the recent polypectomies. However, complete visualization       of the entire colonic mucosa was not possible due to a poor prep; the       cecal base was not visualized,      No additional abnormalities were found on retroflexion-this was a       limited exam due to a lot of debris in the rectum. Impression:               - Preparation of the colon was poor.                           - Mucosal ulceration. Clips (MR unsafe) were placed.                           - No specimens collected. Moderate  Sedation:      MAC used. Recommendation:           - Clear liquid diet today.                           - Continue present medications except for Aspirin.                           - No Aspirin, Ibuprofen, Naproxen, or other                            non-steroidal anti-inflammatory drugs for 10 days. Procedure Code(s):        --- Professional ---                           [phone], Colonoscopy, flexible; with control of                            bleeding, any method Diagnosis Code(s):        --- Professional ---                           K62.5, Hemorrhage of anus and rectum                           D50.9, Iron deficiency anemia, unspecified                           Z80.0, Family history of malignant neoplasm of                            digestive organs                           Z12.11, Encounter for screening for malignant  neoplasm of colon                           K63.3, Ulcer of intestine CPT copyright 2019 [employer]. All rights reserved. The codes documented in this report are preliminary and upon coder review may  be revised to meet current compliance requirements. Juanita Craver, MD Juanita Craver, MD 01/25/2019 1:07:01 PM This report has been signed electronically. Number of Addenda: 0

## 2019-01-25 NOTE — Anesthesia Postprocedure Evaluation (Signed)
Anesthesia Post Note  Patient: [name]  Procedure(s) Performed: COLONOSCOPY (N/A ) HEMOSTASIS CLIP PLACEMENT     Patient location during evaluation: PACU Anesthesia Type: MAC Level of consciousness: awake and alert Pain management: pain level controlled Vital Signs Assessment: post-procedure vital signs reviewed and stable Respiratory status: spontaneous breathing and respiratory function stable Cardiovascular status: stable Postop Assessment: no apparent nausea or vomiting Anesthetic complications: no    Last Vitals:  Vitals:   01/25/19 1313 01/25/19 1341  BP: (!) 100/50 123/60  Pulse: 82 80  Resp: 18 18  Temp:    SpO2:  100%    Last Pain:  Vitals:   01/25/19 1341  TempSrc:   PainSc: 0-No pain                 [name] DANIEL

## 2019-01-26 LAB — BASIC METABOLIC PANEL
Anion gap: 7 (ref 5–15)
BUN: 20 mg/dL (ref 6–20)
CO2: 27 mmol/L (ref 22–32)
Calcium: 7.8 mg/dL — ABNORMAL LOW (ref 8.9–10.3)
Chloride: 104 mmol/L (ref 98–111)
Creatinine, Ser: 1.03 mg/dL (ref 0.61–1.24)
GFR calc Af Amer: >60 mL/min (ref >60)
GFR calc non Af Amer: >60 mL/min (ref >60)
Glucose, Bld: 157 mg/dL — ABNORMAL HIGH (ref 70–99)
Potassium: 3.8 mmol/L (ref 3.5–5.1)
Sodium: 138 mmol/L (ref 135–145)

## 2019-01-26 LAB — CBC
HCT: 24.3 % — ABNORMAL LOW (ref 39.0–52.0)
Hemoglobin: 7.9 g/dL — ABNORMAL LOW (ref 13.0–17.0)
MCH: 28.2 pg (ref 26.0–34.0)
MCHC: 32.5 g/dL (ref 30.0–36.0)
MCV: 86.8 fL (ref 80.0–100.0)
Platelets: 222 10*3/uL (ref 150–400)
RBC: 2.8 MIL/uL — ABNORMAL LOW (ref 4.22–5.81)
RDW: 13.8 % (ref 11.5–15.5)
WBC: 7.7 10*3/uL (ref 4.0–10.5)
nRBC: 0 % (ref 0.0–0.2)

## 2019-01-26 LAB — HEMOGLOBIN AND HEMATOCRIT, BLOOD
HCT: 24.5 % — ABNORMAL LOW (ref 39.0–52.0)
Hemoglobin: 8.1 g/dL — ABNORMAL LOW (ref 13.0–17.0)

## 2019-01-26 LAB — GLUCOSE, CAPILLARY
Glucose-Capillary: 146 mg/dL — ABNORMAL HIGH (ref 70–99)
Glucose-Capillary: 149 mg/dL — ABNORMAL HIGH (ref 70–99)
Glucose-Capillary: 175 mg/dL — ABNORMAL HIGH (ref 70–99)
Glucose-Capillary: 206 mg/dL — ABNORMAL HIGH (ref 70–99)

## 2019-01-26 NOTE — Progress Notes (Signed)
[facility] cover [facility] Subjective: Since I last evaluated the patient, he seems to be doing fairly well.  He denies any further problems with rectal bleeding.  He had a post polypectomy bleed from a procedure done on 01/17/2019 when a large ulcer was found in the proximal right colon and 2 hemoclips were applied.  Multiple ulcerations were noted in the colon from all the polypectomies he had about a week ago.  Patient claims he feels well this morning and denies having any nausea vomiting or abdominal pain. He has had drop in his hemoglobin to 7.9 g/dL this morning  Objective: Vital signs in last 24 hours: Temp:  [97.7 F (36.5 C)-99 F (37.2 C)] 98.3 F (36.8 C) (01/24 0424) Pulse Rate:  [75-93] 75 (01/24 0424) Resp:  [18-20] 18 (01/24 0424) BP: (95-134)/(47-72) 134/72 (01/24 0424) SpO2:  [97 %-100 %] 98 % (01/24 0424) Weight:  [138.8 kg] 138.8 kg (01/23 2008) Last BM Date: 01/25/19  Intake/Output from previous day: 01/23 0701 - 01/24 0700 In: 1640 [P.O.:1390; I.V.:250] Out: 250 [Urine:250] Intake/Output this shift: No intake/output data recorded.  General appearance: alert, cooperative, appears stated age, no distress, morbidly obese and pale Resp: clear to auscultation bilaterally Cardio: regular rate and rhythm, S1, S2 normal, no murmur, click, rub or gallop GI: soft, morbidly obese, non-tender; bowel sounds normal; no masses,  no organomegaly Extremities: extremities normal, atraumatic, no cyanosis or edema  Lab Results: Recent Labs    01/25/19 0406 01/25/19 0406 01/25/19 1332 01/25/19 1656 01/26/19 0536  WBC 14.2*  --  12.4*  --  7.7  HGB 10.5*   < > 8.7* 8.3* 7.9*  HCT 32.7*   < > 26.4* 25.3* 24.3*  PLT 285  --  260  --  222   < > = values in this interval not displayed.   BMET Recent Labs    01/24/19 2147 01/25/19 0406 01/26/19 0536  NA 135 138 138  K 4.6 4.4 3.8  CL 98 103 104  CO2 25 23 27   GLUCOSE 253* 245* 157*  BUN 28* 34* 20  CREATININE 1.19 1.18 1.03   CALCIUM 8.5* 7.7* 7.8*   LFT Recent Labs    01/25/19 0406  PROT 5.3*  ALBUMIN 2.9*  AST 18  ALT 21  ALKPHOS 40  BILITOT 0.2*   PT/INR Recent Labs    01/24/19 2147  LABPROT 13.3  INR 1.0   Medications: I have reviewed the patient's current medications.  Assessment/Plan: 1) Acute blood loss anemia status post polypectomy bleed from a recent procedure on 17 January 2019. Large ulceration was noted in the proximal right colon with a pigmented spot and 2 hemoclips were applied.  Patient seems to be doing well after the procedure and has had no further rectal bleeding.  I think he is stable to go home this afternoon with plans with for close follow-up with Dr. Havery Moros within the next 7 to 10 days. He has strongly been advised against the use of all nonsteroidals including aspirin for now. If he has any further rectal bleeding he should contact the office immediately. 2) Family history of colon cancer in his father and a personal history of tubular adenomas and a colonoscopy done in 2017 and recent colonoscopy as mentioned above. Patient is being followed closely by Dr. 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 4) Hypertension/Hyperlipidemia.   LOS: 1 day   Juanita Craver 01/26/2019, 8:16 AM

## 2019-01-26 NOTE — Discharge Instructions (Signed)
Rectal Bleeding  Rectal bleeding is when blood comes out of the opening of the butt (anus). People with this kind of bleeding may notice bright red blood in their underwear or in the toilet after they poop (have a bowel movement). They may also have dark red or black poop (stool). Rectal bleeding is often a sign that something is wrong. It needs to be checked by a doctor. Follow these instructions at home: Watch for any changes in your condition. Take these actions to help with bleeding and discomfort:  Eat a diet that is high in fiber. This will keep your poop soft so it is easier for you to poop without pushing too hard. Ask your doctor to tell you what foods and drinks are high in fiber.  Drink enough fluid to keep your pee (urine) clear or pale yellow. This also helps keep your poop soft.  Try taking a warm bath. This may help with pain.  Keep all follow-up visits as told by your doctor. This is important. Get help right away if:  You have new bleeding.  You have more bleeding than before.  You have black or dark red poop.  You throw up (vomit) blood or something that looks like coffee grounds.  You have pain or tenderness in your belly (abdomen).  You have a fever.  You feel weak.  You feel sick to your stomach (nauseous).  You pass out (faint).  You have very bad pain in your butt.  You cannot poop. This information is not intended to replace advice given to you by your health care provider. Make sure you discuss any questions you have with your health care provider. Document Revised: 12/01/2016 Document Reviewed: 02/14/2015 Elsevier Patient Education  2020 [place] Gastrointestinal Bleeding  Lower gastrointestinal (GI) bleeding is the result of bleeding from the colon, rectum, or anal area. The colon is the last part of the digestive tract, where stool, also called feces, is formed. If you have lower GI bleeding, you may see blood in or on your stool.  It may be bright red. Lower GI bleeding often stops without treatment. Continued or heavy bleeding needs emergency treatment at the hospital. What are the causes? Lower GI bleeding may be caused by:  A condition that causes pouches to form in the colon over time (diverticulosis).  Swelling and irritation (inflammation) in areas with diverticulosis (diverticulitis).  Inflammation of the colon (inflammatory bowel disease).  Swollen veins in the rectum (hemorrhoids).  Painful tears in the anus (anal fissures), often caused by passing hard stools.  Cancer of the colon or rectum.  Noncancerous growths (polyps) of the colon or rectum.  A bleeding disorder that impairs the formation of blood clots and causes easy bleeding (coagulopathy).  An abnormal weakening of a blood vessel where an artery and a vein come together (arteriovenous malformation). What increases the risk? You are more likely to develop this condition if:  You are older than 53 years of age.  You take aspirin or NSAIDs on a regular basis.  You take anticoagulant or antiplatelet drugs.  You have a history of high-dose X-ray treatment (radiation therapy) of the colon.  You recently had a colon polyp removed. What are the signs or symptoms? Symptoms of this condition include:  Bright red blood or blood clots coming from your rectum.  Bloody stools.  Black or maroon-colored stools.  Pain or cramping in the abdomen.  Weakness or dizziness.  Racing heartbeat. How is this  diagnosed? This condition may be diagnosed based on:  Your symptoms and medical history.  A physical exam. During the exam, your health care provider will check for signs of blood loss, such as low blood pressure and a rapid pulse.  Tests, such as: ? Flexible sigmoidoscopy. In this procedure, a flexible tube with a camera on the end is used to examine your anus and the first part of your colon to look for the source of  bleeding. ? Colonoscopy. This is similar to a flexible sigmoidoscopy, but the camera can extend all the way to the uppermost part of your colon. ? Blood tests to measure your red blood cell count and to check for coagulopathy. ? An imaging study of your colon to look for a bleeding site. In some cases, you may have X-rays taken after a dye or radioactive substance is injected into your bloodstream (angiogram). How is this treated? Treatment for this condition depends on the cause of the bleeding. Heavy or persistent bleeding is treated at the hospital. Treatment may include:  Getting fluids through an IV tube inserted into one of your veins.  Getting blood through an IV tube (blood transfusion).  Stopping bleeding through high-heat coagulation, injections of certain medicines, or applying surgical clips. This can all be done during a colonoscopy.  Having a procedure that involves first doing an angiogram and then blocking blood flow to the bleeding site (embolization).  Stopping some of your regular medicines for a certain amount of time.  Having surgery to remove part of the colon. This may be needed if bleeding is severe and does not respond to other treatment. Follow these instructions at home:  Take over-the-counter and prescription medicines only as told by your health care provider. You may need to avoid aspirin, NSAIDs, or other medicines that increase bleeding.  Eat foods that are high in fiber. This will help keep your stools soft. These foods include whole grains, legumes, fruits, and vegetables. Eating 1-3 prunes each day works well for many people.  Drink enough fluid to keep your urine clear or pale yellow.  Keep all follow-up visits as told by your health care provider. This is important. Contact a health care provider if:  Your symptoms do not improve. Get help right away if:  Your bleeding increases.  You feel light-headed or you faint.  You feel weak.  You have  severe cramps in your back or abdomen.  You pass large blood clots in your stool.  Your symptoms get worse. This information is not intended to replace advice given to you by your health care provider. Make sure you discuss any questions you have with your health care provider. Document Revised: 04/12/2018 Document Reviewed: 05/06/2015 Elsevier Patient Education  [place].

## 2019-01-26 NOTE — Discharge Summary (Signed)
Physician Discharge Summary  [name] [id number] DOB: 1965/04/09 DOA: 01/24/2019  PCP: [name], MD  Admit date: 01/24/2019 Discharge date: 01/26/2019   Code Status: Full Code  Admitted From: Home Discharged to: [place]: None Equipment/Devices: None Discharge Condition: Improving  Recommendations for Outpatient Follow-up   1. Follow up with GI in 1 week 2. Please follow up CBC  3. Aspirin currently on hold due to GI bleed, reassess outpatient  Hospital Summary  This is a 54 year old male who has a history of hypertension, hyperlipidemia, type 2 diabetes, family history of colon cancer who recently underwent colonoscopy 01/17/2019 with polypectomy and presented with complaints of bright red blood per rectum secondary to post polypectomy bleed.  He underwent repeat colonoscopy 1/23 by Dr. Collene Mares with a large ulcer found in the proximal right colon and had to hemoclips placed.  Hemoglobin dropped slightly more since procedure to 7.9 from 8.3.  Per GI, patient stable to be discharged home with plan for close follow-up with Dr. Havery Moros in 7 to 10 days and strongly advised to hold all NSAIDs including aspirin for now until reassessed.  A & P   Principal Problem:   Rectal bleeding Active Problems:   Type II diabetes mellitus (HCC)   Hyperlipidemia   Hypertension   GI bleed  1. Acute blood loss anemia secondary to recent colonoscopy/polypectomy (01/17/2019) with family history of colon cancer in father and personal history of tubular adenoma 1. Did not require blood transfusion during hospitalization 2. Repeat colonoscopy 1/23 with large ulcer in the proximal right colon, 2 hemoclips applied 3. Slight drop in hemoglobin overnight 8.3-7.9 hemodynamically stable on room air and asymptomatic 4. Advised to follow-up with Dr. Havery Moros next 7 to 10 days avoid all NSAIDs including aspirin for now 5. Follow-up CBC outpatient 2. Type 2 diabetes 1. Home meds held  during hospitalization and was on sliding scale 2. Restart home meds at discharge 3. Hypertension, stable 1. Continue home medications 4. Hyperlipidemia on Crestor 5. 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 GI  Procedures  . Colonoscopy with 2 hemoclips placed 01/25/2019  Antibiotics   Anti-infectives (From admission, onward)   None        Subjective  Seen and examined at bedside no acute distress resting comfortably.  States he did have some more bright red blood yesterday evening per rectum however much improved from prior.  Currently asymptomatic.  States he has been ambulating well without any recurrence of symptoms.  Tolerating diet and anticipating discharge.  Denies any abdominal pain, nausea, vomiting, diarrhea or constipation.  Denies any other complaints at this time.   Objective   Discharge Exam: Vitals:   01/26/19 0424 01/26/19 0904  BP: 134/72 124/70  Pulse: 75 72  Resp: 18 18  Temp: 98.3 F (36.8 C) 98.4 F (36.9 C)  SpO2: 98% 98%   Vitals:   01/25/19 1743 01/25/19 2008 01/26/19 0424 01/26/19 0904  BP: 118/60 131/64 134/72 124/70  Pulse: 80 76 75 72  Resp: 18 18 18 18   Temp: 98.9 F (37.2 C) 98.7 F (37.1 C) 98.3 F (36.8 C) 98.4 F (36.9 C)  TempSrc: Oral   Oral  SpO2: 100% 97% 98% 98%  Weight:  (!) 138.8 kg    Height:        Physical Exam Vitals and nursing note reviewed.  Constitutional:      Appearance: Normal appearance. He is obese.  HENT:  Head: Normocephalic.  Eyes:     Conjunctiva/sclera: Conjunctivae normal.  Cardiovascular:     Rate and Rhythm: Normal rate and regular rhythm.  Pulmonary:     Effort: Pulmonary effort is normal.     Breath sounds: Normal breath sounds.  Abdominal:     General: Abdomen is flat. There is no distension.     Tenderness: There is no abdominal tenderness.  Musculoskeletal:        General: No swelling or tenderness.  Skin:    General: Skin is  warm.     Coloration: Skin is pale.  Neurological:     Mental Status: He is alert. Mental status is at baseline.  Psychiatric:        Mood and Affect: Mood normal.        Behavior: Behavior normal.       The results of significant diagnostics from this hospitalization (including imaging, microbiology, ancillary and laboratory) are listed below for reference.     Microbiology: Recent Results (from the past 240 hour(s))  Respiratory Panel by RT PCR (Flu A&B, Covid) - Nasopharyngeal Swab     Status: None   Collection Time: 01/25/19  3:27 AM   Specimen: Nasopharyngeal Swab  Result Value Ref Range Status   SARS Coronavirus 2 by RT PCR NEGATIVE NEGATIVE Final    Comment: (NOTE) SARS-CoV-2 target nucleic acids are NOT DETECTED. The SARS-CoV-2 RNA is generally detectable in upper respiratoy specimens during the acute phase of infection. The lowest concentration of SARS-CoV-2 viral copies this assay can detect is 131 copies/mL. A negative result does not preclude SARS-Cov-2 infection and should not be used as the sole basis for treatment or other patient management decisions. A negative result may occur with  improper specimen collection/handling, submission of specimen other than nasopharyngeal swab, presence of viral mutation(s) within the areas targeted by this assay, and inadequate number of viral copies (<131 copies/mL). A negative result must be combined with clinical observations, patient history, and epidemiological information. The expected result is Negative. Fact Sheet for Patients:  PinkCheek.be Fact Sheet for Healthcare Providers:  GravelBags.it This test is not yet ap proved or cleared by the [place] FDA and  has been authorized for detection and/or diagnosis of SARS-CoV-2 by FDA under an Emergency Use Authorization (EUA). This EUA will remain  in effect (meaning this test can be used) for the duration of  the COVID-19 declaration under Section 564(b)(1) of the Act, 21 U.S.C. section 360bbb-3(b)(1), unless the authorization is terminated or revoked sooner.    Influenza A by PCR NEGATIVE NEGATIVE Final   Influenza B by PCR NEGATIVE NEGATIVE Final    Comment: (NOTE) The Xpert Xpress SARS-CoV-2/FLU/RSV assay is intended as an aid in  the diagnosis of influenza from Nasopharyngeal swab specimens and  should not be used as a sole basis for treatment. Nasal washings and  aspirates are unacceptable for Xpert Xpress SARS-CoV-2/FLU/RSV  testing. Fact Sheet for Patients: PinkCheek.be Fact Sheet for Healthcare Providers: GravelBags.it This test is not yet approved or cleared by the [place] FDA and  has been authorized for detection and/or diagnosis of SARS-CoV-2 by  FDA under an Emergency Use Authorization (EUA). This EUA will remain  in effect (meaning this test can be used) for the duration of the  Covid-19 declaration under Section 564(b)(1) of the Act, 21  U.S.C. section 360bbb-3(b)(1), unless the authorization is  terminated or revoked. Performed at [facility], [place] [address], [place], [place] [address]  Labs: BNP (last 3 results) No results for input(s): BNP in the last 8760 hours. Basic Metabolic Panel: Recent Labs  Lab 01/24/19 2147 01/25/19 0406 01/26/19 0536  NA 135 138 138  K 4.6 4.4 3.8  CL 98 103 104  CO2 25 23 27   GLUCOSE 253* 245* 157*  BUN 28* 34* 20  CREATININE 1.19 1.18 1.03  CALCIUM 8.5* 7.7* 7.8*   Liver Function Tests: Recent Labs  Lab 01/24/19 2147 01/25/19 0406  AST 17 18  ALT 22 21  ALKPHOS 44 40  BILITOT 0.5 0.2*  PROT 6.3* 5.3*  ALBUMIN 3.2* 2.9*   No results for input(s): LIPASE, AMYLASE in the last 168 hours. No results for input(s): AMMONIA in the last 168 hours. CBC: Recent Labs  Lab 01/24/19 2147 01/25/19 0116 01/25/19 0406 01/25/19 1332 01/25/19 1656  01/26/19 0536 01/26/19 1340  WBC 12.3*  --  14.2* 12.4*  --  7.7  --   NEUTROABS 8.4*  --   --   --   --   --   --   HGB 12.4*   < > 10.5* 8.7* 8.3* 7.9* 8.1*  HCT 39.3   < > 32.7* 26.4* 25.3* 24.3* 24.5*  MCV 87.7  --  87.4 86.3  --  86.8  --   PLT 330  --  285 260  --  222  --    < > = values in this interval not displayed.   Cardiac Enzymes: No results for input(s): CKTOTAL, CKMB, CKMBINDEX, TROPONINI in the last 168 hours. BNP: Invalid input(s): POCBNP CBG: Recent Labs  Lab 01/25/19 2009 01/26/19 0009 01/26/19 0422 01/26/19 0722 01/26/19 1136  GLUCAP 245* 175* 149* 146* 206*   D-Dimer No results for input(s): DDIMER in the last 72 hours. Hgb A1c Recent Labs    01/25/19 0406  HGBA1C 8.0*   Lipid Profile No results for input(s): CHOL, HDL, LDLCALC, TRIG, CHOLHDL, LDLDIRECT in the last 72 hours. Thyroid function studies No results for input(s): TSH, T4TOTAL, T3FREE, THYROIDAB in the last 72 hours.  Invalid input(s): FREET3 Anemia work up No results for input(s): VITAMINB12, FOLATE, FERRITIN, TIBC, IRON, RETICCTPCT in the last 72 hours. Urinalysis    Component Value Date/Time   COLORURINE YELLOW 03/10/2015 1408   APPEARANCEUR CLEAR 03/10/2015 1408   LABSPEC 1.027 03/10/2015 1408   PHURINE 5.0 03/10/2015 1408   GLUCOSEU >1000 (A) 03/10/2015 1408   HGBUR NEGATIVE 03/10/2015 1408   BILIRUBINUR NEGATIVE 03/10/2015 1408   KETONESUR 40 (A) 03/10/2015 1408   PROTEINUR NEGATIVE 03/10/2015 1408   NITRITE NEGATIVE 03/10/2015 1408   LEUKOCYTESUR NEGATIVE 03/10/2015 1408   Sepsis Labs Invalid input(s): PROCALCITONIN,  WBC,  LACTICIDVEN Microbiology Recent Results (from the past 240 hour(s))  Respiratory Panel by RT PCR (Flu A&B, Covid) - Nasopharyngeal Swab     Status: None   Collection Time: 01/25/19  3:27 AM   Specimen: Nasopharyngeal Swab  Result Value Ref Range Status   SARS Coronavirus 2 by RT PCR NEGATIVE NEGATIVE Final    Comment: (NOTE) SARS-CoV-2 target  nucleic acids are NOT DETECTED. The SARS-CoV-2 RNA is generally detectable in upper respiratoy specimens during the acute phase of infection. The lowest concentration of SARS-CoV-2 viral copies this assay can detect is 131 copies/mL. A negative result does not preclude SARS-Cov-2 infection and should not be used as the sole basis for treatment or other patient management decisions. A negative result may occur with  improper specimen collection/handling, submission of specimen other than nasopharyngeal swab,  presence of viral mutation(s) within the areas targeted by this assay, and inadequate number of viral copies (<131 copies/mL). A negative result must be combined with clinical observations, patient history, and epidemiological information. The expected result is Negative. Fact Sheet for Patients:  PinkCheek.be Fact Sheet for Healthcare Providers:  GravelBags.it This test is not yet ap proved or cleared by the [place] FDA and  has been authorized for detection and/or diagnosis of SARS-CoV-2 by FDA under an Emergency Use Authorization (EUA). This EUA will remain  in effect (meaning this test can be used) for the duration of the COVID-19 declaration under Section 564(b)(1) of the Act, 21 U.S.C. section 360bbb-3(b)(1), unless the authorization is terminated or revoked sooner.    Influenza A by PCR NEGATIVE NEGATIVE Final   Influenza B by PCR NEGATIVE NEGATIVE Final    Comment: (NOTE) The Xpert Xpress SARS-CoV-2/FLU/RSV assay is intended as an aid in  the diagnosis of influenza from Nasopharyngeal swab specimens and  should not be used as a sole basis for treatment. Nasal washings and  aspirates are unacceptable for Xpert Xpress SARS-CoV-2/FLU/RSV  testing. Fact Sheet for Patients: PinkCheek.be Fact Sheet for Healthcare Providers: GravelBags.it This test is not  yet approved or cleared by the [place] FDA and  has been authorized for detection and/or diagnosis of SARS-CoV-2 by  FDA under an Emergency Use Authorization (EUA). This EUA will remain  in effect (meaning this test can be used) for the duration of the  Covid-19 declaration under Section 564(b)(1) of the Act, 21  U.S.C. section 360bbb-3(b)(1), unless the authorization is  terminated or revoked. Performed at [facility], [place] [address], [place], [place] [address]     Discharge Instructions     Discharge Instructions    Diet - low sodium heart healthy   Complete by: As directed    Discharge instructions   Complete by: As directed    You were seen and examined in the hospital for GI bleed and cared for by a hospitalist and gastroenterologist  Upon Discharge:  - Do not take Aspirin, Ibuprofen, Naproxen, Motrin, Advil, Aleve until you discuss with a physician at your next appointment - get follow up labs this week to make sure your blood level has improved - Schedule an appointment with Dr. Havery Moros in the next 7 to 10 days  Bring all home medications to your appointment to review Request that your primary physician go over all hospital tests and procedures/radiological results at the follow up.   Please get all hospital records sent to your physician by signing a hospital release before you go home.     Read the complete instructions along with all the possible side effects for all the medicines you take and that have been prescribed to you. Take any new medicines after you have completely understood and accept all the possible adverse reactions/side effects.   If you have any questions about your discharge medications or the care you received while you were in the hospital, you can call the unit and asked to speak with the hospitalist on call. Once you are discharged, your primary care physician will handle any further medical issues. Please note that NO REFILLS for  any discharge medications will be authorized, as it is imperative that you return to your primary care physician (or establish a relationship with a primary care physician if you do not have one) for your aftercare needs so that they can reassess your need for medications and monitor your  lab values.   Do not drive, operate heavy machinery, perform activities at heights, swimming or participation in water activities or provide baby sitting services if your were admitted for loss of consciousness/seizures or if you are on sedating medications including, but not limited to benzodiazepines, sleep medications, narcotic pain medications, etc., until you have been cleared to do so by a medical doctor.   Do not take more than prescribed medications.   Wear a seat belt while driving.  If you have smoked or chewed Tobacco in the last 2 years please stop smoking; also stop any regular Alcohol and/or any Recreational drug use including marijuana.  If you experience worsening of your admission symptoms or develop shortness of breath, chest pain, suicidal or homicidal thoughts or experience a life threatening emergency, you must seek medical attention immediately by calling 911 or calling your PCP immediately.   Increase activity slowly   Complete by: As directed      Allergies as of 01/26/2019   No Known Allergies     Medication List    STOP taking these medications   aspirin EC 81 MG tablet     TAKE these medications   Flonase Allergy Relief 50 MCG/ACT nasal spray Generic drug: fluticasone Place 2 sprays into both nostrils daily as needed for allergies or rhinitis.   glimepiride 2 MG tablet Commonly known as: AMARYL Take 2 mg by mouth 2 (two) times daily.   Jardiance 25 MG Tabs tablet Generic drug: empagliflozin Take 25 mg by mouth daily.   metFORMIN 1000 MG tablet Commonly known as: GLUCOPHAGE Take 1,000 mg by mouth 2 (two) times daily.   multivitamin tablet Take 1 tablet by mouth  daily.   Olmesartan-amLODIPine-HCTZ 40-10-25 MG Tabs Take 1 tablet by mouth daily. Reported on 02/04/2015   omega-3 acid ethyl esters 1 g capsule Commonly known as: LOVAZA Take 1 capsule by mouth 2 (two) times daily.   rosuvastatin 10 MG tablet Commonly known as: CRESTOR Take 10 mg by mouth daily.   Tyler Aas FlexTouch 200 UNIT/ML Sopn Generic drug: Insulin Degludec Inject 100 Units into the skin daily before breakfast.       No Known Allergies  Time coordinating discharge: Over 30 minutes   SIGNED:   Harold Hedge, D.O. [facility] Pager: [phone]  01/26/2019, 2:03 PM

## 2019-01-26 NOTE — Plan of Care (Signed)
  Problem: Clinical Measurements: Goal: Respiratory complications will improve Outcome: Completed/Met   Problem: Nutrition: Goal: Adequate nutrition will be maintained Outcome: Completed/Met   Problem: Coping: Goal: Level of anxiety will decrease Outcome: Completed/Met   Problem: Skin Integrity: Goal: Risk for impaired skin integrity will decrease Outcome: Completed/Met   Problem: Bowel/Gastric: Goal: Will show no signs and symptoms of gastrointestinal bleeding Outcome: Completed/Met

## 2019-01-27 ENCOUNTER — Encounter: Payer: Self-pay | Admitting: *Deleted

## 2019-01-27 ENCOUNTER — Telehealth: Payer: Self-pay | Admitting: Gastroenterology

## 2019-01-27 NOTE — Telephone Encounter (Signed)
Called patient to follow up. Dr. Collene Mares contacted me this AM, let me know she took care of him at the hospital for post polypectomy bleeding due to ascending polypectomy I performed on 1/15. He was discharged yesterday, he's doing well at this time. No further bleeding, just fatigued from the anemia. I don't expect him to have any recurrent bleeding post endoscopic therapy. He is taking MVI with iron in it. He will monitor his stools moving forward. Otherwise, no pain, feels well. He should avoid NSAIDs for the next few weeks, and let me know if any recurrence or problems moving forward. All questions answered.

## 2019-03-13 ENCOUNTER — Ambulatory Visit
Admission: RE | Admit: 2019-03-13 | Discharge: 2019-03-13 | Disposition: A | Discharge status: Home / Self Care | Payer: Managed Care, Other (non HMO) | Source: Ambulatory Visit | Attending: Physician Assistant | Admitting: Physician Assistant

## 2019-03-13 ENCOUNTER — Other Ambulatory Visit: Payer: Self-pay | Admitting: Physician Assistant

## 2019-03-13 ENCOUNTER — Encounter: Payer: Managed Care, Other (non HMO) | Attending: Physician Assistant | Admitting: Physician Assistant

## 2019-03-13 ENCOUNTER — Other Ambulatory Visit: Payer: Self-pay

## 2019-03-13 DIAGNOSIS — E11621 Type 2 diabetes mellitus with foot ulcer: Secondary | ICD-10-CM | POA: Insufficient documentation

## 2019-03-13 DIAGNOSIS — L97519 Non-pressure chronic ulcer of other part of right foot with unspecified severity: Secondary | ICD-10-CM

## 2019-03-13 DIAGNOSIS — B999 Unspecified infectious disease: Secondary | ICD-10-CM

## 2019-03-13 DIAGNOSIS — I10 Essential (primary) hypertension: Secondary | ICD-10-CM | POA: Insufficient documentation

## 2019-03-13 DIAGNOSIS — L97512 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d: Secondary | ICD-10-CM | POA: Insufficient documentation

## 2019-03-13 DIAGNOSIS — E1140 Type 2 diabetes mellitus with diabetic neuropathy, unspecified: Secondary | ICD-10-CM | POA: Insufficient documentation

## 2019-03-13 DIAGNOSIS — L84 Corns and callosities: Secondary | ICD-10-CM | POA: Insufficient documentation

## 2019-03-13 NOTE — Progress Notes (Signed)
MINAS, MIZERA ([id number]) Visit Report for 03/13/2019 Abuse/Suicide Risk Screen Details Patient Name: [name], [name]. Date of Service: 03/13/2019 9:45 AM Medical Record Number: [id number] Patient Account Number: [id number] Date of Birth/Sex: [birth year] (54 y.o. M) Treating RN: Montey Hora Primary Care Calin Fantroy: [name] Other Clinician: Referring Dezerae Freiberger: Referral, Self Treating Wretha Laris/Extender: STONE III, HOYT Weeks in Treatment: 0 Abuse/Suicide Risk Screen Items Answer ABUSE RISK SCREEN: Has anyone close to you tried to hurt or harm you recentlyo No Do you feel uncomfortable with anyone in your familyo No Has anyone forced you do things that you didnot want to doo No Electronic Signature(s) Signed: 03/13/2019 4:05:00 PM By: Montey Hora Entered By: Montey Hora on 03/13/2019 10:07:53 [name] -------------------------------------------------------------------------------- Activities of Daily Living Details Patient Name: [name] Date of Service: 03/13/2019 9:45 AM Medical Record Number: [id number] Patient Account Number: [id number] Date of Birth/Sex: 1965/12/20 (54 y.o. M) Treating RN: Montey Hora Primary Care Raegan Winders: [name] Other Clinician: Referring Reon Hunley: Referral, Self Treating Piera Downs/Extender: STONE III, HOYT Weeks in Treatment: 0 Activities of Daily Living Items Answer Activities of Daily Living (Please select one for each item) Drive Automobile Completely Able Take Medications Completely Able Use Telephone Completely Able Care for Appearance Completely Able Use Toilet Completely Able Bath / Shower Completely Able Dress Self Completely Able Feed Self Completely Able Walk Completely Able Get In / Out Bed Completely Able Housework Completely Able Prepare Meals Completely [place] for Self Completely Able Electronic Signature(s) Signed: 03/13/2019  4:05:00 PM By: Montey Hora Entered By: Montey Hora on 03/13/2019 10:08:11 [name] -------------------------------------------------------------------------------- Education Screening Details Patient Name: [name] Date of Service: 03/13/2019 9:45 AM Medical Record Number: [id number] Patient Account Number: [id number] Date of Birth/Sex: 03-25-1965 (54 y.o. M) Treating RN: Montey Hora Primary Care Viyan Rosamond: [name] Other Clinician: Referring Wille Aubuchon: Referral, Self Treating Jewelia Bocchino/Extender: Melburn Hake, HOYT Weeks in Treatment: 0 Primary Learner Assessed: Patient Learning Preferences/Education Level/Primary Language Learning Preference: Explanation, Demonstration Highest Education Level: High School Preferred Language: English Cognitive Barrier Language Barrier: No Translator Needed: No Memory Deficit: No Emotional Barrier: No Cultural/Religious Beliefs Affecting Medical Care: No Physical Barrier Impaired Vision: No Impaired Hearing: No Decreased Hand dexterity: No Knowledge/Comprehension Knowledge Level: Medium Comprehension Level: Medium Ability to understand written instructions: Medium Ability to understand verbal instructions: Medium Motivation Anxiety Level: Calm Cooperation: Cooperative Education Importance: Acknowledges Need Interest in Health Problems: Asks Questions Perception: Coherent Willingness to Engage in Self-Management Medium Activities: Readiness to Engage in Self-Management Medium Activities: Electronic Signature(s) Signed: 03/13/2019 4:05:00 PM By: Montey Hora Entered By: Montey Hora on 03/13/2019 10:08:44 [name] -------------------------------------------------------------------------------- Fall Risk Assessment Details Patient Name: [name] Date of Service: 03/13/2019 9:45 AM Medical Record Number: [id number] Patient Account Number:  [id number] Date of Birth/Sex: June 17, 1965 (54 y.o. M) Treating RN: Montey Hora Primary Care Mikias Lanz: [name] Other Clinician: Referring Elaine Middleton: Referral, Self Treating Zelma Snead/Extender: Melburn Hake, HOYT Weeks in Treatment: 0 Fall Risk Assessment Items Have you had 2 or more falls in the last 12 monthso 0 No Have you had any fall that resulted in injury in the last 12 monthso 0 No FALLS RISK SCREEN History of falling - immediate or within 3 months 0 No Secondary diagnosis (Do you have 2 or more medical diagnoseso) 0 No Ambulatory aid None/bed rest/wheelchair/nurse 0 Yes Crutches/cane/walker 0 No Furniture 0 No Intravenous therapy Access/Saline/Heparin Lock 0 No Gait/Transferring Normal/ bed rest/ wheelchair 0 Yes Weak (short steps  with or without shuffle, stooped but able to lift head while walking, may seek 0 No support from furniture) Impaired (short steps with shuffle, may have difficulty arising from chair, head down, impaired 0 No balance) Mental Status Oriented to own ability 0 Yes Electronic Signature(s) Signed: 03/13/2019 4:05:00 PM By: Montey Hora Entered By: Montey Hora on 03/13/2019 10:08:51 [name] -------------------------------------------------------------------------------- Foot Assessment Details Patient Name: [name] Date of Service: 03/13/2019 9:45 AM Medical Record Number: [id number] Patient Account Number: [id number] Date of Birth/Sex: September 13, 1965 (54 y.o. M) Treating RN: Montey Hora Primary Care Franca Stakes: [name] Other Clinician: Referring Yvett Rossel: Referral, Self Treating Jehu Mccauslin/Extender: STONE III, HOYT Weeks in Treatment: 0 Foot Assessment Items Site Locations + = Sensation present, - = Sensation absent, C = Callus, U = Ulcer R = Redness, W = Warmth, M = Maceration, PU = Pre-ulcerative lesion F = Fissure, S = Swelling, D = Dryness Assessment Right: Left: Other Deformity: No  No Prior Foot Ulcer: No No Prior Amputation: No No Charcot Joint: No No Ambulatory Status: Ambulatory Without Help Gait: Steady Electronic Signature(s) Signed: 03/13/2019 4:05:00 PM By: Montey Hora Entered By: Montey Hora on 03/13/2019 10:09:59 [name] -------------------------------------------------------------------------------- Nutrition Risk Screening Details Patient Name: [name] Date of Service: 03/13/2019 9:45 AM Medical Record Number: [id number] Patient Account Number: [id number] Date of Birth/Sex: [birth year] (54 y.o. M) Treating RN: Montey Hora Primary Care Andelyn Spade: [name] Other Clinician: Referring Reynold Mantell: Referral, Self Treating Marlinda Miranda/Extender: STONE III, HOYT Weeks in Treatment: 0 Height (in): 74 Weight (lbs): 308 Body Mass Index (BMI): 39.5 Nutrition Risk Screening Items Score Screening NUTRITION RISK SCREEN: I have an illness or condition that made me change the kind and/or amount of food I eat 0 No I eat fewer than two meals per day 0 No I eat few fruits and vegetables, or milk products 0 No I have three or more drinks of beer, liquor or wine almost every day 0 No I have tooth or mouth problems that make it hard for me to eat 0 No I don't always have enough money to buy the food I need 0 No I eat alone most of the time 0 No I take three or more different prescribed or over-the-counter drugs a day 1 Yes Without wanting to, I have lost or gained 10 pounds in the last six months 0 No I am not always physically able to shop, cook and/or feed myself 0 No Nutrition Protocols Good Risk Protocol 0 No interventions needed Moderate Risk Protocol High Risk Proctocol Risk Level: Good Risk Score: 1 Electronic Signature(s) Signed: 03/13/2019 4:05:00 PM By: Montey Hora Entered By: Montey Hora on 03/13/2019 10:08:59

## 2019-03-13 NOTE — Progress Notes (Signed)
[name], [name] ([id number]) Visit Report for 03/13/2019 Allergy List Details Patient Name: [name], [name]. Date of Service: 03/13/2019 9:45 AM Medical Record Number: [id number] Patient Account Number: [id number] Date of Birth/Sex: 1965/02/08 (54 y.o. M) Treating RN: Montey Hora Primary Care Manson Luckadoo: [name] Other Clinician: Referring Noora Locascio: Referral, Self Treating Margareta Laureano/Extender: STONE III, HOYT Weeks in Treatment: 0 Allergies Active Allergies No Known Allergies Allergy Notes Electronic Signature(s) Signed: 03/13/2019 4:05:00 PM By: Montey Hora Entered By: Montey Hora on 03/13/2019 10:06:24 [name] ([id number]) -------------------------------------------------------------------------------- Arrival Information Details Patient Name: [name] Date of Service: 03/13/2019 9:45 AM Medical Record Number: [id number] Patient Account Number: [id number] Date of Birth/Sex: [birth year] (54 y.o. M) Treating RN: [name] Primary Care Raedyn Klinck: [name] Other Clinician: Referring Negar Sieler: Referral, Self Treating Layken Doenges/Extender: Melburn Hake, HOYT Weeks in Treatment: 0 Visit Information Patient Arrived: Ambulatory Arrival Time: 09:58 Accompanied By: self Transfer Assistance: None Patient Identification Verified: Yes Secondary Verification Process Completed: Yes Patient Has Alerts: Yes Patient Alerts: DMII History Since Last Visit Electronic Signature(s) Signed: 03/13/2019 4:05:00 PM By: Montey Hora Entered By: Montey Hora on 03/13/2019 10:10:11 [name] ([id number]) -------------------------------------------------------------------------------- Clinic Level of Care Assessment Details Patient Name: [name] Date of Service: 03/13/2019 9:45 AM Medical Record Number: [id number] Patient Account Number: [id number] Date of Birth/Sex: January 06, 1965 (54 y.o. M) Treating RN: [name] Primary  Care Lasha Echeverria: [name] Other Clinician: Referring Bethaney Oshana: Referral, Self Treating Spencer Peterkin/Extender: Melburn Hake, HOYT Weeks in Treatment: 0 Clinic Level of Care Assessment Items TOOL 1 Quantity Score []  - Use when EandM and Procedure is performed on INITIAL visit 0 ASSESSMENTS - Nursing Assessment / Reassessment X - General Physical Exam (combine w/ comprehensive assessment (listed just below) when performed on new pt. 1 20 evals) X- 1 25 Comprehensive Assessment (HX, ROS, Risk Assessments, Wounds Hx, etc.) ASSESSMENTS - Wound and Skin Assessment / Reassessment []  - Dermatologic / Skin Assessment (not related to wound area) 0 ASSESSMENTS - Ostomy and/or Continence Assessment and Care []  - Incontinence Assessment and Management 0 []  - 0 Ostomy Care Assessment and Management (repouching, etc.) PROCESS - Coordination of Care X - Simple Patient / Family Education for ongoing care 1 15 []  - 0 Complex (extensive) Patient / Family Education for ongoing care X- 1 10 Staff obtains Programmer, systems, Records, Test Results / Process Orders []  - 0 Staff telephones HHA, Nursing Homes / Clarify orders / etc []  - 0 Routine Transfer to another Facility (non-emergent condition) []  - 0 Routine Hospital Admission (non-emergent condition) X- 1 15 New Admissions / Biomedical engineer / Ordering NPWT, Apligraf, etc. []  - 0 Emergency Hospital Admission (emergent condition) PROCESS - Special Needs []  - Pediatric / Minor Patient Management 0 []  - 0 Isolation Patient Management []  - 0 Hearing / Language / Visual special needs []  - 0 Assessment of Community assistance (transportation, D/C planning, etc.) []  - 0 Additional assistance / Altered mentation []  - 0 Support Surface(s) Assessment (bed, cushion, seat, etc.) INTERVENTIONS - Miscellaneous []  - External ear exam 0 []  - 0 Patient Transfer (multiple staff / Civil Service fast streamer / Similar devices) []  - 0 Simple Staple / Suture removal (25 or  less) []  - 0 Complex Staple / Suture removal (26 or more) []  - 0 Hypo/Hyperglycemic Management (do not check if billed separately) OTHAR, NEVILLS. ([id number]) []  - 0 Ankle / Brachial Index (ABI) - do not check if billed separately Has the patient been seen at the hospital within the last three years: Yes  Total Score: 85 Level Of Care: New/Established - Level 3 Electronic Signature(s) Signed: 03/13/2019 3:43:50 PM By: [name] Entered By: [name] on 03/13/2019 10:40:42 [name] ([id number]) -------------------------------------------------------------------------------- Encounter Discharge Information Details Patient Name: [name] Date of Service: 03/13/2019 9:45 AM Medical Record Number: [id number] Patient Account Number: [id number] Date of Birth/Sex: 1965-04-17 (54 y.o. M) Treating RN: [name] Primary Care Deontra Pereyra: [name] Other Clinician: Referring Crescent Gotham: Referral, Self Treating Quinto Tippy/Extender: Melburn Hake, HOYT Weeks in Treatment: 0 Encounter Discharge Information Items Post Procedure Vitals Discharge Condition: Stable Temperature (F): 98.1 Ambulatory Status: Ambulatory Pulse (bpm): 92 Discharge Destination: Home Respiratory Rate (breaths/min): 16 Transportation: Private Auto Blood Pressure (mmHg): 168/62 Accompanied By: self Schedule Follow-up Appointment: Yes Clinical Summary of Care: Electronic Signature(s) Signed: 03/13/2019 3:43:50 PM By: [name] Entered By: [name] on 03/13/2019 10:42:20 [name] ([id number]) -------------------------------------------------------------------------------- Lower Extremity Assessment Details Patient Name: [name] Date of Service: 03/13/2019 9:45 AM Medical Record Number: [id number] Patient Account Number: [id number] Date of Birth/Sex: 03-21-1965 (54 y.o. M) Treating RN: Montey Hora Primary Care Ranen Doolin: [name] Other  Clinician: Referring Zadin Lange: Referral, Self Treating Tyrique Sporn/Extender: STONE III, HOYT Weeks in Treatment: 0 Edema Assessment Assessed: [Left: No] [Right: No] Edema: [Left: Ye] [Right: s] Calf Left: Right: Point of Measurement: 36 cm From Medial Instep cm 42 cm Ankle Left: Right: Point of Measurement: 12 cm From Medial Instep cm 28.5 cm Vascular Assessment Pulses: Dorsalis Pedis Palpable: [Right:Yes] Doppler Audible: [Right:Yes] Posterior Tibial Palpable: [Right:Yes Yes] Notes ABI [place] Bilateral >220 Electronic Signature(s) Signed: 03/13/2019 4:05:00 PM By: Montey Hora Entered By: Montey Hora on 03/13/2019 10:21:40 [name] ([id number]) -------------------------------------------------------------------------------- Multi Wound Chart Details Patient Name: [name] Date of Service: 03/13/2019 9:45 AM Medical Record Number: [id number] Patient Account Number: [id number] Date of Birth/Sex: [birth year] (54 y.o. M) Treating RN: [name] Primary Care Anndrea Mihelich: [name] Other Clinician: Referring Yamaira Spinner: Referral, Self Treating Rayen Palen/Extender: STONE III, HOYT Weeks in Treatment: 0 Vital Signs Height(in): 74 Pulse(bpm): 92 Weight(lbs): 308 Blood Pressure(mmHg): 168/62 Body Mass Index(BMI): 40 Temperature(F): 98.1 Respiratory Rate(breaths/min): 18 Photos: [N/A:N/A] Wound Location: Right Foot - Lateral N/A N/A Wounding Event: Gradually Appeared N/A N/A Primary Etiology: Diabetic Wound/Ulcer of the Lower N/A N/A Extremity Comorbid History: Hypertension, Type II Diabetes, N/A N/A Neuropathy Date Acquired: 03/12/2019 N/A N/A Weeks of Treatment: 0 N/A N/A Wound Status: Open N/A N/A Pending Amputation on Yes N/A N/A Presentation: Measurements L x W x D (cm) 0.9x0.9x0.2 N/A N/A Area (cm) : 0.636 N/A N/A Volume (cm) : 0.127 N/A N/A Classification: Grade 1 N/A N/A Exudate Amount: Medium N/A N/A Exudate Type: Serous N/A  N/A Exudate Color: amber N/A N/A Wound Margin: Flat and Intact N/A N/A Granulation Amount: None Present (0%) N/A N/A Necrotic Amount: Large (67-100%) N/A N/A Necrotic Tissue: Eschar, Adherent Slough N/A N/A Exposed Structures: Fat Layer (Subcutaneous Tissue) N/A N/A Exposed: Yes Fascia: No Tendon: No Muscle: No Joint: No Bone: No Epithelialization: None N/A N/A Treatment Notes Electronic Signature(s) Signed: 03/13/2019 3:43:50 PM By: [name] Entered By: [name] on 03/13/2019 10:31:09 [name] ([name] ([id number]) -------------------------------------------------------------------------------- [place] Details Patient Name: DANEIL, CARLO. Date of Service: 03/13/2019 9:45 AM Medical Record Number: [id number] Patient Account Number: [id number] Date of Birth/Sex: 03/22/1965 (54 y.o. M) Treating RN: [name] Primary Care Odis Wickey: [name] Other Clinician: Referring Eulice Rutledge: Referral, Self Treating Vee Bahe/Extender: Melburn Hake, HOYT Weeks in Treatment: 0 Active Inactive Nutrition Nursing Diagnoses: Impaired glucose  control: actual or potential Goals: Patient/caregiver verbalizes understanding of need to maintain therapeutic glucose control per primary care physician Date Initiated: 03/13/2019 Target Resolution Date: 04/11/2019 Goal Status: Active Interventions: Assess patient nutrition upon admission and as needed per policy Notes: Orientation to the Wound Care Program Nursing Diagnoses: Knowledge deficit related to the wound healing center program Goals: Patient/caregiver will verbalize understanding of the [place] Date Initiated: 03/13/2019 Target Resolution Date: 04/11/2019 Goal Status: Active Interventions: Provide education on orientation to the wound center Notes: Wound/Skin Impairment Nursing Diagnoses: Impaired tissue integrity Goals: Ulcer/skin breakdown  will have a volume reduction of 30% by week 4 Date Initiated: 03/13/2019 Target Resolution Date: 04/11/2019 Goal Status: Active Interventions: Assess ulceration(s) every visit Notes: Electronic Signature(s) Signed: 03/13/2019 3:43:50 PM By: [name] Entered By: [name] on 03/13/2019 10:30:55 [name] ([name] ([id number]) -------------------------------------------------------------------------------- Pain Assessment Details Patient Name: [name] Date of Service: 03/13/2019 9:45 AM Medical Record Number: [id number] Patient Account Number: [id number] Date of Birth/Sex: 1965-09-06 (54 y.o. M) Treating RN: [name] Primary Care Trason Shifflet: [name] Other Clinician: Referring Keah Lamba: Referral, Self Treating Dyamond Tolosa/Extender: Melburn Hake, HOYT Weeks in Treatment: 0 Active Problems Location of Pain Severity and Description of Pain Patient Has Paino No Site Locations Pain Management and Medication Current Pain Management: Electronic Signature(s) Signed: 03/13/2019 3:43:50 PM By: [name] Signed: 03/13/2019 3:53:04 PM By: Lorine Bears RCP, RRT, CHT Entered By: Lorine Bears on 03/13/2019 09:59:31 [name] ([id number]) -------------------------------------------------------------------------------- Patient/Caregiver Education Details Patient Name: [name] Date of Service: 03/13/2019 9:45 AM Medical Record Number: [id number] Patient Account Number: [id number] Date of Birth/Gender: 10-Feb-1965 (54 y.o. M) Treating RN: [name] Primary Care Physician: [name] Other Clinician: Referring Physician: Referral, Self Treating Physician/Extender: Melburn Hake, HOYT Weeks in Treatment: 0 Education Assessment Education Provided To: Patient Education Topics Provided Wound/Skin Impairment: Handouts: Caring for Your Ulcer Methods: Demonstration,  Explain/Verbal Responses: State content correctly Electronic Signature(s) Signed: 03/13/2019 3:43:50 PM By: [name] Entered By: [name] on 03/13/2019 10:40:55 [name] ([id number]) -------------------------------------------------------------------------------- Wound Assessment Details Patient Name: [name] Date of Service: 03/13/2019 9:45 AM Medical Record Number: [id number] Patient Account Number: [id number] Date of Birth/Sex: [birth year] (54 y.o. M) Treating RN: Montey Hora Primary Care Reighlyn Elmes: [name] Other Clinician: Referring Nicholis Stepanek: Referral, Self Treating Petrea Fredenburg/Extender: STONE III, HOYT Weeks in Treatment: 0 Wound Status Wound Number: 4 Primary Etiology: Diabetic Wound/Ulcer of the Lower Extremity Wound Location: Right Foot - Lateral Wound Status: Open Wounding Event: Gradually Appeared Comorbid History: Hypertension, Type II Diabetes, Neuropathy Date Acquired: 03/12/2019 Weeks Of Treatment: 0 Clustered Wound: No Pending Amputation On Presentation Photos Wound Measurements Length: (cm) 0.9 % Reduc Width: (cm) 0.9 % Reduc Depth: (cm) 0.2 Epithel Area: (cm) 0.636 Tunnel Volume: (cm) 0.127 Underm tion in Area: tion in Volume: ialization: None ing: No ining: No Wound Description Classification: Grade 1 Foul Od Wound Margin: Flat and Intact Slough/ Exudate Amount: Medium Exudate Type: Serous Exudate Color: amber or After Cleansing: No Fibrino Yes Wound Bed Granulation Amount: None Present (0%) Exposed Structure Necrotic Amount: Large (67-100%) Fascia Exposed: No Necrotic Quality: Eschar, Adherent Slough Fat Layer (Subcutaneous Tissue) Exposed: Yes Tendon Exposed: No Muscle Exposed: No Joint Exposed: No Bone Exposed: No Treatment Notes Wound #4 (Right, Lateral Foot) Notes Santyl, gauze, conform Electronic Signature(s) MATI, STELLWAGEN ([id number]) Signed: 03/13/2019 4:05:00 PM By: Montey Hora Entered By: Montey Hora on 03/13/2019 10:15:34 [name] ([id number]) -------------------------------------------------------------------------------- Vitals  Details Patient Name: LADARRYL, MCCUSKEY. Date of Service: 03/13/2019 9:45 AM Medical Record Number: [id number] Patient Account Number: [id number] Date of Birth/Sex: [birth year] (53 y.o. M) Treating RN: [name] Primary Care Sokhna Christoph: [name] Other Clinician: Referring Voshon Petro: Referral, Self Treating Shavonna Corella/Extender: STONE III, HOYT Weeks in Treatment: 0 Vital Signs Time Taken: 09:59 Temperature (F): 98.1 Height (in): 74 Pulse (bpm): 92 Source: Stated Respiratory Rate (breaths/min): 18 Weight (lbs): 308 Blood Pressure (mmHg): 168/62 Source: Measured Reference Range: 80 - 120 mg / dl Body Mass Index (BMI): 39.5 Electronic Signature(s) Signed: 03/13/2019 3:53:04 PM By: Lorine Bears RCP, RRT, CHT Entered By: Lorine Bears on 03/13/2019 10:01:27

## 2019-03-14 NOTE — Progress Notes (Signed)
TYMON, DETZLER ([id number]) Visit Report for 03/13/2019 Chief Complaint Document Details Patient Name: [name], [name]. Date of Service: 03/13/2019 9:45 AM Medical Record Number: [id number] Patient Account Number: [id number] Date of Birth/Sex: 08/16/1965 (54 y.o. M) Treating RN: [name] Primary Care Provider: [name] Other Clinician: Referring Provider: Referral, Self Treating Provider/Extender: Melburn Hake, Curtiss Mahmood Weeks in Treatment: 0 Information Obtained from: Patient Chief Complaint Right later foot ulcer Electronic Signature(s) Signed: 03/13/2019 10:26:52 AM By: [name] PA-C Entered By: [name] on 03/13/2019 10:26:52 [name] ([id number]) -------------------------------------------------------------------------------- Debridement Details Patient Name: [name] Date of Service: 03/13/2019 9:45 AM Medical Record Number: [id number] Patient Account Number: [id number] Date of Birth/Sex: 1965-01-11 (54 y.o. M) Treating RN: [name] Primary Care Provider: [name] Other Clinician: Referring Provider: Referral, Self Treating Provider/Extender: STONE III, Vani Gunner Weeks in Treatment: 0 Debridement Performed for Wound #4 Right,Lateral Foot Assessment: Performed By: Physician STONE III, Marenda Accardi E., PA-C Debridement Type: Debridement Severity of Tissue Pre Debridement: Fat layer exposed Level of Consciousness (Pre- Awake and Alert procedure): Pre-procedure Verification/Time Out Yes - 10:33 Taken: Start Time: 10:34 Pain Control: Lidocaine Total Area Debrided (L x W): 0.9 (cm) x 0.9 (cm) = 0.81 (cm) Tissue and other material debrided: Viable, Non-Viable, Callus, Slough, Subcutaneous, Skin: Dermis , Slough Level: Skin/Subcutaneous Tissue Debridement Description: Excisional Instrument: Forceps, Scissors Bleeding: Minimum Hemostasis Achieved: Pressure End Time: 10:35 Response to Treatment: Procedure was tolerated  well Level of Consciousness (Post- Awake and Alert procedure): Post Debridement Measurements of Total Wound Length: (cm) 0.9 Width: (cm) 0.9 Depth: (cm) 0.2 Volume: (cm) 0.127 Character of Wound/Ulcer Post Debridement: Stable Severity of Tissue Post Debridement: Fat layer exposed Post Procedure Diagnosis Same as Pre-procedure Electronic Signature(s) Signed: 03/13/2019 3:43:50 PM By: [name] Signed: 03/14/2019 4:19:26 PM By: [name] PA-C Entered By: [name] on 03/13/2019 10:35:43 [name] ([id number]) -------------------------------------------------------------------------------- HPI Details Patient Name: [name] Date of Service: 03/13/2019 9:45 AM Medical Record Number: [id number] Patient Account Number: [id number] Date of Birth/Sex: [birth year] (54 y.o. M) Treating RN: [name] Primary Care Provider: [name] Other Clinician: Referring Provider: Referral, Self Treating Provider/Extender: STONE III, Cambrea Kirt Weeks in Treatment: 0 History of Present Illness HPI Description: This 54 year old male comes with an ulcerated area on the plantar aspect of the right foot which she's had for approximately a month. I have known him from a previous visit at [facility] wound center and was treated in the months of April and May 2016 and rapidly healed a left plantar ulcer with a total contact cast. He has been a diabetic for about 16 years and tries to keep active and is fairly compliant with his diabetes management. 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 Past medical history significant for hypertension, hyperlipidemia, and status post appendectomy 1993. He does not smoke or drink alcohol. 01/14/2015 -- the patient had tolerated his total contact cast very well and had no problems and has had no systemic symptoms. However when his total contact cast was cut open he had excessive amount of purulent drainage in spite of being on  antibiotics. He had had a recent x-ray done in the ER 12/22/2014 which showed IMPRESSION:No evidence of osseous erosion. Known soft tissue ulceration is not well characterized on radiograph. Scattered vascular calcifications seen. his last hemoglobin A1c in December was 7.3. He has been on Augmentin and doxycycline for the last 2 weeks. 01/21/2015 -- his culture grew rare growth of Pantoea species an MR moderate growth  of Candida parapsilosis. it is sensitive to levofloxacin. He has not heard back from the insurance company regarding his hyperbaric oxygen therapy. His MRI has not been done yet and we will try and get him an earlier date 01/28/2015 -- MRI was done last night -- IMPRESSION:1. Soft tissue ulcer overlying the plantar aspect of the fifth metatarsal head extending to the cortex. Subcortical marrow edema in the fifth metatarsal head with corresponding T1 hypointensity is concerning for early osteomyelitis of the plantar lateral aspect of the fifth metatarsal head. Chest x-ray done on 01/14/2015 shows bronchiectatic changes without infiltrate. EKG done on generally 17 2017 shows a normal sinus rhythm and is a normal EKG. 02/04/2015 -- he was asked to see Dr. [name] last week and had 2 appointments but had to cancel both due to pressures of work. Last night he has woken up with severe pain in the foot and leg and it is swollen up. No fever or no change in his blood glucose. Addendum: I spoke with Dr. [name] who kindly agreed to accept the patient for inpatient therapy and have also opened to the hospitalist Dr. [name], and discuss details of the management including PICC line and repeat cultures. 02/12/2015-- -- was seen by Dr. [name] in the hospital and a PICC line was placed. He was to receive Ceftazidime 2 g every 12 hourly, oral levofloxacin 750 mg every 24 hourly and oral fluconazole 200 mg daily. The antibiotics were to be given for 4 weeks except the Diflucan was to  be given for the first 2 weeks. Reviewed note from 02/10/2015 -- and Dr. [name] had recommended management for growth of MSSA and Serratia. He switched him from ceftazidime to ceftriaxone 2 g every 24 hours. Levofloxacin was stopped and he would continue on fluconazole for another week. He had asked me to decide whether further imaging was necessary and whether surgical debridement of the infected bone was needed. He is doing well and has been off work for this week and we will keep him off the next week. 02/22/2015 -- he was seen by my colleague on 01/19/2015 and at that time an incision and drainage was done on his right lateral forefoot on the dorsum. Today when I probed this wound it is frankly draining pus and it communicates with the ulcer on the plantar aspect of his right foot. The patient is still on IV ceftriaxone 2 g every 24 hours and is to be seen by Dr. [name] on Friday. 03/19/2015 -- On 03/04/2015 I spoke to Dr. 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 He recommended operative debridement which would probably result in the fifth metatarsal head and toe amputation.The patient would be referred back to [place] once he was done with surgery. He was admitted to [facility] yesterday and had surgery done by podiatry for a right fifth metatarsal acute osteomyelitis with cellulitis and abscess. He had a right foot incision and drainage with fifth metatarsal partial amputation and removal of toe infected bone and soft tissue with cultures. The wound was partially closed and packing of the distal end was done. Patient was already on cefepime 2 g IV every 8 hourly and put on vancomycin pending final cultures. He had grown moderate gram-negative rods, and later found to be rare diphtheroids. I received a call from  Dr. Cannon Kettle  the podiatrist and we discussed the above. On 03/10/2015 he was found positive for influenza a and has been put on Tamiflu. Since his discharge he has been seen by Dr. Cannon Kettle who is planning to remove his sutures this coming week. He was reviewed by Dr. [name] on 03/17/2015 and his Diflucan was stopped and vancomycin. After the 3 doses he is taking. He is going to change Ceftazidime to Zosyn 3.375 g IV every 8 hours. 03/25/2015 -- he was seen by the podiatrist a couple of days ago and the sutures were removed. She will follow back with him in 4 weeks' time and at that time x-rays will be taken and a custom molded insert would be made for his shoe. 04/01/2015 -- he was seen by Dr. [name] on 03/29/2015 who pulled the PICC line stopped his IV antibiotics and recommended starting doxycycline [name], [name]. ([id number]) and levofloxacin for 2 weeks. He also stop the fluconazole. The patient will follow up with him only when necessary. Readmission: 03/15/17 on evaluation today patient presents for initial evaluation concerning the new issues although he has previously been evaluated in our clinic. Unfortunately the previous evaluation led to the patient having to proceed to amputation and so he is somewhat nervous about being here today. With that being said he has a very slight blister that occurred on the plantar aspect more medial on the right great toe that has been present for just a very short amount of time, several days. With that being said he felt like initially when he called that he somewhat overreacted but due to the fact that his previous issue led to amputation he is very cautious these days I explained that he did the right thing. With that being said he has been tolerating the dressing changes without complication mainly he just been covering this. He does not have any discomfort in secondary to neuropathy it's unlikely that he feels much. With that being said he does tell me  that the issue that he had here is that he went barefoot when he knows he should not have which subsequently led to the blisters. He states is definitely not doing that anymore. His most recent hemoglobin A1c was December and registered 6.9 his ABI today was 1.1. 03/27/17 on evaluation today patient appears to be doing great in regard to his right great toe ulcer. He has been tolerating the dressing changes without complication. The good news is this is making excellent progress and he seems to be caring for this in an excellent fashion. I see no evidence of breakdown that would make me concerned that he was at risk for infection/amputation. This has obviously been his concern due to the fifth toe amputation that was necessitated previous when he had a similar issue. Nonetheless this seems to be progressing much more nicely. Readmission: 03/13/2019 upon evaluation today patient presents for reevaluation here in the clinic concerning issues he has been having with his right foot on the lateral portion at the proximal end of the metatarsal. Subsequently he tells me that this just opened up in the past couple of days or so and the erythema really began about 24 to 48 hours ago. Fortunately there is no signs of systemic infection. 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 That left him with this bony prominence that is where the wound is currently. There is erythema surrounding that does have me concerned about cellulitis. With  that being said he was noncompressible as far as ABIs are concerned I do think we can need to check into arterial studies at this point. Patient does have a history of hypertension along with diabetes mellitus type 2. He also tends to develop quite a bit of callus white often. Electronic Signature(s) Signed: 03/13/2019 1:26:03 PM By: [name] PA-C Entered By: [name] on 03/13/2019 13:26:03 [name]  ([id number]) -------------------------------------------------------------------------------- Physical Exam Details Patient Name: ZAKARIAS, SHIRAZI Date of Service: 03/13/2019 9:45 AM Medical Record Number: [id number] Patient Account Number: [id number] Date of Birth/Sex: Jun 23, 1965 (54 y.o. M) Treating RN: [name] Primary Care Provider: [name] Other Clinician: Referring Provider: Referral, Self Treating Provider/Extender: STONE III, Haley Roza Weeks in Treatment: 0 Constitutional sitting or standing blood pressure is within target range for patient.. pulse regular and within target range for patient.Marland Kitchen respirations regular, non-labored and within target range for patient.Marland Kitchen temperature within target range for patient.. Well-nourished and well-hydrated in no acute distress. Eyes conjunctiva clear no eyelid edema noted. pupils equal round and reactive to light and accommodation. Ears, Nose, Mouth, and Throat no gross abnormality of ear auricles or external auditory canals. normal hearing noted during conversation. mucus membranes moist. Respiratory normal breathing without difficulty. Cardiovascular 2+ dorsalis pedis/posterior tibialis pulses. 2+ pitting edema of the bilateral lower extremities. Musculoskeletal normal gait and posture. no significant deformity or arthritic changes, no loss or range of motion, no clubbing. Psychiatric this patient is able to make decisions and demonstrates good insight into disease process. Alert and Oriented x 3. pleasant and cooperative. Notes Upon inspection patient's wound bed actually showed signs of poor tissue in the central portion of the wound. In fact this appears to be more of a pressure/friction type injury with necrotic tissue have been broken down as a result of this. He did recently get new shoes which may have played a role in the breakdown here. With that being said unfortunately this is also somewhat tender he does seem to have  some signs of infection based on what I am seeing currently. He also has significant edema on the lower extremity 2+ pitting. Hopefully the infection is limited to just cellulitis I think we can definitely need to address that today however. I did perform a very mild sharp debridement to remove some of the necrotic debris I did not want to get too aggressive until we confirm arterial flow. Electronic Signature(s) Signed: 03/13/2019 1:27:02 PM By: [name] PA-C Entered By: [name] on 03/13/2019 13:27:02 [name] ([id number]) -------------------------------------------------------------------------------- Physician Orders Details Patient Name: [name] Date of Service: 03/13/2019 9:45 AM Medical Record Number: [id number] Patient Account Number: [id number] Date of Birth/Sex: 13-Apr-1965 (54 y.o. 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 - in office o Dial antibacterial soap, wash wounds, rinse and pat dry prior to dressing wounds Primary Wound Dressing Wound #4 Right,Lateral Foot o Santyl Ointment Secondary Dressing Wound #4 Right,Lateral Foot o Gauze and Kerlix/Conform Dressing Change Frequency Wound #4 Right,Lateral Foot o Change dressing every  day. Off-Loading Wound #4 Right,Lateral Foot o Open toe surgical shoe with peg assist. Consults o Vascular - ABI TBI Radiology o X-ray, foot - right  foot Patient Medications Allergies: No Known Allergies Notifications Medication Indication Start End Bactrim DS 03/13/2019 DOSE 1 - oral 800 mg-160 mg tablet - 1 tablet oral taken 2 times per day for 10 days. Santyl 03/13/2019 DOSE topical 250 unit/gram ointment - ointment topical Apply nickel thick to the wound bed and then cover with a dressing as directed in clinic. [name], [name] ([id number]) Electronic Signature(s) Signed: 03/13/2019 1:01:29 PM By: [name] PA-C Previous Signature: 03/13/2019 11:35:22 AM Version By: [name] PA-C Entered By: [name] on 03/13/2019 13:01:28 [name], [name] ([id number]) -------------------------------------------------------------------------------- Problem List Details Patient Name: GRIMM, BASSANI. Date of Service: 03/13/2019 9:45 AM Medical Record Number: [id number] Patient Account Number: [id number] Date of Birth/Sex: Jun 25, 1965 (54 y.o. 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 M) Treating RN: [name] Primary Care Provider: [name] Other Clinician: Referring Provider: Referral, Self Treating Provider/Extender: Melburn Hake, Romar Woodrick Weeks in Treatment: 0 Subjective Chief Complaint Information obtained from Patient Right later foot ulcer History of Present Illness (HPI) This 54 year old male comes with an ulcerated area on the plantar aspect of the right foot which she's had for approximately a month. I have known him from a previous visit at [facility] wound center and was treated in the months of April and May 2016 and rapidly healed a left plantar ulcer with a total contact cast. He has been a diabetic for about 16 years and tries to keep active and is fairly compliant with his diabetes management. 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 Past medical history significant for hypertension, hyperlipidemia, and status post appendectomy 1993. He does not smoke or drink alcohol. 01/14/2015 -- the patient had tolerated his total contact cast very well and had no problems and has had no systemic symptoms. However when his total contact cast was cut open he had excessive amount of purulent drainage in spite of being on antibiotics. He had had a recent x-ray done in the ER 12/22/2014 which showed IMPRESSION:No evidence of osseous erosion. Known soft tissue ulceration is not well characterized on radiograph. Scattered vascular calcifications seen. his last hemoglobin A1c in December was 7.3. He has been on Augmentin and doxycycline for the last 2 weeks. 01/21/2015 -- his culture grew rare growth of Pantoea species an MR moderate growth of Candida parapsilosis. it is sensitive to levofloxacin. He has not heard back from the insurance company regarding his hyperbaric oxygen therapy. His MRI has not been done yet and we will try and get him an earlier  date 01/28/2015 --  MRI was done last night -- IMPRESSION:1. Soft tissue ulcer overlying the plantar aspect of the fifth metatarsal head extending to the cortex. Subcortical marrow edema in the fifth metatarsal head with corresponding T1 hypointensity is concerning for early osteomyelitis of the plantar lateral aspect of the fifth metatarsal head. Chest x-ray done on 01/14/2015 shows bronchiectatic changes without infiltrate. EKG done on generally 17 2017 shows a normal sinus rhythm and is a normal EKG. 02/04/2015 -- he was asked to see Dr. [name] last week and had 2 appointments but had to cancel both due to pressures of work. Last night he has woken up with severe pain in the foot and leg and it is swollen up. No fever or no change in his blood glucose. Addendum: I spoke with Dr. [name] who kindly agreed to accept the patient for inpatient therapy and have also opened to the hospitalist Dr. [name], and discuss details of the management including PICC line and repeat cultures. 02/12/2015-- -- was seen by Dr. [name] in the hospital and a PICC line was placed. He was to receive Ceftazidime 2 g every 12 hourly, oral levofloxacin 750 mg every 24 hourly and oral fluconazole 200 mg daily. The antibiotics were to be given for 4 weeks except the Diflucan was to be given for the first 2 weeks. Reviewed note from 02/10/2015 -- and Dr. [name] had recommended management for growth of MSSA and Serratia. He switched him from ceftazidime to ceftriaxone 2 g every 24 hours. Levofloxacin was stopped and he would continue on fluconazole for another week. He had asked me to decide whether further imaging was necessary and whether surgical debridement of the infected bone was needed. He is doing well and has been off work for this week and we will keep him off the next week. 02/22/2015 -- he was seen by my colleague on 01/19/2015 and at that time an incision and drainage was done on his right lateral  forefoot on the dorsum. Today when I probed this wound it is frankly draining pus and it communicates with the ulcer on the plantar aspect of his right foot. The patient is still on IV ceftriaxone 2 g every 24 hours and is to be seen by Dr. [name] on Friday. 03/19/2015 -- On 03/04/2015 I spoke to Dr. 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 He recommended operative debridement which would probably result in the fifth metatarsal head and toe amputation.The patient would be referred back to [place] once he was done with surgery. He was admitted to [facility] yesterday and had surgery done by podiatry for a right fifth metatarsal acute osteomyelitis with cellulitis and abscess. He had a right foot incision and drainage with fifth metatarsal partial amputation and removal of toe infected bone and soft tissue with cultures. The wound was partially closed and packing of the distal end was done. Patient was already on cefepime 2 g IV every 8 hourly and put on vancomycin pending final cultures. He had grown moderate gram-negative rods, and later found to be rare diphtheroids. I received a call from Dr. Cannon Kettle the podiatrist and we discussed the above. On 03/10/2015 he was found positive for influenza a and has been put on Tamiflu. Since his discharge he has been seen by Dr. Cannon Kettle who is planning to remove his sutures  this coming week. He was reviewed by Dr. [name] on 03/17/2015 and his Diflucan was stopped and vancomycin. After the 3 doses he is taking. He is going to change Ceftazidime to Zosyn 3.375 g IV every 8 hours. [name], [name] ([id number]) 03/25/2015 -- he was seen by the podiatrist a couple of days ago and the sutures were removed. She will follow back with him in 4 weeks' time and at that time x-rays will be taken and  a custom molded insert would be made for his shoe. 04/01/2015 -- he was seen by Dr. [name] on 03/29/2015 who pulled the PICC line stopped his IV antibiotics and recommended starting doxycycline and levofloxacin for 2 weeks. He also stop the fluconazole. The patient will follow up with him only when necessary. Readmission: 03/15/17 on evaluation today patient presents for initial evaluation concerning the new issues although he has previously been evaluated in our clinic. Unfortunately the previous evaluation led to the patient having to proceed to amputation and so he is somewhat nervous about being here today. With that being said he has a very slight blister that occurred on the plantar aspect more medial on the right great toe that has been present for just a very short amount of time, several days. With that being said he felt like initially when he called that he somewhat overreacted but due to the fact that his previous issue led to amputation he is very cautious these days I explained that he did the right thing. With that being said he has been tolerating the dressing changes without complication mainly he just been covering this. He does not have any discomfort in secondary to neuropathy it's unlikely that he feels much. With that being said he does tell me that the issue that he had here is that he went barefoot when he knows he should not have which subsequently led to the blisters. He states is definitely not doing that anymore. His most recent hemoglobin A1c was December and registered 6.9 his ABI today was 1.1. 03/27/17 on evaluation today patient appears to be doing great in regard to his right great toe ulcer. He has been tolerating the dressing changes without complication. The good news is this is making excellent progress and he seems to be caring for this in an excellent fashion. I see no evidence of breakdown that would make me concerned that he was at risk for  infection/amputation. This has obviously been his concern due to the fifth toe amputation that was necessitated previous when he had a similar issue. Nonetheless this seems to be progressing much more nicely. Readmission: 03/13/2019 upon evaluation today patient presents for reevaluation here in the clinic concerning issues he has been having with his right foot on the lateral portion at the proximal end of the metatarsal. Subsequently he tells me that this just opened up in the past couple of days or so and the erythema really began about 24 to 48 hours ago. Fortunately there is no signs of systemic infection. 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 That left him with this bony prominence that is where the wound is currently. There is erythema surrounding that does have me concerned about cellulitis. With that being said he was noncompressible as far as ABIs are concerned I do think we can need to check into arterial studies at this point. Patient does have a history of hypertension along with diabetes mellitus type 2. He also  tends to develop quite a bit of callus white often. Patient History Information obtained from Patient. Allergies No Known Allergies Family History Cancer - Father, Diabetes - Father, Hypertension - Father, No family history of Heart Disease, Hereditary Spherocytosis, Kidney Disease, Lung Disease, Seizures, Stroke, Thyroid Problems, Tuberculosis. Social History Never smoker, Marital Status - Married, Alcohol Use - Never, Drug Use - No History, Caffeine Use - Daily. Medical History Eyes Denies history of Cataracts, Glaucoma, Optic Neuritis Ear/Nose/Mouth/Throat Denies history of Chronic sinus problems/congestion, Middle ear problems Hematologic/Lymphatic Denies history of Anemia, Hemophilia, Human Immunodeficiency Virus, Lymphedema, Sickle Cell Disease Respiratory Denies history of Aspiration, Asthma, Chronic  Obstructive Pulmonary Disease (COPD), Pneumothorax, Sleep Apnea, Tuberculosis Cardiovascular Patient has history of Hypertension Denies history of Angina, Arrhythmia, Congestive Heart Failure, Coronary Artery Disease, Myocardial Infarction, Peripheral Arterial Disease, Peripheral Venous Disease, Phlebitis, Vasculitis Gastrointestinal Denies history of Cirrhosis , Colitis, Crohn s, Hepatitis A, Hepatitis B, Hepatitis C Endocrine Patient has history of Type II Diabetes Genitourinary Denies history of End Stage Renal Disease Immunological Denies history of Lupus Erythematosus, Raynaud s, Scleroderma Integumentary (Skin) Denies history of History of Burn, History of pressure wounds Musculoskeletal [name], [name] ([id number]) Denies history of Gout, Rheumatoid Arthritis, Osteoarthritis, Osteomyelitis Neurologic Patient has history of Neuropathy Oncologic Denies history of Received Chemotherapy, Received Radiation Psychiatric Denies history of Anorexia/bulimia, Confinement Anxiety Medical And Surgical History Notes Musculoskeletal right 5th toe amputation 2017 Review of Systems (ROS) Constitutional Symptoms (General Health) Denies complaints or symptoms of Fatigue, Fever, Chills, Marked Weight Change. Eyes Denies complaints or symptoms of Dry Eyes, Vision Changes, Glasses / Contacts. Ear/Nose/Mouth/Throat Denies complaints or symptoms of Difficult clearing ears, Sinusitis. Hematologic/Lymphatic Denies complaints or symptoms of Bleeding / Clotting Disorders, Human Immunodeficiency Virus. Respiratory Denies complaints or symptoms of Chronic or frequent coughs, Shortness of Breath. Cardiovascular Denies complaints or symptoms of Chest pain, LE edema. Gastrointestinal Denies complaints or symptoms of Frequent diarrhea, Nausea, Vomiting. Objective Constitutional sitting or standing blood pressure is within target range for patient.. pulse regular and within target range for  patient.Marland Kitchen respirations regular, non-labored and within target range for patient.Marland Kitchen temperature within target range for patient.. Well-nourished and well-hydrated in no acute distress. Vitals Time Taken: 9:59 AM, Height: 74 in, Source: Stated, Weight: 308 lbs, Source: Measured, BMI: 39.5, Temperature: 98.1 F, Pulse: 92 bpm, Respiratory Rate: 18 breaths/min, Blood Pressure: 168/62 mmHg. Eyes conjunctiva clear no eyelid edema noted. pupils equal round and reactive to light and accommodation. Ears, Nose, Mouth, and Throat no gross abnormality of ear auricles or external auditory canals. normal hearing noted during conversation. mucus membranes moist. Respiratory normal breathing without difficulty. Cardiovascular 2+ dorsalis pedis/posterior tibialis pulses. 2+ pitting edema of the bilateral lower extremities. Musculoskeletal normal gait and posture. no significant deformity or arthritic changes, no loss or range of motion, no clubbing. Psychiatric this patient is able to make decisions and demonstrates good insight into disease process. Alert and Oriented x 3. pleasant and cooperative. General Notes: Upon inspection patient's wound bed actually showed signs of poor tissue in the central portion of the wound. In fact this appears to be more of a pressure/friction type injury with necrotic tissue have been broken down as a result of this. He did recently get new shoes which may have played a role in the breakdown here. With that being said unfortunately this is also somewhat tender he does seem to have some signs of infection based on what I am seeing currently. He also has significant edema on the lower extremity  2+ pitting. Hopefully the infection is limited to just cellulitis I think we can definitely need to address that today however. I did perform a very mild sharp debridement to remove some of the necrotic debris I did not want to get too aggressive until we confirm arterial flow. [name], [name] ([id number]) Integumentary (Hair, Skin) Wound #4 status is Open. Original cause of wound was Gradually Appeared. The wound is located on the Right,Lateral Foot. The wound measures 0.9cm length x 0.9cm width x 0.2cm depth; 0.636cm^2 area and 0.127cm^3 volume. There is Fat Layer (Subcutaneous Tissue) Exposed exposed. There is no tunneling or undermining noted. There is a medium amount of serous drainage noted. The wound margin is flat and intact. There is no granulation within the wound bed. There is a large (67-100%) amount of necrotic tissue within the wound bed including Eschar and Adherent Slough. Assessment Active Problems ICD-10 Type 2 diabetes mellitus with foot ulcer 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d Corns and callosities Essential (primary) hypertension Acquired absence of other left toe(s) Procedures Wound #4 Pre-procedure diagnosis of Wound #4 is a Diabetic Wound/Ulcer of the Lower Extremity located on the Right,Lateral Foot .Severity of Tissue Pre Debridement is: Fat layer exposed. There was a Excisional Skin/Subcutaneous Tissue Debridement with a total area of 0.81 sq cm performed by STONE III, Yusuf Yu E., PA-C. With the following instrument(s): Forceps, and Scissors to remove Viable and Non-Viable tissue/material. Material removed includes Callus, Subcutaneous Tissue, Slough, and Skin: Dermis after achieving pain control using Lidocaine. A time out was conducted at 10:33, prior to the start of the procedure. A Minimum amount of bleeding was controlled with Pressure. The procedure was tolerated well. Post Debridement Measurements: 0.9cm length x 0.9cm width x 0.2cm depth; 0.127cm^3 volume. Character of Wound/Ulcer Post Debridement is stable. Severity of Tissue Post Debridement is: Fat layer exposed. Post procedure Diagnosis Wound #4: Same as Pre-Procedure Plan Wound Cleansing: Wound #4 Right,Lateral Foot: Clean wound with Normal  Saline. - in office Dial antibacterial soap, wash wounds, rinse and pat dry prior to dressing wounds Primary Wound Dressing: Wound #4 Right,Lateral Foot: Santyl Ointment Secondary Dressing: Wound #4 Right,Lateral Foot: Gauze and Kerlix/Conform Dressing Change Frequency: Wound #4 Right,Lateral Foot: Change dressing every day. Off-Loading: Wound #4 Right,Lateral Foot: Open toe surgical shoe with peg assist. Radiology ordered were: X-ray, foot - right foot Consults ordered were: Vascular - ABI TBI The following medication(s) was prescribed: Bactrim DS oral 800 mg-160 mg tablet 1 1 tablet oral taken 2 times per day for 10 days. starting 03/13/2019 Santyl topical 250 unit/gram ointment ointment topical Apply nickel thick to the wound bed and then cover with a dressing as directed in clinic. starting 03/13/2019 [name], [name]. ([id number]) 1. I am to recommend at this time that we go ahead and initiate treatment with Santyl I think that is can be the most appropriate option for the time being. 2. I am also can recommend currently that we have the patient use a postop surgical shoe in order to prevent pressure to this area we will use a peg assist in this as well. 3. I am also going to recommend that we pad the area well in order to prevent further breakdown. The surgical shoe should be beneficial. 4. I am also can send the patient for an x-ray of his foot to evaluate for any signs or evidence of osteomyelitis. Hopefully we will not see any issues with in that regard. We will  see patient back for reevaluation in 1 week here in the clinic. If anything worsens or changes patient will contact our office for additional recommendations. Electronic Signature(s) Signed: 03/13/2019 1:28:38 PM By: [name] PA-C Entered By: [name] on 03/13/2019 13:28:37 [name] ([id number]) -------------------------------------------------------------------------------- ROS/PFSH  Details Patient Name: [name] Date of Service: 03/13/2019 9:45 AM Medical Record Number: [id number] Patient Account Number: [id number] Date of Birth/Sex: [birth year] (54 y.o. M) Treating RN: Montey Hora Primary Care Provider: [name] Other Clinician: Referring Provider: Referral, Self Treating Provider/Extender: STONE III, Audriana Aldama Weeks in Treatment: 0 Information Obtained From Patient Constitutional Symptoms (General Health) Complaints and Symptoms: Negative for: Fatigue; Fever; Chills; Marked Weight Change Eyes Complaints and Symptoms: Negative for: Dry Eyes; Vision Changes; Glasses / Contacts Medical History: Negative for: Cataracts; Glaucoma; Optic Neuritis Ear/Nose/Mouth/Throat Complaints and Symptoms: Negative for: Difficult clearing ears; Sinusitis Medical History: Negative for: Chronic sinus problems/congestion; Middle ear problems Hematologic/Lymphatic Complaints and Symptoms: Negative for: Bleeding / Clotting Disorders; Human Immunodeficiency Virus Medical History: Negative for: Anemia; Hemophilia; Human Immunodeficiency Virus; Lymphedema; Sickle Cell Disease Respiratory Complaints and Symptoms: Negative for: Chronic or frequent coughs; Shortness of Breath Medical History: Negative for: Aspiration; Asthma; Chronic Obstructive Pulmonary Disease (COPD); Pneumothorax; Sleep Apnea; Tuberculosis Cardiovascular Complaints and Symptoms: Negative for: Chest pain; LE edema Medical History: Positive for: Hypertension Negative for: Angina; Arrhythmia; Congestive Heart Failure; Coronary Artery Disease; Myocardial Infarction; Peripheral Arterial Disease; Peripheral Venous Disease; Phlebitis; Vasculitis Gastrointestinal Complaints and Symptoms: Negative for: Frequent diarrhea; Nausea; Vomiting Medical History: Negative for: Cirrhosis ; Colitis; Crohnos; Hepatitis A; Hepatitis B; Hepatitis C Endocrine [name], [name] ([id number]) Medical  History: Positive for: Type II Diabetes Time with diabetes: 15 years Treated with: Insulin, Oral agents Blood sugar tested every day: No Blood sugar testing results: Breakfast: 141 Genitourinary Medical History: Negative for: End Stage Renal Disease Immunological Medical History: Negative for: Lupus Erythematosus; Raynaudos; Scleroderma Integumentary (Skin) Medical History: Negative for: History of Burn; History of pressure wounds Musculoskeletal Medical History: Negative for: Gout; Rheumatoid Arthritis; Osteoarthritis; Osteomyelitis Past Medical History Notes: right 5th toe amputation 2017 Neurologic Medical History: Positive for: Neuropathy Oncologic Medical History: Negative for: Received Chemotherapy; Received Radiation Psychiatric Medical History: Negative for: Anorexia/bulimia; Confinement Anxiety Immunizations Pneumococcal Vaccine: Received Pneumococcal Vaccination: No Implantable Devices None Family and Social History Cancer: Yes - Father; Diabetes: Yes - Father; Heart Disease: No; Hereditary Spherocytosis: No; Hypertension: Yes - Father; Kidney Disease: No; Lung Disease: No; Seizures: No; Stroke: No; Thyroid Problems: No; Tuberculosis: No; Never smoker; Marital Status - Married; Alcohol Use: Never; Drug Use: No History; Caffeine Use: Daily; Financial Concerns: No; Food, Clothing or Shelter Needs: No; Support System Lacking: No; Transportation Concerns: No Electronic Signature(s) Signed: 03/13/2019 4:05:00 PM By: Montey Hora Signed: 03/14/2019 4:19:26 PM By: [name] PA-C Entered By: Montey Hora on 03/13/2019 10:07:46 [name] ([id number]) -------------------------------------------------------------------------------- SuperBill Details Patient Name: [name] Date of Service: 03/13/2019 Medical Record Number: [id number] Patient Account Number: [id number] Date of Birth/Sex: April 18, 1965 (54 y.o. M) Treating RN: [name] Primary Care Provider: [name] Other Clinician: Referring Provider: Referral, Self Treating Provider/Extender: Melburn Hake, Adrick Kestler Weeks in Treatment: 0 Diagnosis Coding ICD-10 Codes Code Description E11.621 Type 2 diabetes mellitus with foot ulcer L97.512 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d L84 Corns and callosities I10 Essential (primary) hypertension Z89.422 Acquired absence of other left toe(s) Facility Procedures CPT4 Code: [id number] Description: 99213 - WOUND CARE VISIT-LEV 3 EST PT  Modifier: Quantity: 1 CPT4 Code: [id number] Description: [id number] - DEB SUBQ TISSUE 20 SQ CM/< Modifier: Quantity: 1 CPT4 Code: Description: ICD-10 Diagnosis Description L97.512 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 Modifier: d Quantity: Physician Procedures CPT4 Code: [id number] Description: [id number] - WC PHYS LEVEL 4 - EST PT Modifier: 25 Quantity: 1 CPT4 Code: Description: ICD-10 Diagnosis Description E11.621 Type 2 diabetes mellitus with foot ulcer L97.512 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

## 2019-03-17 ENCOUNTER — Other Ambulatory Visit (INDEPENDENT_AMBULATORY_CARE_PROVIDER_SITE_OTHER): Payer: Self-pay | Admitting: Physician Assistant

## 2019-03-17 DIAGNOSIS — L97522 Non-pressure chronic ulcer of other part of left foot with fat layer exposed: Secondary | ICD-10-CM

## 2019-03-18 ENCOUNTER — Other Ambulatory Visit (INDEPENDENT_AMBULATORY_CARE_PROVIDER_SITE_OTHER): Payer: Self-pay | Admitting: Physician Assistant

## 2019-03-18 DIAGNOSIS — E13621 Other specified diabetes mellitus with foot ulcer: Secondary | ICD-10-CM

## 2019-03-18 DIAGNOSIS — L97512 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d: Secondary | ICD-10-CM

## 2019-03-19 ENCOUNTER — Ambulatory Visit (INDEPENDENT_AMBULATORY_CARE_PROVIDER_SITE_OTHER): Payer: Managed Care, Other (non HMO)

## 2019-03-19 ENCOUNTER — Other Ambulatory Visit: Payer: Self-pay

## 2019-03-19 DIAGNOSIS — L97509 Non-pressure chronic ulcer of other part of unspecified foot with unspecified severity: Secondary | ICD-10-CM

## 2019-03-19 DIAGNOSIS — L97512 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d: Secondary | ICD-10-CM

## 2019-03-19 DIAGNOSIS — E13621 Other specified diabetes mellitus with foot ulcer: Secondary | ICD-10-CM

## 2019-03-20 ENCOUNTER — Encounter: Payer: Managed Care, Other (non HMO) | Admitting: Physician Assistant

## 2019-03-20 DIAGNOSIS — L97512 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d: Secondary | ICD-10-CM | POA: Diagnosis not present

## 2019-03-20 DIAGNOSIS — E1140 Type 2 diabetes mellitus with diabetic neuropathy, unspecified: Secondary | ICD-10-CM | POA: Diagnosis not present

## 2019-03-20 DIAGNOSIS — L84 Corns and callosities: Secondary | ICD-10-CM | POA: Diagnosis not present

## 2019-03-20 DIAGNOSIS — I10 Essential (primary) hypertension: Secondary | ICD-10-CM | POA: Diagnosis not present

## 2019-03-20 DIAGNOSIS — E11621 Type 2 diabetes mellitus with foot ulcer: Secondary | ICD-10-CM | POA: Diagnosis present

## 2019-03-20 NOTE — Progress Notes (Addendum)
HIROMU, ADLER ([id number]) Visit Report for 03/20/2019 Arrival Information Details Patient Name: [name], [name]. Date of Service: 03/20/2019 10:30 AM Medical Record Number: [id number] Patient Account Number: [id number] Date of Birth/Sex: 1965/10/30 (54 y.o. M) Treating RN: Cornell Barman Primary Care Sally Reimers: [name] Other Clinician: Referring Axtyn Woehler: [name] Treating Jacari Kirsten/Extender: Melburn Hake, HOYT Weeks in Treatment: 1 Visit Information History Since Last Visit Added or deleted any medications: No Patient Arrived: Ambulatory Has Dressing in Place as Prescribed: Yes Arrival Time: 10:34 Pain Present Now: No Accompanied By: self Transfer Assistance: None Patient Identification Verified: Yes Secondary Verification Process Completed: Yes Patient Has Alerts: Yes Patient Alerts: DMII Electronic Signature(s) Signed: 03/20/2019 3:52:07 PM By: Lorine Bears RCP, RRT, CHT Entered By: Lorine Bears on 03/20/2019 10:35:15 [name] ([id number]) -------------------------------------------------------------------------------- Encounter Discharge Information Details Patient Name: [name] Date of Service: 03/20/2019 10:30 AM Medical Record Number: [id number] Patient Account Number: [id number] Date of Birth/Sex: 08-25-1965 (54 y.o. M) Treating RN: Cornell Barman Primary Care Trine Fread: [name] Other Clinician: Referring Trueman Worlds: [name] Treating Stephenia Vogan/Extender: Melburn Hake, HOYT Weeks in Treatment: 1 Encounter Discharge Information Items Post Procedure Vitals Discharge Condition: Stable Temperature (F): 97.8 Ambulatory Status: Ambulatory Pulse (bpm): 79 Discharge Destination: Home Respiratory Rate (breaths/min): 16 Transportation: Private Auto Blood Pressure (mmHg): 137/57 Accompanied By: self Schedule Follow-up Appointment: Yes Clinical Summary of Care: Electronic Signature(s) Signed:  03/21/2019 5:21:44 PM By: Gretta Cool, BSN, RN, CWS, Kim RN, BSN Entered By: Gretta Cool, BSN, RN, CWS, Kim on 03/20/2019 11:04:33 [name] ([id number]) -------------------------------------------------------------------------------- Lower Extremity Assessment Details Patient Name: [name], [name]. Date of Service: 03/20/2019 10:30 AM Medical Record Number: [id number] Patient Account Number: [id number] Date of Birth/Sex: [birth year] (54 y.o. M) Treating RN: Montey Hora Primary Care Mackensie Pilson: [name] Other Clinician: Referring Kathrene Sinopoli: [name] Treating Kainen Struckman/Extender: STONE III, HOYT Weeks in Treatment: 1 Edema Assessment Assessed: [Left: No] [Right: No] Edema: [Left: Ye] [Right: s] Vascular Assessment Pulses: Dorsalis Pedis Palpable: [Right:Yes] Blood Pressure: Brachial: [Right:153] Dorsalis Pedis: 157 [Left:Dorsalis Pedis: [id number] Ankle: Posterior Tibial: 163 [Left:Posterior Tibial: 167 1.07] [Right:1.09] Notes ABI from [facility] 03/19/19 with TBI R .95 and L .88 Electronic Signature(s) Signed: 03/20/2019 4:33:07 PM By: Montey Hora Previous Signature: 03/20/2019 8:34:12 AM Version By: Montey Hora Entered By: Montey Hora on 03/20/2019 10:50:51 [name] ([id number]) -------------------------------------------------------------------------------- Multi Wound Chart Details Patient Name: [name] Date of Service: 03/20/2019 10:30 AM Medical Record Number: [id number] Patient Account Number: [id number] Date of Birth/Sex: [birth year] (54 y.o. M) Treating RN: Cornell Barman Primary Care Tenishia Ekman: [name] Other Clinician: Referring Jaxx Huish: [name] Treating Madison Albea/Extender: Melburn Hake, HOYT Weeks in Treatment: 1 Vital Signs Height(in): 74 Pulse(bpm): 79 Weight(lbs): 308 Blood Pressure(mmHg): 137/57 Body Mass Index(BMI): 40 Temperature(F): 97.7 Respiratory Rate(breaths/min): 16 Photos: [N/A:N/A] Wound Location:  Right Foot - Lateral N/A N/A Wounding Event: Gradually Appeared N/A N/A Primary Etiology: Diabetic Wound/Ulcer of the Lower N/A N/A Extremity Comorbid History: Hypertension, Type II Diabetes, N/A N/A Neuropathy Date Acquired: 03/12/2019 N/A N/A Weeks of Treatment: 1 N/A N/A Wound Status: Open N/A N/A Pending Amputation on Yes N/A N/A Presentation: Measurements L x W x D (cm) 1.2x0.9x0.2 N/A N/A Area (cm) : 0.848 N/A N/A Volume (cm) : 0.17 N/A N/A % Reduction in Area: -33.30% N/A N/A % Reduction in Volume: -33.90% N/A N/A Classification: Grade 1 N/A N/A Exudate Amount: Medium N/A N/A Exudate Type: Serous N/A N/A Exudate Color: amber N/A N/A Wound Margin: Flat and Intact N/A N/A Granulation Amount: None Present (  0%) N/A N/A Necrotic Amount: Large (67-100%) N/A N/A Exposed Structures: Fat Layer (Subcutaneous Tissue) N/A N/A Exposed: Yes Fascia: No Tendon: No Muscle: No Joint: No Bone: No Epithelialization: None N/A N/A Treatment Notes Electronic Signature(s) Signed: 03/21/2019 5:21:44 PM By: Gretta Cool, BSN, RN, CWS, Kim RN, BSN [address], Gerarda Fraction ([id number]) Entered By: Gretta Cool, BSN, RN, CWS, Kim on 03/20/2019 11:00:58 [name] ([id number]) -------------------------------------------------------------------------------- Multi-Disciplinary Care Plan Details Patient Name: [name], [name]. Date of Service: 03/20/2019 10:30 AM Medical Record Number: [id number] Patient Account Number: [id number] Date of Birth/Sex: 08/07/1965 (54 y.o. M) Treating RN: Cornell Barman Primary Care Jacai Kipp: [name] Other Clinician: Referring Kent Riendeau: [name] Treating Claiborne Stroble/Extender: Melburn Hake, HOYT Weeks in Treatment: 1 Active Inactive Nutrition Nursing Diagnoses: Impaired glucose control: actual or potential Goals: Patient/caregiver verbalizes understanding of need to maintain therapeutic glucose control per primary care physician Date Initiated:  03/13/2019 Target Resolution Date: 04/11/2019 Goal Status: Active Interventions: Assess patient nutrition upon admission and as needed per policy Notes: Orientation to the Wound Care Program Nursing Diagnoses: Knowledge deficit related to the wound healing center program Goals: Patient/caregiver will verbalize understanding of the [place] Date Initiated: 03/13/2019 Target Resolution Date: 04/11/2019 Goal Status: Active Interventions: Provide education on orientation to the wound center Notes: Wound/Skin Impairment Nursing Diagnoses: Impaired tissue integrity Goals: Ulcer/skin breakdown will have a volume reduction of 30% by week 4 Date Initiated: 03/13/2019 Target Resolution Date: 04/11/2019 Goal Status: Active Interventions: Assess ulceration(s) every visit Notes: Electronic Signature(s) Signed: 03/21/2019 5:21:44 PM By: Gretta Cool, BSN, RN, CWS, Kim RN, BSN Entered By: Gretta Cool, BSN, RN, CWS, Kim on 03/20/2019 11:00:50 DEYVION, DUNDEE ([id number], GALLER ([id number]) -------------------------------------------------------------------------------- Pain Assessment Details Patient Name: [name], [name]. Date of Service: 03/20/2019 10:30 AM Medical Record Number: [id number] Patient Account Number: [id number] Date of Birth/Sex: 04-04-1965 (54 y.o. M) Treating RN: Montey Hora Primary Care Alanie Syler: [name] Other Clinician: Referring Kadrian Partch: [name] Treating Willadene Mounsey/Extender: Melburn Hake, HOYT Weeks in Treatment: 1 Active Problems Location of Pain Severity and Description of Pain Patient Has Paino No Site Locations Pain Management and Medication Current Pain Management: Electronic Signature(s) Signed: 03/20/2019 4:33:07 PM By: Montey Hora Entered By: Montey Hora on 03/20/2019 10:45:29 [name] ([id number]) -------------------------------------------------------------------------------- Patient/Caregiver  Education Details Patient Name: [name] Date of Service: 03/20/2019 10:30 AM Medical Record Number: [id number] Patient Account Number: [id number] Date of Birth/Gender: June 16, 1965 (54 y.o. M) Treating RN: Cornell Barman Primary Care Physician: [name] Other Clinician: Referring Physician: [name] Treating Physician/Extender: Sharalyn Ink in Treatment: 1 Education Assessment Education Provided To: Patient Education Topics Provided Pressure: Handouts: Pressure Ulcers: Care and Offloading Methods: Demonstration, Explain/Verbal Responses: State content correctly Wound/Skin Impairment: Handouts: Caring for Your Ulcer Methods: Demonstration, Explain/Verbal Responses: State content correctly Electronic Signature(s) Signed: 03/21/2019 5:21:44 PM By: Gretta Cool, BSN, RN, CWS, Kim RN, BSN Entered By: Gretta Cool, BSN, RN, CWS, Kim on 03/20/2019 11:03:40 [name] ([id number]) -------------------------------------------------------------------------------- Wound Assessment Details Patient Name: [name], [name]. Date of Service: 03/20/2019 10:30 AM Medical Record Number: [id number] Patient Account Number: [id number] Date of Birth/Sex: 1965/04/17 (54 y.o. M) Treating RN: Montey Hora Primary Care Dresden Lozito: [name] Other Clinician: Referring Darrielle Pflieger: [name] Treating Leshaun Biebel/Extender: STONE III, HOYT Weeks in Treatment: 1 Wound Status Wound Number: 4 Primary Etiology: Diabetic Wound/Ulcer of the Lower Extremity Wound Location: Right Foot - Lateral Wound Status: Open Wounding Event: Gradually Appeared Comorbid History: Hypertension, Type II Diabetes, Neuropathy Date Acquired: 03/12/2019 Weeks Of Treatment: 1 Clustered Wound:  No Pending Amputation On Presentation Photos Wound Measurements Length: (cm) 1.2 % Reduc Width: (cm) 0.9 % Reduc Depth: (cm) 0.2 Epithel Area: (cm) 0.848 Tunnel Volume: (cm) 0.17 Underm tion in  Area: -33.3% tion in Volume: -33.9% ialization: None ing: No ining: No Wound Description Classification: Grade 1 Foul Od Wound Margin: Flat and Intact Slough/ Exudate Amount: Medium Exudate Type: Serous Exudate Color: amber or After Cleansing: No Fibrino Yes Wound Bed Granulation Amount: None Present (0%) Exposed Structure Necrotic Amount: Large (67-100%) Fascia Exposed: No Necrotic Quality: Adherent Slough Fat Layer (Subcutaneous Tissue) Exposed: Yes Tendon Exposed: No Muscle Exposed: No Joint Exposed: No Bone Exposed: No Treatment Notes Wound #4 (Right, Lateral Foot) Notes Santyl, gauze, conform Electronic Signature(s) TAFARI, CHUNG ([id number]) Signed: 03/20/2019 4:33:07 PM By: Montey Hora Entered By: Montey Hora on 03/20/2019 10:49:52 [name] ([id number]) -------------------------------------------------------------------------------- Vitals Details Patient Name: [name] Date of Service: 03/20/2019 10:30 AM Medical Record Number: [id number] Patient Account Number: [id number] Date of Birth/Sex: 1965-02-02 (54 y.o. M) Treating RN: Cornell Barman Primary Care Timya Trimmer: [name] Other Clinician: Referring Aneesa Romey: [name] Treating Rayleigh Gillyard/Extender: Melburn Hake, HOYT Weeks in Treatment: 1 Vital Signs Time Taken: 10:30 Temperature (F): 97.7 Height (in): 74 Pulse (bpm): 79 Weight (lbs): 308 Respiratory Rate (breaths/min): 16 Body Mass Index (BMI): 39.5 Blood Pressure (mmHg): 137/57 Reference Range: 80 - 120 mg / dl Electronic Signature(s) Signed: 03/20/2019 3:52:07 PM By: Lorine Bears RCP, RRT, CHT Entered By: Lorine Bears on 03/20/2019 10:35:44

## 2019-03-20 NOTE — Progress Notes (Addendum)
[name], [name] ([id number]) Visit Report for 03/20/2019 Chief Complaint Document Details Patient Name: [name], [name]. Date of Service: 03/20/2019 10:30 AM Medical Record Number: [id number] Patient Account Number: [id number] Date of Birth/Sex: 26-Jan-1965 (54 y.o. M) Treating RN: Cornell Barman Primary Care Provider: [name] Other Clinician: Referring Provider: [name] Treating Provider/Extender: Melburn Hake, Yuliana Vandrunen Weeks in Treatment: 1 Information Obtained from: Patient Chief Complaint Right later foot ulcer Electronic Signature(s) Signed: 03/20/2019 10:42:05 AM By: [name] PA-C Entered By: [name] on 03/20/2019 10:42:04 [name] ([id number]) -------------------------------------------------------------------------------- Debridement Details Patient Name: [name] Date of Service: 03/20/2019 10:30 AM Medical Record Number: [id number] Patient Account Number: [id number] Date of Birth/Sex: September 14, 1965 (54 y.o. M) Treating RN: Cornell Barman Primary Care Provider: [name] Other Clinician: Referring Provider: [name] Treating Provider/Extender: Melburn Hake, Orabelle Rylee Weeks in Treatment: 1 Debridement Performed for Wound #4 Right,Lateral Foot Assessment: Performed By: Physician STONE III, Jaleea Alesi E., PA-C Debridement Type: Debridement Severity of Tissue Pre Debridement: Fat layer exposed Level of Consciousness (Pre- Awake and Alert procedure): Pre-procedure Verification/Time Out Yes - 11:01 Taken: Start Time: 11:01 Pain Control: Lidocaine Total Area Debrided (L x W): 1.2 (cm) x 0.9 (cm) = 1.08 (cm) Tissue and other material debrided: Viable, Non-Viable, Slough, Subcutaneous, Slough Level: Skin/Subcutaneous Tissue Debridement Description: Excisional Instrument: Curette Bleeding: Minimum Hemostasis Achieved: Pressure End Time: 11:03 Response to Treatment: Procedure was tolerated well Level of Consciousness  (Post- Awake and Alert procedure): Post Debridement Measurements of Total Wound Length: (cm) 1.2 Width: (cm) 0.9 Depth: (cm) 0.3 Volume: (cm) 0.254 Character of Wound/Ulcer Post Debridement: Stable Severity of Tissue Post Debridement: Fat layer exposed Post Procedure Diagnosis Same as Pre-procedure Electronic Signature(s) Signed: 03/20/2019 5:16:18 PM By: [name] PA-C Signed: 03/21/2019 5:21:44 PM By: Gretta Cool, BSN, RN, CWS, Kim RN, BSN Entered By: Gretta Cool, BSN, RN, CWS, Kim on 03/20/2019 11:02:29 [name] ([id number]) -------------------------------------------------------------------------------- HPI Details Patient Name: [name] Date of Service: 03/20/2019 10:30 AM Medical Record Number: [id number] Patient Account Number: [id number] Date of Birth/Sex: 1965-01-25 (54 y.o. M) Treating RN: Cornell Barman Primary Care Provider: [name] Other Clinician: Referring Provider: [name] Treating Provider/Extender: Melburn Hake, Tonilynn Bieker Weeks in Treatment: 1 History of Present Illness HPI Description: This 54 year old male comes with an ulcerated area on the plantar aspect of the right foot which she's had for approximately a month. I have known him from a previous visit at [facility] wound center and was treated in the months of April and May 2016 and rapidly healed a left plantar ulcer with a total contact cast. He has been a diabetic for about 16 years and tries to keep active and is fairly compliant with his diabetes management. 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 Past medical history significant for hypertension, hyperlipidemia, and status post appendectomy 1993. He does not smoke or drink alcohol. 01/14/2015 -- the patient had tolerated his total contact cast very well and had no problems and has had no systemic symptoms. However when his total contact cast was cut open he had excessive amount of purulent drainage in spite of being on  antibiotics. He had had a recent x-ray done in the ER 12/22/2014 which showed IMPRESSION:No evidence of osseous erosion. Known soft tissue ulceration is not well characterized on radiograph. Scattered vascular calcifications seen. his last hemoglobin A1c in December was 7.3. He has been on Augmentin and doxycycline for the last 2 weeks. 01/21/2015 -- his culture grew rare growth of Pantoea species an  MR moderate growth of Candida parapsilosis. it is sensitive to levofloxacin. He has not heard back from the insurance company regarding his hyperbaric oxygen therapy. His MRI has not been done yet and we will try and get him an earlier date 01/28/2015 -- MRI was done last night -- IMPRESSION:1. Soft tissue ulcer overlying the plantar aspect of the fifth metatarsal head extending to the cortex. Subcortical marrow edema in the fifth metatarsal head with corresponding T1 hypointensity is concerning for early osteomyelitis of the plantar lateral aspect of the fifth metatarsal head. Chest x-ray done on 01/14/2015 shows bronchiectatic changes without infiltrate. EKG done on generally 17 2017 shows a normal sinus rhythm and is a normal EKG. 02/04/2015 -- he was asked to see Dr. [name] last week and had 2 appointments but had to cancel both due to pressures of work. Last night he has woken up with severe pain in the foot and leg and it is swollen up. No fever or no change in his blood glucose. Addendum: I spoke with Dr. [name] who kindly agreed to accept the patient for inpatient therapy and have also opened to the hospitalist Dr. [name], and discuss details of the management including PICC line and repeat cultures. 02/12/2015-- -- was seen by Dr. [name] in the hospital and a PICC line was placed. He was to receive Ceftazidime 2 g every 12 hourly, oral levofloxacin 750 mg every 24 hourly and oral fluconazole 200 mg daily. The antibiotics were to be given for 4 weeks except the Diflucan was to  be given for the first 2 weeks. Reviewed note from 02/10/2015 -- and Dr. [name] had recommended management for growth of MSSA and Serratia. He switched him from ceftazidime to ceftriaxone 2 g every 24 hours. Levofloxacin was stopped and he would continue on fluconazole for another week. He had asked me to decide whether further imaging was necessary and whether surgical debridement of the infected bone was needed. He is doing well and has been off work for this week and we will keep him off the next week. 02/22/2015 -- he was seen by my colleague on 01/19/2015 and at that time an incision and drainage was done on his right lateral forefoot on the dorsum. Today when I probed this wound it is frankly draining pus and it communicates with the ulcer on the plantar aspect of his right foot. The patient is still on IV ceftriaxone 2 g every 24 hours and is to be seen by Dr. [name] on Friday. 03/19/2015 -- On 03/04/2015 I spoke to Dr. 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 He recommended operative debridement which would probably result in the fifth metatarsal head and toe amputation.The patient would be referred back to [place] once he was done with surgery. He was admitted to [facility] yesterday and had surgery done by podiatry for a right fifth metatarsal acute osteomyelitis with cellulitis and abscess. He had a right foot incision and drainage with fifth metatarsal partial amputation and removal of toe infected bone and soft tissue with cultures. The wound was partially closed and packing of the distal end was done. Patient was already on cefepime 2 g IV every 8 hourly and put on vancomycin pending final cultures. He had grown moderate gram-negative rods, and later found to be rare diphtheroids. I received  a call from Dr. Cannon Kettle  the podiatrist and we discussed the above. On 03/10/2015 he was found positive for influenza a and has been put on Tamiflu. Since his discharge he has been seen by Dr. Cannon Kettle who is planning to remove his sutures this coming week. He was reviewed by Dr. [name] on 03/17/2015 and his Diflucan was stopped and vancomycin. After the 3 doses he is taking. He is going to change Ceftazidime to Zosyn 3.375 g IV every 8 hours. 03/25/2015 -- he was seen by the podiatrist a couple of days ago and the sutures were removed. She will follow back with him in 4 weeks' time and at that time x-rays will be taken and a custom molded insert would be made for his shoe. 04/01/2015 -- he was seen by Dr. [name] on 03/29/2015 who pulled the PICC line stopped his IV antibiotics and recommended starting doxycycline [name], [name]. ([id number]) and levofloxacin for 2 weeks. He also stop the fluconazole. The patient will follow up with him only when necessary. Readmission: 03/15/17 on evaluation today patient presents for initial evaluation concerning the new issues although he has previously been evaluated in our clinic. Unfortunately the previous evaluation led to the patient having to proceed to amputation and so he is somewhat nervous about being here today. With that being said he has a very slight blister that occurred on the plantar aspect more medial on the right great toe that has been present for just a very short amount of time, several days. With that being said he felt like initially when he called that he somewhat overreacted but due to the fact that his previous issue led to amputation he is very cautious these days I explained that he did the right thing. With that being said he has been tolerating the dressing changes without complication mainly he just been covering this. He does not have any discomfort in secondary to neuropathy it's unlikely that he feels much. With that being said he does tell me  that the issue that he had here is that he went barefoot when he knows he should not have which subsequently led to the blisters. He states is definitely not doing that anymore. His most recent hemoglobin A1c was December and registered 6.9 his ABI today was 1.1. 03/27/17 on evaluation today patient appears to be doing great in regard to his right great toe ulcer. He has been tolerating the dressing changes without complication. The good news is this is making excellent progress and he seems to be caring for this in an excellent fashion. I see no evidence of breakdown that would make me concerned that he was at risk for infection/amputation. This has obviously been his concern due to the fifth toe amputation that was necessitated previous when he had a similar issue. Nonetheless this seems to be progressing much more nicely. Readmission: 03/13/2019 upon evaluation today patient presents for reevaluation here in the clinic concerning issues he has been having with his right foot on the lateral portion at the proximal end of the metatarsal. Subsequently he tells me that this just opened up in the past couple of days or so and the erythema really began about 24 to 48 hours ago. Fortunately there is no signs of systemic infection. 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 That left him with this bony prominence that is where the wound is currently. There is erythema surrounding that does have me concerned  about cellulitis. With that being said he was noncompressible as far as ABIs are concerned I do think we can need to check into arterial studies at this point. Patient does have a history of hypertension along with diabetes mellitus type 2. He also tends to develop quite a bit of callus white often. 03/20/2019 upon evaluation today patient appears to be doing very well in regard to his wounds currently. He has been tolerating the dressing changes without  complication. Fortunately there is no signs of infection. His ABIs were good at this point and registered at 1.07 on the left and 1.09 on the right. In regard to the x-ray this was negative as well for any signs of osteomyelitis. The patient also seems to be doing better in regard to the infection. He still has several days of the antibiotic left at this point best the Bactrim DS and he seems to be doing very well with this. Overall I am extremely pleased with the progress in 1 week's time he is going require some sharp debridement today however. Electronic Signature(s) Signed: 03/20/2019 11:13:12 AM By: [name] PA-C Entered By: [name] on 03/20/2019 11:13:11 [name] ([id number]) -------------------------------------------------------------------------------- Physical Exam Details Patient Name: [name], [name] Date of Service: 03/20/2019 10:30 AM Medical Record Number: [id number] Patient Account Number: [id number] Date of Birth/Sex: April 12, 1965 (54 y.o. M) Treating RN: Cornell Barman Primary Care Provider: [name] Other Clinician: Referring Provider: [name] Treating Provider/Extender: STONE III, Daryl Quiros Weeks in Treatment: 1 Constitutional Well-nourished and well-hydrated in no acute distress. Respiratory normal breathing without difficulty. Psychiatric this patient is able to make decisions and demonstrates good insight into disease process. Alert and Oriented x 3. pleasant and cooperative. Notes Patient's wound bed currently showed necrotic tissue at the base of the wound. It does seem to be softer and I think the Annitta Needs is helping in that regard. With that being said I am not really seeing any evidence of infection worsening and in fact this appears to be much better. I did perform sharp debridement to remove necrotic tissue including slough down to some good subcutaneous tissue although in general right now this is still going require some  additional debriding I would recommend the Santyl be continued for the next week and we will obviously continue to keep an eye on this as well from the standpoint of future necessary debridements. Electronic Signature(s) Signed: 03/20/2019 11:13:59 AM By: [name] PA-C Entered By: [name] on 03/20/2019 11:13:58 [name] ([id number]) -------------------------------------------------------------------------------- Physician Orders Details Patient Name: [name] Date of Service: 03/20/2019 10:30 AM Medical Record Number: [id number] Patient Account Number: [id number] Date of Birth/Sex: 03/15/1965 (54 y.o. 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 - in office o Dial antibacterial soap, wash wounds, rinse and pat dry prior to dressing wounds Primary Wound Dressing Wound #4 Right,Lateral Foot o Santyl Ointment Secondary Dressing Wound #4 Right,Lateral Foot o Gauze and Kerlix/Conform Dressing Change Frequency Wound #4 Right,Lateral Foot o Change dressing every day. Edema Control Wound #4  Right,Lateral Foot o Patient to wear own compression stockings - wear daily Off-Loading Wound #4 Right,Lateral Foot o Open toe surgical shoe with peg assist. Electronic Signature(s) Signed: 03/20/2019 5:16:18 PM By: [name] PA-C Signed: 03/21/2019 5:21:44 PM By: Gretta Cool, BSN, RN, CWS, Kim RN, BSN Entered By:  Gretta Cool, BSN, RN, CWS, Kim on 03/20/2019 11:05:57 [name] ([id number]) -------------------------------------------------------------------------------- Problem List Details Patient Name: [name], [name]. Date of Service: 03/20/2019 10:30 AM Medical Record Number: [id number] Patient Account Number: [id number] Date of Birth/Sex: October 31, 1965 (54 y.o. 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 M) Treating RN: Cornell Barman Primary Care Provider: [name] Other Clinician: Referring Provider: [name] Treating Provider/Extender: Melburn Hake, Forrest Jaroszewski Weeks in Treatment: 1 Subjective Chief Complaint Information obtained from Patient Right later foot ulcer History of Present Illness (HPI) This 54 year old male comes with an ulcerated area on the plantar aspect of the right foot which she's had for approximately a month. I have known him  from a previous visit at [name] wound center and was treated in the months of April and May 2016 and rapidly healed a left plantar ulcer with a total contact cast. He has been a diabetic for about 16 years and tries to keep active and is fairly compliant with his diabetes management. 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 Past medical history significant for hypertension, hyperlipidemia, and status post appendectomy 1993. He does not smoke or drink alcohol. 01/14/2015 -- the patient had tolerated his total contact cast very well and had no problems and has had no systemic symptoms. However when his total contact cast was cut open he had excessive amount of purulent drainage in spite of being on antibiotics. He had had a recent x-ray done in the ER 12/22/2014 which showed IMPRESSION:No evidence of osseous erosion. Known soft tissue ulceration is not well characterized on radiograph. Scattered vascular calcifications seen. his last hemoglobin A1c in December was 7.3. He has been on Augmentin and doxycycline for the last 2 weeks. 01/21/2015 -- his culture grew rare growth of Pantoea species an MR moderate growth of Candida parapsilosis. it is sensitive to levofloxacin. He has not heard back from the insurance company regarding his hyperbaric oxygen therapy. His MRI has not been done yet and we will try and get him an earlier date 01/28/2015 -- MRI was done last night -- IMPRESSION:1. Soft tissue ulcer overlying the plantar aspect of the fifth metatarsal head extending to the cortex. Subcortical marrow edema in the fifth metatarsal head with corresponding T1 hypointensity is concerning for early osteomyelitis of the plantar lateral aspect of the fifth metatarsal head. Chest x-ray done on 01/14/2015 shows bronchiectatic changes without infiltrate. EKG done on generally 17  2017 shows a normal sinus rhythm and is a normal EKG. 02/04/2015 -- he was asked to see Dr. [name] last week and had 2  appointments but had to cancel both due to pressures of work. Last night he has woken up with severe pain in the foot and leg and it is swollen up. No fever or no change in his blood glucose. Addendum: I spoke with Dr. [name] who kindly agreed to accept the patient for inpatient therapy and have also opened to the hospitalist Dr. [name], and discuss details of the management including PICC line and repeat cultures. 02/12/2015-- -- was seen by Dr. [name] in the hospital and a PICC line was placed. He was to receive Ceftazidime 2 g every 12 hourly, oral levofloxacin 750 mg every 24 hourly and oral fluconazole 200 mg daily. The antibiotics were to be given for 4 weeks except the Diflucan was to be given for the first 2 weeks. Reviewed note from 02/10/2015 -- and Dr. [name] had recommended management for growth of MSSA and Serratia. He switched him from ceftazidime to ceftriaxone 2 g every 24 hours. Levofloxacin was stopped and he would continue on fluconazole for another week. He had asked me to decide whether further imaging was necessary and whether surgical debridement of the infected bone was needed. He is doing well and has been off work for this week and we will keep him off the next week. 02/22/2015 -- he was seen by my colleague on 01/19/2015 and at that time an incision and drainage was done on his right lateral forefoot on the dorsum. Today when I probed this wound it is frankly draining pus and it communicates with the ulcer on the plantar aspect of his right foot. The patient is still on IV ceftriaxone 2 g every 24 hours and is to be seen by Dr. [name] on Friday. 03/19/2015 -- On 03/04/2015 I spoke to Dr. 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 He recommended operative debridement which would probably result in the  fifth metatarsal head and toe amputation.The patient would be referred back to [place] once he was done with surgery. He was admitted to [facility] yesterday and had surgery done by podiatry for a right fifth metatarsal acute osteomyelitis with cellulitis and abscess. He had a right foot incision and drainage with fifth metatarsal partial amputation and removal of toe infected bone and soft tissue with cultures. The wound was partially closed and packing of the distal end was done. Patient was already on cefepime 2 g IV every 8 hourly and put on vancomycin pending final cultures. He had grown moderate gram-negative rods, and later found to be rare diphtheroids. I received a call from Dr. Cannon Kettle the podiatrist and we discussed the above. On 03/10/2015 he was found positive for influenza a and has been put on Tamiflu. Since his discharge he has been seen by Dr. Cannon Kettle who is planning to remove his sutures this coming week. He was reviewed by Dr. [name] on 03/17/2015 and his Diflucan was stopped and vancomycin. After the 3 doses he is taking. He is going to change Ceftazidime to Zosyn 3.375 g IV every 8 hours. [name], [name] ([id number]) 03/25/2015 -- he was seen by the podiatrist a couple of days ago and the sutures were removed. She will follow  back with him in 4 weeks' time and at that time x-rays will be taken and a custom molded insert would be made for his shoe. 04/01/2015 -- he was seen by Dr. [name] on 03/29/2015 who pulled the PICC line stopped his IV antibiotics and recommended starting doxycycline and levofloxacin for 2 weeks. He also stop the fluconazole. The patient will follow up with him only when necessary. Readmission: 03/15/17 on evaluation today patient presents for initial evaluation concerning the new issues although he has previously been evaluated in our clinic. Unfortunately the previous evaluation led to the patient having to proceed to amputation and so he  is somewhat nervous about being here today. With that being said he has a very slight blister that occurred on the plantar aspect more medial on the right great toe that has been present for just a very short amount of time, several days. With that being said he felt like initially when he called that he somewhat overreacted but due to the fact that his previous issue led to amputation he is very cautious these days I explained that he did the right thing. With that being said he has been tolerating the dressing changes without complication mainly he just been covering this. He does not have any discomfort in secondary to neuropathy it's unlikely that he feels much. With that being said he does tell me that the issue that he had here is that he went barefoot when he knows he should not have which subsequently led to the blisters. He states is definitely not doing that anymore. His most recent hemoglobin A1c was December and registered 6.9 his ABI today was 1.1. 03/27/17 on evaluation today patient appears to be doing great in regard to his right great toe ulcer. He has been tolerating the dressing changes without complication. The good news is this is making excellent progress and he seems to be caring for this in an excellent fashion. I see no evidence of breakdown that would make me concerned that he was at risk for infection/amputation. This has obviously been his concern due to the fifth toe amputation that was necessitated previous when he had a similar issue. Nonetheless this seems to be progressing much more nicely. Readmission: 03/13/2019 upon evaluation today patient presents for reevaluation here in the clinic concerning issues he has been having with his right foot on the lateral portion at the proximal end of the metatarsal. Subsequently he tells me that this just opened up in the past couple of days or so and the erythema really began about 24 to 48 hours ago. Fortunately there is no signs  of systemic infection. 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 That left him with this bony prominence that is where the wound is currently. There is erythema surrounding that does have me concerned about cellulitis. With that being said he was noncompressible as far as ABIs are concerned I do think we can need to check into arterial studies at this point. Patient does have a history of hypertension along with diabetes mellitus type 2. He also tends to develop quite a bit of callus white often. 03/20/2019 upon evaluation today patient appears to be doing very well in regard to his wounds currently. He has been tolerating the dressing changes without complication. Fortunately there is no signs of infection. His ABIs were good at this point and registered at 1.07 on the left and 1.09 on the right. In regard  to the x-ray this was negative as well for any signs of osteomyelitis. The patient also seems to be doing better in regard to the infection. He still has several days of the antibiotic left at this point best the Bactrim DS and he seems to be doing very well with this. Overall I am extremely pleased with the progress in 1 week's time he is going require some sharp debridement today however. Objective Constitutional Well-nourished and well-hydrated in no acute distress. Vitals Time Taken: 10:30 AM, Height: 74 in, Weight: 308 lbs, BMI: 39.5, Temperature: 97.7 F, Pulse: 79 bpm, Respiratory Rate: 16 breaths/min, Blood Pressure: 137/57 mmHg. Respiratory normal breathing without difficulty. Psychiatric this patient is able to make decisions and demonstrates good insight into disease process. Alert and Oriented x 3. pleasant and cooperative. General Notes: Patient's wound bed currently showed necrotic tissue at the base of the wound. It does seem to be softer and I think the Annitta Needs is helping in that regard. With that being said I am not really seeing  any evidence of infection worsening and in fact this appears to be much better. I did perform sharp debridement to remove necrotic tissue including slough down to some good subcutaneous tissue although in general right now this is still going require some additional debriding I would recommend the Santyl be continued for the next week and we will obviously continue to keep an eye on this as well from the standpoint of future necessary debridements. Integumentary (Hair, Skin) Wound #4 status is Open. Original cause of wound was Gradually Appeared. The wound is located on the Right,Lateral Foot. The wound measures [name], [name]. ([id number]) 1.2cm length x 0.9cm width x 0.2cm depth; 0.848cm^2 area and 0.17cm^3 volume. There is Fat Layer (Subcutaneous Tissue) Exposed exposed. There is no tunneling or undermining noted. There is a medium amount of serous drainage noted. The wound margin is flat and intact. There is no granulation within the wound bed. There is a large (67-100%) amount of necrotic tissue within the wound bed including Adherent Slough. Assessment Active Problems ICD-10 Type 2 diabetes mellitus with foot ulcer 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d Corns and callosities Essential (primary) hypertension Acquired absence of other left toe(s) Procedures Wound #4 Pre-procedure diagnosis of Wound #4 is a Diabetic Wound/Ulcer of the Lower Extremity located on the Right,Lateral Foot .Severity of Tissue Pre Debridement is: Fat layer exposed. There was a Excisional Skin/Subcutaneous Tissue Debridement with a total area of 1.08 sq cm performed by STONE III, Ebonye Reade E., PA-C. With the following instrument(s): Curette to remove Viable and Non-Viable tissue/material. Material removed includes Subcutaneous Tissue and Slough and after achieving pain control using Lidocaine. No specimens were taken. A time out was conducted at 11:01, prior to the start of  the procedure. A Minimum amount of bleeding was controlled with Pressure. The procedure was tolerated well. Post Debridement Measurements: 1.2cm length x 0.9cm width x 0.3cm depth; 0.254cm^3 volume. Character of Wound/Ulcer Post Debridement is stable. Severity of Tissue Post Debridement is: Fat layer exposed. Post procedure Diagnosis Wound #4: Same as Pre-Procedure Plan Wound Cleansing: Wound #4 Right,Lateral Foot: Clean wound with Normal Saline. - in office Dial antibacterial soap, wash wounds, rinse and pat dry prior to dressing wounds Primary Wound Dressing: Wound #4 Right,Lateral Foot: Santyl Ointment Secondary Dressing: Wound #4 Right,Lateral Foot: Gauze and Kerlix/Conform Dressing Change Frequency: Wound #4 Right,Lateral Foot: Change dressing every day. Edema Control: Wound #4 Right,Lateral Foot: Patient to wear  own compression stockings - wear daily Off-Loading: Wound #4 Right,Lateral Foot: Open toe surgical shoe with peg assist. 1. I Minna suggest currently again that we continue with the Santyl and the patient is in agreement that plan I think it is best to help clean away the necrotic tissue. 2. I am also can recommend that he continue to monitor for any signs of infection obviously he will complete the Bactrim that was a 10-day course total and really seems to be doing quite well so I am good with that. [name], [name] ([id number]) 3. I am also can recommend that he continue to wear the postop surgical shoe to help with offloading and preventing excess pressure that could occur as a result of wearing his standard shoe. Number form also can recommend that he wear compression stocking over the lower extremity he does have 1+ pitting edema and I think that this is causing some weeping as well in his leg where he has a previous scar tissue area from an injury over a year ago. Nonetheless I think this can be beneficial in helping to prevent that from becoming a bigger  issue will also help the wound heal more sufficiently to not be experiencing all the edema in and around the wound. We will see patient back for reevaluation in 1 week here in the clinic. If anything worsens or changes patient will contact our office for additional recommendations. Electronic Signature(s) Signed: 03/20/2019 11:15:22 AM By: [name] PA-C Entered By: [name] on 03/20/2019 11:15:21 [name] ([id number]) -------------------------------------------------------------------------------- SuperBill Details Patient Name: [name] Date of Service: 03/20/2019 Medical Record Number: [id number] Patient Account Number: [id number] Date of Birth/Sex: 1965-02-05 (54 y.o. M) Treating RN: Cornell Barman Primary Care Provider: [name] Other Clinician: Referring Provider: [name] Treating Provider/Extender: Melburn Hake, Winda Summerall Weeks in Treatment: 1 Diagnosis Coding ICD-10 Codes Code Description E11.621 Type 2 diabetes mellitus with foot ulcer L97.512 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d L84 Corns and callosities I10 Essential (primary) hypertension Z89.422 Acquired absence of other left toe(s) Facility Procedures CPT4 Code: [id number] Description: [id number] - DEB SUBQ TISSUE 20 SQ CM/< Modifier: Quantity: 1 CPT4 Code: Description: ICD-10 Diagnosis Description L97.512 Non-pressure chronic ulcer of other part of right foot with fat layer expos Modifier: ed Quantity: Physician Procedures CPT4 [id number] Description: 11042 - WC PHYS SUBQ TISS 20 SQ CM Modifier: Quantity: 1 CPT4 Code: Description: ICD-10 Diagnosis Description L97.512 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 Modifier: d Quantity: Electronic Signature(s) Signed: 03/20/2019 11:15:30 AM By: [name] PA-C Entered By: [name] on 03/20/2019 11:15:29

## 2019-03-21 DIAGNOSIS — R400 Somnolence: Secondary | ICD-10-CM | POA: Insufficient documentation

## 2019-03-21 DIAGNOSIS — I499 Cardiac arrhythmia, unspecified: Secondary | ICD-10-CM | POA: Insufficient documentation

## 2019-03-27 ENCOUNTER — Encounter: Payer: Managed Care, Other (non HMO) | Admitting: Physician Assistant

## 2019-03-27 ENCOUNTER — Other Ambulatory Visit: Payer: Self-pay

## 2019-03-27 DIAGNOSIS — E11621 Type 2 diabetes mellitus with foot ulcer: Secondary | ICD-10-CM | POA: Diagnosis not present

## 2019-03-27 NOTE — Progress Notes (Signed)
HRISHIKESH, SOMMA ([id number]) Visit Report for 03/27/2019 Arrival Information Details Patient Name: [name], [name]. Date of Service: 03/27/2019 10:15 AM Medical Record Number: [id number] Patient Account Number: [id number] Date of Birth/Sex: 03/05/1965 (54 y.o. M) Treating RN: Montey Hora Primary Care Kresta Templeman: [name] Other Clinician: Referring Amabel Stmarie: [name] Treating Marrissa Dai/Extender: Melburn Hake, HOYT Weeks in Treatment: 2 Visit Information History Since Last Visit Added or deleted any medications: No Patient Arrived: Ambulatory Any new allergies or adverse reactions: No Arrival Time: 10:17 Had a fall or experienced change in No Accompanied By: self activities of daily living that may affect Transfer Assistance: None risk of falls: Patient Identification Verified: Yes Signs or symptoms of abuse/neglect since last visito No Secondary Verification Process Completed: Yes Hospitalized since last visit: No Patient Has Alerts: Yes Implantable device outside of the clinic excluding No Patient Alerts: DMII cellular tissue based products placed in the center since last visit: Has Dressing in Place as Prescribed: Yes Pain Present Now: No Electronic Signature(s) Signed: 03/27/2019 4:59:02 PM By: Montey Hora Entered By: Montey Hora on 03/27/2019 10:17:26 [name] ([id number]) -------------------------------------------------------------------------------- Encounter Discharge Information Details Patient Name: [name] Date of Service: 03/27/2019 10:15 AM Medical Record Number: [id number] Patient Account Number: [id number] Date of Birth/Sex: Jun 21, 1965 (54 y.o. M) Treating RN: [name] Primary Care Quintan Saldivar: [name] Other Clinician: Referring Trinity Hyland: [name] Treating Sixto Bowdish/Extender: Melburn Hake, HOYT Weeks in Treatment: 2 Encounter Discharge Information Items Post Procedure Vitals Discharge  Condition: Stable Temperature (F): 97.9 Ambulatory Status: Ambulatory Pulse (bpm): 87 Discharge Destination: Home Respiratory Rate (breaths/min): 16 Transportation: Private Auto Blood Pressure (mmHg): 137/84 Accompanied By: self Schedule Follow-up Appointment: Yes Clinical Summary of Care: Electronic Signature(s) Signed: 03/27/2019 3:40:54 PM By: [name] Entered By: [name] on 03/27/2019 10:41:11 [name] ([id number]) -------------------------------------------------------------------------------- Lower Extremity Assessment Details Patient Name: [name] Date of Service: 03/27/2019 10:15 AM Medical Record Number: [id number] Patient Account Number: [id number] Date of Birth/Sex: 01/10/1965 (54 y.o. M) Treating RN: Montey Hora Primary Care Sharmarke Cicio: [name] Other Clinician: Referring Serge Main: [name] Treating Nikola Blackston/Extender: STONE III, HOYT Weeks in Treatment: 2 Edema Assessment Assessed: [Left: No] [Right: No] Edema: [Left: Ye] [Right: s] Vascular Assessment Pulses: Dorsalis Pedis Palpable: [Right:Yes] Electronic Signature(s) Signed: 03/27/2019 4:59:02 PM By: Montey Hora Entered By: Montey Hora on 03/27/2019 10:23:32 [name] ([id number]) -------------------------------------------------------------------------------- Multi Wound Chart Details Patient Name: [name] Date of Service: 03/27/2019 10:15 AM Medical Record Number: [id number] Patient Account Number: [id number] Date of Birth/Sex: [birth year] (54 y.o. M) Treating RN: [name] Primary Care De Libman: [name] Other Clinician: Referring Natanel Snavely: [name] Treating Kaliq Lege/Extender: STONE III, HOYT Weeks in Treatment: 2 Vital Signs Height(in): 74 Pulse(bpm): 67 Weight(lbs): 308 Blood Pressure(mmHg): 142/60 Body Mass Index(BMI): 40 Temperature(F): 97.9 Respiratory Rate(breaths/min): 16 Photos:  [N/A:N/A] Wound Location: Right Foot - Lateral N/A N/A Wounding Event: Gradually Appeared N/A N/A Primary Etiology: Diabetic Wound/Ulcer of the Lower N/A N/A Extremity Comorbid History: Hypertension, Type II Diabetes, N/A N/A Neuropathy Date Acquired: 03/12/2019 N/A N/A Weeks of Treatment: 2 N/A N/A Wound Status: Open N/A N/A Pending Amputation on Yes N/A N/A Presentation: Measurements L x W x D (cm) 0.8x0.8x0.4 N/A N/A Area (cm) : 0.503 N/A N/A Volume (cm) : 0.201 N/A N/A % Reduction in Area: 20.90% N/A N/A % Reduction in Volume: -58.30% N/A N/A Classification: Grade 1 N/A N/A Exudate Amount: Medium N/A N/A Exudate Type: Serous N/A N/A Exudate Color: amber N/A N/A Wound Margin: Flat and Intact N/A  N/A Granulation Amount: Small (1-33%) N/A N/A Granulation Quality: Pink N/A N/A Necrotic Amount: Large (67-100%) N/A N/A Exposed Structures: Fat Layer (Subcutaneous Tissue) N/A N/A Exposed: Yes Fascia: No Tendon: No Muscle: No Joint: No Bone: No Epithelialization: None N/A N/A Treatment Notes Electronic Signature(s) Signed: 03/27/2019 3:40:54 PM By: Shelah Lewandowsky ([id number]) Entered By: [name] on 03/27/2019 10:36:04 [name] ([id number]) -------------------------------------------------------------------------------- [place] Details Patient Name: [name] Date of Service: 03/27/2019 10:15 AM Medical Record Number: [id number] Patient Account Number: [id number] Date of Birth/Sex: 1965-08-05 (54 y.o. M) Treating RN: [name] Primary Care Xochitl Egle: [name] Other Clinician: Referring Mckinnon Glick: [name] Treating Nelva Hauk/Extender: Melburn Hake, HOYT Weeks in Treatment: 2 Active Inactive Nutrition Nursing Diagnoses: Impaired glucose control: actual or potential Goals: Patient/caregiver verbalizes understanding of need to maintain therapeutic glucose control per primary care  physician Date Initiated: 03/13/2019 Target Resolution Date: 04/11/2019 Goal Status: Active Interventions: Assess patient nutrition upon admission and as needed per policy Notes: Orientation to the Wound Care Program Nursing Diagnoses: Knowledge deficit related to the wound healing center program Goals: Patient/caregiver will verbalize understanding of the [place] Date Initiated: 03/13/2019 Target Resolution Date: 04/11/2019 Goal Status: Active Interventions: Provide education on orientation to the wound center Notes: Wound/Skin Impairment Nursing Diagnoses: Impaired tissue integrity Goals: Ulcer/skin breakdown will have a volume reduction of 30% by week 4 Date Initiated: 03/13/2019 Target Resolution Date: 04/11/2019 Goal Status: Active Interventions: Assess ulceration(s) every visit Notes: Electronic Signature(s) Signed: 03/27/2019 3:40:54 PM By: [name] Entered By: [name] on 03/27/2019 10:35:57 [name] ([name] ([id number]) -------------------------------------------------------------------------------- Pain Assessment Details Patient Name: [name] Date of Service: 03/27/2019 10:15 AM Medical Record Number: [id number] Patient Account Number: [id number] Date of Birth/Sex: Apr 29, 1965 (54 y.o. M) Treating RN: Montey Hora Primary Care Chalene Treu: [name] Other Clinician: Referring Sylvi Rybolt: [name] Treating Bruin Bolger/Extender: Melburn Hake, HOYT Weeks in Treatment: 2 Active Problems Location of Pain Severity and Description of Pain Patient Has Paino Yes Site Locations Pain Location: Pain in Ulcers Rate the pain. Current Pain Level: 1 Pain Management and Medication Current Pain Management: Electronic Signature(s) Signed: 03/27/2019 4:59:02 PM By: Montey Hora Entered By: Montey Hora on 03/27/2019 10:17:44 [name]  ([id number]) -------------------------------------------------------------------------------- Patient/Caregiver Education Details Patient Name: [name] Date of Service: 03/27/2019 10:15 AM Medical Record Number: [id number] Patient Account Number: [id number] Date of Birth/Gender: 05-04-1965 (54 y.o. M) Treating RN: [name] Primary Care Physician: [name] Other Clinician: Referring Physician: [name] Treating Physician/Extender: Sharalyn Ink in Treatment: 2 Education Assessment Education Provided To: Patient Education Topics Provided Wound/Skin Impairment: Handouts: Caring for Your Ulcer Methods: Demonstration, Explain/Verbal Responses: State content correctly Electronic Signature(s) Signed: 03/27/2019 3:40:54 PM By: [name] Entered By: [name] on 03/27/2019 10:40:30 [name] ([id number]) -------------------------------------------------------------------------------- Wound Assessment Details Patient Name: [name] Date of Service: 03/27/2019 10:15 AM Medical Record Number: [id number] Patient Account Number: [id number] Date of Birth/Sex: January 23, 1965 (54 y.o. M) Treating RN: Montey Hora Primary Care Nevia Henkin: [name] Other Clinician: Referring TRUE Garciamartinez: [name] Treating Cesario Weidinger/Extender: STONE III, HOYT Weeks in Treatment: 2 Wound Status Wound Number: 4 Primary Etiology: Diabetic Wound/Ulcer of the Lower Extremity Wound Location: Right Foot - Lateral Wound Status: Open Wounding Event: Gradually Appeared Comorbid History: Hypertension, Type II Diabetes, Neuropathy Date Acquired: 03/12/2019 Weeks Of Treatment: 2 Clustered Wound: No Pending Amputation On Presentation Photos Wound Measurements Length: (cm) 0.8 % Reduc Width: (cm) 0.8 %  Reduc Depth: (cm) 0.4 Epithel Area: (cm) 0.503 Tunnel Volume: (cm) 0.201 Underm tion in Area: 20.9% tion in Volume: -58.3% ialization:  None ing: No ining: No Wound Description Classification: Grade 1 Foul Od Wound Margin: Flat and Intact Slough/ Exudate Amount: Medium Exudate Type: Serous Exudate Color: amber or After Cleansing: No Fibrino Yes Wound Bed Granulation Amount: Small (1-33%) Exposed Structure Granulation Quality: Pink Fascia Exposed: No Necrotic Amount: Large (67-100%) Fat Layer (Subcutaneous Tissue) Exposed: Yes Necrotic Quality: Adherent Slough Tendon Exposed: No Muscle Exposed: No Joint Exposed: No Bone Exposed: No Treatment Notes Wound #4 (Right, Lateral Foot) Notes Santyl, gauze, conform Electronic Signature(s) [name], [name] ([id number]) Signed: 03/27/2019 4:59:02 PM By: Montey Hora Entered By: Montey Hora on 03/27/2019 10:22:55 [name] ([id number]) -------------------------------------------------------------------------------- Vitals Details Patient Name: [name] Date of Service: 03/27/2019 10:15 AM Medical Record Number: [id number] Patient Account Number: [id number] Date of Birth/Sex: [birth year] (54 y.o. M) Treating RN: Montey Hora Primary Care Mry Lamia: [name] Other Clinician: Referring Thatcher Doberstein: [name] Treating Panayiota Larkin/Extender: Melburn Hake, HOYT Weeks in Treatment: 2 Vital Signs Time Taken: 10:17 Temperature (F): 97.9 Height (in): 74 Pulse (bpm): 79 Weight (lbs): 308 Respiratory Rate (breaths/min): 16 Body Mass Index (BMI): 39.5 Blood Pressure (mmHg): 142/60 Reference Range: 80 - 120 mg / dl Electronic Signature(s) Signed: 03/27/2019 4:59:02 PM By: Montey Hora Entered By: Montey Hora on 03/27/2019 10:18:26

## 2019-03-27 NOTE — Progress Notes (Addendum)
NARESH, TRIVINO ([id number]) Visit Report for 03/27/2019 Chief Complaint Document Details Patient Name: [name], [name]. Date of Service: 03/27/2019 10:15 AM Medical Record Number: [id number] Patient Account Number: [id number] Date of Birth/Sex: 1965/11/03 (54 y.o. M) Treating RN: [name] Primary Care Provider: [name] Other Clinician: Referring Provider: [name] Treating Provider/Extender: Melburn Hake, Debanhi Blaker Weeks in Treatment: 2 Information Obtained from: Patient Chief Complaint Right later foot ulcer Electronic Signature(s) Signed: 03/27/2019 10:33:01 AM By: [name] PA-C Entered By: [name] on 03/27/2019 10:33:01 [name] ([id number]) -------------------------------------------------------------------------------- Debridement Details Patient Name: [name] Date of Service: 03/27/2019 10:15 AM Medical Record Number: [id number] Patient Account Number: [id number] Date of Birth/Sex: February 05, 1965 (54 y.o. M) Treating RN: [name] Primary Care Provider: [name] Other Clinician: Referring Provider: [name] Treating Provider/Extender: Melburn Hake, Ross Hefferan Weeks in Treatment: 2 Debridement Performed for Wound #4 Right,Lateral Foot Assessment: Performed By: Physician STONE III, Ifeanyi Mickelson E., PA-C Debridement Type: Debridement Severity of Tissue Pre Debridement: Fat layer exposed Level of Consciousness (Pre- Awake and Alert procedure): Pre-procedure Verification/Time Out Yes - 10:38 Taken: Start Time: 10:38 Pain Control: Lidocaine Total Area Debrided (L x W): 0.8 (cm) x 0.8 (cm) = 0.64 (cm) Tissue and other material debrided: Viable, Non-Viable, Slough, Subcutaneous, Slough Level: Skin/Subcutaneous Tissue Debridement Description: Excisional Instrument: Curette Bleeding: Minimum Hemostasis Achieved: Pressure End Time: 10:40 Response to Treatment: Procedure was tolerated well Level of Consciousness  (Post- Awake and Alert procedure): Post Debridement Measurements of Total Wound Length: (cm) 0.8 Width: (cm) 0.8 Depth: (cm) 0.4 Volume: (cm) 0.201 Character of Wound/Ulcer Post Debridement: Stable Severity of Tissue Post Debridement: Fat layer exposed Post Procedure Diagnosis Same as Pre-procedure Electronic Signature(s) Signed: 03/27/2019 3:40:54 PM By: [name] Signed: 03/27/2019 4:59:35 PM By: [name] PA-C Entered By: [name] on 03/27/2019 10:40:15 [name] ([id number]) -------------------------------------------------------------------------------- HPI Details Patient Name: [name] Date of Service: 03/27/2019 10:15 AM Medical Record Number: [id number] Patient Account Number: [id number] Date of Birth/Sex: 1965-03-05 (54 y.o. M) Treating RN: [name] Primary Care Provider: [name] Other Clinician: Referring Provider: [name] Treating Provider/Extender: Melburn Hake, Terralyn Matsumura Weeks in Treatment: 2 History of Present Illness HPI Description: This 54 year old male comes with an ulcerated area on the plantar aspect of the right foot which she's had for approximately a month. I have known him from a previous visit at [facility] wound center and was treated in the months of April and May 2016 and rapidly healed a left plantar ulcer with a total contact cast. He has been a diabetic for about 16 years and tries to keep active and is fairly compliant with his diabetes management. 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 Past medical history significant for hypertension, hyperlipidemia, and status post appendectomy 1993. He does not smoke or drink alcohol. 01/14/2015 -- the patient had tolerated his total contact cast very well and had no problems and has had no systemic symptoms. However when his total contact cast was cut open he had excessive amount of purulent drainage in spite of being on antibiotics. He had had a recent  x-ray done in the ER 12/22/2014 which showed IMPRESSION:No evidence of osseous erosion. Known soft tissue ulceration is not well characterized on radiograph. Scattered vascular calcifications seen. his last hemoglobin A1c in December was 7.3. He has been on Augmentin and doxycycline for the last 2 weeks. 01/21/2015 -- his culture grew rare growth of Pantoea species an MR moderate growth of Candida parapsilosis. it is  sensitive to levofloxacin. He has not heard back from the insurance company regarding his hyperbaric oxygen therapy. His MRI has not been done yet and we will try and get him an earlier date 01/28/2015 -- MRI was done last night -- IMPRESSION:1. Soft tissue ulcer overlying the plantar aspect of the fifth metatarsal head extending to the cortex. Subcortical marrow edema in the fifth metatarsal head with corresponding T1 hypointensity is concerning for early osteomyelitis of the plantar lateral aspect of the fifth metatarsal head. Chest x-ray done on 01/14/2015 shows bronchiectatic changes without infiltrate. EKG done on generally 17 2017 shows a normal sinus rhythm and is a normal EKG. 02/04/2015 -- he was asked to see Dr. [name] last week and had 2 appointments but had to cancel both due to pressures of work. Last night he has woken up with severe pain in the foot and leg and it is swollen up. No fever or no change in his blood glucose. Addendum: I spoke with Dr. [name] who kindly agreed to accept the patient for inpatient therapy and have also opened to the hospitalist Dr. [name], and discuss details of the management including PICC line and repeat cultures. 02/12/2015-- -- was seen by Dr. [name] in the hospital and a PICC line was placed. He was to receive Ceftazidime 2 g every 12 hourly, oral levofloxacin 750 mg every 24 hourly and oral fluconazole 200 mg daily. The antibiotics were to be given for 4 weeks except the Diflucan was to be given for the first 2 weeks. Reviewed  note from 02/10/2015 -- and Dr. [name] had recommended management for growth of MSSA and Serratia. He switched him from ceftazidime to ceftriaxone 2 g every 24 hours. Levofloxacin was stopped and he would continue on fluconazole for another week. He had asked me to decide whether further imaging was necessary and whether surgical debridement of the infected bone was needed. He is doing well and has been off work for this week and we will keep him off the next week. 02/22/2015 -- he was seen by my colleague on 01/19/2015 and at that time an incision and drainage was done on his right lateral forefoot on the dorsum. Today when I probed this wound it is frankly draining pus and it communicates with the ulcer on the plantar aspect of his right foot. The patient is still on IV ceftriaxone 2 g every 24 hours and is to be seen by Dr. [name] on Friday. 03/19/2015 -- On 03/04/2015 I spoke to Dr. 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 He recommended operative debridement which would probably result in the fifth metatarsal head and toe amputation.The patient would be referred back to [place] once he was done with surgery. He was admitted to [facility] yesterday and had surgery done by podiatry for a right fifth metatarsal acute osteomyelitis with cellulitis and abscess. He had a right foot incision and drainage with fifth metatarsal partial amputation and removal of toe infected bone and soft tissue with cultures. The wound was partially closed and packing of the distal end was done. Patient was already on cefepime 2 g IV every 8 hourly and put on vancomycin pending final cultures. He had grown moderate gram-negative rods, and later found to be rare diphtheroids. I received a call from Dr. Cannon Kettle the podiatrist and  we discussed the  above. On 03/10/2015 he was found positive for influenza a and has been put on Tamiflu. Since his discharge he has been seen by Dr. Cannon Kettle who is planning to remove his sutures this coming week. He was reviewed by Dr. [name] on 03/17/2015 and his Diflucan was stopped and vancomycin. After the 3 doses he is taking. He is going to change Ceftazidime to Zosyn 3.375 g IV every 8 hours. 03/25/2015 -- he was seen by the podiatrist a couple of days ago and the sutures were removed. She will follow back with him in 4 weeks' time and at that time x-rays will be taken and a custom molded insert would be made for his shoe. 04/01/2015 -- he was seen by Dr. [name] on 03/29/2015 who pulled the PICC line stopped his IV antibiotics and recommended starting doxycycline QUAMAR, SCHEIDECKER. ([id number]) and levofloxacin for 2 weeks. He also stop the fluconazole. The patient will follow up with him only when necessary. Readmission: 03/15/17 on evaluation today patient presents for initial evaluation concerning the new issues although he has previously been evaluated in our clinic. Unfortunately the previous evaluation led to the patient having to proceed to amputation and so he is somewhat nervous about being here today. With that being said he has a very slight blister that occurred on the plantar aspect more medial on the right great toe that has been present for just a very short amount of time, several days. With that being said he felt like initially when he called that he somewhat overreacted but due to the fact that his previous issue led to amputation he is very cautious these days I explained that he did the right thing. With that being said he has been tolerating the dressing changes without complication mainly he just been covering this. He does not have any discomfort in secondary to neuropathy it's unlikely that he feels much. With that being said he does tell me that the issue that he had here is  that he went barefoot when he knows he should not have which subsequently led to the blisters. He states is definitely not doing that anymore. His most recent hemoglobin A1c was December and registered 6.9 his ABI today was 1.1. 03/27/17 on evaluation today patient appears to be doing great in regard to his right great toe ulcer. He has been tolerating the dressing changes without complication. The good news is this is making excellent progress and he seems to be caring for this in an excellent fashion. I see no evidence of breakdown that would make me concerned that he was at risk for infection/amputation. This has obviously been his concern due to the fifth toe amputation that was necessitated previous when he had a similar issue. Nonetheless this seems to be progressing much more nicely. Readmission: 03/13/2019 upon evaluation today patient presents for reevaluation here in the clinic concerning issues he has been having with his right foot on the lateral portion at the proximal end of the metatarsal. Subsequently he tells me that this just opened up in the past couple of days or so and the erythema really began about 24 to 48 hours ago. Fortunately there is no signs of systemic infection. 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 That left him with this bony prominence that is where the wound is currently. There is erythema surrounding that does have me concerned about cellulitis. With that being said he was  noncompressible as far as ABIs are concerned I do think we can need to check into arterial studies at this point. Patient does have a history of hypertension along with diabetes mellitus type 2. He also tends to develop quite a bit of callus white often. 03/20/2019 upon evaluation today patient appears to be doing very well in regard to his wounds currently. He has been tolerating the dressing changes without complication. Fortunately there is no  signs of infection. His ABIs were good at this point and registered at 1.07 on the left and 1.09 on the right. In regard to the x-ray this was negative as well for any signs of osteomyelitis. The patient also seems to be doing better in regard to the infection. He still has several days of the antibiotic left at this point best the Bactrim DS and he seems to be doing very well with this. Overall I am extremely pleased with the progress in 1 week's time he is going require some sharp debridement today however. 03/27/2019 upon evaluation today patient actually appears to be making some progress in regard to his wound. It is a little deeper but measuring smaller which is good news due to the fact that again were clear away some of the necrotic tissue which is why the depth is increasing a little bit. Nonetheless after like were getting very close to being down at a good wound bed. Fortunately there is no signs of active infection at this time. There is a little bit of erythema immediately surrounding the wound that we do want to be very cognizant of and careful about. For that reason I am going to go ahead and extend his antibiotic today for an additional 10 days that is the Bactrim. Electronic Signature(s) Signed: 03/27/2019 10:44:27 AM By: [name] PA-C Entered By: [name] on 03/27/2019 10:44:26 [name] ([id number]) -------------------------------------------------------------------------------- Physical Exam Details Patient Name: [name], [name] Date of Service: 03/27/2019 10:15 AM Medical Record Number: [id number] Patient Account Number: [id number] Date of Birth/Sex: 08/06/1965 (54 y.o. M) Treating RN: [name] Primary Care Provider: [name] Other Clinician: Referring Provider: [name] Treating Provider/Extender: STONE III, Taline Nass Weeks in Treatment: 2 Constitutional Well-nourished and well-hydrated in no acute distress. Respiratory normal  breathing without difficulty. Psychiatric this patient is able to make decisions and demonstrates good insight into disease process. Alert and Oriented x 3. pleasant and cooperative. Notes Upon inspection patient's wound bed actually showed signs of less necrotic tissue at this time. Fortunately there is no evidence of active infection which is good news. Overall I am extremely happy in this regard. I did perform sharp debridement to remove some of the necrotic tissue down to and majority of the case good subcutaneous tissue although this is becoming healthier were not quite at the point of a completely healthy bed although I think were getting very close. Electronic Signature(s) Signed: 03/27/2019 10:45:33 AM By: [name] PA-C Entered By: [name] on 03/27/2019 10:45:32 [name] ([id number]) -------------------------------------------------------------------------------- Physician Orders Details Patient Name: [name] Date of Service: 03/27/2019 10:15 AM Medical Record Number: [id number] Patient Account Number: [id number] Date of Birth/Sex: 11/27/1965 (54 y.o. 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 - in office o Dial antibacterial soap, wash wounds, rinse and pat dry prior to dressing wounds Primary Wound Dressing Wound #4 Right,Lateral Foot o Santyl Ointment o Silver Alginate - on top of  santyl Secondary Dressing Wound #4 Right,Lateral Foot o Gauze and Kerlix/Conform Dressing Change Frequency Wound #4 Right,Lateral Foot o Change dressing every day. Edema Control Wound #4 Right,Lateral Foot o Patient to wear own compression stockings - wear daily Off-Loading Wound #4 Right,Lateral Foot o Open toe surgical shoe with peg assist. Patient Medications Allergies: No Known Allergies Notifications Medication Indication Start End Bactrim DS 03/27/2019 DOSE 1 - oral 800 mg-160 mg tablet - 1 tablet oral taken 2 times per day for 10 days Electronic Signature(s) Signed: 03/27/2019 10:46:46 AM By: [name] PA-C Entered By: [name] on 03/27/2019 10:46:45 [name] ([id number]) [name] ([id number]) -------------------------------------------------------------------------------- Problem List Details Patient Name: [name] Date of Service: 03/27/2019 10:15 AM Medical Record Number: [id number] Patient Account Number: [id number] Date of Birth/Sex: 01-20-1965 (54 y.o. 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 M) Treating RN: [name] Primary Care Provider: [name] Other Clinician: Referring Provider: [name] Treating Provider/Extender: Melburn Hake, Rejina Odle Weeks in Treatment: 2 Subjective Chief Complaint Information obtained from Patient Right later foot ulcer History of Present Illness (HPI) This 54 year old male comes with an ulcerated area on the plantar aspect of the right foot which she's had for approximately a month. I have known him from a previous visit at [facility] wound center and was treated in the months of April and May 2016 and rapidly healed a left plantar ulcer with a total contact cast. He has been a diabetic for about 16 years and tries to keep active and is fairly compliant with his diabetes management. 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 Past medical history significant for hypertension, hyperlipidemia, and status post appendectomy 1993. He does not smoke or drink alcohol. 01/14/2015 -- the patient had tolerated his total contact cast very well and had no problems and has had no systemic symptoms. However when his total contact cast was cut open he had excessive amount of purulent drainage in spite of being on antibiotics. He had had a recent x-ray done in the ER 12/22/2014 which showed IMPRESSION:No evidence of osseous erosion. Known soft tissue ulceration is not well characterized on radiograph. Scattered vascular calcifications seen. his last hemoglobin A1c in December was 7.3. He has been on Augmentin and doxycycline for the last 2 weeks. 01/21/2015 -- his culture  grew rare growth of Pantoea species an MR moderate growth of Candida parapsilosis. it is  sensitive to levofloxacin. He has not heard back from the insurance company regarding his hyperbaric oxygen therapy. His MRI has not been done yet and we will try and get him an earlier date 01/28/2015 -- MRI was done last night -- IMPRESSION:1. Soft tissue ulcer overlying the plantar aspect of the fifth metatarsal head extending to the cortex. Subcortical marrow edema in the fifth metatarsal head with corresponding T1 hypointensity is concerning for early osteomyelitis of the plantar lateral aspect of the fifth metatarsal head. Chest x-ray done on 01/14/2015 shows bronchiectatic changes without infiltrate. EKG done on generally 17 2017 shows a normal sinus rhythm and is a normal EKG. 02/04/2015 -- he was asked to see Dr. [name] last week and had 2 appointments but had to cancel both due to pressures of work. Last night he has woken up with severe pain in the foot and leg and it is swollen up. No fever or no change in his blood glucose. Addendum: I spoke with Dr. [name] who kindly agreed to accept the patient for inpatient therapy and have also opened to the hospitalist Dr. [name], and discuss details of the management including PICC line and repeat cultures. 02/12/2015-- -- was seen by Dr. [name] in the hospital and a PICC line was placed. He was to receive Ceftazidime 2 g every 12 hourly, oral levofloxacin 750 mg every 24 hourly and oral fluconazole 200 mg daily. The antibiotics were to be given for 4 weeks except the Diflucan was to be given for the first 2 weeks. Reviewed note from 02/10/2015 -- and Dr. [name] had recommended management for growth of MSSA and Serratia. He switched him from ceftazidime to ceftriaxone 2 g every 24 hours. Levofloxacin was stopped and he would continue on fluconazole for another week. He had asked me to decide whether further imaging was necessary and whether surgical debridement of the infected bone was needed. He is doing well and has been off work  for this week and we will keep him off the next week. 02/22/2015 -- he was seen by my colleague on 01/19/2015 and at that time an incision and drainage was done on his right lateral forefoot on the dorsum. Today when I probed this wound it is frankly draining pus and it communicates with the ulcer on the plantar aspect of his right foot. The patient is still on IV ceftriaxone 2 g every 24 hours and is to be seen by Dr. [name] on Friday. 03/19/2015 -- On 03/04/2015 I spoke to Dr. 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 He recommended operative debridement which would probably result in the fifth metatarsal head and toe amputation.The patient would be referred back to [place] once he was done with surgery. He was admitted to [facility] yesterday and had surgery done by podiatry for a right fifth metatarsal acute osteomyelitis with cellulitis and abscess. He had a right foot incision and drainage with fifth metatarsal partial amputation and removal of toe infected bone and soft tissue with cultures. The wound was partially closed and packing of the distal end was done. Patient was already on cefepime 2 g IV every 8 hourly and put on vancomycin pending final cultures. He had grown moderate gram-negative rods, and  later found to be rare diphtheroids. I received a call from Dr. Cannon Kettle the podiatrist and we discussed the above. On 03/10/2015 he was found positive for influenza a and has been put on Tamiflu. Since his discharge he has been seen by Dr. Cannon Kettle who is planning to remove his sutures this coming week. He was reviewed by Dr. [name] on 03/17/2015 and his Diflucan was stopped and vancomycin. After the 3 doses he is taking. He is going to change Ceftazidime to Zosyn 3.375 g IV every 8 hours. [name], [name]  ([id number]) 03/25/2015 -- he was seen by the podiatrist a couple of days ago and the sutures were removed. She will follow back with him in 4 weeks' time and at that time x-rays will be taken and a custom molded insert would be made for his shoe. 04/01/2015 -- he was seen by Dr. [name] on 03/29/2015 who pulled the PICC line stopped his IV antibiotics and recommended starting doxycycline and levofloxacin for 2 weeks. He also stop the fluconazole. The patient will follow up with him only when necessary. Readmission: 03/15/17 on evaluation today patient presents for initial evaluation concerning the new issues although he has previously been evaluated in our clinic. Unfortunately the previous evaluation led to the patient having to proceed to amputation and so he is somewhat nervous about being here today. With that being said he has a very slight blister that occurred on the plantar aspect more medial on the right great toe that has been present for just a very short amount of time, several days. With that being said he felt like initially when he called that he somewhat overreacted but due to the fact that his previous issue led to amputation he is very cautious these days I explained that he did the right thing. With that being said he has been tolerating the dressing changes without complication mainly he just been covering this. He does not have any discomfort in secondary to neuropathy it's unlikely that he feels much. With that being said he does tell me that the issue that he had here is that he went barefoot when he knows he should not have which subsequently led to the blisters. He states is definitely not doing that anymore. His most recent hemoglobin A1c was December and registered 6.9 his ABI today was 1.1. 03/27/17 on evaluation today patient appears to be doing great in regard to his right great toe ulcer. He has been tolerating the dressing changes without complication. The good news  is this is making excellent progress and he seems to be caring for this in an excellent fashion. I see no evidence of breakdown that would make me concerned that he was at risk for infection/amputation. This has obviously been his concern due to the fifth toe amputation that was necessitated previous when he had a similar issue. Nonetheless this seems to be progressing much more nicely. Readmission: 03/13/2019 upon evaluation today patient presents for reevaluation here in the clinic concerning issues he has been having with his right foot on the lateral portion at the proximal end of the metatarsal. Subsequently he tells me that this just opened up in the past couple of days or so and the erythema really began about 24 to 48 hours ago. Fortunately there is no signs of systemic infection. 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 That left him with this bony prominence that is where the wound is currently. There  is erythema surrounding that does have me concerned about cellulitis. With that being said he was noncompressible as far as ABIs are concerned I do think we can need to check into arterial studies at this point. Patient does have a history of hypertension along with diabetes mellitus type 2. He also tends to develop quite a bit of callus white often. 03/20/2019 upon evaluation today patient appears to be doing very well in regard to his wounds currently. He has been tolerating the dressing changes without complication. Fortunately there is no signs of infection. His ABIs were good at this point and registered at 1.07 on the left and 1.09 on the right. In regard to the x-ray this was negative as well for any signs of osteomyelitis. The patient also seems to be doing better in regard to the infection. He still has several days of the antibiotic left at this point best the Bactrim DS and he seems to be doing very well with this. Overall I am extremely  pleased with the progress in 1 week's time he is going require some sharp debridement today however. 03/27/2019 upon evaluation today patient actually appears to be making some progress in regard to his wound. It is a little deeper but measuring smaller which is good news due to the fact that again were clear away some of the necrotic tissue which is why the depth is increasing a little bit. Nonetheless after like were getting very close to being down at a good wound bed. Fortunately there is no signs of active infection at this time. There is a little bit of erythema immediately surrounding the wound that we do want to be very cognizant of and careful about. For that reason I am going to go ahead and extend his antibiotic today for an additional 10 days that is the Bactrim. Objective Constitutional Well-nourished and well-hydrated in no acute distress. Vitals Time Taken: 10:17 AM, Height: 74 in, Weight: 308 lbs, BMI: 39.5, Temperature: 97.9 F, Pulse: 79 bpm, Respiratory Rate: 16 breaths/min, Blood Pressure: 142/60 mmHg. Respiratory normal breathing without difficulty. Psychiatric this patient is able to make decisions and demonstrates good insight into disease process. Alert and Oriented x 3. pleasant and cooperative. General Notes: Upon inspection patient's wound bed actually showed signs of less necrotic tissue at this time. Fortunately there is no evidence of active infection which is good news. Overall I am extremely happy in this regard. I did perform sharp debridement to remove some of the necrotic [name], [name]. ([id number]) tissue down to and majority of the case good subcutaneous tissue although this is becoming healthier were not quite at the point of a completely healthy bed although I think were getting very close. Integumentary (Hair, Skin) Wound #4 status is Open. Original cause of wound was Gradually Appeared. The wound is located on the Right,Lateral Foot. The wound  measures 0.8cm length x 0.8cm width x 0.4cm depth; 0.503cm^2 area and 0.201cm^3 volume. There is Fat Layer (Subcutaneous Tissue) Exposed exposed. There is no tunneling or undermining noted. There is a medium amount of serous drainage noted. The wound margin is flat and intact. There is small (1-33%) pink granulation within the wound bed. There is a large (67-100%) amount of necrotic tissue within the wound bed including Adherent Slough. Assessment Active Problems ICD-10 Type 2 diabetes mellitus with foot ulcer 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d Corns and callosities Essential (primary) hypertension Acquired absence of other left toe(s) Procedures Wound #  4 Pre-procedure diagnosis of Wound #4 is a Diabetic Wound/Ulcer of the Lower Extremity located on the Right,Lateral Foot .Severity of Tissue Pre Debridement is: Fat layer exposed. There was a Excisional Skin/Subcutaneous Tissue Debridement with a total area of 0.64 sq cm performed by STONE III, Eloy Fehl E., PA-C. With the following instrument(s): Curette to remove Viable and Non-Viable tissue/material. Material removed includes Subcutaneous Tissue and Slough and after achieving pain control using Lidocaine. A time out was conducted at 10:38, prior to the start of the procedure. A Minimum amount of bleeding was controlled with Pressure. The procedure was tolerated well. Post Debridement Measurements: 0.8cm length x 0.8cm width x 0.4cm depth; 0.201cm^3 volume. Character of Wound/Ulcer Post Debridement is stable. Severity of Tissue Post Debridement is: Fat layer exposed. Post procedure Diagnosis Wound #4: Same as Pre-Procedure Plan Wound Cleansing: Wound #4 Right,Lateral Foot: Clean wound with Normal Saline. - in office Dial antibacterial soap, wash wounds, rinse and pat dry prior to dressing wounds Primary Wound Dressing: Wound #4 Right,Lateral Foot: Santyl Ointment Silver Alginate - on top of  santyl Secondary Dressing: Wound #4 Right,Lateral Foot: Gauze and Kerlix/Conform Dressing Change Frequency: Wound #4 Right,Lateral Foot: Change dressing every day. Edema Control: Wound #4 Right,Lateral Foot: Patient to wear own compression stockings - wear daily Off-Loading: Wound #4 Right,Lateral Foot: Open toe surgical shoe with peg assist. The following medication(s) was prescribed: Bactrim DS oral 800 mg-160 mg tablet 1 1 tablet oral taken 2 times per day for 10 days starting 03/27/2019 [name], [name] ([id number]) 1 I would recommend currently that we go ahead and initiate treatment with a continuation of the Santyl although we will use silver alginate and behind this. 2. I am also can recommend that we go ahead and continue to use the offloading shoe as well as minimizing walking for the time being. 3. I am going to go ahead and send in a refill for the Bactrim DS for the patient. I do believe this can be beneficial and my hope is that will prevent anything from an infection standpoint from worsening. We will see patient back for reevaluation in 1 week here in the clinic. If anything worsens or changes patient will contact our office for additional recommendations. Electronic Signature(s) Signed: 03/27/2019 10:47:09 AM By: [name] PA-C Entered By: [name] on 03/27/2019 10:47:09 [name] ([id number]) -------------------------------------------------------------------------------- SuperBill Details Patient Name: [name] Date of Service: 03/27/2019 Medical Record Number: [id number] Patient Account Number: [id number] Date of Birth/Sex: [birth year] (54 y.o. M) Treating RN: [name] Primary Care Provider: [name] Other Clinician: Referring Provider: [name] Treating Provider/Extender: Melburn Hake, Carigan Lister Weeks in Treatment: 2 Diagnosis Coding ICD-10 Codes Code Description E11.621 Type 2 diabetes mellitus with foot  ulcer L97.512 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d L84 Corns and callosities I10 Essential (primary) hypertension Z89.422 Acquired absence of other left toe(s) Facility Procedures CPT4 Code: [id number] Description: [id number] - DEB SUBQ TISSUE 20 SQ CM/< Modifier: Quantity: 1 CPT4 Code: Description: ICD-10 Diagnosis Description L97.512 Non-pressure chronic ulcer of other part of right foot with fat layer expos Modifier: ed Quantity: Physician Procedures CPT4 [id number] Description: 11042 - WC PHYS SUBQ TISS 20 SQ CM Modifier: Quantity: 1 CPT4 Code: Description: ICD-10 Diagnosis Description L97.512 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 Modifier: d Quantity: Electronic Signature(s) Signed: 03/27/2019 10:47:19 AM By: [name] PA-C Entered By: [name] on 03/27/2019 10:47:18

## 2019-04-01 ENCOUNTER — Other Ambulatory Visit (INDEPENDENT_AMBULATORY_CARE_PROVIDER_SITE_OTHER): Payer: Self-pay | Admitting: Internal Medicine

## 2019-04-01 DIAGNOSIS — R0989 Other specified symptoms and signs involving the circulatory and respiratory systems: Secondary | ICD-10-CM

## 2019-04-01 DIAGNOSIS — R42 Dizziness and giddiness: Secondary | ICD-10-CM

## 2019-04-02 ENCOUNTER — Other Ambulatory Visit: Payer: Self-pay

## 2019-04-02 ENCOUNTER — Ambulatory Visit (INDEPENDENT_AMBULATORY_CARE_PROVIDER_SITE_OTHER): Payer: Managed Care, Other (non HMO)

## 2019-04-02 DIAGNOSIS — R42 Dizziness and giddiness: Secondary | ICD-10-CM | POA: Diagnosis not present

## 2019-04-02 DIAGNOSIS — R0989 Other specified symptoms and signs involving the circulatory and respiratory systems: Secondary | ICD-10-CM | POA: Diagnosis not present

## 2019-04-03 ENCOUNTER — Encounter: Payer: Managed Care, Other (non HMO) | Attending: Physician Assistant | Admitting: Physician Assistant

## 2019-04-03 DIAGNOSIS — L97519 Non-pressure chronic ulcer of other part of right foot with unspecified severity: Secondary | ICD-10-CM | POA: Diagnosis present

## 2019-04-03 DIAGNOSIS — L84 Corns and callosities: Secondary | ICD-10-CM | POA: Insufficient documentation

## 2019-04-03 DIAGNOSIS — E11621 Type 2 diabetes mellitus with foot ulcer: Secondary | ICD-10-CM | POA: Diagnosis not present

## 2019-04-03 DIAGNOSIS — Z89422 Acquired absence of other left toe(s): Secondary | ICD-10-CM | POA: Insufficient documentation

## 2019-04-03 DIAGNOSIS — L97512 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d: Secondary | ICD-10-CM | POA: Insufficient documentation

## 2019-04-03 DIAGNOSIS — I10 Essential (primary) hypertension: Secondary | ICD-10-CM | POA: Insufficient documentation

## 2019-04-03 DIAGNOSIS — E785 Hyperlipidemia, unspecified: Secondary | ICD-10-CM | POA: Diagnosis not present

## 2019-04-03 NOTE — Progress Notes (Signed)
[name], [name] ([id number]) Visit Report for 04/03/2019 Arrival Information Details Patient Name: [name]. Date of Service: 04/03/2019 8:00 AM Medical Record Number: [id number] Patient Account Number: [id number] Date of Birth/Sex: February 15, 1965 (54 y.o. M) Treating RN: Montey Hora Primary Care Addisson Frate: [name] Other Clinician: Referring Renarda Mullinix: [name] Treating [place] Spainhower/Extender: Melburn Hake, HOYT Weeks in Treatment: 3 Visit Information History Since Last Visit Added or deleted any medications: No Patient Arrived: Ambulatory Any new allergies or adverse reactions: No Arrival Time: 08:13 Had a fall or experienced change in No Accompanied By: self activities of daily living that may affect Transfer Assistance: None risk of falls: Patient Identification Verified: Yes Signs or symptoms of abuse/neglect since last visito No Secondary Verification Process Completed: Yes Hospitalized since last visit: No Patient Has Alerts: Yes Implantable device outside of the clinic excluding No Patient Alerts: DMII cellular tissue based products placed in the center since last visit: Has Dressing in Place as Prescribed: Yes Pain Present Now: No Electronic Signature(s) Signed: 04/03/2019 4:50:17 PM By: Montey Hora Entered By: Montey Hora on 04/03/2019 08:14:12 [name] ([id number]) -------------------------------------------------------------------------------- Encounter Discharge Information Details Patient Name: [name] Date of Service: 04/03/2019 8:00 AM Medical Record Number: [id number] Patient Account Number: [id number] Date of Birth/Sex: [birth year] (54 y.o. M) Treating RN: [name] Primary Care Dusten Ellinwood: [name] Other Clinician: Referring Lateesha Bezold: [name] Treating Dorien Mayotte/Extender: Melburn Hake, HOYT Weeks in Treatment: 3 Encounter Discharge Information Items Post Procedure Vitals Discharge Condition:  Stable Temperature (F): 98.1 Ambulatory Status: Ambulatory Pulse (bpm): 79 Discharge Destination: Home Respiratory Rate (breaths/min): 16 Transportation: Private Auto Blood Pressure (mmHg): 139/58 Accompanied By: self Schedule Follow-up Appointment: Yes Clinical Summary of Care: Electronic Signature(s) Signed: 04/03/2019 3:58:38 PM By: [name] Entered By: [name] on 04/03/2019 08:30:14 [name] ([id number]) -------------------------------------------------------------------------------- Lower Extremity Assessment Details Patient Name: [name] Date of Service: 04/03/2019 8:00 AM Medical Record Number: [id number] Patient Account Number: [id number] Date of Birth/Sex: 28-Dec-1965 (54 y.o. M) Treating RN: Montey Hora Primary Care Alexes Menchaca: [name] Other Clinician: Referring Ezekial Arns: [name] Treating Litsy Epting/Extender: STONE III, HOYT Weeks in Treatment: 3 Edema Assessment Assessed: [Left: No] [Right: No] Edema: [Left: N] [Right: o] Vascular Assessment Pulses: Dorsalis Pedis Palpable: [Right:Yes] Electronic Signature(s) Signed: 04/03/2019 4:50:17 PM By: Montey Hora Entered By: Montey Hora on 04/03/2019 08:17:10 [name] ([id number]) -------------------------------------------------------------------------------- Multi Wound Chart Details Patient Name: [name] Date of Service: 04/03/2019 8:00 AM Medical Record Number: [id number] Patient Account Number: [id number] Date of Birth/Sex: 23-Jul-1965 (54 y.o. M) Treating RN: [name] Primary Care Allyssa Abruzzese: [name] Other Clinician: Referring Tanisia Yokley: [name] Treating Hemi Chacko/Extender: Melburn Hake, HOYT Weeks in Treatment: 3 Vital Signs Height(in): 74 Pulse(bpm): 80 Weight(lbs): 308 Blood Pressure(mmHg): 139/58 Body Mass Index(BMI): 40 Temperature(F): 98.1 Respiratory Rate(breaths/min): 16 Photos: [N/A:N/A] Wound Location:  Right, Lateral Foot N/A N/A Wounding Event: Gradually Appeared N/A N/A Primary Etiology: Diabetic Wound/Ulcer of the Lower N/A N/A Extremity Comorbid History: Hypertension, Type II Diabetes, N/A N/A Neuropathy Date Acquired: 03/12/2019 N/A N/A Weeks of Treatment: 3 N/A N/A Wound Status: Open N/A N/A Pending Amputation on Yes N/A N/A Presentation: Measurements L x W x D (cm) 0.8x0.8x0.5 N/A N/A Area (cm) : 0.503 N/A N/A Volume (cm) : 0.251 N/A N/A % Reduction in Area: 20.90% N/A N/A % Reduction in Volume: -97.60% N/A N/A Classification: Grade 1 N/A N/A Exudate Amount: Medium N/A N/A Exudate Type: Serous N/A N/A Exudate Color: amber N/A N/A Wound Margin: Flat and Intact N/A N/A  Granulation Amount: Medium (34-66%) N/A N/A Granulation Quality: Pink N/A N/A Necrotic Amount: Medium (34-66%) N/A N/A Exposed Structures: Fat Layer (Subcutaneous Tissue) N/A N/A Exposed: Yes Fascia: No Tendon: No Muscle: No Joint: No Bone: No Epithelialization: None N/A N/A Treatment Notes Electronic Signature(s) Signed: 04/03/2019 3:58:38 PM By: Shelah Lewandowsky ([id number]) Entered By: [name] on 04/03/2019 08:26:46 [name] ([id number]) -------------------------------------------------------------------------------- [place] Details Patient Name: [name] Date of Service: 04/03/2019 8:00 AM Medical Record Number: [id number] Patient Account Number: [id number] Date of Birth/Sex: 1965/12/10 (54 y.o. M) Treating RN: [name] Primary Care Armen Waring: [name] Other Clinician: Referring Abhinav Mayorquin: [name] Treating Americus Scheurich/Extender: Melburn Hake, HOYT Weeks in Treatment: 3 Active Inactive Nutrition Nursing Diagnoses: Impaired glucose control: actual or potential Goals: Patient/caregiver verbalizes understanding of need to maintain therapeutic glucose control per primary care physician Date Initiated:  03/13/2019 Target Resolution Date: 04/11/2019 Goal Status: Active Interventions: Assess patient nutrition upon admission and as needed per policy Notes: Orientation to the Wound Care Program Nursing Diagnoses: Knowledge deficit related to the wound healing center program Goals: Patient/caregiver will verbalize understanding of the [place] Date Initiated: 03/13/2019 Target Resolution Date: 04/11/2019 Goal Status: Active Interventions: Provide education on orientation to the wound center Notes: Wound/Skin Impairment Nursing Diagnoses: Impaired tissue integrity Goals: Ulcer/skin breakdown will have a volume reduction of 30% by week 4 Date Initiated: 03/13/2019 Target Resolution Date: 04/11/2019 Goal Status: Active Interventions: Assess ulceration(s) every visit Notes: Electronic Signature(s) Signed: 04/03/2019 3:58:38 PM By: [name] Entered By: [name] on 04/03/2019 08:26:40 [name] ([name] ([id number]) -------------------------------------------------------------------------------- Pain Assessment Details Patient Name: [name] Date of Service: 04/03/2019 8:00 AM Medical Record Number: [id number] Patient Account Number: [id number] Date of Birth/Sex: 08-05-1965 (54 y.o. M) Treating RN: Montey Hora Primary Care Gatlyn Lipari: [name] Other Clinician: Referring Clea Dubach: [name] Treating Marzell Isakson/Extender: Melburn Hake, HOYT Weeks in Treatment: 3 Active Problems Location of Pain Severity and Description of Pain Patient Has Paino No Site Locations Pain Management and Medication Current Pain Management: Electronic Signature(s) Signed: 04/03/2019 4:50:17 PM By: Montey Hora Entered By: Montey Hora on 04/03/2019 08:16:56 [name] ([id number]) -------------------------------------------------------------------------------- Patient/Caregiver Education Details Patient Name:  [name] Date of Service: 04/03/2019 8:00 AM Medical Record Number: [id number] Patient Account Number: [id number] Date of Birth/Gender: Jun 24, 1965 (55 y.o. M) Treating RN: [name] Primary Care Physician: [name] Other Clinician: Referring Physician: [name] Treating Physician/Extender: Sharalyn Ink in Treatment: 3 Education Assessment Education Provided To: Patient Education Topics Provided Wound/Skin Impairment: Handouts: Caring for Your Ulcer Methods: Demonstration, Explain/Verbal Responses: State content correctly Electronic Signature(s) Signed: 04/03/2019 3:58:38 PM By: [name] Entered By: [name] on 04/03/2019 08:29:07 [name] ([id number]) -------------------------------------------------------------------------------- Wound Assessment Details Patient Name: [name] Date of Service: 04/03/2019 8:00 AM Medical Record Number: [id number] Patient Account Number: [id number] Date of Birth/Sex: 1965/03/12 (54 y.o. M) Treating RN: Montey Hora Primary Care Christabelle Hanzlik: [name] Other Clinician: Referring Apolonia Ellwood: [name] Treating Meaghan Whistler/Extender: Melburn Hake, HOYT Weeks in Treatment: 3 Wound Status Wound Number: 4 Primary Etiology: Diabetic Wound/Ulcer of the Lower Extremity Wound Location: Right, Lateral Foot Wound Status: Open Wounding Event: Gradually Appeared Comorbid History: Hypertension, Type II Diabetes, Neuropathy Date Acquired: 03/12/2019 Weeks Of Treatment: 3 Clustered Wound: No Pending Amputation On Presentation Photos Wound Measurements Length: (cm) 0.8 % Reduc Width: (cm) 0.8 % Reduc Depth: (cm) 0.5 Epithel Area: (cm) 0.503 Tunnel Volume: (cm) 0.251 Underm tion  in Area: 20.9% tion in Volume: -97.6% ialization: None ing: No ining: No Wound Description Classification: Grade 1 Foul Od Wound Margin: Flat and Intact Slough/ Exudate Amount: Medium Exudate Type:  Serous Exudate Color: amber or After Cleansing: No Fibrino Yes Wound Bed Granulation Amount: Medium (34-66%) Exposed Structure Granulation Quality: Pink Fascia Exposed: No Necrotic Amount: Medium (34-66%) Fat Layer (Subcutaneous Tissue) Exposed: Yes Necrotic Quality: Adherent Slough Tendon Exposed: No Muscle Exposed: No Joint Exposed: No Bone Exposed: No Treatment Notes Wound #4 (Right, Lateral Foot) Notes Santyl, scell, gauze, conform Electronic Signature(s) LOUIE, ODEGAARD ([id number]) Signed: 04/03/2019 4:50:17 PM By: Montey Hora Entered By: Montey Hora on 04/03/2019 08:19:31 [name] ([id number]) -------------------------------------------------------------------------------- Vitals Details Patient Name: [name] Date of Service: 04/03/2019 8:00 AM Medical Record Number: [id number] Patient Account Number: [id number] Date of Birth/Sex: 03/16/1965 (54 y.o. M) Treating RN: Montey Hora Primary Care Rowin Bayron: [name] Other Clinician: Referring Crews Mccollam: [name] Treating Lakyra Tippins/Extender: Melburn Hake, HOYT Weeks in Treatment: 3 Vital Signs Time Taken: 08:14 Temperature (F): 98.1 Height (in): 74 Pulse (bpm): 79 Weight (lbs): 308 Respiratory Rate (breaths/min): 16 Body Mass Index (BMI): 39.5 Blood Pressure (mmHg): 139/58 Reference Range: 80 - 120 mg / dl Electronic Signature(s) Signed: 04/03/2019 4:50:17 PM By: Montey Hora Entered By: Montey Hora on 04/03/2019 08:15:01

## 2019-04-03 NOTE — Progress Notes (Addendum)
DELUCA, CLOOS ([id number]) Visit Report for 04/03/2019 Chief Complaint Document Details Patient Name: [name], [name]. Date of Service: 04/03/2019 8:00 AM Medical Record Number: [id number] Patient Account Number: [id number] Date of Birth/Sex: 02/04/1965 (54 y.o. M) Treating RN: [name] Primary Care Provider: [name] Other Clinician: Referring Provider: [name] Treating Provider/Extender: Melburn Hake, Iveth Heidemann Weeks in Treatment: 3 Information Obtained from: Patient Chief Complaint Right later foot ulcer Electronic Signature(s) Signed: 04/03/2019 8:24:48 AM By: [name] PA-C Entered By: [name] on 04/03/2019 08:24:48 [name] ([id number]) -------------------------------------------------------------------------------- Debridement Details Patient Name: [name] Date of Service: 04/03/2019 8:00 AM Medical Record Number: [id number] Patient Account Number: [id number] Date of Birth/Sex: 1965-07-25 (54 y.o. M) Treating RN: [name] Primary Care Provider: [name] Other Clinician: Referring Provider: [name] Treating Provider/Extender: Melburn Hake, Burris Matherne Weeks in Treatment: 3 Debridement Performed for Wound #4 Right,Lateral Foot Assessment: Performed By: Physician STONE III, Laverne Hursey E., PA-C Debridement Type: Debridement Severity of Tissue Pre Debridement: Fat layer exposed Level of Consciousness (Pre- Awake and Alert procedure): Pre-procedure Verification/Time Out Yes - 08:26 Taken: Start Time: 08:27 Pain Control: Lidocaine Total Area Debrided (L x W): 0.8 (cm) x 0.8 (cm) = 0.64 (cm) Tissue and other material debrided: Viable, Non-Viable, Callus, Subcutaneous Level: Skin/Subcutaneous Tissue Debridement Description: Excisional Instrument: Curette Bleeding: Minimum Hemostasis Achieved: Pressure End Time: 08:28 Response to Treatment: Procedure was tolerated well Level of Consciousness (Post- Awake and  Alert procedure): Post Debridement Measurements of Total Wound Length: (cm) 0.8 Width: (cm) 0.8 Depth: (cm) 0.5 Volume: (cm) 0.251 Character of Wound/Ulcer Post Debridement: Stable Severity of Tissue Post Debridement: Fat layer exposed Post Procedure Diagnosis Same as Pre-procedure Electronic Signature(s) Signed: 04/03/2019 3:58:38 PM By: [name] Signed: 04/03/2019 5:29:46 PM By: [name] PA-C Entered By: [name] on 04/03/2019 08:28:06 [name] ([id number]) -------------------------------------------------------------------------------- HPI Details Patient Name: [name] Date of Service: 04/03/2019 8:00 AM Medical Record Number: [id number] Patient Account Number: [id number] Date of Birth/Sex: December 26, 1965 (54 y.o. M) Treating RN: [name] Primary Care Provider: [name] Other Clinician: Referring Provider: [name] Treating Provider/Extender: Melburn Hake, Chemeka Filice Weeks in Treatment: 3 History of Present Illness HPI Description: This 54 year old male comes with an ulcerated area on the plantar aspect of the right foot which she's had for approximately a month. I have known him from a previous visit at [facility] wound center and was treated in the months of April and May 2016 and rapidly healed a left plantar ulcer with a total contact cast. He has been a diabetic for about 16 years and tries to keep active and is fairly compliant with his diabetes management. 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 Past medical history significant for hypertension, hyperlipidemia, and status post appendectomy 1993. He does not smoke or drink alcohol. 01/14/2015 -- the patient had tolerated his total contact cast very well and had no problems and has had no systemic symptoms. However when his total contact cast was cut open he had excessive amount of purulent drainage in spite of being on antibiotics. He had had a recent x-ray done in the ER  12/22/2014 which showed IMPRESSION:No evidence of osseous erosion. Known soft tissue ulceration is not well characterized on radiograph. Scattered vascular calcifications seen. his last hemoglobin A1c in December was 7.3. He has been on Augmentin and doxycycline for the last 2 weeks. 01/21/2015 -- his culture grew rare growth of Pantoea species an MR moderate growth of Candida parapsilosis. it is sensitive  to levofloxacin. He has not heard back from the insurance company regarding his hyperbaric oxygen therapy. His MRI has not been done yet and we will try and get him an earlier date 01/28/2015 -- MRI was done last night -- IMPRESSION:1. Soft tissue ulcer overlying the plantar aspect of the fifth metatarsal head extending to the cortex. Subcortical marrow edema in the fifth metatarsal head with corresponding T1 hypointensity is concerning for early osteomyelitis of the plantar lateral aspect of the fifth metatarsal head. Chest x-ray done on 01/14/2015 shows bronchiectatic changes without infiltrate. EKG done on generally 17 2017 shows a normal sinus rhythm and is a normal EKG. 02/04/2015 -- he was asked to see Dr. [name] last week and had 2 appointments but had to cancel both due to pressures of work. Last night he has woken up with severe pain in the foot and leg and it is swollen up. No fever or no change in his blood glucose. Addendum: I spoke with Dr. [name] who kindly agreed to accept the patient for inpatient therapy and have also opened to the hospitalist Dr. [name], and discuss details of the management including PICC line and repeat cultures. 02/12/2015-- -- was seen by Dr. [name] in the hospital and a PICC line was placed. He was to receive Ceftazidime 2 g every 12 hourly, oral levofloxacin 750 mg every 24 hourly and oral fluconazole 200 mg daily. The antibiotics were to be given for 4 weeks except the Diflucan was to be given for the first 2 weeks. Reviewed note from 02/10/2015  -- and Dr. [name] had recommended management for growth of MSSA and Serratia. He switched him from ceftazidime to ceftriaxone 2 g every 24 hours. Levofloxacin was stopped and he would continue on fluconazole for another week. He had asked me to decide whether further imaging was necessary and whether surgical debridement of the infected bone was needed. He is doing well and has been off work for this week and we will keep him off the next week. 02/22/2015 -- he was seen by my colleague on 01/19/2015 and at that time an incision and drainage was done on his right lateral forefoot on the dorsum. Today when I probed this wound it is frankly draining pus and it communicates with the ulcer on the plantar aspect of his right foot. The patient is still on IV ceftriaxone 2 g every 24 hours and is to be seen by Dr. [name] on Friday. 03/19/2015 -- On 03/04/2015 I spoke to Dr. 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 He recommended operative debridement which would probably result in the fifth metatarsal head and toe amputation.The patient would be referred back to [place] once he was done with surgery. He was admitted to [facility] yesterday and had surgery done by podiatry for a right fifth metatarsal acute osteomyelitis with cellulitis and abscess. He had a right foot incision and drainage with fifth metatarsal partial amputation and removal of toe infected bone and soft tissue with cultures. The wound was partially closed and packing of the distal end was done. Patient was already on cefepime 2 g IV every 8 hourly and put on vancomycin pending final cultures. He had grown moderate gram-negative rods, and later found to be rare diphtheroids. I received a call from Dr. Cannon Kettle the podiatrist and we  discussed the above. On 03/10/2015 he was  found positive for influenza a and has been put on Tamiflu. Since his discharge he has been seen by Dr. Cannon Kettle who is planning to remove his sutures this coming week. He was reviewed by Dr. [name] on 03/17/2015 and his Diflucan was stopped and vancomycin. After the 3 doses he is taking. He is going to change Ceftazidime to Zosyn 3.375 g IV every 8 hours. 03/25/2015 -- he was seen by the podiatrist a couple of days ago and the sutures were removed. She will follow back with him in 4 weeks' time and at that time x-rays will be taken and a custom molded insert would be made for his shoe. 04/01/2015 -- he was seen by Dr. [name] on 03/29/2015 who pulled the PICC line stopped his IV antibiotics and recommended starting doxycycline [name], [name]. ([id number]) and levofloxacin for 2 weeks. He also stop the fluconazole. The patient will follow up with him only when necessary. Readmission: 03/15/17 on evaluation today patient presents for initial evaluation concerning the new issues although he has previously been evaluated in our clinic. Unfortunately the previous evaluation led to the patient having to proceed to amputation and so he is somewhat nervous about being here today. With that being said he has a very slight blister that occurred on the plantar aspect more medial on the right great toe that has been present for just a very short amount of time, several days. With that being said he felt like initially when he called that he somewhat overreacted but due to the fact that his previous issue led to amputation he is very cautious these days I explained that he did the right thing. With that being said he has been tolerating the dressing changes without complication mainly he just been covering this. He does not have any discomfort in secondary to neuropathy it's unlikely that he feels much. With that being said he does tell me that the issue that he had here is that he went barefoot when he  knows he should not have which subsequently led to the blisters. He states is definitely not doing that anymore. His most recent hemoglobin A1c was December and registered 6.9 his ABI today was 1.1. 03/27/17 on evaluation today patient appears to be doing great in regard to his right great toe ulcer. He has been tolerating the dressing changes without complication. The good news is this is making excellent progress and he seems to be caring for this in an excellent fashion. I see no evidence of breakdown that would make me concerned that he was at risk for infection/amputation. This has obviously been his concern due to the fifth toe amputation that was necessitated previous when he had a similar issue. Nonetheless this seems to be progressing much more nicely. Readmission: 03/13/2019 upon evaluation today patient presents for reevaluation here in the clinic concerning issues he has been having with his right foot on the lateral portion at the proximal end of the metatarsal. Subsequently he tells me that this just opened up in the past couple of days or so and the erythema really began about 24 to 48 hours ago. Fortunately there is no signs of systemic infection. 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 That left him with this bony prominence that is where the wound is currently. There is erythema surrounding that does have me concerned about cellulitis. With that being said he was noncompressible  as far as ABIs are concerned I do think we can need to check into arterial studies at this point. Patient does have a history of hypertension along with diabetes mellitus type 2. He also tends to develop quite a bit of callus white often. 03/20/2019 upon evaluation today patient appears to be doing very well in regard to his wounds currently. He has been tolerating the dressing changes without complication. Fortunately there is no signs of infection. His ABIs  were good at this point and registered at 1.07 on the left and 1.09 on the right. In regard to the x-ray this was negative as well for any signs of osteomyelitis. The patient also seems to be doing better in regard to the infection. He still has several days of the antibiotic left at this point best the Bactrim DS and he seems to be doing very well with this. Overall I am extremely pleased with the progress in 1 week's time he is going require some sharp debridement today however. 03/27/2019 upon evaluation today patient actually appears to be making some progress in regard to his wound. It is a little deeper but measuring smaller which is good news due to the fact that again were clear away some of the necrotic tissue which is why the depth is increasing a little bit. Nonetheless after like were getting very close to being down at a good wound bed. Fortunately there is no signs of active infection at this time. There is a little bit of erythema immediately surrounding the wound that we do want to be very cognizant of and careful about. For that reason I am going to go ahead and extend his antibiotic today for an additional 10 days that is the Bactrim. 04/03/2019 upon evaluation today patient appears to be making some progress. I do feel like the Annitta Needs is doing a good job along with the alginate is being packed into the wound space behind. He has been tolerating the dressing changes without complication. Fortunately there is no signs of active infection at this time. No fevers, chills, nausea, vomiting, or diarrhea. Electronic Signature(s) Signed: 04/03/2019 8:32:39 AM By: [name] PA-C Entered By: [name] on 04/03/2019 08:32:38 [name] ([id number]) -------------------------------------------------------------------------------- Physical Exam Details Patient Name: [name], [name] Date of Service: 04/03/2019 8:00 AM Medical Record Number: [id number] Patient Account  Number: [id number] Date of Birth/Sex: 28-Nov-1965 (54 y.o. M) Treating RN: [name] Primary Care Provider: [name] Other Clinician: Referring Provider: [name] Treating Provider/Extender: STONE III, Cece Milhouse Weeks in Treatment: 3 Constitutional Well-nourished and well-hydrated in no acute distress. Respiratory normal breathing without difficulty. Psychiatric this patient is able to make decisions and demonstrates good insight into disease process. Alert and Oriented x 3. pleasant and cooperative. Notes Patient's wound bed currently did require some sharp debridement clear away some necrotic tissue from the base of the wound he tolerated this today without complication post debridement wound bed appears to be doing much better which is great news. Electronic Signature(s) Signed: 04/03/2019 8:32:56 AM By: [name] PA-C Entered By: [name] on 04/03/2019 08:32:55 [name] ([id number]) -------------------------------------------------------------------------------- Physician Orders Details Patient Name: [name] Date of Service: 04/03/2019 8:00 AM Medical Record Number: [id number] Patient Account Number: [id number] Date of Birth/Sex: 05-19-1965 (54 y.o. 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 - in office o Dial antibacterial soap, wash wounds, rinse and pat dry prior to dressing  wounds Primary Wound Dressing Wound #4 Right,Lateral Foot o Santyl Ointment o Silver Alginate - on top of santyl Secondary Dressing Wound #4 Right,Lateral Foot o Gauze and Kerlix/Conform Dressing Change Frequency Wound #4 Right,Lateral Foot o Change dressing every day. Edema Control Wound #4 Right,Lateral Foot o Patient to wear own compression stockings - wear daily Off-Loading Wound #4 Right,Lateral Foot o Open toe surgical shoe with peg assist. Electronic Signature(s) Signed: 04/03/2019 3:58:38 PM By: [name] Signed: 04/03/2019 5:29:46 PM By: [name] PA-C Entered By: [name] on 04/03/2019 08:28:55 [name] ([id number]) -------------------------------------------------------------------------------- Problem List Details Patient Name: SHLOMO, BRYS. Date of Service: 04/03/2019 8:00 AM Medical Record Number: [id number] Patient Account Number: [id number] Date of Birth/Sex: [birth year] (54 y.o. 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 M) Treating RN: [name] Primary Care Provider: [name] Other Clinician: Referring Provider: [name] Treating Provider/Extender: Melburn Hake, Shimshon Narula Weeks in Treatment: 3 Subjective Chief Complaint Information obtained from Patient Right later foot ulcer History of Present Illness (HPI) This 54 year old male comes with an ulcerated area on the plantar aspect of the right foot which she's had for approximately a month. I have known him from a previous visit at [facility] wound center and was treated in the months of April and May 2016 and rapidly healed a left plantar ulcer with a total contact cast. He has been a diabetic for about 16 years and tries to keep active and is fairly compliant with his diabetes management. 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 Past medical history significant for hypertension, hyperlipidemia, and status post appendectomy 1993. He does not smoke or drink alcohol. 01/14/2015 -- the patient had tolerated his total contact cast very well and had no problems and has had no systemic symptoms. However when his total contact cast was cut open he had excessive amount of purulent drainage in spite of being on antibiotics. He had had a recent x-ray done in the ER 12/22/2014 which showed IMPRESSION:No evidence of osseous erosion. Known soft tissue ulceration is not well characterized on radiograph. Scattered vascular calcifications seen. his last hemoglobin A1c in December was 7.3. He has been on Augmentin and doxycycline for the last 2 weeks. 01/21/2015 -- his culture grew rare growth of Pantoea species an MR moderate growth of Candida  parapsilosis. it is sensitive to levofloxacin. He has not heard back from the insurance company regarding his hyperbaric oxygen therapy. His MRI has not been done yet and we will try and  get him an earlier date 01/28/2015 -- MRI was done last night -- IMPRESSION:1. Soft tissue ulcer overlying the plantar aspect of the fifth metatarsal head extending to the cortex. Subcortical marrow edema in the fifth metatarsal head with corresponding T1 hypointensity is concerning for early osteomyelitis of the plantar lateral aspect of the fifth metatarsal head. Chest x-ray done on 01/14/2015 shows bronchiectatic changes without infiltrate. EKG done on generally 17 2017 shows a normal sinus rhythm and is a normal EKG. 02/04/2015 -- he was asked to see Dr. [name] last week and had 2 appointments but had to cancel both due to pressures of work. Last night he has woken up with severe pain in the foot and leg and it is swollen up. No fever or no change in his blood glucose. Addendum: I spoke with Dr. [name] who kindly agreed to accept the patient for inpatient therapy and have also opened to the hospitalist Dr. [name], and discuss details of the management including PICC line and repeat cultures. 02/12/2015-- -- was seen by Dr. [name] in the hospital and a PICC line was placed. He was to receive Ceftazidime 2 g every 12 hourly, oral levofloxacin 750 mg every 24 hourly and oral fluconazole 200 mg daily. The antibiotics were to be given for 4 weeks except the Diflucan was to be given for the first 2 weeks. Reviewed note from 02/10/2015 -- and Dr. [name] had recommended management for growth of MSSA and Serratia. He switched him from ceftazidime to ceftriaxone 2 g every 24 hours. Levofloxacin was stopped and he would continue on fluconazole for another week. He had asked me to decide whether further imaging was necessary and whether surgical debridement of the infected bone was needed. He is doing well and has been off work for this week and we will keep him off the next week. 02/22/2015 -- he was seen by my colleague on 01/19/2015 and at that time an incision and drainage was done on his  right lateral forefoot on the dorsum. Today when I probed this wound it is frankly draining pus and it communicates with the ulcer on the plantar aspect of his right foot. The patient is still on IV ceftriaxone 2 g every 24 hours and is to be seen by Dr. [name] on Friday. 03/19/2015 -- On 03/04/2015 I spoke to Dr. 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 He recommended operative debridement which would probably result in the fifth metatarsal head and toe amputation.The patient would be referred back to [place] once he was done with surgery. He was admitted to [facility] yesterday and had surgery done by podiatry for a right fifth metatarsal acute osteomyelitis with cellulitis and abscess. He had a right foot incision and drainage with fifth metatarsal partial amputation and removal of toe infected bone and soft tissue with cultures. The wound was partially closed and packing of the distal end was done. Patient was already on cefepime 2 g IV every 8 hourly and put on vancomycin pending final cultures. He had grown moderate gram-negative rods, and later found to be rare diphtheroids. I received a call from Dr.  Stover the podiatrist and we discussed the above. On 03/10/2015 he was found positive for influenza a and has been put on Tamiflu. Since his discharge he has been seen by Dr. Cannon Kettle who is planning to remove his sutures this coming week. He was reviewed by Dr. [name] on 03/17/2015 and his Diflucan was stopped and vancomycin. After the 3 doses he is taking. He is going to change Ceftazidime to Zosyn 3.375 g IV every 8 hours. [name], [name] ([id number]) 03/25/2015 -- he was seen by the podiatrist a couple of days ago and the sutures were removed. She will follow back with him in 4 weeks' time and at that time x-rays  will be taken and a custom molded insert would be made for his shoe. 04/01/2015 -- he was seen by Dr. [name] on 03/29/2015 who pulled the PICC line stopped his IV antibiotics and recommended starting doxycycline and levofloxacin for 2 weeks. He also stop the fluconazole. The patient will follow up with him only when necessary. Readmission: 03/15/17 on evaluation today patient presents for initial evaluation concerning the new issues although he has previously been evaluated in our clinic. Unfortunately the previous evaluation led to the patient having to proceed to amputation and so he is somewhat nervous about being here today. With that being said he has a very slight blister that occurred on the plantar aspect more medial on the right great toe that has been present for just a very short amount of time, several days. With that being said he felt like initially when he called that he somewhat overreacted but due to the fact that his previous issue led to amputation he is very cautious these days I explained that he did the right thing. With that being said he has been tolerating the dressing changes without complication mainly he just been covering this. He does not have any discomfort in secondary to neuropathy it's unlikely that he feels much. With that being said he does tell me that the issue that he had here is that he went barefoot when he knows he should not have which subsequently led to the blisters. He states is definitely not doing that anymore. His most recent hemoglobin A1c was December and registered 6.9 his ABI today was 1.1. 03/27/17 on evaluation today patient appears to be doing great in regard to his right great toe ulcer. He has been tolerating the dressing changes without complication. The good news is this is making excellent progress and he seems to be caring for this in an excellent fashion. I see no evidence of breakdown that would make me concerned that he was at risk for  infection/amputation. This has obviously been his concern due to the fifth toe amputation that was necessitated previous when he had a similar issue. Nonetheless this seems to be progressing much more nicely. Readmission: 03/13/2019 upon evaluation today patient presents for reevaluation here in the clinic concerning issues he has been having with his right foot on the lateral portion at the proximal end of the metatarsal. Subsequently he tells me that this just opened up in the past couple of days or so and the erythema really began about 24 to 48 hours ago. Fortunately there is no signs of systemic infection. 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 That left him with this bony prominence that is where the wound is currently. There is erythema surrounding that does have me concerned about cellulitis. With that  being said he was noncompressible as far as ABIs are concerned I do think we can need to check into arterial studies at this point. Patient does have a history of hypertension along with diabetes mellitus type 2. He also tends to develop quite a bit of callus white often. 03/20/2019 upon evaluation today patient appears to be doing very well in regard to his wounds currently. He has been tolerating the dressing changes without complication. Fortunately there is no signs of infection. His ABIs were good at this point and registered at 1.07 on the left and 1.09 on the right. In regard to the x-ray this was negative as well for any signs of osteomyelitis. The patient also seems to be doing better in regard to the infection. He still has several days of the antibiotic left at this point best the Bactrim DS and he seems to be doing very well with this. Overall I am extremely pleased with the progress in 1 week's time he is going require some sharp debridement today however. 03/27/2019 upon evaluation today patient actually appears to be making some  progress in regard to his wound. It is a little deeper but measuring smaller which is good news due to the fact that again were clear away some of the necrotic tissue which is why the depth is increasing a little bit. Nonetheless after like were getting very close to being down at a good wound bed. Fortunately there is no signs of active infection at this time. There is a little bit of erythema immediately surrounding the wound that we do want to be very cognizant of and careful about. For that reason I am going to go ahead and extend his antibiotic today for an additional 10 days that is the Bactrim. 04/03/2019 upon evaluation today patient appears to be making some progress. I do feel like the Annitta Needs is doing a good job along with the alginate is being packed into the wound space behind. He has been tolerating the dressing changes without complication. Fortunately there is no signs of active infection at this time. No fevers, chills, nausea, vomiting, or diarrhea. Objective Constitutional Well-nourished and well-hydrated in no acute distress. Vitals Time Taken: 8:14 AM, Height: 74 in, Weight: 308 lbs, BMI: 39.5, Temperature: 98.1 F, Pulse: 79 bpm, Respiratory Rate: 16 breaths/min, Blood Pressure: 139/58 mmHg. Respiratory normal breathing without difficulty. Psychiatric this patient is able to make decisions and demonstrates good insight into disease process. Alert and Oriented x 3. pleasant and cooperative. [name], [name] ([id number]) General Notes: Patient's wound bed currently did require some sharp debridement clear away some necrotic tissue from the base of the wound he tolerated this today without complication post debridement wound bed appears to be doing much better which is great news. Integumentary (Hair, Skin) Wound #4 status is Open. Original cause of wound was Gradually Appeared. The wound is located on the Right,Lateral Foot. The wound measures 0.8cm length x 0.8cm width x  0.5cm depth; 0.503cm^2 area and 0.251cm^3 volume. There is Fat Layer (Subcutaneous Tissue) Exposed exposed. There is no tunneling or undermining noted. There is a medium amount of serous drainage noted. The wound margin is flat and intact. There is medium (34-66%) pink granulation within the wound bed. There is a medium (34-66%) amount of necrotic tissue within the wound bed including Adherent Slough. Assessment Active Problems ICD-10 Type 2 diabetes mellitus with foot ulcer 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d Corns and callosities Essential (primary)  hypertension Acquired absence of other left toe(s) Procedures Wound #4 Pre-procedure diagnosis of Wound #4 is a Diabetic Wound/Ulcer of the Lower Extremity located on the Right,Lateral Foot .Severity of Tissue Pre Debridement is: Fat layer exposed. There was a Excisional Skin/Subcutaneous Tissue Debridement with a total area of 0.64 sq cm performed by STONE III, Kristoff Coonradt E., PA-C. With the following instrument(s): Curette to remove Viable and Non-Viable tissue/material. Material removed includes Callus and Subcutaneous Tissue and after achieving pain control using Lidocaine. A time out was conducted at 08:26, prior to the start of the procedure. A Minimum amount of bleeding was controlled with Pressure. The procedure was tolerated well. Post Debridement Measurements: 0.8cm length x 0.8cm width x 0.5cm depth; 0.251cm^3 volume. Character of Wound/Ulcer Post Debridement is stable. Severity of Tissue Post Debridement is: Fat layer exposed. Post procedure Diagnosis Wound #4: Same as Pre-Procedure Plan Wound Cleansing: Wound #4 Right,Lateral Foot: Clean wound with Normal Saline. - in office Dial antibacterial soap, wash wounds, rinse and pat dry prior to dressing wounds Primary Wound Dressing: Wound #4 Right,Lateral Foot: Santyl Ointment Silver Alginate - on top of santyl Secondary Dressing: Wound #4  Right,Lateral Foot: Gauze and Kerlix/Conform Dressing Change Frequency: Wound #4 Right,Lateral Foot: Change dressing every day. Edema Control: Wound #4 Right,Lateral Foot: Patient to wear own compression stockings - wear daily Off-Loading: [name], [name] ([id number]) Wound #4 Right,Lateral Foot: Open toe surgical shoe with peg assist. 1. My suggestion is good to be that we continue with the Santyl with a silver alginate and behind. I do believe this is doing a good job for the patient. 2. I am also can recommend at this time that he continue to change this daily. 3. I am also can recommend that he continue with the postop surgical shoe with peg assist for offloading to prevent pressure to this region. Overall I feel like he is headed in the right direction. We will see patient back for reevaluation in 1 week here in the clinic. If anything worsens or changes patient will contact our office for additional recommendations. Electronic Signature(s) Signed: 04/03/2019 8:33:23 AM By: [name] PA-C Entered By: [name] on 04/03/2019 08:33:23 [name] ([id number]) -------------------------------------------------------------------------------- SuperBill Details Patient Name: [name] Date of Service: 04/03/2019 Medical Record Number: [id number] Patient Account Number: [id number] Date of Birth/Sex: 09-Oct-1965 (54 y.o. M) Treating RN: [name] Primary Care Provider: [name] Other Clinician: Referring Provider: [name] Treating Provider/Extender: Melburn Hake, Markevious Ehmke Weeks in Treatment: 3 Diagnosis Coding ICD-10 Codes Code Description E11.621 Type 2 diabetes mellitus with foot ulcer L97.512 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d L84 Corns and callosities I10 Essential (primary) hypertension Z89.422 Acquired absence of other left toe(s) Facility Procedures CPT4 Code: [id number] Description:  [id number] - DEB SUBQ TISSUE 20 SQ CM/< Modifier: Quantity: 1 CPT4 Code: Description: ICD-10 Diagnosis Description L97.512 Non-pressure chronic ulcer of other part of right foot with fat layer expos Modifier: ed Quantity: Physician Procedures CPT4 [id number] Description: 11042 - WC PHYS SUBQ TISS 20 SQ CM Modifier: Quantity: 1 CPT4 Code: Description: ICD-10 Diagnosis Description L97.512 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 Modifier: d Quantity: Electronic Signature(s) Signed: 04/03/2019 8:33:33 AM By: [name] PA-C Entered By: [name] on 04/03/2019 08:33:32

## 2019-04-07 ENCOUNTER — Encounter: Payer: Self-pay | Admitting: Neurology

## 2019-04-08 ENCOUNTER — Encounter: Payer: Self-pay | Admitting: Neurology

## 2019-04-08 ENCOUNTER — Other Ambulatory Visit: Payer: Self-pay

## 2019-04-08 ENCOUNTER — Ambulatory Visit: Payer: Managed Care, Other (non HMO) | Admitting: Neurology

## 2019-04-08 VITALS — BP 159/72 | HR 83 | Temp 97.6°F | Ht 74.0 in | Wt 308.0 lb

## 2019-04-08 DIAGNOSIS — R42 Dizziness and giddiness: Secondary | ICD-10-CM

## 2019-04-08 DIAGNOSIS — Z89421 Acquired absence of other right toe(s): Secondary | ICD-10-CM | POA: Insufficient documentation

## 2019-04-08 DIAGNOSIS — E1152 Type 2 diabetes mellitus with diabetic peripheral angiopathy with gangrene: Secondary | ICD-10-CM

## 2019-04-08 DIAGNOSIS — G4710 Hypersomnia, unspecified: Secondary | ICD-10-CM

## 2019-04-08 DIAGNOSIS — E11621 Type 2 diabetes mellitus with foot ulcer: Secondary | ICD-10-CM | POA: Diagnosis not present

## 2019-04-08 DIAGNOSIS — Z794 Long term (current) use of insulin: Secondary | ICD-10-CM

## 2019-04-08 DIAGNOSIS — E66811 Obesity, class 1: Secondary | ICD-10-CM | POA: Insufficient documentation

## 2019-04-08 DIAGNOSIS — I499 Cardiac arrhythmia, unspecified: Secondary | ICD-10-CM

## 2019-04-08 DIAGNOSIS — K922 Gastrointestinal hemorrhage, unspecified: Secondary | ICD-10-CM

## 2019-04-08 DIAGNOSIS — L97509 Non-pressure chronic ulcer of other part of unspecified foot with unspecified severity: Secondary | ICD-10-CM

## 2019-04-08 DIAGNOSIS — M869 Osteomyelitis, unspecified: Secondary | ICD-10-CM

## 2019-04-08 DIAGNOSIS — D500 Iron deficiency anemia secondary to blood loss (chronic): Secondary | ICD-10-CM | POA: Insufficient documentation

## 2019-04-08 DIAGNOSIS — G4730 Sleep apnea, unspecified: Secondary | ICD-10-CM

## 2019-04-08 DIAGNOSIS — E1169 Type 2 diabetes mellitus with other specified complication: Secondary | ICD-10-CM

## 2019-04-08 NOTE — Patient Instructions (Signed)

## 2019-04-08 NOTE — Progress Notes (Signed)
SLEEP MEDICINE CLINIC    Provider:  Larey Seat, MD  Primary Care Physician:  [name], [place] [address]     Referring Provider: [name], [address] [place],  [place] [address]          Chief Complaint according to patient   Patient presents with:    . New Patient (Initial Visit)     pt alone, rm 10. pt presents today pt states that he was initially evaluated for diabetic concerns and while work up was being completed he was asked if he snores and has trouble sleeping in which he answered yes to both. pt also admits to waking up gasping for air. He has never had a PSG.      HISTORY OF PRESENT ILLNESS:  [name] is a 54 year old Caucasian male patient seen here as a referral on 04/08/2019 from Dr. Dagmar Hait for an evaluation of snoring and non restorative sleep. He has started to sleep in a recliner.   Chief concern according to patient : " My back bothers me, and we just bought a whole new mattress".  He also reports he needs to be reclined and his wife has RLS .    I have the pleasure of seeing [name] today, a right -handed Caucasian male with a possible sleep disorder. He  has a past medical history of Broken ankle, Broken arm, Diabetes mellitus without complication ([place]), History of Bell's palsy, Hyperlipidemia, Hypertension, and Osteomyelitis ([place]). DM diagnosed at age 21.   Sleep relevant medical history: Nocturia:  2-3, Sleep walking in childhood, lower back pain,   Family medical /sleep history:No other family members on CPAP with OSA, younger brother is a loud snorer.    Social history:  Patient is working at a desk and currently from home- and lives in a household with 3 persons- wife and 80 year old daughter. He has 3 grown kids. 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 Pets are present. Tobacco use- never .  ETOH use ; never , Caffeine intake in form of Coffee( 2 coffees)  Soda( diet coke ) Tea ( rare) or energy drinks. Regular exercise in form of walking - he has foot pain. Diabetic Ulcer. On insulin.       Sleep habits are as follows: The patient's dinner time is between 6.30  PM. The patient goes to bed at 10.30 PM and continues to sleep for 3 hours, wakes for several bathroom breaks, the first time at 2 AM.  He changes to the recliner. With TV on.  The preferred sleep position is reclined , with the support of 2-3 pillows. Dreams are reportedly frequent/vivid.  6 AM is the usual rise time. He takes the 53 year old to school. The patient wakes up spontaneously-never with  an alarm.  He reports feeling refreshed or restored in AM, with symptoms of residual fatigue. Naps are taken in frequently. Total sleep time is 7 hours.    Review of Systems: Out of a complete 14 system review, the patient complains of only the following symptoms, and all other reviewed systems are negative.:  Fatigue, sleepiness , snoring, fragmented sleep, Insomnia with shifting from bed to recliner. Back pain  , diabetic foot ulcer.   Recent lower GI bleed, and anemia slowly recovering- he had colonoscopy [id number] and had polyps removed.   Orthostatic dizziness, tinnitus.    How likely are you to  doze in the following situations: 0 = not likely, 1 = slight chance, 2 = moderate chance, 3 = high chance   Sitting and Reading? Watching Television? Sitting inactive in a public place (theater or meeting)? As a passenger in a car for an hour without a break? Lying down in the afternoon when circumstances permit? Sitting and talking to someone? Sitting quietly after lunch without alcohol? In a car, while stopped for a few minutes in traffic?   Total = 5/ 24 points   FSS endorsed at 27/ 63 points.   Social History   Socioeconomic History  . Marital status: Married    Spouse name: Not on file  . Number of children: 3  . Years of education: Not on file  . Highest education level:  Not on file  Occupational History  . Not on file  Tobacco Use  . Smoking status: Never Smoker  . Smokeless tobacco: Never Used  Substance and Sexual Activity  . Alcohol use: No  . Drug use: No  . Sexual activity: Not on file  Other Topics Concern  . Not on file  Social History Narrative  . Not on file   Social Determinants of Health   Financial Resource Strain:   . Difficulty of Paying Living Expenses:   Food Insecurity:   . Worried About Charity fundraiser in the Last Year:   . Arboriculturist in the Last Year:   Transportation Needs:   . Film/video editor (Medical):   Marland Kitchen Lack of Transportation (Non-Medical):   Physical Activity:   . Days of Exercise per Week:   . Minutes of Exercise per Session:   Stress:   . Feeling of Stress :   Social Connections:   . Frequency of Communication with Friends and Family:   . Frequency of Social Gatherings with Friends and Family:   . Attends Religious Services:   . Active Member of Clubs or Organizations:   . Attends Archivist Meetings:   Marland Kitchen Marital Status:     Family History  Problem Relation Age of Onset  . Diabetes Father   . Colon cancer Father   . Colon polyps Father   . Esophageal cancer Neg Hx   . Rectal cancer Neg Hx   . Stomach cancer Neg Hx     Past Medical History:  Diagnosis Date  . Broken ankle   . Broken arm   . Diabetes mellitus without complication ([place])    type 2  . History of Bell's palsy   . Hyperlipidemia   . Hypertension   . Osteomyelitis ([place])    foot    Past Surgical History:  Procedure Laterality Date  . AMPUTATION Right 03/04/2015   Procedure: PARTIAL AMPUTATION RIGHT 5TH METATARSAL;  Surgeon: Landis Martins, DPM;  Location: [facility];  Service: Podiatry;  Laterality: Right;  . APPENDECTOMY    . COLONOSCOPY  11/17/2005   TAs - [facility]  . COLONOSCOPY N/A 01/25/2019   Procedure: COLONOSCOPY;  Surgeon: Juanita Craver, MD;  Location[facility] ENDOSCOPY;  Service: Endoscopy;  Laterality:  N/A;  . EYE SURGERY Bilateral    blood vessels -cautery  . HEMOSTASIS CLIP PLACEMENT  01/25/2019   Procedure: HEMOSTASIS CLIP PLACEMENT;  Surgeon: Juanita Craver, MD;  [facility] END[facility]COPY;  Service: Endoscopy;;  . I & D EXTREMITY Right 03/04/2015   Procedure: IRRIGATION AND DEBRIDEMENT RIGHT FOOT;  Surgeon: Landis Martins, DPM;  Location: [facility];  Service: Podiatry;  Laterality: Right;  .  WISDOM TOOTH EXTRACTION     only 2 ext     Current Outpatient Medications on File Prior to Visit  Medication Sig Dispense Refill  . empagliflozin (JARDIANCE) 25 MG TABS tablet Take 25 mg by mouth daily.    . fluticasone (FLONASE ALLERGY RELIEF) 50 MCG/ACT nasal spray Place 2 sprays into both nostrils daily as needed for allergies or rhinitis.    Marland Kitchen glimepiride (AMARYL) 2 MG tablet Take 2 mg by mouth 2 (two) times daily.    . Insulin Degludec (TRESIBA FLEXTOUCH) 200 UNIT/ML SOPN Inject 100 Units into the skin daily before breakfast.     . metFORMIN (GLUCOPHAGE) 1000 MG tablet Take 1,000 mg by mouth 2 (two) times daily.    . Multiple Vitamin (MULTIVITAMIN) tablet Take 1 tablet by mouth daily.    . Olmesartan-Amlodipine-HCTZ 40-10-25 MG TABS Take 1 tablet by mouth daily. Reported on 02/04/2015    . omega-3 acid ethyl esters (LOVAZA) 1 g capsule Take 1 capsule by mouth 2 (two) times daily.    . rosuvastatin (CRESTOR) 10 MG tablet Take 10 mg by mouth daily.     No current facility-administered medications on file prior to visit.    No Known Allergies  Physical exam:  Today's Vitals   04/08/19 0830  BP: (!) 159/72  Pulse: 83  Temp: 97.6 F (36.4 C)  Weight: (!) 308 lb (139.7 kg)  Height: 6\' 2"  (1.88 m)   Body mass index is 39.54 kg/m.   Wt Readings from Last 3 Encounters:  04/08/19 (!) 308 lb (139.7 kg)  01/25/19 (!) 306 lb 0.2 oz (138.8 kg)  01/17/19 (!) 310 lb (140.6 kg)     Ht Readings from Last 3 Encounters:  04/08/19 6\' 2"  (1.88 m)  01/25/19 6\' 2"  (1.88 m)  01/17/19 6\' 2"  (1.88 m)        General: The patient is awake, alert and appears not in acute distress. The patient is well groomed. Head: Normocephalic, atraumatic. Neck is supple. Mallampati 2- lateral crowding.  ,  neck circumference: inches 19.5  . Nasal airflow congestion.  Retrognathia is not  seen.  Dental status: intact.  Cardiovascular:   pulse without distended neck veins. Heart beat is irregular.  Respiratory: Lungs are clear to auscultation.  Skin:  Without evidence of ankle edema, or rash. Trunk: The patient's posture is erect.   Neurologic exam : The patient is awake and alert, oriented to place and time.   Memory subjective described as intact.  Attention span & concentration ability appears normal.  Speech is fluent,  without  dysarthria, dysphonia or aphasia.  Mood and affect are appropriate.   Cranial nerves: no loss of smell or taste reported  Pupils are equal and briskly reactive to light. Funduscopic exam deferred.  Extraocular movements in vertical and horizontal planes were intact and without nystagmus. No Diplopia. Visual fields by finger perimetry are intact. Hearing was intact to soft voice and finger rubbing.    Facial sensation intact to fine touch.  Facial motor strength is symmetric and tongue and uvula move midline.  Neck ROM : rotation, tilt and flexion extension were normal for age and shoulder shrug was symmetrical.    Motor exam:  Symmetric bulk, tone and ROM.   Normal tone without cog wheeling, symmetric grip strength . Sensory:  Fine touch, pinprick and vibration were reduced.in both feet.  Proprioception tested in the upper extremities was normal. Coordination: Rapid alternating movements in the fingers/hands were of normal speed.  The  Finger-to-nose maneuver was intact without evidence of ataxia, dysmetria or tremor.   Gait and station: Patient could rise unassisted from a seated position, walked without assistive device.  Stance is of normal width/ base and the patient  turned with 4 steps.  Toe and heel walk were deferred.  Deep tendon reflexes: in the  upper and lower extremities are symmetrically attenuated, the right achilles reflex was deferred. Babinski response was deferred .        After spending a total time of 45 minutes face to face and additional time for physical and neurologic examination, review of laboratory studies,  personal review of imaging studies, reports and results of other testing and review of referral information / records as far as provided in visit, I have established the following assessments:  [name].today Almost 20-year history of diabetes, he has struggled with morbid obesity, he also has lower back pain but not necessarily joint pain in knees and hip in January of this year underwent a colonoscopy and after several polyps were removed he developed a lower GI bleed.  A repeat colonoscopy was necessary to cauterize the vessels that were bleeding at the time.  He also has developed a diabetic foot ulcer and he is chronically taking Bactrim at this time.  He has a history of a broken ankle in 1984.  History of Bell's palsy in February 2011, he was diagnosed with a tubular adenoma and a colonoscopy in 2017.  An irregular heartbeat has been found here today but I think is his dizziness has improved in relation to his improvement of anemia.  Hemoglobin was significantly lower than normal and he has regained a lot of his red blood cells now.  I am referring him for a sleep evaluation which I would prefer to be done as an attended sleep study.  I prefer the patient to have bedroom 2 with an adjustable bed available for him for his comfort and for his reported orthopnea-lower back pain.  In the meantime he should continue iron supplements as tolerated iron rich diet has been reviewed with Dr. Zenia Resides, also I like for him to try to get a vitamin B complex.  This can be over-the-counter purchased the idea is that he ate get some additional  blood building ingredients.  I often recommend prenatal vitamins  An After Visit Summary was printed and given to the patient.    My Plan is to proceed with:  1) attended sleep study referred to evaluate a correct heart rate and rhythm in relation to possible apnea, and snoring.  2) if [employer] wil not allow for in lab testing, will need to resume HST.   I would like to thank [name], MD and [name], [address] [place],  [place] [address] for allowing me to meet with and to take care of this pleasant patient.   I plan to follow up either personally or through our NP within 2 month.   CC: I will share my notes with PCP, DR. Dr. Dagmar Hait, and Dr Margarita Grizzle. .  Electronically signed by: Larey Seat, MD 04/08/2019 9:05 AM  [facility] Board certified by The [employer] of Sleep Medicine and Diplomate of the [employer] of Sleep Medicine. Board certified In Neurology through the [place], Fellow of the [employer] of Neurology. Medical Director of [facility].

## 2019-04-10 ENCOUNTER — Encounter: Payer: Managed Care, Other (non HMO) | Admitting: Physician Assistant

## 2019-04-10 ENCOUNTER — Other Ambulatory Visit: Payer: Self-pay

## 2019-04-10 DIAGNOSIS — E11621 Type 2 diabetes mellitus with foot ulcer: Secondary | ICD-10-CM | POA: Diagnosis not present

## 2019-04-10 NOTE — Progress Notes (Addendum)
HAKEEM, URSIN ([id number]) Visit Report for 04/10/2019 Chief Complaint Document Details Patient Name: [name], [name]. Date of Service: 04/10/2019 8:00 AM Medical Record Number: [id number] Patient Account Number: [id number] Date of Birth/Sex: [birth year] (54 y.o. M) Treating RN: [name] Primary Care Provider: [name] Other Clinician: Referring Provider: [name] Treating Provider/Extender: Melburn Hake, Juliah Scadden Weeks in Treatment: 4 Information Obtained from: Patient Chief Complaint Right lateral foot ulcer Electronic Signature(s) Signed: 04/10/2019 8:17:04 AM By: [name] PA-C Previous Signature: 04/10/2019 8:16:33 AM Version By: [name] PA-C Entered By: [name] on 04/10/2019 08:17:04 [name] ([id number]) -------------------------------------------------------------------------------- Debridement Details Patient Name: [name] Date of Service: 04/10/2019 8:00 AM Medical Record Number: [id number] Patient Account Number: [id number] Date of Birth/Sex: Aug 10, 1965 (54 y.o. M) Treating RN: [name] Primary Care Provider: [name] Other Clinician: Referring Provider: [name] Treating Provider/Extender: Melburn Hake, Pharaoh Pio Weeks in Treatment: 4 Debridement Performed for Wound #4 Right,Lateral Foot Assessment: Performed By: Physician STONE III, Kanye Depree E., PA-C Debridement Type: Debridement Severity of Tissue Pre Debridement: Fat layer exposed Level of Consciousness (Pre- Awake and Alert procedure): Pre-procedure Verification/Time Out Yes - 08:21 Taken: Start Time: 08:22 Pain Control: Lidocaine Total Area Debrided (L x W): 0.7 (cm) x 0.8 (cm) = 0.56 (cm) Tissue and other material debrided: Viable, Non-Viable, Callus, Slough, Subcutaneous, Slough Level: Skin/Subcutaneous Tissue Debridement Description: Excisional Instrument: Curette Bleeding: Minimum Hemostasis Achieved: Pressure End Time:  08:23 Response to Treatment: Procedure was tolerated well Level of Consciousness (Post- Awake and Alert procedure): Post Debridement Measurements of Total Wound Length: (cm) 0.7 Width: (cm) 0.8 Depth: (cm) 0.4 Volume: (cm) 0.176 Character of Wound/Ulcer Post Debridement: Stable Severity of Tissue Post Debridement: Fat layer exposed Post Procedure Diagnosis Same as Pre-procedure Electronic Signature(s) Signed: 04/10/2019 1:30:06 PM By: [name] Signed: 04/11/2019 10:31:16 AM By: [name] PA-C Entered By: [name] on 04/10/2019 08:22:57 [name] ([id number]) -------------------------------------------------------------------------------- HPI Details Patient Name: [name] Date of Service: 04/10/2019 8:00 AM Medical Record Number: [id number] Patient Account Number: [id number] Date of Birth/Sex: [birth year] (54 y.o. M) Treating RN: [name] Primary Care Provider: [name] Other Clinician: Referring Provider: [name] Treating Provider/Extender: Melburn Hake, Shelina Luo Weeks in Treatment: 4 History of Present Illness HPI Description: This 54 year old male comes with an ulcerated area on the plantar aspect of the right foot which she's had for approximately a month. I have known him from a previous visit at [facility] wound center and was treated in the months of April and May 2016 and rapidly healed a left plantar ulcer with a total contact cast. He has been a diabetic for about 16 years and tries to keep active and is fairly compliant with his diabetes management. 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 Past medical history significant for hypertension, hyperlipidemia, and status post appendectomy 1993. He does not smoke or drink alcohol. 01/14/2015 -- the patient had tolerated his total contact cast very well and had no problems and has had no systemic symptoms. However when his total contact cast was cut open he had excessive amount  of purulent drainage in spite of being on antibiotics. He had had a recent x-ray done in the ER 12/22/2014 which showed IMPRESSION:No evidence of osseous erosion. Known soft tissue ulceration is not well characterized on radiograph. Scattered vascular calcifications seen. his last hemoglobin A1c in December was 7.3. He has been on Augmentin and doxycycline for the last 2 weeks. 01/21/2015 -- his culture grew rare growth  of Pantoea species an MR moderate growth of Candida parapsilosis. it is sensitive to levofloxacin. He has not heard back from the insurance company regarding his hyperbaric oxygen therapy. His MRI has not been done yet and we will try and get him an earlier date 01/28/2015 -- MRI was done last night -- IMPRESSION:1. Soft tissue ulcer overlying the plantar aspect of the fifth metatarsal head extending to the cortex. Subcortical marrow edema in the fifth metatarsal head with corresponding T1 hypointensity is concerning for early osteomyelitis of the plantar lateral aspect of the fifth metatarsal head. Chest x-ray done on 01/14/2015 shows bronchiectatic changes without infiltrate. EKG done on generally 17 2017 shows a normal sinus rhythm and is a normal EKG. 02/04/2015 -- he was asked to see Dr. [name] last week and had 2 appointments but had to cancel both due to pressures of work. Last night he has woken up with severe pain in the foot and leg and it is swollen up. No fever or no change in his blood glucose. Addendum: I spoke with Dr. [name] who kindly agreed to accept the patient for inpatient therapy and have also opened to the hospitalist Dr. [name], and discuss details of the management including PICC line and repeat cultures. 02/12/2015-- -- was seen by Dr. [name] in the hospital and a PICC line was placed. He was to receive Ceftazidime 2 g every 12 hourly, oral levofloxacin 750 mg every 24 hourly and oral fluconazole 200 mg daily. The antibiotics were to be given for 4  weeks except the Diflucan was to be given for the first 2 weeks. Reviewed note from 02/10/2015 -- and Dr. [name] had recommended management for growth of MSSA and Serratia. He switched him from ceftazidime to ceftriaxone 2 g every 24 hours. Levofloxacin was stopped and he would continue on fluconazole for another week. He had asked me to decide whether further imaging was necessary and whether surgical debridement of the infected bone was needed. He is doing well and has been off work for this week and we will keep him off the next week. 02/22/2015 -- he was seen by my colleague on 01/19/2015 and at that time an incision and drainage was done on his right lateral forefoot on the dorsum. Today when I probed this wound it is frankly draining pus and it communicates with the ulcer on the plantar aspect of his right foot. The patient is still on IV ceftriaxone 2 g every 24 hours and is to be seen by Dr. [name] on Friday. 03/19/2015 -- On 03/04/2015 I spoke to Dr. 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 He recommended operative debridement which would probably result in the fifth metatarsal head and toe amputation.The patient would be referred back to [place] once he was done with surgery. He was admitted to [facility] yesterday and had surgery done by podiatry for a right fifth metatarsal acute osteomyelitis with cellulitis and abscess. He had a right foot incision and drainage with fifth metatarsal partial amputation and removal of toe infected bone and soft tissue with cultures. The wound was partially closed and packing of the distal end was done. Patient was already on cefepime 2 g IV every 8 hourly and put on vancomycin pending final cultures. He had grown moderate gram-negative rods, and later found to be  rare diphtheroids. I  received a call from Dr. Cannon Kettle the podiatrist and we discussed the above. On 03/10/2015 he was found positive for influenza a and has been put on Tamiflu. Since his discharge he has been seen by Dr. Cannon Kettle who is planning to remove his sutures this coming week. He was reviewed by Dr. [name] on 03/17/2015 and his Diflucan was stopped and vancomycin. After the 3 doses he is taking. He is going to change Ceftazidime to Zosyn 3.375 g IV every 8 hours. 03/25/2015 -- he was seen by the podiatrist a couple of days ago and the sutures were removed. She will follow back with him in 4 weeks' time and at that time x-rays will be taken and a custom molded insert would be made for his shoe. 04/01/2015 -- he was seen by Dr. [name] on 03/29/2015 who pulled the PICC line stopped his IV antibiotics and recommended starting doxycycline [name], [name]. ([id number]) and levofloxacin for 2 weeks. He also stop the fluconazole. The patient will follow up with him only when necessary. Readmission: 03/15/17 on evaluation today patient presents for initial evaluation concerning the new issues although he has previously been evaluated in our clinic. Unfortunately the previous evaluation led to the patient having to proceed to amputation and so he is somewhat nervous about being here today. With that being said he has a very slight blister that occurred on the plantar aspect more medial on the right great toe that has been present for just a very short amount of time, several days. With that being said he felt like initially when he called that he somewhat overreacted but due to the fact that his previous issue led to amputation he is very cautious these days I explained that he did the right thing. With that being said he has been tolerating the dressing changes without complication mainly he just been covering this. He does not have any discomfort in secondary to neuropathy it's unlikely that he feels much. With  that being said he does tell me that the issue that he had here is that he went barefoot when he knows he should not have which subsequently led to the blisters. He states is definitely not doing that anymore. His most recent hemoglobin A1c was December and registered 6.9 his ABI today was 1.1. 03/27/17 on evaluation today patient appears to be doing great in regard to his right great toe ulcer. He has been tolerating the dressing changes without complication. The good news is this is making excellent progress and he seems to be caring for this in an excellent fashion. I see no evidence of breakdown that would make me concerned that he was at risk for infection/amputation. This has obviously been his concern due to the fifth toe amputation that was necessitated previous when he had a similar issue. Nonetheless this seems to be progressing much more nicely. Readmission: 03/13/2019 upon evaluation today patient presents for reevaluation here in the clinic concerning issues he has been having with his right foot on the lateral portion at the proximal end of the metatarsal. Subsequently he tells me that this just opened up in the past couple of days or so and the erythema really began about 24 to 48 hours ago. Fortunately there is no signs of systemic infection. 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 That left him with this bony prominence that is where the wound is currently. There is erythema surrounding that  does have me concerned about cellulitis. With that being said he was noncompressible as far as ABIs are concerned I do think we can need to check into arterial studies at this point. Patient does have a history of hypertension along with diabetes mellitus type 2. He also tends to develop quite a bit of callus white often. 03/20/2019 upon evaluation today patient appears to be doing very well in regard to his wounds currently. He has been tolerating  the dressing changes without complication. Fortunately there is no signs of infection. His ABIs were good at this point and registered at 1.07 on the left and 1.09 on the right. In regard to the x-ray this was negative as well for any signs of osteomyelitis. The patient also seems to be doing better in regard to the infection. He still has several days of the antibiotic left at this point best the Bactrim DS and he seems to be doing very well with this. Overall I am extremely pleased with the progress in 1 week's time he is going require some sharp debridement today however. 03/27/2019 upon evaluation today patient actually appears to be making some progress in regard to his wound. It is a little deeper but measuring smaller which is good news due to the fact that again were clear away some of the necrotic tissue which is why the depth is increasing a little bit. Nonetheless after like were getting very close to being down at a good wound bed. Fortunately there is no signs of active infection at this time. There is a little bit of erythema immediately surrounding the wound that we do want to be very cognizant of and careful about. For that reason I am going to go ahead and extend his antibiotic today for an additional 10 days that is the Bactrim. 04/03/2019 upon evaluation today patient appears to be making some progress. I do feel like the Annitta Needs is doing a good job along with the alginate is being packed into the wound space behind. He has been tolerating the dressing changes without complication. Fortunately there is no signs of active infection at this time. No fevers, chills, nausea, vomiting, or diarrhea. 04/10/2019 upon evaluation today patient appears to be making progress. The wound is not quite as deep but he still does not have a great wound surface yet for working on this and the Santyl does seem to be cleaning things up. Fortunately there is no evidence of active infection at this time.  No fevers, chills, nausea, vomiting, or diarrhea. Electronic Signature(s) Signed: 04/10/2019 8:41:17 AM By: [name] PA-C Entered By: [name] on 04/10/2019 08:41:17 [name] ([id number]) -------------------------------------------------------------------------------- Physical Exam Details Patient Name: [name], [name] Date of Service: 04/10/2019 8:00 AM Medical Record Number: [id number] Patient Account Number: [id number] Date of Birth/Sex: 1965/12/13 (54 y.o. M) Treating RN: [name] Primary Care Provider: [name] Other Clinician: Referring Provider: [name] Treating Provider/Extender: STONE III, Moni Rothrock Weeks in Treatment: 4 Constitutional Well-nourished and well-hydrated in no acute distress. Respiratory normal breathing without difficulty. Psychiatric this patient is able to make decisions and demonstrates good insight into disease process. Alert and Oriented x 3. pleasant and cooperative. Notes Upon inspection patient's wound bed actually showed signs of good granulation at this point. Fortunately there is no evidence of infection which is great news and overall the patient seems to be doing quite well. I am very pleased with the progress here. Electronic Signature(s) Signed: 04/10/2019 8:42:08 AM By: [name]  PA-C Entered By: [name] on 04/10/2019 08:42:08 [name] ([id number]) -------------------------------------------------------------------------------- Physician Orders Details Patient Name: [name] Date of Service: 04/10/2019 8:00 AM Medical Record Number: [id number] Patient Account Number: [id number] Date of Birth/Sex: 03/08/1965 (54 y.o. 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 - in office o Dial antibacterial soap, wash wounds, rinse and pat dry prior to dressing wounds Primary Wound Dressing Wound #4 Right,Lateral Foot o Santyl Ointment o Silver Alginate - on top of santyl Secondary Dressing Wound #4 Right,Lateral Foot o Gauze and Kerlix/Conform Dressing Change Frequency Wound #4 Right,Lateral Foot o Change dressing every day. Edema Control Wound #4 Right,Lateral Foot o Patient to wear own compression stockings - wear daily Off-Loading Wound #4 Right,Lateral Foot o Open toe surgical shoe with peg assist. Electronic Signature(s) Signed: 04/10/2019 1:30:06 PM By: [name] Signed: 04/11/2019 10:31:16 AM By: [name] PA-C Entered By: [name] on 04/10/2019 08:23:59 [name] ([id number]) -------------------------------------------------------------------------------- Problem List Details Patient Name: MAVERICK, PONTING. Date of Service: 04/10/2019 8:00 AM Medical Record Number: [id number] Patient Account Number: [id number] Date of Birth/Sex: 01/20/1965 (54 y.o. 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 M) Treating RN: [name] Primary Care Provider: [name] Other Clinician: Referring Provider: [name] Treating Provider/Extender: Melburn Hake, Oswell Say Weeks in Treatment: 4 Subjective Chief Complaint Information obtained from Patient Right lateral foot ulcer History of Present Illness (HPI) This 54 year old male comes with an ulcerated area on the plantar aspect of the right foot which she's had for approximately a month. I have known him from a previous visit at [facility] wound center and was treated in the months of April and May 2016 and rapidly healed a left plantar ulcer with a total contact cast. He has been a diabetic for about 16 years and tries to keep active and is fairly compliant with his diabetes management. 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 Past medical history significant for hypertension, hyperlipidemia, and status post appendectomy 1993. He does not smoke or drink alcohol. 01/14/2015 -- the patient had tolerated his total contact cast very well and had no problems and has had no systemic symptoms. However when his total contact cast was cut open he had excessive amount of  purulent drainage in spite of being on antibiotics. He had had a recent x-ray done in the ER 12/22/2014 which showed IMPRESSION:No evidence of osseous erosion. Known soft tissue ulceration is not well characterized  on radiograph. Scattered vascular calcifications seen. his last hemoglobin A1c in December was 7.3. He has been on Augmentin and doxycycline for the last 2 weeks. 01/21/2015 -- his culture grew rare growth of Pantoea species an MR moderate growth of Candida parapsilosis. it is sensitive to levofloxacin. He has not heard back from the insurance company regarding his hyperbaric oxygen therapy. His MRI has not been done yet and we will try and get him an earlier date 01/28/2015 -- MRI was done last night -- IMPRESSION:1. Soft tissue ulcer overlying the plantar aspect of the fifth metatarsal head extending to the cortex. Subcortical marrow edema in the fifth metatarsal head with corresponding T1 hypointensity is concerning for early osteomyelitis of the plantar lateral aspect of the fifth metatarsal head. Chest x-ray done on 01/14/2015 shows bronchiectatic changes without infiltrate. EKG done on generally 17 2017 shows a normal sinus rhythm and is a normal EKG. 02/04/2015 -- he was asked to see Dr. [name] last week and had 2 appointments but had to cancel both due to pressures of work. Last night he has woken up with severe pain in the foot and leg and it is swollen up. No fever or no change in his blood glucose. Addendum: I spoke with Dr. [name] who kindly agreed to accept the patient for inpatient therapy and have also opened to the hospitalist Dr. [name], and discuss details of the management including PICC line and repeat cultures. 02/12/2015-- -- was seen by Dr. [name] in the hospital and a PICC line was placed. He was to receive Ceftazidime 2 g every 12 hourly, oral levofloxacin 750 mg every 24 hourly and oral fluconazole 200 mg daily. The antibiotics were to be given for 4 weeks except the Diflucan was to be given for the first 2 weeks. Reviewed note from 02/10/2015 -- and Dr. [name] had recommended management for growth of MSSA and Serratia. He switched him from ceftazidime to  ceftriaxone 2 g every 24 hours. Levofloxacin was stopped and he would continue on fluconazole for another week. He had asked me to decide whether further imaging was necessary and whether surgical debridement of the infected bone was needed. He is doing well and has been off work for this week and we will keep him off the next week. 02/22/2015 -- he was seen by my colleague on 01/19/2015 and at that time an incision and drainage was done on his right lateral forefoot on the dorsum. Today when I probed this wound it is frankly draining pus and it communicates with the ulcer on the plantar aspect of his right foot. The patient is still on IV ceftriaxone 2 g every 24 hours and is to be seen by Dr. [name] on Friday. 03/19/2015 -- On 03/04/2015 I spoke to Dr. 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 He recommended operative debridement which would probably result in the fifth metatarsal head and toe amputation.The patient would be referred back to [place] once he was done with surgery. He was admitted to [facility] yesterday and had surgery done by podiatry for a right fifth metatarsal acute osteomyelitis with cellulitis  and abscess. He had a right foot incision and drainage with fifth metatarsal partial amputation and removal of toe infected bone and soft tissue with cultures. The wound was partially closed and packing of the distal end was done. Patient was already on cefepime 2 g IV every 8 hourly and put on vancomycin pending final cultures. He had grown moderate gram-negative rods, and later found to be rare diphtheroids. I received a call from Dr. Cannon Kettle the podiatrist and we discussed the above. On 03/10/2015 he was found positive for influenza a and has been put on Tamiflu. Since his discharge he has been seen by Dr. Cannon Kettle who is  planning to remove his sutures this coming week. He was reviewed by Dr. [name] on 03/17/2015 and his Diflucan was stopped and vancomycin. After the 3 doses he is taking. He is going to change Ceftazidime to Zosyn 3.375 g IV every 8 hours. [name], [name] ([id number]) 03/25/2015 -- he was seen by the podiatrist a couple of days ago and the sutures were removed. She will follow back with him in 4 weeks' time and at that time x-rays will be taken and a custom molded insert would be made for his shoe. 04/01/2015 -- he was seen by Dr. [name] on 03/29/2015 who pulled the PICC line stopped his IV antibiotics and recommended starting doxycycline and levofloxacin for 2 weeks. He also stop the fluconazole. The patient will follow up with him only when necessary. Readmission: 03/15/17 on evaluation today patient presents for initial evaluation concerning the new issues although he has previously been evaluated in our clinic. Unfortunately the previous evaluation led to the patient having to proceed to amputation and so he is somewhat nervous about being here today. With that being said he has a very slight blister that occurred on the plantar aspect more medial on the right great toe that has been present for just a very short amount of time, several days. With that being said he felt like initially when he called that he somewhat overreacted but due to the fact that his previous issue led to amputation he is very cautious these days I explained that he did the right thing. With that being said he has been tolerating the dressing changes without complication mainly he just been covering this. He does not have any discomfort in secondary to neuropathy it's unlikely that he feels much. With that being said he does tell me that the issue that he had here is that he went barefoot when he knows he should not have which subsequently led to the blisters. He states is definitely not doing that anymore. His  most recent hemoglobin A1c was December and registered 6.9 his ABI today was 1.1. 03/27/17 on evaluation today patient appears to be doing great in regard to his right great toe ulcer. He has been tolerating the dressing changes without complication. The good news is this is making excellent progress and he seems to be caring for this in an excellent fashion. I see no evidence of breakdown that would make me concerned that he was at risk for infection/amputation. This has obviously been his concern due to the fifth toe amputation that was necessitated previous when he had a similar issue. Nonetheless this seems to be progressing much more nicely. Readmission: 03/13/2019 upon evaluation today patient presents for reevaluation here in the clinic concerning issues he has been having with his right foot on the lateral portion at the proximal end of the metatarsal. Subsequently he tells  me that this just opened up in the past couple of days or so and the erythema really began about 24 to 48 hours ago. Fortunately there is no signs of systemic infection. 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 That left him with this bony prominence that is where the wound is currently. There is erythema surrounding that does have me concerned about cellulitis. With that being said he was noncompressible as far as ABIs are concerned I do think we can need to check into arterial studies at this point. Patient does have a history of hypertension along with diabetes mellitus type 2. He also tends to develop quite a bit of callus white often. 03/20/2019 upon evaluation today patient appears to be doing very well in regard to his wounds currently. He has been tolerating the dressing changes without complication. Fortunately there is no signs of infection. His ABIs were good at this point and registered at 1.07 on the left and 1.09 on the right. In regard to the x-ray this was  negative as well for any signs of osteomyelitis. The patient also seems to be doing better in regard to the infection. He still has several days of the antibiotic left at this point best the Bactrim DS and he seems to be doing very well with this. Overall I am extremely pleased with the progress in 1 week's time he is going require some sharp debridement today however. 03/27/2019 upon evaluation today patient actually appears to be making some progress in regard to his wound. It is a little deeper but measuring smaller which is good news due to the fact that again were clear away some of the necrotic tissue which is why the depth is increasing a little bit. Nonetheless after like were getting very close to being down at a good wound bed. Fortunately there is no signs of active infection at this time. There is a little bit of erythema immediately surrounding the wound that we do want to be very cognizant of and careful about. For that reason I am going to go ahead and extend his antibiotic today for an additional 10 days that is the Bactrim. 04/03/2019 upon evaluation today patient appears to be making some progress. I do feel like the Annitta Needs is doing a good job along with the alginate is being packed into the wound space behind. He has been tolerating the dressing changes without complication. Fortunately there is no signs of active infection at this time. No fevers, chills, nausea, vomiting, or diarrhea. 04/10/2019 upon evaluation today patient appears to be making progress. The wound is not quite as deep but he still does not have a great wound surface yet for working on this and the Santyl does seem to be cleaning things up. Fortunately there is no evidence of active infection at this time. No fevers, chills, nausea, vomiting, or diarrhea. Objective Constitutional Well-nourished and well-hydrated in no acute distress. Vitals Time Taken: 8:04 AM, Height: 74 in, Weight: 308 lbs, BMI: 39.5, Temperature:  98.2 F, Pulse: 79 bpm, Respiratory Rate: 16 breaths/min, Blood Pressure: 163/62 mmHg. Respiratory [name], [name] ([id number]) normal breathing without difficulty. Psychiatric this patient is able to make decisions and demonstrates good insight into disease process. Alert and Oriented x 3. pleasant and cooperative. General Notes: Upon inspection patient's wound bed actually showed signs of good granulation at this point. Fortunately there is no evidence of infection which is great news and overall the patient  seems to be doing quite well. I am very pleased with the progress here. Integumentary (Hair, Skin) Wound #4 status is Open. Original cause of wound was Gradually Appeared. The wound is located on the Right,Lateral Foot. The wound measures 0.7cm length x 0.8cm width x 0.4cm depth; 0.44cm^2 area and 0.176cm^3 volume. There is Fat Layer (Subcutaneous Tissue) Exposed exposed. There is no tunneling or undermining noted. There is a medium amount of serous drainage noted. The wound margin is flat and intact. There is large (67-100%) pink granulation within the wound bed. There is a small (1-33%) amount of necrotic tissue within the wound bed including Adherent Slough. Assessment Active Problems ICD-10 Type 2 diabetes mellitus with foot ulcer 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d Corns and callosities Essential (primary) hypertension Acquired absence of other left toe(s) Procedures Wound #4 Pre-procedure diagnosis of Wound #4 is a Diabetic Wound/Ulcer of the Lower Extremity located on the Right,Lateral Foot .Severity of Tissue Pre Debridement is: Fat layer exposed. There was a Excisional Skin/Subcutaneous Tissue Debridement with a total area of 0.56 sq cm performed by STONE III, Tangi Shroff E., PA-C. With the following instrument(s): Curette to remove Viable and Non-Viable tissue/material. Material removed includes Callus, Subcutaneous Tissue, and  Slough after achieving pain control using Lidocaine. A time out was conducted at 08:21, prior to the start of the procedure. A Minimum amount of bleeding was controlled with Pressure. The procedure was tolerated well. Post Debridement Measurements: 0.7cm length x 0.8cm width x 0.4cm depth; 0.176cm^3 volume. Character of Wound/Ulcer Post Debridement is stable. Severity of Tissue Post Debridement is: Fat layer exposed. Post procedure Diagnosis Wound #4: Same as Pre-Procedure Plan Wound Cleansing: Wound #4 Right,Lateral Foot: Clean wound with Normal Saline. - in office Dial antibacterial soap, wash wounds, rinse and pat dry prior to dressing wounds Primary Wound Dressing: Wound #4 Right,Lateral Foot: Santyl Ointment Silver Alginate - on top of santyl Secondary Dressing: Wound #4 Right,Lateral Foot: Gauze and Kerlix/Conform Dressing Change Frequency: Wound #4 Right,Lateral Foot: Change dressing every day. Edema Control: Wound #4 Right,Lateral Foot: [name], [name] ([id number]) Patient to wear own compression stockings - wear daily Off-Loading: Wound #4 Right,Lateral Foot: Open toe surgical shoe with peg assist. 1. My suggestion at this point is can be that we go ahead and initiate treatment with a continuation of the Santyl with the alginate packed in behind. I think this does seem to help clean up the wound and it is heading in the right direction. 2. I may consider switching to Iodoflex just depending on how things seem to progress over the next week. 3. I am also can recommend at this point that the patient continue to monitor for any signs of infection though things seem to be doing quite well at this point. We will see patient back for reevaluation in 1 week here in the clinic. If anything worsens or changes patient will contact our office for additional recommendations. Electronic Signature(s) Signed: 04/10/2019 8:43:05 AM By: [name] PA-C Entered By: [name]  on 04/10/2019 08:43:04 [name] ([id number]) -------------------------------------------------------------------------------- SuperBill Details Patient Name: [name] Date of Service: 04/10/2019 Medical Record Number: [id number] Patient Account Number: [id number] Date of Birth/Sex: 26-Jun-1965 (54 y.o. M) Treating RN: [name] Primary Care Provider: [name] Other Clinician: Referring Provider: [name] Treating Provider/Extender: Melburn Hake, [place] Weeks in Treatment: 4 Diagnosis Coding ICD-10 Codes Code Description E11.621 Type 2 diabetes mellitus with foot ulcer L97.512 Non-pressure chronic  ulcer of other part of right foot with fat layer exposed L84 Corns and callosities I10 Essential (primary) hypertension Z89.422 Acquired absence of other left toe(s) Facility Procedures CPT4 Code: [id number] Description: [id number] - DEB SUBQ TISSUE 20 SQ CM/< Modifier: Quantity: 1 CPT4 Code: Description: ICD-10 Diagnosis Description L97.512 Non-pressure chronic ulcer of other part of right foot with fat layer expos Modifier: ed Quantity: Physician Procedures CPT4 [id number] Description: 11042 - WC PHYS SUBQ TISS 20 SQ CM Modifier: Quantity: 1 CPT4 Code: Description: ICD-10 Diagnosis Description L97.512 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 Modifier: d Quantity: Electronic Signature(s) Signed: 04/10/2019 8:43:40 AM By: [name] PA-C Entered By: [name] on 04/10/2019 08:43:39

## 2019-04-11 NOTE — Progress Notes (Signed)
MATTHEU, POINTER ([id number]) Visit Report for 04/10/2019 Arrival Information Details Patient Name: [name], ROS. Date of Service: 04/10/2019 8:00 AM Medical Record Number: [id number] Patient Account Number: [id number] Date of Birth/Sex: 1965/10/01 (54 y.o. M) Treating RN: Montey Hora Primary Care Reshard Guillet: [name] Other Clinician: Referring Shawntelle Ungar: [name] Treating Mariamawit Depaoli/Extender: Melburn Hake, HOYT Weeks in Treatment: 4 Visit Information History Since Last Visit Added or deleted any medications: No Patient Arrived: Ambulatory Any new allergies or adverse reactions: No Arrival Time: 08:03 Had a fall or experienced change in No Accompanied By: self activities of daily living that may affect Transfer Assistance: None risk of falls: Patient Identification Verified: Yes Signs or symptoms of abuse/neglect since last visito No Secondary Verification Process Completed: Yes Hospitalized since last visit: No Patient Has Alerts: Yes Implantable device outside of the clinic excluding No Patient Alerts: DMII cellular tissue based products placed in the center since last visit: Has Dressing in Place as Prescribed: Yes Pain Present Now: No Electronic Signature(s) Signed: 04/10/2019 4:13:45 PM By: Montey Hora Entered By: Montey Hora on 04/10/2019 08:03:59 [name] ([id number]) -------------------------------------------------------------------------------- Encounter Discharge Information Details Patient Name: [name] Date of Service: 04/10/2019 8:00 AM Medical Record Number: [id number] Patient Account Number: [id number] Date of Birth/Sex: December 15, 1965 (54 y.o. M) Treating RN: [name] Primary Care Lovelle Lema: [name] Other Clinician: Referring Oval Moralez: [name] Treating Elley Harp/Extender: Melburn Hake, HOYT Weeks in Treatment: 4 Encounter Discharge Information Items Post Procedure Vitals Discharge Condition:  Stable Temperature (F): 98.2 Ambulatory Status: Ambulatory Pulse (bpm): 79 Discharge Destination: Home Respiratory Rate (breaths/min): 16 Transportation: Private Auto Blood Pressure (mmHg): 163/62 Accompanied By: self Schedule Follow-up Appointment: Yes Clinical Summary of Care: Electronic Signature(s) Signed: 04/10/2019 1:30:06 PM By: [name] Entered By: [name] on 04/10/2019 08:25:11 [name] ([id number]) -------------------------------------------------------------------------------- Lower Extremity Assessment Details Patient Name: [name] Date of Service: 04/10/2019 8:00 AM Medical Record Number: [id number] Patient Account Number: [id number] Date of Birth/Sex: [birth year] (54 y.o. M) Treating RN: Montey Hora Primary Care Vanna Shavers: [name] Other Clinician: Referring Jahzara Slattery: [name] Treating Kaniya Trueheart/Extender: STONE III, HOYT Weeks in Treatment: 4 Edema Assessment Assessed: [Left: No] [Right: No] Edema: [Left: N] [Right: o] Vascular Assessment Pulses: Dorsalis Pedis Palpable: [Right:Yes] Electronic Signature(s) Signed: 04/10/2019 4:13:45 PM By: Montey Hora Entered By: Montey Hora on 04/10/2019 08:09:46 [name] ([id number]) -------------------------------------------------------------------------------- Multi Wound Chart Details Patient Name: [name] Date of Service: 04/10/2019 8:00 AM Medical Record Number: [id number] Patient Account Number: [id number] Date of Birth/Sex: [birth year] (55 y.o. M) Treating RN: [name] Primary Care Dennisse Swader: [name] Other Clinician: Referring Ashland Osmer: [name] Treating Juliette Standre/Extender: Melburn Hake, HOYT Weeks in Treatment: 4 Vital Signs Height(in): 74 Pulse(bpm): 79 Weight(lbs): 308 Blood Pressure(mmHg): 163/62 Body Mass Index(BMI): 40 Temperature(F): 98.2 Respiratory Rate(breaths/min): 16 Photos: [N/A:N/A] Wound Location:  Right, Lateral Foot N/A N/A Wounding Event: Gradually Appeared N/A N/A Primary Etiology: Diabetic Wound/Ulcer of the Lower N/A N/A Extremity Comorbid History: Hypertension, Type II Diabetes, N/A N/A Neuropathy Date Acquired: 03/12/2019 N/A N/A Weeks of Treatment: 4 N/A N/A Wound Status: Open N/A N/A Pending Amputation on Yes N/A N/A Presentation: Measurements L x W x D (cm) 0.7x0.8x0.4 N/A N/A Area (cm) : 0.44 N/A N/A Volume (cm) : 0.176 N/A N/A % Reduction in Area: 30.80% N/A N/A % Reduction in Volume: -38.60% N/A N/A Classification: Grade 1 N/A N/A Exudate Amount: Medium N/A N/A Exudate Type: Serous N/A N/A Exudate Color: amber N/A N/A Wound Margin: Flat and Intact N/A N/A  Granulation Amount: Large (67-100%) N/A N/A Granulation Quality: Pink N/A N/A Necrotic Amount: Small (1-33%) N/A N/A Exposed Structures: Fat Layer (Subcutaneous Tissue) N/A N/A Exposed: Yes Fascia: No Tendon: No Muscle: No Joint: No Bone: No Epithelialization: None N/A N/A Treatment Notes Electronic Signature(s) Signed: 04/10/2019 1:30:06 PM By: Shelah Lewandowsky ([id number]) Entered By: [name] on 04/10/2019 08:21:59 [name] ([id number]) -------------------------------------------------------------------------------- [place] Details Patient Name: [name] Date of Service: 04/10/2019 8:00 AM Medical Record Number: [id number] Patient Account Number: [id number] Date of Birth/Sex: [birth year] (54 y.o. M) Treating RN: [name] Primary Care Gemayel Mascio: [name] Other Clinician: Referring Kynsley Whitehouse: [name] Treating Rosenda Geffrard/Extender: Melburn Hake, HOYT Weeks in Treatment: 4 Active Inactive Nutrition Nursing Diagnoses: Impaired glucose control: actual or potential Goals: Patient/caregiver verbalizes understanding of need to maintain therapeutic glucose control per primary care physician Date Initiated: 03/13/2019 Target  Resolution Date: 04/11/2019 Goal Status: Active Interventions: Assess patient nutrition upon admission and as needed per policy Notes: Orientation to the Wound Care Program Nursing Diagnoses: Knowledge deficit related to the wound healing center program Goals: Patient/caregiver will verbalize understanding of the [place] Date Initiated: 03/13/2019 Target Resolution Date: 04/11/2019 Goal Status: Active Interventions: Provide education on orientation to the wound center Notes: Wound/Skin Impairment Nursing Diagnoses: Impaired tissue integrity Goals: Ulcer/skin breakdown will have a volume reduction of 30% by week 4 Date Initiated: 03/13/2019 Target Resolution Date: 04/11/2019 Goal Status: Active Interventions: Assess ulceration(s) every visit Notes: Electronic Signature(s) Signed: 04/10/2019 1:30:06 PM By: [name] Entered By: [name] on 04/10/2019 08:21:27 [name] ([name] ([id number]) -------------------------------------------------------------------------------- Pain Assessment Details Patient Name: [name] Date of Service: 04/10/2019 8:00 AM Medical Record Number: [id number] Patient Account Number: [id number] Date of Birth/Sex: 01-27-1965 (54 y.o. M) Treating RN: Montey Hora Primary Care Zahi Plaskett: [name] Other Clinician: Referring Hurschel Paynter: [name] Treating Pavielle Biggar/Extender: Melburn Hake, HOYT Weeks in Treatment: 4 Active Problems Location of Pain Severity and Description of Pain Patient Has Paino No Site Locations Pain Management and Medication Current Pain Management: Electronic Signature(s) Signed: 04/10/2019 4:13:45 PM By: Montey Hora Entered By: Montey Hora on 04/10/2019 08:04:21 [name] ([id number]) -------------------------------------------------------------------------------- Patient/Caregiver Education Details Patient Name: [name] Date of Service: 04/10/2019 8:00 AM Medical Record Number: [id number] Patient Account Number: [id number] Date of Birth/Gender: 08/29/1965 (54 y.o. M) Treating RN: [name] Primary Care Physician: [name] Other Clinician: Referring Physician: [name] Treating Physician/Extender: Sharalyn Ink in Treatment: 4 Education Assessment Education Provided To: Patient Education Topics Provided Wound/Skin Impairment: Handouts: Caring for Your Ulcer Methods: Demonstration, Explain/Verbal Responses: State content correctly Electronic Signature(s) Signed: 04/10/2019 1:30:06 PM By: [name] Entered By: [name] on 04/10/2019 08:24:29 [name] ([id number]) -------------------------------------------------------------------------------- Wound Assessment Details Patient Name: [name] Date of Service: 04/10/2019 8:00 AM Medical Record Number: [id number] Patient Account Number: [id number] Date of Birth/Sex: 06/10/1965 (54 y.o. M) Treating RN: Montey Hora Primary Care Rachard Isidro: [name] Other Clinician: Referring Neel Buffone: [name] Treating Ventura Leggitt/Extender: Melburn Hake, HOYT Weeks in Treatment: 4 Wound Status Wound Number: 4 Primary Etiology: Diabetic Wound/Ulcer of the Lower Extremity Wound Location: Right, Lateral Foot Wound Status: Open Wounding Event: Gradually Appeared Comorbid History: Hypertension, Type II Diabetes, Neuropathy Date Acquired: 03/12/2019 Weeks Of Treatment: 4 Clustered Wound: No Pending Amputation On Presentation Photos Wound Measurements Length: (cm) 0.7 % Reduc Width: (cm) 0.8 % Reduc Depth: (cm) 0.4 Epithel Area: (cm) 0.44 Tunnel Volume: (cm) 0.176 Underm tion  in Area: 30.8% tion in Volume: -38.6% ialization: None ing: No ining: No Wound Description Classification: Grade 1 Foul Od Wound Margin: Flat and Intact Slough/ Exudate Amount: Medium Exudate Type: Serous Exudate Color:  amber or After Cleansing: No Fibrino Yes Wound Bed Granulation Amount: Large (67-100%) Exposed Structure Granulation Quality: Pink Fascia Exposed: No Necrotic Amount: Small (1-33%) Fat Layer (Subcutaneous Tissue) Exposed: Yes Necrotic Quality: Adherent Slough Tendon Exposed: No Muscle Exposed: No Joint Exposed: No Bone Exposed: No Treatment Notes Wound #4 (Right, Lateral Foot) Notes Santyl, scell, gauze, conform Electronic Signature(s) ADEEL, LOUD ([id number]) Signed: 04/10/2019 4:13:45 PM By: Montey Hora Entered By: Montey Hora on 04/10/2019 08:07:56 [name] ([id number]) -------------------------------------------------------------------------------- Vitals Details Patient Name: [name] Date of Service: 04/10/2019 8:00 AM Medical Record Number: [id number] Patient Account Number: [id number] Date of Birth/Sex: [birth year] (54 y.o. M) Treating RN: Montey Hora Primary Care Barack Nicodemus: [name] Other Clinician: Referring Rami Waddle: [name] Treating Catheryne Deford/Extender: Melburn Hake, HOYT Weeks in Treatment: 4 Vital Signs Time Taken: 08:04 Temperature (F): 98.2 Height (in): 74 Pulse (bpm): 79 Weight (lbs): 308 Respiratory Rate (breaths/min): 16 Body Mass Index (BMI): 39.5 Blood Pressure (mmHg): 163/62 Reference Range: 80 - 120 mg / dl Electronic Signature(s) Signed: 04/10/2019 4:13:45 PM By: Montey Hora Entered By: Montey Hora on 04/10/2019 08:04:15

## 2019-04-17 ENCOUNTER — Other Ambulatory Visit: Payer: Self-pay

## 2019-04-17 ENCOUNTER — Encounter: Payer: Managed Care, Other (non HMO) | Admitting: Physician Assistant

## 2019-04-17 DIAGNOSIS — E11621 Type 2 diabetes mellitus with foot ulcer: Secondary | ICD-10-CM | POA: Diagnosis not present

## 2019-04-17 NOTE — Progress Notes (Addendum)
[name] ([id number]) Visit Report for 04/17/2019 Arrival Information Details Patient Name: [name]. Date of Service: 04/17/2019 8:00 AM Medical Record Number: [id number] Patient Account Number: [id number] Date of Birth/Sex: [birth year] (55 y.o. M) Treating RN: Montey Hora Primary Care Bedelia Pong: [name] Other Clinician: Referring Colin Ellers: [name] Treating Arsenio Schnorr/Extender: Melburn Hake, HOYT Weeks in Treatment: 5 Visit Information History Since Last Visit Added or deleted any medications: No Patient Arrived: Ambulatory Any new allergies or adverse reactions: No Arrival Time: 08:10 Had a fall or experienced change in No Accompanied By: self activities of daily living that may affect Transfer Assistance: None risk of falls: Patient Identification Verified: Yes Signs or symptoms of abuse/neglect since last visito No Secondary Verification Process Completed: Yes Hospitalized since last visit: No Patient Has Alerts: Yes Implantable device outside of the clinic excluding No Patient Alerts: DMII cellular tissue based products placed in the center since last visit: Has Dressing in Place as Prescribed: Yes Has Footwear/Offloading in Place as Prescribed: Yes Right: Surgical Shoe with Pressure Relief Insole Pain Present Now: No Electronic Signature(s) Signed: 04/17/2019 3:40:49 PM By: Montey Hora Entered By: Montey Hora on 04/17/2019 08:12:25 [name] ([id number]) -------------------------------------------------------------------------------- Encounter Discharge Information Details Patient Name: [name] Date of Service: 04/17/2019 8:00 AM Medical Record Number: [id number] Patient Account Number: [id number] Date of Birth/Sex: January 02, 1966 (54 y.o. M) Treating RN: [name] Primary Care Hlee Fringer: [name] Other Clinician: Referring Aivy Akter: [name] Treating Arabella Revelle/Extender: Melburn Hake,  HOYT Weeks in Treatment: 5 Encounter Discharge Information Items Post Procedure Vitals Discharge Condition: Stable Temperature (F): 98.4 Ambulatory Status: Ambulatory Pulse (bpm): 79 Discharge Destination: Home Respiratory Rate (breaths/min): 16 Transportation: Private Auto Blood Pressure (mmHg): 152/65 Accompanied By: self Schedule Follow-up Appointment: Yes Clinical Summary of Care: Electronic Signature(s) Signed: 04/17/2019 9:44:09 AM By: [name] Entered By: [name] on 04/17/2019 08:47:25 [name] ([id number]) -------------------------------------------------------------------------------- Lower Extremity Assessment Details Patient Name: [name] Date of Service: 04/17/2019 8:00 AM Medical Record Number: [id number] Patient Account Number: [id number] Date of Birth/Sex: [birth year] (54 y.o. M) Treating RN: Montey Hora Primary Care Mirai Greenwood: [name] Other Clinician: Referring Anner Baity: [name] Treating Ziara Thelander/Extender: STONE III, HOYT Weeks in Treatment: 5 Edema Assessment Assessed: [Left: No] [Right: No] Edema: [Left: N] [Right: o] Vascular Assessment Pulses: Dorsalis Pedis Palpable: [Right:Yes] Electronic Signature(s) Signed: 04/17/2019 3:40:49 PM By: Montey Hora Entered By: Montey Hora on 04/17/2019 08:13:13 [name] ([id number]) -------------------------------------------------------------------------------- Multi Wound Chart Details Patient Name: [name] Date of Service: 04/17/2019 8:00 AM Medical Record Number: [id number] Patient Account Number: [id number] Date of Birth/Sex: Apr 25, 1965 (54 y.o. M) Treating RN: [name] Primary Care Dyllan Hughett: [name] Other Clinician: Referring Kayla Deshaies: [name] Treating Jehu Mccauslin/Extender: STONE III, HOYT Weeks in Treatment: 5 Vital Signs Height(in): 74 Pulse(bpm): 79 Weight(lbs): 308 Blood Pressure(mmHg): 152/65 Body  Mass Index(BMI): 40 Temperature(F): 98.4 Respiratory Rate(breaths/min): 16 Photos: [N/A:N/A] Wound Location: Right, Lateral Foot N/A N/A Wounding Event: Gradually Appeared N/A N/A Primary Etiology: Diabetic Wound/Ulcer of the Lower N/A N/A Extremity Comorbid History: Hypertension, Type II Diabetes, N/A N/A Neuropathy Date Acquired: 03/12/2019 N/A N/A Weeks of Treatment: 5 N/A N/A Wound Status: Open N/A N/A Pending Amputation on Yes N/A N/A Presentation: Measurements L x W x D (cm) 0.6x0.9x0.5 N/A N/A Area (cm) : 0.424 N/A N/A Volume (cm) : 0.212 N/A N/A % Reduction in Area: 33.30% N/A N/A % Reduction in Volume: -66.90% N/A N/A Classification: Grade 1 N/A N/A Exudate Amount: Medium N/A N/A Exudate Type: Serous  N/A N/A Exudate Color: amber N/A N/A Wound Margin: Flat and Intact N/A N/A Granulation Amount: Large (67-100%) N/A N/A Granulation Quality: Pink N/A N/A Necrotic Amount: Small (1-33%) N/A N/A Exposed Structures: Fat Layer (Subcutaneous Tissue) N/A N/A Exposed: Yes Fascia: No Tendon: No Muscle: No Joint: No Bone: No Epithelialization: None N/A N/A Treatment Notes Electronic Signature(s) Signed: 04/17/2019 9:44:09 AM By: Shelah Lewandowsky ([id number]) Entered By: [name] on 04/17/2019 08:39:57 [name] ([id number]) -------------------------------------------------------------------------------- [place] Details Patient Name: [name] Date of Service: 04/17/2019 8:00 AM Medical Record Number: [id number] Patient Account Number: [id number] Date of Birth/Sex: 03-29-1965 (54 y.o. M) Treating RN: [name] Primary Care Bellarose Burtt: [name] Other Clinician: Referring Dariel Pellecchia: [name] Treating Tylyn Derwin/Extender: Melburn Hake, HOYT Weeks in Treatment: 5 Active Inactive Nutrition Nursing Diagnoses: Impaired glucose control: actual or potential Goals: Patient/caregiver verbalizes  understanding of need to maintain therapeutic glucose control per primary care physician Date Initiated: 03/13/2019 Target Resolution Date: 04/11/2019 Goal Status: Active Interventions: Assess patient nutrition upon admission and as needed per policy Notes: Orientation to the Wound Care Program Nursing Diagnoses: Knowledge deficit related to the wound healing center program Goals: Patient/caregiver will verbalize understanding of the [place] Date Initiated: 03/13/2019 Target Resolution Date: 04/11/2019 Goal Status: Active Interventions: Provide education on orientation to the wound center Notes: Wound/Skin Impairment Nursing Diagnoses: Impaired tissue integrity Goals: Ulcer/skin breakdown will have a volume reduction of 30% by week 4 Date Initiated: 03/13/2019 Target Resolution Date: 04/11/2019 Goal Status: Active Interventions: Assess ulceration(s) every visit Notes: Electronic Signature(s) Signed: 04/17/2019 9:44:09 AM By: [name] Entered By: [name] on 04/17/2019 08:39:49 [name] ([name] ([id number]) -------------------------------------------------------------------------------- Pain Assessment Details Patient Name: [name] Date of Service: 04/17/2019 8:00 AM Medical Record Number: [id number] Patient Account Number: [id number] Date of Birth/Sex: [birth year] (54 y.o. M) Treating RN: Montey Hora Primary Care Donyale Berthold: [name] Other Clinician: Referring Babak Lucus: [name] Treating Laveyah Oriol/Extender: Melburn Hake, HOYT Weeks in Treatment: 5 Active Problems Location of Pain Severity and Description of Pain Patient Has Paino No Site Locations Pain Management and Medication Current Pain Management: Electronic Signature(s) Signed: 04/17/2019 3:40:49 PM By: Montey Hora Entered By: Montey Hora on 04/17/2019 08:12:54 [name]  ([id number]) -------------------------------------------------------------------------------- Patient/Caregiver Education Details Patient Name: [name] Date of Service: 04/17/2019 8:00 AM Medical Record Number: [id number] Patient Account Number: [id number] Date of Birth/Gender: March 12, 1965 (54 y.o. M) Treating RN: [name] Primary Care Physician: [name] Other Clinician: Referring Physician: [name] Treating Physician/Extender: Sharalyn Ink in Treatment: 5 Education Assessment Education Provided To: Patient Education Topics Provided Wound/Skin Impairment: Handouts: Caring for Your Ulcer Methods: Demonstration, Explain/Verbal Responses: State content correctly Electronic Signature(s) Signed: 04/17/2019 9:44:09 AM By: [name] Entered By: [name] on 04/17/2019 08:42:41 [name] ([id number]) -------------------------------------------------------------------------------- Wound Assessment Details Patient Name: [name] Date of Service: 04/17/2019 8:00 AM Medical Record Number: [id number] Patient Account Number: [id number] Date of Birth/Sex: 1965/11/03 (54 y.o. M) Treating RN: Montey Hora Primary Care Kanchan Gal: [name] Other Clinician: Referring Rio Kidane: [name] Treating Jaz Laningham/Extender: STONE III, HOYT Weeks in Treatment: 5 Wound Status Wound Number: 4 Primary Etiology: Diabetic Wound/Ulcer of the Lower Extremity Wound Location: Right, Lateral Foot Wound Status: Open Wounding Event: Gradually Appeared Comorbid History: Hypertension, Type II Diabetes, Neuropathy Date Acquired: 03/12/2019 Weeks Of Treatment: 5 Clustered Wound: No Pending Amputation On Presentation Photos Wound Measurements Length: (cm) 0.6 % Reduc Width: (cm) 0.9 %  Reduc Depth: (cm) 0.5 Epithel Area: (cm) 0.424 Tunnel Volume: (cm) 0.212 Underm tion in Area: 33.3% tion in Volume: -66.9% ialization: None ing:  No ining: No Wound Description Classification: Grade 1 Foul Od Wound Margin: Flat and Intact Slough/ Exudate Amount: Medium Exudate Type: Serous Exudate Color: amber or After Cleansing: No Fibrino Yes Wound Bed Granulation Amount: Large (67-100%) Exposed Structure Granulation Quality: Pink Fascia Exposed: No Necrotic Amount: Small (1-33%) Fat Layer (Subcutaneous Tissue) Exposed: Yes Necrotic Quality: Adherent Slough Tendon Exposed: No Muscle Exposed: No Joint Exposed: No Bone Exposed: No Treatment Notes Wound #4 (Right, Lateral Foot) Notes Santyl, scell, gauze, conform Electronic Signature(s) JABRAYLEN, KRAMPITZ ([id number]) Signed: 04/17/2019 3:40:49 PM By: Montey Hora Entered By: Montey Hora on 04/17/2019 08:17:00 [name] ([id number]) -------------------------------------------------------------------------------- Vitals Details Patient Name: [name] Date of Service: 04/17/2019 8:00 AM Medical Record Number: [id number] Patient Account Number: [id number] Date of Birth/Sex: 1965-10-03 (54 y.o. M) Treating RN: Montey Hora Primary Care Jeran Hiltz: [name] Other Clinician: Referring Orlean Holtrop: [name] Treating Teigen Bellin/Extender: Melburn Hake, HOYT Weeks in Treatment: 5 Vital Signs Time Taken: 08:10 Temperature (F): 98.4 Height (in): 74 Pulse (bpm): 79 Weight (lbs): 308 Respiratory Rate (breaths/min): 16 Body Mass Index (BMI): 39.5 Blood Pressure (mmHg): 152/65 Reference Range: 80 - 120 mg / dl Electronic Signature(s) Signed: 04/17/2019 3:40:49 PM By: Montey Hora Entered By: Montey Hora on 04/17/2019 08:12:47

## 2019-04-18 NOTE — Progress Notes (Signed)
[name], [name] ([id number]) Visit Report for 04/17/2019 Chief Complaint Document Details Patient Name: [name], [name]. Date of Service: 04/17/2019 8:00 AM Medical Record Number: [id number] Patient Account Number: [id number] Date of Birth/Sex: 02-20-1965 (54 y.o. M) Treating RN: [name] Primary Care Provider: [name] Other Clinician: Referring Provider: [name] Treating Provider/Extender: Melburn Hake, Lyndzee Kliebert Weeks in Treatment: 5 Information Obtained from: Patient Chief Complaint Right lateral foot ulcer Electronic Signature(s) Signed: 04/17/2019 5:39:46 PM By: [name] PA-C Entered By: [name] on 04/17/2019 17:39:46 [name] ([id number]) -------------------------------------------------------------------------------- Debridement Details Patient Name: [name] Date of Service: 04/17/2019 8:00 AM Medical Record Number: [id number] Patient Account Number: [id number] Date of Birth/Sex: [birth year] (54 y.o. M) Treating RN: [name] Primary Care Provider: [name] Other Clinician: Referring Provider: [name] Treating Provider/Extender: Melburn Hake, Shekera Beavers Weeks in Treatment: 5 Debridement Performed for Wound #4 Right,Lateral Foot Assessment: Performed By: Physician STONE III, Raja Liska E., PA-C Debridement Type: Debridement Severity of Tissue Pre Debridement: Fat layer exposed Level of Consciousness (Pre- Awake and Alert procedure): Pre-procedure Verification/Time Out Yes - 08:39 Taken: Start Time: 08:40 Pain Control: Lidocaine Total Area Debrided (L x W): 0.6 (cm) x 0.9 (cm) = 0.54 (cm) Tissue and other material debrided: Viable, Non-Viable, Slough, Subcutaneous, Slough Level: Skin/Subcutaneous Tissue Debridement Description: Excisional Instrument: Curette Bleeding: Minimum Hemostasis Achieved: Silver Nitrate End Time: 08:41 Response to Treatment: Procedure was tolerated well Level of  Consciousness (Post- Awake and Alert procedure): Post Debridement Measurements of Total Wound Length: (cm) 0.6 Width: (cm) 0.9 Depth: (cm) 0.5 Volume: (cm) 0.212 Character of Wound/Ulcer Post Debridement: Stable Severity of Tissue Post Debridement: Fat layer exposed Post Procedure Diagnosis Same as Pre-procedure Electronic Signature(s) Signed: 04/17/2019 9:44:09 AM By: [name] Signed: 04/17/2019 5:42:16 PM By: [name] PA-C Entered By: [name] on 04/17/2019 09:00:08 [name] ([id number]) -------------------------------------------------------------------------------- HPI Details Patient Name: [name] Date of Service: 04/17/2019 8:00 AM Medical Record Number: [id number] Patient Account Number: [id number] Date of Birth/Sex: 10-02-1965 (54 y.o. M) Treating RN: [name] Primary Care Provider: [name] Other Clinician: Referring Provider: [name] Treating Provider/Extender: STONE III, Keshanna Riso Weeks in Treatment: 5 History of Present Illness HPI Description: This 54 year old male comes with an ulcerated area on the plantar aspect of the right foot which she's had for approximately a month. I have known him from a previous visit at [facility] wound center and was treated in the months of April and May 2016 and rapidly healed a left plantar ulcer with a total contact cast. He has been a diabetic for about 16 years and tries to keep active and is fairly compliant with his diabetes management. 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 Past medical history significant for hypertension, hyperlipidemia, and status post appendectomy 1993. He does not smoke or drink alcohol. 01/14/2015 -- the patient had tolerated his total contact cast very well and had no problems and has had no systemic symptoms. However when his total contact cast was cut open he had excessive amount of purulent drainage in spite of being on antibiotics. He had had  a recent x-ray done in the ER 12/22/2014 which showed IMPRESSION:No evidence of osseous erosion. Known soft tissue ulceration is not well characterized on radiograph. Scattered vascular calcifications seen. his last hemoglobin A1c in December was 7.3. He has been on Augmentin and doxycycline for the last 2 weeks. 01/21/2015 -- his culture grew rare growth of Pantoea species an MR moderate growth of Candida parapsilosis. it  is sensitive to levofloxacin. He has not heard back from the insurance company regarding his hyperbaric oxygen therapy. His MRI has not been done yet and we will try and get him an earlier date 01/28/2015 -- MRI was done last night -- IMPRESSION:1. Soft tissue ulcer overlying the plantar aspect of the fifth metatarsal head extending to the cortex. Subcortical marrow edema in the fifth metatarsal head with corresponding T1 hypointensity is concerning for early osteomyelitis of the plantar lateral aspect of the fifth metatarsal head. Chest x-ray done on 01/14/2015 shows bronchiectatic changes without infiltrate. EKG done on generally 17 2017 shows a normal sinus rhythm and is a normal EKG. 02/04/2015 -- he was asked to see Dr. [name] last week and had 2 appointments but had to cancel both due to pressures of work. Last night he has woken up with severe pain in the foot and leg and it is swollen up. No fever or no change in his blood glucose. Addendum: I spoke with Dr. [name] who kindly agreed to accept the patient for inpatient therapy and have also opened to the hospitalist Dr. [name], and discuss details of the management including PICC line and repeat cultures. 02/12/2015-- -- was seen by Dr. [name] in the hospital and a PICC line was placed. He was to receive Ceftazidime 2 g every 12 hourly, oral levofloxacin 750 mg every 24 hourly and oral fluconazole 200 mg daily. The antibiotics were to be given for 4 weeks except the Diflucan was to be given for the first 2  weeks. Reviewed note from 02/10/2015 -- and Dr. [name] had recommended management for growth of MSSA and Serratia. He switched him from ceftazidime to ceftriaxone 2 g every 24 hours. Levofloxacin was stopped and he would continue on fluconazole for another week. He had asked me to decide whether further imaging was necessary and whether surgical debridement of the infected bone was needed. He is doing well and has been off work for this week and we will keep him off the next week. 02/22/2015 -- he was seen by my colleague on 01/19/2015 and at that time an incision and drainage was done on his right lateral forefoot on the dorsum. Today when I probed this wound it is frankly draining pus and it communicates with the ulcer on the plantar aspect of his right foot. The patient is still on IV ceftriaxone 2 g every 24 hours and is to be seen by Dr. [name] on Friday. 03/19/2015 -- On 03/04/2015 I spoke to Dr. 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 He recommended operative debridement which would probably result in the fifth metatarsal head and toe amputation.The patient would be referred back to [place] once he was done with surgery. He was admitted to [facility] yesterday and had surgery done by podiatry for a right fifth metatarsal acute osteomyelitis with cellulitis and abscess. He had a right foot incision and drainage with fifth metatarsal partial amputation and removal of toe infected bone and soft tissue with cultures. The wound was partially closed and packing of the distal end was done. Patient was already on cefepime 2 g IV every 8 hourly and put on vancomycin pending final cultures. He had grown moderate gram-negative rods, and later found to be rare diphtheroids. I received a call from Dr. Cannon Kettle the podiatrist  and we  discussed the above. On 03/10/2015 he was found positive for influenza a and has been put on Tamiflu. Since his discharge he has been seen by Dr. Cannon Kettle who is planning to remove his sutures this coming week. He was reviewed by Dr. [name] on 03/17/2015 and his Diflucan was stopped and vancomycin. After the 3 doses he is taking. He is going to change Ceftazidime to Zosyn 3.375 g IV every 8 hours. 03/25/2015 -- he was seen by the podiatrist a couple of days ago and the sutures were removed. She will follow back with him in 4 weeks' time and at that time x-rays will be taken and a custom molded insert would be made for his shoe. 04/01/2015 -- he was seen by Dr. [name] on 03/29/2015 who pulled the PICC line stopped his IV antibiotics and recommended starting doxycycline [name], [name]. ([id number]) and levofloxacin for 2 weeks. He also stop the fluconazole. The patient will follow up with him only when necessary. Readmission: 03/15/17 on evaluation today patient presents for initial evaluation concerning the new issues although he has previously been evaluated in our clinic. Unfortunately the previous evaluation led to the patient having to proceed to amputation and so he is somewhat nervous about being here today. With that being said he has a very slight blister that occurred on the plantar aspect more medial on the right great toe that has been present for just a very short amount of time, several days. With that being said he felt like initially when he called that he somewhat overreacted but due to the fact that his previous issue led to amputation he is very cautious these days I explained that he did the right thing. With that being said he has been tolerating the dressing changes without complication mainly he just been covering this. He does not have any discomfort in secondary to neuropathy it's unlikely that he feels much. With that being said he does tell me that the issue that  he had here is that he went barefoot when he knows he should not have which subsequently led to the blisters. He states is definitely not doing that anymore. His most recent hemoglobin A1c was December and registered 6.9 his ABI today was 1.1. 03/27/17 on evaluation today patient appears to be doing great in regard to his right great toe ulcer. He has been tolerating the dressing changes without complication. The good news is this is making excellent progress and he seems to be caring for this in an excellent fashion. I see no evidence of breakdown that would make me concerned that he was at risk for infection/amputation. This has obviously been his concern due to the fifth toe amputation that was necessitated previous when he had a similar issue. Nonetheless this seems to be progressing much more nicely. Readmission: 03/13/2019 upon evaluation today patient presents for reevaluation here in the clinic concerning issues he has been having with his right foot on the lateral portion at the proximal end of the metatarsal. Subsequently he tells me that this just opened up in the past couple of days or so and the erythema really began about 24 to 48 hours ago. Fortunately there is no signs of systemic infection. 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 That left him with this bony prominence that is where the wound is currently. There is erythema surrounding that does have me concerned about cellulitis. With that being said he  was noncompressible as far as ABIs are concerned I do think we can need to check into arterial studies at this point. Patient does have a history of hypertension along with diabetes mellitus type 2. He also tends to develop quite a bit of callus white often. 03/20/2019 upon evaluation today patient appears to be doing very well in regard to his wounds currently. He has been tolerating the dressing changes without complication.  Fortunately there is no signs of infection. His ABIs were good at this point and registered at 1.07 on the left and 1.09 on the right. In regard to the x-ray this was negative as well for any signs of osteomyelitis. The patient also seems to be doing better in regard to the infection. He still has several days of the antibiotic left at this point best the Bactrim DS and he seems to be doing very well with this. Overall I am extremely pleased with the progress in 1 week's time he is going require some sharp debridement today however. 03/27/2019 upon evaluation today patient actually appears to be making some progress in regard to his wound. It is a little deeper but measuring smaller which is good news due to the fact that again were clear away some of the necrotic tissue which is why the depth is increasing a little bit. Nonetheless after like were getting very close to being down at a good wound bed. Fortunately there is no signs of active infection at this time. There is a little bit of erythema immediately surrounding the wound that we do want to be very cognizant of and careful about. For that reason I am going to go ahead and extend his antibiotic today for an additional 10 days that is the Bactrim. 04/03/2019 upon evaluation today patient appears to be making some progress. I do feel like the Annitta Needs is doing a good job along with the alginate is being packed into the wound space behind. He has been tolerating the dressing changes without complication. Fortunately there is no signs of active infection at this time. No fevers, chills, nausea, vomiting, or diarrhea. 04/10/2019 upon evaluation today patient appears to be making progress. The wound is not quite as deep but he still does not have a great wound surface yet for working on this and the Santyl does seem to be cleaning things up. Fortunately there is no evidence of active infection at this time. No fevers, chills, nausea, vomiting, or  diarrhea. 04/17/2019 upon evaluation today patient appears to be doing excellent at this time in regard to his foot ulcer. I do believe the Santyl with the alginate packed in behind has been of benefit for him and I am very pleased in this regard. With that being said I am feeling like the wound surface is improving quite a bit each time I see him still I do not believe we are at the point of stating that the wound bed is perfect but again were making progress. The patient is going require some sharp debridement today. Electronic Signature(s) Signed: 04/17/2019 5:40:29 PM By: [name] PA-C Entered By: [name] on 04/17/2019 17:40:29 [name] ([id number]) -------------------------------------------------------------------------------- Physical Exam Details Patient Name: [name], [name] Date of Service: 04/17/2019 8:00 AM Medical Record Number: [id number] Patient Account Number: [id number] Date of Birth/Sex: 05-09-1965 (54 y.o. M) Treating RN: [name] Primary Care Provider: [name] Other Clinician: Referring Provider: [name] Treating Provider/Extender: STONE III, Tiaira Arambula Weeks in Treatment: 5 Constitutional Well-nourished and well-hydrated  in no acute distress. Respiratory normal breathing without difficulty. Psychiatric this patient is able to make decisions and demonstrates good insight into disease process. Alert and Oriented x 3. pleasant and cooperative. Notes Upon inspection I did have to perform some sharp debridement to clear away slough and necrotic debris from the surface of the wound this was much looser than last week and there were several areas of good tissue underlying the necrotic tissue. The patient did have one area where he had a small arterial that seem to be bleeding along the plantar aspect of the wound which I did actually have to use a stick of silver nitrate to chemically cauterized to stop the bleeding at this  site. Electronic Signature(s) Signed: 04/17/2019 5:40:59 PM By: [name] PA-C Entered By: [name] on 04/17/2019 17:40:59 [name] ([id number]) -------------------------------------------------------------------------------- Physician Orders Details Patient Name: [name] Date of Service: 04/17/2019 8:00 AM Medical Record Number: [id number] Patient Account Number: [id number] Date of Birth/Sex: 11-May-1965 (54 y.o. M) Treating RN: [name] Primary Care Provider: [name] Other Clinician: Referring Provider: [name] Treating Provider/Extender: Melburn Hake, Khyri Hinzman Weeks in Treatment: 5 Verbal / Phone Orders: No Diagnosis Coding Wound Cleansing Wound #4 Right,Lateral Foot o Clean wound with Normal Saline. - in office o Dial antibacterial soap, wash wounds, rinse and pat dry prior to dressing wounds Primary Wound Dressing Wound #4 Right,Lateral Foot o Santyl Ointment o Silver Alginate - on top of santyl Secondary Dressing Wound #4 Right,Lateral Foot o Gauze and Kerlix/Conform Dressing Change Frequency Wound #4 Right,Lateral Foot o Change dressing every day. Edema Control Wound #4 Right,Lateral Foot o Patient to wear own compression stockings - wear daily Off-Loading Wound #4 Right,Lateral Foot o Open toe surgical shoe with peg assist. Electronic Signature(s) Signed: 04/17/2019 9:44:09 AM By: [name] Signed: 04/17/2019 5:42:16 PM By: [name] PA-C Entered By: [name] on 04/17/2019 08:42:27 [name] ([id number]) -------------------------------------------------------------------------------- Problem List Details Patient Name: AKIEM, KOSKIE. Date of Service: 04/17/2019 8:00 AM Medical Record Number: [id number] Patient Account Number: [id number] Date of Birth/Sex: [birth year] (54 y.o. 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 M) Treating RN: [name] Primary Care Provider: [name] Other Clinician: Referring Provider: [name] Treating Provider/Extender: Melburn Hake, Corinn Stoltzfus Weeks in Treatment: 5 Subjective Chief Complaint Information obtained from Patient Right lateral foot ulcer History of Present Illness (HPI) This 54 year old male comes with an ulcerated area on the plantar aspect of the right foot which she's had for approximately a month. I have known him from a previous visit at [facility] wound center and was treated in the months of April and May 2016 and rapidly healed a left plantar  ulcer with a total contact cast. He has been a diabetic for about 16 years and tries to keep active and is fairly compliant with his diabetes management. 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 Past medical history significant for hypertension, hyperlipidemia, and status post appendectomy 1993. He does not smoke or drink  alcohol. 01/14/2015 -- the patient had tolerated his total contact cast very well and had no problems and has had no systemic symptoms. However when his total contact cast was cut open he had excessive amount of purulent drainage in spite of being on antibiotics. He had had a recent x-ray done in the ER 12/22/2014 which showed IMPRESSION:No evidence of osseous erosion. Known soft tissue ulceration is not well characterized on radiograph. Scattered vascular calcifications seen. his last hemoglobin A1c in December was 7.3. He has been on Augmentin and doxycycline for the last 2 weeks. 01/21/2015 -- his culture grew rare growth of Pantoea species an MR moderate growth of Candida parapsilosis. it is sensitive to levofloxacin. He has not heard back from the insurance company regarding his hyperbaric oxygen therapy. His MRI has not been done yet and we will try and get him an earlier date 01/28/2015 -- MRI was done last night -- IMPRESSION:1. Soft tissue ulcer overlying the plantar aspect of the fifth metatarsal head extending to the cortex. Subcortical marrow edema in the fifth metatarsal head with corresponding T1 hypointensity is concerning for early osteomyelitis of the plantar lateral aspect of the fifth metatarsal head. Chest x-ray done on 01/14/2015 shows bronchiectatic changes without infiltrate. EKG done on generally 17 2017 shows a normal sinus rhythm and is a normal EKG. 02/04/2015 -- he was asked to see Dr. [name] last week and had 2 appointments but had to cancel both due to pressures of work. Last night he has woken up with severe pain in the foot and leg and it is swollen up. No fever or no change in his blood glucose. Addendum: I spoke with Dr. [name] who kindly agreed to accept the patient for inpatient therapy and have also opened to the hospitalist Dr. [name], and discuss details of the management including PICC line and repeat cultures. 02/12/2015-- -- was seen by Dr. [name]  in the hospital and a PICC line was placed. He was to receive Ceftazidime 2 g every 12 hourly, oral levofloxacin 750 mg every 24 hourly and oral fluconazole 200 mg daily. The antibiotics were to be given for 4 weeks except the Diflucan was to be given for the first 2 weeks. Reviewed note from 02/10/2015 -- and Dr. [name] had recommended management for growth of MSSA and Serratia. He switched him from ceftazidime to ceftriaxone 2 g every 24 hours. Levofloxacin was stopped and he would continue on fluconazole for another week. He had asked me to decide whether further imaging was necessary and whether surgical debridement of the infected bone was needed. He is doing well and has been off work for this week and we will keep him off the next week. 02/22/2015 -- he was seen by my colleague on 01/19/2015 and at that time an incision and drainage was done on his right lateral forefoot on the dorsum. Today when I probed this wound it is frankly draining pus and it communicates with the ulcer on the plantar aspect of his right foot. The patient is still on IV ceftriaxone 2 g every 24 hours and is to be seen by Dr. [name] on Friday. 03/19/2015 -- On 03/04/2015 I spoke to Dr. Rodman Key  Wagoner who saw him in the office today and did an x-ray of his right foot and noted that there was osteomyelitis of the right fifth toe and metatarsal and a lot of pus draining from the wound. He recommended operative debridement which would probably result in the fifth metatarsal head and toe amputation.The patient would be referred back to [place] once he was done with surgery. He was admitted to [facility] yesterday and had surgery done by podiatry for a right fifth metatarsal acute osteomyelitis with cellulitis and abscess. He had a right foot incision and drainage with fifth metatarsal partial amputation and removal of toe infected bone and soft tissue with cultures. The wound was partially closed and packing of  the distal end was done. Patient was already on cefepime 2 g IV every 8 hourly and put on vancomycin pending final cultures. He had grown moderate gram-negative rods, and later found to be rare diphtheroids. I received a call from Dr. Cannon Kettle the podiatrist and we discussed the above. On 03/10/2015 he was found positive for influenza a and has been put on Tamiflu. Since his discharge he has been seen by Dr. Cannon Kettle who is planning to remove his sutures this coming week. He was reviewed by Dr. [name] on 03/17/2015 and his Diflucan was stopped and vancomycin. After the 3 doses he is taking. He is going to change Ceftazidime to Zosyn 3.375 g IV every 8 hours. [name], [name] ([id number]) 03/25/2015 -- he was seen by the podiatrist a couple of days ago and the sutures were removed. She will follow back with him in 4 weeks' time and at that time x-rays will be taken and a custom molded insert would be made for his shoe. 04/01/2015 -- he was seen by Dr. [name] on 03/29/2015 who pulled the PICC line stopped his IV antibiotics and recommended starting doxycycline and levofloxacin for 2 weeks. He also stop the fluconazole. The patient will follow up with him only when necessary. Readmission: 03/15/17 on evaluation today patient presents for initial evaluation concerning the new issues although he has previously been evaluated in our clinic. Unfortunately the previous evaluation led to the patient having to proceed to amputation and so he is somewhat nervous about being here today. With that being said he has a very slight blister that occurred on the plantar aspect more medial on the right great toe that has been present for just a very short amount of time, several days. With that being said he felt like initially when he called that he somewhat overreacted but due to the fact that his previous issue led to amputation he is very cautious these days I explained that he did the right thing. With  that being said he has been tolerating the dressing changes without complication mainly he just been covering this. He does not have any discomfort in secondary to neuropathy it's unlikely that he feels much. With that being said he does tell me that the issue that he had here is that he went barefoot when he knows he should not have which subsequently led to the blisters. He states is definitely not doing that anymore. His most recent hemoglobin A1c was December and registered 6.9 his ABI today was 1.1. 03/27/17 on evaluation today patient appears to be doing great in regard to his right great toe ulcer. He has been tolerating the dressing changes without complication. The good news is this is making excellent progress and he seems to be caring for this  in an excellent fashion. I see no evidence of breakdown that would make me concerned that he was at risk for infection/amputation. This has obviously been his concern due to the fifth toe amputation that was necessitated previous when he had a similar issue. Nonetheless this seems to be progressing much more nicely. Readmission: 03/13/2019 upon evaluation today patient presents for reevaluation here in the clinic concerning issues he has been having with his right foot on the lateral portion at the proximal end of the metatarsal. Subsequently he tells me that this just opened up in the past couple of days or so and the erythema really began about 24 to 48 hours ago. Fortunately there is no signs of systemic infection. 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 That left him with this bony prominence that is where the wound is currently. There is erythema surrounding that does have me concerned about cellulitis. With that being said he was noncompressible as far as ABIs are concerned I do think we can need to check into arterial studies at this point. Patient does have a history of hypertension along with  diabetes mellitus type 2. He also tends to develop quite a bit of callus white often. 03/20/2019 upon evaluation today patient appears to be doing very well in regard to his wounds currently. He has been tolerating the dressing changes without complication. Fortunately there is no signs of infection. His ABIs were good at this point and registered at 1.07 on the left and 1.09 on the right. In regard to the x-ray this was negative as well for any signs of osteomyelitis. The patient also seems to be doing better in regard to the infection. He still has several days of the antibiotic left at this point best the Bactrim DS and he seems to be doing very well with this. Overall I am extremely pleased with the progress in 1 week's time he is going require some sharp debridement today however. 03/27/2019 upon evaluation today patient actually appears to be making some progress in regard to his wound. It is a little deeper but measuring smaller which is good news due to the fact that again were clear away some of the necrotic tissue which is why the depth is increasing a little bit. Nonetheless after like were getting very close to being down at a good wound bed. Fortunately there is no signs of active infection at this time. There is a little bit of erythema immediately surrounding the wound that we do want to be very cognizant of and careful about. For that reason I am going to go ahead and extend his antibiotic today for an additional 10 days that is the Bactrim. 04/03/2019 upon evaluation today patient appears to be making some progress. I do feel like the Annitta Needs is doing a good job along with the alginate is being packed into the wound space behind. He has been tolerating the dressing changes without complication. Fortunately there is no signs of active infection at this time. No fevers, chills, nausea, vomiting, or diarrhea. 04/10/2019 upon evaluation today patient appears to be making progress. The wound is  not quite as deep but he still does not have a great wound surface yet for working on this and the Santyl does seem to be cleaning things up. Fortunately there is no evidence of active infection at this time. No fevers, chills, nausea, vomiting, or diarrhea. 04/17/2019 upon evaluation today patient appears to be doing  excellent at this time in regard to his foot ulcer. I do believe the Santyl with the alginate packed in behind has been of benefit for him and I am very pleased in this regard. With that being said I am feeling like the wound surface is improving quite a bit each time I see him still I do not believe we are at the point of stating that the wound bed is perfect but again were making progress. The patient is going require some sharp debridement today. Objective Constitutional Well-nourished and well-hydrated in no acute distress. [name], [name] ([id number]) Vitals Time Taken: 8:10 AM, Height: 74 in, Weight: 308 lbs, BMI: 39.5, Temperature: 98.4 F, Pulse: 79 bpm, Respiratory Rate: 16 breaths/min, Blood Pressure: 152/65 mmHg. Respiratory normal breathing without difficulty. Psychiatric this patient is able to make decisions and demonstrates good insight into disease process. Alert and Oriented x 3. pleasant and cooperative. General Notes: Upon inspection I did have to perform some sharp debridement to clear away slough and necrotic debris from the surface of the wound this was much looser than last week and there were several areas of good tissue underlying the necrotic tissue. The patient did have one area where he had a small arterial that seem to be bleeding along the plantar aspect of the wound which I did actually have to use a stick of silver nitrate to chemically cauterized to stop the bleeding at this site. Integumentary (Hair, Skin) Wound #4 status is Open. Original cause of wound was Gradually Appeared. The wound is located on the Right,Lateral Foot. The wound  measures 0.6cm length x 0.9cm width x 0.5cm depth; 0.424cm^2 area and 0.212cm^3 volume. There is Fat Layer (Subcutaneous Tissue) Exposed exposed. There is no tunneling or undermining noted. There is a medium amount of serous drainage noted. The wound margin is flat and intact. There is large (67-100%) pink granulation within the wound bed. There is a small (1-33%) amount of necrotic tissue within the wound bed including Adherent Slough. Assessment Active Problems ICD-10 Type 2 diabetes mellitus with foot ulcer 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d Corns and callosities Essential (primary) hypertension Acquired absence of other left toe(s) Procedures Wound #4 Pre-procedure diagnosis of Wound #4 is a Diabetic Wound/Ulcer of the Lower Extremity located on the Right,Lateral Foot .Severity of Tissue Pre Debridement is: Fat layer exposed. There was a Excisional Skin/Subcutaneous Tissue Debridement with a total area of 0.54 sq cm performed by STONE III, Kallin Henk E., PA-C. With the following instrument(s): Curette to remove Viable and Non-Viable tissue/material. Material removed includes Subcutaneous Tissue and Slough and after achieving pain control using Lidocaine. A time out was conducted at 08:39, prior to the start of the procedure. A Minimum amount of bleeding was controlled with Silver Nitrate. The procedure was tolerated well. Post Debridement Measurements: 0.6cm length x 0.9cm width x 0.5cm depth; 0.212cm^3 volume. Character of Wound/Ulcer Post Debridement is stable. Severity of Tissue Post Debridement is: Fat layer exposed. Post procedure Diagnosis Wound #4: Same as Pre-Procedure Plan Wound Cleansing: Wound #4 Right,Lateral Foot: Clean wound with Normal Saline. - in office Dial antibacterial soap, wash wounds, rinse and pat dry prior to dressing wounds Primary Wound Dressing: Wound #4 Right,Lateral Foot: Santyl Ointment Silver Alginate - on top of  santyl Secondary Dressing: [name], [name] ([id number]) Wound #4 Right,Lateral Foot: Gauze and Kerlix/Conform Dressing Change Frequency: Wound #4 Right,Lateral Foot: Change dressing every day. Edema Control: Wound #4 Right,Lateral Foot: Patient to wear  own compression stockings - wear daily Off-Loading: Wound #4 Right,Lateral Foot: Open toe surgical shoe with peg assist. 1. I would recommend currently that we continue with the Santyl followed by the alginate packed and behind I think due to the depth of the wound this is doing very well for him. 2. I am also can recommend at this time that the patient continue to monitor for any signs of infection I see nothing today but we will see where things stand next week as well. 3. I am going to suggest he also continue with open toe surgical shoe with peg assist I do believe this has been beneficial for him. We will see patient back for reevaluation in 1 week here in the clinic. If anything worsens or changes patient will contact our office for additional recommendations. Electronic Signature(s) Signed: 04/17/2019 5:41:28 PM By: [name] PA-C Entered By: [name] on 04/17/2019 17:41:28 [name] ([id number]) -------------------------------------------------------------------------------- SuperBill Details Patient Name: [name] Date of Service: 04/17/2019 Medical Record Number: [id number] Patient Account Number: [id number] Date of Birth/Sex: 22-Sep-1965 (54 y.o. M) Treating RN: [name] Primary Care Provider: [name] Other Clinician: Referring Provider: [name] Treating Provider/Extender: Melburn Hake, Yerick Eggebrecht Weeks in Treatment: 5 Diagnosis Coding ICD-10 Codes Code Description E11.621 Type 2 diabetes mellitus with foot ulcer L97.512 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d L84 Corns and callosities I10 Essential (primary)  hypertension Z89.422 Acquired absence of other left toe(s) Facility Procedures CPT4 Code: [id number] Description: [id number] - DEB SUBQ TISSUE 20 SQ CM/< Modifier: Quantity: 1 CPT4 Code: Description: ICD-10 Diagnosis Description L97.512 Non-pressure chronic ulcer of other part of right foot with fat layer expos Modifier: ed Quantity: Physician Procedures CPT4 [id number] Description: 11042 - WC PHYS SUBQ TISS 20 SQ CM Modifier: Quantity: 1 CPT4 Code: Description: ICD-10 Diagnosis Description L97.512 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 Modifier: d Quantity: Electronic Signature(s) Signed: 04/17/2019 5:41:38 PM By: [name] PA-C Entered By: [name] on 04/17/2019 17:41:37

## 2019-04-22 ENCOUNTER — Telehealth: Payer: Self-pay | Admitting: Neurology

## 2019-04-24 ENCOUNTER — Other Ambulatory Visit: Payer: Self-pay

## 2019-04-24 ENCOUNTER — Encounter: Payer: Managed Care, Other (non HMO) | Admitting: Internal Medicine

## 2019-04-24 DIAGNOSIS — E11621 Type 2 diabetes mellitus with foot ulcer: Secondary | ICD-10-CM | POA: Diagnosis not present

## 2019-04-24 NOTE — Progress Notes (Signed)
DELANCE, VANDERZWAAG ([id number]) Visit Report for 04/24/2019 Arrival Information Details Patient Name: [name], [name]. Date of Service: 04/24/2019 8:00 AM Medical Record Number: [id number] Patient Account Number: [id number] Date of Birth/Sex: 06/06/1965 (54 y.o. M) Treating RN: Montey Hora Primary Care Khali Albanese: [name] Other Clinician: Referring Jashira Cotugno: [name] Treating Talan Gildner/Extender: Tito Dine in Treatment: 6 Visit Information History Since Last Visit Added or deleted any medications: No Patient Arrived: Ambulatory Any new allergies or adverse reactions: No Arrival Time: 08:11 Had a fall or experienced change in No Accompanied By: self activities of daily living that may affect Transfer Assistance: None risk of falls: Patient Identification Verified: Yes Signs or symptoms of abuse/neglect since last visito No Secondary Verification Process Completed: Yes Hospitalized since last visit: No Patient Has Alerts: Yes Implantable device outside of the clinic excluding No Patient Alerts: DMII cellular tissue based products placed in the center since last visit: Has Dressing in Place as Prescribed: Yes Pain Present Now: No Electronic Signature(s) Signed: 04/24/2019 4:06:41 PM By: Montey Hora Entered By: Montey Hora on 04/24/2019 08:12:39 [name] ([id number]) -------------------------------------------------------------------------------- Clinic Level of Care Assessment Details Patient Name: [name] Date of Service: 04/24/2019 8:00 AM Medical Record Number: [id number] Patient Account Number: [id number] Date of Birth/Sex: 02/06/1965 (54 y.o. M) Treating RN: [name] Primary Care Amond Speranza: [name] Other Clinician: Referring Advika Mclelland: [name] Treating Donja Tipping/Extender: Tito Dine in Treatment: 6 Clinic Level of Care Assessment Items TOOL 4 Quantity Score []  - Use when  only an EandM is performed on FOLLOW-UP visit 0 ASSESSMENTS - Nursing Assessment / Reassessment X - Reassessment of Co-morbidities (includes updates in patient status) 1 10 X- 1 5 Reassessment of Adherence to Treatment Plan ASSESSMENTS - Wound and Skin Assessment / Reassessment X - Simple Wound Assessment / Reassessment - one wound 1 5 []  - 0 Complex Wound Assessment / Reassessment - multiple wounds []  - 0 Dermatologic / Skin Assessment (not related to wound area) ASSESSMENTS - Focused Assessment []  - Circumferential Edema Measurements - multi extremities 0 []  - 0 Nutritional Assessment / Counseling / Intervention []  - 0 Lower Extremity Assessment (monofilament, tuning fork, pulses) []  - 0 Peripheral Arterial Disease Assessment (using hand held doppler) ASSESSMENTS - Ostomy and/or Continence Assessment and Care []  - Incontinence Assessment and Management 0 []  - 0 Ostomy Care Assessment and Management (repouching, etc.) PROCESS - Coordination of Care X - Simple Patient / Family Education for ongoing care 1 15 []  - 0 Complex (extensive) Patient / Family Education for ongoing care []  - 0 Staff obtains Programmer, systems, Records, Test Results / Process Orders []  - 0 Staff telephones HHA, Nursing Homes / Clarify orders / etc []  - 0 Routine Transfer to another Facility (non-emergent condition) []  - 0 Routine Hospital Admission (non-emergent condition) []  - 0 New Admissions / Biomedical engineer / Ordering NPWT, Apligraf, etc. []  - 0 Emergency Hospital Admission (emergent condition) X- 1 10 Simple Discharge Coordination []  - 0 Complex (extensive) Discharge Coordination PROCESS - Special Needs []  - Pediatric / Minor Patient Management 0 []  - 0 Isolation Patient Management []  - 0 Hearing / Language / Visual special needs []  - 0 Assessment of Community assistance (transportation, D/C planning, etc.) RINGO, DONEGAN ([id number]) []  - 0 Additional assistance / Altered  mentation []  - 0 Support Surface(s) Assessment (bed, cushion, seat, etc.) INTERVENTIONS - Wound Cleansing / Measurement X - Simple Wound Cleansing - one wound 1 5 []  - 0 Complex Wound Cleansing -  multiple wounds X- 1 5 Wound Imaging (photographs - any number of wounds) []  - 0 Wound Tracing (instead of photographs) X- 1 5 Simple Wound Measurement - one wound []  - 0 Complex Wound Measurement - multiple wounds INTERVENTIONS - Wound Dressings []  - Small Wound Dressing one or multiple wounds 0 X- 1 15 Medium Wound Dressing one or multiple wounds []  - 0 Large Wound Dressing one or multiple wounds []  - 0 Application of Medications - topical []  - 0 Application of Medications - injection INTERVENTIONS - Miscellaneous []  - External ear exam 0 []  - 0 Specimen Collection (cultures, biopsies, blood, body fluids, etc.) []  - 0 Specimen(s) / Culture(s) sent or taken to Lab for analysis []  - 0 Patient Transfer (multiple staff / Civil Service fast streamer / Similar devices) []  - 0 Simple Staple / Suture removal (25 or less) []  - 0 Complex Staple / Suture removal (26 or more) []  - 0 Hypo / Hyperglycemic Management (close monitor of Blood Glucose) []  - 0 Ankle / Brachial Index (ABI) - do not check if billed separately X- 1 5 Vital Signs Has the patient been seen at the hospital within the last three years: Yes Total Score: 80 Level Of Care: New/Established - Level 3 Electronic Signature(s) Signed: 04/24/2019 10:18:26 AM By: [name] Entered By: [name] on 04/24/2019 08:23:39 [name] ([id number]) -------------------------------------------------------------------------------- Encounter Discharge Information Details Patient Name: [name] Date of Service: 04/24/2019 8:00 AM Medical Record Number: [id number] Patient Account Number: [id number] Date of Birth/Sex: 08/02/1965 (54 y.o. M) Treating RN: [name] Primary Care Leeba Barbe: [name] Other  Clinician: Referring Tarahji Ramthun: [name] Treating Kieron Kantner/Extender: Tito Dine in Treatment: 6 Encounter Discharge Information Items Discharge Condition: Stable Ambulatory Status: Ambulatory Discharge Destination: Home Transportation: Private Auto Accompanied By: self Schedule Follow-up Appointment: Yes Clinical Summary of Care: Electronic Signature(s) Signed: 04/24/2019 10:18:26 AM By: [name] Entered By: [name] on 04/24/2019 08:24:23 [name] ([id number]) -------------------------------------------------------------------------------- Lower Extremity Assessment Details Patient Name: [name] Date of Service: 04/24/2019 8:00 AM Medical Record Number: [id number] Patient Account Number: [id number] Date of Birth/Sex: [birth year] (54 y.o. M) Treating RN: Montey Hora Primary Care Lyndel Sarate: [name] Other Clinician: Referring Hersey Maclellan: [name] Treating Zabella Wease/Extender: Ricard Dillon Weeks in Treatment: 6 Edema Assessment Assessed: [Left: No] [Right: No] Edema: [Left: N] [Right: o] Vascular Assessment Pulses: Dorsalis Pedis Palpable: [Right:Yes] Electronic Signature(s) Signed: 04/24/2019 4:06:41 PM By: Montey Hora Entered By: Montey Hora on 04/24/2019 08:16:55 [name] ([id number]) -------------------------------------------------------------------------------- Multi Wound Chart Details Patient Name: [name] Date of Service: 04/24/2019 8:00 AM Medical Record Number: [id number] Patient Account Number: [id number] Date of Birth/Sex: [birth year] (54 y.o. M) Treating RN: [name] Primary Care Tiwanna Tuch: [name] Other Clinician: Referring Karlita Lichtman: [name] Treating Zanyla Klebba/Extender: Tito Dine in Treatment: 6 Vital Signs Height(in): 74 Pulse(bpm): 81 Weight(lbs): 308 Blood Pressure(mmHg): 145/61 Body Mass Index(BMI):  40 Temperature(F): 98.3 Respiratory Rate(breaths/min): 16 Photos: [N/A:N/A] Wound Location: Right, Lateral Foot N/A N/A Wounding Event: Gradually Appeared N/A N/A Primary Etiology: Diabetic Wound/Ulcer of the Lower N/A N/A Extremity Comorbid History: Hypertension, Type II Diabetes, N/A N/A Neuropathy Date Acquired: 03/12/2019 N/A N/A Weeks of Treatment: 6 N/A N/A Wound Status: Open N/A N/A Pending Amputation on Yes N/A N/A Presentation: Measurements L x W x D (cm) 0.4x0.8x0.6 N/A N/A Area (cm) : 0.251 N/A N/A Volume (cm) : 0.151 N/A N/A % Reduction in Area: 60.50% N/A N/A % Reduction in Volume: -18.90% N/A N/A Starting  Position 1 (o'clock): 12 Ending Position 1 (o'clock): 6 Maximum Distance 1 (cm): 0.6 Undermining: Yes N/A N/A Classification: Grade 1 N/A N/A Exudate Amount: Medium N/A N/A Exudate Type: Serous N/A N/A Exudate Color: amber N/A N/A Wound Margin: Flat and Intact N/A N/A Granulation Amount: Large (67-100%) N/A N/A Granulation Quality: Pink N/A N/A Necrotic Amount: Small (1-33%) N/A N/A Exposed Structures: Fat Layer (Subcutaneous Tissue) N/A N/A Exposed: Yes Fascia: No Tendon: No Muscle: No Joint: No Bone: No Epithelialization: None N/A N/A Treatment Notes KALA, GRATZ ([id number]) Wound #4 (Right, Lateral Foot) Notes Santyl, scell, gauze, conform Electronic Signature(s) Signed: 04/24/2019 4:21:14 PM By: Linton Ham MD Entered By: Linton Ham on 04/24/2019 08:27:28 [name] -------------------------------------------------------------------------------- Multi-Disciplinary Care Plan Details Patient Name: BINH, MENCIAS. Date of Service: 04/24/2019 8:00 AM Medical Record Number: [id number] Patient Account Number: [id number] Date of Birth/Sex: 11/19/1965 (54 y.o. M) Treating RN: [name] Primary Care Juli Odom: [name] Other Clinician: Referring Tykerria Mccubbins: [name] Treating  Paolina Karwowski/Extender: Tito Dine in Treatment: 6 Active Inactive Nutrition Nursing Diagnoses: Impaired glucose control: actual or potential Goals: Patient/caregiver verbalizes understanding of need to maintain therapeutic glucose control per primary care physician Date Initiated: 03/13/2019 Target Resolution Date: 04/11/2019 Goal Status: Active Interventions: Assess patient nutrition upon admission and as needed per policy Notes: Orientation to the Wound Care Program Nursing Diagnoses: Knowledge deficit related to the wound healing center program Goals: Patient/caregiver will verbalize understanding of the [place] Date Initiated: 03/13/2019 Target Resolution Date: 04/11/2019 Goal Status: Active Interventions: Provide education on orientation to the wound center Notes: Wound/Skin Impairment Nursing Diagnoses: Impaired tissue integrity Goals: Ulcer/skin breakdown will have a volume reduction of 30% by week 4 Date Initiated: 03/13/2019 Target Resolution Date: 04/11/2019 Goal Status: Active Interventions: Assess ulceration(s) every visit Notes: Electronic Signature(s) Signed: 04/24/2019 10:18:26 AM By: [name] Entered By: [name] on 04/24/2019 08:22:31 [name] ([name] -------------------------------------------------------------------------------- Pain Assessment Details Patient Name: [name] Date of Service: 04/24/2019 8:00 AM Medical Record Number: [id number] Patient Account Number: [id number] Date of Birth/Sex: 11-26-1965 (54 y.o. M) Treating RN: Montey Hora Primary Care Celsa Nordahl: [name] Other Clinician: Referring Ambriella Kitt: [name] Treating Sabreena Vogan/Extender: Tito Dine in Treatment: 6 Active Problems Location of Pain Severity and Description of Pain Patient Has Paino No Site Locations Pain Management and Medication Current Pain  Management: Electronic Signature(s) Signed: 04/24/2019 4:06:41 PM By: Montey Hora Entered By: Montey Hora on 04/24/2019 08:14:07 [name] -------------------------------------------------------------------------------- Patient/Caregiver Education Details Patient Name: [name] Date of Service: 04/24/2019 8:00 AM Medical Record Number: [id number] Patient Account Number: [id number] Date of Birth/Gender: October 29, 1965 (54 y.o. M) Treating RN: [name] Primary Care Physician: [name] Other Clinician: Referring Physician: [name] Treating Physician/Extender: Tito Dine in Treatment: 6 Education Assessment Education Provided To: Patient Education Topics Provided Wound/Skin Impairment: Handouts: Caring for Your Ulcer Methods: Demonstration, Explain/Verbal Responses: State content correctly Electronic Signature(s) Signed: 04/24/2019 10:18:26 AM By: [name] Entered By: [name] on 04/24/2019 08:23:58 [name] -------------------------------------------------------------------------------- Wound Assessment Details Patient Name: [name] Date of Service: 04/24/2019 8:00 AM Medical Record Number: [id number] Patient Account Number: [id number] Date of Birth/Sex: [birth year] (54 y.o. M) Treating RN: Montey Hora Primary Care Mohd. Derflinger: [name] Other Clinician: Referring Gregoria Selvy: [name] Treating Herberto Ledwell/Extender: Tito Dine in Treatment: 6 Wound Status Wound Number: 4 Primary Etiology: Diabetic Wound/Ulcer of the Lower Extremity Wound Location: Right,  Lateral Foot Wound Status: Open Wounding Event: Gradually Appeared Comorbid History: Hypertension, Type II Diabetes, Neuropathy Date Acquired: 03/12/2019 Weeks Of Treatment: 6 Clustered Wound: No Pending Amputation On Presentation Photos Wound Measurements Length: (cm) 0.4 % Reduc Width:  (cm) 0.8 % Reduc Depth: (cm) 0.6 Epithel Area: (cm) 0.251 Tunnel Volume: (cm) 0.151 Underm Star Endi Maxi tion in Area: 60.5% tion in Volume: -18.9% ialization: None ing: No ining: Yes ting Position (o'clock): 12 ng Position (o'clock): 6 mum Distance: (cm) 0.6 Wound Description Classification: Grade 1 Foul Od Wound Margin: Flat and Intact Slough/ Exudate Amount: Medium Exudate Type: Serous Exudate Color: amber or After Cleansing: No Fibrino Yes Wound Bed Granulation Amount: Large (67-100%) Exposed Structure Granulation Quality: Pink Fascia Exposed: No Necrotic Amount: Small (1-33%) Fat Layer (Subcutaneous Tissue) Exposed: Yes Necrotic Quality: Adherent Slough Tendon Exposed: No Muscle Exposed: No Joint Exposed: No Bone Exposed: No Treatment Notes Wound #4 (Right, Lateral Foot) COLLIE, BAMBRICK ([id number]) Notes Santyl, scell, gauze, conform Electronic Signature(s) Signed: 04/24/2019 10:18:26 AM By: [name] Signed: 04/24/2019 4:06:41 PM By: Montey Hora Entered By: [name] on 04/24/2019 08:22:08 [name] ([id number]) -------------------------------------------------------------------------------- Vitals Details Patient Name: [name] Date of Service: 04/24/2019 8:00 AM Medical Record Number: [id number] Patient Account Number: [id number] Date of Birth/Sex: 1965-08-08 (54 y.o. M) Treating RN: Montey Hora Primary Care Sala Tague: [name] Other Clinician: Referring Marcea Rojek: [name] Treating Jru Pense/Extender: Tito Dine in Treatment: 6 Vital Signs Time Taken: 08:12 Temperature (F): 98.3 Height (in): 74 Pulse (bpm): 81 Weight (lbs): 308 Respiratory Rate (breaths/min): 16 Body Mass Index (BMI): 39.5 Blood Pressure (mmHg): 145/61 Reference Range: 80 - 120 mg / dl Electronic Signature(s) Signed: 04/24/2019 4:06:41 PM By: Montey Hora Entered By: Montey Hora on 04/24/2019 08:14:02

## 2019-04-24 NOTE — Telephone Encounter (Signed)
Error

## 2019-04-24 NOTE — Progress Notes (Signed)
MARSHAWN, CLINE ([id number]) Visit Report for 04/24/2019 HPI Details Patient Name: [name], [name]. Date of Service: 04/24/2019 8:00 AM Medical Record Number: [id number] Patient Account Number: [id number] Date of Birth/Sex: [birth year] (54 y.o. M) Treating RN: [name] Primary Care Provider: [name] Other Clinician: Referring Provider: [name] Treating Provider/Extender: Tito Dine in Treatment: 6 History of Present Illness HPI Description: This 54 year old male comes with an ulcerated area on the plantar aspect of the right foot which she's had for approximately a month. I have known him from a previous visit at [facility] wound center and was treated in the months of April and May 2016 and rapidly healed a left plantar ulcer with a total contact cast. He has been a diabetic for about 16 years and tries to keep active and is fairly compliant with his diabetes management. 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 Past medical history significant for hypertension, hyperlipidemia, and status post appendectomy 1993. He does not smoke or drink alcohol. 01/14/2015 -- the patient had tolerated his total contact cast very well and had no problems and has had no systemic symptoms. However when his total contact cast was cut open he had excessive amount of purulent drainage in spite of being on antibiotics. He had had a recent x-ray done in the ER 12/22/2014 which showed IMPRESSION:No evidence of osseous erosion. Known soft tissue ulceration is not well characterized on radiograph. Scattered vascular calcifications seen. his last hemoglobin A1c in December was 7.3. He has been on Augmentin and doxycycline for the last 2 weeks. 01/21/2015 -- his culture grew rare growth of Pantoea species an MR moderate growth of Candida parapsilosis. it is sensitive to levofloxacin. He has not heard back from the insurance company regarding his hyperbaric oxygen  therapy. His MRI has not been done yet and we will try and get him an earlier date 01/28/2015 -- MRI was done last night -- IMPRESSION:1. Soft tissue ulcer overlying the plantar aspect of the fifth metatarsal head extending to the cortex. Subcortical marrow edema in the fifth metatarsal head with corresponding T1 hypointensity is concerning for early osteomyelitis of the plantar lateral aspect of the fifth metatarsal head. Chest x-ray done on 01/14/2015 shows bronchiectatic changes without infiltrate. EKG done on generally 17 2017 shows a normal sinus rhythm and is a normal EKG. 02/04/2015 -- he was asked to see Dr. [name] last week and had 2 appointments but had to cancel both due to pressures of work. Last night he has woken up with severe pain in the foot and leg and it is swollen up. No fever or no change in his blood glucose. Addendum: I spoke with Dr. [name] who kindly agreed to accept the patient for inpatient therapy and have also opened to the hospitalist Dr. [name], and discuss details of the management including PICC line and repeat cultures. 02/12/2015-- -- was seen by Dr. [name] in the hospital and a PICC line was placed. He was to receive Ceftazidime 2 g every 12 hourly, oral levofloxacin 750 mg every 24 hourly and oral fluconazole 200 mg daily. The antibiotics were to be given for 4 weeks except the Diflucan was to be given for the first 2 weeks. Reviewed note from 02/10/2015 -- and Dr. [name] had recommended management for growth of MSSA and Serratia. He switched him from ceftazidime to ceftriaxone 2 g every 24 hours. Levofloxacin was stopped and he would continue on fluconazole for another week. He had asked me to decide whether further imaging  was necessary and whether surgical debridement of the infected bone was needed. He is doing well and has been off work for this week and we will keep him off the next week. 02/22/2015 -- he was seen by my colleague on 01/19/2015  and at that time an incision and drainage was done on his right lateral forefoot on the dorsum. Today when I probed this wound it is frankly draining pus and it communicates with the ulcer on the plantar aspect of his right foot. The patient is still on IV ceftriaxone 2 g every 24 hours and is to be seen by Dr. [name] on Friday. 03/19/2015 -- On 03/04/2015 I spoke to Dr. 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 He recommended operative debridement which would probably result in the fifth metatarsal head and toe amputation.The patient would be referred back to [place] once he was done with surgery. He was admitted to [facility] yesterday and had surgery done by podiatry for a right fifth metatarsal acute osteomyelitis with cellulitis and abscess. He had a right foot incision and drainage with fifth metatarsal partial amputation and removal of toe infected bone and soft tissue with cultures. The wound was partially closed and packing of the distal end was done. Patient was already on cefepime 2 g IV every 8 hourly and put on vancomycin pending final cultures. He had grown moderate gram-negative rods, and later found to be rare diphtheroids. I received a call from Dr. Cannon Kettle the podiatrist and we discussed the above. On 03/10/2015 he was found positive for influenza a and has been put on Tamiflu. Since his discharge he has been seen by Dr. Cannon Kettle who is planning to remove his sutures this coming week. He was reviewed by Dr. [name] on 03/17/2015 and his Diflucan was stopped and vancomycin. After the 3 doses he is taking. He is going to change Ceftazidime to Zosyn 3.375 g IV every 8 hours. NEVAN, TEWALT ([id number]) 03/25/2015 -- he was seen by the podiatrist a couple of days ago and the sutures were removed. She will follow  back with him in 4 weeks' time and at that time x-rays will be taken and a custom molded insert would be made for his shoe. 04/01/2015 -- he was seen by Dr. [name] on 03/29/2015 who pulled the PICC line stopped his IV antibiotics and recommended starting doxycycline and levofloxacin for 2 weeks. He also stop the fluconazole. The patient will follow up with him only when necessary. Readmission: 03/15/17 on evaluation today patient presents for initial evaluation concerning the new issues although he has previously been evaluated in our clinic. Unfortunately the previous evaluation led to the patient having to proceed to amputation and so he is somewhat nervous about being here today. With that being said he has a very slight blister that occurred on the plantar aspect more medial on the right great toe that has been present for just a very short amount of time, several days. With that being said he felt like initially when he called that he somewhat overreacted but due to the fact that his previous issue led to amputation he is very cautious these days I explained that he did the right thing. With that being said he has been tolerating the dressing changes without complication mainly he just been covering  this. He does not have any discomfort in secondary to neuropathy it's unlikely that he feels much. With that being said he does tell me that the issue that he had here is that he went barefoot when he knows he should not have which subsequently led to the blisters. He states is definitely not doing that anymore. His most recent hemoglobin A1c was December and registered 6.9 his ABI today was 1.1. 03/27/17 on evaluation today patient appears to be doing great in regard to his right great toe ulcer. He has been tolerating the dressing changes without complication. The good news is this is making excellent progress and he seems to be caring for this in an excellent fashion. I see no evidence of  breakdown that would make me concerned that he was at risk for infection/amputation. This has obviously been his concern due to the fifth toe amputation that was necessitated previous when he had a similar issue. Nonetheless this seems to be progressing much more nicely. Readmission: 03/13/2019 upon evaluation today patient presents for reevaluation here in the clinic concerning issues he has been having with his right foot on the lateral portion at the proximal end of the metatarsal. Subsequently he tells me that this just opened up in the past couple of days or so and the erythema really began about 24 to 48 hours ago. Fortunately there is no signs of systemic infection. 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 That left him with this bony prominence that is where the wound is currently. There is erythema surrounding that does have me concerned about cellulitis. With that being said he was noncompressible as far as ABIs are concerned I do think we can need to check into arterial studies at this point. Patient does have a history of hypertension along with diabetes mellitus type 2. He also tends to develop quite a bit of callus white often. 03/20/2019 upon evaluation today patient appears to be doing very well in regard to his wounds currently. He has been tolerating the dressing changes without complication. Fortunately there is no signs of infection. His ABIs were good at this point and registered at 1.07 on the left and 1.09 on the right. In regard to the x-ray this was negative as well for any signs of osteomyelitis. The patient also seems to be doing better in regard to the infection. He still has several days of the antibiotic left at this point best the Bactrim DS and he seems to be doing very well with this. Overall I am extremely pleased with the progress in 1 week's time he is going require some sharp debridement today however. 03/27/2019 upon  evaluation today patient actually appears to be making some progress in regard to his wound. It is a little deeper but measuring smaller which is good news due to the fact that again were clear away some of the necrotic tissue which is why the depth is increasing a little bit. Nonetheless after like were getting very close to being down at a good wound bed. Fortunately there is no signs of active infection at this time. There is a little bit of erythema immediately surrounding the wound that we do want to be very cognizant of and careful about. For that reason I am going to go ahead and extend his antibiotic today for an additional 10 days that is the Bactrim. 04/03/2019 upon evaluation today patient appears to be making some progress. I do  feel like the Annitta Needs is doing a good job along with the alginate is being packed into the wound space behind. He has been tolerating the dressing changes without complication. Fortunately there is no signs of active infection at this time. No fevers, chills, nausea, vomiting, or diarrhea. 04/10/2019 upon evaluation today patient appears to be making progress. The wound is not quite as deep but he still does not have a great wound surface yet for working on this and the Santyl does seem to be cleaning things up. Fortunately there is no evidence of active infection at this time. No fevers, chills, nausea, vomiting, or diarrhea. 04/17/2019 upon evaluation today patient appears to be doing excellent at this time in regard to his foot ulcer. I do believe the Santyl with the alginate packed in behind has been of benefit for him and I am very pleased in this regard. With that being said I am feeling like the wound surface is improving quite a bit each time I see him still I do not believe we are at the point of stating that the wound bed is perfect but again were making progress. The patient is going require some sharp debridement today. 4/22; this is a patient who is using  Santyl with calcium alginate. Apparently his wound dimensions have been improving. Patient is changing the dressing himself. He has a surgical shoe. He works in a sitting position therefore is not on his feet all that much. There is undermining Electronic Signature(s) Signed: 04/24/2019 4:21:14 PM By: Linton Ham MD Entered By: Linton Ham on 04/24/2019 08:32:15 [name] ([id number]) -------------------------------------------------------------------------------- Physical Exam Details Patient Name: JAHAAN, PAVLOCK Date of Service: 04/24/2019 8:00 AM Medical Record Number: [id number] Patient Account Number: [id number] Date of Birth/Sex: Oct 17, 1965 (54 y.o. M) Treating RN: [name] Primary Care Provider: [name] Other Clinician: Referring Provider: [name] Treating Provider/Extender: Tito Dine in Treatment: 6 Constitutional Patient is hypertensive.. Pulse regular and within target range for patient.Marland Kitchen Respirations regular, non-labored and within target range.. Temperature is normal and within the target range for the patient.Marland Kitchen appears in no distress. Eyes Conjunctivae clear. No discharge. Cardiovascular Pedal pulses easily palpable.. Integumentary (Hair, Skin) There is no erythema around the wound. Psychiatric No evidence of depression, anxiety, or agitation. Calm, cooperative, and communicative. Appropriate interactions and affect.. Notes Wound exam; small wound in terms of orifice. I gather the depth has been improving. What I can see of the surface of the wounds looks fairly decent. There is undermining over the majority of the wound circumference maximal 0.5 cm. No evidence of surrounding infection. The tissue around the wound looks as though it is a weightbearing surface Electronic Signature(s) Signed: 04/24/2019 4:21:14 PM By: Linton Ham MD Entered By: Linton Ham on 04/24/2019 08:36:11 [name]  ([id number]) -------------------------------------------------------------------------------- Physician Orders Details Patient Name: [name] Date of Service: 04/24/2019 8:00 AM Medical Record Number: [id number] Patient Account Number: [id number] Date of Birth/Sex: 03/19/1965 (54 y.o. M) Treating RN: [name] Primary Care Provider: [name] Other Clinician: Referring Provider: [name] Treating Provider/Extender: Tito Dine in Treatment: 6 Verbal / Phone Orders: No Diagnosis Coding Wound Cleansing Wound #4 Right,Lateral Foot o Clean wound with Normal Saline. - in office o Dial antibacterial soap, wash wounds, rinse and pat dry prior to dressing wounds Primary Wound Dressing Wound #4 Right,Lateral Foot o Santyl Ointment o Silver Alginate - on top of santyl Secondary Dressing Wound #4 Right,Lateral Foot o Gauze and Kerlix/Conform Dressing  Change Frequency Wound #4 Right,Lateral Foot o Change dressing every day. Edema Control Wound #4 Right,Lateral Foot o Patient to wear own compression stockings - wear daily Off-Loading Wound #4 Right,Lateral Foot o Open toe surgical shoe with peg assist. Electronic Signature(s) Signed: 04/24/2019 10:18:26 AM By: [name] Signed: 04/24/2019 4:21:14 PM By: Linton Ham MD Entered By: [name] on 04/24/2019 08:23:19 [name] ([id number]) -------------------------------------------------------------------------------- Problem List Details Patient Name: MARCKUS, STEFANOVICH. Date of Service: 04/24/2019 8:00 AM Medical Record Number: [id number] Patient Account Number: [id number] Date of Birth/Sex: [birth year] (54 y.o. 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 M) Treating RN: [name] Primary Care Provider: [name] Other Clinician: Referring Provider: [name] Treating Provider/Extender: Tito Dine in Treatment: 6 Subjective History of Present Illness (HPI) This 54 year old male comes with an ulcerated area on the plantar aspect of the right foot which she's had for approximately a month. I have known him from a previous visit at [facility] wound center and was treated in the months of April and May 2016 and rapidly healed a left plantar ulcer with a total contact cast. He has been a diabetic for about 16 years and tries to keep active and is fairly compliant with his diabetes management. 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 Past medical history significant for hypertension, hyperlipidemia, and status post appendectomy 1993. He does not smoke or drink alcohol. 01/14/2015 -- the patient had tolerated his total contact cast very well and had no problems and has had no systemic symptoms. However when his total contact cast was cut open he had excessive amount of purulent drainage in  spite of being on antibiotics. He had had a recent x-ray done in the ER 12/22/2014 which showed IMPRESSION:No evidence of osseous erosion. Known soft tissue ulceration is not well characterized on radiograph. Scattered vascular calcifications seen. his last hemoglobin A1c in December was 7.3. He has been on Augmentin and doxycycline for the last 2 weeks. 01/21/2015 -- his culture grew rare growth of Pantoea species an MR moderate growth of Candida parapsilosis. it is sensitive to levofloxacin. He has not heard back from the insurance company regarding his hyperbaric oxygen therapy. His MRI has not been done yet and we will try and get him an earlier date 01/28/2015 -- MRI was done last night -- IMPRESSION:1. Soft tissue ulcer overlying the plantar aspect of the fifth metatarsal head extending to the cortex. Subcortical marrow edema in the fifth metatarsal head with corresponding T1 hypointensity is concerning for early osteomyelitis of the plantar lateral aspect of the fifth metatarsal head. Chest x-ray done on 01/14/2015 shows bronchiectatic changes without infiltrate. EKG done on generally 17 2017 shows a normal sinus rhythm  and is a normal EKG. 02/04/2015 -- he was asked to see Dr. [name] last week and had 2 appointments but had to cancel both due to pressures of work. Last night he has woken up with severe pain in the foot and leg and it is swollen up. No fever or no change in his blood glucose. Addendum: I spoke with Dr. [name] who kindly agreed to accept the patient for inpatient therapy and have also opened to the hospitalist Dr. [name], and discuss details of the management including PICC line and repeat cultures. 02/12/2015-- -- was seen by Dr. [name] in the hospital and a PICC line was placed. He was to receive Ceftazidime 2 g every 12 hourly, oral levofloxacin 750 mg every 24 hourly and oral fluconazole 200 mg daily. The antibiotics were to be given for 4 weeks except the  Diflucan was to be given for the first 2 weeks. Reviewed note from 02/10/2015 -- and Dr. [name] had recommended management for growth of MSSA and Serratia. He switched him from ceftazidime to ceftriaxone 2 g every 24 hours. Levofloxacin was stopped and he would continue on fluconazole for another week. He had asked me to decide whether further imaging was necessary and whether surgical debridement of the infected bone was needed. He is doing well and has been off work for this week and we will keep him off the next week. 02/22/2015 -- he was seen by my colleague on 01/19/2015 and at that time an incision and drainage was done on his right lateral forefoot on the dorsum. Today when I probed this wound it is frankly draining pus and it communicates with the ulcer on the plantar aspect of his right foot. The patient is still on IV ceftriaxone 2 g every 24 hours and is to be seen by Dr. [name] on Friday. 03/19/2015 -- On 03/04/2015 I spoke to Dr. 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 He recommended operative debridement which would probably result in the fifth metatarsal head and toe amputation.The patient would be referred back to [place] once he was done with surgery. He was admitted to [facility] yesterday and had surgery done by podiatry for a right fifth metatarsal acute osteomyelitis with cellulitis and abscess. He had a right foot incision and drainage with fifth metatarsal partial amputation and removal of toe infected bone and soft tissue with cultures. The wound was partially closed and packing of the distal end was done. Patient was already on cefepime 2 g IV every 8 hourly and put on vancomycin pending final cultures. He had grown moderate gram-negative rods, and later found to be rare diphtheroids. I received a call  from Dr. Cannon Kettle the podiatrist and we discussed the above. On 03/10/2015 he was found positive for influenza a and has been put on Tamiflu. Since his discharge he has been seen by Dr. Cannon Kettle who is planning to remove his sutures this coming week. He was reviewed by Dr. [name] on 03/17/2015 and his Diflucan was stopped and vancomycin. After the 3 doses he is taking. He is going to change Ceftazidime to Zosyn 3.375 g IV every 8 hours. 03/25/2015 -- he was seen by the podiatrist a couple of days ago and the sutures were removed. She will follow back with him in 4 weeks' time and at that  time x-rays will be taken and a custom molded insert would be made for his shoe. 04/01/2015 -- he was seen by Dr. [name] on 03/29/2015 who pulled the PICC line stopped his IV antibiotics and recommended starting doxycycline and levofloxacin for 2 weeks. He also stop the fluconazole. The patient will follow up with him only when necessary. FAMOUS, SPELLER ([id number]) Readmission: 03/15/17 on evaluation today patient presents for initial evaluation concerning the new issues although he has previously been evaluated in our clinic. Unfortunately the previous evaluation led to the patient having to proceed to amputation and so he is somewhat nervous about being here today. With that being said he has a very slight blister that occurred on the plantar aspect more medial on the right great toe that has been present for just a very short amount of time, several days. With that being said he felt like initially when he called that he somewhat overreacted but due to the fact that his previous issue led to amputation he is very cautious these days I explained that he did the right thing. With that being said he has been tolerating the dressing changes without complication mainly he just been covering this. He does not have any discomfort in secondary to neuropathy it's unlikely that he feels much. With that being said  he does tell me that the issue that he had here is that he went barefoot when he knows he should not have which subsequently led to the blisters. He states is definitely not doing that anymore. His most recent hemoglobin A1c was December and registered 6.9 his ABI today was 1.1. 03/27/17 on evaluation today patient appears to be doing great in regard to his right great toe ulcer. He has been tolerating the dressing changes without complication. The good news is this is making excellent progress and he seems to be caring for this in an excellent fashion. I see no evidence of breakdown that would make me concerned that he was at risk for infection/amputation. This has obviously been his concern due to the fifth toe amputation that was necessitated previous when he had a similar issue. Nonetheless this seems to be progressing much more nicely. Readmission: 03/13/2019 upon evaluation today patient presents for reevaluation here in the clinic concerning issues he has been having with his right foot on the lateral portion at the proximal end of the metatarsal. Subsequently he tells me that this just opened up in the past couple of days or so and the erythema really began about 24 to 48 hours ago. Fortunately there is no signs of systemic infection. 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 That left him with this bony prominence that is where the wound is currently. There is erythema surrounding that does have me concerned about cellulitis. With that being said he was noncompressible as far as ABIs are concerned I do think we can need to check into arterial studies at this point. Patient does have a history of hypertension along with diabetes mellitus type 2. He also tends to develop quite a bit of callus white often. 03/20/2019 upon evaluation today patient appears to be doing very well in regard to his wounds currently. He has been tolerating the dressing  changes without complication. Fortunately there is no signs of infection. His ABIs were good at this point and registered at 1.07 on the left and 1.09 on the right. In regard to the x-ray this was negative  as well for any signs of osteomyelitis. The patient also seems to be doing better in regard to the infection. He still has several days of the antibiotic left at this point best the Bactrim DS and he seems to be doing very well with this. Overall I am extremely pleased with the progress in 1 week's time he is going require some sharp debridement today however. 03/27/2019 upon evaluation today patient actually appears to be making some progress in regard to his wound. It is a little deeper but measuring smaller which is good news due to the fact that again were clear away some of the necrotic tissue which is why the depth is increasing a little bit. Nonetheless after like were getting very close to being down at a good wound bed. Fortunately there is no signs of active infection at this time. There is a little bit of erythema immediately surrounding the wound that we do want to be very cognizant of and careful about. For that reason I am going to go ahead and extend his antibiotic today for an additional 10 days that is the Bactrim. 04/03/2019 upon evaluation today patient appears to be making some progress. I do feel like the Annitta Needs is doing a good job along with the alginate is being packed into the wound space behind. He has been tolerating the dressing changes without complication. Fortunately there is no signs of active infection at this time. No fevers, chills, nausea, vomiting, or diarrhea. 04/10/2019 upon evaluation today patient appears to be making progress. The wound is not quite as deep but he still does not have a great wound surface yet for working on this and the Santyl does seem to be cleaning things up. Fortunately there is no evidence of active infection at this time. No fevers, chills,  nausea, vomiting, or diarrhea. 04/17/2019 upon evaluation today patient appears to be doing excellent at this time in regard to his foot ulcer. I do believe the Santyl with the alginate packed in behind has been of benefit for him and I am very pleased in this regard. With that being said I am feeling like the wound surface is improving quite a bit each time I see him still I do not believe we are at the point of stating that the wound bed is perfect but again were making progress. The patient is going require some sharp debridement today. 4/22; this is a patient who is using Santyl with calcium alginate. Apparently his wound dimensions have been improving. Patient is changing the dressing himself. He has a surgical shoe. He works in a sitting position therefore is not on his feet all that much. There is undermining Objective Constitutional Patient is hypertensive.. Pulse regular and within target range for patient.Marland Kitchen Respirations regular, non-labored and within target range.. Temperature is normal and within the target range for the patient.Marland Kitchen appears in no distress. Vitals Time Taken: 8:12 AM, Height: 74 in, Weight: 308 lbs, BMI: 39.5, Temperature: 98.3 F, Pulse: 81 bpm, Respiratory Rate: 16 breaths/min, Blood Crisostomo, Johathan J. ([id number]) Pressure: 145/61 mmHg. Eyes Conjunctivae clear. No discharge. Cardiovascular Pedal pulses easily palpable.Marland Kitchen Psychiatric No evidence of depression, anxiety, or agitation. Calm, cooperative, and communicative. Appropriate interactions and affect.. General Notes: Wound exam; small wound in terms of orifice. I gather the depth has been improving. What I can see of the surface of the wounds looks fairly decent. There is undermining over the majority of the wound circumference maximal 0.5 cm. No evidence of surrounding infection. The  tissue around the wound looks as though it is a weightbearing surface Integumentary (Hair, Skin) There is no erythema around  the wound. Wound #4 status is Open. Original cause of wound was Gradually Appeared. The wound is located on the Right,Lateral Foot. The wound measures 0.4cm length x 0.8cm width x 0.6cm depth; 0.251cm^2 area and 0.151cm^3 volume. There is Fat Layer (Subcutaneous Tissue) Exposed exposed. There is no tunneling noted, however, there is undermining starting at 12:00 and ending at 6:00 with a maximum distance of 0.6cm. There is a medium amount of serous drainage noted. The wound margin is flat and intact. There is large (67-100%) pink granulation within the wound bed. There is a small (1-33%) amount of necrotic tissue within the wound bed including Adherent Slough. Assessment Active Problems ICD-10 Type 2 diabetes mellitus with foot ulcer 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d Corns and callosities Essential (primary) hypertension Acquired absence of other left toe(s) Plan Wound Cleansing: Wound #4 Right,Lateral Foot: Clean wound with Normal Saline. - in office Dial antibacterial soap, wash wounds, rinse and pat dry prior to dressing wounds Primary Wound Dressing: Wound #4 Right,Lateral Foot: Santyl Ointment Silver Alginate - on top of santyl Secondary Dressing: Wound #4 Right,Lateral Foot: Gauze and Kerlix/Conform Dressing Change Frequency: Wound #4 Right,Lateral Foot: Change dressing every day. Edema Control: Wound #4 Right,Lateral Foot: Patient to wear own compression stockings - wear daily Off-Loading: Wound #4 Right,Lateral Foot: Open toe surgical shoe with peg assist. TAREEK, THELEN. ([id number]) 1. No change in the wound dressing which was Santyl ointment is and calcium alginate 2. Apparently the wound volume is improving with this 3. If this stalls will need to consider changing the dressing perhaps endoform and consideration of the total contact cast. Although this is not technically on the plantar part of his foot this is a  weightbearing surface in this patient. I have asked him to try and shift his weight even in the surgical shoe towards the medial part of his foot 4. No evidence of infection Electronic Signature(s) Signed: 04/24/2019 4:21:14 PM By: Linton Ham MD Entered By: Linton Ham on 04/24/2019 08:37:24 [name] ([id number]) -------------------------------------------------------------------------------- SuperBill Details Patient Name: [name] Date of Service: 04/24/2019 Medical Record Number: [id number] Patient Account Number: [id number] Date of Birth/Sex: 1965/07/04 (54 y.o. M) Treating RN: [name] Primary Care Provider: [name] Other Clinician: Referring Provider: [name] Treating Provider/Extender: Tito Dine in Treatment: 6 Diagnosis Coding ICD-10 Codes Code Description E11.621 Type 2 diabetes mellitus with foot ulcer L97.512 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d L84 Corns and callosities I10 Essential (primary) hypertension Z89.422 Acquired absence of other left toe(s) Facility Procedures CPT4 Code: [id number] Description: 99213 - WOUND CARE VISIT-LEV 3 EST PT Modifier: Quantity: 1 Physician Procedures CPT4 Code: [id number] Description: [id number] - WC PHYS LEVEL 3 - EST PT Modifier: Quantity: 1 CPT4 Code: Description: ICD-10 Diagnosis Description E11.621 Type 2 diabetes mellitus with foot ulcer L97.512 Non-pressure chronic ulcer of other part of right foot with fat layer expos Modifier: ed Quantity: Electronic Signature(s) Signed: 04/24/2019 4:21:14 PM By: Linton Ham MD Entered By: Linton Ham on 04/24/2019 08:37:47

## 2019-05-01 ENCOUNTER — Other Ambulatory Visit: Payer: Self-pay

## 2019-05-01 ENCOUNTER — Encounter: Payer: Managed Care, Other (non HMO) | Admitting: Physician Assistant

## 2019-05-01 DIAGNOSIS — E11621 Type 2 diabetes mellitus with foot ulcer: Secondary | ICD-10-CM | POA: Diagnosis not present

## 2019-05-02 NOTE — Progress Notes (Signed)
KHARTER, REXROTH ([id number]) Visit Report for 05/01/2019 Arrival Information Details Patient Name: [name], [name]. Date of Service: 05/01/2019 8:00 AM Medical Record Number: [id number] Patient Account Number: [id number] Date of Birth/Sex: 1965/11/05 (54 y.o. M) Treating RN: Montey Hora Primary Care Starlit Raburn: [name] Other Clinician: Referring Princeston Blizzard: [name] Treating Ruthmary Occhipinti/Extender: Melburn Hake, HOYT Weeks in Treatment: 7 Visit Information History Since Last Visit Added or deleted any medications: No Patient Arrived: Ambulatory Any new allergies or adverse reactions: No Arrival Time: 08:13 Had a fall or experienced change in No Accompanied By: self activities of daily living that may affect Transfer Assistance: None risk of falls: Patient Identification Verified: Yes Signs or symptoms of abuse/neglect since last visito No Secondary Verification Process Completed: Yes Hospitalized since last visit: No Patient Has Alerts: Yes Implantable device outside of the clinic excluding No Patient Alerts: DMII cellular tissue based products placed in the center since last visit: Has Dressing in Place as Prescribed: Yes Has Footwear/Offloading in Place as Prescribed: Yes Right: Surgical Shoe with Pressure Relief Insole Pain Present Now: No Electronic Signature(s) Signed: 05/01/2019 4:42:54 PM By: Montey Hora Entered By: Montey Hora on 05/01/2019 08:13:39 [name] ([id number]) -------------------------------------------------------------------------------- Clinic Level of Care Assessment Details Patient Name: [name] Date of Service: 05/01/2019 8:00 AM Medical Record Number: [id number] Patient Account Number: [id number] Date of Birth/Sex: [birth year] (54 y.o. M) Treating RN: [name] Primary Care Keiden Deskin: [name] Other Clinician: Referring Barth Trella: [name] Treating Kiley Solimine/Extender: Melburn Hake,  HOYT Weeks in Treatment: 7 Clinic Level of Care Assessment Items TOOL 4 Quantity Score []  - Use when only an EandM is performed on FOLLOW-UP visit 0 ASSESSMENTS - Nursing Assessment / Reassessment X - Reassessment of Co-morbidities (includes updates in patient status) 1 10 X- 1 5 Reassessment of Adherence to Treatment Plan ASSESSMENTS - Wound and Skin Assessment / Reassessment X - Simple Wound Assessment / Reassessment - one wound 1 5 []  - 0 Complex Wound Assessment / Reassessment - multiple wounds []  - 0 Dermatologic / Skin Assessment (not related to wound area) ASSESSMENTS - Focused Assessment []  - Circumferential Edema Measurements - multi extremities 0 []  - 0 Nutritional Assessment / Counseling / Intervention []  - 0 Lower Extremity Assessment (monofilament, tuning fork, pulses) []  - 0 Peripheral Arterial Disease Assessment (using hand held doppler) ASSESSMENTS - Ostomy and/or Continence Assessment and Care []  - Incontinence Assessment and Management 0 []  - 0 Ostomy Care Assessment and Management (repouching, etc.) PROCESS - Coordination of Care X - Simple Patient / Family Education for ongoing care 1 15 []  - 0 Complex (extensive) Patient / Family Education for ongoing care []  - 0 Staff obtains Programmer, systems, Records, Test Results / Process Orders []  - 0 Staff telephones HHA, Nursing Homes / Clarify orders / etc []  - 0 Routine Transfer to another Facility (non-emergent condition) []  - 0 Routine Hospital Admission (non-emergent condition) []  - 0 New Admissions / Biomedical engineer / Ordering NPWT, Apligraf, etc. []  - 0 Emergency Hospital Admission (emergent condition) X- 1 10 Simple Discharge Coordination []  - 0 Complex (extensive) Discharge Coordination PROCESS - Special Needs []  - Pediatric / Minor Patient Management 0 []  - 0 Isolation Patient Management []  - 0 Hearing / Language / Visual special needs []  - 0 Assessment of Community assistance  (transportation, D/C planning, etc.) []  - 0 Additional assistance / Altered mentation []  - 0 Support Surface(s) Assessment (bed, cushion, seat, etc.) INTERVENTIONS - Wound Cleansing / Measurement CRANSTON, ATILES. ([id number]) X- 1 5  Simple Wound Cleansing - one wound []  - 0 Complex Wound Cleansing - multiple wounds X- 1 5 Wound Imaging (photographs - any number of wounds) []  - 0 Wound Tracing (instead of photographs) X- 1 5 Simple Wound Measurement - one wound []  - 0 Complex Wound Measurement - multiple wounds INTERVENTIONS - Wound Dressings []  - Small Wound Dressing one or multiple wounds 0 X- 1 15 Medium Wound Dressing one or multiple wounds []  - 0 Large Wound Dressing one or multiple wounds []  - 0 Application of Medications - topical []  - 0 Application of Medications - injection INTERVENTIONS - Miscellaneous []  - External ear exam 0 []  - 0 Specimen Collection (cultures, biopsies, blood, body fluids, etc.) []  - 0 Specimen(s) / Culture(s) sent or taken to Lab for analysis []  - 0 Patient Transfer (multiple staff / Civil Service fast streamer / Similar devices) []  - 0 Simple Staple / Suture removal (25 or less) []  - 0 Complex Staple / Suture removal (26 or more) []  - 0 Hypo / Hyperglycemic Management (close monitor of Blood Glucose) []  - 0 Ankle / Brachial Index (ABI) - do not check if billed separately X- 1 5 Vital Signs Has the patient been seen at the hospital within the last three years: Yes Total Score: 80 Level Of Care: New/Established - Level 3 Electronic Signature(s) Signed: 05/01/2019 12:47:05 PM By: [name] Entered By: [name] on 05/01/2019 08:29:42 [name] ([id number]) -------------------------------------------------------------------------------- Encounter Discharge Information Details Patient Name: [name] Date of Service: 05/01/2019 8:00 AM Medical Record Number: [id number] Patient Account Number: [id number] Date of  Birth/Sex: [birth year] (54 y.o. M) Treating RN: [name] Primary Care Chayse Gracey: [name] Other Clinician: Referring Aayush Gelpi: [name] Treating Lindsey Demonte/Extender: Melburn Hake, HOYT Weeks in Treatment: 7 Encounter Discharge Information Items Discharge Condition: Stable Ambulatory Status: Ambulatory Discharge Destination: Home Transportation: Private Auto Accompanied By: self Schedule Follow-up Appointment: Yes Clinical Summary of Care: Electronic Signature(s) Signed: 05/01/2019 12:47:05 PM By: [name] Entered By: [name] on 05/01/2019 08:30:28 [name] ([id number]) -------------------------------------------------------------------------------- Lower Extremity Assessment Details Patient Name: [name] Date of Service: 05/01/2019 8:00 AM Medical Record Number: [id number] Patient Account Number: [id number] Date of Birth/Sex: [birth year] (54 y.o. M) Treating RN: Montey Hora Primary Care Javari Bufkin: [name] Other Clinician: Referring Meaghann Choo: [name] Treating Danaisha Celli/Extender: STONE III, HOYT Weeks in Treatment: 7 Edema Assessment Assessed: [Left: No] [Right: No] Edema: [Left: N] [Right: o] Vascular Assessment Pulses: Dorsalis Pedis Palpable: [Right:Yes] Electronic Signature(s) Signed: 05/01/2019 4:42:54 PM By: Montey Hora Entered By: Montey Hora on 05/01/2019 08:19:01 [name] ([id number]) -------------------------------------------------------------------------------- Multi Wound Chart Details Patient Name: [name] Date of Service: 05/01/2019 8:00 AM Medical Record Number: [id number] Patient Account Number: [id number] Date of Birth/Sex: 10-31-1965 (54 y.o. M) Treating RN: [name] Primary Care Kamaal Cast: [name] Other Clinician: Referring Edris Friedt: [name] Treating Catheline Hixon/Extender: Melburn Hake, HOYT Weeks in Treatment: 7 Vital Signs Height(in):  74 Pulse(bpm): 86 Weight(lbs): 308 Blood Pressure(mmHg): 157/70 Body Mass Index(BMI): 40 Temperature(F): 98.1 Respiratory Rate(breaths/min): 16 Photos: [N/A:N/A] Wound Location: Right, Lateral Foot N/A N/A Wounding Event: Gradually Appeared N/A N/A Primary Etiology: Diabetic Wound/Ulcer of the Lower N/A N/A Extremity Comorbid History: Hypertension, Type II Diabetes, N/A N/A Neuropathy Date Acquired: 03/12/2019 N/A N/A Weeks of Treatment: 7 N/A N/A Wound Status: Open N/A N/A Pending Amputation on Yes N/A N/A Presentation: Measurements L x W x D (cm) 0.3x0.6x0.6 N/A N/A Area (cm) : 0.141 N/A N/A Volume (cm) : 0.085 N/A N/A % Reduction  in Area: 77.80% N/A N/A % Reduction in Volume: 33.10% N/A N/A Classification: Grade 1 N/A N/A Exudate Amount: Medium N/A N/A Exudate Type: Serous N/A N/A Exudate Color: amber N/A N/A Wound Margin: Flat and Intact N/A N/A Granulation Amount: Large (67-100%) N/A N/A Granulation Quality: Pink N/A N/A Necrotic Amount: Small (1-33%) N/A N/A Exposed Structures: Fat Layer (Subcutaneous Tissue) N/A N/A Exposed: Yes Fascia: No Tendon: No Muscle: No Joint: No Bone: No Epithelialization: None N/A N/A Treatment Notes Electronic Signature(s) Signed: 05/01/2019 12:47:05 PM By: [name] Entered By: [name] on 05/01/2019 08:27:24 [name] ([name] ([id number]) -------------------------------------------------------------------------------- [place] Details Patient Name: ROSHOD, CAPO. Date of Service: 05/01/2019 8:00 AM Medical Record Number: [id number] Patient Account Number: [id number] Date of Birth/Sex: 03-30-1965 (54 y.o. M) Treating RN: [name] Primary Care Armond Cuthrell: [name] Other Clinician: Referring Javione Gunawan: [name] Treating Kassidy Frankson/Extender: Melburn Hake, HOYT Weeks in Treatment: 7 Active Inactive Nutrition Nursing Diagnoses: Impaired glucose  control: actual or potential Goals: Patient/caregiver verbalizes understanding of need to maintain therapeutic glucose control per primary care physician Date Initiated: 03/13/2019 Target Resolution Date: 04/11/2019 Goal Status: Active Interventions: Assess patient nutrition upon admission and as needed per policy Notes: Orientation to the Wound Care Program Nursing Diagnoses: Knowledge deficit related to the wound healing center program Goals: Patient/caregiver will verbalize understanding of the [place] Date Initiated: 03/13/2019 Target Resolution Date: 04/11/2019 Goal Status: Active Interventions: Provide education on orientation to the wound center Notes: Wound/Skin Impairment Nursing Diagnoses: Impaired tissue integrity Goals: Ulcer/skin breakdown will have a volume reduction of 30% by week 4 Date Initiated: 03/13/2019 Target Resolution Date: 04/11/2019 Goal Status: Active Interventions: Assess ulceration(s) every visit Notes: Electronic Signature(s) Signed: 05/01/2019 12:47:05 PM By: [name] Entered By: [name] on 05/01/2019 08:27:17 [name] ([id number]) -------------------------------------------------------------------------------- Pain Assessment Details Patient Name: [name] Date of Service: 05/01/2019 8:00 AM Medical Record Number: [id number] Patient Account Number: [id number] Date of Birth/Sex: 08-07-1965 (54 y.o. M) Treating RN: Montey Hora Primary Care Armel Rabbani: [name] Other Clinician: Referring Vamsi Apfel: [name] Treating Tresia Revolorio/Extender: Melburn Hake, HOYT Weeks in Treatment: 7 Active Problems Location of Pain Severity and Description of Pain Patient Has Paino No Site Locations Pain Management and Medication Current Pain Management: Electronic Signature(s) Signed: 05/01/2019 4:42:54 PM By: Montey Hora Entered By: Montey Hora on 05/01/2019 08:14:43 [name]  ([id number]) -------------------------------------------------------------------------------- Patient/Caregiver Education Details Patient Name: [name] Date of Service: 05/01/2019 8:00 AM Medical Record Number: [id number] Patient Account Number: [id number] Date of Birth/Gender: 1965-06-20 (54 y.o. M) Treating RN: [name] Primary Care Physician: [name] Other Clinician: Referring Physician: [name] Treating Physician/Extender: Sharalyn Ink in Treatment: 7 Education Assessment Education Provided To: Patient Education Topics Provided Wound/Skin Impairment: Handouts: Caring for Your Ulcer Methods: Demonstration, Explain/Verbal Responses: State content correctly Electronic Signature(s) Signed: 05/01/2019 12:47:05 PM By: [name] Entered By: [name] on 05/01/2019 08:29:55 [name] ([id number]) -------------------------------------------------------------------------------- Wound Assessment Details Patient Name: [name] Date of Service: 05/01/2019 8:00 AM Medical Record Number: [id number] Patient Account Number: [id number] Date of Birth/Sex: 10-Mar-1965 (54 y.o. M) Treating RN: Montey Hora Primary Care Lajuanda Penick: [name] Other Clinician: Referring Jessamyn Watterson: [name] Treating Devan Danzer/Extender: STONE III, HOYT Weeks in Treatment: 7 Wound Status Wound Number: 4 Primary Etiology: Diabetic Wound/Ulcer of the Lower Extremity Wound Location: Right, Lateral Foot Wound Status: Open Wounding Event: Gradually Appeared Comorbid History: Hypertension, Type II Diabetes, Neuropathy Date Acquired: 03/12/2019 Weeks Of  Treatment: 7 Clustered Wound: No Pending Amputation On Presentation Photos Wound Measurements Length: (cm) 0.3 % Redu Width: (cm) 0.6 % Redu Depth: (cm) 0.6 Epithe Area: (cm) 0.141 [place] Volume: (cm) 0.085 Under ction in Area: 77.8% ction in Volume: 33.1% lialization: None ling:  No mining: No Wound Description Classification: Grade 1 Foul O Wound Margin: Flat and Intact Slough Exudate Amount: Medium Exudate Type: Serous Exudate Color: amber dor After Cleansing: No /Fibrino Yes Wound Bed Granulation Amount: Large (67-100%) Exposed Structure Granulation Quality: Pink Fascia Exposed: No Necrotic Amount: Small (1-33%) Fat Layer (Subcutaneous Tissue) Exposed: Yes Necrotic Quality: Adherent Slough Tendon Exposed: No Muscle Exposed: No Joint Exposed: No Bone Exposed: No Treatment Notes Wound #4 (Right, Lateral Foot) Notes Endoform, gauze, conform AYVIN, HARGRAVES ([id number]) Electronic Signature(s) Signed: 05/01/2019 4:42:54 PM By: Montey Hora Entered By: Montey Hora on 05/01/2019 08:18:41 [name] ([id number]) -------------------------------------------------------------------------------- Vitals Details Patient Name: [name] Date of Service: 05/01/2019 8:00 AM Medical Record Number: [id number] Patient Account Number: [id number] Date of Birth/Sex: 11/02/1965 (54 y.o. M) Treating RN: Montey Hora Primary Care Vinnie Bobst: [name] Other Clinician: Referring Domonic Hiscox: [name] Treating Crecencio Kwiatek/Extender: Melburn Hake, HOYT Weeks in Treatment: 7 Vital Signs Time Taken: 08:14 Temperature (F): 98.1 Height (in): 74 Pulse (bpm): 86 Weight (lbs): 308 Respiratory Rate (breaths/min): 16 Body Mass Index (BMI): 39.5 Blood Pressure (mmHg): 157/70 Reference Range: 80 - 120 mg / dl Electronic Signature(s) Signed: 05/01/2019 4:42:54 PM By: Montey Hora Entered By: Montey Hora on 05/01/2019 08:14:37

## 2019-05-02 NOTE — Progress Notes (Signed)
ARMEN, LUST ([id number]) Visit Report for 05/01/2019 Chief Complaint Document Details Patient Name: [name], [name]. Date of Service: 05/01/2019 8:00 AM Medical Record Number: [id number] Patient Account Number: [id number] Date of Birth/Sex: [birth year] (54 y.o. M) Treating RN: [name] Primary Care Provider: [name] Other Clinician: Referring Provider: [name] Treating Provider/Extender: Melburn Hake, Jule Schlabach Weeks in Treatment: 7 Information Obtained from: Patient Chief Complaint Right lateral foot ulcer Electronic Signature(s) Signed: 05/01/2019 9:03:47 AM By: [name] PA-C Entered By: [name] on 05/01/2019 09:03:47 [name] ([id number]) -------------------------------------------------------------------------------- HPI Details Patient Name: [name] Date of Service: 05/01/2019 8:00 AM Medical Record Number: [id number] Patient Account Number: [id number] Date of Birth/Sex: 14-May-1965 (54 y.o. M) Treating RN: [name] Primary Care Provider: [name] Other Clinician: Referring Provider: [name] Treating Provider/Extender: STONE III, Tenise Stetler Weeks in Treatment: 7 History of Present Illness HPI Description: This 54 year old male comes with an ulcerated area on the plantar aspect of the right foot which she's had for approximately a month. I have known him from a previous visit at [facility] wound center and was treated in the months of April and May 2016 and rapidly healed a left plantar ulcer with a total contact cast. He has been a diabetic for about 16 years and tries to keep active and is fairly compliant with his diabetes management. 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 Past medical history significant for hypertension, hyperlipidemia, and status post appendectomy 1993. He does not smoke or drink alcohol. 01/14/2015 -- the patient had tolerated his total contact cast very well and had no  problems and has had no systemic symptoms. However when his total contact cast was cut open he had excessive amount of purulent drainage in spite of being on antibiotics. He had had a recent x-ray done in the ER 12/22/2014 which showed IMPRESSION:No evidence of osseous erosion. Known soft tissue ulceration is not well characterized on radiograph. Scattered vascular calcifications seen. his last hemoglobin A1c in December was 7.3. He has been on Augmentin and doxycycline for the last 2 weeks. 01/21/2015 -- his culture grew rare growth of Pantoea species an MR moderate growth of Candida parapsilosis. it is sensitive to levofloxacin. He has not heard back from the insurance company regarding his hyperbaric oxygen therapy. His MRI has not been done yet and we will try and get him an earlier date 01/28/2015 -- MRI was done last night -- IMPRESSION:1. Soft tissue ulcer overlying the plantar aspect of the fifth metatarsal head extending to the cortex. Subcortical marrow edema in the fifth metatarsal head with corresponding T1 hypointensity is concerning for early osteomyelitis of the plantar lateral aspect of the fifth metatarsal head. Chest x-ray done on 01/14/2015 shows bronchiectatic changes without infiltrate. EKG done on generally 17 2017 shows a normal sinus rhythm and is a normal EKG. 02/04/2015 -- he was asked to see Dr. [name] last week and had 2 appointments but had to cancel both due to pressures of work. Last night he has woken up with severe pain in the foot and leg and it is swollen up. No fever or no change in his blood glucose. Addendum: I spoke with Dr. [name] who kindly agreed to accept the patient for inpatient therapy and have also opened to the hospitalist Dr. [name], and discuss details of the management including PICC line and repeat cultures. 02/12/2015-- -- was seen by Dr. [name] in the hospital and a PICC line was placed. He was to receive Ceftazidime 2  g every 12  hourly, oral levofloxacin 750 mg every 24 hourly and oral fluconazole 200 mg daily. The antibiotics were to be given for 4 weeks except the Diflucan was to be given for the first 2 weeks. Reviewed note from 02/10/2015 -- and Dr. [name] had recommended management for growth of MSSA and Serratia. He switched him from ceftazidime to ceftriaxone 2 g every 24 hours. Levofloxacin was stopped and he would continue on fluconazole for another week. He had asked me to decide whether further imaging was necessary and whether surgical debridement of the infected bone was needed. He is doing well and has been off work for this week and we will keep him off the next week. 02/22/2015 -- he was seen by my colleague on 01/19/2015 and at that time an incision and drainage was done on his right lateral forefoot on the dorsum. Today when I probed this wound it is frankly draining pus and it communicates with the ulcer on the plantar aspect of his right foot. The patient is still on IV ceftriaxone 2 g every 24 hours and is to be seen by Dr. [name] on Friday. 03/19/2015 -- On 03/04/2015 I spoke to Dr. 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 He recommended operative debridement which would probably result in the fifth metatarsal head and toe amputation.The patient would be referred back to [place] once he was done with surgery. He was admitted to [facility] yesterday and had surgery done by podiatry for a right fifth metatarsal acute osteomyelitis with cellulitis and abscess. He had a right foot incision and drainage with fifth metatarsal partial amputation and removal of toe infected bone and soft tissue with cultures. The wound was partially closed and packing of the distal end was done. Patient was already on cefepime 2 g IV every 8 hourly and put  on vancomycin pending final cultures. He had grown moderate gram-negative rods, and later found to be rare diphtheroids. I received a call from Dr. Cannon Kettle the podiatrist and we discussed the above. On 03/10/2015 he was found positive for influenza a and has been put on Tamiflu. Since his discharge he has been seen by Dr. Cannon Kettle who is planning to remove his sutures this coming week. He was reviewed by Dr. [name] on 03/17/2015 and his Diflucan was stopped and vancomycin. After the 3 doses he is taking. He is going to change Ceftazidime to Zosyn 3.375 g IV every 8 hours. 03/25/2015 -- he was seen by the podiatrist a couple of days ago and the sutures were removed. She will follow back with him in 4 weeks' time and at that time x-rays will be taken and a custom molded insert would be made for his shoe. 04/01/2015 -- he was seen by Dr. [name] on 03/29/2015 who pulled the PICC line stopped his IV antibiotics and recommended starting doxycycline and levofloxacin for 2 weeks. He also stop the fluconazole. The patient will follow up with him only when necessary. Readmission: 03/15/17 on evaluation today patient presents for initial evaluation concerning the new issues although he has previously been evaluated in our clinic. Unfortunately the previous evaluation led to the patient having to proceed to amputation and so he is somewhat nervous about being here VINEETH, FELL ([id number]) today. With that being said he has a very  slight blister that occurred on the plantar aspect more medial on the right great toe that has been present for just a very short amount of time, several days. With that being said he felt like initially when he called that he somewhat overreacted but due to the fact that his previous issue led to amputation he is very cautious these days I explained that he did the right thing. With that being said he has been tolerating the dressing changes without complication mainly  he just been covering this. He does not have any discomfort in secondary to neuropathy it's unlikely that he feels much. With that being said he does tell me that the issue that he had here is that he went barefoot when he knows he should not have which subsequently led to the blisters. He states is definitely not doing that anymore. His most recent hemoglobin A1c was December and registered 6.9 his ABI today was 1.1. 03/27/17 on evaluation today patient appears to be doing great in regard to his right great toe ulcer. He has been tolerating the dressing changes without complication. The good news is this is making excellent progress and he seems to be caring for this in an excellent fashion. I see no evidence of breakdown that would make me concerned that he was at risk for infection/amputation. This has obviously been his concern due to the fifth toe amputation that was necessitated previous when he had a similar issue. Nonetheless this seems to be progressing much more nicely. Readmission: 03/13/2019 upon evaluation today patient presents for reevaluation here in the clinic concerning issues he has been having with his right foot on the lateral portion at the proximal end of the metatarsal. Subsequently he tells me that this just opened up in the past couple of days or so and the erythema really began about 24 to 48 hours ago. Fortunately there is no signs of systemic infection. 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 That left him with this bony prominence that is where the wound is currently. There is erythema surrounding that does have me concerned about cellulitis. With that being said he was noncompressible as far as ABIs are concerned I do think we can need to check into arterial studies at this point. Patient does have a history of hypertension along with diabetes mellitus type 2. He also tends to develop quite a bit of callus white  often. 03/20/2019 upon evaluation today patient appears to be doing very well in regard to his wounds currently. He has been tolerating the dressing changes without complication. Fortunately there is no signs of infection. His ABIs were good at this point and registered at 1.07 on the left and 1.09 on the right. In regard to the x-ray this was negative as well for any signs of osteomyelitis. The patient also seems to be doing better in regard to the infection. He still has several days of the antibiotic left at this point best the Bactrim DS and he seems to be doing very well with this. Overall I am extremely pleased with the progress in 1 week's time he is going require some sharp debridement today however. 03/27/2019 upon evaluation today patient actually appears to be making some progress in regard to his wound. It is a little deeper but measuring smaller which is good news due to the fact that again were clear away some of the necrotic tissue which is why the depth is increasing  a little bit. Nonetheless after like were getting very close to being down at a good wound bed. Fortunately there is no signs of active infection at this time. There is a little bit of erythema immediately surrounding the wound that we do want to be very cognizant of and careful about. For that reason I am going to go ahead and extend his antibiotic today for an additional 10 days that is the Bactrim. 04/03/2019 upon evaluation today patient appears to be making some progress. I do feel like the Annitta Needs is doing a good job along with the alginate is being packed into the wound space behind. He has been tolerating the dressing changes without complication. Fortunately there is no signs of active infection at this time. No fevers, chills, nausea, vomiting, or diarrhea. 04/10/2019 upon evaluation today patient appears to be making progress. The wound is not quite as deep but he still does not have a great wound surface yet for  working on this and the Santyl does seem to be cleaning things up. Fortunately there is no evidence of active infection at this time. No fevers, chills, nausea, vomiting, or diarrhea. 04/17/2019 upon evaluation today patient appears to be doing excellent at this time in regard to his foot ulcer. I do believe the Santyl with the alginate packed in behind has been of benefit for him and I am very pleased in this regard. With that being said I am feeling like the wound surface is improving quite a bit each time I see him still I do not believe we are at the point of stating that the wound bed is perfect but again were making progress. The patient is going require some sharp debridement today. 4/22; this is a patient who is using Santyl with calcium alginate. Apparently his wound dimensions have been improving. Patient is changing the dressing himself. He has a surgical shoe. He works in a sitting position therefore is not on his feet all that much. There is undermining 05/01/2019 upon evaluation today patient appears to be doing a little better in regard to the size of his wound though he still has some depth. This does seem to be much cleaner has been using Santyl on the base of the wound followed by packing with silver alginate and behind. Nonetheless I do believe that based on what we are seeing we may need to switch to a collagen type dressing endoform may actually be excellent for him. Electronic Signature(s) Signed: 05/01/2019 9:05:48 AM By: [name] PA-C Entered By: [name] on 05/01/2019 09:05:48 [name] ([id number]) -------------------------------------------------------------------------------- Physical Exam Details Patient Name: ATUL, REICHMAN Date of Service: 05/01/2019 8:00 AM Medical Record Number: [id number] Patient Account Number: [id number] Date of Birth/Sex: 06/23/1965 (54 y.o. M) Treating RN: [name] Primary Care Provider: [name] Other  Clinician: Referring Provider: [name] Treating Provider/Extender: STONE III, Keeghan Bialy Weeks in Treatment: 7 Constitutional Well-nourished and well-hydrated in no acute distress. Respiratory normal breathing without difficulty. Psychiatric this patient is able to make decisions and demonstrates good insight into disease process. Alert and Oriented x 3. pleasant and cooperative. Notes Upon inspection patient's wound bed actually showed signs of good granulation at this time. Fortunately there is no evidence of significant slough buildup which is great news and overall very pleased in that regard. I do believe that the patient seems to be tolerating the dressing changes without complication. He may be a candidate for Dermagraft for something like this. Electronic Signature(s) Signed: 05/01/2019  9:06:15 AM By: [name] PA-C Entered By: [name] on 05/01/2019 09:06:15 [name] -------------------------------------------------------------------------------- Physician Orders Details Patient Name: [name] Date of Service: 05/01/2019 8:00 AM Medical Record Number: [id number] Patient Account Number: [id number] Date of Birth/Sex: 1965-04-23 (54 y.o. M) Treating RN: [name] Primary Care Provider: [name] Other Clinician: Referring Provider: [name] Treating Provider/Extender: Melburn Hake, Jiro Kiester Weeks in Treatment: 7 Verbal / Phone Orders: No Diagnosis Coding Wound Cleansing Wound #4 Right,Lateral Foot o Clean wound with Normal Saline. - in office o Dial antibacterial soap, wash wounds, rinse and pat dry prior to dressing wounds Primary Wound Dressing Wound #4 Right,Lateral Foot o Other: - Endoform Secondary Dressing Wound #4 Right,Lateral Foot o Gauze and Kerlix/Conform Dressing Change Frequency Wound #4 Right,Lateral Foot o Change dressing every other day. Edema Control Wound #4 Right,Lateral  Foot o Patient to wear own compression stockings - wear daily Off-Loading Wound #4 Right,Lateral Foot o Open toe surgical shoe with peg assist. Electronic Signature(s) Signed: 05/01/2019 12:47:05 PM By: [name] Signed: 05/01/2019 5:56:59 PM By: [name] PA-C Entered By: [name] on 05/01/2019 08:29:25 [name] -------------------------------------------------------------------------------- Problem List Details Patient Name: KENAAN, RUETER. Date of Service: 05/01/2019 8:00 AM Medical Record Number: [id number] Patient Account Number: [id number] Date of Birth/Sex: 04-13-1965 (54 y.o. 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 M) Treating RN: [name] Primary Care Provider: [name] Other Clinician: Referring Provider: [name] Treating Provider/Extender: Melburn Hake, Shardea Cwynar Weeks in Treatment:  7 Subjective Chief Complaint Information obtained from Patient Right lateral foot ulcer History of Present Illness (HPI) This 54 year old male comes with an ulcerated area on the plantar aspect of the right foot which she's had for approximately a month. I have known him from a previous visit at [facility] wound center and was treated in the months of April and May 2016 and rapidly healed a left plantar ulcer with a total contact cast. He has been a diabetic for about 16 years and tries to keep active and is fairly compliant with his diabetes management. 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 Past medical history significant for hypertension, hyperlipidemia, and status post appendectomy 1993. He does not smoke or drink alcohol. 01/14/2015 -- the patient had tolerated his total contact cast very well and had no problems and has had no systemic symptoms. However when his total contact cast was cut open he had excessive amount of purulent drainage in spite of being on antibiotics. He had had a recent x-ray done in the ER 12/22/2014 which showed IMPRESSION:No evidence of osseous erosion. Known soft tissue ulceration is not well characterized on radiograph. Scattered vascular calcifications seen. his last  hemoglobin A1c in December was 7.3. He has been on Augmentin and doxycycline for the last 2 weeks. 01/21/2015 -- his culture grew rare growth of Pantoea species an MR moderate growth of Candida parapsilosis. it is sensitive to levofloxacin. He has not heard back from the insurance company regarding his hyperbaric oxygen therapy. His MRI has not been done yet and we will try and get him an earlier date 01/28/2015 -- MRI was done last night -- IMPRESSION:1. Soft tissue ulcer overlying the plantar aspect of the fifth metatarsal head extending to the cortex. Subcortical marrow edema in the fifth metatarsal head with corresponding T1 hypointensity is concerning for early osteomyelitis of the  plantar lateral aspect of the fifth metatarsal head. Chest x-ray done on 01/14/2015 shows bronchiectatic changes without infiltrate. EKG done on generally 17 2017 shows a normal sinus rhythm and is a normal EKG. 02/04/2015 -- he was asked to see Dr. [name] last week and had 2 appointments but had to cancel both due to pressures of work. Last night he has woken up with severe pain in the foot and leg and it is swollen up. No fever or no change in his blood glucose. Addendum: I spoke with Dr. [name] who kindly agreed to accept the patient for inpatient therapy and have also opened to the hospitalist Dr. [name], and discuss details of the management including PICC line and repeat cultures. 02/12/2015-- -- was seen by Dr. [name] in the hospital and a PICC line was placed. He was to receive Ceftazidime 2 g every 12 hourly, oral levofloxacin 750 mg every 24 hourly and oral fluconazole 200 mg daily. The antibiotics were to be given for 4 weeks except the Diflucan was to be given for the first 2 weeks. Reviewed note from 02/10/2015 -- and Dr. [name] had recommended management for growth of MSSA and Serratia. He switched him from ceftazidime to ceftriaxone 2 g every 24 hours. Levofloxacin was stopped and he would continue on fluconazole for another week. He had asked me to decide whether further imaging was necessary and whether surgical debridement of the infected bone was needed. He is doing well and has been off work for this week and we will keep him off the next week. 02/22/2015 -- he was seen by my colleague on 01/19/2015 and at that time an incision and drainage was done on his right lateral forefoot on the dorsum. Today when I probed this wound it is frankly draining pus and it communicates with the ulcer on the plantar aspect of his right foot. The patient is still on IV ceftriaxone 2 g every 24 hours and is to be seen by Dr. [name] on Friday. 03/19/2015 -- On 03/04/2015 I spoke to  Dr. 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 He recommended operative debridement which would probably result in the fifth metatarsal head and toe amputation.The patient would be referred back to [place] once he was done with surgery. He was admitted to [facility] yesterday and had surgery done by podiatry for a right fifth metatarsal acute osteomyelitis with cellulitis and abscess. He had a right foot incision and drainage with fifth metatarsal partial amputation and removal of toe infected bone and soft tissue with cultures. The wound was partially closed and packing of the distal end was done. Patient was already  on cefepime 2 g IV every 8 hourly and put on vancomycin pending final cultures. He had grown moderate gram-negative rods, and later found to be rare diphtheroids. I received a call from Dr. Cannon Kettle the podiatrist and we discussed the above. On 03/10/2015 he was found positive for influenza a and has been put on Tamiflu. Since his discharge he has been seen by Dr. Cannon Kettle who is planning to remove his sutures this coming week. He was reviewed by Dr. [name] on 03/17/2015 and his Diflucan was stopped and vancomycin. After the 3 doses he is taking. He is going to change Ceftazidime to Zosyn 3.375 g IV every 8 hours. 03/25/2015 -- he was seen by the podiatrist a couple of days ago and the sutures were removed. She will follow back with him in 4 weeks' time and at that time x-rays will be taken and a custom molded insert would be made for his shoe. 04/01/2015 -- he was seen by Dr. [name] on 03/29/2015 who pulled the PICC line stopped his IV antibiotics and recommended starting doxycycline and levofloxacin for 2 weeks. He also stop the fluconazole. The patient will follow up with him only when necessary. IZACC, DORSETT ([id number]) Readmission: 03/15/17 on evaluation today patient presents for initial evaluation concerning the new issues although he has previously been evaluated in our clinic. Unfortunately the previous evaluation led to the patient having to proceed to amputation and so he is somewhat nervous about being here today. With that being said he has a very slight blister that occurred on the plantar aspect more medial on the right great toe that has been present for just a very short amount of time, several days. With that being said he felt like initially when he called that he somewhat overreacted but due to the fact that his previous issue led to amputation he is very cautious these days I explained that he did the right thing. With that being said he has been tolerating the dressing changes without complication mainly he just been covering this. He does not have any discomfort in secondary to neuropathy it's unlikely that he feels much. With that being said he does tell me that the issue that he had here is that he went barefoot when he knows he should not have which subsequently led to the blisters. He states is definitely not doing that anymore. His most recent hemoglobin A1c was December and registered 6.9 his ABI today was 1.1. 03/27/17 on evaluation today patient appears to be doing great in regard to his right great toe ulcer. He has been tolerating the dressing changes without complication. The good news is this is making excellent progress and he seems to be caring for this in an excellent fashion. I see no evidence of breakdown that would make me concerned that he was at risk for infection/amputation. This has obviously been his concern due to the fifth toe amputation that was necessitated previous when he had a similar issue. Nonetheless this seems to be progressing much more nicely. Readmission: 03/13/2019 upon evaluation today patient presents for reevaluation here in the clinic  concerning issues he has been having with his right foot on the lateral portion at the proximal end of the metatarsal. Subsequently he tells me that this just opened up in the past couple of days or so and the erythema really began about 24 to 48 hours ago. Fortunately there is no signs of systemic infection. 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 That left him with this bony prominence that is where the wound is currently. There is erythema surrounding that does have me concerned about cellulitis. With that being said he was noncompressible as far as ABIs are concerned I do think we can need to check into arterial studies at this point. Patient does have a history of hypertension along with diabetes mellitus type 2. He also tends to develop quite a bit of callus white often. 03/20/2019 upon evaluation today patient appears to be doing very well in regard to his wounds currently. He has been tolerating the dressing changes without complication. Fortunately there is no signs of infection. His ABIs were good at this point and registered at 1.07 on the left and 1.09 on the right. In regard to the x-ray this was negative as well for any signs of osteomyelitis. The patient also seems to be doing better in regard to the infection. He still has several days of the antibiotic left at this point best the Bactrim DS and he seems to be doing very well with this. Overall I am extremely pleased with the progress in 1 week's time he is going require some sharp debridement today however. 03/27/2019 upon evaluation today patient actually appears to be making some progress in regard to his wound. It is a little deeper but measuring smaller which is good news due to the fact that again were clear away some of the necrotic tissue which is why the depth is increasing a little bit. Nonetheless after like were getting very close to being down at a good wound bed. Fortunately  there is no signs of active infection at this time. There is a little bit of erythema immediately surrounding the wound that we do want to be very cognizant of and careful about. For that reason I am going to go ahead and extend his antibiotic today for an additional 10 days that is the Bactrim. 04/03/2019 upon evaluation today patient appears to be making some progress. I do feel like the Annitta Needs is doing a good job along with the alginate is being packed into the wound space behind. He has been tolerating the dressing changes without complication. Fortunately there is no signs of active infection at this time. No fevers, chills, nausea, vomiting, or diarrhea. 04/10/2019 upon evaluation today patient appears to be making progress. The wound is not quite as deep but he still does not have a great wound surface yet for working on this and the Santyl does seem to be cleaning things up. Fortunately there is no evidence of active infection at this time. No fevers, chills, nausea, vomiting, or diarrhea. 04/17/2019 upon evaluation today patient appears to be doing excellent at this time in regard to his foot ulcer. I do believe the Santyl with the alginate packed in behind has been of benefit for him and I am very pleased in this regard. With that being said I am feeling like the wound surface is improving quite a bit each time I see him still I do not believe we are at the point of stating that the wound bed is perfect but again were making progress. The patient is going require some sharp debridement today. 4/22; this is a patient who is using Santyl with calcium alginate. Apparently his wound dimensions have been improving. Patient is changing the dressing himself. He has a surgical shoe. He works in a sitting position therefore is not on his feet all that much.  There is undermining 05/01/2019 upon evaluation today patient appears to be doing a little better in regard to the size of his wound though he still  has some depth. This does seem to be much cleaner has been using Santyl on the base of the wound followed by packing with silver alginate and behind. Nonetheless I do believe that based on what we are seeing we may need to switch to a collagen type dressing endoform may actually be excellent for him. Objective Constitutional Well-nourished and well-hydrated in no acute distress. Vitals Time Taken: 8:14 AM, Height: 74 in, Weight: 308 lbs, BMI: 39.5, Temperature: 98.1 F, Pulse: 86 bpm, Respiratory Rate: 16 breaths/min, Blood Pressure: 157/70 mmHg. Respiratory normal breathing without difficulty. VOSS, KESSELRING ([id number]) Psychiatric this patient is able to make decisions and demonstrates good insight into disease process. Alert and Oriented x 3. pleasant and cooperative. General Notes: Upon inspection patient's wound bed actually showed signs of good granulation at this time. Fortunately there is no evidence of significant slough buildup which is great news and overall very pleased in that regard. I do believe that the patient seems to be tolerating the dressing changes without complication. He may be a candidate for Dermagraft for something like this. Integumentary (Hair, Skin) Wound #4 status is Open. Original cause of wound was Gradually Appeared. The wound is located on the Right,Lateral Foot. The wound measures 0.3cm length x 0.6cm width x 0.6cm depth; 0.141cm^2 area and 0.085cm^3 volume. There is Fat Layer (Subcutaneous Tissue) Exposed exposed. There is no tunneling or undermining noted. There is a medium amount of serous drainage noted. The wound margin is flat and intact. There is large (67-100%) pink granulation within the wound bed. There is a small (1-33%) amount of necrotic tissue within the wound bed including Adherent Slough. Assessment Active Problems ICD-10 Type 2 diabetes mellitus with foot ulcer Non-pressure chronic ulcer of other part of right foot with fat layer  exposed Corns and callosities Essential (primary) hypertension Acquired absence of other left toe(s) Plan Wound Cleansing: Wound #4 Right,Lateral Foot: Clean wound with Normal Saline. - in office Dial antibacterial soap, wash wounds, rinse and pat dry prior to dressing wounds Primary Wound Dressing: Wound #4 Right,Lateral Foot: Other: - Endoform Secondary Dressing: Wound #4 Right,Lateral Foot: Gauze and Kerlix/Conform Dressing Change Frequency: Wound #4 Right,Lateral Foot: Change dressing every other day. Edema Control: Wound #4 Right,Lateral Foot: Patient to wear own compression stockings - wear daily Off-Loading: Wound #4 Right,Lateral Foot: Open toe surgical shoe with peg assist. 1. My suggestion currently is can be that we go ahead and attempt endoform over the next week. The patient is in agreement with that plan. We will see where things stand at that point. If not doing significantly better I would consider Dermagraft as an option to look into for him. 2. I am also can recommend that the patient continue with wearing his compression stocking also think that he should wear the open toe surgical shoe with peg assist. We will see patient back for reevaluation in 1 week here in the clinic. If anything worsens or changes patient will contact our office for additional recommendations. Electronic Signature(s) Signed: 05/01/2019 9:06:55 AM By: [name] PA-C Entered By: [name] on 05/01/2019 09:06:55 [name] ([id number]) -------------------------------------------------------------------------------- SuperBill Details Patient Name: [name] Date of Service: 05/01/2019 Medical Record Number: [id number] Patient Account Number: [id number] Date of Birth/Sex: [birth year] (54 y.o. M) Treating RN: [name] Primary Care Provider: [name] Other  Clinician: Referring Provider: [name] Treating Provider/Extender: Melburn Hake,  Tayvion Lauder Weeks in Treatment: 7 Diagnosis Coding ICD-10 Codes Code Description E11.621 Type 2 diabetes mellitus with foot ulcer L97.512 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d L84 Corns and callosities I10 Essential (primary) hypertension Z89.422 Acquired absence of other left toe(s) Facility Procedures CPT4 Code: [id number] Description: 99213 - WOUND CARE VISIT-LEV 3 EST PT Modifier: Quantity: 1 Physician Procedures CPT4 Code: [id number] Description: [id number] - WC PHYS LEVEL 3 - EST PT Modifier: Quantity: 1 CPT4 Code: Description: ICD-10 Diagnosis Description E11.621 Type 2 diabetes mellitus with foot ulcer L97.512 Non-pressure chronic ulcer of other part of right foot with fat layer e I10 Essential (primary) hypertension L84 Corns and callosities Modifier: xposed Quantity: Electronic Signature(s) Signed: 05/01/2019 9:08:10 AM By: [name] PA-C Entered By: [name] on 05/01/2019 09:08:10

## 2019-05-08 ENCOUNTER — Other Ambulatory Visit: Payer: Self-pay

## 2019-05-08 ENCOUNTER — Other Ambulatory Visit
Admission: RE | Admit: 2019-05-08 | Discharge: 2019-05-08 | Disposition: A | Discharge status: Home / Self Care | Payer: Managed Care, Other (non HMO) | Source: Ambulatory Visit | Attending: Physician Assistant | Admitting: Physician Assistant

## 2019-05-08 ENCOUNTER — Encounter: Payer: Managed Care, Other (non HMO) | Attending: Physician Assistant | Admitting: Physician Assistant

## 2019-05-08 DIAGNOSIS — L84 Corns and callosities: Secondary | ICD-10-CM | POA: Insufficient documentation

## 2019-05-08 DIAGNOSIS — I10 Essential (primary) hypertension: Secondary | ICD-10-CM | POA: Insufficient documentation

## 2019-05-08 DIAGNOSIS — E785 Hyperlipidemia, unspecified: Secondary | ICD-10-CM | POA: Insufficient documentation

## 2019-05-08 DIAGNOSIS — L03115 Cellulitis of right lower limb: Secondary | ICD-10-CM | POA: Insufficient documentation

## 2019-05-08 DIAGNOSIS — E1140 Type 2 diabetes mellitus with diabetic neuropathy, unspecified: Secondary | ICD-10-CM | POA: Diagnosis not present

## 2019-05-08 DIAGNOSIS — E11621 Type 2 diabetes mellitus with foot ulcer: Secondary | ICD-10-CM | POA: Insufficient documentation

## 2019-05-08 DIAGNOSIS — Z89422 Acquired absence of other left toe(s): Secondary | ICD-10-CM | POA: Insufficient documentation

## 2019-05-08 DIAGNOSIS — B999 Unspecified infectious disease: Secondary | ICD-10-CM | POA: Insufficient documentation

## 2019-05-08 DIAGNOSIS — L97512 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d: Secondary | ICD-10-CM | POA: Diagnosis not present

## 2019-05-08 NOTE — Progress Notes (Signed)
DETRON, TURVEY ([id number]) Visit Report for 05/08/2019 Arrival Information Details Patient Name: [name], [name]. Date of Service: 05/08/2019 8:00 AM Medical Record Number: [id number] Patient Account Number: [id number] Date of Birth/Sex: 1965/02/16 (54 y.o. M) Treating RN: Montey Hora Primary Care Pearlina Friedly: [name] Other Clinician: Referring Ilian Wessell: [name] Treating Lennette Fader/Extender: Melburn Hake, HOYT Weeks in Treatment: 8 Visit Information History Since Last Visit Added or deleted any medications: No Patient Arrived: Ambulatory Any new allergies or adverse reactions: No Arrival Time: 08:09 Had a fall or experienced change in No Accompanied By: self activities of daily living that may affect Transfer Assistance: None risk of falls: Patient Identification Verified: Yes Signs or symptoms of abuse/neglect since last visito No Secondary Verification Process Completed: Yes Hospitalized since last visit: No Patient Has Alerts: Yes Implantable device outside of the clinic excluding No Patient Alerts: DMII cellular tissue based products placed in the center since last visit: Has Dressing in Place as Prescribed: Yes Pain Present Now: No Electronic Signature(s) Signed: 05/08/2019 4:19:43 PM By: Montey Hora Entered By: Montey Hora on 05/08/2019 08:09:39 [name] ([id number]) -------------------------------------------------------------------------------- Encounter Discharge Information Details Patient Name: [name] Date of Service: 05/08/2019 8:00 AM Medical Record Number: [id number] Patient Account Number: [id number] Date of Birth/Sex: 03/19/1965 (54 y.o. M) Treating RN: [name] Primary Care Festus Pursel: [name] Other Clinician: Referring Malynn Lucy: [name] Treating Romy Mcgue/Extender: Melburn Hake, HOYT Weeks in Treatment: 8 Encounter Discharge Information Items Post Procedure Vitals Discharge Condition:  Stable Temperature (F): 98.6 Ambulatory Status: Ambulatory Pulse (bpm): 74 Discharge Destination: Home Respiratory Rate (breaths/min): 16 Transportation: Private Auto Blood Pressure (mmHg): 150/65 Accompanied By: self Schedule Follow-up Appointment: No Clinical Summary of Care: Electronic Signature(s) Signed: 05/08/2019 9:18:35 AM By: [name] Entered By: [name] on 05/08/2019 08:31:01 [name] ([id number]) -------------------------------------------------------------------------------- Lower Extremity Assessment Details Patient Name: [name] Date of Service: 05/08/2019 8:00 AM Medical Record Number: [id number] Patient Account Number: [id number] Date of Birth/Sex: Dec 27, 1965 (54 y.o. M) Treating RN: Montey Hora Primary Care Cedra Villalon: [name] Other Clinician: Referring Valynn Schamberger: [name] Treating Tandy Grawe/Extender: STONE III, HOYT Weeks in Treatment: 8 Edema Assessment Assessed: [Left: No] [Right: No] Edema: [Left: Ye] [Right: s] Vascular Assessment Pulses: Dorsalis Pedis Palpable: [Right:Yes] Electronic Signature(s) Signed: 05/08/2019 4:19:43 PM By: Montey Hora Entered By: Montey Hora on 05/08/2019 08:10:55 [name] ([id number]) -------------------------------------------------------------------------------- Multi Wound Chart Details Patient Name: [name] Date of Service: 05/08/2019 8:00 AM Medical Record Number: [id number] Patient Account Number: [id number] Date of Birth/Sex: Oct 30, 1965 (54 y.o. M) Treating RN: [name] Primary Care Seamus Warehime: [name] Other Clinician: Referring Tashonna Descoteaux: [name] Treating Alva Kuenzel/Extender: Melburn Hake, HOYT Weeks in Treatment: 8 Vital Signs Height(in): 74 Pulse(bpm): 74 Weight(lbs): 308 Blood Pressure(mmHg): 150/65 Body Mass Index(BMI): 40 Temperature(F): 98.6 Respiratory Rate(breaths/min): 16 Photos: [N/A:N/A] Wound Location:  Right, Lateral Foot N/A N/A Wounding Event: Gradually Appeared N/A N/A Primary Etiology: Diabetic Wound/Ulcer of the Lower N/A N/A Extremity Comorbid History: Hypertension, Type II Diabetes, N/A N/A Neuropathy Date Acquired: 03/12/2019 N/A N/A Weeks of Treatment: 8 N/A N/A Wound Status: Open N/A N/A Pending Amputation on Yes N/A N/A Presentation: Measurements L x W x D (cm) 0.5x0.6x0.8 N/A N/A Area (cm) : 0.236 N/A N/A Volume (cm) : 0.188 N/A N/A % Reduction in Area: 62.90% N/A N/A % Reduction in Volume: -48.00% N/A N/A Classification: Grade 1 N/A N/A Exudate Amount: Medium N/A N/A Exudate Type: Serous N/A N/A Exudate Color: amber N/A N/A Wound Margin: Flat and Intact N/A N/A  Granulation Amount: Large (67-100%) N/A N/A Granulation Quality: Pink N/A N/A Necrotic Amount: Small (1-33%) N/A N/A Exposed Structures: Fat Layer (Subcutaneous Tissue) N/A N/A Exposed: Yes Fascia: No Tendon: No Muscle: No Joint: No Bone: No Epithelialization: None N/A N/A Treatment Notes Electronic Signature(s) Signed: 05/08/2019 9:18:35 AM By: [name] Entered By: [name] on 05/08/2019 08:23:00 [name] ([name] ([id number]) -------------------------------------------------------------------------------- [facility] Details Patient Name: [name], [name]. Date of Service: 05/08/2019 8:00 AM Medical Record Number: [id number] Patient Account Number: [id number] Date of Birth/Sex: 09-Mar-1965 (54 y.o. M) Treating RN: [name] Primary Care Roth Ress: [name] Other Clinician: Referring Wandra Babin: [name] Treating Johntae Broxterman/Extender: Melburn Hake, HOYT Weeks in Treatment: 8 Active Inactive Nutrition Nursing Diagnoses: Impaired glucose control: actual or potential Goals: Patient/caregiver verbalizes understanding of need to maintain therapeutic glucose control per primary care physician Date Initiated: 03/13/2019 Target  Resolution Date: 04/11/2019 Goal Status: Active Interventions: Assess patient nutrition upon admission and as needed per policy Notes: Orientation to the Wound Care Program Nursing Diagnoses: Knowledge deficit related to the wound healing center program Goals: Patient/caregiver will verbalize understanding of the [place] Date Initiated: 03/13/2019 Target Resolution Date: 04/11/2019 Goal Status: Active Interventions: Provide education on orientation to the wound center Notes: Wound/Skin Impairment Nursing Diagnoses: Impaired tissue integrity Goals: Ulcer/skin breakdown will have a volume reduction of 30% by week 4 Date Initiated: 03/13/2019 Target Resolution Date: 04/11/2019 Goal Status: Active Interventions: Assess ulceration(s) every visit Notes: Electronic Signature(s) Signed: 05/08/2019 9:18:35 AM By: [name] Entered By: [name] on 05/08/2019 08:22:53 [name] ([id number]) -------------------------------------------------------------------------------- Pain Assessment Details Patient Name: [name] Date of Service: 05/08/2019 8:00 AM Medical Record Number: [id number] Patient Account Number: [id number] Date of Birth/Sex: [birth year] (54 y.o. M) Treating RN: Montey Hora Primary Care Everlynn Sagun: [name] Other Clinician: Referring Keyna Blizard: [name] Treating Ladashia Demarinis/Extender: Melburn Hake, HOYT Weeks in Treatment: 8 Active Problems Location of Pain Severity and Description of Pain Patient Has Paino No Site Locations Pain Management and Medication Current Pain Management: Notes c/o tenderness Electronic Signature(s) Signed: 05/08/2019 4:19:43 PM By: Montey Hora Entered By: Montey Hora on 05/08/2019 08:09:54 [name] ([id number]) -------------------------------------------------------------------------------- Patient/Caregiver Education Details Patient Name: [name] Date of  Service: 05/08/2019 8:00 AM Medical Record Number: [id number] Patient Account Number: [id number] Date of Birth/Gender: [birth year] (54 y.o. M) Treating RN: [name] Primary Care Physician: [name] Other Clinician: Referring Physician: [name] Treating Physician/Extender: Sharalyn Ink in Treatment: 8 Education Assessment Education Provided To: Patient Education Topics Provided Wound/Skin Impairment: Handouts: Caring for Your Ulcer Methods: Demonstration, Explain/Verbal Responses: State content correctly Electronic Signature(s) Signed: 05/08/2019 9:18:35 AM By: [name] Entered By: [name] on 05/08/2019 08:30:10 [name] ([id number]) -------------------------------------------------------------------------------- Wound Assessment Details Patient Name: [name] Date of Service: 05/08/2019 8:00 AM Medical Record Number: [id number] Patient Account Number: [id number] Date of Birth/Sex: 1965-05-22 (55 y.o. M) Treating RN: Montey Hora Primary Care Amani Marseille: [name] Other Clinician: Referring Ruchama Kubicek: [name] Treating Chazz Philson/Extender: Melburn Hake, HOYT Weeks in Treatment: 8 Wound Status Wound Number: 4 Primary Etiology: Diabetic Wound/Ulcer of the Lower Extremity Wound Location: Right, Lateral Foot Wound Status: Open Wounding Event: Gradually Appeared Comorbid History: Hypertension, Type II Diabetes, Neuropathy Date Acquired: 03/12/2019 Weeks Of Treatment: 8 Clustered Wound: No Pending Amputation On Presentation Photos Wound Measurements Length: (cm) 0.5 % Redu Width: (cm) 0.6 % Redu Depth: (cm) 0.8 Epithe Area: (cm) 0.236 [place] Volume: (cm) 0.188 Under ction in  Area: 62.9% ction in Volume: -48% lialization: None ling: No mining: No Wound Description Classification: Grade 1 Foul O Wound Margin: Flat and Intact Slough Exudate Amount: Medium Exudate Type: Serous Exudate Color: amber dor After  Cleansing: No /Fibrino Yes Wound Bed Granulation Amount: Large (67-100%) Exposed Structure Granulation Quality: Pink Fascia Exposed: No Necrotic Amount: Small (1-33%) Fat Layer (Subcutaneous Tissue) Exposed: Yes Necrotic Quality: Adherent Slough Tendon Exposed: No Muscle Exposed: No Joint Exposed: No Bone Exposed: No Treatment Notes Wound #4 (Right, Lateral Foot) Notes scell, gauze, conform [name], [name] ([id number]) Electronic Signature(s) Signed: 05/08/2019 4:19:43 PM By: Montey Hora Entered By: Montey Hora on 05/08/2019 08:14:33 [name] ([id number]) -------------------------------------------------------------------------------- Vitals Details Patient Name: [name] Date of Service: 05/08/2019 8:00 AM Medical Record Number: [id number] Patient Account Number: [id number] Date of Birth/Sex: [birth year] (54 y.o. M) Treating RN: Montey Hora Primary Care Gorgeous [place]: [name] Other Clinician: Referring Montavius Subramaniam: [name] Treating Latrica Clowers/Extender: Melburn Hake, HOYT Weeks in Treatment: 8 Vital Signs Time Taken: 08:10 Temperature (F): 98.6 Height (in): 74 Pulse (bpm): 74 Weight (lbs): 308 Respiratory Rate (breaths/min): 16 Body Mass Index (BMI): 39.5 Blood Pressure (mmHg): 150/65 Reference Range: 80 - 120 mg / dl Electronic Signature(s) Signed: 05/08/2019 4:19:43 PM By: Montey Hora Entered By: Montey Hora on 05/08/2019 08:10:45

## 2019-05-09 NOTE — Progress Notes (Signed)
ISHAM, WICKMAN ([id number]) Visit Report for 05/08/2019 Chief Complaint Document Details Patient Name: [name], [name]. Date of Service: 05/08/2019 8:00 AM Medical Record Number: [id number] Patient Account Number: [id number] Date of Birth/Sex: 1965-01-22 (54 y.o. M) Treating RN: [name] Primary Care Provider: [name] Other Clinician: Referring Provider: [name] Treating Provider/Extender: Melburn Hake, Jameila Keeny Weeks in Treatment: 8 Information Obtained from: Patient Chief Complaint Right lateral foot ulcer Electronic Signature(s) Signed: 05/08/2019 8:39:41 AM By: [name] PA-C Entered By: [name] on 05/08/2019 08:39:41 [name] ([id number]) -------------------------------------------------------------------------------- Debridement Details Patient Name: [name] Date of Service: 05/08/2019 8:00 AM Medical Record Number: [id number] Patient Account Number: [id number] Date of Birth/Sex: Mar 10, 1965 (54 y.o. M) Treating RN: [name] Primary Care Provider: [name] Other Clinician: Referring Provider: [name] Treating Provider/Extender: Melburn Hake, Marilyn Nihiser Weeks in Treatment: 8 Debridement Performed for Wound #4 Right,Lateral Foot Assessment: Performed By: Physician STONE III, Jamileth Putzier E., PA-C Debridement Type: Debridement Severity of Tissue Pre Debridement: Fat layer exposed Level of Consciousness (Pre- Awake and Alert procedure): Pre-procedure Verification/Time Out Yes - 08:22 Taken: Start Time: 08:23 Pain Control: Lidocaine Total Area Debrided (L x W): 0.5 (cm) x 0.6 (cm) = 0.3 (cm) Tissue and other material Viable, Non-Viable, Callus, Subcutaneous debrided: Level: Skin/Subcutaneous Tissue Debridement Description: Excisional Instrument: Curette Bleeding: Minimum Hemostasis Achieved: Pressure End Time: 08:24 Response to Treatment: Procedure was tolerated well Level of Consciousness (Post- Awake  and Alert procedure): Post Debridement Measurements of Total Wound Length: (cm) 0.5 Width: (cm) 0.6 Depth: (cm) 0.8 Volume: (cm) 0.188 Character of Wound/Ulcer Post Debridement: Stable Severity of Tissue Post Debridement: Fat layer exposed Post Procedure Diagnosis Same as Pre-procedure Electronic Signature(s) Signed: 05/08/2019 9:18:35 AM By: [name] Signed: 05/08/2019 4:59:38 PM By: [name] PA-C Entered By: [name] on 05/08/2019 08:25:22 [name] ([id number]) -------------------------------------------------------------------------------- HPI Details Patient Name: [name] Date of Service: 05/08/2019 8:00 AM Medical Record Number: [id number] Patient Account Number: [id number] Date of Birth/Sex: December 16, 1965 (54 y.o. M) Treating RN: [name] Primary Care Provider: [name] Other Clinician: Referring Provider: [name] Treating Provider/Extender: Melburn Hake, Aarianna Hoadley Weeks in Treatment: 8 History of Present Illness HPI Description: This 54 year old male comes with an ulcerated area on the plantar aspect of the right foot which she's had for approximately a month. I have known him from a previous visit at [facility] wound center and was treated in the months of April and May 2016 and rapidly healed a left plantar ulcer with a total contact cast. He has been a diabetic for about 16 years and tries to keep active and is fairly compliant with his diabetes management. 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 Past medical history significant for hypertension, hyperlipidemia, and status post appendectomy 1993. He does not smoke or drink alcohol. 01/14/2015 -- the patient had tolerated his total contact cast very well and had no problems and has had no systemic symptoms. However when his total contact cast was cut open he had excessive amount of purulent drainage in spite of being on antibiotics. He had had a recent x-ray done in the ER  12/22/2014 which showed IMPRESSION:No evidence of osseous erosion. Known soft tissue ulceration is not well characterized on radiograph. Scattered vascular calcifications seen. his last hemoglobin A1c in December was 7.3. He has been on Augmentin and doxycycline for the last 2 weeks. 01/21/2015 -- his culture grew rare growth of Pantoea species an MR moderate growth of Candida parapsilosis. it is sensitive  to levofloxacin. He has not heard back from the insurance company regarding his hyperbaric oxygen therapy. His MRI has not been done yet and we will try and get him an earlier date 01/28/2015 -- MRI was done last night -- IMPRESSION:1. Soft tissue ulcer overlying the plantar aspect of the fifth metatarsal head extending to the cortex. Subcortical marrow edema in the fifth metatarsal head with corresponding T1 hypointensity is concerning for early osteomyelitis of the plantar lateral aspect of the fifth metatarsal head. Chest x-ray done on 01/14/2015 shows bronchiectatic changes without infiltrate. EKG done on generally 17 2017 shows a normal sinus rhythm and is a normal EKG. 02/04/2015 -- he was asked to see Dr. [name] last week and had 2 appointments but had to cancel both due to pressures of work. Last night he has woken up with severe pain in the foot and leg and it is swollen up. No fever or no change in his blood glucose. Addendum: I spoke with Dr. [name] who kindly agreed to accept the patient for inpatient therapy and have also opened to the hospitalist Dr. [name], and discuss details of the management including PICC line and repeat cultures. 02/12/2015-- -- was seen by Dr. [name] in the hospital and a PICC line was placed. He was to receive Ceftazidime 2 g every 12 hourly, oral levofloxacin 750 mg every 24 hourly and oral fluconazole 200 mg daily. The antibiotics were to be given for 4 weeks except the Diflucan was to be given for the first 2 weeks. Reviewed note from 02/10/2015  -- and Dr. [name] had recommended management for growth of MSSA and Serratia. He switched him from ceftazidime to ceftriaxone 2 g every 24 hours. Levofloxacin was stopped and he would continue on fluconazole for another week. He had asked me to decide whether further imaging was necessary and whether surgical debridement of the infected bone was needed. He is doing well and has been off work for this week and we will keep him off the next week. 02/22/2015 -- he was seen by my colleague on 01/19/2015 and at that time an incision and drainage was done on his right lateral forefoot on the dorsum. Today when I probed this wound it is frankly draining pus and it communicates with the ulcer on the plantar aspect of his right foot. The patient is still on IV ceftriaxone 2 g every 24 hours and is to be seen by Dr. [name] on Friday. 03/19/2015 -- On 03/04/2015 I spoke to Dr. 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 He recommended operative debridement which would probably result in the fifth metatarsal head and toe amputation.The patient would be referred back to [place] once he was done with surgery. He was admitted to [facility] yesterday and had surgery done by podiatry for a right fifth metatarsal acute osteomyelitis with cellulitis and abscess. He had a right foot incision and drainage with fifth metatarsal partial amputation and removal of toe infected bone and soft tissue with cultures. The wound was partially closed and packing of the distal end was done. Patient was already on cefepime 2 g IV every 8 hourly and put on vancomycin pending final cultures. He had grown moderate gram-negative rods, and later found to be rare diphtheroids. I received a call from Dr. Cannon Kettle the podiatrist and we  discussed the above. On 03/10/2015 he  was found positive for influenza a and has been put on Tamiflu. Since his discharge he has been seen by Dr. Cannon Kettle who is planning to remove his sutures this coming week. He was reviewed by Dr. [name] on 03/17/2015 and his Diflucan was stopped and vancomycin. After the 3 doses he is taking. He is going to change Ceftazidime to Zosyn 3.375 g IV every 8 hours. 03/25/2015 -- he was seen by the podiatrist a couple of days ago and the sutures were removed. She will follow back with him in 4 weeks' time and at that time x-rays will be taken and a custom molded insert would be made for his shoe. 04/01/2015 -- he was seen by Dr. [name] on 03/29/2015 who pulled the PICC line stopped his IV antibiotics and recommended starting doxycycline and levofloxacin for 2 weeks. He also stop the fluconazole. The patient will follow up with him only when necessary. Readmission: 03/15/17 on evaluation today patient presents for initial evaluation concerning the new issues although he has previously been evaluated in our clinic. Unfortunately the previous evaluation led to the patient having to proceed to amputation and so he is somewhat nervous about being here [name], [name] ([id number]) today. With that being said he has a very slight blister that occurred on the plantar aspect more medial on the right great toe that has been present for just a very short amount of time, several days. With that being said he felt like initially when he called that he somewhat overreacted but due to the fact that his previous issue led to amputation he is very cautious these days I explained that he did the right thing. With that being said he has been tolerating the dressing changes without complication mainly he just been covering this. He does not have any discomfort in secondary to neuropathy it's unlikely that he feels much. With that being said he does tell me that the issue that he had here is that he went barefoot  when he knows he should not have which subsequently led to the blisters. He states is definitely not doing that anymore. His most recent hemoglobin A1c was December and registered 6.9 his ABI today was 1.1. 03/27/17 on evaluation today patient appears to be doing great in regard to his right great toe ulcer. He has been tolerating the dressing changes without complication. The good news is this is making excellent progress and he seems to be caring for this in an excellent fashion. I see no evidence of breakdown that would make me concerned that he was at risk for infection/amputation. This has obviously been his concern due to the fifth toe amputation that was necessitated previous when he had a similar issue. Nonetheless this seems to be progressing much more nicely. Readmission: 03/13/2019 upon evaluation today patient presents for reevaluation here in the clinic concerning issues he has been having with his right foot on the lateral portion at the proximal end of the metatarsal. Subsequently he tells me that this just opened up in the past couple of days or so and the erythema really began about 24 to 48 hours ago. Fortunately there is no signs of systemic infection. 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 That left him with this bony prominence that is where the wound is currently. There is erythema surrounding that does have me concerned about cellulitis. With that being said he was noncompressible  as far as ABIs are concerned I do think we can need to check into arterial studies at this point. Patient does have a history of hypertension along with diabetes mellitus type 2. He also tends to develop quite a bit of callus white often. 03/20/2019 upon evaluation today patient appears to be doing very well in regard to his wounds currently. He has been tolerating the dressing changes without complication. Fortunately there is no signs of infection. His  ABIs were good at this point and registered at 1.07 on the left and 1.09 on the right. In regard to the x-ray this was negative as well for any signs of osteomyelitis. The patient also seems to be doing better in regard to the infection. He still has several days of the antibiotic left at this point best the Bactrim DS and he seems to be doing very well with this. Overall I am extremely pleased with the progress in 1 week's time he is going require some sharp debridement today however. 03/27/2019 upon evaluation today patient actually appears to be making some progress in regard to his wound. It is a little deeper but measuring smaller which is good news due to the fact that again were clear away some of the necrotic tissue which is why the depth is increasing a little bit. Nonetheless after like were getting very close to being down at a good wound bed. Fortunately there is no signs of active infection at this time. There is a little bit of erythema immediately surrounding the wound that we do want to be very cognizant of and careful about. For that reason I am going to go ahead and extend his antibiotic today for an additional 10 days that is the Bactrim. 04/03/2019 upon evaluation today patient appears to be making some progress. I do feel like the Annitta Needs is doing a good job along with the alginate is being packed into the wound space behind. He has been tolerating the dressing changes without complication. Fortunately there is no signs of active infection at this time. No fevers, chills, nausea, vomiting, or diarrhea. 04/10/2019 upon evaluation today patient appears to be making progress. The wound is not quite as deep but he still does not have a great wound surface yet for working on this and the Santyl does seem to be cleaning things up. Fortunately there is no evidence of active infection at this time. No fevers, chills, nausea, vomiting, or diarrhea. 04/17/2019 upon evaluation today patient appears  to be doing excellent at this time in regard to his foot ulcer. I do believe the Santyl with the alginate packed in behind has been of benefit for him and I am very pleased in this regard. With that being said I am feeling like the wound surface is improving quite a bit each time I see him still I do not believe we are at the point of stating that the wound bed is perfect but again were making progress. The patient is going require some sharp debridement today. 4/22; this is a patient who is using Santyl with calcium alginate. Apparently his wound dimensions have been improving. Patient is changing the dressing himself. He has a surgical shoe. He works in a sitting position therefore is not on his feet all that much. There is undermining 05/01/2019 upon evaluation today patient appears to be doing a little better in regard to the size of his wound though he still has some depth. This does seem to be much cleaner has been  using Santyl on the base of the wound followed by packing with silver alginate and behind. Nonetheless I do believe that based on what we are seeing we may need to switch to a collagen type dressing endoform may actually be excellent for him. 05/08/2019 upon evaluation today patient's wound actually appears to be showing better granulation tissue in the base of the wound. With that being said he does have some epiboly noted around the edges of the wound especially plantar and more distal. Subsequently this is can require sharp debridement today. Fortunately there is no signs of active systemic infection though I do feel like there is some local cellulitis noted at this point. The patient also notes has had increased drainage. Electronic Signature(s) Signed: 05/08/2019 8:45:15 AM By: [name] PA-C Entered By: [name] on 05/08/2019 08:45:14 [name] ([id number]) -------------------------------------------------------------------------------- Physical Exam  Details Patient Name: [name], [name] Date of Service: 05/08/2019 8:00 AM Medical Record Number: [id number] Patient Account Number: [id number] Date of Birth/Sex: June 15, 1965 (54 y.o. M) Treating RN: [name] Primary Care Provider: [name] Other Clinician: Referring Provider: [name] Treating Provider/Extender: STONE III, Ashlei Chinchilla Weeks in Treatment: 8 Constitutional Well-nourished and well-hydrated in no acute distress. Respiratory normal breathing without difficulty. Psychiatric this patient is able to make decisions and demonstrates good insight into disease process. Alert and Oriented x 3. pleasant and cooperative. Notes Upon inspection patient's wound again did have the epiboly which did require sharp debridement this was performed today with some bleeding we ended up having to use silver nitrate to achieve hemostasis prior to applying the dressing. Overall the patient tolerated this with some discomfort but was able to tolerate it. Electronic Signature(s) Signed: 05/08/2019 8:45:33 AM By: [name] PA-C Entered By: [name] on 05/08/2019 08:45:33 [name] ([id number]) -------------------------------------------------------------------------------- Physician Orders Details Patient Name: [name] Date of Service: 05/08/2019 8:00 AM Medical Record Number: [id number] Patient Account Number: [id number] Date of Birth/Sex: [birth year] (54 y.o. M) Treating RN: [name] Primary Care Provider: [name] Other Clinician: Referring Provider: [name] Treating Provider/Extender: Melburn Hake, Haynes Giannotti Weeks in Treatment: 8 Verbal / Phone Orders: No Diagnosis Coding Wound Cleansing Wound #4 Right,Lateral Foot o Clean wound with Normal Saline. - in office o Dial antibacterial soap, wash wounds, rinse and pat dry prior to dressing wounds Primary Wound Dressing Wound #4 Right,Lateral Foot o Silver Alginate Secondary  Dressing Wound #4 Right,Lateral Foot o Gauze and Kerlix/Conform Dressing Change Frequency Wound #4 Right,Lateral Foot o Change dressing every day. Edema Control Wound #4 Right,Lateral Foot o Patient to wear own compression stockings - wear daily Off-Loading Wound #4 Right,Lateral Foot o Open toe surgical shoe with peg assist. Laboratory o Bacteria identified in Wound by Culture (MICRO) - Right foot oooo LOINC Code: [id number] oooo Convenience Name: Wound culture routine Patient Medications Allergies: No Known Allergies Notifications Medication Indication Start End Levaquin 05/08/2019 DOSE 1 - oral 500 mg tablet - 1 tablet oral taken 1 time per day for 14 days Electronic Signature(s) Signed: 05/08/2019 8:42:00 AM By: [name] PA-C Entered By: [name] on 05/08/2019 08:42:00 [name] ([id number]) -------------------------------------------------------------------------------- Problem List Details Patient Name: [name] Date of Service: 05/08/2019 8:00 AM Medical Record Number: [id number] Patient Account Number: [id number] Date of Birth/Sex: [birth year] (54 y.o. 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 M) Treating RN: [name] Primary Care Provider: [name] Other Clinician: Referring Provider: [name] Treating Provider/Extender: Melburn Hake, Zania Kalisz Weeks in Treatment: 8 Subjective Chief Complaint Information obtained from Patient Right lateral foot ulcer History of Present Illness (HPI) This 54 year old male comes with an ulcerated area on the plantar aspect of the right foot which she's had for approximately a month. I have known him from a previous visit at [facility] wound center and was treated in the months of April and May 2016 and rapidly healed a left plantar ulcer with a total contact cast. He has been a diabetic for about 16 years and tries to keep active and is fairly compliant with his diabetes management. 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 Past medical history significant for hypertension, hyperlipidemia, and status post appendectomy 1993. He does not smoke or drink alcohol. 01/14/2015 -- the patient had tolerated his total contact cast very well and had no problems and has had no systemic symptoms. However when his total contact cast was cut open he had excessive amount of purulent drainage in spite of being on antibiotics. He had had a recent x-ray done in the ER 12/22/2014 which showed IMPRESSION:No evidence of osseous erosion. Known soft tissue ulceration is not well characterized on radiograph. Scattered vascular calcifications seen. his last hemoglobin A1c in December was 7.3. He has been on Augmentin and doxycycline for the last 2 weeks. 01/21/2015 -- his culture grew rare growth of Pantoea species an MR moderate growth of Candida parapsilosis. it is  sensitive to levofloxacin. He has not heard back from the insurance company regarding his hyperbaric oxygen therapy. His MRI has not been done yet and we will try and get him an earlier date 01/28/2015 -- MRI was done last night -- IMPRESSION:1. Soft tissue ulcer overlying the plantar aspect of the fifth metatarsal head extending to the cortex. Subcortical marrow edema in the fifth metatarsal head with corresponding T1 hypointensity is concerning for early osteomyelitis of the plantar lateral aspect of the fifth metatarsal head. Chest x-ray done on 01/14/2015 shows bronchiectatic changes without infiltrate. EKG done on generally 17 2017 shows a normal sinus rhythm and is a normal EKG. 02/04/2015 -- he was asked to see Dr. [name] last week and had 2 appointments but had to cancel both due to pressures of work. Last night he has woken up with severe pain in the foot and leg and it is swollen up. No fever or no change in his blood glucose. Addendum: I spoke with Dr. [name] who kindly agreed to accept the patient for inpatient therapy and have also opened to the hospitalist Dr. [name], and discuss details of the management including PICC line and repeat cultures. 02/12/2015-- -- was seen by Dr. [name] in the hospital and a PICC line was placed. He was to receive  Ceftazidime 2 g every 12 hourly, oral levofloxacin 750 mg every 24 hourly and oral fluconazole 200 mg daily. The antibiotics were to be given for 4 weeks except the Diflucan was to be given for the first 2 weeks. Reviewed note from 02/10/2015 -- and Dr. [name] had recommended management for growth of MSSA and Serratia. He switched him from ceftazidime to ceftriaxone 2 g every 24 hours. Levofloxacin was stopped and he would continue on fluconazole for another week. He had asked me to decide whether further imaging was necessary and whether surgical debridement of the infected bone was needed. He is doing well and has been off work  for this week and we will keep him off the next week. 02/22/2015 -- he was seen by my colleague on 01/19/2015 and at that time an incision and drainage was done on his right lateral forefoot on the dorsum. Today when I probed this wound it is frankly draining pus and it communicates with the ulcer on the plantar aspect of his right foot. The patient is still on IV ceftriaxone 2 g every 24 hours and is to be seen by Dr. [name] on Friday. 03/19/2015 -- On 03/04/2015 I spoke to Dr. 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 He recommended operative debridement which would probably result in the fifth metatarsal head and toe amputation.The patient would be referred back to [place] once he was done with surgery. 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 He had a right foot incision and drainage with fifth metatarsal partial amputation and removal of toe infected bone and soft tissue with cultures. The wound was partially closed and packing of the distal end was done. Patient was already on cefepime 2 g IV every 8 hourly and put on vancomycin pending final cultures. He had grown moderate gram-negative rods, and later found to be rare diphtheroids. I received a call from Dr. Cannon Kettle the podiatrist and we discussed the above. On 03/10/2015 he was found positive for influenza a and has been put on Tamiflu. Since his discharge he has been seen by Dr. Cannon Kettle who is planning to remove his sutures this coming week. He was reviewed by Dr. [name] on 03/17/2015 and his Diflucan was stopped and vancomycin. After the 3 doses he is taking. He is going to change Ceftazidime to Zosyn 3.375 g IV every 8 hours. 03/25/2015 -- he was seen by the  podiatrist a couple of days ago and the sutures were removed. She will follow back with him in 4 weeks' time and at that time x-rays will be taken and a custom molded insert would be made for his shoe. 04/01/2015 -- he was seen by Dr. [name] on 03/29/2015 who pulled the PICC line stopped his IV antibiotics and recommended starting doxycycline and levofloxacin for 2 weeks. He also stop the fluconazole. The patient will follow up with him only when necessary. [name], [name] ([id number]) Readmission: 03/15/17 on evaluation today patient presents for initial evaluation concerning the new issues although he has previously been evaluated in our clinic. Unfortunately the previous evaluation led to the patient having to proceed to amputation and so he is somewhat nervous about being here today. With that being said he has  a very slight blister that occurred on the plantar aspect more medial on the right great toe that has been present for just a very short amount of time, several days. With that being said he felt like initially when he called that he somewhat overreacted but due to the fact that his previous issue led to amputation he is very cautious these days I explained that he did the right thing. With that being said he has been tolerating the dressing changes without complication mainly he just been covering this. He does not have any discomfort in secondary to neuropathy it's unlikely that he feels much. With that being said he does tell me that the issue that he had here is that he went barefoot when he knows he should not have which subsequently led to the blisters. He states is definitely not doing that anymore. His most recent hemoglobin A1c was December and registered 6.9 his ABI today was 1.1. 03/27/17 on evaluation today patient appears to be doing great in regard to his right great toe ulcer. He has been tolerating the dressing changes without complication. The good news is this is  making excellent progress and he seems to be caring for this in an excellent fashion. I see no evidence of breakdown that would make me concerned that he was at risk for infection/amputation. This has obviously been his concern due to the fifth toe amputation that was necessitated previous when he had a similar issue. Nonetheless this seems to be progressing much more nicely. Readmission: 03/13/2019 upon evaluation today patient presents for reevaluation here in the clinic concerning issues he has been having with his right foot on the lateral portion at the proximal end of the metatarsal. Subsequently he tells me that this just opened up in the past couple of days or so and the erythema really began about 24 to 48 hours ago. Fortunately there is no signs of systemic infection. 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 That left him with this bony prominence that is where the wound is currently. There is erythema surrounding that does have me concerned about cellulitis. With that being said he was noncompressible as far as ABIs are concerned I do think we can need to check into arterial studies at this point. Patient does have a history of hypertension along with diabetes mellitus type 2. He also tends to develop quite a bit of callus white often. 03/20/2019 upon evaluation today patient appears to be doing very well in regard to his wounds currently. He has been tolerating the dressing changes without complication. Fortunately there is no signs of infection. His ABIs were good at this point and registered at 1.07 on the left and 1.09 on the right. In regard to the x-ray this was negative as well for any signs of osteomyelitis. The patient also seems to be doing better in regard to the infection. He still has several days of the antibiotic left at this point best the Bactrim DS and he seems to be doing very well with this. Overall I am extremely pleased  with the progress in 1 week's time he is going require some sharp debridement today however. 03/27/2019 upon evaluation today patient actually appears to be making some progress in regard to his wound. It is a little deeper but measuring smaller which is good news due to the fact that again were clear away some of the necrotic tissue which is why the depth  is increasing a little bit. Nonetheless after like were getting very close to being down at a good wound bed. Fortunately there is no signs of active infection at this time. There is a little bit of erythema immediately surrounding the wound that we do want to be very cognizant of and careful about. For that reason I am going to go ahead and extend his antibiotic today for an additional 10 days that is the Bactrim. 04/03/2019 upon evaluation today patient appears to be making some progress. I do feel like the Annitta Needs is doing a good job along with the alginate is being packed into the wound space behind. He has been tolerating the dressing changes without complication. Fortunately there is no signs of active infection at this time. No fevers, chills, nausea, vomiting, or diarrhea. 04/10/2019 upon evaluation today patient appears to be making progress. The wound is not quite as deep but he still does not have a great wound surface yet for working on this and the Santyl does seem to be cleaning things up. Fortunately there is no evidence of active infection at this time. No fevers, chills, nausea, vomiting, or diarrhea. 04/17/2019 upon evaluation today patient appears to be doing excellent at this time in regard to his foot ulcer. I do believe the Santyl with the alginate packed in behind has been of benefit for him and I am very pleased in this regard. With that being said I am feeling like the wound surface is improving quite a bit each time I see him still I do not believe we are at the point of stating that the wound bed is perfect but again were making  progress. The patient is going require some sharp debridement today. 4/22; this is a patient who is using Santyl with calcium alginate. Apparently his wound dimensions have been improving. Patient is changing the dressing himself. He has a surgical shoe. He works in a sitting position therefore is not on his feet all that much. There is undermining 05/01/2019 upon evaluation today patient appears to be doing a little better in regard to the size of his wound though he still has some depth. This does seem to be much cleaner has been using Santyl on the base of the wound followed by packing with silver alginate and behind. Nonetheless I do believe that based on what we are seeing we may need to switch to a collagen type dressing endoform may actually be excellent for him. 05/08/2019 upon evaluation today patient's wound actually appears to be showing better granulation tissue in the base of the wound. With that being said he does have some epiboly noted around the edges of the wound especially plantar and more distal. Subsequently this is can require sharp debridement today. Fortunately there is no signs of active systemic infection though I do feel like there is some local cellulitis noted at this point. The patient also notes has had increased drainage. Objective Constitutional Well-nourished and well-hydrated in no acute distress. Vitals Time Taken: 8:10 AM, Height: 74 in, Weight: 308 lbs, BMI: 39.5, Temperature: 98.6 F, Pulse: 74 bpm, Respiratory Rate: 16 breaths/min, [name], [name] J. ([id number]) Blood Pressure: 150/65 mmHg. Respiratory normal breathing without difficulty. Psychiatric this patient is able to make decisions and demonstrates good insight into disease process. Alert and Oriented x 3. pleasant and cooperative. General Notes: Upon inspection patient's wound again did have the epiboly which did require sharp debridement this was performed today with some bleeding we ended up  having to  use silver nitrate to achieve hemostasis prior to applying the dressing. Overall the patient tolerated this with some discomfort but was able to tolerate it. Integumentary (Hair, Skin) Wound #4 status is Open. Original cause of wound was Gradually Appeared. The wound is located on the Right,Lateral Foot. The wound measures 0.5cm length x 0.6cm width x 0.8cm depth; 0.236cm^2 area and 0.188cm^3 volume. There is Fat Layer (Subcutaneous Tissue) Exposed exposed. There is no tunneling or undermining noted. There is a medium amount of serous drainage noted. The wound margin is flat and intact. There is large (67-100%) pink granulation within the wound bed. There is a small (1-33%) amount of necrotic tissue within the wound bed including Adherent Slough. Assessment Active Problems ICD-10 Type 2 diabetes mellitus with foot ulcer 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d Corns and callosities Essential (primary) hypertension Acquired absence of other left toe(s) Procedures Wound #4 Pre-procedure diagnosis of Wound #4 is a Diabetic Wound/Ulcer of the Lower Extremity located on the Right,Lateral Foot .Severity of Tissue Pre Debridement is: Fat layer exposed. There was a Excisional Skin/Subcutaneous Tissue Debridement with a total area of 0.3 sq cm performed by STONE III, Traye Bates E., PA-C. With the following instrument(s): Curette to remove Viable and Non-Viable tissue/material. Material removed includes Callus and Subcutaneous Tissue and after achieving pain control using Lidocaine. A time out was conducted at 08:22, prior to the start of the procedure. A Minimum amount of bleeding was controlled with Pressure. The procedure was tolerated well. Post Debridement Measurements: 0.5cm length x 0.6cm width x 0.8cm depth; 0.188cm^3 volume. Character of Wound/Ulcer Post Debridement is stable. Severity of Tissue Post Debridement is: Fat layer exposed. Post procedure  Diagnosis Wound #4: Same as Pre-Procedure Plan Wound Cleansing: Wound #4 Right,Lateral Foot: Clean wound with Normal Saline. - in office Dial antibacterial soap, wash wounds, rinse and pat dry prior to dressing wounds Primary Wound Dressing: Wound #4 Right,Lateral Foot: Silver Alginate Secondary Dressing: Wound #4 Right,Lateral Foot: Gauze and Kerlix/Conform Dressing Change Frequency: Wound #4 Right,Lateral Foot: Change dressing every day. Edema Control: Wound #4 Right,Lateral Foot: Patient to wear own compression stockings - wear daily [name], [name]. ([id number]) Off-Loading: Wound #4 Right,Lateral Foot: Open toe surgical shoe with peg assist. Laboratory ordered were: Wound culture routine - Right foot The following medication(s) was prescribed: Levaquin oral 500 mg tablet 1 1 tablet oral taken 1 time per day for 14 days starting 05/08/2019 1. My suggestion at this time is good to be that we go ahead and continue with the silver alginate dressing but not using the Santyl as we did before. I think that the alginate is a better way to go which should help control the moisture in a more appropriate manner. 2. I am also can recommend at this time that we continue with the surgical shoe with peg assist to try to offload. I do think a total contact cast is an option as well I want to make sure that there is no infection before we even broached that subject. The other issue is this is the patient's right foot he would not be able to drive I am not sure that skin to be beneficial for him or even a possibility at this point. In the past he stated no. 3. I am also can recommend that we go ahead and place him on Levaquin. For short-term I think this is safe for him to take even with his diabetes medications. Subsequently we will see  where things stand at follow-up next week as well and I did obtain a culture we will see what the results of the culture show as well. We will see patient back  for reevaluation in 1 week here in the clinic. If anything worsens or changes patient will contact our office for additional recommendations. Electronic Signature(s) Signed: 05/08/2019 8:46:54 AM By: [name] PA-C Entered By: [name] on 05/08/2019 08:46:53 [name] ([id number]) -------------------------------------------------------------------------------- SuperBill Details Patient Name: [name] Date of Service: 05/08/2019 Medical Record Number: [id number] Patient Account Number: [id number] Date of Birth/Sex: 10-12-1965 (54 y.o. M) Treating RN: [name] Primary Care Provider: [name] Other Clinician: Referring Provider: [name] Treating Provider/Extender: Melburn Hake, Magdalynn Davilla Weeks in Treatment: 8 Diagnosis Coding ICD-10 Codes Code Description E11.621 Type 2 diabetes mellitus with foot ulcer L97.512 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d L84 Corns and callosities I10 Essential (primary) hypertension Z89.422 Acquired absence of other left toe(s) Facility Procedures CPT4 Code: [id number] Description: [id number] - DEB SUBQ TISSUE 20 SQ CM/< Modifier: Quantity: 1 CPT4 Code: Description: ICD-10 Diagnosis Description L97.512 Non-pressure chronic ulcer of other part of right foot with fat layer ex Modifier: posed Quantity: Physician Procedures CPT4 [id number] Description: [id number] - WC PHYS LEVEL 4 - EST PT Modifier: 25 Quantity: 1 CPT4 Code: Description: ICD-10 Diagnosis Description E11.621 Type 2 diabetes mellitus with foot ulcer L97.512 Non-pressure chronic ulcer of other part of right foot with fat layer ex L84 Corns and callosities I10 Essential (primary) hypertension Modifier: posed Quantity: CPT4 Code: [id number] Description: 11042 - WC PHYS SUBQ TISS 20 SQ CM Modifier: Quantity: 1 CPT4 Code: Description: ICD-10 Diagnosis Description L97.512 Non-pressure chronic ulcer of other part of  right foot with fat layer ex Modifier: posed Quantity: Electronic Signature(s) Signed: 05/08/2019 8:47:10 AM By: [name] PA-C Entered By: [name] on 05/08/2019 08:47:09

## 2019-05-11 LAB — AEROBIC CULTURE W GRAM STAIN (SUPERFICIAL SPECIMEN)

## 2019-05-14 ENCOUNTER — Other Ambulatory Visit: Payer: Self-pay

## 2019-05-14 ENCOUNTER — Ambulatory Visit (INDEPENDENT_AMBULATORY_CARE_PROVIDER_SITE_OTHER): Payer: Managed Care, Other (non HMO) | Admitting: Neurology

## 2019-05-14 DIAGNOSIS — K922 Gastrointestinal hemorrhage, unspecified: Secondary | ICD-10-CM

## 2019-05-14 DIAGNOSIS — L97509 Non-pressure chronic ulcer of other part of unspecified foot with unspecified severity: Secondary | ICD-10-CM

## 2019-05-14 DIAGNOSIS — G4733 Obstructive sleep apnea (adult) (pediatric): Secondary | ICD-10-CM

## 2019-05-14 DIAGNOSIS — M869 Osteomyelitis, unspecified: Secondary | ICD-10-CM

## 2019-05-14 DIAGNOSIS — D500 Iron deficiency anemia secondary to blood loss (chronic): Secondary | ICD-10-CM

## 2019-05-14 DIAGNOSIS — R42 Dizziness and giddiness: Secondary | ICD-10-CM

## 2019-05-14 DIAGNOSIS — G4710 Hypersomnia, unspecified: Secondary | ICD-10-CM

## 2019-05-14 DIAGNOSIS — E11621 Type 2 diabetes mellitus with foot ulcer: Secondary | ICD-10-CM

## 2019-05-15 ENCOUNTER — Encounter: Payer: Managed Care, Other (non HMO) | Admitting: Internal Medicine

## 2019-05-15 ENCOUNTER — Other Ambulatory Visit: Payer: Self-pay

## 2019-05-15 DIAGNOSIS — E11621 Type 2 diabetes mellitus with foot ulcer: Secondary | ICD-10-CM | POA: Diagnosis not present

## 2019-05-16 NOTE — Progress Notes (Signed)
WILLIES, OZAKI ([id number]) Visit Report for 05/15/2019 HPI Details Patient Name: [name], [name]. Date of Service: 05/15/2019 8:00 AM Medical Record Number: [id number] Patient Account Number: [id number] Date of Birth/Sex: May 05, 1965 (54 y.o. M) Treating RN: [name] Primary Care Provider: [name] Other Clinician: Referring Provider: [name] Treating Provider/Extender: Tito Dine in Treatment: 9 History of Present Illness HPI Description: This 54 year old male comes with an ulcerated area on the plantar aspect of the right foot which she's had for approximately a month. I have known him from a previous visit at [facility] wound center and was treated in the months of April and May 2016 and rapidly healed a left plantar ulcer with a total contact cast. He has been a diabetic for about 16 years and tries to keep active and is fairly compliant with his diabetes management. 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 Past medical history significant for hypertension, hyperlipidemia, and status post appendectomy 1993. He does not smoke or drink alcohol. 01/14/2015 -- the patient had tolerated his total contact cast very well and had no problems and has had no systemic symptoms. However when his total contact cast was cut open he had excessive amount of purulent drainage in spite of being on antibiotics. He had had a recent x-ray done in the ER 12/22/2014 which showed IMPRESSION:No evidence of osseous erosion. Known soft tissue ulceration is not well characterized on radiograph. Scattered vascular calcifications seen. his last hemoglobin A1c in December was 7.3. He has been on Augmentin and doxycycline for the last 2 weeks. 01/21/2015 -- his culture grew rare growth of Pantoea species an MR moderate growth of Candida parapsilosis. it is sensitive to levofloxacin. He has not heard back from the insurance company regarding his hyperbaric oxygen  therapy. His MRI has not been done yet and we will try and get him an earlier date 01/28/2015 -- MRI was done last night -- IMPRESSION:1. Soft tissue ulcer overlying the plantar aspect of the fifth metatarsal head extending to the cortex. Subcortical marrow edema in the fifth metatarsal head with corresponding T1 hypointensity is concerning for early osteomyelitis of the plantar lateral aspect of the fifth metatarsal head. Chest x-ray done on 01/14/2015 shows bronchiectatic changes without infiltrate. EKG done on generally 17 2017 shows a normal sinus rhythm and is a normal EKG. 02/04/2015 -- he was asked to see Dr. [name] last week and had 2 appointments but had to cancel both due to pressures of work. Last night he has woken up with severe pain in the foot and leg and it is swollen up. No fever or no change in his blood glucose. Addendum: I spoke with Dr. [name] who kindly agreed to accept the patient for inpatient therapy and have also opened to the hospitalist Dr. [name], and discuss details of the management including PICC line and repeat cultures. 02/12/2015-- -- was seen by Dr. [name] in the hospital and a PICC line was placed. He was to receive Ceftazidime 2 g every 12 hourly, oral levofloxacin 750 mg every 24 hourly and oral fluconazole 200 mg daily. The antibiotics were to be given for 4 weeks except the Diflucan was to be given for the first 2 weeks. Reviewed note from 02/10/2015 -- and Dr. [name] had recommended management for growth of MSSA and Serratia. He switched him from ceftazidime to ceftriaxone 2 g every 24 hours. Levofloxacin was stopped and he would continue on fluconazole for another week. He had asked me to decide whether further imaging  was necessary and whether surgical debridement of the infected bone was needed. He is doing well and has been off work for this week and we will keep him off the next week. 02/22/2015 -- he was seen by my colleague on 01/19/2015  and at that time an incision and drainage was done on his right lateral forefoot on the dorsum. Today when I probed this wound it is frankly draining pus and it communicates with the ulcer on the plantar aspect of his right foot. The patient is still on IV ceftriaxone 2 g every 24 hours and is to be seen by Dr. [name] on Friday. 03/19/2015 -- On 03/04/2015 I spoke to Dr. 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 He recommended operative debridement which would probably result in the fifth metatarsal head and toe amputation.The patient would be referred back to [place] once he was done with surgery. He was admitted to [facility] yesterday and had surgery done by podiatry for a right fifth metatarsal acute osteomyelitis with cellulitis and abscess. He had a right foot incision and drainage with fifth metatarsal partial amputation and removal of toe infected bone and soft tissue with cultures. The wound was partially closed and packing of the distal end was done. Patient was already on cefepime 2 g IV every 8 hourly and put on vancomycin pending final cultures. He had grown moderate gram-negative rods, and later found to be rare diphtheroids. I received a call from Dr. Cannon Kettle the podiatrist and we discussed the above. On 03/10/2015 he was found positive for influenza a and has been put on Tamiflu. Since his discharge he has been seen by Dr. Cannon Kettle who is planning to remove his sutures this coming week. He was reviewed by Dr. [name] on 03/17/2015 and his Diflucan was stopped and vancomycin. After the 3 doses he is taking. He is going to change Ceftazidime to Zosyn 3.375 g IV every 8 hours. 03/25/2015 -- he was seen by the podiatrist a couple of days ago and the sutures were removed. She will follow back with him in 4 weeks' time and  at that time x-rays will be taken and a custom molded insert would be made for his shoe. 04/01/2015 -- he was seen by Dr. [name] on 03/29/2015 who pulled the PICC line stopped his IV antibiotics and recommended starting doxycycline and levofloxacin for 2 weeks. He also stop the fluconazole. The patient will follow up with him only when necessary. ANTWAUN, BERNICK ([id number]) Readmission: 03/15/17 on evaluation today patient presents for initial evaluation concerning the new issues although he has previously been evaluated in our clinic. Unfortunately the previous evaluation led to the patient having to proceed to amputation and so he is somewhat nervous about being here today. With that being said he has a very slight blister that occurred on the plantar aspect more medial on the right great toe that has been present for just a very short amount of time, several days. With that being said he felt like initially when he called that he somewhat overreacted but due to the fact that his previous issue led to amputation he is very cautious these days I explained that he did the right thing. With that being said he has been tolerating the dressing changes without complication mainly he just been covering  this. He does not have any discomfort in secondary to neuropathy it's unlikely that he feels much. With that being said he does tell me that the issue that he had here is that he went barefoot when he knows he should not have which subsequently led to the blisters. He states is definitely not doing that anymore. His most recent hemoglobin A1c was December and registered 6.9 his ABI today was 1.1. 03/27/17 on evaluation today patient appears to be doing great in regard to his right great toe ulcer. He has been tolerating the dressing changes without complication. The good news is this is making excellent progress and he seems to be caring for this in an excellent fashion. I see no evidence of  breakdown that would make me concerned that he was at risk for infection/amputation. This has obviously been his concern due to the fifth toe amputation that was necessitated previous when he had a similar issue. Nonetheless this seems to be progressing much more nicely. Readmission: 03/13/2019 upon evaluation today patient presents for reevaluation here in the clinic concerning issues he has been having with his right foot on the lateral portion at the proximal end of the metatarsal. Subsequently he tells me that this just opened up in the past couple of days or so and the erythema really began about 24 to 48 hours ago. Fortunately there is no signs of systemic infection. 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 That left him with this bony prominence that is where the wound is currently. There is erythema surrounding that does have me concerned about cellulitis. With that being said he was noncompressible as far as ABIs are concerned I do think we can need to check into arterial studies at this point. Patient does have a history of hypertension along with diabetes mellitus type 2. He also tends to develop quite a bit of callus white often. 03/20/2019 upon evaluation today patient appears to be doing very well in regard to his wounds currently. He has been tolerating the dressing changes without complication. Fortunately there is no signs of infection. His ABIs were good at this point and registered at 1.07 on the left and 1.09 on the right. In regard to the x-ray this was negative as well for any signs of osteomyelitis. The patient also seems to be doing better in regard to the infection. He still has several days of the antibiotic left at this point best the Bactrim DS and he seems to be doing very well with this. Overall I am extremely pleased with the progress in 1 week's time he is going require some sharp debridement today however. 03/27/2019 upon  evaluation today patient actually appears to be making some progress in regard to his wound. It is a little deeper but measuring smaller which is good news due to the fact that again were clear away some of the necrotic tissue which is why the depth is increasing a little bit. Nonetheless after like were getting very close to being down at a good wound bed. Fortunately there is no signs of active infection at this time. There is a little bit of erythema immediately surrounding the wound that we do want to be very cognizant of and careful about. For that reason I am going to go ahead and extend his antibiotic today for an additional 10 days that is the Bactrim. 04/03/2019 upon evaluation today patient appears to be making some progress. I do  feel like the Annitta Needs is doing a good job along with the alginate is being packed into the wound space behind. He has been tolerating the dressing changes without complication. Fortunately there is no signs of active infection at this time. No fevers, chills, nausea, vomiting, or diarrhea. 04/10/2019 upon evaluation today patient appears to be making progress. The wound is not quite as deep but he still does not have a great wound surface yet for working on this and the Santyl does seem to be cleaning things up. Fortunately there is no evidence of active infection at this time. No fevers, chills, nausea, vomiting, or diarrhea. 04/17/2019 upon evaluation today patient appears to be doing excellent at this time in regard to his foot ulcer. I do believe the Santyl with the alginate packed in behind has been of benefit for him and I am very pleased in this regard. With that being said I am feeling like the wound surface is improving quite a bit each time I see him still I do not believe we are at the point of stating that the wound bed is perfect but again were making progress. The patient is going require some sharp debridement today. 4/22; this is a patient who is using  Santyl with calcium alginate. Apparently his wound dimensions have been improving. Patient is changing the dressing himself. He has a surgical shoe. He works in a sitting position therefore is not on his feet all that much. There is undermining 05/01/2019 upon evaluation today patient appears to be doing a little better in regard to the size of his wound though he still has some depth. This does seem to be much cleaner has been using Santyl on the base of the wound followed by packing with silver alginate and behind. Nonetheless I do believe that based on what we are seeing we may need to switch to a collagen type dressing endoform may actually be excellent for him. 05/08/2019 upon evaluation today patient's wound actually appears to be showing better granulation tissue in the base of the wound. With that being said he does have some epiboly noted around the edges of the wound especially plantar and more distal. Subsequently this is can require sharp debridement today. Fortunately there is no signs of active systemic infection though I do feel like there is some local cellulitis noted at this point. The patient also notes has had increased drainage. 5/13; wound volume quite a bit improved this week down to 3 mm. Patient states pain and drainage better. Culture from last week grew staph aureus [not MRSA] and this is sensitive to quinolones and he is on Levaquin. HOWEVER he also grew abundant Enterococcus faecalis treatment of choice for this is ampicillin. Quinolone coverage is unreliable Electronic Signature(s) Signed: 05/15/2019 5:28:47 PM By: Linton Ham MD Entered By: Linton Ham on 05/15/2019 08:27:52 [name] ([id number]) -------------------------------------------------------------------------------- Physical Exam Details Patient Name: KAVI, UNZICKER Date of Service: 05/15/2019 8:00 AM Medical Record Number: [id number] Patient Account Number: [id number] Date of  Birth/Sex: Nov 20, 1965 (54 y.o. M) Treating RN: [name] Primary Care Provider: [name] Other Clinician: Referring Provider: [name] Treating Provider/Extender: Tito Dine in Treatment: 9 Constitutional Patient is hypertensive.. Pulse regular and within target range for patient.Marland Kitchen Respirations regular, non-labored and within target range.. Temperature is normal and within the target range for the patient.Marland Kitchen appears in no distress. Cardiovascular Pedal pulses palpable.. Notes Wound exam; right lateral foot. Looks like it is contracted quite a bit. There is  some debris at the surface although given the improvement in volume I elected not to debride this today. Silver alginate still sounds like it is the correct option here. No surrounding infection no tenderness Electronic Signature(s) Signed: 05/15/2019 5:28:47 PM By: Linton Ham MD Entered By: Linton Ham on 05/15/2019 08:30:18 [name] ([id number]) -------------------------------------------------------------------------------- Physician Orders Details Patient Name: [name] Date of Service: 05/15/2019 8:00 AM Medical Record Number: [id number] Patient Account Number: [id number] Date of Birth/Sex: Sep 11, 1965 (54 y.o. M) Treating RN: [name] Primary Care Provider: [name] Other Clinician: Referring Provider: [name] Treating Provider/Extender: Tito Dine in Treatment: 9 Verbal / Phone Orders: No Diagnosis Coding Wound Cleansing Wound #4 Right,Lateral Foot o Clean wound with Normal Saline. - in office o Dial antibacterial soap, wash wounds, rinse and pat dry prior to dressing wounds Primary Wound Dressing Wound #4 Right,Lateral Foot o Silver Alginate Secondary Dressing Wound #4 Right,Lateral Foot o Gauze and Kerlix/Conform Dressing Change Frequency Wound #4 Right,Lateral Foot o Change dressing every day. Edema  Control Wound #4 Right,Lateral Foot o Patient to wear own compression stockings - wear daily Off-Loading Wound #4 Right,Lateral Foot o Open toe surgical shoe with peg assist. Patient Medications Allergies: No Known Allergies Notifications Medication Indication Start End Augmentin wound infection 05/15/2019 DOSE oral 875 mg-125 mg tablet - 1 tablet oral bid for 10 days (may d/c Levaquin) Electronic Signature(s) Signed: 05/15/2019 8:34:41 AM By: Linton Ham MD Entered By: Linton Ham on 05/15/2019 08:34:40 [name] ([id number]) -------------------------------------------------------------------------------- Problem List Details Patient Name: [name] Date of Service: 05/15/2019 8:00 AM Medical Record Number: [id number] Patient Account Number: [id number] Date of Birth/Sex: 09-12-1965 (54 y.o. 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 M) Treating RN: [name] Primary Care Provider: [name] Other Clinician: Referring Provider: [name] Treating Provider/Extender: Tito Dine in Treatment: 9 Subjective History of Present Illness (HPI) This 54 year old male comes with an ulcerated area on the plantar aspect of the right foot which she's had for approximately a month. I have known him from a previous visit at [facility] wound center and was treated in the months of April and May 2016 and rapidly healed a left plantar ulcer with a total contact cast. He has been a diabetic for about 16 years and tries to keep active and is fairly compliant with his diabetes management. 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 Past medical history significant for hypertension, hyperlipidemia, and status post appendectomy 1993. He does not smoke or drink alcohol. 01/14/2015 -- the patient had tolerated his total contact cast very well and had no problems and has had no systemic symptoms. However  when his total contact cast was cut open he had excessive amount of purulent drainage in spite of being on antibiotics. He had had a recent x-ray done in the ER 12/22/2014 which showed IMPRESSION:No evidence of osseous erosion. Known soft tissue ulceration is not well characterized on radiograph. Scattered vascular calcifications seen. his last hemoglobin A1c in December was 7.3. He has been on Augmentin and doxycycline for the last 2 weeks. 01/21/2015 -- his culture grew rare growth of Pantoea species an MR moderate growth of Candida parapsilosis. it is sensitive to levofloxacin. He has not heard back from the insurance company regarding his hyperbaric oxygen therapy. His MRI has not been done yet and we will try and get him an earlier date 01/28/2015 -- MRI was done last night -- IMPRESSION:1. Soft tissue ulcer overlying the plantar aspect of the  fifth metatarsal head extending to the cortex. Subcortical marrow edema in the fifth metatarsal head with corresponding T1 hypointensity is concerning for early osteomyelitis of the plantar lateral aspect of the fifth metatarsal head. Chest x-ray done on 01/14/2015 shows bronchiectatic changes without infiltrate. EKG done on generally 17 2017 shows a normal sinus rhythm and is a normal EKG. 02/04/2015 -- he was asked to see Dr. [name] last week and had 2 appointments but had to cancel both due to pressures of work. Last night he has woken up with severe pain in the foot and leg and it is swollen up. No fever or no change in his blood glucose. Addendum: I spoke with Dr. [name] who kindly agreed to accept the patient for inpatient therapy and have also opened to the hospitalist Dr. [name], and discuss details of the management including PICC line and repeat cultures. 02/12/2015-- -- was seen by Dr. [name] in the hospital and a PICC line was placed. He was to receive Ceftazidime 2 g every 12 hourly, oral levofloxacin 750 mg every 24 hourly and oral fluconazole 200 mg daily. The antibiotics were to be given for 4 weeks except the Diflucan was to be given for the first 2 weeks. Reviewed note from 02/10/2015 -- and Dr. [name] had recommended management for growth of MSSA and Serratia. He switched him from ceftazidime to ceftriaxone 2 g every 24 hours. Levofloxacin was stopped and he would continue on fluconazole for another week. He had asked me to decide whether further imaging was necessary and whether surgical debridement of the infected bone was needed. He is doing well and has been off work for this week and we will keep him off the next week. 02/22/2015 -- he was seen by my colleague on 01/19/2015 and at that time an incision and drainage was done on his right lateral forefoot on the dorsum. Today when I probed this wound it is frankly draining pus and it communicates with the ulcer on  the plantar aspect of his right foot. The patient is still on IV ceftriaxone 2 g every 24 hours and is to be seen by Dr. [name] on Friday. 03/19/2015 -- On 03/04/2015 I spoke to Dr. 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 He recommended operative debridement which would probably result in the fifth metatarsal head and toe amputation.The patient would be referred back to [place] once he was done with surgery. He was admitted to [facility] yesterday and had  surgery done by podiatry for a right fifth metatarsal acute osteomyelitis with cellulitis and abscess. He had a right foot incision and drainage with fifth metatarsal partial amputation and removal of toe infected bone and soft tissue with cultures. The wound was partially closed and packing of the distal end was done. Patient was already on cefepime 2 g IV every 8 hourly and put on vancomycin pending final cultures. He had grown moderate gram-negative rods, and later found to be rare diphtheroids. I received a call from Dr. Cannon Kettle the podiatrist and we discussed the above. On 03/10/2015 he was found positive for influenza a and has been put on Tamiflu. Since his discharge he has been seen by Dr. Cannon Kettle who is planning to remove his sutures this coming week. He was reviewed by Dr. [name] on 03/17/2015 and his Diflucan was stopped and vancomycin. After the 3 doses he is taking. He is going to change Ceftazidime to Zosyn 3.375 g IV every 8 hours. 03/25/2015 -- he was seen by the podiatrist a couple of days ago and the sutures were removed. She will follow back with him in 4 weeks' time and at that time x-rays will be taken and a custom molded insert would be made for his shoe. 04/01/2015 -- he was seen by Dr. [name] on 03/29/2015 who pulled the PICC line stopped his IV  antibiotics and recommended starting doxycycline and levofloxacin for 2 weeks. He also stop the fluconazole. The patient will follow up with him only when necessary. Readmission: 03/15/17 on evaluation today patient presents for initial evaluation concerning the new issues although he has previously been evaluated in our clinic. Unfortunately the previous evaluation led to the patient having to proceed to amputation and so he is somewhat nervous about being here today. With that being said he has a very slight blister that occurred on the plantar aspect more medial on the right great toe that has been [place], BRAXTIN FILIPOWICZ. ([id number]) present for just a very short amount of time, several days. With that being said he felt like initially when he called that he somewhat overreacted but due to the fact that his previous issue led to amputation he is very cautious these days I explained that he did the right thing. With that being said he has been tolerating the dressing changes without complication mainly he just been covering this. He does not have any discomfort in secondary to neuropathy it's unlikely that he feels much. With that being said he does tell me that the issue that he had here is that he went barefoot when he knows he should not have which subsequently led to the blisters. He states is definitely not doing that anymore. His most recent hemoglobin A1c was December and registered 6.9 his ABI today was 1.1. 03/27/17 on evaluation today patient appears to be doing great in regard to his right great toe ulcer. He has been tolerating the dressing changes without complication. The good news is this is making excellent progress and he seems to be caring for this in an excellent fashion. I see no evidence of breakdown that would make me concerned that he was at risk for infection/amputation. This has obviously been his concern due to the fifth toe amputation that was necessitated previous when he  had a similar issue. Nonetheless this seems to be progressing much more nicely. Readmission: 03/13/2019 upon evaluation today patient presents for reevaluation here in the clinic concerning issues he has been having with his right foot on  the lateral portion at the proximal end of the metatarsal. Subsequently he tells me that this just opened up in the past couple of days or so and the erythema really began about 24 to 48 hours ago. Fortunately there is no signs of systemic infection. 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 That left him with this bony prominence that is where the wound is currently. There is erythema surrounding that does have me concerned about cellulitis. With that being said he was noncompressible as far as ABIs are concerned I do think we can need to check into arterial studies at this point. Patient does have a history of hypertension along with diabetes mellitus type 2. He also tends to develop quite a bit of callus white often. 03/20/2019 upon evaluation today patient appears to be doing very well in regard to his wounds currently. He has been tolerating the dressing changes without complication. Fortunately there is no signs of infection. His ABIs were good at this point and registered at 1.07 on the left and 1.09 on the right. In regard to the x-ray this was negative as well for any signs of osteomyelitis. The patient also seems to be doing better in regard to the infection. He still has several days of the antibiotic left at this point best the Bactrim DS and he seems to be doing very well with this. Overall I am extremely pleased with the progress in 1 week's time he is going require some sharp debridement today however. 03/27/2019 upon evaluation today patient actually appears to be making some progress in regard to his wound. It is a little deeper but measuring smaller which is good news due to the fact that again were  clear away some of the necrotic tissue which is why the depth is increasing a little bit. Nonetheless after like were getting very close to being down at a good wound bed. Fortunately there is no signs of active infection at this time. There is a little bit of erythema immediately surrounding the wound that we do want to be very cognizant of and careful about. For that reason I am going to go ahead and extend his antibiotic today for an additional 10 days that is the Bactrim. 04/03/2019 upon evaluation today patient appears to be making some progress. I do feel like the Annitta Needs is doing a good job along with the alginate is being packed into the wound space behind. He has been tolerating the dressing changes without complication. Fortunately there is no signs of active infection at this time. No fevers, chills, nausea, vomiting, or diarrhea. 04/10/2019 upon evaluation today patient appears to be making progress. The wound is not quite as deep but he still does not have a great wound surface yet for working on this and the Santyl does seem to be cleaning things up. Fortunately there is no evidence of active infection at this time. No fevers, chills, nausea, vomiting, or diarrhea. 04/17/2019 upon evaluation today patient appears to be doing excellent at this time in regard to his foot ulcer. I do believe the Santyl with the alginate packed in behind has been of benefit for him and I am very pleased in this regard. With that being said I am feeling like the wound surface is improving quite a bit each time I see him still I do not believe we are at the point of stating that the wound bed is perfect but again were making  progress. The patient is going require some sharp debridement today. 4/22; this is a patient who is using Santyl with calcium alginate. Apparently his wound dimensions have been improving. Patient is changing the dressing himself. He has a surgical shoe. He works in a sitting position therefore  is not on his feet all that much. There is undermining 05/01/2019 upon evaluation today patient appears to be doing a little better in regard to the size of his wound though he still has some depth. This does seem to be much cleaner has been using Santyl on the base of the wound followed by packing with silver alginate and behind. Nonetheless I do believe that based on what we are seeing we may need to switch to a collagen type dressing endoform may actually be excellent for him. 05/08/2019 upon evaluation today patient's wound actually appears to be showing better granulation tissue in the base of the wound. With that being said he does have some epiboly noted around the edges of the wound especially plantar and more distal. Subsequently this is can require sharp debridement today. Fortunately there is no signs of active systemic infection though I do feel like there is some local cellulitis noted at this point. The patient also notes has had increased drainage. 5/13; wound volume quite a bit improved this week down to 3 mm. Patient states pain and drainage better. Culture from last week grew staph aureus [not MRSA] and this is sensitive to quinolones and he is on Levaquin. HOWEVER he also grew abundant Enterococcus faecalis treatment of choice for this is ampicillin. Quinolone coverage is unreliable Objective Constitutional Patient is hypertensive.. Pulse regular and within target range for patient.Marland Kitchen Respirations regular, non-labored and within target range.. Temperature is normal and within the target range for the patient.Marland Kitchen appears in no distress. Vitals Time Taken: 8:10 AM, Height: 74 in, Weight: 308 lbs, BMI: 39.5, Temperature: 98.1 F, Pulse: 85 bpm, Respiratory Rate: 16 breaths/min, Blood Pressure: 149/68 mmHg. LEEANTHONY, MCBANE ([id number]) Cardiovascular Pedal pulses palpable.. General Notes: Wound exam; right lateral foot. Looks like it is contracted quite a bit. There is some debris  at the surface although given the improvement in volume I elected not to debride this today. Silver alginate still sounds like it is the correct option here. No surrounding infection no tenderness Integumentary (Hair, Skin) Wound #4 status is Open. Original cause of wound was Gradually Appeared. The wound is located on the Right,Lateral Foot. The wound measures 0.5cm length x 0.5cm width x 0.3cm depth; 0.196cm^2 area and 0.059cm^3 volume. There is Fat Layer (Subcutaneous Tissue) Exposed exposed. There is no tunneling or undermining noted. There is a medium amount of serous drainage noted. The wound margin is flat and intact. There is large (67-100%) pink granulation within the wound bed. There is a small (1-33%) amount of necrotic tissue within the wound bed including Adherent Slough. Assessment Active Problems ICD-10 Type 2 diabetes mellitus with foot ulcer 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d Cellulitis of right lower limb Corns and callosities Essential (primary) hypertension Acquired absence of other left toe(s) Plan Wound Cleansing: Wound #4 Right,Lateral Foot: Clean wound with Normal Saline. - in office Dial antibacterial soap, wash wounds, rinse and pat dry prior to dressing wounds Primary Wound Dressing: Wound #4 Right,Lateral Foot: Silver Alginate Secondary Dressing: Wound #4 Right,Lateral Foot: Gauze and Kerlix/Conform Dressing Change Frequency: Wound #4 Right,Lateral Foot: Change dressing every day. Edema Control: Wound #4 Right,Lateral Foot: Patient to wear  own compression stockings - wear daily Off-Loading: Wound #4 Right,Lateral Foot: Open toe surgical shoe with peg assist. The following medication(s) was prescribed: Augmentin oral 875 mg-125 mg tablet 1 tablet oral bid for 10 days (may d/c Levaquin) for wound infection starting 05/15/2019 #1 continue with silver alginate. He has a surgical sandal 2. With regards to the  MSSA and Enterococcus. Augmentin would be the best drug here I am going to give him 875 p.o. twice daily for 10 days. The Levaquin can discontinue. The coverage of Enterococcus and quinolones is unpredictable Electronic Signature(s) Signed: 05/15/2019 8:35:11 AM By: Linton Ham MD Entered By: Linton Ham on 05/15/2019 08:35:11 ADREIAN, TACHE ([id number]) MYCHEAL, MEHARG ([id number]) -------------------------------------------------------------------------------- SuperBill Details Patient Name: [name] Date of Service: 05/15/2019 Medical Record Number: [id number] Patient Account Number: [id number] Date of Birth/Sex: April 24, 1965 (54 y.o. M) Treating RN: [name] Primary Care Provider: [name] Other Clinician: Referring Provider: [name] Treating Provider/Extender: Tito Dine in Treatment: 9 Diagnosis Coding ICD-10 Codes Code Description E11.621 Type 2 diabetes mellitus with foot ulcer L97.512 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d L03.115 Cellulitis of right lower limb L84 Corns and callosities I10 Essential (primary) hypertension Z89.422 Acquired absence of other left toe(s) Facility Procedures CPT4 Code: [id number] Description: 99213 - WOUND CARE VISIT-LEV 3 EST PT Modifier: Quantity: 1 Physician Procedures CPT4 Code: [id number] Description: 99213 - WC PHYS LEVEL 3 - EST PT Modifier: Quantity: 1 CPT4 Code: Description: ICD-10 Diagnosis Description E11.621 Type 2 diabetes mellitus with foot ulcer L03.115 Cellulitis of right lower limb L97.512 Non-pressure chronic ulcer of other part of right foot with fat layer e Modifier: xposed Quantity: Electronic Signature(s) Signed: 05/15/2019 5:28:47 PM By: Linton Ham MD Entered By: Linton Ham on 05/15/2019 08:35:40

## 2019-05-16 NOTE — Progress Notes (Signed)
[name], [name] ([id number]) Visit Report for 05/15/2019 Arrival Information Details Patient Name: [name], [name]. Date of Service: 05/15/2019 8:00 AM Medical Record Number: [id number] Patient Account Number: [id number] Date of Birth/Sex: [birth year] (54 y.o. M) Treating RN: [name] Primary Care [name]: [name] Other Clinician: Referring [name]: [name] Treating [name]/Extender: [name] in Treatment: 9 Visit Information History Since Last Visit Added or deleted any medications: No Patient Arrived: Ambulatory Any new allergies or adverse reactions: No Arrival Time: 08:09 Had a fall or experienced change in No Accompanied By: self activities of daily living that may affect Transfer Assistance: None risk of falls: Patient Identification Verified: Yes Signs or symptoms of abuse/neglect since last visito No Secondary Verification Process Completed: Yes Hospitalized since last visit: No Patient Has Alerts: Yes Implantable device outside of the clinic excluding No Patient Alerts: DMII cellular tissue based products placed in the center since last visit: Has Dressing in Place as Prescribed: Yes Pain Present Now: No Electronic Signature(s) Signed: 05/15/2019 5:03:46 PM By: [name] Entered By: [name] on 05/15/2019 08:10:05 [name] ([id number]) -------------------------------------------------------------------------------- Clinic Level of Care Assessment Details Patient Name: [name] Date of Service: 05/15/2019 8:00 AM Medical Record Number: [id number] Patient Account Number: [id number] Date of Birth/Sex: 03/21/1965 (54 y.o. M) Treating RN: [name] Primary Care [name]: [name] Other Clinician: Referring [name]: [name] Treating [name]/Extender: [name] in Treatment: 9 Clinic Level of Care Assessment Items TOOL 4 Quantity Score []  - Use when  only an EandM is performed on FOLLOW-UP visit 0 ASSESSMENTS - Nursing Assessment / Reassessment X - Reassessment of Co-morbidities (includes updates in patient status) 1 10 X- 1 5 Reassessment of Adherence to Treatment Plan ASSESSMENTS - Wound and Skin Assessment / Reassessment X - Simple Wound Assessment / Reassessment - one wound 1 5 []  - 0 Complex Wound Assessment / Reassessment - multiple wounds []  - 0 Dermatologic / Skin Assessment (not related to wound area) ASSESSMENTS - Focused Assessment []  - Circumferential Edema Measurements - multi extremities 0 []  - 0 Nutritional Assessment / Counseling / Intervention []  - 0 Lower Extremity Assessment (monofilament, tuning fork, pulses) []  - 0 Peripheral Arterial Disease Assessment (using hand held doppler) ASSESSMENTS - Ostomy and/or Continence Assessment and Care []  - Incontinence Assessment and Management 0 []  - 0 Ostomy Care Assessment and Management (repouching, etc.) PROCESS - Coordination of Care X - Simple Patient / Family Education for ongoing care 1 15 []  - 0 Complex (extensive) Patient / Family Education for ongoing care []  - 0 Staff obtains Programmer, systems, Records, Test Results / Process Orders []  - 0 Staff telephones HHA, Nursing Homes / Clarify orders / etc []  - 0 Routine Transfer to another Facility (non-emergent condition) []  - 0 Routine Hospital Admission (non-emergent condition) []  - 0 New Admissions / Biomedical engineer / Ordering NPWT, Apligraf, etc. []  - 0 Emergency Hospital Admission (emergent condition) X- 1 10 Simple Discharge Coordination []  - 0 Complex (extensive) Discharge Coordination PROCESS - Special Needs []  - Pediatric / Minor Patient Management 0 []  - 0 Isolation Patient Management []  - 0 Hearing / Language / Visual special needs []  - 0 Assessment of Community assistance (transportation, D/C planning, etc.) []  - 0 Additional assistance / Altered mentation []  - 0 Support Surface(s)  Assessment (bed, cushion, seat, etc.) INTERVENTIONS - Wound Cleansing / Measurement [name], HOLME. ([id number]) X- 1 5 Simple Wound Cleansing - one wound []  - 0 Complex Wound Cleansing - multiple  wounds X- 1 5 Wound Imaging (photographs - any number of wounds) []  - 0 Wound Tracing (instead of photographs) X- 1 5 Simple Wound Measurement - one wound []  - 0 Complex Wound Measurement - multiple wounds INTERVENTIONS - Wound Dressings []  - Small Wound Dressing one or multiple wounds 0 X- 1 15 Medium Wound Dressing one or multiple wounds []  - 0 Large Wound Dressing one or multiple wounds []  - 0 Application of Medications - topical []  - 0 Application of Medications - injection INTERVENTIONS - Miscellaneous []  - External ear exam 0 []  - 0 Specimen Collection (cultures, biopsies, blood, body fluids, etc.) []  - 0 Specimen(s) / Culture(s) sent or taken to Lab for analysis []  - 0 Patient Transfer (multiple staff / Civil Service fast streamer / Similar devices) []  - 0 Simple Staple / Suture removal (25 or less) []  - 0 Complex Staple / Suture removal (26 or more) []  - 0 Hypo / Hyperglycemic Management (close monitor of Blood Glucose) []  - 0 Ankle / Brachial Index (ABI) - do not check if billed separately X- 1 5 Vital Signs Has the patient been seen at the hospital within the last three years: Yes Total Score: 80 Level Of Care: New/Established - Level 3 Electronic Signature(s) Signed: 05/15/2019 11:19:24 AM By: [name] Entered By: [name] on 05/15/2019 08:23:44 [name] ([id number]) -------------------------------------------------------------------------------- Encounter Discharge Information Details Patient Name: [name] Date of Service: 05/15/2019 8:00 AM Medical Record Number: [id number] Patient Account Number: [id number] Date of Birth/Sex: 12/10/1965 (54 y.o. M) Treating RN: [name] Primary Care [name]: [name] Other  Clinician: Referring [name]: [name] Treating [name]/Extender: [name] in Treatment: 9 Encounter Discharge Information Items Discharge Condition: Stable Ambulatory Status: Ambulatory Discharge Destination: Home Transportation: Private Auto Accompanied By: self Schedule Follow-up Appointment: Yes Clinical Summary of Care: Electronic Signature(s) Signed: 05/15/2019 11:19:24 AM By: [name] Entered By: [name] on 05/15/2019 08:24:23 [name] ([id number]) -------------------------------------------------------------------------------- Lower Extremity Assessment Details Patient Name: [name] Date of Service: 05/15/2019 8:00 AM Medical Record Number: [id number] Patient Account Number: [id number] Date of Birth/Sex: 1965-03-05 (54 y.o. M) Treating RN: [name] Primary Care Cassandre Oleksy: [name] Other Clinician: Referring Aloysius Heinle: [name] Treating Valentino Saavedra/Extender: Ricard Dillon Weeks in Treatment: 9 Edema Assessment Assessed: [Left: No] [Right: No] Edema: [Left: N] [Right: o] Vascular Assessment Pulses: Dorsalis Pedis Palpable: [Right:Yes] Electronic Signature(s) Signed: 05/15/2019 5:03:46 PM By: [name] Entered By: [name] on 05/15/2019 08:14:47 [name] ([id number]) -------------------------------------------------------------------------------- Multi Wound Chart Details Patient Name: [name] Date of Service: 05/15/2019 8:00 AM Medical Record Number: [id number] Patient Account Number: [id number] Date of Birth/Sex: 01/14/1965 (54 y.o. M) Treating RN: [name] Primary Care Alaura Schippers: [name] Other Clinician: Referring Teya Otterson: [name] Treating Isabella Ida/Extender: [name] in Treatment: 9 Vital Signs Height(in): 74 Pulse(bpm): 85 Weight(lbs): 308 Blood Pressure(mmHg): 149/68 Body Mass Index(BMI):  40 Temperature(F): 98.1 Respiratory Rate(breaths/min): 16 Photos: [N/A:N/A] Wound Location: Right, Lateral Foot N/A N/A Wounding Event: Gradually Appeared N/A N/A Primary Etiology: Diabetic Wound/Ulcer of the Lower N/A N/A Extremity Comorbid History: Hypertension, Type II Diabetes, N/A N/A Neuropathy Date Acquired: 03/12/2019 N/A N/A Weeks of Treatment: 9 N/A N/A Wound Status: Open N/A N/A Pending Amputation on Yes N/A N/A Presentation: Measurements L x W x D (cm) 0.5x0.5x0.3 N/A N/A Area (cm) : 0.196 N/A N/A Volume (cm) : 0.059 N/A N/A % Reduction in Area: 69.20% N/A N/A % Reduction in Volume: 53.50% N/A N/A Classification: Grade  1 N/A N/A Exudate Amount: Medium N/A N/A Exudate Type: Serous N/A N/A Exudate Color: amber N/A N/A Wound Margin: Flat and Intact N/A N/A Granulation Amount: Large (67-100%) N/A N/A Granulation Quality: Pink N/A N/A Necrotic Amount: Small (1-33%) N/A N/A Exposed Structures: Fat Layer (Subcutaneous Tissue) N/A N/A Exposed: Yes Fascia: No Tendon: No Muscle: No Joint: No Bone: No Epithelialization: None N/A N/A Treatment Notes Wound #4 (Right, Lateral Foot) Notes scell, gauze, conform [name], [name] ([id number]) Electronic Signature(s) Signed: 05/15/2019 5:28:47 PM By: Linton Ham MD Entered By: Linton Ham on 05/15/2019 08:26:46 [name] ([id number]) -------------------------------------------------------------------------------- Multi-Disciplinary Care Plan Details Patient Name: [name], STANBERRY. Date of Service: 05/15/2019 8:00 AM Medical Record Number: [id number] Patient Account Number: [id number] Date of Birth/Sex: 09-28-1965 (54 y.o. M) Treating RN: [name] Primary Care Trana Ressler: [name] Other Clinician: Referring Wonder Donaway: [name] Treating Isa Hitz/Extender: [name] in Treatment: 9 Active Inactive Nutrition Nursing Diagnoses: Impaired glucose control: actual or  potential Goals: Patient/caregiver verbalizes understanding of need to maintain therapeutic glucose control per primary care physician Date Initiated: 03/13/2019 Target Resolution Date: 04/11/2019 Goal Status: Active Interventions: Assess patient nutrition upon admission and as needed per policy Notes: Orientation to the Wound Care Program Nursing Diagnoses: Knowledge deficit related to the wound healing center program Goals: Patient/caregiver will verbalize understanding of the [place] Date Initiated: 03/13/2019 Target Resolution Date: 04/11/2019 Goal Status: Active Interventions: Provide education on orientation to the wound center Notes: Wound/Skin Impairment Nursing Diagnoses: Impaired tissue integrity Goals: Ulcer/skin breakdown will have a volume reduction of 30% by week 4 Date Initiated: 03/13/2019 Target Resolution Date: 04/11/2019 Goal Status: Active Interventions: Assess ulceration(s) every visit Notes: Electronic Signature(s) Signed: 05/15/2019 11:19:24 AM By: [name] Entered By: [name] on 05/15/2019 08:20:55 [name] ([id number]) -------------------------------------------------------------------------------- Pain Assessment Details Patient Name: [name] Date of Service: 05/15/2019 8:00 AM Medical Record Number: [id number] Patient Account Number: [id number] Date of Birth/Sex: 08/31/1965 (54 y.o. M) Treating RN: [name] Primary Care Ardyth Kelso: [name] Other Clinician: Referring Melville Engen: [name] Treating Abbey Veith/Extender: [name] in Treatment: 9 Active Problems Location of Pain Severity and Description of Pain Patient Has Paino No Site Locations Pain Management and Medication Current Pain Management: Electronic Signature(s) Signed: 05/15/2019 5:03:46 PM By: [name] Entered By: [name] on 05/15/2019 08:11:04 [name]  ([id number]) -------------------------------------------------------------------------------- Patient/Caregiver Education Details Patient Name: [name] Date of Service: 05/15/2019 8:00 AM Medical Record Number: [id number] Patient Account Number: [id number] Date of Birth/Gender: 1965-11-07 (54 y.o. M) Treating RN: [name] Primary Care Physician: [name] Other Clinician: Referring Physician: [name] Treating Physician/Extender: [name] in Treatment: 9 Education Assessment Education Provided To: Patient Education Topics Provided Wound/Skin Impairment: Handouts: Caring for Your Ulcer Methods: Demonstration, Explain/Verbal Responses: State content correctly Electronic Signature(s) Signed: 05/15/2019 11:19:24 AM By: [name] Entered By: [name] on 05/15/2019 08:23:57 [name] ([id number]) -------------------------------------------------------------------------------- Wound Assessment Details Patient Name: [name] Date of Service: 05/15/2019 8:00 AM Medical Record Number: [id number] Patient Account Number: [id number] Date of Birth/Sex: 1965/06/06 (54 y.o. M) Treating RN: [name] Primary Care Zeya Balles: [name] Other Clinician: Referring Pradeep Beaubrun: [name] Treating Bristol Osentoski/Extender: [name] in Treatment: 9 Wound Status Wound Number: 4 Primary Etiology: Diabetic Wound/Ulcer of the Lower Extremity Wound Location: Right, Lateral Foot Wound Status: Open Wounding Event: Gradually Appeared Comorbid History: Hypertension, Type II Diabetes, Neuropathy Date Acquired: 03/12/2019 Weeks Of Treatment: 9 Clustered Wound:  No Pending Amputation On Presentation Photos Wound Measurements Length: (cm) 0.5 % Redu Width: (cm) 0.5 % Redu Depth: (cm) 0.3 Epithe Area: (cm) 0.196 [place]ne Volume: (cm) 0.059 Under ction in Area: 69.2% ction in Volume: 53.5% lialization:  None ling: No mining: No Wound Description Classification: Grade 1 Foul O Wound Margin: Flat and Intact Slough Exudate Amount: Medium Exudate Type: Serous Exudate Color: amber dor After Cleansing: No /Fibrino Yes Wound Bed Granulation Amount: Large (67-100%) Exposed Structure Granulation Quality: Pink Fascia Exposed: No Necrotic Amount: Small (1-33%) Fat Layer (Subcutaneous Tissue) Exposed: Yes Necrotic Quality: Adherent Slough Tendon Exposed: No Muscle Exposed: No Joint Exposed: No Bone Exposed: No Treatment Notes Wound #4 (Right, Lateral Foot) Notes scell, gauze, conform [name], [name] ([id number]) Electronic Signature(s) Signed: 05/15/2019 5:03:46 PM By: [name] Entered By: [name] on 05/15/2019 08:14:26 [name] ([id number]) -------------------------------------------------------------------------------- Vitals Details Patient Name: [name] Date of Service: 05/15/2019 8:00 AM Medical Record Number: [id number] Patient Account Number: [id number] Date of Birth/Sex: 1965/01/15 (54 y.o. M) Treating RN: [name] Primary Care Deondria Puryear: [name] Other Clinician: Referring Giovoni Bunch: [name] Treating Alyssandra Hulsebus/Extender: [name] in Treatment: 9 Vital Signs Time Taken: 08:10 Temperature (F): 98.1 Height (in): 74 Pulse (bpm): 85 Weight (lbs): 308 Respiratory Rate (breaths/min): 16 Body Mass Index (BMI): 39.5 Blood Pressure (mmHg): 149/68 Reference Range: 80 - 120 mg / dl Electronic Signature(s) Signed: 05/15/2019 5:03:46 PM By: [name] Entered By: [name] on 05/15/2019 08:10:58

## 2019-05-21 ENCOUNTER — Telehealth: Payer: Self-pay | Admitting: Neurology

## 2019-05-21 NOTE — Addendum Note (Signed)
Addended by: Larey Seat on: 05/21/2019 08:29 AM   Modules accepted: Orders

## 2019-05-21 NOTE — Telephone Encounter (Signed)
-----   Message from Larey Seat, MD sent at 05/21/2019  8:29 AM EDT ----- Summary & Diagnosis:     Overall moderately- severe Obstructive Sleep Apnea (AHI of  18.8/h) was exacerbated in REM sleep to an AHI of 38/h.  Intermittent , brief oxygen desaturation episodes were clustered  in REM sleep. All sleep was recorded in supine.  Recommendations:    REM dependent sleep apnea is unresponsive to dental devices and  Inspire technology and would require positive airway pressure  therapy.  I will order an autotitration device with heated humidification,  setting of 6 through 18 cm water, 2 cm EPR and mask to be fitted.    Interpreting Physician: Larey Seat, MD

## 2019-05-21 NOTE — Telephone Encounter (Signed)
Pt returned call. I advised pt that Dr. Brett Fairy reviewed their sleep study results and found that pt moderate to severe sleep apnea. Dr. Brett Fairy recommends that pt starts auto CPAP. I reviewed PAP compliance expectations with the pt. Pt is agreeable to starting a CPAP. I advised pt that an order will be sent to a DME, [facility], and [facility] will call the pt within about one week after they file with the pt's insurance. [facility] will show the pt how to use the machine, fit for masks, and troubleshoot the CPAP if needed. A follow up appt was made for insurance purposes with Dr Brett Fairy on July 29,2021 at 3:30 pm. Pt verbalized understanding to arrive 15 minutes early and bring their CPAP. A letter with all of this information in it will be mailed to the pt as a reminder. I verified with the pt that the address we have on file is correct. Pt verbalized understanding of results. Pt had no questions at this time but was encouraged to call back if questions arise. I have sent the order to [facility] and have received confirmation that they have received the order.

## 2019-05-21 NOTE — Procedures (Signed)
  Patient Information     First Name: [name] Last Name: [name] ID: [id number]  Birth Date: 06-13-1965 Age: 55 Gender: Male  Referring Provider: [name], MD BMI: 39.6 (W=308 lb, H=6' 2'')  Neck Circ.:  19 '' Epworth:  5/24   Sleep Study Information    Study Date: May 14, 2019 S/H/A Version: 001.001.001.001 / 4.1.1528 / 32  History:    [name] is a handed Caucasian male patient with a medical history of Broken ankle, Broken arm, Diabetes mellitus without complication ([place]), History of Bell's palsy, Hyperlipidemia, Hypertension, Morbid obesity, and Osteomyelitis ([place]). DM was diagnosed at age 41. He snores and wakes sometimes gasping for air, is at high risk for OSA.         Summary & Diagnosis:      Overall moderately- severe Obstructive Sleep Apnea (AHI of 18.8/h) was exacerbated in REM sleep to an AHI of 38/h. Intermittent , brief oxygen desaturation episodes were clustered in REM sleep. All sleep was recorded in supine.  Recommendations:     REM dependent sleep apnea is unresponsive to dental devices and Inspire technology and would require positive airway pressure therapy.  I will order an autotitration device with heated humidification, setting of 6 through 18 cm water, 2 cm EPR and mask to be fitted.   Interpreting Physician: Larey Seat, MD            Sleep Summary  Oxygen Saturation Statistics   Start Study Time: End Study Time: Total Recording Time:       10:09:23 PM 5:15:03 AM      7 h, 5 min  Total Sleep Time % REM of Sleep Time:  5 h, 59 min 21.1    Mean: 94 Minimum: 81 Maximum: 98  Mean of Desaturations Nadirs (%):   89  Oxygen Desaturation. %:   4-9 10-20 >20 Total  Events Number Total    52  17 75.4 24.6  0 0.0  69 100.0  Oxygen Saturation: <90 <=88 <85 <80 <70  Duration (minutes): Sleep % 10.6 3.0  7.7 1.2  2.1 0.3 0.0 0.0 0.0 0.0     Respiratory Indices      Total Events REM NREM All Night  pRDI:  115  pAHI:   112 ODI:  69  pAHIc:  0  % CSR: 0.0 38.3 38.3 27.9 0.0 14.3 13.6 7.2 0.0 19.3 18.8 11.6 0.0       Pulse Rate Statistics during Sleep (BPM)      Mean: 69 Minimum: N/A Maximum: 93    Indices are calculated using technically valid sleep time of 5 h, 57 min. Central-Indices are calculated using technically valid sleep time of 5 h, 44 min. pRDI/pAHI are calculated using 02 desaturations ? 3%  Body Position Statistics  Position Supine Prone Right Left Non-Supine  Sleep (min) 357.7 0.0 0.0 0.0 0.0  Sleep % 99.4 0.0 0.0 0.0 0.0  pRDI 19.3 N/A N/A N/A N/A  pAHI 18.9 N/A N/A N/A N/A  ODI 11.7 N/A N/A N/A N/A     Snoring Statistics Snoring Level (dB) >40 >50 >60 >70 >80 >Threshold (45)  Sleep (min) 44.4 1.7 0.3 0.0 0.0 6.9  Sleep % 12.3 0.5 0.1 0.0 0.0 1.9    Mean: 40 dB

## 2019-05-21 NOTE — Telephone Encounter (Signed)
Called patient to discuss sleep study results. No answer at this time. LVM for the patient to call back.   

## 2019-05-21 NOTE — Progress Notes (Signed)
Summary & Diagnosis:     Overall moderately- severe Obstructive Sleep Apnea (AHI of  18.8/h) was exacerbated in REM sleep to an AHI of 38/h.  Intermittent , brief oxygen desaturation episodes were clustered  in REM sleep. All sleep was recorded in supine.  Recommendations:    REM dependent sleep apnea is unresponsive to dental devices and  Inspire technology and would require positive airway pressure  therapy.  I will order an autotitration device with heated humidification,  setting of 6 through 18 cm water, 2 cm EPR and mask to be fitted.    Interpreting Physician: Larey Seat, MD

## 2019-05-22 ENCOUNTER — Other Ambulatory Visit: Payer: Self-pay

## 2019-05-22 ENCOUNTER — Encounter: Payer: Managed Care, Other (non HMO) | Admitting: Internal Medicine

## 2019-05-22 DIAGNOSIS — E11621 Type 2 diabetes mellitus with foot ulcer: Secondary | ICD-10-CM | POA: Diagnosis not present

## 2019-05-23 NOTE — Progress Notes (Signed)
[name], [name] ([id number]) Visit Report for 05/22/2019 Arrival Information Details Patient Name: [name], [name]. Date of Service: 05/22/2019 8:00 AM Medical Record Number: [id number] Patient Account Number: [id number] Date of Birth/Sex: 07/20/1965 (54 y.o. M) Treating RN: Montey Hora Primary Care Betti Goodenow: [name] Other Clinician: Referring Malaysha Arlen: [name] Treating Tawsha Terrero/Extender: Tito Dine in Treatment: 10 Visit Information History Since Last Visit Added or deleted any medications: No Patient Arrived: Ambulatory Any new allergies or adverse reactions: No Arrival Time: 08:09 Had a fall or experienced change in No Accompanied By: self activities of daily living that may affect Transfer Assistance: None risk of falls: Patient Identification Verified: Yes Signs or symptoms of abuse/neglect since last visito No Secondary Verification Process Completed: Yes Hospitalized since last visit: No Patient Has Alerts: Yes Implantable device outside of the clinic excluding No Patient Alerts: DMII cellular tissue based products placed in the center since last visit: Has Dressing in Place as Prescribed: Yes Pain Present Now: No Electronic Signature(s) Signed: 05/22/2019 5:37:23 PM By: Montey Hora Entered By: Montey Hora on 05/22/2019 08:10:23 [name] ([id number]) -------------------------------------------------------------------------------- Encounter Discharge Information Details Patient Name: [name] Date of Service: 05/22/2019 8:00 AM Medical Record Number: [id number] Patient Account Number: [id number] Date of Birth/Sex: Jan 05, 1965 (54 y.o. M) Treating RN: [name] Primary Care Jaionna Weisse: [name] Other Clinician: Referring Deya Bigos: [name] Treating Alois Mincer/Extender: Tito Dine in Treatment: 10 Encounter Discharge Information Items Post Procedure Vitals Discharge  Condition: Stable Temperature (F): 98.4 Ambulatory Status: Ambulatory Pulse (bpm): 79 Discharge Destination: Home Respiratory Rate (breaths/min): 16 Transportation: Private Auto Blood Pressure (mmHg): 146/63 Accompanied By: self Schedule Follow-up Appointment: Yes Clinical Summary of Care: Electronic Signature(s) Signed: 05/22/2019 9:50:05 AM By: [name] Entered By: [name] on 05/22/2019 08:28:46 [name] ([id number]) -------------------------------------------------------------------------------- Lower Extremity Assessment Details Patient Name: [name] Date of Service: 05/22/2019 8:00 AM Medical Record Number: [id number] Patient Account Number: [id number] Date of Birth/Sex: Apr 23, 1965 (54 y.o. M) Treating RN: Montey Hora Primary Care Hyder Deman: [name] Other Clinician: Referring Hollyanne Schloesser: [name] Treating Shakia Sebastiano/Extender: Ricard Dillon Weeks in Treatment: 10 Edema Assessment Assessed: [Left: No] [Right: No] Edema: [Left: N] [Right: o] Vascular Assessment Pulses: Dorsalis Pedis Palpable: [Right:Yes] Electronic Signature(s) Signed: 05/22/2019 5:37:23 PM By: Montey Hora Entered By: Montey Hora on 05/22/2019 08:14:46 [name] ([id number]) -------------------------------------------------------------------------------- Multi Wound Chart Details Patient Name: [name] Date of Service: 05/22/2019 8:00 AM Medical Record Number: [id number] Patient Account Number: [id number] Date of Birth/Sex: Feb 10, 1965 (54 y.o. M) Treating RN: [name] Primary Care Derak Schurman: [name] Other Clinician: Referring Kenndra Morris: [name] Treating Raeanne Deschler/Extender: Tito Dine in Treatment: 10 Vital Signs Height(in): 74 Pulse(bpm): 30 Weight(lbs): 308 Blood Pressure(mmHg): 146/63 Body Mass Index(BMI): 40 Temperature(F): 98.4 Respiratory Rate(breaths/min): 16 Photos:  [N/A:N/A] Wound Location: Right, Lateral Foot N/A N/A Wounding Event: Gradually Appeared N/A N/A Primary Etiology: Diabetic Wound/Ulcer of the Lower N/A N/A Extremity Comorbid History: Hypertension, Type II Diabetes, N/A N/A Neuropathy Date Acquired: 03/12/2019 N/A N/A Weeks of Treatment: 10 N/A N/A Wound Status: Open N/A N/A Pending Amputation on Yes N/A N/A Presentation: Measurements L x W x D (cm) 0.4x0.3x0.3 N/A N/A Area (cm) : 0.094 N/A N/A Volume (cm) : 0.028 N/A N/A % Reduction in Area: 85.20% N/A N/A % Reduction in Volume: 78.00% N/A N/A Classification: Grade 1 N/A N/A Exudate Amount: Medium N/A N/A Exudate Type: Serous N/A N/A Exudate Color: amber N/A N/A Wound Margin: Flat and Intact N/A N/A  Granulation Amount: Large (67-100%) N/A N/A Granulation Quality: Pink N/A N/A Necrotic Amount: Small (1-33%) N/A N/A Exposed Structures: Fat Layer (Subcutaneous Tissue) N/A N/A Exposed: Yes Fascia: No Tendon: No Muscle: No Joint: No Bone: No Epithelialization: None N/A N/A Debridement: Debridement - Excisional N/A N/A Pre-procedure Verification/Time 08:24 N/A N/A Out Taken: Pain Control: Lidocaine N/A N/A Tissue Debrided: Callus, Subcutaneous N/A N/A Level: Skin/Subcutaneous Tissue N/A N/A Debridement Area (sq cm): 0.12 N/A N/A Instrument: Curette N/A N/A Bleeding: None N/A N/A Debridement Treatment Procedure was tolerated well N/A N/A ResponseKAIMANI, [name] ([id number]) Post Debridement 0.6x0.6x1 N/A N/A Measurements L x W x D (cm) Post Debridement Volume: 0.283 N/A N/A (cm) Procedures Performed: Debridement N/A N/A Treatment Notes Wound #4 (Right, Lateral Foot) Notes prisma, gauze, conform Electronic Signature(s) Signed: 05/22/2019 5:46:06 PM By: Linton Ham MD Entered By: Linton Ham on 05/22/2019 08:30:30 [name] ([id number]) -------------------------------------------------------------------------------- [place] Details Patient Name: [name], [name]. Date of Service: 05/22/2019 8:00 AM Medical Record Number: [id number] Patient Account Number: [id number] Date of Birth/Sex: [birth year] (54 y.o. M) Treating RN: [name] Primary Care Cinque Begley: [name] Other Clinician: Referring Zeb Rawl: [name] Treating Houa Nie/Extender: Tito Dine in Treatment: 10 Active Inactive Nutrition Nursing Diagnoses: Impaired glucose control: actual or potential Goals: Patient/caregiver verbalizes understanding of need to maintain therapeutic glucose control per primary care physician Date Initiated: 03/13/2019 Target Resolution Date: 04/11/2019 Goal Status: Active Interventions: Assess patient nutrition upon admission and as needed per policy Notes: Orientation to the Wound Care Program Nursing Diagnoses: Knowledge deficit related to the wound healing center program Goals: Patient/caregiver will verbalize understanding of the [facility] Date Initiated: 03/13/2019 Target Resolution Date: 04/11/2019 Goal Status: Active Interventions: Provide education on orientation to the wound center Notes: Wound/Skin Impairment Nursing Diagnoses: Impaired tissue integrity Goals: Ulcer/skin breakdown will have a volume reduction of 30% by week 4 Date Initiated: 03/13/2019 Target Resolution Date: 04/11/2019 Goal Status: Active Interventions: Assess ulceration(s) every visit Notes: Electronic Signature(s) Signed: 05/22/2019 9:50:05 AM By: [name] Entered By: [name] on 05/22/2019 08:24:19 [name] ([id number]) -------------------------------------------------------------------------------- Pain Assessment Details Patient Name: [name] Date of Service: 05/22/2019 8:00 AM Medical Record Number: [id number] Patient Account Number: [id number] Date of Birth/Sex: 03/01/1965 (54 y.o. M) Treating RN: Montey Hora Primary Care Ayra Hodgdon:  [name] Other Clinician: Referring Devaney Segers: [name] Treating Edna Rede/Extender: Tito Dine in Treatment: 10 Active Problems Location of Pain Severity and Description of Pain Patient Has Paino No Site Locations Pain Management and Medication Current Pain Management: Electronic Signature(s) Signed: 05/22/2019 5:37:23 PM By: Montey Hora Entered By: Montey Hora on 05/22/2019 08:10:48 [name] ([id number]) -------------------------------------------------------------------------------- Patient/Caregiver Education Details Patient Name: [name] Date of Service: 05/22/2019 8:00 AM Medical Record Number: [id number] Patient Account Number: [id number] Date of Birth/Gender: 1965/06/01 (53 y.o. M) Treating RN: [name] Primary Care Physician: [name] Other Clinician: Referring Physician: [name] Treating Physician/Extender: Tito Dine in Treatment: 10 Education Assessment Education Provided To: Patient Education Topics Provided Wound/Skin Impairment: Handouts: Caring for Your Ulcer Methods: Demonstration, Explain/Verbal Responses: State content correctly Electronic Signature(s) Signed: 05/22/2019 9:50:05 AM By: [name] Entered By: [name] on 05/22/2019 08:28:07 [name] ([id number]) -------------------------------------------------------------------------------- Wound Assessment Details Patient Name: [name] Date of Service: 05/22/2019 8:00 AM Medical Record Number: [id number] Patient Account Number: [id number] Date of Birth/Sex: 02-Feb-1965 (54 y.o. M) Treating RN: [name] Primary Care Annely Sliva: [name] Other Clinician: Referring Ciena Sampley:  AVVA, RAVISANKAR Treating Chibuike Fleek/Extender: Ricard Dillon Weeks in Treatment: 10 Wound Status Wound Number: 4 Primary Etiology: Diabetic Wound/Ulcer of the Lower Extremity Wound Location: Right,  Lateral Foot Wound Status: Open Wounding Event: Gradually Appeared Comorbid History: Hypertension, Type II Diabetes, Neuropathy Date Acquired: 03/12/2019 Weeks Of Treatment: 10 Clustered Wound: No Pending Amputation On Presentation Photos Photo Uploaded By: Montey Hora on 05/22/2019 08:30:03 Wound Measurements Length: (cm) 0.6 % Redu Width: (cm) 0.6 % Redu Depth: (cm) 1 Epithe Area: (cm) 0.283 [place] Volume: (cm) 0.283 Under ction in Area: 55.5% ction in Volume: -122.8% lialization: None ling: No mining: No Wound Description Classification: Grade 1 Foul O Wound Margin: Flat and Intact Slough Exudate Amount: Medium Exudate Type: Serous Exudate Color: amber dor After Cleansing: No /Fibrino Yes Wound Bed Granulation Amount: Large (67-100%) Exposed Structure Granulation Quality: Pink Fascia Exposed: No Necrotic Amount: Small (1-33%) Fat Layer (Subcutaneous Tissue) Exposed: Yes Necrotic Quality: Adherent Slough Tendon Exposed: No Muscle Exposed: No Joint Exposed: No Bone Exposed: No Treatment Notes Wound #4 (Right, Lateral Foot) Notes prisma, gauze, conform [name], [name] ([id number]) Electronic Signature(s) Signed: 05/22/2019 9:50:05 AM By: [name] Entered By: [name] on 05/22/2019 08:30:43 [name] ([id number]) -------------------------------------------------------------------------------- Vitals Details Patient Name: [name] Date of Service: 05/22/2019 8:00 AM Medical Record Number: [id number] Patient Account Number: [id number] Date of Birth/Sex: 11-Aug-1965 (54 y.o. M) Treating RN: Montey Hora Primary Care Quintana Canelo: [name] Other Clinician: Referring Reeda Soohoo: [name] Treating Imanni Burdine/Extender: Tito Dine in Treatment: 10 Vital Signs Time Taken: 08:10 Temperature (F): 98.4 Height (in): 74 Pulse (bpm): 79 Weight (lbs): 308 Respiratory Rate (breaths/min): 16 Body Mass Index  (BMI): 39.5 Blood Pressure (mmHg): 146/63 Reference Range: 80 - 120 mg / dl Electronic Signature(s) Signed: 05/22/2019 5:37:23 PM By: Montey Hora Entered By: Montey Hora on 05/22/2019 08:10:40

## 2019-05-23 NOTE — Progress Notes (Signed)
[name], [name] ([id number]) Visit Report for 05/22/2019 Debridement Details Patient Name: [name], [name]. Date of Service: 05/22/2019 8:00 AM Medical Record Number: [id number] Patient Account Number: [id number] Date of Birth/Sex: [birth year] (54 y.o. M) Treating RN: [name] Primary Care Provider: [name] Other Clinician: Referring Provider: [name] Treating Provider/Extender: Tito Dine in Treatment: 10 Debridement Performed for Wound #4 Right,Lateral Foot Assessment: Performed By: Physician Ricard Dillon, MD Debridement Type: Debridement Severity of Tissue Pre Debridement: Fat layer exposed Level of Consciousness (Pre- Awake and Alert procedure): Pre-procedure Verification/Time Out Yes - 08:24 Taken: Start Time: 08:24 Pain Control: Lidocaine Total Area Debrided (L x W): 0.4 (cm) x 0.3 (cm) = 0.12 (cm) Tissue and other material Viable, Non-Viable, Callus, Subcutaneous debrided: Level: Skin/Subcutaneous Tissue Debridement Description: Excisional Instrument: Curette Bleeding: None End Time: 08:25 Response to Treatment: Procedure was tolerated well Level of Consciousness (Post- Awake and Alert procedure): Post Debridement Measurements of Total Wound Length: (cm) 0.6 Width: (cm) 0.6 Depth: (cm) 1 Volume: (cm) 0.283 Character of Wound/Ulcer Post Debridement: Stable Severity of Tissue Post Debridement: Fat layer exposed Post Procedure Diagnosis Same as Pre-procedure Electronic Signature(s) Signed: 05/22/2019 9:50:05 AM By: [name] Signed: 05/22/2019 5:46:06 PM By: Linton Ham MD Entered By: Linton Ham on 05/22/2019 08:30:45 [name] ([id number]) -------------------------------------------------------------------------------- HPI Details Patient Name: [name] Date of Service: 05/22/2019 8:00 AM Medical Record Number: [id number] Patient Account Number: [id number] Date of Birth/Sex:  February 10, 1965 (54 y.o. M) Treating RN: [name] Primary Care Provider: [name] Other Clinician: Referring Provider: [name] Treating Provider/Extender: Tito Dine in Treatment: 10 History of Present Illness HPI Description: This 53 year old male comes with an ulcerated area on the plantar aspect of the right foot which she's had for approximately a month. I have known him from a previous visit at [facility] wound center and was treated in the months of April and May 2016 and rapidly healed a left plantar ulcer with a total contact cast. He has been a diabetic for about 16 years and tries to keep active and is fairly compliant with his diabetes management. 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 Past medical history significant for hypertension, hyperlipidemia, and status post appendectomy 1993. He does not smoke or drink alcohol. 01/14/2015 -- the patient had tolerated his total contact cast very well and had no problems and has had no systemic symptoms. However when his total contact cast was cut open he had excessive amount of purulent drainage in spite of being on antibiotics. He had had a recent x-ray done in the ER 12/22/2014 which showed IMPRESSION:No evidence of osseous erosion. Known soft tissue ulceration is not well characterized on radiograph. Scattered vascular calcifications seen. his last hemoglobin A1c in December was 7.3. He has been on Augmentin and doxycycline for the last 2 weeks. 01/21/2015 -- his culture grew rare growth of Pantoea species an MR moderate growth of Candida parapsilosis. it is sensitive to levofloxacin. He has not heard back from the insurance company regarding his hyperbaric oxygen therapy. His MRI has not been done yet and we will try and get him an earlier date 01/28/2015 -- MRI was done last night -- IMPRESSION:1. Soft tissue ulcer overlying the plantar aspect of the fifth metatarsal head extending to the cortex.  Subcortical marrow edema in the fifth metatarsal head with corresponding T1 hypointensity is concerning for early osteomyelitis of the plantar lateral aspect of the fifth metatarsal head. Chest x-ray done on 01/14/2015 shows bronchiectatic changes  without infiltrate. EKG done on generally 17 2017 shows a normal sinus rhythm and is a normal EKG. 02/04/2015 -- he was asked to see Dr. [name] last week and had 2 appointments but had to cancel both due to pressures of work. Last night he has woken up with severe pain in the foot and leg and it is swollen up. No fever or no change in his blood glucose. Addendum: I spoke with Dr. [name] who kindly agreed to accept the patient for inpatient therapy and have also opened to the hospitalist Dr. [name], and discuss details of the management including PICC line and repeat cultures. 02/12/2015-- -- was seen by Dr. [name] in the hospital and a PICC line was placed. He was to receive Ceftazidime 2 g every 12 hourly, oral levofloxacin 750 mg every 24 hourly and oral fluconazole 200 mg daily. The antibiotics were to be given for 4 weeks except the Diflucan was to be given for the first 2 weeks. Reviewed note from 02/10/2015 -- and Dr. [name] had recommended management for growth of MSSA and Serratia. He switched him from ceftazidime to ceftriaxone 2 g every 24 hours. Levofloxacin was stopped and he would continue on fluconazole for another week. He had asked me to decide whether further imaging was necessary and whether surgical debridement of the infected bone was needed. He is doing well and has been off work for this week and we will keep him off the next week. 02/22/2015 -- he was seen by my colleague on 01/19/2015 and at that time an incision and drainage was done on his right lateral forefoot on the dorsum. Today when I probed this wound it is frankly draining pus and it communicates with the ulcer on the plantar aspect of his right foot. The  patient is still on IV ceftriaxone 2 g every 24 hours and is to be seen by Dr. [name] on Friday. 03/19/2015 -- On 03/04/2015 I spoke to Dr. 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 He recommended operative debridement which would probably result in the fifth metatarsal head and toe amputation.The patient would be referred back to [place] once he was done with surgery. He was admitted to [facility] yesterday and had surgery done by podiatry for a right fifth metatarsal acute osteomyelitis with cellulitis and abscess. He had a right foot incision and drainage with fifth metatarsal partial amputation and removal of toe infected bone and soft tissue with cultures. The wound was partially closed and packing of the distal end was done. Patient was already on cefepime 2 g IV every 8 hourly and put on vancomycin pending final cultures. He had grown moderate gram-negative rods, and later found to be rare diphtheroids. I received a call from Dr. Cannon Kettle the podiatrist and we discussed the above. On 03/10/2015 he was found positive for influenza a and has been put on Tamiflu. Since his discharge he has been seen by Dr. Cannon Kettle who is planning to remove his sutures this coming week. He was reviewed by Dr. [name] on 03/17/2015 and his Diflucan was stopped and vancomycin. After the 3 doses he is taking. He is going to change Ceftazidime to Zosyn 3.375 g IV every 8 hours. 03/25/2015 -- he was seen by the podiatrist a couple of days ago and the sutures were removed. She  will follow back with him in 4 weeks' time and at that time x-rays will be taken and a custom molded insert would be made for his shoe. 04/01/2015 -- he was seen by Dr. [name] on 03/29/2015 who pulled the PICC line stopped his IV antibiotics and recommended  starting doxycycline and levofloxacin for 2 weeks. He also stop the fluconazole. The patient will follow up with him only when necessary. Readmission: 03/15/17 on evaluation today patient presents for initial evaluation concerning the new issues although he has previously been evaluated in our clinic. Unfortunately the previous evaluation led to the patient having to proceed to amputation and so he is somewhat nervous about being here [name], [name] ([id number]) today. With that being said he has a very slight blister that occurred on the plantar aspect more medial on the right great toe that has been present for just a very short amount of time, several days. With that being said he felt like initially when he called that he somewhat overreacted but due to the fact that his previous issue led to amputation he is very cautious these days I explained that he did the right thing. With that being said he has been tolerating the dressing changes without complication mainly he just been covering this. He does not have any discomfort in secondary to neuropathy it's unlikely that he feels much. With that being said he does tell me that the issue that he had here is that he went barefoot when he knows he should not have which subsequently led to the blisters. He states is definitely not doing that anymore. His most recent hemoglobin A1c was December and registered 6.9 his ABI today was 1.1. 03/27/17 on evaluation today patient appears to be doing great in regard to his right great toe ulcer. He has been tolerating the dressing changes without complication. The good news is this is making excellent progress and he seems to be caring for this in an excellent fashion. I see no evidence of breakdown that would make me concerned that he was at risk for infection/amputation. This has obviously been his concern due to the fifth toe amputation that was necessitated previous when he had a similar issue.  Nonetheless this seems to be progressing much more nicely. Readmission: 03/13/2019 upon evaluation today patient presents for reevaluation here in the clinic concerning issues he has been having with his right foot on the lateral portion at the proximal end of the metatarsal. Subsequently he tells me that this just opened up in the past couple of days or so and the erythema really began about 24 to 48 hours ago. Fortunately there is no signs of systemic infection. 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 That left him with this bony prominence that is where the wound is currently. There is erythema surrounding that does have me concerned about cellulitis. With that being said he was noncompressible as far as ABIs are concerned I do think we can need to check into arterial studies at this point. Patient does have a history of hypertension along with diabetes mellitus type 2. He also tends to develop quite a bit of callus white often. 03/20/2019 upon evaluation today patient appears to be doing very well in regard to his wounds currently. He has been tolerating the dressing changes without complication. Fortunately there is no signs of infection. His ABIs were good at this point and registered at 1.07 on the left and  1.09 on the right. In regard to the x-ray this was negative as well for any signs of osteomyelitis. The patient also seems to be doing better in regard to the infection. He still has several days of the antibiotic left at this point best the Bactrim DS and he seems to be doing very well with this. Overall I am extremely pleased with the progress in 1 week's time he is going require some sharp debridement today however. 03/27/2019 upon evaluation today patient actually appears to be making some progress in regard to his wound. It is a little deeper but measuring smaller which is good news due to the fact that again were clear away some of the  necrotic tissue which is why the depth is increasing a little bit. Nonetheless after like were getting very close to being down at a good wound bed. Fortunately there is no signs of active infection at this time. There is a little bit of erythema immediately surrounding the wound that we do want to be very cognizant of and careful about. For that reason I am going to go ahead and extend his antibiotic today for an additional 10 days that is the Bactrim. 04/03/2019 upon evaluation today patient appears to be making some progress. I do feel like the Annitta Needs is doing a good job along with the alginate is being packed into the wound space behind. He has been tolerating the dressing changes without complication. Fortunately there is no signs of active infection at this time. No fevers, chills, nausea, vomiting, or diarrhea. 04/10/2019 upon evaluation today patient appears to be making progress. The wound is not quite as deep but he still does not have a great wound surface yet for working on this and the Santyl does seem to be cleaning things up. Fortunately there is no evidence of active infection at this time. No fevers, chills, nausea, vomiting, or diarrhea. 04/17/2019 upon evaluation today patient appears to be doing excellent at this time in regard to his foot ulcer. I do believe the Santyl with the alginate packed in behind has been of benefit for him and I am very pleased in this regard. With that being said I am feeling like the wound surface is improving quite a bit each time I see him still I do not believe we are at the point of stating that the wound bed is perfect but again were making progress. The patient is going require some sharp debridement today. 4/22; this is a patient who is using Santyl with calcium alginate. Apparently his wound dimensions have been improving. Patient is changing the dressing himself. He has a surgical shoe. He works in a sitting position therefore is not on his feet all  that much. There is undermining 05/01/2019 upon evaluation today patient appears to be doing a little better in regard to the size of his wound though he still has some depth. This does seem to be much cleaner has been using Santyl on the base of the wound followed by packing with silver alginate and behind. Nonetheless I do believe that based on what we are seeing we may need to switch to a collagen type dressing endoform may actually be excellent for him. 05/08/2019 upon evaluation today patient's wound actually appears to be showing better granulation tissue in the base of the wound. With that being said he does have some epiboly noted around the edges of the wound especially plantar and more distal. Subsequently this is can require sharp debridement today.  Fortunately there is no signs of active systemic infection though I do feel like there is some local cellulitis noted at this point. The patient also notes has had increased drainage. 5/13; wound volume quite a bit improved this week down to 3 mm. Patient states pain and drainage better. Culture from last week grew staph aureus [not MRSA] and this is sensitive to quinolones and he is on Levaquin. HOWEVER he also grew abundant Enterococcus faecalis treatment of choice for this is ampicillin. Quinolone coverage is unreliable 5/20 completing the Augmentin. Small wound on the right lateral foot. Thick rolled edges around the wound. Using silver alginate Electronic Signature(s) Signed: 05/22/2019 5:46:06 PM By: Linton Ham MD Entered By: Linton Ham on 05/22/2019 08:31:33 [name] ([id number]) -------------------------------------------------------------------------------- Physical Exam Details Patient Name: [name], [name] Date of Service: 05/22/2019 8:00 AM Medical Record Number: [id number] Patient Account Number: [id number] Date of Birth/Sex: October 05, 1965 (54 y.o. M) Treating RN: [name] Primary Care Provider: [name] Other Clinician: Referring Provider: [name] Treating Provider/Extender: Tito Dine in Treatment: 10 Constitutional Patient is hypertensive.. Pulse regular and within target range for patient.Marland Kitchen Respirations regular, non-labored and within target range.. Temperature is normal and within the target range for the patient.Marland Kitchen appears in no distress. Cardiovascular Fetal pulses are palpable. Integumentary (Hair, Skin) No surrounding erythema no purulent drainage. Notes Wound exam; right lateral foot. Unfortunately raised edges around the wound. I easily remove these with a #3 curette. Then when looking at the wound surface there was necrotic debris on this that easily fell apart giving a deeper wound to find viable tissue. This leads to more depth. There is not palpable bone and no surrounding erythema. Electronic Signature(s) Signed: 05/22/2019 5:46:06 PM By: Linton Ham MD Entered By: Linton Ham on 05/22/2019 08:33:47 [name] ([id number]) -------------------------------------------------------------------------------- Physician Orders Details Patient Name: [name], [name]. Date of Service: 05/22/2019 8:00 AM Medical Record Number: [id number] Patient Account Number: [id number] Date of Birth/Sex: December 22, 1965 (54 y.o. M) Treating RN: [name] Primary Care Provider: [name] Other Clinician: Referring Provider: [name] Treating Provider/Extender: Tito Dine in Treatment: 10 Verbal / Phone Orders: No Diagnosis Coding Wound Cleansing Wound #4 Right,Lateral Foot o Clean wound with Normal Saline. - in office o Dial antibacterial soap, wash wounds, rinse and pat dry prior to dressing wounds Primary Wound Dressing Wound #4 Right,Lateral Foot o Silver Collagen - moisten with normal saline Secondary Dressing Wound #4 Right,Lateral Foot o Gauze and Kerlix/Conform Dressing Change Frequency Wound  #4 Right,Lateral Foot o Change dressing every day. Edema Control Wound #4 Right,Lateral Foot o Patient to wear own compression stockings - wear daily Off-Loading Wound #4 Right,Lateral Foot o Open toe surgical shoe with peg assist. Electronic Signature(s) Signed: 05/22/2019 9:50:05 AM By: [name] Signed: 05/22/2019 5:46:06 PM By: Linton Ham MD Entered By: [name] on 05/22/2019 08:27:51 [name] ([id number]) -------------------------------------------------------------------------------- Problem List Details Patient Name: ZAC, SCHLICHER. Date of Service: 05/22/2019 8:00 AM Medical Record Number: [id number] Patient Account Number: [id number] Date of Birth/Sex: [birth year] (54 y.o. 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 M) Treating RN: [name] Primary Care  Provider: [name] Other Clinician: Referring Provider: [name] Treating Provider/Extender: Tito Dine in Treatment: 10 Subjective History of Present Illness (HPI) This 54 year old male comes with an ulcerated area on the plantar aspect of the right foot which she's had for approximately a month. I have known him from a previous visit at [facility] wound center and was treated in the months of April and May 2016 and rapidly healed a left plantar ulcer with a total contact cast. He has been a diabetic for about 16 years and tries to keep active and is fairly compliant with his diabetes management. 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 Past medical history significant for hypertension, hyperlipidemia, and status post appendectomy 1993. He does not smoke or drink alcohol. 01/14/2015 -- the patient had tolerated his total contact cast very well and had no problems and has had no systemic symptoms. However when his total contact cast was cut open he had excessive amount of purulent drainage in spite of being on antibiotics. He had had a recent x-ray done in the ER 12/22/2014 which showed IMPRESSION:No evidence of osseous erosion. Known soft tissue ulceration is not well characterized on radiograph. Scattered vascular calcifications seen. his last hemoglobin A1c in December was 7.3. He has been on Augmentin and doxycycline for the last 2 weeks. 01/21/2015 -- his culture grew rare growth of Pantoea species an MR moderate growth of Candida parapsilosis. it is sensitive to levofloxacin. He has not heard back from the insurance company regarding his hyperbaric oxygen therapy. His MRI has not been done yet and we will try and get him an earlier date 01/28/2015 -- MRI was done last night -- IMPRESSION:1. Soft tissue ulcer overlying the plantar aspect of the fifth metatarsal head extending to the cortex. Subcortical marrow edema in the fifth metatarsal head with  corresponding T1 hypointensity is concerning for early osteomyelitis of the plantar lateral aspect of the fifth metatarsal head. Chest x-ray done on 01/14/2015 shows bronchiectatic changes without infiltrate. EKG done on generally 17 2017 shows a normal sinus rhythm and is a normal EKG. 02/04/2015 -- he was asked to see Dr. [name] last week and had 2 appointments but had to cancel both due to pressures of work. Last night he has woken up with severe pain in the foot and leg and it is swollen up. No fever or no change in his blood glucose. Addendum: I spoke with Dr. [name] who kindly agreed to accept the patient for inpatient therapy and have also opened to the hospitalist Dr. [name], and discuss details of the management including PICC line and repeat cultures. 02/12/2015-- -- was seen by Dr. [name] in the hospital and a PICC line was placed. He was to receive Ceftazidime 2 g every 12 hourly, oral levofloxacin 750 mg every 24 hourly and oral fluconazole 200 mg daily. The antibiotics were to be given for 4 weeks except the Diflucan was to be given for the first 2 weeks. Reviewed note from 02/10/2015 -- and Dr. [name]  had recommended management for growth of MSSA and Serratia. He switched him from ceftazidime to ceftriaxone 2 g every 24 hours. Levofloxacin was stopped and he would continue on fluconazole for another week. He had asked me to decide whether further imaging was necessary and whether surgical debridement of the infected bone was needed. He is doing well and has been off work for this week and we will keep him off the next week. 02/22/2015 -- he was seen by my colleague on 01/19/2015 and at that time an incision and drainage was done on his right lateral forefoot on the dorsum. Today when I probed this wound it is frankly draining pus and it communicates with the ulcer on the plantar aspect of his right foot. The patient is still on IV ceftriaxone 2 g every 24 hours and is to  be seen by Dr. [name] on Friday. 03/19/2015 -- On 03/04/2015 I spoke to Dr. 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 He recommended operative debridement which would probably result in the fifth metatarsal head and toe amputation.The patient would be referred back to [place] once he was done with surgery. He was admitted to [facility] yesterday and had surgery done by podiatry for a right fifth metatarsal acute osteomyelitis with cellulitis and abscess. He had a right foot incision and drainage with fifth metatarsal partial amputation and removal of toe infected bone and soft tissue with cultures. The wound was partially closed and packing of the distal end was done. Patient was already on cefepime 2 g IV every 8 hourly and put on vancomycin pending final cultures. He had grown moderate gram-negative rods, and later found to be rare diphtheroids. I received a call from Dr. Cannon Kettle the podiatrist and we discussed the above. On 03/10/2015 he was found positive for influenza a and has been put on Tamiflu. Since his discharge he has been seen by Dr. Cannon Kettle who is planning to remove his sutures this coming week. He was reviewed by Dr. [name] on 03/17/2015 and his Diflucan was stopped and vancomycin. After the 3 doses he is taking. He is going to change Ceftazidime to Zosyn 3.375 g IV every 8 hours. 03/25/2015 -- he was seen by the podiatrist a couple of days ago and the sutures were removed. She will follow back with him in 4 weeks' time and at that time x-rays will be taken and a custom molded insert would be made for his shoe. 04/01/2015 -- he was seen by Dr. [name] on 03/29/2015 who pulled the PICC line stopped his IV antibiotics and recommended starting doxycycline and levofloxacin for 2 weeks. He also stop the fluconazole.  The patient will follow up with him only when necessary. Readmission: 03/15/17 on evaluation today patient presents for initial evaluation concerning the new issues although he has previously been evaluated in our clinic. Unfortunately the previous evaluation led to the patient having to proceed to amputation and so he is somewhat nervous about being here today. With that being said he has a very slight blister that occurred on the plantar aspect more medial on the right great toe that has been [name], [name]. ([id number]) present for just a very short amount of time, several days. With that being said he felt like initially when he called that he somewhat overreacted but due  to the fact that his previous issue led to amputation he is very cautious these days I explained that he did the right thing. With that being said he has been tolerating the dressing changes without complication mainly he just been covering this. He does not have any discomfort in secondary to neuropathy it's unlikely that he feels much. With that being said he does tell me that the issue that he had here is that he went barefoot when he knows he should not have which subsequently led to the blisters. He states is definitely not doing that anymore. His most recent hemoglobin A1c was December and registered 6.9 his ABI today was 1.1. 03/27/17 on evaluation today patient appears to be doing great in regard to his right great toe ulcer. He has been tolerating the dressing changes without complication. The good news is this is making excellent progress and he seems to be caring for this in an excellent fashion. I see no evidence of breakdown that would make me concerned that he was at risk for infection/amputation. This has obviously been his concern due to the fifth toe amputation that was necessitated previous when he had a similar issue. Nonetheless this seems to be progressing much more nicely. Readmission: 03/13/2019 upon  evaluation today patient presents for reevaluation here in the clinic concerning issues he has been having with his right foot on the lateral portion at the proximal end of the metatarsal. Subsequently he tells me that this just opened up in the past couple of days or so and the erythema really began about 24 to 48 hours ago. Fortunately there is no signs of systemic infection. 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 That left him with this bony prominence that is where the wound is currently. There is erythema surrounding that does have me concerned about cellulitis. With that being said he was noncompressible as far as ABIs are concerned I do think we can need to check into arterial studies at this point. Patient does have a history of hypertension along with diabetes mellitus type 2. He also tends to develop quite a bit of callus white often. 03/20/2019 upon evaluation today patient appears to be doing very well in regard to his wounds currently. He has been tolerating the dressing changes without complication. Fortunately there is no signs of infection. His ABIs were good at this point and registered at 1.07 on the left and 1.09 on the right. In regard to the x-ray this was negative as well for any signs of osteomyelitis. The patient also seems to be doing better in regard to the infection. He still has several days of the antibiotic left at this point best the Bactrim DS and he seems to be doing very well with this. Overall I am extremely pleased with the progress in 1 week's time he is going require some sharp debridement today however. 03/27/2019 upon evaluation today patient actually appears to be making some progress in regard to his wound. It is a little deeper but measuring smaller which is good news due to the fact that again were clear away some of the necrotic tissue which is why the depth is increasing a little bit. Nonetheless after like  were getting very close to being down at a good wound bed. Fortunately there is no signs of active infection at this time. There is a little bit of erythema immediately surrounding the wound that we do want to be  very cognizant of and careful about. For that reason I am going to go ahead and extend his antibiotic today for an additional 10 days that is the Bactrim. 04/03/2019 upon evaluation today patient appears to be making some progress. I do feel like the Annitta Needs is doing a good job along with the alginate is being packed into the wound space behind. He has been tolerating the dressing changes without complication. Fortunately there is no signs of active infection at this time. No fevers, chills, nausea, vomiting, or diarrhea. 04/10/2019 upon evaluation today patient appears to be making progress. The wound is not quite as deep but he still does not have a great wound surface yet for working on this and the Santyl does seem to be cleaning things up. Fortunately there is no evidence of active infection at this time. No fevers, chills, nausea, vomiting, or diarrhea. 04/17/2019 upon evaluation today patient appears to be doing excellent at this time in regard to his foot ulcer. I do believe the Santyl with the alginate packed in behind has been of benefit for him and I am very pleased in this regard. With that being said I am feeling like the wound surface is improving quite a bit each time I see him still I do not believe we are at the point of stating that the wound bed is perfect but again were making progress. The patient is going require some sharp debridement today. 4/22; this is a patient who is using Santyl with calcium alginate. Apparently his wound dimensions have been improving. Patient is changing the dressing himself. He has a surgical shoe. He works in a sitting position therefore is not on his feet all that much. There is undermining 05/01/2019 upon evaluation today patient appears to be  doing a little better in regard to the size of his wound though he still has some depth. This does seem to be much cleaner has been using Santyl on the base of the wound followed by packing with silver alginate and behind. Nonetheless I do believe that based on what we are seeing we may need to switch to a collagen type dressing endoform may actually be excellent for him. 05/08/2019 upon evaluation today patient's wound actually appears to be showing better granulation tissue in the base of the wound. With that being said he does have some epiboly noted around the edges of the wound especially plantar and more distal. Subsequently this is can require sharp debridement today. Fortunately there is no signs of active systemic infection though I do feel like there is some local cellulitis noted at this point. The patient also notes has had increased drainage. 5/13; wound volume quite a bit improved this week down to 3 mm. Patient states pain and drainage better. Culture from last week grew staph aureus [not MRSA] and this is sensitive to quinolones and he is on Levaquin. HOWEVER he also grew abundant Enterococcus faecalis treatment of choice for this is ampicillin. Quinolone coverage is unreliable 5/20 completing the Augmentin. Small wound on the right lateral foot. Thick rolled edges around the wound. Using silver alginate Objective Constitutional Patient is hypertensive.. Pulse regular and within target range for patient.Marland Kitchen Respirations regular, non-labored and within target range.. Temperature is normal and within the target range for the patient.Marland Kitchen appears in no distress. Vitals Time Taken: 8:10 AM, Height: 74 in, Weight: 308 lbs, BMI: 39.5, Temperature: 98.4 F, Pulse: 79 bpm, Respiratory Rate: 16 breaths/min, [name], [name] J. ([id number]) Blood Pressure: 146/63 mmHg. Cardiovascular Fetal  pulses are palpable. General Notes: Wound exam; right lateral foot. Unfortunately raised edges around the  wound. I easily remove these with a #3 curette. Then when looking at the wound surface there was necrotic debris on this that easily fell apart giving a deeper wound to find viable tissue. This leads to more depth. There is not palpable bone and no surrounding erythema. Integumentary (Hair, Skin) No surrounding erythema no purulent drainage. Wound #4 status is Open. Original cause of wound was Gradually Appeared. The wound is located on the Right,Lateral Foot. The wound measures 0.6cm length x 0.6cm width x 1cm depth; 0.283cm^2 area and 0.283cm^3 volume. There is Fat Layer (Subcutaneous Tissue) Exposed exposed. There is no tunneling or undermining noted. There is a medium amount of serous drainage noted. The wound margin is flat and intact. There is large (67-100%) pink granulation within the wound bed. There is a small (1-33%) amount of necrotic tissue within the wound bed including Adherent Slough. Assessment Active Problems ICD-10 Type 2 diabetes mellitus with foot ulcer 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d Cellulitis of right lower limb Corns and callosities Essential (primary) hypertension Acquired absence of other left toe(s) Procedures Wound #4 Pre-procedure diagnosis of Wound #4 is a Diabetic Wound/Ulcer of the Lower Extremity located on the Right,Lateral Foot .Severity of Tissue Pre Debridement is: Fat layer exposed. There was a Excisional Skin/Subcutaneous Tissue Debridement with a total area of 0.12 sq cm performed by Ricard Dillon, MD. With the following instrument(s): Curette to remove Viable and Non-Viable tissue/material. Material removed includes Callus and Subcutaneous Tissue and after achieving pain control using Lidocaine. A time out was conducted at 08:24, prior to the start of the procedure. There was no bleeding. The procedure was tolerated well. Post Debridement Measurements: 0.6cm length x 0.6cm width x 1cm  depth; 0.283cm^3 volume. Character of Wound/Ulcer Post Debridement is stable. Severity of Tissue Post Debridement is: Fat layer exposed. Post procedure Diagnosis Wound #4: Same as Pre-Procedure Plan Wound Cleansing: Wound #4 Right,Lateral Foot: Clean wound with Normal Saline. - in office Dial antibacterial soap, wash wounds, rinse and pat dry prior to dressing wounds Primary Wound Dressing: Wound #4 Right,Lateral Foot: Silver Collagen - moisten with normal saline Secondary Dressing: Wound #4 Right,Lateral Foot: Gauze and Kerlix/Conform Dressing Change Frequency: Wound #4 Right,Lateral Foot: Change dressing every day. Edema Control: Wound #4 Right,Lateral Foot: Patient to wear own compression stockings - wear daily [name], [name]. ([id number]) Off-Loading: Wound #4 Right,Lateral Foot: Open toe surgical shoe with peg assist. 1. Unfortunately the wound actually has more depth and was easily apparent. Surface was not viable. Debridement leads to more depth to find viable tissue 2. I change the primary dressing to moistened silver collagen 3. I think this is actually a weightbearing surface for him I wonder about a total contact cast 4. He is completing antibiotics. There is no palpable bone Electronic Signature(s) Signed: 05/22/2019 5:46:06 PM By: Linton Ham MD Entered By: Linton Ham on 05/22/2019 08:34:51 [name] ([id number]) -------------------------------------------------------------------------------- SuperBill Details Patient Name: [name] Date of Service: 05/22/2019 Medical Record Number: [id number] Patient Account Number: [id number] Date of Birth/Sex: Feb 16, 1965 (54 y.o. M) Treating RN: [name] Primary Care Provider: [name] Other Clinician: Referring Provider: [name] Treating Provider/Extender: Tito Dine in Treatment: 10 Diagnosis Coding ICD-10 Codes Code Description E11.621 Type 2  diabetes mellitus with foot ulcer L97.512 Non-pressure chronic ulcer of other part of right foot with fat layer  exposed L03.115 Cellulitis of right lower limb L84 Corns and callosities I10 Essential (primary) hypertension Z89.422 Acquired absence of other left toe(s) Facility Procedures CPT4 Code: [id number] Description: [id number] - DEB SUBQ TISSUE 20 SQ CM/< Modifier: Quantity: 1 CPT4 Code: Description: ICD-10 Diagnosis Description [id number] Non-pressure chronic ulcer of other part of right foot with fat layer ex Modifier: posed Quantity: Physician Procedures CPT4 [id number] Description: [id number] - WC PHYS SUBQ TISS 20 SQ CM Modifier: Quantity: 1 CPT4 Code: Description: ICD-10 Diagnosis Description L97.512 Non-pressure chronic ulcer of other part of right foot with fat layer ex Modifier: posed Quantity: Electronic Signature(s) Signed: 05/22/2019 5:46:06 PM By: Linton Ham MD Entered By: Linton Ham on 05/22/2019 08:35:07

## 2019-05-29 ENCOUNTER — Encounter: Payer: Managed Care, Other (non HMO) | Admitting: Physician Assistant

## 2019-05-29 ENCOUNTER — Other Ambulatory Visit: Payer: Self-pay

## 2019-05-29 DIAGNOSIS — E11621 Type 2 diabetes mellitus with foot ulcer: Secondary | ICD-10-CM | POA: Diagnosis not present

## 2019-05-29 NOTE — Progress Notes (Addendum)
JAYDON, PUSKARICH ([id number]) Visit Report for 05/29/2019 Chief Complaint Document Details Patient Name: [name], [name]. Date of Service: 05/29/2019 8:00 AM Medical Record Number: [id number] Patient Account Number: [id number] Date of Birth/Sex: 1965-09-01 (54 y.o. M) Treating RN: [name] Primary Care Provider: [name] Other Clinician: Referring Provider: [name] Treating Provider/Extender: Melburn Hake, Rion Schnitzer Weeks in Treatment: 11 Information Obtained from: Patient Chief Complaint Right lateral foot ulcer Electronic Signature(s) Signed: 05/29/2019 8:08:07 AM By: [name] PA-C Entered By: [name] on 05/29/2019 08:08:07 [name] ([id number]) -------------------------------------------------------------------------------- Debridement Details Patient Name: [name] Date of Service: 05/29/2019 8:00 AM Medical Record Number: [id number] Patient Account Number: [id number] Date of Birth/Sex: 07-22-1965 (54 y.o. M) Treating RN: [name] Primary Care Provider: [name] Other Clinician: Referring Provider: [name] Treating Provider/Extender: Melburn Hake, Antonina Deziel Weeks in Treatment: 11 Debridement Performed for Wound #4 Right,Lateral Foot Assessment: Performed By: Physician STONE III, Areta Terwilliger E., PA-C Debridement Type: Debridement Severity of Tissue Pre Debridement: Fat layer exposed Level of Consciousness (Pre- Awake and Alert procedure): Pre-procedure Verification/Time Out Yes - 08:23 Taken: Start Time: 08:24 Pain Control: Lidocaine Total Area Debrided (L x W): 0.5 (cm) x 0.4 (cm) = 0.2 (cm) Tissue and other material Viable, Non-Viable, Callus, Subcutaneous debrided: Level: Skin/Subcutaneous Tissue Debridement Description: Excisional Instrument: Curette Bleeding: None End Time: 08:25 Response to Treatment: Procedure was tolerated well Level of Consciousness (Post- Awake and Alert procedure): Post  Debridement Measurements of Total Wound Length: (cm) 0.5 Width: (cm) 0.4 Depth: (cm) 0.7 Volume: (cm) 0.11 Character of Wound/Ulcer Post Debridement: Stable Severity of Tissue Post Debridement: Fat layer exposed Post Procedure Diagnosis Same as Pre-procedure Electronic Signature(s) Signed: 05/29/2019 12:17:42 PM By: [name] Signed: 05/29/2019 12:49:03 PM By: [name] PA-C Entered By: [name] on 05/29/2019 08:25:16 [name] ([id number]) -------------------------------------------------------------------------------- HPI Details Patient Name: [name] Date of Service: 05/29/2019 8:00 AM Medical Record Number: [id number] Patient Account Number: [id number] Date of Birth/Sex: 29-Oct-1965 (54 y.o. M) Treating RN: [name] Primary Care Provider: [name] Other Clinician: Referring Provider: [name] Treating Provider/Extender: STONE III, Hollister Wessler Weeks in Treatment: 11 History of Present Illness HPI Description: This 54 year old male comes with an ulcerated area on the plantar aspect of the right foot which she's had for approximately a month. I have known him from a previous visit at [facility] wound center and was treated in the months of April and May 2016 and rapidly healed a left plantar ulcer with a total contact cast. He has been a diabetic for about 16 years and tries to keep active and is fairly compliant with his diabetes management. 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 Past medical history significant for hypertension, hyperlipidemia, and status post appendectomy 1993. He does not smoke or drink alcohol. 01/14/2015 -- the patient had tolerated his total contact cast very well and had no problems and has had no systemic symptoms. However when his total contact cast was cut open he had excessive amount of purulent drainage in spite of being on antibiotics. He had had a recent x-ray done in the ER 12/22/2014 which showed  IMPRESSION:No evidence of osseous erosion. Known soft tissue ulceration is not well characterized on radiograph. Scattered vascular calcifications seen. his last hemoglobin A1c in December was 7.3. He has been on Augmentin and doxycycline for the last 2 weeks. 01/21/2015 -- his culture grew rare growth of Pantoea species an MR moderate growth of Candida parapsilosis. it is sensitive to levofloxacin. He  has not heard back from the insurance company regarding his hyperbaric oxygen therapy. His MRI has not been done yet and we will try and get him an earlier date 01/28/2015 -- MRI was done last night -- IMPRESSION:1. Soft tissue ulcer overlying the plantar aspect of the fifth metatarsal head extending to the cortex. Subcortical marrow edema in the fifth metatarsal head with corresponding T1 hypointensity is concerning for early osteomyelitis of the plantar lateral aspect of the fifth metatarsal head. Chest x-ray done on 01/14/2015 shows bronchiectatic changes without infiltrate. EKG done on generally 17 2017 shows a normal sinus rhythm and is a normal EKG. 02/04/2015 -- he was asked to see Dr. [name] last week and had 2 appointments but had to cancel both due to pressures of work. Last night he has woken up with severe pain in the foot and leg and it is swollen up. No fever or no change in his blood glucose. Addendum: I spoke with Dr. [name] who kindly agreed to accept the patient for inpatient therapy and have also opened to the hospitalist Dr. [name], and discuss details of the management including PICC line and repeat cultures. 02/12/2015-- -- was seen by Dr. [name] in the hospital and a PICC line was placed. He was to receive Ceftazidime 2 g every 12 hourly, oral levofloxacin 750 mg every 24 hourly and oral fluconazole 200 mg daily. The antibiotics were to be given for 4 weeks except the Diflucan was to be given for the first 2 weeks. Reviewed note from 02/10/2015 -- and Dr. [name]  had recommended management for growth of MSSA and Serratia. He switched him from ceftazidime to ceftriaxone 2 g every 24 hours. Levofloxacin was stopped and he would continue on fluconazole for another week. He had asked me to decide whether further imaging was necessary and whether surgical debridement of the infected bone was needed. He is doing well and has been off work for this week and we will keep him off the next week. 02/22/2015 -- he was seen by my colleague on 01/19/2015 and at that time an incision and drainage was done on his right lateral forefoot on the dorsum. Today when I probed this wound it is frankly draining pus and it communicates with the ulcer on the plantar aspect of his right foot. The patient is still on IV ceftriaxone 2 g every 24 hours and is to be seen by Dr. [name] on Friday. 03/19/2015 -- On 03/04/2015 I spoke to Dr. 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 He recommended operative debridement which would probably result in the fifth metatarsal head and toe amputation.The patient would be referred back to [place] once he was done with surgery. He was admitted to [facility] yesterday and had surgery done by podiatry for a right fifth metatarsal acute osteomyelitis with cellulitis and abscess. He had a right foot incision and drainage with fifth metatarsal partial amputation and removal of toe infected bone and soft tissue with cultures. The wound was partially closed and packing of the distal end was done. Patient was already on cefepime 2 g IV every 8 hourly and put on vancomycin pending final cultures. He had grown moderate gram-negative rods, and later found to be rare diphtheroids. I received a call from Dr. Cannon Kettle the podiatrist and we discussed the above.  On 03/10/2015 he was found positive for  influenza a and has been put on Tamiflu. Since his discharge he has been seen by Dr. Cannon Kettle who is planning to remove his sutures this coming week. He was reviewed by Dr. [name] on 03/17/2015 and his Diflucan was stopped and vancomycin. After the 3 doses he is taking. He is going to change Ceftazidime to Zosyn 3.375 g IV every 8 hours. 03/25/2015 -- he was seen by the podiatrist a couple of days ago and the sutures were removed. She will follow back with him in 4 weeks' time and at that time x-rays will be taken and a custom molded insert would be made for his shoe. 04/01/2015 -- he was seen by Dr. [name] on 03/29/2015 who pulled the PICC line stopped his IV antibiotics and recommended starting doxycycline and levofloxacin for 2 weeks. He also stop the fluconazole. The patient will follow up with him only when necessary. Readmission: 03/15/17 on evaluation today patient presents for initial evaluation concerning the new issues although he has previously been evaluated in our clinic. Unfortunately the previous evaluation led to the patient having to proceed to amputation and so he is somewhat nervous about being here SHREYAS, DICKHAUT ([id number]) today. With that being said he has a very slight blister that occurred on the plantar aspect more medial on the right great toe that has been present for just a very short amount of time, several days. With that being said he felt like initially when he called that he somewhat overreacted but due to the fact that his previous issue led to amputation he is very cautious these days I explained that he did the right thing. With that being said he has been tolerating the dressing changes without complication mainly he just been covering this. He does not have any discomfort in secondary to neuropathy it's unlikely that he feels much. With that being said he does tell me that the issue that he had here is that he went barefoot when he knows he should  not have which subsequently led to the blisters. He states is definitely not doing that anymore. His most recent hemoglobin A1c was December and registered 6.9 his ABI today was 1.1. 03/27/17 on evaluation today patient appears to be doing great in regard to his right great toe ulcer. He has been tolerating the dressing changes without complication. The good news is this is making excellent progress and he seems to be caring for this in an excellent fashion. I see no evidence of breakdown that would make me concerned that he was at risk for infection/amputation. This has obviously been his concern due to the fifth toe amputation that was necessitated previous when he had a similar issue. Nonetheless this seems to be progressing much more nicely. Readmission: 03/13/2019 upon evaluation today patient presents for reevaluation here in the clinic concerning issues he has been having with his right foot on the lateral portion at the proximal end of the metatarsal. Subsequently he tells me that this just opened up in the past couple of days or so and the erythema really began about 24 to 48 hours ago. Fortunately there is no signs of systemic infection. 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 That left him with this bony prominence that is where the wound is currently. There is erythema surrounding that does have me concerned about cellulitis. With that being said he was noncompressible as far as  ABIs are concerned I do think we can need to check into arterial studies at this point. Patient does have a history of hypertension along with diabetes mellitus type 2. He also tends to develop quite a bit of callus white often. 03/20/2019 upon evaluation today patient appears to be doing very well in regard to his wounds currently. He has been tolerating the dressing changes without complication. Fortunately there is no signs of infection. His ABIs were good at this  point and registered at 1.07 on the left and 1.09 on the right. In regard to the x-ray this was negative as well for any signs of osteomyelitis. The patient also seems to be doing better in regard to the infection. He still has several days of the antibiotic left at this point best the Bactrim DS and he seems to be doing very well with this. Overall I am extremely pleased with the progress in 1 week's time he is going require some sharp debridement today however. 03/27/2019 upon evaluation today patient actually appears to be making some progress in regard to his wound. It is a little deeper but measuring smaller which is good news due to the fact that again were clear away some of the necrotic tissue which is why the depth is increasing a little bit. Nonetheless after like were getting very close to being down at a good wound bed. Fortunately there is no signs of active infection at this time. There is a little bit of erythema immediately surrounding the wound that we do want to be very cognizant of and careful about. For that reason I am going to go ahead and extend his antibiotic today for an additional 10 days that is the Bactrim. 04/03/2019 upon evaluation today patient appears to be making some progress. I do feel like the Annitta Needs is doing a good job along with the alginate is being packed into the wound space behind. He has been tolerating the dressing changes without complication. Fortunately there is no signs of active infection at this time. No fevers, chills, nausea, vomiting, or diarrhea. 04/10/2019 upon evaluation today patient appears to be making progress. The wound is not quite as deep but he still does not have a great wound surface yet for working on this and the Santyl does seem to be cleaning things up. Fortunately there is no evidence of active infection at this time. No fevers, chills, nausea, vomiting, or diarrhea. 04/17/2019 upon evaluation today patient appears to be doing excellent  at this time in regard to his foot ulcer. I do believe the Santyl with the alginate packed in behind has been of benefit for him and I am very pleased in this regard. With that being said I am feeling like the wound surface is improving quite a bit each time I see him still I do not believe we are at the point of stating that the wound bed is perfect but again were making progress. The patient is going require some sharp debridement today. 4/22; this is a patient who is using Santyl with calcium alginate. Apparently his wound dimensions have been improving. Patient is changing the dressing himself. He has a surgical shoe. He works in a sitting position therefore is not on his feet all that much. There is undermining 05/01/2019 upon evaluation today patient appears to be doing a little better in regard to the size of his wound though he still has some depth. This does seem to be much cleaner has been using Santyl on  the base of the wound followed by packing with silver alginate and behind. Nonetheless I do believe that based on what we are seeing we may need to switch to a collagen type dressing endoform may actually be excellent for him. 05/08/2019 upon evaluation today patient's wound actually appears to be showing better granulation tissue in the base of the wound. With that being said he does have some epiboly noted around the edges of the wound especially plantar and more distal. Subsequently this is can require sharp debridement today. Fortunately there is no signs of active systemic infection though I do feel like there is some local cellulitis noted at this point. The patient also notes has had increased drainage. 5/13; wound volume quite a bit improved this week down to 3 mm. Patient states pain and drainage better. Culture from last week grew staph aureus [not MRSA] and this is sensitive to quinolones and he is on Levaquin. HOWEVER he also grew abundant Enterococcus faecalis treatment of choice  for this is ampicillin. Quinolone coverage is unreliable 5/20 completing the Augmentin. Small wound on the right lateral foot. Thick rolled edges around the wound. Using silver alginate 05/29/2019 upon evaluation today patient appears to be doing better compared to last time I saw him. Fortunately there is no signs of active infection and I do feel like he is actually making good progress. Overall the quality of the wound bed along with the size looks improved and the inflammation/erythema that was previously noted by myself when I saw the couple weeks back actually has also resolved and this is great. Overall very pleased with how things stand. Electronic Signature(s) Signed: 05/29/2019 8:35:44 AM By: [name] PA-C Entered By: [name] on 05/29/2019 08:35:44 [name] ([id number]) -------------------------------------------------------------------------------- Physical Exam Details Patient Name: GERRIT, SCHREINER Date of Service: 05/29/2019 8:00 AM Medical Record Number: [id number] Patient Account Number: [id number] Date of Birth/Sex: November 17, 1965 (54 y.o. M) Treating RN: [name] Primary Care Provider: [name] Other Clinician: Referring Provider: [name] Treating Provider/Extender: STONE III, Jeaneen Cala Weeks in Treatment: 74 Constitutional Well-nourished and well-hydrated in no acute distress. Respiratory normal breathing without difficulty. Psychiatric this patient is able to make decisions and demonstrates good insight into disease process. Alert and Oriented x 3. pleasant and cooperative. Notes Upon inspection patient's wound bed actually showed signs of some callus buildup around the edges of the wound I carefully cleaned this away the patient tolerated that without any complication. I then did have to remove some of the slough from the central portion of the wound again he tolerated this without significant complication and post debridement  wound bed appears to be better. He did have a little bit of bleeding I thought I was going off to silver nitrate but we need up being able to avoid this which I was much happier with to be honest. Electronic Signature(s) Signed: 05/29/2019 8:36:13 AM By: [name] PA-C Entered By: [name] on 05/29/2019 08:36:13 [name] ([id number]) -------------------------------------------------------------------------------- Physician Orders Details Patient Name: [name] Date of Service: 05/29/2019 8:00 AM Medical Record Number: [id number] Patient Account Number: [id number] Date of Birth/Sex: [birth year] (54 y.o. 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 - in office o Dial antibacterial soap, wash wounds, rinse and pat dry prior to dressing wounds Primary Wound Dressing Wound #4 Right,Lateral Foot o Silver Collagen - moisten with normal saline Secondary Dressing Wound #4 Right,Lateral Foot o Gauze and Kerlix/Conform Dressing Change Frequency Wound #4 Right,Lateral Foot o Change dressing every day. Edema Control Wound #4 Right,Lateral Foot o Patient to wear own compression stockings - wear daily Off-Loading Wound #4 Right,Lateral Foot o Open toe surgical shoe with peg assist. Electronic Signature(s) Signed: 05/29/2019 12:17:42 PM By: [name] Signed: 05/29/2019  12:49:03 PM By: [name] PA-C Entered By: [name] on 05/29/2019 08:26:05 [name] ([id number]) -------------------------------------------------------------------------------- Problem List Details Patient Name: SACARIO, REETZ. Date of Service: 05/29/2019 8:00 AM Medical Record Number: [id number] Patient Account Number: [id number] Date of Birth/Sex: [birth year] (54 y.o. 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 03/13/2019 No Yes exposed L03.115 Cellulitis of right lower limb 05/15/2019 No Yes L84 Corns and callosities 03/13/2019 No Yes I10 Essential (primary) hypertension 03/13/2019 No Yes 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 M) Treating RN: [name] Primary Care Provider: [name] Other Clinician: Referring Provider: [name] Treating Provider/Extender: Melburn Hake, Contrina Orona Weeks in Treatment: 11 Subjective Chief Complaint Information obtained from Patient Right lateral foot ulcer History of Present Illness (HPI) This 54 year old male comes with an ulcerated area on the  plantar aspect of the right foot which she's had for approximately a month. I have known him from a previous visit at [facility] wound center and was treated in the months of April and May 2016 and rapidly healed a left plantar ulcer with a total contact cast. He has been a diabetic for about 16 years and tries to keep active and is fairly compliant with his diabetes management. 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 Past medical history significant for hypertension, hyperlipidemia, and status post appendectomy 1993. He does not smoke or drink alcohol. 01/14/2015 -- the patient had tolerated his total contact cast very well and had no problems and has had no systemic symptoms. However when his total contact cast was cut open he had excessive amount of purulent drainage in spite of being on antibiotics. He had had a recent x-ray done in the ER 12/22/2014 which showed IMPRESSION:No evidence of osseous erosion. Known soft tissue ulceration is not well characterized on radiograph. Scattered vascular calcifications seen. his last hemoglobin A1c in December was 7.3. He has been on Augmentin and doxycycline for the last 2 weeks. 01/21/2015 -- his culture grew rare growth of Pantoea species an MR moderate growth of Candida parapsilosis. it is sensitive to levofloxacin. He has not heard back  from the insurance company regarding his hyperbaric oxygen therapy. His MRI has not been done yet and we will try and get him an earlier date 01/28/2015 -- MRI was done last night -- IMPRESSION:1. Soft tissue ulcer overlying the plantar aspect of the fifth metatarsal head extending to the cortex. Subcortical marrow edema in the fifth metatarsal head with corresponding T1 hypointensity is concerning for early osteomyelitis of the plantar lateral aspect of the fifth metatarsal head. Chest x-ray done on 01/14/2015 shows bronchiectatic changes without infiltrate. EKG done on generally 17 2017 shows a normal sinus  rhythm and is a normal EKG. 02/04/2015 -- he was asked to see Dr. [name] last week and had 2 appointments but had to cancel both due to pressures of work. Last night he has woken up with severe pain in the foot and leg and it is swollen up. No fever or no change in his blood glucose. Addendum: I spoke with Dr. [name] who kindly agreed to accept the patient for inpatient therapy and have also opened to the hospitalist Dr. [name], and discuss details of the management including PICC line and repeat cultures. 02/12/2015-- -- was seen by Dr. [name] in the hospital and a PICC line was placed. He was to receive Ceftazidime 2 g every 12 hourly, oral levofloxacin 750 mg every 24 hourly and oral fluconazole 200 mg daily. The antibiotics were to be given for 4 weeks except the Diflucan was to be given for the first 2 weeks. Reviewed note from 02/10/2015 -- and Dr. [name] had recommended management for growth of MSSA and Serratia. He switched him from ceftazidime to ceftriaxone 2 g every 24 hours. Levofloxacin was stopped and he would continue on fluconazole for another week. He had asked me to decide whether further imaging was necessary and whether surgical debridement of the infected bone was needed. He is doing well and has been off work for this week and we will keep him off the next week. 02/22/2015 -- he was seen by my colleague on 01/19/2015 and at that time an incision and drainage was done on his right lateral forefoot on the dorsum. Today when I probed this wound it is frankly draining pus and it communicates with the ulcer on the plantar aspect of his right foot. The patient is still on IV ceftriaxone 2 g every 24 hours and is to be seen by Dr. [name] on Friday. 03/19/2015 -- On 03/04/2015 I spoke to Dr. 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 He recommended operative debridement which would probably result in the fifth metatarsal head and toe amputation.The patient would be referred back to [place] once he was done with surgery. He was admitted to [facility] yesterday and had surgery done by podiatry for a right fifth metatarsal acute osteomyelitis with cellulitis and abscess. He had a right foot incision and drainage with fifth metatarsal partial amputation and removal of toe infected bone and soft tissue with cultures. The wound was partially closed and packing of the distal end was done. Patient was already on cefepime 2 g IV every 8 hourly and put on vancomycin pending final cultures. He had grown moderate gram-negative rods, and later found to be rare diphtheroids. I received a call from Dr. Cannon Kettle the podiatrist and we discussed the above. On 03/10/2015 he  was found positive for influenza a and has been put on Tamiflu. Since his discharge he has been seen by Dr. Cannon Kettle who is planning to remove his sutures this coming week. He was reviewed by Dr. [name] on 03/17/2015 and his Diflucan was stopped and vancomycin. After the 3 doses he is taking. He is going to change Ceftazidime to Zosyn 3.375 g IV every 8 hours. 03/25/2015 -- he was seen by the podiatrist a couple of days ago and the sutures were removed. She will follow back with him in 4 weeks' time and at that time x-rays will be taken and a custom molded insert would be made for his shoe. 04/01/2015 -- he was seen by Dr. [name] on 03/29/2015 who pulled the PICC line stopped his IV antibiotics and recommended starting doxycycline and levofloxacin for 2 weeks. He also stop the fluconazole. The patient will follow up with him only when necessary. RAFI, SELIGA ([id number]) Readmission: 03/15/17 on evaluation today patient presents for initial evaluation concerning the new issues although he has previously been evaluated in our clinic.  Unfortunately the previous evaluation led to the patient having to proceed to amputation and so he is somewhat nervous about being here today. With that being said he has a very slight blister that occurred on the plantar aspect more medial on the right great toe that has been present for just a very short amount of time, several days. With that being said he felt like initially when he called that he somewhat overreacted but due to the fact that his previous issue led to amputation he is very cautious these days I explained that he did the right thing. With that being said he has been tolerating the dressing changes without complication mainly he just been covering this. He does not have any discomfort in secondary to neuropathy it's unlikely that he feels much. With that being said he does tell me that the issue that he had here is that he went barefoot when he knows he should not have which subsequently led to the blisters. He states is definitely not doing that anymore. His most recent hemoglobin A1c was December and registered 6.9 his ABI today was 1.1. 03/27/17 on evaluation today patient appears to be doing great in regard to his right great toe ulcer. He has been tolerating the dressing changes without complication. The good news is this is making excellent progress and he seems to be caring for this in an excellent fashion. I see no evidence of breakdown that would make me concerned that he was at risk for infection/amputation. This has obviously been his concern due to the fifth toe amputation that was necessitated previous when he had a similar issue. Nonetheless this seems to be progressing much more nicely. Readmission: 03/13/2019 upon evaluation today patient presents for reevaluation here in the clinic concerning issues he has been having with his right foot on the lateral portion at the proximal end of the metatarsal. Subsequently he tells me that this just opened up in the past couple of  days or so and the erythema really began about 24 to 48 hours ago. Fortunately there is no signs of systemic infection. 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 That left him with this bony prominence that is where the wound is currently. There is erythema surrounding that does have me concerned about cellulitis. With that being said he was noncompressible as far as ABIs are concerned  I do think we can need to check into arterial studies at this point. Patient does have a history of hypertension along with diabetes mellitus type 2. He also tends to develop quite a bit of callus white often. 03/20/2019 upon evaluation today patient appears to be doing very well in regard to his wounds currently. He has been tolerating the dressing changes without complication. Fortunately there is no signs of infection. His ABIs were good at this point and registered at 1.07 on the left and 1.09 on the right. In regard to the x-ray this was negative as well for any signs of osteomyelitis. The patient also seems to be doing better in regard to the infection. He still has several days of the antibiotic left at this point best the Bactrim DS and he seems to be doing very well with this. Overall I am extremely pleased with the progress in 1 week's time he is going require some sharp debridement today however. 03/27/2019 upon evaluation today patient actually appears to be making some progress in regard to his wound. It is a little deeper but measuring smaller which is good news due to the fact that again were clear away some of the necrotic tissue which is why the depth is increasing a little bit. Nonetheless after like were getting very close to being down at a good wound bed. Fortunately there is no signs of active infection at this time. There is a little bit of erythema immediately surrounding the wound that we do want to be very cognizant of and careful about. For that  reason I am going to go ahead and extend his antibiotic today for an additional 10 days that is the Bactrim. 04/03/2019 upon evaluation today patient appears to be making some progress. I do feel like the Annitta Needs is doing a good job along with the alginate is being packed into the wound space behind. He has been tolerating the dressing changes without complication. Fortunately there is no signs of active infection at this time. No fevers, chills, nausea, vomiting, or diarrhea. 04/10/2019 upon evaluation today patient appears to be making progress. The wound is not quite as deep but he still does not have a great wound surface yet for working on this and the Santyl does seem to be cleaning things up. Fortunately there is no evidence of active infection at this time. No fevers, chills, nausea, vomiting, or diarrhea. 04/17/2019 upon evaluation today patient appears to be doing excellent at this time in regard to his foot ulcer. I do believe the Santyl with the alginate packed in behind has been of benefit for him and I am very pleased in this regard. With that being said I am feeling like the wound surface is improving quite a bit each time I see him still I do not believe we are at the point of stating that the wound bed is perfect but again were making progress. The patient is going require some sharp debridement today. 4/22; this is a patient who is using Santyl with calcium alginate. Apparently his wound dimensions have been improving. Patient is changing the dressing himself. He has a surgical shoe. He works in a sitting position therefore is not on his feet all that much. There is undermining 05/01/2019 upon evaluation today patient appears to be doing a little better in regard to the size of his wound though he still has some depth. This does seem to be much cleaner has been using Santyl on the base of the  wound followed by packing with silver alginate and behind. Nonetheless I do believe that based on  what we are seeing we may need to switch to a collagen type dressing endoform may actually be excellent for him. 05/08/2019 upon evaluation today patient's wound actually appears to be showing better granulation tissue in the base of the wound. With that being said he does have some epiboly noted around the edges of the wound especially plantar and more distal. Subsequently this is can require sharp debridement today. Fortunately there is no signs of active systemic infection though I do feel like there is some local cellulitis noted at this point. The patient also notes has had increased drainage. 5/13; wound volume quite a bit improved this week down to 3 mm. Patient states pain and drainage better. Culture from last week grew staph aureus [not MRSA] and this is sensitive to quinolones and he is on Levaquin. HOWEVER he also grew abundant Enterococcus faecalis treatment of choice for this is ampicillin. Quinolone coverage is unreliable 5/20 completing the Augmentin. Small wound on the right lateral foot. Thick rolled edges around the wound. Using silver alginate 05/29/2019 upon evaluation today patient appears to be doing better compared to last time I saw him. Fortunately there is no signs of active infection and I do feel like he is actually making good progress. Overall the quality of the wound bed along with the size looks improved and the inflammation/erythema that was previously noted by myself when I saw the couple weeks back actually has also resolved and this is great. Overall very pleased with how things stand. ABSHIR, MORIS ([id number]) Objective Constitutional Well-nourished and well-hydrated in no acute distress. Vitals Time Taken: 8:57 AM, Height: 74 in, Weight: 308 lbs, BMI: 39.5, Temperature: 98.3 F, Pulse: 79 bpm, Respiratory Rate: 18 breaths/min, Blood Pressure: 163/65 mmHg. Respiratory normal breathing without difficulty. Psychiatric this patient is able to make  decisions and demonstrates good insight into disease process. Alert and Oriented x 3. pleasant and cooperative. General Notes: Upon inspection patient's wound bed actually showed signs of some callus buildup around the edges of the wound I carefully cleaned this away the patient tolerated that without any complication. I then did have to remove some of the slough from the central portion of the wound again he tolerated this without significant complication and post debridement wound bed appears to be better. He did have a little bit of bleeding I thought I was going off to silver nitrate but we need up being able to avoid this which I was much happier with to be honest. Integumentary (Hair, Skin) Wound #4 status is Open. Original cause of wound was Gradually Appeared. The wound is located on the Right,Lateral Foot. The wound measures 0.5cm length x 0.4cm width x 0.7cm depth; 0.157cm^2 area and 0.11cm^3 volume. There is Fat Layer (Subcutaneous Tissue) Exposed exposed. There is undermining starting at 12:00 and ending at 12:00 with a maximum distance of 0.7cm. There is a medium amount of serous drainage noted. The wound margin is flat and intact. There is large (67-100%) pink granulation within the wound bed. There is a small (1-33%) amount of necrotic tissue within the wound bed including Adherent Slough. Assessment Active Problems ICD-10 Type 2 diabetes mellitus with foot ulcer 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d Cellulitis of right lower limb Corns and callosities Essential (primary) hypertension Acquired absence of other left toe(s) Procedures Wound #4 Pre-procedure diagnosis of Wound #4 is a  Diabetic Wound/Ulcer of the Lower Extremity located on the Right,Lateral Foot .Severity of Tissue Pre Debridement is: Fat layer exposed. There was a Excisional Skin/Subcutaneous Tissue Debridement with a total area of 0.2 sq cm performed by STONE III, Marian Grandt E.,  PA-C. With the following instrument(s): Curette to remove Viable and Non-Viable tissue/material. Material removed includes Callus and Subcutaneous Tissue and after achieving pain control using Lidocaine. A time out was conducted at 08:23, prior to the start of the procedure. There was no bleeding. The procedure was tolerated well. Post Debridement Measurements: 0.5cm length x 0.4cm width x 0.7cm depth; 0.11cm^3 volume. Character of Wound/Ulcer Post Debridement is stable. Severity of Tissue Post Debridement is: Fat layer exposed. Post procedure Diagnosis Wound #4: Same as Pre-Procedure Plan Wound Cleansing: Wound #4 Right,Lateral Foot: Clean wound with Normal Saline. - in office Dial antibacterial soap, wash wounds, rinse and pat dry prior to dressing wounds Primary Wound Dressing: Wound #4 Right,Lateral Foot: AVRUM, RAMP ([id number]) Silver Collagen - moisten with normal saline Secondary Dressing: Wound #4 Right,Lateral Foot: Gauze and Kerlix/Conform Dressing Change Frequency: Wound #4 Right,Lateral Foot: Change dressing every day. Edema Control: Wound #4 Right,Lateral Foot: Patient to wear own compression stockings - wear daily Off-Loading: Wound #4 Right,Lateral Foot: Open toe surgical shoe with peg assist. 1. I would recommend currently that we go ahead and continue with the silver collagen dressing I think this is probably still the best way to go for him at this point. 2. I am get a recommend as well that we continue to see him for weekly visits I think we need to make sure to keep the callus from away from the edges of the wound bed I think this is of utmost importance as far as getting to heal appropriately. 3. I am also going to suggest that we have the patient continue to wear his compression stockings as well as the surgical shoe with peg assist. We will see patient back for reevaluation in 1 week here in the clinic. If anything worsens or changes patient will  contact our office for additional recommendations. Electronic Signature(s) Signed: 05/29/2019 8:36:57 AM By: [name] PA-C Entered By: [name] on 05/29/2019 08:36:57 [name] ([id number]) -------------------------------------------------------------------------------- SuperBill Details Patient Name: [name] Date of Service: 05/29/2019 Medical Record Number: [id number] Patient Account Number: [id number] Date of Birth/Sex: 10-Sep-1965 (54 y.o. M) Treating RN: [name] Primary Care Provider: [name] Other Clinician: Referring Provider: [name] Treating Provider/Extender: Melburn Hake, Aliena Ghrist Weeks in Treatment: 11 Diagnosis Coding ICD-10 Codes Code Description E11.621 Type 2 diabetes mellitus with foot ulcer L97.512 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d L03.115 Cellulitis of right lower limb L84 Corns and callosities I10 Essential (primary) hypertension Z89.422 Acquired absence of other left toe(s) Facility Procedures CPT4 Code: [id number] Description: [id number] - DEB SUBQ TISSUE 20 SQ CM/< Modifier: Quantity: 1 CPT4 Code: Description: ICD-10 Diagnosis Description L97.512 Non-pressure chronic ulcer of other part of right foot with fat layer ex Modifier: posed Quantity: Physician Procedures CPT4 [id number] Description: 11042 - WC PHYS SUBQ TISS 20 SQ CM Modifier: Quantity: 1 CPT4 Code: Description: ICD-10 Diagnosis Description L97.512 Non-pressure chronic ulcer of other part of right foot with fat layer ex Modifier: posed Quantity: Electronic Signature(s) Signed: 05/29/2019 8:37:18 AM By: [name] PA-C Entered By: [name] on 05/29/2019 08:37:18

## 2019-05-30 NOTE — Progress Notes (Signed)
[name] ([name]) Visit Report for 05/29/2019 Arrival Information Details Patient Name: [name], [name]. Date of Service: 05/29/2019 8:00 AM Medical Record Number: [name] Patient Account Number: [id number] Date of Birth/Sex: [birth year] (54 y.o. M) Treating [name]: [name] Primary Care [name]: [name] Other Clinician: Referring Bethannie Iglehart: [name] Treating [name]/Extender: [name], [name] [name]: 11 Visit Information History Since Last Visit Added or deleted any medications: No Patient Arrived: Ambulatory Any new allergies or adverse reactions: No Arrival Time: 07:58 Had a fall or experienced change in No Accompanied By: self activities of daily living that may affect Transfer Assistance: None risk of falls: Patient Identification Verified: Yes Signs or symptoms of abuse/neglect since last visito No Secondary Verification Process Completed: Yes Hospitalized since last visit: No Patient Has Alerts: Yes Implantable device outside of the clinic excluding No Patient Alerts: DMII cellular tissue based products placed in the center since last visit: Has Dressing in Place as Prescribed: Yes Pain Present Now: No Electronic Signature(s) Signed: 05/29/2019 11:04:59 AM By: [name] [name] Entered By: [name] on 05/29/2019 07:59:03 [name] ([name]) -------------------------------------------------------------------------------- Encounter Discharge Information Details Patient Name: [name] Date of Service: 05/29/2019 8:00 AM Medical Record Number: [name] Patient Account Number: [id number] Date of Birth/Sex: January 30, 1965 (54 y.o. M) Treating [name]: [name] Primary Care Afton Mikelson: [name] Other Clinician: Referring [name]: [name] Treating [name]/Extender: [name], [name] [name]: 11 Encounter Discharge Information Items  Post Procedure Vitals Discharge Condition: Stable Temperature (F): 98.3 Ambulatory Status: Ambulatory Pulse (bpm): 79 Discharge Destination: Home Respiratory Rate (breaths/min): 16 Transportation: Private Auto Blood Pressure (mmHg): 163/65 Accompanied By: self Schedule Follow-up Appointment: Yes Clinical Summary of Care: Electronic Signature(s) Signed: 05/29/2019 12:17:42 PM By: [name] Entered By: [name] on 05/29/2019 08:27:45 [name] ([name]) -------------------------------------------------------------------------------- Lower Extremity Assessment Details Patient Name: [name] Date of Service: 05/29/2019 8:00 AM Medical Record Number: [name] Patient Account Number: [id number] Date of Birth/Sex: Oct 12, 1965 (54 y.o. M) Treating [name]: [name] Primary Care Rylea Selway: [name] Other Clinician: Referring [name]: [name] Treating [name]/Extender: [name], [name] [name]: 11 Vascular Assessment Pulses: Dorsalis Pedis Palpable: [Right:Yes] Posterior Tibial Palpable: [Right:Yes] Electronic Signature(s) Signed: 05/30/2019 2:01:12 PM By: [name], BSN, [name], [name], Kim [name], [name] Entered By: [name], BSN, [name], [name], [name] on 05/29/2019 08:07:03 [name] ([name]) -------------------------------------------------------------------------------- Multi Wound Chart Details Patient Name: [name], [name]. Date of Service: 05/29/2019 8:00 AM Medical Record Number: [name] Patient Account Number: [id number] Date of Birth/Sex: 1965/01/03 (54 y.o. M) Treating [name]: [name] Primary Care [name]: [name] Other Clinician: Referring [name]: [name] Treating [name]/Extender: [name], [name] [name]: 11 Vital Signs Height(in): 74 Pulse(bpm): 60 Weight(lbs): 308 Blood Pressure(mmHg): 163/65 Body Mass Index(BMI): 40 Temperature(F): 98.3 Respiratory Rate(breaths/min):  18 Photos: [N/A:N/A] Wound Location: Right, Lateral Foot N/A N/A Wounding Event: Gradually Appeared N/A N/A Primary Etiology: Diabetic Wound/Ulcer of the Lower N/A N/A Extremity Comorbid History: Hypertension, Type II Diabetes, N/A N/A Neuropathy Date Acquired: 03/12/2019 N/A N/A [name] of [name]: 11 N/A N/A Wound Status: Open N/A N/A Pending Amputation on Yes N/A N/A Presentation: Measurements L x W x D (cm) 0.5x0.4x0.7 N/A N/A Area (cm) : 0.157 N/A N/A Volume (cm) : 0.11 N/A N/A % Reduction in Area: 75.30% N/A N/A % Reduction in Volume: 13.40% N/A N/A Starting Position 1 (o'clock): 12 Ending Position 1 (o'clock): 12 Maximum Distance 1 (cm): 0.7 Undermining: Yes N/A N/A Classification: Grade 1  N/A N/A Exudate Amount: Medium N/A N/A Exudate Type: Serous N/A N/A Exudate Color: amber N/A N/A Wound Margin: Flat and Intact N/A N/A Granulation Amount: Large (67-100%) N/A N/A Granulation Quality: Pink N/A N/A Necrotic Amount: Small (1-33%) N/A N/A Exposed Structures: Fat Layer (Subcutaneous Tissue) N/A N/A Exposed: Yes Fascia: No Tendon: No Muscle: No Joint: No Bone: No Epithelialization: None N/A N/A [name] Notes Electronic Signature(s) [name] ([name]) Signed: 05/30/2019 2:01:12 PM By: [name], BSN, [name], [name], Kim [name], [name] Entered By: [name], BSN, [name], [name], [name] on 05/29/2019 08:07:17 [name] ([name]) -------------------------------------------------------------------------------- Multi-Disciplinary Care Plan Details Patient Name: [name], [name]. Date of Service: 05/29/2019 8:00 AM Medical Record Number: [name] Patient Account Number: [id number] Date of Birth/Sex: 09-27-1965 (54 y.o. M) Treating [name]: [name] Primary Care [name]: [name] Other Clinician: Referring [name]: [name] Treating [name]/Extender: [name], [name] [name]: 11 Active Inactive Nutrition Nursing Diagnoses: Impaired  glucose control: actual or potential Goals: Patient/caregiver verbalizes understanding of need to maintain therapeutic glucose control per primary care physician Date Initiated: 03/13/2019 Target Resolution Date: 04/11/2019 Goal Status: Active Interventions: Assess patient nutrition upon admission and as needed per policy Notes: Orientation to the Wound Care Program Nursing Diagnoses: Knowledge deficit related to the wound healing center program Goals: Patient/caregiver will verbalize understanding of the [place] Date Initiated: 03/13/2019 Target Resolution Date: 04/11/2019 Goal Status: Active Interventions: Provide education on orientation to the wound center Notes: Wound/Skin Impairment Nursing Diagnoses: Impaired tissue integrity Goals: Ulcer/skin breakdown will have a volume reduction of 30% by week 4 Date Initiated: 03/13/2019 Target Resolution Date: 04/11/2019 Goal Status: Active Interventions: Assess ulceration(s) every visit Notes: Electronic Signature(s) Signed: 05/30/2019 2:01:12 PM By: [name], BSN, [name], [name], Kim [name], [name] Entered By: [name], BSN, [name], [name], [name] on 05/29/2019 08:07:11 [name] ([name]) -------------------------------------------------------------------------------- Pain Assessment Details Patient Name: [name], [name]. Date of Service: 05/29/2019 8:00 AM Medical Record Number: [name] Patient Account Number: [id number] Date of Birth/Sex: [birth year] (54 y.o. M) Treating [name]: [name] Primary Care Ajanee Buren: [name] Other Clinician: Referring Arlinda Barcelona: [name] Treating Emilly Lavey/Extender: [name], [name] [name]: 11 Active Problems Location of Pain Severity and Description of Pain Patient Has Paino No Site Locations Pain Management and Medication Current Pain Management: Electronic Signature(s) Signed: 05/30/2019 2:01:12 PM By: [name], BSN, [name], [name], Kim [name], [name] Entered By: [name], BSN, [name],  [name], [name] on 05/29/2019 08:03:46 [name] ([name]) -------------------------------------------------------------------------------- Patient/Caregiver Education Details Patient Name: [name] Date of Service: 05/29/2019 8:00 AM Medical Record Number: [name] Patient Account Number: [id number] Date of Birth/Gender: Jun 01, 1965 (54 y.o. M) Treating [name]: [name] Primary Care Physician: [name] Other Clinician: Referring Physician: [name] Treating Physician/Extender: Sharalyn Ink in [name]: 11 Education Assessment Education Provided To: Patient Education Topics Provided Wound/Skin Impairment: Handouts: Caring for Your Ulcer Methods: Demonstration, Explain/Verbal Responses: State content correctly Electronic Signature(s) Signed: 05/29/2019 12:17:42 PM By: [name] Entered By: [name] on 05/29/2019 08:27:01 [name] ([name]) -------------------------------------------------------------------------------- Wound Assessment Details Patient Name: [name] Date of Service: 05/29/2019 8:00 AM Medical Record Number: [name] Patient Account Number: [id number] Date of Birth/Sex: [birth year] (54 y.o. M) Treating [name]: [name] Primary Care Adlai Nieblas: [name] Other Clinician: Referring Tamanika Heiney: [name] Treating Fletcher Ostermiller/Extender: [name], [name] [name]: 11 Wound Status Wound Number: 4 Primary Etiology: Diabetic Wound/Ulcer of the Lower Extremity Wound Location: Right, Lateral Foot Wound Status: Open Wounding Event: Gradually Appeared Comorbid History: Hypertension,  Type II Diabetes, Neuropathy Date Acquired: 03/12/2019 [name] Of [name]: 11 Clustered Wound: No Pending Amputation On Presentation Photos Wound Measurements Length: (cm) 0.5 % Redu Width: (cm) 0.4 % Redu Depth: (cm) 0.7 Epithe Area: (cm) 0.157 Under Volume: (cm) 0.11 St End Max ction in  Area: 75.3% ction in Volume: 13.4% lialization: None mining: Yes arting Position (o'clock): 12 ing Position (o'clock): 12 imum Distance: (cm) 0.7 Wound Description Classification: Grade 1 Foul O Wound Margin: Flat and Intact Slough Exudate Amount: Medium Exudate Type: Serous Exudate Color: amber dor After Cleansing: No /Fibrino Yes Wound Bed Granulation Amount: Large (67-100%) Exposed Structure Granulation Quality: Pink Fascia Exposed: No Necrotic Amount: Small (1-33%) Fat Layer (Subcutaneous Tissue) Exposed: Yes Necrotic Quality: Adherent Slough Tendon Exposed: No Muscle Exposed: No Joint Exposed: No Bone Exposed: No [name] Notes Wound #4 (Right, Lateral Foot) [name], [name] ([id number]) Notes prisma, gauze, conform Electronic Signature(s) Signed: 05/30/2019 2:01:12 PM By: [name], BSN, [name], [name], Kim [name], [name] Entered By: [name], BSN, [name], [name], [name] on 05/29/2019 08:06:25 [name] ([id number]) -------------------------------------------------------------------------------- Vitals Details Patient Name: [name] Date of Service: 05/29/2019 8:00 AM Medical Record Number: [id number] Patient Account Number: [id number] Date of Birth/Sex: 1965/08/13 (54 y.o. M) Treating [name]: [name] Primary Care Shanikqua Zarzycki: [name] Other Clinician: Referring Oralia Criger: [name] Treating Malak Orantes/Extender: [name], [name] [name]: 11 Vital Signs Time Taken: 08:57 Temperature (F): 98.3 Height (in): 74 Pulse (bpm): 79 Weight (lbs): 308 Respiratory Rate (breaths/min): 18 Body Mass Index (BMI): 39.5 Blood Pressure (mmHg): 163/65 Reference Range: 80 - 120 mg / dl Electronic Signature(s) Signed: 05/29/2019 11:04:59 AM By: [name] [name] Entered By: [name] on 05/29/2019 08:00:17

## 2019-06-05 ENCOUNTER — Encounter: Payer: Managed Care, Other (non HMO) | Attending: Physician Assistant | Admitting: Physician Assistant

## 2019-06-05 ENCOUNTER — Other Ambulatory Visit: Payer: Self-pay

## 2019-06-05 DIAGNOSIS — L84 Corns and callosities: Secondary | ICD-10-CM | POA: Diagnosis not present

## 2019-06-05 DIAGNOSIS — E11621 Type 2 diabetes mellitus with foot ulcer: Secondary | ICD-10-CM | POA: Diagnosis not present

## 2019-06-05 DIAGNOSIS — I10 Essential (primary) hypertension: Secondary | ICD-10-CM | POA: Insufficient documentation

## 2019-06-05 DIAGNOSIS — E1140 Type 2 diabetes mellitus with diabetic neuropathy, unspecified: Secondary | ICD-10-CM | POA: Insufficient documentation

## 2019-06-05 DIAGNOSIS — L97512 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d: Secondary | ICD-10-CM | POA: Diagnosis not present

## 2019-06-05 DIAGNOSIS — E785 Hyperlipidemia, unspecified: Secondary | ICD-10-CM | POA: Diagnosis not present

## 2019-06-05 DIAGNOSIS — L03115 Cellulitis of right lower limb: Secondary | ICD-10-CM | POA: Diagnosis not present

## 2019-06-05 DIAGNOSIS — Z89422 Acquired absence of other left toe(s): Secondary | ICD-10-CM | POA: Diagnosis not present

## 2019-06-07 NOTE — Progress Notes (Signed)
CHRIST, FULLENWIDER ([id number]) Visit Report for 06/05/2019 Arrival Information Details Patient Name: [name], [name]. Date of Service: 06/05/2019 8:00 AM Medical Record Number: [id number] Patient Account Number: [id number] Date of Birth/Sex: [birth year] (54 y.o. M) Treating RN: Montey Hora Primary Care Johnpaul Gillentine: [name] Other Clinician: Referring Kaylana Fenstermacher: [name] Treating Jaylei Fuerte/Extender: Melburn Hake, HOYT Weeks in Treatment: 12 Visit Information History Since Last Visit Added or deleted any medications: No Patient Arrived: Ambulatory Any new allergies or adverse reactions: No Arrival Time: 08:09 Had a fall or experienced change in No Accompanied By: self activities of daily living that may affect Transfer Assistance: None risk of falls: Patient Identification Verified: Yes Signs or symptoms of abuse/neglect since last visito No Secondary Verification Process Completed: Yes Hospitalized since last visit: No Patient Has Alerts: Yes Implantable device outside of the clinic excluding No Patient Alerts: DMII cellular tissue based products placed in the center since last visit: Has Dressing in Place as Prescribed: Yes Pain Present Now: No Electronic Signature(s) Signed: 06/05/2019 4:34:20 PM By: Montey Hora Entered By: Montey Hora on 06/05/2019 08:09:45 [name] ([id number]) -------------------------------------------------------------------------------- Clinic Level of Care Assessment Details Patient Name: [name] Date of Service: 06/05/2019 8:00 AM Medical Record Number: [id number] Patient Account Number: [id number] Date of Birth/Sex: 08-Aug-1965 (54 y.o. M) Treating RN: [name] Primary Care Revanth Neidig: [name] Other Clinician: Referring Toney Difatta: [name] Treating Mirabelle Cyphers/Extender: Melburn Hake, HOYT Weeks in Treatment: 12 Clinic Level of Care Assessment Items TOOL 4 Quantity Score []  - Use when only an  EandM is performed on FOLLOW-UP visit 0 ASSESSMENTS - Nursing Assessment / Reassessment X - Reassessment of Co-morbidities (includes updates in patient status) 1 10 X- 1 5 Reassessment of Adherence to Treatment Plan ASSESSMENTS - Wound and Skin Assessment / Reassessment X - Simple Wound Assessment / Reassessment - one wound 1 5 []  - 0 Complex Wound Assessment / Reassessment - multiple wounds []  - 0 Dermatologic / Skin Assessment (not related to wound area) ASSESSMENTS - Focused Assessment []  - Circumferential Edema Measurements - multi extremities 0 []  - 0 Nutritional Assessment / Counseling / Intervention []  - 0 Lower Extremity Assessment (monofilament, tuning fork, pulses) []  - 0 Peripheral Arterial Disease Assessment (using hand held doppler) ASSESSMENTS - Ostomy and/or Continence Assessment and Care []  - Incontinence Assessment and Management 0 []  - 0 Ostomy Care Assessment and Management (repouching, etc.) PROCESS - Coordination of Care X - Simple Patient / Family Education for ongoing care 1 15 []  - 0 Complex (extensive) Patient / Family Education for ongoing care []  - 0 Staff obtains Programmer, systems, Records, Test Results / Process Orders []  - 0 Staff telephones HHA, Nursing Homes / Clarify orders / etc []  - 0 Routine Transfer to another Facility (non-emergent condition) []  - 0 Routine Hospital Admission (non-emergent condition) []  - 0 New Admissions / Biomedical engineer / Ordering NPWT, Apligraf, etc. []  - 0 Emergency Hospital Admission (emergent condition) X- 1 10 Simple Discharge Coordination []  - 0 Complex (extensive) Discharge Coordination PROCESS - Special Needs []  - Pediatric / Minor Patient Management 0 []  - 0 Isolation Patient Management []  - 0 Hearing / Language / Visual special needs []  - 0 Assessment of Community assistance (transportation, D/C planning, etc.) []  - 0 Additional assistance / Altered mentation []  - 0 Support Surface(s) Assessment  (bed, cushion, seat, etc.) INTERVENTIONS - Wound Cleansing / Measurement COOLIDGE, GOSSARD. ([id number]) X- 1 5 Simple Wound Cleansing - one wound []  - 0 Complex Wound Cleansing - multiple  wounds X- 1 5 Wound Imaging (photographs - any number of wounds) []  - 0 Wound Tracing (instead of photographs) X- 1 5 Simple Wound Measurement - one wound []  - 0 Complex Wound Measurement - multiple wounds INTERVENTIONS - Wound Dressings []  - Small Wound Dressing one or multiple wounds 0 X- 1 15 Medium Wound Dressing one or multiple wounds []  - 0 Large Wound Dressing one or multiple wounds []  - 0 Application of Medications - topical []  - 0 Application of Medications - injection INTERVENTIONS - Miscellaneous []  - External ear exam 0 []  - 0 Specimen Collection (cultures, biopsies, blood, body fluids, etc.) []  - 0 Specimen(s) / Culture(s) sent or taken to Lab for analysis []  - 0 Patient Transfer (multiple staff / Civil Service fast streamer / Similar devices) []  - 0 Simple Staple / Suture removal (25 or less) []  - 0 Complex Staple / Suture removal (26 or more) []  - 0 Hypo / Hyperglycemic Management (close monitor of Blood Glucose) []  - 0 Ankle / Brachial Index (ABI) - do not check if billed separately X- 1 5 Vital Signs Has the patient been seen at the hospital within the last three years: Yes Total Score: 80 Level Of Care: New/Established - Level 3 Electronic Signature(s) Signed: 06/05/2019 11:31:22 AM By: [name] Entered By: [name] on 06/05/2019 08:45:26 [name] ([id number]) -------------------------------------------------------------------------------- Encounter Discharge Information Details Patient Name: [name] Date of Service: 06/05/2019 8:00 AM Medical Record Number: [id number] Patient Account Number: [id number] Date of Birth/Sex: 06-03-1965 (54 y.o. M) Treating RN: [name] Primary Care Izetta Sakamoto: [name] Other Clinician: Referring  Ruthel Martine: [name] Treating Janeene Sand/Extender: Melburn Hake, HOYT Weeks in Treatment: 12 Encounter Discharge Information Items Discharge Condition: Stable Ambulatory Status: Ambulatory Discharge Destination: Home Transportation: Private Auto Accompanied By: self Schedule Follow-up Appointment: Yes Clinical Summary of Care: Electronic Signature(s) Signed: 06/05/2019 11:31:22 AM By: [name] Entered By: [name] on 06/05/2019 08:46:45 [name] ([id number]) -------------------------------------------------------------------------------- Lower Extremity Assessment Details Patient Name: [name] Date of Service: 06/05/2019 8:00 AM Medical Record Number: [id number] Patient Account Number: [id number] Date of Birth/Sex: [birth year] (54 y.o. M) Treating RN: Montey Hora Primary Care Crystalle Popwell: [name] Other Clinician: Referring Dominyck Reser: [name] Treating Rosario Duey/Extender: STONE III, HOYT Weeks in Treatment: 12 Edema Assessment Assessed: [Left: No] [Right: No] Edema: [Left: Ye] [Right: s] Vascular Assessment Pulses: Dorsalis Pedis Palpable: [Right:Yes] Electronic Signature(s) Signed: 06/05/2019 4:34:20 PM By: Montey Hora Entered By: Montey Hora on 06/05/2019 08:16:25 [name] ([id number]) -------------------------------------------------------------------------------- Multi Wound Chart Details Patient Name: [name] Date of Service: 06/05/2019 8:00 AM Medical Record Number: [id number] Patient Account Number: [id number] Date of Birth/Sex: 03/30/1965 (54 y.o. M) Treating RN: [name] Primary Care Divit Stipp: [name] Other Clinician: Referring Jamine Highfill: [name] Treating Adynn Caseres/Extender: Melburn Hake, HOYT Weeks in Treatment: 12 Vital Signs Height(in): 74 Pulse(bpm): 79 Weight(lbs): 308 Blood Pressure(mmHg): 153/58 Body Mass Index(BMI): 40 Temperature(F): 98.1 Respiratory  Rate(breaths/min): 16 Photos: [N/A:N/A] Wound Location: Right, Lateral Foot N/A N/A Wounding Event: Gradually Appeared N/A N/A Primary Etiology: Diabetic Wound/Ulcer of the Lower N/A N/A Extremity Comorbid History: Hypertension, Type II Diabetes, N/A N/A Neuropathy Date Acquired: 03/12/2019 N/A N/A Weeks of Treatment: 12 N/A N/A Wound Status: Open N/A N/A Pending Amputation on Yes N/A N/A Presentation: Measurements L x W x D (cm) 0.9x0.5x0.6 N/A N/A Area (cm) : 0.353 N/A N/A Volume (cm) : 0.212 N/A N/A % Reduction in Area: 44.50% N/A N/A % Reduction in Volume: -66.90% N/A N/A Classification: Grade  1 N/A N/A Exudate Amount: Medium N/A N/A Exudate Type: Serous N/A N/A Exudate Color: amber N/A N/A Wound Margin: Flat and Intact N/A N/A Granulation Amount: Medium (34-66%) N/A N/A Granulation Quality: Pink N/A N/A Necrotic Amount: Medium (34-66%) N/A N/A Exposed Structures: Fat Layer (Subcutaneous Tissue) N/A N/A Exposed: Yes Fascia: No Tendon: No Muscle: No Joint: No Bone: No Epithelialization: None N/A N/A Treatment Notes Electronic Signature(s) Signed: 06/05/2019 11:31:22 AM By: [name] Entered By: [name] on 06/05/2019 08:42:26 [name] ([name] ([id number]) -------------------------------------------------------------------------------- Multi-Disciplinary Care Plan Details Patient Name: DWANE, ANDRES. Date of Service: 06/05/2019 8:00 AM Medical Record Number: [id number] Patient Account Number: [id number] Date of Birth/Sex: 1965/02/11 (54 y.o. M) Treating RN: [name] Primary Care Kyrollos Cordell: [name] Other Clinician: Referring Quanta Roher: [name] Treating Yaxiel Minnie/Extender: Melburn Hake, HOYT Weeks in Treatment: 12 Active Inactive Nutrition Nursing Diagnoses: Impaired glucose control: actual or potential Goals: Patient/caregiver verbalizes understanding of need to maintain therapeutic glucose control  per primary care physician Date Initiated: 03/13/2019 Target Resolution Date: 04/11/2019 Goal Status: Active Interventions: Assess patient nutrition upon admission and as needed per policy Notes: Orientation to the Wound Care Program Nursing Diagnoses: Knowledge deficit related to the wound healing center program Goals: Patient/caregiver will verbalize understanding of the [place] Date Initiated: 03/13/2019 Target Resolution Date: 04/11/2019 Goal Status: Active Interventions: Provide education on orientation to the wound center Notes: Wound/Skin Impairment Nursing Diagnoses: Impaired tissue integrity Goals: Ulcer/skin breakdown will have a volume reduction of 30% by week 4 Date Initiated: 03/13/2019 Target Resolution Date: 04/11/2019 Goal Status: Active Interventions: Assess ulceration(s) every visit Notes: Electronic Signature(s) Signed: 06/05/2019 11:31:22 AM By: [name] Entered By: [name] on 06/05/2019 08:42:17 [name] ([id number]) -------------------------------------------------------------------------------- Pain Assessment Details Patient Name: [name] Date of Service: 06/05/2019 8:00 AM Medical Record Number: [id number] Patient Account Number: [id number] Date of Birth/Sex: 05-09-1965 (54 y.o. M) Treating RN: Montey Hora Primary Care Ramsay Bognar: [name] Other Clinician: Referring Garold Sheeler: [name] Treating Adis Sturgill/Extender: Melburn Hake, HOYT Weeks in Treatment: 12 Active Problems Location of Pain Severity and Description of Pain Patient Has Paino No Site Locations Pain Management and Medication Current Pain Management: Electronic Signature(s) Signed: 06/05/2019 4:34:20 PM By: Montey Hora Entered By: Montey Hora on 06/05/2019 08:09:53 [name] ([id number]) -------------------------------------------------------------------------------- Patient/Caregiver Education Details Patient  Name: [name] Date of Service: 06/05/2019 8:00 AM Medical Record Number: [id number] Patient Account Number: [id number] Date of Birth/Gender: 30-Oct-1965 (54 y.o. M) Treating RN: [name] Primary Care Physician: [name] Other Clinician: Referring Physician: [name] Treating Physician/Extender: Sharalyn Ink in Treatment: 12 Education Assessment Education Provided To: Patient Education Topics Provided Wound/Skin Impairment: Handouts: Caring for Your Ulcer Methods: Demonstration, Explain/Verbal Responses: State content correctly Electronic Signature(s) Signed: 06/05/2019 11:31:22 AM By: [name] Entered By: [name] on 06/05/2019 08:45:37 [name] ([id number]) -------------------------------------------------------------------------------- Wound Assessment Details Patient Name: [name] Date of Service: 06/05/2019 8:00 AM Medical Record Number: [id number] Patient Account Number: [id number] Date of Birth/Sex: 02/06/1965 (54 y.o. M) Treating RN: Montey Hora Primary Care Aleanna Menge: [name] Other Clinician: Referring Aamori Mcmasters: [name] Treating Shanece Cochrane/Extender: STONE III, HOYT Weeks in Treatment: 12 Wound Status Wound Number: 4 Primary Etiology: Diabetic Wound/Ulcer of the Lower Extremity Wound Location: Right, Lateral Foot Wound Status: Open Wounding Event: Gradually Appeared Comorbid History: Hypertension, Type II Diabetes, Neuropathy Date Acquired: 03/12/2019 Weeks Of Treatment: 12 Clustered Wound: No Pending Amputation On Presentation Photos Wound Measurements Length: (cm)  0.9 % Red Width: (cm) 0.5 % Red Depth: (cm) 0.6 Epith Area: (cm) 0.353 Tunn Volume: (cm) 0.212 Unde uction in Area: 44.5% uction in Volume: -66.9% elialization: None eling: No rmining: No Wound Description Classification: Grade 1 Foul Wound Margin: Flat and Intact Sloug Exudate Amount: Medium Exudate Type:  Serous Exudate Color: amber Odor After Cleansing: No h/Fibrino Yes Wound Bed Granulation Amount: Medium (34-66%) Exposed Structure Granulation Quality: Pink Fascia Exposed: No Necrotic Amount: Medium (34-66%) Fat Layer (Subcutaneous Tissue) Exposed: Yes Necrotic Quality: Adherent Slough Tendon Exposed: No Muscle Exposed: No Joint Exposed: No Bone Exposed: No Treatment Notes Wound #4 (Right, Lateral Foot) Notes prisma, plain packing strip, gauze, conform BODIE, ABERNETHY ([id number]) Electronic Signature(s) Signed: 06/05/2019 4:34:20 PM By: Montey Hora Entered By: Montey Hora on 06/05/2019 08:15:55 [name] ([id number]) -------------------------------------------------------------------------------- Vitals Details Patient Name: [name] Date of Service: 06/05/2019 8:00 AM Medical Record Number: [id number] Patient Account Number: [id number] Date of Birth/Sex: [birth year] (54 y.o. M) Treating RN: Montey Hora Primary Care Jeremiah Curci: [name] Other Clinician: Referring Natalyia Innes: [name] Treating Jasleen Riepe/Extender: Melburn Hake, HOYT Weeks in Treatment: 12 Vital Signs Time Taken: 08:10 Temperature (F): 98.1 Height (in): 74 Pulse (bpm): 79 Weight (lbs): 308 Respiratory Rate (breaths/min): 16 Body Mass Index (BMI): 39.5 Blood Pressure (mmHg): 153/58 Reference Range: 80 - 120 mg / dl Electronic Signature(s) Signed: 06/05/2019 4:34:20 PM By: Montey Hora Entered By: Montey Hora on 06/05/2019 08:11:12

## 2019-06-07 NOTE — Progress Notes (Signed)
[name], [name] ([id number]) Visit Report for 06/05/2019 Chief Complaint Document Details Patient Name: [name], [name]. Date of Service: 06/05/2019 8:00 AM Medical Record Number: [id number] Patient Account Number: [id number] Date of Birth/Sex: 07-28-1965 (54 y.o. M) Treating RN: Cornell Barman Primary Care Provider: [name] Other Clinician: Referring Provider: [name] Treating Provider/Extender: Melburn Hake, Hortense Cantrall Weeks in Treatment: 12 Information Obtained from: Patient Chief Complaint Right lateral foot ulcer Electronic Signature(s) Signed: 06/06/2019 6:04:00 PM By: [name] PA-C Entered By: [name] on 06/05/2019 08:18:10 [name] ([id number]) -------------------------------------------------------------------------------- HPI Details Patient Name: [name] Date of Service: 06/05/2019 8:00 AM Medical Record Number: [id number] Patient Account Number: [id number] Date of Birth/Sex: 1965-02-10 (54 y.o. M) Treating RN: Cornell Barman Primary Care Provider: [name] Other Clinician: Referring Provider: [name] Treating Provider/Extender: Melburn Hake, Burnis Kaser Weeks in Treatment: 12 History of Present Illness HPI Description: This 55 year old male comes with an ulcerated area on the plantar aspect of the right foot which she's had for approximately a month. I have known him from a previous visit at [facility] wound center and was treated in the months of April and May 2016 and rapidly healed a left plantar ulcer with a total contact cast. He has been a diabetic for about 16 years and tries to keep active and is fairly compliant with his diabetes management. 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 Past medical history significant for hypertension, hyperlipidemia, and status post appendectomy 1993. He does not smoke or drink alcohol. 01/14/2015 -- the patient had tolerated his total contact cast very well and had no problems  and has had no systemic symptoms. However when his total contact cast was cut open he had excessive amount of purulent drainage in spite of being on antibiotics. He had had a recent x-ray done in the ER 12/22/2014 which showed IMPRESSION:No evidence of osseous erosion. Known soft tissue ulceration is not well characterized on radiograph. Scattered vascular calcifications seen. his last hemoglobin A1c in December was 7.3. He has been on Augmentin and doxycycline for the last 2 weeks. 01/21/2015 -- his culture grew rare growth of Pantoea species an MR moderate growth of Candida parapsilosis. it is sensitive to levofloxacin. He has not heard back from the insurance company regarding his hyperbaric oxygen therapy. His MRI has not been done yet and we will try and get him an earlier date 01/28/2015 -- MRI was done last night -- IMPRESSION:1. Soft tissue ulcer overlying the plantar aspect of the fifth metatarsal head extending to the cortex. Subcortical marrow edema in the fifth metatarsal head with corresponding T1 hypointensity is concerning for early osteomyelitis of the plantar lateral aspect of the fifth metatarsal head. Chest x-ray done on 01/14/2015 shows bronchiectatic changes without infiltrate. EKG done on generally 17 2017 shows a normal sinus rhythm and is a normal EKG. 02/04/2015 -- he was asked to see Dr. [name] last week and had 2 appointments but had to cancel both due to pressures of work. Last night he has woken up with severe pain in the foot and leg and it is swollen up. No fever or no change in his blood glucose. Addendum: I spoke with Dr. [name] who kindly agreed to accept the patient for inpatient therapy and have also opened to the hospitalist Dr. [name], and discuss details of the management including PICC line and repeat cultures. 02/12/2015-- -- was seen by Dr. [name] in the hospital and a PICC line was placed. He was to receive Ceftazidime 2  g every 12 hourly,  oral levofloxacin 750 mg every 24 hourly and oral fluconazole 200 mg daily. The antibiotics were to be given for 4 weeks except the Diflucan was to be given for the first 2 weeks. Reviewed note from 02/10/2015 -- and Dr. [name] had recommended management for growth of MSSA and Serratia. He switched him from ceftazidime to ceftriaxone 2 g every 24 hours. Levofloxacin was stopped and he would continue on fluconazole for another week. He had asked me to decide whether further imaging was necessary and whether surgical debridement of the infected bone was needed. He is doing well and has been off work for this week and we will keep him off the next week. 02/22/2015 -- he was seen by my colleague on 01/19/2015 and at that time an incision and drainage was done on his right lateral forefoot on the dorsum. Today when I probed this wound it is frankly draining pus and it communicates with the ulcer on the plantar aspect of his right foot. The patient is still on IV ceftriaxone 2 g every 24 hours and is to be seen by Dr. [name] on Friday. 03/19/2015 -- On 03/04/2015 I spoke to Dr. 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 He recommended operative debridement which would probably result in the fifth metatarsal head and toe amputation.The patient would be referred back to [place] once he was done with surgery. He was admitted to [facility] yesterday and had surgery done by podiatry for a right fifth metatarsal acute osteomyelitis with cellulitis and abscess. He had a right foot incision and drainage with fifth metatarsal partial amputation and removal of toe infected bone and soft tissue with cultures. The wound was partially closed and packing of the distal end was done. Patient was already on cefepime 2 g IV every 8 hourly and put on  vancomycin pending final cultures. He had grown moderate gram-negative rods, and later found to be rare diphtheroids. I received a call from Dr. Cannon Kettle the podiatrist and we discussed the above. On 03/10/2015 he was found positive for influenza a and has been put on Tamiflu. Since his discharge he has been seen by Dr. Cannon Kettle who is planning to remove his sutures this coming week. He was reviewed by Dr. [name] on 03/17/2015 and his Diflucan was stopped and vancomycin. After the 3 doses he is taking. He is going to change Ceftazidime to Zosyn 3.375 g IV every 8 hours. 03/25/2015 -- he was seen by the podiatrist a couple of days ago and the sutures were removed. She will follow back with him in 4 weeks' time and at that time x-rays will be taken and a custom molded insert would be made for his shoe. 04/01/2015 -- he was seen by Dr. [name] on 03/29/2015 who pulled the PICC line stopped his IV antibiotics and recommended starting doxycycline and levofloxacin for 2 weeks. He also stop the fluconazole. The patient will follow up with him only when necessary. Readmission: 03/15/17 on evaluation today patient presents for initial evaluation concerning the new issues although he has previously been evaluated in our clinic. Unfortunately the previous evaluation led to the patient having to proceed to amputation and so he is somewhat nervous about being here [name], [name] ([id number]) today. With that being said he has a very  slight blister that occurred on the plantar aspect more medial on the right great toe that has been present for just a very short amount of time, several days. With that being said he felt like initially when he called that he somewhat overreacted but due to the fact that his previous issue led to amputation he is very cautious these days I explained that he did the right thing. With that being said he has been tolerating the dressing changes without complication mainly he  just been covering this. He does not have any discomfort in secondary to neuropathy it's unlikely that he feels much. With that being said he does tell me that the issue that he had here is that he went barefoot when he knows he should not have which subsequently led to the blisters. He states is definitely not doing that anymore. His most recent hemoglobin A1c was December and registered 6.9 his ABI today was 1.1. 03/27/17 on evaluation today patient appears to be doing great in regard to his right great toe ulcer. He has been tolerating the dressing changes without complication. The good news is this is making excellent progress and he seems to be caring for this in an excellent fashion. I see no evidence of breakdown that would make me concerned that he was at risk for infection/amputation. This has obviously been his concern due to the fifth toe amputation that was necessitated previous when he had a similar issue. Nonetheless this seems to be progressing much more nicely. Readmission: 03/13/2019 upon evaluation today patient presents for reevaluation here in the clinic concerning issues he has been having with his right foot on the lateral portion at the proximal end of the metatarsal. Subsequently he tells me that this just opened up in the past couple of days or so and the erythema really began about 24 to 48 hours ago. Fortunately there is no signs of systemic infection. 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 That left him with this bony prominence that is where the wound is currently. There is erythema surrounding that does have me concerned about cellulitis. With that being said he was noncompressible as far as ABIs are concerned I do think we can need to check into arterial studies at this point. Patient does have a history of hypertension along with diabetes mellitus type 2. He also tends to develop quite a bit of callus white  often. 03/20/2019 upon evaluation today patient appears to be doing very well in regard to his wounds currently. He has been tolerating the dressing changes without complication. Fortunately there is no signs of infection. His ABIs were good at this point and registered at 1.07 on the left and 1.09 on the right. In regard to the x-ray this was negative as well for any signs of osteomyelitis. The patient also seems to be doing better in regard to the infection. He still has several days of the antibiotic left at this point best the Bactrim DS and he seems to be doing very well with this. Overall I am extremely pleased with the progress in 1 week's time he is going require some sharp debridement today however. 03/27/2019 upon evaluation today patient actually appears to be making some progress in regard to his wound. It is a little deeper but measuring smaller which is good news due to the fact that again were clear away some of the necrotic tissue which is why the depth is increasing  a little bit. Nonetheless after like were getting very close to being down at a good wound bed. Fortunately there is no signs of active infection at this time. There is a little bit of erythema immediately surrounding the wound that we do want to be very cognizant of and careful about. For that reason I am going to go ahead and extend his antibiotic today for an additional 10 days that is the Bactrim. 04/03/2019 upon evaluation today patient appears to be making some progress. I do feel like the Annitta Needs is doing a good job along with the alginate is being packed into the wound space behind. He has been tolerating the dressing changes without complication. Fortunately there is no signs of active infection at this time. No fevers, chills, nausea, vomiting, or diarrhea. 04/10/2019 upon evaluation today patient appears to be making progress. The wound is not quite as deep but he still does not have a great wound surface yet for  working on this and the Santyl does seem to be cleaning things up. Fortunately there is no evidence of active infection at this time. No fevers, chills, nausea, vomiting, or diarrhea. 04/17/2019 upon evaluation today patient appears to be doing excellent at this time in regard to his foot ulcer. I do believe the Santyl with the alginate packed in behind has been of benefit for him and I am very pleased in this regard. With that being said I am feeling like the wound surface is improving quite a bit each time I see him still I do not believe we are at the point of stating that the wound bed is perfect but again were making progress. The patient is going require some sharp debridement today. 4/22; this is a patient who is using Santyl with calcium alginate. Apparently his wound dimensions have been improving. Patient is changing the dressing himself. He has a surgical shoe. He works in a sitting position therefore is not on his feet all that much. There is undermining 05/01/2019 upon evaluation today patient appears to be doing a little better in regard to the size of his wound though he still has some depth. This does seem to be much cleaner has been using Santyl on the base of the wound followed by packing with silver alginate and behind. Nonetheless I do believe that based on what we are seeing we may need to switch to a collagen type dressing endoform may actually be excellent for him. 05/08/2019 upon evaluation today patient's wound actually appears to be showing better granulation tissue in the base of the wound. With that being said he does have some epiboly noted around the edges of the wound especially plantar and more distal. Subsequently this is can require sharp debridement today. Fortunately there is no signs of active systemic infection though I do feel like there is some local cellulitis noted at this point. The patient also notes has had increased drainage. 5/13; wound volume quite a bit  improved this week down to 3 mm. Patient states pain and drainage better. Culture from last week grew staph aureus [not MRSA] and this is sensitive to quinolones and he is on Levaquin. HOWEVER he also grew abundant Enterococcus faecalis treatment of choice for this is ampicillin. Quinolone coverage is unreliable 5/20 completing the Augmentin. Small wound on the right lateral foot. Thick rolled edges around the wound. Using silver alginate 05/29/2019 upon evaluation today patient appears to be doing better compared to last time I saw him. Fortunately there is no  signs of active infection and I do feel like he is actually making good progress. Overall the quality of the wound bed along with the size looks improved and the inflammation/erythema that was previously noted by myself when I saw the couple weeks back actually has also resolved and this is great. Overall very pleased with how things stand. 06/06/2019 upon evaluation today patient actually appears to be doing quite well with regard to his foot ulcer with regard to some of the granulation were seen at this point. There does not appear to be any evidence of systemic or local infection although he still has discomfort I feel like things are moving in a slow but surely correct direction. I think we may need to help fill some of the space however even though he is using the collagen we may need to have something behind this such as a plain packing strip to try to help hold the collagen in the base of the wound. Electronic Signature(s) Signed: 06/06/2019 5:54:42 PM By: [name] PA-C Entered By: [name] on 06/06/2019 17:54:42 [name], [name] ([name] ([id number]) -------------------------------------------------------------------------------- Physical Exam Details Patient Name: [name], [name] Date of Service: 06/05/2019 8:00 AM Medical Record Number: [id number] Patient Account Number: [id number] Date  of Birth/Sex: 1965-04-13 (54 y.o. M) Treating RN: Cornell Barman Primary Care Provider: [name] Other Clinician: Referring Provider: [name] Treating Provider/Extender: Melburn Hake, Najiyah Paris Weeks in Treatment: 63 Constitutional Well-nourished and well-hydrated in no acute distress. Respiratory normal breathing without difficulty. Psychiatric this patient is able to make decisions and demonstrates good insight into disease process. Alert and Oriented x 3. pleasant and cooperative. Notes His wound bed did have some slough noted I was able to mechanically debride this away with saline gauze he actually tolerated that today without complication and post debridement wound bed appears to be doing much better which is great news. There is no signs of active infection at this time. Electronic Signature(s) Signed: 06/06/2019 5:55:21 PM By: [name] PA-C Entered By: [name] on 06/06/2019 17:55:21 [name] ([id number]) -------------------------------------------------------------------------------- Physician Orders Details Patient Name: [name] Date of Service: 06/05/2019 8:00 AM Medical Record Number: [id number] Patient Account Number: [id number] Date of Birth/Sex: 1965/06/04 (54 y.o. 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 - in office o Dial antibacterial soap,  wash wounds, rinse and pat dry prior to dressing wounds Primary Wound Dressing Wound #4 Right,Lateral Foot o Silver Collagen - moisten with normal saline o Plain packing gauze - behind collagen Secondary Dressing Wound #4 Right,Lateral Foot o Gauze and Kerlix/Conform Dressing Change Frequency Wound #4 Right,Lateral Foot o Change dressing every day. Edema Control Wound #4 Right,Lateral Foot o Patient to wear own compression stockings - wear daily Off-Loading Wound #4 Right,Lateral Foot o Open toe surgical shoe with peg assist. Electronic Signature(s) Signed: 06/05/2019 11:31:22 AM By: [name] Signed: 06/06/2019 6:04:00 PM By: [name] PA-C Entered By: [name] on 06/05/2019 08:46:08 [name] ([id number]) --------------------------------------------------------------------------------  Problem List Details Patient Name: [name], [name]. Date of Service: 06/05/2019 8:00 AM Medical Record Number: [id number] Patient Account Number: [id number] Date of Birth/Sex: 1965-11-19 (54 y.o. 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 M) Treating RN: Cornell Barman Primary Care Provider: [name] Other Clinician: Referring Provider: [name] Treating Provider/Extender: Melburn Hake, Natascha Edmonds Weeks in Treatment: 12 Subjective Chief Complaint Information obtained from Patient Right lateral foot ulcer History of Present Illness (HPI) This 54 year old male comes with an ulcerated area on the plantar aspect of the right foot which she's had for approximately a month. I have known him from a previous visit at [facility] wound center and was treated in the months of April and May 2016 and rapidly healed a left plantar ulcer with a total contact cast. He has been a diabetic for about 16 years and tries to keep active and is fairly compliant with his diabetes management. 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 Past medical history significant for hypertension, hyperlipidemia, and status post appendectomy 1993. He does not smoke or drink alcohol. 01/14/2015 -- the patient had tolerated his total contact cast very well and had no problems and has had no systemic symptoms. However when his total contact cast was cut open he had excessive amount of purulent drainage in spite of being on antibiotics. He had had a recent x-ray done in the ER 12/22/2014 which showed IMPRESSION:No evidence of osseous erosion. Known soft tissue ulceration is not well characterized on radiograph. Scattered vascular calcifications seen. his last hemoglobin A1c in December was 7.3. He has been on Augmentin and doxycycline for the last 2 weeks. 01/21/2015 -- his culture grew rare growth of Pantoea species an MR moderate growth of Candida parapsilosis. it is  sensitive to levofloxacin. He has not heard back from the insurance company regarding his hyperbaric oxygen therapy. His MRI has not been done yet and we will try and get him an earlier date 01/28/2015 -- MRI was done last night -- IMPRESSION:1. Soft tissue ulcer overlying the plantar aspect of the fifth metatarsal head extending to the cortex. Subcortical marrow edema in the fifth metatarsal head with corresponding T1 hypointensity is concerning for early osteomyelitis of the plantar lateral aspect of the fifth metatarsal head. Chest x-ray done on 01/14/2015 shows bronchiectatic changes without infiltrate. EKG done on generally 17 2017 shows a normal sinus rhythm and is a normal EKG. 02/04/2015 -- he was asked to see Dr. [name] last week and had 2 appointments but had to cancel both due to pressures of work. Last night he has woken up with severe pain in the foot and leg and it is swollen up.  No fever or no change in his blood glucose. Addendum: I spoke with Dr. [name] who kindly agreed to accept the patient for inpatient therapy and have also opened to the hospitalist Dr. [name], and discuss details of the management including PICC line and repeat cultures. 02/12/2015-- -- was seen by Dr. [name] in the hospital and a PICC line was placed. He was to receive Ceftazidime 2 g every 12 hourly, oral levofloxacin 750 mg every 24 hourly and oral fluconazole 200 mg daily. The antibiotics were to be given for 4 weeks except the Diflucan was to be given for the first 2 weeks. Reviewed note from 02/10/2015 -- and Dr. [name] had recommended management for growth of MSSA and Serratia. He switched him from ceftazidime to ceftriaxone 2 g every 24 hours. Levofloxacin was stopped and he would continue on fluconazole for another week. He had asked me to decide whether further imaging was necessary and whether surgical debridement of the infected bone was needed. He is doing well and has been off work  for this week and we will keep him off the next week. 02/22/2015 -- he was seen by my colleague on 01/19/2015 and at that time an incision and drainage was done on his right lateral forefoot on the dorsum. Today when I probed this wound it is frankly draining pus and it communicates with the ulcer on the plantar aspect of his right foot. The patient is still on IV ceftriaxone 2 g every 24 hours and is to be seen by Dr. [name] on Friday. 03/19/2015 -- On 03/04/2015 I spoke to Dr. 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 He recommended operative debridement which would probably result in the fifth metatarsal head and toe amputation.The patient would be referred back to [place] once he was done with surgery. 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 He had a right foot incision and drainage with fifth metatarsal partial amputation and removal of toe infected bone and soft tissue with cultures. The wound was partially closed and packing of the distal end was done. Patient was already on cefepime 2 g IV every 8 hourly and put on vancomycin pending final cultures. He had grown moderate gram-negative rods, and later found to be rare diphtheroids. I received a call from Dr. Cannon Kettle the podiatrist and we discussed the above. On 03/10/2015 he was found positive for influenza a and has been put on Tamiflu. Since his discharge he has been seen by Dr. Cannon Kettle who is planning to remove his sutures this coming week. He was reviewed by Dr. [name] on 03/17/2015 and his Diflucan was stopped and vancomycin. After the 3 doses he is taking. He is going to change Ceftazidime to Zosyn 3.375 g IV every 8 hours. 03/25/2015 -- he was seen by the  podiatrist a couple of days ago and the sutures were removed. She will follow back with him in 4 weeks' time and at that time x-rays will be taken and a custom molded insert would be made for his shoe. 04/01/2015 -- he was seen by Dr. [name] on 03/29/2015 who pulled the PICC line stopped his IV antibiotics and recommended starting doxycycline and levofloxacin for 2 weeks. He also stop the fluconazole.  The patient will follow up with him only when necessary. [name], [name] ([id number]) Readmission: 03/15/17 on evaluation today patient presents for initial evaluation concerning the new issues although he has previously been evaluated in our clinic. Unfortunately the previous evaluation led to the patient having to proceed to amputation and so he is somewhat nervous about being here today. With that being said he has a very slight blister that occurred on the plantar aspect more medial on the right great toe that has been present for just a very short amount of time, several days. With that being said he felt like initially when he called that he somewhat overreacted but due to the fact that his previous issue led to amputation he is very cautious these days I explained that he did the right thing. With that being said he has been tolerating the dressing changes without complication mainly he just been covering this. He does not have any discomfort in secondary to neuropathy it's unlikely that he feels much. With that being said he does tell me that the issue that he had here is that he went barefoot when he knows he should not have which subsequently led to the blisters. He states is definitely not doing that anymore. His most recent hemoglobin A1c was December and registered 6.9 his ABI today was 1.1. 03/27/17 on evaluation today patient appears to be doing great in regard to his right great toe ulcer. He has been tolerating the dressing changes without complication. The good news is this is  making excellent progress and he seems to be caring for this in an excellent fashion. I see no evidence of breakdown that would make me concerned that he was at risk for infection/amputation. This has obviously been his concern due to the fifth toe amputation that was necessitated previous when he had a similar issue. Nonetheless this seems to be progressing much more nicely. Readmission: 03/13/2019 upon evaluation today patient presents for reevaluation here in the clinic concerning issues he has been having with his right foot on the lateral portion at the proximal end of the metatarsal. Subsequently he tells me that this just opened up in the past couple of days or so and the erythema really began about 24 to 48 hours ago. Fortunately there is no signs of systemic infection. 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 That left him with this bony prominence that is where the wound is currently. There is erythema surrounding that does have me concerned about cellulitis. With that being said he was noncompressible as far as ABIs are concerned I do think we can need to check into arterial studies at this point. Patient does have a history of hypertension along with diabetes mellitus type 2. He also tends to develop quite a bit of callus white often. 03/20/2019 upon evaluation today patient appears to be doing very well in regard to his wounds currently. He has been tolerating the dressing changes without complication. Fortunately there is no signs of infection. His ABIs were good at this point and registered at 1.07 on the left and 1.09 on the right. In regard to the x-ray this was negative as well for any signs of osteomyelitis. The patient also seems to be doing better in regard to the infection. He still has several days of the antibiotic left at this point best the Bactrim DS and he seems to be doing very well with this. Overall I am extremely  pleased  with the progress in 1 week's time he is going require some sharp debridement today however. 03/27/2019 upon evaluation today patient actually appears to be making some progress in regard to his wound. It is a little deeper but measuring smaller which is good news due to the fact that again were clear away some of the necrotic tissue which is why the depth is increasing a little bit. Nonetheless after like were getting very close to being down at a good wound bed. Fortunately there is no signs of active infection at this time. There is a little bit of erythema immediately surrounding the wound that we do want to be very cognizant of and careful about. For that reason I am going to go ahead and extend his antibiotic today for an additional 10 days that is the Bactrim. 04/03/2019 upon evaluation today patient appears to be making some progress. I do feel like the Annitta Needs is doing a good job along with the alginate is being packed into the wound space behind. He has been tolerating the dressing changes without complication. Fortunately there is no signs of active infection at this time. No fevers, chills, nausea, vomiting, or diarrhea. 04/10/2019 upon evaluation today patient appears to be making progress. The wound is not quite as deep but he still does not have a great wound surface yet for working on this and the Santyl does seem to be cleaning things up. Fortunately there is no evidence of active infection at this time. No fevers, chills, nausea, vomiting, or diarrhea. 04/17/2019 upon evaluation today patient appears to be doing excellent at this time in regard to his foot ulcer. I do believe the Santyl with the alginate packed in behind has been of benefit for him and I am very pleased in this regard. With that being said I am feeling like the wound surface is improving quite a bit each time I see him still I do not believe we are at the point of stating that the wound bed is perfect but again were making  progress. The patient is going require some sharp debridement today. 4/22; this is a patient who is using Santyl with calcium alginate. Apparently his wound dimensions have been improving. Patient is changing the dressing himself. He has a surgical shoe. He works in a sitting position therefore is not on his feet all that much. There is undermining 05/01/2019 upon evaluation today patient appears to be doing a little better in regard to the size of his wound though he still has some depth. This does seem to be much cleaner has been using Santyl on the base of the wound followed by packing with silver alginate and behind. Nonetheless I do believe that based on what we are seeing we may need to switch to a collagen type dressing endoform may actually be excellent for him. 05/08/2019 upon evaluation today patient's wound actually appears to be showing better granulation tissue in the base of the wound. With that being said he does have some epiboly noted around the edges of the wound especially plantar and more distal. Subsequently this is can require sharp debridement today. Fortunately there is no signs of active systemic infection though I do feel like there is some local cellulitis noted at this point. The patient also notes has had increased drainage. 5/13; wound volume quite a bit improved this week down to 3 mm. Patient states pain and drainage better. Culture from last week grew staph aureus [not MRSA] and this is  sensitive to quinolones and he is on Levaquin. HOWEVER he also grew abundant Enterococcus faecalis treatment of choice for this is ampicillin. Quinolone coverage is unreliable 5/20 completing the Augmentin. Small wound on the right lateral foot. Thick rolled edges around the wound. Using silver alginate 05/29/2019 upon evaluation today patient appears to be doing better compared to last time I saw him. Fortunately there is no signs of active infection and I do feel like he is actually  making good progress. Overall the quality of the wound bed along with the size looks improved and the inflammation/erythema that was previously noted by myself when I saw the couple weeks back actually has also resolved and this is great. Overall very pleased with how things stand. 06/06/2019 upon evaluation today patient actually appears to be doing quite well with regard to his foot ulcer with regard to some of the granulation were seen at this point. There does not appear to be any evidence of systemic or local infection although he still has discomfort I feel like things are moving in a slow but surely correct direction. I think we may need to help fill some of the space however even though he is using the collagen we may need to have something behind this such as a plain packing strip to try to help hold the collagen in the base of the wound. [name], [name] ([id number]) Objective Constitutional Well-nourished and well-hydrated in no acute distress. Vitals Time Taken: 8:10 AM, Height: 74 in, Weight: 308 lbs, BMI: 39.5, Temperature: 98.1 F, Pulse: 79 bpm, Respiratory Rate: 16 breaths/min, Blood Pressure: 153/58 mmHg. Respiratory normal breathing without difficulty. Psychiatric this patient is able to make decisions and demonstrates good insight into disease process. Alert and Oriented x 3. pleasant and cooperative. General Notes: His wound bed did have some slough noted I was able to mechanically debride this away with saline gauze he actually tolerated that today without complication and post debridement wound bed appears to be doing much better which is great news. There is no signs of active infection at this time. Integumentary (Hair, Skin) Wound #4 status is Open. Original cause of wound was Gradually Appeared. The wound is located on the Right,Lateral Foot. The wound measures 0.9cm length x 0.5cm width x 0.6cm depth; 0.353cm^2 area and 0.212cm^3 volume. There is Fat Layer  (Subcutaneous Tissue) Exposed exposed. There is no tunneling or undermining noted. There is a medium amount of serous drainage noted. The wound margin is flat and intact. There is medium (34-66%) pink granulation within the wound bed. There is a medium (34-66%) amount of necrotic tissue within the wound bed including Adherent Slough. Assessment Active Problems ICD-10 Type 2 diabetes mellitus with foot ulcer 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d Cellulitis of right lower limb Corns and callosities Essential (primary) hypertension Acquired absence of other left toe(s) Plan Wound Cleansing: Wound #4 Right,Lateral Foot: Clean wound with Normal Saline. - in office Dial antibacterial soap, wash wounds, rinse and pat dry prior to dressing wounds Primary Wound Dressing: Wound #4 Right,Lateral Foot: Silver Collagen - moisten with normal saline Plain packing gauze - behind collagen Secondary Dressing: Wound #4 Right,Lateral Foot: Gauze and Kerlix/Conform Dressing Change Frequency: Wound #4 Right,Lateral Foot: Change dressing every day. Edema Control: Wound #4 Right,Lateral Foot: Patient to wear own compression stockings - wear daily Off-Loading: Wound #4 Right,Lateral Foot: Open toe surgical shoe with peg assist. [name], [name]. ([id number]) 1 I am going to recommend at  this point that the patient go ahead and continue with the silver collagen dressing we will use plain packing strip and behind to hold this to the base of the wound. 2. I am also can recommend at this time that we continue to monitor for any signs of worsening or ongoing inflammation/infection. My hope is he will continue to granulate in at this point. 3. I would recommend he continue with the open toe surgical shoe with peg assist. We will see patient back for reevaluation in 1 week here in the clinic. If anything worsens or changes patient will contact our office for  additional recommendations. Electronic Signature(s) Signed: 06/06/2019 5:55:49 PM By: [name] PA-C Entered By: [name] on 06/06/2019 17:55:48 [name] ([id number]) -------------------------------------------------------------------------------- SuperBill Details Patient Name: [name] Date of Service: 06/05/2019 Medical Record Number: [id number] Patient Account Number: [id number] Date of Birth/Sex: September 17, 1965 (54 y.o. M) Treating RN: Cornell Barman Primary Care Provider: [name] Other Clinician: Referring Provider: [name] Treating Provider/Extender: Melburn Hake, Irish Breisch Weeks in Treatment: 12 Diagnosis Coding ICD-10 Codes Code Description E11.621 Type 2 diabetes mellitus with foot ulcer L97.512 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d L03.115 Cellulitis of right lower limb L84 Corns and callosities I10 Essential (primary) hypertension Z89.422 Acquired absence of other left toe(s) Facility Procedures CPT4 Code: 93570177 Description: 99213 - WOUND CARE VISIT-LEV 3 EST PT Modifier: Quantity: 1 Physician Procedures CPT4 Code: 9390300 Description: 92330 - WC PHYS LEVEL 3 - EST PT Modifier: Quantity: 1 CPT4 Code: Description: ICD-10 Diagnosis Description E11.621 Type 2 diabetes mellitus with foot ulcer L97.512 Non-pressure chronic ulcer of other part of right foot with fat layer e L03.115 Cellulitis of right lower limb L84 Corns and callosities Modifier: xposed Quantity: Electronic Signature(s) Signed: 06/05/2019 6:02:28 PM By: [name] PA-C Entered By: [name] on 06/05/2019 18:02:27

## 2019-06-12 ENCOUNTER — Encounter: Payer: Managed Care, Other (non HMO) | Admitting: Physician Assistant

## 2019-06-12 ENCOUNTER — Other Ambulatory Visit: Payer: Self-pay

## 2019-06-12 DIAGNOSIS — E11621 Type 2 diabetes mellitus with foot ulcer: Secondary | ICD-10-CM | POA: Diagnosis not present

## 2019-06-12 NOTE — Progress Notes (Addendum)
MALICHI, PALARDY ([id number]) Visit Report for 06/12/2019 Chief Complaint Document Details Patient Name: [name], [name]. Date of Service: 06/12/2019 8:45 AM Medical Record Number: [id number] Patient Account Number: [id number] Date of Birth/Sex: May 08, 1965 (54 y.o. M) Treating RN: [name] Primary Care Provider: [name] Other Clinician: Referring Provider: [name] Treating Provider/Extender: Melburn Hake, Raysha Tilmon Weeks in Treatment: 13 Information Obtained from: Patient Chief Complaint Right lateral foot ulcer Electronic Signature(s) Signed: 06/12/2019 8:44:27 AM By: [name] PA-C Entered By: [name] on 06/12/2019 08:44:27 [name] ([id number]) -------------------------------------------------------------------------------- HPI Details Patient Name: [name] Date of Service: 06/12/2019 8:45 AM Medical Record Number: [id number] Patient Account Number: [id number] Date of Birth/Sex: [birth year] (54 y.o. M) Treating RN: [name] Primary Care Provider: [name] Other Clinician: Referring Provider: [name] Treating Provider/Extender: STONE III, Khalifa Knecht Weeks in Treatment: 13 History of Present Illness HPI Description: This 54 year old male comes with an ulcerated area on the plantar aspect of the right foot which she's had for approximately a month. I have known him from a previous visit at [facility] wound center and was treated in the months of April and May 2016 and rapidly healed a left plantar ulcer with a total contact cast. He has been a diabetic for about 16 years and tries to keep active and is fairly compliant with his diabetes management. 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 Past medical history significant for hypertension, hyperlipidemia, and status post appendectomy 1993. He does not smoke or drink alcohol. 01/14/2015 -- the patient had tolerated his total contact cast very well and had no  problems and has had no systemic symptoms. However when his total contact cast was cut open he had excessive amount of purulent drainage in spite of being on antibiotics. He had had a recent x-ray done in the ER 12/22/2014 which showed IMPRESSION:No evidence of osseous erosion. Known soft tissue ulceration is not well characterized on radiograph. Scattered vascular calcifications seen. his last hemoglobin A1c in December was 7.3. He has been on Augmentin and doxycycline for the last 2 weeks. 01/21/2015 -- his culture grew rare growth of Pantoea species an MR moderate growth of Candida parapsilosis. it is sensitive to levofloxacin. He has not heard back from the insurance company regarding his hyperbaric oxygen therapy. His MRI has not been done yet and we will try and get him an earlier date 01/28/2015 -- MRI was done last night -- IMPRESSION:1. Soft tissue ulcer overlying the plantar aspect of the fifth metatarsal head extending to the cortex. Subcortical marrow edema in the fifth metatarsal head with corresponding T1 hypointensity is concerning for early osteomyelitis of the plantar lateral aspect of the fifth metatarsal head. Chest x-ray done on 01/14/2015 shows bronchiectatic changes without infiltrate. EKG done on generally 17 2017 shows a normal sinus rhythm and is a normal EKG. 02/04/2015 -- he was asked to see Dr. [name] last week and had 2 appointments but had to cancel both due to pressures of work. Last night he has woken up with severe pain in the foot and leg and it is swollen up. No fever or no change in his blood glucose. Addendum: I spoke with Dr. [name] who kindly agreed to accept the patient for inpatient therapy and have also opened to the hospitalist Dr. [name], and discuss details of the management including PICC line and repeat cultures. 02/12/2015-- -- was seen by Dr. [name] in the hospital and a PICC line was placed. He was to receive Ceftazidime 2  g every 12  hourly, oral levofloxacin 750 mg every 24 hourly and oral fluconazole 200 mg daily. The antibiotics were to be given for 4 weeks except the Diflucan was to be given for the first 2 weeks. Reviewed note from 02/10/2015 -- and Dr. [name] had recommended management for growth of MSSA and Serratia. He switched him from ceftazidime to ceftriaxone 2 g every 24 hours. Levofloxacin was stopped and he would continue on fluconazole for another week. He had asked me to decide whether further imaging was necessary and whether surgical debridement of the infected bone was needed. He is doing well and has been off work for this week and we will keep him off the next week. 02/22/2015 -- he was seen by my colleague on 01/19/2015 and at that time an incision and drainage was done on his right lateral forefoot on the dorsum. Today when I probed this wound it is frankly draining pus and it communicates with the ulcer on the plantar aspect of his right foot. The patient is still on IV ceftriaxone 2 g every 24 hours and is to be seen by Dr. [name] on Friday. 03/19/2015 -- On 03/04/2015 I spoke to Dr. 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 He recommended operative debridement which would probably result in the fifth metatarsal head and toe amputation.The patient would be referred back to [place] once he was done with surgery. He was admitted to [facility] yesterday and had surgery done by podiatry for a right fifth metatarsal acute osteomyelitis with cellulitis and abscess. He had a right foot incision and drainage with fifth metatarsal partial amputation and removal of toe infected bone and soft tissue with cultures. The wound was partially closed and packing of the distal end was done. Patient was already on cefepime 2 g IV every 8 hourly and put  on vancomycin pending final cultures. He had grown moderate gram-negative rods, and later found to be rare diphtheroids. I received a call from Dr. Cannon Kettle the podiatrist and we discussed the above. On 03/10/2015 he was found positive for influenza a and has been put on Tamiflu. Since his discharge he has been seen by Dr. Cannon Kettle who is planning to remove his sutures this coming week. He was reviewed by Dr. [name] on 03/17/2015 and his Diflucan was stopped and vancomycin. After the 3 doses he is taking. He is going to change Ceftazidime to Zosyn 3.375 g IV every 8 hours. 03/25/2015 -- he was seen by the podiatrist a couple of days ago and the sutures were removed. She will follow back with him in 4 weeks' time and at that time x-rays will be taken and a custom molded insert would be made for his shoe. 04/01/2015 -- he was seen by Dr. [name] on 03/29/2015 who pulled the PICC line stopped his IV antibiotics and recommended starting doxycycline and levofloxacin for 2 weeks. He also stop the fluconazole. The patient will follow up with him only when necessary. Readmission: 03/15/17 on evaluation today patient presents for initial evaluation concerning the new issues although he has previously been evaluated in our clinic. Unfortunately the previous evaluation led to the patient having to proceed to amputation and so he is somewhat nervous about being here VINEETH, FELL ([id number]) today. With that being said he has a very  slight blister that occurred on the plantar aspect more medial on the right great toe that has been present for just a very short amount of time, several days. With that being said he felt like initially when he called that he somewhat overreacted but due to the fact that his previous issue led to amputation he is very cautious these days I explained that he did the right thing. With that being said he has been tolerating the dressing changes without complication mainly  he just been covering this. He does not have any discomfort in secondary to neuropathy it's unlikely that he feels much. With that being said he does tell me that the issue that he had here is that he went barefoot when he knows he should not have which subsequently led to the blisters. He states is definitely not doing that anymore. His most recent hemoglobin A1c was December and registered 6.9 his ABI today was 1.1. 03/27/17 on evaluation today patient appears to be doing great in regard to his right great toe ulcer. He has been tolerating the dressing changes without complication. The good news is this is making excellent progress and he seems to be caring for this in an excellent fashion. I see no evidence of breakdown that would make me concerned that he was at risk for infection/amputation. This has obviously been his concern due to the fifth toe amputation that was necessitated previous when he had a similar issue. Nonetheless this seems to be progressing much more nicely. Readmission: 03/13/2019 upon evaluation today patient presents for reevaluation here in the clinic concerning issues he has been having with his right foot on the lateral portion at the proximal end of the metatarsal. Subsequently he tells me that this just opened up in the past couple of days or so and the erythema really began about 24 to 48 hours ago. Fortunately there is no signs of systemic infection. 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 That left him with this bony prominence that is where the wound is currently. There is erythema surrounding that does have me concerned about cellulitis. With that being said he was noncompressible as far as ABIs are concerned I do think we can need to check into arterial studies at this point. Patient does have a history of hypertension along with diabetes mellitus type 2. He also tends to develop quite a bit of callus white  often. 03/20/2019 upon evaluation today patient appears to be doing very well in regard to his wounds currently. He has been tolerating the dressing changes without complication. Fortunately there is no signs of infection. His ABIs were good at this point and registered at 1.07 on the left and 1.09 on the right. In regard to the x-ray this was negative as well for any signs of osteomyelitis. The patient also seems to be doing better in regard to the infection. He still has several days of the antibiotic left at this point best the Bactrim DS and he seems to be doing very well with this. Overall I am extremely pleased with the progress in 1 week's time he is going require some sharp debridement today however. 03/27/2019 upon evaluation today patient actually appears to be making some progress in regard to his wound. It is a little deeper but measuring smaller which is good news due to the fact that again were clear away some of the necrotic tissue which is why the depth is increasing  a little bit. Nonetheless after like were getting very close to being down at a good wound bed. Fortunately there is no signs of active infection at this time. There is a little bit of erythema immediately surrounding the wound that we do want to be very cognizant of and careful about. For that reason I am going to go ahead and extend his antibiotic today for an additional 10 days that is the Bactrim. 04/03/2019 upon evaluation today patient appears to be making some progress. I do feel like the Annitta Needs is doing a good job along with the alginate is being packed into the wound space behind. He has been tolerating the dressing changes without complication. Fortunately there is no signs of active infection at this time. No fevers, chills, nausea, vomiting, or diarrhea. 04/10/2019 upon evaluation today patient appears to be making progress. The wound is not quite as deep but he still does not have a great wound surface yet for  working on this and the Santyl does seem to be cleaning things up. Fortunately there is no evidence of active infection at this time. No fevers, chills, nausea, vomiting, or diarrhea. 04/17/2019 upon evaluation today patient appears to be doing excellent at this time in regard to his foot ulcer. I do believe the Santyl with the alginate packed in behind has been of benefit for him and I am very pleased in this regard. With that being said I am feeling like the wound surface is improving quite a bit each time I see him still I do not believe we are at the point of stating that the wound bed is perfect but again were making progress. The patient is going require some sharp debridement today. 4/22; this is a patient who is using Santyl with calcium alginate. Apparently his wound dimensions have been improving. Patient is changing the dressing himself. He has a surgical shoe. He works in a sitting position therefore is not on his feet all that much. There is undermining 05/01/2019 upon evaluation today patient appears to be doing a little better in regard to the size of his wound though he still has some depth. This does seem to be much cleaner has been using Santyl on the base of the wound followed by packing with silver alginate and behind. Nonetheless I do believe that based on what we are seeing we may need to switch to a collagen type dressing endoform may actually be excellent for him. 05/08/2019 upon evaluation today patient's wound actually appears to be showing better granulation tissue in the base of the wound. With that being said he does have some epiboly noted around the edges of the wound especially plantar and more distal. Subsequently this is can require sharp debridement today. Fortunately there is no signs of active systemic infection though I do feel like there is some local cellulitis noted at this point. The patient also notes has had increased drainage. 5/13; wound volume quite a bit  improved this week down to 3 mm. Patient states pain and drainage better. Culture from last week grew staph aureus [not MRSA] and this is sensitive to quinolones and he is on Levaquin. HOWEVER he also grew abundant Enterococcus faecalis treatment of choice for this is ampicillin. Quinolone coverage is unreliable 5/20 completing the Augmentin. Small wound on the right lateral foot. Thick rolled edges around the wound. Using silver alginate 05/29/2019 upon evaluation today patient appears to be doing better compared to last time I saw him. Fortunately there is no  signs of active infection and I do feel like he is actually making good progress. Overall the quality of the wound bed along with the size looks improved and the inflammation/erythema that was previously noted by myself when I saw the couple weeks back actually has also resolved and this is great. Overall very pleased with how things stand. 06/06/2019 upon evaluation today patient actually appears to be doing quite well with regard to his foot ulcer with regard to some of the granulation were seen at this point. There does not appear to be any evidence of systemic or local infection although he still has discomfort I feel like things are moving in a slow but surely correct direction. I think we may need to help fill some of the space however even though he is using the collagen we may need to have something behind this such as a plain packing strip to try to help hold the collagen in the base of the wound. 06/12/2019 upon evaluation today patient appears to be doing well with regard to his foot ulcer. This seems to be making some slow but steady progress. The wound does not appear to be as deep today compared to where it was previous. Fortunately there is no signs of active infection at this time. No fevers, chills, nausea, vomiting, or diarrhea. Electronic Signature(s) MARCH, STEYER ([id number]) Signed: 06/12/2019 9:29:59 AM By: [name] PA-C Entered By: [name] on 06/12/2019 09:29:59 NICCO, REAUME ([id number]) -------------------------------------------------------------------------------- Physical Exam Details Patient Name: JAVANTE, [name] Date of Service: 06/12/2019 8:45 AM Medical Record Number: [id number] Patient Account Number: [id number] Date of Birth/Sex: [birth year] (54 y.o. M) Treating RN: [name] Primary Care Provider: [name] Other Clinician: Referring Provider: [name] Treating Provider/Extender: STONE III, Shakea Isip Weeks in Treatment: 20 Constitutional Well-nourished and well-hydrated in no acute distress. Respiratory normal breathing without difficulty. Psychiatric this patient is able to make decisions and demonstrates good insight into disease process. Alert and Oriented x 3. pleasant and cooperative. Notes Upon inspection patient's wound bed did not require sharp debridement today but I was able to clean out mechanically with saline and gauze and a Q-tip the slough noted in the surface of the wound internally. The wound does appear to be doing better I feel like we are making some progress here. Electronic Signature(s) Signed: 06/12/2019 9:30:16 AM By: [name] PA-C Entered By: [name] on 06/12/2019 09:30:16 [name] ([id number]) -------------------------------------------------------------------------------- Physician Orders Details Patient Name: [name] Date of Service: 06/12/2019 8:45 AM Medical Record Number: [id number] Patient Account Number: [id number] Date of Birth/Sex: [birth year] (54 y.o. 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 - in office o Dial antibacterial soap, wash wounds, rinse and pat dry prior to dressing wounds Primary Wound Dressing Wound #4 Right,Lateral Foot o Silver Collagen - moisten with normal saline o Plain packing gauze - behind collagen Secondary Dressing Wound #4 Right,Lateral Foot o Gauze and Kerlix/Conform Dressing Change Frequency Wound #4 Right,Lateral Foot o  Change dressing every other day. Edema Control Wound #4 Right,Lateral Foot o Patient to wear own compression stockings - wear daily Off-Loading Wound #4 Right,Lateral Foot o Open toe surgical shoe with peg assist. Electronic Signature(s) Signed: 06/12/2019 11:13:05 AM By: [name] Signed: 06/12/2019 4:30:42 PM By: [name] PA-C Entered By: [name] on 06/12/2019 09:06:02 [name] ([id number]) -------------------------------------------------------------------------------- Problem List Details Patient Name: [name], [name]. Date of Service: 06/12/2019 8:45 AM Medical Record Number: [id number] Patient Account Number: [id number] Date of Birth/Sex: 1965-04-06 (54 y.o. M) Treating RN: [name] Primary Care Provider: [name] Other Clinician: Referring Provider: [name] Treating Provider/Extender: Melburn Hake, Clarissa Laird Weeks in Treatment: 13 Active Problems ICD-10 Encounter Code Description Active Date MDM Diagnosis E11.621 Type 2 diabetes mellitus with foot ulcer 03/13/2019 No Yes L97.512 Non-pressure chronic ulcer of other part of right foot with fat layer 03/13/2019 No Yes exposed L03.115 Cellulitis of right lower limb 05/15/2019 No Yes L84 Corns and callosities  03/13/2019 No Yes I10 Essential (primary) hypertension 03/13/2019 No Yes 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 M) Treating RN: [name] Primary Care Provider: [name] Other Clinician: Referring Provider: [name] Treating Provider/Extender: Melburn Hake, Kalimah Capurro Weeks in Treatment: 13 Subjective Chief Complaint Information obtained from Patient Right lateral foot ulcer History of Present Illness (HPI) This 54 year old male comes with an ulcerated area on the plantar aspect of the right foot which she's had for approximately a month. I have known him from a previous visit at [facility] wound center and was treated in the months of April and May 2016 and rapidly healed a left plantar ulcer with a total contact cast. He has been a diabetic for about 16 years and tries to keep active and is fairly compliant with his diabetes management. 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 Past medical history significant for hypertension, hyperlipidemia, and status post appendectomy 1993. He does not smoke or drink alcohol. 01/14/2015 -- the patient had tolerated his total contact cast very well and had no problems and has had no systemic symptoms. However when his total contact cast was cut open he had excessive amount of purulent drainage in spite of being on antibiotics. He had had a recent x-ray done in the ER 12/22/2014 which showed IMPRESSION:No evidence of osseous erosion. Known soft  tissue ulceration is not well characterized on radiograph. Scattered vascular calcifications seen. his last hemoglobin A1c in December was 7.3. He has been on Augmentin and doxycycline for the last 2 weeks. 01/21/2015 -- his culture grew rare growth of Pantoea species an MR moderate growth of Candida parapsilosis. it is sensitive to levofloxacin. He has not heard back from the insurance company regarding his hyperbaric oxygen therapy. His MRI has not been done yet and we will try and get him an earlier date 01/28/2015 -- MRI was done last night -- IMPRESSION:1. Soft tissue ulcer overlying the plantar aspect of the fifth metatarsal head extending to the cortex. Subcortical marrow edema in the fifth metatarsal head with corresponding T1 hypointensity is concerning for early osteomyelitis of the plantar lateral aspect of the fifth metatarsal head. Chest x-ray done on 01/14/2015 shows  bronchiectatic changes without infiltrate. EKG done on generally 17 2017 shows a normal sinus rhythm and is a normal EKG. 02/04/2015 -- he was asked to see Dr. [name] last week and had 2 appointments but had to cancel both due to pressures of work. Last night he has woken up with severe pain in the foot and leg and it is swollen up. No fever or no change in his blood glucose. Addendum: I spoke with Dr. [name] who kindly agreed to accept the patient for inpatient therapy and have also opened to the hospitalist Dr. [name], and discuss details of the management including PICC line and repeat cultures. 02/12/2015-- -- was seen by Dr. [name] in the hospital and a PICC line was placed. He was to receive Ceftazidime 2 g every 12 hourly, oral levofloxacin 750 mg every 24 hourly and oral fluconazole 200 mg daily. The antibiotics were to be given for 4 weeks except the Diflucan was to be given for the first 2 weeks. Reviewed note from 02/10/2015 -- and Dr. [name] had recommended management for growth of MSSA and  Serratia. He switched him from ceftazidime to ceftriaxone 2 g every 24 hours. Levofloxacin was stopped and he would continue on fluconazole for another week. He had asked me to decide whether further imaging was necessary and whether surgical debridement of the infected bone was needed. He is doing well and has been off work for this week and we will keep him off the next week. 02/22/2015 -- he was seen by my colleague on 01/19/2015 and at that time an incision and drainage was done on his right lateral forefoot on the dorsum. Today when I probed this wound it is frankly draining pus and it communicates with the ulcer on the plantar aspect of his right foot. The patient is still on IV ceftriaxone 2 g every 24 hours and is to be seen by Dr. [name] on Friday. 03/19/2015 -- On 03/04/2015 I spoke to Dr. 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 He recommended operative debridement which would probably result in the fifth metatarsal head and toe amputation.The patient would be referred back to [place] once he was done with surgery. He was admitted to [facility] yesterday and had surgery done by podiatry for a right fifth metatarsal acute osteomyelitis with cellulitis and abscess. He had a right foot incision and drainage with fifth metatarsal partial amputation and removal of toe infected bone and soft tissue with cultures. The wound was partially closed and packing of the distal end was done. Patient was already on cefepime 2 g IV every 8 hourly and put on vancomycin pending final cultures. He had grown moderate gram-negative rods, and later found to be rare diphtheroids. I received a call from Dr. Cannon Kettle the podiatrist and we discussed the above. On 03/10/2015 he was found positive for influenza a and has been put on Tamiflu. Since his  discharge he has been seen by Dr. Cannon Kettle who is planning to remove his sutures this coming week. He was reviewed by Dr. [name] on 03/17/2015 and his Diflucan was stopped and vancomycin. After the 3 doses he is taking. He is going to change Ceftazidime to Zosyn 3.375 g IV every 8 hours. 03/25/2015 -- he was seen by the podiatrist a couple of days ago and the sutures  were removed. She will follow back with him in 4 weeks' time and at that time x-rays will be taken and a custom molded insert would be made for his shoe. 04/01/2015 -- he was seen by Dr. [name] on 03/29/2015 who pulled the PICC line stopped his IV antibiotics and recommended starting doxycycline and levofloxacin for 2 weeks. He also stop the fluconazole. The patient will follow up with him only when necessary. KAYSEN, DEAL ([id number]) Readmission: 03/15/17 on evaluation today patient presents for initial evaluation concerning the new issues although he has previously been evaluated in our clinic. Unfortunately the previous evaluation led to the patient having to proceed to amputation and so he is somewhat nervous about being here today. With that being said he has a very slight blister that occurred on the plantar aspect more medial on the right great toe that has been present for just a very short amount of time, several days. With that being said he felt like initially when he called that he somewhat overreacted but due to the fact that his previous issue led to amputation he is very cautious these days I explained that he did the right thing. With that being said he has been tolerating the dressing changes without complication mainly he just been covering this. He does not have any discomfort in secondary to neuropathy it's unlikely that he feels much. With that being said he does tell me that the issue that he had here is that he went barefoot when he knows he should not have which subsequently led to the blisters. He  states is definitely not doing that anymore. His most recent hemoglobin A1c was December and registered 6.9 his ABI today was 1.1. 03/27/17 on evaluation today patient appears to be doing great in regard to his right great toe ulcer. He has been tolerating the dressing changes without complication. The good news is this is making excellent progress and he seems to be caring for this in an excellent fashion. I see no evidence of breakdown that would make me concerned that he was at risk for infection/amputation. This has obviously been his concern due to the fifth toe amputation that was necessitated previous when he had a similar issue. Nonetheless this seems to be progressing much more nicely. Readmission: 03/13/2019 upon evaluation today patient presents for reevaluation here in the clinic concerning issues he has been having with his right foot on the lateral portion at the proximal end of the metatarsal. Subsequently he tells me that this just opened up in the past couple of days or so and the erythema really began about 24 to 48 hours ago. Fortunately there is no signs of systemic infection. 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 That left him with this bony prominence that is where the wound is currently. There is erythema surrounding that does have me concerned about cellulitis. With that being said he was noncompressible as far as ABIs are concerned I do think we can need to check into arterial studies at this point. Patient does have a history of hypertension along with diabetes mellitus type 2. He also tends to develop quite a bit of callus white often. 03/20/2019 upon evaluation today patient appears to be doing very well in regard to his wounds currently. He has been tolerating the dressing changes without complication. Fortunately there is no signs of infection. His ABIs were good at this point and registered at 1.07 on the  left and 1.09  on the right. In regard to the x-ray this was negative as well for any signs of osteomyelitis. The patient also seems to be doing better in regard to the infection. He still has several days of the antibiotic left at this point best the Bactrim DS and he seems to be doing very well with this. Overall I am extremely pleased with the progress in 1 week's time he is going require some sharp debridement today however. 03/27/2019 upon evaluation today patient actually appears to be making some progress in regard to his wound. It is a little deeper but measuring smaller which is good news due to the fact that again were clear away some of the necrotic tissue which is why the depth is increasing a little bit. Nonetheless after like were getting very close to being down at a good wound bed. Fortunately there is no signs of active infection at this time. There is a little bit of erythema immediately surrounding the wound that we do want to be very cognizant of and careful about. For that reason I am going to go ahead and extend his antibiotic today for an additional 10 days that is the Bactrim. 04/03/2019 upon evaluation today patient appears to be making some progress. I do feel like the Annitta Needs is doing a good job along with the alginate is being packed into the wound space behind. He has been tolerating the dressing changes without complication. Fortunately there is no signs of active infection at this time. No fevers, chills, nausea, vomiting, or diarrhea. 04/10/2019 upon evaluation today patient appears to be making progress. The wound is not quite as deep but he still does not have a great wound surface yet for working on this and the Santyl does seem to be cleaning things up. Fortunately there is no evidence of active infection at this time. No fevers, chills, nausea, vomiting, or diarrhea. 04/17/2019 upon evaluation today patient appears to be doing excellent at this time in regard to his foot ulcer. I do  believe the Santyl with the alginate packed in behind has been of benefit for him and I am very pleased in this regard. With that being said I am feeling like the wound surface is improving quite a bit each time I see him still I do not believe we are at the point of stating that the wound bed is perfect but again were making progress. The patient is going require some sharp debridement today. 4/22; this is a patient who is using Santyl with calcium alginate. Apparently his wound dimensions have been improving. Patient is changing the dressing himself. He has a surgical shoe. He works in a sitting position therefore is not on his feet all that much. There is undermining 05/01/2019 upon evaluation today patient appears to be doing a little better in regard to the size of his wound though he still has some depth. This does seem to be much cleaner has been using Santyl on the base of the wound followed by packing with silver alginate and behind. Nonetheless I do believe that based on what we are seeing we may need to switch to a collagen type dressing endoform may actually be excellent for him. 05/08/2019 upon evaluation today patient's wound actually appears to be showing better granulation tissue in the base of the wound. With that being said he does have some epiboly noted around the edges of the wound especially plantar and more distal. Subsequently this is can require sharp  debridement today. Fortunately there is no signs of active systemic infection though I do feel like there is some local cellulitis noted at this point. The patient also notes has had increased drainage. 5/13; wound volume quite a bit improved this week down to 3 mm. Patient states pain and drainage better. Culture from last week grew staph aureus [not MRSA] and this is sensitive to quinolones and he is on Levaquin. HOWEVER he also grew abundant Enterococcus faecalis treatment of choice for this is ampicillin. Quinolone coverage is  unreliable 5/20 completing the Augmentin. Small wound on the right lateral foot. Thick rolled edges around the wound. Using silver alginate 05/29/2019 upon evaluation today patient appears to be doing better compared to last time I saw him. Fortunately there is no signs of active infection and I do feel like he is actually making good progress. Overall the quality of the wound bed along with the size looks improved and the inflammation/erythema that was previously noted by myself when I saw the couple weeks back actually has also resolved and this is great. Overall very pleased with how things stand. 06/06/2019 upon evaluation today patient actually appears to be doing quite well with regard to his foot ulcer with regard to some of the granulation were seen at this point. There does not appear to be any evidence of systemic or local infection although he still has discomfort I feel like things are moving in a slow but surely correct direction. I think we may need to help fill some of the space however even though he is using the collagen we may need to have something behind this such as a plain packing strip to try to help hold the collagen in the base of the wound. 06/12/2019 upon evaluation today patient appears to be doing well with regard to his foot ulcer. This seems to be making some slow but steady progress. The wound does not appear to be as deep today compared to where it was previous. Fortunately there is no signs of active infection at [name], [name]. ([id number]) this time. No fevers, chills, nausea, vomiting, or diarrhea. Objective Constitutional Well-nourished and well-hydrated in no acute distress. Vitals Time Taken: 8:35 AM, Height: 74 in, Weight: 308 lbs, BMI: 39.5, Temperature: 98.2 F, Pulse: 79 bpm, Respiratory Rate: 18 breaths/min, Blood Pressure: 161/65 mmHg. Respiratory normal breathing without difficulty. Psychiatric this patient is able to make decisions and  demonstrates good insight into disease process. Alert and Oriented x 3. pleasant and cooperative. General Notes: Upon inspection patient's wound bed did not require sharp debridement today but I was able to clean out mechanically with saline and gauze and a Q-tip the slough noted in the surface of the wound internally. The wound does appear to be doing better I feel like we are making some progress here. Integumentary (Hair, Skin) Wound #4 status is Open. Original cause of wound was Gradually Appeared. The wound is located on the Right,Lateral Foot. The wound measures 0.8cm length x 0.7cm width x 0.3cm depth; 0.44cm^2 area and 0.132cm^3 volume. There is Fat Layer (Subcutaneous Tissue) Exposed exposed. There is a medium amount of purulent drainage noted. The wound margin is flat and intact. There is medium (34-66%) pink granulation within the wound bed. There is a medium (34-66%) amount of necrotic tissue within the wound bed including Adherent Slough. Assessment Active Problems ICD-10 Type 2 diabetes mellitus with foot ulcer 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d Cellulitis of right lower  limb Corns and callosities Essential (primary) hypertension Acquired absence of other left toe(s) Plan Wound Cleansing: Wound #4 Right,Lateral Foot: Clean wound with Normal Saline. - in office Dial antibacterial soap, wash wounds, rinse and pat dry prior to dressing wounds Primary Wound Dressing: Wound #4 Right,Lateral Foot: Silver Collagen - moisten with normal saline Plain packing gauze - behind collagen Secondary Dressing: Wound #4 Right,Lateral Foot: Gauze and Kerlix/Conform Dressing Change Frequency: Wound #4 Right,Lateral Foot: Change dressing every other day. Edema Control: Wound #4 Right,Lateral Foot: RONEN, BROMWELL ([id number]) Patient to wear own compression stockings - wear daily Off-Loading: Wound #4 Right,Lateral Foot: Open toe surgical  shoe with peg assist. 1. I would recommend currently that we go ahead and continue with the wound care measures as before. The patient is in agreement with the plan this means that we will be placing collagen into the base of the wound and then pack a small packing strip in behind act as a wick. 2. I am also going to suggest that we continue to monitor for any signs of infection. Obviously right now it does not appear to be infected but that obviously could change it we will be very closely monitoring this. 3. The patient does need to continue to wear the offloading shoe in order to help with preventing pressure to the area. We will see patient back for reevaluation in 1 week here in the clinic. If anything worsens or changes patient will contact our office for additional recommendations. Electronic Signature(s) Signed: 06/12/2019 9:31:25 AM By: [name] PA-C Entered By: [name] on 06/12/2019 09:31:25 [name] ([id number]) -------------------------------------------------------------------------------- SuperBill Details Patient Name: [name] Date of Service: 06/12/2019 Medical Record Number: [id number] Patient Account Number: [id number] Date of Birth/Sex: [birth year] (54 y.o. M) Treating RN: [name] Primary Care Provider: [name] Other Clinician: Referring Provider: [name] Treating Provider/Extender: Melburn Hake, Jamesia Linnen Weeks in Treatment: 13 Diagnosis Coding ICD-10 Codes Code Description E11.621 Type 2 diabetes mellitus with foot ulcer L97.512 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d L03.115 Cellulitis of right lower limb L84 Corns and callosities I10 Essential (primary) hypertension Z89.422 Acquired absence of other left toe(s) Facility Procedures CPT4 Code: 99357017 Description: 99213 - WOUND CARE VISIT-LEV 3 EST PT Modifier: Quantity: 1 Physician Procedures CPT4 Code:  [id number] Description: 99213 - WC PHYS LEVEL 3 - EST PT Modifier: Quantity: 1 CPT4 Code: Description: ICD-10 Diagnosis Description E11.621 Type 2 diabetes mellitus with foot ulcer L97.512 Non-pressure chronic ulcer of other part of right foot with fat layer e L03.115 Cellulitis of right lower limb L84 Corns and callosities Modifier: xposed Quantity: Electronic Signature(s) Signed: 06/12/2019 9:31:42 AM By: [name] PA-C Entered By: [name] on 06/12/2019 09:31:41

## 2019-06-19 ENCOUNTER — Encounter: Payer: Managed Care, Other (non HMO) | Admitting: Physician Assistant

## 2019-06-19 ENCOUNTER — Other Ambulatory Visit: Payer: Self-pay

## 2019-06-19 DIAGNOSIS — E11621 Type 2 diabetes mellitus with foot ulcer: Secondary | ICD-10-CM | POA: Diagnosis not present

## 2019-06-19 NOTE — Progress Notes (Addendum)
[name], [name] ([id number]) Visit Report for 06/19/2019 Chief Complaint Document Details Patient Name: [name]. Date of Service: 06/19/2019 10:00 AM Medical Record Number: [id number] Patient Account Number: [id number] Date of Birth/Sex: [birth year] (54 y.o. M) Treating RN: [name] Primary Care Provider: [name] Other Clinician: Referring Provider: [name] Treating Provider/Extender: [name], [name]: 14 Information Obtained from: Patient Chief Complaint Right lateral foot ulcer Electronic Signature(s) Signed: 06/19/2019 10:05:10 AM By: [name] [name] Entered By: [name] on 06/19/2019 10:05:10 [name] ([id number]) -------------------------------------------------------------------------------- Debridement Details Patient Name: [name] Date of Service: 06/19/2019 10:00 AM Medical Record Number: [id number] Patient Account Number: [id number] Date of Birth/Sex: Feb 19, 1965 (54 y.o. M) Treating RN: [name] Primary Care Provider: [name] Other Clinician: Referring Provider: [name] Treating Provider/Extender: [name], Javonna Balli Weeks in [name]: 14 Debridement Performed for Wound #4 Right,Lateral Foot Assessment: Performed By: Physician [name] Debridement Type: Debridement Severity of Tissue Pre Debridement: Fat layer exposed Level of Consciousness (Pre- Awake and Alert procedure): Pre-procedure Verification/Time Out Yes - 10:48 Taken: Start Time: 10:49 Pain Control: Lidocaine Total Area Debrided (L x W): 0.5 (cm) x 0.5 (cm) = 0.25 (cm) Tissue and other material Viable, Non-Viable, Callus, Subcutaneous debrided: Level: Skin/Subcutaneous Tissue Debridement Description: Excisional Instrument: Curette Bleeding: Minimum Hemostasis Achieved: Pressure End Time: 10:50 Response to [name]: Procedure was tolerated well Level of Consciousness  (Post- Awake and Alert procedure): Post Debridement Measurements of Total Wound Length: (cm) 0.5 Width: (cm) 0.5 Depth: (cm) 1.1 Volume: (cm) 0.216 Character of Wound/Ulcer Post Debridement: Stable Severity of Tissue Post Debridement: Fat layer exposed Post Procedure Diagnosis Same as Pre-procedure Electronic Signature(s) Signed: 06/19/2019 1:58:43 PM By: [name] Signed: 06/19/2019 4:21:54 PM By: [name] [name] Entered By: [name] on 06/19/2019 10:50:12 [name] ([id number]) -------------------------------------------------------------------------------- HPI Details Patient Name: [name] Date of Service: 06/19/2019 10:00 AM Medical Record Number: [id number] Patient Account Number: [id number] Date of Birth/Sex: 04-10-1965 (54 y.o. M) Treating RN: [name] Primary Care Provider: [name] Other Clinician: Referring Provider: [name] Treating Provider/Extender: [name] Weeks in [name]: 14 History of Present Illness HPI Description: This 54 year old male comes with an ulcerated area on the plantar aspect of the right foot which she's had for approximately a month. I have known him from a previous visit at [facility] wound center and was treated in the months of April and May 2016 and rapidly healed a left plantar ulcer with a total contact cast. He has been a diabetic for about 16 years and tries to keep active and is fairly compliant with his diabetes management. 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 Past medical history significant for hypertension, hyperlipidemia, and status post appendectomy 1993. He does not smoke or drink alcohol. 01/14/2015 -- the patient had tolerated his total contact cast very well and had no problems and has had no systemic symptoms. However when his total contact cast was cut open he had excessive amount of purulent drainage in spite of being on antibiotics. He had had a recent  x-ray done in the ER 12/22/2014 which showed IMPRESSION:No evidence of osseous erosion. Known soft tissue ulceration is not well characterized on radiograph. Scattered vascular calcifications seen. his last hemoglobin A1c in December was 7.3. He has been on Augmentin and doxycycline for the last 2 weeks. 01/21/2015 -- his culture grew rare growth of Pantoea species an MR moderate growth of Candida parapsilosis. it is sensitive  to levofloxacin. He has not heard back from the insurance company regarding his hyperbaric oxygen therapy. His MRI has not been done yet and we will try and get him an earlier date 01/28/2015 -- MRI was done last night -- IMPRESSION:1. Soft tissue ulcer overlying the plantar aspect of the fifth metatarsal head extending to the cortex. Subcortical marrow edema in the fifth metatarsal head with corresponding T1 hypointensity is concerning for early osteomyelitis of the plantar lateral aspect of the fifth metatarsal head. Chest x-ray done on 01/14/2015 shows bronchiectatic changes without infiltrate. EKG done on generally 17 2017 shows a normal sinus rhythm and is a normal EKG. 02/04/2015 -- he was asked to see Dr. Ola [name] last week and had 2 appointments but had to cancel both due to pressures of work. Last night he has woken up with severe pain in the foot and leg and it is swollen up. No fever or no change in his blood glucose. Addendum: I spoke with Dr. Ola [name] who kindly agreed to accept the patient for inpatient therapy and have also opened to the hospitalist Dr. Domingo [name], and discuss details of the management including PICC line and repeat cultures. 02/12/2015-- -- was seen by Dr. Ola [name] in the hospital and a PICC line was placed. He was to receive Ceftazidime 2 g every 12 hourly, oral levofloxacin 750 mg every 24 hourly and oral fluconazole 200 mg daily. The antibiotics were to be given for 4 weeks except the Diflucan was to be given for the first 2 weeks. Reviewed  note from 02/10/2015 -- and Dr. Ola [name] had recommended management for growth of MSSA and Serratia. He switched him from ceftazidime to ceftriaxone 2 g every 24 hours. Levofloxacin was stopped and he would continue on fluconazole for another week. He had asked me to decide whether further imaging was necessary and whether surgical debridement of the infected bone was needed. He is doing well and has been off work for this week and we will keep him off the next week. 02/22/2015 -- he was seen by my colleague on 01/19/2015 and at that time an incision and drainage was done on his right lateral forefoot on the dorsum. Today when I probed this wound it is frankly draining pus and it communicates with the ulcer on the plantar aspect of his right foot. The patient is still on IV ceftriaxone 2 g every 24 hours and is to be seen by Dr. Ola [name] on Friday. 03/19/2015 -- On 03/04/2015 I spoke to Dr. 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 He recommended operative debridement which would probably result in the fifth metatarsal head and toe amputation.The patient would be referred back to [place] once he was done with surgery. He was admitted to [facility] yesterday and had surgery done by podiatry for a right fifth metatarsal acute osteomyelitis with cellulitis and abscess. He had a right foot incision and drainage with fifth metatarsal partial amputation and removal of toe infected bone and soft tissue with cultures. The wound was partially closed and packing of the distal end was done. Patient was already on cefepime 2 g IV every 8 hourly and put on vancomycin pending final cultures. He had grown moderate gram-negative rods, and later found to be rare diphtheroids. I received a call from Dr. Cannon Kettle the podiatrist and we  discussed the  above. On 03/10/2015 he was found positive for influenza a and has been put on Tamiflu. Since his discharge he has been seen by Dr. Cannon Kettle who is planning to remove his sutures this coming week. He was reviewed by Dr. Ola [name] on 03/17/2015 and his Diflucan was stopped and vancomycin. After the 3 doses he is taking. He is going to change Ceftazidime to Zosyn 3.375 g IV every 8 hours. 03/25/2015 -- he was seen by the podiatrist a couple of days ago and the sutures were removed. She will follow back with him in 4 weeks' time and at that time x-rays will be taken and a custom molded insert would be made for his shoe. 04/01/2015 -- he was seen by Dr. Ola [name] on 03/29/2015 who pulled the PICC line stopped his IV antibiotics and recommended starting doxycycline and levofloxacin for 2 weeks. He also stop the fluconazole. The patient will follow up with him only when necessary. Readmission: 03/15/17 on evaluation today patient presents for initial evaluation concerning the new issues although he has previously been evaluated in our clinic. Unfortunately the previous evaluation led to the patient having to proceed to amputation and so he is somewhat nervous about being here [name], [name] ([id number]) today. With that being said he has a very slight blister that occurred on the plantar aspect more medial on the right great toe that has been present for just a very short amount of time, several days. With that being said he felt like initially when he called that he somewhat overreacted but due to the fact that his previous issue led to amputation he is very cautious these days I explained that he did the right thing. With that being said he has been tolerating the dressing changes without complication mainly he just been covering this. He does not have any discomfort in secondary to neuropathy it's unlikely that he feels much. With that being said he does tell me that the issue that he had here is  that he went barefoot when he knows he should not have which subsequently led to the blisters. He states is definitely not doing that anymore. His most recent hemoglobin A1c was December and registered 6.9 his ABI today was 1.1. 03/27/17 on evaluation today patient appears to be doing great in regard to his right great toe ulcer. He has been tolerating the dressing changes without complication. The good news is this is making excellent progress and he seems to be caring for this in an excellent fashion. I see no evidence of breakdown that would make me concerned that he was at risk for infection/amputation. This has obviously been his concern due to the fifth toe amputation that was necessitated previous when he had a similar issue. Nonetheless this seems to be progressing much more nicely. Readmission: 03/13/2019 upon evaluation today patient presents for reevaluation here in the clinic concerning issues he has been having with his right foot on the lateral portion at the proximal end of the metatarsal. Subsequently he tells me that this just opened up in the past couple of days or so and the erythema really began about 24 to 48 hours ago. Fortunately there is no signs of systemic infection. 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 That left him with this bony prominence that is where the wound is currently. There is erythema surrounding that does have me concerned about cellulitis. With that being said he was noncompressible  as far as ABIs are concerned I do think we can need to check into arterial studies at this point. Patient does have a history of hypertension along with diabetes mellitus type 2. He also tends to develop quite a bit of callus white often. 03/20/2019 upon evaluation today patient appears to be doing very well in regard to his wounds currently. He has been tolerating the dressing changes without complication. Fortunately there is no  signs of infection. His ABIs were good at this point and registered at 1.07 on the left and 1.09 on the right. In regard to the x-ray this was negative as well for any signs of osteomyelitis. The patient also seems to be doing better in regard to the infection. He still has several days of the antibiotic left at this point best the Bactrim DS and he seems to be doing very well with this. Overall I am extremely pleased with the progress in 1 week's time he is going require some sharp debridement today however. 03/27/2019 upon evaluation today patient actually appears to be making some progress in regard to his wound. It is a little deeper but measuring smaller which is good news due to the fact that again were clear away some of the necrotic tissue which is why the depth is increasing a little bit. Nonetheless after like were getting very close to being down at a good wound bed. Fortunately there is no signs of active infection at this time. There is a little bit of erythema immediately surrounding the wound that we do want to be very cognizant of and careful about. For that reason I am going to go ahead and extend his antibiotic today for an additional 10 days that is the Bactrim. 04/03/2019 upon evaluation today patient appears to be making some progress. I do feel like the Annitta Needs is doing a good job along with the alginate is being packed into the wound space behind. He has been tolerating the dressing changes without complication. Fortunately there is no signs of active infection at this time. No fevers, chills, nausea, vomiting, or diarrhea. 04/10/2019 upon evaluation today patient appears to be making progress. The wound is not quite as deep but he still does not have a great wound surface yet for working on this and the Santyl does seem to be cleaning things up. Fortunately there is no evidence of active infection at this time. No fevers, chills, nausea, vomiting, or diarrhea. 04/17/2019 upon  evaluation today patient appears to be doing excellent at this time in regard to his foot ulcer. I do believe the Santyl with the alginate packed in behind has been of benefit for him and I am very pleased in this regard. With that being said I am feeling like the wound surface is improving quite a bit each time I see him still I do not believe we are at the point of stating that the wound bed is perfect but again were making progress. The patient is going require some sharp debridement today. 4/22; this is a patient who is using Santyl with calcium alginate. Apparently his wound dimensions have been improving. Patient is changing the dressing himself. He has a surgical shoe. He works in a sitting position therefore is not on his feet all that much. There is undermining 05/01/2019 upon evaluation today patient appears to be doing a little better in regard to the size of his wound though he still has some depth. This does seem to be much cleaner has been  using Santyl on the base of the wound followed by packing with silver alginate and behind. Nonetheless I do believe that based on what we are seeing we may need to switch to a collagen type dressing endoform may actually be excellent for him. 05/08/2019 upon evaluation today patient's wound actually appears to be showing better granulation tissue in the base of the wound. With that being said he does have some epiboly noted around the edges of the wound especially plantar and more distal. Subsequently this is can require sharp debridement today. Fortunately there is no signs of active systemic infection though I do feel like there is some local cellulitis noted at this point. The patient also notes has had increased drainage. 5/13; wound volume quite a bit improved this week down to 3 mm. Patient states pain and drainage better. Culture from last week grew staph aureus [not MRSA] and this is sensitive to quinolones and he is on Levaquin. HOWEVER he also  grew abundant Enterococcus faecalis [name] of choice for this is ampicillin. Quinolone coverage is unreliable 5/20 completing the Augmentin. Small wound on the right lateral foot. Thick rolled edges around the wound. Using silver alginate 05/29/2019 upon evaluation today patient appears to be doing better compared to last time I saw him. Fortunately there is no signs of active infection and I do feel like he is actually making good progress. Overall the quality of the wound bed along with the size looks improved and the inflammation/erythema that was previously noted by myself when I saw the couple weeks back actually has also resolved and this is great. Overall very pleased with how things stand. 06/06/2019 upon evaluation today patient actually appears to be doing quite well with regard to his foot ulcer with regard to some of the granulation were seen at this point. There does not appear to be any evidence of systemic or local infection although he still has discomfort I feel like things are moving in a slow but surely correct direction. I think we may need to help fill some of the space however even though he is using the collagen we may need to have something behind this such as a plain packing strip to try to help hold the collagen in the base of the wound. 06/12/2019 upon evaluation today patient appears to be doing well with regard to his foot ulcer. This seems to be making some slow but steady progress. The wound does not appear to be as deep today compared to where it was previous. Fortunately there is no signs of active infection at this time. No fevers, chills, nausea, vomiting, or diarrhea. 06/19/2019 on evaluation today patient's wound does not appear to be doing quite as well as what I would like to see. Fortunately there is no signs [name], [name]. ([id number]) of systemic infection at this time which is great news. With that being said I do believe that the patient needs to have a  wound culture as I do believe he likely has a local infection that needs to be addressed. He is also can require some debridement today as well. Electronic Signature(s) Signed: 06/19/2019 3:46:50 PM By: [name] [name] Entered By: [name] on 06/19/2019 15:46:50 [name] ([id number]) -------------------------------------------------------------------------------- Physical Exam Details Patient Name: [name], [name] Date of Service: 06/19/2019 10:00 AM Medical Record Number: [id number] Patient Account Number: [id number] Date of Birth/Sex: 1965/01/23 (54 y.o. M) Treating RN: [name] Primary Care Provider: [name] Other Clinician: Referring Provider: Dagmar Hait,  RAVISANKAR Treating Provider/Extender: [name] III, Tupac Jeffus Weeks in [name]: 73 Constitutional Well-nourished and well-hydrated in no acute distress. Respiratory normal breathing without difficulty. Notes Upon inspection patient's wound bed actually showed unfortunately a blister area more proximal they did have to be cleared away. Subsequently there was increased drainage as well as some increased pain the patient is very frustrated I can tell. With that being said I do believe he needs to have the antibiotic as soon as possible to try to keep this under control and in the past he did do well with the Augmentin initially had given Levaquin which did not do as well and then we switched it. Nonetheless I believe that the Augmentin would be a good option at this point for him since he is done well with that in the past. Electronic Signature(s) Signed: 06/19/2019 3:47:36 PM By: [name] [name] Entered By: [name] on 06/19/2019 15:47:35 [name] ([id number]) -------------------------------------------------------------------------------- Physician Orders Details Patient Name: [name] Date of Service: 06/19/2019 10:00 AM Medical Record Number: [id number] Patient  Account Number: [id number] Date of Birth/Sex: 22-Feb-1965 (54 y.o. 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 - in office o Dial antibacterial soap, wash wounds, rinse and pat dry prior to dressing wounds Primary Wound Dressing Wound #4 Right,Lateral Foot o Silver Alginate Secondary Dressing Wound #4 Right,Lateral Foot o Gauze and Kerlix/Conform Dressing Change Frequency Wound #4 Right,Lateral Foot o Change dressing every other day. Edema Control Wound #4 Right,Lateral Foot o Patient to wear own compression stockings - wear daily Off-Loading Wound #4 Right,Lateral Foot o Open toe surgical shoe with peg assist. Laboratory o Bacteria identified in Wound by Culture (MICRO) - Right foot oooo LOINC Code: 3235-5 DDUK Convenience Name: Wound culture routine Patient Medications Allergies: No Known Allergies Notifications Medication Indication Start End Augmentin 06/19/2019 DOSE 1 - oral 875 mg-125 mg tablet - 1 tablet oral taken 2 times per day for 15 days [name], [name] ([id number]) Electronic Signature(s) Signed: 06/19/2019 3:48:47 PM By: [name] [name] Previous Signature: 06/19/2019 1:58:43 PM Version By: [name] Entered By: [name] on 06/19/2019 15:48:46 [name]  ([id number]) -------------------------------------------------------------------------------- Problem List Details Patient Name: [name] Date of Service: 06/19/2019 10:00 AM Medical Record Number: [id number] Patient Account Number: [id number] Date of Birth/Sex: 09-13-1965 (54 y.o. 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 Date of Service: 06/19/2019 10:00 AM Medical Record Number: [id number] Patient Account Number: [id number] Date of Birth/Sex: 02-27-1965 (54 y.o. M) Treating RN: [name] Primary Care Provider: [name] Other Clinician: Referring Provider: [name] Treating Provider/Extender: [name], Trea Carnegie Weeks in [name]: 14 Subjective Chief Complaint Information obtained from Patient Right lateral foot ulcer History of Present Illness (HPI) This 54 year old male comes with an ulcerated area on the plantar aspect of the right foot which she's had for approximately a month. I have known him from a previous  visit at [facility] wound center and was treated in the months of April and May 2016 and rapidly healed a left plantar ulcer with a total contact cast. He has been a diabetic for about 16 years and tries to keep active and is fairly compliant with his diabetes management. 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 Past medical history significant for hypertension, hyperlipidemia, and status post appendectomy 1993. He does not smoke or drink alcohol. 01/14/2015 -- the patient had tolerated his total contact cast very well and had no problems and has had no systemic symptoms. However when his total contact cast was cut open he had excessive amount of purulent drainage in spite of being on antibiotics. He had had a recent x-ray done in the ER 12/22/2014 which showed IMPRESSION:No evidence of osseous erosion. Known soft tissue ulceration is not well characterized on radiograph. Scattered vascular calcifications seen. his last hemoglobin A1c in December was 7.3. He has been on Augmentin and doxycycline for the last 2 weeks. 01/21/2015 -- his culture grew rare growth of Pantoea species an MR moderate growth of Candida parapsilosis. it is sensitive to levofloxacin. He has not heard back from the insurance company regarding his hyperbaric oxygen therapy. His MRI has not been done yet and we will try and get him an earlier date 01/28/2015 -- MRI was done last night -- IMPRESSION:1. Soft tissue ulcer overlying the plantar aspect of the fifth metatarsal head extending to the cortex. Subcortical marrow edema in the fifth metatarsal head with corresponding T1 hypointensity is concerning for early osteomyelitis of the plantar lateral aspect of the fifth metatarsal head. Chest x-ray done on 01/14/2015 shows bronchiectatic changes without infiltrate. EKG done on generally 17 2017 shows a normal sinus rhythm and is a normal EKG. 02/04/2015 -- he was asked to see Dr. Ola [name] last week and had 2 appointments but  had to cancel both due to pressures of work. Last night he has woken up with severe pain in the foot and leg and it is swollen up. No fever or no change in his blood glucose. Addendum: I spoke with Dr. Ola [name] who kindly agreed to accept the patient for inpatient therapy and have also opened to the hospitalist Dr. Domingo [name], and discuss details of the management including PICC line and repeat cultures. 02/12/2015-- -- was seen by Dr. Ola [name] in the hospital and a PICC line was placed. He was to receive Ceftazidime 2 g every 12 hourly, oral levofloxacin 750 mg every 24 hourly and oral fluconazole 200 mg daily. The antibiotics were to be given for 4 weeks except the Diflucan was to be given for the first 2 weeks. Reviewed note from 02/10/2015 -- and Dr. Ola [name] had recommended management for growth of MSSA and Serratia. He switched him from ceftazidime to ceftriaxone 2 g every 24 hours. Levofloxacin was stopped and he would continue on fluconazole for  another week. He had asked me to decide whether further imaging was necessary and whether surgical debridement of the infected bone was needed. He is doing well and has been off work for this week and we will keep him off the next week. 02/22/2015 -- he was seen by my colleague on 01/19/2015 and at that time an incision and drainage was done on his right lateral forefoot on the dorsum. Today when I probed this wound it is frankly draining pus and it communicates with the ulcer on the plantar aspect of his right foot. The patient is still on IV ceftriaxone 2 g every 24 hours and is to be seen by Dr. Ola [name] on Friday. 03/19/2015 -- On 03/04/2015 I spoke to Dr. 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 He recommended operative debridement which would probably result in the fifth metatarsal head  and toe amputation.The patient would be referred back to [place] once he was done with surgery. He was admitted to [facility] yesterday and had surgery done by podiatry for a right fifth metatarsal acute osteomyelitis with cellulitis and abscess. He had a right foot incision and drainage with fifth metatarsal partial amputation and removal of toe infected bone and soft tissue with cultures. The wound was partially closed and packing of the distal end was done. Patient was already on cefepime 2 g IV every 8 hourly and put on vancomycin pending final cultures. He had grown moderate gram-negative rods, and later found to be rare diphtheroids. I received a call from Dr. Cannon Kettle the podiatrist and we discussed the above. On 03/10/2015 he was found positive for influenza a and has been put on Tamiflu. Since his discharge he has been seen by Dr. Cannon Kettle who is planning to remove his sutures this coming week. He was reviewed by Dr. Ola [name] on 03/17/2015 and his Diflucan was stopped and vancomycin. After the 3 doses he is taking. He is going to change Ceftazidime to Zosyn 3.375 g IV every 8 hours. 03/25/2015 -- he was seen by the podiatrist a couple of days ago and the sutures were removed. She will follow back with him in 4 weeks' time and at that time x-rays will be taken and a custom molded insert would be made for his shoe. 04/01/2015 -- he was seen by Dr. Ola [name] on 03/29/2015 who pulled the PICC line stopped his IV antibiotics and recommended starting doxycycline and levofloxacin for 2 weeks. He also stop the fluconazole. The patient will follow up with him only when necessary. AMAAR, [name] ([id number]) Readmission: 03/15/17 on evaluation today patient presents for initial evaluation concerning the new issues although he has previously been evaluated in our clinic. Unfortunately the previous evaluation led to the patient having to proceed to amputation and so he is somewhat nervous  about being here today. With that being said he has a very slight blister that occurred on the plantar aspect more medial on the right great toe that has been present for just a very short amount of time, several days. With that being said he felt like initially when he called that he somewhat overreacted but due to the fact that his previous issue led to amputation he is very cautious these days I explained that he did the right thing. With that being said he has been  tolerating the dressing changes without complication mainly he just been covering this. He does not have any discomfort in secondary to neuropathy it's unlikely that he feels much. With that being said he does tell me that the issue that he had here is that he went barefoot when he knows he should not have which subsequently led to the blisters. He states is definitely not doing that anymore. His most recent hemoglobin A1c was December and registered 6.9 his ABI today was 1.1. 03/27/17 on evaluation today patient appears to be doing great in regard to his right great toe ulcer. He has been tolerating the dressing changes without complication. The good news is this is making excellent progress and he seems to be caring for this in an excellent fashion. I see no evidence of breakdown that would make me concerned that he was at risk for infection/amputation. This has obviously been his concern due to the fifth toe amputation that was necessitated previous when he had a similar issue. Nonetheless this seems to be progressing much more nicely. Readmission: 03/13/2019 upon evaluation today patient presents for reevaluation here in the clinic concerning issues he has been having with his right foot on the lateral portion at the proximal end of the metatarsal. Subsequently he tells me that this just opened up in the past couple of days or so and the erythema really began about 24 to 48 hours ago. Fortunately there is no signs of systemic  infection. 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 That left him with this bony prominence that is where the wound is currently. There is erythema surrounding that does have me concerned about cellulitis. With that being said he was noncompressible as far as ABIs are concerned I do think we can need to check into arterial studies at this point. Patient does have a history of hypertension along with diabetes mellitus type 2. He also tends to develop quite a bit of callus white often. 03/20/2019 upon evaluation today patient appears to be doing very well in regard to his wounds currently. He has been tolerating the dressing changes without complication. Fortunately there is no signs of infection. His ABIs were good at this point and registered at 1.07 on the left and 1.09 on the right. In regard to the x-ray this was negative as well for any signs of osteomyelitis. The patient also seems to be doing better in regard to the infection. He still has several days of the antibiotic left at this point best the Bactrim DS and he seems to be doing very well with this. Overall I am extremely pleased with the progress in 1 week's time he is going require some sharp debridement today however. 03/27/2019 upon evaluation today patient actually appears to be making some progress in regard to his wound. It is a little deeper but measuring smaller which is good news due to the fact that again were clear away some of the necrotic tissue which is why the depth is increasing a little bit. Nonetheless after like were getting very close to being down at a good wound bed. Fortunately there is no signs of active infection at this time. There is a little bit of erythema immediately surrounding the wound that we do want to be very cognizant of and careful about. For that reason I am going to go ahead and extend his antibiotic today for an additional 10 days that is the  Bactrim. 04/03/2019 upon  evaluation today patient appears to be making some progress. I do feel like the Annitta Needs is doing a good job along with the alginate is being packed into the wound space behind. He has been tolerating the dressing changes without complication. Fortunately there is no signs of active infection at this time. No fevers, chills, nausea, vomiting, or diarrhea. 04/10/2019 upon evaluation today patient appears to be making progress. The wound is not quite as deep but he still does not have a great wound surface yet for working on this and the Santyl does seem to be cleaning things up. Fortunately there is no evidence of active infection at this time. No fevers, chills, nausea, vomiting, or diarrhea. 04/17/2019 upon evaluation today patient appears to be doing excellent at this time in regard to his foot ulcer. I do believe the Santyl with the alginate packed in behind has been of benefit for him and I am very pleased in this regard. With that being said I am feeling like the wound surface is improving quite a bit each time I see him still I do not believe we are at the point of stating that the wound bed is perfect but again were making progress. The patient is going require some sharp debridement today. 4/22; this is a patient who is using Santyl with calcium alginate. Apparently his wound dimensions have been improving. Patient is changing the dressing himself. He has a surgical shoe. He works in a sitting position therefore is not on his feet all that much. There is undermining 05/01/2019 upon evaluation today patient appears to be doing a little better in regard to the size of his wound though he still has some depth. This does seem to be much cleaner has been using Santyl on the base of the wound followed by packing with silver alginate and behind. Nonetheless I do believe that based on what we are seeing we may need to switch to a collagen type dressing endoform may actually be  excellent for him. 05/08/2019 upon evaluation today patient's wound actually appears to be showing better granulation tissue in the base of the wound. With that being said he does have some epiboly noted around the edges of the wound especially plantar and more distal. Subsequently this is can require sharp debridement today. Fortunately there is no signs of active systemic infection though I do feel like there is some local cellulitis noted at this point. The patient also notes has had increased drainage. 5/13; wound volume quite a bit improved this week down to 3 mm. Patient states pain and drainage better. Culture from last week grew staph aureus [not MRSA] and this is sensitive to quinolones and he is on Levaquin. HOWEVER he also grew abundant Enterococcus faecalis [name] of choice for this is ampicillin. Quinolone coverage is unreliable 5/20 completing the Augmentin. Small wound on the right lateral foot. Thick rolled edges around the wound. Using silver alginate 05/29/2019 upon evaluation today patient appears to be doing better compared to last time I saw him. Fortunately there is no signs of active infection and I do feel like he is actually making good progress. Overall the quality of the wound bed along with the size looks improved and the inflammation/erythema that was previously noted by myself when I saw the couple weeks back actually has also resolved and this is great. Overall very pleased with how things stand. 06/06/2019 upon evaluation today patient actually appears to be doing quite well with regard to his foot ulcer with  regard to some of the granulation were seen at this point. There does not appear to be any evidence of systemic or local infection although he still has discomfort I feel like things are moving in a slow but surely correct direction. I think we may need to help fill some of the space however even though he is using the collagen we may need to have something behind  this such as a plain packing strip to try to help hold the collagen in the base of the wound. 06/12/2019 upon evaluation today patient appears to be doing well with regard to his foot ulcer. This seems to be making some slow but steady progress. The wound does not appear to be as deep today compared to where it was previous. Fortunately there is no signs of active infection at [name], [name]. ([id number]) this time. No fevers, chills, nausea, vomiting, or diarrhea. 06/19/2019 on evaluation today patient's wound does not appear to be doing quite as well as what I would like to see. Fortunately there is no signs of systemic infection at this time which is great news. With that being said I do believe that the patient needs to have a wound culture as I do believe he likely has a local infection that needs to be addressed. He is also can require some debridement today as well. Objective Constitutional Well-nourished and well-hydrated in no acute distress. Vitals Time Taken: 9:55 AM, Height: 74 in, Weight: 308 lbs, BMI: 39.5, Temperature: 98.3 F, Pulse: 79 bpm, Respiratory Rate: 16 breaths/min, Blood Pressure: 152/68 mmHg. Respiratory normal breathing without difficulty. General Notes: Upon inspection patient's wound bed actually showed unfortunately a blister area more proximal they did have to be cleared away. Subsequently there was increased drainage as well as some increased pain the patient is very frustrated I can tell. With that being said I do believe he needs to have the antibiotic as soon as possible to try to keep this under control and in the past he did do well with the Augmentin initially had given Levaquin which did not do as well and then we switched it. Nonetheless I believe that the Augmentin would be a good option at this point for him since he is done well with that in the past. Integumentary (Hair, Skin) Wound #4 status is Open. Original cause of wound was Gradually  Appeared. The wound is located on the Right,Lateral Foot. The wound measures 0.5cm length x 0.5cm width x 1.1cm depth; 0.196cm^2 area and 0.216cm^3 volume. There is Fat Layer (Subcutaneous Tissue) Exposed exposed. There is undermining starting at 12:00 and ending at 6:00 with a maximum distance of 1.2cm. There is a medium amount of purulent drainage noted. The wound margin is flat and intact. There is medium (34-66%) pink granulation within the wound bed. There is a medium (34- 66%) amount of necrotic tissue within the wound bed including Adherent Slough. Assessment Active Problems ICD-10 Type 2 diabetes mellitus with foot ulcer 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d Cellulitis of right lower limb Corns and callosities Essential (primary) hypertension Acquired absence of other left toe(s) Procedures Wound #4 Pre-procedure diagnosis of Wound #4 is a Diabetic Wound/Ulcer of the Lower Extremity located on the Right,Lateral Foot .Severity of Tissue Pre Debridement is: Fat layer exposed. There was a Excisional Skin/Subcutaneous Tissue Debridement with a total area of 0.25 sq cm performed by [name] III, Halford Goetzke E., [name]. With the following instrument(s): Curette to remove Viable and Non-Viable  tissue/material. Material removed includes Callus and Subcutaneous Tissue and after achieving pain control using Lidocaine. A time out was conducted at 10:48, prior to the start of the procedure. A Minimum amount of bleeding was controlled with Pressure. The procedure was tolerated well. Post Debridement Measurements: 0.5cm length x 0.5cm width x 1.1cm depth; 0.216cm^3 volume. Character of Wound/Ulcer Post Debridement is stable. Severity of Tissue Post Debridement is: Fat layer exposed. Post procedure Diagnosis Wound #4: Same as Pre-Procedure [name], [name] ([id number]) Plan Wound Cleansing: Wound #4 Right,Lateral Foot: Clean wound with Normal Saline. - in  office Dial antibacterial soap, wash wounds, rinse and pat dry prior to dressing wounds Primary Wound Dressing: Wound #4 Right,Lateral Foot: Silver Alginate Secondary Dressing: Wound #4 Right,Lateral Foot: Gauze and Kerlix/Conform Dressing Change Frequency: Wound #4 Right,Lateral Foot: Change dressing every other day. Edema Control: Wound #4 Right,Lateral Foot: Patient to wear own compression stockings - wear daily Off-Loading: Wound #4 Right,Lateral Foot: Open toe surgical shoe with peg assist. Laboratory ordered were: Wound culture routine - Right foot The following medication(s) was prescribed: Augmentin oral 875 mg-125 mg tablet 1 1 tablet oral taken 2 times per day for 15 days starting 06/19/2019 1. I would recommend currently that we go ahead and initiate [name] with a continuation of the silver alginate dressings that we have used in the past I do believe we will go ahead and reinitiate that currently I think that is better than the collagen based on what I am seeing today. 2. I am also can obtain a wound culture which was sent for evaluation today. Depending on the results of the culture we may need to either extend the Augmentin or else continue with this for a bit longer. 3. I am also can recommend the patient continue to monitor for any signs of active infection worsening and if he has any issues he knows he should contact the office and let me know. 4. We will continue to use the offloading shoe and hopefully this will keep his foot from getting too much pressure to the area in general I feel like the wound looks better other than the fact that he has the new blistered area we had to clear away the original site is improved. We will see patient back for reevaluation in 1 week here in the clinic. If anything worsens or changes patient will contact our office for additional recommendations. Electronic Signature(s) Signed: 06/19/2019 3:49:58 PM By: [name]  [name] Entered By: [name] on 06/19/2019 15:49:57 [name] ([id number]) -------------------------------------------------------------------------------- SuperBill Details Patient Name: [name] Date of Service: 06/19/2019 Medical Record Number: [id number] Patient Account Number: [id number] Date of Birth/Sex: Mar 07, 1965 (54 y.o. M) Treating RN: [name] Primary Care Provider: [name] Other Clinician: Referring Provider: [name] Treating Provider/Extender: [name], Porsche Noguchi Weeks in [name]: 14 Diagnosis Coding ICD-10 Codes Code Description E11.621 Type 2 diabetes mellitus with foot ulcer L97.512 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d L03.115 Cellulitis of right lower limb L84 Corns and callosities I10 Essential (primary) hypertension Z89.422 Acquired absence of other left toe(s) Facility Procedures CPT4 Code: 44315400 Description: 11042 - DEB SUBQ TISSUE 20 SQ CM/< Modifier: Quantity: 1 CPT4 Code: Description: ICD-10 Diagnosis Description L97.512 Non-pressure chronic ulcer of other part of right foot with fat layer ex Modifier: posed Quantity: Physician Procedures CPT4 Code: 8676195 Description: 99214 - WC PHYS LEVEL 4 - EST PT Modifier: 25 Quantity: 1 CPT4 Code: Description: ICD-10 Diagnosis  Description E11.621 Type 2 diabetes mellitus with foot ulcer L97.512 Non-pressure chronic ulcer of other part of right foot with fat layer ex L03.115 Cellulitis of right lower limb L84 Corns and callosities Modifier: posed Quantity: CPT4 Code: 5686168 Description: 37290 - WC PHYS SUBQ TISS 20 SQ CM Modifier: Quantity: 1 CPT4 Code: Description: ICD-10 Diagnosis Description L97.512 Non-pressure chronic ulcer of other part of right foot with fat layer ex Modifier: posed Quantity: Electronic Signature(s) Signed: 06/19/2019 3:50:20 PM By: [name] [name] Entered By: [name] on  06/19/2019 15:50:19

## 2019-06-20 ENCOUNTER — Other Ambulatory Visit
Admission: RE | Admit: 2019-06-20 | Discharge: 2019-06-20 | Disposition: A | Discharge status: Home / Self Care | Payer: Managed Care, Other (non HMO) | Source: Ambulatory Visit | Attending: Physician Assistant | Admitting: Physician Assistant

## 2019-06-20 DIAGNOSIS — L03115 Cellulitis of right lower limb: Secondary | ICD-10-CM | POA: Diagnosis not present

## 2019-06-20 NOTE — Progress Notes (Signed)
WELLES, WALTHALL ([id number]) Visit Report for 06/19/2019 Arrival Information Details Patient Name: [name], [name]. Date of Service: 06/19/2019 10:00 AM Medical Record Number: [id number] Patient Account Number: [id number] Date of Birth/Sex: 1965-05-01 (54 y.o. M) Treating RN: [name] Primary Care Joylyn Duggin: [name] Other Clinician: Referring Marti Mclane: [name] Treating Dauna Ziska/Extender: Melburn Hake, HOYT Weeks in Treatment: 14 Visit Information History Since Last Visit Added or deleted any medications: No Patient Arrived: Ambulatory Any new allergies or adverse reactions: No Arrival Time: 09:58 Had a fall or experienced change in No Accompanied By: self activities of daily living that may affect Transfer Assistance: None risk of falls: Patient Identification Verified: Yes Signs or symptoms of abuse/neglect since last visito No Secondary Verification Process Completed: Yes Hospitalized since last visit: No Patient Has Alerts: Yes Implantable device outside of the clinic excluding No Patient Alerts: DMII cellular tissue based products placed in the center since last visit: Has Dressing in Place as Prescribed: Yes Pain Present Now: No Electronic Signature(s) Signed: 06/19/2019 3:30:19 PM By: Lorine Bears RCP, RRT, CHT Entered By: Lorine Bears on 06/19/2019 09:59:14 [name] ([id number]) -------------------------------------------------------------------------------- Encounter Discharge Information Details Patient Name: [name] Date of Service: 06/19/2019 10:00 AM Medical Record Number: [id number] Patient Account Number: [id number] Date of Birth/Sex: 1965-07-21 (54 y.o. M) Treating RN: [name] Primary Care Zuleika Gallus: [name] Other Clinician: Referring Jaevon Paras: [name] Treating Kitt Ledet/Extender: Melburn Hake, HOYT Weeks in Treatment: 14 Encounter Discharge Information Items  Post Procedure Vitals Discharge Condition: Stable Temperature (F): 98.3 Ambulatory Status: Ambulatory Pulse (bpm): 79 Discharge Destination: Home Respiratory Rate (breaths/min): 16 Transportation: Private Auto Blood Pressure (mmHg): 152/68 Accompanied By: self Schedule Follow-up Appointment: Yes Clinical Summary of Care: Electronic Signature(s) Signed: 06/19/2019 1:58:43 PM By: [name] Entered By: [name] on 06/19/2019 10:57:52 [name] ([id number]) -------------------------------------------------------------------------------- Lower Extremity Assessment Details Patient Name: [name] Date of Service: 06/19/2019 10:00 AM Medical Record Number: [id number] Patient Account Number: [id number] Date of Birth/Sex: 1965-02-24 (54 y.o. M) Treating RN: Cornell Barman Primary Care Elna Radovich: [name] Other Clinician: Referring Midge Momon: [name] Treating Rhapsody Wolven/Extender: Sharalyn Ink in Treatment: 14 Vascular Assessment Pulses: Dorsalis Pedis Palpable: [Left:Yes] [Right:Yes] Electronic Signature(s) Signed: 06/20/2019 11:02:18 AM By: Gretta Cool, BSN, RN, CWS, Kim RN, BSN Entered By: Gretta Cool, BSN, RN, CWS, Kim on 06/19/2019 10:11:02 [name] ([id number]) -------------------------------------------------------------------------------- Multi Wound Chart Details Patient Name: [name]. Date of Service: 06/19/2019 10:00 AM Medical Record Number: [id number] Patient Account Number: [id number] Date of Birth/Sex: 19-Jul-1965 (54 y.o. M) Treating RN: [name] Primary Care Cassy Sprowl: [name] Other Clinician: Referring Oprah Camarena: [name] Treating Jakari [name]/Extender: Melburn Hake, HOYT Weeks in Treatment: 14 Vital Signs Height(in): 74 Pulse(bpm): 79 Weight(lbs): 308 Blood Pressure(mmHg): 152/68 Body Mass Index(BMI): 40 Temperature(F): 98.3 Respiratory Rate(breaths/min): 16 Photos: [N/A:N/A] Wound  Location: Right, Lateral Foot N/A N/A Wounding Event: Gradually Appeared N/A N/A Primary Etiology: Diabetic Wound/Ulcer of the Lower N/A N/A Extremity Comorbid History: Hypertension, Type II Diabetes, N/A N/A Neuropathy Date Acquired: 03/12/2019 N/A N/A Weeks of Treatment: 14 N/A N/A Wound Status: Open N/A N/A Pending Amputation on Yes N/A N/A Presentation: Measurements L x W x D (cm) 0.5x0.5x1.1 N/A N/A Area (cm) : 0.196 N/A N/A Volume (cm) : 0.216 N/A N/A % Reduction in Area: 69.20% N/A N/A % Reduction in Volume: -70.10% N/A N/A Starting Position 1 (o'clock): 12 Ending Position 1 (o'clock): 6 Maximum Distance 1 (cm): 1.2 Undermining: Yes N/A N/A Classification: Grade 1 N/A N/A Exudate  Amount: Medium N/A N/A Exudate Type: Purulent N/A N/A Exudate Color: yellow, brown, green N/A N/A Wound Margin: Flat and Intact N/A N/A Granulation Amount: Medium (34-66%) N/A N/A Granulation Quality: Pink N/A N/A Necrotic Amount: Medium (34-66%) N/A N/A Exposed Structures: Fat Layer (Subcutaneous Tissue) N/A N/A Exposed: Yes Fascia: No Tendon: No Muscle: No Joint: No Bone: No Epithelialization: None N/A N/A Treatment Notes Electronic Signature(s) JESE, COMELLA ([id number]) Signed: 06/19/2019 1:58:43 PM By: [name] Entered By: [name] on 06/19/2019 10:49:16 [name] ([id number]) -------------------------------------------------------------------------------- [place] Details Patient Name: [name] Date of Service: 06/19/2019 10:00 AM Medical Record Number: [id number] Patient Account Number: [id number] Date of Birth/Sex: 1965-12-20 (55 y.o. M) Treating RN: [name] Primary Care Niccolo Burggraf: [name] Other Clinician: Referring Lilyannah Zuelke: [name] Treating Veronica Fretz/Extender: Melburn Hake, HOYT Weeks in Treatment: 14 Active Inactive Nutrition Nursing Diagnoses: Impaired glucose control: actual or potential  Goals: Patient/caregiver verbalizes understanding of need to maintain therapeutic glucose control per primary care physician Date Initiated: 03/13/2019 Target Resolution Date: 04/11/2019 Goal Status: Active Interventions: Assess patient nutrition upon admission and as needed per policy Notes: Orientation to the Wound Care Program Nursing Diagnoses: Knowledge deficit related to the wound healing center program Goals: Patient/caregiver will verbalize understanding of the [place] Date Initiated: 03/13/2019 Target Resolution Date: 04/11/2019 Goal Status: Active Interventions: Provide education on orientation to the wound center Notes: Wound/Skin Impairment Nursing Diagnoses: Impaired tissue integrity Goals: Ulcer/skin breakdown will have a volume reduction of 30% by week 4 Date Initiated: 03/13/2019 Target Resolution Date: 04/11/2019 Goal Status: Active Interventions: Assess ulceration(s) every visit Notes: Electronic Signature(s) Signed: 06/19/2019 1:58:43 PM By: [name] Entered By: [name] on 06/19/2019 10:49:07 [name] ([id number]) -------------------------------------------------------------------------------- Pain Assessment Details Patient Name: [name] Date of Service: 06/19/2019 10:00 AM Medical Record Number: [id number] Patient Account Number: [id number] Date of Birth/Sex: 09-19-1965 (54 y.o. M) Treating RN: Cornell Barman Primary Care Orval Dortch: [name] Other Clinician: Referring Michail Boyte: [name] Treating Rhyan Radler/Extender: Melburn Hake, HOYT Weeks in Treatment: 14 Active Problems Location of Pain Severity and Description of Pain Patient Has Paino No Site Locations Pain Management and Medication Current Pain Management: Electronic Signature(s) Signed: 06/20/2019 11:02:18 AM By: Gretta Cool, BSN, RN, CWS, Kim RN, BSN Entered By: Gretta Cool, BSN, RN, CWS, Kim on 06/19/2019 10:07:28 [name] ([id number])  -------------------------------------------------------------------------------- Patient/Caregiver Education Details Patient Name: [name] Date of Service: 06/19/2019 10:00 AM Medical Record Number: [id number] Patient Account Number: [id number] Date of Birth/Gender: 16-Oct-1965 (54 y.o. M) Treating RN: [name] Primary Care Physician: [name] Other Clinician: Referring Physician: [name] Treating Physician/Extender: Sharalyn Ink in Treatment: 14 Education Assessment Education Provided To: Patient Education Topics Provided Wound/Skin Impairment: Handouts: Caring for Your Ulcer Methods: Demonstration, Explain/Verbal Responses: State content correctly Electronic Signature(s) Signed: 06/19/2019 1:58:43 PM By: [name] Entered By: [name] on 06/19/2019 10:57:01 [name] ([id number]) -------------------------------------------------------------------------------- Wound Assessment Details Patient Name: [name] Date of Service: 06/19/2019 10:00 AM Medical Record Number: [id number] Patient Account Number: [id number] Date of Birth/Sex: 1965/10/05 (54 y.o. M) Treating RN: Cornell Barman Primary Care Felder Lebeda: [name] Other Clinician: Referring Labresha Mellor: [name] Treating Roderic Lammert/Extender: Melburn Hake, HOYT Weeks in Treatment: 14 Wound Status Wound Number: 4 Primary Etiology: Diabetic Wound/Ulcer of the Lower Extremity Wound Location: Right, Lateral Foot Wound Status: Open Wounding Event: Gradually Appeared Comorbid History: Hypertension, Type II Diabetes, Neuropathy Date Acquired: 03/12/2019 Weeks Of Treatment: 14 Clustered Wound: No Pending Amputation On  Presentation Photos Wound Measurements Length: (cm) 0.5 % Redu Width: (cm) 0.5 % Redu Depth: (cm) 1.1 Epithe Area: (cm) 0.196 Under Volume: (cm) 0.216 St End Max ction in Area: 69.2% ction in Volume: -70.1% lialization: None mining:  Yes arting Position (o'clock): 12 ing Position (o'clock): 6 imum Distance: (cm) 1.2 Wound Description Classification: Grade 1 Foul O Wound Margin: Flat and Intact Slough Exudate Amount: Medium Exudate Type: Purulent Exudate Color: yellow, brown, green dor After Cleansing: No /Fibrino Yes Wound Bed Granulation Amount: Medium (34-66%) Exposed Structure Granulation Quality: Pink Fascia Exposed: No Necrotic Amount: Medium (34-66%) Fat Layer (Subcutaneous Tissue) Exposed: Yes Necrotic Quality: Adherent Slough Tendon Exposed: No Muscle Exposed: No Joint Exposed: No Bone Exposed: No Treatment Notes Wound #4 (Right, Lateral Foot) KYDAN, SHANHOLTZER ([id number]) Notes scell, gauze, conform Electronic Signature(s) Signed: 06/20/2019 11:02:18 AM By: Gretta Cool, BSN, RN, CWS, Kim RN, BSN Entered By: Gretta Cool, BSN, RN, CWS, Kim on 06/19/2019 10:09:18 [name] ([id number]) -------------------------------------------------------------------------------- Vitals Details Patient Name: [name] Date of Service: 06/19/2019 10:00 AM Medical Record Number: [id number] Patient Account Number: [id number] Date of Birth/Sex: [birth year] (54 y.o. M) Treating RN: [name] Primary Care Antero Derosia: [name] Other Clinician: Referring Gilbert Narain: [name] Treating Estuardo Frisbee/Extender: Melburn Hake, HOYT Weeks in Treatment: 14 Vital Signs Time Taken: 09:55 Temperature (F): 98.3 Height (in): 74 Pulse (bpm): 79 Weight (lbs): 308 Respiratory Rate (breaths/min): 16 Body Mass Index (BMI): 39.5 Blood Pressure (mmHg): 152/68 Reference Range: 80 - 120 mg / dl Electronic Signature(s) Signed: 06/19/2019 3:30:19 PM By: Lorine Bears RCP, RRT, CHT Entered By: Lorine Bears on 06/19/2019 10:00:18

## 2019-06-22 LAB — AEROBIC CULTURE W GRAM STAIN (SUPERFICIAL SPECIMEN): Culture: NORMAL

## 2019-06-23 ENCOUNTER — Encounter (INDEPENDENT_AMBULATORY_CARE_PROVIDER_SITE_OTHER): Payer: Managed Care, Other (non HMO) | Admitting: Ophthalmology

## 2019-06-26 ENCOUNTER — Other Ambulatory Visit: Payer: Self-pay

## 2019-06-26 ENCOUNTER — Encounter: Payer: Managed Care, Other (non HMO) | Admitting: Physician Assistant

## 2019-06-26 DIAGNOSIS — E11621 Type 2 diabetes mellitus with foot ulcer: Secondary | ICD-10-CM | POA: Diagnosis not present

## 2019-06-26 NOTE — Progress Notes (Signed)
GID, SCHOFFSTALL ([id number]) Visit Report for 06/26/2019 Arrival Information Details Patient Name: [name], [name]. Date of Service: 06/26/2019 9:45 AM Medical Record Number: [id number] Patient Account Number: [id number] Date of Birth/Sex: 03-10-1965 (54 y.o. M) Treating RN: [name] Primary Care Keigan Tafoya: [name] Other Clinician: Referring Rosselyn Martha: [name] Treating Levern Pitter/Extender: Melburn Hake, HOYT Weeks in Treatment: 15 Visit Information History Since Last Visit Added or deleted any medications: No Patient Arrived: Ambulatory Any new allergies or adverse reactions: No Arrival Time: 09:48 Had a fall or experienced change in No Accompanied By: self activities of daily living that may affect Transfer Assistance: None risk of falls: Patient Identification Verified: Yes Signs or symptoms of abuse/neglect since last visito No Patient Has Alerts: Yes Hospitalized since last visit: No Patient Alerts: DMII Has Dressing in Place as Prescribed: Yes Pain Present Now: No Electronic Signature(s) Signed: 06/26/2019 10:24:12 AM By: [name] Entered By: [name] on 06/26/2019 09:48:15 [name] ([id number]) -------------------------------------------------------------------------------- Clinic Level of Care Assessment Details Patient Name: [name] Date of Service: 06/26/2019 9:45 AM Medical Record Number: [id number] Patient Account Number: [id number] Date of Birth/Sex: [birth year] (54 y.o. M) Treating RN: [name] Primary Care Marijean Montanye: [name] Other Clinician: Referring Roylene Heaton: [name] Treating Aunesty Tyson/Extender: Melburn Hake, HOYT Weeks in Treatment: 15 Clinic Level of Care Assessment Items TOOL 4 Quantity Score []  - Use when only an EandM is performed on FOLLOW-UP visit 0 ASSESSMENTS - Nursing Assessment / Reassessment X - Reassessment of Co-morbidities (includes updates in patient status) 1 10 X- 1  5 Reassessment of Adherence to Treatment Plan ASSESSMENTS - Wound and Skin Assessment / Reassessment X - Simple Wound Assessment / Reassessment - one wound 1 5 []  - 0 Complex Wound Assessment / Reassessment - multiple wounds []  - 0 Dermatologic / Skin Assessment (not related to wound area) ASSESSMENTS - Focused Assessment []  - Circumferential Edema Measurements - multi extremities 0 []  - 0 Nutritional Assessment / Counseling / Intervention X- 1 5 Lower Extremity Assessment (monofilament, tuning fork, pulses) []  - 0 Peripheral Arterial Disease Assessment (using hand held doppler) ASSESSMENTS - Ostomy and/or Continence Assessment and Care []  - Incontinence Assessment and Management 0 []  - 0 Ostomy Care Assessment and Management (repouching, etc.) PROCESS - Coordination of Care X - Simple Patient / Family Education for ongoing care 1 15 []  - 0 Complex (extensive) Patient / Family Education for ongoing care []  - 0 Staff obtains Programmer, systems, Records, Test Results / Process Orders []  - 0 Staff telephones HHA, Nursing Homes / Clarify orders / etc []  - 0 Routine Transfer to another Facility (non-emergent condition) []  - 0 Routine Hospital Admission (non-emergent condition) []  - 0 New Admissions / Biomedical engineer / Ordering NPWT, Apligraf, etc. []  - 0 Emergency Hospital Admission (emergent condition) X- 1 10 Simple Discharge Coordination []  - 0 Complex (extensive) Discharge Coordination PROCESS - Special Needs []  - Pediatric / Minor Patient Management 0 []  - 0 Isolation Patient Management []  - 0 Hearing / Language / Visual special needs []  - 0 Assessment of Community assistance (transportation, D/C planning, etc.) []  - 0 Additional assistance / Altered mentation []  - 0 Support Surface(s) Assessment (bed, cushion, seat, etc.) INTERVENTIONS - Wound Cleansing / Measurement KATELYN, KOHLMEYER. ([id number]) X- 1 5 Simple Wound Cleansing - one wound []  - 0 Complex  Wound Cleansing - multiple wounds X- 1 5 Wound Imaging (photographs - any number of wounds) []  - 0 Wound Tracing (instead of photographs) X- 1 5 Simple  Wound Measurement - one wound []  - 0 Complex Wound Measurement - multiple wounds INTERVENTIONS - Wound Dressings []  - Small Wound Dressing one or multiple wounds 0 X- 1 15 Medium Wound Dressing one or multiple wounds []  - 0 Large Wound Dressing one or multiple wounds []  - 0 Application of Medications - topical []  - 0 Application of Medications - injection INTERVENTIONS - Miscellaneous []  - External ear exam 0 []  - 0 Specimen Collection (cultures, biopsies, blood, body fluids, etc.) []  - 0 Specimen(s) / Culture(s) sent or taken to Lab for analysis []  - 0 Patient Transfer (multiple staff / Civil Service fast streamer / Similar devices) []  - 0 Simple Staple / Suture removal (25 or less) []  - 0 Complex Staple / Suture removal (26 or more) []  - 0 Hypo / Hyperglycemic Management (close monitor of Blood Glucose) []  - 0 Ankle / Brachial Index (ABI) - do not check if billed separately X- 1 5 Vital Signs Has the patient been seen at the hospital within the last three years: Yes Total Score: 85 Level Of Care: New/Established - Level 3 Electronic Signature(s) Signed: 06/26/2019 10:24:12 AM By: [name] Entered By: [name] on 06/26/2019 09:59:41 [name] ([id number]) -------------------------------------------------------------------------------- Encounter Discharge Information Details Patient Name: [name] Date of Service: 06/26/2019 9:45 AM Medical Record Number: [id number] Patient Account Number: [id number] Date of Birth/Sex: Oct 01, 1965 (54 y.o. M) Treating RN: [name] Primary Care Jujuan Dugo: [name] Other Clinician: Referring Jahmeir Geisen: [name] Treating Mikalia Fessel/Extender: Melburn Hake, HOYT Weeks in Treatment: 15 Encounter Discharge Information Items Discharge Condition:  Stable Ambulatory Status: Ambulatory Discharge Destination: Home Transportation: Private Auto Accompanied By: self Schedule Follow-up Appointment: Yes Clinical Summary of Care: Electronic Signature(s) Signed: 06/26/2019 10:24:12 AM By: [name] Entered By: [name] on 06/26/2019 10:00:32 [name] ([id number]) -------------------------------------------------------------------------------- Lower Extremity Assessment Details Patient Name: [name] Date of Service: 06/26/2019 9:45 AM Medical Record Number: [id number] Patient Account Number: [id number] Date of Birth/Sex: 05-20-1965 (54 y.o. M) Treating RN: [name] Primary Care Jalisa Sacco: [name] Other Clinician: Referring Dreyson Mishkin: [name] Treating Estefany Goebel/Extender: STONE III, HOYT Weeks in Treatment: 15 Edema Assessment Assessed: [Left: No] [Right: No] Edema: [Left: N] [Right: o] Vascular Assessment Pulses: Dorsalis Pedis Palpable: [Right:Yes] Electronic Signature(s) Signed: 06/26/2019 10:24:12 AM By: [name] Entered By: [name] on 06/26/2019 09:52:57 [name] ([id number]) -------------------------------------------------------------------------------- Multi Wound Chart Details Patient Name: [name] Date of Service: 06/26/2019 9:45 AM Medical Record Number: [id number] Patient Account Number: [id number] Date of Birth/Sex: Nov 19, 1965 (54 y.o. M) Treating RN: [name] Primary Care Inanna Telford: [name] Other Clinician: Referring Sameena Artus: [name] Treating Fadil Macmaster/Extender: Melburn Hake, HOYT Weeks in Treatment: 15 Vital Signs Height(in): 74 Pulse(bpm): 75 Weight(lbs): 308 Blood Pressure(mmHg): 144/70 Body Mass Index(BMI): 40 Temperature(F): 98.4 Respiratory Rate(breaths/min): 16 Photos: [N/A:N/A] Wound Location: Right, Lateral Foot N/A N/A Wounding Event: Gradually Appeared N/A N/A Primary Etiology: Diabetic  Wound/Ulcer of the Lower N/A N/A Extremity Comorbid History: Hypertension, Type II Diabetes, N/A N/A Neuropathy Date Acquired: 03/12/2019 N/A N/A Weeks of Treatment: 15 N/A N/A Wound Status: Open N/A N/A Pending Amputation on Yes N/A N/A Presentation: Measurements L x W x D (cm) 0.4x0.2x0.7 N/A N/A Area (cm) : 0.063 N/A N/A Volume (cm) : 0.044 N/A N/A % Reduction in Area: 90.10% N/A N/A % Reduction in Volume: 65.40% N/A N/A Classification: Grade 1 N/A N/A Exudate Amount: Medium N/A N/A Exudate Type: Purulent N/A N/A Exudate Color: yellow, brown, green N/A N/A Wound Margin: Flat and  Intact N/A N/A Granulation Amount: Medium (34-66%) N/A N/A Granulation Quality: Pink N/A N/A Necrotic Amount: Medium (34-66%) N/A N/A Exposed Structures: Fat Layer (Subcutaneous Tissue) N/A N/A Exposed: Yes Fascia: No Tendon: No Muscle: No Joint: No Bone: No Epithelialization: None N/A N/A Treatment Notes Electronic Signature(s) Signed: 06/26/2019 10:24:12 AM By: [name] Entered By: [name] on 06/26/2019 09:58:31 [name] ([name] ([id number]) -------------------------------------------------------------------------------- Caroline Details Patient Name: CAROLE, DEERE. Date of Service: 06/26/2019 9:45 AM Medical Record Number: [id number] Patient Account Number: [id number] Date of Birth/Sex: [birth year] (54 y.o. M) Treating RN: [name] Primary Care Braden Cimo: [name] Other Clinician: Referring Ciela Mahajan: [name] Treating Hoover Grewe/Extender: Melburn Hake, HOYT Weeks in Treatment: 15 Active Inactive Nutrition Nursing Diagnoses: Impaired glucose control: actual or potential Goals: Patient/caregiver verbalizes understanding of need to maintain therapeutic glucose control per primary care physician Date Initiated: 03/13/2019 Target Resolution Date: 04/11/2019 Goal Status: Active Interventions: Assess  patient nutrition upon admission and as needed per policy Notes: Orientation to the Wound Care Program Nursing Diagnoses: Knowledge deficit related to the wound healing center program Goals: Patient/caregiver will verbalize understanding of the [place] Date Initiated: 03/13/2019 Target Resolution Date: 04/11/2019 Goal Status: Active Interventions: Provide education on orientation to the wound center Notes: Wound/Skin Impairment Nursing Diagnoses: Impaired tissue integrity Goals: Ulcer/skin breakdown will have a volume reduction of 30% by week 4 Date Initiated: 03/13/2019 Target Resolution Date: 04/11/2019 Goal Status: Active Interventions: Assess ulceration(s) every visit Notes: Electronic Signature(s) Signed: 06/26/2019 10:24:12 AM By: [name] Entered By: [name] on 06/26/2019 09:58:21 [name] ([id number]) -------------------------------------------------------------------------------- Pain Assessment Details Patient Name: [name] Date of Service: 06/26/2019 9:45 AM Medical Record Number: [id number] Patient Account Number: [id number] Date of Birth/Sex: [birth year] (54 y.o. M) Treating RN: [name] Primary Care Alysiah Suppa: [name] Other Clinician: Referring Tristen Luce: [name] Treating Oveda Dadamo/Extender: Melburn Hake, HOYT Weeks in Treatment: 15 Active Problems Location of Pain Severity and Description of Pain Patient Has Paino No Site Locations Pain Management and Medication Current Pain Management: Electronic Signature(s) Signed: 06/26/2019 10:24:12 AM By: [name] Entered By: [name] on 06/26/2019 09:51:37 [name] ([id number]) -------------------------------------------------------------------------------- Patient/Caregiver Education Details Patient Name: [name] Date of Service: 06/26/2019 9:45 AM Medical Record Number: [id number] Patient Account Number:  [id number] Date of Birth/Gender: September 25, 1965 (54 y.o. M) Treating RN: [name] Primary Care Physician: [name] Other Clinician: Referring Physician: [name] Treating Physician/Extender: Sharalyn Ink in Treatment: 15 Education Assessment Education Provided To: Patient Education Topics Provided Wound/Skin Impairment: Handouts: Caring for Your Ulcer Methods: Demonstration, Explain/Verbal Responses: State content correctly Electronic Signature(s) Signed: 06/26/2019 10:24:12 AM By: [name] Entered By: [name] on 06/26/2019 10:00:04 [name] ([id number]) -------------------------------------------------------------------------------- Wound Assessment Details Patient Name: [name] Date of Service: 06/26/2019 9:45 AM Medical Record Number: [id number] Patient Account Number: [id number] Date of Birth/Sex: March 12, 1965 (54 y.o. M) Treating RN: [name] Primary Care Armand Preast: [name] Other Clinician: Referring Raysha Tilmon: [name] Treating Marytza Grandpre/Extender: STONE III, HOYT Weeks in Treatment: 15 Wound Status Wound Number: 4 Primary Etiology: Diabetic Wound/Ulcer of the Lower Extremity Wound Location: Right, Lateral Foot Wound Status: Open Wounding Event: Gradually Appeared Comorbid History: Hypertension, Type II Diabetes, Neuropathy Date Acquired: 03/12/2019 Weeks Of Treatment: 15 Clustered Wound: No Pending Amputation On Presentation Photos Wound Measurements Length: (cm) 0.4 % Redu Width: (cm) 0.2 % Redu Depth: (cm) 0.7 Epithe Area: (cm) 0.063 Volume: (cm) 0.044 ction in Area: 90.1%  ction in Volume: 65.4% lialization: None Wound Description Classification: Grade 1 Foul O Wound Margin: Flat and Intact Slough Exudate Amount: Medium Exudate Type: Purulent Exudate Color: yellow, brown, green dor After Cleansing: No /Fibrino Yes Wound Bed Granulation Amount: Medium (34-66%) Exposed  Structure Granulation Quality: Pink Fascia Exposed: No Necrotic Amount: Medium (34-66%) Fat Layer (Subcutaneous Tissue) Exposed: Yes Necrotic Quality: Adherent Slough Tendon Exposed: No Muscle Exposed: No Joint Exposed: No Bone Exposed: No Treatment Notes Wound #4 (Right, Lateral Foot) Notes scell, gauze, conform QUINDARIUS, CABELLO ([id number]) Electronic Signature(s) Signed: 06/26/2019 10:24:12 AM By: [name] Entered By: [name] on 06/26/2019 09:52:00 [name] ([id number]) -------------------------------------------------------------------------------- Vitals Details Patient Name: [name] Date of Service: 06/26/2019 9:45 AM Medical Record Number: [id number] Patient Account Number: [id number] Date of Birth/Sex: 03-03-1965 (54 y.o. M) Treating RN: [name] Primary Care Shenea Giacobbe: [name] Other Clinician: Referring Denese Mentink: [name] Treating Mayli Covington/Extender: Melburn Hake, HOYT Weeks in Treatment: 15 Vital Signs Time Taken: 09:48 Temperature (F): 98.4 Height (in): 74 Pulse (bpm): 75 Weight (lbs): 308 Respiratory Rate (breaths/min): 16 Body Mass Index (BMI): 39.5 Blood Pressure (mmHg): 144/70 Reference Range: 80 - 120 mg / dl Electronic Signature(s) Signed: 06/26/2019 10:24:12 AM By: [name] Entered By: [name] on 06/26/2019 09:51:30

## 2019-06-26 NOTE — Progress Notes (Addendum)
[name], [name] ([id number]) Visit Report for 06/26/2019 Chief Complaint Document Details Patient Name: [name], [name]. Date of Service: 06/26/2019 9:45 AM Medical Record Number: [id number] Patient Account Number: [id number] Date of Birth/Sex: 17-Sep-1965 (54 y.o. M) Treating RN: [name] Primary Care Provider: [name] Other Clinician: Referring Provider: [name] Treating Provider/Extender: Melburn Hake, Squire Withey Weeks in Treatment: 15 Information Obtained from: Patient Chief Complaint Right lateral foot ulcer Electronic Signature(s) Signed: 06/26/2019 9:55:48 AM By: [name] PA-C Entered By: [name] on 06/26/2019 09:55:47 [name] ([id number]) -------------------------------------------------------------------------------- HPI Details Patient Name: [name] Date of Service: 06/26/2019 9:45 AM Medical Record Number: [id number] Patient Account Number: [id number] Date of Birth/Sex: 1965/05/30 (54 y.o. M) Treating RN: [name] Primary Care Provider: [name] Other Clinician: Referring Provider: [name] Treating Provider/Extender: STONE III, Macguire Holsinger Weeks in Treatment: 15 History of Present Illness HPI Description: This 54 year old male comes with an ulcerated area on the plantar aspect of the right foot which she's had for approximately a month. I have known him from a previous visit at [facility] wound center and was treated in the months of April and May 2016 and rapidly healed a left plantar ulcer with a total contact cast. He has been a diabetic for about 16 years and tries to keep active and is fairly compliant with his diabetes management. 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 Past medical history significant for hypertension, hyperlipidemia, and status post appendectomy 1993. He does not smoke or drink alcohol. 01/14/2015 -- the patient had tolerated his total contact cast very well and had no  problems and has had no systemic symptoms. However when his total contact cast was cut open he had excessive amount of purulent drainage in spite of being on antibiotics. He had had a recent x-ray done in the ER 12/22/2014 which showed IMPRESSION:No evidence of osseous erosion. Known soft tissue ulceration is not well characterized on radiograph. Scattered vascular calcifications seen. his last hemoglobin A1c in December was 7.3. He has been on Augmentin and doxycycline for the last 2 weeks. 01/21/2015 -- his culture grew rare growth of Pantoea species an MR moderate growth of Candida parapsilosis. it is sensitive to levofloxacin. He has not heard back from the insurance company regarding his hyperbaric oxygen therapy. His MRI has not been done yet and we will try and get him an earlier date 01/28/2015 -- MRI was done last night -- IMPRESSION:1. Soft tissue ulcer overlying the plantar aspect of the fifth metatarsal head extending to the cortex. Subcortical marrow edema in the fifth metatarsal head with corresponding T1 hypointensity is concerning for early osteomyelitis of the plantar lateral aspect of the fifth metatarsal head. Chest x-ray done on 01/14/2015 shows bronchiectatic changes without infiltrate. EKG done on generally 17 2017 shows a normal sinus rhythm and is a normal EKG. 02/04/2015 -- he was asked to see Dr. [name] last week and had 2 appointments but had to cancel both due to pressures of work. Last night he has woken up with severe pain in the foot and leg and it is swollen up. No fever or no change in his blood glucose. Addendum: I spoke with Dr. [name] who kindly agreed to accept the patient for inpatient therapy and have also opened to the hospitalist Dr. [name], and discuss details of the management including PICC line and repeat cultures. 02/12/2015-- -- was seen by Dr. [name] in the hospital and a PICC line was placed. He was to receive Ceftazidime 2  g every 12  hourly, oral levofloxacin 750 mg every 24 hourly and oral fluconazole 200 mg daily. The antibiotics were to be given for 4 weeks except the Diflucan was to be given for the first 2 weeks. Reviewed note from 02/10/2015 -- and Dr. [name] had recommended management for growth of MSSA and Serratia. He switched him from ceftazidime to ceftriaxone 2 g every 24 hours. Levofloxacin was stopped and he would continue on fluconazole for another week. He had asked me to decide whether further imaging was necessary and whether surgical debridement of the infected bone was needed. He is doing well and has been off work for this week and we will keep him off the next week. 02/22/2015 -- he was seen by my colleague on 01/19/2015 and at that time an incision and drainage was done on his right lateral forefoot on the dorsum. Today when I probed this wound it is frankly draining pus and it communicates with the ulcer on the plantar aspect of his right foot. The patient is still on IV ceftriaxone 2 g every 24 hours and is to be seen by Dr. [name] on Friday. 03/19/2015 -- On 03/04/2015 I spoke to Dr. 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 He recommended operative debridement which would probably result in the fifth metatarsal head and toe amputation.The patient would be referred back to [place] once he was done with surgery. He was admitted to [facility] yesterday and had surgery done by podiatry for a right fifth metatarsal acute osteomyelitis with cellulitis and abscess. He had a right foot incision and drainage with fifth metatarsal partial amputation and removal of toe infected bone and soft tissue with cultures. The wound was partially closed and packing of the distal end was done. Patient was already on cefepime 2 g IV every 8 hourly and put  on vancomycin pending final cultures. He had grown moderate gram-negative rods, and later found to be rare diphtheroids. I received a call from Dr. Cannon Kettle the podiatrist and we discussed the above. On 03/10/2015 he was found positive for influenza a and has been put on Tamiflu. Since his discharge he has been seen by Dr. Cannon Kettle who is planning to remove his sutures this coming week. He was reviewed by Dr. [name] on 03/17/2015 and his Diflucan was stopped and vancomycin. After the 3 doses he is taking. He is going to change Ceftazidime to Zosyn 3.375 g IV every 8 hours. 03/25/2015 -- he was seen by the podiatrist a couple of days ago and the sutures were removed. She will follow back with him in 4 weeks' time and at that time x-rays will be taken and a custom molded insert would be made for his shoe. 04/01/2015 -- he was seen by Dr. [name] on 03/29/2015 who pulled the PICC line stopped his IV antibiotics and recommended starting doxycycline and levofloxacin for 2 weeks. He also stop the fluconazole. The patient will follow up with him only when necessary. Readmission: 03/15/17 on evaluation today patient presents for initial evaluation concerning the new issues although he has previously been evaluated in our clinic. Unfortunately the previous evaluation led to the patient having to proceed to amputation and so he is somewhat nervous about being here VINEETH, FELL ([id number]) today. With that being said he has a very  slight blister that occurred on the plantar aspect more medial on the right great toe that has been present for just a very short amount of time, several days. With that being said he felt like initially when he called that he somewhat overreacted but due to the fact that his previous issue led to amputation he is very cautious these days I explained that he did the right thing. With that being said he has been tolerating the dressing changes without complication mainly  he just been covering this. He does not have any discomfort in secondary to neuropathy it's unlikely that he feels much. With that being said he does tell me that the issue that he had here is that he went barefoot when he knows he should not have which subsequently led to the blisters. He states is definitely not doing that anymore. His most recent hemoglobin A1c was December and registered 6.9 his ABI today was 1.1. 03/27/17 on evaluation today patient appears to be doing great in regard to his right great toe ulcer. He has been tolerating the dressing changes without complication. The good news is this is making excellent progress and he seems to be caring for this in an excellent fashion. I see no evidence of breakdown that would make me concerned that he was at risk for infection/amputation. This has obviously been his concern due to the fifth toe amputation that was necessitated previous when he had a similar issue. Nonetheless this seems to be progressing much more nicely. Readmission: 03/13/2019 upon evaluation today patient presents for reevaluation here in the clinic concerning issues he has been having with his right foot on the lateral portion at the proximal end of the metatarsal. Subsequently he tells me that this just opened up in the past couple of days or so and the erythema really began about 24 to 48 hours ago. Fortunately there is no signs of systemic infection. 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 That left him with this bony prominence that is where the wound is currently. There is erythema surrounding that does have me concerned about cellulitis. With that being said he was noncompressible as far as ABIs are concerned I do think we can need to check into arterial studies at this point. Patient does have a history of hypertension along with diabetes mellitus type 2. He also tends to develop quite a bit of callus white  often. 03/20/2019 upon evaluation today patient appears to be doing very well in regard to his wounds currently. He has been tolerating the dressing changes without complication. Fortunately there is no signs of infection. His ABIs were good at this point and registered at 1.07 on the left and 1.09 on the right. In regard to the x-ray this was negative as well for any signs of osteomyelitis. The patient also seems to be doing better in regard to the infection. He still has several days of the antibiotic left at this point best the Bactrim DS and he seems to be doing very well with this. Overall I am extremely pleased with the progress in 1 week's time he is going require some sharp debridement today however. 03/27/2019 upon evaluation today patient actually appears to be making some progress in regard to his wound. It is a little deeper but measuring smaller which is good news due to the fact that again were clear away some of the necrotic tissue which is why the depth is increasing  a little bit. Nonetheless after like were getting very close to being down at a good wound bed. Fortunately there is no signs of active infection at this time. There is a little bit of erythema immediately surrounding the wound that we do want to be very cognizant of and careful about. For that reason I am going to go ahead and extend his antibiotic today for an additional 10 days that is the Bactrim. 04/03/2019 upon evaluation today patient appears to be making some progress. I do feel like the Annitta Needs is doing a good job along with the alginate is being packed into the wound space behind. He has been tolerating the dressing changes without complication. Fortunately there is no signs of active infection at this time. No fevers, chills, nausea, vomiting, or diarrhea. 04/10/2019 upon evaluation today patient appears to be making progress. The wound is not quite as deep but he still does not have a great wound surface yet for  working on this and the Santyl does seem to be cleaning things up. Fortunately there is no evidence of active infection at this time. No fevers, chills, nausea, vomiting, or diarrhea. 04/17/2019 upon evaluation today patient appears to be doing excellent at this time in regard to his foot ulcer. I do believe the Santyl with the alginate packed in behind has been of benefit for him and I am very pleased in this regard. With that being said I am feeling like the wound surface is improving quite a bit each time I see him still I do not believe we are at the point of stating that the wound bed is perfect but again were making progress. The patient is going require some sharp debridement today. 4/22; this is a patient who is using Santyl with calcium alginate. Apparently his wound dimensions have been improving. Patient is changing the dressing himself. He has a surgical shoe. He works in a sitting position therefore is not on his feet all that much. There is undermining 05/01/2019 upon evaluation today patient appears to be doing a little better in regard to the size of his wound though he still has some depth. This does seem to be much cleaner has been using Santyl on the base of the wound followed by packing with silver alginate and behind. Nonetheless I do believe that based on what we are seeing we may need to switch to a collagen type dressing endoform may actually be excellent for him. 05/08/2019 upon evaluation today patient's wound actually appears to be showing better granulation tissue in the base of the wound. With that being said he does have some epiboly noted around the edges of the wound especially plantar and more distal. Subsequently this is can require sharp debridement today. Fortunately there is no signs of active systemic infection though I do feel like there is some local cellulitis noted at this point. The patient also notes has had increased drainage. 5/13; wound volume quite a bit  improved this week down to 3 mm. Patient states pain and drainage better. Culture from last week grew staph aureus [not MRSA] and this is sensitive to quinolones and he is on Levaquin. HOWEVER he also grew abundant Enterococcus faecalis treatment of choice for this is ampicillin. Quinolone coverage is unreliable 5/20 completing the Augmentin. Small wound on the right lateral foot. Thick rolled edges around the wound. Using silver alginate 05/29/2019 upon evaluation today patient appears to be doing better compared to last time I saw him. Fortunately there is no  signs of active infection and I do feel like he is actually making good progress. Overall the quality of the wound bed along with the size looks improved and the inflammation/erythema that was previously noted by myself when I saw the couple weeks back actually has also resolved and this is great. Overall very pleased with how things stand. 06/06/2019 upon evaluation today patient actually appears to be doing quite well with regard to his foot ulcer with regard to some of the granulation were seen at this point. There does not appear to be any evidence of systemic or local infection although he still has discomfort I feel like things are moving in a slow but surely correct direction. I think we may need to help fill some of the space however even though he is using the collagen we may need to have something behind this such as a plain packing strip to try to help hold the collagen in the base of the wound. 06/12/2019 upon evaluation today patient appears to be doing well with regard to his foot ulcer. This seems to be making some slow but steady progress. The wound does not appear to be as deep today compared to where it was previous. Fortunately there is no signs of active infection at this time. No fevers, chills, nausea, vomiting, or diarrhea. 06/19/2019 on evaluation today patient's wound does not appear to be doing quite as well as what I would  like to see. Fortunately there is no signs of systemic infection at this time which is great news. With that being said I do believe that the patient needs to have a wound culture as I do EVA, VALLEE. ([id number]) believe he likely has a local infection that needs to be addressed. He is also can require some debridement today as well. 06/26/2019 upon evaluation today patient's wound actually appears to be showing signs of excellent improvement today. There does not appear to be any evidence of active infection which is great news. There does not appear to be significant erythema at this point. The patient does have improvement overall in the appearance of the infection he has been taking the Augmentin the culture came back showing no organism was grown at this point but nonetheless I do believe he has been doing well with the antibiotics I would continue that at this point. Electronic Signature(s) Signed: 06/26/2019 10:59:06 AM By: [name] PA-C Entered By: [name] on 06/26/2019 10:59:06 [name] ([id number]) -------------------------------------------------------------------------------- Physical Exam Details Patient Name: BOSS, DANIELSEN Date of Service: 06/26/2019 9:45 AM Medical Record Number: [id number] Patient Account Number: [id number] Date of Birth/Sex: 1965-04-07 (54 y.o. M) Treating RN: [name] Primary Care Provider: [name] Other Clinician: Referring Provider: [name] Treating Provider/Extender: STONE III, Andreia Gandolfi Weeks in Treatment: 16 Constitutional Well-nourished and well-hydrated in no acute distress. Respiratory normal breathing without difficulty. Psychiatric this patient is able to make decisions and demonstrates good insight into disease process. Alert and Oriented x 3. pleasant and cooperative. Notes Patient's wound bed actually showed signs of good granulation and epithelization this is measuring much better  and does not appear to be nearly as erythematous as noted last week overall very pleased with where things stand today. Electronic Signature(s) Signed: 06/26/2019 11:13:27 AM By: [name] PA-C Entered By: [name] on 06/26/2019 11:13:26 [name] ([id number]) -------------------------------------------------------------------------------- Physician Orders Details Patient Name: [name] Date of Service: 06/26/2019 9:45 AM Medical Record Number: [id number] Patient Account Number: [id number]  Date of Birth/Sex: January 02, 1966 (54 y.o. 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 - in office o Dial antibacterial soap, wash wounds, rinse and pat dry prior to dressing wounds Primary Wound Dressing Wound #4 Right,Lateral Foot o Silver Alginate Secondary Dressing Wound #4 Right,Lateral Foot o Gauze and Kerlix/Conform Dressing Change Frequency Wound #4 Right,Lateral Foot o Change dressing every other day. Edema Control Wound #4 Right,Lateral Foot o Patient to wear own compression stockings - wear daily Off-Loading Wound #4 Right,Lateral Foot o Open toe surgical shoe with peg assist. Electronic Signature(s) Signed: 06/26/2019 10:24:12 AM By: [name] Signed: 06/26/2019 4:29:48 PM By: [name] PA-C Entered By: [name] on 06/26/2019 09:59:18 [name] ([id number]) -------------------------------------------------------------------------------- Problem List Details Patient Name: DOHN, STCLAIR. Date of Service: 06/26/2019 9:45 AM Medical Record Number: [id number] Patient Account Number: [id number] Date of Birth/Sex: 21-Dec-1965 (54 y.o. 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 M) Treating RN: [name] Primary Care Provider: [name] Other Clinician: Referring Provider: [name] Treating Provider/Extender: Melburn Hake, Gilberte Gorley Weeks in Treatment: 15 Subjective Chief Complaint Information obtained from Patient Right lateral foot ulcer History of Present Illness (HPI) This 54 year old male comes with an ulcerated area on the plantar aspect of the right foot which she's had for approximately a month. I have known him from a  previous visit at [facility] wound center and was treated in the months of April and May 2016 and rapidly healed a left plantar ulcer with a total contact cast. He has been a diabetic for about 16 years and tries to keep active and is fairly compliant with his diabetes management. 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 Past medical history significant for hypertension, hyperlipidemia, and status post appendectomy 1993. He does not smoke or drink alcohol. 01/14/2015 -- the patient had tolerated his total contact cast very well and had no problems and has had no systemic symptoms. However when his total contact cast was cut open he had excessive amount of purulent drainage in spite of being on antibiotics. He had had a recent x-ray done in the ER 12/22/2014 which showed IMPRESSION:No evidence of osseous erosion. Known soft tissue  ulceration is not well characterized on radiograph. Scattered vascular calcifications seen. his last hemoglobin A1c in December was 7.3. He has been on Augmentin and doxycycline for the last 2 weeks. 01/21/2015 -- his culture grew rare growth of Pantoea species an MR moderate growth of Candida parapsilosis. it is sensitive to levofloxacin. He has not heard back from the insurance company regarding his hyperbaric oxygen therapy. His MRI has not been done yet and we will try and get him an earlier date 01/28/2015 -- MRI was done last night -- IMPRESSION:1. Soft tissue ulcer overlying the plantar aspect of the fifth metatarsal head extending to the cortex. Subcortical marrow edema in the fifth metatarsal head with corresponding T1 hypointensity is concerning for early osteomyelitis of the plantar lateral aspect of the fifth metatarsal head. Chest x-ray done on 01/14/2015 shows bronchiectatic changes without infiltrate. EKG done on generally 17 2017 shows a normal sinus rhythm and is a normal EKG. 02/04/2015 -- he was asked to see Dr. [name] last week and had 2  appointments but had to cancel both due to pressures of work. Last night he has woken up with severe pain in the foot and leg and it is swollen up. No fever or no change in his blood glucose. Addendum: I spoke with Dr. [name] who kindly agreed to accept the patient for inpatient therapy and have also opened to the hospitalist Dr. [name], and discuss details of the management including PICC line and repeat cultures. 02/12/2015-- -- was seen by Dr. [name] in the hospital and a PICC line was placed. He was to receive Ceftazidime 2 g every 12 hourly, oral levofloxacin 750 mg every 24 hourly and oral fluconazole 200 mg daily. The antibiotics were to be given for 4 weeks except the Diflucan was to be given for the first 2 weeks. Reviewed note from 02/10/2015 -- and Dr. [name] had recommended management for growth of MSSA and Serratia. He switched him from ceftazidime to ceftriaxone 2 g every 24 hours. Levofloxacin was stopped and he would continue on fluconazole for another week. He had asked me to decide whether further imaging was necessary and whether surgical debridement of the infected bone was needed. He is doing well and has been off work for this week and we will keep him off the next week. 02/22/2015 -- he was seen by my colleague on 01/19/2015 and at that time an incision and drainage was done on his right lateral forefoot on the dorsum. Today when I probed this wound it is frankly draining pus and it communicates with the ulcer on the plantar aspect of his right foot. The patient is still on IV ceftriaxone 2 g every 24 hours and is to be seen by Dr. [name] on Friday. 03/19/2015 -- On 03/04/2015 I spoke to Dr. 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 He recommended operative debridement which would probably result in the  fifth metatarsal head and toe amputation.The patient would be referred back to [place] once he was done with surgery. He was admitted to [facility] yesterday and had surgery done by podiatry for a right fifth metatarsal acute osteomyelitis with cellulitis and abscess. He had a right foot incision and drainage with fifth metatarsal partial amputation and removal of toe infected bone and soft tissue with cultures. The wound was  partially closed and packing of the distal end was done. Patient was already on cefepime 2 g IV every 8 hourly and put on vancomycin pending final cultures. He had grown moderate gram-negative rods, and later found to be rare diphtheroids. I received a call from Dr. Cannon Kettle the podiatrist and we discussed the above. On 03/10/2015 he was found positive for influenza a and has been put on Tamiflu. Since his discharge he has been seen by Dr. Cannon Kettle who is planning to remove his sutures this coming week. He was reviewed by Dr. [name] on 03/17/2015 and his Diflucan was stopped and vancomycin. After the 3 doses he is taking. He is going to change Ceftazidime to Zosyn 3.375 g IV every 8 hours. 03/25/2015 -- he was seen by the podiatrist a couple of days ago and the sutures were removed. She will follow back with him in 4 weeks' time and at that time x-rays will be taken and a custom molded insert would be made for his shoe. 04/01/2015 -- he was seen by Dr. [name] on 03/29/2015 who pulled the PICC line stopped his IV antibiotics and recommended starting doxycycline and levofloxacin for 2 weeks. He also stop the fluconazole. The patient will follow up with him only when necessary. HOUSTON, ZAPIEN ([id number]) Readmission: 03/15/17 on evaluation today patient presents for initial evaluation concerning the new issues although he has previously been evaluated in our clinic. Unfortunately the previous evaluation led to the patient having to proceed to amputation and so he  is somewhat nervous about being here today. With that being said he has a very slight blister that occurred on the plantar aspect more medial on the right great toe that has been present for just a very short amount of time, several days. With that being said he felt like initially when he called that he somewhat overreacted but due to the fact that his previous issue led to amputation he is very cautious these days I explained that he did the right thing. With that being said he has been tolerating the dressing changes without complication mainly he just been covering this. He does not have any discomfort in secondary to neuropathy it's unlikely that he feels much. With that being said he does tell me that the issue that he had here is that he went barefoot when he knows he should not have which subsequently led to the blisters. He states is definitely not doing that anymore. His most recent hemoglobin A1c was December and registered 6.9 his ABI today was 1.1. 03/27/17 on evaluation today patient appears to be doing great in regard to his right great toe ulcer. He has been tolerating the dressing changes without complication. The good news is this is making excellent progress and he seems to be caring for this in an excellent fashion. I see no evidence of breakdown that would make me concerned that he was at risk for infection/amputation. This has obviously been his concern due to the fifth toe amputation that was necessitated previous when he had a similar issue. Nonetheless this seems to be progressing much more nicely. Readmission: 03/13/2019 upon evaluation today patient presents for reevaluation here in the clinic concerning issues he has been having with his right foot on the lateral portion at the proximal end of the metatarsal. Subsequently he tells me that this just opened up in the past couple of days or so and the erythema really began about 24 to 48 hours ago. Fortunately there is no signs  of systemic infection. 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 That left him with this bony prominence that is where the wound is currently. There is erythema surrounding that does have me concerned about cellulitis. With that being said he was noncompressible as far as ABIs are concerned I do think we can need to check into arterial studies at this point. Patient does have a history of hypertension along with diabetes mellitus type 2. He also tends to develop quite a bit of callus white often. 03/20/2019 upon evaluation today patient appears to be doing very well in regard to his wounds currently. He has been tolerating the dressing changes without complication. Fortunately there is no signs of infection. His ABIs were good at this point and registered at 1.07 on the left and 1.09 on the right. In regard to the x-ray this was negative as well for any signs of osteomyelitis. The patient also seems to be doing better in regard to the infection. He still has several days of the antibiotic left at this point best the Bactrim DS and he seems to be doing very well with this. Overall I am extremely pleased with the progress in 1 week's time he is going require some sharp debridement today however. 03/27/2019 upon evaluation today patient actually appears to be making some progress in regard to his wound. It is a little deeper but measuring smaller which is good news due to the fact that again were clear away some of the necrotic tissue which is why the depth is increasing a little bit. Nonetheless after like were getting very close to being down at a good wound bed. Fortunately there is no signs of active infection at this time. There is a little bit of erythema immediately surrounding the wound that we do want to be very cognizant of and careful about. For that reason I am going to go ahead and extend his antibiotic today for an additional 10 days that is the  Bactrim. 04/03/2019 upon evaluation today patient appears to be making some progress. I do feel like the Annitta Needs is doing a good job along with the alginate is being packed into the wound space behind. He has been tolerating the dressing changes without complication. Fortunately there is no signs of active infection at this time. No fevers, chills, nausea, vomiting, or diarrhea. 04/10/2019 upon evaluation today patient appears to be making progress. The wound is not quite as deep but he still does not have a great wound surface yet for working on this and the Santyl does seem to be cleaning things up. Fortunately there is no evidence of active infection at this time. No fevers, chills, nausea, vomiting, or diarrhea. 04/17/2019 upon evaluation today patient appears to be doing excellent at this time in regard to his foot ulcer. I do believe the Santyl with the alginate packed in behind has been of benefit for him and I am very pleased in this regard. With that being said I am feeling like the wound surface is improving quite a bit each time I see him still I do not believe we are at the point of stating that the wound bed is perfect but again were making progress. The patient is going require some sharp debridement today. 4/22; this is a patient who is using Santyl with calcium alginate. Apparently his wound dimensions have been improving. Patient is changing the dressing himself. He has a surgical shoe. He works in  a sitting position therefore is not on his feet all that much. There is undermining 05/01/2019 upon evaluation today patient appears to be doing a little better in regard to the size of his wound though he still has some depth. This does seem to be much cleaner has been using Santyl on the base of the wound followed by packing with silver alginate and behind. Nonetheless I do believe that based on what we are seeing we may need to switch to a collagen type dressing endoform may actually be  excellent for him. 05/08/2019 upon evaluation today patient's wound actually appears to be showing better granulation tissue in the base of the wound. With that being said he does have some epiboly noted around the edges of the wound especially plantar and more distal. Subsequently this is can require sharp debridement today. Fortunately there is no signs of active systemic infection though I do feel like there is some local cellulitis noted at this point. The patient also notes has had increased drainage. 5/13; wound volume quite a bit improved this week down to 3 mm. Patient states pain and drainage better. Culture from last week grew staph aureus [not MRSA] and this is sensitive to quinolones and he is on Levaquin. HOWEVER he also grew abundant Enterococcus faecalis treatment of choice for this is ampicillin. Quinolone coverage is unreliable 5/20 completing the Augmentin. Small wound on the right lateral foot. Thick rolled edges around the wound. Using silver alginate 05/29/2019 upon evaluation today patient appears to be doing better compared to last time I saw him. Fortunately there is no signs of active infection and I do feel like he is actually making good progress. Overall the quality of the wound bed along with the size looks improved and the inflammation/erythema that was previously noted by myself when I saw the couple weeks back actually has also resolved and this is great. Overall very pleased with how things stand. 06/06/2019 upon evaluation today patient actually appears to be doing quite well with regard to his foot ulcer with regard to some of the granulation were seen at this point. There does not appear to be any evidence of systemic or local infection although he still has discomfort I feel like things are moving in a slow but surely correct direction. I think we may need to help fill some of the space however even though he is using the collagen we may need to have something behind  this such as a plain packing strip to try to help hold the collagen in the base of the wound. 06/12/2019 upon evaluation today patient appears to be doing well with regard to his foot ulcer. This seems to be making some slow but steady progress. The wound does not appear to be as deep today compared to where it was previous. Fortunately there is no signs of active infection at DAYNE, CHAIT. ([id number]) this time. No fevers, chills, nausea, vomiting, or diarrhea. 06/19/2019 on evaluation today patient's wound does not appear to be doing quite as well as what I would like to see. Fortunately there is no signs of systemic infection at this time which is great news. With that being said I do believe that the patient needs to have a wound culture as I do believe he likely has a local infection that needs to be addressed. He is also can require some debridement today as well. 06/26/2019 upon evaluation today patient's wound actually appears to be showing signs of excellent improvement today. There  does not appear to be any evidence of active infection which is great news. There does not appear to be significant erythema at this point. The patient does have improvement overall in the appearance of the infection he has been taking the Augmentin the culture came back showing no organism was grown at this point but nonetheless I do believe he has been doing well with the antibiotics I would continue that at this point. Objective Constitutional Well-nourished and well-hydrated in no acute distress. Vitals Time Taken: 9:48 AM, Height: 74 in, Weight: 308 lbs, BMI: 39.5, Temperature: 98.4 F, Pulse: 75 bpm, Respiratory Rate: 16 breaths/min, Blood Pressure: 144/70 mmHg. Respiratory normal breathing without difficulty. Psychiatric this patient is able to make decisions and demonstrates good insight into disease process. Alert and Oriented x 3. pleasant and cooperative. General Notes: Patient's wound bed  actually showed signs of good granulation and epithelization this is measuring much better and does not appear to be nearly as erythematous as noted last week overall very pleased with where things stand today. Integumentary (Hair, Skin) Wound #4 status is Open. Original cause of wound was Gradually Appeared. The wound is located on the Right,Lateral Foot. The wound measures 0.4cm length x 0.2cm width x 0.7cm depth; 0.063cm^2 area and 0.044cm^3 volume. There is Fat Layer (Subcutaneous Tissue) Exposed exposed. There is a medium amount of purulent drainage noted. The wound margin is flat and intact. There is medium (34-66%) pink granulation within the wound bed. There is a medium (34-66%) amount of necrotic tissue within the wound bed including Adherent Slough. Assessment Active Problems ICD-10 Type 2 diabetes mellitus with foot ulcer 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d Cellulitis of right lower limb Corns and callosities Essential (primary) hypertension Acquired absence of other left toe(s) Plan Wound Cleansing: Wound #4 Right,Lateral Foot: Clean wound with Normal Saline. - in office Dial antibacterial soap, wash wounds, rinse and pat dry prior to dressing wounds Primary Wound Dressing: Wound #4 Right,Lateral Foot: Silver Alginate Secondary Dressing: Wound #4 Right,Lateral Foot: ORYON, GARY ([id number]) Gauze and Kerlix/Conform Dressing Change Frequency: Wound #4 Right,Lateral Foot: Change dressing every other day. Edema Control: Wound #4 Right,Lateral Foot: Patient to wear own compression stockings - wear daily Off-Loading: Wound #4 Right,Lateral Foot: Open toe surgical shoe with peg assist. 1. I would recommend currently that we go ahead and continue with the wound care measures as before with regard to the silver alginate dressing with a small piece tucked into the slit area at this point there does not appear to be any signs  of infection and overall I feel like this is measuring much better which is great news. 2. I recommend that the patient continue his antibiotic to completion think he may be close to finishing up with the Augmentin over the next week. 3. I am also going to recommend that the patient continue to monitor for any signs of worsening if anything shows up he will let me know. We will see patient back for reevaluation in 1 week here in the clinic. If anything worsens or changes patient will contact our office for additional recommendations. Electronic Signature(s) Signed: 06/26/2019 11:14:10 AM By: [name] PA-C Entered By: [name] on 06/26/2019 11:14:09 [name] ([id number]) -------------------------------------------------------------------------------- SuperBill Details Patient Name: [name] Date of Service: 06/26/2019 Medical Record Number: [id number] Patient Account Number: [id number] Date of Birth/Sex: 09-08-1965 (54 y.o. M) Treating RN: [name] Primary Care Provider: [name] Other  Clinician: Referring Provider: [name] Treating Provider/Extender: Melburn Hake, Souleymane Saiki Weeks in Treatment: 15 Diagnosis Coding ICD-10 Codes Code Description E11.621 Type 2 diabetes mellitus with foot ulcer L97.512 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d L03.115 Cellulitis of right lower limb L84 Corns and callosities I10 Essential (primary) hypertension Z89.422 Acquired absence of other left toe(s) Facility Procedures CPT4 Code: 28003491 Description: 99213 - WOUND CARE VISIT-LEV 3 EST PT Modifier: Quantity: 1 Physician Procedures CPT4 Code: 7915056 Description: 97948 - WC PHYS LEVEL 3 - EST PT Modifier: Quantity: 1 CPT4 Code: Description: ICD-10 Diagnosis Description E11.621 Type 2 diabetes mellitus with foot ulcer L97.512 Non-pressure chronic ulcer of other part of right foot with fat layer e L03.115  Cellulitis of right lower limb L84 Corns and callosities Modifier: xposed Quantity: Electronic Signature(s) Signed: 06/26/2019 11:14:28 AM By: [name] PA-C Entered By: [name] on 06/26/2019 11:14:24

## 2019-07-03 ENCOUNTER — Other Ambulatory Visit: Payer: Self-pay

## 2019-07-03 ENCOUNTER — Encounter: Payer: Managed Care, Other (non HMO) | Attending: Physician Assistant | Admitting: Physician Assistant

## 2019-07-03 DIAGNOSIS — I10 Essential (primary) hypertension: Secondary | ICD-10-CM | POA: Diagnosis not present

## 2019-07-03 DIAGNOSIS — E11621 Type 2 diabetes mellitus with foot ulcer: Secondary | ICD-10-CM | POA: Insufficient documentation

## 2019-07-03 DIAGNOSIS — L97512 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d: Secondary | ICD-10-CM | POA: Insufficient documentation

## 2019-07-03 DIAGNOSIS — L03115 Cellulitis of right lower limb: Secondary | ICD-10-CM | POA: Insufficient documentation

## 2019-07-03 DIAGNOSIS — E785 Hyperlipidemia, unspecified: Secondary | ICD-10-CM | POA: Insufficient documentation

## 2019-07-03 DIAGNOSIS — L84 Corns and callosities: Secondary | ICD-10-CM | POA: Diagnosis not present

## 2019-07-03 DIAGNOSIS — Z89422 Acquired absence of other left toe(s): Secondary | ICD-10-CM | POA: Insufficient documentation

## 2019-07-03 NOTE — Progress Notes (Signed)
[name], [name] ([id number]) Visit Report for 07/03/2019 Chief Complaint Document Details Patient Name: [name], [name]. Date of Service: 07/03/2019 8:15 AM Medical Record Number: [id number] Patient Account Number: [id number] Date of Birth/Sex: October 30, 1965 (54 y.o. M) Treating RN: [name] Primary Care Provider: [name] Other Clinician: Referring Provider: [name] Treating Provider/Extender: Melburn Hake, Jerriah Ines Weeks in Treatment: 16 Information Obtained from: Patient Chief Complaint Right lateral foot ulcer Electronic Signature(s) Signed: 07/03/2019 9:02:42 AM By: [name] PA-C Entered By: [name] on 07/03/2019 09:02:42 [name] ([id number]) -------------------------------------------------------------------------------- Debridement Details Patient Name: [name] Date of Service: 07/03/2019 8:15 AM Medical Record Number: [id number] Patient Account Number: [id number] Date of Birth/Sex: November 04, 1965 (54 y.o. M) Treating RN: [name] Primary Care Provider: [name] Other Clinician: Referring Provider: [name] Treating Provider/Extender: Melburn Hake, Bishoy Cupp Weeks in Treatment: 16 Debridement Performed for Wound #4 Right,Lateral Foot Assessment: Performed By: Physician STONE III, Inger Wiest E., PA-C Debridement Type: Debridement Severity of Tissue Pre Debridement: Fat layer exposed Level of Consciousness (Pre- Awake and Alert procedure): Pre-procedure Verification/Time Out Yes - 08:48 Taken: Start Time: 08:49 Pain Control: Lidocaine Total Area Debrided (L x W): 0.3 (cm) x 0.7 (cm) = 0.21 (cm) Tissue and other material Viable, Non-Viable, Callus, Subcutaneous debrided: Level: Skin/Subcutaneous Tissue Debridement Description: Excisional Instrument: Curette Bleeding: Minimum Hemostasis Achieved: Pressure End Time: 08:51 Response to Treatment: Procedure was tolerated well Level of Consciousness  (Post- Awake and Alert procedure): Post Debridement Measurements of Total Wound Length: (cm) 0.3 Width: (cm) 0.7 Depth: (cm) 0.7 Volume: (cm) 0.115 Character of Wound/Ulcer Post Debridement: Stable Severity of Tissue Post Debridement: Fat layer exposed Post Procedure Diagnosis Same as Pre-procedure Electronic Signature(s) Signed: 07/03/2019 3:55:13 PM By: [name] Signed: 07/03/2019 4:07:16 PM By: [name] PA-C Entered By: [name] on 07/03/2019 08:50:18 [name] ([id number]) -------------------------------------------------------------------------------- HPI Details Patient Name: [name] Date of Service: 07/03/2019 8:15 AM Medical Record Number: [id number] Patient Account Number: [id number] Date of Birth/Sex: [birth year] (54 y.o. M) Treating RN: [name] Primary Care Provider: [name] Other Clinician: Referring Provider: [name] Treating Provider/Extender: STONE III, Diannah Rindfleisch Weeks in Treatment: 16 History of Present Illness HPI Description: This 54 year old male comes with an ulcerated area on the plantar aspect of the right foot which she's had for approximately a month. I have known him from a previous visit at [facility] wound center and was treated in the months of April and May 2016 and rapidly healed a left plantar ulcer with a total contact cast. He has been a diabetic for about 16 years and tries to keep active and is fairly compliant with his diabetes management. 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 Past medical history significant for hypertension, hyperlipidemia, and status post appendectomy 1993. He does not smoke or drink alcohol. 01/14/2015 -- the patient had tolerated his total contact cast very well and had no problems and has had no systemic symptoms. However when his total contact cast was cut open he had excessive amount of purulent drainage in spite of being on antibiotics. He had had a recent x-ray  done in the ER 12/22/2014 which showed IMPRESSION:No evidence of osseous erosion. Known soft tissue ulceration is not well characterized on radiograph. Scattered vascular calcifications seen. his last hemoglobin A1c in December was 7.3. He has been on Augmentin and doxycycline for the last 2 weeks. 01/21/2015 -- his culture grew rare growth of Pantoea species an MR moderate growth of Candida parapsilosis. it is sensitive  to levofloxacin. He has not heard back from the insurance company regarding his hyperbaric oxygen therapy. His MRI has not been done yet and we will try and get him an earlier date 01/28/2015 -- MRI was done last night -- IMPRESSION:1. Soft tissue ulcer overlying the plantar aspect of the fifth metatarsal head extending to the cortex. Subcortical marrow edema in the fifth metatarsal head with corresponding T1 hypointensity is concerning for early osteomyelitis of the plantar lateral aspect of the fifth metatarsal head. Chest x-ray done on 01/14/2015 shows bronchiectatic changes without infiltrate. EKG done on generally 17 2017 shows a normal sinus rhythm and is a normal EKG. 02/04/2015 -- he was asked to see Dr. [name] last week and had 2 appointments but had to cancel both due to pressures of work. Last night he has woken up with severe pain in the foot and leg and it is swollen up. No fever or no change in his blood glucose. Addendum: I spoke with Dr. [name] who kindly agreed to accept the patient for inpatient therapy and have also opened to the hospitalist Dr. [name], and discuss details of the management including PICC line and repeat cultures. 02/12/2015-- -- was seen by Dr. [name] in the hospital and a PICC line was placed. He was to receive Ceftazidime 2 g every 12 hourly, oral levofloxacin 750 mg every 24 hourly and oral fluconazole 200 mg daily. The antibiotics were to be given for 4 weeks except the Diflucan was to be given for the first 2 weeks. Reviewed note  from 02/10/2015 -- and Dr. [name] had recommended management for growth of MSSA and Serratia. He switched him from ceftazidime to ceftriaxone 2 g every 24 hours. Levofloxacin was stopped and he would continue on fluconazole for another week. He had asked me to decide whether further imaging was necessary and whether surgical debridement of the infected bone was needed. He is doing well and has been off work for this week and we will keep him off the next week. 02/22/2015 -- he was seen by my colleague on 01/19/2015 and at that time an incision and drainage was done on his right lateral forefoot on the dorsum. Today when I probed this wound it is frankly draining pus and it communicates with the ulcer on the plantar aspect of his right foot. The patient is still on IV ceftriaxone 2 g every 24 hours and is to be seen by Dr. [name] on Friday. 03/19/2015 -- On 03/04/2015 I spoke to Dr. 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 He recommended operative debridement which would probably result in the fifth metatarsal head and toe amputation.The patient would be referred back to [place] once he was done with surgery. He was admitted to [facility] yesterday and had surgery done by podiatry for a right fifth metatarsal acute osteomyelitis with cellulitis and abscess. He had a right foot incision and drainage with fifth metatarsal partial amputation and removal of toe infected bone and soft tissue with cultures. The wound was partially closed and packing of the distal end was done. Patient was already on cefepime 2 g IV every 8 hourly and put on vancomycin pending final cultures. He had grown moderate gram-negative rods, and later found to be rare diphtheroids. I received a call from Dr. Cannon Kettle the podiatrist and we  discussed the above. On  03/10/2015 he was found positive for influenza a and has been put on Tamiflu. Since his discharge he has been seen by Dr. Cannon Kettle who is planning to remove his sutures this coming week. He was reviewed by Dr. [name] on 03/17/2015 and his Diflucan was stopped and vancomycin. After the 3 doses he is taking. He is going to change Ceftazidime to Zosyn 3.375 g IV every 8 hours. 03/25/2015 -- he was seen by the podiatrist a couple of days ago and the sutures were removed. She will follow back with him in 4 weeks' time and at that time x-rays will be taken and a custom molded insert would be made for his shoe. 04/01/2015 -- he was seen by Dr. [name] on 03/29/2015 who pulled the PICC line stopped his IV antibiotics and recommended starting doxycycline and levofloxacin for 2 weeks. He also stop the fluconazole. The patient will follow up with him only when necessary. Readmission: 03/15/17 on evaluation today patient presents for initial evaluation concerning the new issues although he has previously been evaluated in our clinic. Unfortunately the previous evaluation led to the patient having to proceed to amputation and so he is somewhat nervous about being here [name], [name] ([id number]) today. With that being said he has a very slight blister that occurred on the plantar aspect more medial on the right great toe that has been present for just a very short amount of time, several days. With that being said he felt like initially when he called that he somewhat overreacted but due to the fact that his previous issue led to amputation he is very cautious these days I explained that he did the right thing. With that being said he has been tolerating the dressing changes without complication mainly he just been covering this. He does not have any discomfort in secondary to neuropathy it's unlikely that he feels much. With that being said he does tell me that the issue that he had here is that he  went barefoot when he knows he should not have which subsequently led to the blisters. He states is definitely not doing that anymore. His most recent hemoglobin A1c was December and registered 6.9 his ABI today was 1.1. 03/27/17 on evaluation today patient appears to be doing great in regard to his right great toe ulcer. He has been tolerating the dressing changes without complication. The good news is this is making excellent progress and he seems to be caring for this in an excellent fashion. I see no evidence of breakdown that would make me concerned that he was at risk for infection/amputation. This has obviously been his concern due to the fifth toe amputation that was necessitated previous when he had a similar issue. Nonetheless this seems to be progressing much more nicely. Readmission: 03/13/2019 upon evaluation today patient presents for reevaluation here in the clinic concerning issues he has been having with his right foot on the lateral portion at the proximal end of the metatarsal. Subsequently he tells me that this just opened up in the past couple of days or so and the erythema really began about 24 to 48 hours ago. Fortunately there is no signs of systemic infection. 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 That left him with this bony prominence that is where the wound is currently. There is erythema surrounding that does have me concerned about cellulitis. With that being said he was noncompressible  as far as ABIs are concerned I do think we can need to check into arterial studies at this point. Patient does have a history of hypertension along with diabetes mellitus type 2. He also tends to develop quite a bit of callus white often. 03/20/2019 upon evaluation today patient appears to be doing very well in regard to his wounds currently. He has been tolerating the dressing changes without complication. Fortunately there is no signs of  infection. His ABIs were good at this point and registered at 1.07 on the left and 1.09 on the right. In regard to the x-ray this was negative as well for any signs of osteomyelitis. The patient also seems to be doing better in regard to the infection. He still has several days of the antibiotic left at this point best the Bactrim DS and he seems to be doing very well with this. Overall I am extremely pleased with the progress in 1 week's time he is going require some sharp debridement today however. 03/27/2019 upon evaluation today patient actually appears to be making some progress in regard to his wound. It is a little deeper but measuring smaller which is good news due to the fact that again were clear away some of the necrotic tissue which is why the depth is increasing a little bit. Nonetheless after like were getting very close to being down at a good wound bed. Fortunately there is no signs of active infection at this time. There is a little bit of erythema immediately surrounding the wound that we do want to be very cognizant of and careful about. For that reason I am going to go ahead and extend his antibiotic today for an additional 10 days that is the Bactrim. 04/03/2019 upon evaluation today patient appears to be making some progress. I do feel like the Annitta Needs is doing a good job along with the alginate is being packed into the wound space behind. He has been tolerating the dressing changes without complication. Fortunately there is no signs of active infection at this time. No fevers, chills, nausea, vomiting, or diarrhea. 04/10/2019 upon evaluation today patient appears to be making progress. The wound is not quite as deep but he still does not have a great wound surface yet for working on this and the Santyl does seem to be cleaning things up. Fortunately there is no evidence of active infection at this time. No fevers, chills, nausea, vomiting, or diarrhea. 04/17/2019 upon evaluation today  patient appears to be doing excellent at this time in regard to his foot ulcer. I do believe the Santyl with the alginate packed in behind has been of benefit for him and I am very pleased in this regard. With that being said I am feeling like the wound surface is improving quite a bit each time I see him still I do not believe we are at the point of stating that the wound bed is perfect but again were making progress. The patient is going require some sharp debridement today. 4/22; this is a patient who is using Santyl with calcium alginate. Apparently his wound dimensions have been improving. Patient is changing the dressing himself. He has a surgical shoe. He works in a sitting position therefore is not on his feet all that much. There is undermining 05/01/2019 upon evaluation today patient appears to be doing a little better in regard to the size of his wound though he still has some depth. This does seem to be much cleaner has been  using Santyl on the base of the wound followed by packing with silver alginate and behind. Nonetheless I do believe that based on what we are seeing we may need to switch to a collagen type dressing endoform may actually be excellent for him. 05/08/2019 upon evaluation today patient's wound actually appears to be showing better granulation tissue in the base of the wound. With that being said he does have some epiboly noted around the edges of the wound especially plantar and more distal. Subsequently this is can require sharp debridement today. Fortunately there is no signs of active systemic infection though I do feel like there is some local cellulitis noted at this point. The patient also notes has had increased drainage. 5/13; wound volume quite a bit improved this week down to 3 mm. Patient states pain and drainage better. Culture from last week grew staph aureus [not MRSA] and this is sensitive to quinolones and he is on Levaquin. HOWEVER he also grew abundant  Enterococcus faecalis treatment of choice for this is ampicillin. Quinolone coverage is unreliable 5/20 completing the Augmentin. Small wound on the right lateral foot. Thick rolled edges around the wound. Using silver alginate 05/29/2019 upon evaluation today patient appears to be doing better compared to last time I saw him. Fortunately there is no signs of active infection and I do feel like he is actually making good progress. Overall the quality of the wound bed along with the size looks improved and the inflammation/erythema that was previously noted by myself when I saw the couple weeks back actually has also resolved and this is great. Overall very pleased with how things stand. 06/06/2019 upon evaluation today patient actually appears to be doing quite well with regard to his foot ulcer with regard to some of the granulation were seen at this point. There does not appear to be any evidence of systemic or local infection although he still has discomfort I feel like things are moving in a slow but surely correct direction. I think we may need to help fill some of the space however even though he is using the collagen we may need to have something behind this such as a plain packing strip to try to help hold the collagen in the base of the wound. 06/12/2019 upon evaluation today patient appears to be doing well with regard to his foot ulcer. This seems to be making some slow but steady progress. The wound does not appear to be as deep today compared to where it was previous. Fortunately there is no signs of active infection at this time. No fevers, chills, nausea, vomiting, or diarrhea. 06/19/2019 on evaluation today patient's wound does not appear to be doing quite as well as what I would like to see. Fortunately there is no signs of systemic infection at this time which is great news. With that being said I do believe that the patient needs to have a wound culture as I do [name], [name].  ([id number]) believe he likely has a local infection that needs to be addressed. He is also can require some debridement today as well. 06/26/2019 upon evaluation today patient's wound actually appears to be showing signs of excellent improvement today. There does not appear to be any evidence of active infection which is great news. There does not appear to be significant erythema at this point. The patient does have improvement overall in the appearance of the infection he has been taking the Augmentin the culture came back showing no organism  was grown at this point but nonetheless I do believe he has been doing well with the antibiotics I would continue that at this point. 07/03/2019 upon evaluation today patient appears to be doing about the same in regard to his wound. There does not appear to be any evidence of active infection and overall I am pleased with where things stand. The patient is tolerating the dressing changes without complication but it does not appear that they are packing the wound at this time which I think is going to slow down his healing prospects. Electronic Signature(s) Signed: 07/03/2019 4:00:05 PM By: [name] PA-C Entered By: [name] on 07/03/2019 16:00:05 [name] ([id number]) -------------------------------------------------------------------------------- Physical Exam Details Patient Name: [name], [name] Date of Service: 07/03/2019 8:15 AM Medical Record Number: [id number] Patient Account Number: [id number] Date of Birth/Sex: 12/15/1965 (54 y.o. M) Treating RN: [name] Primary Care Provider: [name] Other Clinician: Referring Provider: [name] Treating Provider/Extender: STONE III, Bryssa Tones Weeks in Treatment: 85 Constitutional Well-nourished and well-hydrated in no acute distress. Respiratory normal breathing without difficulty. Psychiatric this patient is able to make decisions and demonstrates good insight  into disease process. Alert and Oriented x 3. pleasant and cooperative. Notes Patient's wound currently did show some signs of depth to the wound bed this does need to be packed in order to prevent fluid from collecting underneath and preventing healing. I explained this to him today and showed him about the size of what needs to be cut to pack into the area. This is something that his wife is taking care of for him as well at home. Electronic Signature(s) Signed: 07/03/2019 4:00:26 PM By: [name] PA-C Entered By: [name] on 07/03/2019 16:00:25 [name] ([id number]) -------------------------------------------------------------------------------- Physician Orders Details Patient Name: [name] Date of Service: 07/03/2019 8:15 AM Medical Record Number: [id number] Patient Account Number: [id number] Date of Birth/Sex: January 08, 1965 (54 y.o. M) Treating RN: [name] Primary Care Provider: [name] Other Clinician: Referring Provider: [name] Treating Provider/Extender: Melburn Hake, Montia Haslip Weeks in Treatment: 16 Verbal / Phone Orders: No Diagnosis Coding Wound Cleansing Wound #4 Right,Lateral Foot o Clean wound with Normal Saline. - in office o Dial antibacterial soap, wash wounds, rinse and pat dry prior to dressing wounds Primary Wound Dressing Wound #4 Right,Lateral Foot o Silver Alginate Secondary Dressing Wound #4 Right,Lateral Foot o Gauze and Kerlix/Conform Dressing Change Frequency Wound #4 Right,Lateral Foot o Change dressing every other day. Edema Control Wound #4 Right,Lateral Foot o Patient to wear own compression stockings - wear daily Off-Loading Wound #4 Right,Lateral Foot o Open toe surgical shoe with peg assist. Electronic Signature(s) Signed: 07/03/2019 3:55:13 PM By: [name] Signed: 07/03/2019 4:07:16 PM By: [name] PA-C Entered By: [name] on 07/03/2019 08:52:20 [name] ([id number]) -------------------------------------------------------------------------------- Problem List Details Patient Name: [name], [name]. Date of Service: 07/03/2019 8:15 AM Medical Record Number: [id number] Patient Account Number: [id number] Date of Birth/Sex: 1965/06/22 (54 y.o. 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 M) Treating RN: [name] Primary Care Provider: [name] Other Clinician: Referring Provider: [name] Treating Provider/Extender: Melburn Hake, Nysia Dell Weeks in Treatment: 16 Subjective Chief Complaint Information obtained from Patient Right lateral foot ulcer History of Present Illness (HPI) This 54 year old male comes with an ulcerated area on the plantar aspect of the right foot which she's had for approximately a month. I have known him from a previous visit  at [facility] wound center and was treated in the months of April and May 2016 and rapidly healed a left plantar ulcer with a total contact cast. He has been a diabetic for about 16 years and tries to keep active and is fairly compliant with his diabetes management. 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 Past medical history significant for hypertension, hyperlipidemia, and status post appendectomy 1993. He does not smoke or drink alcohol. 01/14/2015 -- the patient had tolerated his total contact cast very well and had no problems and has had no systemic symptoms. However when his total contact cast was cut open he had excessive amount of purulent drainage in spite of being on antibiotics. He had had a recent x-ray done in the ER 12/22/2014 which showed IMPRESSION:No evidence of osseous erosion. Known soft tissue ulceration is not well characterized on radiograph. Scattered vascular calcifications seen. his last hemoglobin A1c in December was 7.3. He has been on Augmentin and doxycycline for the last 2 weeks. 01/21/2015 -- his culture grew rare growth of Pantoea species an MR moderate growth of Candida parapsilosis. it is sensitive to levofloxacin. He has not heard back from the insurance company regarding his hyperbaric oxygen therapy. His MRI has not been done yet and we will try and get him an earlier date 01/28/2015 -- MRI was done last night -- IMPRESSION:1. Soft tissue ulcer overlying the plantar aspect of the fifth metatarsal head extending to the cortex. Subcortical marrow edema in the fifth metatarsal head with corresponding T1 hypointensity is concerning for early osteomyelitis of the plantar lateral aspect of the fifth metatarsal head. Chest x-ray done on 01/14/2015 shows bronchiectatic changes without infiltrate. EKG done on generally 17 2017 shows a normal sinus rhythm and is a normal EKG. 02/04/2015 -- he was asked to see Dr. [name] last week and had 2 appointments but had to  cancel both due to pressures of work. Last night he has woken up with severe pain in the foot and leg and it is swollen up. No fever or no change in his blood glucose. Addendum: I spoke with Dr. [name] who kindly agreed to accept the patient for inpatient therapy and have also opened to the hospitalist Dr. [name], and discuss details of the management including PICC line and repeat cultures. 02/12/2015-- -- was seen by Dr. [name] in the hospital and a PICC line was placed. He was to receive Ceftazidime 2 g every 12 hourly, oral levofloxacin 750 mg every 24 hourly and oral fluconazole 200 mg daily. The antibiotics were to be given for 4 weeks except the Diflucan was to be given for the first 2 weeks. Reviewed note from 02/10/2015 -- and Dr. [name]  had recommended management for growth of MSSA and Serratia. He switched him from ceftazidime to ceftriaxone 2 g every 24 hours. Levofloxacin was stopped and he would continue on fluconazole for another week. He had asked me to decide whether further imaging was necessary and whether surgical debridement of the infected bone was needed. He is doing well and has been off work for this week and we will keep him off the next week. 02/22/2015 -- he was seen by my colleague on 01/19/2015 and at that time an incision and drainage was done on his right lateral forefoot on the dorsum. Today when I probed this wound it is frankly draining pus and it communicates with the ulcer on the plantar aspect of his right foot. The patient is still on IV ceftriaxone 2 g every 24 hours and is to be seen by Dr. [name] on Friday. 03/19/2015 -- On 03/04/2015 I spoke to Dr. 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 He recommended operative debridement which would probably result in the fifth metatarsal head and  toe amputation.The patient would be referred back to [place] once he was done with surgery. He was admitted to [facility] yesterday and had surgery done by podiatry for a right fifth metatarsal acute osteomyelitis with cellulitis and abscess. He had a right foot incision and drainage with fifth metatarsal partial amputation and removal of toe infected bone and soft tissue with cultures. The wound was partially closed and packing of the distal end was done. Patient was already on cefepime 2 g IV every 8 hourly and put on vancomycin pending final cultures. He had grown moderate gram-negative rods, and later found to be rare diphtheroids. I received a call from Dr. Cannon Kettle the podiatrist and we discussed the above. On 03/10/2015 he was found positive for influenza a and has been put on Tamiflu. Since his discharge he has been seen by Dr. Cannon Kettle who is planning to remove his sutures this coming week. He was reviewed by Dr. [name] on 03/17/2015 and his Diflucan was stopped and vancomycin. After the 3 doses he is taking. He is going to change Ceftazidime to Zosyn 3.375 g IV every 8 hours. 03/25/2015 -- he was seen by the podiatrist a couple of days ago and the sutures were removed. She will follow back with him in 4 weeks' time and at that time x-rays will be taken and a custom molded insert would be made for his shoe. 04/01/2015 -- he was seen by Dr. [name] on 03/29/2015 who pulled the PICC line stopped his IV antibiotics and recommended starting doxycycline and levofloxacin for 2 weeks. He also stop the fluconazole. The patient will follow up with him only when necessary. [name], [name] ([id number]) Readmission: 03/15/17 on evaluation today patient presents for initial evaluation concerning the new issues although he has previously been evaluated in our clinic. Unfortunately the previous evaluation led to the patient having to proceed to amputation and so he is somewhat nervous about  being here today. With that being said he has a very slight blister that occurred on the plantar aspect more medial on the right great toe that has been present for just a very short amount of time, several days. With that being said he felt like initially when he called that he somewhat overreacted but due  to the fact that his previous issue led to amputation he is very cautious these days I explained that he did the right thing. With that being said he has been tolerating the dressing changes without complication mainly he just been covering this. He does not have any discomfort in secondary to neuropathy it's unlikely that he feels much. With that being said he does tell me that the issue that he had here is that he went barefoot when he knows he should not have which subsequently led to the blisters. He states is definitely not doing that anymore. His most recent hemoglobin A1c was December and registered 6.9 his ABI today was 1.1. 03/27/17 on evaluation today patient appears to be doing great in regard to his right great toe ulcer. He has been tolerating the dressing changes without complication. The good news is this is making excellent progress and he seems to be caring for this in an excellent fashion. I see no evidence of breakdown that would make me concerned that he was at risk for infection/amputation. This has obviously been his concern due to the fifth toe amputation that was necessitated previous when he had a similar issue. Nonetheless this seems to be progressing much more nicely. Readmission: 03/13/2019 upon evaluation today patient presents for reevaluation here in the clinic concerning issues he has been having with his right foot on the lateral portion at the proximal end of the metatarsal. Subsequently he tells me that this just opened up in the past couple of days or so and the erythema really began about 24 to 48 hours ago. Fortunately there is no signs of systemic infection. He  does have a history of having had osteomyelitis with fifth ray amputation on the right. That left him with this bony prominence that is where the wound is currently. There is erythema surrounding that does have me concerned about cellulitis. With that being said he was noncompressible as far as ABIs are concerned I do think we can need to check into arterial studies at this point. Patient does have a history of hypertension along with diabetes mellitus type 2. He also tends to develop quite a bit of callus white often. 03/20/2019 upon evaluation today patient appears to be doing very well in regard to his wounds currently. He has been tolerating the dressing changes without complication. Fortunately there is no signs of infection. His ABIs were good at this point and registered at 1.07 on the left and 1.09 on the right. In regard to the x-ray this was negative as well for any signs of osteomyelitis. The patient also seems to be doing better in regard to the infection. He still has several days of the antibiotic left at this point best the Bactrim DS and he seems to be doing very well with this. Overall I am extremely pleased with the progress in 1 week's time he is going require some sharp debridement today however. 03/27/2019 upon evaluation today patient actually appears to be making some progress in regard to his wound. It is a little deeper but measuring smaller which is good news due to the fact that again were clear away some of the necrotic tissue which is why the depth is increasing a little bit. Nonetheless after like were getting very close to being down at a good wound bed. Fortunately there is no signs of active infection at this time. There is a little bit of erythema immediately surrounding the wound that we do want to be very  cognizant of and careful about. For that reason I am going to go ahead and extend his antibiotic today for an additional 10 days that is the Bactrim. 04/03/2019 upon  evaluation today patient appears to be making some progress. I do feel like the Annitta Needs is doing a good job along with the alginate is being packed into the wound space behind. He has been tolerating the dressing changes without complication. Fortunately there is no signs of active infection at this time. No fevers, chills, nausea, vomiting, or diarrhea. 04/10/2019 upon evaluation today patient appears to be making progress. The wound is not quite as deep but he still does not have a great wound surface yet for working on this and the Santyl does seem to be cleaning things up. Fortunately there is no evidence of active infection at this time. No fevers, chills, nausea, vomiting, or diarrhea. 04/17/2019 upon evaluation today patient appears to be doing excellent at this time in regard to his foot ulcer. I do believe the Santyl with the alginate packed in behind has been of benefit for him and I am very pleased in this regard. With that being said I am feeling like the wound surface is improving quite a bit each time I see him still I do not believe we are at the point of stating that the wound bed is perfect but again were making progress. The patient is going require some sharp debridement today. 4/22; this is a patient who is using Santyl with calcium alginate. Apparently his wound dimensions have been improving. Patient is changing the dressing himself. He has a surgical shoe. He works in a sitting position therefore is not on his feet all that much. There is undermining 05/01/2019 upon evaluation today patient appears to be doing a little better in regard to the size of his wound though he still has some depth. This does seem to be much cleaner has been using Santyl on the base of the wound followed by packing with silver alginate and behind. Nonetheless I do believe that based on what we are seeing we may need to switch to a collagen type dressing endoform may actually be excellent for him. 05/08/2019  upon evaluation today patient's wound actually appears to be showing better granulation tissue in the base of the wound. With that being said he does have some epiboly noted around the edges of the wound especially plantar and more distal. Subsequently this is can require sharp debridement today. Fortunately there is no signs of active systemic infection though I do feel like there is some local cellulitis noted at this point. The patient also notes has had increased drainage. 5/13; wound volume quite a bit improved this week down to 3 mm. Patient states pain and drainage better. Culture from last week grew staph aureus [not MRSA] and this is sensitive to quinolones and he is on Levaquin. HOWEVER he also grew abundant Enterococcus faecalis treatment of choice for this is ampicillin. Quinolone coverage is unreliable 5/20 completing the Augmentin. Small wound on the right lateral foot. Thick rolled edges around the wound. Using silver alginate 05/29/2019 upon evaluation today patient appears to be doing better compared to last time I saw him. Fortunately there is no signs of active infection and I do feel like he is actually making good progress. Overall the quality of the wound bed along with the size looks improved and the inflammation/erythema that was previously noted by myself when I saw the couple weeks back actually has also  resolved and this is great. Overall very pleased with how things stand. 06/06/2019 upon evaluation today patient actually appears to be doing quite well with regard to his foot ulcer with regard to some of the granulation were seen at this point. There does not appear to be any evidence of systemic or local infection although he still has discomfort I feel like things are moving in a slow but surely correct direction. I think we may need to help fill some of the space however even though he is using the collagen we may need to have something behind this such as a plain packing  strip to try to help hold the collagen in the base of the wound. 06/12/2019 upon evaluation today patient appears to be doing well with regard to his foot ulcer. This seems to be making some slow but steady progress. The wound does not appear to be as deep today compared to where it was previous. Fortunately there is no signs of active infection at [name], [name]. ([id number]) this time. No fevers, chills, nausea, vomiting, or diarrhea. 06/19/2019 on evaluation today patient's wound does not appear to be doing quite as well as what I would like to see. Fortunately there is no signs of systemic infection at this time which is great news. With that being said I do believe that the patient needs to have a wound culture as I do believe he likely has a local infection that needs to be addressed. He is also can require some debridement today as well. 06/26/2019 upon evaluation today patient's wound actually appears to be showing signs of excellent improvement today. There does not appear to be any evidence of active infection which is great news. There does not appear to be significant erythema at this point. The patient does have improvement overall in the appearance of the infection he has been taking the Augmentin the culture came back showing no organism was grown at this point but nonetheless I do believe he has been doing well with the antibiotics I would continue that at this point. 07/03/2019 upon evaluation today patient appears to be doing about the same in regard to his wound. There does not appear to be any evidence of active infection and overall I am pleased with where things stand. The patient is tolerating the dressing changes without complication but it does not appear that they are packing the wound at this time which I think is going to slow down his healing prospects. Objective Constitutional Well-nourished and well-hydrated in no acute distress. Vitals Time Taken: 8:25 AM, Height: 74  in, Weight: 308 lbs, BMI: 39.5, Temperature: 98.4 F, Pulse: 76 bpm, Respiratory Rate: 18 breaths/min, Blood Pressure: 158/72 mmHg. Respiratory normal breathing without difficulty. Psychiatric this patient is able to make decisions and demonstrates good insight into disease process. Alert and Oriented x 3. pleasant and cooperative. General Notes: Patient's wound currently did show some signs of depth to the wound bed this does need to be packed in order to prevent fluid from collecting underneath and preventing healing. I explained this to him today and showed him about the size of what needs to be cut to pack into the area. This is something that his wife is taking care of for him as well at home. Integumentary (Hair, Skin) Wound #4 status is Open. Original cause of wound was Gradually Appeared. The wound is located on the Right,Lateral Foot. The wound measures 0.3cm length x 0.7cm width x 0.7cm depth; 0.165cm^2 area and 0.115cm^3  volume. There is Fat Layer (Subcutaneous Tissue) Exposed exposed. There is no tunneling or undermining noted. There is a medium amount of purulent drainage noted. The wound margin is flat and intact. There is medium (34-66%) pink granulation within the wound bed. There is a medium (34-66%) amount of necrotic tissue within the wound bed including Adherent Slough. Assessment Active Problems ICD-10 Type 2 diabetes mellitus with foot ulcer 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d Cellulitis of right lower limb Corns and callosities Essential (primary) hypertension Acquired absence of other left toe(s) Procedures Wound #4 Pre-procedure diagnosis of Wound #4 is a Diabetic Wound/Ulcer of the Lower Extremity located on the Right,Lateral Foot .Severity of Tissue Pre Debridement is: Fat layer exposed. There was a Excisional Skin/Subcutaneous Tissue Debridement with a total area of 0.21 sq cm performed by [name]  ([id number]) STONE III, Najib Colmenares E., PA-C. With the following instrument(s): Curette to remove Viable and Non-Viable tissue/material. Material removed includes Callus and Subcutaneous Tissue and after achieving pain control using Lidocaine. A time out was conducted at 08:48, prior to the start of the procedure. A Minimum amount of bleeding was controlled with Pressure. The procedure was tolerated well. Post Debridement Measurements: 0.3cm length x 0.7cm width x 0.7cm depth; 0.115cm^3 volume. Character of Wound/Ulcer Post Debridement is stable. Severity of Tissue Post Debridement is: Fat layer exposed. Post procedure Diagnosis Wound #4: Same as Pre-Procedure Plan Wound Cleansing: Wound #4 Right,Lateral Foot: Clean wound with Normal Saline. - in office Dial antibacterial soap, wash wounds, rinse and pat dry prior to dressing wounds Primary Wound Dressing: Wound #4 Right,Lateral Foot: Silver Alginate Secondary Dressing: Wound #4 Right,Lateral Foot: Gauze and Kerlix/Conform Dressing Change Frequency: Wound #4 Right,Lateral Foot: Change dressing every other day. Edema Control: Wound #4 Right,Lateral Foot: Patient to wear own compression stockings - wear daily Off-Loading: Wound #4 Right,Lateral Foot: Open toe surgical shoe with peg assist. 1. I would recommend currently that we go ahead and continue with the wound care measures as before with regard to the silver alginate and packing I think this is good to be the best way to go. 2. I am also going to suggest that the patient needs to ensure that the wound bed is packed with the alginate if we have any space and is not in contact with the base of the wound this can actually cause it to not heal appropriately and even worsened. 3. I would also recommend he continue and complete his antibiotic therapy at this point. I do believe that is of utmost importance as well. We will see patient back for reevaluation in 1 week here in the clinic. If  anything worsens or changes patient will contact our office for additional recommendations. Electronic Signature(s) Signed: 07/03/2019 4:01:17 PM By: [name] PA-C Entered By: [name] on 07/03/2019 16:01:17 [name] ([id number]) -------------------------------------------------------------------------------- SuperBill Details Patient Name: [name] Date of Service: 07/03/2019 Medical Record Number: [id number] Patient Account Number: [id number] Date of Birth/Sex: 1965/01/21 (54 y.o. M) Treating RN: [name] Primary Care Provider: [name] Other Clinician: Referring Provider: [name] Treating Provider/Extender: Melburn Hake, Deniss Wormley Weeks in Treatment: 16 Diagnosis Coding ICD-10 Codes Code Description E11.621 Type 2 diabetes mellitus with foot ulcer L97.512 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d L03.115 Cellulitis of right lower limb L84 Corns and callosities I10 Essential (primary) hypertension Z89.422 Acquired absence of other left toe(s) Facility Procedures CPT4 Code: [id number]  Description: 11042 - DEB SUBQ TISSUE 20 SQ CM/< Modifier: Quantity: 1 CPT4 Code: Description: ICD-10 Diagnosis Description B28.413 Non-pressure chronic ulcer of other part of right foot with fat layer ex Modifier: posed Quantity: Physician Procedures CPT4 Code: 2440102 Description: 72536 - WC PHYS SUBQ TISS 20 SQ CM Modifier: Quantity: 1 CPT4 Code: Description: ICD-10 Diagnosis Description U44.034 Non-pressure chronic ulcer of other part of right foot with fat layer ex Modifier: posed Quantity: Electronic Signature(s) Signed: 07/03/2019 4:01:31 PM By: [name] PA-C Entered By: [name] on 07/03/2019 16:01:30

## 2019-07-03 NOTE — Progress Notes (Signed)
SAYVON, ARTERBERRY ([id number]) Visit Report for 07/03/2019 Arrival Information Details Patient Name: [name], [name]. Date of Service: 07/03/2019 8:15 AM Medical Record Number: [id number] Patient Account Number: [id number] Date of Birth/Sex: November 16, 1965 (54 y.o. M) Treating RN: [name] Primary Care Maddalena Linarez: [name] Other Clinician: Referring Jaaziah Schulke: [name] Treating Lorrayne Ismael/Extender: Melburn Hake, HOYT Weeks in Treatment: 16 Visit Information History Since Last Visit Added or deleted any medications: No Patient Arrived: Ambulatory Any new allergies or adverse reactions: No Arrival Time: 08:28 Had a fall or experienced change in No Accompanied By: self activities of daily living that may affect Transfer Assistance: None risk of falls: Patient Identification Verified: Yes Signs or symptoms of abuse/neglect since last visito No Secondary Verification Process Completed: Yes Hospitalized since last visit: No Patient Has Alerts: Yes Implantable device outside of the clinic excluding No Patient Alerts: DMII cellular tissue based products placed in the center since last visit: Has Dressing in Place as Prescribed: Yes Pain Present Now: No Electronic Signature(s) Signed: 07/03/2019 11:35:27 AM By: Lorine Bears RCP, RRT, CHT Entered By: Lorine Bears on 07/03/2019 08:29:07 [name] ([id number]) -------------------------------------------------------------------------------- Encounter Discharge Information Details Patient Name: [name] Date of Service: 07/03/2019 8:15 AM Medical Record Number: [id number] Patient Account Number: [id number] Date of Birth/Sex: [birth year] (54 y.o. M) Treating RN: [name] Primary Care Himani Corona: [name] Other Clinician: Referring Niang Mitcheltree: [name] Treating Lanell Dubie/Extender: Melburn Hake, HOYT Weeks in Treatment: 16 Encounter Discharge Information Items Post  Procedure Vitals Discharge Condition: Stable Temperature (F): 98.4 Ambulatory Status: Ambulatory Pulse (bpm): 76 Discharge Destination: Home Respiratory Rate (breaths/min): 16 Transportation: Private Auto Blood Pressure (mmHg): 158/72 Accompanied By: self Schedule Follow-up Appointment: Yes Clinical Summary of Care: Electronic Signature(s) Signed: 07/03/2019 3:55:13 PM By: [name] Entered By: [name] on 07/03/2019 08:53:18 [name] ([id number]) -------------------------------------------------------------------------------- Lower Extremity Assessment Details Patient Name: [name] Date of Service: 07/03/2019 8:15 AM Medical Record Number: [id number] Patient Account Number: [id number] Date of Birth/Sex: 1965/02/01 (54 y.o. M) Treating RN: [name] Primary Care Danuta Huseman: [name] Other Clinician: Referring Delmar Arriaga: [name] Treating Page Pucciarelli/Extender: STONE III, HOYT Weeks in Treatment: 16 Edema Assessment Assessed: [Left: No] [Right: No] Edema: [Left: N] [Right: o] Vascular Assessment Pulses: Dorsalis Pedis Palpable: [Right:Yes] Electronic Signature(s) Signed: 07/03/2019 3:55:13 PM By: [name] Entered By: [name] on 07/03/2019 08:49:19 [name] ([id number]) -------------------------------------------------------------------------------- Multi Wound Chart Details Patient Name: [name] Date of Service: 07/03/2019 8:15 AM Medical Record Number: [id number] Patient Account Number: [id number] Date of Birth/Sex: 1965/10/03 (54 y.o. M) Treating RN: [name] Primary Care Gracelynn Bircher: [name] Other Clinician: Referring Hagan Vanauken: [name] Treating Margean Korell/Extender: Melburn Hake, HOYT Weeks in Treatment: 16 Vital Signs Height(in): 74 Pulse(bpm): 76 Weight(lbs): 308 Blood Pressure(mmHg): 158/72 Body Mass Index(BMI): 40 Temperature(F): 98.4 Respiratory Rate(breaths/min):  18 Photos: [N/A:N/A] Wound Location: Right, Lateral Foot N/A N/A Wounding Event: Gradually Appeared N/A N/A Primary Etiology: Diabetic Wound/Ulcer of the Lower N/A N/A Extremity Comorbid History: Hypertension, Type II Diabetes, N/A N/A Neuropathy Date Acquired: 03/12/2019 N/A N/A Weeks of Treatment: 16 N/A N/A Wound Status: Open N/A N/A Pending Amputation on Yes N/A N/A Presentation: Measurements L x W x D (cm) 0.3x0.7x0.7 N/A N/A Area (cm) : 0.165 N/A N/A Volume (cm) : 0.115 N/A N/A % Reduction in Area: 74.10% N/A N/A % Reduction in Volume: 9.40% N/A N/A Classification: Grade 1 N/A N/A Exudate Amount: Medium N/A N/A Exudate Type: Purulent N/A N/A Exudate Color: yellow, brown, green N/A N/A  Wound Margin: Flat and Intact N/A N/A Granulation Amount: Medium (34-66%) N/A N/A Granulation Quality: Pink N/A N/A Necrotic Amount: Medium (34-66%) N/A N/A Exposed Structures: Fat Layer (Subcutaneous Tissue) N/A N/A Exposed: Yes Fascia: No Tendon: No Muscle: No Joint: No Bone: No Epithelialization: None N/A N/A Treatment Notes Electronic Signature(s) Signed: 07/03/2019 3:55:13 PM By: [name] Entered By: [name] on 07/03/2019 08:49:31 [name] ([name] ([id number]) -------------------------------------------------------------------------------- [place] Details Patient Name: JUQUAN, REZNICK. Date of Service: 07/03/2019 8:15 AM Medical Record Number: [id number] Patient Account Number: [id number] Date of Birth/Sex: [birth year] (54 y.o. M) Treating RN: [name] Primary Care Lourene Hoston: [name] Other Clinician: Referring Skarleth Delmonico: [name] Treating Siddhant Hashemi/Extender: Melburn Hake, HOYT Weeks in Treatment: 16 Active Inactive Nutrition Nursing Diagnoses: Impaired glucose control: actual or potential Goals: Patient/caregiver verbalizes understanding of need to maintain therapeutic glucose control per  primary care physician Date Initiated: 03/13/2019 Target Resolution Date: 04/11/2019 Goal Status: Active Interventions: Assess patient nutrition upon admission and as needed per policy Notes: Orientation to the Wound Care Program Nursing Diagnoses: Knowledge deficit related to the wound healing center program Goals: Patient/caregiver will verbalize understanding of the [place] Date Initiated: 03/13/2019 Target Resolution Date: 04/11/2019 Goal Status: Active Interventions: Provide education on orientation to the wound center Notes: Wound/Skin Impairment Nursing Diagnoses: Impaired tissue integrity Goals: Ulcer/skin breakdown will have a volume reduction of 30% by week 4 Date Initiated: 03/13/2019 Target Resolution Date: 04/11/2019 Goal Status: Active Interventions: Assess ulceration(s) every visit Notes: Electronic Signature(s) Signed: 07/03/2019 3:55:13 PM By: [name] Entered By: [name] on 07/03/2019 08:49:24 [name] ([id number]) -------------------------------------------------------------------------------- Pain Assessment Details Patient Name: [name] Date of Service: 07/03/2019 8:15 AM Medical Record Number: [id number] Patient Account Number: [id number] Date of Birth/Sex: 1965/09/16 (54 y.o. M) Treating RN: [name] Primary Care Makayia Duplessis: [name] Other Clinician: Referring Gibson Lad: [name] Treating Sira Adsit/Extender: Melburn Hake, HOYT Weeks in Treatment: 16 Active Problems Location of Pain Severity and Description of Pain Patient Has Paino No Site Locations Pain Management and Medication Current Pain Management: Electronic Signature(s) Signed: 07/03/2019 12:42:51 PM By: Sandre Kitty Signed: 07/03/2019 3:55:13 PM By: [name] Entered By: Sandre Kitty on 07/03/2019 08:33:40 [name]  ([id number]) -------------------------------------------------------------------------------- Patient/Caregiver Education Details Patient Name: JERICK, KHACHATRYAN. Date of Service: 07/03/2019 8:15 AM Medical Record Number: [id number] Patient Account Number: [id number] Date of Birth/Gender: [birth year] (54 y.o. M) Treating RN: [name] Primary Care Physician: [name] Other Clinician: Referring Physician: [name] Treating Physician/Extender: Sharalyn Ink in Treatment: 16 Education Assessment Education Provided To: Patient Education Topics Provided Wound/Skin Impairment: Handouts: Caring for Your Ulcer Methods: Demonstration, Explain/Verbal Responses: State content correctly Electronic Signature(s) Signed: 07/03/2019 3:55:13 PM By: [name] Entered By: [name] on 07/03/2019 08:52:36 [name] ([id number]) -------------------------------------------------------------------------------- Wound Assessment Details Patient Name: [name] Date of Service: 07/03/2019 8:15 AM Medical Record Number: [id number] Patient Account Number: [id number] Date of Birth/Sex: [birth year] (54 y.o. M) Treating RN: [name] Primary Care Laureen Frederic: [name] Other Clinician: Referring Briunna Leicht: [name] Treating Keiva Dina/Extender: STONE III, HOYT Weeks in Treatment: 16 Wound Status Wound Number: 4 Primary Etiology: Diabetic Wound/Ulcer of the Lower Extremity Wound Location: Right, Lateral Foot Wound Status: Open Wounding Event: Gradually Appeared Comorbid History: Hypertension, Type II Diabetes, Neuropathy Date Acquired: 03/12/2019 Weeks Of Treatment: 16 Clustered Wound: No Pending Amputation On Presentation Photos Wound Measurements Length: (cm) 0.3 % Redu Width: (cm) 0.7 % Redu Depth: (cm) 0.7  Epithe Area: (cm) 0.165 [place] Volume: (cm) 0.115 Under ction in Area: 74.1% ction in Volume: 9.4% lialization: None ling:  No mining: No Wound Description Classification: Grade 1 Foul O Wound Margin: Flat and Intact Slough Exudate Amount: Medium Exudate Type: Purulent Exudate Color: yellow, brown, green dor After Cleansing: No /Fibrino Yes Wound Bed Granulation Amount: Medium (34-66%) Exposed Structure Granulation Quality: Pink Fascia Exposed: No Necrotic Amount: Medium (34-66%) Fat Layer (Subcutaneous Tissue) Exposed: Yes Necrotic Quality: Adherent Slough Tendon Exposed: No Muscle Exposed: No Joint Exposed: No Bone Exposed: No Treatment Notes Wound #4 (Right, Lateral Foot) Notes scell, gauze, conform DISHAWN, BHARGAVA ([id number]) Electronic Signature(s) Signed: 07/03/2019 3:55:13 PM By: [name] Entered By: [name] on 07/03/2019 08:49:06 [name] ([id number]) -------------------------------------------------------------------------------- Vitals Details Patient Name: [name] Date of Service: 07/03/2019 8:15 AM Medical Record Number: [id number] Patient Account Number: [id number] Date of Birth/Sex: Jul 21, 1965 (54 y.o. M) Treating RN: [name] Primary Care Julieanne Hadsall: [name] Other Clinician: Referring Emaya Preston: [name] Treating Henryk Ursin/Extender: Melburn Hake, HOYT Weeks in Treatment: 16 Vital Signs Time Taken: 08:25 Temperature (F): 98.4 Height (in): 74 Pulse (bpm): 76 Weight (lbs): 308 Respiratory Rate (breaths/min): 18 Body Mass Index (BMI): 39.5 Blood Pressure (mmHg): 158/72 Reference Range: 80 - 120 mg / dl Electronic Signature(s) Signed: 07/03/2019 11:35:27 AM By: Lorine Bears RCP, RRT, CHT Entered By: Becky Sax, Amado Nash on 07/03/2019 08:30:24

## 2019-07-10 ENCOUNTER — Other Ambulatory Visit: Payer: Self-pay

## 2019-07-10 ENCOUNTER — Encounter: Payer: Managed Care, Other (non HMO) | Admitting: Physician Assistant

## 2019-07-10 DIAGNOSIS — E11621 Type 2 diabetes mellitus with foot ulcer: Secondary | ICD-10-CM | POA: Diagnosis not present

## 2019-07-10 NOTE — Progress Notes (Addendum)
[name], [name] ([id number]) Visit Report for 07/10/2019 Chief Complaint Document Details Patient Name: [name], [name]. Date of Service: 07/10/2019 8:30 AM Medical Record Number: [id number] Patient Account Number: [id number] Date of Birth/Sex: 04-17-1965 (54 y.o. M) Treating RN: [name] Primary Care Provider: [name] Other Clinician: Referring Provider: [name] Treating Provider/Extender: [name], Patrisha Hausmann Weeks in Treatment: 17 Information Obtained from: Patient Chief Complaint Right lateral foot ulcer Electronic Signature(s) Signed: 07/10/2019 8:35:44 AM By: [name] PA-C Entered By: [name] on 07/10/2019 08:35:44 [name] ([id number]) -------------------------------------------------------------------------------- HPI Details Patient Name: [name] Date of Service: 07/10/2019 8:30 AM Medical Record Number: [id number] Patient Account Number: [id number] Date of Birth/Sex: 07-28-1965 (55 y.o. M) Treating RN: [name] Primary Care Provider: [name] Other Clinician: Referring Provider: [name] Treating Provider/Extender: [name], [name] Weeks in Treatment: 17 History of Present Illness HPI Description: This 54 year old male comes with an ulcerated area on the plantar aspect of the right foot which she's had for approximately a month. I have known him from a previous visit at [facility] wound center and was treated in the months of April and May 2016 and rapidly healed a left plantar ulcer with a total contact cast. He has been a diabetic for about 16 years and tries to keep active and is fairly compliant with his diabetes management. 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 Past medical history significant for hypertension, hyperlipidemia, and status post appendectomy 1993. He does not smoke or drink alcohol. 01/14/2015 -- the patient had tolerated his total contact cast very well and had no  problems and has had no systemic symptoms. However when his total contact cast was cut open he had excessive amount of purulent drainage in spite of being on antibiotics. He had had a recent x-ray done in the ER 12/22/2014 which showed IMPRESSION:No evidence of osseous erosion. Known soft tissue ulceration is not well characterized on radiograph. Scattered vascular calcifications seen. his last hemoglobin A1c in December was 7.3. He has been on Augmentin and doxycycline for the last 2 weeks. 01/21/2015 -- his culture grew rare growth of Pantoea species an MR moderate growth of Candida parapsilosis. it is sensitive to levofloxacin. He has not heard back from the insurance company regarding his hyperbaric oxygen therapy. His MRI has not been done yet and we will try and get him an earlier date 01/28/2015 -- MRI was done last night -- IMPRESSION:1. Soft tissue ulcer overlying the plantar aspect of the fifth metatarsal head extending to the cortex. Subcortical marrow edema in the fifth metatarsal head with corresponding T1 hypointensity is concerning for early osteomyelitis of the plantar lateral aspect of the fifth metatarsal head. Chest x-ray done on 01/14/2015 shows bronchiectatic changes without infiltrate. EKG done on generally 17 2017 shows a normal sinus rhythm and is a normal EKG. 02/04/2015 -- he was asked to see Dr. [name] last week and had 2 appointments but had to cancel both due to pressures of work. Last night he has woken up with severe pain in the foot and leg and it is swollen up. No fever or no change in his blood glucose. Addendum: I spoke with Dr. [name] who kindly agreed to accept the patient for inpatient therapy and have also opened to the hospitalist Dr. [name], and discuss details of the management including PICC line and repeat cultures. 02/12/2015-- -- was seen by Dr. [name] in the hospital and a PICC line was placed. He was to receive Ceftazidime 2  g every 12  hourly, oral levofloxacin 750 mg every 24 hourly and oral fluconazole 200 mg daily. The antibiotics were to be given for 4 weeks except the Diflucan was to be given for the first 2 weeks. Reviewed note from 02/10/2015 -- and Dr. [name] had recommended management for growth of MSSA and Serratia. He switched him from ceftazidime to ceftriaxone 2 g every 24 hours. Levofloxacin was stopped and he would continue on fluconazole for another week. He had asked me to decide whether further imaging was necessary and whether surgical debridement of the infected bone was needed. He is doing well and has been off work for this week and we will keep him off the next week. 02/22/2015 -- he was seen by my colleague on 01/19/2015 and at that time an incision and drainage was done on his right lateral forefoot on the dorsum. Today when I probed this wound it is frankly draining pus and it communicates with the ulcer on the plantar aspect of his right foot. The patient is still on IV ceftriaxone 2 g every 24 hours and is to be seen by Dr. [name] on Friday. 03/19/2015 -- On 03/04/2015 I spoke to Dr. 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 He recommended operative debridement which would probably result in the fifth metatarsal head and toe amputation.The patient would be referred back to [place] once he was done with surgery. He was admitted to [facility] yesterday and had surgery done by podiatry for a right fifth metatarsal acute osteomyelitis with cellulitis and abscess. He had a right foot incision and drainage with fifth metatarsal partial amputation and removal of toe infected bone and soft tissue with cultures. The wound was partially closed and packing of the distal end was done. Patient was already on cefepime 2 g IV every 8 hourly and put  on vancomycin pending final cultures. He had grown moderate gram-negative rods, and later found to be rare diphtheroids. I received a call from Dr. Cannon Kettle the podiatrist and we discussed the above. On 03/10/2015 he was found positive for influenza a and has been put on Tamiflu. Since his discharge he has been seen by Dr. Cannon Kettle who is planning to remove his sutures this coming week. He was reviewed by Dr. [name] on 03/17/2015 and his Diflucan was stopped and vancomycin. After the 3 doses he is taking. He is going to change Ceftazidime to Zosyn 3.375 g IV every 8 hours. 03/25/2015 -- he was seen by the podiatrist a couple of days ago and the sutures were removed. She will follow back with him in 4 weeks' time and at that time x-rays will be taken and a custom molded insert would be made for his shoe. 04/01/2015 -- he was seen by Dr. [name] on 03/29/2015 who pulled the PICC line stopped his IV antibiotics and recommended starting doxycycline and levofloxacin for 2 weeks. He also stop the fluconazole. The patient will follow up with him only when necessary. Readmission: 03/15/17 on evaluation today patient presents for initial evaluation concerning the new issues although he has previously been evaluated in our clinic. Unfortunately the previous evaluation led to the patient having to proceed to amputation and so he is somewhat nervous about being here [name], [name] ([id number]) today. With that being said he has a very  slight blister that occurred on the plantar aspect more medial on the right great toe that has been present for just a very short amount of time, several days. With that being said he felt like initially when he called that he somewhat overreacted but due to the fact that his previous issue led to amputation he is very cautious these days I explained that he did the right thing. With that being said he has been tolerating the dressing changes without complication mainly  he just been covering this. He does not have any discomfort in secondary to neuropathy it's unlikely that he feels much. With that being said he does tell me that the issue that he had here is that he went barefoot when he knows he should not have which subsequently led to the blisters. He states is definitely not doing that anymore. His most recent hemoglobin A1c was December and registered 6.9 his ABI today was 1.1. 03/27/17 on evaluation today patient appears to be doing great in regard to his right great toe ulcer. He has been tolerating the dressing changes without complication. The good news is this is making excellent progress and he seems to be caring for this in an excellent fashion. I see no evidence of breakdown that would make me concerned that he was at risk for infection/amputation. This has obviously been his concern due to the fifth toe amputation that was necessitated previous when he had a similar issue. Nonetheless this seems to be progressing much more nicely. Readmission: 03/13/2019 upon evaluation today patient presents for reevaluation here in the clinic concerning issues he has been having with his right foot on the lateral portion at the proximal end of the metatarsal. Subsequently he tells me that this just opened up in the past couple of days or so and the erythema really began about 24 to 48 hours ago. Fortunately there is no signs of systemic infection. 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 That left him with this bony prominence that is where the wound is currently. There is erythema surrounding that does have me concerned about cellulitis. With that being said he was noncompressible as far as ABIs are concerned I do think we can need to check into arterial studies at this point. Patient does have a history of hypertension along with diabetes mellitus type 2. He also tends to develop quite a bit of callus white  often. 03/20/2019 upon evaluation today patient appears to be doing very well in regard to his wounds currently. He has been tolerating the dressing changes without complication. Fortunately there is no signs of infection. His ABIs were good at this point and registered at 1.07 on the left and 1.09 on the right. In regard to the x-ray this was negative as well for any signs of osteomyelitis. The patient also seems to be doing better in regard to the infection. He still has several days of the antibiotic left at this point best the Bactrim DS and he seems to be doing very well with this. Overall I am extremely pleased with the progress in 1 week's time he is going require some sharp debridement today however. 03/27/2019 upon evaluation today patient actually appears to be making some progress in regard to his wound. It is a little deeper but measuring smaller which is good news due to the fact that again were clear away some of the necrotic tissue which is why the depth is increasing  a little bit. Nonetheless after like were getting very close to being down at a good wound bed. Fortunately there is no signs of active infection at this time. There is a little bit of erythema immediately surrounding the wound that we do want to be very cognizant of and careful about. For that reason I am going to go ahead and extend his antibiotic today for an additional 10 days that is the Bactrim. 04/03/2019 upon evaluation today patient appears to be making some progress. I do feel like the Annitta Needs is doing a good job along with the alginate is being packed into the wound space behind. He has been tolerating the dressing changes without complication. Fortunately there is no signs of active infection at this time. No fevers, chills, nausea, vomiting, or diarrhea. 04/10/2019 upon evaluation today patient appears to be making progress. The wound is not quite as deep but he still does not have a great wound surface yet for  working on this and the Santyl does seem to be cleaning things up. Fortunately there is no evidence of active infection at this time. No fevers, chills, nausea, vomiting, or diarrhea. 04/17/2019 upon evaluation today patient appears to be doing excellent at this time in regard to his foot ulcer. I do believe the Santyl with the alginate packed in behind has been of benefit for him and I am very pleased in this regard. With that being said I am feeling like the wound surface is improving quite a bit each time I see him still I do not believe we are at the point of stating that the wound bed is perfect but again were making progress. The patient is going require some sharp debridement today. 4/22; this is a patient who is using Santyl with calcium alginate. Apparently his wound dimensions have been improving. Patient is changing the dressing himself. He has a surgical shoe. He works in a sitting position therefore is not on his feet all that much. There is undermining 05/01/2019 upon evaluation today patient appears to be doing a little better in regard to the size of his wound though he still has some depth. This does seem to be much cleaner has been using Santyl on the base of the wound followed by packing with silver alginate and behind. Nonetheless I do believe that based on what we are seeing we may need to switch to a collagen type dressing endoform may actually be excellent for him. 05/08/2019 upon evaluation today patient's wound actually appears to be showing better granulation tissue in the base of the wound. With that being said he does have some epiboly noted around the edges of the wound especially plantar and more distal. Subsequently this is can require sharp debridement today. Fortunately there is no signs of active systemic infection though I do feel like there is some local cellulitis noted at this point. The patient also notes has had increased drainage. 5/13; wound volume quite a bit  improved this week down to 3 mm. Patient states pain and drainage better. Culture from last week grew staph aureus [not MRSA] and this is sensitive to quinolones and he is on Levaquin. HOWEVER he also grew abundant Enterococcus faecalis treatment of choice for this is ampicillin. Quinolone coverage is unreliable 5/20 completing the Augmentin. Small wound on the right lateral foot. Thick rolled edges around the wound. Using silver alginate 05/29/2019 upon evaluation today patient appears to be doing better compared to last time I saw him. Fortunately there is no  signs of active infection and I do feel like he is actually making good progress. Overall the quality of the wound bed along with the size looks improved and the inflammation/erythema that was previously noted by myself when I saw the couple weeks back actually has also resolved and this is great. Overall very pleased with how things stand. 06/06/2019 upon evaluation today patient actually appears to be doing quite well with regard to his foot ulcer with regard to some of the granulation were seen at this point. There does not appear to be any evidence of systemic or local infection although he still has discomfort I feel like things are moving in a slow but surely correct direction. I think we may need to help fill some of the space however even though he is using the collagen we may need to have something behind this such as a plain packing strip to try to help hold the collagen in the base of the wound. 06/12/2019 upon evaluation today patient appears to be doing well with regard to his foot ulcer. This seems to be making some slow but steady progress. The wound does not appear to be as deep today compared to where it was previous. Fortunately there is no signs of active infection at this time. No fevers, chills, nausea, vomiting, or diarrhea. 06/19/2019 on evaluation today patient's wound does not appear to be doing quite as well as what I would  like to see. Fortunately there is no signs of systemic infection at this time which is great news. With that being said I do believe that the patient needs to have a wound culture as I do [name], [name]. ([id number]) believe he likely has a local infection that needs to be addressed. He is also can require some debridement today as well. 06/26/2019 upon evaluation today patient's wound actually appears to be showing signs of excellent improvement today. There does not appear to be any evidence of active infection which is great news. There does not appear to be significant erythema at this point. The patient does have improvement overall in the appearance of the infection he has been taking the Augmentin the culture came back showing no organism was grown at this point but nonetheless I do believe he has been doing well with the antibiotics I would continue that at this point. 07/03/2019 upon evaluation today patient appears to be doing about the same in regard to his wound. There does not appear to be any evidence of active infection and overall I am pleased with where things stand. The patient is tolerating the dressing changes without complication but it does not appear that they are packing the wound at this time which I think is going to slow down his healing prospects. 07/10/2019 upon evaluation today patient appears to be doing little better in my opinion in regard to the overall moisture collected in the base of the wound. They have been packing this with a little bit of silver alginate and that has done quite well. Overall I am pleased in that regard. Subsequently the redness does not appear to be any worse today I did actually go ahead and marked this since this can be 2 weeks between now and when I see him again I would make sure that if anything changes or worsens he will be able to notice this and go ahead and initiate treatment with the Augmentin. Electronic Signature(s) Signed:  07/10/2019 8:56:13 AM By: [name] PA-C Entered By: [name],  [name] on 07/10/2019 08:56:12 [name], [name] ([id number]) -------------------------------------------------------------------------------- Physical Exam Details Patient Name: [name], [name]. Date of Service: 07/10/2019 8:30 AM Medical Record Number: [id number] Patient Account Number: [id number] Date of Birth/Sex: [birth year] (55 y.o. M) Treating RN: [name] Primary Care Provider: [name] Other Clinician: Referring Provider: [name] Treating Provider/Extender: [name], [name] Weeks in Treatment: 33 Constitutional Well-nourished and well-hydrated in no acute distress. Respiratory normal breathing without difficulty. Psychiatric this patient is able to make decisions and demonstrates good insight into disease process. Alert and Oriented x 3. pleasant and cooperative. Notes Upon inspection patient's wound bed actually was measuring larger but mainly because of moved over the flap of skin last week and therefore that did make the overall measurement today bigger. Nonetheless the wound does not appear to be worse and in fact actually appears to be somewhat better there is still erythema around the outer edge but I do not see any obvious temperature differences between that area and the skin above. Nonetheless I did mark this with a sharpie in order to give the patient something objective to look out as far as if the redness spreads outside of this area and that he should go ahead and start the antibiotic. Electronic Signature(s) Signed: 07/10/2019 8:56:50 AM By: [name] PA-C Entered By: [name] on 07/10/2019 08:56:50 [name] ([id number]) -------------------------------------------------------------------------------- Physician Orders Details Patient Name: [name] Date of Service: 07/10/2019 8:30 AM Medical Record Number: [id number] Patient Account Number:  [id number] Date of Birth/Sex: [birth year] (54 y.o. 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 - in office o Dial antibacterial soap, wash wounds, rinse and pat dry prior to dressing wounds Primary Wound Dressing Wound #4 Right,Lateral Foot o Silver Alginate Secondary Dressing Wound #4 Right,Lateral Foot o Gauze and Kerlix/Conform Dressing Change Frequency Wound #4 Right,Lateral Foot o Change dressing every other day. Follow-up Appointments Wound #4 Right,Lateral Foot o Return Appointment in 2 weeks. Edema Control Wound #4 Right,Lateral Foot o Patient to wear own compression stockings - wear daily Off-Loading Wound #4 Right,Lateral Foot o Open toe surgical shoe with peg assist. Patient Medications Allergies: No Known Allergies Notifications Medication Indication Start End Augmentin 07/10/2019 DOSE 1 - oral 875 mg-125 mg tablet - 1 tablet oral taken 2 times per day for 14 days [name], [name] ([id number]) Electronic Signature(s) Signed: 07/10/2019 8:58:14 AM By: [name] PA-C Entered By: [name] on 07/10/2019 08:58:13 [name] ([id number]) -------------------------------------------------------------------------------- Problem List Details Patient Name: [name] Date of Service: 07/10/2019 8:30 AM Medical Record Number: [id number] Patient  Account Number: [id number] Date of Birth/Sex: 02-Apr-1965 (54 y.o. 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 M) Treating RN: [name] Primary Care Provider: [name] Other Clinician: Referring Provider: [name] Treating Provider/Extender: [name], Lanier Felty Weeks in Treatment: 17 Subjective Chief Complaint Information obtained from Patient Right lateral foot ulcer History of Present Illness (HPI) This 54 year old male comes with an ulcerated area on the plantar aspect of the right foot which she's had for approximately a month. I have known him from a previous visit at [facility] wound center and was treated in the months of April and May 2016 and rapidly healed a left plantar ulcer with a total contact cast. He has been a diabetic for about 16 years and tries to keep active and is fairly  compliant with his diabetes management. 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 Past medical history significant for hypertension, hyperlipidemia, and status post appendectomy 1993. He does not smoke or drink alcohol. 01/14/2015 -- the patient had tolerated his total contact cast very well and had no problems and has had no systemic symptoms. However when his total contact cast was cut open he had excessive amount of purulent drainage in spite of being on antibiotics. He had had a recent x-ray done in the ER 12/22/2014 which showed IMPRESSION:No evidence of osseous erosion. Known soft tissue ulceration is not well characterized on radiograph. Scattered vascular calcifications seen. his last hemoglobin A1c in December was 7.3. He has been on Augmentin and doxycycline for the last 2 weeks. 01/21/2015 -- his culture grew rare growth of Pantoea species an MR moderate growth of Candida parapsilosis. it is sensitive to levofloxacin. He has not heard back from the insurance company regarding his hyperbaric oxygen therapy. His MRI has not been done yet and we will try and get him an earlier date 01/28/2015 -- MRI was done last night -- IMPRESSION:1. Soft tissue ulcer overlying the plantar aspect of the fifth metatarsal head extending to the cortex. Subcortical marrow edema in the fifth metatarsal head with corresponding T1 hypointensity is concerning for early osteomyelitis of the plantar lateral aspect of the fifth metatarsal head. Chest x-ray done on 01/14/2015 shows bronchiectatic changes without infiltrate. EKG done on generally 17 2017 shows a normal sinus rhythm and is a normal EKG. 02/04/2015 -- he was asked to see Dr. [name] last week and had 2 appointments but had to cancel both due to pressures of work. Last night he has woken up with severe pain in the foot and leg and it is swollen up. No fever or no change in his blood glucose. Addendum: I spoke with Dr. [name] who kindly agreed to  accept the patient for inpatient therapy and have also opened to the hospitalist Dr. [name], and discuss details of the management including PICC line and repeat cultures. 02/12/2015-- -- was seen by Dr. [name] in the hospital and a PICC line was placed. He was to receive Ceftazidime 2 g every 12 hourly, oral levofloxacin 750 mg every 24 hourly and oral fluconazole 200 mg daily. The antibiotics were to be given  for 4 weeks except the Diflucan was to be given for the first 2 weeks. Reviewed note from 02/10/2015 -- and Dr. [name] had recommended management for growth of MSSA and Serratia. He switched him from ceftazidime to ceftriaxone 2 g every 24 hours. Levofloxacin was stopped and he would continue on fluconazole for another week. He had asked me to decide whether further imaging was necessary and whether surgical debridement of the infected bone was needed. He is doing well and has been off work for this week and we will keep him off the next week. 02/22/2015 -- he was seen by my colleague on 01/19/2015 and at that time an incision and drainage was done on his right lateral forefoot on the dorsum. Today when I probed this wound it is frankly draining pus and it communicates with the ulcer on the plantar aspect of his right foot. The patient is still on IV ceftriaxone 2 g every 24 hours and is to be seen by Dr. [name] on Friday. 03/19/2015 -- On 03/04/2015 I spoke to Dr. 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 He recommended operative debridement which would probably result in the fifth metatarsal head and toe amputation.The patient would be referred back to [place] once he was done with surgery. He was admitted to [facility] yesterday and had surgery done by podiatry for a right fifth metatarsal acute osteomyelitis with  cellulitis and abscess. He had a right foot incision and drainage with fifth metatarsal partial amputation and removal of toe infected bone and soft tissue with cultures. The wound was partially closed and packing of the distal end was done. Patient was already on cefepime 2 g IV every 8 hourly and put on vancomycin pending final cultures. He had grown moderate gram-negative rods, and later found to be rare diphtheroids. I received a call from Dr. Cannon Kettle the podiatrist and we discussed the above. On 03/10/2015 he was found positive for influenza a and has been put on Tamiflu. Since his discharge he has been seen by Dr. Cannon Kettle who is planning to remove his sutures this coming week. He was reviewed by Dr. [name] on 03/17/2015 and his Diflucan was stopped and vancomycin. After the 3 doses he is taking. He is going to change Ceftazidime to Zosyn 3.375 g IV every 8 hours. 03/25/2015 -- he was seen by the podiatrist a couple of days ago and the sutures were removed. She will follow back with him in 4 weeks' time and at that time x-rays will be taken and a custom molded insert would be made for his shoe. 04/01/2015 -- he was seen by Dr. [name] on 03/29/2015 who pulled the PICC line stopped his IV antibiotics and recommended starting doxycycline and levofloxacin for 2 weeks. He also stop the fluconazole. The patient will follow up with him only when necessary. [name], [name] ([id number]) Readmission: 03/15/17 on evaluation today patient presents for initial evaluation concerning the new issues although he has previously been evaluated in our clinic. Unfortunately the previous evaluation led to the patient having to proceed to amputation and so he is somewhat nervous about being here today. With that being said he has a very slight blister that occurred on the plantar aspect more medial on the right great toe that has been present for just a very short  amount of time, several days. With that  being said he felt like initially when he called that he somewhat overreacted but due to the fact that his previous issue led to amputation he is very cautious these days I explained that he did the right thing. With that being said he has been tolerating the dressing changes without complication mainly he just been covering this. He does not have any discomfort in secondary to neuropathy it's unlikely that he feels much. With that being said he does tell me that the issue that he had here is that he went barefoot when he knows he should not have which subsequently led to the blisters. He states is definitely not doing that anymore. His most recent hemoglobin A1c was December and registered 6.9 his ABI today was 1.1. 03/27/17 on evaluation today patient appears to be doing great in regard to his right great toe ulcer. He has been tolerating the dressing changes without complication. The good news is this is making excellent progress and he seems to be caring for this in an excellent fashion. I see no evidence of breakdown that would make me concerned that he was at risk for infection/amputation. This has obviously been his concern due to the fifth toe amputation that was necessitated previous when he had a similar issue. Nonetheless this seems to be progressing much more nicely. Readmission: 03/13/2019 upon evaluation today patient presents for reevaluation here in the clinic concerning issues he has been having with his right foot on the lateral portion at the proximal end of the metatarsal. Subsequently he tells me that this just opened up in the past couple of days or so and the erythema really began about 24 to 48 hours ago. Fortunately there is no signs of systemic infection. 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 That left him with this bony prominence that is where the wound is currently. There is erythema surrounding that does have me concerned  about cellulitis. With that being said he was noncompressible as far as ABIs are concerned I do think we can need to check into arterial studies at this point. Patient does have a history of hypertension along with diabetes mellitus type 2. He also tends to develop quite a bit of callus white often. 03/20/2019 upon evaluation today patient appears to be doing very well in regard to his wounds currently. He has been tolerating the dressing changes without complication. Fortunately there is no signs of infection. His ABIs were good at this point and registered at 1.07 on the left and 1.09 on the right. In regard to the x-ray this was negative as well for any signs of osteomyelitis. The patient also seems to be doing better in regard to the infection. He still has several days of the antibiotic left at this point best the Bactrim DS and he seems to be doing very well with this. Overall I am extremely pleased with the progress in 1 week's time he is going require some sharp debridement today however. 03/27/2019 upon evaluation today patient actually appears to be making some progress in regard to his wound. It is a little deeper but measuring smaller which is good news due to the fact that again were clear away some of the necrotic tissue which is why the depth is increasing a little bit. Nonetheless after like were getting very close to being down at a good wound bed. Fortunately there is no signs of  active infection at this time. There is a little bit of erythema immediately surrounding the wound that we do want to be very cognizant of and careful about. For that reason I am going to go ahead and extend his antibiotic today for an additional 10 days that is the Bactrim. 04/03/2019 upon evaluation today patient appears to be making some progress. I do feel like the Annitta Needs is doing a good job along with the alginate is being packed into the wound space behind. He has been tolerating the dressing changes  without complication. Fortunately there is no signs of active infection at this time. No fevers, chills, nausea, vomiting, or diarrhea. 04/10/2019 upon evaluation today patient appears to be making progress. The wound is not quite as deep but he still does not have a great wound surface yet for working on this and the Santyl does seem to be cleaning things up. Fortunately there is no evidence of active infection at this time. No fevers, chills, nausea, vomiting, or diarrhea. 04/17/2019 upon evaluation today patient appears to be doing excellent at this time in regard to his foot ulcer. I do believe the Santyl with the alginate packed in behind has been of benefit for him and I am very pleased in this regard. With that being said I am feeling like the wound surface is improving quite a bit each time I see him still I do not believe we are at the point of stating that the wound bed is perfect but again were making progress. The patient is going require some sharp debridement today. 4/22; this is a patient who is using Santyl with calcium alginate. Apparently his wound dimensions have been improving. Patient is changing the dressing himself. He has a surgical shoe. He works in a sitting position therefore is not on his feet all that much. There is undermining 05/01/2019 upon evaluation today patient appears to be doing a little better in regard to the size of his wound though he still has some depth. This does seem to be much cleaner has been using Santyl on the base of the wound followed by packing with silver alginate and behind. Nonetheless I do believe that based on what we are seeing we may need to switch to a collagen type dressing endoform may actually be excellent for him. 05/08/2019 upon evaluation today patient's wound actually appears to be showing better granulation tissue in the base of the wound. With that being said he does have some epiboly noted around the edges of the wound especially plantar  and more distal. Subsequently this is can require sharp debridement today. Fortunately there is no signs of active systemic infection though I do feel like there is some local cellulitis noted at this point. The patient also notes has had increased drainage. 5/13; wound volume quite a bit improved this week down to 3 mm. Patient states pain and drainage better. Culture from last week grew staph aureus [not MRSA] and this is sensitive to quinolones and he is on Levaquin. HOWEVER he also grew abundant Enterococcus faecalis treatment of choice for this is ampicillin. Quinolone coverage is unreliable 5/20 completing the Augmentin. Small wound on the right lateral foot. Thick rolled edges around the wound. Using silver alginate 05/29/2019 upon evaluation today patient appears to be doing better compared to last time I saw him. Fortunately there is no signs of active infection and I do feel like he is actually making good progress. Overall the quality of the wound bed along with  the size looks improved and the inflammation/erythema that was previously noted by myself when I saw the couple weeks back actually has also resolved and this is great. Overall very pleased with how things stand. 06/06/2019 upon evaluation today patient actually appears to be doing quite well with regard to his foot ulcer with regard to some of the granulation were seen at this point. There does not appear to be any evidence of systemic or local infection although he still has discomfort I feel like things are moving in a slow but surely correct direction. I think we may need to help fill some of the space however even though he is using the collagen we may need to have something behind this such as a plain packing strip to try to help hold the collagen in the base of the wound. 06/12/2019 upon evaluation today patient appears to be doing well with regard to his foot ulcer. This seems to be making some slow but steady progress. The  wound does not appear to be as deep today compared to where it was previous. Fortunately there is no signs of active infection at [name], [name]. ([id number]) this time. No fevers, chills, nausea, vomiting, or diarrhea. 06/19/2019 on evaluation today patient's wound does not appear to be doing quite as well as what I would like to see. Fortunately there is no signs of systemic infection at this time which is great news. With that being said I do believe that the patient needs to have a wound culture as I do believe he likely has a local infection that needs to be addressed. He is also can require some debridement today as well. 06/26/2019 upon evaluation today patient's wound actually appears to be showing signs of excellent improvement today. There does not appear to be any evidence of active infection which is great news. There does not appear to be significant erythema at this point. The patient does have improvement overall in the appearance of the infection he has been taking the Augmentin the culture came back showing no organism was grown at this point but nonetheless I do believe he has been doing well with the antibiotics I would continue that at this point. 07/03/2019 upon evaluation today patient appears to be doing about the same in regard to his wound. There does not appear to be any evidence of active infection and overall I am pleased with where things stand. The patient is tolerating the dressing changes without complication but it does not appear that they are packing the wound at this time which I think is going to slow down his healing prospects. 07/10/2019 upon evaluation today patient appears to be doing little better in my opinion in regard to the overall moisture collected in the base of the wound. They have been packing this with a little bit of silver alginate and that has done quite well. Overall I am pleased in that regard. Subsequently the redness does not appear to be any  worse today I did actually go ahead and marked this since this can be 2 weeks between now and when I see him again I would make sure that if anything changes or worsens he will be able to notice this and go ahead and initiate treatment with the Augmentin. Objective Constitutional Well-nourished and well-hydrated in no acute distress. Vitals Time Taken: 8:30 AM, Height: 74 in, Weight: 308 lbs, BMI: 39.5, Temperature: 98.3 F, Pulse: 73 bpm, Respiratory Rate: 18 breaths/min, Blood Pressure: 155/70 mmHg. Respiratory normal breathing  without difficulty. Psychiatric this patient is able to make decisions and demonstrates good insight into disease process. Alert and Oriented x 3. pleasant and cooperative. General Notes: Upon inspection patient's wound bed actually was measuring larger but mainly because of moved over the flap of skin last week and therefore that did make the overall measurement today bigger. Nonetheless the wound does not appear to be worse and in fact actually appears to be somewhat better there is still erythema around the outer edge but I do not see any obvious temperature differences between that area and the skin above. Nonetheless I did mark this with a sharpie in order to give the patient something objective to look out as far as if the redness spreads outside of this area and that he should go ahead and start the antibiotic. Integumentary (Hair, Skin) Wound #4 status is Open. Original cause of wound was Gradually Appeared. The wound is located on the Right,Lateral Foot. The wound measures 0.6cm length x 0.6cm width x 0.7cm depth; 0.283cm^2 area and 0.198cm^3 volume. There is Fat Layer (Subcutaneous Tissue) Exposed exposed. There is no tunneling noted, however, there is undermining starting at 6:00 and ending at 1:00 with a maximum distance of 0.4cm. There is a medium amount of serous drainage noted. The wound margin is flat and intact. There is medium (34-66%) pink  granulation within the wound bed. There is a medium (34-66%) amount of necrotic tissue within the wound bed including Adherent Slough. Assessment Active Problems ICD-10 Type 2 diabetes mellitus with foot ulcer 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d Cellulitis of right lower limb Corns and callosities Essential (primary) hypertension Acquired absence of other left toe(s) [name], [name] ([id number]) Plan Wound Cleansing: Wound #4 Right,Lateral Foot: Clean wound with Normal Saline. - in office Dial antibacterial soap, wash wounds, rinse and pat dry prior to dressing wounds Primary Wound Dressing: Wound #4 Right,Lateral Foot: Silver Alginate Secondary Dressing: Wound #4 Right,Lateral Foot: Gauze and Kerlix/Conform Dressing Change Frequency: Wound #4 Right,Lateral Foot: Change dressing every other day. Follow-up Appointments: Wound #4 Right,Lateral Foot: Return Appointment in 2 weeks. Edema Control: Wound #4 Right,Lateral Foot: Patient to wear own compression stockings - wear daily Off-Loading: Wound #4 Right,Lateral Foot: Open toe surgical shoe with peg assist. The following medication(s) was prescribed: Augmentin oral 875 mg-125 mg tablet 1 1 tablet oral taken 2 times per day for 14 days starting 07/10/2019 1. I would recommend that we continue with the silver alginate dressing I think that still best. 2. I am also can recommend for the patient that we go ahead and continue with appropriate offloading using the offloading shoe. 3. I would also suggest that we go ahead and give him a prescription for Augmentin he is not can take it yet but if he notices any of the erythema spreading outside of the marked area on his foot he will initiate the Augmentin at that point. We will see patient back for reevaluation in 2 weeks here in the clinic. If anything worsens or changes patient will contact our office for  additional recommendations. Electronic Signature(s) Signed: 07/10/2019 8:58:24 AM By: [name] PA-C Entered By: [name] on 07/10/2019 08:58:24 [name] ([id number]) -------------------------------------------------------------------------------- SuperBill Details Patient Name: [name] Date of Service: 07/10/2019 Medical Record Number: [id number] Patient Account Number: [id number] Date of Birth/Sex: 1965-09-03 (54 y.o. M) Treating RN: [name] Primary Care Provider: [name] Other Clinician: Referring Provider: [name] Treating Provider/Extender:  [name], Sarahanne Novakowski Weeks in Treatment: 17 Diagnosis Coding ICD-10 Codes Code Description E11.621 Type 2 diabetes mellitus with foot ulcer L97.512 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d L03.115 Cellulitis of right lower limb L84 Corns and callosities I10 Essential (primary) hypertension Z89.422 Acquired absence of other left toe(s) Facility Procedures CPT4 Code: [id number] Description: 99213 - WOUND CARE VISIT-LEV 3 EST PT Modifier: Quantity: 1 Physician Procedures CPT4 Code: [id number] Description: 68127 - WC PHYS LEVEL 4 - EST PT Modifier: Quantity: 1 CPT4 Code: Description: ICD-10 Diagnosis Description E11.621 Type 2 diabetes mellitus with foot ulcer L97.512 Non-pressure chronic ulcer of other part of right foot with fat layer e L03.115 Cellulitis of right lower limb L84 Corns and callosities Modifier: xposed Quantity: Electronic Signature(s) Signed: 07/10/2019 8:58:43 AM By: [name] PA-C Entered By: [name] on 07/10/2019 08:58:42

## 2019-07-11 NOTE — Progress Notes (Signed)
[name] ([id number]) Visit Report for 07/10/2019 Arrival Information Details Patient Name: [name], [name]. Date of Service: 07/10/2019 8:30 AM Medical Record Number: [id number] Patient Account Number: [id number] Date of Birth/Sex: 05-25-1965 (54 y.o. M) Treating RN: Cornell Barman Primary Care [place] Noori: [name] Other Clinician: Referring Kameren Baade: [name] Treating Kaidance Pantoja/Extender: Melburn Hake, HOYT Weeks in Treatment: 9 Visit Information History Since Last Visit Added or deleted any medications: No Patient Arrived: Ambulatory Has Dressing in Place as Prescribed: Yes Arrival Time: 08:30 Has Footwear/Offloading in Place as Prescribed: Yes Accompanied By: self Right: Surgical Shoe with Pressure Relief Transfer Assistance: None Insole Patient Identification Verified: Yes Pain Present Now: No Secondary Verification Process Completed: Yes Patient Has Alerts: Yes Patient Alerts: DMII Electronic Signature(s) Signed: 07/10/2019 5:43:28 PM By: Gretta Cool, BSN, RN, CWS, Kim RN, BSN Entered By: Gretta Cool, BSN, RN, CWS, Kim on 07/10/2019 08:30:37 [name] ([id number]) -------------------------------------------------------------------------------- Clinic Level of Care Assessment Details Patient Name: [name]. Date of Service: 07/10/2019 8:30 AM Medical Record Number: [id number] Patient Account Number: [id number] Date of Birth/Sex: 1965-07-17 (54 y.o. M) Treating RN: [name] Primary Care Koston Hennes: [name] Other Clinician: Referring Korban Shearer: [name] Treating Girard Koontz/Extender: Melburn Hake, HOYT Weeks in Treatment: 17 Clinic Level of Care Assessment Items TOOL 4 Quantity Score []  - Use when only an EandM is performed on FOLLOW-UP visit 0 ASSESSMENTS - Nursing Assessment / Reassessment X - Reassessment of Co-morbidities (includes updates in patient status) 1 10 X- 1 5 Reassessment of Adherence to Treatment Plan ASSESSMENTS -  Wound and Skin Assessment / Reassessment X - Simple Wound Assessment / Reassessment - one wound 1 5 []  - 0 Complex Wound Assessment / Reassessment - multiple wounds []  - 0 Dermatologic / Skin Assessment (not related to wound area) ASSESSMENTS - Focused Assessment []  - Circumferential Edema Measurements - multi extremities 0 []  - 0 Nutritional Assessment / Counseling / Intervention []  - 0 Lower Extremity Assessment (monofilament, tuning fork, pulses) []  - 0 Peripheral Arterial Disease Assessment (using hand held doppler) ASSESSMENTS - Ostomy and/or Continence Assessment and Care []  - Incontinence Assessment and Management 0 []  - 0 Ostomy Care Assessment and Management (repouching, etc.) PROCESS - Coordination of Care X - Simple Patient / Family Education for ongoing care 1 15 []  - 0 Complex (extensive) Patient / Family Education for ongoing care []  - 0 Staff obtains Programmer, systems, Records, Test Results / Process Orders []  - 0 Staff telephones HHA, Nursing Homes / Clarify orders / etc []  - 0 Routine Transfer to another Facility (non-emergent condition) []  - 0 Routine Hospital Admission (non-emergent condition) []  - 0 New Admissions / Biomedical engineer / Ordering NPWT, Apligraf, etc. []  - 0 Emergency Hospital Admission (emergent condition) X- 1 10 Simple Discharge Coordination []  - 0 Complex (extensive) Discharge Coordination PROCESS - Special Needs []  - Pediatric / Minor Patient Management 0 []  - 0 Isolation Patient Management []  - 0 Hearing / Language / Visual special needs []  - 0 Assessment of Community assistance (transportation, D/C planning, etc.) []  - 0 Additional assistance / Altered mentation []  - 0 Support Surface(s) Assessment (bed, cushion, seat, etc.) INTERVENTIONS - Wound Cleansing / Measurement EVRETT, HAKIM. ([id number]) X- 1 5 Simple Wound Cleansing - one wound []  - 0 Complex Wound Cleansing - multiple wounds X- 1 5 Wound Imaging  (photographs - any number of wounds) []  - 0 Wound Tracing (instead of photographs) X- 1 5 Simple Wound Measurement - one wound []  - 0 Complex Wound Measurement -  multiple wounds INTERVENTIONS - Wound Dressings []  - Small Wound Dressing one or multiple wounds 0 X- 1 15 Medium Wound Dressing one or multiple wounds []  - 0 Large Wound Dressing one or multiple wounds []  - 0 Application of Medications - topical []  - 0 Application of Medications - injection INTERVENTIONS - Miscellaneous []  - External ear exam 0 []  - 0 Specimen Collection (cultures, biopsies, blood, body fluids, etc.) []  - 0 Specimen(s) / Culture(s) sent or taken to Lab for analysis []  - 0 Patient Transfer (multiple staff / Civil Service fast streamer / Similar devices) []  - 0 Simple Staple / Suture removal (25 or less) []  - 0 Complex Staple / Suture removal (26 or more) []  - 0 Hypo / Hyperglycemic Management (close monitor of Blood Glucose) []  - 0 Ankle / Brachial Index (ABI) - do not check if billed separately X- 1 5 Vital Signs Has the patient been seen at the hospital within the last three years: Yes Total Score: 80 Level Of Care: New/Established - Level 3 Electronic Signature(s) Signed: 07/10/2019 10:39:21 AM By: [name] Entered By: [name] on 07/10/2019 08:53:54 [name] ([id number]) -------------------------------------------------------------------------------- Encounter Discharge Information Details Patient Name: [name] Date of Service: 07/10/2019 8:30 AM Medical Record Number: [id number] Patient Account Number: [id number] Date of Birth/Sex: [birth year] (54 y.o. M) Treating RN: [name] Primary Care Wendel Homeyer: [name] Other Clinician: Referring Lakrista Scaduto: [name] Treating Dirk Vanaman/Extender: Melburn Hake, HOYT Weeks in Treatment: 17 Encounter Discharge Information Items Discharge Condition: Stable Ambulatory Status: Ambulatory Discharge Destination:  Home Transportation: Private Auto Accompanied By: self Schedule Follow-up Appointment: Yes Clinical Summary of Care: Electronic Signature(s) Signed: 07/10/2019 10:39:21 AM By: [name] Entered By: [name] on 07/10/2019 08:54:48 [name] ([id number]) -------------------------------------------------------------------------------- Lower Extremity Assessment Details Patient Name: [name] Date of Service: 07/10/2019 8:30 AM Medical Record Number: [id number] Patient Account Number: [id number] Date of Birth/Sex: February 18, 1965 (53 y.o. M) Treating RN: Cornell Barman Primary Care Trey Gulbranson: [name] Other Clinician: Referring Maryjane Benedict: [name] Treating Adele Milson/Extender: Melburn Hake, HOYT Weeks in Treatment: 17 Edema Assessment Assessed: [Left: No] [Right: No] Edema: [Left: N] [Right: o] Vascular Assessment Pulses: Dorsalis Pedis Palpable: [Right:Yes] Electronic Signature(s) Signed: 07/10/2019 5:43:28 PM By: Gretta Cool, BSN, RN, CWS, Kim RN, BSN Entered By: Gretta Cool, BSN, RN, CWS, Kim on 07/10/2019 08:38:33 [name] ([id number]) -------------------------------------------------------------------------------- Multi Wound Chart Details Patient Name: CHIDI, SHIRER. Date of Service: 07/10/2019 8:30 AM Medical Record Number: [id number] Patient Account Number: [id number] Date of Birth/Sex: 04-19-1965 (54 y.o. M) Treating RN: [name] Primary Care Luellen Howson: [name] Other Clinician: Referring Nicole Hafley: [name] Treating Jensen Kilburg/Extender: Melburn Hake, HOYT Weeks in Treatment: 17 Vital Signs Height(in): 74 Pulse(bpm): 73 Weight(lbs): 308 Blood Pressure(mmHg): 155/70 Body Mass Index(BMI): 40 Temperature(F): 98.3 Respiratory Rate(breaths/min): 18 Photos: [N/A:N/A] Wound Location: Right, Lateral Foot N/A N/A Wounding Event: Gradually Appeared N/A N/A Primary Etiology: Diabetic Wound/Ulcer of the Lower N/A  N/A Extremity Comorbid History: Hypertension, Type II Diabetes, N/A N/A Neuropathy Date Acquired: 03/12/2019 N/A N/A Weeks of Treatment: 17 N/A N/A Wound Status: Open N/A N/A Pending Amputation on Yes N/A N/A Presentation: Measurements L x W x D (cm) 0.6x0.6x0.7 N/A N/A Area (cm) : 0.283 N/A N/A Volume (cm) : 0.198 N/A N/A % Reduction in Area: 55.50% N/A N/A % Reduction in Volume: -55.90% N/A N/A Starting Position 1 (o'clock): 6 Ending Position 1 (o'clock): 1 Maximum Distance 1 (cm): 0.4 Undermining: Yes N/A N/A Classification: Grade 1 N/A N/A Exudate Amount: Medium N/A N/A Exudate  Type: Serous N/A N/A Exudate Color: amber N/A N/A Wound Margin: Flat and Intact N/A N/A Granulation Amount: Medium (34-66%) N/A N/A Granulation Quality: Pink N/A N/A Necrotic Amount: Medium (34-66%) N/A N/A Exposed Structures: Fat Layer (Subcutaneous Tissue) N/A N/A Exposed: Yes Fascia: No Tendon: No Muscle: No Joint: No Bone: No Epithelialization: None N/A N/A Treatment Notes Electronic Signature(s) KIYOSHI, SCHAAB ([id number]) Signed: 07/10/2019 10:39:21 AM By: [name] Entered By: [name] on 07/10/2019 08:50:47 [name] ([id number]) -------------------------------------------------------------------------------- [place] Details Patient Name: [name] Date of Service: 07/10/2019 8:30 AM Medical Record Number: [id number] Patient Account Number: [id number] Date of Birth/Sex: 04/06/1965 (54 y.o. M) Treating RN: [name] Primary Care Hakiem Malizia: [name] Other Clinician: Referring Keng Jewel: [name] Treating Teirra Carapia/Extender: Melburn Hake, HOYT Weeks in Treatment: 17 Active Inactive Nutrition Nursing Diagnoses: Impaired glucose control: actual or potential Goals: Patient/caregiver verbalizes understanding of need to maintain therapeutic glucose control per primary care physician Date Initiated: 03/13/2019 Target  Resolution Date: 04/11/2019 Goal Status: Active Interventions: Assess patient nutrition upon admission and as needed per policy Notes: Orientation to the Wound Care Program Nursing Diagnoses: Knowledge deficit related to the wound healing center program Goals: Patient/caregiver will verbalize understanding of the [place] Date Initiated: 03/13/2019 Target Resolution Date: 04/11/2019 Goal Status: Active Interventions: Provide education on orientation to the wound center Notes: Wound/Skin Impairment Nursing Diagnoses: Impaired tissue integrity Goals: Ulcer/skin breakdown will have a volume reduction of 30% by week 4 Date Initiated: 03/13/2019 Target Resolution Date: 04/11/2019 Goal Status: Active Interventions: Assess ulceration(s) every visit Notes: Electronic Signature(s) Signed: 07/10/2019 10:39:21 AM By: [name] Entered By: [name] on 07/10/2019 08:50:37 [name] ([id number]) -------------------------------------------------------------------------------- Pain Assessment Details Patient Name: [name] Date of Service: 07/10/2019 8:30 AM Medical Record Number: [id number] Patient Account Number: [id number] Date of Birth/Sex: 1965-01-18 (54 y.o. M) Treating RN: Cornell Barman Primary Care Neetu Carrozza: [name] Other Clinician: Referring Lexington Krotz: [name] Treating Derrik Mceachern/Extender: Melburn Hake, HOYT Weeks in Treatment: 17 Active Problems Location of Pain Severity and Description of Pain Patient Has Paino No Site Locations Pain Management and Medication Current Pain Management: Electronic Signature(s) Signed: 07/10/2019 5:43:28 PM By: Gretta Cool, BSN, RN, CWS, Kim RN, BSN Entered By: Gretta Cool, BSN, RN, CWS, Kim on 07/10/2019 08:36:04 [name] ([id number]) -------------------------------------------------------------------------------- Patient/Caregiver Education Details Patient Name: [name] Date of  Service: 07/10/2019 8:30 AM Medical Record Number: [id number] Patient Account Number: [id number] Date of Birth/Gender: 01/13/1965 (54 y.o. M) Treating RN: [name] Primary Care Physician: [name] Other Clinician: Referring Physician: [name] Treating Physician/Extender: Sharalyn Ink in Treatment: 17 Education Assessment Education Provided To: Patient Education Topics Provided Wound/Skin Impairment: Handouts: Caring for Your Ulcer Methods: Demonstration, Explain/Verbal Responses: State content correctly Electronic Signature(s) Signed: 07/10/2019 10:39:21 AM By: [name] Entered By: [name] on 07/10/2019 08:54:14 [name] ([id number]) -------------------------------------------------------------------------------- Wound Assessment Details Patient Name: [name] Date of Service: 07/10/2019 8:30 AM Medical Record Number: [id number] Patient Account Number: [id number] Date of Birth/Sex: 01-21-1965 (54 y.o. M) Treating RN: Cornell Barman Primary Care Pierina Schuknecht: [name] Other Clinician: Referring Monigue Spraggins: [name] Treating Vitalia Stough/Extender: Melburn Hake, HOYT Weeks in Treatment: 17 Wound Status Wound Number: 4 Primary Etiology: Diabetic Wound/Ulcer of the Lower Extremity Wound Location: Right, Lateral Foot Wound Status: Open Wounding Event: Gradually Appeared Comorbid History: Hypertension, Type II Diabetes, Neuropathy Date Acquired: 03/12/2019 Weeks Of Treatment: 17 Clustered Wound: No Pending Amputation On Presentation Photos Wound Measurements Length: (cm) 0.6 %  Redu Width: (cm) 0.6 % Redu Depth: (cm) 0.7 Epithe Area: (cm) 0.283 [place] Volume: (cm) 0.198 Under Sta End Max ction in Area: 55.5% ction in Volume: -55.9% lialization: None ling: No mining: Yes rting Position (o'clock): 6 ing Position (o'clock): 1 imum Distance: (cm) 0.4 Wound Description Classification: Grade 1 Foul O Wound Margin:  Flat and Intact Slough Exudate Amount: Medium Exudate Type: Serous Exudate Color: amber dor After Cleansing: No /Fibrino Yes Wound Bed Granulation Amount: Medium (34-66%) Exposed Structure Granulation Quality: Pink Fascia Exposed: No Necrotic Amount: Medium (34-66%) Fat Layer (Subcutaneous Tissue) Exposed: Yes Necrotic Quality: Adherent Slough Tendon Exposed: No Muscle Exposed: No Joint Exposed: No Bone Exposed: No Treatment Notes Wound #4 (Right, Lateral Foot) MAKAVELI, HOARD ([id number]) Notes scell, gauze, conform Electronic Signature(s) Signed: 07/10/2019 5:43:28 PM By: Gretta Cool, BSN, RN, CWS, Kim RN, BSN Entered By: Gretta Cool, BSN, RN, CWS, Kim on 07/10/2019 08:38:10 [name] ([id number]) -------------------------------------------------------------------------------- Vitals Details Patient Name: [name] Date of Service: 07/10/2019 8:30 AM Medical Record Number: [id number] Patient Account Number: [id number] Date of Birth/Sex: [birth year] (54 y.o. M) Treating RN: Cornell Barman Primary Care Mairead Schwarzkopf: [name] Other Clinician: Referring Yaire Kreher: [name] Treating Arthurine Oleary/Extender: Melburn Hake, HOYT Weeks in Treatment: 17 Vital Signs Time Taken: 08:30 Temperature (F): 98.3 Height (in): 74 Pulse (bpm): 73 Weight (lbs): 308 Respiratory Rate (breaths/min): 18 Body Mass Index (BMI): 39.5 Blood Pressure (mmHg): 155/70 Reference Range: 80 - 120 mg / dl Electronic Signature(s) Signed: 07/10/2019 5:43:28 PM By: Gretta Cool, BSN, RN, CWS, Kim RN, BSN Entered By: Gretta Cool, BSN, RN, CWS, Kim on 07/10/2019 08:35:57

## 2019-07-15 NOTE — Progress Notes (Signed)
[facility] Note  07/16/2019     CHIEF COMPLAINT Patient presents for Diabetic Eye Exam   HISTORY OF PRESENT ILLNESS: [name] is a 54 y.o. male who presents to the clinic today for:   HPI    Diabetic Eye Exam    Vision is stable.  Diabetes characteristics include Type 2, on insulin, controlled with diet and taking oral medications.  This started 20 years ago.  Blood sugar level is controlled.  Last Blood Glucose 84 (1 hr ago).  Last A1C 8 (03/2019).  I, the attending physician,  performed the HPI with the patient and updated documentation appropriately.          Comments    54 y/o male pt referred by Dr. [place] on 6.2.21 for eval of PDR w/o edema OU.  VA good OU cc.  Denies pain, FOL, floaters.  No gtts.       Last edited by Bernarda Caffey, MD on 07/16/2019  2:31 PM. (History)    pt is here on the referral of Dr. Genevie Ann for concern of PDR OU, pt is previous pt of Dr. Baird Cancer, pt has had PRP and surgery (to cauterize blood vessels) with Dr. Baird Cancer (2011/2015), he states he has not had any issues with his vision since then, last visit with Dr. Baird Cancer was 2015, pt states last A1c was 7.9  Referring physician: [place], Heather OD [address] [place], [place] [address]  HISTORICAL INFORMATION:   Selected notes from the MEDICAL RECORD NUMBER Referred by Dr. [place] LEE: 06.02.2021 BCVA OD: 20/40+2 OS: 20/30 Ocular Hx-laser with Dr. Baird Cancer for DR 2015 PMH-DM, HTN, cholesterol    CURRENT MEDICATIONS: No current outpatient medications on file. (Ophthalmic Drugs)   No current facility-administered medications for this visit. (Ophthalmic Drugs)   Current Outpatient Medications (Other)  Medication Sig  . Continuous Blood Gluc Sensor (FREESTYLE LIBRE 14 DAY SENSOR) MISC SMARTSIG:1 Topical Every 2 Weeks  . empagliflozin (JARDIANCE) 25 MG TABS tablet Take 25 mg by mouth daily.  . fluticasone (FLONASE ALLERGY RELIEF) 50 MCG/ACT nasal spray  Place 2 sprays into both nostrils daily as needed for allergies or rhinitis.  Marland Kitchen glimepiride (AMARYL) 2 MG tablet Take 2 mg by mouth 2 (two) times daily.  . Insulin Degludec (TRESIBA FLEXTOUCH) 200 UNIT/ML SOPN Inject 100 Units into the skin daily before breakfast.   . levofloxacin (LEVAQUIN) 500 MG tablet Take 500 mg by mouth daily.  . metFORMIN (GLUCOPHAGE) 1000 MG tablet Take 1,000 mg by mouth 2 (two) times daily.  . Multiple Vitamin (MULTIVITAMIN) tablet Take 1 tablet by mouth daily.  . Olmesartan-Amlodipine-HCTZ 40-10-25 MG TABS Take 1 tablet by mouth daily. Reported on 02/04/2015  . omega-3 acid ethyl esters (LOVAZA) 1 g capsule Take 1 capsule by mouth 2 (two) times daily.  . rosuvastatin (CRESTOR) 10 MG tablet Take 10 mg by mouth daily.   No current facility-administered medications for this visit. (Other)      REVIEW OF SYSTEMS: ROS    Positive for: Endocrine, Eyes   Negative for: Constitutional, Gastrointestinal, Neurological, Skin, Genitourinary, Musculoskeletal, HENT, Cardiovascular, Respiratory, Psychiatric, Allergic/Imm, Heme/Lymph   Last edited by Matthew Folks, COA on 07/16/2019  2:10 PM. (History)       ALLERGIES No Known Allergies  PAST MEDICAL HISTORY Past Medical History:  Diagnosis Date  . Broken ankle   . Broken arm   . Diabetes mellitus without complication ([place])    type 2  .  Diabetic retinopathy ([place])    PDR OU  . History of Bell's palsy   . Hyperlipidemia   . Hypertension   . Osteomyelitis ([place])    foot   Past Surgical History:  Procedure Laterality Date  . AMPUTATION Right 03/04/2015   Procedure: PARTIAL AMPUTATION RIGHT 5TH METATARSAL;  Surgeon: Landis Martins, DPM;  Location: [facility];  Service: Podiatry;  Laterality: Right;  . APPENDECTOMY    . COLONOSCOPY  11/17/2005   TAs - [place]  . COLONOSCOPY N/A 01/25/2019   Procedure: COLONOSCOPY;  Surgeon: Juanita Craver, MD;  Location: [facility] ENDOSCOPY;  Service: Endoscopy;  Laterality: N/A;  . EYE  SURGERY Bilateral    blood vessels -cautery  . HEMOSTASIS CLIP PLACEMENT  01/25/2019   Procedure: HEMOSTASIS CLIP PLACEMENT;  Surgeon: Juanita Craver, MD;  Location: [facility] ENDOSCOPY;  Service: Endoscopy;;  . I & D EXTREMITY Right 03/04/2015   Procedure: IRRIGATION AND DEBRIDEMENT RIGHT FOOT;  Surgeon: Landis Martins, DPM;  Location: [facility];  Service: Podiatry;  Laterality: Right;  . WISDOM TOOTH EXTRACTION     only 2 ext    FAMILY HISTORY Family History  Problem Relation Age of Onset  . Diabetes Father   . Colon cancer Father   . Colon polyps Father   . Esophageal cancer Neg Hx   . Rectal cancer Neg Hx   . Stomach cancer Neg Hx     SOCIAL HISTORY Social History   Tobacco Use  . Smoking status: Never Smoker  . Smokeless tobacco: Never Used  Vaping Use  . Vaping Use: Never used  Substance Use Topics  . Alcohol use: No  . Drug use: No         OPHTHALMIC EXAM:  Base Eye Exam    Visual Acuity (Snellen - Linear)      Right Left   Dist cc 20/25 - 20/25 +2   Dist ph cc NI NI   Correction: Glasses       Tonometry (Tonopen, 2:12 PM)      Right Left   Pressure 18 17       Pupils      Dark Light Shape React APD   Right 4 3 Round Brisk None   Left 4 3 Round Brisk None       Visual Fields (Counting fingers)      Left Right    Full Full       Extraocular Movement      Right Left    Full, Ortho Full, Ortho       Neuro/Psych    Oriented x3: Yes   Mood/Affect: Normal       Dilation    Both eyes: 1.0% Mydriacyl, 2.5% Phenylephrine @ 2:12 PM        Slit Lamp and Fundus Exam    Slit Lamp Exam      Right Left   Lids/Lashes Dermatochalasis - upper lid, Meibomian gland dysfunction Dermatochalasis - upper lid, Meibomian gland dysfunction   Conjunctiva/Sclera White and quiet White and quiet   Cornea Trace Debris in tear film Trace Debris in tear film   Anterior Chamber deep, narrow temporal angle deep, narrow temporal angle   Iris Round and dilated, No NVI Round and  dilated, No NVI   Lens 2+ Nuclear sclerosis, 2+ Cortical cataract 2+ Nuclear sclerosis, 2+ Cortical cataract, 1+ Posterior subcapsular cataract   Vitreous post vitrectomy post vitrectomy       Fundus Exam      Right Left  Disc Pink and Sharp Pink and Sharp   C/D Ratio 0.3 0.4   Macula Flat, Blunted foveal reflex, mild ERM, mild MA Flat, Blunted foveal reflex, rare, scattered MA   Vessels Vascular attenuation, mild tortuousity no NV Vascular attenuation, mild tortuousity, no NV   Periphery Attached, 360 PRP, scattered IRH Attached, excellent 360 PRP, scattered IRH        Refraction    Wearing Rx      Sphere Cylinder Axis Add   Right -8.75 +2.25 119 +2.50   Left -8.00 +1.00 060 +2.50   Age: 21 wks   Type: PAL       Manifest Refraction      Sphere Cylinder Axis Dist VA   Right -9.00 +2.00 120 20/25   Left -8.00 +1.00 065 20/25+2          IMAGING AND PROCEDURES  Imaging and Procedures for 07/16/2019  OCT, Retina - OU - Both Eyes       Right Eye Quality was good. Central Foveal Thickness: 338. Progression has no prior data. Findings include normal foveal contour, no IRF, no SRF, epiretinal membrane (Focal ERM ST macula).   Left Eye Quality was good. Central Foveal Thickness: 323. Progression has no prior data. Findings include normal foveal contour, no IRF, no SRF, epiretinal membrane.   Notes *Images captured and stored on drive  Diagnosis / Impression:  No DME OU   Clinical management:  See below  Abbreviations: NFP - Normal foveal profile. CME - cystoid macular edema. PED - pigment epithelial detachment. IRF - intraretinal fluid. SRF - subretinal fluid. EZ - ellipsoid zone. ERM - epiretinal membrane. ORA - outer retinal atrophy. ORT - outer retinal tubulation. SRHM - subretinal hyper-reflective material. IRHM - intraretinal hyper-reflective material        Fluorescein Angiography Optos (Transit OD)       Right Eye   Progression has no prior data. Early  phase findings include vascular perfusion defect, microaneurysm, staining. Mid/Late phase findings include microaneurysm, vascular perfusion defect, leakage (No NV).   Left Eye   Progression has no prior data. Early phase findings include microaneurysm, staining, vascular perfusion defect. Mid/Late phase findings include staining, microaneurysm, vascular perfusion defect (No NV).   Notes **Images stored on drive**  Impression: Stable PDR  Extensive PRP OU Patches of vascular non-perfusion OU Late leaking MA OU No NV OU                  ASSESSMENT/PLAN:    ICD-10-CM   1. 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 Retinal edema  H35.81 OCT, Retina - OU - Both Eyes  3. Essential hypertension  I10   4. Hypertensive retinopathy of both eyes  H35.033 Fluorescein Angiography Optos (Transit OD)  5. Combined forms of age-related cataract of both eyes  H25.813     1,2. 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 Baird Cancer -- pt has not been seen at [facility] retina since 2015  - history of extensive PRP OU and PPV OU ~2011-2015 - The incidence, risk factors for progression, natural history and treatment options for diabetic retinopathy were discussed with patient.   - The need for close monitoring of blood glucose, blood pressure, and serum lipids, avoiding cigarette or any type of tobacco, and the need for long term follow up was also discussed with patient. - exam shows extensive PRP and  no active NV, minimal retinopathy - FA today (07.14.21) shows late-leaking MA, vascular nonperfusion, no NV OU - OCT without diabetic macular edema, both eyes  - discussed findings and prognosis - f/u in 4 months, DFE, OCT  3,4. Hypertensive retinopathy OU - discussed importance of tight BP control - monitor  5. Mixed Cataract OU - The symptoms of cataract, surgical options, and  treatments and risks were discussed with patient. - discussed diagnosis and progression - not yet visually significant - monitor for now    Ophthalmic Meds Ordered this visit:  No orders of the defined types were placed in this encounter.      Return in about 4 months (around 11/16/2019) for f/u PDR OU, DFE, OCT.  There are no Patient Instructions on file for this visit.   Explained the diagnoses, plan, and follow up with the patient and they expressed understanding.  Patient expressed understanding of the importance of proper follow up care.   This document serves as a record of services personally performed by Gardiner Sleeper, MD, PhD. It was created on their behalf by Roselee Nova, COMT. The creation of this record is the provider's dictation and/or activities during the visit.  Electronically signed by: Roselee Nova, COMT 07/17/19 8:05 AM  Gardiner Sleeper, M.D., Ph.D. Diseases & Surgery of the Retina and Vitreous [facility]  I have reviewed the above documentation for accuracy and completeness, and I agree with the above. Gardiner Sleeper, M.D., Ph.D. 07/17/19 8:05 AM   Abbreviations: M myopia (nearsighted); A astigmatism; H hyperopia (farsighted); P presbyopia; Mrx spectacle prescription;  CTL contact lenses; OD right eye; OS left eye; OU both eyes  XT exotropia; ET esotropia; PEK punctate epithelial keratitis; PEE punctate epithelial erosions; DES dry eye syndrome; MGD meibomian gland dysfunction; ATs artificial tears; PFAT's preservative free artificial tears; [place] nuclear sclerotic cataract; PSC posterior subcapsular cataract; ERM epi-retinal membrane; PVD posterior vitreous detachment; RD retinal detachment; DM diabetes mellitus; DR diabetic retinopathy; NPDR non-proliferative diabetic retinopathy; PDR proliferative diabetic retinopathy; CSME clinically significant macular edema; DME diabetic macular edema; dbh dot blot hemorrhages; CWS cotton wool spot;  POAG primary open angle glaucoma; C/D cup-to-disc ratio; HVF humphrey visual field; GVF goldmann visual field; OCT optical coherence tomography; IOP intraocular pressure; BRVO Branch retinal vein occlusion; CRVO central retinal vein occlusion; CRAO central retinal artery occlusion; BRAO branch retinal artery occlusion; RT retinal tear; SB scleral buckle; PPV pars plana vitrectomy; VH Vitreous hemorrhage; PRP panretinal laser photocoagulation; IVK intravitreal kenalog; VMT vitreomacular traction; MH Macular hole;  NVD neovascularization of the disc; NVE neovascularization elsewhere; AREDS age related eye disease study; ARMD age related macular degeneration; POAG primary open angle glaucoma; EBMD epithelial/anterior basement membrane dystrophy; ACIOL anterior chamber intraocular lens; IOL intraocular lens; PCIOL posterior chamber intraocular lens; Phaco/IOL phacoemulsification with intraocular lens placement; [place] photorefractive keratectomy; LASIK laser assisted in situ keratomileusis; HTN hypertension; DM diabetes mellitus; COPD chronic obstructive pulmonary disease

## 2019-07-16 ENCOUNTER — Other Ambulatory Visit: Payer: Self-pay

## 2019-07-16 ENCOUNTER — Encounter (INDEPENDENT_AMBULATORY_CARE_PROVIDER_SITE_OTHER): Payer: Self-pay | Admitting: Ophthalmology

## 2019-07-16 ENCOUNTER — Ambulatory Visit (INDEPENDENT_AMBULATORY_CARE_PROVIDER_SITE_OTHER): Payer: Managed Care, Other (non HMO) | Admitting: Ophthalmology

## 2019-07-16 DIAGNOSIS — H3581 Retinal edema: Secondary | ICD-10-CM | POA: Diagnosis not present

## 2019-07-16 DIAGNOSIS — E113553 Type 2 diabetes mellitus with stable proliferative diabetic retinopathy, bilateral: Secondary | ICD-10-CM | POA: Diagnosis not present

## 2019-07-16 DIAGNOSIS — H35033 Hypertensive retinopathy, bilateral: Secondary | ICD-10-CM | POA: Diagnosis not present

## 2019-07-16 DIAGNOSIS — I10 Essential (primary) hypertension: Secondary | ICD-10-CM

## 2019-07-16 DIAGNOSIS — H25813 Combined forms of age-related cataract, bilateral: Secondary | ICD-10-CM

## 2019-07-22 ENCOUNTER — Encounter: Payer: Managed Care, Other (non HMO) | Admitting: Physician Assistant

## 2019-07-22 ENCOUNTER — Other Ambulatory Visit: Payer: Self-pay

## 2019-07-22 DIAGNOSIS — E11621 Type 2 diabetes mellitus with foot ulcer: Secondary | ICD-10-CM | POA: Diagnosis not present

## 2019-07-22 NOTE — Progress Notes (Addendum)
[place], [place] ([id number]) Visit Report for 07/22/2019 Chief Complaint Document Details Patient Name: [name]. Date of Service: 07/22/2019 10:00 AM Medical Record Number: [id number] Patient Account Number: [id number] Date of Birth/Sex: Nov 21, 1965 (54 y.o. M) Treating RN: Cornell Barman Primary Care Provider: [name] Other Clinician: Referring Provider: [name] Treating Provider/Extender: Melburn Hake, Shelli Portilla Weeks in Treatment: 18 Information Obtained from: Patient Chief Complaint Right lateral foot ulcer Electronic Signature(s) Signed: 07/22/2019 10:02:19 AM By: [name] PA-C Entered By: [name] on 07/22/2019 10:02:19 [name] ([id number]) -------------------------------------------------------------------------------- Debridement Details Patient Name: [name] Date of Service: 07/22/2019 10:00 AM Medical Record Number: [id number] Patient Account Number: [id number] Date of Birth/Sex: 12/30/1965 (54 y.o. M) Treating RN: Cornell Barman Primary Care Provider: [name] Other Clinician: Referring Provider: [name] Treating Provider/Extender: Melburn Hake, Yamilka Lopiccolo Weeks in Treatment: 18 Debridement Performed for Wound #4 Right,Lateral Foot Assessment: Performed By: Physician STONE III, Adalaya Irion E., PA-C Debridement Type: Debridement Severity of Tissue Pre Debridement: Fat layer exposed Level of Consciousness (Pre- Awake and Alert procedure): Pre-procedure Verification/Time Out Yes - 10:10 Taken: Pain Control: Lidocaine Total Area Debrided (L x W): 0.5 (cm) x 0.7 (cm) = 0.35 (cm) Tissue and other material Viable, Non-Viable, Callus, Slough, Subcutaneous, Slough debrided: Level: Skin/Subcutaneous Tissue Debridement Description: Excisional Instrument: Curette Bleeding: Minimum Hemostasis Achieved: Pressure Response to Treatment: Procedure was tolerated well Level of Consciousness (Post- Awake and  Alert procedure): Post Debridement Measurements of Total Wound Length: (cm) 1.8 Width: (cm) 1.8 Depth: (cm) 1.2 Volume: (cm) 3.054 Character of Wound/Ulcer Post Debridement: Stable Severity of Tissue Post Debridement: Fat layer exposed Post Procedure Diagnosis Same as Pre-procedure Electronic Signature(s) Signed: 07/22/2019 4:27:08 PM By: [name] PA-C Signed: 07/24/2019 6:25:29 PM By: Gretta Cool, BSN, RN, CWS, Kim RN, BSN Entered By: Gretta Cool, BSN, RN, CWS, Kim on 07/22/2019 10:13:36 [name] ([id number]) -------------------------------------------------------------------------------- HPI Details Patient Name: [name] Date of Service: 07/22/2019 10:00 AM Medical Record Number: [id number] Patient Account Number: [id number] Date of Birth/Sex: July 29, 1965 (54 y.o. M) Treating RN: Cornell Barman Primary Care Provider: [name] Other Clinician: Referring Provider: [name] Treating Provider/Extender: Melburn Hake, Jeanpierre Thebeau Weeks in Treatment: 18 History of Present Illness HPI Description: This 54 year old male comes with an ulcerated area on the plantar aspect of the right foot which she's had for approximately a month. I have known him from a previous visit at [facility] wound center and was treated in the months of April and May 2016 and rapidly healed a left plantar ulcer with a total contact cast. He has been a diabetic for about 16 years and tries to keep active and is fairly compliant with his diabetes management. 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 Past medical history significant for hypertension, hyperlipidemia, and status post appendectomy 1993. He does not smoke or drink alcohol. 01/14/2015 -- the patient had tolerated his total contact cast very well and had no problems and has had no systemic symptoms. However when his total contact cast was cut open he had excessive amount of purulent drainage in spite of being on antibiotics. He had had  a recent x-ray done in the ER 12/22/2014 which showed IMPRESSION:No evidence of osseous erosion. Known soft tissue ulceration is not well characterized on radiograph. Scattered vascular calcifications seen. his last hemoglobin A1c in December was 7.3. He has been on Augmentin and doxycycline for the last 2 weeks. 01/21/2015 -- his culture grew rare growth of Pantoea species an MR moderate growth of Candida  parapsilosis. it is sensitive to levofloxacin. He has not heard back from the insurance company regarding his hyperbaric oxygen therapy. His MRI has not been done yet and we will try and get him an earlier date 01/28/2015 -- MRI was done last night -- IMPRESSION:1. Soft tissue ulcer overlying the plantar aspect of the fifth metatarsal head extending to the cortex. Subcortical marrow edema in the fifth metatarsal head with corresponding T1 hypointensity is concerning for early osteomyelitis of the plantar lateral aspect of the fifth metatarsal head. Chest x-ray done on 01/14/2015 shows bronchiectatic changes without infiltrate. EKG done on generally 17 2017 shows a normal sinus rhythm and is a normal EKG. 02/04/2015 -- he was asked to see Dr. [name] last week and had 2 appointments but had to cancel both due to pressures of work. Last night he has woken up with severe pain in the foot and leg and it is swollen up. No fever or no change in his blood glucose. Addendum: I spoke with Dr. [name] who kindly agreed to accept the patient for inpatient therapy and have also opened to the hospitalist Dr. [name], and discuss details of the management including PICC line and repeat cultures. 02/12/2015-- -- was seen by Dr. [name] in the hospital and a PICC line was placed. He was to receive Ceftazidime 2 g every 12 hourly, oral levofloxacin 750 mg every 24 hourly and oral fluconazole 200 mg daily. The antibiotics were to be given for 4 weeks except the Diflucan was to be given for the first 2  weeks. Reviewed note from 02/10/2015 -- and Dr. [name] had recommended management for growth of MSSA and Serratia. He switched him from ceftazidime to ceftriaxone 2 g every 24 hours. Levofloxacin was stopped and he would continue on fluconazole for another week. He had asked me to decide whether further imaging was necessary and whether surgical debridement of the infected bone was needed. He is doing well and has been off work for this week and we will keep him off the next week. 02/22/2015 -- he was seen by my colleague on 01/19/2015 and at that time an incision and drainage was done on his right lateral forefoot on the dorsum. Today when I probed this wound it is frankly draining pus and it communicates with the ulcer on the plantar aspect of his right foot. The patient is still on IV ceftriaxone 2 g every 24 hours and is to be seen by Dr. [name] on Friday. 03/19/2015 -- On 03/04/2015 I spoke to Dr. 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 He recommended operative debridement which would probably result in the fifth metatarsal head and toe amputation.The patient would be referred back to [place] once he was done with surgery. He was admitted to [facility] yesterday and had surgery done by podiatry for a right fifth metatarsal acute osteomyelitis with cellulitis and abscess. He had a right foot incision and drainage with fifth metatarsal partial amputation and removal of toe infected bone and soft tissue with cultures. The wound was partially closed and packing of the distal end was done. Patient was already on cefepime 2 g IV every 8 hourly and put on vancomycin pending final cultures. He had grown moderate gram-negative rods, and later found to be rare diphtheroids. I received a call from Dr. Cannon Kettle  the podiatrist and we  discussed the above. On 03/10/2015 he was found positive for influenza a and has been put on Tamiflu. Since his discharge he has been seen by Dr. Cannon Kettle who is planning to remove his sutures this coming week. He was reviewed by Dr. [name] on 03/17/2015 and his Diflucan was stopped and vancomycin. After the 3 doses he is taking. He is going to change Ceftazidime to Zosyn 3.375 g IV every 8 hours. 03/25/2015 -- he was seen by the podiatrist a couple of days ago and the sutures were removed. She will follow back with him in 4 weeks' time and at that time x-rays will be taken and a custom molded insert would be made for his shoe. 04/01/2015 -- he was seen by Dr. [name] on 03/29/2015 who pulled the PICC line stopped his IV antibiotics and recommended starting doxycycline and levofloxacin for 2 weeks. He also stop the fluconazole. The patient will follow up with him only when necessary. Readmission: 03/15/17 on evaluation today patient presents for initial evaluation concerning the new issues although he has previously been evaluated in our clinic. Unfortunately the previous evaluation led to the patient having to proceed to amputation and so he is somewhat nervous about being here [name], [name] ([id number]) today. With that being said he has a very slight blister that occurred on the plantar aspect more medial on the right great toe that has been present for just a very short amount of time, several days. With that being said he felt like initially when he called that he somewhat overreacted but due to the fact that his previous issue led to amputation he is very cautious these days I explained that he did the right thing. With that being said he has been tolerating the dressing changes without complication mainly he just been covering this. He does not have any discomfort in secondary to neuropathy it's unlikely that he feels much. With that being said he does tell me that the issue that  he had here is that he went barefoot when he knows he should not have which subsequently led to the blisters. He states is definitely not doing that anymore. His most recent hemoglobin A1c was December and registered 6.9 his ABI today was 1.1. 03/27/17 on evaluation today patient appears to be doing great in regard to his right great toe ulcer. He has been tolerating the dressing changes without complication. The good news is this is making excellent progress and he seems to be caring for this in an excellent fashion. I see no evidence of breakdown that would make me concerned that he was at risk for infection/amputation. This has obviously been his concern due to the fifth toe amputation that was necessitated previous when he had a similar issue. Nonetheless this seems to be progressing much more nicely. Readmission: 03/13/2019 upon evaluation today patient presents for reevaluation here in the clinic concerning issues he has been having with his right foot on the lateral portion at the proximal end of the metatarsal. Subsequently he tells me that this just opened up in the past couple of days or so and the erythema really began about 24 to 48 hours ago. Fortunately there is no signs of systemic infection. 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 That left him with this bony prominence that is where the wound is currently. There is erythema surrounding that does have me concerned about cellulitis. With that being  said he was noncompressible as far as ABIs are concerned I do think we can need to check into arterial studies at this point. Patient does have a history of hypertension along with diabetes mellitus type 2. He also tends to develop quite a bit of callus white often. 03/20/2019 upon evaluation today patient appears to be doing very well in regard to his wounds currently. He has been tolerating the dressing changes without complication.  Fortunately there is no signs of infection. His ABIs were good at this point and registered at 1.07 on the left and 1.09 on the right. In regard to the x-ray this was negative as well for any signs of osteomyelitis. The patient also seems to be doing better in regard to the infection. He still has several days of the antibiotic left at this point best the Bactrim DS and he seems to be doing very well with this. Overall I am extremely pleased with the progress in 1 week's time he is going require some sharp debridement today however. 03/27/2019 upon evaluation today patient actually appears to be making some progress in regard to his wound. It is a little deeper but measuring smaller which is good news due to the fact that again were clear away some of the necrotic tissue which is why the depth is increasing a little bit. Nonetheless after like were getting very close to being down at a good wound bed. Fortunately there is no signs of active infection at this time. There is a little bit of erythema immediately surrounding the wound that we do want to be very cognizant of and careful about. For that reason I am going to go ahead and extend his antibiotic today for an additional 10 days that is the Bactrim. 04/03/2019 upon evaluation today patient appears to be making some progress. I do feel like the Annitta Needs is doing a good job along with the alginate is being packed into the wound space behind. He has been tolerating the dressing changes without complication. Fortunately there is no signs of active infection at this time. No fevers, chills, nausea, vomiting, or diarrhea. 04/10/2019 upon evaluation today patient appears to be making progress. The wound is not quite as deep but he still does not have a great wound surface yet for working on this and the Santyl does seem to be cleaning things up. Fortunately there is no evidence of active infection at this time. No fevers, chills, nausea, vomiting, or  diarrhea. 04/17/2019 upon evaluation today patient appears to be doing excellent at this time in regard to his foot ulcer. I do believe the Santyl with the alginate packed in behind has been of benefit for him and I am very pleased in this regard. With that being said I am feeling like the wound surface is improving quite a bit each time I see him still I do not believe we are at the point of stating that the wound bed is perfect but again were making progress. The patient is going require some sharp debridement today. 4/22; this is a patient who is using Santyl with calcium alginate. Apparently his wound dimensions have been improving. Patient is changing the dressing himself. He has a surgical shoe. He works in a sitting position therefore is not on his feet all that much. There is undermining 05/01/2019 upon evaluation today patient appears to be doing a little better in regard to the size of his wound though he still has some depth. This does seem to be  much cleaner has been using Santyl on the base of the wound followed by packing with silver alginate and behind. Nonetheless I do believe that based on what we are seeing we may need to switch to a collagen type dressing endoform may actually be excellent for him. 05/08/2019 upon evaluation today patient's wound actually appears to be showing better granulation tissue in the base of the wound. With that being said he does have some epiboly noted around the edges of the wound especially plantar and more distal. Subsequently this is can require sharp debridement today. Fortunately there is no signs of active systemic infection though I do feel like there is some local cellulitis noted at this point. The patient also notes has had increased drainage. 5/13; wound volume quite a bit improved this week down to 3 mm. Patient states pain and drainage better. Culture from last week grew staph aureus [not MRSA] and this is sensitive to quinolones and he is on  Levaquin. HOWEVER he also grew abundant Enterococcus faecalis treatment of choice for this is ampicillin. Quinolone coverage is unreliable 5/20 completing the Augmentin. Small wound on the right lateral foot. Thick rolled edges around the wound. Using silver alginate 05/29/2019 upon evaluation today patient appears to be doing better compared to last time I saw him. Fortunately there is no signs of active infection and I do feel like he is actually making good progress. Overall the quality of the wound bed along with the size looks improved and the inflammation/erythema that was previously noted by myself when I saw the couple weeks back actually has also resolved and this is great. Overall very pleased with how things stand. 06/06/2019 upon evaluation today patient actually appears to be doing quite well with regard to his foot ulcer with regard to some of the granulation were seen at this point. There does not appear to be any evidence of systemic or local infection although he still has discomfort I feel like things are moving in a slow but surely correct direction. I think we may need to help fill some of the space however even though he is using the collagen we may need to have something behind this such as a plain packing strip to try to help hold the collagen in the base of the wound. 06/12/2019 upon evaluation today patient appears to be doing well with regard to his foot ulcer. This seems to be making some slow but steady progress. The wound does not appear to be as deep today compared to where it was previous. Fortunately there is no signs of active infection at this time. No fevers, chills, nausea, vomiting, or diarrhea. 06/19/2019 on evaluation today patient's wound does not appear to be doing quite as well as what I would like to see. Fortunately there is no signs of systemic infection at this time which is great news. With that being said I do believe that the patient needs to have a wound  culture as I do [name], [name]. ([id number]) believe he likely has a local infection that needs to be addressed. He is also can require some debridement today as well. 06/26/2019 upon evaluation today patient's wound actually appears to be showing signs of excellent improvement today. There does not appear to be any evidence of active infection which is great news. There does not appear to be significant erythema at this point. The patient does have improvement overall in the appearance of the infection he has been taking the Augmentin the culture came  back showing no organism was grown at this point but nonetheless I do believe he has been doing well with the antibiotics I would continue that at this point. 07/03/2019 upon evaluation today patient appears to be doing about the same in regard to his wound. There does not appear to be any evidence of active infection and overall I am pleased with where things stand. The patient is tolerating the dressing changes without complication but it does not appear that they are packing the wound at this time which I think is going to slow down his healing prospects. 07/10/2019 upon evaluation today patient appears to be doing little better in my opinion in regard to the overall moisture collected in the base of the wound. They have been packing this with a little bit of silver alginate and that has done quite well. Overall I am pleased in that regard. Subsequently the redness does not appear to be any worse today I did actually go ahead and marked this since this can be 2 weeks between now and when I see him again I would make sure that if anything changes or worsens he will be able to notice this and go ahead and initiate treatment with the Augmentin. 07/22/2019 patient comes in today a little bit early for evaluation secondary to the fact that he was having issues with increased drainage and concern about infection. With that being said he does appear to have  developed a blister area again proximal to where the original wound opening was in although the wound bed itself actually appears to be doing somewhat better he has had increased drainage and tracking of fluid here due to this blister. Nonetheless I believe that that does need to be cleaned away today but again I am not really certain that I see a lot of evidence for infection at this point. Electronic Signature(s) Signed: 07/22/2019 11:14:50 AM By: [name] PA-C Entered By: [name] on 07/22/2019 11:14:50 [name] ([id number]) -------------------------------------------------------------------------------- Physical Exam Details Patient Name: GRAVES, NIPP Date of Service: 07/22/2019 10:00 AM Medical Record Number: [id number] Patient Account Number: [id number] Date of Birth/Sex: 09-09-1965 (54 y.o. M) Treating RN: Cornell Barman Primary Care Provider: [name] Other Clinician: Referring Provider: [name] Treating Provider/Extender: Melburn Hake, Shanai Lartigue Weeks in Treatment: 52 Constitutional Well-nourished and well-hydrated in no acute distress. Respiratory normal breathing without difficulty. Psychiatric this patient is able to make decisions and demonstrates good insight into disease process. Alert and Oriented x 3. pleasant and cooperative. Notes Upon inspection patient's wound bed did require sharp debridement I used a 5 mm scalpel in order to clear away the callus as well as necrotic debris from the surface of the wound post debridement wound bed actually appears to be doing much better. I think the main issue here comes down to friction and pressure getting to the area despite use of the offloading shoe I do not believe this is enough. I believe that he likely would benefit more total contact cast but being this is the right foot I do need a agreement from him to not drive with this cast on. Obviously since he drove himself today this would have  to be for next week. Electronic Signature(s) Signed: 07/22/2019 11:15:30 AM By: [name] PA-C Entered By: [name] on 07/22/2019 11:15:30 [name] ([id number]) -------------------------------------------------------------------------------- Physician Orders Details Patient Name: [name] Date of Service: 07/22/2019 10:00 AM Medical Record Number: [id number] Patient Account Number: [id number] Date  of Birth/Sex: 1965/06/02 (54 y.o. 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 - in office o Dial antibacterial soap, wash wounds, rinse and pat dry prior to dressing wounds Anesthetic (add to Medication List) Wound #4 Right,Lateral Foot o Topical Lidocaine 4% cream applied to wound bed prior to debridement (In Clinic Only). Primary Wound Dressing Wound #4 Right,Lateral Foot o Silver Alginate - Felt for off-loading Secondary Dressing Wound #4 Right,Lateral Foot o Gauze and Kerlix/Conform Dressing Change Frequency Wound #4 Right,Lateral Foot o Change dressing every other day. Follow-up Appointments Wound #4 Right,Lateral Foot o Return Appointment in 1 week. Edema Control Wound #4 Right,Lateral Foot o Patient to wear own compression stockings - wear daily Off-Loading Wound #4 Right,Lateral Foot o Open toe surgical shoe with peg assist. Electronic  Signature(s) Signed: 07/22/2019 4:27:08 PM By: [name] PA-C Signed: 07/24/2019 6:25:29 PM By: Gretta Cool, BSN, RN, CWS, Kim RN, BSN Entered By: Gretta Cool, BSN, RN, CWS, Kim on 07/22/2019 10:16:02 [name], [name] ([id number]) DRESHAWN, HENDERSHOTT ([id number]) -------------------------------------------------------------------------------- Problem List Details Patient Name: [name], [name] Date of Service: 07/22/2019 10:00 AM Medical Record Number: [id number] Patient Account Number: [id number] Date of Birth/Sex: [birth year] (54 y.o. 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/13/2019 No Yes exposed L03.115 Cellulitis of right lower limb 05/15/2019 No Yes L84 Corns and callosities 03/13/2019 No Yes I10 Essential (primary) hypertension 03/13/2019 No Yes Z89.422 Acquired absence of other left toe(s) 03/13/2019 No Yes Inactive Problems Resolved Problems Electronic Signature(s) Signed: 07/22/2019 10:02:07 AM By: [name] PA-C Entered By: [name] on 07/22/2019 10:02:07 [name] ([id number]) -------------------------------------------------------------------------------- Progress Note Details Patient Name: [name] Date of Service: 07/22/2019 10:00 AM Medical Record Number: [id number] Patient Account Number: [id number] Date of Birth/Sex: Apr 05, 1965 (54 y.o. M) Treating RN: Cornell Barman Primary Care Provider: [name] Other Clinician: Referring Provider: [name] Treating Provider/Extender: Melburn Hake, Zethan Alfieri Weeks in Treatment: 18 Subjective Chief Complaint Information  obtained from Patient Right lateral foot ulcer History of Present Illness (HPI) This 54 year old male comes with an ulcerated area on the plantar aspect of the right foot which she's had for approximately a month. I have known him from a previous visit at [facility] wound center and was treated in the months of April and May 2016 and rapidly healed a left plantar ulcer with a total contact cast. He has been a diabetic for about 16 years and tries to keep active and is fairly compliant with his diabetes management. 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 Past medical history significant for hypertension, hyperlipidemia, and status post appendectomy 1993. He does not smoke or drink alcohol. 01/14/2015 -- the patient had tolerated his total contact cast very well and had  no problems and has had no systemic symptoms. However when his total contact cast was cut open he had excessive amount of purulent drainage in spite of being on antibiotics. He had had a recent x-ray done in the ER 12/22/2014 which showed IMPRESSION:No evidence of osseous erosion. Known soft tissue ulceration is not well characterized on radiograph. Scattered vascular calcifications seen. his last hemoglobin A1c in December was 7.3. He has been on Augmentin and doxycycline for the last 2 weeks. 01/21/2015 -- his culture grew rare growth of Pantoea species an MR moderate growth of Candida parapsilosis. it is sensitive to levofloxacin. He has not heard back from the insurance company regarding his hyperbaric oxygen therapy. His MRI has not been done yet and we will try and get him an earlier date 01/28/2015 -- MRI was done last night -- IMPRESSION:1. Soft tissue ulcer overlying the plantar aspect of the fifth metatarsal head extending to the cortex. Subcortical marrow edema in the fifth metatarsal head with corresponding T1 hypointensity is concerning for early osteomyelitis of the plantar lateral aspect of the fifth metatarsal  head. Chest x-ray done on 01/14/2015 shows bronchiectatic changes without infiltrate. EKG done on generally 17 2017 shows a normal sinus rhythm and is a normal EKG. 02/04/2015 -- he was asked to see Dr. [name] last week and had 2 appointments but had to cancel both due to pressures of work. Last night he has woken up with severe pain in the foot and leg and it is swollen up. No fever or no change in his blood glucose. Addendum: I spoke with Dr. [name] who kindly agreed to accept the patient for inpatient therapy and have also opened to the hospitalist Dr. [name], and discuss details of the management including PICC line and repeat cultures. 02/12/2015-- -- was seen by Dr. [name] in the hospital and a PICC line was placed. He was to receive Ceftazidime 2 g every 12 hourly, oral levofloxacin 750 mg every 24 hourly and oral fluconazole 200 mg daily. The antibiotics were to be given for 4 weeks except the Diflucan was to be given for the first 2 weeks. Reviewed note from 02/10/2015 -- and Dr. [name] had recommended management for growth of MSSA and Serratia. He switched him from ceftazidime to ceftriaxone 2 g every 24 hours. Levofloxacin was stopped and he would continue on fluconazole for another week. He had asked me to decide whether further imaging was necessary and whether surgical debridement of the infected bone was needed. He is doing well and has been off work for this week and we will keep him off the next week. 02/22/2015 -- he was seen by my colleague on 01/19/2015 and at that time an incision and drainage was done on his right lateral forefoot on the dorsum. Today when I probed this wound it is frankly draining pus and it communicates with the ulcer on the plantar aspect of his right foot. The patient is still on IV ceftriaxone 2 g every 24 hours and is to be seen by Dr. [name] on Friday. 03/19/2015 -- On 03/04/2015 I spoke to Dr. Celesta Gentile who saw him in the office  today and did an x-ray of his right foot and noted that there was osteomyelitis of the right fifth toe and metatarsal and a lot of pus draining from the wound. He recommended operative debridement which would probably result in the fifth metatarsal head and toe amputation.The patient would be referred back to [place] once he was done with surgery. He  was admitted to [facility] yesterday and had surgery done by podiatry for a right fifth metatarsal acute osteomyelitis with cellulitis and abscess. He had a right foot incision and drainage with fifth metatarsal partial amputation and removal of toe infected bone and soft tissue with cultures. The wound was partially closed and packing of the distal end was done. Patient was already on cefepime 2 g IV every 8 hourly and put on vancomycin pending final cultures. He had grown moderate gram-negative rods, and later found to be rare diphtheroids. I received a call from Dr. Cannon Kettle the podiatrist and we discussed the above. On 03/10/2015 he was found positive for influenza a and has been put on Tamiflu. Since his discharge he has been seen by Dr. Cannon Kettle who is planning to remove his sutures this coming week. He was reviewed by Dr. [name] on 03/17/2015 and his Diflucan was stopped and vancomycin. After the 3 doses he is taking. He is going to change Ceftazidime to Zosyn 3.375 g IV every 8 hours. 03/25/2015 -- he was seen by the podiatrist a couple of days ago and the sutures were removed. She will follow back with him in 4 weeks' time and at that time x-rays will be taken and a custom molded insert would be made for his shoe. 04/01/2015 -- he was seen by Dr. [name] on 03/29/2015 who pulled the PICC line stopped his IV antibiotics and recommended starting doxycycline and levofloxacin for 2 weeks. He also stop the fluconazole. The patient will follow up with him only when necessary. [name], [name] ([id number]) Readmission: 03/15/17 on  evaluation today patient presents for initial evaluation concerning the new issues although he has previously been evaluated in our clinic. Unfortunately the previous evaluation led to the patient having to proceed to amputation and so he is somewhat nervous about being here today. With that being said he has a very slight blister that occurred on the plantar aspect more medial on the right great toe that has been present for just a very short amount of time, several days. With that being said he felt like initially when he called that he somewhat overreacted but due to the fact that his previous issue led to amputation he is very cautious these days I explained that he did the right thing. With that being said he has been tolerating the dressing changes without complication mainly he just been covering this. He does not have any discomfort in secondary to neuropathy it's unlikely that he feels much. With that being said he does tell me that the issue that he had here is that he went barefoot when he knows he should not have which subsequently led to the blisters. He states is definitely not doing that anymore. His most recent hemoglobin A1c was December and registered 6.9 his ABI today was 1.1. 03/27/17 on evaluation today patient appears to be doing great in regard to his right great toe ulcer. He has been tolerating the dressing changes without complication. The good news is this is making excellent progress and he seems to be caring for this in an excellent fashion. I see no evidence of breakdown that would make me concerned that he was at risk for infection/amputation. This has obviously been his concern due to the fifth toe amputation that was necessitated previous when he had a similar issue. Nonetheless this seems to be progressing much more nicely. Readmission: 03/13/2019 upon evaluation today patient presents for reevaluation here in the clinic concerning issues he  has been having with his  right foot on the lateral portion at the proximal end of the metatarsal. Subsequently he tells me that this just opened up in the past couple of days or so and the erythema really began about 24 to 48 hours ago. Fortunately there is no signs of systemic infection. 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 That left him with this bony prominence that is where the wound is currently. There is erythema surrounding that does have me concerned about cellulitis. With that being said he was noncompressible as far as ABIs are concerned I do think we can need to check into arterial studies at this point. Patient does have a history of hypertension along with diabetes mellitus type 2. He also tends to develop quite a bit of callus white often. 03/20/2019 upon evaluation today patient appears to be doing very well in regard to his wounds currently. He has been tolerating the dressing changes without complication. Fortunately there is no signs of infection. His ABIs were good at this point and registered at 1.07 on the left and 1.09 on the right. In regard to the x-ray this was negative as well for any signs of osteomyelitis. The patient also seems to be doing better in regard to the infection. He still has several days of the antibiotic left at this point best the Bactrim DS and he seems to be doing very well with this. Overall I am extremely pleased with the progress in 1 week's time he is going require some sharp debridement today however. 03/27/2019 upon evaluation today patient actually appears to be making some progress in regard to his wound. It is a little deeper but measuring smaller which is good news due to the fact that again were clear away some of the necrotic tissue which is why the depth is increasing a little bit. Nonetheless after like were getting very close to being down at a good wound bed. Fortunately there is no signs of active infection at this  time. There is a little bit of erythema immediately surrounding the wound that we do want to be very cognizant of and careful about. For that reason I am going to go ahead and extend his antibiotic today for an additional 10 days that is the Bactrim. 04/03/2019 upon evaluation today patient appears to be making some progress. I do feel like the Annitta Needs is doing a good job along with the alginate is being packed into the wound space behind. He has been tolerating the dressing changes without complication. Fortunately there is no signs of active infection at this time. No fevers, chills, nausea, vomiting, or diarrhea. 04/10/2019 upon evaluation today patient appears to be making progress. The wound is not quite as deep but he still does not have a great wound surface yet for working on this and the Santyl does seem to be cleaning things up. Fortunately there is no evidence of active infection at this time. No fevers, chills, nausea, vomiting, or diarrhea. 04/17/2019 upon evaluation today patient appears to be doing excellent at this time in regard to his foot ulcer. I do believe the Santyl with the alginate packed in behind has been of benefit for him and I am very pleased in this regard. With that being said I am feeling like the wound surface is improving quite a bit each time I see him still I do not believe we are at the point of stating that  the wound bed is perfect but again were making progress. The patient is going require some sharp debridement today. 4/22; this is a patient who is using Santyl with calcium alginate. Apparently his wound dimensions have been improving. Patient is changing the dressing himself. He has a surgical shoe. He works in a sitting position therefore is not on his feet all that much. There is undermining 05/01/2019 upon evaluation today patient appears to be doing a little better in regard to the size of his wound though he still has some depth. This does seem to be much  cleaner has been using Santyl on the base of the wound followed by packing with silver alginate and behind. Nonetheless I do believe that based on what we are seeing we may need to switch to a collagen type dressing endoform may actually be excellent for him. 05/08/2019 upon evaluation today patient's wound actually appears to be showing better granulation tissue in the base of the wound. With that being said he does have some epiboly noted around the edges of the wound especially plantar and more distal. Subsequently this is can require sharp debridement today. Fortunately there is no signs of active systemic infection though I do feel like there is some local cellulitis noted at this point. The patient also notes has had increased drainage. 5/13; wound volume quite a bit improved this week down to 3 mm. Patient states pain and drainage better. Culture from last week grew staph aureus [not MRSA] and this is sensitive to quinolones and he is on Levaquin. HOWEVER he also grew abundant Enterococcus faecalis treatment of choice for this is ampicillin. Quinolone coverage is unreliable 5/20 completing the Augmentin. Small wound on the right lateral foot. Thick rolled edges around the wound. Using silver alginate 05/29/2019 upon evaluation today patient appears to be doing better compared to last time I saw him. Fortunately there is no signs of active infection and I do feel like he is actually making good progress. Overall the quality of the wound bed along with the size looks improved and the inflammation/erythema that was previously noted by myself when I saw the couple weeks back actually has also resolved and this is great. Overall very pleased with how things stand. 06/06/2019 upon evaluation today patient actually appears to be doing quite well with regard to his foot ulcer with regard to some of the granulation were seen at this point. There does not appear to be any evidence of systemic or local  infection although he still has discomfort I feel like things are moving in a slow but surely correct direction. I think we may need to help fill some of the space however even though he is using the collagen we may need to have something behind this such as a plain packing strip to try to help hold the collagen in the base of the wound. 06/12/2019 upon evaluation today patient appears to be doing well with regard to his foot ulcer. This seems to be making some slow but steady progress. The wound does not appear to be as deep today compared to where it was previous. Fortunately there is no signs of active infection at [name], [name]. ([id number]) this time. No fevers, chills, nausea, vomiting, or diarrhea. 06/19/2019 on evaluation today patient's wound does not appear to be doing quite as well as what I would like to see. Fortunately there is no signs of systemic infection at this time which is great news. With that being said I do  believe that the patient needs to have a wound culture as I do believe he likely has a local infection that needs to be addressed. He is also can require some debridement today as well. 06/26/2019 upon evaluation today patient's wound actually appears to be showing signs of excellent improvement today. There does not appear to be any evidence of active infection which is great news. There does not appear to be significant erythema at this point. The patient does have improvement overall in the appearance of the infection he has been taking the Augmentin the culture came back showing no organism was grown at this point but nonetheless I do believe he has been doing well with the antibiotics I would continue that at this point. 07/03/2019 upon evaluation today patient appears to be doing about the same in regard to his wound. There does not appear to be any evidence of active infection and overall I am pleased with where things stand. The patient is tolerating the dressing  changes without complication but it does not appear that they are packing the wound at this time which I think is going to slow down his healing prospects. 07/10/2019 upon evaluation today patient appears to be doing little better in my opinion in regard to the overall moisture collected in the base of the wound. They have been packing this with a little bit of silver alginate and that has done quite well. Overall I am pleased in that regard. Subsequently the redness does not appear to be any worse today I did actually go ahead and marked this since this can be 2 weeks between now and when I see him again I would make sure that if anything changes or worsens he will be able to notice this and go ahead and initiate treatment with the Augmentin. 07/22/2019 patient comes in today a little bit early for evaluation secondary to the fact that he was having issues with increased drainage and concern about infection. With that being said he does appear to have developed a blister area again proximal to where the original wound opening was in although the wound bed itself actually appears to be doing somewhat better he has had increased drainage and tracking of fluid here due to this blister. Nonetheless I believe that that does need to be cleaned away today but again I am not really certain that I see a lot of evidence for infection at this point. Objective Constitutional Well-nourished and well-hydrated in no acute distress. Vitals Time Taken: 9:53 AM, Height: 74 in, Weight: 308 lbs, BMI: 39.5, Temperature: 98.8 F, Pulse: 83 bpm, Respiratory Rate: 18 breaths/min, Blood Pressure: 158/64 mmHg. Respiratory normal breathing without difficulty. Psychiatric this patient is able to make decisions and demonstrates good insight into disease process. Alert and Oriented x 3. pleasant and cooperative. General Notes: Upon inspection patient's wound bed did require sharp debridement I used a 5 mm scalpel in order to  clear away the callus as well as necrotic debris from the surface of the wound post debridement wound bed actually appears to be doing much better. I think the main issue here comes down to friction and pressure getting to the area despite use of the offloading shoe I do not believe this is enough. I believe that he likely would benefit more total contact cast but being this is the right foot I do need a agreement from him to not drive with this cast on. Obviously since he drove himself today this would have to be  for next week. Integumentary (Hair, Skin) Wound #4 status is Open. Original cause of wound was Gradually Appeared. The wound is located on the Right,Lateral Foot. The wound measures 0.5cm length x 0.7cm width x 1cm depth; 0.275cm^2 area and 0.275cm^3 volume. There is Fat Layer (Subcutaneous Tissue) Exposed exposed. There is a medium amount of serous drainage noted. The wound margin is flat and intact. There is medium (34-66%) pink granulation within the wound bed. There is a medium (34-66%) amount of necrotic tissue within the wound bed including Adherent Slough. Assessment Active Problems ICD-10 Type 2 diabetes mellitus with foot ulcer 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d Cellulitis of right lower limb Corns and callosities Essential (primary) hypertension Acquired absence of other left toe(s) [name], [name]. ([id number]) Procedures Wound #4 Pre-procedure diagnosis of Wound #4 is a Diabetic Wound/Ulcer of the Lower Extremity located on the Right,Lateral Foot .Severity of Tissue Pre Debridement is: Fat layer exposed. There was a Excisional Skin/Subcutaneous Tissue Debridement with a total area of 0.35 sq cm performed by STONE III, [place] Nase E., PA-C. With the following instrument(s): Curette to remove Viable and Non-Viable tissue/material. Material removed includes Callus, Subcutaneous Tissue, and Slough after achieving pain control using  Lidocaine. No specimens were taken. A time out was conducted at 10:10, prior to the start of the procedure. A Minimum amount of bleeding was controlled with Pressure. The procedure was tolerated well. Post Debridement Measurements: 1.8cm length x 1.8cm width x 1.2cm depth; 3.054cm^3 volume. Character of Wound/Ulcer Post Debridement is stable. Severity of Tissue Post Debridement is: Fat layer exposed. Post procedure Diagnosis Wound #4: Same as Pre-Procedure Plan Wound Cleansing: Wound #4 Right,Lateral Foot: Clean wound with Normal Saline. - in office Dial antibacterial soap, wash wounds, rinse and pat dry prior to dressing wounds Anesthetic (add to Medication List): Wound #4 Right,Lateral Foot: Topical Lidocaine 4% cream applied to wound bed prior to debridement (In Clinic Only). Primary Wound Dressing: Wound #4 Right,Lateral Foot: Silver Alginate - Felt for off-loading Secondary Dressing: Wound #4 Right,Lateral Foot: Gauze and Kerlix/Conform Dressing Change Frequency: Wound #4 Right,Lateral Foot: Change dressing every other day. Follow-up Appointments: Wound #4 Right,Lateral Foot: Return Appointment in 1 week. Edema Control: Wound #4 Right,Lateral Foot: Patient to wear own compression stockings - wear daily Off-Loading: Wound #4 Right,Lateral Foot: Open toe surgical shoe with peg assist. 1. I am going to suggest currently that we go ahead and initiate treatment with a continuation of the silver alginate dressing as that seems to be doing well for the patient. 2. I am also can recommend that we actually use felt offloading to try to keep some pressure off of the wound location to see if this will be of benefit for him. 3. I did recommend that he strongly consider the total contact cast for next week that he would need to have someone drive him if were going to do that. He cannot drive in this and I made that explicitly clear that I would need a commitment from him that he will not  drive if he is going to utilize the cast. This can be dangerous both to himself as well as others. I do believe however this is good to be the best way to get him healed. We will see patient back for reevaluation in 1 week here in the clinic. If anything worsens or changes patient will contact our office for additional recommendations. Electronic Signature(s) Signed: 07/22/2019 11:16:36 AM By: [name] PA-C  Entered By: [name] on 07/22/2019 11:16:36 [name] ([id number]) -------------------------------------------------------------------------------- SuperBill Details Patient Name: [name] Date of Service: 07/22/2019 Medical Record Number: [id number] Patient Account Number: [id number] Date of Birth/Sex: [birth year] (54 y.o. M) Treating RN: Cornell Barman Primary Care Provider: [name] Other Clinician: Referring Provider: [name] Treating Provider/Extender: Melburn Hake, Talitha Dicarlo Weeks in Treatment: 18 Diagnosis Coding ICD-10 Codes Code Description E11.621 Type 2 diabetes mellitus with foot ulcer L97.512 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d L03.115 Cellulitis of right lower limb L84 Corns and callosities I10 Essential (primary) hypertension Z89.422 Acquired absence of other left toe(s) Facility Procedures CPT4 Code: [id number] Description: 59163 - DEB SUBQ TISSUE 20 SQ CM/< Modifier: Quantity: 1 CPT4 Code: Description: ICD-10 Diagnosis Description L97.512 Non-pressure chronic ulcer of other part of right foot with fat layer ex Modifier: posed Quantity: Physician Procedures CPT4 Code: 8466599 Description: 11042 - WC PHYS SUBQ TISS 20 SQ CM Modifier: Quantity: 1 CPT4 Code: Description: ICD-10 Diagnosis Description L97.512 Non-pressure chronic ulcer of other part of right foot with fat layer ex Modifier: posed Quantity: Electronic Signature(s) Signed: 07/22/2019 11:16:48 AM By: [name]  PA-C Entered By: [name] on 07/22/2019 11:16:47

## 2019-07-24 ENCOUNTER — Ambulatory Visit: Payer: Managed Care, Other (non HMO) | Admitting: Physician Assistant

## 2019-07-25 NOTE — Progress Notes (Signed)
[name] ([id number]) Visit Report for 07/22/2019 Arrival Information Details Patient Name: [name], [name]. Date of Service: 07/22/2019 10:00 AM Medical Record Number: [id number] Patient Account Number: [id number] Date of Birth/Sex: [birth year] (54 y.o. M) Treating RN: Cornell Barman Primary Care Tiwanna Tuch: [name] Other Clinician: Referring Lorrena Goranson: [name] Treating Jamala Kohen/Extender: Melburn Hake, HOYT Weeks in Treatment: 18 Visit Information History Since Last Visit Added or deleted any medications: No Patient Arrived: Ambulatory Any new allergies or adverse reactions: No Arrival Time: 09:49 Had a fall or experienced change in No Accompanied By: self activities of daily living that may affect Transfer Assistance: None risk of falls: Patient Identification Verified: Yes Signs or symptoms of abuse/neglect since last visito No Secondary Verification Process Completed: Yes Hospitalized since last visit: No Patient Has Alerts: Yes Implantable device outside of the clinic excluding No Patient Alerts: DMII cellular tissue based products placed in the center since last visit: Has Dressing in Place as Prescribed: Yes Pain Present Now: Yes Electronic Signature(s) Signed: 07/22/2019 10:16:19 AM By: Sandre Kitty Entered By: Sandre Kitty on 07/22/2019 09:52:58 [name] ([id number]) -------------------------------------------------------------------------------- Encounter Discharge Information Details Patient Name: [name] Date of Service: 07/22/2019 10:00 AM Medical Record Number: [id number] Patient Account Number: [id number] Date of Birth/Sex: [birth year] (54 y.o. M) Treating RN: Grover Canavan Primary Care Damyon Mullane: [name] Other Clinician: Referring Cotina Freedman: [name] Treating Robley Matassa/Extender: Melburn Hake, HOYT Weeks in Treatment: 18 Encounter Discharge Information Items Post Procedure Vitals Discharge  Condition: Stable Temperature (F): 98.8 Ambulatory Status: Ambulatory Pulse (bpm): 83 Discharge Destination: Home Respiratory Rate (breaths/min): 18 Transportation: Private Auto Blood Pressure (mmHg): 158/64 Accompanied By: self Schedule Follow-up Appointment: Yes Clinical Summary of Care: Electronic Signature(s) Signed: 07/22/2019 4:19:39 PM By: Grover Canavan Entered By: Grover Canavan on 07/22/2019 10:31:32 [name] ([id number]) -------------------------------------------------------------------------------- Lower Extremity Assessment Details Patient Name: [name] Date of Service: 07/22/2019 10:00 AM Medical Record Number: [id number] Patient Account Number: [id number] Date of Birth/Sex: 12/22/1965 (54 y.o. M) Treating RN: Cornell Barman Primary Care Eathan Groman: [name] Other Clinician: Referring Caldwell Kronenberger: [name] Treating Kiela Shisler/Extender: Sharalyn Ink in Treatment: 18 Vascular Assessment Pulses: Dorsalis Pedis Palpable: [Right:Yes] Electronic Signature(s) Signed: 07/24/2019 6:25:29 PM By: Gretta Cool, BSN, RN, CWS, Kim RN, BSN Entered By: Gretta Cool, BSN, RN, CWS, Kim on 07/22/2019 10:09:04 [name] ([id number]) -------------------------------------------------------------------------------- Multi Wound Chart Details Patient Name: [name]. Date of Service: 07/22/2019 10:00 AM Medical Record Number: [id number] Patient Account Number: [id number] Date of Birth/Sex: 04/10/1965 (54 y.o. M) Treating RN: Cornell Barman Primary Care Vedansh Kerstetter: [name] Other Clinician: Referring Xandra Laramee: [name] Treating Latanga Nedrow/Extender: Melburn Hake, HOYT Weeks in Treatment: 18 Vital Signs Height(in): 74 Pulse(bpm): 83 Weight(lbs): 308 Blood Pressure(mmHg): 158/64 Body Mass Index(BMI): 40 Temperature(F): 98.8 Respiratory Rate(breaths/min): 18 Photos: [N/A:N/A] Wound Location: Right, Lateral Foot N/A  N/A Wounding Event: Gradually Appeared N/A N/A Primary Etiology: Diabetic Wound/Ulcer of the Lower N/A N/A Extremity Comorbid History: Hypertension, Type II Diabetes, N/A N/A Neuropathy Date Acquired: 03/12/2019 N/A N/A Weeks of Treatment: 18 N/A N/A Wound Status: Open N/A N/A Pending Amputation on Yes N/A N/A Presentation: Measurements L x W x D (cm) 0.5x0.7x1 N/A N/A Area (cm) : 0.275 N/A N/A Volume (cm) : 0.275 N/A N/A % Reduction in Area: 56.80% N/A N/A % Reduction in Volume: -116.50% N/A N/A Classification: Grade 1 N/A N/A Exudate Amount: Medium N/A N/A Exudate Type: Serous N/A N/A Exudate Color: amber N/A N/A Wound Margin: Flat and Intact N/A N/A Granulation Amount: Medium (34-66%)  N/A N/A Granulation Quality: Pink N/A N/A Necrotic Amount: Medium (34-66%) N/A N/A Exposed Structures: Fat Layer (Subcutaneous Tissue) N/A N/A Exposed: Yes Fascia: No Tendon: No Muscle: No Joint: No Bone: No Epithelialization: None N/A N/A Treatment Notes Electronic Signature(s) Signed: 07/24/2019 6:25:29 PM By: Gretta Cool, BSN, RN, CWS, Kim RN, BSN Entered By: Gretta Cool, BSN, RN, CWS, Kim on 07/22/2019 10:09:38 [name] ([name] ([id number]) -------------------------------------------------------------------------------- [place] Details Patient Name: [name]. Date of Service: 07/22/2019 10:00 AM Medical Record Number: [id number] Patient Account Number: [id number] Date of Birth/Sex: 11-23-1965 (54 y.o. M) Treating RN: Cornell Barman Primary Care Ryn Peine: [name] Other Clinician: Referring Haydon Kalmar: [name] Treating Sameen Leas/Extender: Melburn Hake, HOYT Weeks in Treatment: 18 Active Inactive Nutrition Nursing Diagnoses: Impaired glucose control: actual or potential Goals: Patient/caregiver verbalizes understanding of need to maintain therapeutic glucose control per primary care physician Date Initiated:  03/13/2019 Target Resolution Date: 04/11/2019 Goal Status: Active Interventions: Assess patient nutrition upon admission and as needed per policy Notes: Orientation to the Wound Care Program Nursing Diagnoses: Knowledge deficit related to the wound healing center program Goals: Patient/caregiver will verbalize understanding of the [place] Date Initiated: 03/13/2019 Target Resolution Date: 04/11/2019 Goal Status: Active Interventions: Provide education on orientation to the wound center Notes: Wound/Skin Impairment Nursing Diagnoses: Impaired tissue integrity Goals: Ulcer/skin breakdown will have a volume reduction of 30% by week 4 Date Initiated: 03/13/2019 Target Resolution Date: 04/11/2019 Goal Status: Active Interventions: Assess ulceration(s) every visit Notes: Electronic Signature(s) Signed: 07/24/2019 6:25:29 PM By: Gretta Cool, BSN, RN, CWS, Kim RN, BSN Entered By: Gretta Cool, BSN, RN, CWS, Kim on 07/22/2019 10:09:28 [name] ([id number]) -------------------------------------------------------------------------------- Pain Assessment Details Patient Name: [name] Date of Service: 07/22/2019 10:00 AM Medical Record Number: [id number] Patient Account Number: [id number] Date of Birth/Sex: 09-07-1965 (54 y.o. M) Treating RN: Cornell Barman Primary Care Lamond Glantz: [name] Other Clinician: Referring Zephyr Ridley: [name] Treating Mardell Suttles/Extender: Melburn Hake, HOYT Weeks in Treatment: 18 Active Problems Location of Pain Severity and Description of Pain Patient Has Paino Yes Site Locations Rate the pain. Current Pain Level: 3 Pain Management and Medication Current Pain Management: Electronic Signature(s) Signed: 07/22/2019 10:16:19 AM By: Sandre Kitty Signed: 07/24/2019 6:25:29 PM By: Gretta Cool, BSN, RN, CWS, Kim RN, BSN Entered By: Sandre Kitty on 07/22/2019 09:55:29 [name]  ([id number]) -------------------------------------------------------------------------------- Patient/Caregiver Education Details Patient Name: JADIN, CREQUE Date of Service: 07/22/2019 10:00 AM Medical Record Number: [id number] Patient Account Number: [id number] Date of Birth/Gender: [birth year] (54 y.o. M) Treating RN: Cornell Barman Primary Care Physician: [name] Other Clinician: Referring Physician: [name] Treating Physician/Extender: Sharalyn Ink in Treatment: 18 Education Assessment Education Provided To: Patient Education Topics Provided Offloading: Handouts: What is Offloadingo, Other: Felt, Cast next weeko Methods: Demonstration, Explain/Verbal Responses: State content correctly Wound/Skin Impairment: Handouts: Caring for Your Ulcer Methods: Demonstration, Explain/Verbal Responses: State content correctly Electronic Signature(s) Signed: 07/24/2019 6:25:29 PM By: Gretta Cool, BSN, RN, CWS, Kim RN, BSN Entered By: Gretta Cool, BSN, RN, CWS, Kim on 07/22/2019 10:18:39 [name] ([id number]) -------------------------------------------------------------------------------- Wound Assessment Details Patient Name: MANLEY, FASON. Date of Service: 07/22/2019 10:00 AM Medical Record Number: [id number] Patient Account Number: [id number] Date of Birth/Sex: [birth year] (54 y.o. M) Treating RN: Cornell Barman Primary Care Legacy Lacivita: [name] Other Clinician: Referring Jaylani Mcguinn: [name] Treating Ayari Liwanag/Extender: Melburn Hake, HOYT Weeks in Treatment: 18 Wound Status Wound Number: 4 Primary Etiology: Diabetic Wound/Ulcer of the Lower Extremity Wound Location: Right, Lateral Foot Wound Status:  Open Wounding Event: Gradually Appeared Comorbid History: Hypertension, Type II Diabetes, Neuropathy Date Acquired: 03/12/2019 Weeks Of Treatment: 18 Clustered Wound: No Pending Amputation On Presentation Photos Wound Measurements Length: (cm)  0.5 Width: (cm) 0.7 Depth: (cm) 1 Area: (cm) 0.275 Volume: (cm) 0.275 % Reduction in Area: 56.8% % Reduction in Volume: -116.5% Epithelialization: None Wound Description Classification: Grade 1 Foul Wound Margin: Flat and Intact Slou Exudate Amount: Medium Exudate Type: Serous Exudate Color: amber Odor After Cleansing: No gh/Fibrino Yes Wound Bed Granulation Amount: Medium (34-66%) Exposed Structure Granulation Quality: Pink Fascia Exposed: No Necrotic Amount: Medium (34-66%) Fat Layer (Subcutaneous Tissue) Exposed: Yes Necrotic Quality: Adherent Slough Tendon Exposed: No Muscle Exposed: No Joint Exposed: No Bone Exposed: No Treatment Notes Wound #4 (Right, Lateral Foot) Notes scell, gauze, conform, offloading foam WILLIARD, KELLER ([id number]) Electronic Signature(s) Signed: 07/22/2019 10:16:19 AM By: Sandre Kitty Signed: 07/24/2019 6:25:29 PM By: Gretta Cool, BSN, RN, CWS, Kim RN, BSN Entered By: Sandre Kitty on 07/22/2019 10:02:04 [name] ([id number]) -------------------------------------------------------------------------------- Vitals Details Patient Name: [name] Date of Service: 07/22/2019 10:00 AM Medical Record Number: [id number] Patient Account Number: [id number] Date of Birth/Sex: Feb 13, 1965 (54 y.o. M) Treating RN: Cornell Barman Primary Care Paetyn Pietrzak: [name] Other Clinician: Referring Monterio Bob: [name] Treating Jema Deegan/Extender: Melburn Hake, HOYT Weeks in Treatment: 18 Vital Signs Time Taken: 09:53 Temperature (F): 98.8 Height (in): 74 Pulse (bpm): 83 Weight (lbs): 308 Respiratory Rate (breaths/min): 18 Body Mass Index (BMI): 39.5 Blood Pressure (mmHg): 158/64 Reference Range: 80 - 120 mg / dl Electronic Signature(s) Signed: 07/22/2019 10:16:19 AM By: Sandre Kitty Entered By: Sandre Kitty on 07/22/2019 09:55:23

## 2019-07-29 ENCOUNTER — Encounter: Payer: Managed Care, Other (non HMO) | Admitting: Physician Assistant

## 2019-07-29 ENCOUNTER — Encounter: Payer: Self-pay | Admitting: Neurology

## 2019-07-29 ENCOUNTER — Other Ambulatory Visit: Payer: Self-pay

## 2019-07-29 DIAGNOSIS — E11621 Type 2 diabetes mellitus with foot ulcer: Secondary | ICD-10-CM | POA: Diagnosis not present

## 2019-07-29 NOTE — Progress Notes (Signed)
[name], [name] ([id number]) Visit Report for 07/29/2019 Arrival Information Details Patient Name: [name], [name]. Date of Service: 07/29/2019 8:00 AM Medical Record Number: [id number] Patient Account Number: [id number] Date of Birth/Sex: 1965-03-10 (54 y.o. M) Treating [name]: [name] Primary Care [name]: [name] Other Clinician: Referring [name]: [name] Treating [name]/Extender: [name], [name] Weeks in Treatment: 52 Visit Information History Since Last Visit All ordered tests and consults were completed: No Patient Arrived: Ambulatory Added or deleted any medications: No Arrival Time: 08:20 Any new allergies or adverse reactions: No Accompanied By: self Had a fall or experienced change in No Transfer Assistance: None activities of daily living that may affect Patient Identification Verified: Yes risk of falls: Secondary Verification Process Completed: Yes Signs or symptoms of abuse/neglect since last visito No Patient Requires Transmission-Based Precautions: No Hospitalized since last visit: No Patient Has Alerts: Yes Implantable device outside of the clinic excluding No Patient Alerts: DMII cellular tissue based products placed in the center since last visit: Has Dressing in Place as Prescribed: Yes Has Compression in Place as Prescribed: No Has Footwear/Offloading in Place as Prescribed: Yes Right: Wedge Shoe Pain Present Now: No Electronic Signature(s) Signed: 07/29/2019 9:37:01 AM By: [name] Entered By: [name] on 07/29/2019 08:22:30 [name] ([id number]) -------------------------------------------------------------------------------- Encounter Discharge Information Details Patient Name: [name] Date of Service: 07/29/2019 8:00 AM Medical Record Number: [id number] Patient Account Number: [id number] Date of Birth/Sex: [birth year] (54 y.o. M) Treating [name]: [name] Primary Care  Madline Oesterling: [name] Other Clinician: Referring [name]: [name] Treating [name]/Extender: [name], [name] Weeks in Treatment: 108 Encounter Discharge Information Items Post Procedure Vitals Discharge Condition: Stable Temperature (F): 98.2 Ambulatory Status: Ambulatory Pulse (bpm): 82 Discharge Destination: Home Respiratory Rate (breaths/min): 18 Transportation: Other Blood Pressure (mmHg): 147/68 Accompanied By: self Schedule Follow-up Appointment: Yes Clinical Summary of Care: Electronic Signature(s) Signed: 07/29/2019 9:53:36 AM By: [name] Entered By: [name] on 07/29/2019 08:44:18 [name] ([id number]) -------------------------------------------------------------------------------- Lower Extremity Assessment Details Patient Name: [name] Date of Service: 07/29/2019 8:00 AM Medical Record Number: [id number] Patient Account Number: [id number] Date of Birth/Sex: 1965/11/06 (54 y.o. M) Treating [name]: [name] Primary Care Nahshon Reich: [name] Other Clinician: Referring [name]: [name] Treating [name]/Extender: [name], [name] Weeks in Treatment: 19 Edema Assessment Assessed: [Left: No] [Right: Yes] Edema: [Left: N] [Right: o] Calf Left: Right: Point of Measurement: cm From Medial Instep cm 41.2 cm Ankle Left: Right: Point of Measurement: 26.5 cm From Medial Instep cm 26.5 cm Vascular Assessment Pulses: Dorsalis Pedis Palpable: [Right:Yes] Electronic Signature(s) Signed: 07/29/2019 9:37:01 AM By: [name] Signed: 07/29/2019 3:58:26 PM By: [name], BSN, [name], CWS, [name], BSN Entered By: [name] on 07/29/2019 08:26:09 [name] ([id number]) -------------------------------------------------------------------------------- Multi Wound Chart Details Patient Name: [name] Date of Service: 07/29/2019 8:00 AM Medical Record Number: [id number] Patient Account  Number: [id number] Date of Birth/Sex: Sep 12, 1965 (54 y.o. M) Treating [name]: [name] Primary Care [name]: [name] Other Clinician: Referring [name]: [name] Treating [name]/Extender: [name], [name] Weeks in Treatment: 19 Vital Signs Height(in): 74 Pulse(bpm): 38 Weight(lbs): 308 Blood Pressure(mmHg): 147/68 Body Mass Index(BMI): 40 Temperature(F): 98.2 Respiratory Rate(breaths/min): 18 Photos: [N/A:N/A] Wound Location: Right, Lateral Foot N/A N/A Wounding Event: Gradually Appeared N/A N/A Primary Etiology: Diabetic Wound/Ulcer of the Lower N/A N/A Extremity Comorbid History: Hypertension, Type II Diabetes, N/A N/A Neuropathy Date Acquired: 03/12/2019 N/A N/A Weeks of Treatment: 19 N/A N/A Wound Status: Open N/A N/A Pending Amputation  on Yes N/A N/A Presentation: Measurements L x W x D (cm) 0.5x0.3x0.9 N/A N/A Area (cm) : 0.118 N/A N/A Volume (cm) : 0.106 N/A N/A % Reduction in Area: 81.40% N/A N/A % Reduction in Volume: 16.50% N/A N/A Classification: Grade 1 N/A N/A Exudate Amount: Medium N/A N/A Exudate Type: Serous N/A N/A Exudate Color: amber N/A N/A Wound Margin: Flat and Intact N/A N/A Granulation Amount: Medium (34-66%) N/A N/A Granulation Quality: Pink N/A N/A Necrotic Amount: Medium (34-66%) N/A N/A Exposed Structures: Fat Layer (Subcutaneous Tissue) N/A N/A Exposed: Yes Fascia: No Tendon: No Muscle: No Joint: No Bone: No Epithelialization: None N/A N/A Treatment Notes Electronic Signature(s) Signed: 07/29/2019 3:58:26 PM By: [name], BSN, [name], CWS, [name], BSN Entered By: [name], BSN, [name], CWS, [name] on 07/29/2019 08:34:37 [name] ([name] ([id number]) -------------------------------------------------------------------------------- [place] Details Patient Name: [name], [name]. Date of Service: 07/29/2019 8:00 AM Medical Record Number: [id number] Patient Account Number:  [id number] Date of Birth/Sex: [birth year] (54 y.o. M) Treating [name]: [name] Primary Care [name]: [name] Other Clinician: Referring [name]: [name] Treating [name]/Extender: [name], [name] Weeks in Treatment: 19 Active Inactive Nutrition Nursing Diagnoses: Impaired glucose control: actual or potential Goals: Patient/caregiver verbalizes understanding of need to maintain therapeutic glucose control per primary care physician Date Initiated: 03/13/2019 Target Resolution Date: 04/11/2019 Goal Status: Active Interventions: Assess patient nutrition upon admission and as needed per policy Notes: Orientation to the Wound Care Program Nursing Diagnoses: Knowledge deficit related to the wound healing center program Goals: Patient/caregiver will verbalize understanding of the [place] Date Initiated: 03/13/2019 Target Resolution Date: 04/11/2019 Goal Status: Active Interventions: Provide education on orientation to the wound center Notes: Wound/Skin Impairment Nursing Diagnoses: Impaired tissue integrity Goals: Ulcer/skin breakdown will have a volume reduction of 30% by week 4 Date Initiated: 03/13/2019 Target Resolution Date: 04/11/2019 Goal Status: Active Interventions: Assess ulceration(s) every visit Notes: Electronic Signature(s) Signed: 07/29/2019 3:58:26 PM By: [name], BSN, [name], CWS, [name], BSN Entered By: [name], BSN, [name], CWS, [name] on 07/29/2019 08:34:19 [name] ([id number]) -------------------------------------------------------------------------------- Pain Assessment Details Patient Name: [name] Date of Service: 07/29/2019 8:00 AM Medical Record Number: [id number] Patient Account Number: [id number] Date of Birth/Sex: 1965/12/08 (54 y.o. M) Treating [name]: [name] Primary Care Akon Reinoso: [name] Other Clinician: Referring Patterson Hollenbaugh: [name] Treating Rocio Wolak/Extender: [name], [name] Weeks  in Treatment: 19 Active Problems Location of Pain Severity and Description of Pain Patient Has Paino No Site Locations With Dressing Change: No Pain Management and Medication Current Pain Management: Electronic Signature(s) Signed: 07/29/2019 9:37:01 AM By: [name] Signed: 07/29/2019 3:58:26 PM By: [name], BSN, [name], CWS, [name], BSN Entered By: [name] on 07/29/2019 08:23:37 [name] ([id number]) -------------------------------------------------------------------------------- Patient/Caregiver Education Details Patient Name: [name] Date of Service: 07/29/2019 8:00 AM Medical Record Number: [id number] Patient Account Number: [id number] Date of Birth/Gender: 09/11/1965 (54 y.o. M) Treating [name]: [name] Primary Care Physician: [name] Other Clinician: Referring Physician: [name] Treating Physician/Extender: Sharalyn Ink in Treatment: 42 Education Assessment Education Provided To: Patient Education Topics Provided Wound/Skin Impairment: Handouts: Caring for Your Ulcer Methods: Explain/Verbal Responses: State content correctly Electronic Signature(s) Signed: 07/29/2019 3:58:26 PM By: [name], BSN, [name], CWS, [name], BSN Entered By: [name], BSN, [name], CWS, [name] on 07/29/2019 08:40:24 [name] ([id number]) -------------------------------------------------------------------------------- Wound Assessment Details Patient Name: [name], [name]. Date of Service: 07/29/2019 8:00 AM Medical Record Number: [id number] Patient Account Number: [id number]  Date of Birth/Sex: 1965-04-13 (54 y.o. M) Treating [name]: [name] Primary Care Akita Maxim: [name] Other Clinician: Referring Brent Taillon: [name] Treating Janele Lague/Extender: [name], [name] Weeks in Treatment: 19 Wound Status Wound Number: 4 Primary Etiology: Diabetic Wound/Ulcer of the Lower Extremity Wound Location: Right, Lateral Foot Wound  Status: Open Wounding Event: Gradually Appeared Comorbid History: Hypertension, Type II Diabetes, Neuropathy Date Acquired: 03/12/2019 Weeks Of Treatment: 19 Clustered Wound: No Pending Amputation On Presentation Photos Wound Measurements Length: (cm) 0.7 % Redu Width: (cm) 0.5 % Redu Depth: (cm) 0.7 Epithe Area: (cm) 0.275 Volume: (cm) 0.192 ction in Area: 56.8% ction in Volume: -51.2% lialization: None Wound Description Classification: Grade 1 Foul O Wound Margin: Flat and Intact Slough Exudate Amount: Medium Exudate Type: Serous Exudate Color: amber dor After Cleansing: No /Fibrino Yes Wound Bed Granulation Amount: Medium (34-66%) Exposed Structure Granulation Quality: Pink Fascia Exposed: No Necrotic Amount: Medium (34-66%) Fat Layer (Subcutaneous Tissue) Exposed: Yes Necrotic Quality: Adherent Slough Tendon Exposed: No Muscle Exposed: No Joint Exposed: No Bone Exposed: No Treatment Notes Wound #4 (Right, Lateral Foot) Notes scell, gauze, conform,foam, TCC BRANNAN, CASSEDY ([id number]) Electronic Signature(s) Signed: 07/29/2019 3:58:26 PM By: [name], BSN, [name], CWS, [name], BSN Entered By: [name], BSN, [name], CWS, [name] on 07/29/2019 08:36:54 [name] ([id number]) -------------------------------------------------------------------------------- Vitals Details Patient Name: [name] Date of Service: 07/29/2019 8:00 AM Medical Record Number: [id number] Patient Account Number: [id number] Date of Birth/Sex: 02-27-1965 (54 y.o. M) Treating [name]: [name] Primary Care Delecia Vastine: [name] Other Clinician: Referring Alixis Harmon: [name] Treating Zaylon Bossier/Extender: [name], [name] Weeks in Treatment: 19 Vital Signs Time Taken: 08:10 Temperature (F): 98.2 Height (in): 74 Pulse (bpm): 82 Weight (lbs): 308 Respiratory Rate (breaths/min): 18 Body Mass Index (BMI): 39.5 Blood Pressure (mmHg): 147/68 Reference Range: 80 - 120 mg /  dl Electronic Signature(s) Signed: 07/29/2019 9:37:01 AM By: [name] Entered By: [name] on 07/29/2019 08:23:02

## 2019-07-29 NOTE — Progress Notes (Addendum)
[name], [name] ([id number]) Visit Report for 07/29/2019 Chief Complaint Document Details Patient Name: [name], [name]. Date of Service: 07/29/2019 8:00 AM Medical Record Number: [id number] Patient Account Number: [id number] Date of Birth/Sex: 07-31-1965 (54 y.o. M) Treating RN: Cornell Barman Primary Care Provider: [name] Other Clinician: Referring Provider: [name] Treating Provider/Extender: Melburn Hake, Brandyce Dimario Weeks in Treatment: 19 Information Obtained from: Patient Chief Complaint Right lateral foot ulcer Electronic Signature(s) Signed: 07/29/2019 8:28:24 AM By: [name] PA-C Entered By: [name] on 07/29/2019 62:95:28 [name] ([id number]) -------------------------------------------------------------------------------- Debridement Details Patient Name: [name] Date of Service: 07/29/2019 8:00 AM Medical Record Number: [id number] Patient Account Number: [id number] Date of Birth/Sex: 1965-02-13 (54 y.o. M) Treating RN: Cornell Barman Primary Care Provider: [name] Other Clinician: Referring Provider: [name] Treating Provider/Extender: Melburn Hake, Shiro Ellerman Weeks in Treatment: 19 Debridement Performed for Wound #4 Right,Lateral Foot Assessment: Performed By: Physician STONE III, Tzvi Economou E., PA-C Debridement Type: Debridement Severity of Tissue Pre Debridement: Fat layer exposed Level of Consciousness (Pre- Awake and Alert procedure): Pre-procedure Verification/Time Out Yes - 08:34 Taken: Pain Control: Lidocaine Total Area Debrided (L x W): 0.5 (cm) x 0.3 (cm) = 0.15 (cm) Tissue and other material Viable, Non-Viable, Callus, Slough, Subcutaneous, Slough debrided: Level: Skin/Subcutaneous Tissue Debridement Description: Excisional Instrument: Curette Bleeding: Minimum Hemostasis Achieved: Pressure Response to Treatment: Procedure was tolerated well Level of Consciousness (Post- Awake and  Alert procedure): Post Debridement Measurements of Total Wound Length: (cm) 0.5 Width: (cm) 0.4 Depth: (cm) 0.9 Volume: (cm) 0.141 Character of Wound/Ulcer Post Debridement: Stable Severity of Tissue Post Debridement: Limited to breakdown of skin Post Procedure Diagnosis Same as Pre-procedure Electronic Signature(s) Signed: 07/29/2019 3:58:26 PM By: Gretta Cool, BSN, RN, CWS, Kim RN, BSN Signed: 07/29/2019 4:38:51 PM By: [name] PA-C Entered By: Gretta Cool, BSN, RN, CWS, Kim on 07/29/2019 08:35:43 [name] ([id number]) -------------------------------------------------------------------------------- HPI Details Patient Name: [name] Date of Service: 07/29/2019 8:00 AM Medical Record Number: [id number] Patient Account Number: [id number] Date of Birth/Sex: 1965-04-15 (54 y.o. M) Treating RN: Cornell Barman Primary Care Provider: [name] Other Clinician: Referring Provider: [name] Treating Provider/Extender: Melburn Hake, Dalaney Needle Weeks in Treatment: 19 History of Present Illness HPI Description: This 54 year old male comes with an ulcerated area on the plantar aspect of the right foot which she's had for approximately a month. I have known him from a previous visit at [facility] wound center and was treated in the months of April and May 2016 and rapidly healed a left plantar ulcer with a total contact cast. He has been a diabetic for about 16 years and tries to keep active and is fairly compliant with his diabetes management. 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 Past medical history significant for hypertension, hyperlipidemia, and status post appendectomy 1993. He does not smoke or drink alcohol. 01/14/2015 -- the patient had tolerated his total contact cast very well and had no problems and has had no systemic symptoms. However when his total contact cast was cut open he had excessive amount of purulent drainage in spite of being on  antibiotics. He had had a recent x-ray done in the ER 12/22/2014 which showed IMPRESSION:No evidence of osseous erosion. Known soft tissue ulceration is not well characterized on radiograph. Scattered vascular calcifications seen. his last hemoglobin A1c in December was 7.3. He has been on Augmentin and doxycycline for the last 2 weeks. 01/21/2015 -- his culture grew rare growth of Pantoea species an MR moderate growth  of Candida parapsilosis. it is sensitive to levofloxacin. He has not heard back from the insurance company regarding his hyperbaric oxygen therapy. His MRI has not been done yet and we will try and get him an earlier date 01/28/2015 -- MRI was done last night -- IMPRESSION:1. Soft tissue ulcer overlying the plantar aspect of the fifth metatarsal head extending to the cortex. Subcortical marrow edema in the fifth metatarsal head with corresponding T1 hypointensity is concerning for early osteomyelitis of the plantar lateral aspect of the fifth metatarsal head. Chest x-ray done on 01/14/2015 shows bronchiectatic changes without infiltrate. EKG done on generally 17 2017 shows a normal sinus rhythm and is a normal EKG. 02/04/2015 -- he was asked to see Dr. [name] last week and had 2 appointments but had to cancel both due to pressures of work. Last night he has woken up with severe pain in the foot and leg and it is swollen up. No fever or no change in his blood glucose. Addendum: I spoke with Dr. [name] who kindly agreed to accept the patient for inpatient therapy and have also opened to the hospitalist Dr. [name], and discuss details of the management including PICC line and repeat cultures. 02/12/2015-- -- was seen by Dr. [name] in the hospital and a PICC line was placed. He was to receive Ceftazidime 2 g every 12 hourly, oral levofloxacin 750 mg every 24 hourly and oral fluconazole 200 mg daily. The antibiotics were to be given for 4 weeks except the Diflucan was to  be given for the first 2 weeks. Reviewed note from 02/10/2015 -- and Dr. [name] had recommended management for growth of MSSA and Serratia. He switched him from ceftazidime to ceftriaxone 2 g every 24 hours. Levofloxacin was stopped and he would continue on fluconazole for another week. He had asked me to decide whether further imaging was necessary and whether surgical debridement of the infected bone was needed. He is doing well and has been off work for this week and we will keep him off the next week. 02/22/2015 -- he was seen by my colleague on 01/19/2015 and at that time an incision and drainage was done on his right lateral forefoot on the dorsum. Today when I probed this wound it is frankly draining pus and it communicates with the ulcer on the plantar aspect of his right foot. The patient is still on IV ceftriaxone 2 g every 24 hours and is to be seen by Dr. [name] on Friday. 03/19/2015 -- On 03/04/2015 I spoke to Dr. 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 He recommended operative debridement which would probably result in the fifth metatarsal head and toe amputation.The patient would be referred back to [place] once he was done with surgery. He was admitted to [facility] yesterday and had surgery done by podiatry for a right fifth metatarsal acute osteomyelitis with cellulitis and abscess. He had a right foot incision and drainage with fifth metatarsal partial amputation and removal of toe infected bone and soft tissue with cultures. The wound was partially closed and packing of the distal end was done. Patient was already on cefepime 2 g IV every 8 hourly and put on vancomycin pending final cultures. He had grown moderate gram-negative rods, and later found to be rare diphtheroids. I received a call from  Dr. Cannon Kettle  the podiatrist and we discussed the above. On 03/10/2015 he was found positive for influenza a and has been put on Tamiflu. Since his discharge he has been seen by Dr. Cannon Kettle who is planning to remove his sutures this coming week. He was reviewed by Dr. [name] on 03/17/2015 and his Diflucan was stopped and vancomycin. After the 3 doses he is taking. He is going to change Ceftazidime to Zosyn 3.375 g IV every 8 hours. 03/25/2015 -- he was seen by the podiatrist a couple of days ago and the sutures were removed. She will follow back with him in 4 weeks' time and at that time x-rays will be taken and a custom molded insert would be made for his shoe. 04/01/2015 -- he was seen by Dr. [name] on 03/29/2015 who pulled the PICC line stopped his IV antibiotics and recommended starting doxycycline and levofloxacin for 2 weeks. He also stop the fluconazole. The patient will follow up with him only when necessary. Readmission: 03/15/17 on evaluation today patient presents for initial evaluation concerning the new issues although he has previously been evaluated in our clinic. Unfortunately the previous evaluation led to the patient having to proceed to amputation and so he is somewhat nervous about being here [name], [name] ([id number]) today. With that being said he has a very slight blister that occurred on the plantar aspect more medial on the right great toe that has been present for just a very short amount of time, several days. With that being said he felt like initially when he called that he somewhat overreacted but due to the fact that his previous issue led to amputation he is very cautious these days I explained that he did the right thing. With that being said he has been tolerating the dressing changes without complication mainly he just been covering this. He does not have any discomfort in secondary to neuropathy it's unlikely that he feels much. With that being said he does tell me  that the issue that he had here is that he went barefoot when he knows he should not have which subsequently led to the blisters. He states is definitely not doing that anymore. His most recent hemoglobin A1c was December and registered 6.9 his ABI today was 1.1. 03/27/17 on evaluation today patient appears to be doing great in regard to his right great toe ulcer. He has been tolerating the dressing changes without complication. The good news is this is making excellent progress and he seems to be caring for this in an excellent fashion. I see no evidence of breakdown that would make me concerned that he was at risk for infection/amputation. This has obviously been his concern due to the fifth toe amputation that was necessitated previous when he had a similar issue. Nonetheless this seems to be progressing much more nicely. Readmission: 03/13/2019 upon evaluation today patient presents for reevaluation here in the clinic concerning issues he has been having with his right foot on the lateral portion at the proximal end of the metatarsal. Subsequently he tells me that this just opened up in the past couple of days or so and the erythema really began about 24 to 48 hours ago. Fortunately there is no signs of systemic infection. 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 That left him with this bony prominence that is where the wound is currently. There is erythema surrounding that does have me concerned about cellulitis. With  that being said he was noncompressible as far as ABIs are concerned I do think we can need to check into arterial studies at this point. Patient does have a history of hypertension along with diabetes mellitus type 2. He also tends to develop quite a bit of callus white often. 03/20/2019 upon evaluation today patient appears to be doing very well in regard to his wounds currently. He has been tolerating the dressing changes without  complication. Fortunately there is no signs of infection. His ABIs were good at this point and registered at 1.07 on the left and 1.09 on the right. In regard to the x-ray this was negative as well for any signs of osteomyelitis. The patient also seems to be doing better in regard to the infection. He still has several days of the antibiotic left at this point best the Bactrim DS and he seems to be doing very well with this. Overall I am extremely pleased with the progress in 1 week's time he is going require some sharp debridement today however. 03/27/2019 upon evaluation today patient actually appears to be making some progress in regard to his wound. It is a little deeper but measuring smaller which is good news due to the fact that again were clear away some of the necrotic tissue which is why the depth is increasing a little bit. Nonetheless after like were getting very close to being down at a good wound bed. Fortunately there is no signs of active infection at this time. There is a little bit of erythema immediately surrounding the wound that we do want to be very cognizant of and careful about. For that reason I am going to go ahead and extend his antibiotic today for an additional 10 days that is the Bactrim. 04/03/2019 upon evaluation today patient appears to be making some progress. I do feel like the Annitta Needs is doing a good job along with the alginate is being packed into the wound space behind. He has been tolerating the dressing changes without complication. Fortunately there is no signs of active infection at this time. No fevers, chills, nausea, vomiting, or diarrhea. 04/10/2019 upon evaluation today patient appears to be making progress. The wound is not quite as deep but he still does not have a great wound surface yet for working on this and the Santyl does seem to be cleaning things up. Fortunately there is no evidence of active infection at this time. No fevers, chills, nausea, vomiting,  or diarrhea. 04/17/2019 upon evaluation today patient appears to be doing excellent at this time in regard to his foot ulcer. I do believe the Santyl with the alginate packed in behind has been of benefit for him and I am very pleased in this regard. With that being said I am feeling like the wound surface is improving quite a bit each time I see him still I do not believe we are at the point of stating that the wound bed is perfect but again were making progress. The patient is going require some sharp debridement today. 4/22; this is a patient who is using Santyl with calcium alginate. Apparently his wound dimensions have been improving. Patient is changing the dressing himself. He has a surgical shoe. He works in a sitting position therefore is not on his feet all that much. There is undermining 05/01/2019 upon evaluation today patient appears to be doing a little better in regard to the size of his wound though he still has some depth. This does seem  to be much cleaner has been using Santyl on the base of the wound followed by packing with silver alginate and behind. Nonetheless I do believe that based on what we are seeing we may need to switch to a collagen type dressing endoform may actually be excellent for him. 05/08/2019 upon evaluation today patient's wound actually appears to be showing better granulation tissue in the base of the wound. With that being said he does have some epiboly noted around the edges of the wound especially plantar and more distal. Subsequently this is can require sharp debridement today. Fortunately there is no signs of active systemic infection though I do feel like there is some local cellulitis noted at this point. The patient also notes has had increased drainage. 5/13; wound volume quite a bit improved this week down to 3 mm. Patient states pain and drainage better. Culture from last week grew staph aureus [not MRSA] and this is sensitive to quinolones and he is on  Levaquin. HOWEVER he also grew abundant Enterococcus faecalis treatment of choice for this is ampicillin. Quinolone coverage is unreliable 5/20 completing the Augmentin. Small wound on the right lateral foot. Thick rolled edges around the wound. Using silver alginate 05/29/2019 upon evaluation today patient appears to be doing better compared to last time I saw him. Fortunately there is no signs of active infection and I do feel like he is actually making good progress. Overall the quality of the wound bed along with the size looks improved and the inflammation/erythema that was previously noted by myself when I saw the couple weeks back actually has also resolved and this is great. Overall very pleased with how things stand. 06/06/2019 upon evaluation today patient actually appears to be doing quite well with regard to his foot ulcer with regard to some of the granulation were seen at this point. There does not appear to be any evidence of systemic or local infection although he still has discomfort I feel like things are moving in a slow but surely correct direction. I think we may need to help fill some of the space however even though he is using the collagen we may need to have something behind this such as a plain packing strip to try to help hold the collagen in the base of the wound. 06/12/2019 upon evaluation today patient appears to be doing well with regard to his foot ulcer. This seems to be making some slow but steady progress. The wound does not appear to be as deep today compared to where it was previous. Fortunately there is no signs of active infection at this time. No fevers, chills, nausea, vomiting, or diarrhea. 06/19/2019 on evaluation today patient's wound does not appear to be doing quite as well as what I would like to see. Fortunately there is no signs of systemic infection at this time which is great news. With that being said I do believe that the patient needs to have a wound  culture as I do [name], [name]. ([id number]) believe he likely has a local infection that needs to be addressed. He is also can require some debridement today as well. 06/26/2019 upon evaluation today patient's wound actually appears to be showing signs of excellent improvement today. There does not appear to be any evidence of active infection which is great news. There does not appear to be significant erythema at this point. The patient does have improvement overall in the appearance of the infection he has been taking the Augmentin the  culture came back showing no organism was grown at this point but nonetheless I do believe he has been doing well with the antibiotics I would continue that at this point. 07/03/2019 upon evaluation today patient appears to be doing about the same in regard to his wound. There does not appear to be any evidence of active infection and overall I am pleased with where things stand. The patient is tolerating the dressing changes without complication but it does not appear that they are packing the wound at this time which I think is going to slow down his healing prospects. 07/10/2019 upon evaluation today patient appears to be doing little better in my opinion in regard to the overall moisture collected in the base of the wound. They have been packing this with a little bit of silver alginate and that has done quite well. Overall I am pleased in that regard. Subsequently the redness does not appear to be any worse today I did actually go ahead and marked this since this can be 2 weeks between now and when I see him again I would make sure that if anything changes or worsens he will be able to notice this and go ahead and initiate treatment with the Augmentin. 07/22/2019 patient comes in today a little bit early for evaluation secondary to the fact that he was having issues with increased drainage and concern about infection. With that being said he does appear to have  developed a blister area again proximal to where the original wound opening was in although the wound bed itself actually appears to be doing somewhat better he has had increased drainage and tracking of fluid here due to this blister. Nonetheless I believe that that does need to be cleaned away today but again I am not really certain that I see a lot of evidence for infection at this point. 07/29/2019 on evaluation today patient actually appears to be doing better in regard to his foot ulcer. There is improvement compared to last week which is great news. Nonetheless he does come prepared to apply the total contact cast today which I think would be beneficial for him. He knows he is not supposed to drive he did bring his son with him today in order to drive him. Electronic Signature(s) Signed: 07/29/2019 10:54:54 AM By: [name] PA-C Entered By: [name] on 07/29/2019 10:54:53 [name] ([id number]) -------------------------------------------------------------------------------- Physical Exam Details Patient Name: [name], [name] Date of Service: 07/29/2019 8:00 AM Medical Record Number: [id number] Patient Account Number: [id number] Date of Birth/Sex: 11-12-1965 (54 y.o. M) Treating RN: Cornell Barman Primary Care Provider: [name] Other Clinician: Referring Provider: [name] Treating Provider/Extender: STONE III, Atha Muradyan Weeks in Treatment: 75 Constitutional Well-nourished and well-hydrated in no acute distress. Respiratory normal breathing without difficulty. Psychiatric this patient is able to make decisions and demonstrates good insight into disease process. Alert and Oriented x 3. pleasant and cooperative. Notes His wound bed did require some sharp debridement to clear away some of the necrotic tissue from around the edges of the wound including callus as well as slough from the base of the wound down to good subcutaneous tissue. He tolerated  that debridement today without complication and post debridement the wound bed appears to be doing significantly better which is great news. Overall I see no signs of infection and I think he is appropriate to apply the total contact cast. Electronic Signature(s) Signed: 07/29/2019 10:55:17 AM By: [name] PA-C Entered By:  [name] on 07/29/2019 10:55:16 [name], [name] ([id number]) -------------------------------------------------------------------------------- Physician Orders Details Patient Name: [name], [name]. Date of Service: 07/29/2019 8:00 AM Medical Record Number: [id number] Patient Account Number: [id number] Date of Birth/Sex: 04-Jan-1965 (54 y.o. 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 - in office o Dial antibacterial soap, wash wounds, rinse and pat dry prior to dressing wounds Anesthetic (add to Medication List) Wound #4 Right,Lateral Foot o Topical Lidocaine 4% cream applied to wound bed prior to debridement (In Clinic Only). Primary Wound Dressing Wound #4 Right,Lateral Foot o Silver Alginate Secondary Dressing Wound #4 Right,Lateral Foot o Foam Dressing Change Frequency Wound #4 Right,Lateral Foot o Other: - Thursday for cast change Follow-up Appointments Wound #4 Right,Lateral Foot o Return Appointment in 1 week. o Other: - Thursday for cast  change Off-Loading Wound #4 Right,Lateral Foot o Total Contact Cast to Right Lower Extremity Electronic Signature(s) Signed: 07/29/2019 3:58:26 PM By: Gretta Cool, BSN, RN, CWS, Kim RN, BSN Signed: 07/29/2019 4:38:51 PM By: [name] PA-C Entered By: Gretta Cool, BSN, RN, CWS, Kim on 07/29/2019 08:39:04 [name] ([id number]) -------------------------------------------------------------------------------- Problem List Details Patient Name: [name], [name]. Date of Service: 07/29/2019 8:00 AM Medical Record Number: [id number] Patient Account Number: [id number] Date of Birth/Sex: [birth year] (54 y.o. M) Treating RN: Cornell Barman Primary Care Provider: [name] Other Clinician: Referring Provider: [name] Treating Provider/Extender: Melburn Hake, Shravya Wickwire Weeks in Treatment: 19 Active Problems ICD-10 Encounter Code Description Active Date MDM Diagnosis E11.621 Type 2 diabetes mellitus with foot ulcer 03/13/2019 No Yes L97.512 Non-pressure chronic ulcer of other part of right foot with fat layer 03/13/2019 No Yes exposed L03.115 Cellulitis of right lower limb 05/15/2019 No Yes L84 Corns and callosities 03/13/2019 No Yes I10 Essential (primary) hypertension 03/13/2019 No Yes Z89.422 Acquired absence of other left toe(s) 03/13/2019 No Yes Inactive Problems Resolved Problems Electronic Signature(s) Signed: 07/29/2019 8:28:16 AM By: [name] PA-C Entered By: [name] on 07/29/2019 08:28:15 [name] ([id number]) -------------------------------------------------------------------------------- Progress Note Details Patient Name: [name] Date of Service: 07/29/2019 8:00 AM Medical Record Number: [id number] Patient Account Number: [id number] Date of Birth/Sex: [birth year] (54 y.o. M) Treating RN: Cornell Barman Primary Care Provider: [name] Other Clinician: Referring Provider: [name] Treating Provider/Extender: Melburn Hake,  Kimber Fritts Weeks in Treatment: 19 Subjective Chief Complaint Information obtained from Patient Right lateral foot ulcer History of Present Illness (HPI) This 54 year old male comes with an ulcerated area on the plantar aspect of the right foot which she's had for approximately a month. I have known him from a previous visit at [facility] wound center and was treated in the months of April and May 2016 and rapidly healed a left plantar ulcer with a total contact cast. He has been a diabetic for about 16 years and tries to keep active and is fairly compliant with his diabetes management. 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 Past medical history significant for hypertension, hyperlipidemia, and status post appendectomy 1993. He does not smoke or  drink alcohol. 01/14/2015 -- the patient had tolerated his total contact cast very well and had no problems and has had no systemic symptoms. However when his total contact cast was cut open he had excessive amount of purulent drainage in spite of being on antibiotics. He had had a recent x-ray done in the ER 12/22/2014 which showed IMPRESSION:No evidence of osseous erosion. Known soft tissue ulceration is not well characterized on radiograph. Scattered vascular calcifications seen. his last hemoglobin A1c in December was 7.3. He has been on Augmentin and doxycycline for the last 2 weeks. 01/21/2015 -- his culture grew rare growth of Pantoea species an MR moderate growth of Candida parapsilosis. it is sensitive to levofloxacin. He has not heard back from the insurance company regarding his hyperbaric oxygen therapy. His MRI has not been done yet and we will try and get him an earlier date 01/28/2015 -- MRI was done last night -- IMPRESSION:1. Soft tissue ulcer overlying the plantar aspect of the fifth metatarsal head extending to the cortex. Subcortical marrow edema in the fifth metatarsal head with corresponding T1 hypointensity is concerning for early  osteomyelitis of the plantar lateral aspect of the fifth metatarsal head. Chest x-ray done on 01/14/2015 shows bronchiectatic changes without infiltrate. EKG done on generally 17 2017 shows a normal sinus rhythm and is a normal EKG. 02/04/2015 -- he was asked to see Dr. [name] last week and had 2 appointments but had to cancel both due to pressures of work. Last night he has woken up with severe pain in the foot and leg and it is swollen up. No fever or no change in his blood glucose. Addendum: I spoke with Dr. [name] who kindly agreed to accept the patient for inpatient therapy and have also opened to the hospitalist Dr. [name], and discuss details of the management including PICC line and repeat cultures. 02/12/2015-- -- was seen by Dr. [name] in the hospital and a PICC line was placed. He was to receive Ceftazidime 2 g every 12 hourly, oral levofloxacin 750 mg every 24 hourly and oral fluconazole 200 mg daily. The antibiotics were to be given for 4 weeks except the Diflucan was to be given for the first 2 weeks. Reviewed note from 02/10/2015 -- and Dr. [name] had recommended management for growth of MSSA and Serratia. He switched him from ceftazidime to ceftriaxone 2 g every 24 hours. Levofloxacin was stopped and he would continue on fluconazole for another week. He had asked me to decide whether further imaging was necessary and whether surgical debridement of the infected bone was needed. He is doing well and has been off work for this week and we will keep him off the next week. 02/22/2015 -- he was seen by my colleague on 01/19/2015 and at that time an incision and drainage was done on his right lateral forefoot on the dorsum. Today when I probed this wound it is frankly draining pus and it communicates with the ulcer on the plantar aspect of his right foot. The patient is still on IV ceftriaxone 2 g every 24 hours and is to be seen by Dr. [name] on Friday. 03/19/2015 -- On  03/04/2015 I spoke to Dr. 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 He recommended operative debridement which would probably result in the fifth metatarsal head and  toe amputation.The patient would be referred back to [place] once he was done with surgery. He was admitted to [facility] yesterday and had surgery done by podiatry for a right fifth metatarsal acute osteomyelitis with cellulitis and abscess. He had a right foot incision and drainage with fifth metatarsal partial amputation and removal of toe infected bone and soft tissue with cultures. The wound was partially closed and packing of the distal end was done. Patient was already on cefepime 2 g IV every 8 hourly and put on vancomycin pending final cultures. He had grown moderate gram-negative rods, and later found to be rare diphtheroids. I received a call from Dr. Cannon Kettle the podiatrist and we discussed the above. On 03/10/2015 he was found positive for influenza a and has been put on Tamiflu. Since his discharge he has been seen by Dr. Cannon Kettle who is planning to remove his sutures this coming week. He was reviewed by Dr. [name] on 03/17/2015 and his Diflucan was stopped and vancomycin. After the 3 doses he is taking. He is going to change Ceftazidime to Zosyn 3.375 g IV every 8 hours. 03/25/2015 -- he was seen by the podiatrist a couple of days ago and the sutures were removed. She will follow back with him in 4 weeks' time and at that time x-rays will be taken and a custom molded insert would be made for his shoe. 04/01/2015 -- he was seen by Dr. [name] on 03/29/2015 who pulled the PICC line stopped his IV antibiotics and recommended starting doxycycline and levofloxacin for 2 weeks. He also stop the fluconazole. The patient will follow up with him only when  necessary. [name], [name] ([id number]) Readmission: 03/15/17 on evaluation today patient presents for initial evaluation concerning the new issues although he has previously been evaluated in our clinic. Unfortunately the previous evaluation led to the patient having to proceed to amputation and so he is somewhat nervous about being here today. With that being said he has a very slight blister that occurred on the plantar aspect more medial on the right great toe that has been present for just a very short amount of time, several days. With that being said he felt like initially when he called that he somewhat overreacted but due to the fact that his previous issue led to amputation he is very cautious these days I explained that he did the right thing. With that being said he has been tolerating the dressing changes without complication mainly he just been covering this. He does not have any discomfort in secondary to neuropathy it's unlikely that he feels much. With that being said he does tell me that the issue that he had here is that he went barefoot when he knows he should not have which subsequently led to the blisters. He states is definitely not doing that anymore. His most recent hemoglobin A1c was December and registered 6.9 his ABI today was 1.1. 03/27/17 on evaluation today patient appears to be doing great in regard to his right great toe ulcer. He has been tolerating the dressing changes without complication. The good news is this is making excellent progress and he seems to be caring for this in an excellent fashion. I see no evidence of breakdown that would make me concerned that he was at risk for infection/amputation. This has obviously been his concern due to the fifth toe amputation that was necessitated previous when he had a similar issue. Nonetheless this seems to be progressing much more nicely.  Readmission: 03/13/2019 upon evaluation today patient presents for  reevaluation here in the clinic concerning issues he has been having with his right foot on the lateral portion at the proximal end of the metatarsal. Subsequently he tells me that this just opened up in the past couple of days or so and the erythema really began about 24 to 48 hours ago. Fortunately there is no signs of systemic infection. 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 That left him with this bony prominence that is where the wound is currently. There is erythema surrounding that does have me concerned about cellulitis. With that being said he was noncompressible as far as ABIs are concerned I do think we can need to check into arterial studies at this point. Patient does have a history of hypertension along with diabetes mellitus type 2. He also tends to develop quite a bit of callus white often. 03/20/2019 upon evaluation today patient appears to be doing very well in regard to his wounds currently. He has been tolerating the dressing changes without complication. Fortunately there is no signs of infection. His ABIs were good at this point and registered at 1.07 on the left and 1.09 on the right. In regard to the x-ray this was negative as well for any signs of osteomyelitis. The patient also seems to be doing better in regard to the infection. He still has several days of the antibiotic left at this point best the Bactrim DS and he seems to be doing very well with this. Overall I am extremely pleased with the progress in 1 week's time he is going require some sharp debridement today however. 03/27/2019 upon evaluation today patient actually appears to be making some progress in regard to his wound. It is a little deeper but measuring smaller which is good news due to the fact that again were clear away some of the necrotic tissue which is why the depth is increasing a little bit. Nonetheless after like were getting very close to being down  at a good wound bed. Fortunately there is no signs of active infection at this time. There is a little bit of erythema immediately surrounding the wound that we do want to be very cognizant of and careful about. For that reason I am going to go ahead and extend his antibiotic today for an additional 10 days that is the Bactrim. 04/03/2019 upon evaluation today patient appears to be making some progress. I do feel like the Annitta Needs is doing a good job along with the alginate is being packed into the wound space behind. He has been tolerating the dressing changes without complication. Fortunately there is no signs of active infection at this time. No fevers, chills, nausea, vomiting, or diarrhea. 04/10/2019 upon evaluation today patient appears to be making progress. The wound is not quite as deep but he still does not have a great wound surface yet for working on this and the Santyl does seem to be cleaning things up. Fortunately there is no evidence of active infection at this time. No fevers, chills, nausea, vomiting, or diarrhea. 04/17/2019 upon evaluation today patient appears to be doing excellent at this time in regard to his foot ulcer. I do believe the Santyl with the alginate packed in behind has been of benefit for him and I am very pleased in this regard. With that being said I am feeling like the wound surface is improving quite a bit each time  I see him still I do not believe we are at the point of stating that the wound bed is perfect but again were making progress. The patient is going require some sharp debridement today. 4/22; this is a patient who is using Santyl with calcium alginate. Apparently his wound dimensions have been improving. Patient is changing the dressing himself. He has a surgical shoe. He works in a sitting position therefore is not on his feet all that much. There is undermining 05/01/2019 upon evaluation today patient appears to be doing a little better in regard to the  size of his wound though he still has some depth. This does seem to be much cleaner has been using Santyl on the base of the wound followed by packing with silver alginate and behind. Nonetheless I do believe that based on what we are seeing we may need to switch to a collagen type dressing endoform may actually be excellent for him. 05/08/2019 upon evaluation today patient's wound actually appears to be showing better granulation tissue in the base of the wound. With that being said he does have some epiboly noted around the edges of the wound especially plantar and more distal. Subsequently this is can require sharp debridement today. Fortunately there is no signs of active systemic infection though I do feel like there is some local cellulitis noted at this point. The patient also notes has had increased drainage. 5/13; wound volume quite a bit improved this week down to 3 mm. Patient states pain and drainage better. Culture from last week grew staph aureus [not MRSA] and this is sensitive to quinolones and he is on Levaquin. HOWEVER he also grew abundant Enterococcus faecalis treatment of choice for this is ampicillin. Quinolone coverage is unreliable 5/20 completing the Augmentin. Small wound on the right lateral foot. Thick rolled edges around the wound. Using silver alginate 05/29/2019 upon evaluation today patient appears to be doing better compared to last time I saw him. Fortunately there is no signs of active infection and I do feel like he is actually making good progress. Overall the quality of the wound bed along with the size looks improved and the inflammation/erythema that was previously noted by myself when I saw the couple weeks back actually has also resolved and this is great. Overall very pleased with how things stand. 06/06/2019 upon evaluation today patient actually appears to be doing quite well with regard to his foot ulcer with regard to some of the granulation were seen at this  point. There does not appear to be any evidence of systemic or local infection although he still has discomfort I feel like things are moving in a slow but surely correct direction. I think we may need to help fill some of the space however even though he is using the collagen we may need to have something behind this such as a plain packing strip to try to help hold the collagen in the base of the wound. 06/12/2019 upon evaluation today patient appears to be doing well with regard to his foot ulcer. This seems to be making some slow but steady progress. The wound does not appear to be as deep today compared to where it was previous. Fortunately there is no signs of active infection at [name], [name]. ([id number]) this time. No fevers, chills, nausea, vomiting, or diarrhea. 06/19/2019 on evaluation today patient's wound does not appear to be doing quite as well as what I would like to see. Fortunately there is no signs  of systemic infection at this time which is great news. With that being said I do believe that the patient needs to have a wound culture as I do believe he likely has a local infection that needs to be addressed. He is also can require some debridement today as well. 06/26/2019 upon evaluation today patient's wound actually appears to be showing signs of excellent improvement today. There does not appear to be any evidence of active infection which is great news. There does not appear to be significant erythema at this point. The patient does have improvement overall in the appearance of the infection he has been taking the Augmentin the culture came back showing no organism was grown at this point but nonetheless I do believe he has been doing well with the antibiotics I would continue that at this point. 07/03/2019 upon evaluation today patient appears to be doing about the same in regard to his wound. There does not appear to be any evidence of active infection and overall I am  pleased with where things stand. The patient is tolerating the dressing changes without complication but it does not appear that they are packing the wound at this time which I think is going to slow down his healing prospects. 07/10/2019 upon evaluation today patient appears to be doing little better in my opinion in regard to the overall moisture collected in the base of the wound. They have been packing this with a little bit of silver alginate and that has done quite well. Overall I am pleased in that regard. Subsequently the redness does not appear to be any worse today I did actually go ahead and marked this since this can be 2 weeks between now and when I see him again I would make sure that if anything changes or worsens he will be able to notice this and go ahead and initiate treatment with the Augmentin. 07/22/2019 patient comes in today a little bit early for evaluation secondary to the fact that he was having issues with increased drainage and concern about infection. With that being said he does appear to have developed a blister area again proximal to where the original wound opening was in although the wound bed itself actually appears to be doing somewhat better he has had increased drainage and tracking of fluid here due to this blister. Nonetheless I believe that that does need to be cleaned away today but again I am not really certain that I see a lot of evidence for infection at this point. 07/29/2019 on evaluation today patient actually appears to be doing better in regard to his foot ulcer. There is improvement compared to last week which is great news. Nonetheless he does come prepared to apply the total contact cast today which I think would be beneficial for him. He knows he is not supposed to drive he did bring his son with him today in order to drive him. Objective Constitutional Well-nourished and well-hydrated in no acute distress. Vitals Time Taken: 8:10 AM, Height: 74 in,  Weight: 308 lbs, BMI: 39.5, Temperature: 98.2 F, Pulse: 82 bpm, Respiratory Rate: 18 breaths/min, Blood Pressure: 147/68 mmHg. Respiratory normal breathing without difficulty. Psychiatric this patient is able to make decisions and demonstrates good insight into disease process. Alert and Oriented x 3. pleasant and cooperative. General Notes: His wound bed did require some sharp debridement to clear away some of the necrotic tissue from around the edges of the wound including callus as well as slough from the  base of the wound down to good subcutaneous tissue. He tolerated that debridement today without complication and post debridement the wound bed appears to be doing significantly better which is great news. Overall I see no signs of infection and I think he is appropriate to apply the total contact cast. Integumentary (Hair, Skin) Wound #4 status is Open. Original cause of wound was Gradually Appeared. The wound is located on the Right,Lateral Foot. The wound measures 0.7cm length x 0.5cm width x 0.7cm depth; 0.275cm^2 area and 0.192cm^3 volume. There is Fat Layer (Subcutaneous Tissue) Exposed exposed. There is a medium amount of serous drainage noted. The wound margin is flat and intact. There is medium (34-66%) pink granulation within the wound bed. There is a medium (34-66%) amount of necrotic tissue within the wound bed including Adherent Slough. Assessment Active Problems ICD-10 Type 2 diabetes mellitus with foot ulcer 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d Cellulitis of right lower limb [name], [name]. ([id number]) Corns and callosities Essential (primary) hypertension Acquired absence of other left toe(s) Procedures Wound #4 Pre-procedure diagnosis of Wound #4 is a Diabetic Wound/Ulcer of the Lower Extremity located on the Right,Lateral Foot .Severity of Tissue Pre Debridement is: Fat layer exposed. There was a Excisional  Skin/Subcutaneous Tissue Debridement with a total area of 0.15 sq cm performed by STONE III, Verlyn Dannenberg E., PA-C. With the following instrument(s): Curette to remove Viable and Non-Viable tissue/material. Material removed includes Callus, Subcutaneous Tissue, and Slough after achieving pain control using Lidocaine. A time out was conducted at 08:34, prior to the start of the procedure. A Minimum amount of bleeding was controlled with Pressure. The procedure was tolerated well. Post Debridement Measurements: 0.5cm length x 0.4cm width x 0.9cm depth; 0.141cm^3 volume. Character of Wound/Ulcer Post Debridement is stable. Severity of Tissue Post Debridement is: Limited to breakdown of skin. 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 There was a Total Contact Cast Procedure by STONE III, Shaquandra Galano E., PA-C. Post procedure Diagnosis Wound #4: Same as Pre-Procedure Plan Wound Cleansing: Wound #4 Right,Lateral Foot: Clean wound with Normal Saline. - in office Dial antibacterial soap, wash wounds, rinse and pat dry prior to dressing wounds Anesthetic (add to Medication List): Wound #4 Right,Lateral Foot: Topical Lidocaine 4% cream applied to wound bed prior to debridement (In Clinic Only). Primary Wound Dressing: Wound #4 Right,Lateral Foot: Silver Alginate Secondary Dressing: Wound #4 Right,Lateral Foot: Foam Dressing Change Frequency: Wound #4 Right,Lateral Foot: Other: - Thursday for cast change Follow-up Appointments: Wound #4 Right,Lateral Foot: Return Appointment in 1 week. Other: - Thursday for cast change Off-Loading: Wound #4 Right,Lateral Foot: Total Contact Cast to Right Lower Extremity 1. I would recommend at this point that we go ahead and initiate a continuation of the silver alginate dressing I still think this is probably the best option for him  dressing wise. 2. I am also can recommend that we have the patient proceed with a total contact cast I did apply that myself today in the clinic and he tolerated the application without complication. I did also give him information for a lift for his other shoe to even things out a bit I think this will make a good improvement form of ambulation as well. 3. I would also recommend that we see him in 2 days time in order to make sure nothing is rubbing or  pushing on any area he is in agreement with that plan. We will subsequently reevaluate otherwise 1 week following that point. We will see patient back for reevaluation in 1 week here in the clinic. If anything worsens or changes patient will contact our office for additional recommendations. Electronic Signature(s) Signed: 07/29/2019 10:56:10 AM By: [name] PA-C Entered By: [name] on 07/29/2019 10:56:10 [name], [name] ([id number]) [name], [name] ([id number]) -------------------------------------------------------------------------------- Total Contact Cast Details Patient Name: [name], LEDO Date of Service: 07/29/2019 8:00 AM Medical Record Number: [id number] Patient Account Number: [id number] Date of Birth/Sex: Mar 04, 1965 (54 y.o. M) Treating RN: Cornell Barman Primary Care Provider: [name] Other Clinician: Referring Provider: [name] Treating Provider/Extender: Melburn Hake, Sayed Apostol Weeks in Treatment: 19 Total Contact Cast Applied for Wound Assessment: Wound #4 Right,Lateral Foot Performed By: Physician Emilio Math., PA-C Post Procedure Diagnosis Same as Pre-procedure Electronic Signature(s) Signed: 07/29/2019 3:58:26 PM By: Gretta Cool, BSN, RN, CWS, Kim RN, BSN Signed: 07/29/2019 4:38:51 PM By: [name] PA-C Entered By: Gretta Cool, BSN, RN, CWS, Kim on 07/29/2019 08:37:51 [name]  ([id number]) -------------------------------------------------------------------------------- SuperBill Details Patient Name: [name] Date of Service: 07/29/2019 Medical Record Number: [id number] Patient Account Number: [id number] Date of Birth/Sex: [birth year] (54 y.o. 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) Facility Procedures CPT4 Code: [id number] Description: 20254 - DEB SUBQ TISSUE 20 SQ CM/< Modifier: Quantity: 1 CPT4 Code: Description: ICD-10 Diagnosis Description L97.512 Non-pressure chronic ulcer of other part of right foot with fat layer ex Modifier: posed Quantity: Physician Procedures CPT4 Code: 2706237 Description: 11042 - WC PHYS SUBQ TISS 20 SQ CM Modifier: Quantity: 1 CPT4 Code: Description: ICD-10 Diagnosis Description L97.512 Non-pressure chronic ulcer of other part of right foot with fat layer ex Modifier: posed Quantity: Electronic Signature(s) Signed: 07/29/2019 10:56:34 AM By: [name] PA-C Entered By: [name] on 07/29/2019 10:56:32

## 2019-07-31 ENCOUNTER — Ambulatory Visit: Payer: Managed Care, Other (non HMO) | Admitting: Neurology

## 2019-07-31 ENCOUNTER — Encounter: Payer: Managed Care, Other (non HMO) | Admitting: Physician Assistant

## 2019-07-31 ENCOUNTER — Other Ambulatory Visit: Payer: Self-pay

## 2019-07-31 ENCOUNTER — Encounter: Payer: Self-pay | Admitting: Neurology

## 2019-07-31 VITALS — BP 149/83 | HR 76 | Ht 74.0 in | Wt 310.0 lb

## 2019-07-31 DIAGNOSIS — E1169 Type 2 diabetes mellitus with other specified complication: Secondary | ICD-10-CM

## 2019-07-31 DIAGNOSIS — E11621 Type 2 diabetes mellitus with foot ulcer: Secondary | ICD-10-CM

## 2019-07-31 DIAGNOSIS — M869 Osteomyelitis, unspecified: Secondary | ICD-10-CM

## 2019-07-31 DIAGNOSIS — G4730 Sleep apnea, unspecified: Secondary | ICD-10-CM | POA: Diagnosis not present

## 2019-07-31 DIAGNOSIS — L97509 Non-pressure chronic ulcer of other part of unspecified foot with unspecified severity: Secondary | ICD-10-CM

## 2019-07-31 DIAGNOSIS — Z794 Long term (current) use of insulin: Secondary | ICD-10-CM

## 2019-07-31 DIAGNOSIS — E1152 Type 2 diabetes mellitus with diabetic peripheral angiopathy with gangrene: Secondary | ICD-10-CM | POA: Diagnosis not present

## 2019-07-31 DIAGNOSIS — G4710 Hypersomnia, unspecified: Secondary | ICD-10-CM

## 2019-07-31 NOTE — Progress Notes (Signed)
SLEEP MEDICINE CLINIC    Provider:  Larey Seat, MD  Primary Care Physician:  [name], [place] [address]     Referring Provider: [name], [place] [address],  [place] [address]          Chief Complaint according to patient   Patient presents with:    . New Patient (Initial Visit)     pt alone, rm 10. pt here for R after sleep studies.        HISTORY OF PRESENT ILLNESS:  [name] is a 54 year old Caucasian male patient seen here in a RV  on 07/31/2019 from Dr. Dagmar Hait , originally seen for an evaluation of snoring and non restorative sleep. He has started to sleep in a recliner.   Chief concern according to patient : " My back bothers me, and we just bought a whole new mattress".  He also reports he needs to be reclined and his wife has RLS .       RV on 07-31-2019: I have the pleasure of seeing [name] today, a right -handed Caucasian male who  has a past medical history of Broken ankle, Broken arm, Diabetes mellitus without complication ([place]), Diabetic retinopathy ([place]), History of Bell's palsy, Hyperlipidemia, Hypertension, and Osteomyelitis ([place]). DM diagnosed at age 16.  Mr. Laurel underwent a sleep test home sleep test on May 12 which showed overall moderately severe obstructive sleep apnea with an AHI of 18.8 but in rem sleep exacerbated to an age of 9.  There were intermittent frequent brief oxygen desaturations noted they clustered in rem sleep as well.  He slept only in the supine sleep position.  I ordered an auto titration device with heated humidification and a mask that should be fitted to him the setting was suggested to be 6 cmH2O through 18 cmH2O this 2 cm expiratory pressure relief.  To his own surprise Mr. Nees actually tolerates the CPAP very well and he wakes up more refreshed and restored.  He has been 100% compliant user by days and the 93% compliant user by the time the average  user time is 5 hours 59 minutes.  The settings are as ordered with a 95th percentile pressure of only 8.5 cmH2O which is less than I expected him to need.  He does have some significant air leakage which may be dependent on how the mask fits him and I am certainly in favor of changing him to a nasal pillow or nasal mask that may be adjusted in size.  He does have full facial hair so a full facemask is usually not sealing well.  His residual AHI is only 2.3 so in spite of air leakage there is a very good control of apnea and there are no central apneas arising.  His nasal pillows may slide off?       Review of Systems: Out of a complete 14 system review, the patient complains of only the following symptoms, and all other reviewed systems are negative.:  Fatigue, sleepiness , snoring, fragmented sleep, Insomnia with shifting from bed to recliner. Back pain  , diabetic foot ulcer.   Recent lower GI bleed, and anemia slowly recovering- he had colonoscopy [phone] and had polyps removed.   Orthostatic dizziness, tinnitus.    How likely are you to doze in the following situations: 0 = not likely, 1 = slight chance, 2 = moderate chance, 3 = high chance  Sitting and Reading? Watching Television? Sitting inactive in a public place (theater or meeting)? As a passenger in a car for an hour without a break? Lying down in the afternoon when circumstances permit? Sitting and talking to someone? Sitting quietly after lunch without alcohol? In a car, while stopped for a few minutes in traffic?   Total = 2 from 5 / 24 points   FSS endorsed at 20 from 27 / 63 points.   Social History   Socioeconomic History  . Marital status: Married    Spouse name: Not on file  . Number of children: 3  . Years of education: Not on file  . Highest education level: Not on file  Occupational History  . Not on file  Tobacco Use  . Smoking status: Never Smoker  . Smokeless tobacco: Never Used  Vaping Use  .  Vaping Use: Never used  Substance and Sexual Activity  . Alcohol use: No  . Drug use: No  . Sexual activity: Not on file  Other Topics Concern  . Not on file  Social History Narrative  . Not on file   Social Determinants of Health   Financial Resource Strain:   . Difficulty of Paying Living Expenses:   Food Insecurity:   . Worried About Charity fundraiser in the Last Year:   . Arboriculturist in the Last Year:   Transportation Needs:   . Film/video editor (Medical):   Marland Kitchen Lack of Transportation (Non-Medical):   Physical Activity:   . Days of Exercise per Week:   . Minutes of Exercise per Session:   Stress:   . Feeling of Stress :   Social Connections:   . Frequency of Communication with Friends and Family:   . Frequency of Social Gatherings with Friends and Family:   . Attends Religious Services:   . Active Member of Clubs or Organizations:   . Attends Archivist Meetings:   Marland Kitchen Marital Status:     Family History  Problem Relation Age of Onset  . Diabetes Father   . Colon cancer Father   . Colon polyps Father   . Esophageal cancer Neg Hx   . Rectal cancer Neg Hx   . Stomach cancer Neg Hx     Past Medical History:  Diagnosis Date  . Broken ankle   . Broken arm   . Diabetes mellitus without complication ([place])    type 2  . Diabetic retinopathy ([place])    PDR OU  . History of Bell's palsy   . Hyperlipidemia   . Hypertension   . Osteomyelitis ([place])    foot    Past Surgical History:  Procedure Laterality Date  . AMPUTATION Right 03/04/2015   Procedure: PARTIAL AMPUTATION RIGHT 5TH METATARSAL;  Surgeon: Landis Martins, DPM;  Location: [facility];  Service: Podiatry;  Laterality: Right;  . APPENDECTOMY    . COLONOSCOPY  11/17/2005   TAs - [facility]  . COLONOSCOPY N/A 01/25/2019   Procedure: COLONOSCOPY;  Surgeon: Juanita Craver, MD;  Location: [facility] ENDOSCOPY;  Service: Endoscopy;  Laterality: N/A;  . EYE SURGERY Bilateral    blood vessels -cautery  .  HEMOSTASIS CLIP PLACEMENT  01/25/2019   Procedure: HEMOSTASIS CLIP PLACEMENT;  Surgeon: Juanita Craver, MD;  Location[facility] ENDOSCOPY;  Service: Endoscopy;;  . I & D EXTREMITY Right 03/04/2015   Procedure: IRRIGATION AND DEBRIDEMENT RIGHT FOOT;  Surgeon: Landis Martins, DPM;  Location: [facility];  Service: Podiatry;  Laterality: Right;  .  WISDOM TOOTH EXTRACTION     only 2 ext     Current Outpatient Medications on File Prior to Visit  Medication Sig Dispense Refill  . Continuous Blood Gluc Sensor (FREESTYLE LIBRE 14 DAY SENSOR) MISC SMARTSIG:1 Topical Every 2 Weeks    . empagliflozin (JARDIANCE) 25 MG TABS tablet Take 25 mg by mouth daily.    . fluticasone (FLONASE ALLERGY RELIEF) 50 MCG/ACT nasal spray Place 2 sprays into both nostrils daily as needed for allergies or rhinitis.    Marland Kitchen glimepiride (AMARYL) 2 MG tablet Take 2 mg by mouth 2 (two) times daily.    . Insulin Degludec (TRESIBA FLEXTOUCH) 200 UNIT/ML SOPN Inject 100 Units into the skin daily before breakfast.     . levofloxacin (LEVAQUIN) 500 MG tablet Take 500 mg by mouth daily.    . metFORMIN (GLUCOPHAGE) 1000 MG tablet Take 1,000 mg by mouth 2 (two) times daily.    . Multiple Vitamin (MULTIVITAMIN) tablet Take 1 tablet by mouth daily.    . Olmesartan-Amlodipine-HCTZ 40-10-25 MG TABS Take 1 tablet by mouth daily. Reported on 02/04/2015    . omega-3 acid ethyl esters (LOVAZA) 1 g capsule Take 1 capsule by mouth 2 (two) times daily.    . rosuvastatin (CRESTOR) 10 MG tablet Take 10 mg by mouth daily.     No current facility-administered medications on file prior to visit.    No Known Allergies  Physical exam:  Today's Vitals   07/31/19 1537  BP: (!) 149/83  Pulse: 76  Weight: (!) 310 lb (140.6 kg)  Height: 6\' 2"  (1.88 m)   Body mass index is 39.8 kg/m.   Wt Readings from Last 3 Encounters:  07/31/19 (!) 310 lb (140.6 kg)  04/08/19 (!) 308 lb (139.7 kg)  01/25/19 (!) 306 lb 0.2 oz (138.8 kg)     Ht Readings from Last 3  Encounters:  07/31/19 6\' 2"  (1.88 m)  04/08/19 6\' 2"  (1.88 m)  01/25/19 6\' 2"  (1.88 m)      General: The patient is awake, alert and appears not in acute distress. The patient is well groomed. Head: Normocephalic, atraumatic. Neck is supple. Mallampati 2- lateral crowding.  ,  neck circumference: inches 19.5  . Nasal airflow congestion.  facial hair. Chin strap user.  Cardiovascular:   pulse without distended neck veins. Heart beat is irregular.  Respiratory: Lungs are clear to auscultation.  Skin:  Without evidence of ankle edema, or rash. Trunk: The patient's posture is erect.   Neurologic exam : The patient is awake and alert, oriented to place and time.   Memory subjective described as intact.  Attention span & concentration ability appears normal.  Speech is fluent,  without  dysarthria, dysphonia or aphasia.  Mood and affect are appropriate.   Cranial nerves: no loss of smell or taste reported  Pupils are equal and briskly reactive to light. Funduscopic exam deferred.  Extraocular movements in vertical and horizontal planes were intact and without nystagmus. No Diplopia. Visual fields by finger perimetry are intact. Hearing was intact to soft voice and finger rubbing.    Facial sensation intact to fine touch.  Facial motor strength is symmetric and tongue and uvula move midline.  Neck ROM : rotation, tilt and flexion extension were normal for age and shoulder shrug was symmetrical.       After spending a total time of 15 minutes face to face and additional time for physical and neurologic examination, review of laboratory studies,  personal review  of imaging studies, reports and results of other testing and review of referral information / records as far as provided in visit, I have established the following assessments:  Mr. Binns can continue to use CPAP at the current level and settings.  An After Visit Summary was printed and given to the patient. Rv in 12 month .     My Plan is to proceed with: I would like to thank Avva, Ravisankar, MD  for allowing me to meet with and to take care of this pleasant patient.    Electronically signed by: Larey Seat, MD 07/31/2019 3:45 PM  [facility] certified by The [employer] of Sleep Medicine and Diplomate of the [employer] of Sleep Medicine. Board certified In Neurology through the [employer], Fellow of the [employer] of Neurology. Medical Director of [employer].

## 2019-07-31 NOTE — Patient Instructions (Signed)
Sleep Apnea Sleep apnea is a condition in which breathing pauses or becomes shallow during sleep. Episodes of sleep apnea usually last 10 seconds or longer, and they may occur as many as 20 times an hour. Sleep apnea disrupts your sleep and keeps your body from getting the rest that it needs. This condition can increase your risk of certain health problems, including:  Heart attack.  Stroke.  Obesity.  Diabetes.  Heart failure.  Irregular heartbeat. What are the causes? There are three kinds of sleep apnea:  Obstructive sleep apnea. This kind is caused by a blocked or collapsed airway.  Central sleep apnea. This kind happens when the part of the brain that controls breathing does not send the correct signals to the muscles that control breathing.  Mixed sleep apnea. This is a combination of obstructive and central sleep apnea. The most common cause of this condition is a collapsed or blocked airway. An airway can collapse or become blocked if:  Your throat muscles are abnormally relaxed.  Your tongue and tonsils are larger than normal.  You are overweight.  Your airway is smaller than normal. What increases the risk? You are more likely to develop this condition if you:  Are overweight.  Smoke.  Have a smaller than normal airway.  Are elderly.  Are male.  Drink alcohol.  Take sedatives or tranquilizers.  Have a family history of sleep apnea. What are the signs or symptoms? Symptoms of this condition include:  Trouble staying asleep.  Daytime sleepiness and tiredness.  Irritability.  Loud snoring.  Morning headaches.  Trouble concentrating.  Forgetfulness.  Decreased interest in sex.  Unexplained sleepiness.  Mood swings.  Personality changes.  Feelings of depression.  Waking up often during the night to urinate.  Dry mouth.  Sore throat. How is this diagnosed? This condition may be diagnosed with:  A medical history.  A physical  exam.  A series of tests that are done while you are sleeping (sleep study). These tests are usually done in a sleep lab, but they may also be done at home. How is this treated? Treatment for this condition aims to restore normal breathing and to ease symptoms during sleep. It may involve managing health issues that can affect breathing, such as high blood pressure or obesity. Treatment may include:  Sleeping on your side.  Using a decongestant if you have nasal congestion.  Avoiding the use of depressants, including alcohol, sedatives, and narcotics.  Losing weight if you are overweight.  Making changes to your diet.  Quitting smoking.  Using a device to open your airway while you sleep, such as: ? An oral appliance. This is a custom-made mouthpiece that shifts your lower jaw forward. ? A continuous positive airway pressure (CPAP) device. This device blows air through a mask when you breathe out (exhale). ? A nasal expiratory positive airway pressure (EPAP) device. This device has valves that you put into each nostril. ? A bi-level positive airway pressure (BPAP) device. This device blows air through a mask when you breathe in (inhale) and breathe out (exhale).  Having surgery if other treatments do not work. During surgery, excess tissue is removed to create a wider airway. It is important to get treatment for sleep apnea. Without treatment, this condition can lead to:  High blood pressure.  Coronary artery disease.  In men, an inability to achieve or maintain an erection (impotence).  Reduced thinking abilities. Follow these instructions at home: Lifestyle  Make any lifestyle changes   that your health care provider recommends.  Eat a healthy, well-balanced diet.  Take steps to lose weight if you are overweight.  Avoid using depressants, including alcohol, sedatives, and narcotics.  Do not use any products that contain nicotine or tobacco, such as cigarettes,  e-cigarettes, and chewing tobacco. If you need help quitting, ask your health care provider. General instructions  Take over-the-counter and prescription medicines only as told by your health care provider.  If you were given a device to open your airway while you sleep, use it only as told by your health care provider.  If you are having surgery, make sure to tell your health care provider you have sleep apnea. You may need to bring your device with you.  Keep all follow-up visits as told by your health care provider. This is important. Contact a health care provider if:  The device that you received to open your airway during sleep is uncomfortable or does not seem to be working.  Your symptoms do not improve.  Your symptoms get worse. Get help right away if:  You develop: ? Chest pain. ? Shortness of breath. ? Discomfort in your back, arms, or stomach.  You have: ? Trouble speaking. ? Weakness on one side of your body. ? Drooping in your face. These symptoms may represent a serious problem that is an emergency. Do not wait to see if the symptoms will go away. Get medical help right away. Call your local emergency services (911 in the [place] Do not drive yourself to the hospital. Summary  Sleep apnea is a condition in which breathing pauses or becomes shallow during sleep.  The most common cause is a collapsed or blocked airway.  The goal of treatment is to restore normal breathing and to ease symptoms during sleep. This information is not intended to replace advice given to you by your health care provider. Make sure you discuss any questions you have with your health care provider. Document Revised: 06/05/2018 Document Reviewed: 08/14/2017 Elsevier Patient Education  2020 [employer]  

## 2019-07-31 NOTE — Progress Notes (Addendum)
OLON, RUSS ([id number]) Visit Report for 07/31/2019 Chief Complaint Document Details Patient Name: [name], [name]. Date of Service: 07/31/2019 1:30 PM Medical Record Number: [id number] Patient Account Number: [id number] Date of Birth/Sex: Aug 28, 1965 (54 y.o. M) Treating RN: Cornell Barman Primary Care Provider: [name] Other Clinician: Referring Provider: [name] Treating Provider/Extender: Melburn Hake, Lourdes Manning Weeks in Treatment: 20 Information Obtained from: Patient Chief Complaint Right lateral foot ulcer Electronic Signature(s) Signed: 07/31/2019 1:41:30 PM By: [name] PA-C Entered By: [name] on 07/31/2019 13:41:30 [name] ([id number]) -------------------------------------------------------------------------------- HPI Details Patient Name: [name] Date of Service: 07/31/2019 1:30 PM Medical Record Number: [id number] Patient Account Number: [id number] Date of Birth/Sex: 02-09-1965 (54 y.o. M) Treating RN: Cornell Barman Primary Care Provider: [name] Other Clinician: Referring Provider: [name] Treating Provider/Extender: Melburn Hake, Yancy Hascall Weeks in Treatment: 20 History of Present Illness HPI Description: This 54 year old male comes with an ulcerated area on the plantar aspect of the right foot which she's had for approximately a month. I have known him from a previous visit at [facility] wound center and was treated in the months of April and May 2016 and rapidly healed a left plantar ulcer with a total contact cast. He has been a diabetic for about 16 years and tries to keep active and is fairly compliant with his diabetes management. 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 Past medical history significant for hypertension, hyperlipidemia, and status post appendectomy 1993. He does not smoke or drink alcohol. 01/14/2015 -- the patient had tolerated his total contact cast very well and had no  problems and has had no systemic symptoms. However when his total contact cast was cut open he had excessive amount of purulent drainage in spite of being on antibiotics. He had had a recent x-ray done in the ER 12/22/2014 which showed IMPRESSION:No evidence of osseous erosion. Known soft tissue ulceration is not well characterized on radiograph. Scattered vascular calcifications seen. his last hemoglobin A1c in December was 7.3. He has been on Augmentin and doxycycline for the last 2 weeks. 01/21/2015 -- his culture grew rare growth of Pantoea species an MR moderate growth of Candida parapsilosis. it is sensitive to levofloxacin. He has not heard back from the insurance company regarding his hyperbaric oxygen therapy. His MRI has not been done yet and we will try and get him an earlier date 01/28/2015 -- MRI was done last night -- IMPRESSION:1. Soft tissue ulcer overlying the plantar aspect of the fifth metatarsal head extending to the cortex. Subcortical marrow edema in the fifth metatarsal head with corresponding T1 hypointensity is concerning for early osteomyelitis of the plantar lateral aspect of the fifth metatarsal head. Chest x-ray done on 01/14/2015 shows bronchiectatic changes without infiltrate. EKG done on generally 17 2017 shows a normal sinus rhythm and is a normal EKG. 02/04/2015 -- he was asked to see Dr. [name] last week and had 2 appointments but had to cancel both due to pressures of work. Last night he has woken up with severe pain in the foot and leg and it is swollen up. No fever or no change in his blood glucose. Addendum: I spoke with Dr. [name] who kindly agreed to accept the patient for inpatient therapy and have also opened to the hospitalist Dr. [name], and discuss details of the management including PICC line and repeat cultures. 02/12/2015-- -- was seen by Dr. [name] in the hospital and a PICC line was placed. He was to receive Ceftazidime 2  g every 12  hourly, oral levofloxacin 750 mg every 24 hourly and oral fluconazole 200 mg daily. The antibiotics were to be given for 4 weeks except the Diflucan was to be given for the first 2 weeks. Reviewed note from 02/10/2015 -- and Dr. [name] had recommended management for growth of MSSA and Serratia. He switched him from ceftazidime to ceftriaxone 2 g every 24 hours. Levofloxacin was stopped and he would continue on fluconazole for another week. He had asked me to decide whether further imaging was necessary and whether surgical debridement of the infected bone was needed. He is doing well and has been off work for this week and we will keep him off the next week. 02/22/2015 -- he was seen by my colleague on 01/19/2015 and at that time an incision and drainage was done on his right lateral forefoot on the dorsum. Today when I probed this wound it is frankly draining pus and it communicates with the ulcer on the plantar aspect of his right foot. The patient is still on IV ceftriaxone 2 g every 24 hours and is to be seen by Dr. [name] on Friday. 03/19/2015 -- On 03/04/2015 I spoke to Dr. 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 He recommended operative debridement which would probably result in the fifth metatarsal head and toe amputation.The patient would be referred back to [place] once he was done with surgery. He was admitted to [facility] yesterday and had surgery done by podiatry for a right fifth metatarsal acute osteomyelitis with cellulitis and abscess. He had a right foot incision and drainage with fifth metatarsal partial amputation and removal of toe infected bone and soft tissue with cultures. The wound was partially closed and packing of the distal end was done. Patient was already on cefepime 2 g IV every 8 hourly and put  on vancomycin pending final cultures. He had grown moderate gram-negative rods, and later found to be rare diphtheroids. I received a call from Dr. Cannon Kettle the podiatrist and we discussed the above. On 03/10/2015 he was found positive for influenza a and has been put on Tamiflu. Since his discharge he has been seen by Dr. Cannon Kettle who is planning to remove his sutures this coming week. He was reviewed by Dr. [name] on 03/17/2015 and his Diflucan was stopped and vancomycin. After the 3 doses he is taking. He is going to change Ceftazidime to Zosyn 3.375 g IV every 8 hours. 03/25/2015 -- he was seen by the podiatrist a couple of days ago and the sutures were removed. She will follow back with him in 4 weeks' time and at that time x-rays will be taken and a custom molded insert would be made for his shoe. 04/01/2015 -- he was seen by Dr. [name] on 03/29/2015 who pulled the PICC line stopped his IV antibiotics and recommended starting doxycycline and levofloxacin for 2 weeks. He also stop the fluconazole. The patient will follow up with him only when necessary. Readmission: 03/15/17 on evaluation today patient presents for initial evaluation concerning the new issues although he has previously been evaluated in our clinic. Unfortunately the previous evaluation led to the patient having to proceed to amputation and so he is somewhat nervous about being here [name], [name] ([id number]) today. With that being said he has a very  slight blister that occurred on the plantar aspect more medial on the right great toe that has been present for just a very short amount of time, several days. With that being said he felt like initially when he called that he somewhat overreacted but due to the fact that his previous issue led to amputation he is very cautious these days I explained that he did the right thing. With that being said he has been tolerating the dressing changes without complication mainly  he just been covering this. He does not have any discomfort in secondary to neuropathy it's unlikely that he feels much. With that being said he does tell me that the issue that he had here is that he went barefoot when he knows he should not have which subsequently led to the blisters. He states is definitely not doing that anymore. His most recent hemoglobin A1c was December and registered 6.9 his ABI today was 1.1. 03/27/17 on evaluation today patient appears to be doing great in regard to his right great toe ulcer. He has been tolerating the dressing changes without complication. The good news is this is making excellent progress and he seems to be caring for this in an excellent fashion. I see no evidence of breakdown that would make me concerned that he was at risk for infection/amputation. This has obviously been his concern due to the fifth toe amputation that was necessitated previous when he had a similar issue. Nonetheless this seems to be progressing much more nicely. Readmission: 03/13/2019 upon evaluation today patient presents for reevaluation here in the clinic concerning issues he has been having with his right foot on the lateral portion at the proximal end of the metatarsal. Subsequently he tells me that this just opened up in the past couple of days or so and the erythema really began about 24 to 48 hours ago. Fortunately there is no signs of systemic infection. 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 That left him with this bony prominence that is where the wound is currently. There is erythema surrounding that does have me concerned about cellulitis. With that being said he was noncompressible as far as ABIs are concerned I do think we can need to check into arterial studies at this point. Patient does have a history of hypertension along with diabetes mellitus type 2. He also tends to develop quite a bit of callus white  often. 03/20/2019 upon evaluation today patient appears to be doing very well in regard to his wounds currently. He has been tolerating the dressing changes without complication. Fortunately there is no signs of infection. His ABIs were good at this point and registered at 1.07 on the left and 1.09 on the right. In regard to the x-ray this was negative as well for any signs of osteomyelitis. The patient also seems to be doing better in regard to the infection. He still has several days of the antibiotic left at this point best the Bactrim DS and he seems to be doing very well with this. Overall I am extremely pleased with the progress in 1 week's time he is going require some sharp debridement today however. 03/27/2019 upon evaluation today patient actually appears to be making some progress in regard to his wound. It is a little deeper but measuring smaller which is good news due to the fact that again were clear away some of the necrotic tissue which is why the depth is increasing  a little bit. Nonetheless after like were getting very close to being down at a good wound bed. Fortunately there is no signs of active infection at this time. There is a little bit of erythema immediately surrounding the wound that we do want to be very cognizant of and careful about. For that reason I am going to go ahead and extend his antibiotic today for an additional 10 days that is the Bactrim. 04/03/2019 upon evaluation today patient appears to be making some progress. I do feel like the Annitta Needs is doing a good job along with the alginate is being packed into the wound space behind. He has been tolerating the dressing changes without complication. Fortunately there is no signs of active infection at this time. No fevers, chills, nausea, vomiting, or diarrhea. 04/10/2019 upon evaluation today patient appears to be making progress. The wound is not quite as deep but he still does not have a great wound surface yet for  working on this and the Santyl does seem to be cleaning things up. Fortunately there is no evidence of active infection at this time. No fevers, chills, nausea, vomiting, or diarrhea. 04/17/2019 upon evaluation today patient appears to be doing excellent at this time in regard to his foot ulcer. I do believe the Santyl with the alginate packed in behind has been of benefit for him and I am very pleased in this regard. With that being said I am feeling like the wound surface is improving quite a bit each time I see him still I do not believe we are at the point of stating that the wound bed is perfect but again were making progress. The patient is going require some sharp debridement today. 4/22; this is a patient who is using Santyl with calcium alginate. Apparently his wound dimensions have been improving. Patient is changing the dressing himself. He has a surgical shoe. He works in a sitting position therefore is not on his feet all that much. There is undermining 05/01/2019 upon evaluation today patient appears to be doing a little better in regard to the size of his wound though he still has some depth. This does seem to be much cleaner has been using Santyl on the base of the wound followed by packing with silver alginate and behind. Nonetheless I do believe that based on what we are seeing we may need to switch to a collagen type dressing endoform may actually be excellent for him. 05/08/2019 upon evaluation today patient's wound actually appears to be showing better granulation tissue in the base of the wound. With that being said he does have some epiboly noted around the edges of the wound especially plantar and more distal. Subsequently this is can require sharp debridement today. Fortunately there is no signs of active systemic infection though I do feel like there is some local cellulitis noted at this point. The patient also notes has had increased drainage. 5/13; wound volume quite a bit  improved this week down to 3 mm. Patient states pain and drainage better. Culture from last week grew staph aureus [not MRSA] and this is sensitive to quinolones and he is on Levaquin. HOWEVER he also grew abundant Enterococcus faecalis treatment of choice for this is ampicillin. Quinolone coverage is unreliable 5/20 completing the Augmentin. Small wound on the right lateral foot. Thick rolled edges around the wound. Using silver alginate 05/29/2019 upon evaluation today patient appears to be doing better compared to last time I saw him. Fortunately there is no  signs of active infection and I do feel like he is actually making good progress. Overall the quality of the wound bed along with the size looks improved and the inflammation/erythema that was previously noted by myself when I saw the couple weeks back actually has also resolved and this is great. Overall very pleased with how things stand. 06/06/2019 upon evaluation today patient actually appears to be doing quite well with regard to his foot ulcer with regard to some of the granulation were seen at this point. There does not appear to be any evidence of systemic or local infection although he still has discomfort I feel like things are moving in a slow but surely correct direction. I think we may need to help fill some of the space however even though he is using the collagen we may need to have something behind this such as a plain packing strip to try to help hold the collagen in the base of the wound. 06/12/2019 upon evaluation today patient appears to be doing well with regard to his foot ulcer. This seems to be making some slow but steady progress. The wound does not appear to be as deep today compared to where it was previous. Fortunately there is no signs of active infection at this time. No fevers, chills, nausea, vomiting, or diarrhea. 06/19/2019 on evaluation today patient's wound does not appear to be doing quite as well as what I would  like to see. Fortunately there is no signs of systemic infection at this time which is great news. With that being said I do believe that the patient needs to have a wound culture as I do [name], [name]. ([id number]) believe he likely has a local infection that needs to be addressed. He is also can require some debridement today as well. 06/26/2019 upon evaluation today patient's wound actually appears to be showing signs of excellent improvement today. There does not appear to be any evidence of active infection which is great news. There does not appear to be significant erythema at this point. The patient does have improvement overall in the appearance of the infection he has been taking the Augmentin the culture came back showing no organism was grown at this point but nonetheless I do believe he has been doing well with the antibiotics I would continue that at this point. 07/03/2019 upon evaluation today patient appears to be doing about the same in regard to his wound. There does not appear to be any evidence of active infection and overall I am pleased with where things stand. The patient is tolerating the dressing changes without complication but it does not appear that they are packing the wound at this time which I think is going to slow down his healing prospects. 07/10/2019 upon evaluation today patient appears to be doing little better in my opinion in regard to the overall moisture collected in the base of the wound. They have been packing this with a little bit of silver alginate and that has done quite well. Overall I am pleased in that regard. Subsequently the redness does not appear to be any worse today I did actually go ahead and marked this since this can be 2 weeks between now and when I see him again I would make sure that if anything changes or worsens he will be able to notice this and go ahead and initiate treatment with the Augmentin. 07/22/2019 patient comes in today a  little bit early for evaluation secondary to the fact  that he was having issues with increased drainage and concern about infection. With that being said he does appear to have developed a blister area again proximal to where the original wound opening was in although the wound bed itself actually appears to be doing somewhat better he has had increased drainage and tracking of fluid here due to this blister. Nonetheless I believe that that does need to be cleaned away today but again I am not really certain that I see a lot of evidence for infection at this point. 07/29/2019 on evaluation today patient actually appears to be doing better in regard to his foot ulcer. There is improvement compared to last week which is great news. Nonetheless he does come prepared to apply the total contact cast today which I think would be beneficial for him. He knows he is not supposed to drive he did bring his son with him today in order to drive him. 07/31/2019 upon evaluation today patient appears to be doing well with regard to his wound today. I do not see any signs of infection I do believe that he is definitely showing some signs of improvement here in the office in a couple days as far as the overall erythema and pressure getting to the wound area is concerned. With that being said I do believe that the cast has been of great benefit for him. Electronic Signature(s) Signed: 07/31/2019 2:16:03 PM By: [name] PA-C Entered By: [name] on 07/31/2019 14:16:03 [name] ([id number]) -------------------------------------------------------------------------------- Physical Exam Details Patient Name: [name], [name] Date of Service: 07/31/2019 1:30 PM Medical Record Number: [id number] Patient Account Number: [id number] Date of Birth/Sex: 05/09/1965 (54 y.o. M) Treating RN: Cornell Barman Primary Care Provider: [name] Other Clinician: Referring Provider: [name] Treating Provider/Extender: STONE III, Andrey Mccaskill Weeks in Treatment: 45 Constitutional Well-nourished and well-hydrated in no acute distress. Respiratory normal breathing without difficulty. Psychiatric this patient is able to make decisions and demonstrates good insight into disease process. Alert and Oriented x 3. pleasant and cooperative. Notes Patient's wound bed currently did not require any sharp debridement which is great news there is overall no signs of active infection also excellent news. In general I am extremely pleased with where things stand today. I think the cast is doing him good at this point. Electronic Signature(s) Signed: 07/31/2019 2:16:25 PM By: [name] PA-C Entered By: [name] on 07/31/2019 14:16:24 [name] ([id number]) -------------------------------------------------------------------------------- Physician Orders Details Patient Name: [name] Date of Service: 07/31/2019 1:30 PM Medical Record Number: [id number] Patient Account Number: [id number] Date of Birth/Sex: [birth year] (54 y.o. 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 - in office o Dial antibacterial soap, wash wounds, rinse and pat dry prior to dressing wounds Anesthetic (add to Medication List) Wound #4 Right,Lateral Foot o Topical Lidocaine 4% cream applied to wound  bed prior to debridement (In Clinic Only). Primary Wound Dressing Wound #4 Right,Lateral Foot o Alginate - 2nd o Silver Collagen - 1st  Secondary Dressing Wound #4 Right,Lateral Foot o Foam - 3rd Dressing Change Frequency Wound #4 Right,Lateral Foot o Change dressing every week Follow-up Appointments Wound #4 Right,Lateral Foot o Return Appointment in 1 week. o Other: - Thursday for cast change Off-Loading Wound #4 Right,Lateral Foot o Total Contact Cast to Right Lower Extremity Electronic Signature(s) Signed: 07/31/2019 5:01:28 PM By: [name] PA-C Signed: 07/31/2019 6:25:55 PM By: Gretta Cool, BSN, RN, CWS, Kim RN, BSN Entered By: Gretta Cool, BSN, RN, CWS, Kim on 07/31/2019 14:15:49 [name] ([id number]) -------------------------------------------------------------------------------- Problem List Details Patient Name: NAJAE, RATHERT. Date of Service: 07/31/2019 1:30 PM Medical Record Number: [id number] Patient Account Number: [id number] Date of Birth/Sex: May 04, 1965 (54 y.o. 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 M) Treating RN: Cornell Barman Primary Care Provider: [name] Other Clinician: Referring Provider: [name] Treating Provider/Extender: Melburn Hake, Shakeitha Umbaugh Weeks in Treatment: 20 Subjective Chief Complaint Information obtained from Patient Right lateral foot ulcer History of Present Illness (HPI) This 54 year old male comes with an ulcerated area on the plantar aspect of the right foot which she's had for approximately a month. I have known him from a previous visit at [facility] wound center and was treated in the months of April and May 2016 and rapidly healed a left plantar ulcer with a total contact cast. He has been a diabetic for about 16 years and tries to keep active and is fairly compliant with his diabetes management. 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 Past medical history significant for hypertension, hyperlipidemia, and status post appendectomy 1993. He does not smoke or drink alcohol. 01/14/2015 -- the patient had tolerated his total contact cast very well and had no problems and has had no systemic symptoms. However when his total contact cast was cut open he had excessive amount of purulent drainage in spite of being on antibiotics. He had had a recent x-ray done in the ER 12/22/2014 which showed IMPRESSION:No evidence of osseous erosion. Known soft tissue ulceration is not well characterized on radiograph. Scattered vascular calcifications seen. his last hemoglobin A1c in December was 7.3. He has been on Augmentin and doxycycline for the last 2 weeks. 01/21/2015 -- his culture grew rare growth of Pantoea species an MR moderate growth of Candida parapsilosis. it is  sensitive to levofloxacin. He has not heard back from the insurance company regarding his hyperbaric oxygen therapy. His MRI has not been done yet and we will try and get him an earlier date 01/28/2015 -- MRI was done last night -- IMPRESSION:1. Soft tissue ulcer overlying the plantar aspect of the fifth metatarsal head extending to the cortex. Subcortical marrow edema in the  fifth metatarsal head with corresponding T1 hypointensity is concerning for early osteomyelitis of the plantar lateral aspect of the fifth metatarsal head. Chest x-ray done on 01/14/2015 shows bronchiectatic changes without infiltrate. EKG done on generally 17 2017 shows a normal sinus rhythm and is a normal EKG. 02/04/2015 -- he was asked to see Dr. [name] last week and had 2 appointments but had to cancel both due to pressures of work. Last night he has woken up with severe pain in the foot and leg and it is swollen up. No fever or no change in his blood glucose. Addendum: I spoke with Dr. [name] who kindly agreed to accept the patient for inpatient therapy and have also opened to the hospitalist Dr. [name], and discuss details of the management including PICC line and repeat cultures. 02/12/2015-- -- was seen by Dr. [name] in the hospital and a PICC line was placed. He was to receive Ceftazidime 2 g every 12 hourly, oral levofloxacin 750 mg every 24 hourly and oral fluconazole 200 mg daily. The antibiotics were to be given for 4 weeks except the Diflucan was to be given for the first 2 weeks. Reviewed note from 02/10/2015 -- and Dr. [name] had recommended management for growth of MSSA and Serratia. He switched him from ceftazidime to ceftriaxone 2 g every 24 hours. Levofloxacin was stopped and he would continue on fluconazole for another week. He had asked me to decide whether further imaging was necessary and whether surgical debridement of the infected bone was needed. He is doing well and has been off work  for this week and we will keep him off the next week. 02/22/2015 -- he was seen by my colleague on 01/19/2015 and at that time an incision and drainage was done on his right lateral forefoot on the dorsum. Today when I probed this wound it is frankly draining pus and it communicates with the ulcer on the plantar aspect of his right foot. The patient is still on IV ceftriaxone 2 g every 24 hours and is to be seen by Dr. [name] on Friday. 03/19/2015 -- On 03/04/2015 I spoke to Dr. 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 He recommended operative debridement which would probably result in the fifth metatarsal head and toe amputation.The patient would be referred back to [place] once he was done with surgery. He was admitted to [facility] yesterday and had surgery done by podiatry for a right fifth metatarsal acute osteomyelitis with cellulitis and abscess. He had a right foot incision and drainage with fifth metatarsal partial amputation and removal of toe infected bone and soft tissue with cultures. The wound was partially closed and packing of the distal end was done. Patient was already on cefepime 2 g IV every 8 hourly and put on vancomycin pending final cultures. He had grown moderate gram-negative rods, and later found to be rare diphtheroids. I received a call from Dr. Cannon Kettle the podiatrist and we discussed the above. On 03/10/2015 he was found positive for influenza a and has been put on Tamiflu. Since his discharge he has been seen by Dr. Cannon Kettle who is planning to remove his sutures this coming week. He was reviewed by Dr. [name] on 03/17/2015 and his Diflucan was stopped and vancomycin. After the 3 doses he is taking. He is going  to change Ceftazidime to Zosyn 3.375 g IV every 8 hours. 03/25/2015 -- he was seen by the  podiatrist a couple of days ago and the sutures were removed. She will follow back with him in 4 weeks' time and at that time x-rays will be taken and a custom molded insert would be made for his shoe. 04/01/2015 -- he was seen by Dr. [name] on 03/29/2015 who pulled the PICC line stopped his IV antibiotics and recommended starting doxycycline and levofloxacin for 2 weeks. He also stop the fluconazole. The patient will follow up with him only when necessary. [name], [name] ([id number]) Readmission: 03/15/17 on evaluation today patient presents for initial evaluation concerning the new issues although he has previously been evaluated in our clinic. Unfortunately the previous evaluation led to the patient having to proceed to amputation and so he is somewhat nervous about being here today. With that being said he has a very slight blister that occurred on the plantar aspect more medial on the right great toe that has been present for just a very short amount of time, several days. With that being said he felt like initially when he called that he somewhat overreacted but due to the fact that his previous issue led to amputation he is very cautious these days I explained that he did the right thing. With that being said he has been tolerating the dressing changes without complication mainly he just been covering this. He does not have any discomfort in secondary to neuropathy it's unlikely that he feels much. With that being said he does tell me that the issue that he had here is that he went barefoot when he knows he should not have which subsequently led to the blisters. He states is definitely not doing that anymore. His most recent hemoglobin A1c was December and registered 6.9 his ABI today was 1.1. 03/27/17 on evaluation today patient appears to be doing great in regard to his right great toe ulcer. He has been tolerating the dressing changes without complication. The good news is this is  making excellent progress and he seems to be caring for this in an excellent fashion. I see no evidence of breakdown that would make me concerned that he was at risk for infection/amputation. This has obviously been his concern due to the fifth toe amputation that was necessitated previous when he had a similar issue. Nonetheless this seems to be progressing much more nicely. Readmission: 03/13/2019 upon evaluation today patient presents for reevaluation here in the clinic concerning issues he has been having with his right foot on the lateral portion at the proximal end of the metatarsal. Subsequently he tells me that this just opened up in the past couple of days or so and the erythema really began about 24 to 48 hours ago. Fortunately there is no signs of systemic infection. 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 That left him with this bony prominence that is where the wound is currently. There is erythema surrounding that does have me concerned about cellulitis. With that being said he was noncompressible as far as ABIs are concerned I do think we can need to check into arterial studies at this point. Patient does have a history of hypertension along with diabetes mellitus type 2. He also tends to develop quite a bit of callus white often. 03/20/2019 upon evaluation today patient appears to be doing very well in regard to his wounds currently. He  has been tolerating the dressing changes without complication. Fortunately there is no signs of infection. His ABIs were good at this point and registered at 1.07 on the left and 1.09 on the right. In regard to the x-ray this was negative as well for any signs of osteomyelitis. The patient also seems to be doing better in regard to the infection. He still has several days of the antibiotic left at this point best the Bactrim DS and he seems to be doing very well with this. Overall I am extremely pleased  with the progress in 1 week's time he is going require some sharp debridement today however. 03/27/2019 upon evaluation today patient actually appears to be making some progress in regard to his wound. It is a little deeper but measuring smaller which is good news due to the fact that again were clear away some of the necrotic tissue which is why the depth is increasing a little bit. Nonetheless after like were getting very close to being down at a good wound bed. Fortunately there is no signs of active infection at this time. There is a little bit of erythema immediately surrounding the wound that we do want to be very cognizant of and careful about. For that reason I am going to go ahead and extend his antibiotic today for an additional 10 days that is the Bactrim. 04/03/2019 upon evaluation today patient appears to be making some progress. I do feel like the Annitta Needs is doing a good job along with the alginate is being packed into the wound space behind. He has been tolerating the dressing changes without complication. Fortunately there is no signs of active infection at this time. No fevers, chills, nausea, vomiting, or diarrhea. 04/10/2019 upon evaluation today patient appears to be making progress. The wound is not quite as deep but he still does not have a great wound surface yet for working on this and the Santyl does seem to be cleaning things up. Fortunately there is no evidence of active infection at this time. No fevers, chills, nausea, vomiting, or diarrhea. 04/17/2019 upon evaluation today patient appears to be doing excellent at this time in regard to his foot ulcer. I do believe the Santyl with the alginate packed in behind has been of benefit for him and I am very pleased in this regard. With that being said I am feeling like the wound surface is improving quite a bit each time I see him still I do not believe we are at the point of stating that the wound bed is perfect but again were making  progress. The patient is going require some sharp debridement today. 4/22; this is a patient who is using Santyl with calcium alginate. Apparently his wound dimensions have been improving. Patient is changing the dressing himself. He has a surgical shoe. He works in a sitting position therefore is not on his feet all that much. There is undermining 05/01/2019 upon evaluation today patient appears to be doing a little better in regard to the size of his wound though he still has some depth. This does seem to be much cleaner has been using Santyl on the base of the wound followed by packing with silver alginate and behind. Nonetheless I do believe that based on what we are seeing we may need to switch to a collagen type dressing endoform may actually be excellent for him. 05/08/2019 upon evaluation today patient's wound actually appears to be showing better granulation tissue in the base of the  wound. With that being said he does have some epiboly noted around the edges of the wound especially plantar and more distal. Subsequently this is can require sharp debridement today. Fortunately there is no signs of active systemic infection though I do feel like there is some local cellulitis noted at this point. The patient also notes has had increased drainage. 5/13; wound volume quite a bit improved this week down to 3 mm. Patient states pain and drainage better. Culture from last week grew staph aureus [not MRSA] and this is sensitive to quinolones and he is on Levaquin. HOWEVER he also grew abundant Enterococcus faecalis treatment of choice for this is ampicillin. Quinolone coverage is unreliable 5/20 completing the Augmentin. Small wound on the right lateral foot. Thick rolled edges around the wound. Using silver alginate 05/29/2019 upon evaluation today patient appears to be doing better compared to last time I saw him. Fortunately there is no signs of active infection and I do feel like he is actually  making good progress. Overall the quality of the wound bed along with the size looks improved and the inflammation/erythema that was previously noted by myself when I saw the couple weeks back actually has also resolved and this is great. Overall very pleased with how things stand. 06/06/2019 upon evaluation today patient actually appears to be doing quite well with regard to his foot ulcer with regard to some of the granulation were seen at this point. There does not appear to be any evidence of systemic or local infection although he still has discomfort I feel like things are moving in a slow but surely correct direction. I think we may need to help fill some of the space however even though he is using the collagen we may need to have something behind this such as a plain packing strip to try to help hold the collagen in the base of the wound. 06/12/2019 upon evaluation today patient appears to be doing well with regard to his foot ulcer. This seems to be making some slow but steady progress. The wound does not appear to be as deep today compared to where it was previous. Fortunately there is no signs of active infection at [name], [name]. ([id number]) this time. No fevers, chills, nausea, vomiting, or diarrhea. 06/19/2019 on evaluation today patient's wound does not appear to be doing quite as well as what I would like to see. Fortunately there is no signs of systemic infection at this time which is great news. With that being said I do believe that the patient needs to have a wound culture as I do believe he likely has a local infection that needs to be addressed. He is also can require some debridement today as well. 06/26/2019 upon evaluation today patient's wound actually appears to be showing signs of excellent improvement today. There does not appear to be any evidence of active infection which is great news. There does not appear to be significant erythema at this point. The patient does  have improvement overall in the appearance of the infection he has been taking the Augmentin the culture came back showing no organism was grown at this point but nonetheless I do believe he has been doing well with the antibiotics I would continue that at this point. 07/03/2019 upon evaluation today patient appears to be doing about the same in regard to his wound. There does not appear to be any evidence of active infection and overall I am pleased with where things stand. The  patient is tolerating the dressing changes without complication but it does not appear that they are packing the wound at this time which I think is going to slow down his healing prospects. 07/10/2019 upon evaluation today patient appears to be doing little better in my opinion in regard to the overall moisture collected in the base of the wound. They have been packing this with a little bit of silver alginate and that has done quite well. Overall I am pleased in that regard. Subsequently the redness does not appear to be any worse today I did actually go ahead and marked this since this can be 2 weeks between now and when I see him again I would make sure that if anything changes or worsens he will be able to notice this and go ahead and initiate treatment with the Augmentin. 07/22/2019 patient comes in today a little bit early for evaluation secondary to the fact that he was having issues with increased drainage and concern about infection. With that being said he does appear to have developed a blister area again proximal to where the original wound opening was in although the wound bed itself actually appears to be doing somewhat better he has had increased drainage and tracking of fluid here due to this blister. Nonetheless I believe that that does need to be cleaned away today but again I am not really certain that I see a lot of evidence for infection at this point. 07/29/2019 on evaluation today patient actually appears to  be doing better in regard to his foot ulcer. There is improvement compared to last week which is great news. Nonetheless he does come prepared to apply the total contact cast today which I think would be beneficial for him. He knows he is not supposed to drive he did bring his son with him today in order to drive him. 07/31/2019 upon evaluation today patient appears to be doing well with regard to his wound today. I do not see any signs of infection I do believe that he is definitely showing some signs of improvement here in the office in a couple days as far as the overall erythema and pressure getting to the wound area is concerned. With that being said I do believe that the cast has been of great benefit for him. Objective Constitutional Well-nourished and well-hydrated in no acute distress. Vitals Time Taken: 1:40 PM, Height: 74 in, Weight: 308 lbs, BMI: 39.5, Temperature: 98.5 F, Pulse: 70 bpm, Respiratory Rate: 18 breaths/min, Blood Pressure: 155/72 mmHg. Respiratory normal breathing without difficulty. Psychiatric this patient is able to make decisions and demonstrates good insight into disease process. Alert and Oriented x 3. pleasant and cooperative. General Notes: Patient's wound bed currently did not require any sharp debridement which is great news there is overall no signs of active infection also excellent news. In general I am extremely pleased with where things stand today. I think the cast is doing him good at this point. Integumentary (Hair, Skin) Wound #4 status is Open. Original cause of wound was Gradually Appeared. The wound is located on the Right,Lateral Foot. The wound measures 0.6cm length x 0.5cm width x 0.6cm depth; 0.236cm^2 area and 0.141cm^3 volume. There is Fat Layer (Subcutaneous Tissue) Exposed exposed. There is no tunneling or undermining noted. There is a medium amount of serous drainage noted. The wound margin is flat and intact. There is medium (34-66%)  pink granulation within the wound bed. There is a medium (34-66%) amount of necrotic tissue  within the wound bed including Adherent Slough. Assessment Active Problems ICD-10 [name], [name] ([id number]) Type 2 diabetes mellitus with foot ulcer 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d Cellulitis of right lower limb Corns and callosities Essential (primary) hypertension Acquired absence of other left toe(s) Procedures Wound #4 Pre-procedure diagnosis of Wound #4 is a Diabetic Wound/Ulcer of the Lower Extremity located on the Right,Lateral Foot . There was a Total Contact Cast Procedure by STONE III, Chinedum Vanhouten E., PA-C. Post procedure Diagnosis Wound #4: Same as Pre-Procedure Plan Wound Cleansing: Wound #4 Right,Lateral Foot: Clean wound with Normal Saline. - in office Dial antibacterial soap, wash wounds, rinse and pat dry prior to dressing wounds Anesthetic (add to Medication List): Wound #4 Right,Lateral Foot: Topical Lidocaine 4% cream applied to wound bed prior to debridement (In Clinic Only). Primary Wound Dressing: Wound #4 Right,Lateral Foot: Alginate - 2nd Silver Collagen - 1st Secondary Dressing: Wound #4 Right,Lateral Foot: Foam - 3rd Dressing Change Frequency: Wound #4 Right,Lateral Foot: Change dressing every week Follow-up Appointments: Wound #4 Right,Lateral Foot: Return Appointment in 1 week. Other: - Thursday for cast change Off-Loading: Wound #4 Right,Lateral Foot: Total Contact Cast to Right Lower Extremity 1. I would recommend currently that we going to continue with the wound care measures as before with the total contact cast. We will apply silver collagen to the base of the wound and then subsequently we will apply silver alginate over top of this. That is to help catch any drainage that can I think. Nonetheless we will use a plain alginate not silver for the top layer. 2. If he has any trouble out of the cast he  will contact the office let me know otherwise we will plan to see him next Friday to switch this out. We will see patient back for reevaluation in 1 week here in the clinic. If anything worsens or changes patient will contact our office for additional recommendations. Electronic Signature(s) Signed: 07/31/2019 2:17:48 PM By: [name] PA-C Entered By: [name] on 07/31/2019 14:17:48 [name] ([id number]) -------------------------------------------------------------------------------- Total Contact Cast Details Patient Name: SEAB, AXEL. Date of Service: 07/31/2019 1:30 PM Medical Record Number: [id number] Patient Account Number: [id number] Date of Birth/Sex: Jul 02, 1965 (54 y.o. M) Treating RN: Cornell Barman Primary Care Provider: [name] Other Clinician: Referring Provider: [name] Treating Provider/Extender: Melburn Hake, [name] Disney Weeks in Treatment: 20 Total Contact Cast Applied for Wound Assessment: Wound #4 Right,Lateral Foot Performed By: Physician Emilio Math., PA-C Post Procedure Diagnosis Same as Pre-procedure Electronic Signature(s) Signed: 07/31/2019 5:01:28 PM By: [name] PA-C Signed: 07/31/2019 6:25:55 PM By: Gretta Cool, BSN, RN, CWS, Kim RN, BSN Entered By: Gretta Cool, BSN, RN, CWS, Kim on 07/31/2019 14:11:46 [name] ([id number]) -------------------------------------------------------------------------------- SuperBill Details Patient Name: [name] Date of Service: 07/31/2019 Medical Record Number: [id number] Patient Account Number: [id number] Date of Birth/Sex: [birth year] (54 y.o. M) Treating RN: Cornell Barman Primary Care Provider: [name] Other Clinician: Referring Provider: [name] Treating Provider/Extender: Melburn Hake, Frankie Zito Weeks in Treatment: 20 Diagnosis Coding ICD-10 Codes Code Description E11.621 Type 2 diabetes mellitus with foot ulcer L97.512 Non-pressure chronic ulcer of  other part of right foot with fat layer exposed L03.115 Cellulitis of right lower limb L84 Corns and callosities I10 Essential (primary) hypertension Z89.422 Acquired absence of other left toe(s) Facility Procedures CPT4 Code: 54627035 Description: 00938 - APPLY TOTAL CONTACT LEG CAST Modifier: Quantity: 1 CPT4 Code: Description: ICD-10 Diagnosis Description  E11.621 Type 2 diabetes mellitus with foot ulcer Modifier: Quantity: Physician Procedures CPT4 Code: 6619694 Description: 09828 - WC PHYS APPLY TOTAL CONTACT CAST Modifier: Quantity: 1 CPT4 Code: Description: ICD-10 Diagnosis Description E11.621 Type 2 diabetes mellitus with foot ulcer Modifier: Quantity: Electronic Signature(s) Signed: 07/31/2019 2:18:00 PM By: [name] PA-C Entered By: [name] on 07/31/2019 14:17:59

## 2019-07-31 NOTE — Progress Notes (Addendum)
[name], [name] ([id number]) Visit Report for 07/31/2019 Arrival Information Details Patient Name: [name], [name]. Date of Service: 07/31/2019 1:30 PM Medical Record Number: [id number] Patient Account Number: [id number] Date of Birth/Sex: 07/24/1965 (54 y.o. M) Treating RN: [name] Primary Care [name]: [name] Other Clinician: Referring [name]: [name] Treating [name]/Extender: [name], [name] Weeks in Treatment: 20 Visit Information History Since Last Visit Added or deleted any medications: No Patient Arrived: Ambulatory Had a fall or experienced change in No Arrival Time: 13:44 activities of daily living that may affect Accompanied By: self risk of falls: Transfer Assistance: None Hospitalized since last visit: No Patient Identification Verified: Yes Pain Present Now: No Secondary Verification Process Completed: Yes Patient Requires Transmission-Based Precautions: No Patient Has Alerts: Yes Patient Alerts: DMII Electronic Signature(s) Signed: 07/31/2019 4:58:49 PM By: [name] Entered By: [name] on 07/31/2019 13:44:32 [name] ([id number]) -------------------------------------------------------------------------------- Lower Extremity Assessment Details Patient Name: [name] Date of Service: 07/31/2019 1:30 PM Medical Record Number: [id number] Patient Account Number: [id number] Date of Birth/Sex: 1965-05-07 (54 y.o. M) Treating RN: [name] Primary Care Neira Bentsen: [name] Other Clinician: Referring Omair Dettmer: [name] Treating Mishael Krysiak/Extender: [name], [name] Weeks in Treatment: 20 Edema Assessment Assessed: [Left: No] [Right: Yes] Edema: [Left: N] [Right: o] Vascular Assessment Pulses: Dorsalis Pedis Palpable: [Right:Yes] Posterior Tibial Palpable: [Right:Yes] Electronic Signature(s) Signed: 07/31/2019 4:58:49 PM By: [name] Entered By: [name] on 07/31/2019 14:01:21 [name] ([id number]) -------------------------------------------------------------------------------- Multi Wound Chart Details Patient Name: [name] Date of Service: 07/31/2019 1:30 PM Medical Record Number: [id number] Patient Account Number: [id number] Date of Birth/Sex: [birth year] (54 y.o. M) Treating RN: [name] Primary Care Seraphine Gudiel: [name] Other Clinician: Referring Jozy Mcphearson: [name] Treating Tawan Corkern/Extender: [name], [name] Weeks in Treatment: 20 Vital Signs Height(in): 74 Pulse(bpm): 70 Weight(lbs): 308 Blood Pressure(mmHg): 155/72 Body Mass Index(BMI): 40 Temperature(F): 98.5 Respiratory Rate(breaths/min): 18 Photos: [N/A:N/A] Wound Location: Right, Lateral Foot N/A N/A Wounding Event: Gradually Appeared N/A N/A Primary Etiology: Diabetic Wound/Ulcer of the Lower N/A N/A Extremity Comorbid History: Hypertension, Type II Diabetes, N/A N/A Neuropathy Date Acquired: 03/12/2019 N/A N/A Weeks of Treatment: 20 N/A N/A Wound Status: Open N/A N/A Pending Amputation on Yes N/A N/A Presentation: Measurements L x W x D (cm) 0.7x0.8x0.8 N/A N/A Area (cm) : 0.44 N/A N/A Volume (cm) : 0.352 N/A N/A % Reduction in Area: 30.80% N/A N/A % Reduction in Volume: -177.20% N/A N/A Classification: Grade 1 N/A N/A Exudate Amount: Medium N/A N/A Exudate Type: Serous N/A N/A Exudate Color: amber N/A N/A Wound Margin: Flat and Intact N/A N/A Granulation Amount: Medium (34-66%) N/A N/A Granulation Quality: Pink N/A N/A Necrotic Amount: Medium (34-66%) N/A N/A Exposed Structures: Fat Layer (Subcutaneous Tissue) N/A N/A Exposed: Yes Fascia: No Tendon: No Muscle: No Joint: No Bone: No Epithelialization: Small (1-33%) N/A N/A Treatment Notes Electronic Signature(s) Signed: 07/31/2019 6:25:55 PM By: Gretta Cool, BSN, RN, CWS, Kim RN, BSN Entered By: Gretta Cool, BSN, RN, CWS, Kim on 07/31/2019 14:10:20 [name], [name] ([id number]) [name], [name] ([id number]) -------------------------------------------------------------------------------- [place] Details Patient Name: [name], LAL. Date of Service: 07/31/2019 1:30 PM Medical Record Number: [id number] Patient Account Number: [id number] Date of Birth/Sex: 02-13-1965 (54 y.o. M) Treating RN: [name] Primary Care Coron Rossano: [name] Other Clinician: Referring Salbador Fiveash: [name] Treating Tiburcio Linder/Extender: [name], [name] Weeks in Treatment: 20 Active Inactive Nutrition Nursing Diagnoses: Impaired glucose control: actual or potential Goals: Patient/caregiver verbalizes understanding of need to maintain therapeutic glucose  control per primary care physician Date Initiated: 03/13/2019 Target Resolution Date: 04/11/2019 Goal Status: Active Interventions: Assess patient nutrition upon admission and as needed per policy Notes: Orientation to the Wound Care Program Nursing Diagnoses: Knowledge deficit related to the wound healing center program Goals: Patient/caregiver will verbalize understanding of the [facility] Date Initiated: 03/13/2019 Target Resolution Date: 04/11/2019 Goal Status: Active Interventions: Provide education on orientation to the wound center Notes: Wound/Skin Impairment Nursing Diagnoses: Impaired tissue integrity Goals: Ulcer/skin breakdown will have a volume reduction of 30% by week 4 Date Initiated: 03/13/2019 Target Resolution Date: 04/11/2019 Goal Status: Active Interventions: Assess ulceration(s) every visit Notes: Electronic Signature(s) Signed: 07/31/2019 6:25:55 PM By: Gretta Cool, BSN, RN, CWS, Kim RN, BSN Entered By: Gretta Cool, BSN, RN, CWS, Kim on 07/31/2019 14:10:13 [name] ([id number]) -------------------------------------------------------------------------------- Pain Assessment Details Patient Name: [name] Date of  Service: 07/31/2019 1:30 PM Medical Record Number: [id number] Patient Account Number: [id number] Date of Birth/Sex: 02/14/1965 (54 y.o. M) Treating RN: [name] Primary Care Madhavi Hamblen: [name] Other Clinician: Referring Glinda Natzke: [name] Treating Cj Beecher/Extender: [name], [name] Weeks in Treatment: 20 Active Problems Location of Pain Severity and Description of Pain Patient Has Paino No Site Locations Pain Management and Medication Current Pain Management: Electronic Signature(s) Signed: 07/31/2019 4:58:49 PM By: [name] Entered By: [name] on 07/31/2019 13:45:26 [name] ([id number]) -------------------------------------------------------------------------------- Patient/Caregiver Education Details Patient Name: [name] Date of Service: 07/31/2019 1:30 PM Medical Record Number: [id number] Patient Account Number: [id number] Date of Birth/Gender: 06-30-1965 (54 y.o. M) Treating RN: [name] Primary Care Physician: [name] Other Clinician: Referring Physician: [name] Treating Physician/Extender: Sharalyn Ink in Treatment: 20 Education Assessment Education Provided To: Patient Education Topics Provided Offloading: Handouts: What is Offloadingo Methods: Demonstration, Explain/Verbal Responses: State content correctly Electronic Signature(s) Signed: 07/31/2019 6:25:55 PM By: Gretta Cool, BSN, RN, CWS, Kim RN, BSN Entered By: Gretta Cool, BSN, RN, CWS, Kim on 07/31/2019 14:17:51 [name] ([id number]) -------------------------------------------------------------------------------- Wound Assessment Details Patient Name: [name], HILBUN. Date of Service: 07/31/2019 1:30 PM Medical Record Number: [id number] Patient Account Number: [id number] Date of Birth/Sex: Nov 22, 1965 (54 y.o. M) Treating RN: [name] Primary Care Karita Dralle: [name] Other Clinician: Referring Jesten Cappuccio:  [name] Treating Judiann Celia/Extender: [name], [name] Weeks in Treatment: 20 Wound Status Wound Number: 4 Primary Etiology: Diabetic Wound/Ulcer of the Lower Extremity Wound Location: Right, Lateral Foot Wound Status: Open Wounding Event: Gradually Appeared Comorbid History: Hypertension, Type II Diabetes, Neuropathy Date Acquired: 03/12/2019 Weeks Of Treatment: 20 Clustered Wound: No Pending Amputation On Presentation Photos Wound Measurements Length: (cm) 0.6 % Redu Width: (cm) 0.5 % Redu Depth: (cm) 0.6 Epithe Area: (cm) 0.236 [place] Volume: (cm) 0.141 Under ction in Area: 62.9% ction in Volume: -11% lialization: Small (1-33%) ling: No mining: No Wound Description Classification: Grade 1 Foul O Wound Margin: Flat and Intact Slough Exudate Amount: Medium Exudate Type: Serous Exudate Color: amber dor After Cleansing: No /Fibrino Yes Wound Bed Granulation Amount: Medium (34-66%) Exposed Structure Granulation Quality: Pink Fascia Exposed: No Necrotic Amount: Medium (34-66%) Fat Layer (Subcutaneous Tissue) Exposed: Yes Necrotic Quality: Adherent Slough Tendon Exposed: No Muscle Exposed: No Joint Exposed: No Bone Exposed: No Treatment Notes Wound #4 (Right, Lateral Foot) Notes Prisma, scell, foam ,TCC right [name], [name] ([id number]) Electronic Signature(s) Signed: 07/31/2019 6:25:55 PM By: Gretta Cool, BSN, RN, CWS, Kim RN, BSN Entered By: Gretta Cool, BSN, RN, CWS, Kim on 07/31/2019 14:11:59 [name] ([id number]) -------------------------------------------------------------------------------- Vitals Details Patient Name: [name],  [name] J. Date of Service: 07/31/2019 1:30 PM Medical Record Number: [id number] Patient Account Number: [id number] Date of Birth/Sex: [birth year] (54 y.o. M) Treating RN: [name] Primary Care Biruk Troia: [name] Other Clinician: Referring Truc Winfree: [name] Treating Vinnie Bobst/Extender: [name], [name] Weeks in Treatment: 20 Vital Signs Time Taken: 13:40 Temperature (F): 98.5 Height (in): 74 Pulse (bpm): 70 Weight (lbs): 308 Respiratory Rate (breaths/min): 18 Body Mass Index (BMI): 39.5 Blood Pressure (mmHg): 155/72 Reference Range: 80 - 120 mg / dl Electronic Signature(s) Signed: 07/31/2019 4:58:49 PM By: [name] Entered By: [name] on 07/31/2019 13:45:12

## 2019-08-08 ENCOUNTER — Encounter: Payer: Managed Care, Other (non HMO) | Attending: Physician Assistant | Admitting: Physician Assistant

## 2019-08-08 ENCOUNTER — Other Ambulatory Visit: Payer: Self-pay

## 2019-08-08 DIAGNOSIS — Z89422 Acquired absence of other left toe(s): Secondary | ICD-10-CM | POA: Insufficient documentation

## 2019-08-08 DIAGNOSIS — E11621 Type 2 diabetes mellitus with foot ulcer: Secondary | ICD-10-CM | POA: Diagnosis not present

## 2019-08-08 DIAGNOSIS — I10 Essential (primary) hypertension: Secondary | ICD-10-CM | POA: Insufficient documentation

## 2019-08-08 DIAGNOSIS — L97512 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d: Secondary | ICD-10-CM | POA: Insufficient documentation

## 2019-08-08 DIAGNOSIS — L84 Corns and callosities: Secondary | ICD-10-CM | POA: Diagnosis not present

## 2019-08-08 DIAGNOSIS — E1140 Type 2 diabetes mellitus with diabetic neuropathy, unspecified: Secondary | ICD-10-CM | POA: Diagnosis not present

## 2019-08-08 DIAGNOSIS — L03115 Cellulitis of right lower limb: Secondary | ICD-10-CM | POA: Diagnosis not present

## 2019-08-08 NOTE — Progress Notes (Addendum)
HAYS, DUNNIGAN ([id number]) Visit Report for 08/08/2019 Chief Complaint Document Details Patient Name: [name], [name]. Date of Service: 08/08/2019 8:00 AM Medical Record Number: [id number] Patient Account Number: [id number] Date of Birth/Sex: November 12, 1965 (54 y.o. M) Treating RN: Cornell Barman Primary Care Provider: [name] Other Clinician: Referring Provider: [name] Treating Provider/Extender: Melburn Hake, Doniven Vanpatten Weeks in Treatment: 21 Information Obtained from: Patient Chief Complaint Right lateral foot ulcer Electronic Signature(s) Signed: 08/08/2019 8:25:06 AM By: [name] PA-C Entered By: [name] on 08/08/2019 08:25:06 [name] ([id number]) -------------------------------------------------------------------------------- Debridement Details Patient Name: [name] Date of Service: 08/08/2019 8:00 AM Medical Record Number: [id number] Patient Account Number: [id number] Date of Birth/Sex: 12-19-1965 (54 y.o. M) Treating RN: [name] Primary Care Provider: [name] Other Clinician: Referring Provider: [name] Treating Provider/Extender: Melburn Hake, Ericia Moxley Weeks in Treatment: 21 Debridement Performed for Wound #4 Right,Lateral Foot Assessment: Performed By: Physician STONE III, Ramaj Frangos E., PA-C Debridement Type: Debridement Severity of Tissue Pre Debridement: Fat layer exposed Level of Consciousness (Pre- Awake and Alert procedure): Pre-procedure Verification/Time Out Yes - 08:20 Taken: Start Time: 08:27 Pain Control: Lidocaine Total Area Debrided (L x W): 0.6 (cm) x 0.5 (cm) = 0.3 (cm) Tissue and other material Viable, Non-Viable, Callus, Slough, Subcutaneous, Slough debrided: Level: Skin/Subcutaneous Tissue Debridement Description: Excisional Instrument: Curette Bleeding: Minimum Hemostasis Achieved: Pressure End Time: 08:30 Response to Treatment: Procedure was tolerated well Level of  Consciousness (Post- Awake and Alert procedure): Post Debridement Measurements of Total Wound Length: (cm) 0.6 Width: (cm) 0.5 Depth: (cm) 0.5 Volume: (cm) 0.118 Character of Wound/Ulcer Post Debridement: Stable Severity of Tissue Post Debridement: Fat layer exposed Post Procedure Diagnosis Same as Pre-procedure Electronic Signature(s) Signed: 08/08/2019 11:33:15 AM By: [name] Signed: 08/08/2019 5:17:08 PM By: [name] PA-C Entered By: [name] on 08/08/2019 08:28:57 [name] ([id number]) -------------------------------------------------------------------------------- HPI Details Patient Name: [name] Date of Service: 08/08/2019 8:00 AM Medical Record Number: [id number] Patient Account Number: [id number] Date of Birth/Sex: 1965-02-16 (54 y.o. M) Treating RN: Cornell Barman Primary Care Provider: [name] Other Clinician: Referring Provider: [name] Treating Provider/Extender: Melburn Hake, Evanee Lubrano Weeks in Treatment: 21 History of Present Illness HPI Description: This 54 year old male comes with an ulcerated area on the plantar aspect of the right foot which she's had for approximately a month. I have known him from a previous visit at [facility] wound [facility] and was treated in the months of April and May 2016 and rapidly healed a left plantar ulcer with a total contact cast. He has been a diabetic for about 16 years and tries to keep active and is fairly compliant with his diabetes management. 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 Past medical history significant for hypertension, hyperlipidemia, and status post appendectomy 1993. He does not smoke or drink alcohol. 01/14/2015 -- the patient had tolerated his total contact cast very well and had no problems and has had no systemic symptoms. However when his total contact cast was cut open he had excessive amount of purulent drainage in spite of being on antibiotics. He had had a  recent x-ray done in the ER 12/22/2014 which showed IMPRESSION:No evidence of osseous erosion. Known soft tissue ulceration is not well characterized on radiograph. Scattered vascular calcifications seen. his last hemoglobin A1c in December was 7.3. He has been on Augmentin and doxycycline for the last 2 weeks. 01/21/2015 -- his culture grew rare growth of Pantoea species an MR moderate growth of Candida parapsilosis. it  is sensitive to levofloxacin. He has not heard back from the insurance company regarding his hyperbaric oxygen therapy. His MRI has not been done yet and we will try and get him an earlier date 01/28/2015 -- MRI was done last night -- IMPRESSION:1. Soft tissue ulcer overlying the plantar aspect of the fifth metatarsal head extending to the cortex. Subcortical marrow edema in the fifth metatarsal head with corresponding T1 hypointensity is concerning for early osteomyelitis of the plantar lateral aspect of the fifth metatarsal head. Chest x-ray done on 01/14/2015 shows bronchiectatic changes without infiltrate. EKG done on generally 17 2017 shows a normal sinus rhythm and is a normal EKG. 02/04/2015 -- he was asked to see Dr. [name] last week and had 2 appointments but had to cancel both due to pressures of work. Last night he has woken up with severe pain in the foot and leg and it is swollen up. No fever or no change in his blood glucose. Addendum: I spoke with Dr. [name] who kindly agreed to accept the patient for inpatient therapy and have also opened to the hospitalist Dr. [name], and discuss details of the management including PICC line and repeat cultures. 02/12/2015-- -- was seen by Dr. [name] in the hospital and a PICC line was placed. He was to receive Ceftazidime 2 g every 12 hourly, oral levofloxacin 750 mg every 24 hourly and oral fluconazole 200 mg daily. The antibiotics were to be given for 4 weeks except the Diflucan was to be given for the first 2  weeks. Reviewed note from 02/10/2015 -- and Dr. [name] had recommended management for growth of MSSA and Serratia. He switched him from ceftazidime to ceftriaxone 2 g every 24 hours. Levofloxacin was stopped and he would continue on fluconazole for another week. He had asked me to decide whether further imaging was necessary and whether surgical debridement of the infected bone was needed. He is doing well and has been off work for this week and we will keep him off the next week. 02/22/2015 -- he was seen by my colleague on 01/19/2015 and at that time an incision and drainage was done on his right lateral forefoot on the dorsum. Today when I probed this wound it is frankly draining pus and it communicates with the ulcer on the plantar aspect of his right foot. The patient is still on IV ceftriaxone 2 g every 24 hours and is to be seen by Dr. [name] on Friday. 03/19/2015 -- On 03/04/2015 I spoke to Dr. 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 He recommended operative debridement which would probably result in the fifth metatarsal head and toe amputation.The patient would be referred back to [place] once he was done with surgery. He was admitted to [facility] yesterday and had surgery done by podiatry for a right fifth metatarsal acute osteomyelitis with cellulitis and abscess. He had a right foot incision and drainage with fifth metatarsal partial amputation and removal of toe infected bone and soft tissue with cultures. The wound was partially closed and packing of the distal end was done. Patient was already on cefepime 2 g IV every 8 hourly and put on vancomycin pending final cultures. He had grown moderate gram-negative rods, and later found to be rare diphtheroids. I received a call from Dr. Cannon Kettle the podiatrist  and we  discussed the above. On 03/10/2015 he was found positive for influenza a and has been put on Tamiflu. Since his discharge he has been seen by Dr. Cannon Kettle who is planning to remove his sutures this coming week. He was reviewed by Dr. [name] on 03/17/2015 and his Diflucan was stopped and vancomycin. After the 3 doses he is taking. He is going to change Ceftazidime to Zosyn 3.375 g IV every 8 hours. 03/25/2015 -- he was seen by the podiatrist a couple of days ago and the sutures were removed. She will follow back with him in 4 weeks' time and at that time x-rays will be taken and a custom molded insert would be made for his shoe. 04/01/2015 -- he was seen by Dr. [name] on 03/29/2015 who pulled the PICC line stopped his IV antibiotics and recommended starting doxycycline and levofloxacin for 2 weeks. He also stop the fluconazole. The patient will follow up with him only when necessary. Readmission: 03/15/17 on evaluation today patient presents for initial evaluation concerning the new issues although he has previously been evaluated in our clinic. Unfortunately the previous evaluation led to the patient having to proceed to amputation and so he is somewhat nervous about being here ALVIS, EDGELL ([id number]) today. With that being said he has a very slight blister that occurred on the plantar aspect more medial on the right great toe that has been present for just a very short amount of time, several days. With that being said he felt like initially when he called that he somewhat overreacted but due to the fact that his previous issue led to amputation he is very cautious these days I explained that he did the right thing. With that being said he has been tolerating the dressing changes without complication mainly he just been covering this. He does not have any discomfort in secondary to neuropathy it's unlikely that he feels much. With that being said he does tell me that the issue that  he had here is that he went barefoot when he knows he should not have which subsequently led to the blisters. He states is definitely not doing that anymore. His most recent hemoglobin A1c was December and registered 6.9 his ABI today was 1.1. 03/27/17 on evaluation today patient appears to be doing great in regard to his right great toe ulcer. He has been tolerating the dressing changes without complication. The good news is this is making excellent progress and he seems to be caring for this in an excellent fashion. I see no evidence of breakdown that would make me concerned that he was at risk for infection/amputation. This has obviously been his concern due to the fifth toe amputation that was necessitated previous when he had a similar issue. Nonetheless this seems to be progressing much more nicely. Readmission: 03/13/2019 upon evaluation today patient presents for reevaluation here in the clinic concerning issues he has been having with his right foot on the lateral portion at the proximal end of the metatarsal. Subsequently he tells me that this just opened up in the past couple of days or so and the erythema really began about 24 to 48 hours ago. Fortunately there is no signs of systemic infection. 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 That left him with this bony prominence that is where the wound is currently. There is erythema surrounding that does have me concerned about cellulitis. With that being said he  was noncompressible as far as ABIs are concerned I do think we can need to check into arterial studies at this point. Patient does have a history of hypertension along with diabetes mellitus type 2. He also tends to develop quite a bit of callus white often. 03/20/2019 upon evaluation today patient appears to be doing very well in regard to his wounds currently. He has been tolerating the dressing changes without complication.  Fortunately there is no signs of infection. His ABIs were good at this point and registered at 1.07 on the left and 1.09 on the right. In regard to the x-ray this was negative as well for any signs of osteomyelitis. The patient also seems to be doing better in regard to the infection. He still has several days of the antibiotic left at this point best the Bactrim DS and he seems to be doing very well with this. Overall I am extremely pleased with the progress in 1 week's time he is going require some sharp debridement today however. 03/27/2019 upon evaluation today patient actually appears to be making some progress in regard to his wound. It is a little deeper but measuring smaller which is good news due to the fact that again were clear away some of the necrotic tissue which is why the depth is increasing a little bit. Nonetheless after like were getting very close to being down at a good wound bed. Fortunately there is no signs of active infection at this time. There is a little bit of erythema immediately surrounding the wound that we do want to be very cognizant of and careful about. For that reason I am going to go ahead and extend his antibiotic today for an additional 10 days that is the Bactrim. 04/03/2019 upon evaluation today patient appears to be making some progress. I do feel like the Annitta Needs is doing a good job along with the alginate is being packed into the wound space behind. He has been tolerating the dressing changes without complication. Fortunately there is no signs of active infection at this time. No fevers, chills, nausea, vomiting, or diarrhea. 04/10/2019 upon evaluation today patient appears to be making progress. The wound is not quite as deep but he still does not have a great wound surface yet for working on this and the Santyl does seem to be cleaning things up. Fortunately there is no evidence of active infection at this time. No fevers, chills, nausea, vomiting, or  diarrhea. 04/17/2019 upon evaluation today patient appears to be doing excellent at this time in regard to his foot ulcer. I do believe the Santyl with the alginate packed in behind has been of benefit for him and I am very pleased in this regard. With that being said I am feeling like the wound surface is improving quite a bit each time I see him still I do not believe we are at the point of stating that the wound bed is perfect but again were making progress. The patient is going require some sharp debridement today. 4/22; this is a patient who is using Santyl with calcium alginate. Apparently his wound dimensions have been improving. Patient is changing the dressing himself. He has a surgical shoe. He works in a sitting position therefore is not on his feet all that much. There is undermining 05/01/2019 upon evaluation today patient appears to be doing a little better in regard to the size of his wound though he still has some depth. This does seem to be much cleaner  has been using Santyl on the base of the wound followed by packing with silver alginate and behind. Nonetheless I do believe that based on what we are seeing we may need to switch to a collagen type dressing endoform may actually be excellent for him. 05/08/2019 upon evaluation today patient's wound actually appears to be showing better granulation tissue in the base of the wound. With that being said he does have some epiboly noted around the edges of the wound especially plantar and more distal. Subsequently this is can require sharp debridement today. Fortunately there is no signs of active systemic infection though I do feel like there is some local cellulitis noted at this point. The patient also notes has had increased drainage. 5/13; wound volume quite a bit improved this week down to 3 mm. Patient states pain and drainage better. Culture from last week grew staph aureus [not MRSA] and this is sensitive to quinolones and he is on  Levaquin. HOWEVER he also grew abundant Enterococcus faecalis treatment of choice for this is ampicillin. Quinolone coverage is unreliable 5/20 completing the Augmentin. Small wound on the right lateral foot. Thick rolled edges around the wound. Using silver alginate 05/29/2019 upon evaluation today patient appears to be doing better compared to last time I saw him. Fortunately there is no signs of active infection and I do feel like he is actually making good progress. Overall the quality of the wound bed along with the size looks improved and the inflammation/erythema that was previously noted by myself when I saw the couple weeks back actually has also resolved and this is great. Overall very pleased with how things stand. 06/06/2019 upon evaluation today patient actually appears to be doing quite well with regard to his foot ulcer with regard to some of the granulation were seen at this point. There does not appear to be any evidence of systemic or local infection although he still has discomfort I feel like things are moving in a slow but surely correct direction. I think we may need to help fill some of the space however even though he is using the collagen we may need to have something behind this such as a plain packing strip to try to help hold the collagen in the base of the wound. 06/12/2019 upon evaluation today patient appears to be doing well with regard to his foot ulcer. This seems to be making some slow but steady progress. The wound does not appear to be as deep today compared to where it was previous. Fortunately there is no signs of active infection at this time. No fevers, chills, nausea, vomiting, or diarrhea. 06/19/2019 on evaluation today patient's wound does not appear to be doing quite as well as what I would like to see. Fortunately there is no signs of systemic infection at this time which is great news. With that being said I do believe that the patient needs to have a wound  culture as I do SEARCY, MIYOSHI. ([id number]) believe he likely has a local infection that needs to be addressed. He is also can require some debridement today as well. 06/26/2019 upon evaluation today patient's wound actually appears to be showing signs of excellent improvement today. There does not appear to be any evidence of active infection which is great news. There does not appear to be significant erythema at this point. The patient does have improvement overall in the appearance of the infection he has been taking the Augmentin the culture came back showing  no organism was grown at this point but nonetheless I do believe he has been doing well with the antibiotics I would continue that at this point. 07/03/2019 upon evaluation today patient appears to be doing about the same in regard to his wound. There does not appear to be any evidence of active infection and overall I am pleased with where things stand. The patient is tolerating the dressing changes without complication but it does not appear that they are packing the wound at this time which I think is going to slow down his healing prospects. 07/10/2019 upon evaluation today patient appears to be doing little better in my opinion in regard to the overall moisture collected in the base of the wound. They have been packing this with a little bit of silver alginate and that has done quite well. Overall I am pleased in that regard. Subsequently the redness does not appear to be any worse today I did actually go ahead and marked this since this can be 2 weeks between now and when I see him again I would make sure that if anything changes or worsens he will be able to notice this and go ahead and initiate treatment with the Augmentin. 07/22/2019 patient comes in today a little bit early for evaluation secondary to the fact that he was having issues with increased drainage and concern about infection. With that being said he does appear to have  developed a blister area again proximal to where the original wound opening was in although the wound bed itself actually appears to be doing somewhat better he has had increased drainage and tracking of fluid here due to this blister. Nonetheless I believe that that does need to be cleaned away today but again I am not really certain that I see a lot of evidence for infection at this point. 07/29/2019 on evaluation today patient actually appears to be doing better in regard to his foot ulcer. There is improvement compared to last week which is great news. Nonetheless he does come prepared to apply the total contact cast today which I think would be beneficial for him. He knows he is not supposed to drive he did bring his son with him today in order to drive him. 07/31/2019 upon evaluation today patient appears to be doing well with regard to his wound today. I do not see any signs of infection I do believe that he is definitely showing some signs of improvement here in the office in a couple days as far as the overall erythema and pressure getting to the wound area is concerned. With that being said I do believe that the cast has been of great benefit for him. 08/08/2019 upon evaluation today patient appears to be doing better in regard to his wound. Overall I feel like he is showing signs of improvement. The external measurement is really not bad at all although I did remove some of the callus in order to open up the wound itself the depth is actually coming quite well and I am extremely pleased in that regard I do not think we can to see much improvement externally until we get the internal depth filled in and that again is improved. Fortunately there is no signs of active infection at this time. No fevers, chills, nausea, vomiting, or diarrhea. Electronic Signature(s) Signed: 08/08/2019 8:34:48 AM By: [name] PA-C Entered By: [name] on 08/08/2019 08:34:48 [name]  ([id number]) -------------------------------------------------------------------------------- Physical Exam Details Patient Name: Leron Croak,  Yardley J. Date of Service: 08/08/2019 8:00 AM Medical Record Number: [id number] Patient Account Number: [id number] Date of Birth/Sex: Mar 04, 1965 (54 y.o. M) Treating RN: Cornell Barman Primary Care Provider: [name] Other Clinician: Referring Provider: [name] Treating Provider/Extender: Melburn Hake, Eliany Mccarter Weeks in Treatment: 21 Constitutional Well-nourished and well-hydrated in no acute distress. Respiratory normal breathing without difficulty. Psychiatric this patient is able to make decisions and demonstrates good insight into disease process. Alert and Oriented x 3. pleasant and cooperative. Notes Upon inspection patient's wound bed actually showed signs of good granulation at this point does not appear to be any evidence of active infection overall very pleased with where we stand today. Electronic Signature(s) Signed: 08/08/2019 8:43:31 AM By: [name] PA-C Entered By: [name] on 08/08/2019 08:43:30 [name] ([id number]) -------------------------------------------------------------------------------- Physician Orders Details Patient Name: [name] Date of Service: 08/08/2019 8:00 AM Medical Record Number: [id number] Patient Account Number: [id number] Date of Birth/Sex: [birth year] (54 y.o. 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 - in office o Dial antibacterial soap, wash wounds, rinse and pat dry prior to dressing wounds Anesthetic (add to Medication List) Wound #4 Right,Lateral Foot o Topical Lidocaine 4% cream applied to wound bed prior to debridement (In Clinic Only). Primary Wound Dressing Wound #4 Right,Lateral Foot o Hydrafera Blue Ready Transfer - one piece inside wound 2nd piece laid on top Secondary Dressing Wound #4 Right,Lateral Foot o Foam - 3rd Dressing Change Frequency Wound #4 Right,Lateral Foot o Change dressing every week Follow-up Appointments Wound #4 Right,Lateral Foot o Return Appointment in 1 week. o Other: - Thursday for cast change Off-Loading Wound #4 Right,Lateral Foot o Total Contact Cast to Right Lower Extremity Electronic Signature(s) Signed: 08/08/2019 11:33:15 AM By: [name] Signed: 08/08/2019 5:17:08 PM By: [name] PA-C Entered By: [name] on 08/08/2019 08:30:20 [name] ([id number]) -------------------------------------------------------------------------------- Problem List Details Patient Name: RAMZEY, PETROVIC. Date of Service: 08/08/2019 8:00 AM Medical Record Number: [id number] Patient Account Number: [id number] Date of Birth/Sex: Dec 06, 1965 (54 y.o. M) Treating RN: Cornell Barman Primary Care Provider: [name] Other Clinician: Referring Provider: [name] Treating Provider/Extender: Melburn Hake, Eryc Bodey Weeks in Treatment: 21 Active Problems ICD-10 Encounter Code Description Active Date MDM Diagnosis E11.621 Type 2 diabetes mellitus with foot ulcer 03/13/2019 No Yes L97.512 Non-pressure chronic ulcer of other part of right foot with fat layer 03/13/2019 No Yes exposed L03.115 Cellulitis of right lower limb 05/15/2019 No Yes L84 Corns and callosities 03/13/2019 No Yes I10 Essential (primary)  hypertension 03/13/2019 No Yes Z89.422 Acquired absence of other left toe(s) 03/13/2019 No Yes Inactive Problems Resolved Problems Electronic Signature(s) Signed: 08/08/2019 8:24:57 AM By: [name] PA-C Entered By: [name] on 08/08/2019 08:24:57 [name] ([id number]) -------------------------------------------------------------------------------- Progress Note Details Patient Name: [name] Date of Service: 08/08/2019 8:00 AM Medical Record Number: [id number] Patient Account Number: [id number] Date of Birth/Sex: [birth year] (54 y.o. M) Treating RN: [name] Primary Care Provider: [name] Other Clinician:  Referring Provider: [name] Treating Provider/Extender: Melburn Hake, Oniyah Rohe Weeks in Treatment: 21 Subjective Chief Complaint Information obtained from Patient Right lateral foot ulcer History of Present Illness (HPI) This 54 year old male comes with an ulcerated area on the plantar aspect of the right foot which she's had for approximately a month. I have known him from a previous visit at [facility] wound center and was treated in the months of April and May 2016 and rapidly healed a left plantar ulcer with a total contact cast. He has been a diabetic for about 16 years and tries to keep active and is fairly compliant with his diabetes management. 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 Past medical history significant for hypertension, hyperlipidemia, and status post appendectomy 1993. He does not smoke or drink alcohol. 01/14/2015 -- the patient had tolerated his total contact cast very well and had no problems and has had no systemic symptoms. However when his total contact cast was cut open he had excessive amount of purulent drainage in spite of being on antibiotics. He had had a recent x-ray done in the ER 12/22/2014 which showed IMPRESSION:No evidence of osseous erosion. Known soft tissue ulceration is not well  characterized on radiograph. Scattered vascular calcifications seen. his last hemoglobin A1c in December was 7.3. He has been on Augmentin and doxycycline for the last 2 weeks. 01/21/2015 -- his culture grew rare growth of Pantoea species an MR moderate growth of Candida parapsilosis. it is sensitive to levofloxacin. He has not heard back from the insurance company regarding his hyperbaric oxygen therapy. His MRI has not been done yet and we will try and get him an earlier date 01/28/2015 -- MRI was done last night -- IMPRESSION:1. Soft tissue ulcer overlying the plantar aspect of the fifth metatarsal head extending to the cortex. Subcortical marrow edema in the fifth metatarsal head with corresponding T1 hypointensity is concerning for early osteomyelitis of the plantar lateral aspect of the fifth metatarsal head. Chest x-ray done on 01/14/2015 shows bronchiectatic changes without infiltrate. EKG done on generally 17 2017 shows a normal sinus rhythm and is a normal EKG. 02/04/2015 -- he was asked to see Dr. [name] last week and had 2 appointments but had to cancel both due to pressures of work. Last night he has woken up with severe pain in the foot and leg and it is swollen up. No fever or no change in his blood glucose. Addendum: I spoke with Dr. [name] who kindly agreed to accept the patient for inpatient therapy and have also opened to the hospitalist Dr. [name], and discuss details of the management including PICC line and repeat cultures. 02/12/2015-- -- was seen by Dr. [name] in the hospital and a PICC line was placed. He was to receive Ceftazidime 2 g every 12 hourly, oral levofloxacin 750 mg every 24 hourly and oral fluconazole 200 mg daily. The antibiotics were to be given for 4 weeks except the Diflucan was to be given for the first 2 weeks. Reviewed note from 02/10/2015 -- and Dr. [name] had recommended management for growth of MSSA and Serratia. He switched him  from ceftazidime to ceftriaxone 2 g every 24 hours. Levofloxacin was stopped and he would continue on fluconazole for another week. He had asked me to decide whether further imaging was necessary and whether surgical debridement of the infected bone was needed. He is doing well and has been off work for this week and we will keep him off the next week. 02/22/2015 -- he  was seen by my colleague on 01/19/2015 and at that time an incision and drainage was done on his right lateral forefoot on the dorsum. Today when I probed this wound it is frankly draining pus and it communicates with the ulcer on the plantar aspect of his right foot. The patient is still on IV ceftriaxone 2 g every 24 hours and is to be seen by Dr. [name] on Friday. 03/19/2015 -- On 03/04/2015 I spoke to Dr. 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 He recommended operative debridement which would probably result in the fifth metatarsal head and toe amputation.The patient would be referred back to [place] once he was done with surgery. He was admitted to [facility] yesterday and had surgery done by podiatry for a right fifth metatarsal acute osteomyelitis with cellulitis and abscess. He had a right foot incision and drainage with fifth metatarsal partial amputation and removal of toe infected bone and soft tissue with cultures. The wound was partially closed and packing of the distal end was done. Patient was already on cefepime 2 g IV every 8 hourly and put on vancomycin pending final cultures. He had grown moderate gram-negative rods, and later found to be rare diphtheroids. I received a call from Dr. Cannon Kettle the podiatrist and we discussed the above. On 03/10/2015 he was found positive for influenza a and has been put on Tamiflu. Since his discharge he has been  seen by Dr. Cannon Kettle who is planning to remove his sutures this coming week. He was reviewed by Dr. [name] on 03/17/2015 and his Diflucan was stopped and vancomycin. After the 3 doses he is taking. He is going to change Ceftazidime to Zosyn 3.375 g IV every 8 hours. 03/25/2015 -- he was seen by the podiatrist a couple of days ago and the sutures were removed. She will follow back with him in 4 weeks' time and at that time x-rays will be taken and a custom molded insert would be made for his shoe. 04/01/2015 -- he was seen by Dr. [name] on 03/29/2015 who pulled the PICC line stopped his IV antibiotics and recommended starting doxycycline and levofloxacin for 2 weeks. He also stop the fluconazole. The patient will follow up with him only when necessary. YOUSOF, ALDERMAN ([id number]) Readmission: 03/15/17 on evaluation today patient presents for initial evaluation concerning the new issues although he has previously been evaluated in our clinic. Unfortunately the previous evaluation led to the patient having to proceed to amputation and so he is somewhat nervous about being here today. With that being said he has a very slight blister that occurred on the plantar aspect more medial on the right great toe that has been present for just a very short amount of time, several days. With that being said he felt like initially when he called that he somewhat overreacted but due to the fact that his previous issue led to amputation he is very cautious these days I explained that he did the right thing. With that being said he has been tolerating the dressing changes without complication mainly he just been covering this. He does not have any discomfort in secondary to neuropathy it's unlikely that he feels much. With that being said he does tell me that the issue that he had here is that he went barefoot  when he knows he should not have which subsequently led to the blisters. He states is definitely  not doing that anymore. His most recent hemoglobin A1c was December and registered 6.9 his ABI today was 1.1. 03/27/17 on evaluation today patient appears to be doing great in regard to his right great toe ulcer. He has been tolerating the dressing changes without complication. The good news is this is making excellent progress and he seems to be caring for this in an excellent fashion. I see no evidence of breakdown that would make me concerned that he was at risk for infection/amputation. This has obviously been his concern due to the fifth toe amputation that was necessitated previous when he had a similar issue. Nonetheless this seems to be progressing much more nicely. Readmission: 03/13/2019 upon evaluation today patient presents for reevaluation here in the clinic concerning issues he has been having with his right foot on the lateral portion at the proximal end of the metatarsal. Subsequently he tells me that this just opened up in the past couple of days or so and the erythema really began about 24 to 48 hours ago. Fortunately there is no signs of systemic infection. 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 That left him with this bony prominence that is where the wound is currently. There is erythema surrounding that does have me concerned about cellulitis. With that being said he was noncompressible as far as ABIs are concerned I do think we can need to check into arterial studies at this point. Patient does have a history of hypertension along with diabetes mellitus type 2. He also tends to develop quite a bit of callus white often. 03/20/2019 upon evaluation today patient appears to be doing very well in regard to his wounds currently. He has been tolerating the dressing changes without complication. Fortunately there is no signs of infection. His ABIs were good at this point and registered at 1.07 on the left and 1.09 on the right. In  regard to the x-ray this was negative as well for any signs of osteomyelitis. The patient also seems to be doing better in regard to the infection. He still has several days of the antibiotic left at this point best the Bactrim DS and he seems to be doing very well with this. Overall I am extremely pleased with the progress in 1 week's time he is going require some sharp debridement today however. 03/27/2019 upon evaluation today patient actually appears to be making some progress in regard to his wound. It is a little deeper but measuring smaller which is good news due to the fact that again were clear away some of the necrotic tissue which is why the depth is increasing a little bit. Nonetheless after like were getting very close to being down at a good wound bed. Fortunately there is no signs of active infection at this time. There is a little bit of erythema immediately surrounding the wound that we do want to be very cognizant of and careful about. For that reason I am going to go ahead and extend his antibiotic today for an additional 10 days that is the Bactrim. 04/03/2019 upon evaluation today patient appears to be making some progress. I do feel like the Annitta Needs is doing a good job along with the alginate is being packed into the wound space behind. He has been tolerating the dressing changes without complication. Fortunately there is no signs of active  infection at this time. No fevers, chills, nausea, vomiting, or diarrhea. 04/10/2019 upon evaluation today patient appears to be making progress. The wound is not quite as deep but he still does not have a great wound surface yet for working on this and the Santyl does seem to be cleaning things up. Fortunately there is no evidence of active infection at this time. No fevers, chills, nausea, vomiting, or diarrhea. 04/17/2019 upon evaluation today patient appears to be doing excellent at this time in regard to his foot ulcer. I do believe the Santyl  with the alginate packed in behind has been of benefit for him and I am very pleased in this regard. With that being said I am feeling like the wound surface is improving quite a bit each time I see him still I do not believe we are at the point of stating that the wound bed is perfect but again were making progress. The patient is going require some sharp debridement today. 4/22; this is a patient who is using Santyl with calcium alginate. Apparently his wound dimensions have been improving. Patient is changing the dressing himself. He has a surgical shoe. He works in a sitting position therefore is not on his feet all that much. There is undermining 05/01/2019 upon evaluation today patient appears to be doing a little better in regard to the size of his wound though he still has some depth. This does seem to be much cleaner has been using Santyl on the base of the wound followed by packing with silver alginate and behind. Nonetheless I do believe that based on what we are seeing we may need to switch to a collagen type dressing endoform may actually be excellent for him. 05/08/2019 upon evaluation today patient's wound actually appears to be showing better granulation tissue in the base of the wound. With that being said he does have some epiboly noted around the edges of the wound especially plantar and more distal. Subsequently this is can require sharp debridement today. Fortunately there is no signs of active systemic infection though I do feel like there is some local cellulitis noted at this point. The patient also notes has had increased drainage. 5/13; wound volume quite a bit improved this week down to 3 mm. Patient states pain and drainage better. Culture from last week grew staph aureus [not MRSA] and this is sensitive to quinolones and he is on Levaquin. HOWEVER he also grew abundant Enterococcus faecalis treatment of choice for this is ampicillin. Quinolone coverage is unreliable 5/20  completing the Augmentin. Small wound on the right lateral foot. Thick rolled edges around the wound. Using silver alginate 05/29/2019 upon evaluation today patient appears to be doing better compared to last time I saw him. Fortunately there is no signs of active infection and I do feel like he is actually making good progress. Overall the quality of the wound bed along with the size looks improved and the inflammation/erythema that was previously noted by myself when I saw the couple weeks back actually has also resolved and this is great. Overall very pleased with how things stand. 06/06/2019 upon evaluation today patient actually appears to be doing quite well with regard to his foot ulcer with regard to some of the granulation were seen at this point. There does not appear to be any evidence of systemic or local infection although he still has discomfort I feel like things are moving in a slow but surely correct direction. I think we may need  to help fill some of the space however even though he is using the collagen we may need to have something behind this such as a plain packing strip to try to help hold the collagen in the base of the wound. 06/12/2019 upon evaluation today patient appears to be doing well with regard to his foot ulcer. This seems to be making some slow but steady progress. The wound does not appear to be as deep today compared to where it was previous. Fortunately there is no signs of active infection at DIRON, HADDON. ([id number]) this time. No fevers, chills, nausea, vomiting, or diarrhea. 06/19/2019 on evaluation today patient's wound does not appear to be doing quite as well as what I would like to see. Fortunately there is no signs of systemic infection at this time which is great news. With that being said I do believe that the patient needs to have a wound culture as I do believe he likely has a local infection that needs to be addressed. He is also can require some  debridement today as well. 06/26/2019 upon evaluation today patient's wound actually appears to be showing signs of excellent improvement today. There does not appear to be any evidence of active infection which is great news. There does not appear to be significant erythema at this point. The patient does have improvement overall in the appearance of the infection he has been taking the Augmentin the culture came back showing no organism was grown at this point but nonetheless I do believe he has been doing well with the antibiotics I would continue that at this point. 07/03/2019 upon evaluation today patient appears to be doing about the same in regard to his wound. There does not appear to be any evidence of active infection and overall I am pleased with where things stand. The patient is tolerating the dressing changes without complication but it does not appear that they are packing the wound at this time which I think is going to slow down his healing prospects. 07/10/2019 upon evaluation today patient appears to be doing little better in my opinion in regard to the overall moisture collected in the base of the wound. They have been packing this with a little bit of silver alginate and that has done quite well. Overall I am pleased in that regard. Subsequently the redness does not appear to be any worse today I did actually go ahead and marked this since this can be 2 weeks between now and when I see him again I would make sure that if anything changes or worsens he will be able to notice this and go ahead and initiate treatment with the Augmentin. 07/22/2019 patient comes in today a little bit early for evaluation secondary to the fact that he was having issues with increased drainage and concern about infection. With that being said he does appear to have developed a blister area again proximal to where the original wound opening was in although the wound bed itself actually appears to be doing  somewhat better he has had increased drainage and tracking of fluid here due to this blister. Nonetheless I believe that that does need to be cleaned away today but again I am not really certain that I see a lot of evidence for infection at this point. 07/29/2019 on evaluation today patient actually appears to be doing better in regard to his foot ulcer. There is improvement compared to last week which is great news. Nonetheless he does come prepared  to apply the total contact cast today which I think would be beneficial for him. He knows he is not supposed to drive he did bring his son with him today in order to drive him. 07/31/2019 upon evaluation today patient appears to be doing well with regard to his wound today. I do not see any signs of infection I do believe that he is definitely showing some signs of improvement here in the office in a couple days as far as the overall erythema and pressure getting to the wound area is concerned. With that being said I do believe that the cast has been of great benefit for him. 08/08/2019 upon evaluation today patient appears to be doing better in regard to his wound. Overall I feel like he is showing signs of improvement. The external measurement is really not bad at all although I did remove some of the callus in order to open up the wound itself the depth is actually coming quite well and I am extremely pleased in that regard I do not think we can to see much improvement externally until we get the internal depth filled in and that again is improved. Fortunately there is no signs of active infection at this time. No fevers, chills, nausea, vomiting, or diarrhea. Objective Constitutional Well-nourished and well-hydrated in no acute distress. Vitals Time Taken: 8:05 AM, Height: 74 in, Weight: 308 lbs, BMI: 39.5, Temperature: 98.5 F, Pulse: 76 bpm, Respiratory Rate: 18 breaths/min, Blood Pressure: 159/67 mmHg. Respiratory normal breathing without  difficulty. Psychiatric this patient is able to make decisions and demonstrates good insight into disease process. Alert and Oriented x 3. pleasant and cooperative. General Notes: Upon inspection patient's wound bed actually showed signs of good granulation at this point does not appear to be any evidence of active infection overall very pleased with where we stand today. Integumentary (Hair, Skin) Wound #4 status is Open. Original cause of wound was Gradually Appeared. The wound is located on the Right,Lateral Foot. The wound measures 0.6cm length x 0.9cm width x 0.5cm depth; 0.424cm^2 area and 0.212cm^3 volume. There is Fat Layer (Subcutaneous Tissue) Exposed exposed. There is no tunneling or undermining noted. There is a medium amount of serous drainage noted. The wound margin is flat and intact. There is medium (34-66%) pink granulation within the wound bed. There is a medium (34-66%) amount of necrotic tissue within the wound bed including Adherent Slough. KIANTE, CIAVARELLA ([id number]) Assessment Active Problems ICD-10 Type 2 diabetes mellitus with foot ulcer 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d Cellulitis of right lower limb Corns and callosities Essential (primary) hypertension Acquired absence of other left toe(s) Procedures Wound #4 Pre-procedure diagnosis of Wound #4 is a Diabetic Wound/Ulcer of the Lower Extremity located on the Right,Lateral Foot .Severity of Tissue Pre Debridement is: Fat layer exposed. There was a Excisional Skin/Subcutaneous Tissue Debridement with a total area of 0.3 sq cm performed by STONE III, Marietta Sikkema E., PA-C. With the following instrument(s): Curette to remove Viable and Non-Viable tissue/material. Material removed includes Callus, Subcutaneous Tissue, and Slough after achieving pain control using Lidocaine. A time out was conducted at 08:20, prior to the start of the procedure. A Minimum amount of bleeding was  controlled with Pressure. The procedure was tolerated well. Post Debridement Measurements: 0.6cm length x 0.5cm width x 0.5cm depth; 0.118cm^3 volume. Character of Wound/Ulcer Post Debridement is stable. Severity of Tissue Post Debridement is: Fat layer exposed. Post procedure Diagnosis Wound #4: Same  as Pre-Procedure Pre-procedure diagnosis of Wound #4 is a Diabetic Wound/Ulcer of the Lower Extremity located on the Right,Lateral Foot . There was a Total Contact Cast Procedure by STONE III, Tara Rud E., PA-C. Post procedure Diagnosis Wound #4: Same as Pre-Procedure Plan Wound Cleansing: Wound #4 Right,Lateral Foot: Clean wound with Normal Saline. - in office Dial antibacterial soap, wash wounds, rinse and pat dry prior to dressing wounds Anesthetic (add to Medication List): Wound #4 Right,Lateral Foot: Topical Lidocaine 4% cream applied to wound bed prior to debridement (In Clinic Only). Primary Wound Dressing: Wound #4 Right,Lateral Foot: Hydrafera Blue Ready Transfer - one piece inside wound 2nd piece laid on top Secondary Dressing: Wound #4 Right,Lateral Foot: Foam - 3rd Dressing Change Frequency: Wound #4 Right,Lateral Foot: Change dressing every week Follow-up Appointments: Wound #4 Right,Lateral Foot: Return Appointment in 1 week. Other: - Thursday for cast change Off-Loading: Wound #4 Right,Lateral Foot: Total Contact Cast to Right Lower Extremity 1. I am going to recommend currently that we go ahead and continue with the total contact cast I do believe that is beneficial for the patient. 2. I am also going to recommend that we go ahead and have the patient switch to a Hydrofera Blue dressing will use that inside. The clinic and subsequently am hopeful this will actually showed signs of good improvement with this as far as the overall appearance of the surface of the wound is concerned. 3. I am also can recommend that the patient continue with his normal activity as he has been  I think that he is doing well in that regard I see no issues today. EVERTT, CHOUINARD ([id number]) We will see patient back for reevaluation in 1 week here in the clinic. If anything worsens or changes patient will contact our office for additional recommendations. Electronic Signature(s) Signed: 08/08/2019 9:42:50 AM By: [name] PA-C Entered By: [name] on 08/08/2019 09:42:50 [name] ([id number]) -------------------------------------------------------------------------------- Total Contact Cast Details Patient Name: COLBI, STAUBS. Date of Service: 08/08/2019 8:00 AM Medical Record Number: [id number] Patient Account Number: [id number] Date of Birth/Sex: 22-Oct-1965 (54 y.o. M) Treating RN: [name] Primary Care Provider: [name] Other Clinician: Referring Provider: [name] Treating Provider/Extender: Melburn Hake, Toshie Demelo Weeks in Treatment: 21 Total Contact Cast Applied for Wound Assessment: Wound #4 Right,Lateral Foot Performed By: Physician Emilio Math., PA-C Post Procedure Diagnosis Same as Pre-procedure Electronic Signature(s) Signed: 08/08/2019 11:33:15 AM By: [name] Signed: 08/08/2019 5:17:08 PM By: [name] PA-C Entered By: [name] on 08/08/2019 08:31:07 [name] ([id number]) -------------------------------------------------------------------------------- SuperBill Details Patient Name: [name] Date of Service: 08/08/2019 Medical Record Number: [id number] Patient Account Number: [id number] Date of Birth/Sex: [birth year] (54 y.o. 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) Facility Procedures CPT4 Code: 76195093 Description: 11042 - DEB SUBQ TISSUE 20 SQ CM/< Modifier: Quantity: 1 CPT4 Code: Description: ICD-10 Diagnosis Description L97.512 Non-pressure chronic ulcer of other part of right foot with fat layer ex Modifier: posed Quantity: Physician Procedures CPT4 Code: 2671245 Description: 11042 - WC PHYS SUBQ TISS 20 SQ CM Modifier: Quantity:  1 CPT4 Code: Description: ICD-10 Diagnosis Description 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

## 2019-08-14 NOTE — Progress Notes (Signed)
[name], [name] ([id number]) Visit Report for 08/08/2019 Arrival Information Details Patient Name: [name], [name]. Date of Service: 08/08/2019 8:00 AM Medical Record Number: [id number] Patient Account Number: [id number] Date of Birth/Sex: 1965-04-03 (54 y.o. M) Treating [name]: [name] Primary Care [name]: [name] Other Clinician: Referring [name]: [name] Treating [name]/Extender: [name], [name] [name]: 21 Visit Information History Since Last Visit All ordered tests and consults were completed: No Patient Arrived: Ambulatory Added or deleted any medications: No Arrival Time: 08:09 Any new allergies or adverse reactions: No Accompanied By: self Had a fall or experienced change in No Transfer Assistance: None activities of daily living that may affect Patient Requires Transmission-Based Precautions: No risk of falls: Patient Has Alerts: Yes Signs or symptoms of abuse/neglect since last visito No Patient Alerts: DMII Hospitalized since last visit: No Implantable device outside of the clinic excluding No cellular tissue based products placed in the center since last visit: Has Dressing in Place as Prescribed: Yes Has Footwear/Offloading in Place as Prescribed: Yes Right: Wedge Shoe Other:cast Pain Present Now: No Electronic Signature(s) Signed: 08/14/2019 10:15:32 AM By: [name] Entered By: [name] on 08/08/2019 08:10:41 [name] ([id number]) -------------------------------------------------------------------------------- Encounter Discharge Information Details Patient Name: [name] Date of Service: 08/08/2019 8:00 AM Medical Record Number: [id number] Patient Account Number: [id number] Date of Birth/Sex: 1965/11/11 (54 y.o. M) Treating [name]: [name] Primary Care Joylene Wescott: [name] Other Clinician: Referring [name]: [name] Treating Melaya Hoselton/Extender: [name],  [name] [name]: 21 Encounter Discharge Information Items Post Procedure Vitals Discharge Condition: Stable Temperature (F): 98.5 Ambulatory Status: Ambulatory Pulse (bpm): 76 Discharge Destination: Home Respiratory Rate (breaths/min): 16 Transportation: Private Auto Blood Pressure (mmHg): 159/67 Accompanied By: self Schedule Follow-up Appointment: Yes Clinical Summary of Care: Electronic Signature(s) Signed: 08/08/2019 11:33:15 AM By: [name] Entered By: [name] on 08/08/2019 08:32:07 [name] ([id number]) -------------------------------------------------------------------------------- Lower Extremity Assessment Details Patient Name: [name] Date of Service: 08/08/2019 8:00 AM Medical Record Number: [id number] Patient Account Number: [id number] Date of Birth/Sex: 12-Apr-1965 (54 y.o. M) Treating [name]: [name] Primary Care Barri Neidlinger: [name] Other Clinician: Referring [name]: [name] Treating [name]/Extender: [name], [name] [name]: 21 Edema Assessment Assessed: [Left: No] [Right: No] Edema: [Left: N] [Right: o] Vascular Assessment Pulses: Dorsalis Pedis Palpable: [Right:Yes] Electronic Signature(s) Signed: 08/12/2019 4:54:20 PM By: [name] Signed: 08/14/2019 10:15:32 AM By: [name] Entered By: [name] on 08/08/2019 08:22:16 [name], [name] ([id number]) -------------------------------------------------------------------------------- Multi Wound Chart Details Patient Name: [name], [name]. Date of Service: 08/08/2019 8:00 AM Medical Record Number: [id number] Patient Account Number: [id number] Date of Birth/Sex: [birth year] (54 y.o. M) Treating [name]: [name] Primary Care Azha Constantin: [name] Other Clinician: Referring Taran Haynesworth: [name] Treating [name]/Extender: [name], [name] [name]: 21 Vital Signs Height(in):  74 Pulse(bpm): 76 Weight(lbs): 308 Blood Pressure(mmHg): 159/67 Body Mass Index(BMI): 40 Temperature(F): 98.5 Respiratory Rate(breaths/min): 18 Photos: [N/A:N/A] Wound Location: Right, Lateral Foot N/A N/A Wounding Event: Gradually Appeared N/A N/A Primary Etiology: Diabetic Wound/Ulcer of the Lower N/A N/A Extremity Comorbid History: Hypertension, Type II Diabetes, N/A N/A Neuropathy Date Acquired: 03/12/2019 N/A N/A [name] of [name]: 21 N/A N/A Wound Status: Open N/A N/A Pending Amputation on Yes N/A N/A Presentation: Measurements L x W x D (cm) 0.6x0.9x0.5 N/A N/A Area (cm) : 0.424 N/A N/A Volume (cm) : 0.212 N/A N/A % Reduction in Area: 33.30% N/A N/A % Reduction in Volume: -66.90% N/A N/A  Classification: Grade 1 N/A N/A Exudate Amount: Medium N/A N/A Exudate Type: Serous N/A N/A Exudate Color: amber N/A N/A Wound Margin: Flat and Intact N/A N/A Granulation Amount: Medium (34-66%) N/A N/A Granulation Quality: Pink N/A N/A Necrotic Amount: Medium (34-66%) N/A N/A Exposed Structures: Fat Layer (Subcutaneous Tissue) N/A N/A Exposed: Yes Fascia: No Tendon: No Muscle: No Joint: No Bone: No Epithelialization: Small (1-33%) N/A N/A [name] Notes Electronic Signature(s) Signed: 08/08/2019 11:33:15 AM By: [name] Entered By: [name] on 08/08/2019 08:26:18 [name] ([name] ([id number]) -------------------------------------------------------------------------------- [place] Details Patient Name: [name], [name]. Date of Service: 08/08/2019 8:00 AM Medical Record Number: [id number] Patient Account Number: [id number] Date of Birth/Sex: [birth year] (54 y.o. M) Treating [name]: [name] Primary Care Mayo Owczarzak: [name] Other Clinician: Referring Jenniger Figiel: [name] Treating Camarion Weier/Extender: [name], [name] [name]: 21 Active Inactive Nutrition Nursing  Diagnoses: Impaired glucose control: actual or potential Goals: Patient/caregiver verbalizes understanding of need to maintain therapeutic glucose control per primary care physician Date Initiated: 03/13/2019 Target Resolution Date: 04/11/2019 Goal Status: Active Interventions: Assess patient nutrition upon admission and as needed per policy Notes: Orientation to the Wound Care Program Nursing Diagnoses: Knowledge deficit related to the wound healing center program Goals: Patient/caregiver will verbalize understanding of the [place] Date Initiated: 03/13/2019 Target Resolution Date: 04/11/2019 Goal Status: Active Interventions: Provide education on orientation to the wound center Notes: Wound/Skin Impairment Nursing Diagnoses: Impaired tissue integrity Goals: Ulcer/skin breakdown will have a volume reduction of 30% by week 4 Date Initiated: 03/13/2019 Target Resolution Date: 04/11/2019 Goal Status: Active Interventions: Assess ulceration(s) every visit Notes: Electronic Signature(s) Signed: 08/08/2019 11:33:15 AM By: [name] Entered By: [name] on 08/08/2019 08:26:09 [name] ([id number]) -------------------------------------------------------------------------------- Pain Assessment Details Patient Name: [name] Date of Service: 08/08/2019 8:00 AM Medical Record Number: [id number] Patient Account Number: [id number] Date of Birth/Sex: 08-Jan-1965 (54 y.o. M) Treating [name]: [name] Primary Care Rance Smithson: [name] Other Clinician: Referring Valentine Kuechle: [name] Treating Mahlet Jergens/Extender: [name], [name] [name]: 21 Active Problems Location of Pain Severity and Description of Pain Patient Has Paino No Site Locations With Dressing Change: No Pain Management and Medication Current Pain Management: Electronic Signature(s) Signed: 08/12/2019 4:54:20 PM By: [name] Signed:  08/14/2019 10:15:32 AM By: [name] Entered By: [name] on 08/08/2019 08:11:28 [name] ([id number]) -------------------------------------------------------------------------------- Patient/Caregiver Education Details Patient Name: [name], MARTENSEN. Date of Service: 08/08/2019 8:00 AM Medical Record Number: [id number] Patient Account Number: [id number] Date of Birth/Gender: 08-24-1965 (54 y.o. M) Treating [name]: [name] Primary Care Physician: [name] Other Clinician: Referring Physician: [name] Treating Physician/Extender: Sharalyn Ink in [name]: 21 Education Assessment Education Provided To: Patient Education Topics Provided Wound/Skin Impairment: Handouts: Caring for Your Ulcer Methods: Demonstration, Explain/Verbal Responses: State content correctly Electronic Signature(s) Signed: 08/08/2019 11:33:15 AM By: [name] Entered By: [name] on 08/08/2019 08:31:19 [name] ([id number]) -------------------------------------------------------------------------------- Wound Assessment Details Patient Name: [name] Date of Service: 08/08/2019 8:00 AM Medical Record Number: [id number] Patient Account Number: [id number] Date of Birth/Sex: Aug 13, 1965 (54 y.o. M) Treating [name]: [name] Primary Care Quitman Norberto: [name] Other Clinician: Referring Nadra Hritz: [name] Treating Idus Rathke/Extender: [name], [name] [name]: 21 Wound Status Wound Number: 4 Primary Etiology: Diabetic Wound/Ulcer of the Lower Extremity Wound Location: Right, Lateral Foot Wound Status: Open Wounding Event: Gradually Appeared Comorbid History: Hypertension, Type II Diabetes, Neuropathy  Date Acquired: 03/12/2019 [name] Of [name]: 21 Clustered Wound: No Pending Amputation On Presentation Photos Wound Measurements Length: (cm) 0.6 % Redu Width: (cm) 0.9 % Redu Depth: (cm) 0.5 Epithe Area:  (cm) 0.424 [place] Volume: (cm) 0.212 Under ction in Area: 33.3% ction in Volume: -66.9% lialization: Small (1-33%) ling: No mining: No Wound Description Classification: Grade 1 Foul O Wound Margin: Flat and Intact Slough Exudate Amount: Medium Exudate Type: Serous Exudate Color: amber dor After Cleansing: No /Fibrino Yes Wound Bed Granulation Amount: Medium (34-66%) Exposed Structure Granulation Quality: Pink Fascia Exposed: No Necrotic Amount: Medium (34-66%) Fat Layer (Subcutaneous Tissue) Exposed: Yes Necrotic Quality: Adherent Slough Tendon Exposed: No Muscle Exposed: No Joint Exposed: No Bone Exposed: No [name] Notes Wound #4 (Right, Lateral Foot) Notes hydrofera blue, foam ,TCC right [name], [name] ([id number]) Electronic Signature(s) Signed: 08/12/2019 4:54:20 PM By: [name] Signed: 08/14/2019 10:15:32 AM By: [name] Entered By: [name] on 08/08/2019 08:21:58 [name] ([id number]) -------------------------------------------------------------------------------- Vitals Details Patient Name: [name] Date of Service: 08/08/2019 8:00 AM Medical Record Number: [id number] Patient Account Number: [id number] Date of Birth/Sex: 11/16/1965 (54 y.o. M) Treating [name]: [name] Primary Care Cassadi Purdie: [name] Other Clinician: Referring Monnie Anspach: [name] Treating Jashae Wiggs/Extender: [name], [name] [name]: 21 Vital Signs Time Taken: 08:05 Temperature (F): 98.5 Height (in): 74 Pulse (bpm): 76 Weight (lbs): 308 Respiratory Rate (breaths/min): 18 Body Mass Index (BMI): 39.5 Blood Pressure (mmHg): 159/67 Reference Range: 80 - 120 mg / dl Electronic Signature(s) Signed: 08/14/2019 10:15:32 AM By: [name] Entered By: [name] on 08/08/2019 08:11:12

## 2019-08-15 ENCOUNTER — Other Ambulatory Visit: Payer: Self-pay

## 2019-08-15 ENCOUNTER — Encounter: Payer: Managed Care, Other (non HMO) | Admitting: Physician Assistant

## 2019-08-15 DIAGNOSIS — E11621 Type 2 diabetes mellitus with foot ulcer: Secondary | ICD-10-CM | POA: Diagnosis not present

## 2019-08-15 NOTE — Progress Notes (Signed)
[name], [name] ([id number]) Visit Report for 08/15/2019 Arrival Information Details Patient Name: [name], LAMP. Date of Service: 08/15/2019 8:15 AM Medical Record Number: [id number] Patient Account Number: [id number] Date of Birth/Sex: 17-Apr-1965 (54 y.o. M) Treating RN: Grover Canavan Primary Care Kalley Nicholl: [name] Other Clinician: Referring Breyon Sigg: [name] Treating Argyle Gustafson/Extender: Melburn Hake, HOYT Weeks in Treatment: 22 Visit Information History Since Last Visit Any new allergies or adverse reactions: No Patient Arrived: Ambulatory Had a fall or experienced change in No Arrival Time: 08:15 activities of daily living that may affect Accompanied By: self risk of falls: Transfer Assistance: None Hospitalized since last visit: No Patient Requires Transmission-Based Precautions: No Pain Present Now: No Patient Has Alerts: Yes Patient Alerts: DMII Electronic Signature(s) Signed: 08/15/2019 4:49:15 PM By: Grover Canavan Entered By: Grover Canavan on 08/15/2019 08:16:14 [name] ([id number]) -------------------------------------------------------------------------------- Encounter Discharge Information Details Patient Name: [name] Date of Service: 08/15/2019 8:15 AM Medical Record Number: [id number] Patient Account Number: [id number] Date of Birth/Sex: 11/20/1965 (54 y.o. M) Treating RN: Cornell Barman Primary Care Klyde Banka: [name] Other Clinician: Referring Mahati Vajda: [name] Treating Temia Debroux/Extender: Melburn Hake, HOYT Weeks in Treatment: 22 Encounter Discharge Information Items Post Procedure Vitals Discharge Condition: Stable Temperature (F): 98.3 Ambulatory Status: Ambulatory Pulse (bpm): 76 Discharge Destination: Home Respiratory Rate (breaths/min): 16 Transportation: Private Auto Blood Pressure (mmHg): 150/63 Accompanied By: self Schedule Follow-up Appointment: Yes Clinical Summary of  Care: Electronic Signature(s) Signed: 08/15/2019 6:14:49 PM By: Gretta Cool, BSN, RN, CWS, Kim RN, BSN Entered By: Gretta Cool, BSN, RN, CWS, Kim on 08/15/2019 08:50:16 [name] ([id number]) -------------------------------------------------------------------------------- Lower Extremity Assessment Details Patient Name: [name], MARKMAN. Date of Service: 08/15/2019 8:15 AM Medical Record Number: [id number] Patient Account Number: [id number] Date of Birth/Sex: 06-Mar-1965 (54 y.o. M) Treating RN: Grover Canavan Primary Care Darleene Cumpian: [name] Other Clinician: Referring Dalan Cowger: [name] Treating Keiko Myricks/Extender: Melburn Hake, HOYT Weeks in Treatment: 22 Edema Assessment Assessed: [Left: No] [Right: Yes] Edema: [Left: N] [Right: o] Electronic Signature(s) Signed: 08/15/2019 4:49:15 PM By: Grover Canavan Entered By: Grover Canavan on 08/15/2019 08:38:11 [name] ([id number]) -------------------------------------------------------------------------------- Multi Wound Chart Details Patient Name: [name] Date of Service: 08/15/2019 8:15 AM Medical Record Number: [id number] Patient Account Number: [id number] Date of Birth/Sex: 1965-11-01 (54 y.o. M) Treating RN: Cornell Barman Primary Care Marylyn Appenzeller: [name] Other Clinician: Referring Lanita Stammen: [name] Treating Gabriele Loveland/Extender: Melburn Hake, HOYT Weeks in Treatment: 22 Vital Signs Height(in): 74 Pulse(bpm): 60 Weight(lbs): 308 Blood Pressure(mmHg): 150/63 Body Mass Index(BMI): 40 Temperature(F): 98.3 Respiratory Rate(breaths/min): 16 Photos: [N/A:N/A] Wound Location: Right, Lateral Foot N/A N/A Wounding Event: Gradually Appeared N/A N/A Primary Etiology: Diabetic Wound/Ulcer of the Lower N/A N/A Extremity Comorbid History: Hypertension, Type II Diabetes, N/A N/A Neuropathy Date Acquired: 03/12/2019 N/A N/A Weeks of Treatment: 22 N/A N/A Wound Status: Open N/A  N/A Pending Amputation on Yes N/A N/A Presentation: Measurements L x W x D (cm) 0.5x0.6x0.5 N/A N/A Area (cm) : 0.236 N/A N/A Volume (cm) : 0.118 N/A N/A % Reduction in Area: 62.90% N/A N/A % Reduction in Volume: 7.10% N/A N/A Classification: Grade 1 N/A N/A Exudate Amount: Medium N/A N/A Exudate Type: Serous N/A N/A Exudate Color: amber N/A N/A Wound Margin: Flat and Intact N/A N/A Granulation Amount: Medium (34-66%) N/A N/A Granulation Quality: Pink N/A N/A Necrotic Amount: Medium (34-66%) N/A N/A Exposed Structures: Fat Layer (Subcutaneous Tissue) N/A N/A Exposed: Yes Fascia: No Tendon: No Muscle: No Joint: No Bone: No Epithelialization: Small (1-33%) N/A N/A Treatment Notes  Electronic Signature(s) Signed: 08/15/2019 6:14:49 PM By: Gretta Cool, BSN, RN, CWS, Kim RN, BSN Entered By: Gretta Cool, BSN, RN, CWS, Kim on 08/15/2019 08:45:23 [name], [name] ([id number], [name] ([id number]) -------------------------------------------------------------------------------- [place] Details Patient Name: [name], COPELAN. Date of Service: 08/15/2019 8:15 AM Medical Record Number: [id number] Patient Account Number: [id number] Date of Birth/Sex: 29-Jul-1965 (54 y.o. M) Treating RN: Cornell Barman Primary Care Roshonda Sperl: [name] Other Clinician: Referring Wrenn Willcox: [name] Treating Charnika Herbst/Extender: Melburn Hake, HOYT Weeks in Treatment: 22 Active Inactive Nutrition Nursing Diagnoses: Impaired glucose control: actual or potential Goals: Patient/caregiver verbalizes understanding of need to maintain therapeutic glucose control per primary care physician Date Initiated: 03/13/2019 Target Resolution Date: 04/11/2019 Goal Status: Active Interventions: Assess patient nutrition upon admission and as needed per policy Notes: Orientation to the Wound Care Program Nursing Diagnoses: Knowledge deficit related to the wound healing center  program Goals: Patient/caregiver will verbalize understanding of the [place] Date Initiated: 03/13/2019 Target Resolution Date: 04/11/2019 Goal Status: Active Interventions: Provide education on orientation to the wound center Notes: Wound/Skin Impairment Nursing Diagnoses: Impaired tissue integrity Goals: Ulcer/skin breakdown will have a volume reduction of 30% by week 4 Date Initiated: 03/13/2019 Target Resolution Date: 04/11/2019 Goal Status: Active Interventions: Assess ulceration(s) every visit Notes: Electronic Signature(s) Signed: 08/15/2019 6:14:49 PM By: Gretta Cool, BSN, RN, CWS, Kim RN, BSN Entered By: Gretta Cool, BSN, RN, CWS, Kim on 08/15/2019 08:45:14 [name] ([id number]) -------------------------------------------------------------------------------- Pain Assessment Details Patient Name: [name] Date of Service: 08/15/2019 8:15 AM Medical Record Number: [id number] Patient Account Number: [id number] Date of Birth/Sex: July 05, 1965 (54 y.o. M) Treating RN: Grover Canavan Primary Care Thaddeus Evitts: [name] Other Clinician: Referring Torsten Weniger: [name] Treating Orlene Salmons/Extender: Melburn Hake, HOYT Weeks in Treatment: 22 Active Problems Location of Pain Severity and Description of Pain Patient Has Paino No Site Locations Pain Management and Medication Current Pain Management: Electronic Signature(s) Signed: 08/15/2019 4:49:15 PM By: Grover Canavan Entered By: Grover Canavan on 08/15/2019 08:17:20 [name] ([id number]) -------------------------------------------------------------------------------- Patient/Caregiver Education Details Patient Name: [name] Date of Service: 08/15/2019 8:15 AM Medical Record Number: [id number] Patient Account Number: [id number] Date of Birth/Gender: 24-Dec-1965 (53 y.o. M) Treating RN: Cornell Barman Primary Care Physician: [name] Other Clinician: Referring  Physician: [name] Treating Physician/Extender: Sharalyn Ink in Treatment: 22 Education Assessment Education Provided To: Patient Education Topics Provided Offloading: Handouts: What is Offloadingo, Other: continue casting Methods: Demonstration Responses: State content correctly Electronic Signature(s) Signed: 08/15/2019 6:14:49 PM By: Gretta Cool, BSN, RN, CWS, Kim RN, BSN Entered By: Gretta Cool, BSN, RN, CWS, Kim on 08/15/2019 08:49:33 [name] ([id number]) -------------------------------------------------------------------------------- Wound Assessment Details Patient Name: [name], DAVIDOW. Date of Service: 08/15/2019 8:15 AM Medical Record Number: [id number] Patient Account Number: [id number] Date of Birth/Sex: May 19, 1965 (54 y.o. M) Treating RN: Cornell Barman Primary Care Loredana Medellin: [name] Other Clinician: Referring Elaiza Shoberg: [name] Treating Worth Kober/Extender: Melburn Hake, HOYT Weeks in Treatment: 22 Wound Status Wound Number: 4 Primary Etiology: Diabetic Wound/Ulcer of the Lower Extremity Wound Location: Right, Lateral Foot Wound Status: Open Wounding Event: Gradually Appeared Comorbid History: Hypertension, Type II Diabetes, Neuropathy Date Acquired: 03/12/2019 Weeks Of Treatment: 22 Clustered Wound: No Pending Amputation On Presentation Photos Wound Measurements Length: (cm) 0.5 % Redu Width: (cm) 0.6 % Redu Depth: (cm) 0.5 Epithe Area: (cm) 0.236 [place] Volume: (cm) 0.118 Under ction in Area: 62.9% ction in Volume: 7.1% lialization: Small (1-33%) ling: No mining: No Wound Description Classification: Grade 1 Foul O Wound Margin:  Flat and Intact Slough Exudate Amount: Medium Exudate Type: Serous Exudate Color: amber dor After Cleansing: No /Fibrino Yes Wound Bed Granulation Amount: Medium (34-66%) Exposed Structure Granulation Quality: Pink Fascia Exposed: No Necrotic Amount: Medium (34-66%) Fat Layer  (Subcutaneous Tissue) Exposed: Yes Necrotic Quality: Adherent Slough Tendon Exposed: No Muscle Exposed: No Joint Exposed: No Bone Exposed: No Treatment Notes Wound #4 (Right, Lateral Foot) Notes hydrofera blue, foam ,TCC right [name], [name] ([id number]) Electronic Signature(s) Signed: 08/15/2019 6:14:49 PM By: Gretta Cool, BSN, RN, CWS, Kim RN, BSN Entered By: Gretta Cool, BSN, RN, CWS, Kim on 08/15/2019 08:44:48 [name] ([id number]) -------------------------------------------------------------------------------- Vitals Details Patient Name: [name] Date of Service: 08/15/2019 8:15 AM Medical Record Number: [id number] Patient Account Number: [id number] Date of Birth/Sex: August 09, 1965 (54 y.o. M) Treating RN: Grover Canavan Primary Care Dyani Babel: [name] Other Clinician: Referring Saudia Smyser: [name] Treating Christopher Hink/Extender: Melburn Hake, HOYT Weeks in Treatment: 22 Vital Signs Time Taken: 08:16 Temperature (F): 98.3 Height (in): 74 Pulse (bpm): 76 Weight (lbs): 308 Respiratory Rate (breaths/min): 16 Body Mass Index (BMI): 39.5 Blood Pressure (mmHg): 150/63 Reference Range: 80 - 120 mg / dl Electronic Signature(s) Signed: 08/15/2019 4:49:15 PM By: Grover Canavan Entered By: Grover Canavan on 08/15/2019 08:17:07

## 2019-08-15 NOTE — Progress Notes (Addendum)
JAYION, SCHNECK ([id number]) Visit Report for 08/15/2019 Chief Complaint Document Details Patient Name: [name], [name]. Date of Service: 08/15/2019 8:15 AM Medical Record Number: [id number] Patient Account Number: [id number] Date of Birth/Sex: February 12, 1965 (54 y.o. M) Treating RN: Cornell Barman Primary Care Provider: [name] Other Clinician: Referring Provider: [name] Treating Provider/Extender: Melburn Hake, Alroy Portela Weeks in Treatment: 22 Information Obtained from: Patient Chief Complaint Right lateral foot ulcer Electronic Signature(s) Signed: 08/15/2019 8:29:24 AM By: [name] PA-C Entered By: [name] on 08/15/2019 09:47:09 [name] ([id number]) -------------------------------------------------------------------------------- Debridement Details Patient Name: [name] Date of Service: 08/15/2019 8:15 AM Medical Record Number: [id number] Patient Account Number: [id number] Date of Birth/Sex: [birth year] (54 y.o. M) Treating RN: Cornell Barman Primary Care Provider: [name] Other Clinician: Referring Provider: [name] Treating Provider/Extender: Melburn Hake, Tawonna Esquer Weeks in Treatment: 22 Debridement Performed for Wound #4 Right,Lateral Foot Assessment: Performed By: Physician STONE III, Mico Spark E., PA-C Debridement Type: Debridement Severity of Tissue Pre Debridement: Fat layer exposed Level of Consciousness (Pre- Awake and Alert procedure): Pre-procedure Verification/Time Out Yes - 08:45 Taken: Total Area Debrided (L x W): 0.5 (cm) x 0.6 (cm) = 0.3 (cm) Tissue and other material Viable, Non-Viable, Callus, Slough, Subcutaneous, Slough debrided: Level: Skin/Subcutaneous Tissue Debridement Description: Excisional Instrument: Curette Bleeding: Minimum Hemostasis Achieved: Pressure Response to Treatment: Procedure was tolerated well Level of Consciousness (Post- Awake and Alert procedure): Post Debridement  Measurements of Total Wound Length: (cm) 0.6 Width: (cm) 0.6 Depth: (cm) 0.5 Volume: (cm) 0.141 Character of Wound/Ulcer Post Debridement: Stable Severity of Tissue Post Debridement: Fat layer exposed Post Procedure Diagnosis Same as Pre-procedure Electronic Signature(s) Signed: 08/15/2019 5:00:44 PM By: [name] PA-C Signed: 08/15/2019 6:14:49 PM By: Gretta Cool, BSN, RN, CWS, Kim RN, BSN Entered By: Gretta Cool, BSN, RN, CWS, Kim on 08/15/2019 08:46:36 [name] ([id number]) -------------------------------------------------------------------------------- HPI Details Patient Name: [name] Date of Service: 08/15/2019 8:15 AM Medical Record Number: [id number] Patient Account Number: [id number] Date of Birth/Sex: Jul 01, 1965 (54 y.o. M) Treating RN: Cornell Barman Primary Care Provider: [name] Other Clinician: Referring Provider: [name] Treating Provider/Extender: Melburn Hake, Eliodoro Gullett Weeks in Treatment: 22 History of Present Illness HPI Description: This 54 year old male comes with an ulcerated area on the plantar aspect of the right foot which she's had for approximately a month. I have known him from a previous visit at [facility] wound center and was treated in the months of April and May 2016 and rapidly healed a left plantar ulcer with a total contact cast. He has been a diabetic for about 16 years and tries to keep active and is fairly compliant with his diabetes management. 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 Past medical history significant for hypertension, hyperlipidemia, and status post appendectomy 1993. He does not smoke or drink alcohol. 01/14/2015 -- the patient had tolerated his total contact cast very well and had no problems and has had no systemic symptoms. However when his total contact cast was cut open he had excessive amount of purulent drainage in spite of being on antibiotics. He had had a recent x-ray done in the ER  12/22/2014 which showed IMPRESSION:No evidence of osseous erosion. Known soft tissue ulceration is not well characterized on radiograph. Scattered vascular calcifications seen. his last hemoglobin A1c in December was 7.3. He has been on Augmentin and doxycycline for the last 2 weeks. 01/21/2015 -- his culture grew rare growth of Pantoea species an MR moderate growth of Candida parapsilosis. it is  sensitive to levofloxacin. He has not heard back from the insurance company regarding his hyperbaric oxygen therapy. His MRI has not been done yet and we will try and get him an earlier date 01/28/2015 -- MRI was done last night -- IMPRESSION:1. Soft tissue ulcer overlying the plantar aspect of the fifth metatarsal head extending to the cortex. Subcortical marrow edema in the fifth metatarsal head with corresponding T1 hypointensity is concerning for early osteomyelitis of the plantar lateral aspect of the fifth metatarsal head. Chest x-ray done on 01/14/2015 shows bronchiectatic changes without infiltrate. EKG done on generally 17 2017 shows a normal sinus rhythm and is a normal EKG. 02/04/2015 -- he was asked to see Dr. [name] last week and had 2 appointments but had to cancel both due to pressures of work. Last night he has woken up with severe pain in the foot and leg and it is swollen up. No fever or no change in his blood glucose. Addendum: I spoke with Dr. [name] who kindly agreed to accept the patient for inpatient therapy and have also opened to the hospitalist Dr. [name], and discuss details of the management including PICC line and repeat cultures. 02/12/2015-- -- was seen by Dr. [name] in the hospital and a PICC line was placed. He was to receive Ceftazidime 2 g every 12 hourly, oral levofloxacin 750 mg every 24 hourly and oral fluconazole 200 mg daily. The antibiotics were to be given for 4 weeks except the Diflucan was to be given for the first 2 weeks. Reviewed note from 02/10/2015  -- and Dr. [name] had recommended management for growth of MSSA and Serratia. He switched him from ceftazidime to ceftriaxone 2 g every 24 hours. Levofloxacin was stopped and he would continue on fluconazole for another week. He had asked me to decide whether further imaging was necessary and whether surgical debridement of the infected bone was needed. He is doing well and has been off work for this week and we will keep him off the next week. 02/22/2015 -- he was seen by my colleague on 01/19/2015 and at that time an incision and drainage was done on his right lateral forefoot on the dorsum. Today when I probed this wound it is frankly draining pus and it communicates with the ulcer on the plantar aspect of his right foot. The patient is still on IV ceftriaxone 2 g every 24 hours and is to be seen by Dr. [name] on Friday. 03/19/2015 -- On 03/04/2015 I spoke to Dr. 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 He recommended operative debridement which would probably result in the fifth metatarsal head and toe amputation.The patient would be referred back to [place] once he was done with surgery. He was admitted to [facility] yesterday and had surgery done by podiatry for a right fifth metatarsal acute osteomyelitis with cellulitis and abscess. He had a right foot incision and drainage with fifth metatarsal partial amputation and removal of toe infected bone and soft tissue with cultures. The wound was partially closed and packing of the distal end was done. Patient was already on cefepime 2 g IV every 8 hourly and put on vancomycin pending final cultures. He had grown moderate gram-negative rods, and later found to be rare diphtheroids. I received a call from Dr. Cannon Kettle the podiatrist and  we discussed the above. On 03/10/2015 he  was found positive for influenza a and has been put on Tamiflu. Since his discharge he has been seen by Dr. Cannon Kettle who is planning to remove his sutures this coming week. He was reviewed by Dr. [name] on 03/17/2015 and his Diflucan was stopped and vancomycin. After the 3 doses he is taking. He is going to change Ceftazidime to Zosyn 3.375 g IV every 8 hours. 03/25/2015 -- he was seen by the podiatrist a couple of days ago and the sutures were removed. She will follow back with him in 4 weeks' time and at that time x-rays will be taken and a custom molded insert would be made for his shoe. 04/01/2015 -- he was seen by Dr. [name] on 03/29/2015 who pulled the PICC line stopped his IV antibiotics and recommended starting doxycycline and levofloxacin for 2 weeks. He also stop the fluconazole. The patient will follow up with him only when necessary. Readmission: 03/15/17 on evaluation today patient presents for initial evaluation concerning the new issues although he has previously been evaluated in our clinic. Unfortunately the previous evaluation led to the patient having to proceed to amputation and so he is somewhat nervous about being here REAL, CONA ([id number]) today. With that being said he has a very slight blister that occurred on the plantar aspect more medial on the right great toe that has been present for just a very short amount of time, several days. With that being said he felt like initially when he called that he somewhat overreacted but due to the fact that his previous issue led to amputation he is very cautious these days I explained that he did the right thing. With that being said he has been tolerating the dressing changes without complication mainly he just been covering this. He does not have any discomfort in secondary to neuropathy it's unlikely that he feels much. With that being said he does tell me that the issue that he had here is that he went barefoot  when he knows he should not have which subsequently led to the blisters. He states is definitely not doing that anymore. His most recent hemoglobin A1c was December and registered 6.9 his ABI today was 1.1. 03/27/17 on evaluation today patient appears to be doing great in regard to his right great toe ulcer. He has been tolerating the dressing changes without complication. The good news is this is making excellent progress and he seems to be caring for this in an excellent fashion. I see no evidence of breakdown that would make me concerned that he was at risk for infection/amputation. This has obviously been his concern due to the fifth toe amputation that was necessitated previous when he had a similar issue. Nonetheless this seems to be progressing much more nicely. Readmission: 03/13/2019 upon evaluation today patient presents for reevaluation here in the clinic concerning issues he has been having with his right foot on the lateral portion at the proximal end of the metatarsal. Subsequently he tells me that this just opened up in the past couple of days or so and the erythema really began about 24 to 48 hours ago. Fortunately there is no signs of systemic infection. 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 That left him with this bony prominence that is where the wound is currently. There is erythema surrounding that does have me concerned about cellulitis. With that being said he was  noncompressible as far as ABIs are concerned I do think we can need to check into arterial studies at this point. Patient does have a history of hypertension along with diabetes mellitus type 2. He also tends to develop quite a bit of callus white often. 03/20/2019 upon evaluation today patient appears to be doing very well in regard to his wounds currently. He has been tolerating the dressing changes without complication. Fortunately there is no signs of infection. His  ABIs were good at this point and registered at 1.07 on the left and 1.09 on the right. In regard to the x-ray this was negative as well for any signs of osteomyelitis. The patient also seems to be doing better in regard to the infection. He still has several days of the antibiotic left at this point best the Bactrim DS and he seems to be doing very well with this. Overall I am extremely pleased with the progress in 1 week's time he is going require some sharp debridement today however. 03/27/2019 upon evaluation today patient actually appears to be making some progress in regard to his wound. It is a little deeper but measuring smaller which is good news due to the fact that again were clear away some of the necrotic tissue which is why the depth is increasing a little bit. Nonetheless after like were getting very close to being down at a good wound bed. Fortunately there is no signs of active infection at this time. There is a little bit of erythema immediately surrounding the wound that we do want to be very cognizant of and careful about. For that reason I am going to go ahead and extend his antibiotic today for an additional 10 days that is the Bactrim. 04/03/2019 upon evaluation today patient appears to be making some progress. I do feel like the Annitta Needs is doing a good job along with the alginate is being packed into the wound space behind. He has been tolerating the dressing changes without complication. Fortunately there is no signs of active infection at this time. No fevers, chills, nausea, vomiting, or diarrhea. 04/10/2019 upon evaluation today patient appears to be making progress. The wound is not quite as deep but he still does not have a great wound surface yet for working on this and the Santyl does seem to be cleaning things up. Fortunately there is no evidence of active infection at this time. No fevers, chills, nausea, vomiting, or diarrhea. 04/17/2019 upon evaluation today patient appears  to be doing excellent at this time in regard to his foot ulcer. I do believe the Santyl with the alginate packed in behind has been of benefit for him and I am very pleased in this regard. With that being said I am feeling like the wound surface is improving quite a bit each time I see him still I do not believe we are at the point of stating that the wound bed is perfect but again were making progress. The patient is going require some sharp debridement today. 4/22; this is a patient who is using Santyl with calcium alginate. Apparently his wound dimensions have been improving. Patient is changing the dressing himself. He has a surgical shoe. He works in a sitting position therefore is not on his feet all that much. There is undermining 05/01/2019 upon evaluation today patient appears to be doing a little better in regard to the size of his wound though he still has some depth. This does seem to be much cleaner has  been using Santyl on the base of the wound followed by packing with silver alginate and behind. Nonetheless I do believe that based on what we are seeing we may need to switch to a collagen type dressing endoform may actually be excellent for him. 05/08/2019 upon evaluation today patient's wound actually appears to be showing better granulation tissue in the base of the wound. With that being said he does have some epiboly noted around the edges of the wound especially plantar and more distal. Subsequently this is can require sharp debridement today. Fortunately there is no signs of active systemic infection though I do feel like there is some local cellulitis noted at this point. The patient also notes has had increased drainage. 5/13; wound volume quite a bit improved this week down to 3 mm. Patient states pain and drainage better. Culture from last week grew staph aureus [not MRSA] and this is sensitive to quinolones and he is on Levaquin. HOWEVER he also grew abundant Enterococcus faecalis  treatment of choice for this is ampicillin. Quinolone coverage is unreliable 5/20 completing the Augmentin. Small wound on the right lateral foot. Thick rolled edges around the wound. Using silver alginate 05/29/2019 upon evaluation today patient appears to be doing better compared to last time I saw him. Fortunately there is no signs of active infection and I do feel like he is actually making good progress. Overall the quality of the wound bed along with the size looks improved and the inflammation/erythema that was previously noted by myself when I saw the couple weeks back actually has also resolved and this is great. Overall very pleased with how things stand. 06/06/2019 upon evaluation today patient actually appears to be doing quite well with regard to his foot ulcer with regard to some of the granulation were seen at this point. There does not appear to be any evidence of systemic or local infection although he still has discomfort I feel like things are moving in a slow but surely correct direction. I think we may need to help fill some of the space however even though he is using the collagen we may need to have something behind this such as a plain packing strip to try to help hold the collagen in the base of the wound. 06/12/2019 upon evaluation today patient appears to be doing well with regard to his foot ulcer. This seems to be making some slow but steady progress. The wound does not appear to be as deep today compared to where it was previous. Fortunately there is no signs of active infection at this time. No fevers, chills, nausea, vomiting, or diarrhea. 06/19/2019 on evaluation today patient's wound does not appear to be doing quite as well as what I would like to see. Fortunately there is no signs of systemic infection at this time which is great news. With that being said I do believe that the patient needs to have a wound culture as I do MARICO, BUCKLE. ([id number]) believe he  likely has a local infection that needs to be addressed. He is also can require some debridement today as well. 06/26/2019 upon evaluation today patient's wound actually appears to be showing signs of excellent improvement today. There does not appear to be any evidence of active infection which is great news. There does not appear to be significant erythema at this point. The patient does have improvement overall in the appearance of the infection he has been taking the Augmentin the culture came back showing no  organism was grown at this point but nonetheless I do believe he has been doing well with the antibiotics I would continue that at this point. 07/03/2019 upon evaluation today patient appears to be doing about the same in regard to his wound. There does not appear to be any evidence of active infection and overall I am pleased with where things stand. The patient is tolerating the dressing changes without complication but it does not appear that they are packing the wound at this time which I think is going to slow down his healing prospects. 07/10/2019 upon evaluation today patient appears to be doing little better in my opinion in regard to the overall moisture collected in the base of the wound. They have been packing this with a little bit of silver alginate and that has done quite well. Overall I am pleased in that regard. Subsequently the redness does not appear to be any worse today I did actually go ahead and marked this since this can be 2 weeks between now and when I see him again I would make sure that if anything changes or worsens he will be able to notice this and go ahead and initiate treatment with the Augmentin. 07/22/2019 patient comes in today a little bit early for evaluation secondary to the fact that he was having issues with increased drainage and concern about infection. With that being said he does appear to have developed a blister area again proximal to where the original  wound opening was in although the wound bed itself actually appears to be doing somewhat better he has had increased drainage and tracking of fluid here due to this blister. Nonetheless I believe that that does need to be cleaned away today but again I am not really certain that I see a lot of evidence for infection at this point. 07/29/2019 on evaluation today patient actually appears to be doing better in regard to his foot ulcer. There is improvement compared to last week which is great news. Nonetheless he does come prepared to apply the total contact cast today which I think would be beneficial for him. He knows he is not supposed to drive he did bring his son with him today in order to drive him. 07/31/2019 upon evaluation today patient appears to be doing well with regard to his wound today. I do not see any signs of infection I do believe that he is definitely showing some signs of improvement here in the office in a couple days as far as the overall erythema and pressure getting to the wound area is concerned. With that being said I do believe that the cast has been of great benefit for him. 08/08/2019 upon evaluation today patient appears to be doing better in regard to his wound. Overall I feel like he is showing signs of improvement. The external measurement is really not bad at all although I did remove some of the callus in order to open up the wound itself the depth is actually coming quite well and I am extremely pleased in that regard I do not think we can to see much improvement externally until we get the internal depth filled in and that again is improved. Fortunately there is no signs of active infection at this time. No fevers, chills, nausea, vomiting, or diarrhea. 08/15/2019 on evaluation today patient actually appears to be doing well with regard to his foot ulcer. This is showing some signs of minimal improvement from the standpoint of the depth and  appearance of the wound as  well as the edges of the wound. It is taking its time and to be honest I do believe that he seems to be progressing well. I am happy with the cast and what it is achieving at this point. Have contemplated checking into Dermagraft for Apligraf for the patient but again he does seem to be making fairly good progress in my opinion. If it has not tremendously changed come next week I may consider one of the 2. Electronic Signature(s) Signed: 08/15/2019 9:33:04 AM By: [name] PA-C Entered By: [name] on 08/15/2019 09:33:03 [name] ([id number]) -------------------------------------------------------------------------------- Physical Exam Details Patient Name: NICHOLES, HIBLER Date of Service: 08/15/2019 8:15 AM Medical Record Number: [id number] Patient Account Number: [id number] Date of Birth/Sex: 08/11/1965 (54 y.o. M) Treating RN: Cornell Barman Primary Care Provider: [name] Other Clinician: Referring Provider: [name] Treating Provider/Extender: STONE III, Brookley Spitler Weeks in Treatment: 39 Constitutional Well-nourished and well-hydrated in no acute distress. Respiratory normal breathing without difficulty. Psychiatric this patient is able to make decisions and demonstrates good insight into disease process. Alert and Oriented x 3. pleasant and cooperative. Notes Upon inspection patient's wound bed actually showed signs of good granulation at this time there was some slough noted I did perform sharp debridement to clear away some of the necrotic debris he tolerated that today without complication post debridement wound bed appears to be doing much better which is great news. Electronic Signature(s) Signed: 08/15/2019 9:33:20 AM By: [name] PA-C Entered By: [name] on 08/15/2019 09:33:20 [name] ([id number]) -------------------------------------------------------------------------------- Physician Orders Details Patient  Name: [name] Date of Service: 08/15/2019 8:15 AM Medical Record Number: [id number] Patient Account Number: [id number] Date of Birth/Sex: [birth year] (54 y.o. 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 - in office o Dial antibacterial soap, wash wounds, rinse and pat dry prior to dressing wounds Anesthetic (add to Medication List) Wound #4 Right,Lateral Foot o Topical Lidocaine 4% cream applied to wound bed prior to debridement (In Clinic Only). Primary Wound Dressing Wound #4 Right,Lateral Foot o Hydrafera Blue Ready Transfer - one piece inside wound 2nd piece laid on top Secondary Dressing Wound #4 Right,Lateral Foot o Foam - 3rd on bony prominences Dressing Change Frequency Wound #4 Right,Lateral Foot o Change dressing every week Follow-up Appointments Wound #4 Right,Lateral Foot o Return Appointment in 1 week. Off-Loading Wound #4 Right,Lateral Foot o Total Contact Cast to Right Lower Extremity Electronic Signature(s) Signed: 08/15/2019 5:00:44 PM By: [name] PA-C Signed: 08/15/2019 6:14:49 PM By: Gretta Cool, BSN, RN, CWS, Kim RN, BSN Entered By: Gretta Cool, BSN, RN, CWS, Kim on 08/15/2019 08:48:53 [name] ([id number]) -------------------------------------------------------------------------------- Problem List Details Patient Name: MARQUON, ALCALA. Date  of Service: 08/15/2019 8:15 AM Medical Record Number: [id number] Patient Account Number: [id number] Date of Birth/Sex: 23-Nov-1965 (54 y.o. M) Treating RN: Cornell Barman Primary Care Provider: [name] Other Clinician: Referring Provider: [name] Treating Provider/Extender: Melburn Hake, Raeanna Soberanes Weeks in Treatment:  56 Active Problems ICD-10 Encounter Code Description Active Date MDM Diagnosis E11.621 Type 2 diabetes mellitus with foot ulcer 03/13/2019 No Yes L97.512 Non-pressure chronic ulcer of other part of right foot with fat layer 03/13/2019 No Yes exposed L03.115 Cellulitis of right lower limb 05/15/2019 No Yes L84 Corns and callosities 03/13/2019 No Yes I10 Essential (primary) hypertension 03/13/2019 No Yes Z89.422 Acquired absence of other left toe(s) 03/13/2019 No Yes Inactive Problems Resolved Problems Electronic Signature(s) Signed: 08/15/2019 8:29:18 AM By: [name] PA-C Entered By: [name] on 08/15/2019 08:29:17 [name] ([id number]) -------------------------------------------------------------------------------- Progress Note Details Patient Name: [name] Date of Service: 08/15/2019 8:15 AM Medical Record Number: [id number] Patient Account Number: [id number] Date of Birth/Sex: 01-28-1965 (54 y.o. M) Treating RN: Cornell Barman Primary Care Provider: [name] Other Clinician: Referring Provider: [name] Treating Provider/Extender: Melburn Hake, Kadince Boxley Weeks in Treatment: 22 Subjective Chief Complaint Information obtained from Patient Right lateral foot ulcer History of Present Illness (HPI) This 54 year old male comes with an ulcerated area on the plantar aspect of the right foot which she's had for approximately a month. I have known him from a previous visit at [facility] wound center and was treated in the months of April and May 2016 and rapidly healed a left plantar ulcer with a total contact cast. He has been a  diabetic for about 16 years and tries to keep active and is fairly compliant with his diabetes management. 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 Past medical history significant for hypertension, hyperlipidemia, and status post appendectomy 1993. He does not smoke or drink alcohol. 01/14/2015 -- the patient had tolerated his total contact cast very well and had no problems and has had no systemic symptoms. However when his total contact cast was cut open he had excessive amount of purulent drainage in spite of being on antibiotics. He had had a recent x-ray done in the ER 12/22/2014 which showed IMPRESSION:No evidence of osseous erosion. Known soft tissue ulceration is not well characterized on radiograph. Scattered vascular calcifications seen. his last hemoglobin A1c in December was 7.3. He has been on Augmentin and doxycycline for the last 2 weeks. 01/21/2015 -- his culture grew rare growth of Pantoea species an MR moderate growth of Candida parapsilosis. it is sensitive to levofloxacin. He has not heard back from the insurance company regarding his hyperbaric oxygen therapy. His MRI has not been done yet and we will try and get him an earlier date 01/28/2015 -- MRI was done last night -- IMPRESSION:1. Soft tissue ulcer overlying the plantar aspect of the fifth metatarsal head extending to the cortex. Subcortical marrow edema in the fifth metatarsal head with corresponding T1 hypointensity is concerning for early osteomyelitis of the plantar lateral aspect of the fifth metatarsal head. Chest x-ray done on 01/14/2015 shows bronchiectatic changes without infiltrate. EKG done on generally 17 2017 shows a normal sinus rhythm and is a normal EKG. 02/04/2015 -- he was asked to see Dr. [name] last week and had 2 appointments but had to cancel both due to pressures of work. Last night he has woken up with severe pain in the foot and leg and it is swollen up. No fever or no change in his blood  glucose. Addendum: I spoke with Dr. [name] who kindly agreed to accept the patient for inpatient therapy and have also opened to the hospitalist Dr. [name], and discuss details of the management including PICC line and repeat cultures. 02/12/2015-- -- was seen by Dr.  Fitzgerald in the hospital and a PICC line was placed. He was to receive Ceftazidime 2 g every 12 hourly, oral levofloxacin 750 mg every 24 hourly and oral fluconazole 200 mg daily. The antibiotics were to be given for 4 weeks except the Diflucan was to be given for the first 2 weeks. Reviewed note from 02/10/2015 -- and Dr. [name] had recommended management for growth of MSSA and Serratia. He switched him from ceftazidime to ceftriaxone 2 g every 24 hours. Levofloxacin was stopped and he would continue on fluconazole for another week. He had asked me to decide whether further imaging was necessary and whether surgical debridement of the infected bone was needed. He is doing well and has been off work for this week and we will keep him off the next week. 02/22/2015 -- he was seen by my colleague on 01/19/2015 and at that time an incision and drainage was done on his right lateral forefoot on the dorsum. Today when I probed this wound it is frankly draining pus and it communicates with the ulcer on the plantar aspect of his right foot. The patient is still on IV ceftriaxone 2 g every 24 hours and is to be seen by Dr. [name] on Friday. 03/19/2015 -- On 03/04/2015 I spoke to Dr. 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 He recommended operative debridement which would probably result in the fifth metatarsal head and toe amputation.The patient would be referred back to [place] once he was done with surgery. He was admitted to [facility] yesterday and had surgery done by  podiatry for a right fifth metatarsal acute osteomyelitis with cellulitis and abscess. He had a right foot incision and drainage with fifth metatarsal partial amputation and removal of toe infected bone and soft tissue with cultures. The wound was partially closed and packing of the distal end was done. Patient was already on cefepime 2 g IV every 8 hourly and put on vancomycin pending final cultures. He had grown moderate gram-negative rods, and later found to be rare diphtheroids. I received a call from Dr. Cannon Kettle the podiatrist and we discussed the above. On 03/10/2015 he was found positive for influenza a and has been put on Tamiflu. Since his discharge he has been seen by Dr. Cannon Kettle who is planning to remove his sutures this coming week. He was reviewed by Dr. [name] on 03/17/2015 and his Diflucan was stopped and vancomycin. After the 3 doses he is taking. He is going to change Ceftazidime to Zosyn 3.375 g IV every 8 hours. 03/25/2015 -- he was seen by the podiatrist a couple of days ago and the sutures were removed. She will follow back with him in 4 weeks' time and at that time x-rays will be taken and a custom molded insert would be made for his shoe. 04/01/2015 -- he was seen by Dr. [name] on 03/29/2015 who pulled the PICC line stopped his IV antibiotics and recommended starting doxycycline and levofloxacin for 2 weeks. He also stop the fluconazole. The patient will follow up with him only when necessary. ALPER, GUILMETTE ([id number]) Readmission: 03/15/17 on evaluation today patient presents for initial evaluation concerning the new issues although he has previously been evaluated in our clinic. Unfortunately the previous evaluation led to the patient having to proceed to amputation and so he  is somewhat nervous about being here today. With that being said he has a very slight blister that occurred on the plantar aspect more medial on the right great toe that has been present  for just a very short amount of time, several days. With that being said he felt like initially when he called that he somewhat overreacted but due to the fact that his previous issue led to amputation he is very cautious these days I explained that he did the right thing. With that being said he has been tolerating the dressing changes without complication mainly he just been covering this. He does not have any discomfort in secondary to neuropathy it's unlikely that he feels much. With that being said he does tell me that the issue that he had here is that he went barefoot when he knows he should not have which subsequently led to the blisters. He states is definitely not doing that anymore. His most recent hemoglobin A1c was December and registered 6.9 his ABI today was 1.1. 03/27/17 on evaluation today patient appears to be doing great in regard to his right great toe ulcer. He has been tolerating the dressing changes without complication. The good news is this is making excellent progress and he seems to be caring for this in an excellent fashion. I see no evidence of breakdown that would make me concerned that he was at risk for infection/amputation. This has obviously been his concern due to the fifth toe amputation that was necessitated previous when he had a similar issue. Nonetheless this seems to be progressing much more nicely. Readmission: 03/13/2019 upon evaluation today patient presents for reevaluation here in the clinic concerning issues he has been having with his right foot on the lateral portion at the proximal end of the metatarsal. Subsequently he tells me that this just opened up in the past couple of days or so and the erythema really began about 24 to 48 hours ago. Fortunately there is no signs of systemic infection. 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 That left him with this bony prominence that is where the wound is  currently. There is erythema surrounding that does have me concerned about cellulitis. With that being said he was noncompressible as far as ABIs are concerned I do think we can need to check into arterial studies at this point. Patient does have a history of hypertension along with diabetes mellitus type 2. He also tends to develop quite a bit of callus white often. 03/20/2019 upon evaluation today patient appears to be doing very well in regard to his wounds currently. He has been tolerating the dressing changes without complication. Fortunately there is no signs of infection. His ABIs were good at this point and registered at 1.07 on the left and 1.09 on the right. In regard to the x-ray this was negative as well for any signs of osteomyelitis. The patient also seems to be doing better in regard to the infection. He still has several days of the antibiotic left at this point best the Bactrim DS and he seems to be doing very well with this. Overall I am extremely pleased with the progress in 1 week's time he is going require some sharp debridement today however. 03/27/2019 upon evaluation today patient actually appears to be making some progress in regard to his wound. It is a little deeper but measuring smaller which is good news due to the fact that again  were clear away some of the necrotic tissue which is why the depth is increasing a little bit. Nonetheless after like were getting very close to being down at a good wound bed. Fortunately there is no signs of active infection at this time. There is a little bit of erythema immediately surrounding the wound that we do want to be very cognizant of and careful about. For that reason I am going to go ahead and extend his antibiotic today for an additional 10 days that is the Bactrim. 04/03/2019 upon evaluation today patient appears to be making some progress. I do feel like the Annitta Needs is doing a good job along with the alginate is being packed into the  wound space behind. He has been tolerating the dressing changes without complication. Fortunately there is no signs of active infection at this time. No fevers, chills, nausea, vomiting, or diarrhea. 04/10/2019 upon evaluation today patient appears to be making progress. The wound is not quite as deep but he still does not have a great wound surface yet for working on this and the Santyl does seem to be cleaning things up. Fortunately there is no evidence of active infection at this time. No fevers, chills, nausea, vomiting, or diarrhea. 04/17/2019 upon evaluation today patient appears to be doing excellent at this time in regard to his foot ulcer. I do believe the Santyl with the alginate packed in behind has been of benefit for him and I am very pleased in this regard. With that being said I am feeling like the wound surface is improving quite a bit each time I see him still I do not believe we are at the point of stating that the wound bed is perfect but again were making progress. The patient is going require some sharp debridement today. 4/22; this is a patient who is using Santyl with calcium alginate. Apparently his wound dimensions have been improving. Patient is changing the dressing himself. He has a surgical shoe. He works in a sitting position therefore is not on his feet all that much. There is undermining 05/01/2019 upon evaluation today patient appears to be doing a little better in regard to the size of his wound though he still has some depth. This does seem to be much cleaner has been using Santyl on the base of the wound followed by packing with silver alginate and behind. Nonetheless I do believe that based on what we are seeing we may need to switch to a collagen type dressing endoform may actually be excellent for him. 05/08/2019 upon evaluation today patient's wound actually appears to be showing better granulation tissue in the base of the wound. With that being said he does have  some epiboly noted around the edges of the wound especially plantar and more distal. Subsequently this is can require sharp debridement today. Fortunately there is no signs of active systemic infection though I do feel like there is some local cellulitis noted at this point. The patient also notes has had increased drainage. 5/13; wound volume quite a bit improved this week down to 3 mm. Patient states pain and drainage better. Culture from last week grew staph aureus [not MRSA] and this is sensitive to quinolones and he is on Levaquin. HOWEVER he also grew abundant Enterococcus faecalis treatment of choice for this is ampicillin. Quinolone coverage is unreliable 5/20 completing the Augmentin. Small wound on the right lateral foot. Thick rolled edges around the wound. Using silver alginate 05/29/2019 upon evaluation today patient appears  to be doing better compared to last time I saw him. Fortunately there is no signs of active infection and I do feel like he is actually making good progress. Overall the quality of the wound bed along with the size looks improved and the inflammation/erythema that was previously noted by myself when I saw the couple weeks back actually has also resolved and this is great. Overall very pleased with how things stand. 06/06/2019 upon evaluation today patient actually appears to be doing quite well with regard to his foot ulcer with regard to some of the granulation were seen at this point. There does not appear to be any evidence of systemic or local infection although he still has discomfort I feel like things are moving in a slow but surely correct direction. I think we may need to help fill some of the space however even though he is using the collagen we may need to have something behind this such as a plain packing strip to try to help hold the collagen in the base of the wound. 06/12/2019 upon evaluation today patient appears to be doing well with regard to his foot  ulcer. This seems to be making some slow but steady progress. The wound does not appear to be as deep today compared to where it was previous. Fortunately there is no signs of active infection at KWABENA, STRUTZ. ([id number]) this time. No fevers, chills, nausea, vomiting, or diarrhea. 06/19/2019 on evaluation today patient's wound does not appear to be doing quite as well as what I would like to see. Fortunately there is no signs of systemic infection at this time which is great news. With that being said I do believe that the patient needs to have a wound culture as I do believe he likely has a local infection that needs to be addressed. He is also can require some debridement today as well. 06/26/2019 upon evaluation today patient's wound actually appears to be showing signs of excellent improvement today. There does not appear to be any evidence of active infection which is great news. There does not appear to be significant erythema at this point. The patient does have improvement overall in the appearance of the infection he has been taking the Augmentin the culture came back showing no organism was grown at this point but nonetheless I do believe he has been doing well with the antibiotics I would continue that at this point. 07/03/2019 upon evaluation today patient appears to be doing about the same in regard to his wound. There does not appear to be any evidence of active infection and overall I am pleased with where things stand. The patient is tolerating the dressing changes without complication but it does not appear that they are packing the wound at this time which I think is going to slow down his healing prospects. 07/10/2019 upon evaluation today patient appears to be doing little better in my opinion in regard to the overall moisture collected in the base of the wound. They have been packing this with a little bit of silver alginate and that has done quite well. Overall I am pleased in  that regard. Subsequently the redness does not appear to be any worse today I did actually go ahead and marked this since this can be 2 weeks between now and when I see him again I would make sure that if anything changes or worsens he will be able to notice this and go ahead and initiate treatment with the Augmentin.  07/22/2019 patient comes in today a little bit early for evaluation secondary to the fact that he was having issues with increased drainage and concern about infection. With that being said he does appear to have developed a blister area again proximal to where the original wound opening was in although the wound bed itself actually appears to be doing somewhat better he has had increased drainage and tracking of fluid here due to this blister. Nonetheless I believe that that does need to be cleaned away today but again I am not really certain that I see a lot of evidence for infection at this point. 07/29/2019 on evaluation today patient actually appears to be doing better in regard to his foot ulcer. There is improvement compared to last week which is great news. Nonetheless he does come prepared to apply the total contact cast today which I think would be beneficial for him. He knows he is not supposed to drive he did bring his son with him today in order to drive him. 07/31/2019 upon evaluation today patient appears to be doing well with regard to his wound today. I do not see any signs of infection I do believe that he is definitely showing some signs of improvement here in the office in a couple days as far as the overall erythema and pressure getting to the wound area is concerned. With that being said I do believe that the cast has been of great benefit for him. 08/08/2019 upon evaluation today patient appears to be doing better in regard to his wound. Overall I feel like he is showing signs of improvement. The external measurement is really not bad at all although I did remove some  of the callus in order to open up the wound itself the depth is actually coming quite well and I am extremely pleased in that regard I do not think we can to see much improvement externally until we get the internal depth filled in and that again is improved. Fortunately there is no signs of active infection at this time. No fevers, chills, nausea, vomiting, or diarrhea. 08/15/2019 on evaluation today patient actually appears to be doing well with regard to his foot ulcer. This is showing some signs of minimal improvement from the standpoint of the depth and appearance of the wound as well as the edges of the wound. It is taking its time and to be honest I do believe that he seems to be progressing well. I am happy with the cast and what it is achieving at this point. Have contemplated checking into Dermagraft for Apligraf for the patient but again he does seem to be making fairly good progress in my opinion. If it has not tremendously changed come next week I may consider one of the 2. Objective Constitutional Well-nourished and well-hydrated in no acute distress. Vitals Time Taken: 8:16 AM, Height: 74 in, Weight: 308 lbs, BMI: 39.5, Temperature: 98.3 F, Pulse: 76 bpm, Respiratory Rate: 16 breaths/min, Blood Pressure: 150/63 mmHg. Respiratory normal breathing without difficulty. Psychiatric this patient is able to make decisions and demonstrates good insight into disease process. Alert and Oriented x 3. pleasant and cooperative. General Notes: Upon inspection patient's wound bed actually showed signs of good granulation at this time there was some slough noted I did perform sharp debridement to clear away some of the necrotic debris he tolerated that today without complication post debridement wound bed appears to be doing much better which is great news. Integumentary (Hair, Skin) Wound #4  status is Open. Original cause of wound was Gradually Appeared. The wound is located on the  Right,Lateral Foot. The wound measures 0.5cm length x 0.6cm width x 0.5cm depth; 0.236cm^2 area and 0.118cm^3 volume. There is Fat Layer (Subcutaneous Tissue) Exposed RA, PFIESTER. ([id number]) exposed. There is no tunneling or undermining noted. There is a medium amount of serous drainage noted. The wound margin is flat and intact. There is medium (34-66%) pink granulation within the wound bed. There is a medium (34-66%) amount of necrotic tissue within the wound bed including Adherent Slough. Assessment Active Problems ICD-10 Type 2 diabetes mellitus with foot ulcer 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d Cellulitis of right lower limb Corns and callosities Essential (primary) hypertension Acquired absence of other left toe(s) Procedures Wound #4 Pre-procedure diagnosis of Wound #4 is a Diabetic Wound/Ulcer of the Lower Extremity located on the Right,Lateral Foot .Severity of Tissue Pre Debridement is: Fat layer exposed. There was a Excisional Skin/Subcutaneous Tissue Debridement with a total area of 0.3 sq cm performed by STONE III, Slaton Reaser E., PA-C. With the following instrument(s): Curette to remove Viable and Non-Viable tissue/material. Material removed includes Callus, Subcutaneous Tissue, and Slough. No specimens were taken. A time out was conducted at 08:45, prior to the start of the procedure. A Minimum amount of bleeding was controlled with Pressure. The procedure was tolerated well. Post Debridement Measurements: 0.6cm length x 0.6cm width x 0.5cm depth; 0.141cm^3 volume. Character of Wound/Ulcer Post Debridement is stable. Severity of Tissue Post Debridement is: Fat layer exposed. 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 There was a Total Contact Cast Procedure by STONE III, Aunisty Reali E., PA-C. Post  procedure Diagnosis Wound #4: Same as Pre-Procedure Notes: SIze 3. Plan Wound Cleansing: Wound #4 Right,Lateral Foot: Clean wound with Normal Saline. - in office Dial antibacterial soap, wash wounds, rinse and pat dry prior to dressing wounds Anesthetic (add to Medication List): Wound #4 Right,Lateral Foot: Topical Lidocaine 4% cream applied to wound bed prior to debridement (In Clinic Only). Primary Wound Dressing: Wound #4 Right,Lateral Foot: Hydrafera Blue Ready Transfer - one piece inside wound 2nd piece laid on top Secondary Dressing: Wound #4 Right,Lateral Foot: Foam - 3rd on bony prominences Dressing Change Frequency: Wound #4 Right,Lateral Foot: Change dressing every week Follow-up Appointments: Wound #4 Right,Lateral Foot: Return Appointment in 1 week. Off-Loading: Wound #4 Right,Lateral Foot: Total Contact Cast to Right Lower Extremity MESHILEM, MACHUCA. ([id number]) 1. I would recommend that we going to continue with the wound care measures as before this includes utilizing the [facility] to the base of the wound followed by foam to the kitchen. 2. I am also can recommend at this point the patient needs to have the cast reapplied and I did apply the total contact cast today which he tolerated without complication. 3. I am also going to recommend that the patient continue to be monitored weekly with cast changes if he has any concerns or complications he will contact the office and let me know. We will see patient back for reevaluation in 1 week here in the clinic. If anything worsens or changes patient will contact our office for additional recommendations. Electronic Signature(s) Signed: 08/15/2019 9:35:29 AM By: [name] PA-C Entered By: [name] on 08/15/2019 09:35:28 [name] ([id number]) -------------------------------------------------------------------------------- Total Contact Cast Details Patient Name: Leron Croak,  Roye  J. Date of Service: 08/15/2019 8:15 AM Medical Record Number: [id number] Patient Account Number: [id number] Date of Birth/Sex: 08-17-1965 (54 y.o. M) Treating RN: Cornell Barman Primary Care Provider: [name] Other Clinician: Referring Provider: [name] Treating Provider/Extender: Melburn Hake, Cortavious Nix Weeks in Treatment: 22 Total Contact Cast Applied for Wound Assessment: Wound #4 Right,Lateral Foot Performed By: Physician Emilio Math., PA-C Post Procedure Diagnosis Same as Pre-procedure Notes SIze 3 Electronic Signature(s) Signed: 08/15/2019 5:00:44 PM By: [name] PA-C Signed: 08/15/2019 6:14:49 PM By: Gretta Cool, BSN, RN, CWS, Kim RN, BSN Entered By: Gretta Cool, BSN, RN, CWS, Kim on 08/15/2019 08:47:46 [name] ([id number]) -------------------------------------------------------------------------------- SuperBill Details Patient Name: [name] Date of Service: 08/15/2019 Medical Record Number: [id number] Patient Account Number: [id number] Date of Birth/Sex: January 16, 1965 (54 y.o. 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) Facility Procedures CPT4 Code: 41030131 Description: 43888 - DEB SUBQ TISSUE 20 SQ CM/< Modifier: Quantity: 1 CPT4 Code: Description: ICD-10 Diagnosis Description L97.512 Non-pressure chronic ulcer of other part of right foot with fat layer ex Modifier: posed Quantity: Physician Procedures CPT4 Code: 7579728 Description: 11042 - WC PHYS SUBQ TISS 20 SQ  CM Modifier: Quantity: 1 CPT4 Code: Description: ICD-10 Diagnosis Description 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

## 2019-08-22 ENCOUNTER — Encounter: Payer: Managed Care, Other (non HMO) | Admitting: Physician Assistant

## 2019-08-22 ENCOUNTER — Other Ambulatory Visit: Payer: Self-pay

## 2019-08-22 DIAGNOSIS — E11621 Type 2 diabetes mellitus with foot ulcer: Secondary | ICD-10-CM | POA: Diagnosis not present

## 2019-08-22 NOTE — Progress Notes (Addendum)
DEMONTA, WOMBLES ([id number]) Visit Report for 08/22/2019 Chief Complaint Document Details Patient Name: [name], [name]. Date of Service: 08/22/2019 8:00 AM Medical Record Number: [id number] Patient Account Number: [id number] Date of Birth/Sex: Aug 23, 1965 (54 y.o. M) Treating RN: Cornell Barman Primary Care Provider: [name] Other Clinician: Referring Provider: [name] Treating Provider/Extender: Melburn Hake, [name]: 23 Information Obtained from: Patient Chief Complaint Right lateral foot ulcer Electronic Signature(s) Signed: 08/22/2019 8:33:00 AM By: [name] [name] Entered By: [name] on 08/22/2019 08:33:00 [name] ([id number]) -------------------------------------------------------------------------------- Debridement Details Patient Name: [name] Date of Service: 08/22/2019 8:00 AM Medical Record Number: [id number] Patient Account Number: [id number] Date of Birth/Sex: 04-Feb-1965 (53 y.o. M) Treating RN: Cornell Barman Primary Care Provider: [name] Other Clinician: Referring Provider: [name] Treating Provider/Extender: Melburn Hake, Rajendra Spiller Weeks in [name]: 23 Debridement Performed for Wound #4 Right,Lateral Foot Assessment: Performed By: Physician [name], [name] Callicott E., [name] Debridement Type: Debridement Severity of Tissue Pre Debridement: Fat layer exposed Level of Consciousness (Pre- Awake and Alert procedure): Pre-procedure Verification/Time Out Yes - 09:00 Taken: Start Time: 09:00 Pain Control: Lidocaine Total Area Debrided (L x W): 0.3 (cm) x 0.2 (cm) = 0.06 (cm) Tissue and other material Viable, Non-Viable, Callus, Slough, Subcutaneous, Slough debrided: Level: Skin/Subcutaneous Tissue Debridement Description: Excisional Instrument: Curette Bleeding: Minimum Hemostasis Achieved: Pressure End Time: 09:05 Procedural Pain: 0 Post Procedural Pain: 0 Response to [name]:  Procedure was tolerated well Level of Consciousness (Post- Awake and Alert procedure): Post Debridement Measurements of Total Wound Length: (cm) 0.4 Width: (cm) 0.3 Depth: (cm) 0.2 Volume: (cm) 0.019 Character of Wound/Ulcer Post Debridement: Improved Severity of Tissue Post Debridement: Fat layer exposed Post Procedure Diagnosis Same as Pre-procedure Electronic Signature(s) Signed: 08/22/2019 9:42:42 AM By: [name] [name] Signed: 08/26/2019 7:43:28 PM By: Gretta Cool, BSN, RN, CWS, Kim RN, BSN Entered By: [name] on 08/22/2019 09:42:41 [name] ([id number]) -------------------------------------------------------------------------------- HPI Details Patient Name: [name] Date of Service: 08/22/2019 8:00 AM Medical Record Number: [id number] Patient Account Number: [id number] Date of Birth/Sex: [birth year] (54 y.o. M) Treating RN: Cornell Barman Primary Care Provider: [name] Other Clinician: Referring Provider: [name] Treating Provider/Extender: Melburn Hake, Jurni Cesaro Weeks in [name]: 23 History of Present Illness HPI Description: This 54 year old male comes with an ulcerated area on the plantar aspect of the right foot which she's had for approximately a month. I have known him from a previous visit at [facility] wound center and was treated in the months of April and May 2016 and rapidly healed a left plantar ulcer with a total contact cast. He has been a diabetic for about 16 years and tries to keep active and is fairly compliant with his diabetes management. 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 Past medical history significant for hypertension, hyperlipidemia, and status post appendectomy 1993. He does not smoke or drink alcohol. 01/14/2015 -- the patient had tolerated his total contact cast very well and had no problems and has had no systemic symptoms. However when his total contact cast was cut open he had excessive amount of  purulent drainage in spite of being on antibiotics. He had had a recent x-ray done in the ER 12/22/2014 which showed IMPRESSION:No evidence of osseous erosion. Known soft tissue ulceration is not well characterized on radiograph. Scattered vascular calcifications seen. his last hemoglobin A1c in December was 7.3. He has been on Augmentin and doxycycline for the last 2 weeks. 01/21/2015 -- his culture grew  rare growth of Pantoea species an MR moderate growth of Candida parapsilosis. it is sensitive to levofloxacin. He has not heard back from the insurance company regarding his hyperbaric oxygen therapy. His MRI has not been done yet and we will try and get him an earlier date 01/28/2015 -- MRI was done last night -- IMPRESSION:1. Soft tissue ulcer overlying the plantar aspect of the fifth metatarsal head extending to the cortex. Subcortical marrow edema in the fifth metatarsal head with corresponding T1 hypointensity is concerning for early osteomyelitis of the plantar lateral aspect of the fifth metatarsal head. Chest x-ray done on 01/14/2015 shows bronchiectatic changes without infiltrate. EKG done on generally 17 2017 shows a normal sinus rhythm and is a normal EKG. 02/04/2015 -- he was asked to see Dr. [name] last week and had 2 appointments but had to cancel both due to pressures of work. Last night he has woken up with severe pain in the foot and leg and it is swollen up. No fever or no change in his blood glucose. Addendum: I spoke with Dr. [name] who kindly agreed to accept the patient for inpatient therapy and have also opened to the hospitalist Dr. [name], and discuss details of the management including PICC line and repeat cultures. 02/12/2015-- -- was seen by Dr. [name] in the hospital and a PICC line was placed. He was to receive Ceftazidime 2 g every 12 hourly, oral levofloxacin 750 mg every 24 hourly and oral fluconazole 200 mg daily. The antibiotics were to be given for 4  weeks except the Diflucan was to be given for the first 2 weeks. Reviewed note from 02/10/2015 -- and Dr. [name] had recommended management for growth of MSSA and Serratia. He switched him from ceftazidime to ceftriaxone 2 g every 24 hours. Levofloxacin was stopped and he would continue on fluconazole for another week. He had asked me to decide whether further imaging was necessary and whether surgical debridement of the infected bone was needed. He is doing well and has been off work for this week and we will keep him off the next week. 02/22/2015 -- he was seen by my colleague on 01/19/2015 and at that time an incision and drainage was done on his right lateral forefoot on the dorsum. Today when I probed this wound it is frankly draining pus and it communicates with the ulcer on the plantar aspect of his right foot. The patient is still on IV ceftriaxone 2 g every 24 hours and is to be seen by Dr. [name] on Friday. 03/19/2015 -- On 03/04/2015 I spoke to Dr. 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 He recommended operative debridement which would probably result in the fifth metatarsal head and toe amputation.The patient would be referred back to [place] once he was done with surgery. He was admitted to [facility] yesterday and had surgery done by podiatry for a right fifth metatarsal acute osteomyelitis with cellulitis and abscess. He had a right foot incision and drainage with fifth metatarsal partial amputation and removal of toe infected bone and soft tissue with cultures. The wound was partially closed and packing of the distal end was done. Patient was already on cefepime 2 g IV every 8 hourly and put on vancomycin pending final cultures. He had grown moderate gram-negative rods, and later found  to be rare  diphtheroids. I received a call from Dr. Cannon Kettle the podiatrist and we discussed the above. On 03/10/2015 he was found positive for influenza a and has been put on Tamiflu. Since his discharge he has been seen by Dr. Cannon Kettle who is planning to remove his sutures this coming week. He was reviewed by Dr. [name] on 03/17/2015 and his Diflucan was stopped and vancomycin. After the 3 doses he is taking. He is going to change Ceftazidime to Zosyn 3.375 g IV every 8 hours. 03/25/2015 -- he was seen by the podiatrist a couple of days ago and the sutures were removed. She will follow back with him in 4 weeks' time and at that time x-rays will be taken and a custom molded insert would be made for his shoe. 04/01/2015 -- he was seen by Dr. [name] on 03/29/2015 who pulled the PICC line stopped his IV antibiotics and recommended starting doxycycline and levofloxacin for 2 weeks. He also stop the fluconazole. The patient will follow up with him only when necessary. Readmission: 03/15/17 on evaluation today patient presents for initial evaluation concerning the new issues although he has previously been evaluated in our clinic. Unfortunately the previous evaluation led to the patient having to proceed to amputation and so he is somewhat nervous about being here [name], [name] ([id number]) today. With that being said he has a very slight blister that occurred on the plantar aspect more medial on the right great toe that has been present for just a very short amount of time, several days. With that being said he felt like initially when he called that he somewhat overreacted but due to the fact that his previous issue led to amputation he is very cautious these days I explained that he did the right thing. With that being said he has been tolerating the dressing changes without complication mainly he just been covering this. He does not have any discomfort in secondary to neuropathy it's unlikely that he  feels much. With that being said he does tell me that the issue that he had here is that he went barefoot when he knows he should not have which subsequently led to the blisters. He states is definitely not doing that anymore. His most recent hemoglobin A1c was December and registered 6.9 his ABI today was 1.1. 03/27/17 on evaluation today patient appears to be doing great in regard to his right great toe ulcer. He has been tolerating the dressing changes without complication. The good news is this is making excellent progress and he seems to be caring for this in an excellent fashion. I see no evidence of breakdown that would make me concerned that he was at risk for infection/amputation. This has obviously been his concern due to the fifth toe amputation that was necessitated previous when he had a similar issue. Nonetheless this seems to be progressing much more nicely. Readmission: 03/13/2019 upon evaluation today patient presents for reevaluation here in the clinic concerning issues he has been having with his right foot on the lateral portion at the proximal end of the metatarsal. Subsequently he tells me that this just opened up in the past couple of days or so and the erythema really began about 24 to 48 hours ago. Fortunately there is no signs of systemic infection. 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 That left him with this bony prominence that is where the wound is currently. There is erythema  surrounding that does have me concerned about cellulitis. With that being said he was noncompressible as far as ABIs are concerned I do think we can need to check into arterial studies at this point. Patient does have a history of hypertension along with diabetes mellitus type 2. He also tends to develop quite a bit of callus white often. 03/20/2019 upon evaluation today patient appears to be doing very well in regard to his wounds currently. He has  been tolerating the dressing changes without complication. Fortunately there is no signs of infection. His ABIs were good at this point and registered at 1.07 on the left and 1.09 on the right. In regard to the x-ray this was negative as well for any signs of osteomyelitis. The patient also seems to be doing better in regard to the infection. He still has several days of the antibiotic left at this point best the Bactrim DS and he seems to be doing very well with this. Overall I am extremely pleased with the progress in 1 week's time he is going require some sharp debridement today however. 03/27/2019 upon evaluation today patient actually appears to be making some progress in regard to his wound. It is a little deeper but measuring smaller which is good news due to the fact that again were clear away some of the necrotic tissue which is why the depth is increasing a little bit. Nonetheless after like were getting very close to being down at a good wound bed. Fortunately there is no signs of active infection at this time. There is a little bit of erythema immediately surrounding the wound that we do want to be very cognizant of and careful about. For that reason I am going to go ahead and extend his antibiotic today for an additional 10 days that is the Bactrim. 04/03/2019 upon evaluation today patient appears to be making some progress. I do feel like the Annitta Needs is doing a good job along with the alginate is being packed into the wound space behind. He has been tolerating the dressing changes without complication. Fortunately there is no signs of active infection at this time. No fevers, chills, nausea, vomiting, or diarrhea. 04/10/2019 upon evaluation today patient appears to be making progress. The wound is not quite as deep but he still does not have a great wound surface yet for working on this and the Santyl does seem to be cleaning things up. Fortunately there is no evidence of active infection at  this time. No fevers, chills, nausea, vomiting, or diarrhea. 04/17/2019 upon evaluation today patient appears to be doing excellent at this time in regard to his foot ulcer. I do believe the Santyl with the alginate packed in behind has been of benefit for him and I am very pleased in this regard. With that being said I am feeling like the wound surface is improving quite a bit each time I see him still I do not believe we are at the point of stating that the wound bed is perfect but again were making progress. The patient is going require some sharp debridement today. 4/22; this is a patient who is using Santyl with calcium alginate. Apparently his wound dimensions have been improving. Patient is changing the dressing himself. He has a surgical shoe. He works in a sitting position therefore is not on his feet all that much. There is undermining 05/01/2019 upon evaluation today patient appears to be doing a little better in regard to the size of his wound  though he still has some depth. This does seem to be much cleaner has been using Santyl on the base of the wound followed by packing with silver alginate and behind. Nonetheless I do believe that based on what we are seeing we may need to switch to a collagen type dressing endoform may actually be excellent for him. 05/08/2019 upon evaluation today patient's wound actually appears to be showing better granulation tissue in the base of the wound. With that being said he does have some epiboly noted around the edges of the wound especially plantar and more distal. Subsequently this is can require sharp debridement today. Fortunately there is no signs of active systemic infection though I do feel like there is some local cellulitis noted at this point. The patient also notes has had increased drainage. 5/13; wound volume quite a bit improved this week down to 3 mm. Patient states pain and drainage better. Culture from last week grew staph aureus [not MRSA]  and this is sensitive to quinolones and he is on Levaquin. HOWEVER he also grew abundant Enterococcus faecalis [name] of choice for this is ampicillin. Quinolone coverage is unreliable 5/20 completing the Augmentin. Small wound on the right lateral foot. Thick rolled edges around the wound. Using silver alginate 05/29/2019 upon evaluation today patient appears to be doing better compared to last time I saw him. Fortunately there is no signs of active infection and I do feel like he is actually making good progress. Overall the quality of the wound bed along with the size looks improved and the inflammation/erythema that was previously noted by myself when I saw the couple weeks back actually has also resolved and this is great. Overall very pleased with how things stand. 06/06/2019 upon evaluation today patient actually appears to be doing quite well with regard to his foot ulcer with regard to some of the granulation were seen at this point. There does not appear to be any evidence of systemic or local infection although he still has discomfort I feel like things are moving in a slow but surely correct direction. I think we may need to help fill some of the space however even though he is using the collagen we may need to have something behind this such as a plain packing strip to try to help hold the collagen in the base of the wound. 06/12/2019 upon evaluation today patient appears to be doing well with regard to his foot ulcer. This seems to be making some slow but steady progress. The wound does not appear to be as deep today compared to where it was previous. Fortunately there is no signs of active infection at this time. No fevers, chills, nausea, vomiting, or diarrhea. 06/19/2019 on evaluation today patient's wound does not appear to be doing quite as well as what I would like to see. Fortunately there is no signs of systemic infection at this time which is great news. With that being said I do  believe that the patient needs to have a wound culture as I do SECUNDINO, ELLITHORPE. ([id number]) believe he likely has a local infection that needs to be addressed. He is also can require some debridement today as well. 06/26/2019 upon evaluation today patient's wound actually appears to be showing signs of excellent improvement today. There does not appear to be any evidence of active infection which is great news. There does not appear to be significant erythema at this point. The patient does have improvement overall in the appearance of  the infection he has been taking the Augmentin the culture came back showing no organism was grown at this point but nonetheless I do believe he has been doing well with the antibiotics I would continue that at this point. 07/03/2019 upon evaluation today patient appears to be doing about the same in regard to his wound. There does not appear to be any evidence of active infection and overall I am pleased with where things stand. The patient is tolerating the dressing changes without complication but it does not appear that they are packing the wound at this time which I think is going to slow down his healing prospects. 07/10/2019 upon evaluation today patient appears to be doing little better in my opinion in regard to the overall moisture collected in the base of the wound. They have been packing this with a little bit of silver alginate and that has done quite well. Overall I am pleased in that regard. Subsequently the redness does not appear to be any worse today I did actually go ahead and marked this since this can be 2 weeks between now and when I see him again I would make sure that if anything changes or worsens he will be able to notice this and go ahead and initiate [name] with the Augmentin. 07/22/2019 patient comes in today a little bit early for evaluation secondary to the fact that he was having issues with increased drainage and concern about  infection. With that being said he does appear to have developed a blister area again proximal to where the original wound opening was in although the wound bed itself actually appears to be doing somewhat better he has had increased drainage and tracking of fluid here due to this blister. Nonetheless I believe that that does need to be cleaned away today but again I am not really certain that I see a lot of evidence for infection at this point. 07/29/2019 on evaluation today patient actually appears to be doing better in regard to his foot ulcer. There is improvement compared to last week which is great news. Nonetheless he does come prepared to apply the total contact cast today which I think would be beneficial for him. He knows he is not supposed to drive he did bring his son with him today in order to drive him. 07/31/2019 upon evaluation today patient appears to be doing well with regard to his wound today. I do not see any signs of infection I do believe that he is definitely showing some signs of improvement here in the office in a couple days as far as the overall erythema and pressure getting to the wound area is concerned. With that being said I do believe that the cast has been of great benefit for him. 08/08/2019 upon evaluation today patient appears to be doing better in regard to his wound. Overall I feel like he is showing signs of improvement. The external measurement is really not bad at all although I did remove some of the callus in order to open up the wound itself the depth is actually coming quite well and I am extremely pleased in that regard I do not think we can to see much improvement externally until we get the internal depth filled in and that again is improved. Fortunately there is no signs of active infection at this time. No fevers, chills, nausea, vomiting, or diarrhea. 08/15/2019 on evaluation today patient actually appears to be doing well with regard to his foot ulcer.  This is showing some signs of minimal improvement from the standpoint of the depth and appearance of the wound as well as the edges of the wound. It is taking its time and to be honest I do believe that he seems to be progressing well. I am happy with the cast and what it is achieving at this point. Have contemplated checking into Dermagraft for Apligraf for the patient but again he does seem to be making fairly good progress in my opinion. If it has not tremendously changed come next week I may consider one of the 2. 08/22/2019 on evaluation today patient appears to be making good progress with regard to his wound I am still very pleased with where things stand today. Overall I think that he is making good improvements week by week and I believe that we are getting continue with the Total contact cast I will likely switch to a silver alginate dressing. Electronic Signature(s) Signed: 08/22/2019 10:52:47 AM By: [name] [name] Entered By: [name] on 08/22/2019 10:52:47 [name] ([id number]) -------------------------------------------------------------------------------- Physical Exam Details Patient Name: [name], [name] Date of Service: 08/22/2019 8:00 AM Medical Record Number: [id number] Patient Account Number: [id number] Date of Birth/Sex: 20-Apr-1965 (54 y.o. M) Treating RN: Cornell Barman Primary Care Provider: [name] Other Clinician: Referring Provider: [name] Treating Provider/Extender: [name], [name] Weeks in [name]: 78 Constitutional Well-nourished and well-hydrated in no acute distress. Respiratory normal breathing without difficulty. Psychiatric this patient is able to make decisions and demonstrates good insight into disease process. Alert and Oriented x 3. pleasant and cooperative. Notes Upon inspection patient's wound bed actually showed signs of good granulation there was some callus buildup around the edges of the wound and a  minimal amount of slough that I did perform sharp debridement on today he tolerated that without complication post debridement wound bed appears to be doing much better and very pleased with where things stand. I did reapply the total contact cast as well at this point. Electronic Signature(s) Signed: 08/22/2019 10:53:10 AM By: [name] [name] Entered By: [name] on 08/22/2019 10:53:09 [name] ([id number]) -------------------------------------------------------------------------------- Physician Orders Details Patient Name: [name] Date of Service: 08/22/2019 8:00 AM Medical Record Number: [id number] Patient Account Number: [id number] Date of Birth/Sex: 1965/04/29 (54 y.o. 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 - in office o Dial antibacterial soap, wash wounds, rinse and pat dry prior to dressing wounds Anesthetic (add to Medication List) Wound #4 Right,Lateral Foot o Topical Lidocaine 4% cream applied to wound bed prior to debridement (In Clinic Only). Primary Wound Dressing Wound #4 Right,Lateral Foot o Silver Alginate Secondary Dressing Wound #4 Right,Lateral Foot o Foam - 3rd on bony prominences Dressing Change Frequency Wound #4 Right,Lateral Foot o Change dressing every week Follow-up Appointments Wound #4 Right,Lateral Foot o Return  Appointment in 1 week. Off-Loading Wound #4 Right,Lateral Foot o Total Contact Cast to Right Lower Extremity Electronic Signature(s) Signed: 08/22/2019 5:05:31 PM By: Grover Canavan Signed:  08/22/2019 5:28:43 PM By: [name] [name] Entered By: Grover Canavan on 08/22/2019 08:39:38 [name] ([id number]) -------------------------------------------------------------------------------- Problem List Details Patient Name: FREDDIE, DYMEK. Date of Service: 08/22/2019 8:00 AM Medical Record Number: [id number] Patient Account Number: [id number] Date of Birth/Sex: [birth year] (54 y.o. 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 M) Treating RN: Cornell Barman Primary Care Provider: [name] Other Clinician: Referring Provider: [name] Treating Provider/Extender: Melburn Hake,  Kiosha Buchan Weeks in [name]: 23 Subjective Chief Complaint Information obtained from Patient Right lateral foot ulcer History of Present Illness (HPI) This 54 year old male comes with an ulcerated area on the plantar aspect of the right foot which she's had for approximately a month. I have known him from a previous visit at [facility] wound center and was treated in the months of April and May 2016 and rapidly healed a left plantar ulcer with a total contact cast. He has been a diabetic for about 16 years and tries to keep active and is fairly compliant with his diabetes management. 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 Past medical history significant for hypertension, hyperlipidemia, and status post appendectomy 1993. He does not smoke or drink alcohol. 01/14/2015 -- the patient had tolerated his total contact cast very well and had no problems and has had no systemic symptoms. However when his total contact cast was cut open he had excessive amount of purulent drainage in spite of being on antibiotics. He had had a recent x-ray done in the ER 12/22/2014 which showed IMPRESSION:No evidence of osseous erosion. Known soft tissue ulceration is not well characterized on radiograph. Scattered vascular calcifications seen. his last hemoglobin A1c in December was 7.3. He has been on Augmentin and doxycycline for the last 2 weeks. 01/21/2015 -- his culture grew rare growth of Pantoea species an MR moderate growth of Candida parapsilosis. it is sensitive to levofloxacin. He has not heard back from the insurance company regarding his hyperbaric oxygen therapy. His MRI has not been done yet and we will try and get him an earlier date 01/28/2015 -- MRI was done last night -- IMPRESSION:1. Soft tissue ulcer overlying the plantar aspect of the fifth metatarsal head extending to the cortex. Subcortical marrow edema in the fifth metatarsal head with corresponding T1 hypointensity is concerning for early  osteomyelitis of the plantar lateral aspect of the fifth metatarsal head. Chest x-ray done on 01/14/2015 shows bronchiectatic changes without infiltrate. EKG done on generally 17 2017 shows a normal sinus rhythm and is a normal EKG. 02/04/2015 -- he was asked to see Dr. [name] last week and had 2 appointments but had to cancel both due to pressures of  work. Last night he has woken up with severe pain in the foot and leg and it is swollen up. No fever or no change in his blood glucose. Addendum: I spoke with Dr. [name] who kindly agreed to accept the patient for inpatient therapy and have also opened to the hospitalist Dr. [name], and discuss details of the management including PICC line and repeat cultures. 02/12/2015-- -- was seen by Dr. [name] in the hospital and a PICC line was placed. He was to receive Ceftazidime 2 g every 12 hourly, oral levofloxacin 750 mg every 24 hourly and oral fluconazole 200 mg daily. The antibiotics were to be given for 4 weeks except the Diflucan was to be given for the first 2 weeks. Reviewed note from 02/10/2015 -- and Dr. [name] had recommended management for growth of MSSA and Serratia. He switched him from ceftazidime to ceftriaxone 2 g every 24 hours. Levofloxacin was stopped and he would continue on fluconazole for another week. He had asked me to decide whether further imaging was necessary and whether surgical debridement of the infected bone was needed. He is doing well and has been off work for this week and we will keep him off the next week. 02/22/2015 -- he was seen by my colleague on 01/19/2015 and at that time an incision and drainage was done on his right lateral forefoot on the dorsum. Today when I probed this wound it is frankly draining pus and it communicates with the ulcer on the plantar aspect of his right foot. The patient is still on IV ceftriaxone 2 g every 24 hours and is to be seen by Dr. [name] on Friday. 03/19/2015 -- On  03/04/2015 I spoke to Dr. 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 He recommended operative debridement which would probably result in the fifth metatarsal head and toe amputation.The patient would be referred back to [place] once he was done with surgery. He was admitted to [facility] yesterday and had surgery done by podiatry for a right fifth metatarsal acute osteomyelitis with cellulitis and abscess. He had a right foot incision and drainage with fifth metatarsal partial amputation and removal of toe infected bone and soft tissue with cultures. The wound was partially closed and packing of the distal end was done. Patient was already on cefepime 2 g IV every 8 hourly and put on vancomycin pending final cultures. He had grown moderate gram-negative rods, and later found to be rare diphtheroids. I received a call from Dr. Cannon Kettle the podiatrist and we discussed the above. On 03/10/2015 he was found positive for influenza a and has been put on Tamiflu. Since his discharge he has been seen by Dr. Cannon Kettle who is planning to remove his sutures this coming week. He was reviewed by Dr. [name] on 03/17/2015 and his Diflucan was stopped and vancomycin. After the 3 doses he is taking. He is going to change Ceftazidime to Zosyn 3.375 g IV every 8 hours. 03/25/2015 -- he was seen by the podiatrist a couple of days ago and the sutures were removed. She will follow back with him in 4 weeks' time and at that time x-rays will be taken and a custom molded insert would be made for his shoe. 04/01/2015 -- he was seen by Dr. [name] on 03/29/2015 who pulled the PICC  line stopped his IV antibiotics and recommended starting doxycycline and levofloxacin for 2 weeks. He also stop the fluconazole. The patient will follow up with him only when  necessary. [name], [name] ([id number]) Readmission: 03/15/17 on evaluation today patient presents for initial evaluation concerning the new issues although he has previously been evaluated in our clinic. Unfortunately the previous evaluation led to the patient having to proceed to amputation and so he is somewhat nervous about being here today. With that being said he has a very slight blister that occurred on the plantar aspect more medial on the right great toe that has been present for just a very short amount of time, several days. With that being said he felt like initially when he called that he somewhat overreacted but due to the fact that his previous issue led to amputation he is very cautious these days I explained that he did the right thing. With that being said he has been tolerating the dressing changes without complication mainly he just been covering this. He does not have any discomfort in secondary to neuropathy it's unlikely that he feels much. With that being said he does tell me that the issue that he had here is that he went barefoot when he knows he should not have which subsequently led to the blisters. He states is definitely not doing that anymore. His most recent hemoglobin A1c was December and registered 6.9 his ABI today was 1.1. 03/27/17 on evaluation today patient appears to be doing great in regard to his right great toe ulcer. He has been tolerating the dressing changes without complication. The good news is this is making excellent progress and he seems to be caring for this in an excellent fashion. I see no evidence of breakdown that would make me concerned that he was at risk for infection/amputation. This has obviously been his concern due to the fifth toe amputation that was necessitated previous when he had a similar issue. Nonetheless this seems to be progressing much more nicely. Readmission: 03/13/2019 upon evaluation today patient presents for  reevaluation here in the clinic concerning issues he has been having with his right foot on the lateral portion at the proximal end of the metatarsal. Subsequently he tells me that this just opened up in the past couple of days or so and the erythema really began about 24 to 48 hours ago. Fortunately there is no signs of systemic infection. 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 That left him with this bony prominence that is where the wound is currently. There is erythema surrounding that does have me concerned about cellulitis. With that being said he was noncompressible as far as ABIs are concerned I do think we can need to check into arterial studies at this point. Patient does have a history of hypertension along with diabetes mellitus type 2. He also tends to develop quite a bit of callus white often. 03/20/2019 upon evaluation today patient appears to be doing very well in regard to his wounds currently. He has been tolerating the dressing changes without complication. Fortunately there is no signs of infection. His ABIs were good at this point and registered at 1.07 on the left and 1.09 on the right. In regard to the x-ray this was negative as well for any signs of osteomyelitis. The patient also seems to be doing better in regard to the infection. He still has several days of the antibiotic left at  this point best the Bactrim DS and he seems to be doing very well with this. Overall I am extremely pleased with the progress in 1 week's time he is going require some sharp debridement today however. 03/27/2019 upon evaluation today patient actually appears to be making some progress in regard to his wound. It is a little deeper but measuring smaller which is good news due to the fact that again were clear away some of the necrotic tissue which is why the depth is increasing a little bit. Nonetheless after like were getting very close to being down  at a good wound bed. Fortunately there is no signs of active infection at this time. There is a little bit of erythema immediately surrounding the wound that we do want to be very cognizant of and careful about. For that reason I am going to go ahead and extend his antibiotic today for an additional 10 days that is the Bactrim. 04/03/2019 upon evaluation today patient appears to be making some progress. I do feel like the Annitta Needs is doing a good job along with the alginate is being packed into the wound space behind. He has been tolerating the dressing changes without complication. Fortunately there is no signs of active infection at this time. No fevers, chills, nausea, vomiting, or diarrhea. 04/10/2019 upon evaluation today patient appears to be making progress. The wound is not quite as deep but he still does not have a great wound surface yet for working on this and the Santyl does seem to be cleaning things up. Fortunately there is no evidence of active infection at this time. No fevers, chills, nausea, vomiting, or diarrhea. 04/17/2019 upon evaluation today patient appears to be doing excellent at this time in regard to his foot ulcer. I do believe the Santyl with the alginate packed in behind has been of benefit for him and I am very pleased in this regard. With that being said I am feeling like the wound surface is improving quite a bit each time I see him still I do not believe we are at the point of stating that the wound bed is perfect but again were making progress. The patient is going require some sharp debridement today. 4/22; this is a patient who is using Santyl with calcium alginate. Apparently his wound dimensions have been improving. Patient is changing the dressing himself. He has a surgical shoe. He works in a sitting position therefore is not on his feet all that much. There is undermining 05/01/2019 upon evaluation today patient appears to be doing a little better in regard to the  size of his wound though he still has some depth. This does seem to be much cleaner has been using Santyl on the base of the wound followed by packing with silver alginate and behind. Nonetheless I do believe that based on what we are seeing we may need to switch to a collagen type dressing endoform may actually be excellent for him. 05/08/2019 upon evaluation today patient's wound actually appears to be showing better granulation tissue in the base of the wound. With that being said he does have some epiboly noted around the edges of the wound especially plantar and more distal. Subsequently this is can require sharp debridement today. Fortunately there is no signs of active systemic infection though I do feel like there is some local cellulitis noted at this point. The patient also notes has had increased drainage. 5/13; wound volume quite a bit improved this week down to  3 mm. Patient states pain and drainage better. Culture from last week grew staph aureus [not MRSA] and this is sensitive to quinolones and he is on Levaquin. HOWEVER he also grew abundant Enterococcus faecalis [name] of choice for this is ampicillin. Quinolone coverage is unreliable 5/20 completing the Augmentin. Small wound on the right lateral foot. Thick rolled edges around the wound. Using silver alginate 05/29/2019 upon evaluation today patient appears to be doing better compared to last time I saw him. Fortunately there is no signs of active infection and I do feel like he is actually making good progress. Overall the quality of the wound bed along with the size looks improved and the inflammation/erythema that was previously noted by myself when I saw the couple weeks back actually has also resolved and this is great. Overall very pleased with how things stand. 06/06/2019 upon evaluation today patient actually appears to be doing quite well with regard to his foot ulcer with regard to some of the granulation were seen at this  point. There does not appear to be any evidence of systemic or local infection although he still has discomfort I feel like things are moving in a slow but surely correct direction. I think we may need to help fill some of the space however even though he is using the collagen we may need to have something behind this such as a plain packing strip to try to help hold the collagen in the base of the wound. 06/12/2019 upon evaluation today patient appears to be doing well with regard to his foot ulcer. This seems to be making some slow but steady progress. The wound does not appear to be as deep today compared to where it was previous. Fortunately there is no signs of active infection at [name], [name]. ([id number]) this time. No fevers, chills, nausea, vomiting, or diarrhea. 06/19/2019 on evaluation today patient's wound does not appear to be doing quite as well as what I would like to see. Fortunately there is no signs of systemic infection at this time which is great news. With that being said I do believe that the patient needs to have a wound culture as I do believe he likely has a local infection that needs to be addressed. He is also can require some debridement today as well. 06/26/2019 upon evaluation today patient's wound actually appears to be showing signs of excellent improvement today. There does not appear to be any evidence of active infection which is great news. There does not appear to be significant erythema at this point. The patient does have improvement overall in the appearance of the infection he has been taking the Augmentin the culture came back showing no organism was grown at this point but nonetheless I do believe he has been doing well with the antibiotics I would continue that at this point. 07/03/2019 upon evaluation today patient appears to be doing about the same in regard to his wound. There does not appear to be any evidence of active infection and overall I am  pleased with where things stand. The patient is tolerating the dressing changes without complication but it does not appear that they are packing the wound at this time which I think is going to slow down his healing prospects. 07/10/2019 upon evaluation today patient appears to be doing little better in my opinion in regard to the overall moisture collected in the base of the wound. They have been packing this with a little bit of silver alginate  and that has done quite well. Overall I am pleased in that regard. Subsequently the redness does not appear to be any worse today I did actually go ahead and marked this since this can be 2 weeks between now and when I see him again I would make sure that if anything changes or worsens he will be able to notice this and go ahead and initiate [name] with the Augmentin. 07/22/2019 patient comes in today a little bit early for evaluation secondary to the fact that he was having issues with increased drainage and concern about infection. With that being said he does appear to have developed a blister area again proximal to where the original wound opening was in although the wound bed itself actually appears to be doing somewhat better he has had increased drainage and tracking of fluid here due to this blister. Nonetheless I believe that that does need to be cleaned away today but again I am not really certain that I see a lot of evidence for infection at this point. 07/29/2019 on evaluation today patient actually appears to be doing better in regard to his foot ulcer. There is improvement compared to last week which is great news. Nonetheless he does come prepared to apply the total contact cast today which I think would be beneficial for him. He knows he is not supposed to drive he did bring his son with him today in order to drive him. 07/31/2019 upon evaluation today patient appears to be doing well with regard to his wound today. I do not see any signs of  infection I do believe that he is definitely showing some signs of improvement here in the office in a couple days as far as the overall erythema and pressure getting to the wound area is concerned. With that being said I do believe that the cast has been of great benefit for him. 08/08/2019 upon evaluation today patient appears to be doing better in regard to his wound. Overall I feel like he is showing signs of improvement. The external measurement is really not bad at all although I did remove some of the callus in order to open up the wound itself the depth is actually coming quite well and I am extremely pleased in that regard I do not think we can to see much improvement externally until we get the internal depth filled in and that again is improved. Fortunately there is no signs of active infection at this time. No fevers, chills, nausea, vomiting, or diarrhea. 08/15/2019 on evaluation today patient actually appears to be doing well with regard to his foot ulcer. This is showing some signs of minimal improvement from the standpoint of the depth and appearance of the wound as well as the edges of the wound. It is taking its time and to be honest I do believe that he seems to be progressing well. I am happy with the cast and what it is achieving at this point. Have contemplated checking into Dermagraft for Apligraf for the patient but again he does seem to be making fairly good progress in my opinion. If it has not tremendously changed come next week I may consider one of the 2. 08/22/2019 on evaluation today patient appears to be making good progress with regard to his wound I am still very pleased with where things stand today. Overall I think that he is making good improvements week by week and I believe that we are getting continue with the Total contact cast  I will likely switch to a silver alginate dressing. Objective Constitutional Well-nourished and well-hydrated in no acute  distress. Vitals Time Taken: 8:11 AM, Height: 74 in, Weight: 308 lbs, BMI: 39.5, Temperature: 98.6 F, Pulse: 88 bpm, Respiratory Rate: 20 breaths/min, Blood Pressure: 165/76 mmHg. Respiratory normal breathing without difficulty. Psychiatric this patient is able to make decisions and demonstrates good insight into disease process. Alert and Oriented x 3. pleasant and cooperative. General Notes: Upon inspection patient's wound bed actually showed signs of good granulation there was some callus buildup around the edges of the wound and a minimal amount of slough that I did perform sharp debridement on today he tolerated that without complication post debridement wound bed appears to be doing much better and very pleased with where things stand. I did reapply the total contact cast as well at EDDRICK, [name]. ([id number]) this point. Integumentary (Hair, Skin) Wound #4 status is Open. Original cause of wound was Gradually Appeared. The wound is located on the Right,Lateral Foot. The wound measures 0.3cm length x 0.2cm width x 0.2cm depth; 0.047cm^2 area and 0.009cm^3 volume. There is Fat Layer (Subcutaneous Tissue) Exposed exposed. There is no tunneling or undermining noted. There is a medium amount of serous drainage noted. The wound margin is flat and intact. There is medium (34-66%) pink granulation within the wound bed. There is a medium (34-66%) amount of necrotic tissue within the wound bed including Adherent Slough. Assessment Active Problems ICD-10 Type 2 diabetes mellitus with foot ulcer 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d Cellulitis of right lower limb Corns and callosities Essential (primary) hypertension Acquired absence of other left toe(s) Procedures Wound #4 Pre-procedure diagnosis of Wound #4 is a Diabetic Wound/Ulcer of the Lower Extremity located on the Right,Lateral Foot .Severity of Tissue Pre Debridement is: Fat layer  exposed. There was a Excisional Skin/Subcutaneous Tissue Debridement with a total area of 0.06 sq cm performed by [name], [name] Charters E., [name]. With the following instrument(s): Curette to remove Viable and Non-Viable tissue/material. Material removed includes Callus, Subcutaneous Tissue, and Slough after achieving pain control using Lidocaine. No specimens were taken. A time out was conducted at 09:00, prior to the start of the procedure. A Minimum amount of bleeding was controlled with Pressure. The procedure was tolerated well with a pain level of 0 throughout and a pain level of 0 following the procedure. Post Debridement Measurements: 0.4cm length x 0.3cm width x 0.2cm depth; 0.019cm^3 volume. Character of Wound/Ulcer Post Debridement is improved. Severity of Tissue Post Debridement is: Fat layer exposed. Post procedure Diagnosis Wound #4: Same as Pre-Procedure Plan Wound Cleansing: Wound #4 Right,Lateral Foot: Clean wound with Normal Saline. - in office Dial antibacterial soap, wash wounds, rinse and pat dry prior to dressing wounds Anesthetic (add to Medication List): Wound #4 Right,Lateral Foot: Topical Lidocaine 4% cream applied to wound bed prior to debridement (In Clinic Only). Primary Wound Dressing: Wound #4 Right,Lateral Foot: Silver Alginate Secondary Dressing: Wound #4 Right,Lateral Foot: Foam - 3rd on bony prominences Dressing Change Frequency: Wound #4 Right,Lateral Foot: Change dressing every week Follow-up Appointments: Wound #4 Right,Lateral Foot: Return Appointment in 1 week. Off-Loading: Wound #4 Right,Lateral Foot: Total Contact Cast to Right Lower Extremity [name], [name]. ([id number]) 1. I would recommend currently we continue with the total contact cast I think that still the appropriate way to go. 2. I am also can recommend that we have the patient continue with dressings over the area were obviously  were put not on here in the clinic we can use silver  alginate which I think may be better than the [facility] is somewhat of seeing currently. 3. I am also can recommend that he can continue to perform activities as he has been doing with the cast on that seems to be doing fine for him I see no evidence of infection. We will see patient back for reevaluation in 1 week here in the clinic. If anything worsens or changes patient will contact our office for additional recommendations. Electronic Signature(s) Signed: 08/22/2019 10:53:48 AM By: [name] [name] Entered By: [name] on 08/22/2019 10:53:47 [name] ([id number]) -------------------------------------------------------------------------------- SuperBill Details Patient Name: [name] Date of Service: 08/22/2019 Medical Record Number: [id number] Patient Account Number: [id number] Date of Birth/Sex: 02/02/1965 (54 y.o. 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) Facility Procedures CPT4 Code: 38381840 Description: 37543 - DEB SUBQ TISSUE 20 SQ CM/< Modifier: Quantity: 1 CPT4 Code: Description: ICD-10 Diagnosis Description L97.512 Non-pressure chronic ulcer of other part of right foot with fat layer ex Modifier: posed Quantity: Physician Procedures CPT4 Code: 6067703 Description: 11042 - WC PHYS SUBQ TISS 20 SQ CM Modifier: Quantity: 1 CPT4 Code: Description: ICD-10 Diagnosis Description L97.512 Non-pressure chronic ulcer of other part of right foot with fat layer  ex Modifier: posed Quantity: Electronic Signature(s) Signed: 08/22/2019 10:54:20 AM By: [name] [name] Entered By: [name] on 08/22/2019 10:54:19

## 2019-08-22 NOTE — Progress Notes (Signed)
[name], [name] ([id number]) Visit Report for 08/22/2019 Arrival Information Details Patient Name: [name], [name]. Date of Service: 08/22/2019 8:00 AM Medical Record Number: [id number] Patient Account Number: [id number] Date of Birth/Sex: Jan 27, 1965 (54 y.o. M) Treating RN: Grover Canavan Primary Care Reise Gladney: [name] Other Clinician: Referring Yannet Rincon: [name] Treating Honestie Kulik/Extender: Melburn Hake, HOYT Weeks in Treatment: 23 Visit Information History Since Last Visit Added or deleted any medications: No Patient Arrived: Ambulatory Had a fall or experienced change in No Arrival Time: 08:10 activities of daily living that may affect Accompanied By: self risk of falls: Transfer Assistance: None Hospitalized since last visit: No Patient Identification Verified: Yes Pain Present Now: No Patient Requires Transmission-Based Precautions: No Patient Has Alerts: Yes Patient Alerts: DMII Electronic Signature(s) Signed: 08/22/2019 5:05:31 PM By: Grover Canavan Entered By: Grover Canavan on 08/22/2019 08:11:17 [name] ([id number]) -------------------------------------------------------------------------------- Clinic Level of Care Assessment Details Patient Name: [name] Date of Service: 08/22/2019 8:00 AM Medical Record Number: [id number] Patient Account Number: [id number] Date of Birth/Sex: 11-01-1965 (54 y.o. M) Treating RN: Grover Canavan Primary Care Justyce Baby: [name] Other Clinician: Referring Saylee Sherrill: [name] Treating Lestat Golob/Extender: Melburn Hake, HOYT Weeks in Treatment: 23 Clinic Level of Care Assessment Items TOOL 1 Quantity Score []  - Use when EandM and Procedure is performed on INITIAL visit 0 ASSESSMENTS - Nursing Assessment / Reassessment []  - General Physical Exam (combine w/ comprehensive assessment (listed just below) when performed on new 0 pt. evals) []  - 0 Comprehensive Assessment  (HX, ROS, Risk Assessments, Wounds Hx, etc.) ASSESSMENTS - Wound and Skin Assessment / Reassessment []  - Dermatologic / Skin Assessment (not related to wound area) 0 ASSESSMENTS - Ostomy and/or Continence Assessment and Care []  - Incontinence Assessment and Management 0 []  - 0 Ostomy Care Assessment and Management (repouching, etc.) PROCESS - Coordination of Care []  - Simple Patient / Family Education for ongoing care 0 []  - 0 Complex (extensive) Patient / Family Education for ongoing care []  - 0 Staff obtains Programmer, systems, Records, Test Results / Process Orders []  - 0 Staff telephones HHA, Nursing Homes / Clarify orders / etc []  - 0 Routine Transfer to another Facility (non-emergent condition) []  - 0 Routine Hospital Admission (non-emergent condition) []  - 0 New Admissions / Biomedical engineer / Ordering NPWT, Apligraf, etc. []  - 0 Emergency Hospital Admission (emergent condition) PROCESS - Special Needs []  - Pediatric / Minor Patient Management 0 []  - 0 Isolation Patient Management []  - 0 Hearing / Language / Visual special needs []  - 0 Assessment of Community assistance (transportation, D/C planning, etc.) []  - 0 Additional assistance / Altered mentation []  - 0 Support Surface(s) Assessment (bed, cushion, seat, etc.) INTERVENTIONS - Miscellaneous []  - External ear exam 0 []  - 0 Patient Transfer (multiple staff / Civil Service fast streamer / Similar devices) []  - 0 Simple Staple / Suture removal (25 or less) []  - 0 Complex Staple / Suture removal (26 or more) []  - 0 Hypo/Hyperglycemic Management (do not check if billed separately) []  - 0 Ankle / Brachial Index (ABI) - do not check if billed separately Has the patient been seen at the hospital within the last three years: Yes Total Score: 0 Level Of Care: ____ [name] ([id number]) Electronic Signature(s) Signed: 08/22/2019 5:05:31 PM By: Grover Canavan Entered By: Grover Canavan on 08/22/2019  08:39:46 [name] ([id number]) -------------------------------------------------------------------------------- Encounter Discharge Information Details Patient Name: [name] Date of Service: 08/22/2019 8:00 AM Medical Record Number: [id number] Patient Account Number: [id number]  Date of Birth/Sex: June 20, 1965 (54 y.o. M) Treating RN: Grover Canavan Primary Care Gabriel Paulding: [name] Other Clinician: Referring Xzavien Harada: [name] Treating Milind Raether/Extender: Melburn Hake, HOYT Weeks in Treatment: 23 Encounter Discharge Information Items Discharge Condition: Stable Ambulatory Status: Ambulatory Discharge Destination: Home Transportation: Private Auto Accompanied By: self Schedule Follow-up Appointment: Yes Clinical Summary of Care: Electronic Signature(s) Signed: 08/22/2019 5:05:31 PM By: Grover Canavan Entered By: Grover Canavan on 08/22/2019 08:41:53 [name] ([id number]) -------------------------------------------------------------------------------- Lower Extremity Assessment Details Patient Name: [name] Date of Service: 08/22/2019 8:00 AM Medical Record Number: [id number] Patient Account Number: [id number] Date of Birth/Sex: [birth year] (54 y.o. M) Treating RN: Grover Canavan Primary Care Mercedees Convery: [name] Other Clinician: Referring Shloime Keilman: [name] Treating Lenin Kuhnle/Extender: Melburn Hake, HOYT Weeks in Treatment: 23 Edema Assessment Assessed: [Left: No] [Right: Yes] Edema: [Left: N] [Right: o] Electronic Signature(s) Signed: 08/22/2019 5:05:31 PM By: Grover Canavan Entered By: Grover Canavan on 08/22/2019 08:31:14 [name] ([id number]) -------------------------------------------------------------------------------- Multi Wound Chart Details Patient Name: [name] Date of Service: 08/22/2019 8:00 AM Medical Record Number: [id number] Patient Account Number:  [id number] Date of Birth/Sex: [birth year] (54 y.o. M) Treating RN: Grover Canavan Primary Care Shamonica Schadt: [name] Other Clinician: Referring Talasia Saulter: [name] Treating Kayona Foor/Extender: Melburn Hake, HOYT Weeks in Treatment: 23 Vital Signs Height(in): 74 Pulse(bpm): 88 Weight(lbs): 308 Blood Pressure(mmHg): 165/76 Body Mass Index(BMI): 40 Temperature(F): 98.6 Respiratory Rate(breaths/min): 20 Photos: [N/A:N/A] Wound Location: Right, Lateral Foot N/A N/A Wounding Event: Gradually Appeared N/A N/A Primary Etiology: Diabetic Wound/Ulcer of the Lower N/A N/A Extremity Comorbid History: Hypertension, Type II Diabetes, N/A N/A Neuropathy Date Acquired: 03/12/2019 N/A N/A Weeks of Treatment: 23 N/A N/A Wound Status: Open N/A N/A Pending Amputation on Yes N/A N/A Presentation: Measurements L x W x D (cm) 0.6x1x0.5 N/A N/A Area (cm) : 0.471 N/A N/A Volume (cm) : 0.236 N/A N/A % Reduction in Area: 25.90% N/A N/A % Reduction in Volume: -85.80% N/A N/A Classification: Grade 1 N/A N/A Exudate Amount: Medium N/A N/A Exudate Type: Serous N/A N/A Exudate Color: amber N/A N/A Wound Margin: Flat and Intact N/A N/A Granulation Amount: Medium (34-66%) N/A N/A Granulation Quality: Pink N/A N/A Necrotic Amount: Medium (34-66%) N/A N/A Exposed Structures: Fat Layer (Subcutaneous Tissue) N/A N/A Exposed: Yes Fascia: No Tendon: No Muscle: No Joint: No Bone: No Epithelialization: Small (1-33%) N/A N/A Treatment Notes Electronic Signature(s) Signed: 08/22/2019 5:05:31 PM By: Grover Canavan Entered By: Grover Canavan on 08/22/2019 08:34:46 JAXSYN, AZAM ([id number]) [name] ([id number]) -------------------------------------------------------------------------------- Multi-Disciplinary Care Plan Details Patient Name: LESLY, JOSLYN. Date of Service: 08/22/2019 8:00 AM Medical Record Number: [id number] Patient Account Number: [id number] Date of  Birth/Sex: [birth year] (54 y.o. M) Treating RN: Grover Canavan Primary Care Keaundre Thelin: [name] Other Clinician: Referring Lugene Hitt: [name] Treating Debrah Granderson/Extender: Melburn Hake, HOYT Weeks in Treatment: 23 Active Inactive Nutrition Nursing Diagnoses: Impaired glucose control: actual or potential Goals: Patient/caregiver verbalizes understanding of need to maintain therapeutic glucose control per primary care physician Date Initiated: 03/13/2019 Target Resolution Date: 04/11/2019 Goal Status: Active Interventions: Assess patient nutrition upon admission and as needed per policy Notes: Wound/Skin Impairment Nursing Diagnoses: Impaired tissue integrity Goals: Ulcer/skin breakdown will have a volume reduction of 30% by week 4 Date Initiated: 03/13/2019 Target Resolution Date: 04/11/2019 Goal Status: Active Interventions: Assess ulceration(s) every visit Notes: Electronic Signature(s) Signed: 08/22/2019 5:05:31 PM By: Grover Canavan Entered By: Grover Canavan on 08/22/2019 08:38:09 [name] ([id number]) -------------------------------------------------------------------------------- Pain Assessment Details Patient Name: [name] Date of  Service: 08/22/2019 8:00 AM Medical Record Number: [id number] Patient Account Number: [id number] Date of Birth/Sex: 31-Mar-1965 (54 y.o. M) Treating RN: Grover Canavan Primary Care Dezarae Mcclaran: [name] Other Clinician: Referring Yadira Hada: [name] Treating Yony Roulston/Extender: Melburn Hake, HOYT Weeks in Treatment: 23 Active Problems Location of Pain Severity and Description of Pain Patient Has Paino No Site Locations Pain Management and Medication Current Pain Management: Electronic Signature(s) Signed: 08/22/2019 5:05:31 PM By: Grover Canavan Entered By: Grover Canavan on 08/22/2019 08:26:46 [name]  ([id number]) -------------------------------------------------------------------------------- Patient/Caregiver Education Details Patient Name: [name] Date of Service: 08/22/2019 8:00 AM Medical Record Number: [id number] Patient Account Number: [id number] Date of Birth/Gender: 12-03-1965 (54 y.o. M) Treating RN: Grover Canavan Primary Care Physician: [name] Other Clinician: Referring Physician: [name] Treating Physician/Extender: Sharalyn Ink in Treatment: 24 Education Assessment Education Provided To: Patient Education Topics Provided Offloading: Handouts: What is Offloadingo Methods: Demonstration, Explain/Verbal Responses: State content correctly Electronic Signature(s) Signed: 08/22/2019 5:05:31 PM By: Grover Canavan Entered By: Grover Canavan on 08/22/2019 08:41:08 [name] ([id number]) -------------------------------------------------------------------------------- Wound Assessment Details Patient Name: [name] Date of Service: 08/22/2019 8:00 AM Medical Record Number: [id number] Patient Account Number: [id number] Date of Birth/Sex: February 06, 1965 (54 y.o. M) Treating RN: Grover Canavan Primary Care Brucha Ahlquist: [name] Other Clinician: Referring Iniya Matzek: [name] Treating Livia Tarr/Extender: Melburn Hake, HOYT Weeks in Treatment: 23 Wound Status Wound Number: 4 Primary Etiology: Diabetic Wound/Ulcer of the Lower Extremity Wound Location: Right, Lateral Foot Wound Status: Open Wounding Event: Gradually Appeared Comorbid History: Hypertension, Type II Diabetes, Neuropathy Date Acquired: 03/12/2019 Weeks Of Treatment: 23 Clustered Wound: No Pending Amputation On Presentation Photos Wound Measurements Length: (cm) 0.3 % Redu Width: (cm) 0.2 % Redu Depth: (cm) 0.2 Epithe Area: (cm) 0.047 Tunne Volume: (cm) 0.009 Under ction in Area: 92.6% ction in Volume: 92.9% lialization: Small  (1-33%) ling: No mining: No Wound Description Classification: Grade 1 Foul O Wound Margin: Flat and Intact Slough Exudate Amount: Medium Exudate Type: Serous Exudate Color: amber dor After Cleansing: No /Fibrino Yes Wound Bed Granulation Amount: Medium (34-66%) Exposed Structure Granulation Quality: Pink Fascia Exposed: No Necrotic Amount: Medium (34-66%) Fat Layer (Subcutaneous Tissue) Exposed: Yes Necrotic Quality: Adherent Slough Tendon Exposed: No Muscle Exposed: No Joint Exposed: No Bone Exposed: No Treatment Notes Wound #4 (Right, Lateral Foot) Notes silver alginate, foam ,TCC right BRYSE, BLANCHETTE ([id number]) Electronic Signature(s) Signed: 08/22/2019 5:05:31 PM By: Grover Canavan Entered By: Grover Canavan on 08/22/2019 08:37:34 [name] ([id number]) -------------------------------------------------------------------------------- Vitals Details Patient Name: [name] Date of Service: 08/22/2019 8:00 AM Medical Record Number: [id number] Patient Account Number: [id number] Date of Birth/Sex: [birth year] (54 y.o. M) Treating RN: Grover Canavan Primary Care Leighton Brickley: [name] Other Clinician: Referring Suki Crockett: [name] Treating Verley Pariseau/Extender: Melburn Hake, HOYT Weeks in Treatment: 23 Vital Signs Time Taken: 08:11 Temperature (F): 98.6 Height (in): 74 Pulse (bpm): 88 Weight (lbs): 308 Respiratory Rate (breaths/min): 20 Body Mass Index (BMI): 39.5 Blood Pressure (mmHg): 165/76 Reference Range: 80 - 120 mg / dl Electronic Signature(s) Signed: 08/22/2019 5:05:31 PM By: Grover Canavan Entered By: Grover Canavan on 08/22/2019 08:12:51

## 2019-08-29 ENCOUNTER — Other Ambulatory Visit: Payer: Self-pay

## 2019-08-29 ENCOUNTER — Encounter: Payer: Managed Care, Other (non HMO) | Admitting: Physician Assistant

## 2019-08-29 DIAGNOSIS — E11621 Type 2 diabetes mellitus with foot ulcer: Secondary | ICD-10-CM | POA: Diagnosis not present

## 2019-08-29 NOTE — Progress Notes (Addendum)
TAQUAN, BRALLEY ([id number]) Visit Report for 08/29/2019 Chief Complaint Document Details Patient Name: [name], [name]. Date of Service: 08/29/2019 8:30 AM Medical Record Number: [id number] Patient Account Number: [id number] Date of Birth/Sex: 1965-06-14 (54 y.o. M) Treating RN: Cornell Barman Primary Care Provider: [name] Other Clinician: Referring Provider: [name] Treating Provider/Extender: Melburn Hake, Henessy Rohrer Weeks in Treatment: 24 Information Obtained from: Patient Chief Complaint Right lateral foot ulcer Electronic Signature(s) Signed: 08/29/2019 8:27:37 AM By: [name] PA-C Entered By: [name] on 08/29/2019 08:27:37 [name] ([id number]) -------------------------------------------------------------------------------- Debridement Details Patient Name: [name] Date of Service: 08/29/2019 8:30 AM Medical Record Number: [id number] Patient Account Number: [id number] Date of Birth/Sex: 11/17/1965 (54 y.o. M) Treating RN: Grover Canavan Primary Care Provider: [name] Other Clinician: Referring Provider: [name] Treating Provider/Extender: Melburn Hake, Isiaah Cuervo Weeks in Treatment: 24 Debridement Performed for Wound #4 Right,Lateral Foot Assessment: Performed By: Physician STONE III, Franky Reier E., PA-C Debridement Type: Debridement Severity of Tissue Pre Debridement: Fat layer exposed Level of Consciousness (Pre- Awake and Alert procedure): Pre-procedure Verification/Time Out Yes - 08:48 Taken: Start Time: 08:49 Pain Control: Lidocaine Total Area Debrided (L x W): 0.3 (cm) x 0.2 (cm) = 0.06 (cm) Tissue and other material Viable, Non-Viable, Callus, Slough, Subcutaneous, Slough debrided: Level: Skin/Subcutaneous Tissue Debridement Description: Excisional Instrument: Curette Bleeding: Minimum Hemostasis Achieved: Pressure End Time: 08:52 Procedural Pain: 0 Post Procedural Pain: 0 Response to  Treatment: Procedure was tolerated well Level of Consciousness (Post- Awake and Alert procedure): Post Debridement Measurements of Total Wound Length: (cm) 1 Width: (cm) 1.6 Depth: (cm) 0.3 Volume: (cm) 0.377 Character of Wound/Ulcer Post Debridement: Improved Severity of Tissue Post Debridement: Fat layer exposed Post Procedure Diagnosis Same as Pre-procedure Electronic Signature(s) Signed: 08/29/2019 4:25:26 PM By: Grover Canavan Signed: 08/29/2019 4:47:28 PM By: [name] PA-C Entered By: Grover Canavan on 08/29/2019 08:53:26 [name] ([id number]) -------------------------------------------------------------------------------- HPI Details Patient Name: [name] Date of Service: 08/29/2019 8:30 AM Medical Record Number: [id number] Patient Account Number: [id number] Date of Birth/Sex: [birth year] (54 y.o. M) Treating RN: Cornell Barman Primary Care Provider: [name] Other Clinician: Referring Provider: [name] Treating Provider/Extender: Melburn Hake, Suren Payne Weeks in Treatment: 24 History of Present Illness HPI Description: This 54 year old male comes with an ulcerated area on the plantar aspect of the right foot which she's had for approximately a month. I have known him from a previous visit at [facility] wound center and was treated in the months of April and May 2016 and rapidly healed a left plantar ulcer with a total contact cast. He has been a diabetic for about 16 years and tries to keep active and is fairly compliant with his diabetes management. 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 Past medical history significant for hypertension, hyperlipidemia, and status post appendectomy 1993. He does not smoke or drink alcohol. 01/14/2015 -- the patient had tolerated his total contact cast very well and had no problems and has had no systemic symptoms. However when his total contact cast was cut open he had excessive amount of  purulent drainage in spite of being on antibiotics. He had had a recent x-ray done in the ER 12/22/2014 which showed IMPRESSION:No evidence of osseous erosion. Known soft tissue ulceration is not well characterized on radiograph. Scattered vascular calcifications seen. his last hemoglobin A1c in December was 7.3. He has been on Augmentin and doxycycline for the last 2 weeks. 01/21/2015 -- his culture grew rare growth of Pantoea species an  MR moderate growth of Candida parapsilosis. it is sensitive to levofloxacin. He has not heard back from the insurance company regarding his hyperbaric oxygen therapy. His MRI has not been done yet and we will try and get him an earlier date 01/28/2015 -- MRI was done last night -- IMPRESSION:1. Soft tissue ulcer overlying the plantar aspect of the fifth metatarsal head extending to the cortex. Subcortical marrow edema in the fifth metatarsal head with corresponding T1 hypointensity is concerning for early osteomyelitis of the plantar lateral aspect of the fifth metatarsal head. Chest x-ray done on 01/14/2015 shows bronchiectatic changes without infiltrate. EKG done on generally 17 2017 shows a normal sinus rhythm and is a normal EKG. 02/04/2015 -- he was asked to see Dr. [name] last week and had 2 appointments but had to cancel both due to pressures of work. Last night he has woken up with severe pain in the foot and leg and it is swollen up. No fever or no change in his blood glucose. Addendum: I spoke with Dr. [name] who kindly agreed to accept the patient for inpatient therapy and have also opened to the hospitalist Dr. [name], and discuss details of the management including PICC line and repeat cultures. 02/12/2015-- -- was seen by Dr. [name] in the hospital and a PICC line was placed. He was to receive Ceftazidime 2 g every 12 hourly, oral levofloxacin 750 mg every 24 hourly and oral fluconazole 200 mg daily. The antibiotics were to be given for 4  weeks except the Diflucan was to be given for the first 2 weeks. Reviewed note from 02/10/2015 -- and Dr. [name] had recommended management for growth of MSSA and Serratia. He switched him from ceftazidime to ceftriaxone 2 g every 24 hours. Levofloxacin was stopped and he would continue on fluconazole for another week. He had asked me to decide whether further imaging was necessary and whether surgical debridement of the infected bone was needed. He is doing well and has been off work for this week and we will keep him off the next week. 02/22/2015 -- he was seen by my colleague on 01/19/2015 and at that time an incision and drainage was done on his right lateral forefoot on the dorsum. Today when I probed this wound it is frankly draining pus and it communicates with the ulcer on the plantar aspect of his right foot. The patient is still on IV ceftriaxone 2 g every 24 hours and is to be seen by Dr. [name] on Friday. 03/19/2015 -- On 03/04/2015 I spoke to Dr. 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 He recommended operative debridement which would probably result in the fifth metatarsal head and toe amputation.The patient would be referred back to [place] once he was done with surgery. He was admitted to [facility] yesterday and had surgery done by podiatry for a right fifth metatarsal acute osteomyelitis with cellulitis and abscess. He had a right foot incision and drainage with fifth metatarsal partial amputation and removal of toe infected bone and soft tissue with cultures. The wound was partially closed and packing of the distal end was done. Patient was already on cefepime 2 g IV every 8 hourly and put on vancomycin pending final cultures. He had grown moderate gram-negative rods, and later found to be rare  diphtheroids. I  received a call from Dr. Cannon Kettle the podiatrist and we discussed the above. On 03/10/2015 he was found positive for influenza a and has been put on Tamiflu. Since his discharge he has been seen by Dr. Cannon Kettle who is planning to remove his sutures this coming week. He was reviewed by Dr. [name] on 03/17/2015 and his Diflucan was stopped and vancomycin. After the 3 doses he is taking. He is going to change Ceftazidime to Zosyn 3.375 g IV every 8 hours. 03/25/2015 -- he was seen by the podiatrist a couple of days ago and the sutures were removed. She will follow back with him in 4 weeks' time and at that time x-rays will be taken and a custom molded insert would be made for his shoe. 04/01/2015 -- he was seen by Dr. [name] on 03/29/2015 who pulled the PICC line stopped his IV antibiotics and recommended starting doxycycline and levofloxacin for 2 weeks. He also stop the fluconazole. The patient will follow up with him only when necessary. Readmission: 03/15/17 on evaluation today patient presents for initial evaluation concerning the new issues although he has previously been evaluated in our clinic. Unfortunately the previous evaluation led to the patient having to proceed to amputation and so he is somewhat nervous about being here OBINNA, EHRESMAN ([id number]) today. With that being said he has a very slight blister that occurred on the plantar aspect more medial on the right great toe that has been present for just a very short amount of time, several days. With that being said he felt like initially when he called that he somewhat overreacted but due to the fact that his previous issue led to amputation he is very cautious these days I explained that he did the right thing. With that being said he has been tolerating the dressing changes without complication mainly he just been covering this. He does not have any discomfort in secondary to neuropathy it's unlikely that he  feels much. With that being said he does tell me that the issue that he had here is that he went barefoot when he knows he should not have which subsequently led to the blisters. He states is definitely not doing that anymore. His most recent hemoglobin A1c was December and registered 6.9 his ABI today was 1.1. 03/27/17 on evaluation today patient appears to be doing great in regard to his right great toe ulcer. He has been tolerating the dressing changes without complication. The good news is this is making excellent progress and he seems to be caring for this in an excellent fashion. I see no evidence of breakdown that would make me concerned that he was at risk for infection/amputation. This has obviously been his concern due to the fifth toe amputation that was necessitated previous when he had a similar issue. Nonetheless this seems to be progressing much more nicely. Readmission: 03/13/2019 upon evaluation today patient presents for reevaluation here in the clinic concerning issues he has been having with his right foot on the lateral portion at the proximal end of the metatarsal. Subsequently he tells me that this just opened up in the past couple of days or so and the erythema really began about 24 to 48 hours ago. Fortunately there is no signs of systemic infection. 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 That left him with this bony prominence that is where the wound is currently. There is erythema surrounding that does have me concerned  about cellulitis. With that being said he was noncompressible as far as ABIs are concerned I do think we can need to check into arterial studies at this point. Patient does have a history of hypertension along with diabetes mellitus type 2. He also tends to develop quite a bit of callus white often. 03/20/2019 upon evaluation today patient appears to be doing very well in regard to his wounds currently. He has  been tolerating the dressing changes without complication. Fortunately there is no signs of infection. His ABIs were good at this point and registered at 1.07 on the left and 1.09 on the right. In regard to the x-ray this was negative as well for any signs of osteomyelitis. The patient also seems to be doing better in regard to the infection. He still has several days of the antibiotic left at this point best the Bactrim DS and he seems to be doing very well with this. Overall I am extremely pleased with the progress in 1 week's time he is going require some sharp debridement today however. 03/27/2019 upon evaluation today patient actually appears to be making some progress in regard to his wound. It is a little deeper but measuring smaller which is good news due to the fact that again were clear away some of the necrotic tissue which is why the depth is increasing a little bit. Nonetheless after like were getting very close to being down at a good wound bed. Fortunately there is no signs of active infection at this time. There is a little bit of erythema immediately surrounding the wound that we do want to be very cognizant of and careful about. For that reason I am going to go ahead and extend his antibiotic today for an additional 10 days that is the Bactrim. 04/03/2019 upon evaluation today patient appears to be making some progress. I do feel like the Annitta Needs is doing a good job along with the alginate is being packed into the wound space behind. He has been tolerating the dressing changes without complication. Fortunately there is no signs of active infection at this time. No fevers, chills, nausea, vomiting, or diarrhea. 04/10/2019 upon evaluation today patient appears to be making progress. The wound is not quite as deep but he still does not have a great wound surface yet for working on this and the Santyl does seem to be cleaning things up. Fortunately there is no evidence of active infection at  this time. No fevers, chills, nausea, vomiting, or diarrhea. 04/17/2019 upon evaluation today patient appears to be doing excellent at this time in regard to his foot ulcer. I do believe the Santyl with the alginate packed in behind has been of benefit for him and I am very pleased in this regard. With that being said I am feeling like the wound surface is improving quite a bit each time I see him still I do not believe we are at the point of stating that the wound bed is perfect but again were making progress. The patient is going require some sharp debridement today. 4/22; this is a patient who is using Santyl with calcium alginate. Apparently his wound dimensions have been improving. Patient is changing the dressing himself. He has a surgical shoe. He works in a sitting position therefore is not on his feet all that much. There is undermining 05/01/2019 upon evaluation today patient appears to be doing a little better in regard to the size of his wound though he still has some depth.  This does seem to be much cleaner has been using Santyl on the base of the wound followed by packing with silver alginate and behind. Nonetheless I do believe that based on what we are seeing we may need to switch to a collagen type dressing endoform may actually be excellent for him. 05/08/2019 upon evaluation today patient's wound actually appears to be showing better granulation tissue in the base of the wound. With that being said he does have some epiboly noted around the edges of the wound especially plantar and more distal. Subsequently this is can require sharp debridement today. Fortunately there is no signs of active systemic infection though I do feel like there is some local cellulitis noted at this point. The patient also notes has had increased drainage. 5/13; wound volume quite a bit improved this week down to 3 mm. Patient states pain and drainage better. Culture from last week grew staph aureus [not MRSA]  and this is sensitive to quinolones and he is on Levaquin. HOWEVER he also grew abundant Enterococcus faecalis treatment of choice for this is ampicillin. Quinolone coverage is unreliable 5/20 completing the Augmentin. Small wound on the right lateral foot. Thick rolled edges around the wound. Using silver alginate 05/29/2019 upon evaluation today patient appears to be doing better compared to last time I saw him. Fortunately there is no signs of active infection and I do feel like he is actually making good progress. Overall the quality of the wound bed along with the size looks improved and the inflammation/erythema that was previously noted by myself when I saw the couple weeks back actually has also resolved and this is great. Overall very pleased with how things stand. 06/06/2019 upon evaluation today patient actually appears to be doing quite well with regard to his foot ulcer with regard to some of the granulation were seen at this point. There does not appear to be any evidence of systemic or local infection although he still has discomfort I feel like things are moving in a slow but surely correct direction. I think we may need to help fill some of the space however even though he is using the collagen we may need to have something behind this such as a plain packing strip to try to help hold the collagen in the base of the wound. 06/12/2019 upon evaluation today patient appears to be doing well with regard to his foot ulcer. This seems to be making some slow but steady progress. The wound does not appear to be as deep today compared to where it was previous. Fortunately there is no signs of active infection at this time. No fevers, chills, nausea, vomiting, or diarrhea. 06/19/2019 on evaluation today patient's wound does not appear to be doing quite as well as what I would like to see. Fortunately there is no signs of systemic infection at this time which is great news. With that being said I do  believe that the patient needs to have a wound culture as I do JOVAUGHN, WOJTASZEK. ([id number]) believe he likely has a local infection that needs to be addressed. He is also can require some debridement today as well. 06/26/2019 upon evaluation today patient's wound actually appears to be showing signs of excellent improvement today. There does not appear to be any evidence of active infection which is great news. There does not appear to be significant erythema at this point. The patient does have improvement overall in the appearance of the infection he has been taking  the Augmentin the culture came back showing no organism was grown at this point but nonetheless I do believe he has been doing well with the antibiotics I would continue that at this point. 07/03/2019 upon evaluation today patient appears to be doing about the same in regard to his wound. There does not appear to be any evidence of active infection and overall I am pleased with where things stand. The patient is tolerating the dressing changes without complication but it does not appear that they are packing the wound at this time which I think is going to slow down his healing prospects. 07/10/2019 upon evaluation today patient appears to be doing little better in my opinion in regard to the overall moisture collected in the base of the wound. They have been packing this with a little bit of silver alginate and that has done quite well. Overall I am pleased in that regard. Subsequently the redness does not appear to be any worse today I did actually go ahead and marked this since this can be 2 weeks between now and when I see him again I would make sure that if anything changes or worsens he will be able to notice this and go ahead and initiate treatment with the Augmentin. 07/22/2019 patient comes in today a little bit early for evaluation secondary to the fact that he was having issues with increased drainage and concern about  infection. With that being said he does appear to have developed a blister area again proximal to where the original wound opening was in although the wound bed itself actually appears to be doing somewhat better he has had increased drainage and tracking of fluid here due to this blister. Nonetheless I believe that that does need to be cleaned away today but again I am not really certain that I see a lot of evidence for infection at this point. 07/29/2019 on evaluation today patient actually appears to be doing better in regard to his foot ulcer. There is improvement compared to last week which is great news. Nonetheless he does come prepared to apply the total contact cast today which I think would be beneficial for him. He knows he is not supposed to drive he did bring his son with him today in order to drive him. 07/31/2019 upon evaluation today patient appears to be doing well with regard to his wound today. I do not see any signs of infection I do believe that he is definitely showing some signs of improvement here in the office in a couple days as far as the overall erythema and pressure getting to the wound area is concerned. With that being said I do believe that the cast has been of great benefit for him. 08/08/2019 upon evaluation today patient appears to be doing better in regard to his wound. Overall I feel like he is showing signs of improvement. The external measurement is really not bad at all although I did remove some of the callus in order to open up the wound itself the depth is actually coming quite well and I am extremely pleased in that regard I do not think we can to see much improvement externally until we get the internal depth filled in and that again is improved. Fortunately there is no signs of active infection at this time. No fevers, chills, nausea, vomiting, or diarrhea. 08/15/2019 on evaluation today patient actually appears to be doing well with regard to his foot ulcer.  This is showing some signs of  minimal improvement from the standpoint of the depth and appearance of the wound as well as the edges of the wound. It is taking its time and to be honest I do believe that he seems to be progressing well. I am happy with the cast and what it is achieving at this point. Have contemplated checking into Dermagraft for Apligraf for the patient but again he does seem to be making fairly good progress in my opinion. If it has not tremendously changed come next week I may consider one of the 2. 08/22/2019 on evaluation today patient appears to be making good progress with regard to his wound I am still very pleased with where things stand today. Overall I think that he is making good improvements week by week and I believe that we are getting continue with the Total contact cast I will likely switch to a silver alginate dressing. 08/29/2019 on evaluation today patient appears to be doing quite well with regard to his wound. Fortunately this is measuring quite small. There is no signs of active infection at this time although he did have a little bit of a callus area just to the side of his foot where we previously noted some fluid collecting underneath that was the case today as well. I did have to remove this. With that being said overall he seems to be doing quite well. Electronic Signature(s) Signed: 08/29/2019 8:58:15 AM By: [name] PA-C Entered By: [name] on 08/29/2019 08:58:14 [name] ([id number]) -------------------------------------------------------------------------------- Physical Exam Details Patient Name: ARBY, DAHIR Date of Service: 08/29/2019 8:30 AM Medical Record Number: [id number] Patient Account Number: [id number] Date of Birth/Sex: 09-Jun-1965 (54 y.o. M) Treating RN: Cornell Barman Primary Care Provider: [name] Other Clinician: Referring Provider: [name] Treating Provider/Extender: Melburn Hake,  Winter Trefz Weeks in Treatment: 72 Constitutional Well-nourished and well-hydrated in no acute distress. Respiratory normal breathing without difficulty. Psychiatric this patient is able to make decisions and demonstrates good insight into disease process. Alert and Oriented x 3. pleasant and cooperative. Notes Patient's wound bed currently showed signs of good granulation there was some slough noted I did perform sharp debridement to clear this away today. Also removed some of the callus that was hiding a little fluid collection off to the lateral portion of his foot. The patient tolerated all this today without complication. Overall I feel like he is very close to complete resolution based on the sizes and measurements of the wound today. Electronic Signature(s) Signed: 08/29/2019 8:58:39 AM By: [name] PA-C Entered By: [name] on 08/29/2019 08:58:39 [name] ([id number]) -------------------------------------------------------------------------------- Physician Orders Details Patient Name: [name] Date of Service: 08/29/2019 8:30 AM Medical Record Number: [id number] Patient Account Number: [id number] Date of Birth/Sex: [birth year] (54 y.o. 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 - in office o Dial antibacterial soap, wash wounds, rinse and pat dry prior to  dressing wounds Anesthetic (add to Medication List) Wound #4 Right,Lateral Foot o Topical Lidocaine 4%  cream applied to wound bed prior to debridement (In Clinic Only). Primary Wound Dressing Wound #4 Right,Lateral Foot o Silver Alginate Secondary Dressing Wound #4 Right,Lateral Foot o Foam - 3rd on bony prominences Dressing Change Frequency Wound #4 Right,Lateral Foot o Change dressing every week Follow-up Appointments Wound #4 Right,Lateral Foot o Return Appointment in 1 week. Off-Loading Wound #4 Right,Lateral Foot o Total Contact Cast to Right Lower Extremity Electronic Signature(s) Signed: 08/29/2019 4:25:26 PM By: Grover Canavan Signed: 08/29/2019 4:47:28 PM By: [name] PA-C Entered By: Grover Canavan on 08/29/2019 08:54:18 RAMZEY, PETROVIC ([id number]) -------------------------------------------------------------------------------- Problem List Details Patient Name: [name], [name]. Date of Service: 08/29/2019 8:30 AM Medical Record Number: [id number] Patient Account Number: [id number] Date of Birth/Sex: 1965-07-15 (54 y.o. M) Treating RN: Cornell Barman Primary Care Provider: [name] Other Clinician: Referring Provider: [name] Treating Provider/Extender: Melburn Hake, Lezli Danek Weeks in Treatment: 24 Active Problems ICD-10 Encounter Code Description Active Date MDM Diagnosis E11.621 Type 2 diabetes mellitus with foot ulcer 03/13/2019 No Yes L97.512 Non-pressure chronic ulcer of other part of right foot with fat layer 03/13/2019 No Yes exposed L03.115 Cellulitis of right lower limb 05/15/2019 No Yes L84 Corns and callosities 03/13/2019 No Yes I10 Essential (primary) hypertension 03/13/2019 No Yes 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 M) Treating RN: Cornell Barman Primary Care Provider: [name] Other Clinician: Referring Provider: [name] Treating Provider/Extender: Melburn Hake, Kenly Henckel Weeks in Treatment: 24 Subjective Chief Complaint Information obtained from Patient Right lateral foot ulcer History of Present Illness (HPI) This 54 year old male comes with an ulcerated area on the plantar aspect of the right foot which she's had for approximately a month. I have known him from a previous visit at [facility] wound center and was treated in the months of April and May 2016 and rapidly healed a left plantar ulcer with a total contact cast. He has been a diabetic for about 16 years and tries to keep active and is fairly compliant with his diabetes management. 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 Past medical history significant for hypertension, hyperlipidemia, and status post appendectomy 1993. He does not smoke or drink alcohol. 01/14/2015 -- the patient had tolerated his total contact cast very well and had no problems and has had no systemic symptoms. However when his total contact cast was cut open he had excessive amount of purulent drainage in spite of being on antibiotics. He had had a recent x-ray done in the ER 12/22/2014 which showed IMPRESSION:No evidence of osseous erosion. Known soft tissue ulceration is not well characterized on radiograph. Scattered vascular calcifications seen. his last hemoglobin A1c in December was 7.3. He has been on Augmentin and doxycycline for the last 2 weeks. 01/21/2015 -- his culture grew rare growth of Pantoea species an MR moderate growth of  Candida parapsilosis. it is sensitive to levofloxacin. He has not heard back from the insurance company regarding his hyperbaric oxygen therapy. His MRI has not been done yet and we will try and get him an earlier date 01/28/2015 -- MRI was done last night -- IMPRESSION:1. Soft tissue ulcer overlying the plantar aspect of the fifth metatarsal head  extending to the cortex. Subcortical marrow edema in the fifth metatarsal head with corresponding T1 hypointensity is concerning for early osteomyelitis of the plantar lateral aspect of the fifth metatarsal head. Chest x-ray done on 01/14/2015 shows bronchiectatic changes without infiltrate. EKG done on generally 17 2017 shows a normal sinus rhythm and is a normal EKG. 02/04/2015 -- he was asked to see Dr. [name] last week and had 2 appointments but had to cancel both due to pressures of work. Last night he has woken up with severe pain in the foot and leg and it is swollen up. No fever or no change in his blood glucose. Addendum: I spoke with Dr. [name] who kindly agreed to accept the patient for inpatient therapy and have also opened to the hospitalist Dr. [name], and discuss details of the management including PICC line and repeat cultures. 02/12/2015-- -- was seen by Dr. [name] in the hospital and a PICC line was placed. He was to receive Ceftazidime 2 g every 12 hourly, oral levofloxacin 750 mg every 24 hourly and oral fluconazole 200 mg daily. The antibiotics were to be given for 4 weeks except the Diflucan was to be given for the first 2 weeks. Reviewed note from 02/10/2015 -- and Dr. [name] had recommended management for growth of MSSA and Serratia. He switched him from ceftazidime to ceftriaxone 2 g every 24 hours. Levofloxacin was stopped and he would continue on fluconazole for another week. He had asked me to decide whether further imaging was necessary and whether surgical debridement of the infected bone was needed. He is doing  well and has been off work for this week and we will keep him off the next week. 02/22/2015 -- he was seen by my colleague on 01/19/2015 and at that time an incision and drainage was done on his right lateral forefoot on the dorsum. Today when I probed this wound it is frankly draining pus and it communicates with the ulcer on the plantar aspect of his right foot. The patient is still on IV ceftriaxone 2 g every 24 hours and is to be seen by Dr. [name] on Friday. 03/19/2015 -- On 03/04/2015 I spoke to Dr. 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 He recommended operative debridement which would probably result in the fifth metatarsal head and toe amputation.The patient would be referred back to [place] once he was done with surgery. He was admitted to [facility] yesterday and had surgery done by podiatry for a right fifth metatarsal acute osteomyelitis with cellulitis and abscess. He had a right foot incision and drainage with fifth metatarsal partial amputation and removal of toe infected bone and soft tissue with cultures. The wound was partially closed and packing of the distal end was done. Patient was already on cefepime 2 g IV every 8 hourly and put on vancomycin pending final cultures. He had grown moderate gram-negative rods, and later found to be rare diphtheroids. I received a call from Dr. Cannon Kettle the podiatrist and we discussed the above. On 03/10/2015 he was found positive for influenza a and has been put on Tamiflu. Since his discharge he has been seen by Dr. Cannon Kettle who is planning to remove his sutures this coming week. He was reviewed by Dr. [name] on 03/17/2015 and his Diflucan was stopped and vancomycin. After  the 3 doses he is taking. He is going to change Ceftazidime to Zosyn 3.375 g IV every 8  hours. 03/25/2015 -- he was seen by the podiatrist a couple of days ago and the sutures were removed. She will follow back with him in 4 weeks' time and at that time x-rays will be taken and a custom molded insert would be made for his shoe. 04/01/2015 -- he was seen by Dr. [name] on 03/29/2015 who pulled the PICC line stopped his IV antibiotics and recommended starting doxycycline and levofloxacin for 2 weeks. He also stop the fluconazole. The patient will follow up with him only when necessary. CHEVY, SWEIGERT ([id number]) Readmission: 03/15/17 on evaluation today patient presents for initial evaluation concerning the new issues although he has previously been evaluated in our clinic. Unfortunately the previous evaluation led to the patient having to proceed to amputation and so he is somewhat nervous about being here today. With that being said he has a very slight blister that occurred on the plantar aspect more medial on the right great toe that has been present for just a very short amount of time, several days. With that being said he felt like initially when he called that he somewhat overreacted but due to the fact that his previous issue led to amputation he is very cautious these days I explained that he did the right thing. With that being said he has been tolerating the dressing changes without complication mainly he just been covering this. He does not have any discomfort in secondary to neuropathy it's unlikely that he feels much. With that being said he does tell me that the issue that he had here is that he went barefoot when he knows he should not have which subsequently led to the blisters. He states is definitely not doing that anymore. His most recent hemoglobin A1c was December and registered 6.9 his ABI today was 1.1. 03/27/17 on evaluation today patient appears to be doing great in regard to his right great toe ulcer. He has been tolerating the dressing changes without  complication. The good news is this is making excellent progress and he seems to be caring for this in an excellent fashion. I see no evidence of breakdown that would make me concerned that he was at risk for infection/amputation. This has obviously been his concern due to the fifth toe amputation that was necessitated previous when he had a similar issue. Nonetheless this seems to be progressing much more nicely. Readmission: 03/13/2019 upon evaluation today patient presents for reevaluation here in the clinic concerning issues he has been having with his right foot on the lateral portion at the proximal end of the metatarsal. Subsequently he tells me that this just opened up in the past couple of days or so and the erythema really began about 24 to 48 hours ago. Fortunately there is no signs of systemic infection. 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 That left him with this bony prominence that is where the wound is currently. There is erythema surrounding that does have me concerned about cellulitis. With that being said he was noncompressible as far as ABIs are concerned I do think we can need to check into arterial studies at this point. Patient does have a history of hypertension along with diabetes mellitus type 2. He also tends to develop quite a bit of callus white often. 03/20/2019 upon evaluation today patient appears to be doing  very well in regard to his wounds currently. He has been tolerating the dressing changes without complication. Fortunately there is no signs of infection. His ABIs were good at this point and registered at 1.07 on the left and 1.09 on the right. In regard to the x-ray this was negative as well for any signs of osteomyelitis. The patient also seems to be doing better in regard to the infection. He still has several days of the antibiotic left at this point best the Bactrim DS and he seems to be doing very well  with this. Overall I am extremely pleased with the progress in 1 week's time he is going require some sharp debridement today however. 03/27/2019 upon evaluation today patient actually appears to be making some progress in regard to his wound. It is a little deeper but measuring smaller which is good news due to the fact that again were clear away some of the necrotic tissue which is why the depth is increasing a little bit. Nonetheless after like were getting very close to being down at a good wound bed. Fortunately there is no signs of active infection at this time. There is a little bit of erythema immediately surrounding the wound that we do want to be very cognizant of and careful about. For that reason I am going to go ahead and extend his antibiotic today for an additional 10 days that is the Bactrim. 04/03/2019 upon evaluation today patient appears to be making some progress. I do feel like the Annitta Needs is doing a good job along with the alginate is being packed into the wound space behind. He has been tolerating the dressing changes without complication. Fortunately there is no signs of active infection at this time. No fevers, chills, nausea, vomiting, or diarrhea. 04/10/2019 upon evaluation today patient appears to be making progress. The wound is not quite as deep but he still does not have a great wound surface yet for working on this and the Santyl does seem to be cleaning things up. Fortunately there is no evidence of active infection at this time. No fevers, chills, nausea, vomiting, or diarrhea. 04/17/2019 upon evaluation today patient appears to be doing excellent at this time in regard to his foot ulcer. I do believe the Santyl with the alginate packed in behind has been of benefit for him and I am very pleased in this regard. With that being said I am feeling like the wound surface is improving quite a bit each time I see him still I do not believe we are at the point of stating that the  wound bed is perfect but again were making progress. The patient is going require some sharp debridement today. 4/22; this is a patient who is using Santyl with calcium alginate. Apparently his wound dimensions have been improving. Patient is changing the dressing himself. He has a surgical shoe. He works in a sitting position therefore is not on his feet all that much. There is undermining 05/01/2019 upon evaluation today patient appears to be doing a little better in regard to the size of his wound though he still has some depth. This does seem to be much cleaner has been using Santyl on the base of the wound followed by packing with silver alginate and behind. Nonetheless I do believe that based on what we are seeing we may need to switch to a collagen type dressing endoform may actually be excellent for him. 05/08/2019 upon evaluation today patient's wound actually appears to be  showing better granulation tissue in the base of the wound. With that being said he does have some epiboly noted around the edges of the wound especially plantar and more distal. Subsequently this is can require sharp debridement today. Fortunately there is no signs of active systemic infection though I do feel like there is some local cellulitis noted at this point. The patient also notes has had increased drainage. 5/13; wound volume quite a bit improved this week down to 3 mm. Patient states pain and drainage better. Culture from last week grew staph aureus [not MRSA] and this is sensitive to quinolones and he is on Levaquin. HOWEVER he also grew abundant Enterococcus faecalis treatment of choice for this is ampicillin. Quinolone coverage is unreliable 5/20 completing the Augmentin. Small wound on the right lateral foot. Thick rolled edges around the wound. Using silver alginate 05/29/2019 upon evaluation today patient appears to be doing better compared to last time I saw him. Fortunately there is no signs of  active infection and I do feel like he is actually making good progress. Overall the quality of the wound bed along with the size looks improved and the inflammation/erythema that was previously noted by myself when I saw the couple weeks back actually has also resolved and this is great. Overall very pleased with how things stand. 06/06/2019 upon evaluation today patient actually appears to be doing quite well with regard to his foot ulcer with regard to some of the granulation were seen at this point. There does not appear to be any evidence of systemic or local infection although he still has discomfort I feel like things are moving in a slow but surely correct direction. I think we may need to help fill some of the space however even though he is using the collagen we may need to have something behind this such as a plain packing strip to try to help hold the collagen in the base of the wound. 06/12/2019 upon evaluation today patient appears to be doing well with regard to his foot ulcer. This seems to be making some slow but steady progress. The wound does not appear to be as deep today compared to where it was previous. Fortunately there is no signs of active infection at [name], [name]. ([id number]) this time. No fevers, chills, nausea, vomiting, or diarrhea. 06/19/2019 on evaluation today patient's wound does not appear to be doing quite as well as what I would like to see. Fortunately there is no signs of systemic infection at this time which is great news. With that being said I do believe that the patient needs to have a wound culture as I do believe he likely has a local infection that needs to be addressed. He is also can require some debridement today as well. 06/26/2019 upon evaluation today patient's wound actually appears to be showing signs of excellent improvement today. There does not appear to be any evidence of active infection which is great news. There does not appear to be  significant erythema at this point. The patient does have improvement overall in the appearance of the infection he has been taking the Augmentin the culture came back showing no organism was grown at this point but nonetheless I do believe he has been doing well with the antibiotics I would continue that at this point. 07/03/2019 upon evaluation today patient appears to be doing about the same in regard to his wound. There does not appear to be any evidence of active infection and  overall I am pleased with where things stand. The patient is tolerating the dressing changes without complication but it does not appear that they are packing the wound at this time which I think is going to slow down his healing prospects. 07/10/2019 upon evaluation today patient appears to be doing little better in my opinion in regard to the overall moisture collected in the base of the wound. They have been packing this with a little bit of silver alginate and that has done quite well. Overall I am pleased in that regard. Subsequently the redness does not appear to be any worse today I did actually go ahead and marked this since this can be 2 weeks between now and when I see him again I would make sure that if anything changes or worsens he will be able to notice this and go ahead and initiate treatment with the Augmentin. 07/22/2019 patient comes in today a little bit early for evaluation secondary to the fact that he was having issues with increased drainage and concern about infection. With that being said he does appear to have developed a blister area again proximal to where the original wound opening was in although the wound bed itself actually appears to be doing somewhat better he has had increased drainage and tracking of fluid here due to this blister. Nonetheless I believe that that does need to be cleaned away today but again I am not really certain that I see a lot of evidence for infection at this  point. 07/29/2019 on evaluation today patient actually appears to be doing better in regard to his foot ulcer. There is improvement compared to last week which is great news. Nonetheless he does come prepared to apply the total contact cast today which I think would be beneficial for him. He knows he is not supposed to drive he did bring his son with him today in order to drive him. 07/31/2019 upon evaluation today patient appears to be doing well with regard to his wound today. I do not see any signs of infection I do believe that he is definitely showing some signs of improvement here in the office in a couple days as far as the overall erythema and pressure getting to the wound area is concerned. With that being said I do believe that the cast has been of great benefit for him. 08/08/2019 upon evaluation today patient appears to be doing better in regard to his wound. Overall I feel like he is showing signs of improvement. The external measurement is really not bad at all although I did remove some of the callus in order to open up the wound itself the depth is actually coming quite well and I am extremely pleased in that regard I do not think we can to see much improvement externally until we get the internal depth filled in and that again is improved. Fortunately there is no signs of active infection at this time. No fevers, chills, nausea, vomiting, or diarrhea. 08/15/2019 on evaluation today patient actually appears to be doing well with regard to his foot ulcer. This is showing some signs of minimal improvement from the standpoint of the depth and appearance of the wound as well as the edges of the wound. It is taking its time and to be honest I do believe that he seems to be progressing well. I am happy with the cast and what it is achieving at this point. Have contemplated checking into Dermagraft for Apligraf for the patient  but again he does seem to be making fairly good progress in my opinion.  If it has not tremendously changed come next week I may consider one of the 2. 08/22/2019 on evaluation today patient appears to be making good progress with regard to his wound I am still very pleased with where things stand today. Overall I think that he is making good improvements week by week and I believe that we are getting continue with the Total contact cast I will likely switch to a silver alginate dressing. 08/29/2019 on evaluation today patient appears to be doing quite well with regard to his wound. Fortunately this is measuring quite small. There is no signs of active infection at this time although he did have a little bit of a callus area just to the side of his foot where we previously noted some fluid collecting underneath that was the case today as well. I did have to remove this. With that being said overall he seems to be doing quite well. Objective Constitutional Well-nourished and well-hydrated in no acute distress. Vitals Time Taken: 8:24 AM, Height: 74 in, Weight: 308 lbs, BMI: 39.5, Temperature: 98.5 F, Pulse: 79 bpm, Respiratory Rate: 16 breaths/min, Blood Pressure: 136/65 mmHg. Respiratory normal breathing without difficulty. Psychiatric this patient is able to make decisions and demonstrates good insight into disease process. Alert and Oriented x 3. pleasant and cooperative. TABB, CROGHAN ([id number]) General Notes: Patient's wound bed currently showed signs of good granulation there was some slough noted I did perform sharp debridement to clear this away today. Also removed some of the callus that was hiding a little fluid collection off to the lateral portion of his foot. The patient tolerated all this today without complication. Overall I feel like he is very close to complete resolution based on the sizes and measurements of the wound today. Integumentary (Hair, Skin) Wound #4 status is Open. Original cause of wound was Gradually Appeared. The wound is  located on the Right,Lateral Foot. The wound measures 0.3cm length x 0.2cm width x 0.2cm depth; 0.047cm^2 area and 0.009cm^3 volume. There is Fat Layer (Subcutaneous Tissue) exposed. There is undermining starting at 12:00 and ending at 12:00 with a maximum distance of 0.2cm. There is a medium amount of serous drainage noted. The wound margin is flat and intact. There is large (67-100%) pink granulation within the wound bed. There is a small (1-33%) amount of necrotic tissue within the wound bed including Adherent Slough. Assessment Active Problems ICD-10 Type 2 diabetes mellitus with foot ulcer 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d Cellulitis of right lower limb Corns and callosities Essential (primary) hypertension Acquired absence of other left toe(s) Procedures Wound #4 Pre-procedure diagnosis of Wound #4 is a Diabetic Wound/Ulcer of the Lower Extremity located on the Right,Lateral Foot .Severity of Tissue Pre Debridement is: Fat layer exposed. There was a Excisional Skin/Subcutaneous Tissue Debridement with a total area of 0.06 sq cm performed by STONE III, Zoei Amison E., PA-C. With the following instrument(s): Curette to remove Viable and Non-Viable tissue/material. Material removed includes Callus, Subcutaneous Tissue, and Slough after achieving pain control using Lidocaine. A time out was conducted at 08:48, prior to the start of the procedure. A Minimum amount of bleeding was controlled with Pressure. The procedure was tolerated well with a pain level of 0 throughout and a pain level of 0 following the procedure. Post Debridement Measurements: 1cm length x 1.6cm width x 0.3cm depth; 0.377cm^3 volume. Character of  Wound/Ulcer Post Debridement is improved. Severity of Tissue Post Debridement is: Fat layer exposed. Post procedure Diagnosis Wound #4: Same as Pre-Procedure Pre-procedure diagnosis of Wound #4 is a Diabetic Wound/Ulcer of the Lower Extremity  located on the Right,Lateral Foot . There was a Total Contact Cast Procedure by STONE III, Jashua Knaak E., PA-C. Post procedure Diagnosis Wound #4: Same as Pre-Procedure Plan Wound Cleansing: Wound #4 Right,Lateral Foot: Clean wound with Normal Saline. - in office Dial antibacterial soap, wash wounds, rinse and pat dry prior to dressing wounds Anesthetic (add to Medication List): Wound #4 Right,Lateral Foot: Topical Lidocaine 4% cream applied to wound bed prior to debridement (In Clinic Only). Primary Wound Dressing: Wound #4 Right,Lateral Foot: Silver Alginate Secondary Dressing: Wound #4 Right,Lateral Foot: Foam - 3rd on bony prominences Dressing Change Frequency: Wound #4 Right,Lateral Foot: Change dressing every week Follow-up Appointments: SOLLY, DERASMO ([id number]) Wound #4 Right,Lateral Foot: Return Appointment in 1 week. Off-Loading: Wound #4 Right,Lateral Foot: Total Contact Cast to Right Lower Extremity 1. I would recommend we going continue with the wound care measures as before specifically with regard to the silver alginate dressing which I think is doing a great job. 2. I am also can recommend that we padded with foam over the bony prominences. 3. I would also recommend that we continue to the total contact cast I think that is still an appropriate thing to do at this point. We will see patient back for reevaluation in 1 week here in the clinic. If anything worsens or changes patient will contact our office for additional recommendations. Electronic Signature(s) Signed: 08/29/2019 8:59:16 AM By: [name] PA-C Entered By: [name] on 08/29/2019 08:59:16 [name] ([id number]) -------------------------------------------------------------------------------- Total Contact Cast Details Patient Name: NATHYN, LUIZ. Date of Service: 08/29/2019 8:30 AM Medical Record Number: [id number] Patient Account Number: [id number] Date of Birth/Sex:  07/03/1965 (54 y.o. M) Treating RN: Grover Canavan Primary Care Provider: [name] Other Clinician: Referring Provider: [name] Treating Provider/Extender: Melburn Hake, Hussein Macdougal Weeks in Treatment: 24 Total Contact Cast Applied for Wound Assessment: Wound #4 Right,Lateral Foot Performed By: Physician Emilio Math., PA-C Post Procedure Diagnosis Same as Pre-procedure Electronic Signature(s) Signed: 08/29/2019 4:25:26 PM By: Grover Canavan Signed: 08/29/2019 4:47:28 PM By: [name] PA-C Entered By: Grover Canavan on 08/29/2019 08:55:08 [name] ([id number]) -------------------------------------------------------------------------------- [place] Details Patient Name: [name] Date of Service: 08/29/2019 Medical Record Number: [id number] Patient Account Number: [id number] Date of Birth/Sex: 06-17-1965 (54 y.o. M) Treating RN: Grover Canavan Primary Care Provider: [name] Other Clinician: Referring Provider: [name] Treating Provider/Extender: Melburn Hake, Terrianne Cavness Weeks in Treatment: 24 Diagnosis Coding ICD-10 Codes Code Description E11.621 Type 2 diabetes mellitus with foot ulcer L97.512 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d L03.115 Cellulitis of right lower limb L84 Corns and callosities I10 Essential (primary) hypertension Z89.422 Acquired absence of other left toe(s) Facility Procedures CPT4 Code: 64403474 Description: 25956 - DEB SUBQ TISSUE 20 SQ CM/< Modifier: Quantity: 1 CPT4 Code: Description: ICD-10 Diagnosis Description E11.621 Type 2 diabetes mellitus with foot ulcer Modifier: Quantity: Physician Procedures CPT4 Code: 3875643 Description: 32951 - WC PHYS SUBQ TISS 20 SQ CM Modifier: Quantity: 1 CPT4 Code: Description: ICD-10 Diagnosis Description E11.621 Type 2 diabetes mellitus with foot ulcer Modifier: Quantity: Electronic Signature(s) Signed:  08/29/2019 9:01:13 AM By: [name] PA-C Entered By: [name] on 08/29/2019 09:01:12

## 2019-08-30 NOTE — Progress Notes (Signed)
FRIEND, DORFMAN ([id number]) Visit Report for 08/29/2019 Arrival Information Details Patient Name: [name], [name]. Date of Service: 08/29/2019 8:30 AM Medical Record Number: [id number] Patient Account Number: [id number] Date of Birth/Sex: [birth year] (54 y.o. M) Treating RN: Grover Canavan Primary Care Azekiel Cremer: [name] Other Clinician: Referring Everson Mott: [name] Treating Meilyn Heindl/Extender: Melburn Hake, HOYT Weeks in Treatment: 24 Visit Information History Since Last Visit Added or deleted any medications: No Patient Arrived: Ambulatory Had a fall or experienced change in No Arrival Time: 08:22 activities of daily living that may affect Accompanied By: self risk of falls: Transfer Assistance: None Hospitalized since last visit: No Patient Requires Transmission-Based Precautions: No Has Dressing in Place as Prescribed: Yes Patient Has Alerts: Yes Pain Present Now: No Patient Alerts: DMII Electronic Signature(s) Signed: 08/29/2019 4:25:26 PM By: Grover Canavan Entered By: Grover Canavan on 08/29/2019 08:23:31 [name] ([id number]) -------------------------------------------------------------------------------- Encounter Discharge Information Details Patient Name: [name] Date of Service: 08/29/2019 8:30 AM Medical Record Number: [id number] Patient Account Number: [id number] Date of Birth/Sex: Dec 28, 1965 (54 y.o. M) Treating RN: Grover Canavan Primary Care Ocie Tino: [name] Other Clinician: Referring Shaquavia Whisonant: [name] Treating Kabao Leite/Extender: Melburn Hake, HOYT Weeks in Treatment: 24 Encounter Discharge Information Items Post Procedure Vitals Discharge Condition: Stable Temperature (F): 98.5 Ambulatory Status: Ambulatory Pulse (bpm): 79 Discharge Destination: Home Respiratory Rate (breaths/min): 16 Transportation: Private Auto Blood Pressure (mmHg): 136/65 Accompanied By: self Schedule Follow-up  Appointment: Yes Clinical Summary of Care: Electronic Signature(s) Signed: 08/29/2019 4:25:26 PM By: Grover Canavan Entered By: Grover Canavan on 08/29/2019 08:57:13 [name] ([id number]) -------------------------------------------------------------------------------- Lower Extremity Assessment Details Patient Name: [name] Date of Service: 08/29/2019 8:30 AM Medical Record Number: [id number] Patient Account Number: [id number] Date of Birth/Sex: 02-28-1965 (54 y.o. M) Treating RN: Cornell Barman Primary Care Lulabelle Desta: [name] Other Clinician: Referring [place]ah Yeats: [name] Treating Dayna Alia/Extender: Sharalyn Ink in Treatment: 24 Vascular Assessment Pulses: Dorsalis Pedis Palpable: [Left:Yes] Electronic Signature(s) Signed: 08/29/2019 4:37:20 PM By: Gretta Cool, BSN, RN, CWS, Kim RN, BSN Entered By: Gretta Cool, BSN, RN, CWS, Kim on 08/29/2019 08:40:55 [name] ([id number]) -------------------------------------------------------------------------------- Multi Wound Chart Details Patient Name: [name]. Date of Service: 08/29/2019 8:30 AM Medical Record Number: [id number] Patient Account Number: [id number] Date of Birth/Sex: [birth year] (54 y.o. M) Treating RN: Grover Canavan Primary Care Talley Kreiser: [name] Other Clinician: Referring Terell Kincy: [name] Treating Shantrell Placzek/Extender: Melburn Hake, HOYT Weeks in Treatment: 24 Vital Signs Height(in): 74 Pulse(bpm): 56 Weight(lbs): 308 Blood Pressure(mmHg): 136/65 Body Mass Index(BMI): 40 Temperature(F): 98.5 Respiratory Rate(breaths/min): 16 Photos: [N/A:N/A] Wound Location: Right, Lateral Foot N/A N/A Wounding Event: Gradually Appeared N/A N/A Primary Etiology: Diabetic Wound/Ulcer of the Lower N/A N/A Extremity Comorbid History: Hypertension, Type II Diabetes, N/A N/A Neuropathy Date Acquired: 03/12/2019 N/A N/A Weeks of Treatment: 24 N/A N/A Wound  Status: Open N/A N/A Pending Amputation on Yes N/A N/A Presentation: Measurements L x W x D (cm) 0.3x0.2x0.2 N/A N/A Area (cm) : 0.047 N/A N/A Volume (cm) : 0.009 N/A N/A % Reduction in Area: 92.60% N/A N/A % Reduction in Volume: 92.90% N/A N/A Starting Position 1 (o'clock): 12 Ending Position 1 (o'clock): 12 Maximum Distance 1 (cm): 0.2 Undermining: Yes N/A N/A Classification: Grade 1 N/A N/A Exudate Amount: Medium N/A N/A Exudate Type: Serous N/A N/A Exudate Color: amber N/A N/A Wound Margin: Flat and Intact N/A N/A Granulation Amount: Large (67-100%) N/A N/A Granulation Quality: Pink N/A N/A Necrotic Amount: Small (1-33%) N/A N/A Exposed Structures: Fat Layer (Subcutaneous Tissue): N/A  N/A Yes Fascia: No Tendon: No Muscle: No Joint: No Bone: No Epithelialization: Small (1-33%) N/A N/A Treatment Notes Electronic Signature(s) GLADE, STRAUSSER ([id number]) Signed: 08/29/2019 4:25:26 PM By: Grover Canavan Entered By: Grover Canavan on 08/29/2019 08:47:44 [name] ([id number]) -------------------------------------------------------------------------------- [place] Details Patient Name: [name]. Date of Service: 08/29/2019 8:30 AM Medical Record Number: [id number] Patient Account Number: [id number] Date of Birth/Sex: [birth year] (54 y.o. M) Treating RN: Grover Canavan Primary Care Dwana Garin: [name] Other Clinician: Referring Keshawna Dix: [name] Treating Athanasios Heldman/Extender: Melburn Hake, HOYT Weeks in Treatment: 24 Active Inactive Nutrition Nursing Diagnoses: Impaired glucose control: actual or potential Goals: Patient/caregiver verbalizes understanding of need to maintain therapeutic glucose control per primary care physician Date Initiated: 03/13/2019 Target Resolution Date: 04/11/2019 Goal Status: Active Interventions: Assess patient nutrition upon admission and as needed per  policy Notes: Wound/Skin Impairment Nursing Diagnoses: Impaired tissue integrity Goals: Ulcer/skin breakdown will have a volume reduction of 30% by week 4 Date Initiated: 03/13/2019 Target Resolution Date: 04/11/2019 Goal Status: Active Interventions: Assess ulceration(s) every visit Notes: Electronic Signature(s) Signed: 08/29/2019 4:25:26 PM By: Grover Canavan Entered By: Grover Canavan on 08/29/2019 08:47:36 [name] ([id number]) -------------------------------------------------------------------------------- Pain Assessment Details Patient Name: [name] Date of Service: 08/29/2019 8:30 AM Medical Record Number: [id number] Patient Account Number: [id number] Date of Birth/Sex: 15-Jan-1965 (54 y.o. M) Treating RN: Grover Canavan Primary Care Terez Freimark: [name] Other Clinician: Referring Hudsyn Barich: [name] Treating Daneli Butkiewicz/Extender: Melburn Hake, HOYT Weeks in Treatment: 24 Active Problems Location of Pain Severity and Description of Pain Patient Has Paino No Site Locations Pain Management and Medication Current Pain Management: Electronic Signature(s) Signed: 08/29/2019 4:25:26 PM By: Grover Canavan Entered By: Grover Canavan on 08/29/2019 08:24:49 [name] ([id number]) -------------------------------------------------------------------------------- Patient/Caregiver Education Details Patient Name: [name] Date of Service: 08/29/2019 8:30 AM Medical Record Number: [id number] Patient Account Number: [id number] Date of Birth/Gender: [birth year] (54 y.o. M) Treating RN: Grover Canavan Primary Care Physician: [name] Other Clinician: Referring Physician: [name] Treating Physician/Extender: Sharalyn Ink in Treatment: 24 Education Assessment Education Provided To: Patient Education Topics Provided Wound/Skin Impairment: Handouts: Caring for Your Ulcer Methods:  Explain/Verbal Responses: State content correctly Electronic Signature(s) Signed: 08/29/2019 4:25:26 PM By: Grover Canavan Entered By: Grover Canavan on 08/29/2019 08:55:53 [name] ([id number]) -------------------------------------------------------------------------------- Wound Assessment Details Patient Name: [name] Date of Service: 08/29/2019 8:30 AM Medical Record Number: [id number] Patient Account Number: [id number] Date of Birth/Sex: [birth year] (54 y.o. M) Treating RN: Cornell Barman Primary Care Maudene Stotler: [name] Other Clinician: Referring Giacomo Valone: [name] Treating Von Quintanar/Extender: Melburn Hake, HOYT Weeks in Treatment: 24 Wound Status Wound Number: 4 Primary Etiology: Diabetic Wound/Ulcer of the Lower Extremity Wound Location: Right, Lateral Foot Wound Status: Open Wounding Event: Gradually Appeared Comorbid History: Hypertension, Type II Diabetes, Neuropathy Date Acquired: 03/12/2019 Weeks Of Treatment: 24 Clustered Wound: No Pending Amputation On Presentation Photos Wound Measurements Length: (cm) 0.3 % Redu Width: (cm) 0.2 % Redu Depth: (cm) 0.2 Epithe Area: (cm) 0.047 Under Volume: (cm) 0.009 St End Max ction in Area: 92.6% ction in Volume: 92.9% lialization: Small (1-33%) mining: Yes arting Position (o'clock): 12 ing Position (o'clock): 12 imum Distance: (cm) 0.2 Wound Description Classification: Grade 1 Foul O Wound Margin: Flat and Intact Slough Exudate Amount: Medium Exudate Type: Serous Exudate Color: amber dor After Cleansing: No /Fibrino Yes Wound Bed Granulation Amount: Large (67-100%) Exposed Structure Granulation Quality: Pink Fascia Exposed: No Necrotic Amount: Small (1-33%) Fat Layer (Subcutaneous Tissue) Exposed:  Yes Necrotic Quality: Adherent Slough Tendon Exposed: No Muscle Exposed: No Joint Exposed: No Bone Exposed: No Treatment Notes Wound #4 (Right, Lateral Foot) PHILEMON, RIEDESEL ([id number]) Notes silver alginate, foam ,TCC right Electronic Signature(s) Signed: 08/29/2019 4:37:20 PM By: Gretta Cool, BSN, RN, CWS, Kim RN, BSN Entered By: Gretta Cool, BSN, RN, CWS, Kim on 08/29/2019 08:40:33 [name] ([id number]) -------------------------------------------------------------------------------- Vitals Details Patient Name: [name] Date of Service: 08/29/2019 8:30 AM Medical Record Number: [id number] Patient Account Number: [id number] Date of Birth/Sex: [birth year] (54 y.o. M) Treating RN: Grover Canavan Primary Care Reeta Kuk: [name] Other Clinician: Referring Claudie Rathbone: [name] Treating Ashwini Jago/Extender: Melburn Hake, HOYT Weeks in Treatment: 24 Vital Signs Time Taken: 08:24 Temperature (F): 98.5 Height (in): 74 Pulse (bpm): 79 Weight (lbs): 308 Respiratory Rate (breaths/min): 16 Body Mass Index (BMI): 39.5 Blood Pressure (mmHg): 136/65 Reference Range: 80 - 120 mg / dl Electronic Signature(s) Signed: 08/29/2019 4:25:26 PM By: Grover Canavan Entered By: Grover Canavan on 08/29/2019 08:24:37

## 2019-09-05 ENCOUNTER — Encounter: Payer: Managed Care, Other (non HMO) | Attending: Physician Assistant | Admitting: Physician Assistant

## 2019-09-05 ENCOUNTER — Other Ambulatory Visit: Payer: Self-pay

## 2019-09-05 DIAGNOSIS — L97512 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d: Secondary | ICD-10-CM | POA: Diagnosis not present

## 2019-09-05 DIAGNOSIS — L03115 Cellulitis of right lower limb: Secondary | ICD-10-CM | POA: Diagnosis not present

## 2019-09-05 DIAGNOSIS — I10 Essential (primary) hypertension: Secondary | ICD-10-CM | POA: Insufficient documentation

## 2019-09-05 DIAGNOSIS — E11621 Type 2 diabetes mellitus with foot ulcer: Secondary | ICD-10-CM | POA: Diagnosis not present

## 2019-09-05 DIAGNOSIS — E785 Hyperlipidemia, unspecified: Secondary | ICD-10-CM | POA: Diagnosis not present

## 2019-09-05 DIAGNOSIS — Z89422 Acquired absence of other left toe(s): Secondary | ICD-10-CM | POA: Insufficient documentation

## 2019-09-05 DIAGNOSIS — L84 Corns and callosities: Secondary | ICD-10-CM | POA: Diagnosis not present

## 2019-09-05 DIAGNOSIS — E1140 Type 2 diabetes mellitus with diabetic neuropathy, unspecified: Secondary | ICD-10-CM | POA: Insufficient documentation

## 2019-09-05 NOTE — Progress Notes (Addendum)
KAYLE, CORREA ([id number]) Visit Report for 09/05/2019 Chief Complaint Document Details Patient Name: [name], [name]. Date of Service: 09/05/2019 10:00 AM Medical Record Number: [id number] Patient Account Number: [id number] Date of Birth/Sex: 10-Nov-1965 (54 y.o. M) Treating RN: Grover Canavan Primary Care Provider: [name] Other Clinician: Referring Provider: [name] Treating Provider/Extender: Melburn Hake, Novaleigh Kohlman Weeks in Treatment: 25 Information Obtained from: Patient Chief Complaint Right lateral foot ulcer Electronic Signature(s) Signed: 09/05/2019 10:30:58 AM By: [name] PA-C Entered By: [name] on 09/05/2019 10:30:58 [name] ([id number]) -------------------------------------------------------------------------------- Debridement Details Patient Name: [name] Date of Service: 09/05/2019 10:00 AM Medical Record Number: [id number] Patient Account Number: [id number] Date of Birth/Sex: 30-Oct-1965 (54 y.o. M) Treating RN: Grover Canavan Primary Care Provider: [name] Other Clinician: Referring Provider: [name] Treating Provider/Extender: Melburn Hake, Kameshia Madruga Weeks in Treatment: 25 Debridement Performed for Wound #4 Right,Lateral Foot Assessment: Performed By: Physician STONE III, Teagyn Fishel E., PA-C Debridement Type: Debridement Severity of Tissue Pre Debridement: Fat layer exposed Level of Consciousness (Pre- Awake and Alert procedure): Pre-procedure Verification/Time Out Yes - 11:00 Taken: Start Time: 11:00 Pain Control: Lidocaine 4% Topical Solution Total Area Debrided (L x W): 3 (cm) x 3.2 (cm) = 9.6 (cm) Tissue and other material Viable, Non-Viable, Slough, Subcutaneous, Skin: Dermis , Skin: Epidermis, Slough debrided: Level: Skin/Subcutaneous Tissue Debridement Description: Excisional Instrument: Curette Bleeding: Minimum Hemostasis Achieved: Pressure End Time: 11:02 Procedural Pain:  0 Post Procedural Pain: 0 Response to Treatment: Procedure was tolerated well Level of Consciousness (Post- Awake and Alert procedure): Post Debridement Measurements of Total Wound Length: (cm) 3 Width: (cm) 3.2 Depth: (cm) 0.2 Volume: (cm) 1.508 Character of Wound/Ulcer Post Debridement: Improved Severity of Tissue Post Debridement: Fat layer exposed Post Procedure Diagnosis Same as Pre-procedure Electronic Signature(s) Signed: 09/05/2019 11:20:09 AM By: [name] PA-C Signed: 09/05/2019 5:11:36 PM By: Grover Canavan Entered By: [name] on 09/05/2019 11:20:08 [name] ([id number]) -------------------------------------------------------------------------------- HPI Details Patient Name: [name] Date of Service: 09/05/2019 10:00 AM Medical Record Number: [id number] Patient Account Number: [id number] Date of Birth/Sex: 12-Jun-1965 (54 y.o. M) Treating RN: Grover Canavan Primary Care Provider: [name] Other Clinician: Referring Provider: [name] Treating Provider/Extender: Melburn Hake, Guerin Lashomb Weeks in Treatment: 25 History of Present Illness HPI Description: This 54 year old male comes with an ulcerated area on the plantar aspect of the right foot which she's had for approximately a month. I have known him from a previous visit at [facility] and was treated in the months of April and May 2016 and rapidly healed a left plantar ulcer with a total contact cast. He has been a diabetic for about 16 years and tries to keep active and is fairly compliant with his diabetes management. 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 Past medical history significant for hypertension, hyperlipidemia, and status post appendectomy 1993. He does not smoke or drink alcohol. 01/14/2015 -- the patient had tolerated his total contact cast very well and had no problems and has had no systemic symptoms. However when his total contact cast was  cut open he had excessive amount of purulent drainage in spite of being on antibiotics. He had had a recent x-ray done in the ER 12/22/2014 which showed IMPRESSION:No evidence of osseous erosion. Known soft tissue ulceration is not well characterized on radiograph. Scattered vascular calcifications seen. his last hemoglobin A1c in December was 7.3. He has been on Augmentin and doxycycline for the last 2 weeks. 01/21/2015 -- his  culture grew rare growth of Pantoea species an MR moderate growth of Candida parapsilosis. it is sensitive to levofloxacin. He has not heard back from the insurance company regarding his hyperbaric oxygen therapy. His MRI has not been done yet and we will try and get him an earlier date 01/28/2015 -- MRI was done last night -- IMPRESSION:1. Soft tissue ulcer overlying the plantar aspect of the fifth metatarsal head extending to the cortex. Subcortical marrow edema in the fifth metatarsal head with corresponding T1 hypointensity is concerning for early osteomyelitis of the plantar lateral aspect of the fifth metatarsal head. Chest x-ray done on 01/14/2015 shows bronchiectatic changes without infiltrate. EKG done on generally 17 2017 shows a normal sinus rhythm and is a normal EKG. 02/04/2015 -- he was asked to see Dr. [name] last week and had 2 appointments but had to cancel both due to pressures of work. Last night he has woken up with severe pain in the foot and leg and it is swollen up. No fever or no change in his blood glucose. Addendum: I spoke with Dr. [name] who kindly agreed to accept the patient for inpatient therapy and have also opened to the hospitalist Dr. [name], and discuss details of the management including PICC line and repeat cultures. 02/12/2015-- -- was seen by Dr. [name] in the hospital and a PICC line was placed. He was to receive Ceftazidime 2 g every 12 hourly, oral levofloxacin 750 mg every 24 hourly and oral fluconazole 200 mg daily. The  antibiotics were to be given for 4 weeks except the Diflucan was to be given for the first 2 weeks. Reviewed note from 02/10/2015 -- and Dr. [name] had recommended management for growth of MSSA and Serratia. He switched him from ceftazidime to ceftriaxone 2 g every 24 hours. Levofloxacin was stopped and he would continue on fluconazole for another week. He had asked me to decide whether further imaging was necessary and whether surgical debridement of the infected bone was needed. He is doing well and has been off work for this week and we will keep him off the next week. 02/22/2015 -- he was seen by my colleague on 01/19/2015 and at that time an incision and drainage was done on his right lateral forefoot on the dorsum. Today when I probed this wound it is frankly draining pus and it communicates with the ulcer on the plantar aspect of his right foot. The patient is still on IV ceftriaxone 2 g every 24 hours and is to be seen by Dr. [name] on Friday. 03/19/2015 -- On 03/04/2015 I spoke to Dr. 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 He recommended operative debridement which would probably result in the fifth metatarsal head and toe amputation.The patient would be referred back to [place] once he was done with surgery. He was admitted to [facility] yesterday and had surgery done by podiatry for a right fifth metatarsal acute osteomyelitis with cellulitis and abscess. He had a right foot incision and drainage with fifth metatarsal partial amputation and removal of toe infected bone and soft tissue with cultures. The wound was partially closed and packing of the distal end was done. Patient was already on cefepime 2 g IV every 8 hourly and put on vancomycin pending final cultures. He had grown moderate gram-negative rods, and  later found to be rare diphtheroids. I received a call from Dr. Cannon Kettle the podiatrist and we discussed the above. On 03/10/2015 he was found positive for influenza a and has been put on Tamiflu. Since his discharge he has been seen by Dr. Cannon Kettle who is planning to remove his sutures this coming week. He was reviewed by Dr. [name] on 03/17/2015 and his Diflucan was stopped and vancomycin. After the 3 doses he is taking. He is going to change Ceftazidime to Zosyn 3.375 g IV every 8 hours. 03/25/2015 -- he was seen by the podiatrist a couple of days ago and the sutures were removed. She will follow back with him in 4 weeks' time and at that time x-rays will be taken and a custom molded insert would be made for his shoe. 04/01/2015 -- he was seen by Dr. [name] on 03/29/2015 who pulled the PICC line stopped his IV antibiotics and recommended starting doxycycline and levofloxacin for 2 weeks. He also stop the fluconazole. The patient will follow up with him only when necessary. Readmission: 03/15/17 on evaluation today patient presents for initial evaluation concerning the new issues although he has previously been evaluated in our clinic. Unfortunately the previous evaluation led to the patient having to proceed to amputation and so he is somewhat nervous about being here [name], [name] ([id number]) today. With that being said he has a very slight blister that occurred on the plantar aspect more medial on the right great toe that has been present for just a very short amount of time, several days. With that being said he felt like initially when he called that he somewhat overreacted but due to the fact that his previous issue led to amputation he is very cautious these days I explained that he did the right thing. With that being said he has been tolerating the dressing changes without complication mainly he just been covering this. He does not have any discomfort in secondary to  neuropathy it's unlikely that he feels much. With that being said he does tell me that the issue that he had here is that he went barefoot when he knows he should not have which subsequently led to the blisters. He states is definitely not doing that anymore. His most recent hemoglobin A1c was December and registered 6.9 his ABI today was 1.1. 03/27/17 on evaluation today patient appears to be doing great in regard to his right great toe ulcer. He has been tolerating the dressing changes without complication. The good news is this is making excellent progress and he seems to be caring for this in an excellent fashion. I see no evidence of breakdown that would make me concerned that he was at risk for infection/amputation. This has obviously been his concern due to the fifth toe amputation that was necessitated previous when he had a similar issue. Nonetheless this seems to be progressing much more nicely. Readmission: 03/13/2019 upon evaluation today patient presents for reevaluation here in the clinic concerning issues he has been having with his right foot on the lateral portion at the proximal end of the metatarsal. Subsequently he tells me that this just opened up in the past couple of days or so and the erythema really began about 24 to 48 hours ago. Fortunately there is no signs of systemic infection. 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 That left him with this bony prominence that is where the wound is currently. There is  erythema surrounding that does have me concerned about cellulitis. With that being said he was noncompressible as far as ABIs are concerned I do think we can need to check into arterial studies at this point. Patient does have a history of hypertension along with diabetes mellitus type 2. He also tends to develop quite a bit of callus white often. 03/20/2019 upon evaluation today patient appears to be doing very well in regard  to his wounds currently. He has been tolerating the dressing changes without complication. Fortunately there is no signs of infection. His ABIs were good at this point and registered at 1.07 on the left and 1.09 on the right. In regard to the x-ray this was negative as well for any signs of osteomyelitis. The patient also seems to be doing better in regard to the infection. He still has several days of the antibiotic left at this point best the Bactrim DS and he seems to be doing very well with this. Overall I am extremely pleased with the progress in 1 week's time he is going require some sharp debridement today however. 03/27/2019 upon evaluation today patient actually appears to be making some progress in regard to his wound. It is a little deeper but measuring smaller which is good news due to the fact that again were clear away some of the necrotic tissue which is why the depth is increasing a little bit. Nonetheless after like were getting very close to being down at a good wound bed. Fortunately there is no signs of active infection at this time. There is a little bit of erythema immediately surrounding the wound that we do want to be very cognizant of and careful about. For that reason I am going to go ahead and extend his antibiotic today for an additional 10 days that is the Bactrim. 04/03/2019 upon evaluation today patient appears to be making some progress. I do feel like the Annitta Needs is doing a good job along with the alginate is being packed into the wound space behind. He has been tolerating the dressing changes without complication. Fortunately there is no signs of active infection at this time. No fevers, chills, nausea, vomiting, or diarrhea. 04/10/2019 upon evaluation today patient appears to be making progress. The wound is not quite as deep but he still does not have a great wound surface yet for working on this and the Santyl does seem to be cleaning things up. Fortunately there is no  evidence of active infection at this time. No fevers, chills, nausea, vomiting, or diarrhea. 04/17/2019 upon evaluation today patient appears to be doing excellent at this time in regard to his foot ulcer. I do believe the Santyl with the alginate packed in behind has been of benefit for him and I am very pleased in this regard. With that being said I am feeling like the wound surface is improving quite a bit each time I see him still I do not believe we are at the point of stating that the wound bed is perfect but again were making progress. The patient is going require some sharp debridement today. 4/22; this is a patient who is using Santyl with calcium alginate. Apparently his wound dimensions have been improving. Patient is changing the dressing himself. He has a surgical shoe. He works in a sitting position therefore is not on his feet all that much. There is undermining 05/01/2019 upon evaluation today patient appears to be doing a little better in regard to the size of his  wound though he still has some depth. This does seem to be much cleaner has been using Santyl on the base of the wound followed by packing with silver alginate and behind. Nonetheless I do believe that based on what we are seeing we may need to switch to a collagen type dressing endoform may actually be excellent for him. 05/08/2019 upon evaluation today patient's wound actually appears to be showing better granulation tissue in the base of the wound. With that being said he does have some epiboly noted around the edges of the wound especially plantar and more distal. Subsequently this is can require sharp debridement today. Fortunately there is no signs of active systemic infection though I do feel like there is some local cellulitis noted at this point. The patient also notes has had increased drainage. 5/13; wound volume quite a bit improved this week down to 3 mm. Patient states pain and drainage better. Culture from last  week grew staph aureus [not MRSA] and this is sensitive to quinolones and he is on Levaquin. HOWEVER he also grew abundant Enterococcus faecalis treatment of choice for this is ampicillin. Quinolone coverage is unreliable 5/20 completing the Augmentin. Small wound on the right lateral foot. Thick rolled edges around the wound. Using silver alginate 05/29/2019 upon evaluation today patient appears to be doing better compared to last time I saw him. Fortunately there is no signs of active infection and I do feel like he is actually making good progress. Overall the quality of the wound bed along with the size looks improved and the inflammation/erythema that was previously noted by myself when I saw the couple weeks back actually has also resolved and this is great. Overall very pleased with how things stand. 06/06/2019 upon evaluation today patient actually appears to be doing quite well with regard to his foot ulcer with regard to some of the granulation were seen at this point. There does not appear to be any evidence of systemic or local infection although he still has discomfort I feel like things are moving in a slow but surely correct direction. I think we may need to help fill some of the space however even though he is using the collagen we may need to have something behind this such as a plain packing strip to try to help hold the collagen in the base of the wound. 06/12/2019 upon evaluation today patient appears to be doing well with regard to his foot ulcer. This seems to be making some slow but steady progress. The wound does not appear to be as deep today compared to where it was previous. Fortunately there is no signs of active infection at this time. No fevers, chills, nausea, vomiting, or diarrhea. 06/19/2019 on evaluation today patient's wound does not appear to be doing quite as well as what I would like to see. Fortunately there is no signs of systemic infection at this time which is  great news. With that being said I do believe that the patient needs to have a wound culture as I do HIAWATHA, MERRIOTT. ([id number]) believe he likely has a local infection that needs to be addressed. He is also can require some debridement today as well. 06/26/2019 upon evaluation today patient's wound actually appears to be showing signs of excellent improvement today. There does not appear to be any evidence of active infection which is great news. There does not appear to be significant erythema at this point. The patient does have improvement overall in the appearance  of the infection he has been taking the Augmentin the culture came back showing no organism was grown at this point but nonetheless I do believe he has been doing well with the antibiotics I would continue that at this point. 07/03/2019 upon evaluation today patient appears to be doing about the same in regard to his wound. There does not appear to be any evidence of active infection and overall I am pleased with where things stand. The patient is tolerating the dressing changes without complication but it does not appear that they are packing the wound at this time which I think is going to slow down his healing prospects. 07/10/2019 upon evaluation today patient appears to be doing little better in my opinion in regard to the overall moisture collected in the base of the wound. They have been packing this with a little bit of silver alginate and that has done quite well. Overall I am pleased in that regard. Subsequently the redness does not appear to be any worse today I did actually go ahead and marked this since this can be 2 weeks between now and when I see him again I would make sure that if anything changes or worsens he will be able to notice this and go ahead and initiate treatment with the Augmentin. 07/22/2019 patient comes in today a little bit early for evaluation secondary to the fact that he was having issues with  increased drainage and concern about infection. With that being said he does appear to have developed a blister area again proximal to where the original wound opening was in although the wound bed itself actually appears to be doing somewhat better he has had increased drainage and tracking of fluid here due to this blister. Nonetheless I believe that that does need to be cleaned away today but again I am not really certain that I see a lot of evidence for infection at this point. 07/29/2019 on evaluation today patient actually appears to be doing better in regard to his foot ulcer. There is improvement compared to last week which is great news. Nonetheless he does come prepared to apply the total contact cast today which I think would be beneficial for him. He knows he is not supposed to drive he did bring his son with him today in order to drive him. 07/31/2019 upon evaluation today patient appears to be doing well with regard to his wound today. I do not see any signs of infection I do believe that he is definitely showing some signs of improvement here in the office in a couple days as far as the overall erythema and pressure getting to the wound area is concerned. With that being said I do believe that the cast has been of great benefit for him. 08/08/2019 upon evaluation today patient appears to be doing better in regard to his wound. Overall I feel like he is showing signs of improvement. The external measurement is really not bad at all although I did remove some of the callus in order to open up the wound itself the depth is actually coming quite well and I am extremely pleased in that regard I do not think we can to see much improvement externally until we get the internal depth filled in and that again is improved. Fortunately there is no signs of active infection at this time. No fevers, chills, nausea, vomiting, or diarrhea. 08/15/2019 on evaluation today patient actually appears to be doing  well with regard to his foot  ulcer. This is showing some signs of minimal improvement from the standpoint of the depth and appearance of the wound as well as the edges of the wound. It is taking its time and to be honest I do believe that he seems to be progressing well. I am happy with the cast and what it is achieving at this point. Have contemplated checking into Dermagraft for Apligraf for the patient but again he does seem to be making fairly good progress in my opinion. If it has not tremendously changed come next week I may consider one of the 2. 08/22/2019 on evaluation today patient appears to be making good progress with regard to his wound I am still very pleased with where things stand today. Overall I think that he is making good improvements week by week and I believe that we are getting continue with the Total contact cast I will likely switch to a silver alginate dressing. 08/29/2019 on evaluation today patient appears to be doing quite well with regard to his wound. Fortunately this is measuring quite small. There is no signs of active infection at this time although he did have a little bit of a callus area just to the side of his foot where we previously noted some fluid collecting underneath that was the case today as well. I did have to remove this. With that being said overall he seems to be doing quite well. 09/05/19 on evaluation today patient appears to be doing very well with regard to his original wound which is actually very small on evaluation today. With that being said he does have a blistered area that's more proximal to where this wound is that has been a little bit more concerned. Fortunately there is no signs of active infection at this time. I'm going to need to remove the blister tissue today. Electronic Signature(s) Signed: 09/06/2019 9:24:37 AM By: [name] PA-C Entered By: [name] on 09/06/2019 09:24:32 [name]  ([id number]) -------------------------------------------------------------------------------- Physical Exam Details Patient Name: [name], [name] Date of Service: 09/05/2019 10:00 AM Medical Record Number: [id number] Patient Account Number: [id number] Date of Birth/Sex: 1966/01/01 (54 y.o. M) Treating RN: Grover Canavan Primary Care Provider: [name] Other Clinician: Referring Provider: [name] Treating Provider/Extender: Melburn Hake, Keyaira Clapham Weeks in Treatment: 29 Constitutional Well-nourished and well-hydrated in no acute distress. Respiratory normal breathing without difficulty. Psychiatric this patient is able to make decisions and demonstrates good insight into disease process. Alert and Oriented x 3. pleasant and cooperative. Notes very pleased with the fact that the patient seems to be doing better in regard to the original wound which is very small this point. With that being said I am more concerned about the fact that unfortunately he is having some issues with blistering proximal to this location which is somewhat of a problem here. I think there will need to clear this way which I did today and then subsequently though there was some new skin underneath still concerned about minimal amount of infection here. I am going to go ahead and start him on gentamicin cream and we will subsequently see how this does, probably avoid the cast for this week. Electronic Signature(s) Signed: 09/06/2019 9:25:51 AM By: [name] PA-C Entered By: [name] on 09/06/2019 09:25:51 [name] ([id number]) -------------------------------------------------------------------------------- Physician Orders Details Patient Name: [name] Date of Service: 09/05/2019 10:00 AM Medical Record Number: [id number] Patient Account Number: [id number] Date of Birth/Sex: 1965-09-30 (54 y.o. 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 - in office o Dial antibacterial soap, wash wounds, rinse and pat dry prior to dressing wounds Anesthetic (add to Medication List) Wound #4 Right,Lateral Foot o Topical Lidocaine 4% cream applied to wound bed prior to debridement (In Clinic Only). Primary Wound Dressing Wound #4 Right,Lateral Foot o Silver Alginate - Thin layer of gentamicin under the alginate Secondary Dressing Wound #4 Right,Lateral Foot o Conform/Kerlix - Offloading felt donut, dry gauze, roll gauze to cover Dressing Change Frequency Wound #4 Right,Lateral Foot o Change Dressing Monday, Wednesday, Friday Follow-up Appointments Wound #4 Right,Lateral Foot o Return Appointment in 1 week. Off-Loading Wound #4 Right,Lateral Foot o Open toe surgical shoe with peg assist. Patient Medications Allergies: No Known Allergies Notifications Medication Indication Start End gentamicin 09/05/2019 DOSE topical 0.1 % cream - cream topical applied in a thin film to the wound bed with each dressing change as directed [name], [name] ([id number]) Electronic Signature(s) Signed: 09/06/2019 9:32:51 AM By: [name] PA-C Previous Signature: 09/05/2019 11:17:39 AM Version By: [name] PA-C Entered By: [name] on 09/06/2019 09:32:50 [name]  ([id number]) -------------------------------------------------------------------------------- Problem List Details Patient Name: [name] Date of Service: 09/05/2019 10:00 AM Medical Record Number: [id number] Patient Account Number: [id number] Date of Birth/Sex: 1965/09/12 (54 y.o. 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 ulcer of other part of right foot with fat layer 03/13/2019 No Yes exposed L03.115 Cellulitis of right lower limb 05/15/2019 No Yes L84 Corns and callosities 03/13/2019 No Yes I10 Essential (primary) hypertension 03/13/2019 No Yes 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 M) Treating RN: Grover Canavan Primary Care Provider: [name] Other Clinician: Referring Provider: [name] Treating Provider/Extender: Melburn Hake, Selisa Tensley Weeks in Treatment: 25 Subjective Chief Complaint Information obtained from Patient Right lateral foot ulcer History of Present Illness (HPI) This 54 year old male comes with an ulcerated area on the plantar aspect of the right foot which she's had for approximately a month. I have known him from a  previous visit at [facility] wound center and was treated in the months of April and May 2016 and rapidly healed a left plantar ulcer with a total contact cast. He has been a diabetic for about 16 years and tries to keep active and is fairly compliant with his diabetes management. 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 Past medical history significant for hypertension, hyperlipidemia, and status post appendectomy  1993. He does not smoke or drink alcohol. 01/14/2015 -- the patient had tolerated his total contact cast very well and had no problems and has had no systemic symptoms. However when his total contact cast was cut open he had excessive amount of purulent drainage in spite of being on antibiotics. He had had a recent x-ray done in the ER 12/22/2014 which showed IMPRESSION:No evidence of osseous erosion. Known soft tissue ulceration is not well characterized on radiograph. Scattered vascular calcifications seen. his last hemoglobin A1c in December was 7.3. He has been on Augmentin and doxycycline for the last 2 weeks. 01/21/2015 -- his culture grew rare growth of Pantoea species an MR moderate growth of Candida parapsilosis. it is sensitive to levofloxacin. He has not heard back from the insurance company regarding his hyperbaric oxygen therapy. His MRI has not been done yet and we will try and get him an earlier date 01/28/2015 -- MRI was done last night -- IMPRESSION:1. Soft tissue ulcer overlying the plantar aspect of the fifth metatarsal head extending to the cortex. Subcortical marrow edema in the fifth metatarsal head with corresponding T1 hypointensity is concerning for early osteomyelitis of the plantar lateral aspect of the fifth metatarsal head. Chest x-ray done on 01/14/2015 shows bronchiectatic changes without infiltrate. EKG done on generally 17 2017 shows a normal sinus rhythm and is a normal EKG. 02/04/2015 -- he was asked to see Dr. [name] last week and had 2  appointments but had to cancel both due to pressures of work. Last night he has woken up with severe pain in the foot and leg and it is swollen up. No fever or no change in his blood glucose. Addendum: I spoke with Dr. [name] who kindly agreed to accept the patient for inpatient therapy and have also opened to the hospitalist Dr. [name], and discuss details of the management including PICC line and repeat cultures. 02/12/2015-- -- was seen by Dr. [name] in the hospital and a PICC line was placed. He was to receive Ceftazidime 2 g every 12 hourly, oral levofloxacin 750 mg every 24 hourly and oral fluconazole 200 mg daily. The antibiotics were to be given for 4 weeks except the Diflucan was to be given for the first 2 weeks. Reviewed note from 02/10/2015 -- and Dr. [name] had recommended management for growth of MSSA and Serratia. He switched him from ceftazidime to ceftriaxone 2 g every 24 hours. Levofloxacin was stopped and he would continue on fluconazole for another week. He had asked me to decide whether further imaging was necessary and whether surgical debridement of the infected bone was needed. He is doing well and has been off work for this week and we will keep him off the next week. 02/22/2015 -- he was seen by my colleague on 01/19/2015 and at that time an incision and drainage was done on his right lateral forefoot on the dorsum. Today when I probed this wound it is frankly draining pus and it communicates with the ulcer on the plantar aspect of his right foot. The patient is still on IV ceftriaxone 2 g every 24 hours and is to be seen by Dr. [name] on Friday. 03/19/2015 -- On 03/04/2015 I spoke to Dr. 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 He recommended operative debridement which would probably result in  the  fifth metatarsal head and toe amputation.The patient would be referred back to [place] once he was done with surgery. He was admitted to [facility] yesterday and had surgery done by podiatry for a right fifth metatarsal acute osteomyelitis with cellulitis and abscess. He had a right foot incision and drainage with fifth metatarsal partial amputation and removal of toe infected bone and soft tissue with cultures. The wound was partially closed and packing of the distal end was done. Patient was already on cefepime 2 g IV every 8 hourly and put on vancomycin pending final cultures. He had grown moderate gram-negative rods, and later found to be rare diphtheroids. I received a call from Dr. Cannon Kettle the podiatrist and we discussed the above. On 03/10/2015 he was found positive for influenza a and has been put on Tamiflu. Since his discharge he has been seen by Dr. Cannon Kettle who is planning to remove his sutures this coming week. He was reviewed by Dr. [name] on 03/17/2015 and his Diflucan was stopped and vancomycin. After the 3 doses he is taking. He is going to change Ceftazidime to Zosyn 3.375 g IV every 8 hours. 03/25/2015 -- he was seen by the podiatrist a couple of days ago and the sutures were removed. She will follow back with him in 4 weeks' time and at that time x-rays will be taken and a custom molded insert would be made for his shoe. 04/01/2015 -- he was seen by Dr. [name] on 03/29/2015 who pulled the PICC line stopped his IV antibiotics and recommended starting doxycycline and levofloxacin for 2 weeks. He also stop the fluconazole. The patient will follow up with him only when necessary. [name], [name] ([id number]) Readmission: 03/15/17 on evaluation today patient presents for initial evaluation concerning the new issues although he has previously been evaluated in our clinic. Unfortunately the previous evaluation led to the patient having to proceed to amputation and so he  is somewhat nervous about being here today. With that being said he has a very slight blister that occurred on the plantar aspect more medial on the right great toe that has been present for just a very short amount of time, several days. With that being said he felt like initially when he called that he somewhat overreacted but due to the fact that his previous issue led to amputation he is very cautious these days I explained that he did the right thing. With that being said he has been tolerating the dressing changes without complication mainly he just been covering this. He does not have any discomfort in secondary to neuropathy it's unlikely that he feels much. With that being said he does tell me that the issue that he had here is that he went barefoot when he knows he should not have which subsequently led to the blisters. He states is definitely not doing that anymore. His most recent hemoglobin A1c was December and registered 6.9 his ABI today was 1.1. 03/27/17 on evaluation today patient appears to be doing great in regard to his right great toe ulcer. He has been tolerating the dressing changes without complication. The good news is this is making excellent progress and he seems to be caring for this in an excellent fashion. I see no evidence of breakdown that would make me concerned that he was at risk for infection/amputation. This has obviously been his concern due to the fifth toe amputation that was necessitated previous when he had a similar issue. Nonetheless this seems  to be progressing much more nicely. Readmission: 03/13/2019 upon evaluation today patient presents for reevaluation here in the clinic concerning issues he has been having with his right foot on the lateral portion at the proximal end of the metatarsal. Subsequently he tells me that this just opened up in the past couple of days or so and the erythema really began about 24 to 48 hours ago. Fortunately there is no signs  of systemic infection. 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 That left him with this bony prominence that is where the wound is currently. There is erythema surrounding that does have me concerned about cellulitis. With that being said he was noncompressible as far as ABIs are concerned I do think we can need to check into arterial studies at this point. Patient does have a history of hypertension along with diabetes mellitus type 2. He also tends to develop quite a bit of callus white often. 03/20/2019 upon evaluation today patient appears to be doing very well in regard to his wounds currently. He has been tolerating the dressing changes without complication. Fortunately there is no signs of infection. His ABIs were good at this point and registered at 1.07 on the left and 1.09 on the right. In regard to the x-ray this was negative as well for any signs of osteomyelitis. The patient also seems to be doing better in regard to the infection. He still has several days of the antibiotic left at this point best the Bactrim DS and he seems to be doing very well with this. Overall I am extremely pleased with the progress in 1 week's time he is going require some sharp debridement today however. 03/27/2019 upon evaluation today patient actually appears to be making some progress in regard to his wound. It is a little deeper but measuring smaller which is good news due to the fact that again were clear away some of the necrotic tissue which is why the depth is increasing a little bit. Nonetheless after like were getting very close to being down at a good wound bed. Fortunately there is no signs of active infection at this time. There is a little bit of erythema immediately surrounding the wound that we do want to be very cognizant of and careful about. For that reason I am going to go ahead and extend his antibiotic today for an additional 10 days that is the  Bactrim. 04/03/2019 upon evaluation today patient appears to be making some progress. I do feel like the Annitta Needs is doing a good job along with the alginate is being packed into the wound space behind. He has been tolerating the dressing changes without complication. Fortunately there is no signs of active infection at this time. No fevers, chills, nausea, vomiting, or diarrhea. 04/10/2019 upon evaluation today patient appears to be making progress. The wound is not quite as deep but he still does not have a great wound surface yet for working on this and the Santyl does seem to be cleaning things up. Fortunately there is no evidence of active infection at this time. No fevers, chills, nausea, vomiting, or diarrhea. 04/17/2019 upon evaluation today patient appears to be doing excellent at this time in regard to his foot ulcer. I do believe the Santyl with the alginate packed in behind has been of benefit for him and I am very pleased in this regard. With that being said I am feeling like the wound surface is  improving quite a bit each time I see him still I do not believe we are at the point of stating that the wound bed is perfect but again were making progress. The patient is going require some sharp debridement today. 4/22; this is a patient who is using Santyl with calcium alginate. Apparently his wound dimensions have been improving. Patient is changing the dressing himself. He has a surgical shoe. He works in a sitting position therefore is not on his feet all that much. There is undermining 05/01/2019 upon evaluation today patient appears to be doing a little better in regard to the size of his wound though he still has some depth. This does seem to be much cleaner has been using Santyl on the base of the wound followed by packing with silver alginate and behind. Nonetheless I do believe that based on what we are seeing we may need to switch to a collagen type dressing endoform may actually be  excellent for him. 05/08/2019 upon evaluation today patient's wound actually appears to be showing better granulation tissue in the base of the wound. With that being said he does have some epiboly noted around the edges of the wound especially plantar and more distal. Subsequently this is can require sharp debridement today. Fortunately there is no signs of active systemic infection though I do feel like there is some local cellulitis noted at this point. The patient also notes has had increased drainage. 5/13; wound volume quite a bit improved this week down to 3 mm. Patient states pain and drainage better. Culture from last week grew staph aureus [not MRSA] and this is sensitive to quinolones and he is on Levaquin. HOWEVER he also grew abundant Enterococcus faecalis treatment of choice for this is ampicillin. Quinolone coverage is unreliable 5/20 completing the Augmentin. Small wound on the right lateral foot. Thick rolled edges around the wound. Using silver alginate 05/29/2019 upon evaluation today patient appears to be doing better compared to last time I saw him. Fortunately there is no signs of active infection and I do feel like he is actually making good progress. Overall the quality of the wound bed along with the size looks improved and the inflammation/erythema that was previously noted by myself when I saw the couple weeks back actually has also resolved and this is great. Overall very pleased with how things stand. 06/06/2019 upon evaluation today patient actually appears to be doing quite well with regard to his foot ulcer with regard to some of the granulation were seen at this point. There does not appear to be any evidence of systemic or local infection although he still has discomfort I feel like things are moving in a slow but surely correct direction. I think we may need to help fill some of the space however even though he is using the collagen we may need to have something behind  this such as a plain packing strip to try to help hold the collagen in the base of the wound. 06/12/2019 upon evaluation today patient appears to be doing well with regard to his foot ulcer. This seems to be making some slow but steady progress. The wound does not appear to be as deep today compared to where it was previous. Fortunately there is no signs of active infection at [name], [name]. ([id number]) this time. No fevers, chills, nausea, vomiting, or diarrhea. 06/19/2019 on evaluation today patient's wound does not appear to be doing quite as well as what I would like to  see. Fortunately there is no signs of systemic infection at this time which is great news. With that being said I do believe that the patient needs to have a wound culture as I do believe he likely has a local infection that needs to be addressed. He is also can require some debridement today as well. 06/26/2019 upon evaluation today patient's wound actually appears to be showing signs of excellent improvement today. There does not appear to be any evidence of active infection which is great news. There does not appear to be significant erythema at this point. The patient does have improvement overall in the appearance of the infection he has been taking the Augmentin the culture came back showing no organism was grown at this point but nonetheless I do believe he has been doing well with the antibiotics I would continue that at this point. 07/03/2019 upon evaluation today patient appears to be doing about the same in regard to his wound. There does not appear to be any evidence of active infection and overall I am pleased with where things stand. The patient is tolerating the dressing changes without complication but it does not appear that they are packing the wound at this time which I think is going to slow down his healing prospects. 07/10/2019 upon evaluation today patient appears to be doing little better in my opinion in  regard to the overall moisture collected in the base of the wound. They have been packing this with a little bit of silver alginate and that has done quite well. Overall I am pleased in that regard. Subsequently the redness does not appear to be any worse today I did actually go ahead and marked this since this can be 2 weeks between now and when I see him again I would make sure that if anything changes or worsens he will be able to notice this and go ahead and initiate treatment with the Augmentin. 07/22/2019 patient comes in today a little bit early for evaluation secondary to the fact that he was having issues with increased drainage and concern about infection. With that being said he does appear to have developed a blister area again proximal to where the original wound opening was in although the wound bed itself actually appears to be doing somewhat better he has had increased drainage and tracking of fluid here due to this blister. Nonetheless I believe that that does need to be cleaned away today but again I am not really certain that I see a lot of evidence for infection at this point. 07/29/2019 on evaluation today patient actually appears to be doing better in regard to his foot ulcer. There is improvement compared to last week which is great news. Nonetheless he does come prepared to apply the total contact cast today which I think would be beneficial for him. He knows he is not supposed to drive he did bring his son with him today in order to drive him. 07/31/2019 upon evaluation today patient appears to be doing well with regard to his wound today. I do not see any signs of infection I do believe that he is definitely showing some signs of improvement here in the office in a couple days as far as the overall erythema and pressure getting to the wound area is concerned. With that being said I do believe that the cast has been of great benefit for him. 08/08/2019 upon evaluation today  patient appears to be doing better in regard to his wound.  Overall I feel like he is showing signs of improvement. The external measurement is really not bad at all although I did remove some of the callus in order to open up the wound itself the depth is actually coming quite well and I am extremely pleased in that regard I do not think we can to see much improvement externally until we get the internal depth filled in and that again is improved. Fortunately there is no signs of active infection at this time. No fevers, chills, nausea, vomiting, or diarrhea. 08/15/2019 on evaluation today patient actually appears to be doing well with regard to his foot ulcer. This is showing some signs of minimal improvement from the standpoint of the depth and appearance of the wound as well as the edges of the wound. It is taking its time and to be honest I do believe that he seems to be progressing well. I am happy with the cast and what it is achieving at this point. Have contemplated checking into Dermagraft for Apligraf for the patient but again he does seem to be making fairly good progress in my opinion. If it has not tremendously changed come next week I may consider one of the 2. 08/22/2019 on evaluation today patient appears to be making good progress with regard to his wound I am still very pleased with where things stand today. Overall I think that he is making good improvements week by week and I believe that we are getting continue with the Total contact cast I will likely switch to a silver alginate dressing. 08/29/2019 on evaluation today patient appears to be doing quite well with regard to his wound. Fortunately this is measuring quite small. There is no signs of active infection at this time although he did have a little bit of a callus area just to the side of his foot where we previously noted some fluid collecting underneath that was the case today as well. I did have to remove this. With that  being said overall he seems to be doing quite well. 09/05/19 on evaluation today patient appears to be doing very well with regard to his original wound which is actually very small on evaluation today. With that being said he does have a blistered area that's more proximal to where this wound is that has been a little bit more concerned. Fortunately there is no signs of active infection at this time. I'm going to need to remove the blister tissue today. Objective Constitutional Well-nourished and well-hydrated in no acute distress. Vitals Time Taken: 10:00 AM, Height: 74 in, Weight: 308 lbs, BMI: 39.5, Temperature: 98.6 F, Pulse: 79 bpm, Respiratory Rate: 20 breaths/min, Blood Pressure: 162/68 mmHg. Respiratory [name], [name] ([id number]) normal breathing without difficulty. Psychiatric this patient is able to make decisions and demonstrates good insight into disease process. Alert and Oriented x 3. pleasant and cooperative. General Notes: very pleased with the fact that the patient seems to be doing better in regard to the original wound which is very small this point. With that being said I am more concerned about the fact that unfortunately he is having some issues with blistering proximal to this location which is somewhat of a problem here. I think there will need to clear this way which I did today and then subsequently though there was some new skin underneath still concerned about minimal amount of infection here. I am going to go ahead and start him on gentamicin cream and we will subsequently see how  this does, probably avoid the cast for this week. Integumentary (Hair, Skin) Wound #4 status is Open. Original cause of wound was Gradually Appeared. The wound is located on the Right,Lateral Foot. The wound measures 0.3cm length x 0.3cm width x 0.2cm depth; 0.071cm^2 area and 0.014cm^3 volume. There is Fat Layer (Subcutaneous Tissue) exposed. There is a medium amount of serous  drainage noted. The wound margin is flat and intact. There is large (67-100%) pink granulation within the wound bed. There is a small (1-33%) amount of necrotic tissue within the wound bed including Adherent Slough. Assessment Active Problems ICD-10 Type 2 diabetes mellitus with foot ulcer 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d Cellulitis of right lower limb Corns and callosities Essential (primary) hypertension Acquired absence of other left toe(s) Procedures Wound #4 Pre-procedure diagnosis of Wound #4 is a Diabetic Wound/Ulcer of the Lower Extremity located on the Right,Lateral Foot .Severity of Tissue Pre Debridement is: Fat layer exposed. There was a Excisional Skin/Subcutaneous Tissue Debridement with a total area of 9.6 sq cm performed by STONE III, Drusilla Wampole E., PA-C. With the following instrument(s): Curette to remove Viable and Non-Viable tissue/material. Material removed includes Subcutaneous Tissue, Slough, Skin: Dermis, and Skin: Epidermis after achieving pain control using Lidocaine 4% Topical Solution. No specimens were taken. A time out was conducted at 11:00, prior to the start of the procedure. A Minimum amount of bleeding was controlled with Pressure. The procedure was tolerated well with a pain level of 0 throughout and a pain level of 0 following the procedure. Post Debridement Measurements: 3cm length x 3.2cm width x 0.2cm depth; 1.508cm^3 volume. Character of Wound/Ulcer Post Debridement is improved. Severity of Tissue Post Debridement is: Fat layer exposed. Post procedure Diagnosis Wound #4: Same as Pre-Procedure Plan Wound Cleansing: Wound #4 Right,Lateral Foot: Clean wound with Normal Saline. - in office Dial antibacterial soap, wash wounds, rinse and pat dry prior to dressing wounds Anesthetic (add to Medication List): Wound #4 Right,Lateral Foot: Topical Lidocaine 4% cream applied to wound bed prior to debridement (In Clinic  Only). Primary Wound Dressing: Wound #4 Right,Lateral Foot: Silver Alginate - Thin layer of gentamicin under the alginate Secondary Dressing: Wound #4 Right,Lateral Foot: Conform/Kerlix - Offloading felt donut, dry gauze, roll gauze to cover Dressing Change Frequency: Wound #4 Right,Lateral Foot: Change Dressing Monday, Wednesday, Friday Follow-up Appointments: [name], [name] ([id number]) Wound #4 Right,Lateral Foot: Return Appointment in 1 week. Off-Loading: Wound #4 Right,Lateral Foot: Open toe surgical shoe with peg assist. The following medication(s) was prescribed: gentamicin topical 0.1 % cream cream topical applied in a thin film to the wound bed with each dressing change as directed starting 09/05/2019 1. I'm to recommend at this time that we go ahead and have the patient continue with the silver alginate dressing which I think is still best way to go here. I am going to use a little bit of gentamicin and thin film underneath. #2 him also to recommend that we continue with the offloading felt though not followed by dry gauze and rolled gauze to cover. #3 over also recommended the patient needs to try to as much as possible staff of his foot to avoid any pressure hopefully have in the open toe surgical shoe with Pegasys will be of benefit for him in this regard while we allow these areas to silica and healed. We will see the patient back for a follow-up visit next week. Electronic Signature(s) Signed: 09/06/2019 9:34:08 AM By: Melburn Hake,  Rindy Kollman PA-C Entered By: [name] on 09/06/2019 09:34:08

## 2019-09-12 ENCOUNTER — Encounter: Payer: Managed Care, Other (non HMO) | Admitting: Physician Assistant

## 2019-09-12 ENCOUNTER — Other Ambulatory Visit: Payer: Self-pay

## 2019-09-12 DIAGNOSIS — E11621 Type 2 diabetes mellitus with foot ulcer: Secondary | ICD-10-CM | POA: Diagnosis not present

## 2019-09-12 NOTE — Progress Notes (Addendum)
DAHMIR, EPPERLY ([id number]) Visit Report for 09/12/2019 Chief Complaint Document Details Patient Name: [name], [name]. Date of Service: 09/12/2019 8:15 AM Medical Record Number: [id number] Patient Account Number: [id number] Date of Birth/Sex: Sep 20, 1965 (54 y.o. M) Treating RN: Cornell Barman Primary Care Provider: [name] Other Clinician: Referring Provider: [name] Treating Provider/Extender: Melburn Hake, HOYT Weeks in Treatment: 26 Information Obtained from: Patient Chief Complaint Right lateral foot ulcer Electronic Signature(s) Signed: 09/12/2019 8:27:12 AM By: [name] PA-C Entered By: [name] on 09/12/2019 08:27:12 [name] ([id number]) -------------------------------------------------------------------------------- Debridement Details Patient Name: [name] Date of Service: 09/12/2019 8:15 AM Medical Record Number: [id number] Patient Account Number: [id number] Date of Birth/Sex: 01-16-1965 (54 y.o. M) Treating RN: Grover Canavan Primary Care Provider: [name] Other Clinician: Referring Provider: [name] Treating Provider/Extender: Melburn Hake, HOYT Weeks in Treatment: 26 Debridement Performed for Wound #4 Right,Lateral Foot Assessment: Performed By: Physician STONE III, HOYT E., PA-C Debridement Type: Debridement Severity of Tissue Pre Debridement: Fat layer exposed Level of Consciousness (Pre- Awake and Alert procedure): Pre-procedure Verification/Time Out Yes - 08:51 Taken: Start Time: 08:52 Pain Control: Lidocaine Total Area Debrided (L x W): 0.4 (cm) x 0.4 (cm) = 0.16 (cm) Tissue and other material Callus, Slough, Subcutaneous, Slough debrided: Level: Skin/Subcutaneous Tissue Debridement Description: Excisional Instrument: Curette Bleeding: Minimum Hemostasis Achieved: Pressure Procedural Pain: 0 Post Procedural Pain: 0 Response to Treatment: Procedure was tolerated well  Level of Consciousness (Post- Awake and Alert procedure): Post Debridement Measurements of Total Wound Length: (cm) 0.4 Width: (cm) 0.4 Depth: (cm) 0.2 Volume: (cm) 0.025 Character of Wound/Ulcer Post Debridement: Improved Severity of Tissue Post Debridement: Fat layer exposed Post Procedure Diagnosis Same as Pre-procedure Electronic Signature(s) Signed: 09/12/2019 3:44:42 PM By: Grover Canavan Signed: 09/12/2019 4:38:18 PM By: [name] PA-C Entered By: Grover Canavan on 09/12/2019 08:55:17 [name] ([id number]) -------------------------------------------------------------------------------- HPI Details Patient Name: [name] Date of Service: 09/12/2019 8:15 AM Medical Record Number: [id number] Patient Account Number: [id number] Date of Birth/Sex: 1965/12/15 (54 y.o. M) Treating RN: Cornell Barman Primary Care Provider: [name] Other Clinician: Referring Provider: [name] Treating Provider/Extender: Melburn Hake, HOYT Weeks in Treatment: 26 History of Present Illness HPI Description: This 54 year old male comes with an ulcerated area on the plantar aspect of the right foot which she's had for approximately a month. I have known him from a previous visit at [facility] wound center and was treated in the months of April and May 2016 and rapidly healed a left plantar ulcer with a total contact cast. He has been a diabetic for about 16 years and tries to keep active and is fairly compliant with his diabetes management. 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 Past medical history significant for hypertension, hyperlipidemia, and status post appendectomy 1993. He does not smoke or drink alcohol. 01/14/2015 -- the patient had tolerated his total contact cast very well and had no problems and has had no systemic symptoms. However when his total contact cast was cut open he had excessive amount of purulent drainage in spite of being on  antibiotics. He had had a recent x-ray done in the ER 12/22/2014 which showed IMPRESSION:No evidence of osseous erosion. Known soft tissue ulceration is not well characterized on radiograph. Scattered vascular calcifications seen. his last hemoglobin A1c in December was 7.3. He has been on Augmentin and doxycycline for the last 2 weeks. 01/21/2015 -- his culture grew rare growth of Pantoea species an MR moderate growth of Candida  parapsilosis. it is sensitive to levofloxacin. He has not heard back from the insurance company regarding his hyperbaric oxygen therapy. His MRI has not been done yet and we will try and get him an earlier date 01/28/2015 -- MRI was done last night -- IMPRESSION:1. Soft tissue ulcer overlying the plantar aspect of the fifth metatarsal head extending to the cortex. Subcortical marrow edema in the fifth metatarsal head with corresponding T1 hypointensity is concerning for early osteomyelitis of the plantar lateral aspect of the fifth metatarsal head. Chest x-ray done on 01/14/2015 shows bronchiectatic changes without infiltrate. EKG done on generally 17 2017 shows a normal sinus rhythm and is a normal EKG. 02/04/2015 -- he was asked to see Dr. [name] last week and had 2 appointments but had to cancel both due to pressures of work. Last night he has woken up with severe pain in the foot and leg and it is swollen up. No fever or no change in his blood glucose. Addendum: I spoke with Dr. [name] who kindly agreed to accept the patient for inpatient therapy and have also opened to the hospitalist Dr. [name], and discuss details of the management including PICC line and repeat cultures. 02/12/2015-- -- was seen by Dr. [name] in the hospital and a PICC line was placed. He was to receive Ceftazidime 2 g every 12 hourly, oral levofloxacin 750 mg every 24 hourly and oral fluconazole 200 mg daily. The antibiotics were to be given for 4 weeks except the Diflucan was to be  given for the first 2 weeks. Reviewed note from 02/10/2015 -- and Dr. [name] had recommended management for growth of MSSA and Serratia. He switched him from ceftazidime to ceftriaxone 2 g every 24 hours. Levofloxacin was stopped and he would continue on fluconazole for another week. He had asked me to decide whether further imaging was necessary and whether surgical debridement of the infected bone was needed. He is doing well and has been off work for this week and we will keep him off the next week. 02/22/2015 -- he was seen by my colleague on 01/19/2015 and at that time an incision and drainage was done on his right lateral forefoot on the dorsum. Today when I probed this wound it is frankly draining pus and it communicates with the ulcer on the plantar aspect of his right foot. The patient is still on IV ceftriaxone 2 g every 24 hours and is to be seen by Dr. [name] on Friday. 03/19/2015 -- On 03/04/2015 I spoke to Dr. 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 He recommended operative debridement which would probably result in the fifth metatarsal head and toe amputation.The patient would be referred back to [place] once he was done with surgery. He was admitted to [facility] yesterday and had surgery done by podiatry for a right fifth metatarsal acute osteomyelitis with cellulitis and abscess. He had a right foot incision and drainage with fifth metatarsal partial amputation and removal of toe infected bone and soft tissue with cultures. The wound was partially closed and packing of the distal end was done. Patient was already on cefepime 2 g IV every 8 hourly and put on vancomycin pending final cultures. He had grown moderate gram-negative rods, and later found to be rare diphtheroids. I received a call from Dr. Cannon Kettle  the podiatrist and we discussed the above. On 03/10/2015 he was found positive for influenza a and has been put on Tamiflu. Since his discharge he has been seen by Dr. Cannon Kettle who is planning to remove his sutures this coming week. He was reviewed by Dr. [name] on 03/17/2015 and his Diflucan was stopped and vancomycin. After the 3 doses he is taking. He is going to change Ceftazidime to Zosyn 3.375 g IV every 8 hours. 03/25/2015 -- he was seen by the podiatrist a couple of days ago and the sutures were removed. She will follow back with him in 4 weeks' time and at that time x-rays will be taken and a custom molded insert would be made for his shoe. 04/01/2015 -- he was seen by Dr. [name] on 03/29/2015 who pulled the PICC line stopped his IV antibiotics and recommended starting doxycycline and levofloxacin for 2 weeks. He also stop the fluconazole. The patient will follow up with him only when necessary. Readmission: 03/15/17 on evaluation today patient presents for initial evaluation concerning the new issues although he has previously been evaluated in our clinic. Unfortunately the previous evaluation led to the patient having to proceed to amputation and so he is somewhat nervous about being here [name], [name] ([id number]) today. With that being said he has a very slight blister that occurred on the plantar aspect more medial on the right great toe that has been present for just a very short amount of time, several days. With that being said he felt like initially when he called that he somewhat overreacted but due to the fact that his previous issue led to amputation he is very cautious these days I explained that he did the right thing. With that being said he has been tolerating the dressing changes without complication mainly he just been covering this. He does not have any discomfort in secondary to neuropathy it's unlikely that he feels much. With that being said he does tell me  that the issue that he had here is that he went barefoot when he knows he should not have which subsequently led to the blisters. He states is definitely not doing that anymore. His most recent hemoglobin A1c was December and registered 6.9 his ABI today was 1.1. 03/27/17 on evaluation today patient appears to be doing great in regard to his right great toe ulcer. He has been tolerating the dressing changes without complication. The good news is this is making excellent progress and he seems to be caring for this in an excellent fashion. I see no evidence of breakdown that would make me concerned that he was at risk for infection/amputation. This has obviously been his concern due to the fifth toe amputation that was necessitated previous when he had a similar issue. Nonetheless this seems to be progressing much more nicely. Readmission: 03/13/2019 upon evaluation today patient presents for reevaluation here in the clinic concerning issues he has been having with his right foot on the lateral portion at the proximal end of the metatarsal. Subsequently he tells me that this just opened up in the past couple of days or so and the erythema really began about 24 to 48 hours ago. Fortunately there is no signs of systemic infection. 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 That left him with this bony prominence that is where the wound is currently. There is erythema surrounding that does have me concerned about cellulitis. With that being said  he was noncompressible as far as ABIs are concerned I do think we can need to check into arterial studies at this point. Patient does have a history of hypertension along with diabetes mellitus type 2. He also tends to develop quite a bit of callus white often. 03/20/2019 upon evaluation today patient appears to be doing very well in regard to his wounds currently. He has been tolerating the dressing changes without  complication. Fortunately there is no signs of infection. His ABIs were good at this point and registered at 1.07 on the left and 1.09 on the right. In regard to the x-ray this was negative as well for any signs of osteomyelitis. The patient also seems to be doing better in regard to the infection. He still has several days of the antibiotic left at this point best the Bactrim DS and he seems to be doing very well with this. Overall I am extremely pleased with the progress in 1 week's time he is going require some sharp debridement today however. 03/27/2019 upon evaluation today patient actually appears to be making some progress in regard to his wound. It is a little deeper but measuring smaller which is good news due to the fact that again were clear away some of the necrotic tissue which is why the depth is increasing a little bit. Nonetheless after like were getting very close to being down at a good wound bed. Fortunately there is no signs of active infection at this time. There is a little bit of erythema immediately surrounding the wound that we do want to be very cognizant of and careful about. For that reason I am going to go ahead and extend his antibiotic today for an additional 10 days that is the Bactrim. 04/03/2019 upon evaluation today patient appears to be making some progress. I do feel like the Annitta Needs is doing a good job along with the alginate is being packed into the wound space behind. He has been tolerating the dressing changes without complication. Fortunately there is no signs of active infection at this time. No fevers, chills, nausea, vomiting, or diarrhea. 04/10/2019 upon evaluation today patient appears to be making progress. The wound is not quite as deep but he still does not have a great wound surface yet for working on this and the Santyl does seem to be cleaning things up. Fortunately there is no evidence of active infection at this time. No fevers, chills, nausea, vomiting,  or diarrhea. 04/17/2019 upon evaluation today patient appears to be doing excellent at this time in regard to his foot ulcer. I do believe the Santyl with the alginate packed in behind has been of benefit for him and I am very pleased in this regard. With that being said I am feeling like the wound surface is improving quite a bit each time I see him still I do not believe we are at the point of stating that the wound bed is perfect but again were making progress. The patient is going require some sharp debridement today. 4/22; this is a patient who is using Santyl with calcium alginate. Apparently his wound dimensions have been improving. Patient is changing the dressing himself. He has a surgical shoe. He works in a sitting position therefore is not on his feet all that much. There is undermining 05/01/2019 upon evaluation today patient appears to be doing a little better in regard to the size of his wound though he still has some depth. This does seem to be much  cleaner has been using Santyl on the base of the wound followed by packing with silver alginate and behind. Nonetheless I do believe that based on what we are seeing we may need to switch to a collagen type dressing endoform may actually be excellent for him. 05/08/2019 upon evaluation today patient's wound actually appears to be showing better granulation tissue in the base of the wound. With that being said he does have some epiboly noted around the edges of the wound especially plantar and more distal. Subsequently this is can require sharp debridement today. Fortunately there is no signs of active systemic infection though I do feel like there is some local cellulitis noted at this point. The patient also notes has had increased drainage. 5/13; wound volume quite a bit improved this week down to 3 mm. Patient states pain and drainage better. Culture from last week grew staph aureus [not MRSA] and this is sensitive to quinolones and he is on  Levaquin. HOWEVER he also grew abundant Enterococcus faecalis treatment of choice for this is ampicillin. Quinolone coverage is unreliable 5/20 completing the Augmentin. Small wound on the right lateral foot. Thick rolled edges around the wound. Using silver alginate 05/29/2019 upon evaluation today patient appears to be doing better compared to last time I saw him. Fortunately there is no signs of active infection and I do feel like he is actually making good progress. Overall the quality of the wound bed along with the size looks improved and the inflammation/erythema that was previously noted by myself when I saw the couple weeks back actually has also resolved and this is great. Overall very pleased with how things stand. 06/06/2019 upon evaluation today patient actually appears to be doing quite well with regard to his foot ulcer with regard to some of the granulation were seen at this point. There does not appear to be any evidence of systemic or local infection although he still has discomfort I feel like things are moving in a slow but surely correct direction. I think we may need to help fill some of the space however even though he is using the collagen we may need to have something behind this such as a plain packing strip to try to help hold the collagen in the base of the wound. 06/12/2019 upon evaluation today patient appears to be doing well with regard to his foot ulcer. This seems to be making some slow but steady progress. The wound does not appear to be as deep today compared to where it was previous. Fortunately there is no signs of active infection at this time. No fevers, chills, nausea, vomiting, or diarrhea. 06/19/2019 on evaluation today patient's wound does not appear to be doing quite as well as what I would like to see. Fortunately there is no signs of systemic infection at this time which is great news. With that being said I do believe that the patient needs to have a wound  culture as I do [name], [name]. ([id number]) believe he likely has a local infection that needs to be addressed. He is also can require some debridement today as well. 06/26/2019 upon evaluation today patient's wound actually appears to be showing signs of excellent improvement today. There does not appear to be any evidence of active infection which is great news. There does not appear to be significant erythema at this point. The patient does have improvement overall in the appearance of the infection he has been taking the Augmentin the culture came back  showing no organism was grown at this point but nonetheless I do believe he has been doing well with the antibiotics I would continue that at this point. 07/03/2019 upon evaluation today patient appears to be doing about the same in regard to his wound. There does not appear to be any evidence of active infection and overall I am pleased with where things stand. The patient is tolerating the dressing changes without complication but it does not appear that they are packing the wound at this time which I think is going to slow down his healing prospects. 07/10/2019 upon evaluation today patient appears to be doing little better in my opinion in regard to the overall moisture collected in the base of the wound. They have been packing this with a little bit of silver alginate and that has done quite well. Overall I am pleased in that regard. Subsequently the redness does not appear to be any worse today I did actually go ahead and marked this since this can be 2 weeks between now and when I see him again I would make sure that if anything changes or worsens he will be able to notice this and go ahead and initiate treatment with the Augmentin. 07/22/2019 patient comes in today a little bit early for evaluation secondary to the fact that he was having issues with increased drainage and concern about infection. With that being said he does appear to have  developed a blister area again proximal to where the original wound opening was in although the wound bed itself actually appears to be doing somewhat better he has had increased drainage and tracking of fluid here due to this blister. Nonetheless I believe that that does need to be cleaned away today but again I am not really certain that I see a lot of evidence for infection at this point. 07/29/2019 on evaluation today patient actually appears to be doing better in regard to his foot ulcer. There is improvement compared to last week which is great news. Nonetheless he does come prepared to apply the total contact cast today which I think would be beneficial for him. He knows he is not supposed to drive he did bring his son with him today in order to drive him. 07/31/2019 upon evaluation today patient appears to be doing well with regard to his wound today. I do not see any signs of infection I do believe that he is definitely showing some signs of improvement here in the office in a couple days as far as the overall erythema and pressure getting to the wound area is concerned. With that being said I do believe that the cast has been of great benefit for him. 08/08/2019 upon evaluation today patient appears to be doing better in regard to his wound. Overall I feel like he is showing signs of improvement. The external measurement is really not bad at all although I did remove some of the callus in order to open up the wound itself the depth is actually coming quite well and I am extremely pleased in that regard I do not think we can to see much improvement externally until we get the internal depth filled in and that again is improved. Fortunately there is no signs of active infection at this time. No fevers, chills, nausea, vomiting, or diarrhea. 08/15/2019 on evaluation today patient actually appears to be doing well with regard to his foot ulcer. This is showing some signs of minimal improvement from  the standpoint of  the depth and appearance of the wound as well as the edges of the wound. It is taking its time and to be honest I do believe that he seems to be progressing well. I am happy with the cast and what it is achieving at this point. Have contemplated checking into Dermagraft for Apligraf for the patient but again he does seem to be making fairly good progress in my opinion. If it has not tremendously changed come next week I may consider one of the 2. 08/22/2019 on evaluation today patient appears to be making good progress with regard to his wound I am still very pleased with where things stand today. Overall I think that he is making good improvements week by week and I believe that we are getting continue with the Total contact cast I will likely switch to a silver alginate dressing. 08/29/2019 on evaluation today patient appears to be doing quite well with regard to his wound. Fortunately this is measuring quite small. There is no signs of active infection at this time although he did have a little bit of a callus area just to the side of his foot where we previously noted some fluid collecting underneath that was the case today as well. I did have to remove this. With that being said overall he seems to be doing quite well. 09/05/19 on evaluation today patient appears to be doing very well with regard to his original wound which is actually very small on evaluation today. With that being said he does have a blistered area that's more proximal to where this wound is that has been a little bit more concerned. Fortunately there is no signs of active infection at this time. I'm going to need to remove the blister tissue today. 10 09/12/2019 on evaluation today patient actually appears to be doing much better in regard to his wound. I think he is actually improved more without the cast that he was prior with the cast. Overall very pleased with where things stand today. Electronic Signature(s)  Signed: 09/12/2019 10:05:46 AM By: [name] PA-C Entered By: [name] on 09/12/2019 10:05:45 [name] ([id number]) -------------------------------------------------------------------------------- Physical Exam Details Patient Name: [name], [name] Date of Service: 09/12/2019 8:15 AM Medical Record Number: [id number] Patient Account Number: [id number] Date of Birth/Sex: 1965/09/16 (54 y.o. M) Treating RN: Cornell Barman Primary Care Provider: [name] Other Clinician: Referring Provider: [name] Treating Provider/Extender: STONE III, HOYT Weeks in Treatment: 56 Constitutional Well-nourished and well-hydrated in no acute distress. Respiratory normal breathing without difficulty. Psychiatric this patient is able to make decisions and demonstrates good insight into disease process. Alert and Oriented x 3. pleasant and cooperative. Notes Patient's wound bed currently showed signs of good granulation there was some slough and callus minimally around the edges of the wound and central portion of the wound I performed debridement today to clear this way post debridement wound bed appears to be doing much better which is great news Electronic Signature(s) Signed: 09/12/2019 10:06:00 AM By: [name] PA-C Entered By: [name] on 09/12/2019 10:05:59 [name] ([id number]) -------------------------------------------------------------------------------- Physician Orders Details Patient Name: [name] Date of Service: 09/12/2019 8:15 AM Medical Record Number: [id number] Patient Account Number: [id number] Date of Birth/Sex: 1965/07/19 (54 y.o. 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 - in office o Dial antibacterial soap, wash wounds, rinse and pat dry prior to dressing wounds Anesthetic (add to Medication List) Wound #4 Right,Lateral Foot o Topical Lidocaine 4% cream applied to wound bed prior to debridement (In Clinic Only). Primary Wound Dressing Wound #4 Right,Lateral Foot o Gentamicin Sulfate Cream o Silver Alginate - Thin layer of gentamicin under the alginate Secondary Dressing Wound #4 Right,Lateral Foot o Conform/Kerlix - Offloading felt donut, dry gauze, roll gauze to cover Dressing Change Frequency Wound #4 Right,Lateral Foot o Change Dressing Monday, Wednesday, Friday Follow-up Appointments Wound #4 Right,Lateral Foot o Return Appointment in 1 week. Off-Loading Wound #4 Right,Lateral Foot o Open toe surgical shoe with peg assist. Electronic Signature(s) Signed: 09/12/2019 3:44:42 PM By: Grover Canavan Signed: 09/12/2019 4:38:18 PM By: [name] PA-C Entered By: Grover Canavan on 09/12/2019 08:57:34 [name], [name] ([id number]) -------------------------------------------------------------------------------- Problem List Details Patient Name: [name], [name]. Date of Service: 09/12/2019 8:15 AM Medical Record Number: [id number] Patient Account Number: [id number] Date of Birth/Sex: [birth year] (53 y.o. 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 M) Treating RN: Cornell Barman Primary Care Provider: [name] Other Clinician: Referring Provider: [name] Treating Provider/Extender: Melburn Hake, HOYT Weeks in Treatment: 26 Subjective Chief Complaint Information obtained from Patient Right lateral foot ulcer History of Present Illness (HPI) This 54 year old male comes with an ulcerated area on the plantar aspect of the right foot which she's had for approximately a month. I have known him from a previous visit at [facility] wound center and was treated in the months of April and May 2016 and rapidly healed a left plantar ulcer with a total contact cast. He has been a diabetic for about 16 years and tries to keep active and is fairly compliant with his diabetes management. 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 Past medical history significant for hypertension, hyperlipidemia, and status post appendectomy 1993. He does not smoke or drink alcohol. 01/14/2015 -- the patient had tolerated his total contact cast very well and had no problems and has had no systemic  symptoms. However when his total contact cast was cut open he had excessive amount of purulent drainage in spite of being on antibiotics. He had had a recent x-ray done in the ER 12/22/2014 which showed IMPRESSION:No evidence of osseous erosion. Known soft tissue ulceration is not  well characterized on radiograph. Scattered vascular calcifications seen. his last hemoglobin A1c in December was 7.3. He has been on Augmentin and doxycycline for the last 2 weeks. 01/21/2015 -- his culture grew rare growth of Pantoea species an MR moderate growth of Candida parapsilosis. it is sensitive to levofloxacin. He has not heard back from the insurance company regarding his hyperbaric oxygen therapy. His MRI has not been done yet and we will try and get him an earlier date 01/28/2015 -- MRI was done last night -- IMPRESSION:1. Soft tissue ulcer overlying the plantar aspect of the fifth metatarsal head extending to the cortex. Subcortical marrow edema in the fifth metatarsal head with corresponding T1 hypointensity is concerning for early osteomyelitis of the plantar lateral aspect of the fifth metatarsal head. Chest x-ray done on 01/14/2015 shows bronchiectatic changes without infiltrate. EKG done on generally 17 2017 shows a normal sinus rhythm and is a normal EKG. 02/04/2015 -- he was asked to see Dr. [name] last week and had 2 appointments but had to cancel both due to pressures of work. Last night he has woken up with severe pain in the foot and leg and it is swollen up. No fever or no change in his blood glucose. Addendum: I spoke with Dr. [name] who kindly agreed to accept the patient for inpatient therapy and have also opened to the hospitalist Dr. [name], and discuss details of the management including PICC line and repeat cultures. 02/12/2015-- -- was seen by Dr. [name] in the hospital and a PICC line was placed. He was to receive Ceftazidime 2 g every 12 hourly, oral levofloxacin 750 mg every  24 hourly and oral fluconazole 200 mg daily. The antibiotics were to be given for 4 weeks except the Diflucan was to be given for the first 2 weeks. Reviewed note from 02/10/2015 -- and Dr. [name] had recommended management for growth of MSSA and Serratia. He switched him from ceftazidime to ceftriaxone 2 g every 24 hours. Levofloxacin was stopped and he would continue on fluconazole for another week. He had asked me to decide whether further imaging was necessary and whether surgical debridement of the infected bone was needed. He is doing well and has been off work for this week and we will keep him off the next week. 02/22/2015 -- he was seen by my colleague on 01/19/2015 and at that time an incision and drainage was done on his right lateral forefoot on the dorsum. Today when I probed this wound it is frankly draining pus and it communicates with the ulcer on the plantar aspect of his right foot. The patient is still on IV ceftriaxone 2 g every 24 hours and is to be seen by Dr. [name] on Friday. 03/19/2015 -- On 03/04/2015 I spoke to Dr. 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 He recommended operative debridement which would probably result in the fifth metatarsal head and toe amputation.The patient would be referred back to [place] once he was done with surgery. He was admitted to [facility] yesterday and had surgery done by podiatry for a right fifth metatarsal acute osteomyelitis with cellulitis and abscess. He had a right foot incision and drainage with fifth metatarsal partial amputation and removal of toe infected bone and soft tissue with cultures. The wound was partially closed and packing  of the distal end was done. Patient was already on cefepime 2 g IV every 8 hourly and put on vancomycin pending final cultures.  He had grown moderate gram-negative rods, and later found to be rare diphtheroids. I received a call from Dr. Cannon Kettle the podiatrist and we discussed the above. On 03/10/2015 he was found positive for influenza a and has been put on Tamiflu. Since his discharge he has been seen by Dr. Cannon Kettle who is planning to remove his sutures this coming week. He was reviewed by Dr. [name] on 03/17/2015 and his Diflucan was stopped and vancomycin. After the 3 doses he is taking. He is going to change Ceftazidime to Zosyn 3.375 g IV every 8 hours. 03/25/2015 -- he was seen by the podiatrist a couple of days ago and the sutures were removed. She will follow back with him in 4 weeks' time and at that time x-rays will be taken and a custom molded insert would be made for his shoe. 04/01/2015 -- he was seen by Dr. [name] on 03/29/2015 who pulled the PICC line stopped his IV antibiotics and recommended starting doxycycline and levofloxacin for 2 weeks. He also stop the fluconazole. The patient will follow up with him only when necessary. [name], [name] ([id number]) Readmission: 03/15/17 on evaluation today patient presents for initial evaluation concerning the new issues although he has previously been evaluated in our clinic. Unfortunately the previous evaluation led to the patient having to proceed to amputation and so he is somewhat nervous about being here today. With that being said he has a very slight blister that occurred on the plantar aspect more medial on the right great toe that has been present for just a very short amount of time, several days. With that being said he felt like initially when he called that he somewhat overreacted but due to the fact that his previous issue led to amputation he is very cautious these days I explained that he did the right thing. With that being said he has been tolerating the dressing changes without complication mainly he just been covering this. He does not  have any discomfort in secondary to neuropathy it's unlikely that he feels much. With that being said he does tell me that the issue that he had here is that he went barefoot when he knows he should not have which subsequently led to the blisters. He states is definitely not doing that anymore. His most recent hemoglobin A1c was December and registered 6.9 his ABI today was 1.1. 03/27/17 on evaluation today patient appears to be doing great in regard to his right great toe ulcer. He has been tolerating the dressing changes without complication. The good news is this is making excellent progress and he seems to be caring for this in an excellent fashion. I see no evidence of breakdown that would make me concerned that he was at risk for infection/amputation. This has obviously been his concern due to the fifth toe amputation that was necessitated previous when he had a similar issue. Nonetheless this seems to be progressing much more nicely. Readmission: 03/13/2019 upon evaluation today patient presents for reevaluation here in the clinic concerning issues he has been having with his right foot on the lateral portion at the proximal end of the metatarsal. Subsequently he tells me that this just opened up in the past couple of days or so and the erythema really began about 24 to 48 hours ago. Fortunately there is no signs of systemic infection.  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 That left him with this bony prominence that is where the wound is currently. There is erythema surrounding that does have me concerned about cellulitis. With that being said he was noncompressible as far as ABIs are concerned I do think we can need to check into arterial studies at this point. Patient does have a history of hypertension along with diabetes mellitus type 2. He also tends to develop quite a bit of callus white often. 03/20/2019 upon evaluation today patient  appears to be doing very well in regard to his wounds currently. He has been tolerating the dressing changes without complication. Fortunately there is no signs of infection. His ABIs were good at this point and registered at 1.07 on the left and 1.09 on the right. In regard to the x-ray this was negative as well for any signs of osteomyelitis. The patient also seems to be doing better in regard to the infection. He still has several days of the antibiotic left at this point best the Bactrim DS and he seems to be doing very well with this. Overall I am extremely pleased with the progress in 1 week's time he is going require some sharp debridement today however. 03/27/2019 upon evaluation today patient actually appears to be making some progress in regard to his wound. It is a little deeper but measuring smaller which is good news due to the fact that again were clear away some of the necrotic tissue which is why the depth is increasing a little bit. Nonetheless after like were getting very close to being down at a good wound bed. Fortunately there is no signs of active infection at this time. There is a little bit of erythema immediately surrounding the wound that we do want to be very cognizant of and careful about. For that reason I am going to go ahead and extend his antibiotic today for an additional 10 days that is the Bactrim. 04/03/2019 upon evaluation today patient appears to be making some progress. I do feel like the Annitta Needs is doing a good job along with the alginate is being packed into the wound space behind. He has been tolerating the dressing changes without complication. Fortunately there is no signs of active infection at this time. No fevers, chills, nausea, vomiting, or diarrhea. 04/10/2019 upon evaluation today patient appears to be making progress. The wound is not quite as deep but he still does not have a great wound surface yet for working on this and the Santyl does seem to be  cleaning things up. Fortunately there is no evidence of active infection at this time. No fevers, chills, nausea, vomiting, or diarrhea. 04/17/2019 upon evaluation today patient appears to be doing excellent at this time in regard to his foot ulcer. I do believe the Santyl with the alginate packed in behind has been of benefit for him and I am very pleased in this regard. With that being said I am feeling like the wound surface is improving quite a bit each time I see him still I do not believe we are at the point of stating that the wound bed is perfect but again were making progress. The patient is going require some sharp debridement today. 4/22; this is a patient who is using Santyl with calcium alginate. Apparently his wound dimensions have been improving. Patient is changing the dressing himself. He has a surgical shoe. He works in a sitting  position therefore is not on his feet all that much. There is undermining 05/01/2019 upon evaluation today patient appears to be doing a little better in regard to the size of his wound though he still has some depth. This does seem to be much cleaner has been using Santyl on the base of the wound followed by packing with silver alginate and behind. Nonetheless I do believe that based on what we are seeing we may need to switch to a collagen type dressing endoform may actually be excellent for him. 05/08/2019 upon evaluation today patient's wound actually appears to be showing better granulation tissue in the base of the wound. With that being said he does have some epiboly noted around the edges of the wound especially plantar and more distal. Subsequently this is can require sharp debridement today. Fortunately there is no signs of active systemic infection though I do feel like there is some local cellulitis noted at this point. The patient also notes has had increased drainage. 5/13; wound volume quite a bit improved this week down to 3 mm. Patient states  pain and drainage better. Culture from last week grew staph aureus [not MRSA] and this is sensitive to quinolones and he is on Levaquin. HOWEVER he also grew abundant Enterococcus faecalis treatment of choice for this is ampicillin. Quinolone coverage is unreliable 5/20 completing the Augmentin. Small wound on the right lateral foot. Thick rolled edges around the wound. Using silver alginate 05/29/2019 upon evaluation today patient appears to be doing better compared to last time I saw him. Fortunately there is no signs of active infection and I do feel like he is actually making good progress. Overall the quality of the wound bed along with the size looks improved and the inflammation/erythema that was previously noted by myself when I saw the couple weeks back actually has also resolved and this is great. Overall very pleased with how things stand. 06/06/2019 upon evaluation today patient actually appears to be doing quite well with regard to his foot ulcer with regard to some of the granulation were seen at this point. There does not appear to be any evidence of systemic or local infection although he still has discomfort I feel like things are moving in a slow but surely correct direction. I think we may need to help fill some of the space however even though he is using the collagen we may need to have something behind this such as a plain packing strip to try to help hold the collagen in the base of the wound. 06/12/2019 upon evaluation today patient appears to be doing well with regard to his foot ulcer. This seems to be making some slow but steady progress. The wound does not appear to be as deep today compared to where it was previous. Fortunately there is no signs of active infection at [name], [name]. ([id number]) this time. No fevers, chills, nausea, vomiting, or diarrhea. 06/19/2019 on evaluation today patient's wound does not appear to be doing quite as well as what I would like to see.  Fortunately there is no signs of systemic infection at this time which is great news. With that being said I do believe that the patient needs to have a wound culture as I do believe he likely has a local infection that needs to be addressed. He is also can require some debridement today as well. 06/26/2019 upon evaluation today patient's wound actually appears to be showing signs of excellent improvement today. There does not  appear to be any evidence of active infection which is great news. There does not appear to be significant erythema at this point. The patient does have improvement overall in the appearance of the infection he has been taking the Augmentin the culture came back showing no organism was grown at this point but nonetheless I do believe he has been doing well with the antibiotics I would continue that at this point. 07/03/2019 upon evaluation today patient appears to be doing about the same in regard to his wound. There does not appear to be any evidence of active infection and overall I am pleased with where things stand. The patient is tolerating the dressing changes without complication but it does not appear that they are packing the wound at this time which I think is going to slow down his healing prospects. 07/10/2019 upon evaluation today patient appears to be doing little better in my opinion in regard to the overall moisture collected in the base of the wound. They have been packing this with a little bit of silver alginate and that has done quite well. Overall I am pleased in that regard. Subsequently the redness does not appear to be any worse today I did actually go ahead and marked this since this can be 2 weeks between now and when I see him again I would make sure that if anything changes or worsens he will be able to notice this and go ahead and initiate treatment with the Augmentin. 07/22/2019 patient comes in today a little bit early for evaluation secondary to the  fact that he was having issues with increased drainage and concern about infection. With that being said he does appear to have developed a blister area again proximal to where the original wound opening was in although the wound bed itself actually appears to be doing somewhat better he has had increased drainage and tracking of fluid here due to this blister. Nonetheless I believe that that does need to be cleaned away today but again I am not really certain that I see a lot of evidence for infection at this point. 07/29/2019 on evaluation today patient actually appears to be doing better in regard to his foot ulcer. There is improvement compared to last week which is great news. Nonetheless he does come prepared to apply the total contact cast today which I think would be beneficial for him. He knows he is not supposed to drive he did bring his son with him today in order to drive him. 07/31/2019 upon evaluation today patient appears to be doing well with regard to his wound today. I do not see any signs of infection I do believe that he is definitely showing some signs of improvement here in the office in a couple days as far as the overall erythema and pressure getting to the wound area is concerned. With that being said I do believe that the cast has been of great benefit for him. 08/08/2019 upon evaluation today patient appears to be doing better in regard to his wound. Overall I feel like he is showing signs of improvement. The external measurement is really not bad at all although I did remove some of the callus in order to open up the wound itself the depth is actually coming quite well and I am extremely pleased in that regard I do not think we can to see much improvement externally until we get the internal depth filled in and that again is improved. Fortunately there is  no signs of active infection at this time. No fevers, chills, nausea, vomiting, or diarrhea. 08/15/2019 on evaluation today  patient actually appears to be doing well with regard to his foot ulcer. This is showing some signs of minimal improvement from the standpoint of the depth and appearance of the wound as well as the edges of the wound. It is taking its time and to be honest I do believe that he seems to be progressing well. I am happy with the cast and what it is achieving at this point. Have contemplated checking into Dermagraft for Apligraf for the patient but again he does seem to be making fairly good progress in my opinion. If it has not tremendously changed come next week I may consider one of the 2. 08/22/2019 on evaluation today patient appears to be making good progress with regard to his wound I am still very pleased with where things stand today. Overall I think that he is making good improvements week by week and I believe that we are getting continue with the Total contact cast I will likely switch to a silver alginate dressing. 08/29/2019 on evaluation today patient appears to be doing quite well with regard to his wound. Fortunately this is measuring quite small. There is no signs of active infection at this time although he did have a little bit of a callus area just to the side of his foot where we previously noted some fluid collecting underneath that was the case today as well. I did have to remove this. With that being said overall he seems to be doing quite well. 09/05/19 on evaluation today patient appears to be doing very well with regard to his original wound which is actually very small on evaluation today. With that being said he does have a blistered area that's more proximal to where this wound is that has been a little bit more concerned. Fortunately there is no signs of active infection at this time. I'm going to need to remove the blister tissue today. 10 09/12/2019 on evaluation today patient actually appears to be doing much better in regard to his wound. I think he is actually improved  more without the cast that he was prior with the cast. Overall very pleased with where things stand today. Objective Constitutional Well-nourished and well-hydrated in no acute distress. Vitals Time Taken: 8:10 AM, Height: 74 in, Weight: 308 lbs, BMI: 39.5, Temperature: 98.5 F, Pulse: 82 bpm, Respiratory Rate: 16 breaths/min, Blood Pressure: 140/75 mmHg. [name], [name] ([id number]) Respiratory normal breathing without difficulty. Psychiatric this patient is able to make decisions and demonstrates good insight into disease process. Alert and Oriented x 3. pleasant and cooperative. General Notes: Patient's wound bed currently showed signs of good granulation there was some slough and callus minimally around the edges of the wound and central portion of the wound I performed debridement today to clear this way post debridement wound bed appears to be doing much better which is great news Integumentary (Hair, Skin) Wound #4 status is Open. Original cause of wound was Gradually Appeared. The wound is located on the Right,Lateral Foot. The wound measures 0.4cm length x 0.4cm width x 0.2cm depth; 0.126cm^2 area and 0.025cm^3 volume. There is Fat Layer (Subcutaneous Tissue) exposed. There is a medium amount of serous drainage noted. The wound margin is flat and intact. There is large (67-100%) pink granulation within the wound bed. There is a small (1-33%) amount of necrotic tissue within the wound bed including Adherent Slough.  Assessment Active Problems ICD-10 Type 2 diabetes mellitus with foot ulcer 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d Cellulitis of right lower limb Corns and callosities Essential (primary) hypertension Acquired absence of other left toe(s) Procedures Wound #4 Pre-procedure diagnosis of Wound #4 is a Diabetic Wound/Ulcer of the Lower Extremity located on the Right,Lateral Foot .Severity of Tissue Pre Debridement is: Fat  layer exposed. There was a Excisional Skin/Subcutaneous Tissue Debridement with a total area of 0.16 sq cm performed by STONE III, HOYT E., PA-C. With the following instrument(s): Curette Material removed includes Callus, Subcutaneous Tissue, and Slough after achieving pain control using Lidocaine. No specimens were taken. A time out was conducted at 08:51, prior to the start of the procedure. A Minimum amount of bleeding was controlled with Pressure. The procedure was tolerated well with a pain level of 0 throughout and a pain level of 0 following the procedure. Post Debridement Measurements: 0.4cm length x 0.4cm width x 0.2cm depth; 0.025cm^3 volume. Character of Wound/Ulcer Post Debridement is improved. Severity of Tissue Post Debridement is: Fat layer exposed. Post procedure Diagnosis Wound #4: Same as Pre-Procedure Plan Wound Cleansing: Wound #4 Right,Lateral Foot: Clean wound with Normal Saline. - in office Dial antibacterial soap, wash wounds, rinse and pat dry prior to dressing wounds Anesthetic (add to Medication List): Wound #4 Right,Lateral Foot: Topical Lidocaine 4% cream applied to wound bed prior to debridement (In Clinic Only). Primary Wound Dressing: Wound #4 Right,Lateral Foot: Gentamicin Sulfate Cream Silver Alginate - Thin layer of gentamicin under the alginate Secondary Dressing: Wound #4 Right,Lateral Foot: Conform/Kerlix - Offloading felt donut, dry gauze, roll gauze to cover Dressing Change Frequency: Wound #4 Right,Lateral Foot: Change Dressing Monday, Wednesday, Friday [name], [name] ([id number]) Follow-up Appointments: Wound #4 Right,Lateral Foot: Return Appointment in 1 week. Off-Loading: Wound #4 Right,Lateral Foot: Open toe surgical shoe with peg assist. 1. I would recommend that we going to continue with the wound care measures as before specifically with regard to the silver alginate dressing along with gentamicin I think this done very well for  him. 2. I am also can recommend that we continue with the offloading felt followed by gauze and roll gauze to secure in place. 3. Skin use offloading shoe with peg assist he will also avoid excessive walking in the past week he has done this and it did very well for him based on what I see today. We will see patient back for reevaluation in 1 week here in the clinic. If anything worsens or changes patient will contact our office for additional recommendations. Electronic Signature(s) Signed: 09/12/2019 10:06:33 AM By: [name] PA-C Entered By: [name] on 09/12/2019 10:06:33 [name] ([id number]) -------------------------------------------------------------------------------- SuperBill Details Patient Name: [name] Date of Service: 09/12/2019 Medical Record Number: [id number] Patient Account Number: [id number] Date of Birth/Sex: [birth year] (54 y.o. M) Treating RN: Cornell Barman Primary Care Provider: [name] Other Clinician: Referring Provider: [name] Treating Provider/Extender: Melburn Hake, HOYT Weeks in Treatment: 26 Diagnosis Coding ICD-10 Codes Code Description E11.621 Type 2 diabetes mellitus with foot ulcer L97.512 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d L03.115 Cellulitis of right lower limb L84 Corns and callosities I10 Essential (primary) hypertension Z89.422 Acquired absence of other left toe(s) Facility Procedures CPT4 Code: 14970263 Description: 11042 - DEB SUBQ TISSUE 20 SQ CM/< Modifier: Quantity: 1 CPT4 Code: Description: ICD-10 Diagnosis Description L97.512 Non-pressure chronic ulcer of other part of  right foot with fat layer ex Modifier: posed Quantity: Physician Procedures CPT4 Code: 6431427 Description: 67011 - WC PHYS SUBQ TISS 20 SQ CM Modifier: Quantity: 1 CPT4 Code: Description: ICD-10 Diagnosis Description L97.512 Non-pressure chronic ulcer of other part of  right foot with fat layer ex Modifier: posed Quantity: Electronic Signature(s) Signed: 09/12/2019 10:06:53 AM By: [name] PA-C Entered By: [name] on 09/12/2019 10:06:51

## 2019-09-16 NOTE — Progress Notes (Signed)
LAMOND, GLANTZ ([id number]) Visit Report for 09/12/2019 Arrival Information Details Patient Name: [name], [name]. Date of Service: 09/12/2019 8:15 AM Medical Record Number: [id number] Patient Account Number: [id number] Date of Birth/Sex: 01-Sep-1965 (54 y.o. M) Treating RN: Cornell Barman Primary Care Montay Vanvoorhis: [name] Other Clinician: Referring Garl Speigner: [name] Treating Destin Vinsant/Extender: Melburn Hake, HOYT Weeks in Treatment: 26 Visit Information History Since Last Visit Added or deleted any medications: No Patient Arrived: Ambulatory Any new allergies or adverse reactions: No Arrival Time: 08:08 Had a fall or experienced change in No Accompanied By: self activities of daily living that may affect Transfer Assistance: None risk of falls: Patient Identification Verified: Yes Signs or symptoms of abuse/neglect since last visito No Secondary Verification Process Completed: Yes Hospitalized since last visit: No Patient Requires Transmission-Based Precautions: No Implantable device outside of the clinic excluding No Patient Has Alerts: Yes cellular tissue based products placed in the center Patient Alerts: DMII since last visit: Has Dressing in Place as Prescribed: Yes Pain Present Now: No Electronic Signature(s) Signed: 09/12/2019 3:00:51 PM By: Lorine Bears RCP, RRT, CHT Entered By: Lorine Bears on 09/12/2019 08:09:04 [name] ([id number]) -------------------------------------------------------------------------------- Encounter Discharge Information Details Patient Name: [name] Date of Service: 09/12/2019 8:15 AM Medical Record Number: [id number] Patient Account Number: [id number] Date of Birth/Sex: 1965/02/13 (54 y.o. M) Treating RN: Grover Canavan Primary Care Turner Kunzman: [name] Other Clinician: Referring Kayde Warehime: [name] Treating Ervie Mccard/Extender: Melburn Hake, HOYT Weeks in  Treatment: 26 Encounter Discharge Information Items Post Procedure Vitals Discharge Condition: Stable Temperature (F): 98.5 Ambulatory Status: Ambulatory Pulse (bpm): 82 Discharge Destination: Home Respiratory Rate (breaths/min): 16 Transportation: Private Auto Blood Pressure (mmHg): 140/75 Accompanied By: self Schedule Follow-up Appointment: Yes Clinical Summary of Care: Electronic Signature(s) Signed: 09/12/2019 3:44:42 PM By: Grover Canavan Entered By: Grover Canavan on 09/12/2019 08:58:30 [name] ([id number]) -------------------------------------------------------------------------------- Lower Extremity Assessment Details Patient Name: [name] Date of Service: 09/12/2019 8:15 AM Medical Record Number: [id number] Patient Account Number: [id number] Date of Birth/Sex: 1965/12/15 (54 y.o. M) Treating RN: Cornell Barman Primary Care Madaline Lefeber: [name] Other Clinician: Referring Summerlynn Glauser: [name] Treating Makinzy Cleere/Extender: Melburn Hake, HOYT Weeks in Treatment: 26 Edema Assessment Assessed: [Left: No] [Right: Yes] Edema: [Left: Ye] [Right: s] Calf Left: Right: Point of Measurement: 33 cm From Medial Instep cm 33.5 cm Ankle Left: Right: Point of Measurement: 9 cm From Medial Instep cm 26 cm Vascular Assessment Pulses: Dorsalis Pedis Palpable: [Right:Yes] Posterior Tibial Palpable: [Right:Yes] Electronic Signature(s) Signed: 09/12/2019 10:47:19 AM By: Darci Needle Signed: 09/16/2019 4:28:55 PM By: Gretta Cool, BSN, RN, CWS, Kim RN, BSN Entered By: Darci Needle on 09/12/2019 08:33:09 [name] ([id number]) -------------------------------------------------------------------------------- Multi Wound Chart Details Patient Name: [name] Date of Service: 09/12/2019 8:15 AM Medical Record Number: [id number] Patient Account Number: [id number] Date of Birth/Sex: Dec 01, 1965 (54 y.o. M) Treating RN: Grover Canavan  Primary Care Myran Arcia: [name] Other Clinician: Referring Velvia Mehrer: [name] Treating Khaleem Burchill/Extender: Melburn Hake, HOYT Weeks in Treatment: 26 Vital Signs Height(in): 74 Pulse(bpm): 18 Weight(lbs): 308 Blood Pressure(mmHg): 140/75 Body Mass Index(BMI): 40 Temperature(F): 98.5 Respiratory Rate(breaths/min): 16 Photos: [N/A:N/A] Wound Location: Right, Lateral Foot N/A N/A Wounding Event: Gradually Appeared N/A N/A Primary Etiology: Diabetic Wound/Ulcer of the Lower N/A N/A Extremity Comorbid History: Hypertension, Type II Diabetes, N/A N/A Neuropathy Date Acquired: 03/12/2019 N/A N/A Weeks of Treatment: 26 N/A N/A Wound Status: Open N/A N/A Pending Amputation on Yes N/A N/A Presentation: Measurements L x W x D (cm) 0.4x0.4x0.2 N/A  N/A Area (cm) : 0.126 N/A N/A Volume (cm) : 0.025 N/A N/A % Reduction in Area: 80.20% N/A N/A % Reduction in Volume: 80.30% N/A N/A Classification: Grade 1 N/A N/A Exudate Amount: Medium N/A N/A Exudate Type: Serous N/A N/A Exudate Color: amber N/A N/A Wound Margin: Flat and Intact N/A N/A Granulation Amount: Large (67-100%) N/A N/A Granulation Quality: Pink N/A N/A Necrotic Amount: Small (1-33%) N/A N/A Exposed Structures: Fat Layer (Subcutaneous Tissue): N/A N/A Yes Fascia: No Tendon: No Muscle: No Joint: No Bone: No Epithelialization: Small (1-33%) N/A N/A Treatment Notes Electronic Signature(s) Signed: 09/12/2019 3:44:42 PM By: Grover Canavan Entered By: Grover Canavan on 09/12/2019 08:51:43 [name], [name] ([name] ([id number]) -------------------------------------------------------------------------------- Multi-Disciplinary Care Plan Details Patient Name: [name], [name]. Date of Service: 09/12/2019 8:15 AM Medical Record Number: [id number] Patient Account Number: [id number] Date of Birth/Sex: [birth year] (54 y.o. M) Treating RN: Grover Canavan Primary Care Keison Glendinning:  [name] Other Clinician: Referring Madylyn Insco: [name] Treating Thersea Manfredonia/Extender: Melburn Hake, HOYT Weeks in Treatment: 26 Active Inactive Nutrition Nursing Diagnoses: Impaired glucose control: actual or potential Goals: Patient/caregiver verbalizes understanding of need to maintain therapeutic glucose control per primary care physician Date Initiated: 03/13/2019 Target Resolution Date: 04/11/2019 Goal Status: Active Interventions: Assess patient nutrition upon admission and as needed per policy Notes: Wound/Skin Impairment Nursing Diagnoses: Impaired tissue integrity Goals: Ulcer/skin breakdown will have a volume reduction of 30% by week 4 Date Initiated: 03/13/2019 Target Resolution Date: 04/11/2019 Goal Status: Active Interventions: Assess ulceration(s) every visit Notes: Electronic Signature(s) Signed: 09/12/2019 3:44:42 PM By: Grover Canavan Entered By: Grover Canavan on 09/12/2019 08:51:37 [name] ([id number]) -------------------------------------------------------------------------------- Pain Assessment Details Patient Name: [name] Date of Service: 09/12/2019 8:15 AM Medical Record Number: [id number] Patient Account Number: [id number] Date of Birth/Sex: Mar 30, 1965 (54 y.o. M) Treating RN: Cornell Barman Primary Care Addysyn Fern: [name] Other Clinician: Referring Ronel Rodeheaver: [name] Treating Willie Loy/Extender: Melburn Hake, HOYT Weeks in Treatment: 26 Active Problems Location of Pain Severity and Description of Pain Patient Has Paino No Site Locations With Dressing Change: No Pain Management and Medication Current Pain Management: Electronic Signature(s) Signed: 09/12/2019 10:47:19 AM By: Darci Needle Signed: 09/16/2019 4:28:55 PM By: Gretta Cool, BSN, RN, CWS, Kim RN, BSN Entered By: Darci Needle on 09/12/2019 08:31:29 [name] ([id number])  -------------------------------------------------------------------------------- Patient/Caregiver Education Details Patient Name: [name], [name]. Date of Service: 09/12/2019 8:15 AM Medical Record Number: [id number] Patient Account Number: [id number] Date of Birth/Gender: 12-19-1965 (54 y.o. M) Treating RN: Grover Canavan Primary Care Physician: [name] Other Clinician: Referring Physician: [name] Treating Physician/Extender: Sharalyn Ink in Treatment: 26 Education Assessment Education Provided To: Patient Education Topics Provided Wound/Skin Impairment: Handouts: Caring for Your Ulcer Methods: Explain/Verbal Responses: State content correctly Electronic Signature(s) Signed: 09/12/2019 3:44:42 PM By: Grover Canavan Entered By: Grover Canavan on 09/12/2019 08:52:13 [name] ([id number]) -------------------------------------------------------------------------------- Wound Assessment Details Patient Name: [name] Date of Service: 09/12/2019 8:15 AM Medical Record Number: [id number] Patient Account Number: [id number] Date of Birth/Sex: Jun 23, 1965 (54 y.o. M) Treating RN: Cornell Barman Primary Care Appolonia Ackert: [name] Other Clinician: Referring Natsumi Whitsitt: [name] Treating Garwood Wentzell/Extender: Melburn Hake, HOYT Weeks in Treatment: 26 Wound Status Wound Number: 4 Primary Etiology: Diabetic Wound/Ulcer of the Lower Extremity Wound Location: Right, Lateral Foot Wound Status: Open Wounding Event: Gradually Appeared Comorbid History: Hypertension, Type II Diabetes, Neuropathy Date Acquired: 03/12/2019 Weeks Of Treatment: 26 Clustered Wound: No Pending Amputation On Presentation Photos Wound Measurements Length: (cm) 0.4 Width: (cm) 0.4  Depth: (cm) 0.2 Area: (cm) 0.126 Volume: (cm) 0.025 % Reduction in Area: 80.2% % Reduction in Volume: 80.3% Epithelialization: Small (1-33%) Wound Description  Classification: Grade 1 Wound Margin: Flat and Intact Exudate Amount: Medium Exudate Type: Serous Exudate Color: amber Foul Odor After Cleansing: No Slough/Fibrino Yes Wound Bed Granulation Amount: Large (67-100%) Exposed Structure Granulation Quality: Pink Fascia Exposed: No Necrotic Amount: Small (1-33%) Fat Layer (Subcutaneous Tissue) Exposed: Yes Necrotic Quality: Adherent Slough Tendon Exposed: No Muscle Exposed: No Joint Exposed: No Bone Exposed: No Electronic Signature(s) Signed: 09/12/2019 10:47:19 AM By: Darci Needle Signed: 09/16/2019 4:28:55 PM By: Gretta Cool, BSN, RN, CWS, Kim RN, BSN Entered By: Darci Needle on 09/12/2019 08:32:20 [name] ([id number]) -------------------------------------------------------------------------------- [place] Details Patient Name: [name] Date of Service: 09/12/2019 8:15 AM Medical Record Number: [id number] Patient Account Number: [id number] Date of Birth/Sex: Mar 25, 1965 (54 y.o. M) Treating RN: Cornell Barman Primary Care Deyvi Bonanno: [name] Other Clinician: Referring Sheena Simonis: [name] Treating Gavyn Ybarra/Extender: Melburn Hake, HOYT Weeks in Treatment: 26 Vital Signs Time Taken: 08:10 Temperature (F): 98.5 Height (in): 74 Pulse (bpm): 82 Weight (lbs): 308 Respiratory Rate (breaths/min): 16 Body Mass Index (BMI): 39.5 Blood Pressure (mmHg): 140/75 Reference Range: 80 - 120 mg / dl Electronic Signature(s) Signed: 09/12/2019 3:00:51 PM By: Lorine Bears RCP, RRT, CHT Entered By: Lorine Bears on 09/12/2019 08:11:41

## 2019-09-22 ENCOUNTER — Other Ambulatory Visit: Payer: Self-pay

## 2019-09-22 ENCOUNTER — Encounter: Payer: Managed Care, Other (non HMO) | Admitting: Physician Assistant

## 2019-09-22 DIAGNOSIS — E11621 Type 2 diabetes mellitus with foot ulcer: Secondary | ICD-10-CM | POA: Diagnosis not present

## 2019-09-22 NOTE — Progress Notes (Addendum)
[name] ([id number]) Visit Report for 09/22/2019 Chief Complaint Document Details Patient Name: [name], [name]. Date of Service: 09/22/2019 8:15 AM Medical Record Number: [id number] Patient Account Number: [id number] Date of Birth/Sex: December 02, 1965 (54 y.o. M) Treating RN: Cornell Barman Primary Care Provider: [name] Other Clinician: Referring Provider: [name] Treating Provider/Extender: Melburn Hake, HOYT Weeks in Treatment: 27 Information Obtained from: Patient Chief Complaint Right lateral foot ulcer Electronic Signature(s) Signed: 09/22/2019 8:42:32 AM By: [name] PA-C Entered By: [name] on 09/22/2019 08:42:31 [name] ([id number]) -------------------------------------------------------------------------------- Debridement Details Patient Name: [name] Date of Service: 09/22/2019 8:15 AM Medical Record Number: [id number] Patient Account Number: [id number] Date of Birth/Sex: Feb 28, 1965 (54 y.o. M) Treating RN: Grover Canavan Primary Care Provider: [name] Other Clinician: Referring Provider: [name] Treating Provider/Extender: Melburn Hake, HOYT Weeks in Treatment: 27 Debridement Performed for Wound #4 Right,Lateral Foot Assessment: Performed By: Physician STONE III, HOYT E., PA-C Debridement Type: Debridement Severity of Tissue Pre Debridement: Fat layer exposed Level of Consciousness (Pre- Awake and Alert procedure): Pre-procedure Verification/Time Out Yes - 08:57 Taken: Start Time: 08:58 Pain Control: Lidocaine Total Area Debrided (L x W): 0.4 (cm) x 0.4 (cm) = 0.16 (cm) Tissue and other material Viable, Non-Viable, Callus, Slough, Subcutaneous, Slough debrided: Level: Skin/Subcutaneous Tissue Debridement Description: Excisional Instrument: Curette Bleeding: Minimum Hemostasis Achieved: Pressure End Time: 08:59 Procedural Pain: 0 Post Procedural Pain: 0 Response to  Treatment: Procedure was tolerated well Level of Consciousness (Post- Awake and Alert procedure): Post Debridement Measurements of Total Wound Length: (cm) 0.4 Width: (cm) 0.4 Depth: (cm) 0.2 Volume: (cm) 0.025 Character of Wound/Ulcer Post Debridement: Improved Severity of Tissue Post Debridement: Fat layer exposed Post Procedure Diagnosis Same as Pre-procedure Electronic Signature(s) Signed: 09/22/2019 4:30:36 PM By: [name] PA-C Signed: 09/22/2019 4:33:41 PM By: Grover Canavan Entered By: Grover Canavan on 09/22/2019 08:59:54 [name] ([id number]) -------------------------------------------------------------------------------- HPI Details Patient Name: [name] Date of Service: 09/22/2019 8:15 AM Medical Record Number: [id number] Patient Account Number: [id number] Date of Birth/Sex: [birth year] (54 y.o. M) Treating RN: Grover Canavan Primary Care Provider: [name] Other Clinician: Referring Provider: [name] Treating Provider/Extender: Melburn Hake, HOYT Weeks in Treatment: 27 History of Present Illness HPI Description: This 54 year old male comes with an ulcerated area on the plantar aspect of the right foot which she's had for approximately a month. I have known him from a previous visit at [facility] wound center and was treated in the months of April and May 2016 and rapidly healed a left plantar ulcer with a total contact cast. He has been a diabetic for about 16 years and tries to keep active and is fairly compliant with his diabetes management. 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 Past medical history significant for hypertension, hyperlipidemia, and status post appendectomy 1993. He does not smoke or drink alcohol. 01/14/2015 -- the patient had tolerated his total contact cast very well and had no problems and has had no systemic symptoms. However when his total contact cast was cut open he had excessive amount of  purulent drainage in spite of being on antibiotics. He had had a recent x-ray done in the ER 12/22/2014 which showed IMPRESSION:No evidence of osseous erosion. Known soft tissue ulceration is not well characterized on radiograph. Scattered vascular calcifications seen. his last hemoglobin A1c in December was 7.3. He has been on Augmentin and doxycycline for the last 2 weeks. 01/21/2015 -- his culture grew rare growth of Pantoea species an  MR moderate growth of Candida parapsilosis. it is sensitive to levofloxacin. He has not heard back from the insurance company regarding his hyperbaric oxygen therapy. His MRI has not been done yet and we will try and get him an earlier date 01/28/2015 -- MRI was done last night -- IMPRESSION:1. Soft tissue ulcer overlying the plantar aspect of the fifth metatarsal head extending to the cortex. Subcortical marrow edema in the fifth metatarsal head with corresponding T1 hypointensity is concerning for early osteomyelitis of the plantar lateral aspect of the fifth metatarsal head. Chest x-ray done on 01/14/2015 shows bronchiectatic changes without infiltrate. EKG done on generally 17 2017 shows a normal sinus rhythm and is a normal EKG. 02/04/2015 -- he was asked to see Dr. [name] last week and had 2 appointments but had to cancel both due to pressures of work. Last night he has woken up with severe pain in the foot and leg and it is swollen up. No fever or no change in his blood glucose. Addendum: I spoke with Dr. [name] who kindly agreed to accept the patient for inpatient therapy and have also opened to the hospitalist Dr. [name], and discuss details of the management including PICC line and repeat cultures. 02/12/2015-- -- was seen by Dr. [name] in the hospital and a PICC line was placed. He was to receive Ceftazidime 2 g every 12 hourly, oral levofloxacin 750 mg every 24 hourly and oral fluconazole 200 mg daily. The antibiotics were to be given for 4  weeks except the Diflucan was to be given for the first 2 weeks. Reviewed note from 02/10/2015 -- and Dr. [name] had recommended management for growth of MSSA and Serratia. He switched him from ceftazidime to ceftriaxone 2 g every 24 hours. Levofloxacin was stopped and he would continue on fluconazole for another week. He had asked me to decide whether further imaging was necessary and whether surgical debridement of the infected bone was needed. He is doing well and has been off work for this week and we will keep him off the next week. 02/22/2015 -- he was seen by my colleague on 01/19/2015 and at that time an incision and drainage was done on his right lateral forefoot on the dorsum. Today when I probed this wound it is frankly draining pus and it communicates with the ulcer on the plantar aspect of his right foot. The patient is still on IV ceftriaxone 2 g every 24 hours and is to be seen by Dr. [name] on Friday. 03/19/2015 -- On 03/04/2015 I spoke to Dr. 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 He recommended operative debridement which would probably result in the fifth metatarsal head and toe amputation.The patient would be referred back to [place] once he was done with surgery. He was admitted to [facility] yesterday and had surgery done by podiatry for a right fifth metatarsal acute osteomyelitis with cellulitis and abscess. He had a right foot incision and drainage with fifth metatarsal partial amputation and removal of toe infected bone and soft tissue with cultures. The wound was partially closed and packing of the distal end was done. Patient was already on cefepime 2 g IV every 8 hourly and put on vancomycin pending final cultures. He had grown moderate gram-negative rods, and later found to be rare diphtheroids.  I  received a call from Dr. Cannon Kettle the podiatrist and we discussed the above. On 03/10/2015 he was found positive for influenza a and has been put on Tamiflu. Since his discharge he has been seen by Dr. Cannon Kettle who is planning to remove his sutures this coming week. He was reviewed by Dr. [name] on 03/17/2015 and his Diflucan was stopped and vancomycin. After the 3 doses he is taking. He is going to change Ceftazidime to Zosyn 3.375 g IV every 8 hours. 03/25/2015 -- he was seen by the podiatrist a couple of days ago and the sutures were removed. She will follow back with him in 4 weeks' time and at that time x-rays will be taken and a custom molded insert would be made for his shoe. 04/01/2015 -- he was seen by Dr. [name] on 03/29/2015 who pulled the PICC line stopped his IV antibiotics and recommended starting doxycycline and levofloxacin for 2 weeks. He also stop the fluconazole. The patient will follow up with him only when necessary. Readmission: 03/15/17 on evaluation today patient presents for initial evaluation concerning the new issues although he has previously been evaluated in our clinic. Unfortunately the previous evaluation led to the patient having to proceed to amputation and so he is somewhat nervous about being here [name], [name] ([id number]) today. With that being said he has a very slight blister that occurred on the plantar aspect more medial on the right great toe that has been present for just a very short amount of time, several days. With that being said he felt like initially when he called that he somewhat overreacted but due to the fact that his previous issue led to amputation he is very cautious these days I explained that he did the right thing. With that being said he has been tolerating the dressing changes without complication mainly he just been covering this. He does not have any discomfort in secondary to neuropathy it's unlikely that he feels much.  With that being said he does tell me that the issue that he had here is that he went barefoot when he knows he should not have which subsequently led to the blisters. He states is definitely not doing that anymore. His most recent hemoglobin A1c was December and registered 6.9 his ABI today was 1.1. 03/27/17 on evaluation today patient appears to be doing great in regard to his right great toe ulcer. He has been tolerating the dressing changes without complication. The good news is this is making excellent progress and he seems to be caring for this in an excellent fashion. I see no evidence of breakdown that would make me concerned that he was at risk for infection/amputation. This has obviously been his concern due to the fifth toe amputation that was necessitated previous when he had a similar issue. Nonetheless this seems to be progressing much more nicely. Readmission: 03/13/2019 upon evaluation today patient presents for reevaluation here in the clinic concerning issues he has been having with his right foot on the lateral portion at the proximal end of the metatarsal. Subsequently he tells me that this just opened up in the past couple of days or so and the erythema really began about 24 to 48 hours ago. Fortunately there is no signs of systemic infection. 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 That left him with this bony prominence that is where the wound is currently. There is erythema surrounding that does have me concerned  about cellulitis. With that being said he was noncompressible as far as ABIs are concerned I do think we can need to check into arterial studies at this point. Patient does have a history of hypertension along with diabetes mellitus type 2. He also tends to develop quite a bit of callus white often. 03/20/2019 upon evaluation today patient appears to be doing very well in regard to his wounds currently. He has been  tolerating the dressing changes without complication. Fortunately there is no signs of infection. His ABIs were good at this point and registered at 1.07 on the left and 1.09 on the right. In regard to the x-ray this was negative as well for any signs of osteomyelitis. The patient also seems to be doing better in regard to the infection. He still has several days of the antibiotic left at this point best the Bactrim DS and he seems to be doing very well with this. Overall I am extremely pleased with the progress in 1 week's time he is going require some sharp debridement today however. 03/27/2019 upon evaluation today patient actually appears to be making some progress in regard to his wound. It is a little deeper but measuring smaller which is good news due to the fact that again were clear away some of the necrotic tissue which is why the depth is increasing a little bit. Nonetheless after like were getting very close to being down at a good wound bed. Fortunately there is no signs of active infection at this time. There is a little bit of erythema immediately surrounding the wound that we do want to be very cognizant of and careful about. For that reason I am going to go ahead and extend his antibiotic today for an additional 10 days that is the Bactrim. 04/03/2019 upon evaluation today patient appears to be making some progress. I do feel like the Annitta Needs is doing a good job along with the alginate is being packed into the wound space behind. He has been tolerating the dressing changes without complication. Fortunately there is no signs of active infection at this time. No fevers, chills, nausea, vomiting, or diarrhea. 04/10/2019 upon evaluation today patient appears to be making progress. The wound is not quite as deep but he still does not have a great wound surface yet for working on this and the Santyl does seem to be cleaning things up. Fortunately there is no evidence of active infection at this  time. No fevers, chills, nausea, vomiting, or diarrhea. 04/17/2019 upon evaluation today patient appears to be doing excellent at this time in regard to his foot ulcer. I do believe the Santyl with the alginate packed in behind has been of benefit for him and I am very pleased in this regard. With that being said I am feeling like the wound surface is improving quite a bit each time I see him still I do not believe we are at the point of stating that the wound bed is perfect but again were making progress. The patient is going require some sharp debridement today. 4/22; this is a patient who is using Santyl with calcium alginate. Apparently his wound dimensions have been improving. Patient is changing the dressing himself. He has a surgical shoe. He works in a sitting position therefore is not on his feet all that much. There is undermining 05/01/2019 upon evaluation today patient appears to be doing a little better in regard to the size of his wound though he still has some depth.  This does seem to be much cleaner has been using Santyl on the base of the wound followed by packing with silver alginate and behind. Nonetheless I do believe that based on what we are seeing we may need to switch to a collagen type dressing endoform may actually be excellent for him. 05/08/2019 upon evaluation today patient's wound actually appears to be showing better granulation tissue in the base of the wound. With that being said he does have some epiboly noted around the edges of the wound especially plantar and more distal. Subsequently this is can require sharp debridement today. Fortunately there is no signs of active systemic infection though I do feel like there is some local cellulitis noted at this point. The patient also notes has had increased drainage. 5/13; wound volume quite a bit improved this week down to 3 mm. Patient states pain and drainage better. Culture from last week grew staph aureus [not MRSA] and  this is sensitive to quinolones and he is on Levaquin. HOWEVER he also grew abundant Enterococcus faecalis treatment of choice for this is ampicillin. Quinolone coverage is unreliable 5/20 completing the Augmentin. Small wound on the right lateral foot. Thick rolled edges around the wound. Using silver alginate 05/29/2019 upon evaluation today patient appears to be doing better compared to last time I saw him. Fortunately there is no signs of active infection and I do feel like he is actually making good progress. Overall the quality of the wound bed along with the size looks improved and the inflammation/erythema that was previously noted by myself when I saw the couple weeks back actually has also resolved and this is great. Overall very pleased with how things stand. 06/06/2019 upon evaluation today patient actually appears to be doing quite well with regard to his foot ulcer with regard to some of the granulation were seen at this point. There does not appear to be any evidence of systemic or local infection although he still has discomfort I feel like things are moving in a slow but surely correct direction. I think we may need to help fill some of the space however even though he is using the collagen we may need to have something behind this such as a plain packing strip to try to help hold the collagen in the base of the wound. 06/12/2019 upon evaluation today patient appears to be doing well with regard to his foot ulcer. This seems to be making some slow but steady progress. The wound does not appear to be as deep today compared to where it was previous. Fortunately there is no signs of active infection at this time. No fevers, chills, nausea, vomiting, or diarrhea. 06/19/2019 on evaluation today patient's wound does not appear to be doing quite as well as what I would like to see. Fortunately there is no signs of systemic infection at this time which is great news. With that being said I do  believe that the patient needs to have a wound culture as I do [name], [name]. ([id number]) believe he likely has a local infection that needs to be addressed. He is also can require some debridement today as well. 06/26/2019 upon evaluation today patient's wound actually appears to be showing signs of excellent improvement today. There does not appear to be any evidence of active infection which is great news. There does not appear to be significant erythema at this point. The patient does have improvement overall in the appearance of the infection he has been taking  the Augmentin the culture came back showing no organism was grown at this point but nonetheless I do believe he has been doing well with the antibiotics I would continue that at this point. 07/03/2019 upon evaluation today patient appears to be doing about the same in regard to his wound. There does not appear to be any evidence of active infection and overall I am pleased with where things stand. The patient is tolerating the dressing changes without complication but it does not appear that they are packing the wound at this time which I think is going to slow down his healing prospects. 07/10/2019 upon evaluation today patient appears to be doing little better in my opinion in regard to the overall moisture collected in the base of the wound. They have been packing this with a little bit of silver alginate and that has done quite well. Overall I am pleased in that regard. Subsequently the redness does not appear to be any worse today I did actually go ahead and marked this since this can be 2 weeks between now and when I see him again I would make sure that if anything changes or worsens he will be able to notice this and go ahead and initiate treatment with the Augmentin. 07/22/2019 patient comes in today a little bit early for evaluation secondary to the fact that he was having issues with increased drainage and concern about  infection. With that being said he does appear to have developed a blister area again proximal to where the original wound opening was in although the wound bed itself actually appears to be doing somewhat better he has had increased drainage and tracking of fluid here due to this blister. Nonetheless I believe that that does need to be cleaned away today but again I am not really certain that I see a lot of evidence for infection at this point. 07/29/2019 on evaluation today patient actually appears to be doing better in regard to his foot ulcer. There is improvement compared to last week which is great news. Nonetheless he does come prepared to apply the total contact cast today which I think would be beneficial for him. He knows he is not supposed to drive he did bring his son with him today in order to drive him. 07/31/2019 upon evaluation today patient appears to be doing well with regard to his wound today. I do not see any signs of infection I do believe that he is definitely showing some signs of improvement here in the office in a couple days as far as the overall erythema and pressure getting to the wound area is concerned. With that being said I do believe that the cast has been of great benefit for him. 08/08/2019 upon evaluation today patient appears to be doing better in regard to his wound. Overall I feel like he is showing signs of improvement. The external measurement is really not bad at all although I did remove some of the callus in order to open up the wound itself the depth is actually coming quite well and I am extremely pleased in that regard I do not think we can to see much improvement externally until we get the internal depth filled in and that again is improved. Fortunately there is no signs of active infection at this time. No fevers, chills, nausea, vomiting, or diarrhea. 08/15/2019 on evaluation today patient actually appears to be doing well with regard to his foot ulcer.  This is showing some signs of  minimal improvement from the standpoint of the depth and appearance of the wound as well as the edges of the wound. It is taking its time and to be honest I do believe that he seems to be progressing well. I am happy with the cast and what it is achieving at this point. Have contemplated checking into Dermagraft for Apligraf for the patient but again he does seem to be making fairly good progress in my opinion. If it has not tremendously changed come next week I may consider one of the 2. 08/22/2019 on evaluation today patient appears to be making good progress with regard to his wound I am still very pleased with where things stand today. Overall I think that he is making good improvements week by week and I believe that we are getting continue with the Total contact cast I will likely switch to a silver alginate dressing. 08/29/2019 on evaluation today patient appears to be doing quite well with regard to his wound. Fortunately this is measuring quite small. There is no signs of active infection at this time although he did have a little bit of a callus area just to the side of his foot where we previously noted some fluid collecting underneath that was the case today as well. I did have to remove this. With that being said overall he seems to be doing quite well. 09/05/19 on evaluation today patient appears to be doing very well with regard to his original wound which is actually very small on evaluation today. With that being said he does have a blistered area that's more proximal to where this wound is that has been a little bit more concerned. Fortunately there is no signs of active infection at this time. I'm going to need to remove the blister tissue today. 10 09/12/2019 on evaluation today patient actually appears to be doing much better in regard to his wound. I think he is actually improved more without the cast that he was prior with the cast. Overall very pleased  with where things stand today. 09/22/2019 upon evaluation today patient appears to be doing very well at this time in regard to his foot ulcer. In fact I feel like he is continue to make good progress. I see no signs right now that there is any active infection which is great news. Also see very little evidence of pressure he does have some callus buildup but again this seems to be minimal. Electronic Signature(s) Signed: 09/22/2019 3:25:13 PM By: [name] PA-C Entered By: [name] on 09/22/2019 15:25:12 KEMON, DEVINCENZI ([id number]) -------------------------------------------------------------------------------- Physical Exam Details Patient Name: [name] Date of Service: 09/22/2019 8:15 AM Medical Record Number: [id number] Patient Account Number: [id number] Date of Birth/Sex: 1965/04/07 (54 y.o. M) Treating RN: Grover Canavan Primary Care Provider: [name] Other Clinician: Referring Provider: [name] Treating Provider/Extender: Melburn Hake, HOYT Weeks in Treatment: 46 Constitutional Well-nourished and well-hydrated in no acute distress. Respiratory normal breathing without difficulty. Psychiatric this patient is able to make decisions and demonstrates good insight into disease process. Alert and Oriented x 3. pleasant and cooperative. Notes Upon inspection patient's wound bed actually showed signs of good granulation at this time there does not appear to be any evidence of active infection which is great news and overall I am actually very pleased with where he stands. I did perform some callus debridement there was minimal slough and subcutaneous tissue as well that was removed down to good wound bed overall I  think he is doing quite well. Electronic Signature(s) Signed: 09/22/2019 3:25:34 PM By: [name] PA-C Entered By: [name] on 09/22/2019 15:25:34 [name] ([id number])  -------------------------------------------------------------------------------- Physician Orders Details Patient Name: [name] Date of Service: 09/22/2019 8:15 AM Medical Record Number: [id number] Patient Account Number: [id number] Date of Birth/Sex: [birth year] (54 y.o. 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 - in office o Dial antibacterial soap, wash wounds, rinse and pat dry prior to dressing wounds Anesthetic (add to Medication List) Wound #4 Right,Lateral Foot o Topical Lidocaine 4% cream applied to wound bed prior to debridement (In Clinic Only). Primary Wound Dressing Wound #4 Right,Lateral Foot o Gentamicin Sulfate Cream o Silver Alginate - Thin layer of gentamicin under the alginate Secondary Dressing Wound #4 Right,Lateral Foot o Conform/Kerlix - Offloading felt donut, dry gauze, roll gauze to cover Dressing Change Frequency Wound #4 Right,Lateral Foot o Change Dressing Monday, Wednesday, Friday Follow-up Appointments Wound #4 Right,Lateral Foot o Return Appointment in 1 week. Electronic Signature(s) Signed: 09/22/2019 4:30:36 PM By: [name] PA-C Signed: 09/22/2019 4:33:41 PM By: Grover Canavan Entered By: Grover Canavan on 09/22/2019 09:01:56 [name], [name] ([id number])  -------------------------------------------------------------------------------- Problem List Details Patient Name: DIONE, MCCOMBIE. Date of Service: 09/22/2019 8:15 AM Medical Record Number: [id number] Patient Account Number: [id number] Date of Birth/Sex: Apr 10, 1965 (54 y.o. 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 M) Treating RN: Grover Canavan Primary Care Provider: [name] Other Clinician: Referring Provider: [name] Treating Provider/Extender: Melburn Hake, HOYT Weeks in Treatment: 27 Subjective Chief Complaint Information obtained from Patient Right lateral foot ulcer History of Present Illness (HPI) This 54 year old male comes with an ulcerated area on the plantar aspect of the right foot which she's had for approximately a month. I have known him from a previous visit at  [facility] wound center and was treated in the months of April and May 2016 and rapidly healed a left plantar ulcer with a total contact cast. He has been a diabetic for about 16 years and tries to keep active and is fairly compliant with his diabetes management. 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 Past medical history significant  for hypertension, hyperlipidemia, and status post appendectomy 1993. He does not smoke or drink alcohol. 01/14/2015 -- the patient had tolerated his total contact cast very well and had no problems and has had no systemic symptoms. However when his total contact cast was cut open he had excessive amount of purulent drainage in spite of being on antibiotics. He had had a recent x-ray done in the ER 12/22/2014 which showed IMPRESSION:No evidence of osseous erosion. Known soft tissue ulceration is not well characterized on radiograph. Scattered vascular calcifications seen. his last hemoglobin A1c in December was 7.3. He has been on Augmentin and doxycycline for the last 2 weeks. 01/21/2015 -- his culture grew rare growth of Pantoea species an MR moderate growth of Candida parapsilosis. it is sensitive to levofloxacin. He has not heard back from the insurance company regarding his hyperbaric oxygen therapy. His MRI has not been done yet and we will try and get him an earlier date 01/28/2015 -- MRI was done last night -- IMPRESSION:1. Soft tissue ulcer overlying the plantar aspect of the fifth metatarsal head extending to the cortex. Subcortical marrow edema in the fifth metatarsal head with corresponding T1 hypointensity is concerning for early osteomyelitis of the plantar lateral aspect of the fifth metatarsal head. Chest x-ray done on 01/14/2015 shows bronchiectatic changes without infiltrate. EKG done on generally 17 2017 shows a normal sinus rhythm and is a normal EKG. 02/04/2015 -- he was asked to see Dr. [name] last week and had 2 appointments but had to  cancel both due to pressures of work. Last night he has woken up with severe pain in the foot and leg and it is swollen up. No fever or no change in his blood glucose. Addendum: I spoke with Dr. [name] who kindly agreed to accept the patient for inpatient therapy and have also opened to the hospitalist Dr. [name], and discuss details of the management including PICC line and repeat cultures. 02/12/2015-- -- was seen by Dr. [name] in the hospital and a PICC line was placed. He was to receive Ceftazidime 2 g every 12 hourly, oral levofloxacin 750 mg every 24 hourly and oral fluconazole 200 mg daily. The antibiotics were to be given for 4 weeks except the Diflucan was to be given for the first 2 weeks. Reviewed note from 02/10/2015 -- and Dr. [name] had recommended management for growth of MSSA and Serratia. He switched him from ceftazidime to ceftriaxone 2 g every 24 hours. Levofloxacin was stopped and he would continue on fluconazole for another week. He had asked me to decide whether further imaging was necessary and whether surgical debridement of the infected bone was needed. He is doing well and has been off work for this week and we will keep him off the next week. 02/22/2015 -- he was seen by my colleague on 01/19/2015 and at that time an incision and drainage was done on his right lateral forefoot on the dorsum. Today when I probed this wound it is frankly draining pus and it communicates with the ulcer on the plantar aspect of his right foot. The patient is still on IV ceftriaxone 2 g every 24 hours and is to be seen by Dr. [name] on Friday. 03/19/2015 -- On 03/04/2015 I spoke to Dr. 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 He recommended  operative debridement which would probably result in the fifth metatarsal head and  toe amputation.The patient would be referred back to [place] once he was done with surgery. He was admitted to [facility] yesterday and had surgery done by podiatry for a right fifth metatarsal acute osteomyelitis with cellulitis and abscess. He had a right foot incision and drainage with fifth metatarsal partial amputation and removal of toe infected bone and soft tissue with cultures. The wound was partially closed and packing of the distal end was done. Patient was already on cefepime 2 g IV every 8 hourly and put on vancomycin pending final cultures. He had grown moderate gram-negative rods, and later found to be rare diphtheroids. I received a call from Dr. Cannon Kettle the podiatrist and we discussed the above. On 03/10/2015 he was found positive for influenza a and has been put on Tamiflu. Since his discharge he has been seen by Dr. Cannon Kettle who is planning to remove his sutures this coming week. He was reviewed by Dr. [name] on 03/17/2015 and his Diflucan was stopped and vancomycin. After the 3 doses he is taking. He is going to change Ceftazidime to Zosyn 3.375 g IV every 8 hours. 03/25/2015 -- he was seen by the podiatrist a couple of days ago and the sutures were removed. She will follow back with him in 4 weeks' time and at that time x-rays will be taken and a custom molded insert would be made for his shoe. 04/01/2015 -- he was seen by Dr. [name] on 03/29/2015 who pulled the PICC line stopped his IV antibiotics and recommended starting doxycycline and levofloxacin for 2 weeks. He also stop the fluconazole. The patient will follow up with him only when necessary. [name], [name] ([id number]) Readmission: 03/15/17 on evaluation today patient presents for initial evaluation concerning the new issues although he has previously been evaluated in our clinic. Unfortunately the previous evaluation led to the patient having to proceed to amputation and so he is somewhat nervous about  being here today. With that being said he has a very slight blister that occurred on the plantar aspect more medial on the right great toe that has been present for just a very short amount of time, several days. With that being said he felt like initially when he called that he somewhat overreacted but due to the fact that his previous issue led to amputation he is very cautious these days I explained that he did the right thing. With that being said he has been tolerating the dressing changes without complication mainly he just been covering this. He does not have any discomfort in secondary to neuropathy it's unlikely that he feels much. With that being said he does tell me that the issue that he had here is that he went barefoot when he knows he should not have which subsequently led to the blisters. He states is definitely not doing that anymore. His most recent hemoglobin A1c was December and registered 6.9 his ABI today was 1.1. 03/27/17 on evaluation today patient appears to be doing great in regard to his right great toe ulcer. He has been tolerating the dressing changes without complication. The good news is this is making excellent progress and he seems to be caring for this in an excellent fashion. I see no evidence of breakdown that would make me concerned that he was at risk for infection/amputation. This has obviously been his concern due to the fifth toe amputation that was necessitated previous when he  had a similar issue. Nonetheless this seems to be progressing much more nicely. Readmission: 03/13/2019 upon evaluation today patient presents for reevaluation here in the clinic concerning issues he has been having with his right foot on the lateral portion at the proximal end of the metatarsal. Subsequently he tells me that this just opened up in the past couple of days or so and the erythema really began about 24 to 48 hours ago. Fortunately there is no signs of systemic infection. He  does have a history of having had osteomyelitis with fifth ray amputation on the right. That left him with this bony prominence that is where the wound is currently. There is erythema surrounding that does have me concerned about cellulitis. With that being said he was noncompressible as far as ABIs are concerned I do think we can need to check into arterial studies at this point. Patient does have a history of hypertension along with diabetes mellitus type 2. He also tends to develop quite a bit of callus white often. 03/20/2019 upon evaluation today patient appears to be doing very well in regard to his wounds currently. He has been tolerating the dressing changes without complication. Fortunately there is no signs of infection. His ABIs were good at this point and registered at 1.07 on the left and 1.09 on the right. In regard to the x-ray this was negative as well for any signs of osteomyelitis. The patient also seems to be doing better in regard to the infection. He still has several days of the antibiotic left at this point best the Bactrim DS and he seems to be doing very well with this. Overall I am extremely pleased with the progress in 1 week's time he is going require some sharp debridement today however. 03/27/2019 upon evaluation today patient actually appears to be making some progress in regard to his wound. It is a little deeper but measuring smaller which is good news due to the fact that again were clear away some of the necrotic tissue which is why the depth is increasing a little bit. Nonetheless after like were getting very close to being down at a good wound bed. Fortunately there is no signs of active infection at this time. There is a little bit of erythema immediately surrounding the wound that we do want to be very cognizant of and careful about. For that reason I am going to go ahead and extend his antibiotic today for an additional 10 days that is the Bactrim. 04/03/2019 upon  evaluation today patient appears to be making some progress. I do feel like the Annitta Needs is doing a good job along with the alginate is being packed into the wound space behind. He has been tolerating the dressing changes without complication. Fortunately there is no signs of active infection at this time. No fevers, chills, nausea, vomiting, or diarrhea. 04/10/2019 upon evaluation today patient appears to be making progress. The wound is not quite as deep but he still does not have a great wound surface yet for working on this and the Santyl does seem to be cleaning things up. Fortunately there is no evidence of active infection at this time. No fevers, chills, nausea, vomiting, or diarrhea. 04/17/2019 upon evaluation today patient appears to be doing excellent at this time in regard to his foot ulcer. I do believe the Santyl with the alginate packed in behind has been of benefit for him and I am very pleased in this regard. With that being said I  am feeling like the wound surface is improving quite a bit each time I see him still I do not believe we are at the point of stating that the wound bed is perfect but again were making progress. The patient is going require some sharp debridement today. 4/22; this is a patient who is using Santyl with calcium alginate. Apparently his wound dimensions have been improving. Patient is changing the dressing himself. He has a surgical shoe. He works in a sitting position therefore is not on his feet all that much. There is undermining 05/01/2019 upon evaluation today patient appears to be doing a little better in regard to the size of his wound though he still has some depth. This does seem to be much cleaner has been using Santyl on the base of the wound followed by packing with silver alginate and behind. Nonetheless I do believe that based on what we are seeing we may need to switch to a collagen type dressing endoform may actually be excellent for him. 05/08/2019  upon evaluation today patient's wound actually appears to be showing better granulation tissue in the base of the wound. With that being said he does have some epiboly noted around the edges of the wound especially plantar and more distal. Subsequently this is can require sharp debridement today. Fortunately there is no signs of active systemic infection though I do feel like there is some local cellulitis noted at this point. The patient also notes has had increased drainage. 5/13; wound volume quite a bit improved this week down to 3 mm. Patient states pain and drainage better. Culture from last week grew staph aureus [not MRSA] and this is sensitive to quinolones and he is on Levaquin. HOWEVER he also grew abundant Enterococcus faecalis treatment of choice for this is ampicillin. Quinolone coverage is unreliable 5/20 completing the Augmentin. Small wound on the right lateral foot. Thick rolled edges around the wound. Using silver alginate 05/29/2019 upon evaluation today patient appears to be doing better compared to last time I saw him. Fortunately there is no signs of active infection and I do feel like he is actually making good progress. Overall the quality of the wound bed along with the size looks improved and the inflammation/erythema that was previously noted by myself when I saw the couple weeks back actually has also resolved and this is great. Overall very pleased with how things stand. 06/06/2019 upon evaluation today patient actually appears to be doing quite well with regard to his foot ulcer with regard to some of the granulation were seen at this point. There does not appear to be any evidence of systemic or local infection although he still has discomfort I feel like things are moving in a slow but surely correct direction. I think we may need to help fill some of the space however even though he is using the collagen we may need to have something behind this such as a plain packing  strip to try to help hold the collagen in the base of the wound. 06/12/2019 upon evaluation today patient appears to be doing well with regard to his foot ulcer. This seems to be making some slow but steady progress. The wound does not appear to be as deep today compared to where it was previous. Fortunately there is no signs of active infection at [name], [name]. ([id number]) this time. No fevers, chills, nausea, vomiting, or diarrhea. 06/19/2019 on evaluation today patient's wound does not appear to be doing quite as  well as what I would like to see. Fortunately there is no signs of systemic infection at this time which is great news. With that being said I do believe that the patient needs to have a wound culture as I do believe he likely has a local infection that needs to be addressed. He is also can require some debridement today as well. 06/26/2019 upon evaluation today patient's wound actually appears to be showing signs of excellent improvement today. There does not appear to be any evidence of active infection which is great news. There does not appear to be significant erythema at this point. The patient does have improvement overall in the appearance of the infection he has been taking the Augmentin the culture came back showing no organism was grown at this point but nonetheless I do believe he has been doing well with the antibiotics I would continue that at this point. 07/03/2019 upon evaluation today patient appears to be doing about the same in regard to his wound. There does not appear to be any evidence of active infection and overall I am pleased with where things stand. The patient is tolerating the dressing changes without complication but it does not appear that they are packing the wound at this time which I think is going to slow down his healing prospects. 07/10/2019 upon evaluation today patient appears to be doing little better in my opinion in regard to the overall moisture  collected in the base of the wound. They have been packing this with a little bit of silver alginate and that has done quite well. Overall I am pleased in that regard. Subsequently the redness does not appear to be any worse today I did actually go ahead and marked this since this can be 2 weeks between now and when I see him again I would make sure that if anything changes or worsens he will be able to notice this and go ahead and initiate treatment with the Augmentin. 07/22/2019 patient comes in today a little bit early for evaluation secondary to the fact that he was having issues with increased drainage and concern about infection. With that being said he does appear to have developed a blister area again proximal to where the original wound opening was in although the wound bed itself actually appears to be doing somewhat better he has had increased drainage and tracking of fluid here due to this blister. Nonetheless I believe that that does need to be cleaned away today but again I am not really certain that I see a lot of evidence for infection at this point. 07/29/2019 on evaluation today patient actually appears to be doing better in regard to his foot ulcer. There is improvement compared to last week which is great news. Nonetheless he does come prepared to apply the total contact cast today which I think would be beneficial for him. He knows he is not supposed to drive he did bring his son with him today in order to drive him. 07/31/2019 upon evaluation today patient appears to be doing well with regard to his wound today. I do not see any signs of infection I do believe that he is definitely showing some signs of improvement here in the office in a couple days as far as the overall erythema and pressure getting to the wound area is concerned. With that being said I do believe that the cast has been of great benefit for him. 08/08/2019 upon evaluation today patient appears to be doing  better in  regard to his wound. Overall I feel like he is showing signs of improvement. The external measurement is really not bad at all although I did remove some of the callus in order to open up the wound itself the depth is actually coming quite well and I am extremely pleased in that regard I do not think we can to see much improvement externally until we get the internal depth filled in and that again is improved. Fortunately there is no signs of active infection at this time. No fevers, chills, nausea, vomiting, or diarrhea. 08/15/2019 on evaluation today patient actually appears to be doing well with regard to his foot ulcer. This is showing some signs of minimal improvement from the standpoint of the depth and appearance of the wound as well as the edges of the wound. It is taking its time and to be honest I do believe that he seems to be progressing well. I am happy with the cast and what it is achieving at this point. Have contemplated checking into Dermagraft for Apligraf for the patient but again he does seem to be making fairly good progress in my opinion. If it has not tremendously changed come next week I may consider one of the 2. 08/22/2019 on evaluation today patient appears to be making good progress with regard to his wound I am still very pleased with where things stand today. Overall I think that he is making good improvements week by week and I believe that we are getting continue with the Total contact cast I will likely switch to a silver alginate dressing. 08/29/2019 on evaluation today patient appears to be doing quite well with regard to his wound. Fortunately this is measuring quite small. There is no signs of active infection at this time although he did have a little bit of a callus area just to the side of his foot where we previously noted some fluid collecting underneath that was the case today as well. I did have to remove this. With that being said overall he seems to be doing  quite well. 09/05/19 on evaluation today patient appears to be doing very well with regard to his original wound which is actually very small on evaluation today. With that being said he does have a blistered area that's more proximal to where this wound is that has been a little bit more concerned. Fortunately there is no signs of active infection at this time. I'm going to need to remove the blister tissue today. 10 09/12/2019 on evaluation today patient actually appears to be doing much better in regard to his wound. I think he is actually improved more without the cast that he was prior with the cast. Overall very pleased with where things stand today. 09/22/2019 upon evaluation today patient appears to be doing very well at this time in regard to his foot ulcer. In fact I feel like he is continue to make good progress. I see no signs right now that there is any active infection which is great news. Also see very little evidence of pressure he does have some callus buildup but again this seems to be minimal. Objective Constitutional [name], [name]. ([id number]) Well-nourished and well-hydrated in no acute distress. Vitals Time Taken: 8:15 AM, Height: 74 in, Weight: 308 lbs, BMI: 39.5, Temperature: 98.2 F, Pulse: 73 bpm, Respiratory Rate: 16 breaths/min, Blood Pressure: 156/68 mmHg. Respiratory normal breathing without difficulty. Psychiatric this patient is able to make decisions and demonstrates good insight  into disease process. Alert and Oriented x 3. pleasant and cooperative. General Notes: Upon inspection patient's wound bed actually showed signs of good granulation at this time there does not appear to be any evidence of active infection which is great news and overall I am actually very pleased with where he stands. I did perform some callus debridement there was minimal slough and subcutaneous tissue as well that was removed down to good wound bed overall I think he is doing  quite well. Integumentary (Hair, Skin) Wound #4 status is Open. Original cause of wound was Gradually Appeared. The wound is located on the Right,Lateral Foot. The wound measures 0.4cm length x 0.4cm width x 0.1cm depth; 0.126cm^2 area and 0.013cm^3 volume. There is Fat Layer (Subcutaneous Tissue) exposed. There is a medium amount of serous drainage noted. The wound margin is flat and intact. There is large (67-100%) pink granulation within the wound bed. There is a small (1-33%) amount of necrotic tissue within the wound bed including Adherent Slough. Assessment Active Problems ICD-10 Type 2 diabetes mellitus with foot ulcer 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d Cellulitis of right lower limb Corns and callosities Essential (primary) hypertension Acquired absence of other left toe(s) Procedures Wound #4 Pre-procedure diagnosis of Wound #4 is a Diabetic Wound/Ulcer of the Lower Extremity located on the Right,Lateral Foot .Severity of Tissue Pre Debridement is: Fat layer exposed. There was a Excisional Skin/Subcutaneous Tissue Debridement with a total area of 0.16 sq cm performed by STONE III, HOYT E., PA-C. With the following instrument(s): Curette to remove Viable and Non-Viable tissue/material. Material removed includes Callus, Subcutaneous Tissue, and Slough after achieving pain control using Lidocaine. No specimens were taken. A time out was conducted at 08:57, prior to the start of the procedure. A Minimum amount of bleeding was controlled with Pressure. The procedure was tolerated well with a pain level of 0 throughout and a pain level of 0 following the procedure. Post Debridement Measurements: 0.4cm length x 0.4cm width x 0.2cm depth; 0.025cm^3 volume. Character of Wound/Ulcer Post Debridement is improved. Severity of Tissue Post Debridement is: Fat layer exposed. Post procedure Diagnosis Wound #4: Same as Pre-Procedure Plan Wound Cleansing:  Wound #4 Right,Lateral Foot: Clean wound with Normal Saline. - in office Dial antibacterial soap, wash wounds, rinse and pat dry prior to dressing wounds Anesthetic (add to Medication List): Wound #4 Right,Lateral Foot: Topical Lidocaine 4% cream applied to wound bed prior to debridement (In Clinic Only). Primary Wound Dressing: Wound #4 Right,Lateral Foot: Gentamicin Sulfate Cream Silver Alginate - Thin layer of gentamicin under the alginate [name], [name] ([id number]) Secondary Dressing: Wound #4 Right,Lateral Foot: Conform/Kerlix - Offloading felt donut, dry gauze, roll gauze to cover Dressing Change Frequency: Wound #4 Right,Lateral Foot: Change Dressing Monday, Wednesday, Friday Follow-up Appointments: Wound #4 Right,Lateral Foot: Return Appointment in 1 week. 1. I recommend at this time that we go ahead and initiate treatment with a continuation of the wound care measures as before specifically with regard to the silver alginate dressing with gentamicin underneath which I think has done well. 2. He will continue with the foam offloading as well following. 3. I am also going to suggest he continue to limit his walking I think he got a good job of this and overall his wound seems to be healing quite nicely. We will see patient back for reevaluation in 1 week here in the clinic. If anything worsens or changes patient will contact our office for  additional recommendations. Electronic Signature(s) Signed: 09/22/2019 3:26:10 PM By: [name] PA-C Entered By: [name] on 09/22/2019 15:26:10 [name] ([id number]) -------------------------------------------------------------------------------- SuperBill Details Patient Name: [name] Date of Service: 09/22/2019 Medical Record Number: [id number] Patient Account Number: [id number] Date of Birth/Sex: 1965-05-05 (54 y.o. M) Treating RN: Grover Canavan Primary Care Provider: [name]  Other Clinician: Referring Provider: [name] Treating Provider/Extender: Melburn Hake, HOYT Weeks in Treatment: 27 Diagnosis Coding ICD-10 Codes Code Description E11.621 Type 2 diabetes mellitus with foot ulcer L97.512 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d L03.115 Cellulitis of right lower limb L84 Corns and callosities I10 Essential (primary) hypertension Z89.422 Acquired absence of other left toe(s) Facility Procedures CPT4 Code: 87681157 Description: 26203 - DEB SUBQ TISSUE 20 SQ CM/< Modifier: Quantity: 1 CPT4 Code: Description: ICD-10 Diagnosis Description L97.512 Non-pressure chronic ulcer of other part of right foot with fat layer ex Modifier: posed Quantity: Physician Procedures CPT4 Code: 5597416 Description: 11042 - WC PHYS SUBQ TISS 20 SQ CM Modifier: Quantity: 1 CPT4 Code: Description: ICD-10 Diagnosis Description L97.512 Non-pressure chronic ulcer of other part of right foot with fat layer ex Modifier: posed Quantity: Electronic Signature(s) Signed: 09/22/2019 3:26:23 PM By: [name] PA-C Entered By: [name] on 09/22/2019 15:26:21

## 2019-09-29 ENCOUNTER — Encounter: Payer: Managed Care, Other (non HMO) | Admitting: Physician Assistant

## 2019-09-29 ENCOUNTER — Other Ambulatory Visit: Payer: Self-pay

## 2019-09-29 DIAGNOSIS — E11621 Type 2 diabetes mellitus with foot ulcer: Secondary | ICD-10-CM | POA: Diagnosis not present

## 2019-09-29 NOTE — Progress Notes (Addendum)
KEDAR, SEDANO ([id number]) Visit Report for 09/29/2019 Chief Complaint Document Details Patient Name: [name], [name]. Date of Service: 09/29/2019 8:00 AM Medical Record Number: [id number] Patient Account Number: [id number] Date of Birth/Sex: Jun 20, 1965 (54 y.o. M) Treating RN: Grover Canavan Primary Care Provider: [name] Other Clinician: Referring Provider: [name] Treating Provider/Extender: Melburn Hake, Gabrielle Wakeland Weeks in Treatment: 28 Information Obtained from: Patient Chief Complaint Right lateral foot ulcer Electronic Signature(s) Signed: 09/29/2019 8:27:51 AM By: [name] PA-C Entered By: [name] on 09/29/2019 08:27:51 [name], [name] ([id number]) -------------------------------------------------------------------------------- HPI Details Patient Name: [name] Date of Service: 09/29/2019 8:00 AM Medical Record Number: [id number] Patient Account Number: [id number] Date of Birth/Sex: October 06, 1965 (54 y.o. M) Treating RN: Grover Canavan Primary Care Provider: [name] Other Clinician: Referring Provider: [name] Treating Provider/Extender: Melburn Hake, Taegan Haider Weeks in Treatment: 28 History of Present Illness HPI Description: This 54 year old male comes with an ulcerated area on the plantar aspect of the right foot which she's had for approximately a month. I have known him from a previous visit at [facility] wound center and was treated in the months of April and May 2016 and rapidly healed a left plantar ulcer with a total contact cast. He has been a diabetic for about 16 years and tries to keep active and is fairly compliant with his diabetes management. 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 Past medical history significant for hypertension, hyperlipidemia, and status post appendectomy 1993. He does not smoke or drink alcohol. 01/14/2015 -- the patient had tolerated his total contact cast very well and  had no problems and has had no systemic symptoms. However when his total contact cast was cut open he had excessive amount of purulent drainage in spite of being on antibiotics. He had had a recent x-ray done in the ER 12/22/2014 which showed IMPRESSION:No evidence of osseous erosion. Known soft tissue ulceration is not well characterized on radiograph. Scattered vascular calcifications seen. his last hemoglobin A1c in December was 7.3. He has been on Augmentin and doxycycline for the last 2 weeks. 01/21/2015 -- his culture grew rare growth of Pantoea species an MR moderate growth of Candida parapsilosis. it is sensitive to levofloxacin. He has not heard back from the insurance company regarding his hyperbaric oxygen therapy. His MRI has not been done yet and we will try and get him an earlier date 01/28/2015 -- MRI was done last night -- IMPRESSION:1. Soft tissue ulcer overlying the plantar aspect of the fifth metatarsal head extending to the cortex. Subcortical marrow edema in the fifth metatarsal head with corresponding T1 hypointensity is concerning for early osteomyelitis of the plantar lateral aspect of the fifth metatarsal head. Chest x-ray done on 01/14/2015 shows bronchiectatic changes without infiltrate. EKG done on generally 17 2017 shows a normal sinus rhythm and is a normal EKG. 02/04/2015 -- he was asked to see Dr. [name] last week and had 2 appointments but had to cancel both due to pressures of work. Last night he has woken up with severe pain in the foot and leg and it is swollen up. No fever or no change in his blood glucose. Addendum: I spoke with Dr. [name] who kindly agreed to accept the patient for inpatient therapy and have also opened to the hospitalist Dr. [name], and discuss details of the management including PICC line and repeat cultures. 02/12/2015-- -- was seen by Dr. [name] in the hospital and a PICC line was placed. He was to receive Ceftazidime 2  g every 12  hourly, oral levofloxacin 750 mg every 24 hourly and oral fluconazole 200 mg daily. The antibiotics were to be given for 4 weeks except the Diflucan was to be given for the first 2 weeks. Reviewed note from 02/10/2015 -- and Dr. [name] had recommended management for growth of MSSA and Serratia. He switched him from ceftazidime to ceftriaxone 2 g every 24 hours. Levofloxacin was stopped and he would continue on fluconazole for another week. He had asked me to decide whether further imaging was necessary and whether surgical debridement of the infected bone was needed. He is doing well and has been off work for this week and we will keep him off the next week. 02/22/2015 -- he was seen by my colleague on 01/19/2015 and at that time an incision and drainage was done on his right lateral forefoot on the dorsum. Today when I probed this wound it is frankly draining pus and it communicates with the ulcer on the plantar aspect of his right foot. The patient is still on IV ceftriaxone 2 g every 24 hours and is to be seen by Dr. [name] on Friday. 03/19/2015 -- On 03/04/2015 I spoke to Dr. 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 He recommended operative debridement which would probably result in the fifth metatarsal head and toe amputation.The patient would be referred back to [place] once he was done with surgery. He was admitted to [facility] yesterday and had surgery done by podiatry for a right fifth metatarsal acute osteomyelitis with cellulitis and abscess. He had a right foot incision and drainage with fifth metatarsal partial amputation and removal of toe infected bone and soft tissue with cultures. The wound was partially closed and packing of the distal end was done. Patient was already on cefepime 2 g IV every 8 hourly and put  on vancomycin pending final cultures. He had grown moderate gram-negative rods, and later found to be rare diphtheroids. I received a call from Dr. Cannon Kettle the podiatrist and we discussed the above. On 03/10/2015 he was found positive for influenza a and has been put on Tamiflu. Since his discharge he has been seen by Dr. Cannon Kettle who is planning to remove his sutures this coming week. He was reviewed by Dr. [name] on 03/17/2015 and his Diflucan was stopped and vancomycin. After the 3 doses he is taking. He is going to change Ceftazidime to Zosyn 3.375 g IV every 8 hours. 03/25/2015 -- he was seen by the podiatrist a couple of days ago and the sutures were removed. She will follow back with him in 4 weeks' time and at that time x-rays will be taken and a custom molded insert would be made for his shoe. 04/01/2015 -- he was seen by Dr. [name] on 03/29/2015 who pulled the PICC line stopped his IV antibiotics and recommended starting doxycycline and levofloxacin for 2 weeks. He also stop the fluconazole. The patient will follow up with him only when necessary. Readmission: 03/15/17 on evaluation today patient presents for initial evaluation concerning the new issues although he has previously been evaluated in our clinic. Unfortunately the previous evaluation led to the patient having to proceed to amputation and so he is somewhat nervous about being here [name], [name] ([id number]) today. With that being said he has a very  slight blister that occurred on the plantar aspect more medial on the right great toe that has been present for just a very short amount of time, several days. With that being said he felt like initially when he called that he somewhat overreacted but due to the fact that his previous issue led to amputation he is very cautious these days I explained that he did the right thing. With that being said he has been tolerating the dressing changes without complication mainly  he just been covering this. He does not have any discomfort in secondary to neuropathy it's unlikely that he feels much. With that being said he does tell me that the issue that he had here is that he went barefoot when he knows he should not have which subsequently led to the blisters. He states is definitely not doing that anymore. His most recent hemoglobin A1c was December and registered 6.9 his ABI today was 1.1. 03/27/17 on evaluation today patient appears to be doing great in regard to his right great toe ulcer. He has been tolerating the dressing changes without complication. The good news is this is making excellent progress and he seems to be caring for this in an excellent fashion. I see no evidence of breakdown that would make me concerned that he was at risk for infection/amputation. This has obviously been his concern due to the fifth toe amputation that was necessitated previous when he had a similar issue. Nonetheless this seems to be progressing much more nicely. Readmission: 03/13/2019 upon evaluation today patient presents for reevaluation here in the clinic concerning issues he has been having with his right foot on the lateral portion at the proximal end of the metatarsal. Subsequently he tells me that this just opened up in the past couple of days or so and the erythema really began about 24 to 48 hours ago. Fortunately there is no signs of systemic infection. 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 That left him with this bony prominence that is where the wound is currently. There is erythema surrounding that does have me concerned about cellulitis. With that being said he was noncompressible as far as ABIs are concerned I do think we can need to check into arterial studies at this point. Patient does have a history of hypertension along with diabetes mellitus type 2. He also tends to develop quite a bit of callus white  often. 03/20/2019 upon evaluation today patient appears to be doing very well in regard to his wounds currently. He has been tolerating the dressing changes without complication. Fortunately there is no signs of infection. His ABIs were good at this point and registered at 1.07 on the left and 1.09 on the right. In regard to the x-ray this was negative as well for any signs of osteomyelitis. The patient also seems to be doing better in regard to the infection. He still has several days of the antibiotic left at this point best the Bactrim DS and he seems to be doing very well with this. Overall I am extremely pleased with the progress in 1 week's time he is going require some sharp debridement today however. 03/27/2019 upon evaluation today patient actually appears to be making some progress in regard to his wound. It is a little deeper but measuring smaller which is good news due to the fact that again were clear away some of the necrotic tissue which is why the depth is increasing  a little bit. Nonetheless after like were getting very close to being down at a good wound bed. Fortunately there is no signs of active infection at this time. There is a little bit of erythema immediately surrounding the wound that we do want to be very cognizant of and careful about. For that reason I am going to go ahead and extend his antibiotic today for an additional 10 days that is the Bactrim. 04/03/2019 upon evaluation today patient appears to be making some progress. I do feel like the Annitta Needs is doing a good job along with the alginate is being packed into the wound space behind. He has been tolerating the dressing changes without complication. Fortunately there is no signs of active infection at this time. No fevers, chills, nausea, vomiting, or diarrhea. 04/10/2019 upon evaluation today patient appears to be making progress. The wound is not quite as deep but he still does not have a great wound surface yet for  working on this and the Santyl does seem to be cleaning things up. Fortunately there is no evidence of active infection at this time. No fevers, chills, nausea, vomiting, or diarrhea. 04/17/2019 upon evaluation today patient appears to be doing excellent at this time in regard to his foot ulcer. I do believe the Santyl with the alginate packed in behind has been of benefit for him and I am very pleased in this regard. With that being said I am feeling like the wound surface is improving quite a bit each time I see him still I do not believe we are at the point of stating that the wound bed is perfect but again were making progress. The patient is going require some sharp debridement today. 4/22; this is a patient who is using Santyl with calcium alginate. Apparently his wound dimensions have been improving. Patient is changing the dressing himself. He has a surgical shoe. He works in a sitting position therefore is not on his feet all that much. There is undermining 05/01/2019 upon evaluation today patient appears to be doing a little better in regard to the size of his wound though he still has some depth. This does seem to be much cleaner has been using Santyl on the base of the wound followed by packing with silver alginate and behind. Nonetheless I do believe that based on what we are seeing we may need to switch to a collagen type dressing endoform may actually be excellent for him. 05/08/2019 upon evaluation today patient's wound actually appears to be showing better granulation tissue in the base of the wound. With that being said he does have some epiboly noted around the edges of the wound especially plantar and more distal. Subsequently this is can require sharp debridement today. Fortunately there is no signs of active systemic infection though I do feel like there is some local cellulitis noted at this point. The patient also notes has had increased drainage. 5/13; wound volume quite a bit  improved this week down to 3 mm. Patient states pain and drainage better. Culture from last week grew staph aureus [not MRSA] and this is sensitive to quinolones and he is on Levaquin. HOWEVER he also grew abundant Enterococcus faecalis treatment of choice for this is ampicillin. Quinolone coverage is unreliable 5/20 completing the Augmentin. Small wound on the right lateral foot. Thick rolled edges around the wound. Using silver alginate 05/29/2019 upon evaluation today patient appears to be doing better compared to last time I saw him. Fortunately there is no  signs of active infection and I do feel like he is actually making good progress. Overall the quality of the wound bed along with the size looks improved and the inflammation/erythema that was previously noted by myself when I saw the couple weeks back actually has also resolved and this is great. Overall very pleased with how things stand. 06/06/2019 upon evaluation today patient actually appears to be doing quite well with regard to his foot ulcer with regard to some of the granulation were seen at this point. There does not appear to be any evidence of systemic or local infection although he still has discomfort I feel like things are moving in a slow but surely correct direction. I think we may need to help fill some of the space however even though he is using the collagen we may need to have something behind this such as a plain packing strip to try to help hold the collagen in the base of the wound. 06/12/2019 upon evaluation today patient appears to be doing well with regard to his foot ulcer. This seems to be making some slow but steady progress. The wound does not appear to be as deep today compared to where it was previous. Fortunately there is no signs of active infection at this time. No fevers, chills, nausea, vomiting, or diarrhea. 06/19/2019 on evaluation today patient's wound does not appear to be doing quite as well as what I would  like to see. Fortunately there is no signs of systemic infection at this time which is great news. With that being said I do believe that the patient needs to have a wound culture as I do [name], [name]. ([id number]) believe he likely has a local infection that needs to be addressed. He is also can require some debridement today as well. 06/26/2019 upon evaluation today patient's wound actually appears to be showing signs of excellent improvement today. There does not appear to be any evidence of active infection which is great news. There does not appear to be significant erythema at this point. The patient does have improvement overall in the appearance of the infection he has been taking the Augmentin the culture came back showing no organism was grown at this point but nonetheless I do believe he has been doing well with the antibiotics I would continue that at this point. 07/03/2019 upon evaluation today patient appears to be doing about the same in regard to his wound. There does not appear to be any evidence of active infection and overall I am pleased with where things stand. The patient is tolerating the dressing changes without complication but it does not appear that they are packing the wound at this time which I think is going to slow down his healing prospects. 07/10/2019 upon evaluation today patient appears to be doing little better in my opinion in regard to the overall moisture collected in the base of the wound. They have been packing this with a little bit of silver alginate and that has done quite well. Overall I am pleased in that regard. Subsequently the redness does not appear to be any worse today I did actually go ahead and marked this since this can be 2 weeks between now and when I see him again I would make sure that if anything changes or worsens he will be able to notice this and go ahead and initiate treatment with the Augmentin. 07/22/2019 patient comes in today a  little bit early for evaluation secondary to the fact  that he was having issues with increased drainage and concern about infection. With that being said he does appear to have developed a blister area again proximal to where the original wound opening was in although the wound bed itself actually appears to be doing somewhat better he has had increased drainage and tracking of fluid here due to this blister. Nonetheless I believe that that does need to be cleaned away today but again I am not really certain that I see a lot of evidence for infection at this point. 07/29/2019 on evaluation today patient actually appears to be doing better in regard to his foot ulcer. There is improvement compared to last week which is great news. Nonetheless he does come prepared to apply the total contact cast today which I think would be beneficial for him. He knows he is not supposed to drive he did bring his son with him today in order to drive him. 07/31/2019 upon evaluation today patient appears to be doing well with regard to his wound today. I do not see any signs of infection I do believe that he is definitely showing some signs of improvement here in the office in a couple days as far as the overall erythema and pressure getting to the wound area is concerned. With that being said I do believe that the cast has been of great benefit for him. 08/08/2019 upon evaluation today patient appears to be doing better in regard to his wound. Overall I feel like he is showing signs of improvement. The external measurement is really not bad at all although I did remove some of the callus in order to open up the wound itself the depth is actually coming quite well and I am extremely pleased in that regard I do not think we can to see much improvement externally until we get the internal depth filled in and that again is improved. Fortunately there is no signs of active infection at this time. No fevers, chills, nausea,  vomiting, or diarrhea. 08/15/2019 on evaluation today patient actually appears to be doing well with regard to his foot ulcer. This is showing some signs of minimal improvement from the standpoint of the depth and appearance of the wound as well as the edges of the wound. It is taking its time and to be honest I do believe that he seems to be progressing well. I am happy with the cast and what it is achieving at this point. Have contemplated checking into Dermagraft for Apligraf for the patient but again he does seem to be making fairly good progress in my opinion. If it has not tremendously changed come next week I may consider one of the 2. 08/22/2019 on evaluation today patient appears to be making good progress with regard to his wound I am still very pleased with where things stand today. Overall I think that he is making good improvements week by week and I believe that we are getting continue with the Total contact cast I will likely switch to a silver alginate dressing. 08/29/2019 on evaluation today patient appears to be doing quite well with regard to his wound. Fortunately this is measuring quite small. There is no signs of active infection at this time although he did have a little bit of a callus area just to the side of his foot where we previously noted some fluid collecting underneath that was the case today as well. I did have to remove this. With that being said overall he seems  to be doing quite well. 09/05/19 on evaluation today patient appears to be doing very well with regard to his original wound which is actually very small on evaluation today. With that being said he does have a blistered area that's more proximal to where this wound is that has been a little bit more concerned. Fortunately there is no signs of active infection at this time. I'm going to need to remove the blister tissue today. 10 09/12/2019 on evaluation today patient actually appears to be doing much better in  regard to his wound. I think he is actually improved more without the cast that he was prior with the cast. Overall very pleased with where things stand today. 09/22/2019 upon evaluation today patient appears to be doing very well at this time in regard to his foot ulcer. In fact I feel like he is continue to make good progress. I see no signs right now that there is any active infection which is great news. Also see very little evidence of pressure he does have some callus buildup but again this seems to be minimal. 09/29/2019 upon evaluation today patient appears to be doing well with regard to his foot ulcer. He has been tolerating the dressing changes without complication. Fortunately there is no signs of active infection at this time. No fevers, chills, nausea, vomiting, or diarrhea. Electronic Signature(s) Signed: 09/29/2019 9:18:08 AM By: [name] PA-C Entered By: [name] on 09/29/2019 09:18:07 [name] ([id number]) -------------------------------------------------------------------------------- Physical Exam Details Patient Name: [name], [name] Date of Service: 09/29/2019 8:00 AM Medical Record Number: [id number] Patient Account Number: [id number] Date of Birth/Sex: 1965-11-14 (54 y.o. M) Treating RN: Grover Canavan Primary Care Provider: [name] Other Clinician: Referring Provider: [name] Treating Provider/Extender: STONE III, Chauna Osoria Weeks in Treatment: 39 Constitutional Well-nourished and well-hydrated in no acute distress. Respiratory normal breathing without difficulty. Psychiatric this patient is able to make decisions and demonstrates good insight into disease process. Alert and Oriented x 3. pleasant and cooperative. Notes Inspection patient's wound bed actually showed excellent granulation and epithelization overall I feel like she is doing quite well there just one deeper section is still has not closed I do believe the  Santyl has been beneficial up to this point however. We have avoided any aggressive sharp debridement as the patient is on blood thinners and does bleed quite readily. Nonetheless I think she is making excellent progress her pain is also dramatically improved. Electronic Signature(s) Signed: 09/29/2019 9:22:36 AM By: [name] PA-C Entered By: [name] on 09/29/2019 09:22:34 [name] ([id number]) -------------------------------------------------------------------------------- Physician Orders Details Patient Name: [name] Date of Service: 09/29/2019 8:00 AM Medical Record Number: [id number] Patient Account Number: [id number] Date of Birth/Sex: 1965-04-28 (54 y.o. 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 Electronic Signature(s) Signed: 09/29/2019 4:50:05 PM By: Grover Canavan Signed: 09/29/2019 5:06:45 PM By: [name] PA-C Entered By: Melina Copa,  Sharyn Lull on 09/29/2019 08:33:27 [name], [name] ([id number]) -------------------------------------------------------------------------------- Problem List Details Patient Name: [name], [name]. Date of Service: 09/29/2019 8:00 AM Medical Record Number: [id number] Patient Account Number: [id number] Date of Birth/Sex: 1965-08-13  (54 y.o. M) Treating RN: Grover Canavan Primary Care Provider: [name] Other Clinician: Referring Provider: [name] Treating Provider/Extender: Melburn Hake, Chantille Navarrete Weeks in Treatment: 28 Active Problems ICD-10 Encounter Code Description Active Date MDM Diagnosis E11.621 Type 2 diabetes mellitus with foot ulcer 03/13/2019 No Yes L97.512 Non-pressure chronic ulcer of other part of right foot with fat layer 03/13/2019 No Yes exposed L03.115 Cellulitis of right lower limb 05/15/2019 No Yes L84 Corns and callosities 03/13/2019 No Yes I10 Essential (primary) hypertension 03/13/2019 No Yes 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 M) Treating RN: Grover Canavan Primary Care Provider: [name] Other Clinician: Referring Provider: [name] Treating Provider/Extender: Melburn Hake, Lopez Dentinger Weeks in Treatment: 28 Subjective Chief Complaint Information obtained from Patient Right lateral foot ulcer History of Present Illness (HPI) This 54 year old male comes with an ulcerated area on the plantar aspect of the right foot which she's had for approximately a month. I have known him from a previous visit at [facility] wound center and was treated in the months of April and May 2016 and rapidly healed a left plantar ulcer with a total contact cast. He has been a diabetic for about 16 years and tries to keep active and is fairly compliant with his diabetes management. 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 Past medical history significant for hypertension, hyperlipidemia, and status post appendectomy 1993. He does not smoke or drink alcohol. 01/14/2015 -- the patient had tolerated his total contact cast very well and had no problems and has had no systemic symptoms. However when his total contact cast was cut open he had excessive amount of purulent drainage in spite of being on antibiotics. He had had a recent x-ray done in the ER 12/22/2014 which showed IMPRESSION:No evidence of osseous erosion. Known soft tissue ulceration is not well characterized on radiograph. Scattered vascular calcifications seen. his last hemoglobin A1c in December was 7.3. He has been on Augmentin and doxycycline for the last 2 weeks. 01/21/2015 -- his culture grew rare growth of Pantoea species an MR moderate growth of Candida parapsilosis. it is sensitive to levofloxacin. He has not heard back from the insurance company regarding his hyperbaric oxygen therapy. His MRI has not been done yet and we will try and get him an earlier date 01/28/2015 -- MRI was done last night -- IMPRESSION:1. Soft tissue ulcer overlying the plantar aspect of the fifth metatarsal head extending to the cortex. Subcortical marrow edema in the fifth metatarsal head with corresponding T1 hypointensity is concerning for early osteomyelitis of the plantar lateral aspect of the fifth metatarsal head. Chest x-ray done on 01/14/2015 shows bronchiectatic changes without infiltrate. EKG done on generally 17 2017 shows a normal sinus rhythm and is a normal EKG. 02/04/2015 -- he was asked to see Dr. [name] last week and had 2 appointments but had to cancel both due to pressures of work. Last night he has woken up with severe pain in  the foot and leg and it is swollen up. No fever or no change in his blood glucose. Addendum: I spoke with Dr. [name] who kindly agreed to accept the patient for inpatient therapy and  have also opened to the hospitalist Dr. [name], and discuss details of the management including PICC line and repeat cultures. 02/12/2015-- -- was seen by Dr. [name] in the hospital and a PICC line was placed. He was to receive Ceftazidime 2 g every 12 hourly, oral levofloxacin 750 mg every 24 hourly and oral fluconazole 200 mg daily. The antibiotics were to be given for 4 weeks except the Diflucan was to be given for the first 2 weeks. Reviewed note from 02/10/2015 -- and Dr. [name] had recommended management for growth of MSSA and Serratia. He switched him from ceftazidime to ceftriaxone 2 g every 24 hours. Levofloxacin was stopped and he would continue on fluconazole for another week. He had asked me to decide whether further imaging was necessary and whether surgical debridement of the infected bone was needed. He is doing well and has been off work for this week and we will keep him off the next week. 02/22/2015 -- he was seen by my colleague on 01/19/2015 and at that time an incision and drainage was done on his right lateral forefoot on the dorsum. Today when I probed this wound it is frankly draining pus and it communicates with the ulcer on the plantar aspect of his right foot. The patient is still on IV ceftriaxone 2 g every 24 hours and is to be seen by Dr. [name] on Friday. 03/19/2015 -- On 03/04/2015 I spoke to Dr. 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 He recommended operative debridement which would probably result in the fifth metatarsal head and toe amputation.The patient would be referred back to [place] once he was done with surgery. He was admitted to [facility] yesterday and had surgery done by podiatry for a right fifth metatarsal acute osteomyelitis with cellulitis and abscess. He had a right foot  incision and drainage with fifth metatarsal partial amputation and removal of toe infected bone and soft tissue with cultures. The wound was partially closed and packing of the distal end was done. Patient was already on cefepime 2 g IV every 8 hourly and put on vancomycin pending final cultures. He had grown moderate gram-negative rods, and later found to be rare diphtheroids. I received a call from Dr. Cannon Kettle the podiatrist and we discussed the above. On 03/10/2015 he was found positive for influenza a and has been put on Tamiflu. Since his discharge he has been seen by Dr. Cannon Kettle who is planning to remove his sutures this coming week. He was reviewed by Dr. [name] on 03/17/2015 and his Diflucan was stopped and vancomycin. After the 3 doses he is taking. He is going to change Ceftazidime to Zosyn 3.375 g IV every 8 hours. 03/25/2015 -- he was seen by the podiatrist a couple of days ago and the sutures were removed. She will follow back with him in 4 weeks' time and at that time x-rays will be taken and a custom molded insert would be made for his shoe. 04/01/2015 -- he was seen by Dr. [name] on 03/29/2015 who pulled the PICC line stopped his IV antibiotics and recommended starting doxycycline and  levofloxacin for 2 weeks. He also stop the fluconazole. The patient will follow up with him only when necessary. [name], [name] ([id number]) Readmission: 03/15/17 on evaluation today patient presents for initial evaluation concerning the new issues although he has previously been evaluated in our clinic. Unfortunately the previous evaluation led to the patient having to proceed to amputation and so he is somewhat nervous about being here today. With that being said he has a very slight blister that occurred on the plantar aspect more medial on the right great toe that has been present for just a very short amount of time, several days. With that being said he felt like initially when he  called that he somewhat overreacted but due to the fact that his previous issue led to amputation he is very cautious these days I explained that he did the right thing. With that being said he has been tolerating the dressing changes without complication mainly he just been covering this. He does not have any discomfort in secondary to neuropathy it's unlikely that he feels much. With that being said he does tell me that the issue that he had here is that he went barefoot when he knows he should not have which subsequently led to the blisters. He states is definitely not doing that anymore. His most recent hemoglobin A1c was December and registered 6.9 his ABI today was 1.1. 03/27/17 on evaluation today patient appears to be doing great in regard to his right great toe ulcer. He has been tolerating the dressing changes without complication. The good news is this is making excellent progress and he seems to be caring for this in an excellent fashion. I see no evidence of breakdown that would make me concerned that he was at risk for infection/amputation. This has obviously been his concern due to the fifth toe amputation that was necessitated previous when he had a similar issue. Nonetheless this seems to be progressing much more nicely. Readmission: 03/13/2019 upon evaluation today patient presents for reevaluation here in the clinic concerning issues he has been having with his right foot on the lateral portion at the proximal end of the metatarsal. Subsequently he tells me that this just opened up in the past couple of days or so and the erythema really began about 24 to 48 hours ago. Fortunately there is no signs of systemic infection. 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 That left him with this bony prominence that is where the wound is currently. There is erythema surrounding that does have me concerned about cellulitis. With that being said he  was noncompressible as far as ABIs are concerned I do think we can need to check into arterial studies at this point. Patient does have a history of hypertension along with diabetes mellitus type 2. He also tends to develop quite a bit of callus white often. 03/20/2019 upon evaluation today patient appears to be doing very well in regard to his wounds currently. He has been tolerating the dressing changes without complication. Fortunately there is no signs of infection. His ABIs were good at this point and registered at 1.07 on the left and 1.09 on the right. In regard to the x-ray this was negative as well for any signs of osteomyelitis. The patient also seems to be doing better in regard to the infection. He still has several days of the antibiotic left at this point best the Bactrim DS and he seems to  be doing very well with this. Overall I am extremely pleased with the progress in 1 week's time he is going require some sharp debridement today however. 03/27/2019 upon evaluation today patient actually appears to be making some progress in regard to his wound. It is a little deeper but measuring smaller which is good news due to the fact that again were clear away some of the necrotic tissue which is why the depth is increasing a little bit. Nonetheless after like were getting very close to being down at a good wound bed. Fortunately there is no signs of active infection at this time. There is a little bit of erythema immediately surrounding the wound that we do want to be very cognizant of and careful about. For that reason I am going to go ahead and extend his antibiotic today for an additional 10 days that is the Bactrim. 04/03/2019 upon evaluation today patient appears to be making some progress. I do feel like the Annitta Needs is doing a good job along with the alginate is being packed into the wound space behind. He has been tolerating the dressing changes without complication. Fortunately there is no  signs of active infection at this time. No fevers, chills, nausea, vomiting, or diarrhea. 04/10/2019 upon evaluation today patient appears to be making progress. The wound is not quite as deep but he still does not have a great wound surface yet for working on this and the Santyl does seem to be cleaning things up. Fortunately there is no evidence of active infection at this time. No fevers, chills, nausea, vomiting, or diarrhea. 04/17/2019 upon evaluation today patient appears to be doing excellent at this time in regard to his foot ulcer. I do believe the Santyl with the alginate packed in behind has been of benefit for him and I am very pleased in this regard. With that being said I am feeling like the wound surface is improving quite a bit each time I see him still I do not believe we are at the point of stating that the wound bed is perfect but again were making progress. The patient is going require some sharp debridement today. 4/22; this is a patient who is using Santyl with calcium alginate. Apparently his wound dimensions have been improving. Patient is changing the dressing himself. He has a surgical shoe. He works in a sitting position therefore is not on his feet all that much. There is undermining 05/01/2019 upon evaluation today patient appears to be doing a little better in regard to the size of his wound though he still has some depth. This does seem to be much cleaner has been using Santyl on the base of the wound followed by packing with silver alginate and behind. Nonetheless I do believe that based on what we are seeing we may need to switch to a collagen type dressing endoform may actually be excellent for him. 05/08/2019 upon evaluation today patient's wound actually appears to be showing better granulation tissue in the base of the wound. With that being said he does have some epiboly noted around the edges of the wound especially plantar and more distal. Subsequently this is can  require sharp debridement today. Fortunately there is no signs of active systemic infection though I do feel like there is some local cellulitis noted at this point. The patient also notes has had increased drainage. 5/13; wound volume quite a bit improved this week down to 3 mm. Patient states pain and drainage better. Culture from  last week grew staph aureus [not MRSA] and this is sensitive to quinolones and he is on Levaquin. HOWEVER he also grew abundant Enterococcus faecalis treatment of choice for this is ampicillin. Quinolone coverage is unreliable 5/20 completing the Augmentin. Small wound on the right lateral foot. Thick rolled edges around the wound. Using silver alginate 05/29/2019 upon evaluation today patient appears to be doing better compared to last time I saw him. Fortunately there is no signs of active infection and I do feel like he is actually making good progress. Overall the quality of the wound bed along with the size looks improved and the inflammation/erythema that was previously noted by myself when I saw the couple weeks back actually has also resolved and this is great. Overall very pleased with how things stand. 06/06/2019 upon evaluation today patient actually appears to be doing quite well with regard to his foot ulcer with regard to some of the granulation were seen at this point. There does not appear to be any evidence of systemic or local infection although he still has discomfort I feel like things are moving in a slow but surely correct direction. I think we may need to help fill some of the space however even though he is using the collagen we may need to have something behind this such as a plain packing strip to try to help hold the collagen in the base of the wound. 06/12/2019 upon evaluation today patient appears to be doing well with regard to his foot ulcer. This seems to be making some slow but steady progress. The wound does not appear to be as deep today  compared to where it was previous. Fortunately there is no signs of active infection at [name], [name]. ([id number]) this time. No fevers, chills, nausea, vomiting, or diarrhea. 06/19/2019 on evaluation today patient's wound does not appear to be doing quite as well as what I would like to see. Fortunately there is no signs of systemic infection at this time which is great news. With that being said I do believe that the patient needs to have a wound culture as I do believe he likely has a local infection that needs to be addressed. He is also can require some debridement today as well. 06/26/2019 upon evaluation today patient's wound actually appears to be showing signs of excellent improvement today. There does not appear to be any evidence of active infection which is great news. There does not appear to be significant erythema at this point. The patient does have improvement overall in the appearance of the infection he has been taking the Augmentin the culture came back showing no organism was grown at this point but nonetheless I do believe he has been doing well with the antibiotics I would continue that at this point. 07/03/2019 upon evaluation today patient appears to be doing about the same in regard to his wound. There does not appear to be any evidence of active infection and overall I am pleased with where things stand. The patient is tolerating the dressing changes without complication but it does not appear that they are packing the wound at this time which I think is going to slow down his healing prospects. 07/10/2019 upon evaluation today patient appears to be doing little better in my opinion in regard to the overall moisture collected in the base of the wound. They have been packing this with a little bit of silver alginate and that has done quite well. Overall I am pleased in  that regard. Subsequently the redness does not appear to be any worse today I did actually go ahead and  marked this since this can be 2 weeks between now and when I see him again I would make sure that if anything changes or worsens he will be able to notice this and go ahead and initiate treatment with the Augmentin. 07/22/2019 patient comes in today a little bit early for evaluation secondary to the fact that he was having issues with increased drainage and concern about infection. With that being said he does appear to have developed a blister area again proximal to where the original wound opening was in although the wound bed itself actually appears to be doing somewhat better he has had increased drainage and tracking of fluid here due to this blister. Nonetheless I believe that that does need to be cleaned away today but again I am not really certain that I see a lot of evidence for infection at this point. 07/29/2019 on evaluation today patient actually appears to be doing better in regard to his foot ulcer. There is improvement compared to last week which is great news. Nonetheless he does come prepared to apply the total contact cast today which I think would be beneficial for him. He knows he is not supposed to drive he did bring his son with him today in order to drive him. 07/31/2019 upon evaluation today patient appears to be doing well with regard to his wound today. I do not see any signs of infection I do believe that he is definitely showing some signs of improvement here in the office in a couple days as far as the overall erythema and pressure getting to the wound area is concerned. With that being said I do believe that the cast has been of great benefit for him. 08/08/2019 upon evaluation today patient appears to be doing better in regard to his wound. Overall I feel like he is showing signs of improvement. The external measurement is really not bad at all although I did remove some of the callus in order to open up the wound itself the depth is actually coming quite well and I am  extremely pleased in that regard I do not think we can to see much improvement externally until we get the internal depth filled in and that again is improved. Fortunately there is no signs of active infection at this time. No fevers, chills, nausea, vomiting, or diarrhea. 08/15/2019 on evaluation today patient actually appears to be doing well with regard to his foot ulcer. This is showing some signs of minimal improvement from the standpoint of the depth and appearance of the wound as well as the edges of the wound. It is taking its time and to be honest I do believe that he seems to be progressing well. I am happy with the cast and what it is achieving at this point. Have contemplated checking into Dermagraft for Apligraf for the patient but again he does seem to be making fairly good progress in my opinion. If it has not tremendously changed come next week I may consider one of the 2. 08/22/2019 on evaluation today patient appears to be making good progress with regard to his wound I am still very pleased with where things stand today. Overall I think that he is making good improvements week by week and I believe that we are getting continue with the Total contact cast I will likely switch to a silver alginate dressing. 08/29/2019  on evaluation today patient appears to be doing quite well with regard to his wound. Fortunately this is measuring quite small. There is no signs of active infection at this time although he did have a little bit of a callus area just to the side of his foot where we previously noted some fluid collecting underneath that was the case today as well. I did have to remove this. With that being said overall he seems to be doing quite well. 09/05/19 on evaluation today patient appears to be doing very well with regard to his original wound which is actually very small on evaluation today. With that being said he does have a blistered area that's more proximal to where this wound  is that has been a little bit more concerned. Fortunately there is no signs of active infection at this time. I'm going to need to remove the blister tissue today. 10 09/12/2019 on evaluation today patient actually appears to be doing much better in regard to his wound. I think he is actually improved more without the cast that he was prior with the cast. Overall very pleased with where things stand today. 09/22/2019 upon evaluation today patient appears to be doing very well at this time in regard to his foot ulcer. In fact I feel like he is continue to make good progress. I see no signs right now that there is any active infection which is great news. Also see very little evidence of pressure he does have some callus buildup but again this seems to be minimal. 09/29/2019 upon evaluation today patient appears to be doing well with regard to his foot ulcer. He has been tolerating the dressing changes without complication. Fortunately there is no signs of active infection at this time. No fevers, chills, nausea, vomiting, or diarrhea. [name], [name] ([id number]) Objective Constitutional Well-nourished and well-hydrated in no acute distress. Vitals Time Taken: 8:00 AM, Height: 74 in, Weight: 308 lbs, BMI: 39.5, Temperature: 98.5 F, Pulse: 69 bpm, Respiratory Rate: 16 breaths/min, Blood Pressure: 154/85 mmHg. Respiratory normal breathing without difficulty. Psychiatric this patient is able to make decisions and demonstrates good insight into disease process. Alert and Oriented x 3. pleasant and cooperative. General Notes: Inspection patient's wound bed actually showed excellent granulation and epithelization overall I feel like she is doing quite well there just one deeper section is still has not closed I do believe the Santyl has been beneficial up to this point however. We have avoided any aggressive sharp debridement as the patient is on blood thinners and does bleed quite readily.  Nonetheless I think she is making excellent progress her pain is also dramatically improved. Integumentary (Hair, Skin) Wound #4 status is Open. Original cause of wound was Gradually Appeared. The wound is located on the Right,Lateral Foot. The wound measures 0.2cm length x 0.3cm width x 0.1cm depth; 0.047cm^2 area and 0.005cm^3 volume. There is Fat Layer (Subcutaneous Tissue) exposed. There is a medium amount of serous drainage noted. The wound margin is flat and intact. There is large (67-100%) pink granulation within the wound bed. There is a small (1-33%) amount of necrotic tissue within the wound bed including Adherent Slough. Assessment Active Problems ICD-10 Type 2 diabetes mellitus with foot ulcer 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d Cellulitis of right lower limb Corns and callosities Essential (primary) hypertension Acquired absence of other left toe(s) Plan Discharge From [facility] Services: Wound #4 Right,Lateral Foot: Discharge from [facility] - treatment  completed General Notes: continue to pad area for the next month. 1. I would recommend that we go ahead and continue with the wound care measures as before for prevention using the offloading orthopedic felt which seems to have done well for him. He is using a little bit of triple antibiotic ointment over top of the wound itself. 2. I am also can recommend at this time that we have the patient continue to monitor for any signs of worsening right now I feel like he is actually healing quite well and to be honest I think he is done a great job taking care of this I do not believe he is going to require the cast any further in fact he seems to be doing better with less injury to the side of his foot without the cast at the moment. We will see the patient back for a follow-up visit as needed. He does note he is good to go back and talk to his podiatrist about a new set of  shoes. Electronic Signature(s) Signed: 09/29/2019 9:23:44 AM By: [name] PA-C Entered By: [name] on 09/29/2019 09:23:43 [name], [name] ([name] ([id number]) -------------------------------------------------------------------------------- SuperBill Details Patient Name: [name] Date of Service: 09/29/2019 Medical Record Number: [id number] Patient Account Number: [id number] Date of Birth/Sex: August 14, 1965 (54 y.o. M) Treating RN: Grover Canavan Primary Care Provider: [name] Other Clinician: Referring Provider: [name] Treating Provider/Extender: Melburn Hake, Miguelina Fore Weeks in Treatment: 28 Diagnosis Coding ICD-10 Codes Code Description E11.621 Type 2 diabetes mellitus with foot ulcer L97.512 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d L03.115 Cellulitis of right lower limb L84 Corns and callosities I10 Essential (primary) hypertension Z89.422 Acquired absence of other left toe(s) Facility Procedures CPT4 Code: 06269485 Description: 46270 - WOUND CARE VISIT-LEV 2 EST PT Modifier: Quantity: 1 Physician Procedures CPT4 Code: 3500938 Description: 99213 - WC PHYS LEVEL 3 - EST PT Modifier: Quantity: 1 CPT4 Code: Description: ICD-10 Diagnosis Description E11.621 Type 2 diabetes mellitus with foot ulcer L97.512 Non-pressure chronic ulcer of other part of right foot with fat layer e L03.115 Cellulitis of right lower limb L84 Corns and callosities Modifier: xposed Quantity: Electronic Signature(s) Signed: 09/29/2019 9:25:28 AM By: [name] PA-C Entered By: [name] on 09/29/2019 18:29:93

## 2019-10-07 ENCOUNTER — Ambulatory Visit: Payer: Managed Care, Other (non HMO) | Admitting: Physician Assistant

## 2019-10-14 ENCOUNTER — Ambulatory Visit: Payer: Managed Care, Other (non HMO) | Admitting: Physician Assistant

## 2019-10-20 NOTE — Progress Notes (Signed)
LANARD, ARGUIJO ([id number]) Visit Report for 09/22/2019 Arrival Information Details Patient Name: [name], [name]. Date of Service: 09/22/2019 8:15 AM Medical Record Number: [id number] Patient Account Number: [id number] Date of Birth/Sex: [birth year] (54 y.o. M) Treating RN: Cornell Barman Primary Care Laikyn Gewirtz: [name] Other Clinician: Referring Marelyn Rouser: [name] Treating Robertlee Rogacki/Extender: Melburn Hake, HOYT Weeks in Treatment: 54 Visit Information History Since Last Visit Added or deleted any medications: No Patient Arrived: Ambulatory Any new allergies or adverse reactions: No Arrival Time: 08:20 Had a fall or experienced change in No Accompanied By: self activities of daily living that may affect Transfer Assistance: None risk of falls: Patient Identification Verified: Yes Signs or symptoms of abuse/neglect since last visito No Secondary Verification Process Completed: Yes Hospitalized since last visit: No Patient Requires Transmission-Based Precautions: No Implantable device outside of the clinic excluding No Patient Has Alerts: Yes cellular tissue based products placed in the center Patient Alerts: DMII since last visit: Has Dressing in Place as Prescribed: Yes Pain Present Now: No Electronic Signature(s) Signed: 09/22/2019 4:27:49 PM By: Lorine Bears RCP, RRT, CHT Entered By: Lorine Bears on 09/22/2019 08:20:50 [name] ([id number]) -------------------------------------------------------------------------------- Encounter Discharge Information Details Patient Name: [name] Date of Service: 09/22/2019 8:15 AM Medical Record Number: [id number] Patient Account Number: [id number] Date of Birth/Sex: Nov 09, 1965 (54 y.o. M) Treating RN: Grover Canavan Primary Care Harveer Sadler: [name] Other Clinician: Referring Araiyah Cumpton: [name] Treating Mishayla Sliwinski/Extender: Melburn Hake, HOYT Weeks in  Treatment: 106 Encounter Discharge Information Items Post Procedure Vitals Discharge Condition: Stable Temperature (F): 98.2 Ambulatory Status: Ambulatory Pulse (bpm): 73 Discharge Destination: Home Respiratory Rate (breaths/min): 16 Transportation: Private Auto Blood Pressure (mmHg): 156/68 Accompanied By: self Schedule Follow-up Appointment: Yes Clinical Summary of Care: Electronic Signature(s) Signed: 09/22/2019 4:33:41 PM By: Grover Canavan Entered By: Grover Canavan on 09/22/2019 09:02:58 [name] ([id number]) -------------------------------------------------------------------------------- Lower Extremity Assessment Details Patient Name: [name] Date of Service: 09/22/2019 8:15 AM Medical Record Number: [id number] Patient Account Number: [id number] Date of Birth/Sex: [birth year] (54 y.o. M) Treating RN: Cornell Barman Primary Care Artez Regis: [name] Other Clinician: Referring Kamren Heskett: [name] Treating Canisha Issac/Extender: Melburn Hake, HOYT Weeks in Treatment: 27 Edema Assessment Assessed: [Left: No] [Right: Yes] Edema: [Left: N] [Right: o] Calf Left: Right: Point of Measurement: 33 cm From Medial Instep cm 36 cm Ankle Left: Right: Point of Measurement: 9 cm From Medial Instep cm 25 cm Vascular Assessment Pulses: Dorsalis Pedis Palpable: [Right:Yes] Posterior Tibial Palpable: [Right:Yes] Electronic Signature(s) Signed: 09/22/2019 12:07:35 PM By: Darci Needle Signed: 10/20/2019 10:28:27 AM By: Gretta Cool, BSN, RN, CWS, Kim RN, BSN Entered By: Darci Needle on 09/22/2019 08:35:39 [name] ([id number]) -------------------------------------------------------------------------------- Multi Wound Chart Details Patient Name: [name], [name]. Date of Service: 09/22/2019 8:15 AM Medical Record Number: [id number] Patient Account Number: [id number] Date of Birth/Sex: November 06, 1965 (54 y.o. M) Treating RN: Grover Canavan Primary Care Junnie Loschiavo: [name] Other Clinician: Referring Verlan Grotz: [name] Treating Kasean Denherder/Extender: Melburn Hake, HOYT Weeks in Treatment: 27 Vital Signs Height(in): 74 Pulse(bpm): 9 Weight(lbs): 308 Blood Pressure(mmHg): 156/68 Body Mass Index(BMI): 40 Temperature(F): 98.2 Respiratory Rate(breaths/min): 16 Photos: [N/A:N/A] Wound Location: Right, Lateral Foot N/A N/A Wounding Event: Gradually Appeared N/A N/A Primary Etiology: Diabetic Wound/Ulcer of the Lower N/A N/A Extremity Comorbid History: Hypertension, Type II Diabetes, N/A N/A Neuropathy Date Acquired: 03/12/2019 N/A N/A Weeks of Treatment: 27 N/A N/A Wound Status: Open N/A N/A Pending Amputation on Yes N/A N/A Presentation: Measurements L x W x D (cm) 0.4x0.4x0.1 N/A  N/A Area (cm) : 0.126 N/A N/A Volume (cm) : 0.013 N/A N/A % Reduction in Area: 80.20% N/A N/A % Reduction in Volume: 89.80% N/A N/A Classification: Grade 1 N/A N/A Exudate Amount: Medium N/A N/A Exudate Type: Serous N/A N/A Exudate Color: amber N/A N/A Wound Margin: Flat and Intact N/A N/A Granulation Amount: Large (67-100%) N/A N/A Granulation Quality: Pink N/A N/A Necrotic Amount: Small (1-33%) N/A N/A Exposed Structures: Fat Layer (Subcutaneous Tissue): N/A N/A Yes Fascia: No Tendon: No Muscle: No Joint: No Bone: No Epithelialization: Small (1-33%) N/A N/A Treatment Notes Electronic Signature(s) Signed: 09/22/2019 4:33:41 PM By: Grover Canavan Entered By: Grover Canavan on 09/22/2019 08:55:43 AL, BRACEWELL ([name] ([id number]) -------------------------------------------------------------------------------- Multi-Disciplinary Care Plan Details Patient Name: [name], [name]. Date of Service: 09/22/2019 8:15 AM Medical Record Number: [id number] Patient Account Number: [id number] Date of Birth/Sex: 02-Jan-1966 (54 y.o. M) Treating RN: Grover Canavan Primary Care  Tonette Koehne: [name] Other Clinician: Referring Malcomb Gangemi: [name] Treating Tegh Franek/Extender: Melburn Hake, HOYT Weeks in Treatment: 27 Active Inactive Nutrition Nursing Diagnoses: Impaired glucose control: actual or potential Goals: Patient/caregiver verbalizes understanding of need to maintain therapeutic glucose control per primary care physician Date Initiated: 03/13/2019 Target Resolution Date: 04/11/2019 Goal Status: Active Interventions: Assess patient nutrition upon admission and as needed per policy Notes: Wound/Skin Impairment Nursing Diagnoses: Impaired tissue integrity Goals: Ulcer/skin breakdown will have a volume reduction of 30% by week 4 Date Initiated: 03/13/2019 Target Resolution Date: 04/11/2019 Goal Status: Active Interventions: Assess ulceration(s) every visit Notes: Electronic Signature(s) Signed: 09/22/2019 4:33:41 PM By: Grover Canavan Entered By: Grover Canavan on 09/22/2019 08:55:36 [name] ([id number]) -------------------------------------------------------------------------------- Pain Assessment Details Patient Name: [name] Date of Service: 09/22/2019 8:15 AM Medical Record Number: [id number] Patient Account Number: [id number] Date of Birth/Sex: [birth year] (54 y.o. M) Treating RN: Cornell Barman Primary Care Jessy Calixte: [name] Other Clinician: Referring Wilfredo Canterbury: [name] Treating Charlen Bakula/Extender: Melburn Hake, HOYT Weeks in Treatment: 27 Active Problems Location of Pain Severity and Description of Pain Patient Has Paino No Site Locations With Dressing Change: No Pain Management and Medication Current Pain Management: Electronic Signature(s) Signed: 09/22/2019 12:07:35 PM By: Darci Needle Signed: 10/20/2019 10:28:27 AM By: Gretta Cool, BSN, RN, CWS, Kim RN, BSN Entered By: Darci Needle on 09/22/2019 08:34:07 [name]  ([id number]) -------------------------------------------------------------------------------- Patient/Caregiver Education Details Patient Name: [name], [name]. Date of Service: 09/22/2019 8:15 AM Medical Record Number: [id number] Patient Account Number: [id number] Date of Birth/Gender: [birth year] (54 y.o. M) Treating RN: Grover Canavan Primary Care Physician: [name] Other Clinician: Referring Physician: [name] Treating Physician/Extender: Sharalyn Ink in Treatment: 2 Education Assessment Education Provided To: Patient Education Topics Provided Wound/Skin Impairment: Methods: Explain/Verbal Responses: State content correctly Electronic Signature(s) Signed: 09/22/2019 4:33:41 PM By: Grover Canavan Entered By: Grover Canavan on 09/22/2019 08:56:05 [name] ([id number]) -------------------------------------------------------------------------------- Wound Assessment Details Patient Name: [name] Date of Service: 09/22/2019 8:15 AM Medical Record Number: [id number] Patient Account Number: [id number] Date of Birth/Sex: [birth year] (54 y.o. M) Treating RN: Cornell Barman Primary Care Margan Elias: [name] Other Clinician: Referring Santita Hunsberger: [name] Treating Rielyn Krupinski/Extender: Melburn Hake, HOYT Weeks in Treatment: 27 Wound Status Wound Number: 4 Primary Etiology: Diabetic Wound/Ulcer of the Lower Extremity Wound Location: Right, Lateral Foot Wound Status: Open Wounding Event: Gradually Appeared Comorbid History: Hypertension, Type II Diabetes, Neuropathy Date Acquired: 03/12/2019 Weeks Of Treatment: 27 Clustered Wound: No Pending Amputation On Presentation Photos Wound Measurements Length: (cm) 0.4 Width: (cm) 0.4 Depth: (cm) 0.1 Area: (cm)  0.126 Volume: (cm) 0.013 % Reduction in Area: 80.2% % Reduction in Volume: 89.8% Epithelialization: Small (1-33%) Wound Description Classification: Grade 1 Wound  Margin: Flat and Intact Exudate Amount: Medium Exudate Type: Serous Exudate Color: amber Foul Odor After Cleansing: No Slough/Fibrino Yes Wound Bed Granulation Amount: Large (67-100%) Exposed Structure Granulation Quality: Pink Fascia Exposed: No Necrotic Amount: Small (1-33%) Fat Layer (Subcutaneous Tissue) Exposed: Yes Necrotic Quality: Adherent Slough Tendon Exposed: No Muscle Exposed: No Joint Exposed: No Bone Exposed: No Electronic Signature(s) Signed: 09/22/2019 12:07:35 PM By: Darci Needle Signed: 10/20/2019 10:28:27 AM By: Gretta Cool, BSN, RN, CWS, Kim RN, BSN Entered By: Darci Needle on 09/22/2019 08:35:05 [name] ([id number]) -------------------------------------------------------------------------------- Vitals Details Patient Name: [name] Date of Service: 09/22/2019 8:15 AM Medical Record Number: [id number] Patient Account Number: [id number] Date of Birth/Sex: 1965/08/14 (54 y.o. M) Treating RN: Cornell Barman Primary Care Destry Dauber: [name] Other Clinician: Referring Krupa Stege: [name] Treating Tameria Patti/Extender: Melburn Hake, HOYT Weeks in Treatment: 27 Vital Signs Time Taken: 08:15 Temperature (F): 98.2 Height (in): 74 Pulse (bpm): 73 Weight (lbs): 308 Respiratory Rate (breaths/min): 16 Body Mass Index (BMI): 39.5 Blood Pressure (mmHg): 156/68 Reference Range: 80 - 120 mg / dl Electronic Signature(s) Signed: 09/22/2019 4:27:49 PM By: Lorine Bears RCP, RRT, CHT Entered By: Becky Sax, Amado Nash on 09/22/2019 08:22:05

## 2019-10-21 ENCOUNTER — Ambulatory Visit: Payer: Managed Care, Other (non HMO) | Admitting: Physician Assistant

## 2019-10-28 ENCOUNTER — Ambulatory Visit: Payer: Managed Care, Other (non HMO) | Admitting: Physician Assistant

## 2019-10-28 ENCOUNTER — Other Ambulatory Visit: Payer: Self-pay

## 2019-10-28 ENCOUNTER — Encounter: Payer: Managed Care, Other (non HMO) | Attending: Physician Assistant | Admitting: Physician Assistant

## 2019-10-28 DIAGNOSIS — I10 Essential (primary) hypertension: Secondary | ICD-10-CM | POA: Insufficient documentation

## 2019-10-28 DIAGNOSIS — E11621 Type 2 diabetes mellitus with foot ulcer: Secondary | ICD-10-CM | POA: Diagnosis not present

## 2019-10-28 DIAGNOSIS — L03115 Cellulitis of right lower limb: Secondary | ICD-10-CM | POA: Insufficient documentation

## 2019-10-28 DIAGNOSIS — L84 Corns and callosities: Secondary | ICD-10-CM | POA: Diagnosis not present

## 2019-10-28 DIAGNOSIS — L97512 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d: Secondary | ICD-10-CM | POA: Insufficient documentation

## 2019-10-28 DIAGNOSIS — Z89421 Acquired absence of other right toe(s): Secondary | ICD-10-CM | POA: Diagnosis not present

## 2019-10-30 NOTE — Progress Notes (Signed)
ALCIDES, NUTTING ([id number]) Visit Report for 10/28/2019 Chief Complaint Document Details Patient Name: [name], [name]. Date of Service: 10/28/2019 8:00 AM Medical Record Number: [id number] Patient Account Number: [id number] Date of Birth/Sex: [birth year] (54 y.o. M) Treating RN: Cornell Barman Primary Care Provider: Berneta Sages Other Clinician: Referring Provider: Berneta Sages Treating Provider/Extender: Skipper Cliche in Treatment: 43 Information Obtained from: Patient Chief Complaint Right lateral foot ulcer Electronic Signature(s) Signed: 10/28/2019 5:11:17 PM By: [name] PA-C Entered By: [name] on 10/28/2019 08:18:29 [name] ([id number]) -------------------------------------------------------------------------------- Debridement Details Patient Name: [name] Date of Service: 10/28/2019 8:00 AM Medical Record Number: [id number] Patient Account Number: [id number] Date of Birth/Sex: 06/27/1965 (54 y.o. M) Treating RN: Cornell Barman Primary Care Provider: Berneta Sages Other Clinician: Referring Provider: Berneta Sages Treating Provider/Extender: Skipper Cliche in Treatment: 32 Debridement Performed for Wound #4R Right,Lateral Foot Assessment: Performed By: Physician Tommie Sams., PA-C Debridement Type: Debridement Severity of Tissue Pre Debridement: Fat layer exposed Level of Consciousness (Pre- Awake and Alert procedure): Pre-procedure Verification/Time Out Yes - 08:35 Taken: Start Time: 08:35 Pain Control: Lidocaine Total Area Debrided (L x W): 0.4 (cm) x 0.8 (cm) = 0.32 (cm) Tissue and other material Viable, Non-Viable, Callus, Slough, Subcutaneous, Slough debrided: Level: Skin/Subcutaneous Tissue Debridement Description: Excisional Instrument: Curette Bleeding: Minimum Hemostasis Achieved: Pressure Response to Treatment: Procedure was tolerated well Level of Consciousness (Post- Awake and Alert procedure): Post  Debridement Measurements of Total Wound Length: (cm) 0.4 Width: (cm) 0.8 Depth: (cm) 0.2 Volume: (cm) 0.05 Character of Wound/Ulcer Post Debridement: Stable Severity of Tissue Post Debridement: Fat layer exposed Post Procedure Diagnosis Same as Pre-procedure Electronic Signature(s) Signed: 10/28/2019 5:11:17 PM By: [name] PA-C Signed: 10/29/2019 5:48:54 PM By: Gretta Cool, BSN, RN, CWS, Kim RN, BSN Entered By: Gretta Cool, BSN, RN, CWS, Kim on 10/28/2019 08:37:15 [name] ([id number]) -------------------------------------------------------------------------------- HPI Details Patient Name: [name] Date of Service: 10/28/2019 8:00 AM Medical Record Number: [id number] Patient Account Number: [id number] Date of Birth/Sex: [birth year] (54 y.o. M) Treating RN: Cornell Barman Primary Care Provider: Berneta Sages Other Clinician: Referring Provider: Berneta Sages Treating Provider/Extender: Skipper Cliche in Treatment: 32 History of Present Illness HPI Description: This 54 year old male comes with an ulcerated area on the plantar aspect of the right foot which she's had for approximately a month. I have known him from a previous visit at [facility] wound center and was treated in the months of April and May 2016 and rapidly healed a left plantar ulcer with a total contact cast. He has been a diabetic for about 16 years and tries to keep active and is fairly compliant with his diabetes management. 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 Past medical history significant for hypertension, hyperlipidemia, and status post appendectomy 1993. He does not smoke or drink alcohol. 01/14/2015 -- the patient had tolerated his total contact cast very well and had no problems and has had no systemic symptoms. However when his total contact cast was cut open he had excessive amount of purulent drainage in spite of being on antibiotics. He had had a recent x-ray done in the ER 12/22/2014  which showed IMPRESSION:No evidence of osseous erosion. Known soft tissue ulceration is not well characterized on radiograph. Scattered vascular calcifications seen. his last hemoglobin A1c in December was 7.3. He has been on Augmentin and doxycycline for the last 2 weeks. 01/21/2015 -- his culture grew rare growth of Pantoea species an MR moderate growth of Candida parapsilosis.  it is sensitive to levofloxacin. He has not heard back from the insurance company regarding his hyperbaric oxygen therapy. His MRI has not been done yet and we will try and get him an earlier date 01/28/2015 -- MRI was done last night -- IMPRESSION:1. Soft tissue ulcer overlying the plantar aspect of the fifth metatarsal head extending to the cortex. Subcortical marrow edema in the fifth metatarsal head with corresponding T1 hypointensity is concerning for early osteomyelitis of the plantar lateral aspect of the fifth metatarsal head. Chest x-ray done on 01/14/2015 shows bronchiectatic changes without infiltrate. EKG done on generally 17 2017 shows a normal sinus rhythm and is a normal EKG. 02/04/2015 -- he was asked to see Dr. [name] last week and had 2 appointments but had to cancel both due to pressures of work. Last night he has woken up with severe pain in the foot and leg and it is swollen up. No fever or no change in his blood glucose. Addendum: I spoke with Dr. [name] who kindly agreed to accept the patient for inpatient therapy and have also opened to the hospitalist Dr. [name], and discuss details of the management including PICC line and repeat cultures. 02/12/2015-- -- was seen by Dr. [name] in the hospital and a PICC line was placed. He was to receive Ceftazidime 2 g every 12 hourly, oral levofloxacin 750 mg every 24 hourly and oral fluconazole 200 mg daily. The antibiotics were to be given for 4 weeks except the Diflucan was to be given for the first 2 weeks. Reviewed note from 02/10/2015 -- and Dr.  [name] had recommended management for growth of MSSA and Serratia. He switched him from ceftazidime to ceftriaxone 2 g every 24 hours. Levofloxacin was stopped and he would continue on fluconazole for another week. He had asked me to decide whether further imaging was necessary and whether surgical debridement of the infected bone was needed. He is doing well and has been off work for this week and we will keep him off the next week. 02/22/2015 -- he was seen by my colleague on 01/19/2015 and at that time an incision and drainage was done on his right lateral forefoot on the dorsum. Today when I probed this wound it is frankly draining pus and it communicates with the ulcer on the plantar aspect of his right foot. The patient is still on IV ceftriaxone 2 g every 24 hours and is to be seen by Dr. [name] on Friday. 03/19/2015 -- On 03/04/2015 I spoke to Dr. 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 He recommended operative debridement which would probably result in the fifth metatarsal head and toe amputation.The patient would be referred back to [place] once he was done with surgery. He was admitted to [facility] yesterday and had surgery done by podiatry for a right fifth metatarsal acute osteomyelitis with cellulitis and abscess. He had a right foot incision and drainage with fifth metatarsal partial amputation and removal of toe infected bone and soft tissue with cultures. The wound was partially closed and packing of the distal end was done. Patient was already on cefepime 2 g IV every 8 hourly and put on vancomycin pending final cultures. He had grown moderate gram-negative rods, and later found to be rare diphtheroids. I received a call from Dr. Cannon Kettle the  podiatrist and we discussed the above. On 03/10/2015 he was found  positive for influenza a and has been put on Tamiflu. Since his discharge he has been seen by Dr. Cannon Kettle who is planning to remove his sutures this coming week. He was reviewed by Dr. [name] on 03/17/2015 and his Diflucan was stopped and vancomycin. After the 3 doses he is taking. He is going to change Ceftazidime to Zosyn 3.375 g IV every 8 hours. 03/25/2015 -- he was seen by the podiatrist a couple of days ago and the sutures were removed. She will follow back with him in 4 weeks' time and at that time x-rays will be taken and a custom molded insert would be made for his shoe. 04/01/2015 -- he was seen by Dr. [name] on 03/29/2015 who pulled the PICC line stopped his IV antibiotics and recommended starting doxycycline and levofloxacin for 2 weeks. He also stop the fluconazole. The patient will follow up with him only when necessary. Readmission: 03/15/17 on evaluation today patient presents for initial evaluation concerning the new issues although he has previously been evaluated in our clinic. Unfortunately the previous evaluation led to the patient having to proceed to amputation and so he is somewhat nervous about being here ABAD, MANARD ([id number]) today. With that being said he has a very slight blister that occurred on the plantar aspect more medial on the right great toe that has been present for just a very short amount of time, several days. With that being said he felt like initially when he called that he somewhat overreacted but due to the fact that his previous issue led to amputation he is very cautious these days I explained that he did the right thing. With that being said he has been tolerating the dressing changes without complication mainly he just been covering this. He does not have any discomfort in secondary to neuropathy it's unlikely that he feels much. With that being said he does tell me that the issue that he had here is that he went barefoot when he  knows he should not have which subsequently led to the blisters. He states is definitely not doing that anymore. His most recent hemoglobin A1c was December and registered 6.9 his ABI today was 1.1. 03/27/17 on evaluation today patient appears to be doing great in regard to his right great toe ulcer. He has been tolerating the dressing changes without complication. The good news is this is making excellent progress and he seems to be caring for this in an excellent fashion. I see no evidence of breakdown that would make me concerned that he was at risk for infection/amputation. This has obviously been his concern due to the fifth toe amputation that was necessitated previous when he had a similar issue. Nonetheless this seems to be progressing much more nicely. Readmission: 03/13/2019 upon evaluation today patient presents for reevaluation here in the clinic concerning issues he has been having with his right foot on the lateral portion at the proximal end of the metatarsal. Subsequently he tells me that this just opened up in the past couple of days or so and the erythema really began about 24 to 48 hours ago. Fortunately there is no signs of systemic infection. 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 That left him with this bony prominence that is where the wound is currently. There is erythema surrounding that does have me concerned about cellulitis. With that being said  he was noncompressible as far as ABIs are concerned I do think we can need to check into arterial studies at this point. Patient does have a history of hypertension along with diabetes mellitus type 2. He also tends to develop quite a bit of callus white often. 03/20/2019 upon evaluation today patient appears to be doing very well in regard to his wounds currently. He has been tolerating the dressing changes without complication. Fortunately there is no signs of infection. His ABIs  were good at this point and registered at 1.07 on the left and 1.09 on the right. In regard to the x-ray this was negative as well for any signs of osteomyelitis. The patient also seems to be doing better in regard to the infection. He still has several days of the antibiotic left at this point best the Bactrim DS and he seems to be doing very well with this. Overall I am extremely pleased with the progress in 1 week's time he is going require some sharp debridement today however. 03/27/2019 upon evaluation today patient actually appears to be making some progress in regard to his wound. It is a little deeper but measuring smaller which is good news due to the fact that again were clear away some of the necrotic tissue which is why the depth is increasing a little bit. Nonetheless after like were getting very close to being down at a good wound bed. Fortunately there is no signs of active infection at this time. There is a little bit of erythema immediately surrounding the wound that we do want to be very cognizant of and careful about. For that reason I am going to go ahead and extend his antibiotic today for an additional 10 days that is the Bactrim. 04/03/2019 upon evaluation today patient appears to be making some progress. I do feel like the Annitta Needs is doing a good job along with the alginate is being packed into the wound space behind. He has been tolerating the dressing changes without complication. Fortunately there is no signs of active infection at this time. No fevers, chills, nausea, vomiting, or diarrhea. 04/10/2019 upon evaluation today patient appears to be making progress. The wound is not quite as deep but he still does not have a great wound surface yet for working on this and the Santyl does seem to be cleaning things up. Fortunately there is no evidence of active infection at this time. No fevers, chills, nausea, vomiting, or diarrhea. 04/17/2019 upon evaluation today patient appears to  be doing excellent at this time in regard to his foot ulcer. I do believe the Santyl with the alginate packed in behind has been of benefit for him and I am very pleased in this regard. With that being said I am feeling like the wound surface is improving quite a bit each time I see him still I do not believe we are at the point of stating that the wound bed is perfect but again were making progress. The patient is going require some sharp debridement today. 4/22; this is a patient who is using Santyl with calcium alginate. Apparently his wound dimensions have been improving. Patient is changing the dressing himself. He has a surgical shoe. He works in a sitting position therefore is not on his feet all that much. There is undermining 05/01/2019 upon evaluation today patient appears to be doing a little better in regard to the size of his wound though he still has some depth. This does seem to be much  cleaner has been using Santyl on the base of the wound followed by packing with silver alginate and behind. Nonetheless I do believe that based on what we are seeing we may need to switch to a collagen type dressing endoform may actually be excellent for him. 05/08/2019 upon evaluation today patient's wound actually appears to be showing better granulation tissue in the base of the wound. With that being said he does have some epiboly noted around the edges of the wound especially plantar and more distal. Subsequently this is can require sharp debridement today. Fortunately there is no signs of active systemic infection though I do feel like there is some local cellulitis noted at this point. The patient also notes has had increased drainage. 5/13; wound volume quite a bit improved this week down to 3 mm. Patient states pain and drainage better. Culture from last week grew staph aureus [not MRSA] and this is sensitive to quinolones and he is on Levaquin. HOWEVER he also grew abundant Enterococcus faecalis  treatment of choice for this is ampicillin. Quinolone coverage is unreliable 5/20 completing the Augmentin. Small wound on the right lateral foot. Thick rolled edges around the wound. Using silver alginate 05/29/2019 upon evaluation today patient appears to be doing better compared to last time I saw him. Fortunately there is no signs of active infection and I do feel like he is actually making good progress. Overall the quality of the wound bed along with the size looks improved and the inflammation/erythema that was previously noted by myself when I saw the couple weeks back actually has also resolved and this is great. Overall very pleased with how things stand. 06/06/2019 upon evaluation today patient actually appears to be doing quite well with regard to his foot ulcer with regard to some of the granulation were seen at this point. There does not appear to be any evidence of systemic or local infection although he still has discomfort I feel like things are moving in a slow but surely correct direction. I think we may need to help fill some of the space however even though he is using the collagen we may need to have something behind this such as a plain packing strip to try to help hold the collagen in the base of the wound. 06/12/2019 upon evaluation today patient appears to be doing well with regard to his foot ulcer. This seems to be making some slow but steady progress. The wound does not appear to be as deep today compared to where it was previous. Fortunately there is no signs of active infection at this time. No fevers, chills, nausea, vomiting, or diarrhea. 06/19/2019 on evaluation today patient's wound does not appear to be doing quite as well as what I would like to see. Fortunately there is no signs of systemic infection at this time which is great news. With that being said I do believe that the patient needs to have a wound culture as I do OKLEY, MAGNUSSEN. ([id number]) believe he  likely has a local infection that needs to be addressed. He is also can require some debridement today as well. 06/26/2019 upon evaluation today patient's wound actually appears to be showing signs of excellent improvement today. There does not appear to be any evidence of active infection which is great news. There does not appear to be significant erythema at this point. The patient does have improvement overall in the appearance of the infection he has been taking the Augmentin the culture came back  showing no organism was grown at this point but nonetheless I do believe he has been doing well with the antibiotics I would continue that at this point. 07/03/2019 upon evaluation today patient appears to be doing about the same in regard to his wound. There does not appear to be any evidence of active infection and overall I am pleased with where things stand. The patient is tolerating the dressing changes without complication but it does not appear that they are packing the wound at this time which I think is going to slow down his healing prospects. 07/10/2019 upon evaluation today patient appears to be doing little better in my opinion in regard to the overall moisture collected in the base of the wound. They have been packing this with a little bit of silver alginate and that has done quite well. Overall I am pleased in that regard. Subsequently the redness does not appear to be any worse today I did actually go ahead and marked this since this can be 2 weeks between now and when I see him again I would make sure that if anything changes or worsens he will be able to notice this and go ahead and initiate treatment with the Augmentin. 07/22/2019 patient comes in today a little bit early for evaluation secondary to the fact that he was having issues with increased drainage and concern about infection. With that being said he does appear to have developed a blister area again proximal to where the original  wound opening was in although the wound bed itself actually appears to be doing somewhat better he has had increased drainage and tracking of fluid here due to this blister. Nonetheless I believe that that does need to be cleaned away today but again I am not really certain that I see a lot of evidence for infection at this point. 07/29/2019 on evaluation today patient actually appears to be doing better in regard to his foot ulcer. There is improvement compared to last week which is great news. Nonetheless he does come prepared to apply the total contact cast today which I think would be beneficial for him. He knows he is not supposed to drive he did bring his son with him today in order to drive him. 07/31/2019 upon evaluation today patient appears to be doing well with regard to his wound today. I do not see any signs of infection I do believe that he is definitely showing some signs of improvement here in the office in a couple days as far as the overall erythema and pressure getting to the wound area is concerned. With that being said I do believe that the cast has been of great benefit for him. 08/08/2019 upon evaluation today patient appears to be doing better in regard to his wound. Overall I feel like he is showing signs of improvement. The external measurement is really not bad at all although I did remove some of the callus in order to open up the wound itself the depth is actually coming quite well and I am extremely pleased in that regard I do not think we can to see much improvement externally until we get the internal depth filled in and that again is improved. Fortunately there is no signs of active infection at this time. No fevers, chills, nausea, vomiting, or diarrhea. 08/15/2019 on evaluation today patient actually appears to be doing well with regard to his foot ulcer. This is showing some signs of minimal improvement from the standpoint of the  depth and appearance of the wound as  well as the edges of the wound. It is taking its time and to be honest I do believe that he seems to be progressing well. I am happy with the cast and what it is achieving at this point. Have contemplated checking into Dermagraft for Apligraf for the patient but again he does seem to be making fairly good progress in my opinion. If it has not tremendously changed come next week I may consider one of the 2. 08/22/2019 on evaluation today patient appears to be making good progress with regard to his wound I am still very pleased with where things stand today. Overall I think that he is making good improvements week by week and I believe that we are getting continue with the Total contact cast I will likely switch to a silver alginate dressing. 08/29/2019 on evaluation today patient appears to be doing quite well with regard to his wound. Fortunately this is measuring quite small. There is no signs of active infection at this time although he did have a little bit of a callus area just to the side of his foot where we previously noted some fluid collecting underneath that was the case today as well. I did have to remove this. With that being said overall he seems to be doing quite well. 09/05/19 on evaluation today patient appears to be doing very well with regard to his original wound which is actually very small on evaluation today. With that being said he does have a blistered area that's more proximal to where this wound is that has been a little bit more concerned. Fortunately there is no signs of active infection at this time. I'm going to need to remove the blister tissue today. 10 09/12/2019 on evaluation today patient actually appears to be doing much better in regard to his wound. I think he is actually improved more without the cast that he was prior with the cast. Overall very pleased with where things stand today. 09/22/2019 upon evaluation today patient appears to be doing very well at this time  in regard to his foot ulcer. In fact I feel like he is continue to make good progress. I see no signs right now that there is any active infection which is great news. Also see very little evidence of pressure he does have some callus buildup but again this seems to be minimal. 09/29/2019 upon evaluation today patient appears to be doing well with regard to his foot ulcer. He has been tolerating the dressing changes without complication. Fortunately there is no signs of active infection at this time. No fevers, chills, nausea, vomiting, or diarrhea. 10/28/2019 upon evaluation today patient appears to be doing a little bit more poorly than last time I saw him as last time we assumed he was completely healed. Apparently that was not the case or else this is reopened but I tend to believe that this probably has never completely closed internally though it appeared to be. He has been using the offloading felt which seems to have done well there is no signs of redness no evidence of erythema all of which is good news. Nonetheless I am concerned about the fact that he is having issues here with what appears to be a reopening of the wound. Electronic Signature(s) Signed: 10/28/2019 9:14:26 AM By: [name] PA-C Entered By: [name] on 10/28/2019 09:14:26 [name] ([id number]) -------------------------------------------------------------------------------- Physical Exam Details Patient Name: [name], [name]  Date of Service: 10/28/2019 8:00 AM Medical Record Number: [id number] Patient Account Number: [id number] Date of Birth/Sex: 10/16/1965 (54 y.o. M) Treating RN: Cornell Barman Primary Care Provider: Berneta Sages Other Clinician: Referring Provider: Berneta Sages Treating Provider/Extender: Skipper Cliche in Treatment: 13 Constitutional Well-nourished and well-hydrated in no acute distress. Respiratory normal breathing without difficulty. Psychiatric this patient is able  to make decisions and demonstrates good insight into disease process. Alert and Oriented x 3. pleasant and cooperative. Notes Upon inspection patient's wound bed actually showed signs of good granulation at this time. There does not appear to be evidence of active infection which is great news. Overall I am extremely pleased with where things stand. I did have to perform some sharp debridement and noted that although the wound is open this appears to be fairly healthy. I think that he may have just covered over with some callus that I was unaware of still had an opening underneath and subsequently that has reappeared at this point. I do believe however that he still in pretty good shape as far as I am concerned. Electronic Signature(s) Signed: 10/28/2019 9:15:30 AM By: [name] PA-C Entered By: [name] on 10/28/2019 09:15:29 [name] ([id number]) -------------------------------------------------------------------------------- Physician Orders Details Patient Name: [name] Date of Service: 10/28/2019 8:00 AM Medical Record Number: [id number] Patient Account Number: [id number] Date of Birth/Sex: 09/19/1965 (54 y.o. 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 - in office o Dial antibacterial soap, wash wounds, rinse and pat dry prior to dressing wounds Anesthetic (add to Medication  List) Wound #4R Right,Lateral Foot o Topical Lidocaine 4% cream applied to wound bed prior to debridement (In Clinic Only). Primary Wound Dressing Wound #4R Right,Lateral Foot o Gentamicin Sulfate Cream o Silver Alginate - Thin layer of gentamicin under the alginate Secondary Dressing Wound #4R Right,Lateral Foot o Conform/Kerlix - Offloading felt donut, dry gauze, roll gauze to cover Dressing Change Frequency Wound #4R Right,Lateral Foot o Change Dressing Monday, Wednesday, Friday Follow-up Appointments Wound #4R Right,Lateral Foot o Return Appointment in 1 week. Electronic Signature(s) Signed: 10/28/2019 5:11:17 PM By: [name] PA-C Signed: 10/29/2019 5:48:54 PM By: Gretta Cool, BSN, RN, CWS, Kim RN, BSN Entered By: Gretta Cool, BSN, RN, CWS, Kim on 10/28/2019 08:38:14 [name] ([id number]) -------------------------------------------------------------------------------- Problem List Details Patient Name: [name], [name]. Date of Service: 10/28/2019 8:00 AM Medical Record Number: [id number] Patient Account Number: [id number] Date of Birth/Sex: 12-12-1965 (54 y.o. 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 M) Treating RN: Cornell Barman Primary Care Provider: Berneta Sages Other Clinician: Referring Provider: Berneta Sages Treating Provider/Extender: Skipper Cliche in Treatment: 32 Subjective Chief Complaint Information obtained from Patient Right lateral foot ulcer History of Present Illness (HPI) This 54 year old male comes with an ulcerated area on the plantar aspect of the right foot which she's had for approximately a month. I have known him from a previous visit at [facility] and was treated in the months of April and May 2016 and rapidly healed a left plantar ulcer with a total contact cast. He has been a diabetic for about 16 years and tries to keep active and is fairly compliant with his diabetes management. 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 Past medical history significant for hypertension, hyperlipidemia, and status post appendectomy 1993. He does not smoke or drink alcohol. 01/14/2015 -- the patient had tolerated his total contact cast very well and had no problems and has had no systemic symptoms. However when his total contact cast was cut open he had excessive amount of purulent drainage in spite of being on antibiotics. He had had a recent x-ray done in the ER 12/22/2014 which showed IMPRESSION:No evidence of osseous erosion. Known soft tissue ulceration is not well characterized on radiograph. Scattered vascular calcifications seen. his last hemoglobin A1c in December was 7.3. He has been on Augmentin and doxycycline for the last 2 weeks. 01/21/2015 -- his culture grew rare growth of Pantoea species an MR moderate growth of Candida  parapsilosis. it is sensitive to levofloxacin. He has not heard back from the insurance company regarding his hyperbaric oxygen therapy. His MRI has not been done yet and we will try and get him an earlier date 01/28/2015 -- MRI was done last night -- IMPRESSION:1. Soft tissue ulcer overlying the plantar aspect of the fifth metatarsal head extending to the cortex. Subcortical marrow edema in the fifth metatarsal head with corresponding T1 hypointensity is concerning for early osteomyelitis of the plantar lateral aspect of the fifth metatarsal head. Chest x-ray done on 01/14/2015 shows bronchiectatic changes without infiltrate. EKG done on generally 17 2017 shows a normal sinus rhythm and is a normal EKG. 02/04/2015 -- he was asked to see Dr. [name] last week and had 2 appointments but had to cancel both due to pressures of work. Last night he has woken up with severe pain in the foot and leg and it is swollen up. No fever or no change in his blood glucose. Addendum: I spoke with Dr. [name] who kindly agreed to accept the patient for inpatient therapy and have also opened to the hospitalist Dr. [name], and discuss details of the management including PICC line and repeat cultures. 02/12/2015-- -- was seen by Dr. [name] in the hospital and a PICC line was placed. He was to receive Ceftazidime 2 g every 12 hourly, oral levofloxacin 750 mg every 24 hourly and oral fluconazole 200 mg daily. The antibiotics were to be given for 4 weeks except the Diflucan was to be given for the first 2 weeks. Reviewed note from 02/10/2015 -- and Dr. [name] had recommended management for growth of MSSA and Serratia. He switched him from  ceftazidime to ceftriaxone 2 g every 24 hours. Levofloxacin was stopped and he would continue on fluconazole for another week. He had asked me to decide whether further imaging was necessary and whether surgical debridement of the infected bone was needed. He is doing well and  has been off work for this week and we will keep him off the next week. 02/22/2015 -- he was seen by my colleague on 01/19/2015 and at that time an incision and drainage was done on his right lateral forefoot on the dorsum. Today when I probed this wound it is frankly draining pus and it communicates with the ulcer on the plantar aspect of his right foot. The patient is still on IV ceftriaxone 2 g every 24 hours and is to be seen by Dr. [name] on Friday. 03/19/2015 -- On 03/04/2015 I spoke to Dr. 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 He recommended operative debridement which would probably result in the fifth metatarsal head and toe amputation.The patient would be referred back to [place] once he was done with surgery. He was admitted to [facility] yesterday and had surgery done by podiatry for a right fifth metatarsal acute osteomyelitis with cellulitis and abscess. He had a right foot incision and drainage with fifth metatarsal partial amputation and removal of toe infected bone and soft tissue with cultures. The wound was partially closed and packing of the distal end was done. Patient was already on cefepime 2 g IV every 8 hourly and put on vancomycin pending final cultures. He had grown moderate gram-negative rods, and later found to be rare diphtheroids. I received a call from Dr. Cannon Kettle the podiatrist and we discussed the above. On 03/10/2015 he was found positive for influenza a and has been put on Tamiflu. Since his discharge he has been seen by Dr. Cannon Kettle who is planning to remove his sutures this coming week. He was reviewed by Dr. [name] on 03/17/2015 and his Diflucan was stopped and vancomycin. After the 3 doses he is taking. He is going to change Ceftazidime to Zosyn 3.375 g IV every 8 hours. 03/25/2015 -- he  was seen by the podiatrist a couple of days ago and the sutures were removed. She will follow back with him in 4 weeks' time and at that time x-rays will be taken and a custom molded insert would be made for his shoe. 04/01/2015 -- he was seen by Dr. [name] on 03/29/2015 who pulled the PICC line stopped his IV antibiotics and recommended starting doxycycline and levofloxacin for 2 weeks. He also stop the fluconazole. The patient will follow up with him only when necessary. CORGAN, MORMILE ([id number]) Readmission: 03/15/17 on evaluation today patient presents for initial evaluation concerning the new issues although he has previously been evaluated in our clinic. Unfortunately the previous evaluation led to the patient having to proceed to amputation and so he is somewhat nervous about being here today. With that being said he has a very slight blister that occurred on the plantar aspect more medial on the right great toe that has been present for just a very short amount of time, several days. With that being said he felt like initially when he called that he somewhat overreacted but due to the fact that his previous issue led to amputation he is very  cautious these days I explained that he did the right thing. With that being said he has been tolerating the dressing changes without complication mainly he just been covering this. He does not have any discomfort in secondary to neuropathy it's unlikely that he feels much. With that being said he does tell me that the issue that he had here is that he went barefoot when he knows he should not have which subsequently led to the blisters. He states is definitely not doing that anymore. His most recent hemoglobin A1c was December and registered 6.9 his ABI today was 1.1. 03/27/17 on evaluation today patient appears to be doing great in regard to his right great toe ulcer. He has been tolerating the dressing changes without complication. The good  news is this is making excellent progress and he seems to be caring for this in an excellent fashion. I see no evidence of breakdown that would make me concerned that he was at risk for infection/amputation. This has obviously been his concern due to the fifth toe amputation that was necessitated previous when he had a similar issue. Nonetheless this seems to be progressing much more nicely. Readmission: 03/13/2019 upon evaluation today patient presents for reevaluation here in the clinic concerning issues he has been having with his right foot on the lateral portion at the proximal end of the metatarsal. Subsequently he tells me that this just opened up in the past couple of days or so and the erythema really began about 24 to 48 hours ago. Fortunately there is no signs of systemic infection. 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 That left him with this bony prominence that is where the wound is currently. There is erythema surrounding that does have me concerned about cellulitis. With that being said he was noncompressible as far as ABIs are concerned I do think we can need to check into arterial studies at this point. Patient does have a history of hypertension along with diabetes mellitus type 2. He also tends to develop quite a bit of callus white often. 03/20/2019 upon evaluation today patient appears to be doing very well in regard to his wounds currently. He has been tolerating the dressing changes without complication. Fortunately there is no signs of infection. His ABIs were good at this point and registered at 1.07 on the left and 1.09 on the right. In regard to the x-ray this was negative as well for any signs of osteomyelitis. The patient also seems to be doing better in regard to the infection. He still has several days of the antibiotic left at this point best the Bactrim DS and he seems to be doing very well with this. Overall I am  extremely pleased with the progress in 1 week's time he is going require some sharp debridement today however. 03/27/2019 upon evaluation today patient actually appears to be making some progress in regard to his wound. It is a little deeper but measuring smaller which is good news due to the fact that again were clear away some of the necrotic tissue which is why the depth is increasing a little bit. Nonetheless after like were getting very close to being down at a good wound bed. Fortunately there is no signs of active infection at this time. There is a little bit of erythema immediately surrounding the wound that we do want to be very cognizant of and careful about. For that reason I am going to  go ahead and extend his antibiotic today for an additional 10 days that is the Bactrim. 04/03/2019 upon evaluation today patient appears to be making some progress. I do feel like the Annitta Needs is doing a good job along with the alginate is being packed into the wound space behind. He has been tolerating the dressing changes without complication. Fortunately there is no signs of active infection at this time. No fevers, chills, nausea, vomiting, or diarrhea. 04/10/2019 upon evaluation today patient appears to be making progress. The wound is not quite as deep but he still does not have a great wound surface yet for working on this and the Santyl does seem to be cleaning things up. Fortunately there is no evidence of active infection at this time. No fevers, chills, nausea, vomiting, or diarrhea. 04/17/2019 upon evaluation today patient appears to be doing excellent at this time in regard to his foot ulcer. I do believe the Santyl with the alginate packed in behind has been of benefit for him and I am very pleased in this regard. With that being said I am feeling like the wound surface is improving quite a bit each time I see him still I do not believe we are at the point of stating that the wound bed is perfect but  again were making progress. The patient is going require some sharp debridement today. 4/22; this is a patient who is using Santyl with calcium alginate. Apparently his wound dimensions have been improving. Patient is changing the dressing himself. He has a surgical shoe. He works in a sitting position therefore is not on his feet all that much. There is undermining 05/01/2019 upon evaluation today patient appears to be doing a little better in regard to the size of his wound though he still has some depth. This does seem to be much cleaner has been using Santyl on the base of the wound followed by packing with silver alginate and behind. Nonetheless I do believe that based on what we are seeing we may need to switch to a collagen type dressing endoform may actually be excellent for him. 05/08/2019 upon evaluation today patient's wound actually appears to be showing better granulation tissue in the base of the wound. With that being said he does have some epiboly noted around the edges of the wound especially plantar and more distal. Subsequently this is can require sharp debridement today. Fortunately there is no signs of active systemic infection though I do feel like there is some local cellulitis noted at this point. The patient also notes has had increased drainage. 5/13; wound volume quite a bit improved this week down to 3 mm. Patient states pain and drainage better. Culture from last week grew staph aureus [not MRSA] and this is sensitive to quinolones and he is on Levaquin. HOWEVER he also grew abundant Enterococcus faecalis treatment of choice for this is ampicillin. Quinolone coverage is unreliable 5/20 completing the Augmentin. Small wound on the right lateral foot. Thick rolled edges around the wound. Using silver alginate 05/29/2019 upon evaluation today patient appears to be doing better compared to last time I saw him. Fortunately there is no signs of active infection and I do feel like  he is actually making good progress. Overall the quality of the wound bed along with the size looks improved and the inflammation/erythema that was previously noted by myself when I saw the couple weeks back actually has also resolved and this is great. Overall very pleased with how things stand.  06/06/2019 upon evaluation today patient actually appears to be doing quite well with regard to his foot ulcer with regard to some of the granulation were seen at this point. There does not appear to be any evidence of systemic or local infection although he still has discomfort I feel like things are moving in a slow but surely correct direction. I think we may need to help fill some of the space however even though he is using the collagen we may need to have something behind this such as a plain packing strip to try to help hold the collagen in the base of the wound. 06/12/2019 upon evaluation today patient appears to be doing well with regard to his foot ulcer. This seems to be making some slow but steady progress. The wound does not appear to be as deep today compared to where it was previous. Fortunately there is no signs of active infection at AMBROSIO, REUTER. ([id number]) this time. No fevers, chills, nausea, vomiting, or diarrhea. 06/19/2019 on evaluation today patient's wound does not appear to be doing quite as well as what I would like to see. Fortunately there is no signs of systemic infection at this time which is great news. With that being said I do believe that the patient needs to have a wound culture as I do believe he likely has a local infection that needs to be addressed. He is also can require some debridement today as well. 06/26/2019 upon evaluation today patient's wound actually appears to be showing signs of excellent improvement today. There does not appear to be any evidence of active infection which is great news. There does not appear to be significant erythema at this point. The  patient does have improvement overall in the appearance of the infection he has been taking the Augmentin the culture came back showing no organism was grown at this point but nonetheless I do believe he has been doing well with the antibiotics I would continue that at this point. 07/03/2019 upon evaluation today patient appears to be doing about the same in regard to his wound. There does not appear to be any evidence of active infection and overall I am pleased with where things stand. The patient is tolerating the dressing changes without complication but it does not appear that they are packing the wound at this time which I think is going to slow down his healing prospects. 07/10/2019 upon evaluation today patient appears to be doing little better in my opinion in regard to the overall moisture collected in the base of the wound. They have been packing this with a little bit of silver alginate and that has done quite well. Overall I am pleased in that regard. Subsequently the redness does not appear to be any worse today I did actually go ahead and marked this since this can be 2 weeks between now and when I see him again I would make sure that if anything changes or worsens he will be able to notice this and go ahead and initiate treatment with the Augmentin. 07/22/2019 patient comes in today a little bit early for evaluation secondary to the fact that he was having issues with increased drainage and concern about infection. With that being said he does appear to have developed a blister area again proximal to where the original wound opening was in although the wound bed itself actually appears to be doing somewhat better he has had increased drainage and tracking of fluid here due to this  blister. Nonetheless I believe that that does need to be cleaned away today but again I am not really certain that I see a lot of evidence for infection at this point. 07/29/2019 on evaluation today patient  actually appears to be doing better in regard to his foot ulcer. There is improvement compared to last week which is great news. Nonetheless he does come prepared to apply the total contact cast today which I think would be beneficial for him. He knows he is not supposed to drive he did bring his son with him today in order to drive him. 07/31/2019 upon evaluation today patient appears to be doing well with regard to his wound today. I do not see any signs of infection I do believe that he is definitely showing some signs of improvement here in the office in a couple days as far as the overall erythema and pressure getting to the wound area is concerned. With that being said I do believe that the cast has been of great benefit for him. 08/08/2019 upon evaluation today patient appears to be doing better in regard to his wound. Overall I feel like he is showing signs of improvement. The external measurement is really not bad at all although I did remove some of the callus in order to open up the wound itself the depth is actually coming quite well and I am extremely pleased in that regard I do not think we can to see much improvement externally until we get the internal depth filled in and that again is improved. Fortunately there is no signs of active infection at this time. No fevers, chills, nausea, vomiting, or diarrhea. 08/15/2019 on evaluation today patient actually appears to be doing well with regard to his foot ulcer. This is showing some signs of minimal improvement from the standpoint of the depth and appearance of the wound as well as the edges of the wound. It is taking its time and to be honest I do believe that he seems to be progressing well. I am happy with the cast and what it is achieving at this point. Have contemplated checking into Dermagraft for Apligraf for the patient but again he does seem to be making fairly good progress in my opinion. If it has not tremendously changed come next  week I may consider one of the 2. 08/22/2019 on evaluation today patient appears to be making good progress with regard to his wound I am still very pleased with where things stand today. Overall I think that he is making good improvements week by week and I believe that we are getting continue with the Total contact cast I will likely switch to a silver alginate dressing. 08/29/2019 on evaluation today patient appears to be doing quite well with regard to his wound. Fortunately this is measuring quite small. There is no signs of active infection at this time although he did have a little bit of a callus area just to the side of his foot where we previously noted some fluid collecting underneath that was the case today as well. I did have to remove this. With that being said overall he seems to be doing quite well. 09/05/19 on evaluation today patient appears to be doing very well with regard to his original wound which is actually very small on evaluation today. With that being said he does have a blistered area that's more proximal to where this wound is that has been a little bit more concerned. Fortunately there is  no signs of active infection at this time. I'm going to need to remove the blister tissue today. 10 09/12/2019 on evaluation today patient actually appears to be doing much better in regard to his wound. I think he is actually improved more without the cast that he was prior with the cast. Overall very pleased with where things stand today. 09/22/2019 upon evaluation today patient appears to be doing very well at this time in regard to his foot ulcer. In fact I feel like he is continue to make good progress. I see no signs right now that there is any active infection which is great news. Also see very little evidence of pressure he does have some callus buildup but again this seems to be minimal. 09/29/2019 upon evaluation today patient appears to be doing well with regard to his foot ulcer.  He has been tolerating the dressing changes without complication. Fortunately there is no signs of active infection at this time. No fevers, chills, nausea, vomiting, or diarrhea. 10/28/2019 upon evaluation today patient appears to be doing a little bit more poorly than last time I saw him as last time we assumed he was completely healed. Apparently that was not the case or else this is reopened but I tend to believe that this probably has never completely closed internally though it appeared to be. He has been using the offloading felt which seems to have done well there is no signs of redness no evidence of erythema all of which is good news. Nonetheless I am concerned about the fact that he is having issues here with what appears to be a reopening of the wound. [name], [name] ([id number]) Objective Constitutional Well-nourished and well-hydrated in no acute distress. Vitals Time Taken: 8:08 AM, Height: 74 in, Weight: 308 lbs, BMI: 39.5, Temperature: 98.6 F, Pulse: 76 bpm, Respiratory Rate: 16 breaths/min, Blood Pressure: 153/81 mmHg, Capillary Blood Glucose: 163 mg/dl. General Notes: glucose per pt report this am Respiratory normal breathing without difficulty. Psychiatric this patient is able to make decisions and demonstrates good insight into disease process. Alert and Oriented x 3. pleasant and cooperative. General Notes: Upon inspection patient's wound bed actually showed signs of good granulation at this time. There does not appear to be evidence of active infection which is great news. Overall I am extremely pleased with where things stand. I did have to perform some sharp debridement and noted that although the wound is open this appears to be fairly healthy. I think that he may have just covered over with some callus that I was unaware of still had an opening underneath and subsequently that has reappeared at this point. I do believe however that he still in pretty good  shape as far as I am concerned. Integumentary (Hair, Skin) Wound #4R status is Open. Original cause of wound was Gradually Appeared. The wound is located on the Right,Lateral Foot. The wound measures 0.4cm length x 0.8cm width x 0.4cm depth; 0.251cm^2 area and 0.101cm^3 volume. There is Fat Layer (Subcutaneous Tissue) exposed. There is no tunneling noted, however, there is undermining starting at 6:00 and ending at 12:00 with a maximum distance of 0.7cm. There is a small amount of serosanguineous drainage noted. The wound margin is flat and intact. There is large (67-100%) red granulation within the wound bed. There is no necrotic tissue within the wound bed. Assessment Active Problems ICD-10 Type 2 diabetes mellitus with foot ulcer 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d  Cellulitis of right lower limb Corns and callosities Essential (primary) hypertension Acquired absence of other left toe(s) Procedures Wound #4R Pre-procedure diagnosis of Wound #4R is a Diabetic Wound/Ulcer of the Lower Extremity located on the Right,Lateral Foot .Severity of Tissue Pre Debridement is: Fat layer exposed. There was a Excisional Skin/Subcutaneous Tissue Debridement with a total area of 0.32 sq cm performed by Tommie Sams., PA-C. With the following instrument(s): Curette to remove Viable and Non-Viable tissue/material. Material removed includes Callus, Subcutaneous Tissue, and Slough after achieving pain control using Lidocaine. No specimens were taken. A time out was conducted at 08:35, prior to the start of the procedure. A Minimum amount of bleeding was controlled with Pressure. The procedure was tolerated well. Post Debridement Measurements: 0.4cm length x 0.8cm width x 0.2cm depth; 0.05cm^3 volume. Character of Wound/Ulcer Post Debridement is stable. Severity of Tissue Post Debridement is: Fat layer exposed. Post procedure Diagnosis Wound #4R: Same as  Pre-Procedure Plan Wound Cleansing: Wound #4R Right,Lateral Foot: [name], [name] ([id number]) Clean wound with Normal Saline. - in office Dial antibacterial soap, wash wounds, rinse and pat dry prior to dressing wounds Anesthetic (add to Medication List): Wound #4R Right,Lateral Foot: Topical Lidocaine 4% cream applied to wound bed prior to debridement (In Clinic Only). Primary Wound Dressing: Wound #4R Right,Lateral Foot: Gentamicin Sulfate Cream Silver Alginate - Thin layer of gentamicin under the alginate Secondary Dressing: Wound #4R Right,Lateral Foot: Conform/Kerlix - Offloading felt donut, dry gauze, roll gauze to cover Dressing Change Frequency: Wound #4R Right,Lateral Foot: Change Dressing Monday, Wednesday, Friday Follow-up Appointments: Wound #4R Right,Lateral Foot: Return Appointment in 1 week. 1. I would recommend currently that we go ahead and initiate treatment with a continuation of the wound care measures specifically with regard to a silver alginate dressing with gentamicin underneath I think that is doing very well for him. 2. I am also can recommend at this time that the patient continue to monitor for any signs of worsening or initiation of infection if anything changes in this regard he should let me know ASAP. 3. I am also can I suggest the patient should continue to offload is much as possible I recommend an offloading shoe for him in order to continue this he has one at home that he can use. We will see patient back for reevaluation in 1 week here in the clinic. If anything worsens or changes patient will contact our office for additional recommendations. Electronic Signature(s) Signed: 10/28/2019 9:16:23 AM By: [name] PA-C Entered By: [name] on 10/28/2019 09:16:23 [name] ([id number]) -------------------------------------------------------------------------------- SuperBill Details Patient Name: [name] Date of Service: 10/28/2019 Medical Record Number: [id number] Patient Account Number: [id number] Date of Birth/Sex: 06-29-1965 (54 y.o. M) Treating RN: Cornell Barman Primary Care Provider: Berneta Sages Other Clinician: Referring Provider: Berneta Sages Treating Provider/Extender: Skipper Cliche in Treatment: 32 Diagnosis Coding ICD-10 Codes Code Description E11.621 Type 2 diabetes mellitus with foot ulcer L97.512 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d L03.115 Cellulitis of right lower limb L84 Corns and callosities I10 Essential (primary) hypertension Z89.422 Acquired absence of other left toe(s) Facility Procedures CPT4 Code: 38756433 Description: 11042 - DEB SUBQ TISSUE 20 SQ CM/< Modifier: Quantity: 1 CPT4 Code: Description: ICD-10 Diagnosis Description L97.512 Non-pressure chronic ulcer of other part of right foot with fat layer ex Modifier: posed Quantity: Physician Procedures CPT4 Code: 2951884 Description: 11042 - WC PHYS SUBQ TISS 20 SQ CM Modifier: Quantity: 1  CPT4 Code: Description: ICD-10 Diagnosis Description Y50.354 Non-pressure chronic ulcer of other part of right foot with fat layer ex Modifier: posed Quantity: Electronic Signature(s) Signed: 10/28/2019 9:16:50 AM By: [name] PA-C Entered By: [name] on 10/28/2019 09:16:48

## 2019-10-30 NOTE — Progress Notes (Signed)
DARROLL, BREDESON ([id number]) Visit Report for 10/28/2019 Arrival Information Details Patient Name: [name], [name]. Date of Service: 10/28/2019 8:00 AM Medical Record Number: [id number] Patient Account Number: [id number] Date of Birth/Sex: 1965-02-19 (54 y.o. M) Treating RN: Cornell Barman Primary Care Bernyce Brimley: Berneta Sages Other Clinician: Referring Charlese Gruetzmacher: Berneta Sages Treating Elisheva Fallas/Extender: Skipper Cliche in Treatment: 36 Visit Information History Since Last Visit Added or deleted any medications: No Patient Arrived: Ambulatory Any new allergies or adverse reactions: No Arrival Time: 08:07 Had a fall or experienced change in No Accompanied By: self activities of daily living that may affect Transfer Assistance: None risk of falls: Patient Identification Verified: Yes Signs or symptoms of abuse/neglect since last visito No Secondary Verification Process Completed: Yes Hospitalized since last visit: No Patient Requires Transmission-Based Precautions: No Implantable device outside of the clinic excluding No Patient Has Alerts: Yes cellular tissue based products placed in the center Patient Alerts: DMII since last visit: Has Dressing in Place as Prescribed: Yes Pain Present Now: No Electronic Signature(s) Signed: 10/28/2019 4:48:14 PM By: Baruch Gouty RN, BSN Entered By: Baruch Gouty on 10/28/2019 08:14:32 [name] ([id number]) -------------------------------------------------------------------------------- Encounter Discharge Information Details Patient Name: [name] Date of Service: 10/28/2019 8:00 AM Medical Record Number: [id number] Patient Account Number: [id number] Date of Birth/Sex: 1965-01-27 (54 y.o. M) Treating RN: Cornell Barman Primary Care Mariah Harn: Berneta Sages Other Clinician: Referring Jessaca Philippi: Berneta Sages Treating Jalayiah Bibian/Extender: Skipper Cliche in Treatment: 42 Encounter Discharge Information Items Post Procedure  Vitals Discharge Condition: Stable Temperature (F): 98.6 Ambulatory Status: Ambulatory Pulse (bpm): 76 Discharge Destination: Home Respiratory Rate (breaths/min): 16 Transportation: Private Auto Blood Pressure (mmHg): 153/81 Accompanied By: self Schedule Follow-up Appointment: Yes Clinical Summary of Care: Electronic Signature(s) Signed: 10/29/2019 5:48:54 PM By: Gretta Cool, BSN, RN, CWS, Kim RN, BSN Entered By: Gretta Cool, BSN, RN, CWS, Kim on 10/28/2019 08:49:14 [name] ([id number]) -------------------------------------------------------------------------------- Lower Extremity Assessment Details Patient Name: [name], [name]. Date of Service: 10/28/2019 8:00 AM Medical Record Number: [id number] Patient Account Number: [id number] Date of Birth/Sex: 1965-09-15 (54 y.o. M) Treating RN: Cornell Barman Primary Care Donelda Mailhot: Berneta Sages Other Clinician: Referring Oreste Majeed: Berneta Sages Treating Mikeisha Lemonds/Extender: Jeri Cos Weeks in Treatment: 32 Edema Assessment Assessed: [Left: No] [Right: No] Edema: [Left: N] [Right: o] Vascular Assessment Pulses: Dorsalis Pedis Palpable: [Right:Yes] Electronic Signature(s) Signed: 10/28/2019 4:48:14 PM By: Baruch Gouty RN, BSN Signed: 10/29/2019 5:48:54 PM By: Gretta Cool, BSN, RN, CWS, Kim RN, BSN Entered By: Baruch Gouty on 10/28/2019 08:27:37 [name], [name] ([id number]) -------------------------------------------------------------------------------- Multi Wound Chart Details Patient Name: [name], [name]. Date of Service: 10/28/2019 8:00 AM Medical Record Number: [id number] Patient Account Number: [id number] Date of Birth/Sex: 1965/03/23 (54 y.o. M) Treating RN: Cornell Barman Primary Care Edel Rivero: Berneta Sages Other Clinician: Referring Adelle Zachar: Berneta Sages Treating Jasma Seevers/Extender: Skipper Cliche in Treatment: 32 Vital Signs Height(in): 74 Capillary Blood Glucose 163 (mg/dl): Weight(lbs): 308 Pulse(bpm):  76 Body Mass Index(BMI): 40 Blood Pressure(mmHg): 153/81 Temperature(F): 98.6 Respiratory Rate(breaths/min): 16 Photos: [4R:No Photos] [N/A:N/A] Wound Location: [4R:Right, Lateral Foot] [N/A:N/A] Wounding Event: [4R:Gradually Appeared] [N/A:N/A] Primary Etiology: [4R:Diabetic Wound/Ulcer of the Lower Extremity] [N/A:N/A] Comorbid History: [4R:Hypertension, Type II Diabetes, Neuropathy] [N/A:N/A] Date Acquired: [4R:03/12/2019] [N/A:N/A] Weeks of Treatment: [4R:32] [N/A:N/A] Wound Status: [4R:Open] [N/A:N/A] Wound Recurrence: [4R:Yes] [N/A:N/A] Pending Amputation on [4R:Yes] [N/A:N/A] Presentation: Measurements L x W x D (cm) [4R:0.4x0.8x0.4] [N/A:N/A] Area (cm) : [4R:0.251] [N/A:N/A] Volume (cm) : [4R:0.101] [N/A:N/A] % Reduction in Area: [4R:60.50%] [N/A:N/A] % Reduction in Volume: [4R:20.50%] [N/A:N/A] Starting Position 1 (o'clock): [  4R:6] Ending Position 1 (o'clock): [4R:12] Maximum Distance 1 (cm): [4R:0.7] Undermining: [4R:Yes] [N/A:N/A] Classification: [4R:Grade 1] [N/A:N/A] Exudate Amount: [4R:Small] [N/A:N/A] Exudate Type: [4R:Serosanguineous] [N/A:N/A] Exudate Color: [4R:red, brown] [N/A:N/A] Wound Margin: [4R:Flat and Intact] [N/A:N/A] Granulation Amount: [4R:Large (67-100%)] [N/A:N/A] Granulation Quality: [4R:Red] [N/A:N/A] Necrotic Amount: [4R:None Present (0%)] [N/A:N/A] Exposed Structures: [4R:Fat Layer (Subcutaneous Tissue): Yes Fascia: No Tendon: No Muscle: No Joint: No Bone: No Small (1-33%)] [N/A:N/A N/A] Treatment Notes Electronic Signature(s) Signed: 10/29/2019 5:48:54 PM By: Gretta Cool, BSN, RN, CWS, Kim RN, BSN Entered By: Gretta Cool, BSN, RN, CWS, Kim on 10/28/2019 08:35:31 [name] ([id number]) -------------------------------------------------------------------------------- [place] Details Patient Name: [name], [name]. Date of Service: 10/28/2019 8:00 AM Medical Record Number: [id number] Patient Account Number:  [id number] Date of Birth/Sex: 05/30/1965 (54 y.o. M) Treating RN: Cornell Barman Primary Care Kaori Jumper: Berneta Sages Other Clinician: Referring Genesi Stefanko: Berneta Sages Treating Leroi Haque/Extender: Skipper Cliche in Treatment: 32 Active Inactive Nutrition Nursing Diagnoses: Impaired glucose control: actual or potential Goals: Patient/caregiver verbalizes understanding of need to maintain therapeutic glucose control per primary care physician Date Initiated: 03/13/2019 Target Resolution Date: 04/11/2019 Goal Status: Active Interventions: Assess patient nutrition upon admission and as needed per policy Notes: Orientation to the Wound Care Program Nursing Diagnoses: Knowledge deficit related to the wound healing center program Goals: Patient/caregiver will verbalize understanding of the [place] Date Initiated: 03/13/2019 Target Resolution Date: 04/11/2019 Goal Status: Active Interventions: Provide education on orientation to the wound center Notes: Wound/Skin Impairment Nursing Diagnoses: Impaired tissue integrity Goals: Ulcer/skin breakdown will have a volume reduction of 30% by week 4 Date Initiated: 03/13/2019 Target Resolution Date: 04/11/2019 Goal Status: Active Interventions: Assess ulceration(s) every visit Notes: Electronic Signature(s) Signed: 10/29/2019 5:48:54 PM By: Gretta Cool, BSN, RN, CWS, Kim RN, BSN Entered By: Gretta Cool, BSN, RN, CWS, Kim on 10/28/2019 08:35:15 [name] ([id number]) -------------------------------------------------------------------------------- Pain Assessment Details Patient Name: [name] Date of Service: 10/28/2019 8:00 AM Medical Record Number: [id number] Patient Account Number: [id number] Date of Birth/Sex: 1965/06/16 (54 y.o. M) Treating RN: Cornell Barman Primary Care Tarrin Menn: Berneta Sages Other Clinician: Referring Raider Valbuena: Berneta Sages Treating Kayde Warehime/Extender: Skipper Cliche in Treatment: 32 Active  Problems Location of Pain Severity and Description of Pain Patient Has Paino No Site Locations Rate the pain. Current Pain Level: 0 Pain Management and Medication Current Pain Management: Electronic Signature(s) Signed: 10/28/2019 4:48:14 PM By: Baruch Gouty RN, BSN Signed: 10/29/2019 5:48:54 PM By: Gretta Cool, BSN, RN, CWS, Kim RN, BSN Entered By: Baruch Gouty on 10/28/2019 08:15:36 [name] ([id number]) -------------------------------------------------------------------------------- Patient/Caregiver Education Details Patient Name: [name], [name]. Date of Service: 10/28/2019 8:00 AM Medical Record Number: [id number] Patient Account Number: [id number] Date of Birth/Gender: 1965/07/08 (54 y.o. M) Treating RN: Cornell Barman Primary Care Physician: Berneta Sages Other Clinician: Referring Physician: Berneta Sages Treating Physician/Extender: Skipper Cliche in Treatment: 85 Education Assessment Education Provided To: Patient Education Topics Provided Wound/Skin Impairment: Handouts: Caring for Your Ulcer, Other: wound care as presscribed Methods: Demonstration Responses: State content correctly Electronic Signature(s) Signed: 10/29/2019 5:48:54 PM By: Gretta Cool, BSN, RN, CWS, Kim RN, BSN Entered By: Gretta Cool, BSN, RN, CWS, Kim on 10/28/2019 81:15:72 [name] ([id number]) -------------------------------------------------------------------------------- Wound Assessment Details Patient Name: [name], [name]. Date of Service: 10/28/2019 8:00 AM Medical Record Number: [id number] Patient Account Number: [id number] Date of Birth/Sex: November 06, 1965 (54 y.o. M) Treating RN: Cornell Barman Primary Care Keimari Meridian: Berneta Sages Other Clinician: Referring Odie Edmonds: Berneta Sages Treating Kaelani Kendrick/Extender: Skipper Cliche in Treatment: 32 Wound Status Wound  Number: 4R Primary Etiology: Diabetic Wound/Ulcer of the Lower Extremity Wound Location: Right, Lateral Foot Wound  Status: Open Wounding Event: Gradually Appeared Comorbid History: Hypertension, Type II Diabetes, Neuropathy Date Acquired: 03/12/2019 Weeks Of Treatment: 32 Clustered Wound: No Pending Amputation On Presentation Wound Measurements Length: (cm) 0.4 Width: (cm) 0.8 Depth: (cm) 0.4 Area: (cm) 0.251 Volume: (cm) 0.101 % Reduction in Area: 60.5% % Reduction in Volume: 20.5% Epithelialization: Small (1-33%) Tunneling: No Undermining: Yes Starting Position (o'clock): 6 Ending Position (o'clock): 12 Maximum Distance: (cm) 0.7 Wound Description Classification: Grade 1 Wound Margin: Flat and Intact Exudate Amount: Small Exudate Type: Serosanguineous Exudate Color: red, brown Foul Odor After Cleansing: No Slough/Fibrino Yes Wound Bed Granulation Amount: Large (67-100%) Exposed Structure Granulation Quality: Red Fascia Exposed: No Necrotic Amount: None Present (0%) Fat Layer (Subcutaneous Tissue) Exposed: Yes Tendon Exposed: No Muscle Exposed: No Joint Exposed: No Bone Exposed: No Treatment Notes Wound #4R (Right, Lateral Foot) Notes gentamycin, silver alginate, dry gauze, offloading felt, conform Electronic Signature(s) Signed: 10/28/2019 4:48:14 PM By: Baruch Gouty RN, BSN Signed: 10/29/2019 5:48:54 PM By: Gretta Cool, BSN, RN, CWS, Kim RN, BSN Entered By: Baruch Gouty on 10/28/2019 08:23:38 [name] ([id number]) -------------------------------------------------------------------------------- Vitals Details Patient Name: [name] Date of Service: 10/28/2019 8:00 AM Medical Record Number: [id number] Patient Account Number: [id number] Date of Birth/Sex: 02-25-1965 (54 y.o. M) Treating RN: Cornell Barman Primary Care Aloha Bartok: Berneta Sages Other Clinician: Referring Nester Bachus: Berneta Sages Treating Talayah Picardi/Extender: Skipper Cliche in Treatment: 32 Vital Signs Time Taken: 08:08 Temperature (F): 98.6 Height (in): 74 Pulse (bpm): 76 Weight (lbs):  308 Respiratory Rate (breaths/min): 16 Body Mass Index (BMI): 39.5 Blood Pressure (mmHg): 153/81 Capillary Blood Glucose (mg/dl): 163 Reference Range: 80 - 120 mg / dl Notes glucose per pt report this am Electronic Signature(s) Signed: 10/28/2019 4:48:14 PM By: Baruch Gouty RN, BSN Entered By: Baruch Gouty on 10/28/2019 08:15:17

## 2019-11-04 ENCOUNTER — Other Ambulatory Visit: Payer: Self-pay

## 2019-11-04 ENCOUNTER — Encounter: Payer: Managed Care, Other (non HMO) | Attending: Physician Assistant | Admitting: Physician Assistant

## 2019-11-04 DIAGNOSIS — E11621 Type 2 diabetes mellitus with foot ulcer: Secondary | ICD-10-CM | POA: Diagnosis not present

## 2019-11-04 DIAGNOSIS — I10 Essential (primary) hypertension: Secondary | ICD-10-CM | POA: Insufficient documentation

## 2019-11-04 DIAGNOSIS — L84 Corns and callosities: Secondary | ICD-10-CM | POA: Diagnosis not present

## 2019-11-04 DIAGNOSIS — E785 Hyperlipidemia, unspecified: Secondary | ICD-10-CM | POA: Diagnosis not present

## 2019-11-04 DIAGNOSIS — E1140 Type 2 diabetes mellitus with diabetic neuropathy, unspecified: Secondary | ICD-10-CM | POA: Insufficient documentation

## 2019-11-04 DIAGNOSIS — L03115 Cellulitis of right lower limb: Secondary | ICD-10-CM | POA: Insufficient documentation

## 2019-11-04 DIAGNOSIS — L97512 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d: Secondary | ICD-10-CM | POA: Insufficient documentation

## 2019-11-04 DIAGNOSIS — Z89422 Acquired absence of other left toe(s): Secondary | ICD-10-CM | POA: Insufficient documentation

## 2019-11-04 NOTE — Progress Notes (Addendum)
ARSHAN, JABS ([id number]) Visit Report for 11/04/2019 Chief Complaint Document Details Patient Name: [name], [name]. Date of Service: 11/04/2019 12:45 PM Medical Record Number: [id number] Patient Account Number: [id number] Date of Birth/Sex: 01-20-1965 (54 y.o. M) Treating RN: Cornell Barman Primary Care Provider: Berneta Sages Other Clinician: Referring Provider: Berneta Sages Treating Provider/Extender: Skipper Cliche in Treatment: 78 Information Obtained from: Patient Chief Complaint Right lateral foot ulcer Electronic Signature(s) Signed: 11/04/2019 1:10:43 PM By: [name] PA-C Entered By: [name] on 11/04/2019 13:10:42 [name] ([id number]) -------------------------------------------------------------------------------- Debridement Details Patient Name: [name] Date of Service: 11/04/2019 12:45 PM Medical Record Number: [id number] Patient Account Number: [id number] Date of Birth/Sex: 1965/07/27 (54 y.o. M) Treating RN: Cornell Barman Primary Care Provider: Berneta Sages Other Clinician: Referring Provider: Berneta Sages Treating Provider/Extender: Skipper Cliche in Treatment: 33 Debridement Performed for Wound #4R Right,Lateral Foot Assessment: Performed By: Physician Tommie Sams., PA-C Debridement Type: Debridement Severity of Tissue Pre Debridement: Fat layer exposed Level of Consciousness (Pre- Awake and Alert procedure): Pre-procedure Verification/Time Out Yes - 13:04 Taken: Total Area Debrided (L x W): 0.4 (cm) x 0.6 (cm) = 0.24 (cm) Tissue and other material Viable, Non-Viable, Callus, Slough, Subcutaneous, Slough debrided: Level: Skin/Subcutaneous Tissue Debridement Description: Excisional Instrument: Curette Bleeding: Minimum Hemostasis Achieved: Pressure Response to Treatment: Procedure was tolerated well Level of Consciousness (Post- Awake and Alert procedure): Post Debridement Measurements of Total  Wound Length: (cm) 0.4 Width: (cm) 0.6 Depth: (cm) 0.4 Volume: (cm) 0.075 Character of Wound/Ulcer Post Debridement: Stable Severity of Tissue Post Debridement: Fat layer exposed Post Procedure Diagnosis Same as Pre-procedure Electronic Signature(s) Signed: 11/04/2019 4:38:36 PM By: [name] PA-C Signed: 11/05/2019 5:22:48 PM By: Gretta Cool, BSN, RN, CWS, Kim RN, BSN Entered By: Gretta Cool, BSN, RN, CWS, Kim on 11/04/2019 13:04:51 [name] ([id number]) -------------------------------------------------------------------------------- HPI Details Patient Name: [name] Date of Service: 11/04/2019 12:45 PM Medical Record Number: [id number] Patient Account Number: [id number] Date of Birth/Sex: May 15, 1965 (54 y.o. M) Treating RN: Cornell Barman Primary Care Provider: Berneta Sages Other Clinician: Referring Provider: Berneta Sages Treating Provider/Extender: Skipper Cliche in Treatment: 26 History of Present Illness HPI Description: This 54 year old male comes with an ulcerated area on the plantar aspect of the right foot which she's had for approximately a month. I have known him from a previous visit at [facility] wound center and was treated in the months of April and May 2016 and rapidly healed a left plantar ulcer with a total contact cast. He has been a diabetic for about 16 years and tries to keep active and is fairly compliant with his diabetes management. 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 Past medical history significant for hypertension, hyperlipidemia, and status post appendectomy 1993. He does not smoke or drink alcohol. 01/14/2015 -- the patient had tolerated his total contact cast very well and had no problems and has had no systemic symptoms. However when his total contact cast was cut open he had excessive amount of purulent drainage in spite of being on antibiotics. He had had a recent x-ray done in the ER 12/22/2014 which showed IMPRESSION:No  evidence of osseous erosion. Known soft tissue ulceration is not well characterized on radiograph. Scattered vascular calcifications seen. his last hemoglobin A1c in December was 7.3. He has been on Augmentin and doxycycline for the last 2 weeks. 01/21/2015 -- his culture grew rare growth of Pantoea species an MR moderate growth of Candida parapsilosis. it is sensitive to levofloxacin. He  has not heard back from the insurance company regarding his hyperbaric oxygen therapy. His MRI has not been done yet and we will try and get him an earlier date 01/28/2015 -- MRI was done last night -- IMPRESSION:1. Soft tissue ulcer overlying the plantar aspect of the fifth metatarsal head extending to the cortex. Subcortical marrow edema in the fifth metatarsal head with corresponding T1 hypointensity is concerning for early osteomyelitis of the plantar lateral aspect of the fifth metatarsal head. Chest x-ray done on 01/14/2015 shows bronchiectatic changes without infiltrate. EKG done on generally 17 2017 shows a normal sinus rhythm and is a normal EKG. 02/04/2015 -- he was asked to see Dr. [name] last week and had 2 appointments but had to cancel both due to pressures of work. Last night he has woken up with severe pain in the foot and leg and it is swollen up. No fever or no change in his blood glucose. Addendum: I spoke with Dr. [name] who kindly agreed to accept the patient for inpatient therapy and have also opened to the hospitalist Dr. [name], and discuss details of the management including PICC line and repeat cultures. 02/12/2015-- -- was seen by Dr. [name] in the hospital and a PICC line was placed. He was to receive Ceftazidime 2 g every 12 hourly, oral levofloxacin 750 mg every 24 hourly and oral fluconazole 200 mg daily. The antibiotics were to be given for 4 weeks except the Diflucan was to be given for the first 2 weeks. Reviewed note from 02/10/2015 -- and Dr. [name] had recommended  management for growth of MSSA and Serratia. He switched him from ceftazidime to ceftriaxone 2 g every 24 hours. Levofloxacin was stopped and he would continue on fluconazole for another week. He had asked me to decide whether further imaging was necessary and whether surgical debridement of the infected bone was needed. He is doing well and has been off work for this week and we will keep him off the next week. 02/22/2015 -- he was seen by my colleague on 01/19/2015 and at that time an incision and drainage was done on his right lateral forefoot on the dorsum. Today when I probed this wound it is frankly draining pus and it communicates with the ulcer on the plantar aspect of his right foot. The patient is still on IV ceftriaxone 2 g every 24 hours and is to be seen by Dr. [name] on Friday. 03/19/2015 -- On 03/04/2015 I spoke to Dr. 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 He recommended operative debridement which would probably result in the fifth metatarsal head and toe amputation.The patient would be referred back to [place] once he was done with surgery. He was admitted to [facility] yesterday and had surgery done by podiatry for a right fifth metatarsal acute osteomyelitis with cellulitis and abscess. He had a right foot incision and drainage with fifth metatarsal partial amputation and removal of toe infected bone and soft tissue with cultures. The wound was partially closed and packing of the distal end was done. Patient was already on cefepime 2 g IV every 8 hourly and put on vancomycin pending final cultures. He had grown moderate gram-negative rods, and later found to be rare diphtheroids. I received a call from Dr. Cannon Kettle the podiatrist and we discussed the above.  On 03/10/2015 he was found positive for influenza a and  has been put on Tamiflu. Since his discharge he has been seen by Dr. Cannon Kettle who is planning to remove his sutures this coming week. He was reviewed by Dr. [name] on 03/17/2015 and his Diflucan was stopped and vancomycin. After the 3 doses he is taking. He is going to change Ceftazidime to Zosyn 3.375 g IV every 8 hours. 03/25/2015 -- he was seen by the podiatrist a couple of days ago and the sutures were removed. She will follow back with him in 4 weeks' time and at that time x-rays will be taken and a custom molded insert would be made for his shoe. 04/01/2015 -- he was seen by Dr. [name] on 03/29/2015 who pulled the PICC line stopped his IV antibiotics and recommended starting doxycycline and levofloxacin for 2 weeks. He also stop the fluconazole. The patient will follow up with him only when necessary. Readmission: 03/15/17 on evaluation today patient presents for initial evaluation concerning the new issues although he has previously been evaluated in our clinic. Unfortunately the previous evaluation led to the patient having to proceed to amputation and so he is somewhat nervous about being here [name], [name] ([id number]) today. With that being said he has a very slight blister that occurred on the plantar aspect more medial on the right great toe that has been present for just a very short amount of time, several days. With that being said he felt like initially when he called that he somewhat overreacted but due to the fact that his previous issue led to amputation he is very cautious these days I explained that he did the right thing. With that being said he has been tolerating the dressing changes without complication mainly he just been covering this. He does not have any discomfort in secondary to neuropathy it's unlikely that he feels much. With that being said he does tell me that the issue that he had here is that he went barefoot when he knows he should not have which  subsequently led to the blisters. He states is definitely not doing that anymore. His most recent hemoglobin A1c was December and registered 6.9 his ABI today was 1.1. 03/27/17 on evaluation today patient appears to be doing great in regard to his right great toe ulcer. He has been tolerating the dressing changes without complication. The good news is this is making excellent progress and he seems to be caring for this in an excellent fashion. I see no evidence of breakdown that would make me concerned that he was at risk for infection/amputation. This has obviously been his concern due to the fifth toe amputation that was necessitated previous when he had a similar issue. Nonetheless this seems to be progressing much more nicely. Readmission: 03/13/2019 upon evaluation today patient presents for reevaluation here in the clinic concerning issues he has been having with his right foot on the lateral portion at the proximal end of the metatarsal. Subsequently he tells me that this just opened up in the past couple of days or so and the erythema really began about 24 to 48 hours ago. Fortunately there is no signs of systemic infection. 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 That left him with this bony prominence that is where the wound is currently. There is erythema surrounding that does have me concerned about cellulitis. With that being said he was noncompressible as far as  ABIs are concerned I do think we can need to check into arterial studies at this point. Patient does have a history of hypertension along with diabetes mellitus type 2. He also tends to develop quite a bit of callus white often. 03/20/2019 upon evaluation today patient appears to be doing very well in regard to his wounds currently. He has been tolerating the dressing changes without complication. Fortunately there is no signs of infection. His ABIs were good at this point and  registered at 1.07 on the left and 1.09 on the right. In regard to the x-ray this was negative as well for any signs of osteomyelitis. The patient also seems to be doing better in regard to the infection. He still has several days of the antibiotic left at this point best the Bactrim DS and he seems to be doing very well with this. Overall I am extremely pleased with the progress in 1 week's time he is going require some sharp debridement today however. 03/27/2019 upon evaluation today patient actually appears to be making some progress in regard to his wound. It is a little deeper but measuring smaller which is good news due to the fact that again were clear away some of the necrotic tissue which is why the depth is increasing a little bit. Nonetheless after like were getting very close to being down at a good wound bed. Fortunately there is no signs of active infection at this time. There is a little bit of erythema immediately surrounding the wound that we do want to be very cognizant of and careful about. For that reason I am going to go ahead and extend his antibiotic today for an additional 10 days that is the Bactrim. 04/03/2019 upon evaluation today patient appears to be making some progress. I do feel like the Annitta Needs is doing a good job along with the alginate is being packed into the wound space behind. He has been tolerating the dressing changes without complication. Fortunately there is no signs of active infection at this time. No fevers, chills, nausea, vomiting, or diarrhea. 04/10/2019 upon evaluation today patient appears to be making progress. The wound is not quite as deep but he still does not have a great wound surface yet for working on this and the Santyl does seem to be cleaning things up. Fortunately there is no evidence of active infection at this time. No fevers, chills, nausea, vomiting, or diarrhea. 04/17/2019 upon evaluation today patient appears to be doing excellent at this  time in regard to his foot ulcer. I do believe the Santyl with the alginate packed in behind has been of benefit for him and I am very pleased in this regard. With that being said I am feeling like the wound surface is improving quite a bit each time I see him still I do not believe we are at the point of stating that the wound bed is perfect but again were making progress. The patient is going require some sharp debridement today. 4/22; this is a patient who is using Santyl with calcium alginate. Apparently his wound dimensions have been improving. Patient is changing the dressing himself. He has a surgical shoe. He works in a sitting position therefore is not on his feet all that much. There is undermining 05/01/2019 upon evaluation today patient appears to be doing a little better in regard to the size of his wound though he still has some depth. This does seem to be much cleaner has been using Santyl on  the base of the wound followed by packing with silver alginate and behind. Nonetheless I do believe that based on what we are seeing we may need to switch to a collagen type dressing endoform may actually be excellent for him. 05/08/2019 upon evaluation today patient's wound actually appears to be showing better granulation tissue in the base of the wound. With that being said he does have some epiboly noted around the edges of the wound especially plantar and more distal. Subsequently this is can require sharp debridement today. Fortunately there is no signs of active systemic infection though I do feel like there is some local cellulitis noted at this point. The patient also notes has had increased drainage. 5/13; wound volume quite a bit improved this week down to 3 mm. Patient states pain and drainage better. Culture from last week grew staph aureus [not MRSA] and this is sensitive to quinolones and he is on Levaquin. HOWEVER he also grew abundant Enterococcus faecalis treatment of choice for this  is ampicillin. Quinolone coverage is unreliable 5/20 completing the Augmentin. Small wound on the right lateral foot. Thick rolled edges around the wound. Using silver alginate 05/29/2019 upon evaluation today patient appears to be doing better compared to last time I saw him. Fortunately there is no signs of active infection and I do feel like he is actually making good progress. Overall the quality of the wound bed along with the size looks improved and the inflammation/erythema that was previously noted by myself when I saw the couple weeks back actually has also resolved and this is great. Overall very pleased with how things stand. 06/06/2019 upon evaluation today patient actually appears to be doing quite well with regard to his foot ulcer with regard to some of the granulation were seen at this point. There does not appear to be any evidence of systemic or local infection although he still has discomfort I feel like things are moving in a slow but surely correct direction. I think we may need to help fill some of the space however even though he is using the collagen we may need to have something behind this such as a plain packing strip to try to help hold the collagen in the base of the wound. 06/12/2019 upon evaluation today patient appears to be doing well with regard to his foot ulcer. This seems to be making some slow but steady progress. The wound does not appear to be as deep today compared to where it was previous. Fortunately there is no signs of active infection at this time. No fevers, chills, nausea, vomiting, or diarrhea. 06/19/2019 on evaluation today patient's wound does not appear to be doing quite as well as what I would like to see. Fortunately there is no signs of systemic infection at this time which is great news. With that being said I do believe that the patient needs to have a wound culture as I do [name], [name]. ([id number]) believe he likely has a local infection  that needs to be addressed. He is also can require some debridement today as well. 06/26/2019 upon evaluation today patient's wound actually appears to be showing signs of excellent improvement today. There does not appear to be any evidence of active infection which is great news. There does not appear to be significant erythema at this point. The patient does have improvement overall in the appearance of the infection he has been taking the Augmentin the culture came back showing no organism was grown at  this point but nonetheless I do believe he has been doing well with the antibiotics I would continue that at this point. 07/03/2019 upon evaluation today patient appears to be doing about the same in regard to his wound. There does not appear to be any evidence of active infection and overall I am pleased with where things stand. The patient is tolerating the dressing changes without complication but it does not appear that they are packing the wound at this time which I think is going to slow down his healing prospects. 07/10/2019 upon evaluation today patient appears to be doing little better in my opinion in regard to the overall moisture collected in the base of the wound. They have been packing this with a little bit of silver alginate and that has done quite well. Overall I am pleased in that regard. Subsequently the redness does not appear to be any worse today I did actually go ahead and marked this since this can be 2 weeks between now and when I see him again I would make sure that if anything changes or worsens he will be able to notice this and go ahead and initiate treatment with the Augmentin. 07/22/2019 patient comes in today a little bit early for evaluation secondary to the fact that he was having issues with increased drainage and concern about infection. With that being said he does appear to have developed a blister area again proximal to where the original wound opening was in  although the wound bed itself actually appears to be doing somewhat better he has had increased drainage and tracking of fluid here due to this blister. Nonetheless I believe that that does need to be cleaned away today but again I am not really certain that I see a lot of evidence for infection at this point. 07/29/2019 on evaluation today patient actually appears to be doing better in regard to his foot ulcer. There is improvement compared to last week which is great news. Nonetheless he does come prepared to apply the total contact cast today which I think would be beneficial for him. He knows he is not supposed to drive he did bring his son with him today in order to drive him. 07/31/2019 upon evaluation today patient appears to be doing well with regard to his wound today. I do not see any signs of infection I do believe that he is definitely showing some signs of improvement here in the office in a couple days as far as the overall erythema and pressure getting to the wound area is concerned. With that being said I do believe that the cast has been of great benefit for him. 08/08/2019 upon evaluation today patient appears to be doing better in regard to his wound. Overall I feel like he is showing signs of improvement. The external measurement is really not bad at all although I did remove some of the callus in order to open up the wound itself the depth is actually coming quite well and I am extremely pleased in that regard I do not think we can to see much improvement externally until we get the internal depth filled in and that again is improved. Fortunately there is no signs of active infection at this time. No fevers, chills, nausea, vomiting, or diarrhea. 08/15/2019 on evaluation today patient actually appears to be doing well with regard to his foot ulcer. This is showing some signs of minimal improvement from the standpoint of the depth and appearance of the wound  as well as the edges of the  wound. It is taking its time and to be honest I do believe that he seems to be progressing well. I am happy with the cast and what it is achieving at this point. Have contemplated checking into Dermagraft for Apligraf for the patient but again he does seem to be making fairly good progress in my opinion. If it has not tremendously changed come next week I may consider one of the 2. 08/22/2019 on evaluation today patient appears to be making good progress with regard to his wound I am still very pleased with where things stand today. Overall I think that he is making good improvements week by week and I believe that we are getting continue with the Total contact cast I will likely switch to a silver alginate dressing. 08/29/2019 on evaluation today patient appears to be doing quite well with regard to his wound. Fortunately this is measuring quite small. There is no signs of active infection at this time although he did have a little bit of a callus area just to the side of his foot where we previously noted some fluid collecting underneath that was the case today as well. I did have to remove this. With that being said overall he seems to be doing quite well. 09/05/19 on evaluation today patient appears to be doing very well with regard to his original wound which is actually very small on evaluation today. With that being said he does have a blistered area that's more proximal to where this wound is that has been a little bit more concerned. Fortunately there is no signs of active infection at this time. I'm going to need to remove the blister tissue today. 10 09/12/2019 on evaluation today patient actually appears to be doing much better in regard to his wound. I think he is actually improved more without the cast that he was prior with the cast. Overall very pleased with where things stand today. 09/22/2019 upon evaluation today patient appears to be doing very well at this time in regard to his foot  ulcer. In fact I feel like he is continue to make good progress. I see no signs right now that there is any active infection which is great news. Also see very little evidence of pressure he does have some callus buildup but again this seems to be minimal. 09/29/2019 upon evaluation today patient appears to be doing well with regard to his foot ulcer. He has been tolerating the dressing changes without complication. Fortunately there is no signs of active infection at this time. No fevers, chills, nausea, vomiting, or diarrhea. 10/28/2019 upon evaluation today patient appears to be doing a little bit more poorly than last time I saw him as last time we assumed he was completely healed. Apparently that was not the case or else this is reopened but I tend to believe that this probably has never completely closed internally though it appeared to be. He has been using the offloading felt which seems to have done well there is no signs of redness no evidence of erythema all of which is good news. Nonetheless I am concerned about the fact that he is having issues here with what appears to be a reopening of the wound. 11/04/2019 upon evaluation today patient's wound actually showed signs of fairly good improvement today which is great news the wound bed does not appear to be significantly callus covered and also does not appear to show any signs  of significant infection or really any infection whatsoever. I am very pleased in this regard. In general I think that he is making good progress although this is a lot slower than he would like to see I think we are headed back in the correct direction. He is continuing to use the offloading felt. Electronic Signature(s) ROBLEY, MATASSA ([id number]) Signed: 11/04/2019 3:56:19 PM By: [name] PA-C Entered By: [name] on 11/04/2019 15:56:19 [name], [name]  ([id number]) -------------------------------------------------------------------------------- Physical Exam Details Patient Name: DAOUDA, LONZO Date of Service: 11/04/2019 12:45 PM Medical Record Number: [id number] Patient Account Number: [id number] Date of Birth/Sex: 22-May-1965 (54 y.o. M) Treating RN: Cornell Barman Primary Care Provider: Berneta Sages Other Clinician: Referring Provider: Berneta Sages Treating Provider/Extender: Skipper Cliche in Treatment: 51 Constitutional Well-nourished and well-hydrated in no acute distress. Respiratory normal breathing without difficulty. Psychiatric this patient is able to make decisions and demonstrates good insight into disease process. Alert and Oriented x 3. pleasant and cooperative. Notes Upon inspection patient's wound bed actually did have some minimal callus noted around the edges of the wound and some slough noted in the central portion of the wound. I did perform sharp debridement to clear both of these away today patient tolerated this without complication post debridement the wound bed appears to be doing much better which is great news. Overall I think he is headed in the right direction Electronic Signature(s) Signed: 11/04/2019 3:56:37 PM By: [name] PA-C Entered By: [name] on 11/04/2019 15:56:36 [name] ([id number]) -------------------------------------------------------------------------------- Physician Orders Details Patient Name: [name] Date of Service: 11/04/2019 12:45 PM Medical Record Number: [id number] Patient Account Number: [id number] Date of Birth/Sex: 1965-04-06 (54 y.o. 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 - in office o Dial antibacterial soap, wash wounds, rinse and pat dry prior to dressing wounds Anesthetic (add to Medication List) Wound #4R Right,Lateral Foot o Topical Lidocaine 4% cream applied to wound bed prior to debridement (In Clinic Only). Primary Wound Dressing Wound #4R Right,Lateral Foot o Gentamicin Sulfate Cream o Silver Alginate - Thin layer of gentamicin under the alginate Secondary Dressing Wound #4R Right,Lateral Foot o Conform/Kerlix - Offloading felt donut, dry gauze, roll gauze to cover Dressing Change Frequency Wound #4R Right,Lateral Foot o Change Dressing Monday, Wednesday, Friday Follow-up Appointments Wound #4R Right,Lateral Foot o Return Appointment in 1 week. Electronic Signature(s) Signed: 11/04/2019 4:38:36 PM By: [name] PA-C Signed: 11/05/2019 5:22:48 PM By: Gretta Cool, BSN, RN, CWS, Kim RN, BSN Entered By: Gretta Cool, BSN, RN, CWS, Kim on 11/04/2019 13:13:39 [name], [name] ([id number]) -------------------------------------------------------------------------------- Problem List Details Patient Name: [name], [name] Date of Service: 11/04/2019 12:45 PM Medical Record Number: [id number] Patient Account Number: [id number] Date of Birth/Sex: Jan 06, 1965 (54 y.o. 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 M) Treating RN: Cornell Barman Primary Care Provider: Berneta Sages Other Clinician: Referring Provider: Berneta Sages Treating Provider/Extender: Skipper Cliche in Treatment: 104 Subjective Chief Complaint Information obtained from Patient Right lateral foot ulcer History of Present Illness (HPI) This 54 year old male comes with an ulcerated area on the plantar aspect of the right foot which she's had for approximately a month. I have known him from a previous visit at [facility] wound center and was treated in the months of April and May 2016 and rapidly healed a left plantar ulcer with a total contact cast. He has been a diabetic for about 16 years and tries to keep active and is fairly compliant with his diabetes management. 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 Past medical history significant for hypertension, hyperlipidemia, and status post appendectomy 1993. He does not smoke or drink alcohol. 01/14/2015 -- the patient had tolerated his total contact cast very well and had no problems and has had no systemic symptoms. However when his total contact cast was cut open he had excessive amount of purulent drainage in spite of being on  antibiotics. He had had a recent x-ray done in the ER 12/22/2014 which showed IMPRESSION:No evidence of osseous erosion. Known soft tissue ulceration is not well characterized on radiograph. Scattered vascular calcifications seen. his last hemoglobin A1c in December was 7.3. He has been on Augmentin and doxycycline for the last 2 weeks. 01/21/2015 -- his culture grew rare growth of Pantoea species an MR moderate growth of Candida parapsilosis. it is sensitive to levofloxacin. He has not heard back from the insurance company regarding his hyperbaric oxygen therapy. His MRI has not been done yet and we will try and get him an earlier date 01/28/2015 -- MRI was done last night -- IMPRESSION:1. Soft tissue ulcer overlying the plantar aspect of the fifth metatarsal head extending to the cortex. Subcortical marrow edema in the fifth metatarsal head with corresponding T1 hypointensity is concerning for early osteomyelitis of the plantar lateral aspect of the fifth metatarsal head. Chest x-ray done on 01/14/2015 shows bronchiectatic changes without infiltrate. EKG done on generally 17 2017 shows a normal sinus rhythm and is a normal EKG. 02/04/2015 -- he was asked to see Dr. [name] last week and had 2 appointments but had to cancel both due to pressures of work. Last night he has woken up with severe pain in the foot and leg and it is swollen up. No fever or no change in his blood glucose. Addendum: I spoke with Dr. [name] who kindly agreed to accept the patient for inpatient therapy and have also opened to the hospitalist Dr. [name], and discuss details of the management including PICC line and repeat cultures. 02/12/2015-- -- was seen by Dr. [name] in the hospital and a PICC line was placed. He was to receive Ceftazidime 2 g every 12  hourly, oral levofloxacin 750 mg every 24 hourly and oral fluconazole 200 mg daily. The antibiotics were to be given for 4 weeks except the Diflucan was to  be given for the first 2 weeks. Reviewed note from 02/10/2015 -- and Dr. [name] had recommended management for growth of MSSA and Serratia. He switched him from ceftazidime to ceftriaxone 2 g every 24 hours. Levofloxacin was stopped and he would continue on fluconazole for another week. He had asked me to decide whether further imaging was necessary and whether surgical debridement of the infected bone was needed. He is doing well and has been off work for this week and we will keep him off the next week. 02/22/2015 -- he was seen by my colleague on 01/19/2015 and at that time an incision and drainage was done on his right lateral forefoot on the dorsum. Today when I probed this wound it is frankly draining pus and it communicates with the ulcer on the plantar aspect of his right foot. The patient is still on IV ceftriaxone 2 g every 24 hours and is to be seen by Dr. [name] on Friday. 03/19/2015 -- On 03/04/2015 I spoke to Dr. 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 He recommended operative debridement which would probably result in the fifth metatarsal head and toe amputation.The patient would be referred back to [place] once he was done with surgery. He was admitted to [facility] yesterday and had surgery done by podiatry for a right fifth metatarsal acute osteomyelitis with cellulitis and abscess. He had a right foot incision and drainage with fifth metatarsal partial amputation and removal of toe infected bone and soft tissue with cultures. The wound was partially closed and packing of the distal end was done. Patient was already on cefepime 2 g IV every 8 hourly and put on vancomycin pending final cultures. He had grown moderate gram-negative rods, and later found to be rare diphtheroids. I received a call from Dr. Cannon Kettle  the podiatrist and we discussed the above. On 03/10/2015 he was found positive for influenza a and has been put on Tamiflu. Since his discharge he has been seen by Dr. Cannon Kettle who is planning to remove his sutures this coming week. He was reviewed by Dr. [name] on 03/17/2015 and his Diflucan was stopped and vancomycin. After the 3 doses he is taking. He is going to change Ceftazidime to Zosyn 3.375 g IV every 8 hours. 03/25/2015 -- he was seen by the podiatrist a couple of days ago and the sutures were removed. She will follow back with him in 4 weeks' time and at that time x-rays will be taken and a custom molded insert would be made for his shoe. 04/01/2015 -- he was seen by Dr. [name] on 03/29/2015 who pulled the PICC line stopped his IV antibiotics and recommended starting doxycycline and levofloxacin for 2 weeks. He also stop the fluconazole. The patient will follow up with him only when necessary. [name], [name] ([id number]) Readmission: 03/15/17 on evaluation today patient presents for initial evaluation concerning the new issues although he has previously been evaluated in our clinic. Unfortunately the previous evaluation led to the patient having to proceed to amputation and so he is somewhat nervous about being here today. With that being said he has a very slight blister that  occurred on the plantar aspect more medial on the right great toe that has been present for just a very short amount of time, several days. With that being said he felt like initially when he called that he somewhat overreacted but due to the fact that his previous issue led to amputation he is very cautious these days I explained that he did the right thing. With that being said he has been tolerating the dressing changes without complication mainly he just been covering this. He does not have any discomfort in secondary to neuropathy it's unlikely that he feels much. With that being said he does tell me  that the issue that he had here is that he went barefoot when he knows he should not have which subsequently led to the blisters. He states is definitely not doing that anymore. His most recent hemoglobin A1c was December and registered 6.9 his ABI today was 1.1. 03/27/17 on evaluation today patient appears to be doing great in regard to his right great toe ulcer. He has been tolerating the dressing changes without complication. The good news is this is making excellent progress and he seems to be caring for this in an excellent fashion. I see no evidence of breakdown that would make me concerned that he was at risk for infection/amputation. This has obviously been his concern due to the fifth toe amputation that was necessitated previous when he had a similar issue. Nonetheless this seems to be progressing much more nicely. Readmission: 03/13/2019 upon evaluation today patient presents for reevaluation here in the clinic concerning issues he has been having with his right foot on the lateral portion at the proximal end of the metatarsal. Subsequently he tells me that this just opened up in the past couple of days or so and the erythema really began about 24 to 48 hours ago. Fortunately there is no signs of systemic infection. 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 That left him with this bony prominence that is where the wound is currently. There is erythema surrounding that does have me concerned about cellulitis. With that being said he was noncompressible as far as ABIs are concerned I do think we can need to check into arterial studies at this point. Patient does have a history of hypertension along with diabetes mellitus type 2. He also tends to develop quite a bit of callus white often. 03/20/2019 upon evaluation today patient appears to be doing very well in regard to his wounds currently. He has been tolerating the dressing changes without  complication. Fortunately there is no signs of infection. His ABIs were good at this point and registered at 1.07 on the left and 1.09 on the right. In regard to the x-ray this was negative as well for any signs of osteomyelitis. The patient also seems to be doing better in regard to the infection. He still has several days of the antibiotic left at this point best the Bactrim DS and he seems to be doing very well with this. Overall I am extremely pleased with the progress in 1 week's time he is going require some sharp debridement today however. 03/27/2019 upon evaluation today patient actually appears to be making some progress in regard to his wound. It is a little deeper but measuring smaller which is good news due to the fact that again were clear away some of the necrotic tissue which is why the depth is increasing a little bit.  Nonetheless after like were getting very close to being down at a good wound bed. Fortunately there is no signs of active infection at this time. There is a little bit of erythema immediately surrounding the wound that we do want to be very cognizant of and careful about. For that reason I am going to go ahead and extend his antibiotic today for an additional 10 days that is the Bactrim. 04/03/2019 upon evaluation today patient appears to be making some progress. I do feel like the Annitta Needs is doing a good job along with the alginate is being packed into the wound space behind. He has been tolerating the dressing changes without complication. Fortunately there is no signs of active infection at this time. No fevers, chills, nausea, vomiting, or diarrhea. 04/10/2019 upon evaluation today patient appears to be making progress. The wound is not quite as deep but he still does not have a great wound surface yet for working on this and the Santyl does seem to be cleaning things up. Fortunately there is no evidence of active infection at this time. No fevers, chills, nausea, vomiting,  or diarrhea. 04/17/2019 upon evaluation today patient appears to be doing excellent at this time in regard to his foot ulcer. I do believe the Santyl with the alginate packed in behind has been of benefit for him and I am very pleased in this regard. With that being said I am feeling like the wound surface is improving quite a bit each time I see him still I do not believe we are at the point of stating that the wound bed is perfect but again were making progress. The patient is going require some sharp debridement today. 4/22; this is a patient who is using Santyl with calcium alginate. Apparently his wound dimensions have been improving. Patient is changing the dressing himself. He has a surgical shoe. He works in a sitting position therefore is not on his feet all that much. There is undermining 05/01/2019 upon evaluation today patient appears to be doing a little better in regard to the size of his wound though he still has some depth. This does seem to be much cleaner has been using Santyl on the base of the wound followed by packing with silver alginate and behind. Nonetheless I do believe that based on what we are seeing we may need to switch to a collagen type dressing endoform may actually be excellent for him. 05/08/2019 upon evaluation today patient's wound actually appears to be showing better granulation tissue in the base of the wound. With that being said he does have some epiboly noted around the edges of the wound especially plantar and more distal. Subsequently this is can require sharp debridement today. Fortunately there is no signs of active systemic infection though I do feel like there is some local cellulitis noted at this point. The patient also notes has had increased drainage. 5/13; wound volume quite a bit improved this week down to 3 mm. Patient states pain and drainage better. Culture from last week grew staph aureus [not MRSA] and this is sensitive to quinolones and he is on  Levaquin. HOWEVER he also grew abundant Enterococcus faecalis treatment of choice for this is ampicillin. Quinolone coverage is unreliable 5/20 completing the Augmentin. Small wound on the right lateral foot. Thick rolled edges around the wound. Using silver alginate 05/29/2019 upon evaluation today patient appears to be doing better compared to last time I saw him. Fortunately there is no signs of active  infection and I do feel like he is actually making good progress. Overall the quality of the wound bed along with the size looks improved and the inflammation/erythema that was previously noted by myself when I saw the couple weeks back actually has also resolved and this is great. Overall very pleased with how things stand. 06/06/2019 upon evaluation today patient actually appears to be doing quite well with regard to his foot ulcer with regard to some of the granulation were seen at this point. There does not appear to be any evidence of systemic or local infection although he still has discomfort I feel like things are moving in a slow but surely correct direction. I think we may need to help fill some of the space however even though he is using the collagen we may need to have something behind this such as a plain packing strip to try to help hold the collagen in the base of the wound. 06/12/2019 upon evaluation today patient appears to be doing well with regard to his foot ulcer. This seems to be making some slow but steady progress. The wound does not appear to be as deep today compared to where it was previous. Fortunately there is no signs of active infection at [name], [name]. ([id number]) this time. No fevers, chills, nausea, vomiting, or diarrhea. 06/19/2019 on evaluation today patient's wound does not appear to be doing quite as well as what I would like to see. Fortunately there is no signs of systemic infection at this time which is great news. With that being said I do believe that  the patient needs to have a wound culture as I do believe he likely has a local infection that needs to be addressed. He is also can require some debridement today as well. 06/26/2019 upon evaluation today patient's wound actually appears to be showing signs of excellent improvement today. There does not appear to be any evidence of active infection which is great news. There does not appear to be significant erythema at this point. The patient does have improvement overall in the appearance of the infection he has been taking the Augmentin the culture came back showing no organism was grown at this point but nonetheless I do believe he has been doing well with the antibiotics I would continue that at this point. 07/03/2019 upon evaluation today patient appears to be doing about the same in regard to his wound. There does not appear to be any evidence of active infection and overall I am pleased with where things stand. The patient is tolerating the dressing changes without complication but it does not appear that they are packing the wound at this time which I think is going to slow down his healing prospects. 07/10/2019 upon evaluation today patient appears to be doing little better in my opinion in regard to the overall moisture collected in the base of the wound. They have been packing this with a little bit of silver alginate and that has done quite well. Overall I am pleased in that regard. Subsequently the redness does not appear to be any worse today I did actually go ahead and marked this since this can be 2 weeks between now and when I see him again I would make sure that if anything changes or worsens he will be able to notice this and go ahead and initiate treatment with the Augmentin. 07/22/2019 patient comes in today a little bit early for evaluation secondary to the fact that he was having  issues with increased drainage and concern about infection. With that being said he does appear to have  developed a blister area again proximal to where the original wound opening was in although the wound bed itself actually appears to be doing somewhat better he has had increased drainage and tracking of fluid here due to this blister. Nonetheless I believe that that does need to be cleaned away today but again I am not really certain that I see a lot of evidence for infection at this point. 07/29/2019 on evaluation today patient actually appears to be doing better in regard to his foot ulcer. There is improvement compared to last week which is great news. Nonetheless he does come prepared to apply the total contact cast today which I think would be beneficial for him. He knows he is not supposed to drive he did bring his son with him today in order to drive him. 07/31/2019 upon evaluation today patient appears to be doing well with regard to his wound today. I do not see any signs of infection I do believe that he is definitely showing some signs of improvement here in the office in a couple days as far as the overall erythema and pressure getting to the wound area is concerned. With that being said I do believe that the cast has been of great benefit for him. 08/08/2019 upon evaluation today patient appears to be doing better in regard to his wound. Overall I feel like he is showing signs of improvement. The external measurement is really not bad at all although I did remove some of the callus in order to open up the wound itself the depth is actually coming quite well and I am extremely pleased in that regard I do not think we can to see much improvement externally until we get the internal depth filled in and that again is improved. Fortunately there is no signs of active infection at this time. No fevers, chills, nausea, vomiting, or diarrhea. 08/15/2019 on evaluation today patient actually appears to be doing well with regard to his foot ulcer. This is showing some signs of minimal improvement from  the standpoint of the depth and appearance of the wound as well as the edges of the wound. It is taking its time and to be honest I do believe that he seems to be progressing well. I am happy with the cast and what it is achieving at this point. Have contemplated checking into Dermagraft for Apligraf for the patient but again he does seem to be making fairly good progress in my opinion. If it has not tremendously changed come next week I may consider one of the 2. 08/22/2019 on evaluation today patient appears to be making good progress with regard to his wound I am still very pleased with where things stand today. Overall I think that he is making good improvements week by week and I believe that we are getting continue with the Total contact cast I will likely switch to a silver alginate dressing. 08/29/2019 on evaluation today patient appears to be doing quite well with regard to his wound. Fortunately this is measuring quite small. There is no signs of active infection at this time although he did have a little bit of a callus area just to the side of his foot where we previously noted some fluid collecting underneath that was the case today as well. I did have to remove this. With that being said overall he seems to be doing  quite well. 09/05/19 on evaluation today patient appears to be doing very well with regard to his original wound which is actually very small on evaluation today. With that being said he does have a blistered area that's more proximal to where this wound is that has been a little bit more concerned. Fortunately there is no signs of active infection at this time. I'm going to need to remove the blister tissue today. 10 09/12/2019 on evaluation today patient actually appears to be doing much better in regard to his wound. I think he is actually improved more without the cast that he was prior with the cast. Overall very pleased with where things stand today. 09/22/2019 upon  evaluation today patient appears to be doing very well at this time in regard to his foot ulcer. In fact I feel like he is continue to make good progress. I see no signs right now that there is any active infection which is great news. Also see very little evidence of pressure he does have some callus buildup but again this seems to be minimal. 09/29/2019 upon evaluation today patient appears to be doing well with regard to his foot ulcer. He has been tolerating the dressing changes without complication. Fortunately there is no signs of active infection at this time. No fevers, chills, nausea, vomiting, or diarrhea. 10/28/2019 upon evaluation today patient appears to be doing a little bit more poorly than last time I saw him as last time we assumed he was completely healed. Apparently that was not the case or else this is reopened but I tend to believe that this probably has never completely closed internally though it appeared to be. He has been using the offloading felt which seems to have done well there is no signs of redness no evidence of erythema all of which is good news. Nonetheless I am concerned about the fact that he is having issues here with what appears to be a reopening of the wound. 11/04/2019 upon evaluation today patient's wound actually showed signs of fairly good improvement today which is great news the wound bed does not appear to be significantly callus covered and also does not appear to show any signs of significant infection or really any infection whatsoever. I am very pleased in this regard. In general I think that he is making good progress although this is a lot slower than he would like to see I think we are headed back in the correct direction. He is continuing to use the offloading felt. [name], [name] ([id number]) Objective Constitutional Well-nourished and well-hydrated in no acute distress. Vitals Time Taken: 12:54 PM, Height: 74 in, Weight: 308 lbs, BMI:  39.5, Temperature: 98.1 F, Pulse: 78 bpm, Respiratory Rate: 18 breaths/min, Blood Pressure: 170/78 mmHg. Respiratory normal breathing without difficulty. Psychiatric this patient is able to make decisions and demonstrates good insight into disease process. Alert and Oriented x 3. pleasant and cooperative. General Notes: Upon inspection patient's wound bed actually did have some minimal callus noted around the edges of the wound and some slough noted in the central portion of the wound. I did perform sharp debridement to clear both of these away today patient tolerated this without complication post debridement the wound bed appears to be doing much better which is great news. Overall I think he is headed in the right direction Integumentary (Hair, Skin) Wound #4R status is Open. Original cause of wound was Gradually Appeared. The wound is located on the Right,Lateral Foot. The wound measures  0.4cm length x 0.6cm width x 0.3cm depth; 0.188cm^2 area and 0.057cm^3 volume. There is Fat Layer (Subcutaneous Tissue) exposed. There is no tunneling or undermining noted. There is a small amount of serosanguineous drainage noted. The wound margin is flat and intact. There is large (67-100%) red granulation within the wound bed. There is no necrotic tissue within the wound bed. Assessment Active Problems ICD-10 Type 2 diabetes mellitus with foot ulcer 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d Cellulitis of right lower limb Corns and callosities Essential (primary) hypertension Acquired absence of other left toe(s) Procedures Wound #4R Pre-procedure diagnosis of Wound #4R is a Diabetic Wound/Ulcer of the Lower Extremity located on the Right,Lateral Foot .Severity of Tissue Pre Debridement is: Fat layer exposed. There was a Excisional Skin/Subcutaneous Tissue Debridement with a total area of 0.24 sq cm performed by Tommie Sams., PA-C. With the following  instrument(s): Curette to remove Viable and Non-Viable tissue/material. Material removed includes Callus, Subcutaneous Tissue, and Slough. No specimens were taken. A time out was conducted at 13:04, prior to the start of the procedure. A Minimum amount of bleeding was controlled with Pressure. The procedure was tolerated well. Post Debridement Measurements: 0.4cm length x 0.6cm width x 0.4cm depth; 0.075cm^3 volume. Character of Wound/Ulcer Post Debridement is stable. Severity of Tissue Post Debridement is: Fat layer exposed. Post procedure Diagnosis Wound #4R: Same as Pre-Procedure Plan [name], [name] ([id number]) Wound Cleansing: Wound #4R Right,Lateral Foot: Clean wound with Normal Saline. - in office Dial antibacterial soap, wash wounds, rinse and pat dry prior to dressing wounds Anesthetic (add to Medication List): Wound #4R Right,Lateral Foot: Topical Lidocaine 4% cream applied to wound bed prior to debridement (In Clinic Only). Primary Wound Dressing: Wound #4R Right,Lateral Foot: Gentamicin Sulfate Cream Silver Alginate - Thin layer of gentamicin under the alginate Secondary Dressing: Wound #4R Right,Lateral Foot: Conform/Kerlix - Offloading felt donut, dry gauze, roll gauze to cover Dressing Change Frequency: Wound #4R Right,Lateral Foot: Change Dressing Monday, Wednesday, Friday Follow-up Appointments: Wound #4R Right,Lateral Foot: Return Appointment in 1 week. 1. Would recommend currently that we going to continue with the wound care measures as before specifically with regard to the silver alginate dressing with gentamicin. 2. I am also can recommend that we continue with the offloading felt I think that still of utmost importance. 3. I would also still recommend the patient continue to try to limit his walking is much as possible. Obviously the more of this he is able to limit the better off he will be as far as allowing this area to heal. We will see patient back  for reevaluation in 2 weeks here in the clinic. If anything worsens or changes patient will contact our office for additional recommendations. Electronic Signature(s) Signed: 11/04/2019 3:57:13 PM By: [name] PA-C Entered By: [name] on 11/04/2019 15:57:12 [name] ([id number]) -------------------------------------------------------------------------------- SuperBill Details Patient Name: [name] Date of Service: 11/04/2019 Medical Record Number: [id number] Patient Account Number: [id number] Date of Birth/Sex: 1965-05-20 (54 y.o. M) Treating RN: Cornell Barman Primary Care Provider: Berneta Sages Other Clinician: Referring Provider: Berneta Sages Treating Provider/Extender: Skipper Cliche in Treatment: 33 Diagnosis Coding ICD-10 Codes Code Description E11.621 Type 2 diabetes mellitus with foot ulcer L97.512 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d L03.115 Cellulitis of right lower limb L84 Corns and callosities I10 Essential (primary) hypertension Z89.422 Acquired absence of other left toe(s) Facility Procedures CPT4  Code: 30148403 Description: 97953 - DEB SUBQ TISSUE 20 SQ CM/< Modifier: Quantity: 1 CPT4 Code: Description: ICD-10 Diagnosis Description K92.230 Non-pressure chronic ulcer of other part of right foot with fat layer ex Modifier: posed Quantity: Physician Procedures CPT4 Code: 0979499 Description: 71820 - WC PHYS SUBQ TISS 20 SQ CM Modifier: Quantity: 1 CPT4 Code: Description: ICD-10 Diagnosis Description V90.689 Non-pressure chronic ulcer of other part of right foot with fat layer ex Modifier: posed Quantity: Electronic Signature(s) Signed: 11/04/2019 3:57:28 PM By: [name] PA-C Entered By: [name] on 11/04/2019 15:57:26

## 2019-11-05 NOTE — Progress Notes (Signed)
MAXMILIAN, TROSTEL ([id number]) Visit Report for 11/04/2019 Arrival Information Details Patient Name: [name], [name]. Date of Service: 11/04/2019 12:45 PM Medical Record Number: [id number] Patient Account Number: [id number] Date of Birth/Sex: [birth year] (54 y.o. M) Treating RN: Cornell Barman Primary Care Damean Poffenberger: Berneta Sages Other Clinician: Referring Renny Gunnarson: Berneta Sages Treating Michille Mcelrath/Extender: Skipper Cliche in Treatment: 51 Visit Information History Since Last Visit Added or deleted any medications: No Patient Arrived: Ambulatory Has Dressing in Place as Prescribed: Yes Arrival Time: 12:54 Has Compression in Place as Prescribed: Yes Accompanied By: self Pain Present Now: No Transfer Assistance: None Patient Identification Verified: Yes Secondary Verification Process Completed: Yes Patient Requires Transmission-Based Precautions: No Patient Has Alerts: Yes Patient Alerts: DMII Electronic Signature(s) Signed: 11/05/2019 5:22:48 PM By: Gretta Cool, BSN, RN, CWS, Kim RN, BSN Entered By: Gretta Cool, BSN, RN, CWS, Kim on 11/04/2019 12:54:22 [name] ([id number]) -------------------------------------------------------------------------------- Encounter Discharge Information Details Patient Name: [name], [name]. Date of Service: 11/04/2019 12:45 PM Medical Record Number: [id number] Patient Account Number: [id number] Date of Birth/Sex: Dec 20, 1965 (54 y.o. M) Treating RN: Cornell Barman Primary Care Jeven Topper: Berneta Sages Other Clinician: Referring Margareta Laureano: Berneta Sages Treating Daniela Hernan/Extender: Skipper Cliche in Treatment: 86 Encounter Discharge Information Items Post Procedure Vitals Discharge Condition: Stable Unable to obtain vitals Reason: Patient request Ambulatory Status: Ambulatory Discharge Destination: Home Transportation: Private Auto Accompanied By: self Schedule Follow-up Appointment: Yes Clinical Summary of Care: Electronic Signature(s) Signed:  11/05/2019 5:22:48 PM By: Gretta Cool, BSN, RN, CWS, Kim RN, BSN Entered By: Gretta Cool, BSN, RN, CWS, Kim on 11/04/2019 13:15:06 [name] ([id number]) -------------------------------------------------------------------------------- Lower Extremity Assessment Details Patient Name: [name], [name]. Date of Service: 11/04/2019 12:45 PM Medical Record Number: [id number] Patient Account Number: [id number] Date of Birth/Sex: 1965-06-09 (54 y.o. M) Treating RN: Cornell Barman Primary Care Kordelia Severin: Berneta Sages Other Clinician: Referring Omair Dettmer: Berneta Sages Treating Cici Rodriges/Extender: Skipper Cliche in Treatment: 33 Vascular Assessment Pulses: Dorsalis Pedis Palpable: [Right:Yes] Electronic Signature(s) Signed: 11/05/2019 5:22:48 PM By: Gretta Cool, BSN, RN, CWS, Kim RN, BSN Entered By: Gretta Cool, BSN, RN, CWS, Kim on 11/04/2019 12:59:05 [name] ([id number]) -------------------------------------------------------------------------------- Multi Wound Chart Details Patient Name: [name], [name]. Date of Service: 11/04/2019 12:45 PM Medical Record Number: [id number] Patient Account Number: [id number] Date of Birth/Sex: 1965/12/19 (54 y.o. M) Treating RN: Cornell Barman Primary Care Clebert Wenger: Berneta Sages Other Clinician: Referring [name] [name]: Berneta Sages Treating Jera Headings/Extender: Skipper Cliche in Treatment: 33 Vital Signs Height(in): 74 Pulse(bpm): 68 Weight(lbs): 308 Blood Pressure(mmHg): 170/78 Body Mass Index(BMI): 40 Temperature(F): 98.1 Respiratory Rate(breaths/min): 18 Photos: [N/A:N/A] Wound Location: Right, Lateral Foot N/A N/A Wounding Event: Gradually Appeared N/A N/A Primary Etiology: Diabetic Wound/Ulcer of the Lower N/A N/A Extremity Comorbid History: Hypertension, Type II Diabetes, N/A N/A Neuropathy Date Acquired: 03/12/2019 N/A N/A Weeks of Treatment: 33 N/A N/A Wound Status: Open N/A N/A Wound Recurrence: Yes N/A N/A Pending Amputation on Yes N/A  N/A Presentation: Measurements L x W x D (cm) 0.4x0.6x0.3 N/A N/A Area (cm) : 0.188 N/A N/A Volume (cm) : 0.057 N/A N/A % Reduction in Area: 70.40% N/A N/A % Reduction in Volume: 55.10% N/A N/A Classification: Grade 1 N/A N/A Exudate Amount: Small N/A N/A Exudate Type: Serosanguineous N/A N/A Exudate Color: red, brown N/A N/A Wound Margin: Flat and Intact N/A N/A Granulation Amount: Large (67-100%) N/A N/A Granulation Quality: Red N/A N/A Necrotic Amount: None Present (0%) N/A N/A Exposed Structures: Fat Layer (Subcutaneous Tissue): N/A N/A Yes Fascia: No Tendon: No Muscle: No Joint: No Bone: No Epithelialization: Small (  1-33%) N/A N/A Treatment Notes Electronic Signature(s) Signed: 11/05/2019 5:22:48 PM By: Gretta Cool, BSN, RN, CWS, Kim RN, BSN Entered By: Gretta Cool, BSN, RN, CWS, Kim on 11/04/2019 13:03:49 [name], [name] ([id number]) [name], [name] ([id number]) -------------------------------------------------------------------------------- [place] Details Patient Name: [name], [name]. Date of Service: 11/04/2019 12:45 PM Medical Record Number: [id number] Patient Account Number: [id number] Date of Birth/Sex: December 21, 1965 (54 y.o. M) Treating RN: Cornell Barman Primary Care Elek Holderness: Berneta Sages Other Clinician: Referring Yvan Dority: Berneta Sages Treating Shalen Petrak/Extender: Skipper Cliche in Treatment: 22 Active Inactive Nutrition Nursing Diagnoses: Impaired glucose control: actual or potential Goals: Patient/caregiver verbalizes understanding of need to maintain therapeutic glucose control per primary care physician Date Initiated: 03/13/2019 Target Resolution Date: 04/11/2019 Goal Status: Active Interventions: Assess patient nutrition upon admission and as needed per policy Notes: Orientation to the Wound Care Program Nursing Diagnoses: Knowledge deficit related to the wound healing center program Goals: Patient/caregiver will verbalize  understanding of the [place] Date Initiated: 03/13/2019 Target Resolution Date: 04/11/2019 Goal Status: Active Interventions: Provide education on orientation to the wound center Notes: Wound/Skin Impairment Nursing Diagnoses: Impaired tissue integrity Goals: Ulcer/skin breakdown will have a volume reduction of 30% by week 4 Date Initiated: 03/13/2019 Target Resolution Date: 04/11/2019 Goal Status: Active Interventions: Assess ulceration(s) every visit Notes: Electronic Signature(s) Signed: 11/05/2019 5:22:48 PM By: Gretta Cool, BSN, RN, CWS, Kim RN, BSN Entered By: Gretta Cool, BSN, RN, CWS, Kim on 11/04/2019 13:03:40 [name] ([id number]) -------------------------------------------------------------------------------- Pain Assessment Details Patient Name: [name] Date of Service: 11/04/2019 12:45 PM Medical Record Number: [id number] Patient Account Number: [id number] Date of Birth/Sex: March 25, 1965 (54 y.o. M) Treating RN: Cornell Barman Primary Care Anyae Griffith: Berneta Sages Other Clinician: Referring Jamespaul Secrist: Berneta Sages Treating Alexsis Kathman/Extender: Skipper Cliche in Treatment: 33 Active Problems Location of Pain Severity and Description of Pain Patient Has Paino No Site Locations Pain Management and Medication Current Pain Management: Electronic Signature(s) Signed: 11/05/2019 5:22:48 PM By: Gretta Cool, BSN, RN, CWS, Kim RN, BSN Entered By: Gretta Cool, BSN, RN, CWS, Kim on 11/04/2019 12:54:50 [name] ([id number]) -------------------------------------------------------------------------------- Patient/Caregiver Education Details Patient Name: [name] Date of Service: 11/04/2019 12:45 PM Medical Record Number: [id number] Patient Account Number: [id number] Date of Birth/Gender: January 27, 1965 (54 y.o. M) Treating RN: Cornell Barman Primary Care Physician: Berneta Sages Other Clinician: Referring Physician: Berneta Sages Treating Physician/Extender:  Skipper Cliche in Treatment: 48 Education Assessment Education Provided To: Patient Education Topics Provided Wound Debridement: Handouts: Wound Debridement Methods: Demonstration, Explain/Verbal Responses: State content correctly Wound/Skin Impairment: Handouts: Caring for Your Ulcer, Other: continue wound care as prescribed Methods: Demonstration, Explain/Verbal Responses: State content correctly Electronic Signature(s) Signed: 11/05/2019 5:22:48 PM By: Gretta Cool, BSN, RN, CWS, Kim RN, BSN Entered By: Gretta Cool, BSN, RN, CWS, Kim on 11/04/2019 13:14:18 [name] ([id number]) -------------------------------------------------------------------------------- Wound Assessment Details Patient Name: [name], COMMISSO. Date of Service: 11/04/2019 12:45 PM Medical Record Number: [id number] Patient Account Number: [id number] Date of Birth/Sex: 1965/07/15 (54 y.o. M) Treating RN: Cornell Barman Primary Care Johncharles Fusselman: Berneta Sages Other Clinician: Referring Cartier Washko: Berneta Sages Treating Cybele Maule/Extender: Skipper Cliche in Treatment: 33 Wound Status Wound Number: 4R Primary Etiology: Diabetic Wound/Ulcer of the Lower Extremity Wound Location: Right, Lateral Foot Wound Status: Open Wounding Event: Gradually Appeared Comorbid History: Hypertension, Type II Diabetes, Neuropathy Date Acquired: 03/12/2019 Weeks Of Treatment: 33 Clustered Wound: No Pending Amputation On Presentation Photos Wound Measurements Length: (cm) 0.4 Width: (cm) 0.6 Depth: (cm) 0.3 Area: (cm) 0.188 Volume: (cm) 0.057 % Reduction in Area:  70.4% % Reduction in Volume: 55.1% Epithelialization: Small (1-33%) Tunneling: No Undermining: No Wound Description Classification: Grade 1 Wound Margin: Flat and Intact Exudate Amount: Small Exudate Type: Serosanguineous Exudate Color: red, brown Foul Odor After Cleansing: No Slough/Fibrino Yes Wound Bed Granulation Amount: Large (67-100%) Exposed  Structure Granulation Quality: Red Fascia Exposed: No Necrotic Amount: None Present (0%) Fat Layer (Subcutaneous Tissue) Exposed: Yes Tendon Exposed: No Muscle Exposed: No Joint Exposed: No Bone Exposed: No Treatment Notes Wound #4R (Right, Lateral Foot) Notes gentamycin, silver alginate, dry gauze, offloading felt, conform MARKEISE, [name] ([id number]) Electronic Signature(s) Signed: 11/05/2019 5:22:48 PM By: Gretta Cool, BSN, RN, CWS, Kim RN, BSN Entered By: Gretta Cool, BSN, RN, CWS, Kim on 11/04/2019 12:58:15 [name] ([id number]) -------------------------------------------------------------------------------- Vitals Details Patient Name: [name] Date of Service: 11/04/2019 12:45 PM Medical Record Number: [id number] Patient Account Number: [id number] Date of Birth/Sex: 1965-08-13 (54 y.o. M) Treating RN: Cornell Barman Primary Care Kriston Pasquarello: Berneta Sages Other Clinician: Referring Hulbert Branscome: Berneta Sages Treating Katheryn Culliton/Extender: Skipper Cliche in Treatment: 33 Vital Signs Time Taken: 12:54 Temperature (F): 98.1 Height (in): 74 Pulse (bpm): 78 Weight (lbs): 308 Respiratory Rate (breaths/min): 18 Body Mass Index (BMI): 39.5 Blood Pressure (mmHg): 170/78 Reference Range: 80 - 120 mg / dl Electronic Signature(s) Signed: 11/05/2019 5:22:48 PM By: Gretta Cool, BSN, RN, CWS, Kim RN, BSN Entered By: Gretta Cool, BSN, RN, CWS, Kim on 11/04/2019 12:54:44

## 2019-11-13 NOTE — Progress Notes (Shared)
[facility] Note  11/18/2019     CHIEF COMPLAINT Patient presents for No chief complaint on file.   HISTORY OF PRESENT ILLNESS: [name] is a 54 y.o. male who presents to the clinic today for:     Referring physician: [place], Heather OD [address] [place],  [id number]  HISTORICAL INFORMATION:   Selected notes from the MEDICAL RECORD NUMBER Referred by Dr. [place] LEE: 06.02.2021 BCVA OD: 20/40+2 OS: 20/30 Ocular Hx-laser with Dr. Baird Cancer for DR 2015 PMH-DM, HTN, cholesterol   CURRENT MEDICATIONS: No current outpatient medications on file. (Ophthalmic Drugs)   No current facility-administered medications for this visit. (Ophthalmic Drugs)   Current Outpatient Medications (Other)  Medication Sig  . Continuous Blood Gluc Sensor (FREESTYLE LIBRE 14 DAY SENSOR) MISC SMARTSIG:1 Topical Every 2 Weeks  . empagliflozin (JARDIANCE) 25 MG TABS tablet Take 25 mg by mouth daily.  . fluticasone (FLONASE ALLERGY RELIEF) 50 MCG/ACT nasal spray Place 2 sprays into both nostrils daily as needed for allergies or rhinitis.  Marland Kitchen glimepiride (AMARYL) 2 MG tablet Take 2 mg by mouth 2 (two) times daily.  . Insulin Degludec (TRESIBA FLEXTOUCH) 200 UNIT/ML SOPN Inject 100 Units into the skin daily before breakfast.   . levofloxacin (LEVAQUIN) 500 MG tablet Take 500 mg by mouth daily.  . metFORMIN (GLUCOPHAGE) 1000 MG tablet Take 1,000 mg by mouth 2 (two) times daily.  . Multiple Vitamin (MULTIVITAMIN) tablet Take 1 tablet by mouth daily.  . Olmesartan-Amlodipine-HCTZ 40-10-25 MG TABS Take 1 tablet by mouth daily. Reported on 02/04/2015  . omega-3 acid ethyl esters (LOVAZA) 1 g capsule Take 1 capsule by mouth 2 (two) times daily.  . rosuvastatin (CRESTOR) 10 MG tablet Take 10 mg by mouth daily.   No current facility-administered medications for this visit. (Other)      REVIEW OF SYSTEMS:    ALLERGIES No Known Allergies  PAST MEDICAL  HISTORY Past Medical History:  Diagnosis Date  . Broken ankle   . Broken arm   . Diabetes mellitus without complication ([place])    type 2  . Diabetic retinopathy ([place])    PDR OU  . History of Bell's palsy   . Hyperlipidemia   . Hypertension   . Osteomyelitis ([place])    foot   Past Surgical History:  Procedure Laterality Date  . AMPUTATION Right 03/04/2015   Procedure: PARTIAL AMPUTATION RIGHT 5TH METATARSAL;  Surgeon: Landis Martins, DPM;  Location: [facility];  Service: Podiatry;  Laterality: Right;  . APPENDECTOMY    . COLONOSCOPY  11/17/2005   TAs - [place]  . COLONOSCOPY N/A 01/25/2019   Procedure: COLONOSCOPY;  Surgeon: Juanita Craver, MD;  Location: [facility] ENDOSCOPY;  Service: Endoscopy;  Laterality: N/A;  . EYE SURGERY Bilateral    blood vessels -cautery  . HEMOSTASIS CLIP PLACEMENT  01/25/2019   Procedure: HEMOSTASIS CLIP PLACEMENT;  Surgeon: Juanita Craver, MD;  Location: [facility] ENDOSCOPY;  Service: Endoscopy;;  . I & D EXTREMITY Right 03/04/2015   Procedure: IRRIGATION AND DEBRIDEMENT RIGHT FOOT;  Surgeon: Landis Martins, DPM;  Location: [facility];  Service: Podiatry;  Laterality: Right;  . WISDOM TOOTH EXTRACTION     only 2 ext    FAMILY HISTORY Family History  Problem Relation Age of Onset  . Diabetes Father   . Colon cancer Father   . Colon polyps Father   . Esophageal cancer Neg Hx   . Rectal cancer Neg Hx   . Stomach  cancer Neg Hx     SOCIAL HISTORY Social History   Tobacco Use  . Smoking status: Never Smoker  . Smokeless tobacco: Never Used  Vaping Use  . Vaping Use: Never used  Substance Use Topics  . Alcohol use: No  . Drug use: No         OPHTHALMIC EXAM:  Not recorded     IMAGING AND PROCEDURES  Imaging and Procedures for 11/18/2019           ASSESSMENT/PLAN:  No diagnosis found.  1,2. 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 Baird Cancer -- pt has not been seen at [facility] since 2015  - history of  extensive PRP OU and PPV OU ~2011-2015 - The incidence, risk factors for progression, natural history and treatment options for diabetic retinopathy were discussed with patient.   - The need for close monitoring of blood glucose, blood pressure, and serum lipids, avoiding cigarette or any type of tobacco, and the need for long term follow up was also discussed with patient. - exam shows extensive PRP and no active NV, minimal retinopathy - FA today (07.14.21) shows late-leaking MA, vascular nonperfusion, no NV OU - OCT without diabetic macular edema, both eyes  - discussed findings and prognosis - f/u in 4 months, DFE, OCT  3,4. Hypertensive retinopathy OU - discussed importance of tight BP control - monitor  5. Mixed Cataract OU - The symptoms of cataract, surgical options, and treatments and risks were discussed with patient. - discussed diagnosis and progression - not yet visually significant - monitor for now    Ophthalmic Meds Ordered this visit:  No orders of the defined types were placed in this encounter.      No follow-ups on file.  There are no Patient Instructions on file for this visit.   Explained the diagnoses, plan, and follow up with the patient and they expressed understanding.  Patient expressed understanding of the importance of proper follow up care.   This document serves as a record of services personally performed by Gardiner Sleeper, MD, PhD. It was created on their behalf by Estill Bakes, COT an ophthalmic technician. The creation of this record is the provider's dictation and/or activities during the visit.    Electronically signed by: Estill Bakes, COT 11.10.21 @ 9:29 AM  Abbreviations: M myopia (nearsighted); A astigmatism; H hyperopia (farsighted); P presbyopia; Mrx spectacle prescription;  CTL contact lenses; OD right eye; OS left eye; OU both eyes  XT exotropia; ET esotropia; PEK punctate epithelial keratitis; PEE punctate epithelial erosions;  DES dry eye syndrome; MGD meibomian gland dysfunction; ATs artificial tears; PFAT's preservative free artificial tears; [place] nuclear sclerotic cataract; PSC posterior subcapsular cataract; ERM epi-retinal membrane; PVD posterior vitreous detachment; RD retinal detachment; DM diabetes mellitus; DR diabetic retinopathy; NPDR non-proliferative diabetic retinopathy; PDR proliferative diabetic retinopathy; CSME clinically significant macular edema; DME diabetic macular edema; dbh dot blot hemorrhages; CWS cotton wool spot; POAG primary open angle glaucoma; C/D cup-to-disc ratio; HVF humphrey visual field; GVF goldmann visual field; OCT optical coherence tomography; IOP intraocular pressure; BRVO Branch retinal vein occlusion; CRVO central retinal vein occlusion; CRAO central retinal artery occlusion; BRAO branch retinal artery occlusion; RT retinal tear; SB scleral buckle; PPV pars plana vitrectomy; VH Vitreous hemorrhage; PRP panretinal laser photocoagulation; IVK intravitreal kenalog; VMT vitreomacular traction; MH Macular hole;  NVD neovascularization of the disc; NVE neovascularization elsewhere; AREDS age related eye disease study; ARMD age related macular  degeneration; POAG primary open angle glaucoma; EBMD epithelial/anterior basement membrane dystrophy; ACIOL anterior chamber intraocular lens; IOL intraocular lens; PCIOL posterior chamber intraocular lens; Phaco/IOL phacoemulsification with intraocular lens placement; [place] photorefractive keratectomy; LASIK laser assisted in situ keratomileusis; HTN hypertension; DM diabetes mellitus; COPD chronic obstructive pulmonary disease

## 2019-11-18 ENCOUNTER — Encounter: Payer: Managed Care, Other (non HMO) | Admitting: Physician Assistant

## 2019-11-18 ENCOUNTER — Encounter (INDEPENDENT_AMBULATORY_CARE_PROVIDER_SITE_OTHER): Payer: Managed Care, Other (non HMO) | Admitting: Ophthalmology

## 2019-11-18 ENCOUNTER — Other Ambulatory Visit: Payer: Self-pay

## 2019-11-18 DIAGNOSIS — H25813 Combined forms of age-related cataract, bilateral: Secondary | ICD-10-CM

## 2019-11-18 DIAGNOSIS — I10 Essential (primary) hypertension: Secondary | ICD-10-CM

## 2019-11-18 DIAGNOSIS — E113553 Type 2 diabetes mellitus with stable proliferative diabetic retinopathy, bilateral: Secondary | ICD-10-CM

## 2019-11-18 DIAGNOSIS — H35033 Hypertensive retinopathy, bilateral: Secondary | ICD-10-CM

## 2019-11-18 DIAGNOSIS — H3581 Retinal edema: Secondary | ICD-10-CM

## 2019-11-18 DIAGNOSIS — E11621 Type 2 diabetes mellitus with foot ulcer: Secondary | ICD-10-CM | POA: Diagnosis not present

## 2019-11-18 NOTE — Progress Notes (Addendum)
[name], [name] ([id number]) Visit Report for 11/18/2019 Chief Complaint Document Details Patient Name: [name], [name]. Date of Service: 11/18/2019 8:15 AM Medical Record Number: [id number] Patient Account Number: [id number] Date of Birth/Sex: 24-Jul-1965 (54 y.o. M) Treating RN: Cornell Barman Primary Care Provider: Berneta Sages Other Clinician: Referring Provider: Berneta Sages Treating Provider/Extender: Skipper Cliche in Treatment: 63 Information Obtained from: Patient Chief Complaint Right lateral foot ulcer Electronic Signature(s) Signed: 11/18/2019 8:37:47 AM By: [name] PA-C Entered By: [name] on 11/18/2019 08:37:47 [name] ([id number]) -------------------------------------------------------------------------------- Debridement Details Patient Name: [name] Date of Service: 11/18/2019 8:15 AM Medical Record Number: [id number] Patient Account Number: [id number] Date of Birth/Sex: May 14, 1965 (54 y.o. M) Treating RN: Cornell Barman Primary Care Provider: Berneta Sages Other Clinician: Referring Provider: Berneta Sages Treating Provider/Extender: Skipper Cliche in Treatment: 35 Debridement Performed for Wound #4R Right,Lateral Foot Assessment: Performed By: Physician Tommie Sams., PA-C Debridement Type: Debridement Severity of Tissue Pre Debridement: Fat layer exposed Level of Consciousness (Pre- Awake and Alert procedure): Pre-procedure Verification/Time Out Yes - 09:00 Taken: Total Area Debrided (L x W): 0.5 (cm) x 1.6 (cm) = 0.8 (cm) Tissue and other material Viable, Non-Viable, Callus, Slough, Subcutaneous, Skin: Dermis , Slough debrided: Level: Skin/Subcutaneous Tissue Debridement Description: Excisional Instrument: Curette Bleeding: Moderate Hemostasis Achieved: Pressure Response to Treatment: Procedure was tolerated well Level of Consciousness (Post- Awake and Alert procedure): Post Debridement Measurements of  Total Wound Length: (cm) 0.6 Width: (cm) 1.2 Depth: (cm) 0.4 Volume: (cm) 0.226 Character of Wound/Ulcer Post Debridement: Stable Severity of Tissue Post Debridement: Fat layer exposed Post Procedure Diagnosis Same as Pre-procedure Electronic Signature(s) Signed: 11/18/2019 5:58:45 PM By: [name] PA-C Signed: 11/20/2019 5:12:38 PM By: Gretta Cool, BSN, RN, CWS, Kim RN, BSN Entered By: Gretta Cool, BSN, RN, CWS, Kim on 11/18/2019 09:08:56 [name] ([id number]) -------------------------------------------------------------------------------- HPI Details Patient Name: [name] Date of Service: 11/18/2019 8:15 AM Medical Record Number: [id number] Patient Account Number: [id number] Date of Birth/Sex: 1965-06-27 (54 y.o. M) Treating RN: Cornell Barman Primary Care Provider: Berneta Sages Other Clinician: Referring Provider: Berneta Sages Treating Provider/Extender: Skipper Cliche in Treatment: 35 History of Present Illness HPI Description: This 54 year old male comes with an ulcerated area on the plantar aspect of the right foot which she's had for approximately a month. I have known him from a previous visit at [facility] wound center and was treated in the months of April and May 2016 and rapidly healed a left plantar ulcer with a total contact cast. He has been a diabetic for about 16 years and tries to keep active and is fairly compliant with his diabetes management. 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 Past medical history significant for hypertension, hyperlipidemia, and status post appendectomy 1993. He does not smoke or drink alcohol. 01/14/2015 -- the patient had tolerated his total contact cast very well and had no problems and has had no systemic symptoms. However when his total contact cast was cut open he had excessive amount of purulent drainage in spite of being on antibiotics. He had had a recent x-ray done in the ER 12/22/2014 which showed IMPRESSION:No  evidence of osseous erosion. Known soft tissue ulceration is not well characterized on radiograph. Scattered vascular calcifications seen. his last hemoglobin A1c in December was 7.3. He has been on Augmentin and doxycycline for the last 2 weeks. 01/21/2015 -- his culture grew rare growth of Pantoea species an MR moderate growth of Candida parapsilosis. it is sensitive  to levofloxacin. He has not heard back from the insurance company regarding his hyperbaric oxygen therapy. His MRI has not been done yet and we will try and get him an earlier date 01/28/2015 -- MRI was done last night -- IMPRESSION:1. Soft tissue ulcer overlying the plantar aspect of the fifth metatarsal head extending to the cortex. Subcortical marrow edema in the fifth metatarsal head with corresponding T1 hypointensity is concerning for early osteomyelitis of the plantar lateral aspect of the fifth metatarsal head. Chest x-ray done on 01/14/2015 shows bronchiectatic changes without infiltrate. EKG done on generally 17 2017 shows a normal sinus rhythm and is a normal EKG. 02/04/2015 -- he was asked to see Dr. [name] last week and had 2 appointments but had to cancel both due to pressures of work. Last night he has woken up with severe pain in the foot and leg and it is swollen up. No fever or no change in his blood glucose. Addendum: I spoke with Dr. [name] who kindly agreed to accept the patient for inpatient therapy and have also opened to the hospitalist Dr. [name], and discuss details of the management including PICC line and repeat cultures. 02/12/2015-- -- was seen by Dr. [name] in the hospital and a PICC line was placed. He was to receive Ceftazidime 2 g every 12 hourly, oral levofloxacin 750 mg every 24 hourly and oral fluconazole 200 mg daily. The antibiotics were to be given for 4 weeks except the Diflucan was to be given for the first 2 weeks. Reviewed note from 02/10/2015 -- and Dr. [name] had recommended  management for growth of MSSA and Serratia. He switched him from ceftazidime to ceftriaxone 2 g every 24 hours. Levofloxacin was stopped and he would continue on fluconazole for another week. He had asked me to decide whether further imaging was necessary and whether surgical debridement of the infected bone was needed. He is doing well and has been off work for this week and we will keep him off the next week. 02/22/2015 -- he was seen by my colleague on 01/19/2015 and at that time an incision and drainage was done on his right lateral forefoot on the dorsum. Today when I probed this wound it is frankly draining pus and it communicates with the ulcer on the plantar aspect of his right foot. The patient is still on IV ceftriaxone 2 g every 24 hours and is to be seen by Dr. [name] on Friday. 03/19/2015 -- On 03/04/2015 I spoke to Dr. 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 He recommended operative debridement which would probably result in the fifth metatarsal head and toe amputation.The patient would be referred back to [place] once he was done with surgery. He was admitted to [facility] yesterday and had surgery done by podiatry for a right fifth metatarsal acute osteomyelitis with cellulitis and abscess. He had a right foot incision and drainage with fifth metatarsal partial amputation and removal of toe infected bone and soft tissue with cultures. The wound was partially closed and packing of the distal end was done. Patient was already on cefepime 2 g IV every 8 hourly and put on vancomycin pending final cultures. He had grown moderate gram-negative rods, and later found to be rare diphtheroids. I received a call from Dr. Cannon Kettle the podiatrist and we  discussed the above. On 03/10/2015 he was found positive for influenza a and  has been put on Tamiflu. Since his discharge he has been seen by Dr. Cannon Kettle who is planning to remove his sutures this coming week. He was reviewed by Dr. [name] on 03/17/2015 and his Diflucan was stopped and vancomycin. After the 3 doses he is taking. He is going to change Ceftazidime to Zosyn 3.375 g IV every 8 hours. 03/25/2015 -- he was seen by the podiatrist a couple of days ago and the sutures were removed. She will follow back with him in 4 weeks' time and at that time x-rays will be taken and a custom molded insert would be made for his shoe. 04/01/2015 -- he was seen by Dr. [name] on 03/29/2015 who pulled the PICC line stopped his IV antibiotics and recommended starting doxycycline and levofloxacin for 2 weeks. He also stop the fluconazole. The patient will follow up with him only when necessary. Readmission: 03/15/17 on evaluation today patient presents for initial evaluation concerning the new issues although he has previously been evaluated in our clinic. Unfortunately the previous evaluation led to the patient having to proceed to amputation and so he is somewhat nervous about being here [name], [name] ([id number]) today. With that being said he has a very slight blister that occurred on the plantar aspect more medial on the right great toe that has been present for just a very short amount of time, several days. With that being said he felt like initially when he called that he somewhat overreacted but due to the fact that his previous issue led to amputation he is very cautious these days I explained that he did the right thing. With that being said he has been tolerating the dressing changes without complication mainly he just been covering this. He does not have any discomfort in secondary to neuropathy it's unlikely that he feels much. With that being said he does tell me that the issue that he had here is that he went barefoot when he knows he should not have which  subsequently led to the blisters. He states is definitely not doing that anymore. His most recent hemoglobin A1c was December and registered 6.9 his ABI today was 1.1. 03/27/17 on evaluation today patient appears to be doing great in regard to his right great toe ulcer. He has been tolerating the dressing changes without complication. The good news is this is making excellent progress and he seems to be caring for this in an excellent fashion. I see no evidence of breakdown that would make me concerned that he was at risk for infection/amputation. This has obviously been his concern due to the fifth toe amputation that was necessitated previous when he had a similar issue. Nonetheless this seems to be progressing much more nicely. Readmission: 03/13/2019 upon evaluation today patient presents for reevaluation here in the clinic concerning issues he has been having with his right foot on the lateral portion at the proximal end of the metatarsal. Subsequently he tells me that this just opened up in the past couple of days or so and the erythema really began about 24 to 48 hours ago. Fortunately there is no signs of systemic infection. 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 That left him with this bony prominence that is where the wound is currently. There is erythema surrounding that does have me concerned about cellulitis. With that being said he was noncompressible  as far as ABIs are concerned I do think we can need to check into arterial studies at this point. Patient does have a history of hypertension along with diabetes mellitus type 2. He also tends to develop quite a bit of callus white often. 03/20/2019 upon evaluation today patient appears to be doing very well in regard to his wounds currently. He has been tolerating the dressing changes without complication. Fortunately there is no signs of infection. His ABIs were good at this point and  registered at 1.07 on the left and 1.09 on the right. In regard to the x-ray this was negative as well for any signs of osteomyelitis. The patient also seems to be doing better in regard to the infection. He still has several days of the antibiotic left at this point best the Bactrim DS and he seems to be doing very well with this. Overall I am extremely pleased with the progress in 1 week's time he is going require some sharp debridement today however. 03/27/2019 upon evaluation today patient actually appears to be making some progress in regard to his wound. It is a little deeper but measuring smaller which is good news due to the fact that again were clear away some of the necrotic tissue which is why the depth is increasing a little bit. Nonetheless after like were getting very close to being down at a good wound bed. Fortunately there is no signs of active infection at this time. There is a little bit of erythema immediately surrounding the wound that we do want to be very cognizant of and careful about. For that reason I am going to go ahead and extend his antibiotic today for an additional 10 days that is the Bactrim. 04/03/2019 upon evaluation today patient appears to be making some progress. I do feel like the Annitta Needs is doing a good job along with the alginate is being packed into the wound space behind. He has been tolerating the dressing changes without complication. Fortunately there is no signs of active infection at this time. No fevers, chills, nausea, vomiting, or diarrhea. 04/10/2019 upon evaluation today patient appears to be making progress. The wound is not quite as deep but he still does not have a great wound surface yet for working on this and the Santyl does seem to be cleaning things up. Fortunately there is no evidence of active infection at this time. No fevers, chills, nausea, vomiting, or diarrhea. 04/17/2019 upon evaluation today patient appears to be doing excellent at this  time in regard to his foot ulcer. I do believe the Santyl with the alginate packed in behind has been of benefit for him and I am very pleased in this regard. With that being said I am feeling like the wound surface is improving quite a bit each time I see him still I do not believe we are at the point of stating that the wound bed is perfect but again were making progress. The patient is going require some sharp debridement today. 4/22; this is a patient who is using Santyl with calcium alginate. Apparently his wound dimensions have been improving. Patient is changing the dressing himself. He has a surgical shoe. He works in a sitting position therefore is not on his feet all that much. There is undermining 05/01/2019 upon evaluation today patient appears to be doing a little better in regard to the size of his wound though he still has some depth. This does seem to be much cleaner has been  using Santyl on the base of the wound followed by packing with silver alginate and behind. Nonetheless I do believe that based on what we are seeing we may need to switch to a collagen type dressing endoform may actually be excellent for him. 05/08/2019 upon evaluation today patient's wound actually appears to be showing better granulation tissue in the base of the wound. With that being said he does have some epiboly noted around the edges of the wound especially plantar and more distal. Subsequently this is can require sharp debridement today. Fortunately there is no signs of active systemic infection though I do feel like there is some local cellulitis noted at this point. The patient also notes has had increased drainage. 5/13; wound volume quite a bit improved this week down to 3 mm. Patient states pain and drainage better. Culture from last week grew staph aureus [not MRSA] and this is sensitive to quinolones and he is on Levaquin. HOWEVER he also grew abundant Enterococcus faecalis treatment of choice for this  is ampicillin. Quinolone coverage is unreliable 5/20 completing the Augmentin. Small wound on the right lateral foot. Thick rolled edges around the wound. Using silver alginate 05/29/2019 upon evaluation today patient appears to be doing better compared to last time I saw him. Fortunately there is no signs of active infection and I do feel like he is actually making good progress. Overall the quality of the wound bed along with the size looks improved and the inflammation/erythema that was previously noted by myself when I saw the couple weeks back actually has also resolved and this is great. Overall very pleased with how things stand. 06/06/2019 upon evaluation today patient actually appears to be doing quite well with regard to his foot ulcer with regard to some of the granulation were seen at this point. There does not appear to be any evidence of systemic or local infection although he still has discomfort I feel like things are moving in a slow but surely correct direction. I think we may need to help fill some of the space however even though he is using the collagen we may need to have something behind this such as a plain packing strip to try to help hold the collagen in the base of the wound. 06/12/2019 upon evaluation today patient appears to be doing well with regard to his foot ulcer. This seems to be making some slow but steady progress. The wound does not appear to be as deep today compared to where it was previous. Fortunately there is no signs of active infection at this time. No fevers, chills, nausea, vomiting, or diarrhea. 06/19/2019 on evaluation today patient's wound does not appear to be doing quite as well as what I would like to see. Fortunately there is no signs of systemic infection at this time which is great news. With that being said I do believe that the patient needs to have a wound culture as I do [name], [name]. ([id number]) believe he likely has a local infection  that needs to be addressed. He is also can require some debridement today as well. 06/26/2019 upon evaluation today patient's wound actually appears to be showing signs of excellent improvement today. There does not appear to be any evidence of active infection which is great news. There does not appear to be significant erythema at this point. The patient does have improvement overall in the appearance of the infection he has been taking the Augmentin the culture came back showing no organism  was grown at this point but nonetheless I do believe he has been doing well with the antibiotics I would continue that at this point. 07/03/2019 upon evaluation today patient appears to be doing about the same in regard to his wound. There does not appear to be any evidence of active infection and overall I am pleased with where things stand. The patient is tolerating the dressing changes without complication but it does not appear that they are packing the wound at this time which I think is going to slow down his healing prospects. 07/10/2019 upon evaluation today patient appears to be doing little better in my opinion in regard to the overall moisture collected in the base of the wound. They have been packing this with a little bit of silver alginate and that has done quite well. Overall I am pleased in that regard. Subsequently the redness does not appear to be any worse today I did actually go ahead and marked this since this can be 2 weeks between now and when I see him again I would make sure that if anything changes or worsens he will be able to notice this and go ahead and initiate treatment with the Augmentin. 07/22/2019 patient comes in today a little bit early for evaluation secondary to the fact that he was having issues with increased drainage and concern about infection. With that being said he does appear to have developed a blister area again proximal to where the original wound opening was in  although the wound bed itself actually appears to be doing somewhat better he has had increased drainage and tracking of fluid here due to this blister. Nonetheless I believe that that does need to be cleaned away today but again I am not really certain that I see a lot of evidence for infection at this point. 07/29/2019 on evaluation today patient actually appears to be doing better in regard to his foot ulcer. There is improvement compared to last week which is great news. Nonetheless he does come prepared to apply the total contact cast today which I think would be beneficial for him. He knows he is not supposed to drive he did bring his son with him today in order to drive him. 07/31/2019 upon evaluation today patient appears to be doing well with regard to his wound today. I do not see any signs of infection I do believe that he is definitely showing some signs of improvement here in the office in a couple days as far as the overall erythema and pressure getting to the wound area is concerned. With that being said I do believe that the cast has been of great benefit for him. 08/08/2019 upon evaluation today patient appears to be doing better in regard to his wound. Overall I feel like he is showing signs of improvement. The external measurement is really not bad at all although I did remove some of the callus in order to open up the wound itself the depth is actually coming quite well and I am extremely pleased in that regard I do not think we can to see much improvement externally until we get the internal depth filled in and that again is improved. Fortunately there is no signs of active infection at this time. No fevers, chills, nausea, vomiting, or diarrhea. 08/15/2019 on evaluation today patient actually appears to be doing well with regard to his foot ulcer. This is showing some signs of minimal improvement from the standpoint of the depth and appearance  of the wound as well as the edges of the  wound. It is taking its time and to be honest I do believe that he seems to be progressing well. I am happy with the cast and what it is achieving at this point. Have contemplated checking into Dermagraft for Apligraf for the patient but again he does seem to be making fairly good progress in my opinion. If it has not tremendously changed come next week I may consider one of the 2. 08/22/2019 on evaluation today patient appears to be making good progress with regard to his wound I am still very pleased with where things stand today. Overall I think that he is making good improvements week by week and I believe that we are getting continue with the Total contact cast I will likely switch to a silver alginate dressing. 08/29/2019 on evaluation today patient appears to be doing quite well with regard to his wound. Fortunately this is measuring quite small. There is no signs of active infection at this time although he did have a little bit of a callus area just to the side of his foot where we previously noted some fluid collecting underneath that was the case today as well. I did have to remove this. With that being said overall he seems to be doing quite well. 09/05/19 on evaluation today patient appears to be doing very well with regard to his original wound which is actually very small on evaluation today. With that being said he does have a blistered area that's more proximal to where this wound is that has been a little bit more concerned. Fortunately there is no signs of active infection at this time. I'm going to need to remove the blister tissue today. 10 09/12/2019 on evaluation today patient actually appears to be doing much better in regard to his wound. I think he is actually improved more without the cast that he was prior with the cast. Overall very pleased with where things stand today. 09/22/2019 upon evaluation today patient appears to be doing very well at this time in regard to his foot  ulcer. In fact I feel like he is continue to make good progress. I see no signs right now that there is any active infection which is great news. Also see very little evidence of pressure he does have some callus buildup but again this seems to be minimal. 09/29/2019 upon evaluation today patient appears to be doing well with regard to his foot ulcer. He has been tolerating the dressing changes without complication. Fortunately there is no signs of active infection at this time. No fevers, chills, nausea, vomiting, or diarrhea. 10/28/2019 upon evaluation today patient appears to be doing a little bit more poorly than last time I saw him as last time we assumed he was completely healed. Apparently that was not the case or else this is reopened but I tend to believe that this probably has never completely closed internally though it appeared to be. He has been using the offloading felt which seems to have done well there is no signs of redness no evidence of erythema all of which is good news. Nonetheless I am concerned about the fact that he is having issues here with what appears to be a reopening of the wound. 11/04/2019 upon evaluation today patient's wound actually showed signs of fairly good improvement today which is great news the wound bed does not appear to be significantly callus covered and also does not appear to  show any signs of significant infection or really any infection whatsoever. I am very pleased in this regard. In general I think that he is making good progress although this is a lot slower than he would like to see I think we are headed back in the correct direction. He is continuing to use the offloading felt. 11/18/2019 on evaluation today patient appears to be having some callus buildup and cracking that occurred at the wound site. Subsequently actually did have to perform a fairly significant debridement on the area to clean away some of the callus buildup he tolerated that  today with some bleeding with that being said I do feel like the wound appear to be doing much better post debridement. [name], [name] ([id number]) Electronic Signature(s) Signed: 11/18/2019 5:52:26 PM By: [name] PA-C Entered By: [name] on 11/18/2019 17:52:25 [name], [name] ([id number]) -------------------------------------------------------------------------------- Physical Exam Details Patient Name: [name], [name] Date of Service: 11/18/2019 8:15 AM Medical Record Number: [id number] Patient Account Number: [id number] Date of Birth/Sex: 03-Sep-1965 (54 y.o. M) Treating RN: Cornell Barman Primary Care Provider: Berneta Sages Other Clinician: Referring Provider: Berneta Sages Treating Provider/Extender: Skipper Cliche in Treatment: 47 Constitutional Well-nourished and well-hydrated in no acute distress. Respiratory normal breathing without difficulty. Psychiatric this patient is able to make decisions and demonstrates good insight into disease process. Alert and Oriented x 3. pleasant and cooperative. Notes Again patient's wound actually does show signs of callus around the edges of the wound which required sharp debridement today. I performed this debridement without complication other than some bleeding right in the central portion of the wound which I did have to use chemical cauterization with silver nitrate. He tolerated that today without complication post debridement the wound bed appears to be doing much better which is great news. Electronic Signature(s) Signed: 11/18/2019 5:53:00 PM By: [name] PA-C Entered By: [name] on 11/18/2019 17:52:59 [name] ([id number]) -------------------------------------------------------------------------------- Physician Orders Details Patient Name: [name] Date of Service: 11/18/2019 8:15 AM Medical Record Number: [id number] Patient Account Number: [id number] Date of  Birth/Sex: 1965-01-06 (54 y.o. 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 - in office o Dial antibacterial soap, wash wounds, rinse and pat dry prior to dressing wounds Anesthetic (add to Medication List) Wound #4R Right,Lateral Foot o Topical Lidocaine 4% cream applied to wound bed prior to debridement (In Clinic Only). Primary Wound Dressing Wound #4R Right,Lateral Foot o Gentamicin Sulfate Cream o Silver Alginate - Thin layer of gentamicin under the alginate Secondary Dressing Wound #4R Right,Lateral Foot o Conform/Kerlix - Offloading felt donut, dry gauze, roll gauze to cover Dressing Change Frequency Wound #4R Right,Lateral Foot o Change Dressing Monday, Wednesday, Friday Follow-up Appointments Wound #4R Right,Lateral Foot o Return Appointment in 1 week. Electronic Signature(s) Signed: 11/18/2019 5:58:45 PM By: [name] PA-C Signed: 11/20/2019 5:12:38 PM By: Gretta Cool, BSN, RN, CWS, Kim RN, BSN Entered By: Gretta Cool, BSN, RN, CWS, Kim on 11/18/2019 20:25:42 [name] ([id number]) -------------------------------------------------------------------------------- Problem  List Details Patient Name: RAVI, TUCCILLO. Date of Service: 11/18/2019 8:15 AM Medical Record Number: [id number] Patient Account Number: [id number] Date of Birth/Sex: 12-17-1965 (54 y.o. 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 M) Treating RN: Cornell Barman Primary Care Provider: Berneta Sages Other Clinician: Referring Provider: Berneta Sages Treating Provider/Extender: Skipper Cliche in Treatment: 35 Subjective Chief Complaint Information obtained from Patient Right lateral foot ulcer History of Present Illness (HPI) This 54 year old male comes with an ulcerated area on the plantar aspect of the right foot which she's had for approximately a month. I have known him from a previous visit at [facility] wound center and was treated in the months of April and May 2016 and rapidly healed a left plantar ulcer with a total contact cast. He has been a diabetic for about 16 years and tries to keep active and is fairly compliant with his diabetes management. 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 Past medical history significant for  hypertension, hyperlipidemia, and status post appendectomy 1993. He does not smoke or drink alcohol. 01/14/2015 -- the patient had tolerated his total contact cast very well and had no problems and has had no systemic symptoms. However when his total contact cast was cut open he had excessive amount of purulent drainage in spite of being on antibiotics. He had had a recent x-ray done in the ER 12/22/2014 which showed IMPRESSION:No evidence of osseous erosion. Known soft tissue ulceration is not well characterized on radiograph. Scattered vascular calcifications seen. his last hemoglobin A1c in December was 7.3. He has been on Augmentin and doxycycline for the last 2 weeks. 01/21/2015 -- his culture grew rare growth of Pantoea species an MR moderate growth of Candida parapsilosis. it is sensitive to levofloxacin. He has not heard back from the insurance company regarding his hyperbaric oxygen therapy. His MRI has not been done yet and we will try and get him an earlier date 01/28/2015 -- MRI was done last night -- IMPRESSION:1. Soft tissue ulcer overlying the plantar aspect of the fifth metatarsal head extending to the cortex. Subcortical marrow edema in the fifth metatarsal head with corresponding T1 hypointensity is concerning for early osteomyelitis of the plantar lateral aspect of the fifth metatarsal head. Chest x-ray done on 01/14/2015 shows bronchiectatic changes without infiltrate. EKG done on generally 17 2017 shows a normal sinus rhythm and is a normal EKG. 02/04/2015 -- he was asked to see Dr. [name] last week and had 2 appointments but had to cancel both due to pressures of work. Last night he has woken up with severe pain in the foot and leg and it is swollen up. No fever or  no change in his blood glucose. Addendum: I spoke with Dr. [name] who kindly agreed to accept the patient for inpatient therapy and have also opened to the hospitalist Dr. [name], and discuss details of the  management including PICC line and repeat cultures. 02/12/2015-- -- was seen by Dr. [name] in the hospital and a PICC line was placed. He was to receive Ceftazidime 2 g every 12 hourly, oral levofloxacin 750 mg every 24 hourly and oral fluconazole 200 mg daily. The antibiotics were to be given for 4 weeks except the Diflucan was to be given for the first 2 weeks. Reviewed note from 02/10/2015 -- and Dr. [name] had recommended management for growth of MSSA and Serratia. He switched him from ceftazidime to ceftriaxone 2 g every 24 hours. Levofloxacin was stopped and he would continue on fluconazole for another week. He had asked me to decide whether further imaging was necessary and whether surgical debridement of the infected bone was needed. He is doing well and has been off work for this week and we will keep him off the next week. 02/22/2015 -- he was seen by my colleague on 01/19/2015 and at that time an incision and drainage was done on his right lateral forefoot on the dorsum. Today when I probed this wound it is frankly draining pus and it communicates with the ulcer on the plantar aspect of his right foot. The patient is still on IV ceftriaxone 2 g every 24 hours and is to be seen by Dr. [name] on Friday. 03/19/2015 -- On 03/04/2015 I spoke to Dr. 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 He recommended operative debridement which would probably result in the fifth metatarsal head and toe amputation.The patient would be referred back to [place] once he was done with surgery. He was admitted to [facility] yesterday and had surgery done by podiatry for a right fifth metatarsal acute osteomyelitis with cellulitis and abscess. He had a right foot incision and drainage with fifth metatarsal partial amputation and removal of  toe infected bone and soft tissue with cultures. The wound was partially closed and packing of the distal end was done. Patient was already on cefepime 2 g IV every 8 hourly and put on vancomycin pending final cultures. He had grown moderate gram-negative rods, and later found to be rare diphtheroids. I received a call from Dr. Cannon Kettle the podiatrist and we discussed the above. On 03/10/2015 he was found positive for influenza a and has been put on Tamiflu. Since his discharge he has been seen by Dr. Cannon Kettle who is planning to remove his sutures this coming week. He was reviewed by Dr. [name] on 03/17/2015 and his Diflucan was stopped and vancomycin. After the 3 doses he is taking. He is going to change Ceftazidime to Zosyn 3.375 g IV every 8 hours. 03/25/2015 -- he was seen by the podiatrist a couple of days ago and the sutures were removed. She will follow back with him in 4 weeks' time and at that time x-rays will be taken and a custom molded insert would be made for his shoe. 04/01/2015 -- he was seen by Dr. [name] on 03/29/2015 who pulled the PICC line stopped his IV antibiotics and recommended starting doxycycline and levofloxacin for 2 weeks. He also stop the fluconazole. The patient will  follow up with him only when necessary. [name], [name] ([id number]) Readmission: 03/15/17 on evaluation today patient presents for initial evaluation concerning the new issues although he has previously been evaluated in our clinic. Unfortunately the previous evaluation led to the patient having to proceed to amputation and so he is somewhat nervous about being here today. With that being said he has a very slight blister that occurred on the plantar aspect more medial on the right great toe that has been present for just a very short amount of time, several days. With that being said he felt like initially when he called that he somewhat overreacted but due to the fact that his previous issue  led to amputation he is very cautious these days I explained that he did the right thing. With that being said he has been tolerating the dressing changes without complication mainly he just been covering this. He does not have any discomfort in secondary to neuropathy it's unlikely that he feels much. With that being said he does tell me that the issue that he had here is that he went barefoot when he knows he should not have which subsequently led to the blisters. He states is definitely not doing that anymore. His most recent hemoglobin A1c was December and registered 6.9 his ABI today was 1.1. 03/27/17 on evaluation today patient appears to be doing great in regard to his right great toe ulcer. He has been tolerating the dressing changes without complication. The good news is this is making excellent progress and he seems to be caring for this in an excellent fashion. I see no evidence of breakdown that would make me concerned that he was at risk for infection/amputation. This has obviously been his concern due to the fifth toe amputation that was necessitated previous when he had a similar issue. Nonetheless this seems to be progressing much more nicely. Readmission: 03/13/2019 upon evaluation today patient presents for reevaluation here in the clinic concerning issues he has been having with his right foot on the lateral portion at the proximal end of the metatarsal. Subsequently he tells me that this just opened up in the past couple of days or so and the erythema really began about 24 to 48 hours ago. Fortunately there is no signs of systemic infection. 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 That left him with this bony prominence that is where the wound is currently. There is erythema surrounding that does have me concerned about cellulitis. With that being said he was noncompressible as far as ABIs are concerned I do think we can need to  check into arterial studies at this point. Patient does have a history of hypertension along with diabetes mellitus type 2. He also tends to develop quite a bit of callus white often. 03/20/2019 upon evaluation today patient appears to be doing very well in regard to his wounds currently. He has been tolerating the dressing changes without complication. Fortunately there is no signs of infection. His ABIs were good at this point and registered at 1.07 on the left and 1.09 on the right. In regard to the x-ray this was negative as well for any signs of osteomyelitis. The patient also seems to be doing better in regard to the infection. He still has several days of the antibiotic left at this point best the Bactrim DS and he seems to be doing very well with this. Overall I am extremely pleased with  the progress in 1 week's time he is going require some sharp debridement today however. 03/27/2019 upon evaluation today patient actually appears to be making some progress in regard to his wound. It is a little deeper but measuring smaller which is good news due to the fact that again were clear away some of the necrotic tissue which is why the depth is increasing a little bit. Nonetheless after like were getting very close to being down at a good wound bed. Fortunately there is no signs of active infection at this time. There is a little bit of erythema immediately surrounding the wound that we do want to be very cognizant of and careful about. For that reason I am going to go ahead and extend his antibiotic today for an additional 10 days that is the Bactrim. 04/03/2019 upon evaluation today patient appears to be making some progress. I do feel like the Annitta Needs is doing a good job along with the alginate is being packed into the wound space behind. He has been tolerating the dressing changes without complication. Fortunately there is no signs of active infection at this time. No fevers, chills, nausea, vomiting,  or diarrhea. 04/10/2019 upon evaluation today patient appears to be making progress. The wound is not quite as deep but he still does not have a great wound surface yet for working on this and the Santyl does seem to be cleaning things up. Fortunately there is no evidence of active infection at this time. No fevers, chills, nausea, vomiting, or diarrhea. 04/17/2019 upon evaluation today patient appears to be doing excellent at this time in regard to his foot ulcer. I do believe the Santyl with the alginate packed in behind has been of benefit for him and I am very pleased in this regard. With that being said I am feeling like the wound surface is improving quite a bit each time I see him still I do not believe we are at the point of stating that the wound bed is perfect but again were making progress. The patient is going require some sharp debridement today. 4/22; this is a patient who is using Santyl with calcium alginate. Apparently his wound dimensions have been improving. Patient is changing the dressing himself. He has a surgical shoe. He works in a sitting position therefore is not on his feet all that much. There is undermining 05/01/2019 upon evaluation today patient appears to be doing a little better in regard to the size of his wound though he still has some depth. This does seem to be much cleaner has been using Santyl on the base of the wound followed by packing with silver alginate and behind. Nonetheless I do believe that based on what we are seeing we may need to switch to a collagen type dressing endoform may actually be excellent for him. 05/08/2019 upon evaluation today patient's wound actually appears to be showing better granulation tissue in the base of the wound. With that being said he does have some epiboly noted around the edges of the wound especially plantar and more distal. Subsequently this is can require sharp debridement today. Fortunately there is no signs of active  systemic infection though I do feel like there is some local cellulitis noted at this point. The patient also notes has had increased drainage. 5/13; wound volume quite a bit improved this week down to 3 mm. Patient states pain and drainage better. Culture from last week grew staph aureus [not MRSA] and this is sensitive to  quinolones and he is on Levaquin. HOWEVER he also grew abundant Enterococcus faecalis treatment of choice for this is ampicillin. Quinolone coverage is unreliable 5/20 completing the Augmentin. Small wound on the right lateral foot. Thick rolled edges around the wound. Using silver alginate 05/29/2019 upon evaluation today patient appears to be doing better compared to last time I saw him. Fortunately there is no signs of active infection and I do feel like he is actually making good progress. Overall the quality of the wound bed along with the size looks improved and the inflammation/erythema that was previously noted by myself when I saw the couple weeks back actually has also resolved and this is great. Overall very pleased with how things stand. 06/06/2019 upon evaluation today patient actually appears to be doing quite well with regard to his foot ulcer with regard to some of the granulation were seen at this point. There does not appear to be any evidence of systemic or local infection although he still has discomfort I feel like things are moving in a slow but surely correct direction. I think we may need to help fill some of the space however even though he is using the collagen we may need to have something behind this such as a plain packing strip to try to help hold the collagen in the base of the wound. 06/12/2019 upon evaluation today patient appears to be doing well with regard to his foot ulcer. This seems to be making some slow but steady progress. The wound does not appear to be as deep today compared to where it was previous. Fortunately there is no signs of active  infection at [name], [name]. ([id number]) this time. No fevers, chills, nausea, vomiting, or diarrhea. 06/19/2019 on evaluation today patient's wound does not appear to be doing quite as well as what I would like to see. Fortunately there is no signs of systemic infection at this time which is great news. With that being said I do believe that the patient needs to have a wound culture as I do believe he likely has a local infection that needs to be addressed. He is also can require some debridement today as well. 06/26/2019 upon evaluation today patient's wound actually appears to be showing signs of excellent improvement today. There does not appear to be any evidence of active infection which is great news. There does not appear to be significant erythema at this point. The patient does have improvement overall in the appearance of the infection he has been taking the Augmentin the culture came back showing no organism was grown at this point but nonetheless I do believe he has been doing well with the antibiotics I would continue that at this point. 07/03/2019 upon evaluation today patient appears to be doing about the same in regard to his wound. There does not appear to be any evidence of active infection and overall I am pleased with where things stand. The patient is tolerating the dressing changes without complication but it does not appear that they are packing the wound at this time which I think is going to slow down his healing prospects. 07/10/2019 upon evaluation today patient appears to be doing little better in my opinion in regard to the overall moisture collected in the base of the wound. They have been packing this with a little bit of silver alginate and that has done quite well. Overall I am pleased in that regard. Subsequently the redness does not appear to be any worse  today I did actually go ahead and marked this since this can be 2 weeks between now and when I see him again I  would make sure that if anything changes or worsens he will be able to notice this and go ahead and initiate treatment with the Augmentin. 07/22/2019 patient comes in today a little bit early for evaluation secondary to the fact that he was having issues with increased drainage and concern about infection. With that being said he does appear to have developed a blister area again proximal to where the original wound opening was in although the wound bed itself actually appears to be doing somewhat better he has had increased drainage and tracking of fluid here due to this blister. Nonetheless I believe that that does need to be cleaned away today but again I am not really certain that I see a lot of evidence for infection at this point. 07/29/2019 on evaluation today patient actually appears to be doing better in regard to his foot ulcer. There is improvement compared to last week which is great news. Nonetheless he does come prepared to apply the total contact cast today which I think would be beneficial for him. He knows he is not supposed to drive he did bring his son with him today in order to drive him. 07/31/2019 upon evaluation today patient appears to be doing well with regard to his wound today. I do not see any signs of infection I do believe that he is definitely showing some signs of improvement here in the office in a couple days as far as the overall erythema and pressure getting to the wound area is concerned. With that being said I do believe that the cast has been of great benefit for him. 08/08/2019 upon evaluation today patient appears to be doing better in regard to his wound. Overall I feel like he is showing signs of improvement. The external measurement is really not bad at all although I did remove some of the callus in order to open up the wound itself the depth is actually coming quite well and I am extremely pleased in that regard I do not think we can to see much improvement  externally until we get the internal depth filled in and that again is improved. Fortunately there is no signs of active infection at this time. No fevers, chills, nausea, vomiting, or diarrhea. 08/15/2019 on evaluation today patient actually appears to be doing well with regard to his foot ulcer. This is showing some signs of minimal improvement from the standpoint of the depth and appearance of the wound as well as the edges of the wound. It is taking its time and to be honest I do believe that he seems to be progressing well. I am happy with the cast and what it is achieving at this point. Have contemplated checking into Dermagraft for Apligraf for the patient but again he does seem to be making fairly good progress in my opinion. If it has not tremendously changed come next week I may consider one of the 2. 08/22/2019 on evaluation today patient appears to be making good progress with regard to his wound I am still very pleased with where things stand today. Overall I think that he is making good improvements week by week and I believe that we are getting continue with the Total contact cast I will likely switch to a silver alginate dressing. 08/29/2019 on evaluation today patient appears to be doing quite well with regard  to his wound. Fortunately this is measuring quite small. There is no signs of active infection at this time although he did have a little bit of a callus area just to the side of his foot where we previously noted some fluid collecting underneath that was the case today as well. I did have to remove this. With that being said overall he seems to be doing quite well. 09/05/19 on evaluation today patient appears to be doing very well with regard to his original wound which is actually very small on evaluation today. With that being said he does have a blistered area that's more proximal to where this wound is that has been a little bit more concerned. Fortunately there is no signs of  active infection at this time. I'm going to need to remove the blister tissue today. 10 09/12/2019 on evaluation today patient actually appears to be doing much better in regard to his wound. I think he is actually improved more without the cast that he was prior with the cast. Overall very pleased with where things stand today. 09/22/2019 upon evaluation today patient appears to be doing very well at this time in regard to his foot ulcer. In fact I feel like he is continue to make good progress. I see no signs right now that there is any active infection which is great news. Also see very little evidence of pressure he does have some callus buildup but again this seems to be minimal. 09/29/2019 upon evaluation today patient appears to be doing well with regard to his foot ulcer. He has been tolerating the dressing changes without complication. Fortunately there is no signs of active infection at this time. No fevers, chills, nausea, vomiting, or diarrhea. 10/28/2019 upon evaluation today patient appears to be doing a little bit more poorly than last time I saw him as last time we assumed he was completely healed. Apparently that was not the case or else this is reopened but I tend to believe that this probably has never completely closed internally though it appeared to be. He has been using the offloading felt which seems to have done well there is no signs of redness no evidence of erythema all of which is good news. Nonetheless I am concerned about the fact that he is having issues here with what appears to be a reopening of the wound. 11/04/2019 upon evaluation today patient's wound actually showed signs of fairly good improvement today which is great news the wound bed does not appear to be significantly callus covered and also does not appear to show any signs of significant infection or really any infection whatsoever. I am very pleased in this regard. In general I think that he is making good  progress although this is a lot slower than he would like to see I think we are headed back in the correct direction. He is continuing to use the offloading felt. [name], SCHIMMING ([id number]) 11/18/2019 on evaluation today patient appears to be having some callus buildup and cracking that occurred at the wound site. Subsequently actually did have to perform a fairly significant debridement on the area to clean away some of the callus buildup he tolerated that today with some bleeding with that being said I do feel like the wound appear to be doing much better post debridement. Objective Constitutional Well-nourished and well-hydrated in no acute distress. Vitals Time Taken: 8:34 AM, Height: 74 in, Weight: 308 lbs, BMI: 39.5, Temperature: 98.5 F, Pulse: 90 bpm, Respiratory  Rate: 16 breaths/min, Blood Pressure: 160/78 mmHg. Respiratory normal breathing without difficulty. Psychiatric this patient is able to make decisions and demonstrates good insight into disease process. Alert and Oriented x 3. pleasant and cooperative. General Notes: Again patient's wound actually does show signs of callus around the edges of the wound which required sharp debridement today. I performed this debridement without complication other than some bleeding right in the central portion of the wound which I did have to use chemical cauterization with silver nitrate. He tolerated that today without complication post debridement the wound bed appears to be doing much better which is great news. Integumentary (Hair, Skin) Wound #4R status is Open. Original cause of wound was Gradually Appeared. The wound is located on the Right,Lateral Foot. The wound measures 0.5cm length x 1.6cm width x 0.4cm depth; 0.628cm^2 area and 0.251cm^3 volume. There is Fat Layer (Subcutaneous Tissue) exposed. There is no tunneling or undermining noted. There is a small amount of serosanguineous drainage noted. The wound margin is flat  and intact. There is medium (34-66%) red granulation within the wound bed. There is a small (1-33%) amount of necrotic tissue within the wound bed including Adherent Slough. Assessment Active Problems ICD-10 Type 2 diabetes mellitus with foot ulcer 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d Cellulitis of right lower limb Corns and callosities Essential (primary) hypertension Acquired absence of other left toe(s) Procedures Wound #4R Pre-procedure diagnosis of Wound #4R is a Diabetic Wound/Ulcer of the Lower Extremity located on the Right,Lateral Foot .Severity of Tissue Pre Debridement is: Fat layer exposed. There was a Excisional Skin/Subcutaneous Tissue Debridement with a total area of 0.8 sq cm performed by Tommie Sams., PA-C. With the following instrument(s): Curette to remove Viable and Non-Viable tissue/material. Material removed includes Callus, Subcutaneous Tissue, Slough, and Skin: Dermis. No specimens were taken. A time out was conducted at 09:00, prior to the start of the procedure. A Moderate amount of bleeding was controlled with Pressure. The procedure was tolerated well. Post Debridement Measurements: 0.6cm length x 1.2cm width x 0.4cm depth; 0.226cm^3 volume. Character of Wound/Ulcer Post Debridement is stable. Severity of Tissue Post Debridement is: Fat layer exposed. Post procedure Diagnosis Wound #4R: Same as Pre-Procedure KLINTON, CANDELAS ([id number]) Plan Wound Cleansing: Wound #4R Right,Lateral Foot: Clean wound with Normal Saline. - in office Dial antibacterial soap, wash wounds, rinse and pat dry prior to dressing wounds Anesthetic (add to Medication List): Wound #4R Right,Lateral Foot: Topical Lidocaine 4% cream applied to wound bed prior to debridement (In Clinic Only). Primary Wound Dressing: Wound #4R Right,Lateral Foot: Gentamicin Sulfate Cream Silver Alginate - Thin layer of gentamicin under the  alginate Secondary Dressing: Wound #4R Right,Lateral Foot: Conform/Kerlix - Offloading felt donut, dry gauze, roll gauze to cover Dressing Change Frequency: Wound #4R Right,Lateral Foot: Change Dressing Monday, Wednesday, Friday Follow-up Appointments: Wound #4R Right,Lateral Foot: Return Appointment in 1 week. [place] suggest currently that we continue with the silver alginate for the time being. I am also can recommend he go back to the offloading shoe. 2. I would also recommend that he continue with trying to keep off of this is much as possible. Obviously I think he still is getting some pressure and friction to the area which is why continues to build up callus. 3. I am also not taking off the table the possibility of seeing him back into a total contact cast but were not quite at that point currently. Again when  he had the cast on before I felt like it actually caused some issues and even some pressure to the area just because of the location it was somewhat unusual. Nonetheless we could try this again and being more conscientious about padding that region in particular we will see how things go. We will see patient back for reevaluation in 1 week here in the clinic. If anything worsens or changes patient will contact our office for additional recommendations. Electronic Signature(s) Signed: 11/18/2019 5:53:55 PM By: [name] PA-C Entered By: [name] on 11/18/2019 17:53:55 [name] ([id number]) -------------------------------------------------------------------------------- SuperBill Details Patient Name: [name] Date of Service: 11/18/2019 Medical Record Number: [id number] Patient Account Number: [id number] Date of Birth/Sex: [birth year] (55 y.o. M) Treating RN: Cornell Barman Primary Care Provider: Berneta Sages Other Clinician: Referring Provider: Berneta Sages Treating Provider/Extender: Skipper Cliche in Treatment: 35 Diagnosis  Coding ICD-10 Codes Code Description E11.621 Type 2 diabetes mellitus with foot ulcer L97.512 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d L03.115 Cellulitis of right lower limb L84 Corns and callosities I10 Essential (primary) hypertension Z89.422 Acquired absence of other left toe(s) Facility Procedures CPT4 Code: 17356701 Description: 41030 - DEB SUBQ TISSUE 20 SQ CM/< Modifier: Quantity: 1 CPT4 Code: Description: ICD-10 Diagnosis Description L97.512 Non-pressure chronic ulcer of other part of right foot with fat layer ex Modifier: posed Quantity: Physician Procedures CPT4 Code: 1314388 Description: 11042 - WC PHYS SUBQ TISS 20 SQ CM Modifier: Quantity: 1 CPT4 Code: Description: ICD-10 Diagnosis Description L97.512 Non-pressure chronic ulcer of other part of right foot with fat layer ex Modifier: posed Quantity: Electronic Signature(s) Signed: 11/18/2019 5:54:08 PM By: [name] PA-C Entered By: [name] on 11/18/2019 17:54:06

## 2019-11-20 NOTE — Progress Notes (Signed)
[name], [name] ([id number]) Visit Report for 11/18/2019 Arrival Information Details Patient Name: [name], [name]. Date of Service: 11/18/2019 8:15 AM Medical Record Number: [id number] Patient Account Number: [id number] Date of Birth/Sex: 30-Jan-1965 (54 y.o. M) Treating RN: [name] Primary Care [name]: [name] Other Clinician: Referring [name]: [name] Treating [name]/Extender: [name] in Treatment: 35 Visit Information History Since Last Visit Pain Present Now: No Patient Arrived: Ambulatory Arrival Time: 08:33 Accompanied By: self Transfer Assistance: None Patient Requires Transmission-Based Precautions: No Patient Has Alerts: Yes Patient Alerts: DMII Electronic Signature(s) Signed: 11/18/2019 11:45:22 AM By: [name], [name] Entered By: [name], [name] on 11/18/2019 08:34:01 [name] ([id number]) -------------------------------------------------------------------------------- Encounter Discharge Information Details Patient Name: [name] Date of Service: 11/18/2019 8:15 AM Medical Record Number: [id number] Patient Account Number: [id number] Date of Birth/Sex: Jun 19, 1965 (54 y.o. M) Treating RN: [name] Primary Care Jihaad Bruschi: [name] Other Clinician: Referring [name]: [name] Treating [name]/Extender: [name] in Treatment: 56 Encounter Discharge Information Items Post Procedure Vitals Discharge Condition: Stable Temperature (F): 98.5 Ambulatory Status: Ambulatory Pulse (bpm): 90 Discharge Destination: Home Respiratory Rate (breaths/min): 16 Transportation: Private Auto Blood Pressure (mmHg): 160/78 Schedule Follow-up Appointment: Yes Clinical Summary of Care: Electronic Signature(s) Signed: 11/20/2019 5:12:38 PM By: [name], BSN, RN, CWS, Kim RN, BSN Entered By: [name], BSN, RN, CWS, [name] on 11/18/2019 09:14:51 [name]  ([id number]) -------------------------------------------------------------------------------- Lower Extremity Assessment Details Patient Name: [name], [name]. Date of Service: 11/18/2019 8:15 AM Medical Record Number: [id number] Patient Account Number: [id number] Date of Birth/Sex: 1965/03/09 (55 y.o. M) Treating RN: [name] Primary Care Ivalee Strauser: [name] Other Clinician: Referring [name]: [name] Treating [name]/Extender: [name] in Treatment: 35 Vascular Assessment Pulses: Dorsalis Pedis Palpable: [Right:Yes] Electronic Signature(s) Signed: 11/20/2019 5:12:38 PM By: [name], BSN, RN, CWS, Kim RN, BSN Entered By: [name], BSN, RN, CWS, [name] on 11/18/2019 09:07:08 [name] ([id number]) -------------------------------------------------------------------------------- Multi Wound Chart Details Patient Name: [name] Date of Service: 11/18/2019 8:15 AM Medical Record Number: [id number] Patient Account Number: [id number] Date of Birth/Sex: Dec 30, 1965 (54 y.o. M) Treating RN: [name] Primary Care Merrell Borsuk: [name] Other Clinician: Referring [name]: [name] Treating [name]/Extender: [name] in Treatment: 35 Vital Signs Height(in): 74 Pulse(bpm): 90 Weight(lbs): 308 Blood Pressure(mmHg): 160/78 Body Mass Index(BMI): 40 Temperature(F): 98.5 Respiratory Rate(breaths/min): 16 Photos: [N/A:N/A] Wound Location: Right, Lateral Foot N/A N/A Wounding Event: Gradually Appeared N/A N/A Primary Etiology: Diabetic Wound/Ulcer of the Lower N/A N/A Extremity Comorbid History: Hypertension, Type II Diabetes, N/A N/A Neuropathy Date Acquired: 03/12/2019 N/A N/A Weeks of Treatment: 35 N/A N/A Wound Status: Open N/A N/A Wound Recurrence: Yes N/A N/A Pending Amputation on Yes N/A N/A Presentation: Measurements L x W x D (cm) 0.5x1.6x0.4 N/A N/A Area (cm) : 0.628 N/A N/A Volume (cm) : 0.251 N/A N/A % Reduction in Area:  1.30% N/A N/A % Reduction in Volume: -97.60% N/A N/A Classification: Grade 1 N/A N/A Exudate Amount: Small N/A N/A Exudate Type: Serosanguineous N/A N/A Exudate Color: red, brown N/A N/A Wound Margin: Flat and Intact N/A N/A Granulation Amount: Medium (34-66%) N/A N/A Granulation Quality: Red N/A N/A Necrotic Amount: Small (1-33%) N/A N/A Exposed Structures: Fat Layer (Subcutaneous Tissue): N/A N/A Yes Fascia: No Tendon: No Muscle: No Joint: No Bone: No Epithelialization: Small (1-33%) N/A N/A Treatment Notes Electronic Signature(s) Signed: 11/20/2019 5:12:38 PM By: [name], BSN, RN, CWS, Kim RN, BSN Entered By: [name], BSN, RN, CWS, [name] on 11/18/2019 09:07:36 [name], [name]. (  [name], [name] ([id number]) -------------------------------------------------------------------------------- Multi-Disciplinary Care Plan Details Patient Name: [name], [name]. Date of Service: 11/18/2019 8:15 AM Medical Record Number: [id number] Patient Account Number: [id number] Date of Birth/Sex: [birth year] (54 y.o. M) Treating RN: [name] Primary Care Yalena Colon: [name] Other Clinician: Referring [name]: [name] Treating [name]/Extender: [name] in Treatment: 12 Active Inactive Nutrition Nursing Diagnoses: Impaired glucose control: actual or potential Goals: Patient/caregiver verbalizes understanding of need to maintain therapeutic glucose control per primary care physician Date Initiated: 03/13/2019 Target Resolution Date: 04/11/2019 Goal Status: Active Interventions: Assess patient nutrition upon admission and as needed per policy Notes: Orientation to the Wound Care Program Nursing Diagnoses: Knowledge deficit related to the wound healing center program Goals: Patient/caregiver will verbalize understanding of the [place] Date Initiated: 03/13/2019 Target Resolution Date: 04/11/2019 Goal Status:  Active Interventions: Provide education on orientation to the wound center Notes: Wound/Skin Impairment Nursing Diagnoses: Impaired tissue integrity Goals: Ulcer/skin breakdown will have a volume reduction of 30% by week 4 Date Initiated: 03/13/2019 Target Resolution Date: 04/11/2019 Goal Status: Active Interventions: Assess ulceration(s) every visit Notes: Electronic Signature(s) Signed: 11/20/2019 5:12:38 PM By: [name], BSN, RN, CWS, Kim RN, BSN Entered By: [name], BSN, RN, CWS, [name] on 11/18/2019 09:07:26 [name] ([id number]) -------------------------------------------------------------------------------- Pain Assessment Details Patient Name: [name] Date of Service: 11/18/2019 8:15 AM Medical Record Number: [id number] Patient Account Number: [id number] Date of Birth/Sex: 02-22-1965 (54 y.o. M) Treating RN: [name] Primary Care Caesar Mannella: [name] Other Clinician: Referring Kimora Stankovic: [name] Treating Cevin Rubinstein/Extender: [name] in Treatment: 52 Active Problems Location of Pain Severity and Description of Pain Patient Has Paino No Site Locations Rate the pain. Current Pain Level: 0 Pain Management and Medication Current Pain Management: Electronic Signature(s) Signed: 11/18/2019 11:45:22 AM By: [name], [name] Entered By: [name], [name] on 11/18/2019 08:36:01 [name] ([id number]) -------------------------------------------------------------------------------- Patient/Caregiver Education Details Patient Name: [name] Date of Service: 11/18/2019 8:15 AM Medical Record Number: [id number] Patient Account Number: [id number] Date of Birth/Gender: [birth year] (54 y.o. M) Treating RN: [name] Primary Care Physician: [name] Other Clinician: Referring Physician: Berneta [name] Treating Physician/Extender: [name] in Treatment: 63 Education Assessment Education Provided  To: Patient Education Topics Provided Pressure: Handouts: Pressure Ulcers: Care and Offloading, Other: wear offloading shoe Methods: Demonstration, Explain/Verbal Responses: State content correctly Wound/Skin Impairment: Handouts: Caring for Your Ulcer, Other: continue wound care as prescribed Methods: Demonstration, Explain/Verbal Responses: State content correctly Electronic Signature(s) Signed: 11/20/2019 5:12:38 PM By: [name], BSN, RN, CWS, Kim RN, BSN Entered By: [name], BSN, RN, CWS, [name] on 11/18/2019 09:13:58 [name] ([id number]) -------------------------------------------------------------------------------- Wound Assessment Details Patient Name: [name], [name]. Date of Service: 11/18/2019 8:15 AM Medical Record Number: [id number] Patient Account Number: [id number] Date of Birth/Sex: 11/19/1965 (54 y.o. M) Treating RN: [name] Primary Care Prentice Sackrider: [name] Other Clinician: Referring Akio Hudnall: [name] Treating Joachim Carton/Extender: [name] in Treatment: 35 Wound Status Wound Number: 4R Primary Etiology: Diabetic Wound/Ulcer of the Lower Extremity Wound Location: Right, Lateral Foot Wound Status: Open Wounding Event: Gradually Appeared Comorbid History: Hypertension, Type II Diabetes, Neuropathy Date Acquired: 03/12/2019 Weeks Of Treatment: 35 Clustered Wound: No Pending Amputation On Presentation Photos Photo Uploaded By: [name], [name] on 11/18/2019 08:45:57 Wound Measurements Length: (cm) 0.5 Width: (cm) 1.6 Depth: (cm) 0.4 Area: (cm) 0.628 Volume: (cm) 0.251 % Reduction in Area: 1.3% % Reduction in Volume: -97.6% Epithelialization: Small (1-33%) Tunneling: No Undermining: No Wound Description Classification:  Grade 1 Wound Margin: Flat and Intact Exudate Amount: Small Exudate Type: Serosanguineous Exudate Color: red, brown Foul Odor After Cleansing: No Slough/Fibrino Yes Wound Bed Granulation Amount:  Medium (34-66%) Exposed Structure Granulation Quality: Red Fascia Exposed: No Necrotic Amount: Small (1-33%) Fat Layer (Subcutaneous Tissue) Exposed: Yes Necrotic Quality: Adherent Slough Tendon Exposed: No Muscle Exposed: No Joint Exposed: No Bone Exposed: No Treatment Notes Wound #4R (Right, Lateral Foot) Notes gentamycin, silver alginate, dry gauze, offloading felt, conform [name], [name] ([id number]) Electronic Signature(s) Signed: 11/18/2019 11:45:22 AM By: [name], [name] Entered By: [name], [name] on 11/18/2019 08:44:25 [name] ([id number]) -------------------------------------------------------------------------------- Vitals Details Patient Name: [name] Date of Service: 11/18/2019 8:15 AM Medical Record Number: [id number] Patient Account Number: [id number] Date of Birth/Sex: 03-Mar-1965 (54 y.o. M) Treating RN: [name] Primary Care Elaine Ro[place]: [name] Other Clinician: Referring Rett Stehlik: [name] Treating Jamarques Pinedo/Extender: [name] in Treatment: 35 Vital Signs Time Taken: 08:34 Temperature (F): 98.5 Height (in): 74 Pulse (bpm): 90 Weight (lbs): 308 Respiratory Rate (breaths/min): 16 Body Mass Index (BMI): 39.5 Blood Pressure (mmHg): 160/78 Reference Range: 80 - 120 mg / dl Electronic Signature(s) Signed: 11/18/2019 11:45:22 AM By: [name], [name] Entered By: [name], [name] on 11/18/2019 08:35:53

## 2019-11-25 ENCOUNTER — Other Ambulatory Visit
Admission: RE | Admit: 2019-11-25 | Discharge: 2019-11-25 | Disposition: A | Discharge status: Home / Self Care | Payer: Managed Care, Other (non HMO) | Source: Ambulatory Visit | Attending: Physician Assistant | Admitting: Physician Assistant

## 2019-11-25 ENCOUNTER — Encounter: Payer: Managed Care, Other (non HMO) | Admitting: Physician Assistant

## 2019-11-25 ENCOUNTER — Other Ambulatory Visit: Payer: Self-pay

## 2019-11-25 DIAGNOSIS — E11621 Type 2 diabetes mellitus with foot ulcer: Secondary | ICD-10-CM | POA: Diagnosis not present

## 2019-11-25 DIAGNOSIS — L089 Local infection of the skin and subcutaneous tissue, unspecified: Secondary | ICD-10-CM | POA: Insufficient documentation

## 2019-11-25 NOTE — Progress Notes (Signed)
[name] ([id number]) Visit Report for 11/25/2019 Arrival Information Details Patient Name: [name]. Date of Service: 11/25/2019 8:45 AM Medical Record Number: [id number] Patient Account Number: [id number] Date of Birth/Sex: 27-Apr-1965 (54 y.o. M) Treating RN: Stann Mainland Primary Care Ewart Carrera: Berneta Sages Other Clinician: Referring Jaleyah Longhi: Berneta Sages Treating Satvik Parco/Extender: Skipper Cliche in Treatment: 36 Visit Information History Since Last Visit Added or deleted any medications: No Patient Arrived: Ambulatory Any new allergies or adverse reactions: No Arrival Time: 09:09 Had a fall or experienced change in No Accompanied By: self activities of daily living that may affect Transfer Assistance: None risk of falls: Patient Identification Verified: Yes Signs or symptoms of abuse/neglect since last visito No Secondary Verification Process Completed: Yes Hospitalized since last visit: No Patient Requires Transmission-Based Precautions: No Implantable device outside of the clinic excluding No Patient Has Alerts: Yes cellular tissue based products placed in the center Patient Alerts: DMII since last visit: Has Dressing in Place as Prescribed: Yes Has Footwear/Offloading in Place as Prescribed: Yes Right: Surgical Shoe with Pressure Relief Insole Pain Present Now: No Electronic Signature(s) Signed: 11/25/2019 3:53:56 PM By: Deon Pilling Entered By: Deon Pilling on 11/25/2019 [place], [place]. ([id number]) -------------------------------------------------------------------------------- Encounter Discharge Information Details Patient Name: [name] Date of Service: 11/25/2019 8:45 AM Medical Record Number: [id number] Patient Account Number: [id number] Date of Birth/Sex: March 31, 1965 (54 y.o. M) Treating RN: Carlene Coria Primary Care Graylen Noboa: Berneta Sages Other Clinician: Referring Finleigh Cheong: Berneta Sages Treating  Nailah Luepke/Extender: Skipper Cliche in Treatment: 36 Encounter Discharge Information Items Post Procedure Vitals Discharge Condition: Stable Temperature (F): 98.6 Ambulatory Status: Ambulatory Pulse (bpm): 87 Discharge Destination: Home Respiratory Rate (breaths/min): 18 Transportation: Private Auto Blood Pressure (mmHg): 145/87 Accompanied By: self Schedule Follow-up Appointment: Yes Clinical Summary of Care: Patient Declined Electronic Signature(s) Signed: 11/25/2019 5:17:28 PM By: Carlene Coria RN Entered By: Carlene Coria on 11/25/2019 09:47:39 [name] ([id number]) -------------------------------------------------------------------------------- Lower Extremity Assessment Details Patient Name: [name] Date of Service: 11/25/2019 8:45 AM Medical Record Number: [id number] Patient Account Number: [id number] Date of Birth/Sex: 1965/03/02 (54 y.o. M) Treating RN: Stann Mainland Primary Care Matisyn Cabeza: Berneta Sages Other Clinician: Referring Mihir Flanigan: Berneta Sages Treating Purvi Ruehl/Extender: Skipper Cliche in Treatment: 36 Edema Assessment Assessed: [Left: No] [Right: Yes] Edema: [Left: N] [Right: o] Calf Left: Right: Point of Measurement: From Medial Instep 40 cm Ankle Left: Right: Point of Measurement: From Medial Instep 24 cm Vascular Assessment Pulses: Dorsalis Pedis Palpable: [Right:Yes] Electronic Signature(s) Signed: 11/25/2019 3:53:56 PM By: Deon Pilling Entered By: Deon Pilling on 11/25/2019 09:17:52 [name] ([id number]) -------------------------------------------------------------------------------- Multi Wound Chart Details Patient Name: [name] Date of Service: 11/25/2019 8:45 AM Medical Record Number: [id number] Patient Account Number: [id number] Date of Birth/Sex: Nov 18, 1965 (54 y.o. M) Treating RN: Cornell Barman Primary Care Maxime Beckner: Berneta Sages Other Clinician: Referring Herschel Fleagle: Berneta Sages Treating  Deosha Werden/Extender: Skipper Cliche in Treatment: 36 Vital Signs Height(in): 74 Capillary Blood Glucose 116 (mg/dl): Weight(lbs): 308 Pulse(bpm): 87 Body Mass Index(BMI): 40 Blood Pressure(mmHg): 145/87 Temperature(F): 98.6 Respiratory Rate(breaths/min): 18 Photos: [N/A:N/A] Wound Location: Right, Lateral Foot N/A N/A Wounding Event: Gradually Appeared N/A N/A Primary Etiology: Diabetic Wound/Ulcer of the Lower N/A N/A Extremity Comorbid History: Hypertension, Type II Diabetes, N/A N/A Neuropathy Date Acquired: 03/12/2019 N/A N/A Weeks of Treatment: 36 N/A N/A Wound Status: Open N/A N/A Wound Recurrence: Yes N/A N/A Pending Amputation on Yes N/A N/A Presentation: Measurements L x W x D (cm) 0.6x0.8x0.4 N/A N/A Area (cm) :  0.377 N/A N/A Volume (cm) : 0.151 N/A N/A % Reduction in Area: 40.70% N/A N/A % Reduction in Volume: -18.90% N/A N/A Classification: Grade 1 N/A N/A Exudate Amount: Small N/A N/A Exudate Type: Serosanguineous N/A N/A Exudate Color: red, brown N/A N/A Wound Margin: Flat and Intact N/A N/A Granulation Amount: Large (67-100%) N/A N/A Granulation Quality: Red N/A N/A Necrotic Amount: Small (1-33%) N/A N/A Exposed Structures: Fat Layer (Subcutaneous Tissue): N/A N/A Yes Fascia: No Tendon: No Muscle: No Joint: No Bone: No Epithelialization: Medium (34-66%) N/A N/A Treatment Notes Electronic Signature(s) Signed: 11/25/2019 5:19:05 PM By: Gretta Cool, BSN, RN, CWS, Kim RN, BSN Entered By: Gretta Cool, BSN, RN, CWS, Kim on 11/25/2019 09:29:47 [name] ([name] ([id number]) -------------------------------------------------------------------------------- [place] Details Patient Name: [name]. Date of Service: 11/25/2019 8:45 AM Medical Record Number: [id number] Patient Account Number: [id number] Date of Birth/Sex: February 06, 1965 (54 y.o. M) Treating RN: Cornell Barman Primary Care Anurag Scarfo: Berneta Sages Other Clinician: Referring Krizia Flight: Berneta Sages Treating Joclyn Alsobrook/Extender: Skipper Cliche in Treatment: 36 Active Inactive Necrotic Tissue Nursing Diagnoses: Impaired tissue integrity related to necrotic/devitalized tissue Knowledge deficit related to management of necrotic/devitalized tissue Goals: Necrotic/devitalized tissue will be minimized in the wound bed Date Initiated: 11/25/2019 Target Resolution Date: 12/11/2019 Goal Status: Active Patient/caregiver will verbalize understanding of reason and process for debridement of necrotic tissue Date Initiated: 11/25/2019 Target Resolution Date: 12/11/2019 Goal Status: Active Interventions: Assess patient pain level pre-, during and post procedure and prior to discharge Treatment Activities: Excisional debridement : 11/25/2019 Notes: Nutrition Nursing Diagnoses: Impaired glucose control: actual or potential Goals: Patient/caregiver verbalizes understanding of need to maintain therapeutic glucose control per primary care physician Date Initiated: 03/13/2019 Target Resolution Date: 04/11/2019 Goal Status: Active Interventions: Assess patient nutrition upon admission and as needed per policy Notes: Orientation to the Wound Care Program Nursing Diagnoses: Knowledge deficit related to the wound healing center program Goals: Patient/caregiver will verbalize understanding of the [place] Program Date Initiated: 03/13/2019 Target Resolution Date: 04/11/2019 Goal Status: Active Interventions: Provide education on orientation to the wound center Notes: Wound/Skin Impairment Nursing Diagnoses: CRAIGORY, TOSTE ([id number]) Impaired tissue integrity Goals: Ulcer/skin breakdown will have a volume reduction of 30% by week 4 Date Initiated: 03/13/2019 Target Resolution Date: 04/11/2019 Goal Status: Active Interventions: Assess ulceration(s) every visit Notes: Electronic Signature(s) Signed: 11/25/2019 5:19:05 PM  By: Gretta Cool, BSN, RN, CWS, Kim RN, BSN Entered By: Gretta Cool, BSN, RN, CWS, Kim on 11/25/2019 09:28:58 [name] ([id number]) -------------------------------------------------------------------------------- Pain Assessment Details Patient Name: [name] Date of Service: 11/25/2019 8:45 AM Medical Record Number: [id number] Patient Account Number: [id number] Date of Birth/Sex: [birth year] (54 y.o. M) Treating RN: Stann Mainland Primary Care Karn Derk: Berneta Sages Other Clinician: Referring Ahlam Piscitelli: Berneta Sages Treating Meilah Delrosario/Extender: Skipper Cliche in Treatment: 36 Active Problems Location of Pain Severity and Description of Pain Patient Has Paino No Site Locations Rate the pain. Current Pain Level: 0 Pain Management and Medication Current Pain Management: Medication: No Cold Application: No Rest: No Massage: No Activity: No T.E.N.S.: No Heat Application: No Leg drop or elevation: No Is the Current Pain Management Adequate: Adequate How does your wound impact your activities of daily livingo Sleep: No Bathing: No Appetite: No Relationship With Others: No Bladder Continence: No Emotions: No Bowel Continence: No Work: No Toileting: No Drive: No Dressing: No Hobbies: No Notes Per patient sore when walking. Electronic Signature(s) Signed: 11/25/2019 3:53:56 PM By: Deon Pilling Entered By: Deon Pilling on 11/25/2019 09:17:16  JARRYD, GRATZ ([id number]) -------------------------------------------------------------------------------- Patient/Caregiver Education Details Patient Name: JAKING, THAYER. Date of Service: 11/25/2019 8:45 AM Medical Record Number: [id number] Patient Account Number: [id number] Date of Birth/Gender: 08/09/1965 (54 y.o. M) Treating RN: Cornell Barman Primary Care Physician: Berneta Sages Other Clinician: Referring Physician: Berneta Sages Treating Physician/Extender: Skipper Cliche in Treatment: 4 Education  Assessment Education Provided To: Patient Education Topics Provided Wound/Skin Impairment: Handouts: Caring for Your Ulcer, Other: wound care as prescribed Methods: Demonstration, Explain/Verbal Responses: State content correctly Electronic Signature(s) Signed: 11/25/2019 5:19:05 PM By: Gretta Cool, BSN, RN, CWS, Kim RN, BSN Entered By: Gretta Cool, BSN, RN, CWS, Kim on 11/25/2019 09:36:03 [name] ([id number]) -------------------------------------------------------------------------------- Wound Assessment Details Patient Name: KREG, EARHART. Date of Service: 11/25/2019 8:45 AM Medical Record Number: [id number] Patient Account Number: [id number] Date of Birth/Sex: 06-20-1965 (54 y.o. M) Treating RN: Stann Mainland Primary Care Kamoria Lucien: Berneta Sages Other Clinician: Referring Dempsey Knotek: Berneta Sages Treating Sara Selvidge/Extender: Skipper Cliche in Treatment: 36 Wound Status Wound Number: [id number] Primary Etiology: Diabetic Wound/Ulcer of the Lower Extremity Wound Location: Right, Lateral Foot Wound Status: Open Wounding Event: Gradually Appeared Comorbid History: Hypertension, Type II Diabetes, Neuropathy Date Acquired: 03/12/2019 Weeks Of Treatment: 36 Clustered Wound: No Pending Amputation On Presentation Photos Wound Measurements Length: (cm) 0.6 % Reduc Width: (cm) 0.8 % Reduc Depth: (cm) 0.4 Epithel Area: (cm) 0.377 Tunnel Volume: (cm) 0.151 Underm tion in Area: 40.7% tion in Volume: -18.9% ialization: Medium (34-66%) ing: No ining: No Wound Description Classification: Grade 1 Foul Od Wound Margin: Flat and Intact Slough/ Exudate Amount: Small Exudate Type: Serosanguineous Exudate Color: red, brown or After Cleansing: No Fibrino Yes Wound Bed Granulation Amount: Large (67-100%) Exposed Structure Granulation Quality: Red Fascia Exposed: No Necrotic Amount: Small (1-33%) Fat Layer (Subcutaneous Tissue) Exposed: Yes Necrotic Quality: Adherent Slough Tendon  Exposed: No Muscle Exposed: No Joint Exposed: No Bone Exposed: No Treatment Notes Wound #4R (Right, Lateral Foot) 1. Cleansed with: Clean wound with Normal Saline Notes JAYMAR, LOEBER ([id number]) gentamycin, silver alginate, dry gauze, offloading felt, conform Electronic Signature(s) Signed: 11/25/2019 3:53:56 PM By: Deon Pilling Entered By: Deon Pilling on 11/25/2019 09:22:18 [name] ([id number]) -------------------------------------------------------------------------------- Vitals Details Patient Name: [name] Date of Service: 11/25/2019 8:45 AM Medical Record Number: [id number] Patient Account Number: [id number] Date of Birth/Sex: 1965-08-04 (54 y.o. M) Treating RN: Stann Mainland Primary Care Claire Bridge: Berneta Sages Other Clinician: Referring Jaasia Viglione: Berneta Sages Treating Carrianne Hyun/Extender: Skipper Cliche in Treatment: 36 Vital Signs Time Taken: 09:09 Temperature (F): 98.6 Height (in): 74 Pulse (bpm): 87 Weight (lbs): 308 Respiratory Rate (breaths/min): 18 Body Mass Index (BMI): 39.5 Blood Pressure (mmHg): 145/87 Capillary Blood Glucose (mg/dl): 116 Reference Range: 80 - 120 mg / dl Electronic Signature(s) Signed: 11/25/2019 3:53:56 PM By: Deon Pilling Entered By: Deon Pilling on 11/25/2019 09:16:53

## 2019-11-25 NOTE — Progress Notes (Addendum)
ABDIAS, HICKAM ([id number]) Visit Report for 11/25/2019 Chief Complaint Document Details Patient Name: [name], [name]. Date of Service: 11/25/2019 8:45 AM Medical Record Number: [id number] Patient Account Number: [id number] Date of Birth/Sex: [birth year] (54 y.o. M) Treating RN: Cornell Barman Primary Care Provider: Berneta Sages Other Clinician: Referring Provider: Berneta Sages Treating Provider/Extender: Skipper Cliche in Treatment: 36 Information Obtained from: Patient Chief Complaint Right lateral foot ulcer Electronic Signature(s) Signed: 11/25/2019 9:18:41 AM By: [name] PA-C Entered By: [name] on 11/25/2019 09:18:41 [name] ([id number]) -------------------------------------------------------------------------------- Debridement Details Patient Name: [name] Date of Service: 11/25/2019 8:45 AM Medical Record Number: [id number] Patient Account Number: [id number] Date of Birth/Sex: [birth year] (54 y.o. M) Treating RN: Cornell Barman Primary Care Provider: Berneta Sages Other Clinician: Referring Provider: Berneta Sages Treating Provider/Extender: Skipper Cliche in Treatment: 36 Debridement Performed for Wound #4R Right,Lateral Foot Assessment: Performed By: Physician Tommie Sams., PA-C Debridement Type: Debridement Severity of Tissue Pre Debridement: Fat layer exposed Level of Consciousness (Pre- Awake and Alert procedure): Pre-procedure Verification/Time Out Yes - 09:30 Taken: Start Time: 09:30 Pain Control: Lidocaine Total Area Debrided (L x W): 0.6 (cm) x 0.8 (cm) = 0.48 (cm) Tissue and other material Viable, Non-Viable, Subcutaneous debrided: Level: Skin/Subcutaneous Tissue Debridement Description: Excisional Instrument: Scissors Specimen: Swab, Number of Specimens Taken: 1 Bleeding: Moderate Hemostasis Achieved: Pressure Response to Treatment: Procedure was tolerated well Level of Consciousness (Post- Awake and  Alert procedure): Post Debridement Measurements of Total Wound Length: (cm) 0.7 Width: (cm) 0.9 Depth: (cm) 0.5 Volume: (cm) 0.247 Character of Wound/Ulcer Post Debridement: Stable Severity of Tissue Post Debridement: Fat layer exposed Post Procedure Diagnosis Same as Pre-procedure Electronic Signature(s) Signed: 11/25/2019 5:19:05 PM By: Gretta Cool, BSN, RN, CWS, Kim RN, BSN Signed: 11/25/2019 5:21:29 PM By: [name] PA-C Entered By: Gretta Cool, BSN, RN, CWS, Kim on 11/25/2019 09:31:53 [name] ([id number]) -------------------------------------------------------------------------------- HPI Details Patient Name: [name] Date of Service: 11/25/2019 8:45 AM Medical Record Number: [id number] Patient Account Number: [id number] Date of Birth/Sex: 06-09-1965 (54 y.o. M) Treating RN: Cornell Barman Primary Care Provider: Berneta Sages Other Clinician: Referring Provider: Berneta Sages Treating Provider/Extender: Skipper Cliche in Treatment: 97 History of Present Illness HPI Description: This 54 year old male comes with an ulcerated area on the plantar aspect of the right foot which she's had for approximately a month. I have known him from a previous visit at [facility] wound center and was treated in the months of April and May 2016 and rapidly healed a left plantar ulcer with a total contact cast. He has been a diabetic for about 16 years and tries to keep active and is fairly compliant with his diabetes management. 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 Past medical history significant for hypertension, hyperlipidemia, and status post appendectomy 1993. He does not smoke or drink alcohol. 01/14/2015 -- the patient had tolerated his total contact cast very well and had no problems and has had no systemic symptoms. However when his total contact cast was cut open he had excessive amount of purulent drainage in spite of being on antibiotics. He had had a recent x-ray  done in the ER 12/22/2014 which showed IMPRESSION:No evidence of osseous erosion. Known soft tissue ulceration is not well characterized on radiograph. Scattered vascular calcifications seen. his last hemoglobin A1c in December was 7.3. He has been on Augmentin and doxycycline for the last 2 weeks. 01/21/2015 -- his culture grew rare growth of Pantoea species an MR moderate  growth of Candida parapsilosis. it is sensitive to levofloxacin. He has not heard back from the insurance company regarding his hyperbaric oxygen therapy. His MRI has not been done yet and we will try and get him an earlier date 01/28/2015 -- MRI was done last night -- IMPRESSION:1. Soft tissue ulcer overlying the plantar aspect of the fifth metatarsal head extending to the cortex. Subcortical marrow edema in the fifth metatarsal head with corresponding T1 hypointensity is concerning for early osteomyelitis of the plantar lateral aspect of the fifth metatarsal head. Chest x-ray done on 01/14/2015 shows bronchiectatic changes without infiltrate. EKG done on generally 17 2017 shows a normal sinus rhythm and is a normal EKG. 02/04/2015 -- he was asked to see Dr. [name] last week and had 2 appointments but had to cancel both due to pressures of work. Last night he has woken up with severe pain in the foot and leg and it is swollen up. No fever or no change in his blood glucose. Addendum: I spoke with Dr. [name] who kindly agreed to accept the patient for inpatient therapy and have also opened to the hospitalist Dr. [name], and discuss details of the management including PICC line and repeat cultures. 02/12/2015-- -- was seen by Dr. [name] in the hospital and a PICC line was placed. He was to receive Ceftazidime 2 g every 12 hourly, oral levofloxacin 750 mg every 24 hourly and oral fluconazole 200 mg daily. The antibiotics were to be given for 4 weeks except the Diflucan was to be given for the first 2 weeks. Reviewed note  from 02/10/2015 -- and Dr. [name] had recommended management for growth of MSSA and Serratia. He switched him from ceftazidime to ceftriaxone 2 g every 24 hours. Levofloxacin was stopped and he would continue on fluconazole for another week. He had asked me to decide whether further imaging was necessary and whether surgical debridement of the infected bone was needed. He is doing well and has been off work for this week and we will keep him off the next week. 02/22/2015 -- he was seen by my colleague on 01/19/2015 and at that time an incision and drainage was done on his right lateral forefoot on the dorsum. Today when I probed this wound it is frankly draining pus and it communicates with the ulcer on the plantar aspect of his right foot. The patient is still on IV ceftriaxone 2 g every 24 hours and is to be seen by Dr. [name] on Friday. 03/19/2015 -- On 03/04/2015 I spoke to Dr. 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 He recommended operative debridement which would probably result in the fifth metatarsal head and toe amputation.The patient would be referred back to [place] once he was done with surgery. He was admitted to [facility] yesterday and had surgery done by podiatry for a right fifth metatarsal acute osteomyelitis with cellulitis and abscess. He had a right foot incision and drainage with fifth metatarsal partial amputation and removal of toe infected bone and soft tissue with cultures. The wound was partially closed and packing of the distal end was done. Patient was already on cefepime 2 g IV every 8 hourly and put on vancomycin pending final cultures. He had grown moderate gram-negative rods, and later found to be rare diphtheroids. I received a call  from Dr. Cannon Kettle the podiatrist and we discussed the above. On  03/10/2015 he was found positive for influenza a and has been put on Tamiflu. Since his discharge he has been seen by Dr. Cannon Kettle who is planning to remove his sutures this coming week. He was reviewed by Dr. [name] on 03/17/2015 and his Diflucan was stopped and vancomycin. After the 3 doses he is taking. He is going to change Ceftazidime to Zosyn 3.375 g IV every 8 hours. 03/25/2015 -- he was seen by the podiatrist a couple of days ago and the sutures were removed. She will follow back with him in 4 weeks' time and at that time x-rays will be taken and a custom molded insert would be made for his shoe. 04/01/2015 -- he was seen by Dr. [name] on 03/29/2015 who pulled the PICC line stopped his IV antibiotics and recommended starting doxycycline and levofloxacin for 2 weeks. He also stop the fluconazole. The patient will follow up with him only when necessary. Readmission: 03/15/17 on evaluation today patient presents for initial evaluation concerning the new issues although he has previously been evaluated in our clinic. Unfortunately the previous evaluation led to the patient having to proceed to amputation and so he is somewhat nervous about being here [name], [name] ([id number]) today. With that being said he has a very slight blister that occurred on the plantar aspect more medial on the right great toe that has been present for just a very short amount of time, several days. With that being said he felt like initially when he called that he somewhat overreacted but due to the fact that his previous issue led to amputation he is very cautious these days I explained that he did the right thing. With that being said he has been tolerating the dressing changes without complication mainly he just been covering this. He does not have any discomfort in secondary to neuropathy it's unlikely that he feels much. With that being said he does tell me that the issue that he had here is that he  went barefoot when he knows he should not have which subsequently led to the blisters. He states is definitely not doing that anymore. His most recent hemoglobin A1c was December and registered 6.9 his ABI today was 1.1. 03/27/17 on evaluation today patient appears to be doing great in regard to his right great toe ulcer. He has been tolerating the dressing changes without complication. The good news is this is making excellent progress and he seems to be caring for this in an excellent fashion. I see no evidence of breakdown that would make me concerned that he was at risk for infection/amputation. This has obviously been his concern due to the fifth toe amputation that was necessitated previous when he had a similar issue. Nonetheless this seems to be progressing much more nicely. Readmission: 03/13/2019 upon evaluation today patient presents for reevaluation here in the clinic concerning issues he has been having with his right foot on the lateral portion at the proximal end of the metatarsal. Subsequently he tells me that this just opened up in the past couple of days or so and the erythema really began about 24 to 48 hours ago. Fortunately there is no signs of systemic infection. 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 That left him with this bony prominence that is where the wound is currently. There is erythema surrounding that does have me concerned about cellulitis.  With that being said he was noncompressible as far as ABIs are concerned I do think we can need to check into arterial studies at this point. Patient does have a history of hypertension along with diabetes mellitus type 2. He also tends to develop quite a bit of callus white often. 03/20/2019 upon evaluation today patient appears to be doing very well in regard to his wounds currently. He has been tolerating the dressing changes without complication. Fortunately there is no signs of  infection. His ABIs were good at this point and registered at 1.07 on the left and 1.09 on the right. In regard to the x-ray this was negative as well for any signs of osteomyelitis. The patient also seems to be doing better in regard to the infection. He still has several days of the antibiotic left at this point best the Bactrim DS and he seems to be doing very well with this. Overall I am extremely pleased with the progress in 1 week's time he is going require some sharp debridement today however. 03/27/2019 upon evaluation today patient actually appears to be making some progress in regard to his wound. It is a little deeper but measuring smaller which is good news due to the fact that again were clear away some of the necrotic tissue which is why the depth is increasing a little bit. Nonetheless after like were getting very close to being down at a good wound bed. Fortunately there is no signs of active infection at this time. There is a little bit of erythema immediately surrounding the wound that we do want to be very cognizant of and careful about. For that reason I am going to go ahead and extend his antibiotic today for an additional 10 days that is the Bactrim. 04/03/2019 upon evaluation today patient appears to be making some progress. I do feel like the Annitta Needs is doing a good job along with the alginate is being packed into the wound space behind. He has been tolerating the dressing changes without complication. Fortunately there is no signs of active infection at this time. No fevers, chills, nausea, vomiting, or diarrhea. 04/10/2019 upon evaluation today patient appears to be making progress. The wound is not quite as deep but he still does not have a great wound surface yet for working on this and the Santyl does seem to be cleaning things up. Fortunately there is no evidence of active infection at this time. No fevers, chills, nausea, vomiting, or diarrhea. 04/17/2019 upon evaluation today  patient appears to be doing excellent at this time in regard to his foot ulcer. I do believe the Santyl with the alginate packed in behind has been of benefit for him and I am very pleased in this regard. With that being said I am feeling like the wound surface is improving quite a bit each time I see him still I do not believe we are at the point of stating that the wound bed is perfect but again were making progress. The patient is going require some sharp debridement today. 4/22; this is a patient who is using Santyl with calcium alginate. Apparently his wound dimensions have been improving. Patient is changing the dressing himself. He has a surgical shoe. He works in a sitting position therefore is not on his feet all that much. There is undermining 05/01/2019 upon evaluation today patient appears to be doing a little better in regard to the size of his wound though he still has some depth. This does  seem to be much cleaner has been using Santyl on the base of the wound followed by packing with silver alginate and behind. Nonetheless I do believe that based on what we are seeing we may need to switch to a collagen type dressing endoform may actually be excellent for him. 05/08/2019 upon evaluation today patient's wound actually appears to be showing better granulation tissue in the base of the wound. With that being said he does have some epiboly noted around the edges of the wound especially plantar and more distal. Subsequently this is can require sharp debridement today. Fortunately there is no signs of active systemic infection though I do feel like there is some local cellulitis noted at this point. The patient also notes has had increased drainage. 5/13; wound volume quite a bit improved this week down to 3 mm. Patient states pain and drainage better. Culture from last week grew staph aureus [not MRSA] and this is sensitive to quinolones and he is on Levaquin. HOWEVER he also grew abundant  Enterococcus faecalis treatment of choice for this is ampicillin. Quinolone coverage is unreliable 5/20 completing the Augmentin. Small wound on the right lateral foot. Thick rolled edges around the wound. Using silver alginate 05/29/2019 upon evaluation today patient appears to be doing better compared to last time I saw him. Fortunately there is no signs of active infection and I do feel like he is actually making good progress. Overall the quality of the wound bed along with the size looks improved and the inflammation/erythema that was previously noted by myself when I saw the couple weeks back actually has also resolved and this is great. Overall very pleased with how things stand. 06/06/2019 upon evaluation today patient actually appears to be doing quite well with regard to his foot ulcer with regard to some of the granulation were seen at this point. There does not appear to be any evidence of systemic or local infection although he still has discomfort I feel like things are moving in a slow but surely correct direction. I think we may need to help fill some of the space however even though he is using the collagen we may need to have something behind this such as a plain packing strip to try to help hold the collagen in the base of the wound. 06/12/2019 upon evaluation today patient appears to be doing well with regard to his foot ulcer. This seems to be making some slow but steady progress. The wound does not appear to be as deep today compared to where it was previous. Fortunately there is no signs of active infection at this time. No fevers, chills, nausea, vomiting, or diarrhea. 06/19/2019 on evaluation today patient's wound does not appear to be doing quite as well as what I would like to see. Fortunately there is no signs of systemic infection at this time which is great news. With that being said I do believe that the patient needs to have a wound culture as I do [name], [name].  ([id number]) believe he likely has a local infection that needs to be addressed. He is also can require some debridement today as well. 06/26/2019 upon evaluation today patient's wound actually appears to be showing signs of excellent improvement today. There does not appear to be any evidence of active infection which is great news. There does not appear to be significant erythema at this point. The patient does have improvement overall in the appearance of the infection he has been taking the Augmentin  the culture came back showing no organism was grown at this point but nonetheless I do believe he has been doing well with the antibiotics I would continue that at this point. 07/03/2019 upon evaluation today patient appears to be doing about the same in regard to his wound. There does not appear to be any evidence of active infection and overall I am pleased with where things stand. The patient is tolerating the dressing changes without complication but it does not appear that they are packing the wound at this time which I think is going to slow down his healing prospects. 07/10/2019 upon evaluation today patient appears to be doing little better in my opinion in regard to the overall moisture collected in the base of the wound. They have been packing this with a little bit of silver alginate and that has done quite well. Overall I am pleased in that regard. Subsequently the redness does not appear to be any worse today I did actually go ahead and marked this since this can be 2 weeks between now and when I see him again I would make sure that if anything changes or worsens he will be able to notice this and go ahead and initiate treatment with the Augmentin. 07/22/2019 patient comes in today a little bit early for evaluation secondary to the fact that he was having issues with increased drainage and concern about infection. With that being said he does appear to have developed a blister area again  proximal to where the original wound opening was in although the wound bed itself actually appears to be doing somewhat better he has had increased drainage and tracking of fluid here due to this blister. Nonetheless I believe that that does need to be cleaned away today but again I am not really certain that I see a lot of evidence for infection at this point. 07/29/2019 on evaluation today patient actually appears to be doing better in regard to his foot ulcer. There is improvement compared to last week which is great news. Nonetheless he does come prepared to apply the total contact cast today which I think would be beneficial for him. He knows he is not supposed to drive he did bring his son with him today in order to drive him. 07/31/2019 upon evaluation today patient appears to be doing well with regard to his wound today. I do not see any signs of infection I do believe that he is definitely showing some signs of improvement here in the office in a couple days as far as the overall erythema and pressure getting to the wound area is concerned. With that being said I do believe that the cast has been of great benefit for him. 08/08/2019 upon evaluation today patient appears to be doing better in regard to his wound. Overall I feel like he is showing signs of improvement. The external measurement is really not bad at all although I did remove some of the callus in order to open up the wound itself the depth is actually coming quite well and I am extremely pleased in that regard I do not think we can to see much improvement externally until we get the internal depth filled in and that again is improved. Fortunately there is no signs of active infection at this time. No fevers, chills, nausea, vomiting, or diarrhea. 08/15/2019 on evaluation today patient actually appears to be doing well with regard to his foot ulcer. This is showing some signs of minimal improvement from  the standpoint of the depth and  appearance of the wound as well as the edges of the wound. It is taking its time and to be honest I do believe that he seems to be progressing well. I am happy with the cast and what it is achieving at this point. Have contemplated checking into Dermagraft for Apligraf for the patient but again he does seem to be making fairly good progress in my opinion. If it has not tremendously changed come next week I may consider one of the 2. 08/22/2019 on evaluation today patient appears to be making good progress with regard to his wound I am still very pleased with where things stand today. Overall I think that he is making good improvements week by week and I believe that we are getting continue with the Total contact cast I will likely switch to a silver alginate dressing. 08/29/2019 on evaluation today patient appears to be doing quite well with regard to his wound. Fortunately this is measuring quite small. There is no signs of active infection at this time although he did have a little bit of a callus area just to the side of his foot where we previously noted some fluid collecting underneath that was the case today as well. I did have to remove this. With that being said overall he seems to be doing quite well. 09/05/19 on evaluation today patient appears to be doing very well with regard to his original wound which is actually very small on evaluation today. With that being said he does have a blistered area that's more proximal to where this wound is that has been a little bit more concerned. Fortunately there is no signs of active infection at this time. I'm going to need to remove the blister tissue today. 10 09/12/2019 on evaluation today patient actually appears to be doing much better in regard to his wound. I think he is actually improved more without the cast that he was prior with the cast. Overall very pleased with where things stand today. 09/22/2019 upon evaluation today patient appears to be  doing very well at this time in regard to his foot ulcer. In fact I feel like he is continue to make good progress. I see no signs right now that there is any active infection which is great news. Also see very little evidence of pressure he does have some callus buildup but again this seems to be minimal. 09/29/2019 upon evaluation today patient appears to be doing well with regard to his foot ulcer. He has been tolerating the dressing changes without complication. Fortunately there is no signs of active infection at this time. No fevers, chills, nausea, vomiting, or diarrhea. 10/28/2019 upon evaluation today patient appears to be doing a little bit more poorly than last time I saw him as last time we assumed he was completely healed. Apparently that was not the case or else this is reopened but I tend to believe that this probably has never completely closed internally though it appeared to be. He has been using the offloading felt which seems to have done well there is no signs of redness no evidence of erythema all of which is good news. Nonetheless I am concerned about the fact that he is having issues here with what appears to be a reopening of the wound. 11/04/2019 upon evaluation today patient's wound actually showed signs of fairly good improvement today which is great news the wound bed does not appear to be significantly callus  covered and also does not appear to show any signs of significant infection or really any infection whatsoever. I am very pleased in this regard. In general I think that he is making good progress although this is a lot slower than he would like to see I think we are headed back in the correct direction. He is continuing to use the offloading felt. 11/18/2019 on evaluation today patient appears to be having some callus buildup and cracking that occurred at the wound site. Subsequently actually did have to perform a fairly significant debridement on the area to clean  away some of the callus buildup he tolerated that today with some bleeding with that being said I do feel like the wound appear to be doing much better post debridement. [name], [name] ([id number]) 11/25/2019 upon evaluation today patient appears to be doing somewhat poorly in regard to his foot ulcer. Unfortunately he is having some issues with cellulitis along the lateral aspect of his foot. Unfortunately he seems to be having some issues today with infection/cellulitis in regard to the foot. It still towards the proximal/dorsal surface of where the wound is and this subsequently is the area that always seems to cause him trouble. 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 I do believe we may need to look into this further to ensure that there is nothing more significant going on considering that he seems to have an ongoing issue with this at this point. Electronic Signature(s) Signed: 11/25/2019 9:36:54 AM By: [name] PA-C Entered By: [name] on 11/25/2019 09:36:53 [name] ([id number]) -------------------------------------------------------------------------------- Physical Exam Details Patient Name: [name], [name] Date of Service: 11/25/2019 8:45 AM Medical Record Number: [id number] Patient Account Number: [id number] Date of Birth/Sex: 1965/11/05 (54 y.o. M) Treating RN: Cornell Barman Primary Care Provider: Berneta Sages Other Clinician: Referring Provider: Berneta Sages Treating Provider/Extender: Skipper Cliche in Treatment: 27 Constitutional Well-nourished and well-hydrated in no acute distress. Respiratory normal breathing without difficulty. Psychiatric this patient is able to make decisions and demonstrates good insight into disease process. Alert and Oriented x 3. pleasant and cooperative. Notes Upon inspection patient's wound bed actually showed signs of having a sinus tract/tunnel to the roughly 9-10 o'clock location with regard to  his wound. I did perform debridement using scissors and forceps in order to clear away some of the callus skin over the surface of this area so we get a dressing to it I could also obtain a culture today. I did obtain the culture and then we did subsequently apply the dressings as well. Electronic Signature(s) Signed: 11/25/2019 9:37:40 AM By: [name] PA-C Entered By: [name] on 11/25/2019 09:37:40 [name] ([id number]) -------------------------------------------------------------------------------- Physician Orders Details Patient Name: [name] Date of Service: 11/25/2019 8:45 AM Medical Record Number: [id number] Patient Account Number: [id number] Date of Birth/Sex: May 12, 1965 (54 y.o. 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 - in office o Dial antibacterial soap, wash wounds, rinse and pat dry prior to dressing  wounds Anesthetic (add to Medication List) Wound #4R Right,Lateral Foot o Topical Lidocaine 4% cream applied to wound bed prior to debridement (In Clinic Only). Primary Wound Dressing Wound #4R Right,Lateral Foot o Gentamicin Sulfate Cream o Silver Alginate - Thin layer of gentamicin under the alginate Secondary Dressing Wound #4R Right,Lateral Foot o Conform/Kerlix - Offloading felt donut, dry gauze, roll gauze to cover Dressing Change Frequency Wound #4R  Right,Lateral Foot o Change Dressing Monday, Wednesday, Friday Follow-up Appointments Wound #4R Right,Lateral Foot o Return Appointment in 1 week. Off-Loading Wound #4R Right,Lateral Foot o Open toe surgical shoe with peg assist. - keep pressure off foot as much as possible. Medications-please add to medication list. Wound #4R Right,Lateral Foot o P.O. Antibiotics Laboratory o Bacteria identified in Wound by Culture (MICRO) - right lateral foot oooo LOINC Code: 4098-1 [name], [name] ([id number]) oooo Convenience Name: Wound culture routine Radiology o Magnetic Resonance Imaging (MRI) - Right foot Patient Medications Allergies: No Known Allergies Notifications Medication Indication Start End Augmentin 11/25/2019 DOSE 1 - oral 875 mg-125 mg tablet - 1 tablet oral taken 2 times per day for 14 days Electronic Signature(s) Signed: 11/25/2019 9:39:51 AM By: [name] PA-C Entered By: [name] on 11/25/2019 09:39:50 [name] ([id number]) -------------------------------------------------------------------------------- Problem List Details Patient Name: [name] Date of Service: 11/25/2019 8:45 AM Medical Record Number: [id number] Patient Account Number: [id number] Date of Birth/Sex: 1965/11/24 (54 y.o. 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 M) Treating RN: Cornell Barman Primary Care Provider: Berneta Sages Other Clinician: Referring Provider: Berneta Sages Treating Provider/Extender: Skipper Cliche in Treatment: 36 Subjective Chief Complaint Information obtained from Patient Right lateral foot ulcer History of Present Illness (HPI) This 54 year old male comes with an ulcerated area on the plantar aspect of the right foot which she's had for approximately a month. I have known him from a previous visit at [facility] wound center and was treated in the months of April and May 2016 and rapidly healed a left plantar ulcer with a total contact cast. He has been a diabetic for about 16 years and tries to keep active and is fairly compliant with his diabetes management. 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 Past medical history significant for hypertension, hyperlipidemia, and status post appendectomy 1993. He does not smoke or drink alcohol. 01/14/2015 -- the patient had tolerated his total contact cast very well and had no problems and has had no systemic symptoms. However when his total contact cast was cut open he had excessive amount of purulent drainage in spite of being on antibiotics. He had had a recent x-ray done in the ER 12/22/2014 which  showed IMPRESSION:No evidence of osseous erosion. Known soft tissue ulceration is not well characterized on radiograph. Scattered vascular calcifications seen. his last hemoglobin A1c in December was  7.3. He has been on Augmentin and doxycycline for the last 2 weeks. 01/21/2015 -- his culture grew rare growth of Pantoea species an MR moderate growth of Candida parapsilosis. it is sensitive to levofloxacin. He has not heard back from the insurance company regarding his hyperbaric oxygen therapy. His MRI has not been done yet and we will try and get him an earlier date 01/28/2015 -- MRI was done last night -- IMPRESSION:1. Soft tissue ulcer overlying the plantar aspect of the fifth metatarsal head extending to the cortex. Subcortical marrow edema in the fifth metatarsal head with corresponding T1 hypointensity is concerning for early osteomyelitis of the plantar lateral aspect of the fifth metatarsal head. Chest x-ray done on 01/14/2015 shows bronchiectatic changes without infiltrate. EKG done on generally 17 2017 shows a normal sinus rhythm and is a normal EKG. 02/04/2015 -- he was asked to see Dr. [name] last week and had 2 appointments but had to cancel both due to pressures of work. Last night he has woken up with severe pain in the foot and leg and it is swollen up. No fever or no change in his blood glucose. Addendum: I spoke with Dr. [name] who kindly agreed to accept the patient for inpatient therapy and have also opened to the hospitalist Dr. [name], and discuss details of the management including PICC line and repeat cultures. 02/12/2015-- -- was seen by Dr. [name] in the hospital and a PICC line was placed. He was to receive Ceftazidime 2 g every 12 hourly, oral levofloxacin 750 mg every 24 hourly and oral fluconazole 200 mg daily. The antibiotics were to be given for 4 weeks except the Diflucan was to be given for the first 2 weeks. Reviewed note from 02/10/2015 -- and Dr. [name] had recommended management for growth of MSSA and Serratia. He switched him from ceftazidime to ceftriaxone 2 g every 24 hours. Levofloxacin was stopped and he would continue on fluconazole for  another week. He had asked me to decide whether further imaging was necessary and whether surgical debridement of the infected bone was needed. He is doing well and has been off work for this week and we will keep him off the next week. 02/22/2015 -- he was seen by my colleague on 01/19/2015 and at that time an incision and drainage was done on his right lateral forefoot on the dorsum. Today when I probed this wound it is frankly draining pus and it communicates with the ulcer on the plantar aspect of his right foot. The patient is still on IV ceftriaxone 2 g every 24 hours and is to be seen by Dr. [name] on Friday. 03/19/2015 -- On 03/04/2015 I spoke to Dr. 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 He recommended operative debridement which would probably result in the fifth metatarsal head and toe amputation.The patient would be referred back to [place] once he was done with surgery. He was admitted to [facility] yesterday and had surgery done by podiatry for a right fifth metatarsal acute osteomyelitis with cellulitis and abscess. He had a right foot incision and drainage with fifth metatarsal partial amputation and removal of toe infected  bone and soft tissue with cultures. The wound was partially closed and packing of the distal end was done. Patient was already on cefepime 2 g IV every 8 hourly and put on vancomycin pending final cultures. He had grown moderate gram-negative rods, and later found to be rare diphtheroids. I received a call from Dr. Cannon Kettle the podiatrist and we discussed the above. On 03/10/2015 he was found positive for influenza a and has been put on Tamiflu. Since his discharge he has been seen by Dr. Cannon Kettle who is planning to remove his sutures this coming week. He was reviewed by Dr. [name] on  03/17/2015 and his Diflucan was stopped and vancomycin. After the 3 doses he is taking. He is going to change Ceftazidime to Zosyn 3.375 g IV every 8 hours. 03/25/2015 -- he was seen by the podiatrist a couple of days ago and the sutures were removed. She will follow back with him in 4 weeks' time and at that time x-rays will be taken and a custom molded insert would be made for his shoe. 04/01/2015 -- he was seen by Dr. [name] on 03/29/2015 who pulled the PICC line stopped his IV antibiotics and recommended starting doxycycline and levofloxacin for 2 weeks. He also stop the fluconazole. The patient will follow up with him only when necessary. [name], [name] ([id number]) Readmission: 03/15/17 on evaluation today patient presents for initial evaluation concerning the new issues although he has previously been evaluated in our clinic. Unfortunately the previous evaluation led to the patient having to proceed to amputation and so he is somewhat nervous about being here today. With that being said he has a very slight blister that occurred on the plantar aspect more medial on the right great toe that has been present for just a very short amount of time, several days. With that being said he felt like initially when he called that he somewhat overreacted but due to the fact that his previous issue led to amputation he is very cautious these days I explained that he did the right thing. With that being said he has been tolerating the dressing changes without complication mainly he just been covering this. He does not have any discomfort in secondary to neuropathy it's unlikely that he feels much. With that being said he does tell me that the issue that he had here is that he went barefoot when he knows he should not have which subsequently led to the blisters. He states is definitely not doing that anymore. His most recent hemoglobin A1c was December and registered 6.9 his ABI today was  1.1. 03/27/17 on evaluation today patient appears to be doing great in regard to his right great toe ulcer. He has been tolerating the dressing changes without complication. The good news is this is making excellent progress and he seems to be caring for this in an excellent fashion. I see no evidence of breakdown that would make me concerned that he was at risk for infection/amputation. This has obviously been his concern due to the fifth toe amputation that was necessitated previous when he had a similar issue. Nonetheless this seems to be progressing much more nicely. Readmission: 03/13/2019 upon evaluation today patient presents for reevaluation here in the clinic concerning issues he has been having with his right foot on the lateral portion at the proximal end of the metatarsal. Subsequently he tells me that this just opened up in the past couple of days or so and the erythema really began about  24 to 48 hours ago. Fortunately there is no signs of systemic infection. 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 That left him with this bony prominence that is where the wound is currently. There is erythema surrounding that does have me concerned about cellulitis. With that being said he was noncompressible as far as ABIs are concerned I do think we can need to check into arterial studies at this point. Patient does have a history of hypertension along with diabetes mellitus type 2. He also tends to develop quite a bit of callus white often. 03/20/2019 upon evaluation today patient appears to be doing very well in regard to his wounds currently. He has been tolerating the dressing changes without complication. Fortunately there is no signs of infection. His ABIs were good at this point and registered at 1.07 on the left and 1.09 on the right. In regard to the x-ray this was negative as well for any signs of osteomyelitis. The patient also seems to be  doing better in regard to the infection. He still has several days of the antibiotic left at this point best the Bactrim DS and he seems to be doing very well with this. Overall I am extremely pleased with the progress in 1 week's time he is going require some sharp debridement today however. 03/27/2019 upon evaluation today patient actually appears to be making some progress in regard to his wound. It is a little deeper but measuring smaller which is good news due to the fact that again were clear away some of the necrotic tissue which is why the depth is increasing a little bit. Nonetheless after like were getting very close to being down at a good wound bed. Fortunately there is no signs of active infection at this time. There is a little bit of erythema immediately surrounding the wound that we do want to be very cognizant of and careful about. For that reason I am going to go ahead and extend his antibiotic today for an additional 10 days that is the Bactrim. 04/03/2019 upon evaluation today patient appears to be making some progress. I do feel like the Annitta Needs is doing a good job along with the alginate is being packed into the wound space behind. He has been tolerating the dressing changes without complication. Fortunately there is no signs of active infection at this time. No fevers, chills, nausea, vomiting, or diarrhea. 04/10/2019 upon evaluation today patient appears to be making progress. The wound is not quite as deep but he still does not have a great wound surface yet for working on this and the Santyl does seem to be cleaning things up. Fortunately there is no evidence of active infection at this time. No fevers, chills, nausea, vomiting, or diarrhea. 04/17/2019 upon evaluation today patient appears to be doing excellent at this time in regard to his foot ulcer. I do believe the Santyl with the alginate packed in behind has been of benefit for him and I am very pleased in this regard. With  that being said I am feeling like the wound surface is improving quite a bit each time I see him still I do not believe we are at the point of stating that the wound bed is perfect but again were making progress. The patient is going require some sharp debridement today. 4/22; this is a patient who is using Santyl with calcium alginate. Apparently his wound dimensions have been improving. Patient is changing  the dressing himself. He has a surgical shoe. He works in a sitting position therefore is not on his feet all that much. There is undermining 05/01/2019 upon evaluation today patient appears to be doing a little better in regard to the size of his wound though he still has some depth. This does seem to be much cleaner has been using Santyl on the base of the wound followed by packing with silver alginate and behind. Nonetheless I do believe that based on what we are seeing we may need to switch to a collagen type dressing endoform may actually be excellent for him. 05/08/2019 upon evaluation today patient's wound actually appears to be showing better granulation tissue in the base of the wound. With that being said he does have some epiboly noted around the edges of the wound especially plantar and more distal. Subsequently this is can require sharp debridement today. Fortunately there is no signs of active systemic infection though I do feel like there is some local cellulitis noted at this point. The patient also notes has had increased drainage. 5/13; wound volume quite a bit improved this week down to 3 mm. Patient states pain and drainage better. Culture from last week grew staph aureus [not MRSA] and this is sensitive to quinolones and he is on Levaquin. HOWEVER he also grew abundant Enterococcus faecalis treatment of choice for this is ampicillin. Quinolone coverage is unreliable 5/20 completing the Augmentin. Small wound on the right lateral foot. Thick rolled edges around the wound. Using  silver alginate 05/29/2019 upon evaluation today patient appears to be doing better compared to last time I saw him. Fortunately there is no signs of active infection and I do feel like he is actually making good progress. Overall the quality of the wound bed along with the size looks improved and the inflammation/erythema that was previously noted by myself when I saw the couple weeks back actually has also resolved and this is great. Overall very pleased with how things stand. 06/06/2019 upon evaluation today patient actually appears to be doing quite well with regard to his foot ulcer with regard to some of the granulation were seen at this point. There does not appear to be any evidence of systemic or local infection although he still has discomfort I feel like things are moving in a slow but surely correct direction. I think we may need to help fill some of the space however even though he is using the collagen we may need to have something behind this such as a plain packing strip to try to help hold the collagen in the base of the wound. 06/12/2019 upon evaluation today patient appears to be doing well with regard to his foot ulcer. This seems to be making some slow but steady progress. The wound does not appear to be as deep today compared to where it was previous. Fortunately there is no signs of active infection at [name], [name]. ([id number]) this time. No fevers, chills, nausea, vomiting, or diarrhea. 06/19/2019 on evaluation today patient's wound does not appear to be doing quite as well as what I would like to see. Fortunately there is no signs of systemic infection at this time which is great news. With that being said I do believe that the patient needs to have a wound culture as I do believe he likely has a local infection that needs to be addressed. He is also can require some debridement today as well. 06/26/2019 upon evaluation today patient's wound actually  appears to be showing  signs of excellent improvement today. There does not appear to be any evidence of active infection which is great news. There does not appear to be significant erythema at this point. The patient does have improvement overall in the appearance of the infection he has been taking the Augmentin the culture came back showing no organism was grown at this point but nonetheless I do believe he has been doing well with the antibiotics I would continue that at this point. 07/03/2019 upon evaluation today patient appears to be doing about the same in regard to his wound. There does not appear to be any evidence of active infection and overall I am pleased with where things stand. The patient is tolerating the dressing changes without complication but it does not appear that they are packing the wound at this time which I think is going to slow down his healing prospects. 07/10/2019 upon evaluation today patient appears to be doing little better in my opinion in regard to the overall moisture collected in the base of the wound. They have been packing this with a little bit of silver alginate and that has done quite well. Overall I am pleased in that regard. Subsequently the redness does not appear to be any worse today I did actually go ahead and marked this since this can be 2 weeks between now and when I see him again I would make sure that if anything changes or worsens he will be able to notice this and go ahead and initiate treatment with the Augmentin. 07/22/2019 patient comes in today a little bit early for evaluation secondary to the fact that he was having issues with increased drainage and concern about infection. With that being said he does appear to have developed a blister area again proximal to where the original wound opening was in although the wound bed itself actually appears to be doing somewhat better he has had increased drainage and tracking of fluid here due to this blister. Nonetheless I  believe that that does need to be cleaned away today but again I am not really certain that I see a lot of evidence for infection at this point. 07/29/2019 on evaluation today patient actually appears to be doing better in regard to his foot ulcer. There is improvement compared to last week which is great news. Nonetheless he does come prepared to apply the total contact cast today which I think would be beneficial for him. He knows he is not supposed to drive he did bring his son with him today in order to drive him. 07/31/2019 upon evaluation today patient appears to be doing well with regard to his wound today. I do not see any signs of infection I do believe that he is definitely showing some signs of improvement here in the office in a couple days as far as the overall erythema and pressure getting to the wound area is concerned. With that being said I do believe that the cast has been of great benefit for him. 08/08/2019 upon evaluation today patient appears to be doing better in regard to his wound. Overall I feel like he is showing signs of improvement. The external measurement is really not bad at all although I did remove some of the callus in order to open up the wound itself the depth is actually coming quite well and I am extremely pleased in that regard I do not think we can to see much improvement externally until we get  the internal depth filled in and that again is improved. Fortunately there is no signs of active infection at this time. No fevers, chills, nausea, vomiting, or diarrhea. 08/15/2019 on evaluation today patient actually appears to be doing well with regard to his foot ulcer. This is showing some signs of minimal improvement from the standpoint of the depth and appearance of the wound as well as the edges of the wound. It is taking its time and to be honest I do believe that he seems to be progressing well. I am happy with the cast and what it is achieving at this point. Have  contemplated checking into Dermagraft for Apligraf for the patient but again he does seem to be making fairly good progress in my opinion. If it has not tremendously changed come next week I may consider one of the 2. 08/22/2019 on evaluation today patient appears to be making good progress with regard to his wound I am still very pleased with where things stand today. Overall I think that he is making good improvements week by week and I believe that we are getting continue with the Total contact cast I will likely switch to a silver alginate dressing. 08/29/2019 on evaluation today patient appears to be doing quite well with regard to his wound. Fortunately this is measuring quite small. There is no signs of active infection at this time although he did have a little bit of a callus area just to the side of his foot where we previously noted some fluid collecting underneath that was the case today as well. I did have to remove this. With that being said overall he seems to be doing quite well. 09/05/19 on evaluation today patient appears to be doing very well with regard to his original wound which is actually very small on evaluation today. With that being said he does have a blistered area that's more proximal to where this wound is that has been a little bit more concerned. Fortunately there is no signs of active infection at this time. I'm going to need to remove the blister tissue today. 10 09/12/2019 on evaluation today patient actually appears to be doing much better in regard to his wound. I think he is actually improved more without the cast that he was prior with the cast. Overall very pleased with where things stand today. 09/22/2019 upon evaluation today patient appears to be doing very well at this time in regard to his foot ulcer. In fact I feel like he is continue to make good progress. I see no signs right now that there is any active infection which is great news. Also see very little  evidence of pressure he does have some callus buildup but again this seems to be minimal. 09/29/2019 upon evaluation today patient appears to be doing well with regard to his foot ulcer. He has been tolerating the dressing changes without complication. Fortunately there is no signs of active infection at this time. No fevers, chills, nausea, vomiting, or diarrhea. 10/28/2019 upon evaluation today patient appears to be doing a little bit more poorly than last time I saw him as last time we assumed he was completely healed. Apparently that was not the case or else this is reopened but I tend to believe that this probably has never completely closed internally though it appeared to be. He has been using the offloading felt which seems to have done well there is no signs of redness no evidence of erythema all of which  is good news. Nonetheless I am concerned about the fact that he is having issues here with what appears to be a reopening of the wound. 11/04/2019 upon evaluation today patient's wound actually showed signs of fairly good improvement today which is great news the wound bed does not appear to be significantly callus covered and also does not appear to show any signs of significant infection or really any infection whatsoever. I am very pleased in this regard. In general I think that he is making good progress although this is a lot slower than he would like to see I think we are headed back in the correct direction. He is continuing to use the offloading felt. [name], [name] ([id number]) 11/18/2019 on evaluation today patient appears to be having some callus buildup and cracking that occurred at the wound site. Subsequently actually did have to perform a fairly significant debridement on the area to clean away some of the callus buildup he tolerated that today with some bleeding with that being said I do feel like the wound appear to be doing much better post debridement. 11/25/2019  upon evaluation today patient appears to be doing somewhat poorly in regard to his foot ulcer. Unfortunately he is having some issues with cellulitis along the lateral aspect of his foot. Unfortunately he seems to be having some issues today with infection/cellulitis in regard to the foot. It still towards the proximal/dorsal surface of where the wound is and this subsequently is the area that always seems to cause him trouble. 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 I do believe we may need to look into this further to ensure that there is nothing more significant going on considering that he seems to have an ongoing issue with this at this point. Objective Constitutional Well-nourished and well-hydrated in no acute distress. Vitals Time Taken: 9:09 AM, Height: 74 in, Weight: 308 lbs, BMI: 39.5, Temperature: 98.6 F, Pulse: 87 bpm, Respiratory Rate: 18 breaths/min, Blood Pressure: 145/87 mmHg, Capillary Blood Glucose: 116 mg/dl. Respiratory normal breathing without difficulty. Psychiatric this patient is able to make decisions and demonstrates good insight into disease process. Alert and Oriented x 3. pleasant and cooperative. General Notes: Upon inspection patient's wound bed actually showed signs of having a sinus tract/tunnel to the roughly 9-10 o'clock location with regard to his wound. I did perform debridement using scissors and forceps in order to clear away some of the callus skin over the surface of this area so we get a dressing to it I could also obtain a culture today. I did obtain the culture and then we did subsequently apply the dressings as well. Integumentary (Hair, Skin) Wound #4R status is Open. Original cause of wound was Gradually Appeared. The wound is located on the Right,Lateral Foot. The wound measures 0.6cm length x 0.8cm width x 0.4cm depth; 0.377cm^2 area and 0.151cm^3 volume. There is Fat Layer (Subcutaneous Tissue) exposed. There is no tunneling or  undermining noted. There is a small amount of serosanguineous drainage noted. The wound margin is flat and intact. There is large (67-100%) red granulation within the wound bed. There is a small (1-33%) amount of necrotic tissue within the wound bed including Adherent Slough. Assessment Active Problems ICD-10 Type 2 diabetes mellitus with foot ulcer 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d Cellulitis of right lower limb Corns and callosities Essential (primary) hypertension Acquired absence of other left toe(s) Procedures Wound #4R Pre-procedure diagnosis of Wound #4R is a  Diabetic Wound/Ulcer of the Lower Extremity located on the Right,Lateral Foot .Severity of Tissue Pre Debridement is: Fat layer exposed. There was a Excisional Skin/Subcutaneous Tissue Debridement with a total area of 0.48 sq cm performed by Tommie Sams., PA-C. With the following instrument(s): Scissors to remove Viable and Non-Viable tissue/material. Material removed includes Subcutaneous Tissue after achieving pain control using Lidocaine. 1 specimen was taken by a Swab and sent to the lab per facility protocol. A time out was conducted at 09:30, prior to the start of the procedure. A Moderate amount of bleeding was controlled with Pressure. The procedure was [name], [name] ([id number]) tolerated well. Post Debridement Measurements: 0.7cm length x 0.9cm width x 0.5cm depth; 0.247cm^3 volume. Character of Wound/Ulcer Post Debridement is stable. Severity of Tissue Post Debridement is: Fat layer exposed. Post procedure Diagnosis Wound #4R: Same as Pre-Procedure Plan Wound Cleansing: Wound #4R Right,Lateral Foot: Clean wound with Normal Saline. - in office Dial antibacterial soap, wash wounds, rinse and pat dry prior to dressing wounds Anesthetic (add to Medication List): Wound #4R Right,Lateral Foot: Topical Lidocaine 4% cream applied to wound bed prior to debridement (In  Clinic Only). Primary Wound Dressing: Wound #4R Right,Lateral Foot: Gentamicin Sulfate Cream Silver Alginate - Thin layer of gentamicin under the alginate Secondary Dressing: Wound #4R Right,Lateral Foot: Conform/Kerlix - Offloading felt donut, dry gauze, roll gauze to cover Dressing Change Frequency: Wound #4R Right,Lateral Foot: Change Dressing Monday, Wednesday, Friday Follow-up Appointments: Wound #4R Right,Lateral Foot: Return Appointment in 1 week. Off-Loading: Wound #4R Right,Lateral Foot: Open toe surgical shoe with peg assist. - keep pressure off foot as much as possible. Medications-please add to medication list.: Wound #4R Right,Lateral Foot: P.O. Antibiotics Laboratory ordered were: Wound culture routine - right lateral foot Radiology ordered were: Magnetic Resonance Imaging (MRI) - Right foot The following medication(s) was prescribed: Augmentin oral 875 mg-125 mg tablet 1 1 tablet oral taken 2 times per day for 14 days starting 11/25/2019 1. I would recommend currently that we go ahead and initiate treatment with Augmentin currently as he is previously done well with this when he has had an infection. He has been on the Augmentin back in July with great results. That was the most recent time. 2. I am also can recommend at this time that we go ahead and order an MRI for the patient to further evaluate for any deeper infection that could be a complicating factor for him currently especially considering the recurrent nature of having issues in the same location back-and-forth. 3. I am also can recommend the patient continue to wear his offloading shoe I think that still of utmost importance. We will see patient back for reevaluation in 1 week here in the clinic. If anything worsens or changes patient will contact our office for additional recommendations. I did mark the patient's erythema on his foot currently and advised that if he has any issues with anything spreading  outside of this area he should go to the ER ASAP as he may require IV antibiotics more urgently than what we would be able to get arranged as an outpatient. Patient voiced understanding. Electronic Signature(s) Signed: 11/25/2019 9:39:59 AM By: [name] PA-C Entered By: [name] on 11/25/2019 09:39:59 [name] ([id number]) -------------------------------------------------------------------------------- SuperBill Details Patient Name: [name] Date of Service: 11/25/2019 Medical Record Number: [id number] Patient Account Number: [id number] Date of Birth/Sex: 03/26/1965 (54 y.o. M) Treating RN: Cornell Barman Primary Care Provider: Berneta Sages Other Clinician: Referring Provider: Dagmar Hait,  Ravi Treating Provider/Extender: Jeri Cos Weeks in Treatment: 36 Diagnosis Coding ICD-10 Codes Code Description E11.621 Type 2 diabetes mellitus with foot ulcer L97.512 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d L03.115 Cellulitis of right lower limb L84 Corns and callosities I10 Essential (primary) hypertension Z89.422 Acquired absence of other left toe(s) Facility Procedures CPT4 Code: [id number] Description: 11042 - DEB SUBQ TISSUE 20 SQ CM/< Modifier: Quantity: 1 CPT4 Code: Description: ICD-10 Diagnosis Description L97.512 Non-pressure chronic ulcer of other part of right foot with fat layer ex Modifier: posed Quantity: Physician Procedures CPT4 Code: 0929574 Description: 73403 - WC PHYS LEVEL 4 - EST PT Modifier: 25 Quantity: 1 CPT4 Code: Description: ICD-10 Diagnosis Description E11.621 Type 2 diabetes mellitus with foot ulcer L97.512 Non-pressure chronic ulcer of other part of right foot with fat layer ex L03.115 Cellulitis of right lower limb L84 Corns and callosities Modifier: posed Quantity: CPT4 Code: [id number] Description: 11042 - WC PHYS SUBQ TISS 20 SQ CM Modifier: Quantity: 1 CPT4 Code: Description: ICD-10  Diagnosis Description L97.512 Non-pressure chronic ulcer of other part of right foot with fat layer ex Modifier: posed Quantity: Electronic Signature(s) Signed: 11/25/2019 9:40:27 AM By: [name] PA-C Entered By: [name] on 11/25/2019 09:40:26

## 2019-11-28 LAB — AEROBIC CULTURE W GRAM STAIN (SUPERFICIAL SPECIMEN)

## 2019-12-02 ENCOUNTER — Encounter: Payer: Managed Care, Other (non HMO) | Admitting: Physician Assistant

## 2019-12-02 ENCOUNTER — Ambulatory Visit
Admission: RE | Admit: 2019-12-02 | Discharge: 2019-12-02 | Disposition: A | Discharge status: Home / Self Care | Payer: Managed Care, Other (non HMO) | Source: Ambulatory Visit | Attending: Physician Assistant | Admitting: Physician Assistant

## 2019-12-02 ENCOUNTER — Other Ambulatory Visit: Payer: Self-pay

## 2019-12-02 ENCOUNTER — Other Ambulatory Visit: Payer: Self-pay | Admitting: Physician Assistant

## 2019-12-02 DIAGNOSIS — B999 Unspecified infectious disease: Secondary | ICD-10-CM

## 2019-12-02 DIAGNOSIS — E11621 Type 2 diabetes mellitus with foot ulcer: Secondary | ICD-10-CM | POA: Diagnosis not present

## 2019-12-02 NOTE — Progress Notes (Signed)
[name], [name] ([id number]) Visit Report for 12/02/2019 Chief Complaint Document Details Patient Name: [name], [name]. Date of Service: 12/02/2019 9:00 AM Medical Record Number: [id number] Patient Account Number: [id number] Date of Birth/Sex: Aug 11, 1965 (54 y.o. M) Treating RN: Cornell Barman Primary Care Provider: Berneta Sages Other Clinician: Referring Provider: Berneta Sages Treating Provider/Extender: Skipper Cliche in Treatment: 37 Information Obtained from: Patient Chief Complaint Right lateral foot ulcer Electronic Signature(s) Signed: 12/02/2019 9:38:19 AM By: [name] PA-C Entered By: [name] on 12/02/2019 09:38:18 [name] ([id number]) -------------------------------------------------------------------------------- HPI Details Patient Name: [name] Date of Service: 12/02/2019 9:00 AM Medical Record Number: [id number] Patient Account Number: [id number] Date of Birth/Sex: December 29, 1965 (54 y.o. M) Treating RN: Cornell Barman Primary Care Provider: Berneta Sages Other Clinician: Referring Provider: Berneta Sages Treating Provider/Extender: Skipper Cliche in Treatment: 54 History of Present Illness HPI Description: This 54 year old male comes with an ulcerated area on the plantar aspect of the right foot which she's had for approximately a month. I have known him from a previous visit at [facility] wound center and was treated in the months of April and May 2016 and rapidly healed a left plantar ulcer with a total contact cast. He has been a diabetic for about 16 years and tries to keep active and is fairly compliant with his diabetes management. 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 Past medical history significant for hypertension, hyperlipidemia, and status post appendectomy 1993. He does not smoke or drink alcohol. 01/14/2015 -- the patient had tolerated his total contact cast very well and had no problems and has had no systemic  symptoms. However when his total contact cast was cut open he had excessive amount of purulent drainage in spite of being on antibiotics. He had had a recent x-ray done in the ER 12/22/2014 which showed IMPRESSION:No evidence of osseous erosion. Known soft tissue ulceration is not well characterized on radiograph. Scattered vascular calcifications seen. his last hemoglobin A1c in December was 7.3. He has been on Augmentin and doxycycline for the last 2 weeks. 01/21/2015 -- his culture grew rare growth of Pantoea species an MR moderate growth of Candida parapsilosis. it is sensitive to levofloxacin. He has not heard back from the insurance company regarding his hyperbaric oxygen therapy. His MRI has not been done yet and we will try and get him an earlier date 01/28/2015 -- MRI was done last night -- IMPRESSION:1. Soft tissue ulcer overlying the plantar aspect of the fifth metatarsal head extending to the cortex. Subcortical marrow edema in the fifth metatarsal head with corresponding T1 hypointensity is concerning for early osteomyelitis of the plantar lateral aspect of the fifth metatarsal head. Chest x-ray done on 01/14/2015 shows bronchiectatic changes without infiltrate. EKG done on generally 17 2017 shows a normal sinus rhythm and is a normal EKG. 02/04/2015 -- he was asked to see Dr. [name] last week and had 2 appointments but had to cancel both due to pressures of work. Last night he has woken up with severe pain in the foot and leg and it is swollen up. No fever or no change in his blood glucose. Addendum: I spoke with Dr. [name] who kindly agreed to accept the patient for inpatient therapy and have also opened to the hospitalist Dr. [name], and discuss details of the management including PICC line and repeat cultures. 02/12/2015-- -- was seen by Dr. [name] in the hospital and a PICC line was placed. He was to receive Ceftazidime 2 g every  12 hourly, oral levofloxacin 750 mg every  24 hourly and oral fluconazole 200 mg daily. The antibiotics were to be given for 4 weeks except the Diflucan was to be given for the first 2 weeks. Reviewed note from 02/10/2015 -- and Dr. [name] had recommended management for growth of MSSA and Serratia. He switched him from ceftazidime to ceftriaxone 2 g every 24 hours. Levofloxacin was stopped and he would continue on fluconazole for another week. He had asked me to decide whether further imaging was necessary and whether surgical debridement of the infected bone was needed. He is doing well and has been off work for this week and we will keep him off the next week. 02/22/2015 -- he was seen by my colleague on 01/19/2015 and at that time an incision and drainage was done on his right lateral forefoot on the dorsum. Today when I probed this wound it is frankly draining pus and it communicates with the ulcer on the plantar aspect of his right foot. The patient is still on IV ceftriaxone 2 g every 24 hours and is to be seen by Dr. [name] on Friday. 03/19/2015 -- On 03/04/2015 I spoke to Dr. 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 He recommended operative debridement which would probably result in the fifth metatarsal head and toe amputation.The patient would be referred back to [place] once he was done with surgery. He was admitted to [facility] yesterday and had surgery done by podiatry for a right fifth metatarsal acute osteomyelitis with cellulitis and abscess. He had a right foot incision and drainage with fifth metatarsal partial amputation and removal of toe infected bone and soft tissue with cultures. The wound was partially closed and packing of the distal end was done. Patient was already on cefepime 2 g IV every 8 hourly and put on vancomycin pending final cultures.  He had grown moderate gram-negative rods, and later found to be rare diphtheroids. I received a call from Dr. Cannon Kettle the podiatrist and we discussed the above. On 03/10/2015 he was found positive for influenza a and has been put on Tamiflu. Since his discharge he has been seen by Dr. Cannon Kettle who is planning to remove his sutures this coming week. He was reviewed by Dr. [name] on 03/17/2015 and his Diflucan was stopped and vancomycin. After the 3 doses he is taking. He is going to change Ceftazidime to Zosyn 3.375 g IV every 8 hours. 03/25/2015 -- he was seen by the podiatrist a couple of days ago and the sutures were removed. She will follow back with him in 4 weeks' time and at that time x-rays will be taken and a custom molded insert would be made for his shoe. 04/01/2015 -- he was seen by Dr. [name] on 03/29/2015 who pulled the PICC line stopped his IV antibiotics and recommended starting doxycycline and levofloxacin for 2 weeks. He also stop the fluconazole. The patient will follow up with him only when necessary. Readmission: 03/15/17 on evaluation today patient presents for initial evaluation concerning the new issues although he has previously been evaluated in our clinic. Unfortunately the previous evaluation led to the patient having to proceed to amputation and so he is somewhat nervous about being here GILBERTO, STANFORTH ([id number]) today. With that being said he has a very slight blister  that occurred on the plantar aspect more medial on the right great toe that has been present for just a very short amount of time, several days. With that being said he felt like initially when he called that he somewhat overreacted but due to the fact that his previous issue led to amputation he is very cautious these days I explained that he did the right thing. With that being said he has been tolerating the dressing changes without complication mainly he just been covering this. He does not  have any discomfort in secondary to neuropathy it's unlikely that he feels much. With that being said he does tell me that the issue that he had here is that he went barefoot when he knows he should not have which subsequently led to the blisters. He states is definitely not doing that anymore. His most recent hemoglobin A1c was December and registered 6.9 his ABI today was 1.1. 03/27/17 on evaluation today patient appears to be doing great in regard to his right great toe ulcer. He has been tolerating the dressing changes without complication. The good news is this is making excellent progress and he seems to be caring for this in an excellent fashion. I see no evidence of breakdown that would make me concerned that he was at risk for infection/amputation. This has obviously been his concern due to the fifth toe amputation that was necessitated previous when he had a similar issue. Nonetheless this seems to be progressing much more nicely. Readmission: 03/13/2019 upon evaluation today patient presents for reevaluation here in the clinic concerning issues he has been having with his right foot on the lateral portion at the proximal end of the metatarsal. Subsequently he tells me that this just opened up in the past couple of days or so and the erythema really began about 24 to 48 hours ago. Fortunately there is no signs of systemic infection. 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 That left him with this bony prominence that is where the wound is currently. There is erythema surrounding that does have me concerned about cellulitis. With that being said he was noncompressible as far as ABIs are concerned I do think we can need to check into arterial studies at this point. Patient does have a history of hypertension along with diabetes mellitus type 2. He also tends to develop quite a bit of callus white often. 03/20/2019 upon evaluation today patient  appears to be doing very well in regard to his wounds currently. He has been tolerating the dressing changes without complication. Fortunately there is no signs of infection. His ABIs were good at this point and registered at 1.07 on the left and 1.09 on the right. In regard to the x-ray this was negative as well for any signs of osteomyelitis. The patient also seems to be doing better in regard to the infection. He still has several days of the antibiotic left at this point best the Bactrim DS and he seems to be doing very well with this. Overall I am extremely pleased with the progress in 1 week's time he is going require some sharp debridement today however. 03/27/2019 upon evaluation today patient actually appears to be making some progress in regard to his wound. It is a little deeper but measuring smaller which is good news due to the fact that again were clear away some of the necrotic tissue which is why the depth is increasing a little  bit. Nonetheless after like were getting very close to being down at a good wound bed. Fortunately there is no signs of active infection at this time. There is a little bit of erythema immediately surrounding the wound that we do want to be very cognizant of and careful about. For that reason I am going to go ahead and extend his antibiotic today for an additional 10 days that is the Bactrim. 04/03/2019 upon evaluation today patient appears to be making some progress. I do feel like the Annitta Needs is doing a good job along with the alginate is being packed into the wound space behind. He has been tolerating the dressing changes without complication. Fortunately there is no signs of active infection at this time. No fevers, chills, nausea, vomiting, or diarrhea. 04/10/2019 upon evaluation today patient appears to be making progress. The wound is not quite as deep but he still does not have a great wound surface yet for working on this and the Santyl does seem to be  cleaning things up. Fortunately there is no evidence of active infection at this time. No fevers, chills, nausea, vomiting, or diarrhea. 04/17/2019 upon evaluation today patient appears to be doing excellent at this time in regard to his foot ulcer. I do believe the Santyl with the alginate packed in behind has been of benefit for him and I am very pleased in this regard. With that being said I am feeling like the wound surface is improving quite a bit each time I see him still I do not believe we are at the point of stating that the wound bed is perfect but again were making progress. The patient is going require some sharp debridement today. 4/22; this is a patient who is using Santyl with calcium alginate. Apparently his wound dimensions have been improving. Patient is changing the dressing himself. He has a surgical shoe. He works in a sitting position therefore is not on his feet all that much. There is undermining 05/01/2019 upon evaluation today patient appears to be doing a little better in regard to the size of his wound though he still has some depth. This does seem to be much cleaner has been using Santyl on the base of the wound followed by packing with silver alginate and behind. Nonetheless I do believe that based on what we are seeing we may need to switch to a collagen type dressing endoform may actually be excellent for him. 05/08/2019 upon evaluation today patient's wound actually appears to be showing better granulation tissue in the base of the wound. With that being said he does have some epiboly noted around the edges of the wound especially plantar and more distal. Subsequently this is can require sharp debridement today. Fortunately there is no signs of active systemic infection though I do feel like there is some local cellulitis noted at this point. The patient also notes has had increased drainage. 5/13; wound volume quite a bit improved this week down to 3 mm. Patient states  pain and drainage better. Culture from last week grew staph aureus [not MRSA] and this is sensitive to quinolones and he is on Levaquin. HOWEVER he also grew abundant Enterococcus faecalis treatment of choice for this is ampicillin. Quinolone coverage is unreliable 5/20 completing the Augmentin. Small wound on the right lateral foot. Thick rolled edges around the wound. Using silver alginate 05/29/2019 upon evaluation today patient appears to be doing better compared to last time I saw him. Fortunately there is no signs of  active infection and I do feel like he is actually making good progress. Overall the quality of the wound bed along with the size looks improved and the inflammation/erythema that was previously noted by myself when I saw the couple weeks back actually has also resolved and this is great. Overall very pleased with how things stand. 06/06/2019 upon evaluation today patient actually appears to be doing quite well with regard to his foot ulcer with regard to some of the granulation were seen at this point. There does not appear to be any evidence of systemic or local infection although he still has discomfort I feel like things are moving in a slow but surely correct direction. I think we may need to help fill some of the space however even though he is using the collagen we may need to have something behind this such as a plain packing strip to try to help hold the collagen in the base of the wound. 06/12/2019 upon evaluation today patient appears to be doing well with regard to his foot ulcer. This seems to be making some slow but steady progress. The wound does not appear to be as deep today compared to where it was previous. Fortunately there is no signs of active infection at this time. No fevers, chills, nausea, vomiting, or diarrhea. 06/19/2019 on evaluation today patient's wound does not appear to be doing quite as well as what I would like to see. Fortunately there is no signs of  systemic infection at this time which is great news. With that being said I do believe that the patient needs to have a wound culture as I do HEWITT, GARNER. ([id number]) believe he likely has a local infection that needs to be addressed. He is also can require some debridement today as well. 06/26/2019 upon evaluation today patient's wound actually appears to be showing signs of excellent improvement today. There does not appear to be any evidence of active infection which is great news. There does not appear to be significant erythema at this point. The patient does have improvement overall in the appearance of the infection he has been taking the Augmentin the culture came back showing no organism was grown at this point but nonetheless I do believe he has been doing well with the antibiotics I would continue that at this point. 07/03/2019 upon evaluation today patient appears to be doing about the same in regard to his wound. There does not appear to be any evidence of active infection and overall I am pleased with where things stand. The patient is tolerating the dressing changes without complication but it does not appear that they are packing the wound at this time which I think is going to slow down his healing prospects. 07/10/2019 upon evaluation today patient appears to be doing little better in my opinion in regard to the overall moisture collected in the base of the wound. They have been packing this with a little bit of silver alginate and that has done quite well. Overall I am pleased in that regard. Subsequently the redness does not appear to be any worse today I did actually go ahead and marked this since this can be 2 weeks between now and when I see him again I would make sure that if anything changes or worsens he will be able to notice this and go ahead and initiate treatment with the Augmentin. 07/22/2019 patient comes in today a little bit early for evaluation secondary to the  fact that he  was having issues with increased drainage and concern about infection. With that being said he does appear to have developed a blister area again proximal to where the original wound opening was in although the wound bed itself actually appears to be doing somewhat better he has had increased drainage and tracking of fluid here due to this blister. Nonetheless I believe that that does need to be cleaned away today but again I am not really certain that I see a lot of evidence for infection at this point. 07/29/2019 on evaluation today patient actually appears to be doing better in regard to his foot ulcer. There is improvement compared to last week which is great news. Nonetheless he does come prepared to apply the total contact cast today which I think would be beneficial for him. He knows he is not supposed to drive he did bring his son with him today in order to drive him. 07/31/2019 upon evaluation today patient appears to be doing well with regard to his wound today. I do not see any signs of infection I do believe that he is definitely showing some signs of improvement here in the office in a couple days as far as the overall erythema and pressure getting to the wound area is concerned. With that being said I do believe that the cast has been of great benefit for him. 08/08/2019 upon evaluation today patient appears to be doing better in regard to his wound. Overall I feel like he is showing signs of improvement. The external measurement is really not bad at all although I did remove some of the callus in order to open up the wound itself the depth is actually coming quite well and I am extremely pleased in that regard I do not think we can to see much improvement externally until we get the internal depth filled in and that again is improved. Fortunately there is no signs of active infection at this time. No fevers, chills, nausea, vomiting, or diarrhea. 08/15/2019 on evaluation today  patient actually appears to be doing well with regard to his foot ulcer. This is showing some signs of minimal improvement from the standpoint of the depth and appearance of the wound as well as the edges of the wound. It is taking its time and to be honest I do believe that he seems to be progressing well. I am happy with the cast and what it is achieving at this point. Have contemplated checking into Dermagraft for Apligraf for the patient but again he does seem to be making fairly good progress in my opinion. If it has not tremendously changed come next week I may consider one of the 2. 08/22/2019 on evaluation today patient appears to be making good progress with regard to his wound I am still very pleased with where things stand today. Overall I think that he is making good improvements week by week and I believe that we are getting continue with the Total contact cast I will likely switch to a silver alginate dressing. 08/29/2019 on evaluation today patient appears to be doing quite well with regard to his wound. Fortunately this is measuring quite small. There is no signs of active infection at this time although he did have a little bit of a callus area just to the side of his foot where we previously noted some fluid collecting underneath that was the case today as well. I did have to remove this. With that being said overall he seems to be  doing quite well. 09/05/19 on evaluation today patient appears to be doing very well with regard to his original wound which is actually very small on evaluation today. With that being said he does have a blistered area that's more proximal to where this wound is that has been a little bit more concerned. Fortunately there is no signs of active infection at this time. I'm going to need to remove the blister tissue today. 10 09/12/2019 on evaluation today patient actually appears to be doing much better in regard to his wound. I think he is actually improved  more without the cast that he was prior with the cast. Overall very pleased with where things stand today. 09/22/2019 upon evaluation today patient appears to be doing very well at this time in regard to his foot ulcer. In fact I feel like he is continue to make good progress. I see no signs right now that there is any active infection which is great news. Also see very little evidence of pressure he does have some callus buildup but again this seems to be minimal. 09/29/2019 upon evaluation today patient appears to be doing well with regard to his foot ulcer. He has been tolerating the dressing changes without complication. Fortunately there is no signs of active infection at this time. No fevers, chills, nausea, vomiting, or diarrhea. 10/28/2019 upon evaluation today patient appears to be doing a little bit more poorly than last time I saw him as last time we assumed he was completely healed. Apparently that was not the case or else this is reopened but I tend to believe that this probably has never completely closed internally though it appeared to be. He has been using the offloading felt which seems to have done well there is no signs of redness no evidence of erythema all of which is good news. Nonetheless I am concerned about the fact that he is having issues here with what appears to be a reopening of the wound. 11/04/2019 upon evaluation today patient's wound actually showed signs of fairly good improvement today which is great news the wound bed does not appear to be significantly callus covered and also does not appear to show any signs of significant infection or really any infection whatsoever. I am very pleased in this regard. In general I think that he is making good progress although this is a lot slower than he would like to see I think we are headed back in the correct direction. He is continuing to use the offloading felt. 11/18/2019 on evaluation today patient appears to be having  some callus buildup and cracking that occurred at the wound site. Subsequently actually did have to perform a fairly significant debridement on the area to clean away some of the callus buildup he tolerated that today with some bleeding with that being said I do feel like the wound appear to be doing much better post debridement. THOMA, PAULSEN ([id number]) 11/25/2019 upon evaluation today patient appears to be doing somewhat poorly in regard to his foot ulcer. Unfortunately he is having some issues with cellulitis along the lateral aspect of his foot. Unfortunately he seems to be having some issues today with infection/cellulitis in regard to the foot. It still towards the proximal/dorsal surface of where the wound is and this subsequently is the area that always seems to cause him trouble. 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 I do believe we may need to look into this further to ensure that there is  nothing more significant going on considering that he seems to have an ongoing issue with this at this point. 12/02/2019 upon evaluation today patient appears to be doing better in regard to his foot ulcer. This is great news. Fortunately there is no signs of active infection systemically which is also excellent. He has been tolerating the dressing changes without complication. With that being said he does tell me that the Augmentin has been causing him some trouble as far as headaches are concerned since he is been taking it. Is unsure if it is directly related or not. Nonetheless as I explained to him it is possible that this could be something that he is experiencing as a result of the antibiotic. The good news is there are other options that we could switch him to that would work equally well while at the same time seeing if indeed the antibiotic was causing the headaches. He does want to proceed with that. Electronic Signature(s) Signed: 12/02/2019 9:56:55 AM By: [name]  PA-C Entered By: [name] on 12/02/2019 09:56:55 [name] ([id number]) -------------------------------------------------------------------------------- Physical Exam Details Patient Name: JAZEN, SPRAGGINS Date of Service: 12/02/2019 9:00 AM Medical Record Number: [id number] Patient Account Number: [id number] Date of Birth/Sex: [birth year] (54 y.o. M) Treating RN: Cornell Barman Primary Care Provider: Berneta Sages Other Clinician: Referring Provider: Berneta Sages Treating Provider/Extender: Skipper Cliche in Treatment: 1 Constitutional Well-nourished and well-hydrated in no acute distress. Respiratory normal breathing without difficulty. Psychiatric this patient is able to make decisions and demonstrates good insight into disease process. Alert and Oriented x 3. pleasant and cooperative. Notes On inspection patient's wound actually appears to be doing better which is great news there is no signs of active infection overall I do see a lot of new epithelial growth and granulation tissue at this time. No sharp debridement was necessary today Electronic Signature(s) Signed: 12/02/2019 9:57:34 AM By: [name] PA-C Entered By: [name] on 12/02/2019 09:57:33 [name] ([id number]) -------------------------------------------------------------------------------- Physician Orders Details Patient Name: [name] Date of Service: 12/02/2019 9:00 AM Medical Record Number: [id number] Patient Account Number: [id number] Date of Birth/Sex: 10-07-1965 (54 y.o. 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 - in office o Dial antibacterial soap, wash wounds, rinse and pat dry prior to dressing wounds Anesthetic (add to Medication List) Wound #4R Right,Lateral Foot o Topical Lidocaine 4% cream applied to wound bed prior to debridement (In Clinic Only). Primary Wound Dressing Wound #4R Right,Lateral Foot o Silver Alginate Secondary Dressing Wound #4R Right,Lateral Foot o Conform/Kerlix - Offloading felt donut, dry gauze, roll gauze to cover Dressing Change Frequency Wound #4R Right,Lateral Foot o Change Dressing Monday, Wednesday, Friday Follow-up Appointments Wound #4R Right,Lateral Foot o Return Appointment in 1 week. Off-Loading Wound #4R Right,Lateral Foot o Open toe surgical shoe with peg assist. - keep pressure off foot as much as possible. Medications-please add to medication list. Wound #4R Right,Lateral Foot o P.O. Antibiotics Radiology o X-ray, foot - 3-view right Patient Medications  CHUKWUKA, FESTA ([id number]) Allergies: No Known Allergies Notifications Medication Indication Start End doxycycline hyclate 12/02/2019 DOSE 1 - oral 100 mg capsule - 1 capsule oral taken 2 times per day for 14 days Electronic Signature(s) Signed: 12/02/2019 10:02:01 AM By: [name] PA-C Entered By: [name] on 12/02/2019 10:02:00 [name] ([id number]) -------------------------------------------------------------------------------- Problem List Details Patient Name: [name] Date of Service: 12/02/2019 9:00 AM Medical Record Number: [id number] Patient Account Number: [id number] Date of Birth/Sex: [birth year] (54 y.o. 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 M) Treating RN: Cornell Barman Primary Care Provider: Berneta Sages Other Clinician: Referring Provider: Berneta Sages Treating Provider/Extender: Skipper Cliche in Treatment: 37 Subjective Chief Complaint Information obtained from Patient Right lateral foot ulcer History of Present Illness (HPI) This 54 year old male comes with an ulcerated area on the plantar aspect of the right foot which she's had for approximately a month. I have known him from a previous visit at [facility] wound center and was treated in the months of April and May 2016 and rapidly healed a left plantar ulcer with a total contact cast. He has been a diabetic for about 16 years and tries to keep active and is fairly compliant with his diabetes management. 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 Past medical history significant for hypertension, hyperlipidemia, and status post  appendectomy 1993. He does not smoke or drink alcohol. 01/14/2015 -- the patient had tolerated his total contact cast very well and had no problems and has had no systemic symptoms. However when his total contact cast was cut open he had excessive amount of purulent drainage in spite of being on antibiotics. He had had a recent x-ray done in the ER 12/22/2014 which showed IMPRESSION:No evidence of osseous erosion. Known soft tissue ulceration is not well characterized on radiograph. Scattered vascular calcifications seen. his last hemoglobin A1c in December was 7.3. He has been on Augmentin and doxycycline for the last 2 weeks. 01/21/2015 -- his culture grew rare growth of Pantoea species an MR moderate growth of Candida parapsilosis. it is sensitive to levofloxacin. He has not heard back from the insurance company regarding his hyperbaric oxygen therapy. His MRI has not been done yet and we will try and get him an earlier date 01/28/2015 -- MRI was done last night -- IMPRESSION:1. Soft tissue ulcer overlying the plantar aspect of the fifth metatarsal head extending to the cortex. Subcortical marrow edema in the fifth metatarsal head with corresponding T1 hypointensity is concerning for early osteomyelitis of the plantar lateral aspect of the fifth metatarsal head. Chest x-ray done on 01/14/2015 shows bronchiectatic changes without infiltrate. EKG done on  generally 17 2017 shows a normal sinus rhythm and is a normal EKG. 02/04/2015 -- he was asked to see Dr. [name] last week and had 2 appointments but had to cancel both due to pressures of work. Last night he has woken up with severe pain in the foot and leg and it is swollen up. No fever or no change in his blood glucose. Addendum: I spoke with Dr. [name] who kindly agreed to accept the patient for inpatient therapy and have also opened to the hospitalist Dr. [name], and discuss details of the management including PICC line and repeat  cultures. 02/12/2015-- -- was seen by Dr. [name] in the hospital and a PICC line was placed. He was to receive Ceftazidime 2 g every 12 hourly, oral levofloxacin 750 mg every 24 hourly and oral fluconazole 200 mg daily. The antibiotics were to be given for 4 weeks except the Diflucan was to be given for the first 2 weeks. Reviewed note from 02/10/2015 -- and Dr. [name] had recommended management for growth of MSSA and Serratia. He switched him from ceftazidime to ceftriaxone 2 g every 24 hours. Levofloxacin was stopped and he would continue on fluconazole for another week. He had asked me to decide whether further imaging was necessary and whether surgical debridement of the infected bone was needed. He is doing well and has been off work for this week and we will keep him off the next week. 02/22/2015 -- he was seen by my colleague on 01/19/2015 and at that time an incision and drainage was done on his right lateral forefoot on the dorsum. Today when I probed this wound it is frankly draining pus and it communicates with the ulcer on the plantar aspect of his right foot. The patient is still on IV ceftriaxone 2 g every 24 hours and is to be seen by Dr. [name] on Friday. 03/19/2015 -- On 03/04/2015 I spoke to Dr. 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 He recommended operative debridement which would probably result in the fifth metatarsal head and toe amputation.The patient would be referred back to [place] once he was done with surgery. He was admitted to [facility] yesterday and had surgery done by podiatry for a right fifth metatarsal acute osteomyelitis with cellulitis and abscess. He had a right foot incision and drainage with fifth metatarsal partial amputation and removal of toe infected bone and soft tissue with  cultures. The wound was partially closed and packing of the distal end was done. Patient was already on cefepime 2 g IV every 8 hourly and put on vancomycin pending final cultures. He had grown moderate gram-negative rods, and later found to be rare diphtheroids. I received a call from Dr. Cannon Kettle the podiatrist and we discussed the above. On 03/10/2015 he was found positive for influenza a and has been put on Tamiflu. Since his discharge he has been seen by Dr. Cannon Kettle who is planning to remove his sutures this coming week. He was reviewed by Dr. [name] on 03/17/2015 and his Diflucan was stopped and vancomycin. After the 3 doses he is taking. He is going to change Ceftazidime to Zosyn 3.375 g IV every 8 hours. 03/25/2015 -- he was seen by the podiatrist a couple of days ago and the sutures were removed. She will follow back with  him in 4 weeks' time and at that time x-rays will be taken and a custom molded insert would be made for his shoe. 04/01/2015 -- he was seen by Dr. [name] on 03/29/2015 who pulled the PICC line stopped his IV antibiotics and recommended starting doxycycline and levofloxacin for 2 weeks. He also stop the fluconazole. The patient will follow up with him only when necessary. KEARY, WATERSON ([id number]) Readmission: 03/15/17 on evaluation today patient presents for initial evaluation concerning the new issues although he has previously been evaluated in our clinic. Unfortunately the previous evaluation led to the patient having to proceed to amputation and so he is somewhat nervous about being here today. With that being said he has a very slight blister that occurred on the plantar aspect more medial on the right great toe that has been present for just a very short amount of time, several days. With that being said he felt like initially when he called that he somewhat overreacted but due to the fact that his previous issue led to amputation he is very cautious  these days I explained that he did the right thing. With that being said he has been tolerating the dressing changes without complication mainly he just been covering this. He does not have any discomfort in secondary to neuropathy it's unlikely that he feels much. With that being said he does tell me that the issue that he had here is that he went barefoot when he knows he should not have which subsequently led to the blisters. He states is definitely not doing that anymore. His most recent hemoglobin A1c was December and registered 6.9 his ABI today was 1.1. 03/27/17 on evaluation today patient appears to be doing great in regard to his right great toe ulcer. He has been tolerating the dressing changes without complication. The good news is this is making excellent progress and he seems to be caring for this in an excellent fashion. I see no evidence of breakdown that would make me concerned that he was at risk for infection/amputation. This has obviously been his concern due to the fifth toe amputation that was necessitated previous when he had a similar issue. Nonetheless this seems to be progressing much more nicely. Readmission: 03/13/2019 upon evaluation today patient presents for reevaluation here in the clinic concerning issues he has been having with his right foot on the lateral portion at the proximal end of the metatarsal. Subsequently he tells me that this just opened up in the past couple of days or so and the erythema really began about 24 to 48 hours ago. Fortunately there is no signs of systemic infection. 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 That left him with this bony prominence that is where the wound is currently. There is erythema surrounding that does have me concerned about cellulitis. With that being said he was noncompressible as far as ABIs are concerned I do think we can need to check into arterial studies at this point.  Patient does have a history of hypertension along with diabetes mellitus type 2. He also tends to develop quite a bit of callus white often. 03/20/2019 upon evaluation today patient appears to be doing very well in regard to his wounds currently. He has been tolerating the dressing changes without complication. Fortunately there is no signs of infection. His ABIs were good at this point and registered at 1.07 on the left and 1.09 on the right.  In regard to the x-ray this was negative as well for any signs of osteomyelitis. The patient also seems to be doing better in regard to the infection. He still has several days of the antibiotic left at this point best the Bactrim DS and he seems to be doing very well with this. Overall I am extremely pleased with the progress in 1 week's time he is going require some sharp debridement today however. 03/27/2019 upon evaluation today patient actually appears to be making some progress in regard to his wound. It is a little deeper but measuring smaller which is good news due to the fact that again were clear away some of the necrotic tissue which is why the depth is increasing a little bit. Nonetheless after like were getting very close to being down at a good wound bed. Fortunately there is no signs of active infection at this time. There is a little bit of erythema immediately surrounding the wound that we do want to be very cognizant of and careful about. For that reason I am going to go ahead and extend his antibiotic today for an additional 10 days that is the Bactrim. 04/03/2019 upon evaluation today patient appears to be making some progress. I do feel like the Annitta Needs is doing a good job along with the alginate is being packed into the wound space behind. He has been tolerating the dressing changes without complication. Fortunately there is no signs of active infection at this time. No fevers, chills, nausea, vomiting, or diarrhea. 04/10/2019 upon evaluation  today patient appears to be making progress. The wound is not quite as deep but he still does not have a great wound surface yet for working on this and the Santyl does seem to be cleaning things up. Fortunately there is no evidence of active infection at this time. No fevers, chills, nausea, vomiting, or diarrhea. 04/17/2019 upon evaluation today patient appears to be doing excellent at this time in regard to his foot ulcer. I do believe the Santyl with the alginate packed in behind has been of benefit for him and I am very pleased in this regard. With that being said I am feeling like the wound surface is improving quite a bit each time I see him still I do not believe we are at the point of stating that the wound bed is perfect but again were making progress. The patient is going require some sharp debridement today. 4/22; this is a patient who is using Santyl with calcium alginate. Apparently his wound dimensions have been improving. Patient is changing the dressing himself. He has a surgical shoe. He works in a sitting position therefore is not on his feet all that much. There is undermining 05/01/2019 upon evaluation today patient appears to be doing a little better in regard to the size of his wound though he still has some depth. This does seem to be much cleaner has been using Santyl on the base of the wound followed by packing with silver alginate and behind. Nonetheless I do believe that based on what we are seeing we may need to switch to a collagen type dressing endoform may actually be excellent for him. 05/08/2019 upon evaluation today patient's wound actually appears to be showing better granulation tissue in the base of the wound. With that being said he does have some epiboly noted around the edges of the wound especially plantar and more distal. Subsequently this is can require sharp debridement today. Fortunately there is no signs  of active systemic infection though I do feel like there  is some local cellulitis noted at this point. The patient also notes has had increased drainage. 5/13; wound volume quite a bit improved this week down to 3 mm. Patient states pain and drainage better. Culture from last week grew staph aureus [not MRSA] and this is sensitive to quinolones and he is on Levaquin. HOWEVER he also grew abundant Enterococcus faecalis treatment of choice for this is ampicillin. Quinolone coverage is unreliable 5/20 completing the Augmentin. Small wound on the right lateral foot. Thick rolled edges around the wound. Using silver alginate 05/29/2019 upon evaluation today patient appears to be doing better compared to last time I saw him. Fortunately there is no signs of active infection and I do feel like he is actually making good progress. Overall the quality of the wound bed along with the size looks improved and the inflammation/erythema that was previously noted by myself when I saw the couple weeks back actually has also resolved and this is great. Overall very pleased with how things stand. 06/06/2019 upon evaluation today patient actually appears to be doing quite well with regard to his foot ulcer with regard to some of the granulation were seen at this point. There does not appear to be any evidence of systemic or local infection although he still has discomfort I feel like things are moving in a slow but surely correct direction. I think we may need to help fill some of the space however even though he is using the collagen we may need to have something behind this such as a plain packing strip to try to help hold the collagen in the base of the wound. 06/12/2019 upon evaluation today patient appears to be doing well with regard to his foot ulcer. This seems to be making some slow but steady progress. The wound does not appear to be as deep today compared to where it was previous. Fortunately there is no signs of active infection at NATHIN, SARAN.  ([id number]) this time. No fevers, chills, nausea, vomiting, or diarrhea. 06/19/2019 on evaluation today patient's wound does not appear to be doing quite as well as what I would like to see. Fortunately there is no signs of systemic infection at this time which is great news. With that being said I do believe that the patient needs to have a wound culture as I do believe he likely has a local infection that needs to be addressed. He is also can require some debridement today as well. 06/26/2019 upon evaluation today patient's wound actually appears to be showing signs of excellent improvement today. There does not appear to be any evidence of active infection which is great news. There does not appear to be significant erythema at this point. The patient does have improvement overall in the appearance of the infection he has been taking the Augmentin the culture came back showing no organism was grown at this point but nonetheless I do believe he has been doing well with the antibiotics I would continue that at this point. 07/03/2019 upon evaluation today patient appears to be doing about the same in regard to his wound. There does not appear to be any evidence of active infection and overall I am pleased with where things stand. The patient is tolerating the dressing changes without complication but it does not appear that they are packing the wound at this time which I think is going to slow down his healing prospects. 07/10/2019 upon  evaluation today patient appears to be doing little better in my opinion in regard to the overall moisture collected in the base of the wound. They have been packing this with a little bit of silver alginate and that has done quite well. Overall I am pleased in that regard. Subsequently the redness does not appear to be any worse today I did actually go ahead and marked this since this can be 2 weeks between now and when I see him again I would make sure that if anything  changes or worsens he will be able to notice this and go ahead and initiate treatment with the Augmentin. 07/22/2019 patient comes in today a little bit early for evaluation secondary to the fact that he was having issues with increased drainage and concern about infection. With that being said he does appear to have developed a blister area again proximal to where the original wound opening was in although the wound bed itself actually appears to be doing somewhat better he has had increased drainage and tracking of fluid here due to this blister. Nonetheless I believe that that does need to be cleaned away today but again I am not really certain that I see a lot of evidence for infection at this point. 07/29/2019 on evaluation today patient actually appears to be doing better in regard to his foot ulcer. There is improvement compared to last week which is great news. Nonetheless he does come prepared to apply the total contact cast today which I think would be beneficial for him. He knows he is not supposed to drive he did bring his son with him today in order to drive him. 07/31/2019 upon evaluation today patient appears to be doing well with regard to his wound today. I do not see any signs of infection I do believe that he is definitely showing some signs of improvement here in the office in a couple days as far as the overall erythema and pressure getting to the wound area is concerned. With that being said I do believe that the cast has been of great benefit for him. 08/08/2019 upon evaluation today patient appears to be doing better in regard to his wound. Overall I feel like he is showing signs of improvement. The external measurement is really not bad at all although I did remove some of the callus in order to open up the wound itself the depth is actually coming quite well and I am extremely pleased in that regard I do not think we can to see much improvement externally until we get  the internal depth filled in and that again is improved. Fortunately there is no signs of active infection at this time. No fevers, chills, nausea, vomiting, or diarrhea. 08/15/2019 on evaluation today patient actually appears to be doing well with regard to his foot ulcer. This is showing some signs of minimal improvement from the standpoint of the depth and appearance of the wound as well as the edges of the wound. It is taking its time and to be honest I do believe that he seems to be progressing well. I am happy with the cast and what it is achieving at this point. Have contemplated checking into Dermagraft for Apligraf for the patient but again he does seem to be making fairly good progress in my opinion. If it has not tremendously changed come next week I may consider one of the 2. 08/22/2019 on evaluation today patient appears to be making good progress with regard  to his wound I am still very pleased with where things stand today. Overall I think that he is making good improvements week by week and I believe that we are getting continue with the Total contact cast I will likely switch to a silver alginate dressing. 08/29/2019 on evaluation today patient appears to be doing quite well with regard to his wound. Fortunately this is measuring quite small. There is no signs of active infection at this time although he did have a little bit of a callus area just to the side of his foot where we previously noted some fluid collecting underneath that was the case today as well. I did have to remove this. With that being said overall he seems to be doing quite well. 09/05/19 on evaluation today patient appears to be doing very well with regard to his original wound which is actually very small on evaluation today. With that being said he does have a blistered area that's more proximal to where this wound is that has been a little bit more concerned. Fortunately there is no signs of active infection at  this time. I'm going to need to remove the blister tissue today. 10 09/12/2019 on evaluation today patient actually appears to be doing much better in regard to his wound. I think he is actually improved more without the cast that he was prior with the cast. Overall very pleased with where things stand today. 09/22/2019 upon evaluation today patient appears to be doing very well at this time in regard to his foot ulcer. In fact I feel like he is continue to make good progress. I see no signs right now that there is any active infection which is great news. Also see very little evidence of pressure he does have some callus buildup but again this seems to be minimal. 09/29/2019 upon evaluation today patient appears to be doing well with regard to his foot ulcer. He has been tolerating the dressing changes without complication. Fortunately there is no signs of active infection at this time. No fevers, chills, nausea, vomiting, or diarrhea. 10/28/2019 upon evaluation today patient appears to be doing a little bit more poorly than last time I saw him as last time we assumed he was completely healed. Apparently that was not the case or else this is reopened but I tend to believe that this probably has never completely closed internally though it appeared to be. He has been using the offloading felt which seems to have done well there is no signs of redness no evidence of erythema all of which is good news. Nonetheless I am concerned about the fact that he is having issues here with what appears to be a reopening of the wound. 11/04/2019 upon evaluation today patient's wound actually showed signs of fairly good improvement today which is great news the wound bed does not appear to be significantly callus covered and also does not appear to show any signs of significant infection or really any infection whatsoever. I am very pleased in this regard. In general I think that he is making good progress although this  is a lot slower than he would like to see I think we are headed back in the correct direction. He is continuing to use the offloading felt. REMMY, RIFFE ([id number]) 11/18/2019 on evaluation today patient appears to be having some callus buildup and cracking that occurred at the wound site. Subsequently actually did have to perform a fairly significant debridement on the area to clean  away some of the callus buildup he tolerated that today with some bleeding with that being said I do feel like the wound appear to be doing much better post debridement. 11/25/2019 upon evaluation today patient appears to be doing somewhat poorly in regard to his foot ulcer. Unfortunately he is having some issues with cellulitis along the lateral aspect of his foot. Unfortunately he seems to be having some issues today with infection/cellulitis in regard to the foot. It still towards the proximal/dorsal surface of where the wound is and this subsequently is the area that always seems to cause him trouble. 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 I do believe we may need to look into this further to ensure that there is nothing more significant going on considering that he seems to have an ongoing issue with this at this point. 12/02/2019 upon evaluation today patient appears to be doing better in regard to his foot ulcer. This is great news. Fortunately there is no signs of active infection systemically which is also excellent. He has been tolerating the dressing changes without complication. With that being said he does tell me that the Augmentin has been causing him some trouble as far as headaches are concerned since he is been taking it. Is unsure if it is directly related or not. Nonetheless as I explained to him it is possible that this could be something that he is experiencing as a result of the antibiotic. The good news is there are other options that we could switch him to that would work equally  well while at the same time seeing if indeed the antibiotic was causing the headaches. He does want to proceed with that. Objective Constitutional Well-nourished and well-hydrated in no acute distress. Vitals Time Taken: 9:11 AM, Height: 74 in, Weight: 308 lbs, BMI: 39.5, Temperature: 98.6 F, Pulse: 83 bpm, Respiratory Rate: 16 breaths/min, Blood Pressure: 159/78 mmHg. Respiratory normal breathing without difficulty. Psychiatric this patient is able to make decisions and demonstrates good insight into disease process. Alert and Oriented x 3. pleasant and cooperative. General Notes: On inspection patient's wound actually appears to be doing better which is great news there is no signs of active infection overall I do see a lot of new epithelial growth and granulation tissue at this time. No sharp debridement was necessary today Integumentary (Hair, Skin) Wound #4R status is Open. Original cause of wound was Gradually Appeared. The wound is located on the Right,Lateral Foot. The wound measures 0.6cm length x 1.3cm width x 0.4cm depth; 0.613cm^2 area and 0.245cm^3 volume. There is Fat Layer (Subcutaneous Tissue) exposed. There is no tunneling or undermining noted. There is a small amount of serosanguineous drainage noted. The wound margin is flat and intact. There is medium (34-66%) red, pink granulation within the wound bed. There is a medium (34-66%) amount of necrotic tissue within the wound bed including Adherent Slough. Assessment Active Problems ICD-10 Type 2 diabetes mellitus with foot ulcer 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d Cellulitis of right lower limb Corns and callosities Essential (primary) hypertension Acquired absence of other left toe(s) Plan Wound Cleansing: XANE, AMSDEN ([id number]) Wound #4R Right,Lateral Foot: Clean wound with Normal Saline. - in office Dial antibacterial soap, wash wounds, rinse and pat dry prior  to dressing wounds Anesthetic (add to Medication List): Wound #4R Right,Lateral Foot: Topical Lidocaine 4% cream applied to wound bed prior to debridement (In Clinic Only). Primary Wound Dressing: Wound #4R Right,Lateral Foot:  Silver Alginate Secondary Dressing: Wound #4R Right,Lateral Foot: Conform/Kerlix - Offloading felt donut, dry gauze, roll gauze to cover Dressing Change Frequency: Wound #4R Right,Lateral Foot: Change Dressing Monday, Wednesday, Friday Follow-up Appointments: Wound #4R Right,Lateral Foot: Return Appointment in 1 week. Off-Loading: Wound #4R Right,Lateral Foot: Open toe surgical shoe with peg assist. - keep pressure off foot as much as possible. Medications-please add to medication list.: Wound #4R Right,Lateral Foot: P.O. Antibiotics Radiology ordered were: X-ray, foot - 3-view right The following medication(s) was prescribed: doxycycline hyclate oral 100 mg capsule 1 1 capsule oral taken 2 times per day for 14 days starting 12/02/2019 1. I would recommend at this point that we going continue with the wound care measures as before and the patient is in agreement with that plan. This includes the use of a silver alginate dressing we will discontinue the gentamicin. 2. I am also can recommend at this time that we have the patient continue to use offloading felt in order to keep pressure off the area. 3. I am also can recommend switching him to a different antibiotic. I am going to switch him to doxycycline at this point as there is no interaction between this and his current medication regimen. 4. I am going to obtain a repeat x-ray of his foot since I am having some trouble getting the MRI approved. Again I think the MRI is more sensitive for what I am concerned about which is specifically the possibility of osteomyelitis which the x-ray may not show any way. Nonetheless I will do this and then submit the updated information to the insurance company for  approval. Again I am concerned about the fact that this is a recurrent infection not do something this happen once or twice. We will see patient back for reevaluation in 1 week here in the clinic. If anything worsens or changes patient will contact our office for additional recommendations. Electronic Signature(s) Signed: 12/02/2019 10:02:37 AM By: [name] PA-C Entered By: [name] on 12/02/2019 10:02:37 [name] ([id number]) -------------------------------------------------------------------------------- SuperBill Details Patient Name: [name] Date of Service: 12/02/2019 Medical Record Number: [id number] Patient Account Number: [id number] Date of Birth/Sex: Jul 04, 1965 (54 y.o. M) Treating RN: Cornell Barman Primary Care Provider: Berneta Sages Other Clinician: Referring Provider: Berneta Sages Treating Provider/Extender: Skipper Cliche in Treatment: 37 Diagnosis Coding ICD-10 Codes Code Description E11.621 Type 2 diabetes mellitus with foot ulcer L97.512 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d L03.115 Cellulitis of right lower limb L84 Corns and callosities I10 Essential (primary) hypertension Z89.422 Acquired absence of other left toe(s) Facility Procedures CPT4 Code: 00349179 Description: 99213 - WOUND CARE VISIT-LEV 3 EST PT Modifier: Quantity: 1 Physician Procedures CPT4 Code: 1505697 Description: 99213 - WC PHYS LEVEL 3 - EST PT Modifier: Quantity: 1 CPT4 Code: Description: ICD-10 Diagnosis Description E11.621 Type 2 diabetes mellitus with foot ulcer L97.512 Non-pressure chronic ulcer of other part of right foot with fat layer e L03.115 Cellulitis of right lower limb L84 Corns and callosities Modifier: xposed Quantity: Electronic Signature(s) Signed: 12/02/2019 10:02:54 AM By: [name] PA-C Entered By: [name] on 12/02/2019 10:02:53

## 2019-12-04 ENCOUNTER — Emergency Department (HOSPITAL_COMMUNITY)
Admission: EM | Admit: 2019-12-04 | Discharge: 2019-12-04 | Disposition: A | Discharge status: Home / Self Care | Payer: Managed Care, Other (non HMO) | Attending: Emergency Medicine | Admitting: Emergency Medicine

## 2019-12-04 ENCOUNTER — Encounter (HOSPITAL_COMMUNITY): Payer: Self-pay | Admitting: Emergency Medicine

## 2019-12-04 ENCOUNTER — Emergency Department (HOSPITAL_COMMUNITY): Payer: Managed Care, Other (non HMO)

## 2019-12-04 DIAGNOSIS — R112 Nausea with vomiting, unspecified: Secondary | ICD-10-CM | POA: Diagnosis not present

## 2019-12-04 DIAGNOSIS — Z7984 Long term (current) use of oral hypoglycemic drugs: Secondary | ICD-10-CM | POA: Insufficient documentation

## 2019-12-04 DIAGNOSIS — Z79899 Other long term (current) drug therapy: Secondary | ICD-10-CM | POA: Insufficient documentation

## 2019-12-04 DIAGNOSIS — E11319 Type 2 diabetes mellitus with unspecified diabetic retinopathy without macular edema: Secondary | ICD-10-CM | POA: Insufficient documentation

## 2019-12-04 DIAGNOSIS — I10 Essential (primary) hypertension: Secondary | ICD-10-CM | POA: Diagnosis not present

## 2019-12-04 DIAGNOSIS — Z794 Long term (current) use of insulin: Secondary | ICD-10-CM | POA: Insufficient documentation

## 2019-12-04 DIAGNOSIS — H538 Other visual disturbances: Secondary | ICD-10-CM | POA: Insufficient documentation

## 2019-12-04 DIAGNOSIS — R519 Headache, unspecified: Secondary | ICD-10-CM | POA: Insufficient documentation

## 2019-12-04 LAB — COMPREHENSIVE METABOLIC PANEL
ALT: 19 U/L (ref 0–44)
AST: 18 U/L (ref 15–41)
Albumin: 3.6 g/dL (ref 3.5–5.0)
Alkaline Phosphatase: 43 U/L (ref 38–126)
Anion gap: 12 (ref 5–15)
BUN: 23 mg/dL — ABNORMAL HIGH (ref 6–20)
CO2: 23 mmol/L (ref 22–32)
Calcium: 8.9 mg/dL (ref 8.9–10.3)
Chloride: 101 mmol/L (ref 98–111)
Creatinine, Ser: 1.08 mg/dL (ref 0.61–1.24)
GFR, Estimated: >60 mL/min (ref >60)
Glucose, Bld: 165 mg/dL — ABNORMAL HIGH (ref 70–99)
Potassium: 4 mmol/L (ref 3.5–5.1)
Sodium: 136 mmol/L (ref 135–145)
Total Bilirubin: 0.5 mg/dL (ref 0.3–1.2)
Total Protein: 7 g/dL (ref 6.5–8.1)

## 2019-12-04 LAB — CBG MONITORING, ED: Glucose-Capillary: 131 mg/dL — ABNORMAL HIGH (ref 70–99)

## 2019-12-04 LAB — CBC WITH DIFFERENTIAL/PLATELET
Abs Immature Granulocytes: 0.02 10*3/uL (ref 0.00–0.07)
Basophils Absolute: 0.1 10*3/uL (ref 0.0–0.1)
Basophils Relative: 1 %
Eosinophils Absolute: 0.1 10*3/uL (ref 0.0–0.5)
Eosinophils Relative: 2 %
HCT: 45.3 % (ref 39.0–52.0)
Hemoglobin: 14.4 g/dL (ref 13.0–17.0)
Immature Granulocytes: 0 %
Lymphocytes Relative: 23 %
Lymphs Abs: 2.2 10*3/uL (ref 0.7–4.0)
MCH: 27 pg (ref 26.0–34.0)
MCHC: 31.8 g/dL (ref 30.0–36.0)
MCV: 84.8 fL (ref 80.0–100.0)
Monocytes Absolute: 0.6 10*3/uL (ref 0.1–1.0)
Monocytes Relative: 6 %
Neutro Abs: 6.4 10*3/uL (ref 1.7–7.7)
Neutrophils Relative %: 68 %
Platelets: 282 10*3/uL (ref 150–400)
RBC: 5.34 MIL/uL (ref 4.22–5.81)
RDW: 15.2 % (ref 11.5–15.5)
WBC: 9.4 10*3/uL (ref 4.0–10.5)
nRBC: 0 % (ref 0.0–0.2)

## 2019-12-04 MED ORDER — IOHEXOL 350 MG/ML SOLN
80.0000 mL | Freq: Once | INTRAVENOUS | Status: AC | PRN
  Administered 2019-12-04: 80 mL via INTRAVENOUS

## 2019-12-04 MED ORDER — FENTANYL CITRATE (PF) 100 MCG/2ML IJ SOLN
50.0000 ug | Freq: Once | INTRAMUSCULAR | Status: AC
  Administered 2019-12-04: 50 ug via INTRAVENOUS
  Filled 2019-12-04: qty 2

## 2019-12-04 MED ORDER — SODIUM CHLORIDE 0.9 % IV SOLN
25.0000 mg | Freq: Once | INTRAVENOUS | Status: AC
  Administered 2019-12-04: 25 mg via INTRAVENOUS
  Filled 2019-12-04 (×3): qty 0.5

## 2019-12-04 MED ORDER — PROCHLORPERAZINE EDISYLATE 10 MG/2ML IJ SOLN
10.0000 mg | Freq: Once | INTRAMUSCULAR | Status: AC
  Administered 2019-12-04: 10 mg via INTRAVENOUS
  Filled 2019-12-04: qty 2

## 2019-12-04 MED ORDER — SODIUM CHLORIDE 0.9 % IV BOLUS
1000.0000 mL | Freq: Once | INTRAVENOUS | Status: AC
  Administered 2019-12-04: 1000 mL via INTRAVENOUS

## 2019-12-04 MED ORDER — DEXAMETHASONE SODIUM PHOSPHATE 10 MG/ML IJ SOLN
10.0000 mg | Freq: Once | INTRAMUSCULAR | Status: AC
  Administered 2019-12-04: 10 mg via INTRAVENOUS
  Filled 2019-12-04: qty 1

## 2019-12-04 NOTE — Progress Notes (Shared)
[facility] Note  12/09/2019     CHIEF COMPLAINT Patient presents for No chief complaint on file.   HISTORY OF PRESENT ILLNESS: [name] is a 54 y.o. male who presents to the clinic today for:     Referring physician: [place], Heather OD [address] [place], [place] [address]  HISTORICAL INFORMATION:   Selected notes from the MEDICAL RECORD NUMBER Referred by Dr. [place] LEE: 06.02.2021 BCVA OD: 20/40+2 OS: 20/30 Ocular Hx-laser with Dr. Baird Cancer for DR 2015 PMH-DM, HTN, cholesterol   CURRENT MEDICATIONS: No current outpatient medications on file. (Ophthalmic Drugs)   No current facility-administered medications for this visit. (Ophthalmic Drugs)   Current Outpatient Medications (Other)  Medication Sig  . Continuous Blood Gluc Sensor (FREESTYLE LIBRE 14 DAY SENSOR) MISC SMARTSIG:1 Topical Every 2 Weeks  . empagliflozin (JARDIANCE) 25 MG TABS tablet Take 25 mg by mouth daily.  . fluticasone (FLONASE ALLERGY RELIEF) 50 MCG/ACT nasal spray Place 2 sprays into both nostrils daily as needed for allergies or rhinitis.  Marland Kitchen glimepiride (AMARYL) 2 MG tablet Take 2 mg by mouth 2 (two) times daily.  . Insulin Degludec (TRESIBA FLEXTOUCH) 200 UNIT/ML SOPN Inject 100 Units into the skin daily before breakfast.   . levofloxacin (LEVAQUIN) 500 MG tablet Take 500 mg by mouth daily.  . metFORMIN (GLUCOPHAGE) 1000 MG tablet Take 1,000 mg by mouth 2 (two) times daily.  . Multiple Vitamin (MULTIVITAMIN) tablet Take 1 tablet by mouth daily.  . Olmesartan-Amlodipine-HCTZ 40-10-25 MG TABS Take 1 tablet by mouth daily. Reported on 02/04/2015  . omega-3 acid ethyl esters (LOVAZA) 1 g capsule Take 1 capsule by mouth 2 (two) times daily.  . rosuvastatin (CRESTOR) 10 MG tablet Take 10 mg by mouth daily.   No current facility-administered medications for this visit. (Other)      REVIEW OF SYSTEMS:    ALLERGIES No Known Allergies  PAST MEDICAL  HISTORY Past Medical History:  Diagnosis Date  . Broken ankle   . Broken arm   . Diabetes mellitus without complication ([place])    type 2  . Diabetic retinopathy ([place])    PDR OU  . History of Bell's palsy   . Hyperlipidemia   . Hypertension   . Osteomyelitis ([place])    foot   Past Surgical History:  Procedure Laterality Date  . AMPUTATION Right 03/04/2015   Procedure: PARTIAL AMPUTATION RIGHT 5TH METATARSAL;  Surgeon: Landis Martins, DPM;  Location: [facility];  Service: Podiatry;  Laterality: Right;  . APPENDECTOMY    . COLONOSCOPY  11/17/2005   TAs - [place]  . COLONOSCOPY N/A 01/25/2019   Procedure: COLONOSCOPY;  Surgeon: Juanita Craver, MD;  Location: [facility] ENDOSCOPY;  Service: Endoscopy;  Laterality: N/A;  . EYE SURGERY Bilateral    blood vessels -cautery  . HEMOSTASIS CLIP PLACEMENT  01/25/2019   Procedure: HEMOSTASIS CLIP PLACEMENT;  Surgeon: Juanita Craver, MD;  Location: [facility] ENDOSCOPY;  Service: Endoscopy;;  . I & D EXTREMITY Right 03/04/2015   Procedure: IRRIGATION AND DEBRIDEMENT RIGHT FOOT;  Surgeon: Landis Martins, DPM;  Location: [facility];  Service: Podiatry;  Laterality: Right;  . WISDOM TOOTH EXTRACTION     only 2 ext    FAMILY HISTORY Family History  Problem Relation Age of Onset  . Diabetes Father   . Colon cancer Father   . Colon polyps Father   . Esophageal cancer Neg Hx   . Rectal cancer Neg Hx   . Stomach  cancer Neg Hx     SOCIAL HISTORY Social History   Tobacco Use  . Smoking status: Never Smoker  . Smokeless tobacco: Never Used  Vaping Use  . Vaping Use: Never used  Substance Use Topics  . Alcohol use: No  . Drug use: No         OPHTHALMIC EXAM:  Not recorded     IMAGING AND PROCEDURES  Imaging and Procedures for 12/09/2019           ASSESSMENT/PLAN:  No diagnosis found.  1,2. 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 Baird Cancer -- pt has not been seen at [facility] since 2015  - history of  extensive PRP OU and PPV OU ~2011-2015 - The incidence, risk factors for progression, natural history and treatment options for diabetic retinopathy were discussed with patient.   - The need for close monitoring of blood glucose, blood pressure, and serum lipids, avoiding cigarette or any type of tobacco, and the need for long term follow up was also discussed with patient. - exam shows extensive PRP and no active NV, minimal retinopathy - FA today (07.14.21) shows late-leaking MA, vascular nonperfusion, no NV OU - OCT without diabetic macular edema, both eyes  - discussed findings and prognosis - f/u in 4 months, DFE, OCT  3,4. Hypertensive retinopathy OU - discussed importance of tight BP control - monitor  5. Mixed Cataract OU - The symptoms of cataract, surgical options, and treatments and risks were discussed with patient. - discussed diagnosis and progression - not yet visually significant - monitor for now  Ophthalmic Meds Ordered this visit:  No orders of the defined types were placed in this encounter.      No follow-ups on file.  There are no Patient Instructions on file for this visit.   Explained the diagnoses, plan, and follow up with the patient and they expressed understanding.  Patient expressed understanding of the importance of proper follow up care.   This document serves as a record of services personally performed by Gardiner Sleeper, MD, PhD. It was created on their behalf by Estill Bakes, COT an ophthalmic technician. The creation of this record is the provider's dictation and/or activities during the visit.    Electronically signed by: Estill Bakes, COT 12.2.21 @ 10:47 AM  Abbreviations: M myopia (nearsighted); A astigmatism; H hyperopia (farsighted); P presbyopia; Mrx spectacle prescription;  CTL contact lenses; OD right eye; OS left eye; OU both eyes  XT exotropia; ET esotropia; PEK punctate epithelial keratitis; PEE punctate epithelial erosions; DES  dry eye syndrome; MGD meibomian gland dysfunction; ATs artificial tears; PFAT's preservative free artificial tears; [place] nuclear sclerotic cataract; PSC posterior subcapsular cataract; ERM epi-retinal membrane; PVD posterior vitreous detachment; RD retinal detachment; DM diabetes mellitus; DR diabetic retinopathy; NPDR non-proliferative diabetic retinopathy; PDR proliferative diabetic retinopathy; CSME clinically significant macular edema; DME diabetic macular edema; dbh dot blot hemorrhages; CWS cotton wool spot; POAG primary open angle glaucoma; C/D cup-to-disc ratio; HVF humphrey visual field; GVF goldmann visual field; OCT optical coherence tomography; IOP intraocular pressure; BRVO Branch retinal vein occlusion; CRVO central retinal vein occlusion; CRAO central retinal artery occlusion; BRAO branch retinal artery occlusion; RT retinal tear; SB scleral buckle; PPV pars plana vitrectomy; VH Vitreous hemorrhage; PRP panretinal laser photocoagulation; IVK intravitreal kenalog; VMT vitreomacular traction; MH Macular hole;  NVD neovascularization of the disc; NVE neovascularization elsewhere; AREDS age related eye disease study; ARMD age related macular degeneration; POAG  primary open angle glaucoma; EBMD epithelial/anterior basement membrane dystrophy; ACIOL anterior chamber intraocular lens; IOL intraocular lens; PCIOL posterior chamber intraocular lens; Phaco/IOL phacoemulsification with intraocular lens placement; [place] photorefractive keratectomy; LASIK laser assisted in situ keratomileusis; HTN hypertension; DM diabetes mellitus; COPD chronic obstructive pulmonary disease

## 2019-12-04 NOTE — ED Triage Notes (Signed)
Pt reports L sided headache x1.5 weeks, saw pcp a few days ago who gave him some medication but states headache has not improved, woke up this morning with blurred vision to L eye. LSN 10 pm last night. Speech clear, face symmetrical, a/ox4, moves all limbs equally.

## 2019-12-04 NOTE — ED Provider Notes (Signed)
[facility] EMERGENCY DEPARTMENT Provider Note   CSN: [id number] Arrival date & time: 12/04/19  9675     History Chief Complaint  Patient presents with  . Headache    [name] is a 54 y.o. male past with history of diabetes, diabetic retinopathy, hyperlipidemia, hypertension who presents for evaluation of headache and left eye blurry vision.  Reports about 10 days ago, he started having a left-sided headache.  He states that he does not recall what he was doing but denies any preceding trauma, injury.  No thunderclap headache.  He states that headache started off mild and then gradually worsened.  He saw his primary care doctor who prescribed him muscle relaxers and pain medication.  He has been taking those with no improvement.  Patient reports that he continued to have headache and states that he feels like it is gotten worse.  He describes headache on the left side only and states that he feels like it is a pressure behind his eye.  He states that he has had some nausea.  Today was the first time he had an episode of vomiting.  He also reports that this morning when he woke up, he started having some blurry vision in his left eye.  He states that he feels like when he looks with both eyes, everything is not in focus.  When he looks with just his right eye, he can see normally.  When he looks with just his left eye, he feels like everything is blurry.  He denies any chest pain, difficulty breathing, numbness/weakness.   The history is provided by the patient.       Past Medical History:  Diagnosis Date  . Broken ankle   . Broken arm   . Diabetes mellitus without complication ([place])    type 2  . Diabetic retinopathy ([place])    PDR OU  . History of Bell's palsy   . Hyperlipidemia   . Hypertension   . Osteomyelitis [facility])    foot    Patient Active Problem List   Diagnosis Date Noted  . Hypersomnia with sleep apnea 04/08/2019  . Irregular heart rhythm  04/08/2019  . Orthostatic dizziness 04/08/2019  . Lower GI bleed 04/08/2019  . Iron deficiency anemia due to chronic blood loss 04/08/2019  . 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 Morbid obesity ([place]) 04/08/2019  . Rectal bleeding 01/25/2019  . GI bleed 01/25/2019  . Hypertension   . Diabetes mellitus without complication ([place])   . Influenza A 03/09/2015  . Fever, unspecified 03/08/2015  . Nausea and vomiting 03/08/2015  . Hyperlipidemia 03/04/2015  . Osteomyelitis ([place]) 03/04/2015  . Edema leg 02/26/2015  . Pain of right lower leg 02/26/2015  . 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[place]) 02/19/2015  . Diabetes mellitus ([place]) 02/10/2015  . Foot osteomyelitis, right ([place]) 02/04/2015  . Type II diabetes mellitus ([place]) 07/10/2007  . HYPERTENSION 07/10/2007    Past Surgical History:  Procedure Laterality Date  . AMPUTATION Right 03/04/2015   Procedure: PARTIAL AMPUTATION RIGHT 5TH METATARSAL;  Surgeon: Landis Martins, DPM;  Location: [place];  Service: Podiatry;  Laterality: Right;  . APPENDECTOMY    . COLONOSCOPY  11/17/2005   TAs - [place]  . COLONOSCOPY N/A 01/25/2019   Procedure: COLONOSCOPY;  Surgeon: Juanita Craver, MD;  Location: [facility] ENDOSCOPY;  Service: Endoscopy;  Laterality: N/A;  . EYE SURGERY Bilateral    blood vessels -cautery  . HEMOSTASIS CLIP  PLACEMENT  01/25/2019   Procedure: HEMOSTASIS CLIP PLACEMENT;  Surgeon: Juanita Craver, MD;  Location: [facility] ENDOSCOPY;  Service: Endoscopy;;  . I & D EXTREMITY Right 03/04/2015   Procedure: IRRIGATION AND DEBRIDEMENT RIGHT FOOT;  Surgeon: Landis Martins, DPM;  Location: [place];  Service: Podiatry;  Laterality: Right;  . WISDOM TOOTH EXTRACTION     only 2 ext       Family History  Problem Relation Age of Onset  . Diabetes Father   . Colon cancer Father   . Colon polyps Father   . Esophageal cancer Neg Hx   . Rectal cancer Neg Hx   . Stomach cancer Neg Hx     Social History   Tobacco Use  .  Smoking status: Never Smoker  . Smokeless tobacco: Never Used  Vaping Use  . Vaping Use: Never used  Substance Use Topics  . Alcohol use: No  . Drug use: No    Home Medications Prior to Admission medications   Medication Sig Start Date End Date Taking? Authorizing Provider  Continuous Blood Gluc Sensor (FREESTYLE LIBRE 14 DAY SENSOR) MISC SMARTSIG:1 Topical Every 2 Weeks 06/20/19   [provider]  empagliflozin (JARDIANCE) 25 MG TABS tablet Take 25 mg by mouth daily.    [provider]  fluticasone (FLONASE ALLERGY RELIEF) 50 MCG/ACT nasal spray Place 2 sprays into both nostrils daily as needed for allergies or rhinitis.    [provider]  glimepiride (AMARYL) 2 MG tablet Take 2 mg by mouth 2 (two) times daily. 10/26/14   [provider]  Insulin Degludec (TRESIBA FLEXTOUCH) 200 UNIT/ML SOPN Inject 100 Units into the skin daily before breakfast.     [provider]  levofloxacin (LEVAQUIN) 500 MG tablet Take 500 mg by mouth daily. 05/08/19   [provider]  metFORMIN (GLUCOPHAGE) 1000 MG tablet Take 1,000 mg by mouth 2 (two) times daily. 11/09/14   [provider]  Multiple Vitamin (MULTIVITAMIN) tablet Take 1 tablet by mouth daily.    [provider]  Olmesartan-Amlodipine-HCTZ 40-10-25 MG TABS Take 1 tablet by mouth daily. Reported on 02/04/2015 12/28/14   [provider]  omega-3 acid ethyl esters (LOVAZA) 1 g capsule Take 1 capsule by mouth 2 (two) times daily.    [provider]  rosuvastatin (CRESTOR) 10 MG tablet Take 10 mg by mouth daily. 01/03/19   [provider]    Allergies    Patient has no known allergies.  Review of Systems   Review of Systems  Constitutional: Negative for fever.  Eyes: Positive for visual disturbance.  Respiratory: Negative for cough and shortness of breath.   Cardiovascular: Negative for chest pain.  Gastrointestinal: Negative for abdominal pain, nausea and  vomiting.  Genitourinary: Negative for dysuria and hematuria.  Neurological: Positive for headaches. Negative for weakness and numbness.  All other systems reviewed and are negative.   Physical Exam Updated Vital Signs BP (!) 142/62   Pulse 68   Temp 98.2 F (36.8 C) (Oral)   Resp 16   Ht 6\' 2"  (1.88 m)   Wt 133.8 kg   SpO2 98%   BMI 37.88 kg/m   Physical Exam Vitals and nursing note reviewed.  Constitutional:      Appearance: Normal appearance. He is well-developed.  HENT:     Head: Normocephalic and atraumatic.  Eyes:     General: Lids are normal.     Conjunctiva/sclera: Conjunctivae normal.     Pupils: Pupils are equal,  round, and reactive to light.     Comments: PERRL. EOMs intact. No nystagmus. No neglect.   Cardiovascular:     Rate and Rhythm: Normal rate and regular rhythm.     Pulses: Normal pulses.     Heart sounds: Normal heart sounds. No murmur heard.  No friction rub. No gallop.   Pulmonary:     Effort: Pulmonary effort is normal.     Breath sounds: Normal breath sounds.  Abdominal:     Palpations: Abdomen is soft. Abdomen is not rigid.     Tenderness: There is no abdominal tenderness. There is no guarding.     Comments: Abdomen is soft, non-distended, non-tender. No rigidity, No guarding. No peritoneal signs.  Musculoskeletal:        General: Normal range of motion.     Cervical back: Full passive range of motion without pain.  Skin:    General: Skin is warm and dry.     Capillary Refill: Capillary refill takes less than 2 seconds.  Neurological:     Mental Status: He is alert and oriented to person, place, and time.     Comments: Cranial nerves III-XII intact Follows commands, Moves all extremities  5/5 strength to BUE and BLE  Sensation intact throughout all major nerve distributions Normal finger to nose.  No slurred speech. No facial droop.   Psychiatric:        Speech: Speech normal.     ED Results / Procedures / Treatments   Labs (all  labs ordered are listed, but only abnormal results are displayed) Labs Reviewed  COMPREHENSIVE METABOLIC PANEL - Abnormal; Notable for the following components:      Result Value   Glucose, Bld 165 (*)    BUN 23 (*)    All other components within normal limits  CBG MONITORING, ED - Abnormal; Notable for the following components:   Glucose-Capillary 131 (*)    All other components within normal limits  CBC WITH DIFFERENTIAL/PLATELET    EKG None  Radiology No results found.  Procedures Procedures (including critical care time)  Medications Ordered in ED Medications  diphenhydrAMINE (BENADRYL) 25 mg in sodium chloride 0.9 % 50 mL IVPB (has no administration in time range)  sodium chloride 0.9 % bolus 1,000 mL (1,000 mLs Intravenous New Bag/Given 12/04/19 1452)  prochlorperazine (COMPAZINE) injection 10 mg (10 mg Intravenous Given 12/04/19 1453)    ED Course  I have reviewed the triage vital signs and the nursing notes.  Pertinent labs & imaging results that were available during my care of the patient were reviewed by me and considered in my medical decision making (see chart for details).    MDM Rules/Calculators/A&P                          54 year old male who presents for evaluation of headache, visual disturbance, nausea/vomiting.  Reports he for started having headache about 10 days ago.  Saw his primary care doctor who evaluated him given muscle relaxers, pain medication for his headache.  States that despite that, he is continued have headache.  He does not get migraines frequently does not get headaches frequently.  This morning, he states that he felt some blurry vision in his left eye.  He states he has had some nausea and vomiting as well.  On initial arrival, he is afebrile, nontoxic-appearing.  Vital signs are stable.  On exam, no neuro deficits noted.  We will plan for labs,  CT head to rule out any intracranial mass.  Will most likely need MRI.  CMP shows BUN of 23,  creatinine 1.08.  CBC shows no leukocytosis or anemia.  Patient signed out to Dr. Ronnald Nian pending imaging of head.   Portions of this note were generated with [employer]. Dictation errors may occur despite best attempts at proofreading.   Final Clinical Impression(s) / ED Diagnoses Final diagnoses:  Blurry vision  Acute nonintractable headache, unspecified headache type    Rx / DC Orders ED Discharge Orders    None       Volanda Napoleon, PA-C 12/04/19 [place], DO 12/04/19 1512

## 2019-12-04 NOTE — ED Provider Notes (Signed)
Patient with headache on and off for 10 days.  Neurologically he is intact.  No visual field deficit, overall normal visual acuity with glasses on.  Does not have any active blurred vision at this time.  CTA of his head and neck was overall unremarkable.  No head bleed.  No major stenosis.  Headache has improved greatly following headache cocktail.  Recommend follow-up with primary care doctor/neurology.  Understands return precautions.  No concern for stroke.  Suspect migraine/sinus headaache.  This chart was dictated using voice recognition software.  Despite best efforts to proofread,  errors can occur which can change the documentation meaning.     Lennice Sites, DO 12/04/19 1831

## 2019-12-05 NOTE — Progress Notes (Signed)
SADARIUS, NORMAN ([id number]) Visit Report for 12/02/2019 Arrival Information Details Patient Name: [name]. Date of Service: 12/02/2019 9:00 AM Medical Record Number: [id number] Patient Account Number: [id number] Date of Birth/Sex: 26-Apr-1965 (54 y.o. M) Treating RN: Dolan Amen Primary Care Delynn Pursley: Berneta Sages Other Clinician: Referring Aayushi Solorzano: Berneta Sages Treating Melville Engen/Extender: Skipper Cliche in Treatment: 37 Visit Information History Since Last Visit Pain Present Now: No Patient Arrived: Ambulatory Arrival Time: 09:11 Accompanied By: self Transfer Assistance: None Patient Identification Verified: Yes Secondary Verification Process Completed: Yes Patient Requires Transmission-Based Precautions: No Patient Has Alerts: Yes Patient Alerts: DMII Electronic Signature(s) Signed: 12/02/2019 5:11:54 PM By: Georges Mouse, Minus Breeding Entered By: Georges Mouse, Minus Breeding on 12/02/2019 09:11:31 [name] ([id number]) -------------------------------------------------------------------------------- Clinic Level of Care Assessment Details Patient Name: [name] Date of Service: 12/02/2019 9:00 AM Medical Record Number: [id number] Patient Account Number: [id number] Date of Birth/Sex: May 11, 1965 (54 y.o. M) Treating RN: Cornell Barman Primary Care Kilo Eshelman: Berneta Sages Other Clinician: Referring Jadia Capers: Berneta Sages Treating Bricyn Labrada/Extender: Skipper Cliche in Treatment: 37 Clinic Level of Care Assessment Items TOOL 4 Quantity Score []  - Use when only an EandM is performed on FOLLOW-UP visit 0 ASSESSMENTS - Nursing Assessment / Reassessment X - Reassessment of Co-morbidities (includes updates in patient status) 1 10 X- 1 5 Reassessment of Adherence to Treatment Plan ASSESSMENTS - Wound and Skin Assessment / Reassessment X - Simple Wound Assessment / Reassessment - one wound 1 5 []  - 0 Complex Wound Assessment / Reassessment - multiple  wounds []  - 0 Dermatologic / Skin Assessment (not related to wound area) ASSESSMENTS - Focused Assessment []  - Circumferential Edema Measurements - multi extremities 0 []  - 0 Nutritional Assessment / Counseling / Intervention []  - 0 Lower Extremity Assessment (monofilament, tuning fork, pulses) []  - 0 Peripheral Arterial Disease Assessment (using hand held doppler) ASSESSMENTS - Ostomy and/or Continence Assessment and Care []  - Incontinence Assessment and Management 0 []  - 0 Ostomy Care Assessment and Management (repouching, etc.) PROCESS - Coordination of Care X - Simple Patient / Family Education for ongoing care 1 15 []  - 0 Complex (extensive) Patient / Family Education for ongoing care []  - 0 Staff obtains Programmer, systems, Records, Test Results / Process Orders []  - 0 Staff telephones HHA, Nursing Homes / Clarify orders / etc []  - 0 Routine Transfer to another Facility (non-emergent condition) []  - 0 Routine Hospital Admission (non-emergent condition) []  - 0 New Admissions / Biomedical engineer / Ordering NPWT, Apligraf, etc. []  - 0 Emergency Hospital Admission (emergent condition) X- 1 10 Simple Discharge Coordination []  - 0 Complex (extensive) Discharge Coordination PROCESS - Special Needs []  - Pediatric / Minor Patient Management 0 []  - 0 Isolation Patient Management []  - 0 Hearing / Language / Visual special needs []  - 0 Assessment of Community assistance (transportation, D/C planning, etc.) []  - 0 Additional assistance / Altered mentation []  - 0 Support Surface(s) Assessment (bed, cushion, seat, etc.) INTERVENTIONS - Wound Cleansing / Measurement IZYK, MARTY. ([id number]) X- 1 5 Simple Wound Cleansing - one wound []  - 0 Complex Wound Cleansing - multiple wounds X- 1 5 Wound Imaging (photographs - any number of wounds) []  - 0 Wound Tracing (instead of photographs) X- 1 5 Simple Wound Measurement - one wound []  - 0 Complex Wound Measurement -  multiple wounds INTERVENTIONS - Wound Dressings []  - Small Wound Dressing one or multiple wounds 0 X- 1 15 Medium Wound Dressing one or multiple wounds []  - 0 Large  Wound Dressing one or multiple wounds []  - 0 Application of Medications - topical []  - 0 Application of Medications - injection INTERVENTIONS - Miscellaneous []  - External ear exam 0 []  - 0 Specimen Collection (cultures, biopsies, blood, body fluids, etc.) []  - 0 Specimen(s) / Culture(s) sent or taken to Lab for analysis []  - 0 Patient Transfer (multiple staff / Civil Service fast streamer / Similar devices) []  - 0 Simple Staple / Suture removal (25 or less) []  - 0 Complex Staple / Suture removal (26 or more) []  - 0 Hypo / Hyperglycemic Management (close monitor of Blood Glucose) []  - 0 Ankle / Brachial Index (ABI) - do not check if billed separately X- 1 5 Vital Signs Has the patient been seen at the hospital within the last three years: Yes Total Score: 80 Level Of Care: New/Established - Level 3 Electronic Signature(s) Signed: 12/05/2019 4:55:43 PM By: Gretta Cool, BSN, RN, CWS, Kim RN, BSN Entered By: Gretta Cool, BSN, RN, CWS, Kim on 12/02/2019 09:47:17 [name] ([id number]) -------------------------------------------------------------------------------- Encounter Discharge Information Details Patient Name: [name]. Date of Service: 12/02/2019 9:00 AM Medical Record Number: [id number] Patient Account Number: [id number] Date of Birth/Sex: [birth year] (54 y.o. M) Treating RN: Cornell Barman Primary Care Tylin Force: Berneta Sages Other Clinician: Referring Raymel Cull: Berneta Sages Treating Haylen Bellotti/Extender: Skipper Cliche in Treatment: 37 Encounter Discharge Information Items Discharge Condition: Stable Ambulatory Status: Ambulatory Discharge Destination: Home Transportation: Private Auto Accompanied By: self Schedule Follow-up Appointment: Yes Clinical Summary of Care: Electronic Signature(s) Signed: 12/05/2019  4:55:43 PM By: Gretta Cool, BSN, RN, CWS, Kim RN, BSN Entered By: Gretta Cool, BSN, RN, CWS, Kim on 12/02/2019 09:48:11 [name] ([id number]) -------------------------------------------------------------------------------- Lower Extremity Assessment Details Patient Name: SHIVANSH, HARDAWAY. Date of Service: 12/02/2019 9:00 AM Medical Record Number: [id number] Patient Account Number: [id number] Date of Birth/Sex: October 15, 1965 (54 y.o. M) Treating RN: Dolan Amen Primary Care Haidar Muse: Berneta Sages Other Clinician: Referring Deidrea Gaetz: Berneta Sages Treating Dashanique Brownstein/Extender: Jeri Cos Weeks in Treatment: 37 Edema Assessment Assessed: [Left: No] Patrice Paradise: Yes] Edema: [Left: N] [Right: o] Vascular Assessment Pulses: Dorsalis Pedis Palpable: [Right:Yes] Electronic Signature(s) Signed: 12/02/2019 5:11:54 PM By: Georges Mouse, Minus Breeding Entered By: Georges Mouse, Minus Breeding on 12/02/2019 09:19:48 [name] ([id number]) -------------------------------------------------------------------------------- Multi Wound Chart Details Patient Name: [name] Date of Service: 12/02/2019 9:00 AM Medical Record Number: [id number] Patient Account Number: [id number] Date of Birth/Sex: 05/20/1965 (54 y.o. M) Treating RN: Cornell Barman Primary Care Seanmichael Salmons: Berneta Sages Other Clinician: Referring Alaylah Heatherington: Berneta Sages Treating Monet North/Extender: Skipper Cliche in Treatment: 37 Vital Signs Height(in): 74 Pulse(bpm): 23 Weight(lbs): 308 Blood Pressure(mmHg): 159/78 Body Mass Index(BMI): 40 Temperature(F): 98.6 Respiratory Rate(breaths/min): 16 Photos: [N/A:N/A] Wound Location: Right, Lateral Foot N/A N/A Wounding Event: Gradually Appeared N/A N/A Primary Etiology: Diabetic Wound/Ulcer of the Lower N/A N/A Extremity Comorbid History: Hypertension, Type II Diabetes, N/A N/A Neuropathy Date Acquired: 03/12/2019 N/A N/A Weeks of Treatment: 37 N/A N/A Wound Status: Open N/A  N/A Wound Recurrence: Yes N/A N/A Pending Amputation on Yes N/A N/A Presentation: Measurements L x W x D (cm) 0.6x1.3x0.4 N/A N/A Area (cm) : 0.613 N/A N/A Volume (cm) : 0.245 N/A N/A % Reduction in Area: 3.60% N/A N/A % Reduction in Volume: -92.90% N/A N/A Classification: Grade 1 N/A N/A Exudate Amount: Small N/A N/A Exudate Type: Serosanguineous N/A N/A Exudate Color: red, brown N/A N/A Wound Margin: Flat and Intact N/A N/A Granulation Amount: Medium (34-66%) N/A N/A Granulation Quality: Red, Pink N/A N/A Necrotic Amount: Medium (34-66%) N/A N/A Exposed Structures:  Fat Layer (Subcutaneous Tissue): N/A N/A Yes Fascia: No Tendon: No Muscle: No Joint: No Bone: No Epithelialization: Medium (34-66%) N/A N/A Treatment Notes Electronic Signature(s) Signed: 12/05/2019 4:55:43 PM By: Gretta Cool, BSN, RN, CWS, Kim RN, BSN Entered By: Gretta Cool, BSN, RN, CWS, Kim on 12/02/2019 09:41:39 NARCISO, [place] ([id number]) LEYLAND, KENNA ([id number]) -------------------------------------------------------------------------------- [place] Details Patient Name: JENKINS, RISDON. Date of Service: 12/02/2019 9:00 AM Medical Record Number: [id number] Patient Account Number: [id number] Date of Birth/Sex: 03-Jan-1965 (54 y.o. M) Treating RN: Cornell Barman Primary Care Kaelin Bonelli: Berneta Sages Other Clinician: Referring Staysha Truby: Berneta Sages Treating Tanji Storrs/Extender: Skipper Cliche in Treatment: 37 Active Inactive Necrotic Tissue Nursing Diagnoses: Impaired tissue integrity related to necrotic/devitalized tissue Knowledge deficit related to management of necrotic/devitalized tissue Goals: Necrotic/devitalized tissue will be minimized in the wound bed Date Initiated: 11/25/2019 Target Resolution Date: 12/11/2019 Goal Status: Active Patient/caregiver will verbalize understanding of reason and process for debridement of necrotic tissue Date Initiated: 11/25/2019 Target  Resolution Date: 12/11/2019 Goal Status: Active Interventions: Assess patient pain level pre-, during and post procedure and prior to discharge Treatment Activities: Excisional debridement : 11/25/2019 Notes: Nutrition Nursing Diagnoses: Impaired glucose control: actual or potential Goals: Patient/caregiver verbalizes understanding of need to maintain therapeutic glucose control per primary care physician Date Initiated: 03/13/2019 Target Resolution Date: 04/11/2019 Goal Status: Active Interventions: Assess patient nutrition upon admission and as needed per policy Notes: Orientation to the Wound Care Program Nursing Diagnoses: Knowledge deficit related to the wound healing center program Goals: Patient/caregiver will verbalize understanding of the [place] Date Initiated: 03/13/2019 Target Resolution Date: 04/11/2019 Goal Status: Active Interventions: Provide education on orientation to the wound center Notes: Wound/Skin Impairment Nursing Diagnoses: ALBIE, ARIZPE ([id number]) Impaired tissue integrity Goals: Ulcer/skin breakdown will have a volume reduction of 30% by week 4 Date Initiated: 03/13/2019 Target Resolution Date: 04/11/2019 Goal Status: Active Interventions: Assess ulceration(s) every visit Notes: Electronic Signature(s) Signed: 12/05/2019 4:55:43 PM By: Gretta Cool, BSN, RN, CWS, Kim RN, BSN Entered By: Gretta Cool, BSN, RN, CWS, Kim on 12/02/2019 09:41:24 [name] ([id number]) -------------------------------------------------------------------------------- Pain Assessment Details Patient Name: [name] Date of Service: 12/02/2019 9:00 AM Medical Record Number: [id number] Patient Account Number: [id number] Date of Birth/Sex: [birth year] (54 y.o. M) Treating RN: Dolan Amen Primary Care Thelia Tanksley: Berneta Sages Other Clinician: Referring Victorious Cosio: Berneta Sages Treating Flay Ghosh/Extender: Skipper Cliche in Treatment:  37 Active Problems Location of Pain Severity and Description of Pain Patient Has Paino No Site Locations Rate the pain. Current Pain Level: 0 Pain Management and Medication Current Pain Management: Electronic Signature(s) Signed: 12/02/2019 5:11:54 PM By: Georges Mouse, Minus Breeding Entered By: Georges Mouse, Minus Breeding on 12/02/2019 09:13:18 [name] ([id number]) -------------------------------------------------------------------------------- Patient/Caregiver Education Details Patient Name: [name] Date of Service: 12/02/2019 9:00 AM Medical Record Number: [id number] Patient Account Number: [id number] Date of Birth/Gender: [birth year] (54 y.o. M) Treating RN: Cornell Barman Primary Care Physician: Berneta Sages Other Clinician: Referring Physician: Berneta Sages Treating Physician/Extender: Skipper Cliche in Treatment: 60 Education Assessment Education Provided To: Patient Education Topics Provided Wound/Skin Impairment: Handouts: Caring for Your Ulcer Methods: Demonstration, Explain/Verbal Responses: State content correctly Electronic Signature(s) Signed: 12/05/2019 4:55:43 PM By: Gretta Cool, BSN, RN, CWS, Kim RN, BSN Entered By: Gretta Cool, BSN, RN, CWS, Kim on 12/02/2019 09:47:33 [name] ([id number]) -------------------------------------------------------------------------------- Wound Assessment Details Patient Name: GARLON, TUGGLE. Date of Service: 12/02/2019 9:00 AM Medical Record Number: [id number] Patient Account Number: [id number] Date of Birth/Sex: January 12, 1965 (54 y.o. M) Treating RN:  Dolan Amen Primary Care Zymier Rodgers: Berneta Sages Other Clinician: Referring Milena Liggett: Berneta Sages Treating Lyvonne Cassell/Extender: Skipper Cliche in Treatment: 37 Wound Status Wound Number: [id number] Primary Etiology: Diabetic Wound/Ulcer of the Lower Extremity Wound Location: Right, Lateral Foot Wound Status: Open Wounding Event: Gradually Appeared Comorbid History:  Hypertension, Type II Diabetes, Neuropathy Date Acquired: 03/12/2019 Weeks Of Treatment: 37 Clustered Wound: No Pending Amputation On Presentation Photos Wound Measurements Length: (cm) 0.6 % Width: (cm) 1.3 % Depth: (cm) 0.4 Ep Area: (cm) 0.613 T Volume: (cm) 0.245 U Reduction in Area: 3.6% Reduction in Volume: -92.9% ithelialization: Medium (34-66%) unneling: No ndermining: No Wound Description Classification: Grade 1 Fo Wound Margin: Flat and Intact Sl Exudate Amount: Small Exudate Type: Serosanguineous Exudate Color: red, brown ul Odor After Cleansing: No ough/Fibrino Yes Wound Bed Granulation Amount: Medium (34-66%) Exposed Structure Granulation Quality: Red, Pink Fascia Exposed: No Necrotic Amount: Medium (34-66%) Fat Layer (Subcutaneous Tissue) Exposed: Yes Necrotic Quality: Adherent Slough Tendon Exposed: No Muscle Exposed: No Joint Exposed: No Bone Exposed: No Treatment Notes Wound #4R (Right, Lateral Foot) Notes silver alginate, dry gauze, offloading felt, conform TAKESHI, TEASDALE ([id number]) Electronic Signature(s) Signed: 12/02/2019 5:11:54 PM By: Georges Mouse, Minus Breeding Entered By: Georges Mouse, Minus Breeding on 12/02/2019 09:19:04 [name] ([id number]) -------------------------------------------------------------------------------- Vitals Details Patient Name: [name] Date of Service: 12/02/2019 9:00 AM Medical Record Number: [id number] Patient Account Number: [id number] Date of Birth/Sex: Jul 23, 1965 (54 y.o. M) Treating RN: Dolan Amen Primary Care Weiland Tomich: Berneta Sages Other Clinician: Referring Madonna Flegal: Berneta Sages Treating Karlena Luebke/Extender: Skipper Cliche in Treatment: 37 Vital Signs Time Taken: 09:11 Temperature (F): 98.6 Height (in): 74 Pulse (bpm): 83 Weight (lbs): 308 Respiratory Rate (breaths/min): 16 Body Mass Index (BMI): 39.5 Blood Pressure (mmHg): 159/78 Reference Range: 80 - 120 mg /  dl Electronic Signature(s) Signed: 12/02/2019 5:11:54 PM By: Georges Mouse, Minus Breeding Entered By: Georges Mouse, Minus Breeding on 12/02/2019 09:13:00

## 2019-12-09 ENCOUNTER — Other Ambulatory Visit: Payer: Self-pay

## 2019-12-09 ENCOUNTER — Encounter (INDEPENDENT_AMBULATORY_CARE_PROVIDER_SITE_OTHER): Payer: Managed Care, Other (non HMO) | Admitting: Ophthalmology

## 2019-12-09 ENCOUNTER — Encounter: Payer: Managed Care, Other (non HMO) | Attending: Physician Assistant | Admitting: Physician Assistant

## 2019-12-09 DIAGNOSIS — Z89422 Acquired absence of other left toe(s): Secondary | ICD-10-CM | POA: Diagnosis not present

## 2019-12-09 DIAGNOSIS — L03115 Cellulitis of right lower limb: Secondary | ICD-10-CM | POA: Diagnosis not present

## 2019-12-09 DIAGNOSIS — H3581 Retinal edema: Secondary | ICD-10-CM

## 2019-12-09 DIAGNOSIS — L84 Corns and callosities: Secondary | ICD-10-CM | POA: Diagnosis not present

## 2019-12-09 DIAGNOSIS — I10 Essential (primary) hypertension: Secondary | ICD-10-CM | POA: Insufficient documentation

## 2019-12-09 DIAGNOSIS — L97512 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d: Secondary | ICD-10-CM | POA: Diagnosis not present

## 2019-12-09 DIAGNOSIS — H25813 Combined forms of age-related cataract, bilateral: Secondary | ICD-10-CM

## 2019-12-09 DIAGNOSIS — E1140 Type 2 diabetes mellitus with diabetic neuropathy, unspecified: Secondary | ICD-10-CM | POA: Insufficient documentation

## 2019-12-09 DIAGNOSIS — H35033 Hypertensive retinopathy, bilateral: Secondary | ICD-10-CM

## 2019-12-09 DIAGNOSIS — E113553 Type 2 diabetes mellitus with stable proliferative diabetic retinopathy, bilateral: Secondary | ICD-10-CM

## 2019-12-09 DIAGNOSIS — E11621 Type 2 diabetes mellitus with foot ulcer: Secondary | ICD-10-CM | POA: Diagnosis present

## 2019-12-09 DIAGNOSIS — E785 Hyperlipidemia, unspecified: Secondary | ICD-10-CM | POA: Diagnosis not present

## 2019-12-09 NOTE — Progress Notes (Signed)
BURT, PIATEK ([id number]) Visit Report for 12/09/2019 Chief Complaint Document Details Patient Name: [name]. Date of Service: 12/09/2019 8:15 AM Medical Record Number: [id number] Patient Account Number: [id number] Date of Birth/Sex: [birth year] (54 y.o. M) Treating RN: Cornell Barman Primary Care Provider: Berneta Sages Other Clinician: Referring Provider: Berneta Sages Treating Provider/Extender: Skipper Cliche in Treatment: 65 Information Obtained from: Patient Chief Complaint Right lateral foot ulcer Electronic Signature(s) Signed: 12/09/2019 5:22:05 PM By: [name] PA-C Entered By: [name] on 12/09/2019 08:27:16 [name] ([id number]) -------------------------------------------------------------------------------- Debridement Details Patient Name: [name] Date of Service: 12/09/2019 8:15 AM Medical Record Number: [id number] Patient Account Number: [id number] Date of Birth/Sex: [birth year] (54 y.o. M) Treating RN: Dolan Amen Primary Care Provider: Berneta Sages Other Clinician: Referring Provider: Berneta Sages Treating Provider/Extender: Skipper Cliche in Treatment: 38 Debridement Performed for Wound #4R Right,Lateral Foot Assessment: Performed By: Physician Tommie Sams., PA-C Debridement Type: Debridement Severity of Tissue Pre Debridement: Fat layer exposed Level of Consciousness (Pre- Awake and Alert procedure): Pre-procedure Verification/Time Out Yes - 08:40 Taken: Start Time: 08:40 Pain Control: Lidocaine 4% Topical Solution Total Area Debrided (L x W): 0.3 (cm) x 0.4 (cm) = 0.12 (cm) Tissue and other material Viable, Non-Viable, Callus, Slough, Subcutaneous, Slough debrided: Level: Skin/Subcutaneous Tissue Debridement Description: Excisional Instrument: Curette Bleeding: Minimum Hemostasis Achieved: Pressure End Time: 08:45 Procedural Pain: 0 Post Procedural Pain: 0 Response to Treatment: Procedure was  tolerated well Level of Consciousness (Post- Awake and Alert procedure): Post Debridement Measurements of Total Wound Length: (cm) 0.3 Width: (cm) 0.4 Depth: (cm) 0.5 Volume: (cm) 0.047 Character of Wound/Ulcer Post Debridement: Stable Severity of Tissue Post Debridement: Fat layer exposed Post Procedure Diagnosis Same as Pre-procedure Electronic Signature(s) Signed: 12/09/2019 10:25:55 AM By: Charlett Nose Signed: 12/09/2019 5:22:05 PM By: [name] PA-C Entered By: Georges Mouse, Minus Breeding on 12/09/2019 08:46:07 [name] ([id number]) -------------------------------------------------------------------------------- HPI Details Patient Name: [name] Date of Service: 12/09/2019 8:15 AM Medical Record Number: [id number] Patient Account Number: [id number] Date of Birth/Sex: [birth year] (54 y.o. M) Treating RN: Cornell Barman Primary Care Provider: Berneta Sages Other Clinician: Referring Provider: Berneta Sages Treating Provider/Extender: Skipper Cliche in Treatment: 102 History of Present Illness HPI Description: This 54 year old male comes with an ulcerated area on the plantar aspect of the right foot which she's had for approximately a month. I have known him from a previous visit at [facility] wound center and was treated in the months of April and May 2016 and rapidly healed a left plantar ulcer with a total contact cast. He has been a diabetic for about 16 years and tries to keep active and is fairly compliant with his diabetes management. 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 Past medical history significant for hypertension, hyperlipidemia, and status post appendectomy 1993. He does not smoke or drink alcohol. 01/14/2015 -- the patient had tolerated his total contact cast very well and had no problems and has had no systemic symptoms. However when his total contact cast was cut open he had excessive amount of purulent drainage in spite of being  on antibiotics. He had had a recent x-ray done in the ER 12/22/2014 which showed IMPRESSION:No evidence of osseous erosion. Known soft tissue ulceration is not well characterized on radiograph. Scattered vascular calcifications seen. his last hemoglobin A1c in December was 7.3. He has been on Augmentin and doxycycline for the last 2 weeks. 01/21/2015 -- his culture grew rare growth of Pantoea species  an MR moderate growth of Candida parapsilosis. it is sensitive to levofloxacin. He has not heard back from the insurance company regarding his hyperbaric oxygen therapy. His MRI has not been done yet and we will try and get him an earlier date 01/28/2015 -- MRI was done last night -- IMPRESSION:1. Soft tissue ulcer overlying the plantar aspect of the fifth metatarsal head extending to the cortex. Subcortical marrow edema in the fifth metatarsal head with corresponding T1 hypointensity is concerning for early osteomyelitis of the plantar lateral aspect of the fifth metatarsal head. Chest x-ray done on 01/14/2015 shows bronchiectatic changes without infiltrate. EKG done on generally 17 2017 shows a normal sinus rhythm and is a normal EKG. 02/04/2015 -- he was asked to see Dr. [name] last week and had 2 appointments but had to cancel both due to pressures of work. Last night he has woken up with severe pain in the foot and leg and it is swollen up. No fever or no change in his blood glucose. Addendum: I spoke with Dr. [name] who kindly agreed to accept the patient for inpatient therapy and have also opened to the hospitalist Dr. [name], and discuss details of the management including PICC line and repeat cultures. 02/12/2015-- -- was seen by Dr. [name] in the hospital and a PICC line was placed. He was to receive Ceftazidime 2 g every 12 hourly, oral levofloxacin 750 mg every 24 hourly and oral fluconazole 200 mg daily. The antibiotics were to be given for 4 weeks except the Diflucan was to be  given for the first 2 weeks. Reviewed note from 02/10/2015 -- and Dr. [name] had recommended management for growth of MSSA and Serratia. He switched him from ceftazidime to ceftriaxone 2 g every 24 hours. Levofloxacin was stopped and he would continue on fluconazole for another week. He had asked me to decide whether further imaging was necessary and whether surgical debridement of the infected bone was needed. He is doing well and has been off work for this week and we will keep him off the next week. 02/22/2015 -- he was seen by my colleague on 01/19/2015 and at that time an incision and drainage was done on his right lateral forefoot on the dorsum. Today when I probed this wound it is frankly draining pus and it communicates with the ulcer on the plantar aspect of his right foot. The patient is still on IV ceftriaxone 2 g every 24 hours and is to be seen by Dr. [name] on Friday. 03/19/2015 -- On 03/04/2015 I spoke to Dr. 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 He recommended operative debridement which would probably result in the fifth metatarsal head and toe amputation.The patient would be referred back to [place] once he was done with surgery. He was admitted to [facility] yesterday and had surgery done by podiatry for a right fifth metatarsal acute osteomyelitis with cellulitis and abscess. He had a right foot incision and drainage with fifth metatarsal partial amputation and removal of toe infected bone and soft tissue with cultures. The wound was partially closed and packing of the distal end was done. Patient was already on cefepime 2 g IV every 8 hourly and put on vancomycin pending final cultures. He had grown moderate gram-negative rods, and later found to be rare diphtheroids. I  received a call from Dr. Cannon Kettle  the podiatrist and we discussed the above. On 03/10/2015 he was found positive for influenza a and has been put on Tamiflu. Since his discharge he has been seen by Dr. Cannon Kettle who is planning to remove his sutures this coming week. He was reviewed by Dr. [name] on 03/17/2015 and his Diflucan was stopped and vancomycin. After the 3 doses he is taking. He is going to change Ceftazidime to Zosyn 3.375 g IV every 8 hours. 03/25/2015 -- he was seen by the podiatrist a couple of days ago and the sutures were removed. She will follow back with him in 4 weeks' time and at that time x-rays will be taken and a custom molded insert would be made for his shoe. 04/01/2015 -- he was seen by Dr. [name] on 03/29/2015 who pulled the PICC line stopped his IV antibiotics and recommended starting doxycycline and levofloxacin for 2 weeks. He also stop the fluconazole. The patient will follow up with him only when necessary. Readmission: 03/15/17 on evaluation today patient presents for initial evaluation concerning the new issues although he has previously been evaluated in our clinic. Unfortunately the previous evaluation led to the patient having to proceed to amputation and so he is somewhat nervous about being here [name], [name] ([id number]) today. With that being said he has a very slight blister that occurred on the plantar aspect more medial on the right great toe that has been present for just a very short amount of time, several days. With that being said he felt like initially when he called that he somewhat overreacted but due to the fact that his previous issue led to amputation he is very cautious these days I explained that he did the right thing. With that being said he has been tolerating the dressing changes without complication mainly he just been covering this. He does not have any discomfort in secondary to neuropathy it's unlikely that he feels much. With that being said he does tell me  that the issue that he had here is that he went barefoot when he knows he should not have which subsequently led to the blisters. He states is definitely not doing that anymore. His most recent hemoglobin A1c was December and registered 6.9 his ABI today was 1.1. 03/27/17 on evaluation today patient appears to be doing great in regard to his right great toe ulcer. He has been tolerating the dressing changes without complication. The good news is this is making excellent progress and he seems to be caring for this in an excellent fashion. I see no evidence of breakdown that would make me concerned that he was at risk for infection/amputation. This has obviously been his concern due to the fifth toe amputation that was necessitated previous when he had a similar issue. Nonetheless this seems to be progressing much more nicely. Readmission: 03/13/2019 upon evaluation today patient presents for reevaluation here in the clinic concerning issues he has been having with his right foot on the lateral portion at the proximal end of the metatarsal. Subsequently he tells me that this just opened up in the past couple of days or so and the erythema really began about 24 to 48 hours ago. Fortunately there is no signs of systemic infection. 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 That left him with this bony prominence that is where the wound is currently. There is erythema surrounding that does have me  concerned about cellulitis. With that being said he was noncompressible as far as ABIs are concerned I do think we can need to check into arterial studies at this point. Patient does have a history of hypertension along with diabetes mellitus type 2. He also tends to develop quite a bit of callus white often. 03/20/2019 upon evaluation today patient appears to be doing very well in regard to his wounds currently. He has been tolerating the dressing changes without  complication. Fortunately there is no signs of infection. His ABIs were good at this point and registered at 1.07 on the left and 1.09 on the right. In regard to the x-ray this was negative as well for any signs of osteomyelitis. The patient also seems to be doing better in regard to the infection. He still has several days of the antibiotic left at this point best the Bactrim DS and he seems to be doing very well with this. Overall I am extremely pleased with the progress in 1 week's time he is going require some sharp debridement today however. 03/27/2019 upon evaluation today patient actually appears to be making some progress in regard to his wound. It is a little deeper but measuring smaller which is good news due to the fact that again were clear away some of the necrotic tissue which is why the depth is increasing a little bit. Nonetheless after like were getting very close to being down at a good wound bed. Fortunately there is no signs of active infection at this time. There is a little bit of erythema immediately surrounding the wound that we do want to be very cognizant of and careful about. For that reason I am going to go ahead and extend his antibiotic today for an additional 10 days that is the Bactrim. 04/03/2019 upon evaluation today patient appears to be making some progress. I do feel like the Annitta Needs is doing a good job along with the alginate is being packed into the wound space behind. He has been tolerating the dressing changes without complication. Fortunately there is no signs of active infection at this time. No fevers, chills, nausea, vomiting, or diarrhea. 04/10/2019 upon evaluation today patient appears to be making progress. The wound is not quite as deep but he still does not have a great wound surface yet for working on this and the Santyl does seem to be cleaning things up. Fortunately there is no evidence of active infection at this time. No fevers, chills, nausea, vomiting,  or diarrhea. 04/17/2019 upon evaluation today patient appears to be doing excellent at this time in regard to his foot ulcer. I do believe the Santyl with the alginate packed in behind has been of benefit for him and I am very pleased in this regard. With that being said I am feeling like the wound surface is improving quite a bit each time I see him still I do not believe we are at the point of stating that the wound bed is perfect but again were making progress. The patient is going require some sharp debridement today. 4/22; this is a patient who is using Santyl with calcium alginate. Apparently his wound dimensions have been improving. Patient is changing the dressing himself. He has a surgical shoe. He works in a sitting position therefore is not on his feet all that much. There is undermining 05/01/2019 upon evaluation today patient appears to be doing a little better in regard to the size of his wound though he still has some  depth. This does seem to be much cleaner has been using Santyl on the base of the wound followed by packing with silver alginate and behind. Nonetheless I do believe that based on what we are seeing we may need to switch to a collagen type dressing endoform may actually be excellent for him. 05/08/2019 upon evaluation today patient's wound actually appears to be showing better granulation tissue in the base of the wound. With that being said he does have some epiboly noted around the edges of the wound especially plantar and more distal. Subsequently this is can require sharp debridement today. Fortunately there is no signs of active systemic infection though I do feel like there is some local cellulitis noted at this point. The patient also notes has had increased drainage. 5/13; wound volume quite a bit improved this week down to 3 mm. Patient states pain and drainage better. Culture from last week grew staph aureus [not MRSA] and this is sensitive to quinolones and he is on  Levaquin. HOWEVER he also grew abundant Enterococcus faecalis treatment of choice for this is ampicillin. Quinolone coverage is unreliable 5/20 completing the Augmentin. Small wound on the right lateral foot. Thick rolled edges around the wound. Using silver alginate 05/29/2019 upon evaluation today patient appears to be doing better compared to last time I saw him. Fortunately there is no signs of active infection and I do feel like he is actually making good progress. Overall the quality of the wound bed along with the size looks improved and the inflammation/erythema that was previously noted by myself when I saw the couple weeks back actually has also resolved and this is great. Overall very pleased with how things stand. 06/06/2019 upon evaluation today patient actually appears to be doing quite well with regard to his foot ulcer with regard to some of the granulation were seen at this point. There does not appear to be any evidence of systemic or local infection although he still has discomfort I feel like things are moving in a slow but surely correct direction. I think we may need to help fill some of the space however even though he is using the collagen we may need to have something behind this such as a plain packing strip to try to help hold the collagen in the base of the wound. 06/12/2019 upon evaluation today patient appears to be doing well with regard to his foot ulcer. This seems to be making some slow but steady progress. The wound does not appear to be as deep today compared to where it was previous. Fortunately there is no signs of active infection at this time. No fevers, chills, nausea, vomiting, or diarrhea. 06/19/2019 on evaluation today patient's wound does not appear to be doing quite as well as what I would like to see. Fortunately there is no signs of systemic infection at this time which is great news. With that being said I do believe that the patient needs to have a wound  culture as I do [name], [name]. ([id number]) believe he likely has a local infection that needs to be addressed. He is also can require some debridement today as well. 06/26/2019 upon evaluation today patient's wound actually appears to be showing signs of excellent improvement today. There does not appear to be any evidence of active infection which is great news. There does not appear to be significant erythema at this point. The patient does have improvement overall in the appearance of the infection he has been  taking the Augmentin the culture came back showing no organism was grown at this point but nonetheless I do believe he has been doing well with the antibiotics I would continue that at this point. 07/03/2019 upon evaluation today patient appears to be doing about the same in regard to his wound. There does not appear to be any evidence of active infection and overall I am pleased with where things stand. The patient is tolerating the dressing changes without complication but it does not appear that they are packing the wound at this time which I think is going to slow down his healing prospects. 07/10/2019 upon evaluation today patient appears to be doing little better in my opinion in regard to the overall moisture collected in the base of the wound. They have been packing this with a little bit of silver alginate and that has done quite well. Overall I am pleased in that regard. Subsequently the redness does not appear to be any worse today I did actually go ahead and marked this since this can be 2 weeks between now and when I see him again I would make sure that if anything changes or worsens he will be able to notice this and go ahead and initiate treatment with the Augmentin. 07/22/2019 patient comes in today a little bit early for evaluation secondary to the fact that he was having issues with increased drainage and concern about infection. With that being said he does appear to have  developed a blister area again proximal to where the original wound opening was in although the wound bed itself actually appears to be doing somewhat better he has had increased drainage and tracking of fluid here due to this blister. Nonetheless I believe that that does need to be cleaned away today but again I am not really certain that I see a lot of evidence for infection at this point. 07/29/2019 on evaluation today patient actually appears to be doing better in regard to his foot ulcer. There is improvement compared to last week which is great news. Nonetheless he does come prepared to apply the total contact cast today which I think would be beneficial for him. He knows he is not supposed to drive he did bring his son with him today in order to drive him. 07/31/2019 upon evaluation today patient appears to be doing well with regard to his wound today. I do not see any signs of infection I do believe that he is definitely showing some signs of improvement here in the office in a couple days as far as the overall erythema and pressure getting to the wound area is concerned. With that being said I do believe that the cast has been of great benefit for him. 08/08/2019 upon evaluation today patient appears to be doing better in regard to his wound. Overall I feel like he is showing signs of improvement. The external measurement is really not bad at all although I did remove some of the callus in order to open up the wound itself the depth is actually coming quite well and I am extremely pleased in that regard I do not think we can to see much improvement externally until we get the internal depth filled in and that again is improved. Fortunately there is no signs of active infection at this time. No fevers, chills, nausea, vomiting, or diarrhea. 08/15/2019 on evaluation today patient actually appears to be doing well with regard to his foot ulcer. This is showing some signs of  minimal improvement from  the standpoint of the depth and appearance of the wound as well as the edges of the wound. It is taking its time and to be honest I do believe that he seems to be progressing well. I am happy with the cast and what it is achieving at this point. Have contemplated checking into Dermagraft for Apligraf for the patient but again he does seem to be making fairly good progress in my opinion. If it has not tremendously changed come next week I may consider one of the 2. 08/22/2019 on evaluation today patient appears to be making good progress with regard to his wound I am still very pleased with where things stand today. Overall I think that he is making good improvements week by week and I believe that we are getting continue with the Total contact cast I will likely switch to a silver alginate dressing. 08/29/2019 on evaluation today patient appears to be doing quite well with regard to his wound. Fortunately this is measuring quite small. There is no signs of active infection at this time although he did have a little bit of a callus area just to the side of his foot where we previously noted some fluid collecting underneath that was the case today as well. I did have to remove this. With that being said overall he seems to be doing quite well. 09/05/19 on evaluation today patient appears to be doing very well with regard to his original wound which is actually very small on evaluation today. With that being said he does have a blistered area that's more proximal to where this wound is that has been a little bit more concerned. Fortunately there is no signs of active infection at this time. I'm going to need to remove the blister tissue today. 10 09/12/2019 on evaluation today patient actually appears to be doing much better in regard to his wound. I think he is actually improved more without the cast that he was prior with the cast. Overall very pleased with where things stand today. 09/22/2019 upon  evaluation today patient appears to be doing very well at this time in regard to his foot ulcer. In fact I feel like he is continue to make good progress. I see no signs right now that there is any active infection which is great news. Also see very little evidence of pressure he does have some callus buildup but again this seems to be minimal. 09/29/2019 upon evaluation today patient appears to be doing well with regard to his foot ulcer. He has been tolerating the dressing changes without complication. Fortunately there is no signs of active infection at this time. No fevers, chills, nausea, vomiting, or diarrhea. 10/28/2019 upon evaluation today patient appears to be doing a little bit more poorly than last time I saw him as last time we assumed he was completely healed. Apparently that was not the case or else this is reopened but I tend to believe that this probably has never completely closed internally though it appeared to be. He has been using the offloading felt which seems to have done well there is no signs of redness no evidence of erythema all of which is good news. Nonetheless I am concerned about the fact that he is having issues here with what appears to be a reopening of the wound. 11/04/2019 upon evaluation today patient's wound actually showed signs of fairly good improvement today which is great news the wound bed does not appear to  be significantly callus covered and also does not appear to show any signs of significant infection or really any infection whatsoever. I am very pleased in this regard. In general I think that he is making good progress although this is a lot slower than he would like to see I think we are headed back in the correct direction. He is continuing to use the offloading felt. 11/18/2019 on evaluation today patient appears to be having some callus buildup and cracking that occurred at the wound site. Subsequently actually did have to perform a fairly  significant debridement on the area to clean away some of the callus buildup he tolerated that today with some bleeding with that being said I do feel like the wound appear to be doing much better post debridement. [name], [name] ([id number]) 11/25/2019 upon evaluation today patient appears to be doing somewhat poorly in regard to his foot ulcer. Unfortunately he is having some issues with cellulitis along the lateral aspect of his foot. Unfortunately he seems to be having some issues today with infection/cellulitis in regard to the foot. It still towards the proximal/dorsal surface of where the wound is and this subsequently is the area that always seems to cause him trouble. 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 I do believe we may need to look into this further to ensure that there is nothing more significant going on considering that he seems to have an ongoing issue with this at this point. 12/02/2019 upon evaluation today patient appears to be doing better in regard to his foot ulcer. This is great news. Fortunately there is no signs of active infection systemically which is also excellent. He has been tolerating the dressing changes without complication. With that being said he does tell me that the Augmentin has been causing him some trouble as far as headaches are concerned since he is been taking it. Is unsure if it is directly related or not. Nonetheless as I explained to him it is possible that this could be something that he is experiencing as a result of the antibiotic. The good news is there are other options that we could switch him to that would work equally well while at the same time seeing if indeed the antibiotic was causing the headaches. He does want to proceed with that. 12/09/2019 on evaluation today patient's wound actually showing some signs of improvement. Fortunately there is no signs of active infection at this time great news. No fever chills noted. He has  been using the alginate which I think has done well for him up to this point. No fevers, chills, nausea, vomiting, or diarrhea. Electronic Signature(s) Signed: 12/09/2019 5:00:32 PM By: [name] PA-C Entered By: [name] on 12/09/2019 17:00:31 [name], [name] ([id number]) -------------------------------------------------------------------------------- Physical Exam Details Patient Name: [name], [name] Date of Service: 12/09/2019 8:15 AM Medical Record Number: [id number] Patient Account Number: [id number] Date of Birth/Sex: 08-11-1965 (54 y.o. M) Treating RN: Cornell Barman Primary Care Provider: Berneta Sages Other Clinician: Referring Provider: Berneta Sages Treating Provider/Extender: Skipper Cliche in Treatment: 84 Constitutional Well-nourished and well-hydrated in no acute distress. Respiratory normal breathing without difficulty. Psychiatric this patient is able to make decisions and demonstrates good insight into disease process. Alert and Oriented x 3. pleasant and cooperative. Notes Upon inspection patient's wound bed actually showed signs of good granulation at this time. There does not appear to be any evidence of active infection which is great news and overall I am extremely  pleased with where things stand today. With that being said his x-ray was normal did not show any abnormalities fortunately although were still waiting on getting the MRI scheduled at this point. Electronic Signature(s) Signed: 12/09/2019 5:01:51 PM By: [name] PA-C Entered By: [name] on 12/09/2019 17:01:51 [name] ([id number]) -------------------------------------------------------------------------------- Physician Orders Details Patient Name: [name] Date of Service: 12/09/2019 8:15 AM Medical Record Number: [id number] Patient Account Number: [id number] Date of Birth/Sex: 1965/07/24 (54 y.o. 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 - in office Anesthetic (add to Medication List) Wound #4R Right,Lateral Foot o Topical Lidocaine 4% cream applied to wound bed prior to debridement (In Clinic Only). Primary Wound Dressing Wound #4R Right,Lateral Foot o Silver Alginate Secondary Dressing Wound #4R Right,Lateral Foot o Conform/Kerlix - Offloading felt donut, dry gauze, roll gauze to cover Dressing Change Frequency Wound #4R Right,Lateral Foot o Change Dressing Monday, Wednesday, Friday Follow-up Appointments Wound #4R Right,Lateral Foot o Return Appointment in 1 week. Off-Loading Wound #4R Right,Lateral Foot o Open toe surgical shoe with peg assist. - keep pressure off foot as much as possible. Electronic Signature(s) Signed: 12/09/2019 10:25:55 AM By: Georges Mouse, Minus Breeding Signed: 12/09/2019 5:22:05 PM By: [name] PA-C Entered By: Georges Mouse, Minus Breeding on 12/09/2019 08:45:28 [name] ([id number]) -------------------------------------------------------------------------------- Problem List Details Patient Name: [name], [name]. Date of Service: 12/09/2019 8:15 AM Medical Record Number: [id number] Patient Account Number: [id number] Date of Birth/Sex: [birth year] (54 y.o. 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 M) Treating RN: Cornell Barman Primary Care Provider: Berneta Sages Other Clinician: Referring Provider: Berneta Sages Treating Provider/Extender: Skipper Cliche in Treatment: 63 Subjective Chief Complaint Information obtained from Patient Right lateral foot ulcer History of Present Illness (HPI) This 54 year old male comes with an ulcerated area on the plantar aspect of the right foot which she's had for approximately a month. I have known him from a previous visit at [facility] wound center and was treated in the months of April and May 2016 and rapidly healed a left plantar ulcer with a total contact cast. He has been a diabetic for about 16 years and tries to keep  active and is fairly compliant with his diabetes management. 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 Past medical history significant for hypertension, hyperlipidemia, and status post appendectomy 1993. He does not smoke or drink alcohol. 01/14/2015 -- the patient  had tolerated his total contact cast very well and had no problems and has had no systemic symptoms. However when his total contact cast was cut open he had excessive amount of purulent drainage in spite of being on antibiotics. He had had a recent x-ray done in the ER 12/22/2014 which showed IMPRESSION:No evidence of osseous erosion. Known soft tissue ulceration is not well characterized on radiograph. Scattered vascular calcifications seen. his last hemoglobin A1c in December was 7.3. He has been on Augmentin and doxycycline for the last 2 weeks. 01/21/2015 -- his culture grew rare growth of Pantoea species an MR moderate growth of Candida parapsilosis. it is sensitive to levofloxacin. He has not heard back from the insurance company regarding his hyperbaric oxygen therapy. His MRI has not been done yet and we will try and get him an earlier date 01/28/2015 -- MRI was done last night -- IMPRESSION:1. Soft tissue ulcer overlying the plantar aspect of the fifth metatarsal head extending to the cortex. Subcortical marrow edema in the fifth metatarsal head with corresponding T1 hypointensity is concerning for early osteomyelitis of the plantar lateral aspect of the fifth metatarsal head. Chest x-ray done on 01/14/2015 shows bronchiectatic changes without infiltrate. EKG done on generally 17 2017 shows a normal sinus rhythm and is a normal EKG. 02/04/2015 -- he was asked to see Dr. [name] last week and had 2 appointments but had to cancel both due to pressures of work. Last night he has woken up with severe pain in the foot and leg and it is swollen up. No fever or no change in his blood glucose. Addendum: I spoke with Dr. [name] who kindly agreed to accept the patient for inpatient therapy and have also opened to the hospitalist Dr. [name], and discuss details of the management including PICC line and repeat cultures. 02/12/2015-- -- was seen by Dr. [name] in the hospital and a PICC line was  placed. He was to receive Ceftazidime 2 g every 12 hourly, oral levofloxacin 750 mg every 24 hourly and oral fluconazole 200 mg daily. The antibiotics were to be given for 4 weeks except the Diflucan was to be given for the first 2 weeks. Reviewed note from 02/10/2015 -- and Dr. [name] had recommended management for growth of MSSA and Serratia. He switched him from ceftazidime to ceftriaxone 2 g every 24 hours. Levofloxacin was stopped and he would continue on fluconazole for another week. He had asked me to decide whether further imaging was necessary and whether surgical debridement of the infected bone was needed. He is doing well and has been off work for this week and we will keep him off the next week. 02/22/2015 -- he was seen by my colleague on 01/19/2015 and at that time an incision and drainage was done on his right lateral forefoot on the dorsum. Today when I probed this wound it is frankly draining pus and it communicates with the ulcer on the plantar aspect of his right foot. The patient is still on IV ceftriaxone 2 g every 24 hours and is to be seen by Dr. [name] on Friday. 03/19/2015 -- On 03/04/2015 I spoke to Dr. Celesta Gentile who saw him in the office today and did an x-ray of his right foot and noted that  there was osteomyelitis of the right fifth toe and metatarsal and a lot of pus draining from the wound. He recommended operative debridement which would probably result in the fifth metatarsal head and toe amputation.The patient would be referred back to [place] once he was done with surgery. He was admitted to [facility] yesterday and had surgery done by podiatry for a right fifth metatarsal acute osteomyelitis with cellulitis and abscess. He had a right foot incision and drainage with fifth metatarsal partial amputation and removal of toe infected bone and soft tissue with cultures. The wound was partially closed and packing of the distal end was done. Patient  was already on cefepime 2 g IV every 8 hourly and put on vancomycin pending final cultures. He had grown moderate gram-negative rods, and later found to be rare diphtheroids. I received a call from Dr. Cannon Kettle the podiatrist and we discussed the above. On 03/10/2015 he was found positive for influenza a and has been put on Tamiflu. Since his discharge he has been seen by Dr. Cannon Kettle who is planning to remove his sutures this coming week. He was reviewed by Dr. [name] on 03/17/2015 and his Diflucan was stopped and vancomycin. After the 3 doses he is taking. He is going to change Ceftazidime to Zosyn 3.375 g IV every 8 hours. 03/25/2015 -- he was seen by the podiatrist a couple of days ago and the sutures were removed. She will follow back with him in 4 weeks' time and at that time x-rays will be taken and a custom molded insert would be made for his shoe. 04/01/2015 -- he was seen by Dr. [name] on 03/29/2015 who pulled the PICC line stopped his IV antibiotics and recommended starting doxycycline and levofloxacin for 2 weeks. He also stop the fluconazole. The patient will follow up with him only when necessary. [name], [name] ([id number]) Readmission: 03/15/17 on evaluation today patient presents for initial evaluation concerning the new issues although he has previously been evaluated in our clinic. Unfortunately the previous evaluation led to the patient having to proceed to amputation and so he is somewhat nervous about being here today. With that being said he has a very slight blister that occurred on the plantar aspect more medial on the right great toe that has been present for just a very short amount of time, several days. With that being said he felt like initially when he called that he somewhat overreacted but due to the fact that his previous issue led to amputation he is very cautious these days I explained that he did the right thing. With that being said he has been  tolerating the dressing changes without complication mainly he just been covering this. He does not have any discomfort in secondary to neuropathy it's unlikely that he feels much. With that being said he does tell me that the issue that he had here is that he went barefoot when he knows he should not have which subsequently led to the blisters. He states is definitely not doing that anymore. His most recent hemoglobin A1c was December and registered 6.9 his ABI today was 1.1. 03/27/17 on evaluation today patient appears to be doing great in regard to his right great toe ulcer. He has been tolerating the dressing changes without complication. The good news is this is making excellent progress and he seems to be caring for this in an excellent fashion. I see no evidence of breakdown that would make me concerned that he was at  risk for infection/amputation. This has obviously been his concern due to the fifth toe amputation that was necessitated previous when he had a similar issue. Nonetheless this seems to be progressing much more nicely. Readmission: 03/13/2019 upon evaluation today patient presents for reevaluation here in the clinic concerning issues he has been having with his right foot on the lateral portion at the proximal end of the metatarsal. Subsequently he tells me that this just opened up in the past couple of days or so and the erythema really began about 24 to 48 hours ago. Fortunately there is no signs of systemic infection. 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 That left him with this bony prominence that is where the wound is currently. There is erythema surrounding that does have me concerned about cellulitis. With that being said he was noncompressible as far as ABIs are concerned I do think we can need to check into arterial studies at this point. Patient does have a history of hypertension along with diabetes mellitus type 2. He  also tends to develop quite a bit of callus white often. 03/20/2019 upon evaluation today patient appears to be doing very well in regard to his wounds currently. He has been tolerating the dressing changes without complication. Fortunately there is no signs of infection. His ABIs were good at this point and registered at 1.07 on the left and 1.09 on the right. In regard to the x-ray this was negative as well for any signs of osteomyelitis. The patient also seems to be doing better in regard to the infection. He still has several days of the antibiotic left at this point best the Bactrim DS and he seems to be doing very well with this. Overall I am extremely pleased with the progress in 1 week's time he is going require some sharp debridement today however. 03/27/2019 upon evaluation today patient actually appears to be making some progress in regard to his wound. It is a little deeper but measuring smaller which is good news due to the fact that again were clear away some of the necrotic tissue which is why the depth is increasing a little bit. Nonetheless after like were getting very close to being down at a good wound bed. Fortunately there is no signs of active infection at this time. There is a little bit of erythema immediately surrounding the wound that we do want to be very cognizant of and careful about. For that reason I am going to go ahead and extend his antibiotic today for an additional 10 days that is the Bactrim. 04/03/2019 upon evaluation today patient appears to be making some progress. I do feel like the Annitta Needs is doing a good job along with the alginate is being packed into the wound space behind. He has been tolerating the dressing changes without complication. Fortunately there is no signs of active infection at this time. No fevers, chills, nausea, vomiting, or diarrhea. 04/10/2019 upon evaluation today patient appears to be making progress. The wound is not quite as deep but he still  does not have a great wound surface yet for working on this and the Santyl does seem to be cleaning things up. Fortunately there is no evidence of active infection at this time. No fevers, chills, nausea, vomiting, or diarrhea. 04/17/2019 upon evaluation today patient appears to be doing excellent at this time in regard to his foot ulcer. I do believe the Santyl with the alginate packed  in behind has been of benefit for him and I am very pleased in this regard. With that being said I am feeling like the wound surface is improving quite a bit each time I see him still I do not believe we are at the point of stating that the wound bed is perfect but again were making progress. The patient is going require some sharp debridement today. 4/22; this is a patient who is using Santyl with calcium alginate. Apparently his wound dimensions have been improving. Patient is changing the dressing himself. He has a surgical shoe. He works in a sitting position therefore is not on his feet all that much. There is undermining 05/01/2019 upon evaluation today patient appears to be doing a little better in regard to the size of his wound though he still has some depth. This does seem to be much cleaner has been using Santyl on the base of the wound followed by packing with silver alginate and behind. Nonetheless I do believe that based on what we are seeing we may need to switch to a collagen type dressing endoform may actually be excellent for him. 05/08/2019 upon evaluation today patient's wound actually appears to be showing better granulation tissue in the base of the wound. With that being said he does have some epiboly noted around the edges of the wound especially plantar and more distal. Subsequently this is can require sharp debridement today. Fortunately there is no signs of active systemic infection though I do feel like there is some local cellulitis noted at this point. The patient also notes has had increased  drainage. 5/13; wound volume quite a bit improved this week down to 3 mm. Patient states pain and drainage better. Culture from last week grew staph aureus [not MRSA] and this is sensitive to quinolones and he is on Levaquin. HOWEVER he also grew abundant Enterococcus faecalis treatment of choice for this is ampicillin. Quinolone coverage is unreliable 5/20 completing the Augmentin. Small wound on the right lateral foot. Thick rolled edges around the wound. Using silver alginate 05/29/2019 upon evaluation today patient appears to be doing better compared to last time I saw him. Fortunately there is no signs of active infection and I do feel like he is actually making good progress. Overall the quality of the wound bed along with the size looks improved and the inflammation/erythema that was previously noted by myself when I saw the couple weeks back actually has also resolved and this is great. Overall very pleased with how things stand. 06/06/2019 upon evaluation today patient actually appears to be doing quite well with regard to his foot ulcer with regard to some of the granulation were seen at this point. There does not appear to be any evidence of systemic or local infection although he still has discomfort I feel like things are moving in a slow but surely correct direction. I think we may need to help fill some of the space however even though he is using the collagen we may need to have something behind this such as a plain packing strip to try to help hold the collagen in the base of the wound. 06/12/2019 upon evaluation today patient appears to be doing well with regard to his foot ulcer. This seems to be making some slow but steady progress. The wound does not appear to be as deep today compared to where it was previous. Fortunately there is no signs of active infection at [name], [name]. ([id number]) this time. No  fevers, chills, nausea, vomiting, or diarrhea. 06/19/2019 on evaluation  today patient's wound does not appear to be doing quite as well as what I would like to see. Fortunately there is no signs of systemic infection at this time which is great news. With that being said I do believe that the patient needs to have a wound culture as I do believe he likely has a local infection that needs to be addressed. He is also can require some debridement today as well. 06/26/2019 upon evaluation today patient's wound actually appears to be showing signs of excellent improvement today. There does not appear to be any evidence of active infection which is great news. There does not appear to be significant erythema at this point. The patient does have improvement overall in the appearance of the infection he has been taking the Augmentin the culture came back showing no organism was grown at this point but nonetheless I do believe he has been doing well with the antibiotics I would continue that at this point. 07/03/2019 upon evaluation today patient appears to be doing about the same in regard to his wound. There does not appear to be any evidence of active infection and overall I am pleased with where things stand. The patient is tolerating the dressing changes without complication but it does not appear that they are packing the wound at this time which I think is going to slow down his healing prospects. 07/10/2019 upon evaluation today patient appears to be doing little better in my opinion in regard to the overall moisture collected in the base of the wound. They have been packing this with a little bit of silver alginate and that has done quite well. Overall I am pleased in that regard. Subsequently the redness does not appear to be any worse today I did actually go ahead and marked this since this can be 2 weeks between now and when I see him again I would make sure that if anything changes or worsens he will be able to notice this and go ahead and initiate treatment with the  Augmentin. 07/22/2019 patient comes in today a little bit early for evaluation secondary to the fact that he was having issues with increased drainage and concern about infection. With that being said he does appear to have developed a blister area again proximal to where the original wound opening was in although the wound bed itself actually appears to be doing somewhat better he has had increased drainage and tracking of fluid here due to this blister. Nonetheless I believe that that does need to be cleaned away today but again I am not really certain that I see a lot of evidence for infection at this point. 07/29/2019 on evaluation today patient actually appears to be doing better in regard to his foot ulcer. There is improvement compared to last week which is great news. Nonetheless he does come prepared to apply the total contact cast today which I think would be beneficial for him. He knows he is not supposed to drive he did bring his son with him today in order to drive him. 07/31/2019 upon evaluation today patient appears to be doing well with regard to his wound today. I do not see any signs of infection I do believe that he is definitely showing some signs of improvement here in the office in a couple days as far as the overall erythema and pressure getting to the wound area is concerned. With that being said I  do believe that the cast has been of great benefit for him. 08/08/2019 upon evaluation today patient appears to be doing better in regard to his wound. Overall I feel like he is showing signs of improvement. The external measurement is really not bad at all although I did remove some of the callus in order to open up the wound itself the depth is actually coming quite well and I am extremely pleased in that regard I do not think we can to see much improvement externally until we get the internal depth filled in and that again is improved. Fortunately there is no signs of active infection  at this time. No fevers, chills, nausea, vomiting, or diarrhea. 08/15/2019 on evaluation today patient actually appears to be doing well with regard to his foot ulcer. This is showing some signs of minimal improvement from the standpoint of the depth and appearance of the wound as well as the edges of the wound. It is taking its time and to be honest I do believe that he seems to be progressing well. I am happy with the cast and what it is achieving at this point. Have contemplated checking into Dermagraft for Apligraf for the patient but again he does seem to be making fairly good progress in my opinion. If it has not tremendously changed come next week I may consider one of the 2. 08/22/2019 on evaluation today patient appears to be making good progress with regard to his wound I am still very pleased with where things stand today. Overall I think that he is making good improvements week by week and I believe that we are getting continue with the Total contact cast I will likely switch to a silver alginate dressing. 08/29/2019 on evaluation today patient appears to be doing quite well with regard to his wound. Fortunately this is measuring quite small. There is no signs of active infection at this time although he did have a little bit of a callus area just to the side of his foot where we previously noted some fluid collecting underneath that was the case today as well. I did have to remove this. With that being said overall he seems to be doing quite well. 09/05/19 on evaluation today patient appears to be doing very well with regard to his original wound which is actually very small on evaluation today. With that being said he does have a blistered area that's more proximal to where this wound is that has been a little bit more concerned. Fortunately there is no signs of active infection at this time. I'm going to need to remove the blister tissue today. 10 09/12/2019 on evaluation today patient  actually appears to be doing much better in regard to his wound. I think he is actually improved more without the cast that he was prior with the cast. Overall very pleased with where things stand today. 09/22/2019 upon evaluation today patient appears to be doing very well at this time in regard to his foot ulcer. In fact I feel like he is continue to make good progress. I see no signs right now that there is any active infection which is great news. Also see very little evidence of pressure he does have some callus buildup but again this seems to be minimal. 09/29/2019 upon evaluation today patient appears to be doing well with regard to his foot ulcer. He has been tolerating the dressing changes without complication. Fortunately there is no signs of active infection at this time.  No fevers, chills, nausea, vomiting, or diarrhea. 10/28/2019 upon evaluation today patient appears to be doing a little bit more poorly than last time I saw him as last time we assumed he was completely healed. Apparently that was not the case or else this is reopened but I tend to believe that this probably has never completely closed internally though it appeared to be. He has been using the offloading felt which seems to have done well there is no signs of redness no evidence of erythema all of which is good news. Nonetheless I am concerned about the fact that he is having issues here with what appears to be a reopening of the wound. 11/04/2019 upon evaluation today patient's wound actually showed signs of fairly good improvement today which is great news the wound bed does not appear to be significantly callus covered and also does not appear to show any signs of significant infection or really any infection whatsoever. I am very pleased in this regard. In general I think that he is making good progress although this is a lot slower than he would like to see I think we are headed back in the correct direction. He is  continuing to use the offloading felt. [name], [name] ([id number]) 11/18/2019 on evaluation today patient appears to be having some callus buildup and cracking that occurred at the wound site. Subsequently actually did have to perform a fairly significant debridement on the area to clean away some of the callus buildup he tolerated that today with some bleeding with that being said I do feel like the wound appear to be doing much better post debridement. 11/25/2019 upon evaluation today patient appears to be doing somewhat poorly in regard to his foot ulcer. Unfortunately he is having some issues with cellulitis along the lateral aspect of his foot. Unfortunately he seems to be having some issues today with infection/cellulitis in regard to the foot. It still towards the proximal/dorsal surface of where the wound is and this subsequently is the area that always seems to cause him trouble. 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 I do believe we may need to look into this further to ensure that there is nothing more significant going on considering that he seems to have an ongoing issue with this at this point. 12/02/2019 upon evaluation today patient appears to be doing better in regard to his foot ulcer. This is great news. Fortunately there is no signs of active infection systemically which is also excellent. He has been tolerating the dressing changes without complication. With that being said he does tell me that the Augmentin has been causing him some trouble as far as headaches are concerned since he is been taking it. Is unsure if it is directly related or not. Nonetheless as I explained to him it is possible that this could be something that he is experiencing as a result of the antibiotic. The good news is there are other options that we could switch him to that would work equally well while at the same time seeing if indeed the antibiotic was causing the headaches. He does want to  proceed with that. 12/09/2019 on evaluation today patient's wound actually showing some signs of improvement. Fortunately there is no signs of active infection at this time great news. No fever chills noted. He has been using the alginate which I think has done well for him up to this point. No fevers, chills, nausea, vomiting, or diarrhea. Objective Constitutional Well-nourished and well-hydrated  in no acute distress. Vitals Time Taken: 8:22 AM, Height: 74 in, Weight: 308 lbs, BMI: 39.5, Temperature: 98.6 F, Pulse: 76 bpm, Respiratory Rate: 16 breaths/min, Blood Pressure: 149/74 mmHg. Respiratory normal breathing without difficulty. Psychiatric this patient is able to make decisions and demonstrates good insight into disease process. Alert and Oriented x 3. pleasant and cooperative. General Notes: Upon inspection patient's wound bed actually showed signs of good granulation at this time. There does not appear to be any evidence of active infection which is great news and overall I am extremely pleased with where things stand today. With that being said his x-ray was normal did not show any abnormalities fortunately although were still waiting on getting the MRI scheduled at this point. Integumentary (Hair, Skin) Wound #4R status is Open. Original cause of wound was Gradually Appeared. The wound is located on the Right,Lateral Foot. The wound measures 0.3cm length x 0.4cm width x 0.4cm depth; 0.094cm^2 area and 0.038cm^3 volume. There is Fat Layer (Subcutaneous Tissue) exposed. There is no tunneling noted, however, there is undermining starting at 12:00 and ending at 12:00 with a maximum distance of 0.2cm. There is a small amount of serosanguineous drainage noted. The wound margin is flat and intact. There is medium (34-66%) red, pink granulation within the wound bed. There is a medium (34-66%) amount of necrotic tissue within the wound bed including Adherent Slough. Assessment Active  Problems ICD-10 Type 2 diabetes mellitus with foot ulcer 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d Cellulitis of right lower limb Corns and callosities Essential (primary) hypertension Acquired absence of other left toe(s) [name], LANK. ([id number]) Procedures Wound #4R Pre-procedure diagnosis of Wound #4R is a Diabetic Wound/Ulcer of the Lower Extremity located on the Right,Lateral Foot .Severity of Tissue Pre Debridement is: Fat layer exposed. There was a Excisional Skin/Subcutaneous Tissue Debridement with a total area of 0.12 sq cm performed by Tommie Sams., PA-C. With the following instrument(s): Curette to remove Viable and Non-Viable tissue/material. Material removed includes Callus, Subcutaneous Tissue, and Slough after achieving pain control using Lidocaine 4% Topical Solution. A time out was conducted at 08:40, prior to the start of the procedure. A Minimum amount of bleeding was controlled with Pressure. The procedure was tolerated well with a pain level of 0 throughout and a pain level of 0 following the procedure. Post Debridement Measurements: 0.3cm length x 0.4cm width x 0.5cm depth; 0.047cm^3 volume. Character of Wound/Ulcer Post Debridement is stable. Severity of Tissue Post Debridement is: Fat layer exposed. Post procedure Diagnosis Wound #4R: Same as Pre-Procedure Plan Wound Cleansing: Wound #4R Right,Lateral Foot: Clean wound with Normal Saline. - in office Anesthetic (add to Medication List): Wound #4R Right,Lateral Foot: Topical Lidocaine 4% cream applied to wound bed prior to debridement (In Clinic Only). Primary Wound Dressing: Wound #4R Right,Lateral Foot: Silver Alginate Secondary Dressing: Wound #4R Right,Lateral Foot: Conform/Kerlix - Offloading felt donut, dry gauze, roll gauze to cover Dressing Change Frequency: Wound #4R Right,Lateral Foot: Change Dressing Monday, Wednesday, Friday Follow-up Appointments:  Wound #4R Right,Lateral Foot: Return Appointment in 1 week. Off-Loading: Wound #4R Right,Lateral Foot: Open toe surgical shoe with peg assist. - keep pressure off foot as much as possible. 1. Would recommend currently that we going continue with the silver alginate dressing as I feel like that still doing well for the patient. 2. I am also can recommend that we have him continue with the offloading shoe I think that still important to try to  keep pressure off the area. 3. 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 Therefore I think he may need custom shoes as well we discussed that today. We will see patient back for reevaluation in 1 week here in the clinic. If anything worsens or changes patient will contact our office for additional recommendations. Electronic Signature(s) Signed: 12/09/2019 5:04:26 PM By: [name] PA-C Entered By: [name] on 12/09/2019 17:04:25 [name] ([id number]) -------------------------------------------------------------------------------- SuperBill Details Patient Name: [name] Date of Service: 12/09/2019 Medical Record Number: [id number] Patient Account Number: [id number] Date of Birth/Sex: 03/23/1965 (54 y.o. M) Treating RN: Cornell Barman Primary Care Provider: Berneta Sages Other Clinician: Referring Provider: Berneta Sages Treating Provider/Extender: Skipper Cliche in Treatment: 38 Diagnosis Coding ICD-10 Codes Code Description E11.621 Type 2 diabetes mellitus with foot ulcer L97.512 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d L03.115 Cellulitis of right lower  limb L84 Corns and callosities I10 Essential (primary) hypertension Z89.422 Acquired absence of other left toe(s) Facility Procedures CPT4 Code: [id number] Description: 54360 - DEB SUBQ TISSUE 20 SQ CM/< Modifier: Quantity: 1 CPT4 Code: Description: ICD-10 Diagnosis Description L97.512 Non-pressure chronic ulcer of other part of right foot with fat layer ex Modifier: posed Quantity: Physician Procedures CPT4 Code: [id number] Description: 11042 - WC PHYS SUBQ TISS 20 SQ CM Modifier: Quantity: 1 CPT4 Code: Description: ICD-10 Diagnosis Description L97.512 Non-pressure chronic ulcer of other part of right foot with fat layer ex Modifier: posed Quantity: Electronic Signature(s) Signed: 12/09/2019 5:04:40 PM By: [name] PA-C Entered By: [name] on 12/09/2019 17:04:38

## 2019-12-09 NOTE — Progress Notes (Signed)
[name], [name] ([id number]) Visit Report for 12/09/2019 Arrival Information Details Patient Name: [name], [name]. Date of Service: 12/09/2019 8:15 AM Medical Record Number: [id number] Patient Account Number: [id number] Date of Birth/Sex: Jan 15, 1965 (54 y.o. M) Treating RN: Cornell Barman Primary Care Letty Salvi: Berneta Sages Other Clinician: Referring Eliza Grissinger: Berneta Sages Treating Seana Underwood/Extender: Skipper Cliche in Treatment: 70 Visit Information History Since Last Visit Has Dressing in Place as Prescribed: Yes Patient Arrived: Ambulatory Has Footwear/Offloading in Place as Prescribed: Yes Arrival Time: 08:20 Right: Surgical Shoe with Pressure Relief Accompanied By: self Insole Transfer Assistance: None Pain Present Now: No Patient Identification Verified: Yes Secondary Verification Process Completed: Yes Patient Requires Transmission-Based Precautions: No Patient Has Alerts: Yes Patient Alerts: DMII Electronic Signature(s) Signed: 12/09/2019 5:20:48 PM By: Gretta Cool, BSN, RN, CWS, Kim RN, BSN Entered By: Gretta Cool, BSN, RN, CWS, Kim on 12/09/2019 08:22:53 [name] ([id number]) -------------------------------------------------------------------------------- Encounter Discharge Information Details Patient Name: [name], [name]. Date of Service: 12/09/2019 8:15 AM Medical Record Number: [id number] Patient Account Number: [id number] Date of Birth/Sex: 1965-01-08 (54 y.o. M) Treating RN: Dolan Amen Primary Care Malekai Markwood: Berneta Sages Other Clinician: Referring Taleyah Hillman: Berneta Sages Treating Greta Yung/Extender: Skipper Cliche in Treatment: 83 Encounter Discharge Information Items Post Procedure Vitals Discharge Condition: Stable Temperature (F): 98.6 Ambulatory Status: Ambulatory Pulse (bpm): 76 Discharge Destination: Home Respiratory Rate (breaths/min): 16 Transportation: Private Auto Blood Pressure (mmHg): 149/74 Accompanied By: self Schedule Follow-up  Appointment: Yes Clinical Summary of Care: Electronic Signature(s) Signed: 12/09/2019 10:25:55 AM By: Georges Mouse, Minus Breeding Entered By: Georges Mouse, Minus Breeding on 12/09/2019 08:55:00 [name] ([id number]) -------------------------------------------------------------------------------- Lower Extremity Assessment Details Patient Name: [name] Date of Service: 12/09/2019 8:15 AM Medical Record Number: [id number] Patient Account Number: [id number] Date of Birth/Sex: 1965/08/11 (54 y.o. M) Treating RN: Cornell Barman Primary Care Shady Bradish: Berneta Sages Other Clinician: Referring Mishaal Lansdale: Berneta Sages Treating Dudley Cooley/Extender: Skipper Cliche in Treatment: 38 Vascular Assessment Pulses: Dorsalis Pedis Palpable: [Right:Yes] Electronic Signature(s) Signed: 12/09/2019 5:20:48 PM By: Gretta Cool, BSN, RN, CWS, Kim RN, BSN Entered By: Gretta Cool, BSN, RN, CWS, Kim on 12/09/2019 08:29:18 [name] ([id number]) -------------------------------------------------------------------------------- Multi Wound Chart Details Patient Name: [name], [name]. Date of Service: 12/09/2019 8:15 AM Medical Record Number: [id number] Patient Account Number: [id number] Date of Birth/Sex: 02-20-1965 (54 y.o. M) Treating RN: Dolan Amen Primary Care Aadan Chenier: Berneta Sages Other Clinician: Referring Nour Rodrigues: Berneta Sages Treating Kandie Keiper/Extender: Skipper Cliche in Treatment: 38 Vital Signs Height(in): 74 Pulse(bpm): 85 Weight(lbs): 308 Blood Pressure(mmHg): 149/74 Body Mass Index(BMI): 40 Temperature(F): 98.6 Respiratory Rate(breaths/min): 16 Photos: [N/A:N/A] Wound Location: Right, Lateral Foot N/A N/A Wounding Event: Gradually Appeared N/A N/A Primary Etiology: Diabetic Wound/Ulcer of the Lower N/A N/A Extremity Comorbid History: Hypertension, Type II Diabetes, N/A N/A Neuropathy Date Acquired: 03/12/2019 N/A N/A Weeks of Treatment: 38 N/A N/A Wound Status: Open N/A  N/A Wound Recurrence: Yes N/A N/A Pending Amputation on Yes N/A N/A Presentation: Measurements L x W x D (cm) 0.3x0.4x0.4 N/A N/A Area (cm) : 0.094 N/A N/A Volume (cm) : 0.038 N/A N/A % Reduction in Area: 85.20% N/A N/A % Reduction in Volume: 70.10% N/A N/A Starting Position 1 (o'clock): 12 Ending Position 1 (o'clock): 12 Maximum Distance 1 (cm): 0.2 Undermining: Yes N/A N/A Classification: Grade 1 N/A N/A Exudate Amount: Small N/A N/A Exudate Type: Serosanguineous N/A N/A Exudate Color: red, brown N/A N/A Wound Margin: Flat and Intact N/A N/A Granulation Amount: Medium (34-66%) N/A N/A Granulation Quality: Red, Pink N/A N/A Necrotic Amount: Medium (34-66%) N/A N/A  Exposed Structures: Fat Layer (Subcutaneous Tissue): N/A N/A Yes Fascia: No Tendon: No Muscle: No Joint: No Bone: No Epithelialization: Medium (34-66%) N/A N/A Treatment Notes QUILL, GRINDER ([id number]) Electronic Signature(s) Signed: 12/09/2019 10:25:55 AM By: Georges Mouse, Minus Breeding Entered By: Georges Mouse, Minus Breeding on 12/09/2019 08:40:56 [name] ([id number]) -------------------------------------------------------------------------------- [place] Details Patient Name: [name], [name]. Date of Service: 12/09/2019 8:15 AM Medical Record Number: [id number] Patient Account Number: [id number] Date of Birth/Sex: 1965/08/21 (54 y.o. M) Treating RN: Dolan Amen Primary Care Shelvy Heckert: Berneta Sages Other Clinician: Referring Manuel Lawhead: Berneta Sages Treating Reuben Knoblock/Extender: Skipper Cliche in Treatment: 38 Active Inactive Necrotic Tissue Nursing Diagnoses: Impaired tissue integrity related to necrotic/devitalized tissue Knowledge deficit related to management of necrotic/devitalized tissue Goals: Necrotic/devitalized tissue will be minimized in the wound bed Date Initiated: 11/25/2019 Target Resolution Date: 12/11/2019 Goal Status: Active Patient/caregiver will  verbalize understanding of reason and process for debridement of necrotic tissue Date Initiated: 11/25/2019 Target Resolution Date: 12/11/2019 Goal Status: Active Interventions: Assess patient pain level pre-, during and post procedure and prior to discharge Treatment Activities: Excisional debridement : 11/25/2019 Notes: Nutrition Nursing Diagnoses: Impaired glucose control: actual or potential Goals: Patient/caregiver verbalizes understanding of need to maintain therapeutic glucose control per primary care physician Date Initiated: 03/13/2019 Target Resolution Date: 04/11/2019 Goal Status: Active Interventions: Assess patient nutrition upon admission and as needed per policy Notes: Orientation to the Wound Care Program Nursing Diagnoses: Knowledge deficit related to the wound healing center program Goals: Patient/caregiver will verbalize understanding of the [place] Date Initiated: 03/13/2019 Target Resolution Date: 04/11/2019 Goal Status: Active Interventions: Provide education on orientation to the wound center Notes: Wound/Skin Impairment Nursing Diagnoses: [name], [name] ([id number]) Impaired tissue integrity Goals: Ulcer/skin breakdown will have a volume reduction of 30% by week 4 Date Initiated: 03/13/2019 Target Resolution Date: 04/11/2019 Goal Status: Active Interventions: Assess ulceration(s) every visit Notes: Electronic Signature(s) Signed: 12/09/2019 10:25:55 AM By: Georges Mouse, Minus Breeding Entered By: Georges Mouse, Minus Breeding on 12/09/2019 08:39:52 [name] ([id number]) -------------------------------------------------------------------------------- Pain Assessment Details Patient Name: [name] Date of Service: 12/09/2019 8:15 AM Medical Record Number: [id number] Patient Account Number: [id number] Date of Birth/Sex: Oct 18, 1965 (54 y.o. M) Treating RN: Cornell Barman Primary Care Heron Pitcock: Berneta Sages Other  Clinician: Referring Jordayn Mink: Berneta Sages Treating Arryn Terrones/Extender: Skipper Cliche in Treatment: 38 Active Problems Location of Pain Severity and Description of Pain Patient Has Paino No Site Locations Pain Management and Medication Current Pain Management: Notes Patient denies pain at this time. Electronic Signature(s) Signed: 12/09/2019 5:20:48 PM By: Gretta Cool, BSN, RN, CWS, Kim RN, BSN Entered By: Gretta Cool, BSN, RN, CWS, Kim on 12/09/2019 08:23:57 [name] ([id number]) -------------------------------------------------------------------------------- Patient/Caregiver Education Details Patient Name: [name], [name]. Date of Service: 12/09/2019 8:15 AM Medical Record Number: [id number] Patient Account Number: [id number] Date of Birth/Gender: 18-Dec-1965 (54 y.o. M) Treating RN: Dolan Amen Primary Care Physician: Berneta Sages Other Clinician: Referring Physician: Berneta Sages Treating Physician/Extender: Skipper Cliche in Treatment: 44 Education Assessment Education Provided To: Patient Education Topics Provided Offloading: Methods: Explain/Verbal Responses: State content correctly Electronic Signature(s) Signed: 12/09/2019 10:25:55 AM By: Georges Mouse, Minus Breeding Entered By: Georges Mouse, Minus Breeding on 12/09/2019 08:53:40 [name] ([id number]) -------------------------------------------------------------------------------- Wound Assessment Details Patient Name: [name] Date of Service: 12/09/2019 8:15 AM Medical Record Number: [id number] Patient Account Number: [id number] Date of Birth/Sex: [birth year] (54 y.o. M) Treating RN: Cornell Barman Primary Care Dartanyan Deasis: Berneta Sages Other Clinician: Referring Irie Fiorello: Berneta Sages Treating Jelani Vreeland/Extender: Jeri Cos  Weeks in Treatment: 38 Wound Status Wound Number: [id number] Primary Etiology: Diabetic Wound/Ulcer of the Lower Extremity Wound Location: Right, Lateral Foot Wound Status: Open Wounding  Event: Gradually Appeared Comorbid History: Hypertension, Type II Diabetes, Neuropathy Date Acquired: 03/12/2019 Weeks Of Treatment: 38 Clustered Wound: No Pending Amputation On Presentation Photos Wound Measurements Length: (cm) 0.3 % Reduc Width: (cm) 0.4 % Reduc Depth: (cm) 0.4 Epithel Area: (cm) 0.094 Tunnel Volume: (cm) 0.038 Underm Star Endi Maxi tion in Area: 85.2% tion in Volume: 70.1% ialization: Medium (34-66%) ing: No ining: Yes ting Position (o'clock): 12 ng Position (o'clock): 12 mum Distance: (cm) 0.2 Wound Description Classification: Grade 1 Foul Od Wound Margin: Flat and Intact Slough/ Exudate Amount: Small Exudate Type: Serosanguineous Exudate Color: red, brown or After Cleansing: No Fibrino Yes Wound Bed Granulation Amount: Medium (34-66%) Exposed Structure Granulation Quality: Red, Pink Fascia Exposed: No Necrotic Amount: Medium (34-66%) Fat Layer (Subcutaneous Tissue) Exposed: Yes Necrotic Quality: Adherent Slough Tendon Exposed: No Muscle Exposed: No Joint Exposed: No Bone Exposed: No Treatment Notes Wound #[id number] (Right, Lateral Foot) [name], [name] ([id number]) Notes silver alginate, dry gauze, offloading felt, conform Electronic Signature(s) Signed: 12/09/2019 5:20:48 PM By: Gretta Cool, BSN, RN, CWS, Kim RN, BSN Entered By: Gretta Cool, BSN, RN, CWS, Kim on 12/09/2019 08:28:36 [name] ([id number]) -------------------------------------------------------------------------------- Vitals Details Patient Name: [name] Date of Service: 12/09/2019 8:15 AM Medical Record Number: [id number] Patient Account Number: [id number] Date of Birth/Sex: 12/08/1965 (54 y.o. M) Treating RN: Cornell Barman Primary Care Ivor Kishi: Berneta Sages Other Clinician: Referring Jawon Dipiero: Berneta Sages Treating Kristal Perl/Extender: Skipper Cliche in Treatment: 38 Vital Signs Time Taken: 08:22 Temperature (F): 98.6 Height (in): 74 Pulse (bpm):  76 Weight (lbs): 308 Respiratory Rate (breaths/min): 16 Body Mass Index (BMI): 39.5 Blood Pressure (mmHg): 149/74 Reference Range: 80 - 120 mg / dl Electronic Signature(s) Signed: 12/09/2019 5:20:48 PM By: Gretta Cool, BSN, RN, CWS, Kim RN, BSN Entered By: Gretta Cool, BSN, RN, CWS, Kim on 12/09/2019 08:23:41

## 2019-12-18 ENCOUNTER — Other Ambulatory Visit: Payer: Self-pay

## 2019-12-18 ENCOUNTER — Encounter: Payer: Managed Care, Other (non HMO) | Admitting: Physician Assistant

## 2019-12-18 DIAGNOSIS — E11621 Type 2 diabetes mellitus with foot ulcer: Secondary | ICD-10-CM | POA: Diagnosis not present

## 2019-12-18 NOTE — Progress Notes (Addendum)
CLEBERT, WENGER ([id number]) Visit Report for 12/18/2019 Chief Complaint Document Details Patient Name: [name], [name]. Date of Service: 12/18/2019 3:30 PM Medical Record Number: [id number] Patient Account Number: [id number] Date of Birth/Sex: [birth year] (54 y.o. M) Treating RN: Cornell Barman Primary Care Provider: Berneta Sages Other Clinician: Referring Provider: Berneta Sages Treating Provider/Extender: Skipper Cliche in Treatment: 16 Information Obtained from: Patient Chief Complaint Right lateral foot ulcer Electronic Signature(s) Signed: 12/18/2019 3:33:17 PM By: [name] PA-C Entered By: [name] on 12/18/2019 15:33:16 [name] ([id number]) -------------------------------------------------------------------------------- Debridement Details Patient Name: [name] Date of Service: 12/18/2019 3:30 PM Medical Record Number: [id number] Patient Account Number: [id number] Date of Birth/Sex: 1965/08/23 (54 y.o. M) Treating RN: Cornell Barman Primary Care Provider: Berneta Sages Other Clinician: Referring Provider: Berneta Sages Treating Provider/Extender: Skipper Cliche in Treatment: 40 Debridement Performed for Wound #4R Right,Lateral Foot Assessment: Performed By: Physician Tommie Sams., PA-C Debridement Type: Debridement Severity of Tissue Pre Debridement: Fat layer exposed Level of Consciousness (Pre- Awake and Alert procedure): Pre-procedure Verification/Time Out Yes - 15:50 Taken: Total Area Debrided (L x W): 0.7 (cm) x 1.4 (cm) = 0.98 (cm) Tissue and other material Viable, Non-Viable, Callus, Slough, Subcutaneous, Skin: Dermis , Skin: Epidermis, Slough debrided: Level: Skin/Subcutaneous Tissue Debridement Description: Excisional Instrument: Curette Bleeding: Moderate Hemostasis Achieved: Pressure Response to Treatment: Procedure was tolerated well Level of Consciousness (Post- Awake and Alert procedure): Post Debridement  Measurements of Total Wound Length: (cm) 0.7 Width: (cm) 1.4 Depth: (cm) 0.5 Volume: (cm) 0.385 Character of Wound/Ulcer Post Debridement: Stable Severity of Tissue Post Debridement: Limited to breakdown of skin Post Procedure Diagnosis Same as Pre-procedure Electronic Signature(s) Signed: 12/18/2019 4:08:13 PM By: Gretta Cool, BSN, RN, CWS, Kim RN, BSN Signed: 12/18/2019 4:55:27 PM By: [name] PA-C Entered By: Gretta Cool, BSN, RN, CWS, Kim on 12/18/2019 16:08:12 [name], [name] ([id number]) -------------------------------------------------------------------------------- HPI Details Patient Name: [name] Date of Service: 12/18/2019 3:30 PM Medical Record Number: [id number] Patient Account Number: [id number] Date of Birth/Sex: 08-16-1965 (54 y.o. M) Treating RN: Cornell Barman Primary Care Provider: Berneta Sages Other Clinician: Referring Provider: Berneta Sages Treating Provider/Extender: Skipper Cliche in Treatment: 40 History of Present Illness HPI Description: This 54 year old male comes with an ulcerated area on the plantar aspect of the right foot which she's had for approximately a month. I have known him from a previous visit at [facility] wound center and was treated in the months of April and May 2016 and rapidly healed a left plantar ulcer with a total contact cast. He has been a diabetic for about 16 years and tries to keep active and is fairly compliant with his diabetes management. 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 Past medical history significant for hypertension, hyperlipidemia, and status post appendectomy 1993. He does not smoke or drink alcohol. 01/14/2015 -- the patient had tolerated his total contact cast very well and had no problems and has had no systemic symptoms. However when his total contact cast was cut open he had excessive amount of purulent drainage in spite of being on antibiotics. He had had a recent x-ray done in the ER 12/22/2014  which showed IMPRESSION:No evidence of osseous erosion. Known soft tissue ulceration is not well characterized on radiograph. Scattered vascular calcifications seen. his last hemoglobin A1c in December was 7.3. He has been on Augmentin and doxycycline for the last 2 weeks. 01/21/2015 -- his culture grew rare growth of Pantoea species an MR moderate growth of Candida  parapsilosis. it is sensitive to levofloxacin. He has not heard back from the insurance company regarding his hyperbaric oxygen therapy. His MRI has not been done yet and we will try and get him an earlier date 01/28/2015 -- MRI was done last night -- IMPRESSION:1. Soft tissue ulcer overlying the plantar aspect of the fifth metatarsal head extending to the cortex. Subcortical marrow edema in the fifth metatarsal head with corresponding T1 hypointensity is concerning for early osteomyelitis of the plantar lateral aspect of the fifth metatarsal head. Chest x-ray done on 01/14/2015 shows bronchiectatic changes without infiltrate. EKG done on generally 17 2017 shows a normal sinus rhythm and is a normal EKG. 02/04/2015 -- he was asked to see Dr. [name] last week and had 2 appointments but had to cancel both due to pressures of work. Last night he has woken up with severe pain in the foot and leg and it is swollen up. No fever or no change in his blood glucose. Addendum: I spoke with Dr. [name] who kindly agreed to accept the patient for inpatient therapy and have also opened to the hospitalist Dr. [name], and discuss details of the management including PICC line and repeat cultures. 02/12/2015-- -- was seen by Dr. [name] in the hospital and a PICC line was placed. He was to receive Ceftazidime 2 g every 12 hourly, oral levofloxacin 750 mg every 24 hourly and oral fluconazole 200 mg daily. The antibiotics were to be given for 4 weeks except the Diflucan was to be given for the first 2 weeks. Reviewed note from 02/10/2015 -- and Dr.  [name] had recommended management for growth of MSSA and Serratia. He switched him from ceftazidime to ceftriaxone 2 g every 24 hours. Levofloxacin was stopped and he would continue on fluconazole for another week. He had asked me to decide whether further imaging was necessary and whether surgical debridement of the infected bone was needed. He is doing well and has been off work for this week and we will keep him off the next week. 02/22/2015 -- he was seen by my colleague on 01/19/2015 and at that time an incision and drainage was done on his right lateral forefoot on the dorsum. Today when I probed this wound it is frankly draining pus and it communicates with the ulcer on the plantar aspect of his right foot. The patient is still on IV ceftriaxone 2 g every 24 hours and is to be seen by Dr. [name] on Friday. 03/19/2015 -- On 03/04/2015 I spoke to Dr. 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 He recommended operative debridement which would probably result in the fifth metatarsal head and toe amputation.The patient would be referred back to [place] once he was done with surgery. He was admitted to [facility] yesterday and had surgery done by podiatry for a right fifth metatarsal acute osteomyelitis with cellulitis and abscess. He had a right foot incision and drainage with fifth metatarsal partial amputation and removal of toe infected bone and soft tissue with cultures. The wound was partially closed and packing of the distal end was done. Patient was already on cefepime 2 g IV every 8 hourly and put on vancomycin pending final cultures. He had grown moderate gram-negative rods, and later found to be rare diphtheroids. I received a call from Dr. Cannon Kettle  the podiatrist and we discussed the above. On 03/10/2015 he was found  positive for influenza a and has been put on Tamiflu. Since his discharge he has been seen by Dr. Cannon Kettle who is planning to remove his sutures this coming week. He was reviewed by Dr. [name] on 03/17/2015 and his Diflucan was stopped and vancomycin. After the 3 doses he is taking. He is going to change Ceftazidime to Zosyn 3.375 g IV every 8 hours. 03/25/2015 -- he was seen by the podiatrist a couple of days ago and the sutures were removed. She will follow back with him in 4 weeks' time and at that time x-rays will be taken and a custom molded insert would be made for his shoe. 04/01/2015 -- he was seen by Dr. [name] on 03/29/2015 who pulled the PICC line stopped his IV antibiotics and recommended starting doxycycline and levofloxacin for 2 weeks. He also stop the fluconazole. The patient will follow up with him only when necessary. Readmission: 03/15/17 on evaluation today patient presents for initial evaluation concerning the new issues although he has previously been evaluated in our clinic. Unfortunately the previous evaluation led to the patient having to proceed to amputation and so he is somewhat nervous about being here [name], [name] ([id number]) today. With that being said he has a very slight blister that occurred on the plantar aspect more medial on the right great toe that has been present for just a very short amount of time, several days. With that being said he felt like initially when he called that he somewhat overreacted but due to the fact that his previous issue led to amputation he is very cautious these days I explained that he did the right thing. With that being said he has been tolerating the dressing changes without complication mainly he just been covering this. He does not have any discomfort in secondary to neuropathy it's unlikely that he feels much. With that being said he does tell me that the issue that he had here is that he went barefoot when he  knows he should not have which subsequently led to the blisters. He states is definitely not doing that anymore. His most recent hemoglobin A1c was December and registered 6.9 his ABI today was 1.1. 03/27/17 on evaluation today patient appears to be doing great in regard to his right great toe ulcer. He has been tolerating the dressing changes without complication. The good news is this is making excellent progress and he seems to be caring for this in an excellent fashion. I see no evidence of breakdown that would make me concerned that he was at risk for infection/amputation. This has obviously been his concern due to the fifth toe amputation that was necessitated previous when he had a similar issue. Nonetheless this seems to be progressing much more nicely. Readmission: 03/13/2019 upon evaluation today patient presents for reevaluation here in the clinic concerning issues he has been having with his right foot on the lateral portion at the proximal end of the metatarsal. Subsequently he tells me that this just opened up in the past couple of days or so and the erythema really began about 24 to 48 hours ago. Fortunately there is no signs of systemic infection. 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 That left him with this bony prominence that is where the wound is currently. There is erythema surrounding that does have me concerned about cellulitis. With that being  said he was noncompressible as far as ABIs are concerned I do think we can need to check into arterial studies at this point. Patient does have a history of hypertension along with diabetes mellitus type 2. He also tends to develop quite a bit of callus white often. 03/20/2019 upon evaluation today patient appears to be doing very well in regard to his wounds currently. He has been tolerating the dressing changes without complication. Fortunately there is no signs of infection. His ABIs  were good at this point and registered at 1.07 on the left and 1.09 on the right. In regard to the x-ray this was negative as well for any signs of osteomyelitis. The patient also seems to be doing better in regard to the infection. He still has several days of the antibiotic left at this point best the Bactrim DS and he seems to be doing very well with this. Overall I am extremely pleased with the progress in 1 week's time he is going require some sharp debridement today however. 03/27/2019 upon evaluation today patient actually appears to be making some progress in regard to his wound. It is a little deeper but measuring smaller which is good news due to the fact that again were clear away some of the necrotic tissue which is why the depth is increasing a little bit. Nonetheless after like were getting very close to being down at a good wound bed. Fortunately there is no signs of active infection at this time. There is a little bit of erythema immediately surrounding the wound that we do want to be very cognizant of and careful about. For that reason I am going to go ahead and extend his antibiotic today for an additional 10 days that is the Bactrim. 04/03/2019 upon evaluation today patient appears to be making some progress. I do feel like the Annitta Needs is doing a good job along with the alginate is being packed into the wound space behind. He has been tolerating the dressing changes without complication. Fortunately there is no signs of active infection at this time. No fevers, chills, nausea, vomiting, or diarrhea. 04/10/2019 upon evaluation today patient appears to be making progress. The wound is not quite as deep but he still does not have a great wound surface yet for working on this and the Santyl does seem to be cleaning things up. Fortunately there is no evidence of active infection at this time. No fevers, chills, nausea, vomiting, or diarrhea. 04/17/2019 upon evaluation today patient appears to  be doing excellent at this time in regard to his foot ulcer. I do believe the Santyl with the alginate packed in behind has been of benefit for him and I am very pleased in this regard. With that being said I am feeling like the wound surface is improving quite a bit each time I see him still I do not believe we are at the point of stating that the wound bed is perfect but again were making progress. The patient is going require some sharp debridement today. 4/22; this is a patient who is using Santyl with calcium alginate. Apparently his wound dimensions have been improving. Patient is changing the dressing himself. He has a surgical shoe. He works in a sitting position therefore is not on his feet all that much. There is undermining 05/01/2019 upon evaluation today patient appears to be doing a little better in regard to the size of his wound though he still has some depth. This does seem to be  much cleaner has been using Santyl on the base of the wound followed by packing with silver alginate and behind. Nonetheless I do believe that based on what we are seeing we may need to switch to a collagen type dressing endoform may actually be excellent for him. 05/08/2019 upon evaluation today patient's wound actually appears to be showing better granulation tissue in the base of the wound. With that being said he does have some epiboly noted around the edges of the wound especially plantar and more distal. Subsequently this is can require sharp debridement today. Fortunately there is no signs of active systemic infection though I do feel like there is some local cellulitis noted at this point. The patient also notes has had increased drainage. 5/13; wound volume quite a bit improved this week down to 3 mm. Patient states pain and drainage better. Culture from last week grew staph aureus [not MRSA] and this is sensitive to quinolones and he is on Levaquin. HOWEVER he also grew abundant Enterococcus faecalis  treatment of choice for this is ampicillin. Quinolone coverage is unreliable 5/20 completing the Augmentin. Small wound on the right lateral foot. Thick rolled edges around the wound. Using silver alginate 05/29/2019 upon evaluation today patient appears to be doing better compared to last time I saw him. Fortunately there is no signs of active infection and I do feel like he is actually making good progress. Overall the quality of the wound bed along with the size looks improved and the inflammation/erythema that was previously noted by myself when I saw the couple weeks back actually has also resolved and this is great. Overall very pleased with how things stand. 06/06/2019 upon evaluation today patient actually appears to be doing quite well with regard to his foot ulcer with regard to some of the granulation were seen at this point. There does not appear to be any evidence of systemic or local infection although he still has discomfort I feel like things are moving in a slow but surely correct direction. I think we may need to help fill some of the space however even though he is using the collagen we may need to have something behind this such as a plain packing strip to try to help hold the collagen in the base of the wound. 06/12/2019 upon evaluation today patient appears to be doing well with regard to his foot ulcer. This seems to be making some slow but steady progress. The wound does not appear to be as deep today compared to where it was previous. Fortunately there is no signs of active infection at this time. No fevers, chills, nausea, vomiting, or diarrhea. 06/19/2019 on evaluation today patient's wound does not appear to be doing quite as well as what I would like to see. Fortunately there is no signs of systemic infection at this time which is great news. With that being said I do believe that the patient needs to have a wound culture as I do BRYCESON, GRAPE. ([id number]) believe he  likely has a local infection that needs to be addressed. He is also can require some debridement today as well. 06/26/2019 upon evaluation today patient's wound actually appears to be showing signs of excellent improvement today. There does not appear to be any evidence of active infection which is great news. There does not appear to be significant erythema at this point. The patient does have improvement overall in the appearance of the infection he has been taking the Augmentin the culture came  back showing no organism was grown at this point but nonetheless I do believe he has been doing well with the antibiotics I would continue that at this point. 07/03/2019 upon evaluation today patient appears to be doing about the same in regard to his wound. There does not appear to be any evidence of active infection and overall I am pleased with where things stand. The patient is tolerating the dressing changes without complication but it does not appear that they are packing the wound at this time which I think is going to slow down his healing prospects. 07/10/2019 upon evaluation today patient appears to be doing little better in my opinion in regard to the overall moisture collected in the base of the wound. They have been packing this with a little bit of silver alginate and that has done quite well. Overall I am pleased in that regard. Subsequently the redness does not appear to be any worse today I did actually go ahead and marked this since this can be 2 weeks between now and when I see him again I would make sure that if anything changes or worsens he will be able to notice this and go ahead and initiate treatment with the Augmentin. 07/22/2019 patient comes in today a little bit early for evaluation secondary to the fact that he was having issues with increased drainage and concern about infection. With that being said he does appear to have developed a blister area again proximal to where the original  wound opening was in although the wound bed itself actually appears to be doing somewhat better he has had increased drainage and tracking of fluid here due to this blister. Nonetheless I believe that that does need to be cleaned away today but again I am not really certain that I see a lot of evidence for infection at this point. 07/29/2019 on evaluation today patient actually appears to be doing better in regard to his foot ulcer. There is improvement compared to last week which is great news. Nonetheless he does come prepared to apply the total contact cast today which I think would be beneficial for him. He knows he is not supposed to drive he did bring his son with him today in order to drive him. 07/31/2019 upon evaluation today patient appears to be doing well with regard to his wound today. I do not see any signs of infection I do believe that he is definitely showing some signs of improvement here in the office in a couple days as far as the overall erythema and pressure getting to the wound area is concerned. With that being said I do believe that the cast has been of great benefit for him. 08/08/2019 upon evaluation today patient appears to be doing better in regard to his wound. Overall I feel like he is showing signs of improvement. The external measurement is really not bad at all although I did remove some of the callus in order to open up the wound itself the depth is actually coming quite well and I am extremely pleased in that regard I do not think we can to see much improvement externally until we get the internal depth filled in and that again is improved. Fortunately there is no signs of active infection at this time. No fevers, chills, nausea, vomiting, or diarrhea. 08/15/2019 on evaluation today patient actually appears to be doing well with regard to his foot ulcer. This is showing some signs of minimal improvement from the standpoint of  the depth and appearance of the wound as  well as the edges of the wound. It is taking its time and to be honest I do believe that he seems to be progressing well. I am happy with the cast and what it is achieving at this point. Have contemplated checking into Dermagraft for Apligraf for the patient but again he does seem to be making fairly good progress in my opinion. If it has not tremendously changed come next week I may consider one of the 2. 08/22/2019 on evaluation today patient appears to be making good progress with regard to his wound I am still very pleased with where things stand today. Overall I think that he is making good improvements week by week and I believe that we are getting continue with the Total contact cast I will likely switch to a silver alginate dressing. 08/29/2019 on evaluation today patient appears to be doing quite well with regard to his wound. Fortunately this is measuring quite small. There is no signs of active infection at this time although he did have a little bit of a callus area just to the side of his foot where we previously noted some fluid collecting underneath that was the case today as well. I did have to remove this. With that being said overall he seems to be doing quite well. 09/05/19 on evaluation today patient appears to be doing very well with regard to his original wound which is actually very small on evaluation today. With that being said he does have a blistered area that's more proximal to where this wound is that has been a little bit more concerned. Fortunately there is no signs of active infection at this time. I'm going to need to remove the blister tissue today. 10 09/12/2019 on evaluation today patient actually appears to be doing much better in regard to his wound. I think he is actually improved more without the cast that he was prior with the cast. Overall very pleased with where things stand today. 09/22/2019 upon evaluation today patient appears to be doing very well at this time  in regard to his foot ulcer. In fact I feel like he is continue to make good progress. I see no signs right now that there is any active infection which is great news. Also see very little evidence of pressure he does have some callus buildup but again this seems to be minimal. 09/29/2019 upon evaluation today patient appears to be doing well with regard to his foot ulcer. He has been tolerating the dressing changes without complication. Fortunately there is no signs of active infection at this time. No fevers, chills, nausea, vomiting, or diarrhea. 10/28/2019 upon evaluation today patient appears to be doing a little bit more poorly than last time I saw him as last time we assumed he was completely healed. Apparently that was not the case or else this is reopened but I tend to believe that this probably has never completely closed internally though it appeared to be. He has been using the offloading felt which seems to have done well there is no signs of redness no evidence of erythema all of which is good news. Nonetheless I am concerned about the fact that he is having issues here with what appears to be a reopening of the wound. 11/04/2019 upon evaluation today patient's wound actually showed signs of fairly good improvement today which is great news the wound bed does not appear to be significantly callus covered and also  does not appear to show any signs of significant infection or really any infection whatsoever. I am very pleased in this regard. In general I think that he is making good progress although this is a lot slower than he would like to see I think we are headed back in the correct direction. He is continuing to use the offloading felt. 11/18/2019 on evaluation today patient appears to be having some callus buildup and cracking that occurred at the wound site. Subsequently actually did have to perform a fairly significant debridement on the area to clean away some of the callus buildup  he tolerated that today with some bleeding with that being said I do feel like the wound appear to be doing much better post debridement. [name], [name] ([id number]) 11/25/2019 upon evaluation today patient appears to be doing somewhat poorly in regard to his foot ulcer. Unfortunately he is having some issues with cellulitis along the lateral aspect of his foot. Unfortunately he seems to be having some issues today with infection/cellulitis in regard to the foot. It still towards the proximal/dorsal surface of where the wound is and this subsequently is the area that always seems to cause him trouble. 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 I do believe we may need to look into this further to ensure that there is nothing more significant going on considering that he seems to have an ongoing issue with this at this point. 12/02/2019 upon evaluation today patient appears to be doing better in regard to his foot ulcer. This is great news. Fortunately there is no signs of active infection systemically which is also excellent. He has been tolerating the dressing changes without complication. With that being said he does tell me that the Augmentin has been causing him some trouble as far as headaches are concerned since he is been taking it. Is unsure if it is directly related or not. Nonetheless as I explained to him it is possible that this could be something that he is experiencing as a result of the antibiotic. The good news is there are other options that we could switch him to that would work equally well while at the same time seeing if indeed the antibiotic was causing the headaches. He does want to proceed with that. 12/09/2019 on evaluation today patient's wound actually showing some signs of improvement. Fortunately there is no signs of active infection at this time great news. No fever chills noted. He has been using the alginate which I think has done well for him up to this point.  No fevers, chills, nausea, vomiting, or diarrhea. 12/18/2019 on evaluation today patient's wound is actually showing signs of fairly good granulation at this point and epithelization. He does have some callus still covering over the little tunnel off to the side that still seems to be staying open despite the fact that we have been trying to keep the callus away from this is much as possible. Fortunately there is no signs of active infection systemically at this time overall I feel like the patient is making good progress in that regard. There is no erythema whatsoever noted. With that being said were still trying to check into MRI in where we stand in that regard. Apparently has been approved but not scheduled. Electronic Signature(s) Signed: 12/18/2019 4:18:50 PM By: [name] PA-C Entered By: [name] on 12/18/2019 16:18:50 [name] ([id number]) -------------------------------------------------------------------------------- Physical Exam Details Patient Name: [name], [name] Date of Service: 12/18/2019 3:30  PM Medical Record Number: [id number] Patient Account Number: [id number] Date of Birth/Sex: September 06, 1965 (54 y.o. M) Treating RN: Cornell Barman Primary Care Provider: Berneta Sages Other Clinician: Referring Provider: Berneta Sages Treating Provider/Extender: Skipper Cliche in Treatment: 22 Constitutional Well-nourished and well-hydrated in no acute distress. Respiratory normal breathing without difficulty. Psychiatric this patient is able to make decisions and demonstrates good insight into disease process. Alert and Oriented x 3. pleasant and cooperative. Notes Upon inspection patient's wound bed actually showed signs of good granulation at this time. There does not appear to be any evidence of active infection which is great news I did have to perform some sharp debridement to remove callus from the surface of the wound exposing the small tracking tunnel  towards the lateral portion of his foot. Again this is the same region that we have previously been taking care of and it does seem to be getting smaller but nonetheless we do not want this to trapped fluid ongoing as it has in the past causing a bigger infection. Electronic Signature(s) Signed: 12/18/2019 4:19:23 PM By: [name] PA-C Entered By: [name] on 12/18/2019 16:19:23 [name] ([id number]) -------------------------------------------------------------------------------- Physician Orders Details Patient Name: [name] Date of Service: 12/18/2019 3:30 PM Medical Record Number: [id number] Patient Account Number: [id number] Date of Birth/Sex: 1965-12-01 (54 y.o. 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 - in office Anesthetic (add to Medication List) Wound #4R Right,Lateral Foot o Topical Lidocaine 4% cream applied to wound bed prior to debridement (In Clinic Only). Primary Wound Dressing Wound #4R Right,Lateral Foot o Silver Alginate Secondary Dressing Wound #4R Right,Lateral Foot o Conform/Kerlix - Offloading felt donut, dry gauze, roll gauze to cover Dressing Change Frequency Wound #4R Right,Lateral Foot o Change Dressing Monday, Wednesday, Friday Follow-up Appointments Wound #4R Right,Lateral Foot o Return Appointment in 1 week. Off-Loading Wound #4R  Right,Lateral Foot o Open toe surgical shoe - keep pressure off foot as much as possible. Electronic Signature(s) Signed: 12/18/2019 4:09:05 PM By: Gretta Cool, BSN, RN, CWS, Kim RN, BSN Signed: 12/18/2019 4:55:27 PM By: [name] PA-C Entered By: Gretta Cool BSN, RN, CWS, Kim on 12/18/2019 16:09:04 [name], [name] ([id number]) -------------------------------------------------------------------------------- Problem List Details Patient Name: [name], [name] Date of Service: 12/18/2019 3:30 PM Medical Record Number: [id number] Patient Account Number: [id number] Date of Birth/Sex: June 03, 1965 (54 y.o. 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 Date of Service: 12/18/2019 3:30 PM Medical Record Number: [id number] Patient Account Number: [id number] Date of Birth/Sex: 06-Jan-1965 (54 y.o. M) Treating RN: Cornell Barman Primary Care Provider: Berneta Sages Other Clinician: Referring Provider: Berneta Sages Treating Provider/Extender: Skipper Cliche in Treatment:  40 Subjective Chief Complaint Information obtained from Patient Right lateral foot ulcer History of Present Illness (HPI) This 54 year old male comes with an ulcerated area on the plantar aspect of the right foot which she's had for approximately a month. I have known him from a previous visit at [facility] wound center and was treated in the months of April and May 2016 and rapidly healed a left plantar ulcer with a total contact cast. He has been a diabetic for about 16 years and tries to keep active and is fairly compliant with his diabetes management. 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 Past medical history significant for hypertension, hyperlipidemia, and status post appendectomy 1993. He does not smoke or drink alcohol. 01/14/2015 -- the patient had tolerated his total contact cast very well and had no problems and has had no systemic symptoms. However when his total contact cast was cut open he had excessive amount of purulent drainage in spite of being on antibiotics. He had had a recent x-ray done in the ER 12/22/2014 which showed IMPRESSION:No evidence of osseous erosion. Known soft tissue ulceration is not well characterized on radiograph. Scattered vascular calcifications seen. his last hemoglobin A1c in December was 7.3. He has been on Augmentin and doxycycline for the last 2 weeks. 01/21/2015 -- his culture grew rare growth of Pantoea species an MR moderate growth of Candida parapsilosis. it is sensitive to levofloxacin. He has not heard back from the insurance company regarding his hyperbaric oxygen therapy. His MRI has not been done yet and we will try and get him an earlier date 01/28/2015 -- MRI was done last night -- IMPRESSION:1. Soft tissue ulcer overlying the plantar aspect of the fifth metatarsal head extending to the cortex. Subcortical marrow edema in the fifth metatarsal head with corresponding T1 hypointensity is concerning for early osteomyelitis of the  plantar lateral aspect of the fifth metatarsal head. Chest x-ray done on 01/14/2015 shows bronchiectatic changes without infiltrate. EKG done on generally 17 2017 shows a normal sinus rhythm and is a normal EKG. 02/04/2015 -- he was asked to see Dr. [name] last week and had 2 appointments but had to cancel both due to pressures of work. Last night he has woken up with severe pain in the foot and leg and it is swollen up. No fever or no change in his blood glucose. Addendum: I spoke with Dr. [name] who kindly agreed to accept the patient for inpatient therapy and have also opened to the hospitalist Dr. [name], and discuss details of the management including PICC line and repeat cultures. 02/12/2015-- -- was seen by Dr. [name] in the hospital and a PICC line was placed. He was to receive Ceftazidime 2 g every 12 hourly, oral levofloxacin 750 mg every 24 hourly and oral fluconazole 200 mg daily. The antibiotics were to be given for 4 weeks except the Diflucan was to be given for the first 2 weeks. Reviewed note from 02/10/2015 -- and Dr. [name] had recommended management for growth of MSSA and Serratia. He switched him from ceftazidime to ceftriaxone 2 g every 24 hours. Levofloxacin was stopped and he would continue on fluconazole  for another week. He had asked me to decide whether further imaging was necessary and whether surgical debridement of the infected bone was needed. He is doing well and has been off work for this week and we will keep him off the next week. 02/22/2015 -- he was seen by my colleague on 01/19/2015 and at that time an incision and drainage was done on his right lateral forefoot on the dorsum. Today when I probed this wound it is frankly draining pus and it communicates with the ulcer on the plantar aspect of his right foot. The patient is still on IV ceftriaxone 2 g every 24 hours and is to be seen by Dr. [name] on Friday. 03/19/2015 -- On 03/04/2015 I spoke to  Dr. 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 He recommended operative debridement which would probably result in the fifth metatarsal head and toe amputation.The patient would be referred back to [place] once he was done with surgery. He was admitted to [facility] yesterday and had surgery done by podiatry for a right fifth metatarsal acute osteomyelitis with cellulitis and abscess. He had a right foot incision and drainage with fifth metatarsal partial amputation and removal of toe infected bone and soft tissue with cultures. The wound was partially closed and packing of the distal end was done. Patient was already on cefepime 2 g IV every 8 hourly and put on vancomycin pending final cultures. He had grown moderate gram-negative rods, and later found to be rare diphtheroids. I received a call from Dr. Cannon Kettle the podiatrist and we discussed the above. On 03/10/2015 he was found positive for influenza a and has been put on Tamiflu. Since his discharge he has been seen by Dr. Cannon Kettle who is planning to remove his sutures this coming week. He was reviewed by Dr. [name] on 03/17/2015 and his Diflucan was stopped and vancomycin. After the 3 doses he is taking. He is going to change Ceftazidime to Zosyn 3.375 g IV every 8 hours. 03/25/2015 -- he was seen by the podiatrist a couple of days ago and the sutures were removed. She will follow back with him in 4 weeks' time and at that time x-rays will be taken and a custom molded insert would be made for his shoe. 04/01/2015 -- he was seen by Dr. [name] on 03/29/2015 who pulled the PICC line stopped his IV antibiotics and recommended starting doxycycline and levofloxacin for 2 weeks. He also stop the fluconazole. The patient will follow up with him only when necessary. [name], [name] ([id number]) Readmission: 03/15/17 on evaluation today patient presents for initial evaluation concerning the new issues although he has previously been evaluated in our clinic. Unfortunately the previous evaluation led to the patient having to proceed to amputation and so he is somewhat nervous about being here today. With that being said he has a very slight blister that occurred on the plantar aspect more medial on the right great toe that has been present for just a very short amount of time, several days. With that being said he felt like initially when he called that he somewhat overreacted but due to the fact that his previous issue led to amputation he is very cautious these days I explained that he did the right thing. With that being said he has  been tolerating the dressing changes without complication mainly he just been covering this. He does not have any discomfort in secondary to neuropathy it's unlikely that he feels much. With that being said he does tell me that the issue that he had here is that he went barefoot when he knows he should not have which subsequently led to the blisters. He states is definitely not doing that anymore. His most recent hemoglobin A1c was December and registered 6.9 his ABI today was 1.1. 03/27/17 on evaluation today patient appears to be doing great in regard to his right great toe ulcer. He has been tolerating the dressing changes without complication. The good news is this is making excellent progress and he seems to be caring for this in an excellent fashion. I see no evidence of breakdown that would make me concerned that he was at risk for infection/amputation. This has obviously been his concern due to the fifth toe amputation that was necessitated previous when he had a similar issue. Nonetheless this seems to be progressing much more nicely. Readmission: 03/13/2019 upon evaluation today patient presents for reevaluation here in the clinic  concerning issues he has been having with his right foot on the lateral portion at the proximal end of the metatarsal. Subsequently he tells me that this just opened up in the past couple of days or so and the erythema really began about 24 to 48 hours ago. Fortunately there is no signs of systemic infection. 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 That left him with this bony prominence that is where the wound is currently. There is erythema surrounding that does have me concerned about cellulitis. With that being said he was noncompressible as far as ABIs are concerned I do think we can need to check into arterial studies at this point. Patient does have a history of hypertension along with diabetes mellitus type 2. He also tends to develop quite a bit of callus white often. 03/20/2019 upon evaluation today patient appears to be doing very well in regard to his wounds currently. He has been tolerating the dressing changes without complication. Fortunately there is no signs of infection. His ABIs were good at this point and registered at 1.07 on the left and 1.09 on the right. In regard to the x-ray this was negative as well for any signs of osteomyelitis. The patient also seems to be doing better in regard to the infection. He still has several days of the antibiotic left at this point best the Bactrim DS and he seems to be doing very well with this. Overall I am extremely pleased with the progress in 1 week's time he is going require some sharp debridement today however. 03/27/2019 upon evaluation today patient actually appears to be making some progress in regard to his wound. It is a little deeper but measuring smaller which is good news due to the fact that again were clear away some of the necrotic tissue which is why the depth is increasing a little bit. Nonetheless after like were getting very close to being down at a good wound bed. Fortunately  there is no signs of active infection at this time. There is a little bit of erythema immediately surrounding the wound that we do want to be very cognizant of and careful about. For that reason I am going to go ahead and extend his antibiotic today for an additional 10 days that is the Bactrim. 04/03/2019  upon evaluation today patient appears to be making some progress. I do feel like the Annitta Needs is doing a good job along with the alginate is being packed into the wound space behind. He has been tolerating the dressing changes without complication. Fortunately there is no signs of active infection at this time. No fevers, chills, nausea, vomiting, or diarrhea. 04/10/2019 upon evaluation today patient appears to be making progress. The wound is not quite as deep but he still does not have a great wound surface yet for working on this and the Santyl does seem to be cleaning things up. Fortunately there is no evidence of active infection at this time. No fevers, chills, nausea, vomiting, or diarrhea. 04/17/2019 upon evaluation today patient appears to be doing excellent at this time in regard to his foot ulcer. I do believe the Santyl with the alginate packed in behind has been of benefit for him and I am very pleased in this regard. With that being said I am feeling like the wound surface is improving quite a bit each time I see him still I do not believe we are at the point of stating that the wound bed is perfect but again were making progress. The patient is going require some sharp debridement today. 4/22; this is a patient who is using Santyl with calcium alginate. Apparently his wound dimensions have been improving. Patient is changing the dressing himself. He has a surgical shoe. He works in a sitting position therefore is not on his feet all that much. There is undermining 05/01/2019 upon evaluation today patient appears to be doing a little better in regard to the size of his wound though he still  has some depth. This does seem to be much cleaner has been using Santyl on the base of the wound followed by packing with silver alginate and behind. Nonetheless I do believe that based on what we are seeing we may need to switch to a collagen type dressing endoform may actually be excellent for him. 05/08/2019 upon evaluation today patient's wound actually appears to be showing better granulation tissue in the base of the wound. With that being said he does have some epiboly noted around the edges of the wound especially plantar and more distal. Subsequently this is can require sharp debridement today. Fortunately there is no signs of active systemic infection though I do feel like there is some local cellulitis noted at this point. The patient also notes has had increased drainage. 5/13; wound volume quite a bit improved this week down to 3 mm. Patient states pain and drainage better. Culture from last week grew staph aureus [not MRSA] and this is sensitive to quinolones and he is on Levaquin. HOWEVER he also grew abundant Enterococcus faecalis treatment of choice for this is ampicillin. Quinolone coverage is unreliable 5/20 completing the Augmentin. Small wound on the right lateral foot. Thick rolled edges around the wound. Using silver alginate 05/29/2019 upon evaluation today patient appears to be doing better compared to last time I saw him. Fortunately there is no signs of active infection and I do feel like he is actually making good progress. Overall the quality of the wound bed along with the size looks improved and the inflammation/erythema that was previously noted by myself when I saw the couple weeks back actually has also resolved and this is great. Overall very pleased with how things stand. 06/06/2019 upon evaluation today patient actually appears to be doing quite well with regard to his foot ulcer  with regard to some of the granulation were seen at this point. There does not appear to  be any evidence of systemic or local infection although he still has discomfort I feel like things are moving in a slow but surely correct direction. I think we may need to help fill some of the space however even though he is using the collagen we may need to have something behind this such as a plain packing strip to try to help hold the collagen in the base of the wound. 06/12/2019 upon evaluation today patient appears to be doing well with regard to his foot ulcer. This seems to be making some slow but steady progress. The wound does not appear to be as deep today compared to where it was previous. Fortunately there is no signs of active infection at [name], [name]. ([id number]) this time. No fevers, chills, nausea, vomiting, or diarrhea. 06/19/2019 on evaluation today patient's wound does not appear to be doing quite as well as what I would like to see. Fortunately there is no signs of systemic infection at this time which is great news. With that being said I do believe that the patient needs to have a wound culture as I do believe he likely has a local infection that needs to be addressed. He is also can require some debridement today as well. 06/26/2019 upon evaluation today patient's wound actually appears to be showing signs of excellent improvement today. There does not appear to be any evidence of active infection which is great news. There does not appear to be significant erythema at this point. The patient does have improvement overall in the appearance of the infection he has been taking the Augmentin the culture came back showing no organism was grown at this point but nonetheless I do believe he has been doing well with the antibiotics I would continue that at this point. 07/03/2019 upon evaluation today patient appears to be doing about the same in regard to his wound. There does not appear to be any evidence of active infection and overall I am pleased with where things stand. The  patient is tolerating the dressing changes without complication but it does not appear that they are packing the wound at this time which I think is going to slow down his healing prospects. 07/10/2019 upon evaluation today patient appears to be doing little better in my opinion in regard to the overall moisture collected in the base of the wound. They have been packing this with a little bit of silver alginate and that has done quite well. Overall I am pleased in that regard. Subsequently the redness does not appear to be any worse today I did actually go ahead and marked this since this can be 2 weeks between now and when I see him again I would make sure that if anything changes or worsens he will be able to notice this and go ahead and initiate treatment with the Augmentin. 07/22/2019 patient comes in today a little bit early for evaluation secondary to the fact that he was having issues with increased drainage and concern about infection. With that being said he does appear to have developed a blister area again proximal to where the original wound opening was in although the wound bed itself actually appears to be doing somewhat better he has had increased drainage and tracking of fluid here due to this blister. Nonetheless I believe that that does need to be cleaned away today but again I am  not really certain that I see a lot of evidence for infection at this point. 07/29/2019 on evaluation today patient actually appears to be doing better in regard to his foot ulcer. There is improvement compared to last week which is great news. Nonetheless he does come prepared to apply the total contact cast today which I think would be beneficial for him. He knows he is not supposed to drive he did bring his son with him today in order to drive him. 07/31/2019 upon evaluation today patient appears to be doing well with regard to his wound today. I do not see any signs of infection I do believe that he is  definitely showing some signs of improvement here in the office in a couple days as far as the overall erythema and pressure getting to the wound area is concerned. With that being said I do believe that the cast has been of great benefit for him. 08/08/2019 upon evaluation today patient appears to be doing better in regard to his wound. Overall I feel like he is showing signs of improvement. The external measurement is really not bad at all although I did remove some of the callus in order to open up the wound itself the depth is actually coming quite well and I am extremely pleased in that regard I do not think we can to see much improvement externally until we get the internal depth filled in and that again is improved. Fortunately there is no signs of active infection at this time. No fevers, chills, nausea, vomiting, or diarrhea. 08/15/2019 on evaluation today patient actually appears to be doing well with regard to his foot ulcer. This is showing some signs of minimal improvement from the standpoint of the depth and appearance of the wound as well as the edges of the wound. It is taking its time and to be honest I do believe that he seems to be progressing well. I am happy with the cast and what it is achieving at this point. Have contemplated checking into Dermagraft for Apligraf for the patient but again he does seem to be making fairly good progress in my opinion. If it has not tremendously changed come next week I may consider one of the 2. 08/22/2019 on evaluation today patient appears to be making good progress with regard to his wound I am still very pleased with where things stand today. Overall I think that he is making good improvements week by week and I believe that we are getting continue with the Total contact cast I will likely switch to a silver alginate dressing. 08/29/2019 on evaluation today patient appears to be doing quite well with regard to his wound. Fortunately this is  measuring quite small. There is no signs of active infection at this time although he did have a little bit of a callus area just to the side of his foot where we previously noted some fluid collecting underneath that was the case today as well. I did have to remove this. With that being said overall he seems to be doing quite well. 09/05/19 on evaluation today patient appears to be doing very well with regard to his original wound which is actually very small on evaluation today. With that being said he does have a blistered area that's more proximal to where this wound is that has been a little bit more concerned. Fortunately there is no signs of active infection at this time. I'm going to need to remove the blister tissue  today. 10 09/12/2019 on evaluation today patient actually appears to be doing much better in regard to his wound. I think he is actually improved more without the cast that he was prior with the cast. Overall very pleased with where things stand today. 09/22/2019 upon evaluation today patient appears to be doing very well at this time in regard to his foot ulcer. In fact I feel like he is continue to make good progress. I see no signs right now that there is any active infection which is great news. Also see very little evidence of pressure he does have some callus buildup but again this seems to be minimal. 09/29/2019 upon evaluation today patient appears to be doing well with regard to his foot ulcer. He has been tolerating the dressing changes without complication. Fortunately there is no signs of active infection at this time. No fevers, chills, nausea, vomiting, or diarrhea. 10/28/2019 upon evaluation today patient appears to be doing a little bit more poorly than last time I saw him as last time we assumed he was completely healed. Apparently that was not the case or else this is reopened but I tend to believe that this probably has never completely closed internally though it  appeared to be. He has been using the offloading felt which seems to have done well there is no signs of redness no evidence of erythema all of which is good news. Nonetheless I am concerned about the fact that he is having issues here with what appears to be a reopening of the wound. 11/04/2019 upon evaluation today patient's wound actually showed signs of fairly good improvement today which is great news the wound bed does not appear to be significantly callus covered and also does not appear to show any signs of significant infection or really any infection whatsoever. I am very pleased in this regard. In general I think that he is making good progress although this is a lot slower than he would like to see I think we are headed back in the correct direction. He is continuing to use the offloading felt. BRECK, [place] ([id number]) 11/18/2019 on evaluation today patient appears to be having some callus buildup and cracking that occurred at the wound site. Subsequently actually did have to perform a fairly significant debridement on the area to clean away some of the callus buildup he tolerated that today with some bleeding with that being said I do feel like the wound appear to be doing much better post debridement. 11/25/2019 upon evaluation today patient appears to be doing somewhat poorly in regard to his foot ulcer. Unfortunately he is having some issues with cellulitis along the lateral aspect of his foot. Unfortunately he seems to be having some issues today with infection/cellulitis in regard to the foot. It still towards the proximal/dorsal surface of where the wound is and this subsequently is the area that always seems to cause him trouble. 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 I do believe we may need to look into this further to ensure that there is nothing more significant going on considering that he seems to have an ongoing issue with this at this point. 12/02/2019 upon  evaluation today patient appears to be doing better in regard to his foot ulcer. This is great news. Fortunately there is no signs of active infection systemically which is also excellent. He has been tolerating the dressing changes without complication. With that being said he does tell me that the Augmentin has been  causing him some trouble as far as headaches are concerned since he is been taking it. Is unsure if it is directly related or not. Nonetheless as I explained to him it is possible that this could be something that he is experiencing as a result of the antibiotic. The good news is there are other options that we could switch him to that would work equally well while at the same time seeing if indeed the antibiotic was causing the headaches. He does want to proceed with that. 12/09/2019 on evaluation today patient's wound actually showing some signs of improvement. Fortunately there is no signs of active infection at this time great news. No fever chills noted. He has been using the alginate which I think has done well for him up to this point. No fevers, chills, nausea, vomiting, or diarrhea. 12/18/2019 on evaluation today patient's wound is actually showing signs of fairly good granulation at this point and epithelization. He does have some callus still covering over the little tunnel off to the side that still seems to be staying open despite the fact that we have been trying to keep the callus away from this is much as possible. Fortunately there is no signs of active infection systemically at this time overall I feel like the patient is making good progress in that regard. There is no erythema whatsoever noted. With that being said were still trying to check into MRI in where we stand in that regard. Apparently has been approved but not scheduled. Objective Constitutional Well-nourished and well-hydrated in no acute distress. Vitals Time Taken: 3:24 PM, Height: 74 in, Weight: 308  lbs, BMI: 39.5, Temperature: 98.6 F, Pulse: 85 bpm, Respiratory Rate: 16 breaths/min, Blood Pressure: 152/73 mmHg. Respiratory normal breathing without difficulty. Psychiatric this patient is able to make decisions and demonstrates good insight into disease process. Alert and Oriented x 3. pleasant and cooperative. General Notes: Upon inspection patient's wound bed actually showed signs of good granulation at this time. There does not appear to be any evidence of active infection which is great news I did have to perform some sharp debridement to remove callus from the surface of the wound exposing the small tracking tunnel towards the lateral portion of his foot. Again this is the same region that we have previously been taking care of and it does seem to be getting smaller but nonetheless we do not want this to trapped fluid ongoing as it has in the past causing a bigger infection. Integumentary (Hair, Skin) Wound #4R status is Open. Original cause of wound was Gradually Appeared. The wound is located on the Right,Lateral Foot. The wound measures 0.2cm length x 0.2cm width x 0.2cm depth; 0.031cm^2 area and 0.006cm^3 volume. There is Fat Layer (Subcutaneous Tissue) exposed. There is no tunneling or undermining noted. There is a small amount of serosanguineous drainage noted. The wound margin is flat and intact. There is medium (34-66%) red, pink granulation within the wound bed. There is a medium (34-66%) amount of necrotic tissue within the wound bed including Adherent Slough. Assessment Active Problems ICD-10 Type 2 diabetes mellitus with foot ulcer 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d Cellulitis of right lower limb Corns and callosities SONAM, HUELSMANN ([id number]) Essential (primary) hypertension Acquired absence of other left toe(s) Procedures Wound #4R Pre-procedure diagnosis of Wound #4R is a Diabetic Wound/Ulcer of the Lower  Extremity located on the Right,Lateral Foot .Severity of Tissue Pre Debridement is: Fat layer exposed. There  was a Excisional Skin/Subcutaneous Tissue Debridement with a total area of 0.98 sq cm performed by Tommie Sams., PA-C. With the following instrument(s): Curette to remove Viable and Non-Viable tissue/material. Material removed includes Callus, Subcutaneous Tissue, Slough, Skin: Dermis, and Skin: Epidermis. No specimens were taken. A time out was conducted at 15:50, prior to the start of the procedure. A Moderate amount of bleeding was controlled with Pressure. The procedure was tolerated well. Post Debridement Measurements: 0.7cm length x 1.4cm width x 0.5cm depth; 0.385cm^3 volume. Character of Wound/Ulcer Post Debridement is stable. Severity of Tissue Post Debridement is: Limited to breakdown of skin. Post procedure Diagnosis Wound #4R: Same as Pre-Procedure Plan Wound Cleansing: Wound #4R Right,Lateral Foot: Clean wound with Normal Saline. - in office Anesthetic (add to Medication List): Wound #4R Right,Lateral Foot: Topical Lidocaine 4% cream applied to wound bed prior to debridement (In Clinic Only). Primary Wound Dressing: Wound #4R Right,Lateral Foot: Silver Alginate Secondary Dressing: Wound #4R Right,Lateral Foot: Conform/Kerlix - Offloading felt donut, dry gauze, roll gauze to cover Dressing Change Frequency: Wound #4R Right,Lateral Foot: Change Dressing Monday, Wednesday, Friday Follow-up Appointments: Wound #4R Right,Lateral Foot: Return Appointment in 1 week. Off-Loading: Wound #4R Right,Lateral Foot: Open toe surgical shoe - keep pressure off foot as much as possible. 1. Would recommend currently that we going continue with the wound care measures as before. Fortunately there is no sign of active infection which is great news. 2. With regard to the MRI this is something you are still apparently working on it has been approved according to the patient he received  a letter from his insurance. With that being said we have not been able to get in touch with insurance to confirm this and get the approval number so this can be scheduled apparently. We will get a work on that. We will see patient back for reevaluation in 1 week here in the clinic. If anything worsens or changes patient will contact our office for additional recommendations. Electronic Signature(s) Signed: 12/18/2019 4:20:15 PM By: [name] PA-C Entered By: [name] on 12/18/2019 16:20:14 [name] ([id number]) -------------------------------------------------------------------------------- SuperBill Details Patient Name: [name] Date of Service: 12/18/2019 Medical Record Number: [id number] Patient Account Number: [id number] Date of Birth/Sex: August 15, 1965 (55 y.o. M) Treating RN: Cornell Barman Primary Care Provider: Berneta Sages Other Clinician: Referring Provider: Berneta Sages Treating Provider/Extender: Skipper Cliche in Treatment: 40 Diagnosis Coding ICD-10 Codes Code Description E11.621 Type 2 diabetes mellitus with foot ulcer L97.512 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d L03.115 Cellulitis of right lower limb L84 Corns and callosities I10 Essential (primary) hypertension Z89.422 Acquired absence of other left toe(s) Facility Procedures CPT4 Code: 79892119 Description: 41740 - DEB SUBQ TISSUE 20 SQ CM/< Modifier: Quantity: 1 CPT4 Code: Description: ICD-10 Diagnosis Description L97.512 Non-pressure chronic ulcer of other part of right foot with fat layer ex Modifier: posed Quantity: Physician Procedures CPT4 Code: 8144818 Description: 11042 - WC PHYS SUBQ TISS 20 SQ CM Modifier: Quantity: 1 CPT4 Code: Description: ICD-10 Diagnosis Description L97.512 Non-pressure chronic ulcer of other part of right foot with fat layer ex Modifier: posed Quantity: Electronic Signature(s) Signed: 12/18/2019  4:22:37 PM By: [name] PA-C Entered By: [name] on 12/18/2019 16:22:36

## 2019-12-19 NOTE — Progress Notes (Signed)
REISE, GLADNEY ([id number]) Visit Report for 12/18/2019 Arrival Information Details Patient Name: CATARINO, VOLD. Date of Service: 12/18/2019 3:30 PM Medical Record Number: [id number] Patient Account Number: [id number] Date of Birth/Sex: 1965-02-24 (54 y.o. M) Treating RN: Cornell Barman Primary Care Orest Dygert: Berneta Sages Other Clinician: Referring Tonantzin Mimnaugh: Berneta Sages Treating Jeannifer Drakeford/Extender: Skipper Cliche in Treatment: 77 Visit Information History Since Last Visit Added or deleted any medications: No Patient Arrived: Ambulatory Has Dressing in Place as Prescribed: Yes Arrival Time: 15:23 Has Footwear/Offloading in Place as Prescribed: Yes Accompanied By: self Right: Surgical Shoe with Pressure Relief Transfer Assistance: None Insole Patient Identification Verified: Yes Pain Present Now: No Secondary Verification Process Completed: Yes Patient Requires Transmission-Based Precautions: No Patient Has Alerts: Yes Patient Alerts: DMII Electronic Signature(s) Signed: 12/18/2019 5:16:15 PM By: Gretta Cool, BSN, RN, CWS, Kim RN, BSN Entered By: Gretta Cool, BSN, RN, CWS, Kim on 12/18/2019 15:24:20 [name] ([id number]) -------------------------------------------------------------------------------- Encounter Discharge Information Details Patient Name: KEIRON, IODICE. Date of Service: 12/18/2019 3:30 PM Medical Record Number: [id number] Patient Account Number: [id number] Date of Birth/Sex: 05/07/1965 (54 y.o. M) Treating RN: Cornell Barman Primary Care Shakura Cowing: Berneta Sages Other Clinician: Referring Myrle Wanek: Berneta Sages Treating Luvinia Lucy/Extender: Skipper Cliche in Treatment: 59 Encounter Discharge Information Items Post Procedure Vitals Discharge Condition: Stable Temperature (F): 98.6 Ambulatory Status: Ambulatory Pulse (bpm): 85 Discharge Destination: Home Respiratory Rate (breaths/min): 18 Transportation: Private Auto Blood Pressure (mmHg):  152/73 Accompanied By: self Schedule Follow-up Appointment: Yes Clinical Summary of Care: Electronic Signature(s) Signed: 12/18/2019 4:15:36 PM By: Gretta Cool, BSN, RN, CWS, Kim RN, BSN Entered By: Gretta Cool, BSN, RN, CWS, Kim on 12/18/2019 16:15:35 [name] ([id number]) -------------------------------------------------------------------------------- Lower Extremity Assessment Details Patient Name: AMERE, BRICCO. Date of Service: 12/18/2019 3:30 PM Medical Record Number: [id number] Patient Account Number: [id number] Date of Birth/Sex: 09-12-1965 (54 y.o. M) Treating RN: Cornell Barman Primary Care Calistro Rauf: Berneta Sages Other Clinician: Referring Tangela Dolliver: Berneta Sages Treating Wylma Tatem/Extender: Skipper Cliche in Treatment: 40 Edema Assessment Assessed: [Left: No] Patrice Paradise: Yes] [Left: Edema] [Right: :] Vascular Assessment Pulses: Dorsalis Pedis Palpable: [Right:Yes] Electronic Signature(s) Signed: 12/18/2019 5:16:15 PM By: Gretta Cool, BSN, RN, CWS, Kim RN, BSN Entered By: Gretta Cool, BSN, RN, CWS, Kim on 12/18/2019 15:31:13 KAYDAN, WONG ([id number]) -------------------------------------------------------------------------------- Multi Wound Chart Details Patient Name: [name] Date of Service: 12/18/2019 3:30 PM Medical Record Number: [id number] Patient Account Number: [id number] Date of Birth/Sex: 08/05/1965 (54 y.o. M) Treating RN: Cornell Barman Primary Care Kameron Blethen: Berneta Sages Other Clinician: Referring Jezabelle Chisolm: Berneta Sages Treating Kemoni Ortega/Extender: Skipper Cliche in Treatment: 40 Vital Signs Height(in): 74 Pulse(bpm): 53 Weight(lbs): 308 Blood Pressure(mmHg): 152/73 Body Mass Index(BMI): 40 Temperature(F): 98.6 Respiratory Rate(breaths/min): 16 Photos: [N/A:N/A] Wound Location: Right, Lateral Foot N/A N/A Wounding Event: Gradually Appeared N/A N/A Primary Etiology: Diabetic Wound/Ulcer of the Lower N/A N/A Extremity Comorbid History:  Hypertension, Type II Diabetes, N/A N/A Neuropathy Date Acquired: 03/12/2019 N/A N/A Weeks of Treatment: 40 N/A N/A Wound Status: Open N/A N/A Wound Recurrence: Yes N/A N/A Pending Amputation on Yes N/A N/A Presentation: Measurements L x W x D (cm) 0.2x0.2x0.2 N/A N/A Area (cm) : 0.031 N/A N/A Volume (cm) : 0.006 N/A N/A % Reduction in Area: 95.10% N/A N/A % Reduction in Volume: 95.30% N/A N/A Classification: Grade 1 N/A N/A Exudate Amount: Small N/A N/A Exudate Type: Serosanguineous N/A N/A Exudate Color: red, brown N/A N/A Wound Margin: Flat and Intact N/A N/A Granulation Amount: Medium (34-66%) N/A N/A Granulation Quality: Red, Pink N/A N/A Necrotic Amount:  Medium (34-66%) N/A N/A Exposed Structures: Fat Layer (Subcutaneous Tissue): N/A N/A Yes Fascia: No Tendon: No Muscle: No Joint: No Bone: No Epithelialization: Medium (34-66%) N/A N/A Treatment Notes Electronic Signature(s) Signed: 12/18/2019 4:06:33 PM By: Gretta Cool, BSN, RN, CWS, Kim RN, BSN Entered By: Gretta Cool, BSN, RN, CWS, Kim on 12/18/2019 16:06:32 ALEXANDROS, EWAN ([id number]) BRITTIN, JANIK ([id number]) -------------------------------------------------------------------------------- [place] Details Patient Name: PERKINS, MOLINA. Date of Service: 12/18/2019 3:30 PM Medical Record Number: [id number] Patient Account Number: [id number] Date of Birth/Sex: 02-12-1965 (54 y.o. M) Treating RN: Cornell Barman Primary Care Donnivan Villena: Berneta Sages Other Clinician: Referring Graysin Luczynski: Berneta Sages Treating Raymone Pembroke/Extender: Skipper Cliche in Treatment: 40 Active Inactive Necrotic Tissue Nursing Diagnoses: Impaired tissue integrity related to necrotic/devitalized tissue Knowledge deficit related to management of necrotic/devitalized tissue Goals: Necrotic/devitalized tissue will be minimized in the wound bed Date Initiated: 11/25/2019 Target Resolution Date: 12/11/2019 Goal Status:  Active Patient/caregiver will verbalize understanding of reason and process for debridement of necrotic tissue Date Initiated: 11/25/2019 Target Resolution Date: 12/11/2019 Goal Status: Active Interventions: Assess patient pain level pre-, during and post procedure and prior to discharge Treatment Activities: Excisional debridement : 11/25/2019 Notes: Nutrition Nursing Diagnoses: Impaired glucose control: actual or potential Goals: Patient/caregiver verbalizes understanding of need to maintain therapeutic glucose control per primary care physician Date Initiated: 03/13/2019 Target Resolution Date: 04/11/2019 Goal Status: Active Interventions: Assess patient nutrition upon admission and as needed per policy Notes: Orientation to the Wound Care Program Nursing Diagnoses: Knowledge deficit related to the wound healing center program Goals: Patient/caregiver will verbalize understanding of the [place] Program Date Initiated: 03/13/2019 Target Resolution Date: 04/11/2019 Goal Status: Active Interventions: Provide education on orientation to the wound center Notes: Wound/Skin Impairment Nursing Diagnoses: MOO, GRAVLEY ([id number]) Impaired tissue integrity Goals: Ulcer/skin breakdown will have a volume reduction of 30% by week 4 Date Initiated: 03/13/2019 Target Resolution Date: 04/11/2019 Goal Status: Active Interventions: Assess ulceration(s) every visit Notes: Electronic Signature(s) Signed: 12/18/2019 4:06:22 PM By: Gretta Cool, BSN, RN, CWS, Kim RN, BSN Entered By: Gretta Cool, BSN, RN, CWS, Kim on 12/18/2019 16:06:21 [name] ([id number]) -------------------------------------------------------------------------------- Pain Assessment Details Patient Name: [name] Date of Service: 12/18/2019 3:30 PM Medical Record Number: [id number] Patient Account Number: [id number] Date of Birth/Sex: 1965-01-29 (54 y.o. M) Treating RN: Cornell Barman Primary  Care Ximenna Fonseca: Berneta Sages Other Clinician: Referring Graysin Luczynski: Berneta Sages Treating Wess Baney/Extender: Skipper Cliche in Treatment: 40 Active Problems Location of Pain Severity and Description of Pain Patient Has Paino No Site Locations Pain Management and Medication Current Pain Management: Notes Patient denies pain at this time. Electronic Signature(s) Signed: 12/18/2019 5:16:15 PM By: Gretta Cool, BSN, RN, CWS, Kim RN, BSN Entered By: Gretta Cool, BSN, RN, CWS, Kim on 12/18/2019 15:25:12 [name] ([id number]) -------------------------------------------------------------------------------- Patient/Caregiver Education Details Patient Name: CUONG, MOORMAN Date of Service: 12/18/2019 3:30 PM Medical Record Number: [id number] Patient Account Number: [id number] Date of Birth/Gender: 06-18-1965 (54 y.o. M) Treating RN: Cornell Barman Primary Care Physician: Berneta Sages Other Clinician: Referring Physician: Berneta Sages Treating Physician/Extender: Skipper Cliche in Treatment: 53 Education Assessment Education Provided To: Patient Education Topics Provided Wound Debridement: Handouts: Wound Debridement Methods: Demonstration, Explain/Verbal Responses: State content correctly Wound/Skin Impairment: Handouts: Caring for Your Ulcer Methods: Demonstration, Explain/Verbal Responses: State content correctly Electronic Signature(s) Signed: 12/18/2019 5:16:15 PM By: Gretta Cool, BSN, RN, CWS, Kim RN, BSN Entered By: Gretta Cool, BSN, RN, CWS, Kim on 12/18/2019 16:09:40 [name] ([id number]) -------------------------------------------------------------------------------- Wound Assessment Details Patient Name: [name]  J. Date of Service: 12/18/2019 3:30 PM Medical Record Number: [id number] Patient Account Number: [id number] Date of Birth/Sex: November 28, 1965 (54 y.o. M) Treating RN: Cornell Barman Primary Care Lovina Zuver: Berneta Sages Other Clinician: Referring Yesena Reaves: Berneta Sages Treating Marquel Pottenger/Extender: Skipper Cliche in Treatment: 40 Wound Status Wound Number: 4R Primary Etiology: Diabetic Wound/Ulcer of the Lower Extremity Wound Location: Right, Lateral Foot Wound Status: Open Wounding Event: Gradually Appeared Comorbid History: Hypertension, Type II Diabetes, Neuropathy Date Acquired: 03/12/2019 Weeks Of Treatment: 40 Clustered Wound: No Pending Amputation On Presentation Photos Wound Measurements Length: (cm) 0.2 % Width: (cm) 0.2 % Depth: (cm) 0.2 Ep Area: (cm) 0.031 T Volume: (cm) 0.006 U Reduction in Area: 95.1% Reduction in Volume: 95.3% ithelialization: Medium (34-66%) unneling: No ndermining: No Wound Description Classification: Grade 1 Fo Wound Margin: Flat and Intact Sl Exudate Amount: Small Exudate Type: Serosanguineous Exudate Color: red, brown ul Odor After Cleansing: No ough/Fibrino Yes Wound Bed Granulation Amount: Medium (34-66%) Exposed Structure Granulation Quality: Red, Pink Fascia Exposed: No Necrotic Amount: Medium (34-66%) Fat Layer (Subcutaneous Tissue) Exposed: Yes Necrotic Quality: Adherent Slough Tendon Exposed: No Muscle Exposed: No Joint Exposed: No Bone Exposed: No Treatment Notes Wound #4R (Right, Lateral Foot) Notes silver alginate, dry gauze, offloading felt, conform ONAJE, WARNE ([id number]) Electronic Signature(s) Signed: 12/18/2019 5:16:15 PM By: Gretta Cool, BSN, RN, CWS, Kim RN, BSN Entered By: Gretta Cool, BSN, RN, CWS, Kim on 12/18/2019 15:30:20 [name] ([id number]) -------------------------------------------------------------------------------- Vitals Details Patient Name: [name] Date of Service: 12/18/2019 3:30 PM Medical Record Number: [id number] Patient Account Number: [id number] Date of Birth/Sex: 06/23/1965 (54 y.o. M) Treating RN: Cornell Barman Primary Care Clarine Elrod: Berneta Sages Other Clinician: Referring Louiza Moor: Berneta Sages Treating  Laiah Pouncey/Extender: Skipper Cliche in Treatment: 40 Vital Signs Time Taken: 15:24 Temperature (F): 98.6 Height (in): 74 Pulse (bpm): 85 Weight (lbs): 308 Respiratory Rate (breaths/min): 16 Body Mass Index (BMI): 39.5 Blood Pressure (mmHg): 152/73 Reference Range: 80 - 120 mg / dl Electronic Signature(s) Signed: 12/18/2019 5:16:15 PM By: Gretta Cool, BSN, RN, CWS, Kim RN, BSN Entered By: Gretta Cool, BSN, RN, CWS, Kim on 12/18/2019 15:24:43

## 2019-12-22 ENCOUNTER — Other Ambulatory Visit: Payer: Self-pay | Admitting: Physician Assistant

## 2019-12-22 DIAGNOSIS — E11621 Type 2 diabetes mellitus with foot ulcer: Secondary | ICD-10-CM

## 2019-12-22 DIAGNOSIS — L97512 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d: Secondary | ICD-10-CM

## 2019-12-26 ENCOUNTER — Ambulatory Visit: Payer: Managed Care, Other (non HMO) | Admitting: Physician Assistant

## 2019-12-26 ENCOUNTER — Encounter: Payer: Managed Care, Other (non HMO) | Admitting: Physician Assistant

## 2019-12-26 ENCOUNTER — Other Ambulatory Visit: Payer: Self-pay

## 2019-12-26 DIAGNOSIS — E11621 Type 2 diabetes mellitus with foot ulcer: Secondary | ICD-10-CM | POA: Diagnosis not present

## 2019-12-30 NOTE — Progress Notes (Signed)
MEDGAR, [name] ([id number]) Visit Report for 12/26/2019 Arrival Information Details Patient Name: [name], [name]. Date of Service: 12/26/2019 9:00 AM Medical Record Number: [id number] Patient Account Number: [id number] Date of Birth/Sex: Mar 09, 1965 (54 y.o. M) Treating RN: Dolan Amen Primary Care Jerran Tappan: Berneta Sages Other Clinician: Referring Evy Lutterman: Berneta Sages Treating Angelo Caroll/Extender: Skipper Cliche in Treatment: 62 Visit Information History Since Last Visit Pain Present Now: No Patient Arrived: Ambulatory Arrival Time: 08:58 Accompanied By: self Transfer Assistance: None Patient Identification Verified: Yes Secondary Verification Process Completed: Yes Patient Requires Transmission-Based Precautions: No Patient Has Alerts: Yes Patient Alerts: DMII Electronic Signature(s) Signed: 12/26/2019 2:32:18 PM By: Georges Mouse, Minus Breeding RN Entered By: Georges Mouse, Minus Breeding on 12/26/2019 08:58:45 [name] ([id number]) -------------------------------------------------------------------------------- Encounter Discharge Information Details Patient Name: [name] Date of Service: 12/26/2019 9:00 AM Medical Record Number: [id number] Patient Account Number: [id number] Date of Birth/Sex: 01-08-1965 (54 y.o. M) Treating RN: Dolan Amen Primary Care Jinelle Butchko: Berneta Sages Other Clinician: Referring Dorrell Mitcheltree: Berneta Sages Treating Jazmene Racz/Extender: Skipper Cliche in Treatment: 52 Encounter Discharge Information Items Post Procedure Vitals Discharge Condition: Stable Temperature (F): 97.9 Ambulatory Status: Ambulatory Pulse (bpm): 83 Discharge Destination: Home Respiratory Rate (breaths/min): 16 Transportation: Private Auto Blood Pressure (mmHg): 135/80 Accompanied By: self Schedule Follow-up Appointment: Yes Clinical Summary of Care: Electronic Signature(s) Signed: 12/26/2019 2:32:18 PM By: Georges Mouse, Minus Breeding RN Entered By:  Georges Mouse, Minus Breeding on 12/26/2019 09:29:53 [name] ([id number]) -------------------------------------------------------------------------------- Lower Extremity Assessment Details Patient Name: [name] Date of Service: 12/26/2019 9:00 AM Medical Record Number: [id number] Patient Account Number: [id number] Date of Birth/Sex: [birth year] (54 y.o. M) Treating RN: Dolan Amen Primary Care Laylamarie Meuser: Berneta Sages Other Clinician: Referring Jonai Weyland: Berneta Sages Treating Jabree Pernice/Extender: Jeri Cos Weeks in Treatment: 41 Edema Assessment Assessed: [Left: No] [Right: Yes] Edema: [Left: N] [Right: o] Electronic Signature(s) Signed: 12/26/2019 2:32:18 PM By: Georges Mouse, Minus Breeding RN Entered By: Georges Mouse, Minus Breeding on 12/26/2019 09:09:12 [name] ([id number]) -------------------------------------------------------------------------------- Multi Wound Chart Details Patient Name: [name] Date of Service: 12/26/2019 9:00 AM Medical Record Number: [id number] Patient Account Number: [id number] Date of Birth/Sex: 1965/03/11 (54 y.o. M) Treating RN: Dolan Amen Primary Care Eldine Rencher: Berneta Sages Other Clinician: Referring Renai Lopata: Berneta Sages Treating Lucas Winograd/Extender: Skipper Cliche in Treatment: 20 Vital Signs Height(in): 74 Pulse(bpm): 83 Weight(lbs): 308 Blood Pressure(mmHg): 135/80 Body Mass Index(BMI): 40 Temperature(F): 97.9 Respiratory Rate(breaths/min): 16 Photos: [N/A:N/A] Wound Location: Right, Lateral Foot N/A N/A Wounding Event: Gradually Appeared N/A N/A Primary Etiology: Diabetic Wound/Ulcer of the Lower N/A N/A Extremity Comorbid History: Hypertension, Type II Diabetes, N/A N/A Neuropathy Date Acquired: 03/12/2019 N/A N/A Weeks of Treatment: 41 N/A N/A Wound Status: Open N/A N/A Wound Recurrence: Yes N/A N/A Pending Amputation on Yes N/A N/A Presentation: Measurements L x W x D (cm) 0.4x0.5x0.2 N/A  N/A Area (cm) : 0.157 N/A N/A Volume (cm) : 0.031 N/A N/A % Reduction in Area: 75.30% N/A N/A % Reduction in Volume: 75.60% N/A N/A Classification: Grade 1 N/A N/A Exudate Amount: Small N/A N/A Exudate Type: Serosanguineous N/A N/A Exudate Color: red, brown N/A N/A Wound Margin: Thickened N/A N/A Granulation Amount: Large (67-100%) N/A N/A Granulation Quality: Red N/A N/A Necrotic Amount: Small (1-33%) N/A N/A Exposed Structures: Fat Layer (Subcutaneous Tissue): N/A N/A Yes Fascia: No Tendon: No Muscle: No Joint: No Bone: No Epithelialization: Small (1-33%) N/A N/A Treatment Notes Electronic Signature(s) Signed: 12/26/2019 2:32:18 PM By: Georges Mouse, Minus Breeding RN Entered By: Georges Mouse, Minus Breeding on 12/26/2019 09:11:50 [name]  [name] ([name] ([id number]) -------------------------------------------------------------------------------- [place] Details Patient Name: TABB, [name]. Date of Service: 12/26/2019 9:00 AM Medical Record Number: [id number] Patient Account Number: [id number] Date of Birth/Sex: Nov 30, 1965 (54 y.o. M) Treating RN: Dolan Amen Primary Care Summer Parthasarathy: Berneta Sages Other Clinician: Referring Vaunda Gutterman: Berneta Sages Treating Ceceilia Cephus/Extender: Skipper Cliche in Treatment: 41 Active Inactive Necrotic Tissue Nursing Diagnoses: Impaired tissue integrity related to necrotic/devitalized tissue Knowledge deficit related to management of necrotic/devitalized tissue Goals: Necrotic/devitalized tissue will be minimized in the wound bed Date Initiated: 11/25/2019 Target Resolution Date: 12/11/2019 Goal Status: Active Patient/caregiver will verbalize understanding of reason and process for debridement of necrotic tissue Date Initiated: 11/25/2019 Target Resolution Date: 12/11/2019 Goal Status: Active Interventions: Assess patient pain level pre-, during and post procedure and prior to  discharge Treatment Activities: Excisional debridement : 11/25/2019 Notes: Nutrition Nursing Diagnoses: Impaired glucose control: actual or potential Goals: Patient/caregiver verbalizes understanding of need to maintain therapeutic glucose control per primary care physician Date Initiated: 03/13/2019 Target Resolution Date: 04/11/2019 Goal Status: Active Interventions: Assess patient nutrition upon admission and as needed per policy Notes: Orientation to the Wound Care Program Nursing Diagnoses: Knowledge deficit related to the wound healing center program Goals: Patient/caregiver will verbalize understanding of the [place] Program Date Initiated: 03/13/2019 Target Resolution Date: 04/11/2019 Goal Status: Active Interventions: Provide education on orientation to the wound center Notes: Wound/Skin Impairment Nursing Diagnoses: NICKOLIS, YANIK ([id number]) Impaired tissue integrity Goals: Ulcer/skin breakdown will have a volume reduction of 30% by week 4 Date Initiated: 03/13/2019 Target Resolution Date: 04/11/2019 Goal Status: Active Interventions: Assess ulceration(s) every visit Notes: Electronic Signature(s) Signed: 12/26/2019 1:29:23 PM By: Gretta Cool, BSN, RN, CWS, Kim RN, BSN Signed: 12/26/2019 2:32:18 PM By: Georges Mouse, Minus Breeding RN Entered By: Gretta Cool, BSN, RN, CWS, Kim on 12/26/2019 13:29:23 TYREASE, VERBLE ([id number]) -------------------------------------------------------------------------------- Pain Assessment Details Patient Name: CHICK, FELGAR. Date of Service: 12/26/2019 9:00 AM Medical Record Number: [id number] Patient Account Number: [id number] Date of Birth/Sex: 05/07/1965 (54 y.o. M) Treating RN: Dolan Amen Primary Care Heyward Douthit: Berneta Sages Other Clinician: Referring Lataisha Colan: Berneta Sages Treating Sindy Mccune/Extender: Skipper Cliche in Treatment: 27 Active Problems Location of Pain Severity and Description of Pain Patient  Has Paino No Site Locations Rate the pain. Current Pain Level: 0 Pain Management and Medication Current Pain Management: Electronic Signature(s) Signed: 12/26/2019 2:32:18 PM By: Georges Mouse, Minus Breeding RN Entered By: Georges Mouse, Kenia on 12/26/2019 09:00:37 [name] ([id number]) -------------------------------------------------------------------------------- Patient/Caregiver Education Details Patient Name: [name] Date of Service: 12/26/2019 9:00 AM Medical Record Number: [id number] Patient Account Number: [id number] Date of Birth/Gender: 14-Apr-1965 (54 y.o. M) Treating RN: Cornell Barman Primary Care Physician: Berneta Sages Other Clinician: Referring Physician: Berneta Sages Treating Physician/Extender: Skipper Cliche in Treatment: 65 Education Assessment Education Provided To: Patient Education Topics Provided Wound/Skin Impairment: Handouts: Caring for Your Ulcer, Other: Continue wound care as prescribed Methods: Demonstration, Explain/Verbal Responses: State content correctly Electronic Signature(s) Signed: 12/30/2019 3:14:13 PM By: Gretta Cool, BSN, RN, CWS, Kim RN, BSN Entered By: Gretta Cool, BSN, RN, CWS, Kim on 12/26/2019 13:30:45 [name] ([id number]) -------------------------------------------------------------------------------- Wound Assessment Details Patient Name: AHMAUD, BRISKEY. Date of Service: 12/26/2019 9:00 AM Medical Record Number: [id number] Patient Account Number: [id number] Date of Birth/Sex: 04-19-1965 (54 y.o. M) Treating RN: Dolan Amen Primary Care Blaike Newburn: Berneta Sages Other Clinician: Referring Cyana Shook: Berneta Sages Treating Yashua Bracco/Extender: Skipper Cliche in Treatment: 41 Wound Status Wound Number: 4R Primary Etiology: Diabetic Wound/Ulcer of the Lower Extremity  Wound Location: Right, Lateral Foot Wound Status: Open Wounding Event: Gradually Appeared Comorbid History: Hypertension, Type II Diabetes,  Neuropathy Date Acquired: 03/12/2019 Weeks Of Treatment: 41 Clustered Wound: No Pending Amputation On Presentation Photos Wound Measurements Length: (cm) 0.4 % Reduc Width: (cm) 0.5 % Reduc Depth: (cm) 0.2 Epithel Area: (cm) 0.157 Tunnel Volume: (cm) 0.031 Underm tion in Area: 75.3% tion in Volume: 75.6% ialization: Small (1-33%) ing: No ining: No Wound Description Classification: Grade 1 Foul Od Wound Margin: Thickened Slough/ Exudate Amount: Small Exudate Type: Serosanguineous Exudate Color: red, brown or After Cleansing: No Fibrino Yes Wound Bed Granulation Amount: Large (67-100%) Exposed Structure Granulation Quality: Red Fascia Exposed: No Necrotic Amount: Small (1-33%) Fat Layer (Subcutaneous Tissue) Exposed: Yes Necrotic Quality: Adherent Slough Tendon Exposed: No Muscle Exposed: No Joint Exposed: No Bone Exposed: No Treatment Notes Wound #4R (Right, Lateral Foot) Notes endoform, dry gauze, offloading felt, conform NATHANYEL, DEFENBAUGH ([id number]) Electronic Signature(s) Signed: 12/26/2019 2:32:18 PM By: Phillis Haggis, Dondra Prader RN Entered By: Phillis Haggis, Dondra Prader on 12/26/2019 09:08:58 Azzie Glatter ([id number]) -------------------------------------------------------------------------------- Vitals Details Patient Name: Azzie Glatter Date of Service: 12/26/2019 9:00 AM Medical Record Number: [id number] Patient Account Number: [id number] Date of Birth/Sex: December 14, 1965 (54 y.o. M) Treating RN: Rogers Blocker Primary Care Keatyn Jawad: Larina Earthly Other Clinician: Referring Carli Lefevers: Larina Earthly Treating Jaivyn Gulla/Extender: Rowan Blase in Treatment: 79 Vital Signs Time Taken: 08:58 Temperature (F): 97.9 Height (in): 74 Pulse (bpm): 83 Weight (lbs): 308 Respiratory Rate (breaths/min): 16 Body Mass Index (BMI): 39.5 Blood Pressure (mmHg): 135/80 Reference Range: 80 - 120 mg / dl Electronic Signature(s) Signed: 12/26/2019 2:32:18 PM  By: Phillis Haggis, Dondra Prader RN Entered By: Phillis Haggis, Dondra Prader on 12/26/2019 09:00:30

## 2019-12-31 ENCOUNTER — Other Ambulatory Visit: Payer: Self-pay

## 2019-12-31 ENCOUNTER — Ambulatory Visit
Admission: RE | Admit: 2019-12-31 | Discharge: 2019-12-31 | Disposition: A | Discharge status: Home / Self Care | Payer: Managed Care, Other (non HMO) | Source: Ambulatory Visit | Attending: Physician Assistant | Admitting: Physician Assistant

## 2019-12-31 DIAGNOSIS — L97512 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d: Secondary | ICD-10-CM | POA: Diagnosis not present

## 2019-12-31 DIAGNOSIS — M869 Osteomyelitis, unspecified: Secondary | ICD-10-CM | POA: Diagnosis present

## 2019-12-31 DIAGNOSIS — E11621 Type 2 diabetes mellitus with foot ulcer: Secondary | ICD-10-CM | POA: Diagnosis present

## 2019-12-31 DIAGNOSIS — E1169 Type 2 diabetes mellitus with other specified complication: Secondary | ICD-10-CM | POA: Insufficient documentation

## 2019-12-31 DIAGNOSIS — L97509 Non-pressure chronic ulcer of other part of unspecified foot with unspecified severity: Secondary | ICD-10-CM | POA: Insufficient documentation

## 2019-12-31 MED ORDER — GADOBUTROL 1 MMOL/ML IV SOLN
10.0000 mL | Freq: Once | INTRAVENOUS | Status: AC | PRN
  Administered 2019-12-31: 18:00:00 10 mL via INTRAVENOUS

## 2020-01-05 ENCOUNTER — Other Ambulatory Visit: Payer: Self-pay

## 2020-01-05 ENCOUNTER — Encounter: Payer: Managed Care, Other (non HMO) | Attending: Physician Assistant | Admitting: Physician Assistant

## 2020-01-05 DIAGNOSIS — E785 Hyperlipidemia, unspecified: Secondary | ICD-10-CM | POA: Insufficient documentation

## 2020-01-05 DIAGNOSIS — E1140 Type 2 diabetes mellitus with diabetic neuropathy, unspecified: Secondary | ICD-10-CM | POA: Diagnosis not present

## 2020-01-05 DIAGNOSIS — L84 Corns and callosities: Secondary | ICD-10-CM | POA: Diagnosis not present

## 2020-01-05 DIAGNOSIS — Z89422 Acquired absence of other left toe(s): Secondary | ICD-10-CM | POA: Insufficient documentation

## 2020-01-05 DIAGNOSIS — I10 Essential (primary) hypertension: Secondary | ICD-10-CM | POA: Diagnosis not present

## 2020-01-05 DIAGNOSIS — L03115 Cellulitis of right lower limb: Secondary | ICD-10-CM | POA: Insufficient documentation

## 2020-01-05 DIAGNOSIS — E11621 Type 2 diabetes mellitus with foot ulcer: Secondary | ICD-10-CM | POA: Insufficient documentation

## 2020-01-05 DIAGNOSIS — L97512 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d: Secondary | ICD-10-CM | POA: Diagnosis not present

## 2020-01-05 NOTE — Progress Notes (Addendum)
JOSEPHUS, CRUTHIRDS ([id number]) Visit Report for 01/05/2020 Chief Complaint Document Details Patient Name: [name], [name]. Date of Service: 01/05/2020 2:00 PM Medical Record Number: [id number] Patient Account Number: [id number] Date of Birth/Sex: 26-Jan-1965 (55 y.o. M) Treating RN: Cornell Barman Primary Care Provider: Berneta Sages Other Clinician: Referring Provider: Berneta Sages Treating Provider/Extender: Skipper Cliche in Treatment: 41 Information Obtained from: Patient Chief Complaint Right lateral foot ulcer Electronic Signature(s) Signed: 01/05/2020 12:48:52 PM By: [name] PA-C Entered By: [name] on 01/05/2020 12:48:52 [name] ([id number]) -------------------------------------------------------------------------------- Debridement Details Patient Name: [name] Date of Service: 01/05/2020 2:00 PM Medical Record Number: [id number] Patient Account Number: [id number] Date of Birth/Sex: [birth year] (55 y.o. M) Treating RN: Cornell Barman Primary Care Provider: Berneta Sages Other Clinician: Referring Provider: Berneta Sages Treating Provider/Extender: Skipper Cliche in Treatment: 42 Debridement Performed for Wound #4R Right,Lateral Foot Assessment: Performed By: Physician Tommie Sams., PA-C Debridement Type: Debridement Severity of Tissue Pre Debridement: Fat layer exposed Level of Consciousness (Pre- Awake and Alert procedure): Pre-procedure Verification/Time Out Yes - 12:51 Taken: Total Area Debrided (L x W): 0.4 (cm) x 1 (cm) = 0.4 (cm) Tissue and other material Viable, Non-Viable, Callus, Slough, Subcutaneous, Slough debrided: Level: Skin/Subcutaneous Tissue Debridement Description: Excisional Instrument: Curette Bleeding: Minimum Hemostasis Achieved: Pressure Response to Treatment: Procedure was tolerated well Level of Consciousness (Post- Awake and Alert procedure): Post Debridement Measurements of Total Wound Length: (cm)  0.4 Width: (cm) 1 Depth: (cm) 0.2 Volume: (cm) 0.063 Character of Wound/Ulcer Post Debridement: Stable Severity of Tissue Post Debridement: Fat layer exposed Post Procedure Diagnosis Same as Pre-procedure Electronic Signature(s) Signed: 01/05/2020 2:13:03 PM By: [name] PA-C Signed: 01/05/2020 3:19:28 PM By: Gretta Cool, BSN, RN, CWS, Kim RN, BSN Entered By: Gretta Cool, BSN, RN, CWS, Kim on 01/05/2020 12:53:07 [name] ([id number]) -------------------------------------------------------------------------------- HPI Details Patient Name: [name] Date of Service: 01/05/2020 2:00 PM Medical Record Number: [id number] Patient Account Number: [id number] Date of Birth/Sex: [birth year] (55 y.o. M) Treating RN: Cornell Barman Primary Care Provider: Berneta Sages Other Clinician: Referring Provider: Berneta Sages Treating Provider/Extender: Skipper Cliche in Treatment: 70 History of Present Illness HPI Description: This 55 year old male comes with an ulcerated area on the plantar aspect of the right foot which she's had for approximately a month. I have known him from a previous visit at [facility] wound [facility] and was treated in the months of April and May 2016 and rapidly healed a left plantar ulcer with a total contact cast. He has been a diabetic for about 16 years and tries to keep active and is fairly compliant with his diabetes management. 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 Past medical history significant for hypertension, hyperlipidemia, and status post appendectomy 1993. He does not smoke or drink alcohol. 01/14/2015 -- the patient had tolerated his total contact cast very well and had no problems and has had no systemic symptoms. However when his total contact cast was cut open he had excessive amount of purulent drainage in spite of being on antibiotics. He had had a recent x-ray done in the ER 12/22/2014 which showed IMPRESSION:No evidence of osseous erosion.  Known soft tissue ulceration is not well characterized on radiograph. Scattered vascular calcifications seen. his last hemoglobin A1c in December was 7.3. He has been on Augmentin and doxycycline for the last 2 weeks. 01/21/2015 -- his culture grew rare growth of Pantoea species an MR moderate growth of Candida parapsilosis. it is sensitive to levofloxacin. He  has not heard back from the insurance company regarding his hyperbaric oxygen therapy. His MRI has not been done yet and we will try and get him an earlier date 01/28/2015 -- MRI was done last night -- IMPRESSION:1. Soft tissue ulcer overlying the plantar aspect of the fifth metatarsal head extending to the cortex. Subcortical marrow edema in the fifth metatarsal head with corresponding T1 hypointensity is concerning for early osteomyelitis of the plantar lateral aspect of the fifth metatarsal head. Chest x-ray done on 01/14/2015 shows bronchiectatic changes without infiltrate. EKG done on generally 17 2017 shows a normal sinus rhythm and is a normal EKG. 02/04/2015 -- he was asked to see Dr. [name] last week and had 2 appointments but had to cancel both due to pressures of work. Last night he has woken up with severe pain in the foot and leg and it is swollen up. No fever or no change in his blood glucose. Addendum: I spoke with Dr. [name] who kindly agreed to accept the patient for inpatient therapy and have also opened to the hospitalist Dr. [name], and discuss details of the management including PICC line and repeat cultures. 02/12/2015-- -- was seen by Dr. [name] in the hospital and a PICC line was placed. He was to receive Ceftazidime 2 g every 12 hourly, oral levofloxacin 750 mg every 24 hourly and oral fluconazole 200 mg daily. The antibiotics were to be given for 4 weeks except the Diflucan was to be given for the first 2 weeks. Reviewed note from 02/10/2015 -- and Dr. [name] had recommended management for growth of  MSSA and Serratia. He switched him from ceftazidime to ceftriaxone 2 g every 24 hours. Levofloxacin was stopped and he would continue on fluconazole for another week. He had asked me to decide whether further imaging was necessary and whether surgical debridement of the infected bone was needed. He is doing well and has been off work for this week and we will keep him off the next week. 02/22/2015 -- he was seen by my colleague on 01/19/2015 and at that time an incision and drainage was done on his right lateral forefoot on the dorsum. Today when I probed this wound it is frankly draining pus and it communicates with the ulcer on the plantar aspect of his right foot. The patient is still on IV ceftriaxone 2 g every 24 hours and is to be seen by Dr. [name] on Friday. 03/19/2015 -- On 03/04/2015 I spoke to Dr. 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 He recommended operative debridement which would probably result in the fifth metatarsal head and toe amputation.The patient would be referred back to [place] once he was done with surgery. He was admitted to [facility] yesterday and had surgery done by podiatry for a right fifth metatarsal acute osteomyelitis with cellulitis and abscess. He had a right foot incision and drainage with fifth metatarsal partial amputation and removal of toe infected bone and soft tissue with cultures. The wound was partially closed and packing of the distal end was done. Patient was already on cefepime 2 g IV every 8 hourly and put on vancomycin pending final cultures. He had grown moderate gram-negative rods, and later found to be rare diphtheroids. I received a call from Dr. Cannon Kettle the podiatrist and we discussed the above.  On 03/10/2015 he was found positive for influenza a and has been put on  Tamiflu. Since his discharge he has been seen by Dr. Cannon Kettle who is planning to remove his sutures this coming week. He was reviewed by Dr. [name] on 03/17/2015 and his Diflucan was stopped and vancomycin. After the 3 doses he is taking. He is going to change Ceftazidime to Zosyn 3.375 g IV every 8 hours. 03/25/2015 -- he was seen by the podiatrist a couple of days ago and the sutures were removed. She will follow back with him in 4 weeks' time and at that time x-rays will be taken and a custom molded insert would be made for his shoe. 04/01/2015 -- he was seen by Dr. [name] on 03/29/2015 who pulled the PICC line stopped his IV antibiotics and recommended starting doxycycline and levofloxacin for 2 weeks. He also stop the fluconazole. The patient will follow up with him only when necessary. Readmission: 03/15/17 on evaluation today patient presents for initial evaluation concerning the new issues although he has previously been evaluated in our clinic. Unfortunately the previous evaluation led to the patient having to proceed to amputation and so he is somewhat nervous about being here [name], [name] ([id number]) today. With that being said he has a very slight blister that occurred on the plantar aspect more medial on the right great toe that has been present for just a very short amount of time, several days. With that being said he felt like initially when he called that he somewhat overreacted but due to the fact that his previous issue led to amputation he is very cautious these days I explained that he did the right thing. With that being said he has been tolerating the dressing changes without complication mainly he just been covering this. He does not have any discomfort in secondary to neuropathy it's unlikely that he feels much. With that being said he does tell me that the issue that he had here is that he went barefoot when he knows he should not have which subsequently led  to the blisters. He states is definitely not doing that anymore. His most recent hemoglobin A1c was December and registered 6.9 his ABI today was 1.1. 03/27/17 on evaluation today patient appears to be doing great in regard to his right great toe ulcer. He has been tolerating the dressing changes without complication. The good news is this is making excellent progress and he seems to be caring for this in an excellent fashion. I see no evidence of breakdown that would make me concerned that he was at risk for infection/amputation. This has obviously been his concern due to the fifth toe amputation that was necessitated previous when he had a similar issue. Nonetheless this seems to be progressing much more nicely. Readmission: 03/13/2019 upon evaluation today patient presents for reevaluation here in the clinic concerning issues he has been having with his right foot on the lateral portion at the proximal end of the metatarsal. Subsequently he tells me that this just opened up in the past couple of days or so and the erythema really began about 24 to 48 hours ago. Fortunately there is no signs of systemic infection. 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 That left him with this bony prominence that is where the wound is currently. There is erythema surrounding that does have me concerned about cellulitis. With that being said he was noncompressible as far as  ABIs are concerned I do think we can need to check into arterial studies at this point. Patient does have a history of hypertension along with diabetes mellitus type 2. He also tends to develop quite a bit of callus white often. 03/20/2019 upon evaluation today patient appears to be doing very well in regard to his wounds currently. He has been tolerating the dressing changes without complication. Fortunately there is no signs of infection. His ABIs were good at this point and registered at 1.07 on  the left and 1.09 on the right. In regard to the x-ray this was negative as well for any signs of osteomyelitis. The patient also seems to be doing better in regard to the infection. He still has several days of the antibiotic left at this point best the Bactrim DS and he seems to be doing very well with this. Overall I am extremely pleased with the progress in 1 week's time he is going require some sharp debridement today however. 03/27/2019 upon evaluation today patient actually appears to be making some progress in regard to his wound. It is a little deeper but measuring smaller which is good news due to the fact that again were clear away some of the necrotic tissue which is why the depth is increasing a little bit. Nonetheless after like were getting very close to being down at a good wound bed. Fortunately there is no signs of active infection at this time. There is a little bit of erythema immediately surrounding the wound that we do want to be very cognizant of and careful about. For that reason I am going to go ahead and extend his antibiotic today for an additional 10 days that is the Bactrim. 04/03/2019 upon evaluation today patient appears to be making some progress. I do feel like the Annitta Needs is doing a good job along with the alginate is being packed into the wound space behind. He has been tolerating the dressing changes without complication. Fortunately there is no signs of active infection at this time. No fevers, chills, nausea, vomiting, or diarrhea. 04/10/2019 upon evaluation today patient appears to be making progress. The wound is not quite as deep but he still does not have a great wound surface yet for working on this and the Santyl does seem to be cleaning things up. Fortunately there is no evidence of active infection at this time. No fevers, chills, nausea, vomiting, or diarrhea. 04/17/2019 upon evaluation today patient appears to be doing excellent at this time in regard to his  foot ulcer. I do believe the Santyl with the alginate packed in behind has been of benefit for him and I am very pleased in this regard. With that being said I am feeling like the wound surface is improving quite a bit each time I see him still I do not believe we are at the point of stating that the wound bed is perfect but again were making progress. The patient is going require some sharp debridement today. 4/22; this is a patient who is using Santyl with calcium alginate. Apparently his wound dimensions have been improving. Patient is changing the dressing himself. He has a surgical shoe. He works in a sitting position therefore is not on his feet all that much. There is undermining 05/01/2019 upon evaluation today patient appears to be doing a little better in regard to the size of his wound though he still has some depth. This does seem to be much cleaner has been using Santyl on  the base of the wound followed by packing with silver alginate and behind. Nonetheless I do believe that based on what we are seeing we may need to switch to a collagen type dressing endoform may actually be excellent for him. 05/08/2019 upon evaluation today patient's wound actually appears to be showing better granulation tissue in the base of the wound. With that being said he does have some epiboly noted around the edges of the wound especially plantar and more distal. Subsequently this is can require sharp debridement today. Fortunately there is no signs of active systemic infection though I do feel like there is some local cellulitis noted at this point. The patient also notes has had increased drainage. 5/13; wound volume quite a bit improved this week down to 3 mm. Patient states pain and drainage better. Culture from last week grew staph aureus [not MRSA] and this is sensitive to quinolones and he is on Levaquin. HOWEVER he also grew abundant Enterococcus faecalis treatment of choice for this is ampicillin.  Quinolone coverage is unreliable 5/20 completing the Augmentin. Small wound on the right lateral foot. Thick rolled edges around the wound. Using silver alginate 05/29/2019 upon evaluation today patient appears to be doing better compared to last time I saw him. Fortunately there is no signs of active infection and I do feel like he is actually making good progress. Overall the quality of the wound bed along with the size looks improved and the inflammation/erythema that was previously noted by myself when I saw the couple weeks back actually has also resolved and this is great. Overall very pleased with how things stand. 06/06/2019 upon evaluation today patient actually appears to be doing quite well with regard to his foot ulcer with regard to some of the granulation were seen at this point. There does not appear to be any evidence of systemic or local infection although he still has discomfort I feel like things are moving in a slow but surely correct direction. I think we may need to help fill some of the space however even though he is using the collagen we may need to have something behind this such as a plain packing strip to try to help hold the collagen in the base of the wound. 06/12/2019 upon evaluation today patient appears to be doing well with regard to his foot ulcer. This seems to be making some slow but steady progress. The wound does not appear to be as deep today compared to where it was previous. Fortunately there is no signs of active infection at this time. No fevers, chills, nausea, vomiting, or diarrhea. 06/19/2019 on evaluation today patient's wound does not appear to be doing quite as well as what I would like to see. Fortunately there is no signs of systemic infection at this time which is great news. With that being said I do believe that the patient needs to have a wound culture as I do [name], [name]. ([id number]) believe he likely has a local infection that needs to be  addressed. He is also can require some debridement today as well. 06/26/2019 upon evaluation today patient's wound actually appears to be showing signs of excellent improvement today. There does not appear to be any evidence of active infection which is great news. There does not appear to be significant erythema at this point. The patient does have improvement overall in the appearance of the infection he has been taking the Augmentin the culture came back showing no organism was grown at  this point but nonetheless I do believe he has been doing well with the antibiotics I would continue that at this point. 07/03/2019 upon evaluation today patient appears to be doing about the same in regard to his wound. There does not appear to be any evidence of active infection and overall I am pleased with where things stand. The patient is tolerating the dressing changes without complication but it does not appear that they are packing the wound at this time which I think is going to slow down his healing prospects. 07/10/2019 upon evaluation today patient appears to be doing little better in my opinion in regard to the overall moisture collected in the base of the wound. They have been packing this with a little bit of silver alginate and that has done quite well. Overall I am pleased in that regard. Subsequently the redness does not appear to be any worse today I did actually go ahead and marked this since this can be 2 weeks between now and when I see him again I would make sure that if anything changes or worsens he will be able to notice this and go ahead and initiate treatment with the Augmentin. 07/22/2019 patient comes in today a little bit early for evaluation secondary to the fact that he was having issues with increased drainage and concern about infection. With that being said he does appear to have developed a blister area again proximal to where the original wound opening was in although the wound bed  itself actually appears to be doing somewhat better he has had increased drainage and tracking of fluid here due to this blister. Nonetheless I believe that that does need to be cleaned away today but again I am not really certain that I see a lot of evidence for infection at this point. 07/29/2019 on evaluation today patient actually appears to be doing better in regard to his foot ulcer. There is improvement compared to last week which is great news. Nonetheless he does come prepared to apply the total contact cast today which I think would be beneficial for him. He knows he is not supposed to drive he did bring his son with him today in order to drive him. 07/31/2019 upon evaluation today patient appears to be doing well with regard to his wound today. I do not see any signs of infection I do believe that he is definitely showing some signs of improvement here in the office in a couple days as far as the overall erythema and pressure getting to the wound area is concerned. With that being said I do believe that the cast has been of great benefit for him. 08/08/2019 upon evaluation today patient appears to be doing better in regard to his wound. Overall I feel like he is showing signs of improvement. The external measurement is really not bad at all although I did remove some of the callus in order to open up the wound itself the depth is actually coming quite well and I am extremely pleased in that regard I do not think we can to see much improvement externally until we get the internal depth filled in and that again is improved. Fortunately there is no signs of active infection at this time. No fevers, chills, nausea, vomiting, or diarrhea. 08/15/2019 on evaluation today patient actually appears to be doing well with regard to his foot ulcer. This is showing some signs of minimal improvement from the standpoint of the depth and appearance of the wound  as well as the edges of the wound. It is taking its  time and to be honest I do believe that he seems to be progressing well. I am happy with the cast and what it is achieving at this point. Have contemplated checking into Dermagraft for Apligraf for the patient but again he does seem to be making fairly good progress in my opinion. If it has not tremendously changed come next week I may consider one of the 2. 08/22/2019 on evaluation today patient appears to be making good progress with regard to his wound I am still very pleased with where things stand today. Overall I think that he is making good improvements week by week and I believe that we are getting continue with the Total contact cast I will likely switch to a silver alginate dressing. 08/29/2019 on evaluation today patient appears to be doing quite well with regard to his wound. Fortunately this is measuring quite small. There is no signs of active infection at this time although he did have a little bit of a callus area just to the side of his foot where we previously noted some fluid collecting underneath that was the case today as well. I did have to remove this. With that being said overall he seems to be doing quite well. 09/05/19 on evaluation today patient appears to be doing very well with regard to his original wound which is actually very small on evaluation today. With that being said he does have a blistered area that's more proximal to where this wound is that has been a little bit more concerned. Fortunately there is no signs of active infection at this time. I'm going to need to remove the blister tissue today. 10 09/12/2019 on evaluation today patient actually appears to be doing much better in regard to his wound. I think he is actually improved more without the cast that he was prior with the cast. Overall very pleased with where things stand today. 09/22/2019 upon evaluation today patient appears to be doing very well at this time in regard to his foot ulcer. In fact I feel like  he is continue to make good progress. I see no signs right now that there is any active infection which is great news. Also see very little evidence of pressure he does have some callus buildup but again this seems to be minimal. 09/29/2019 upon evaluation today patient appears to be doing well with regard to his foot ulcer. He has been tolerating the dressing changes without complication. Fortunately there is no signs of active infection at this time. No fevers, chills, nausea, vomiting, or diarrhea. 10/28/2019 upon evaluation today patient appears to be doing a little bit more poorly than last time I saw him as last time we assumed he was completely healed. Apparently that was not the case or else this is reopened but I tend to believe that this probably has never completely closed internally though it appeared to be. He has been using the offloading felt which seems to have done well there is no signs of redness no evidence of erythema all of which is good news. Nonetheless I am concerned about the fact that he is having issues here with what appears to be a reopening of the wound. 11/04/2019 upon evaluation today patient's wound actually showed signs of fairly good improvement today which is great news the wound bed does not appear to be significantly callus covered and also does not appear to show any signs  of significant infection or really any infection whatsoever. I am very pleased in this regard. In general I think that he is making good progress although this is a lot slower than he would like to see I think we are headed back in the correct direction. He is continuing to use the offloading felt. 11/18/2019 on evaluation today patient appears to be having some callus buildup and cracking that occurred at the wound site. Subsequently actually did have to perform a fairly significant debridement on the area to clean away some of the callus buildup he tolerated that today with some bleeding with  that being said I do feel like the wound appear to be doing much better post debridement. [name], [name] ([id number]) 11/25/2019 upon evaluation today patient appears to be doing somewhat poorly in regard to his foot ulcer. Unfortunately he is having some issues with cellulitis along the lateral aspect of his foot. Unfortunately he seems to be having some issues today with infection/cellulitis in regard to the foot. It still towards the proximal/dorsal surface of where the wound is and this subsequently is the area that always seems to cause him trouble. 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 I do believe we may need to look into this further to ensure that there is nothing more significant going on considering that he seems to have an ongoing issue with this at this point. 12/02/2019 upon evaluation today patient appears to be doing better in regard to his foot ulcer. This is great news. Fortunately there is no signs of active infection systemically which is also excellent. He has been tolerating the dressing changes without complication. With that being said he does tell me that the Augmentin has been causing him some trouble as far as headaches are concerned since he is been taking it. Is unsure if it is directly related or not. Nonetheless as I explained to him it is possible that this could be something that he is experiencing as a result of the antibiotic. The good news is there are other options that we could switch him to that would work equally well while at the same time seeing if indeed the antibiotic was causing the headaches. He does want to proceed with that. 12/09/2019 on evaluation today patient's wound actually showing some signs of improvement. Fortunately there is no signs of active infection at this time great news. No fever chills noted. He has been using the alginate which I think has done well for him up to this point. No fevers, chills, nausea, vomiting, or  diarrhea. 12/18/2019 on evaluation today patient's wound is actually showing signs of fairly good granulation at this point and epithelization. He does have some callus still covering over the little tunnel off to the side that still seems to be staying open despite the fact that we have been trying to keep the callus away from this is much as possible. Fortunately there is no signs of active infection systemically at this time overall I feel like the patient is making good progress in that regard. There is no erythema whatsoever noted. With that being said were still trying to check into MRI in where we stand in that regard. Apparently has been approved but not scheduled. 12/26/2019 upon evaluation today patient appears to be doing decently well in regard to his wound. He does have some callus buildup beginning to clear this away. Fortunately there is no signs of active infection at this time. No fevers, chills, nausea, vomiting, or  diarrhea. 01/05/2020 upon evaluation today patient appears to be doing much better in regard to his foot ulcer. 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 It also did show that he had no evidence of osteomyelitis of the forefoot which was excellent. 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 Overall the fact that he does not have a bone infection is the ideal thing here. Electronic Signature(s) Signed: 01/05/2020 12:58:27 PM By: [name] PA-C Entered By: [name] on 01/05/2020 12:58:26 [name] ([id number]) -------------------------------------------------------------------------------- Physical Exam Details Patient Name: [name], [name] Date of Service: 01/05/2020 2:00 PM Medical Record Number: [id number] Patient Account Number: [id number] Date of Birth/Sex:  02-08-1965 (55 y.o. M) Treating RN: Cornell Barman Primary Care Provider: Berneta Sages Other Clinician: Referring Provider: Berneta Sages Treating Provider/Extender: Skipper Cliche in Treatment: 22 Constitutional Well-nourished and well-hydrated in no acute distress. Respiratory normal breathing without difficulty. Psychiatric this patient is able to make decisions and demonstrates good insight into disease process. Alert and Oriented x 3. pleasant and cooperative. Notes [facility] patient's wound bed actually showed signs of good granulation and some areas at this point. He does still have some callus buildup and continue to remove movement this point I think he does better with weekly visits and I will definitely get back to that starting next week. With that being said I am very pleased with overall the appearance of the wound bed today post debridement which is great news. There is no signs of active infection at this time. Electronic Signature(s) Signed: 01/05/2020 12:58:57 PM By: [name] PA-C Entered By: [name] on 01/05/2020 12:58:56 [name] ([id number]) -------------------------------------------------------------------------------- Physician Orders Details Patient Name: [name] Date of Service: 01/05/2020 2:00 PM Medical Record Number: [id number] Patient Account Number: [id number] Date of Birth/Sex: 1965-08-20 (55 y.o. 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 Wound Cleansing/Bathing/Shower/Hygiene o Clean wound with Normal Saline. o May shower without dressing. Gently cleanse wound with antibacterial soap, rinse and pat dry prior to dressing wounds Primary Wound Dressing Wound #4R Right,Lateral Foot o Other: - Endoform Secondary Dressing o Gauze and Kerlix/Conform Dressing Change Frequency Wound #4R Right,Lateral Foot o Change Dressing Monday, Wednesday, Friday Off-Loading o Offloading felt to foot. Electronic Signature(s) Signed: 01/05/2020 2:13:03 PM By: [name] PA-C Signed: 01/05/2020 3:19:28 PM By: Gretta Cool, BSN, RN, CWS, Kim RN, BSN Entered By: Gretta Cool, BSN, RN, CWS, Kim on 01/05/2020 13:02:48 [name] ([id number]) -------------------------------------------------------------------------------- Problem List Details Patient Name: [name], [name] Date of Service: 01/05/2020 2:00 PM Medical Record Number: [id number] Patient Account Number: [id number] Date of Birth/Sex: 1965-10-18 (55 y.o. M) Treating RN: Cornell Barman Primary Care Provider: Berneta Sages Other Clinician: Referring Provider: Berneta Sages Treating Provider/Extender: Skipper Cliche in Treatment: 62 Active Problems ICD-10 Encounter Code Description Active Date MDM Diagnosis E11.621 Type  2 diabetes mellitus with foot ulcer 03/13/2019 No Yes L97.512 Non-pressure chronic ulcer of other part of right foot with fat layer 03/13/2019 No Yes exposed L03.115 Cellulitis of right lower limb 05/15/2019 No Yes L84 Corns and callosities 03/13/2019 No Yes I10 Essential (primary) hypertension 03/13/2019 No Yes Z89.422 Acquired absence of other left toe(s) 03/13/2019 No Yes Inactive Problems Resolved Problems Electronic Signature(s) Signed: 01/05/2020 12:48:44 PM By: [name] PA-C Entered By: [name] on 01/05/2020 12:48:44 [name]  ([id number]) -------------------------------------------------------------------------------- Progress Note Details Patient Name: [name] Date of Service: 01/05/2020 2:00 PM Medical Record Number: [id number] Patient Account Number: [id number] Date of Birth/Sex: 11-23-1965 (55 y.o. M) Treating RN: Cornell Barman Primary Care Provider: Berneta Sages Other Clinician: Referring Provider: Berneta Sages Treating Provider/Extender: Skipper Cliche in Treatment: 35 Subjective Chief Complaint Information obtained from Patient Right lateral foot ulcer History of Present Illness (HPI) This 55 year old male comes with an ulcerated area on the plantar aspect of the right foot which she's had for approximately a month. I have known him from a previous visit at [facility] wound center and was treated in the months of April and May 2016 and rapidly healed a left plantar ulcer with a total contact cast. He has been a diabetic for about 16 years and tries to keep active and is fairly compliant with his diabetes management. 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 Past medical history significant for hypertension, hyperlipidemia, and status post appendectomy 1993. He does not smoke or drink alcohol. 01/14/2015 -- the patient had tolerated his total contact cast very well and had no problems and has had no systemic symptoms. However when his total contact cast was cut open he had excessive amount of purulent drainage in spite of being on antibiotics. He had had a recent x-ray done in the ER 12/22/2014 which showed IMPRESSION:No evidence of osseous erosion. Known soft tissue ulceration is not well characterized on radiograph. Scattered vascular calcifications seen. his last hemoglobin A1c in December was 7.3. He has been on Augmentin and doxycycline for the last 2 weeks. 01/21/2015 -- his culture grew rare growth of Pantoea species an MR moderate growth of Candida parapsilosis. it is sensitive to  levofloxacin. He has not heard back from the insurance company regarding his hyperbaric oxygen therapy. His MRI has not been done yet and we will try and get him an earlier date 01/28/2015 -- MRI was done last night -- IMPRESSION:1. Soft tissue ulcer overlying the plantar aspect of the fifth metatarsal head extending to the cortex. Subcortical marrow edema in the fifth metatarsal head with corresponding T1 hypointensity is concerning for early osteomyelitis of the plantar lateral aspect of the fifth metatarsal head. Chest x-ray done on 01/14/2015 shows bronchiectatic changes without infiltrate. EKG done on generally 17 2017 shows a normal sinus rhythm and is a normal EKG. 02/04/2015 -- he was asked to see Dr. [name] last week and had 2 appointments but had to cancel both due to pressures of work. Last night he has woken up with severe pain in the foot and leg and it is swollen up. No fever or no change in his blood glucose. Addendum: I spoke with Dr. [name] who kindly agreed to accept the patient for inpatient therapy and have also opened to the hospitalist Dr. [name], and discuss details of the management including PICC line and repeat cultures. 02/12/2015-- -- was seen by Dr. [name] in the hospital and a PICC line was placed. He was to receive  Ceftazidime 2 g every 12 hourly, oral levofloxacin 750 mg every 24 hourly and oral fluconazole 200 mg daily. The antibiotics were to be given for 4 weeks except the Diflucan was to be given for the first 2 weeks. Reviewed note from 02/10/2015 -- and Dr. [name] had recommended management for growth of MSSA and Serratia. He switched him from ceftazidime to ceftriaxone 2 g every 24 hours. Levofloxacin was stopped and he would continue on fluconazole for another week. He had asked me to decide whether further imaging was necessary and whether surgical debridement of the infected bone was needed. He is doing well and has been off work for this week  and we will keep him off the next week. 02/22/2015 -- he was seen by my colleague on 01/19/2015 and at that time an incision and drainage was done on his right lateral forefoot on the dorsum. Today when I probed this wound it is frankly draining pus and it communicates with the ulcer on the plantar aspect of his right foot. The patient is still on IV ceftriaxone 2 g every 24 hours and is to be seen by Dr. [name] on Friday. 03/19/2015 -- On 03/04/2015 I spoke to Dr. 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 He recommended operative debridement which would probably result in the fifth metatarsal head and toe amputation.The patient would be referred back to [place] once he was done with surgery. He was admitted to [facility] yesterday and had surgery done by podiatry for a right fifth metatarsal acute osteomyelitis with cellulitis and abscess. He had a right foot incision and drainage with fifth metatarsal partial amputation and removal of toe infected bone and soft tissue with cultures. The wound was partially closed and packing of the distal end was done. Patient was already on cefepime 2 g IV every 8 hourly and put on vancomycin pending final cultures. He had grown moderate gram-negative rods, and later found to be rare diphtheroids. I received a call from Dr. Cannon Kettle the podiatrist and we discussed the above. On 03/10/2015 he was found positive for influenza a and has been put on Tamiflu. Since his discharge he has been seen by Dr. Cannon Kettle who is planning to remove his sutures this coming week. He was reviewed by Dr. [name] on 03/17/2015 and his Diflucan was stopped and vancomycin. After the 3 doses he is taking. He is going to change Ceftazidime to Zosyn 3.375 g IV every 8 hours. 03/25/2015 -- he was seen by the podiatrist a  couple of days ago and the sutures were removed. She will follow back with him in 4 weeks' time and at that time x-rays will be taken and a custom molded insert would be made for his shoe. 04/01/2015 -- he was seen by Dr. [name] on 03/29/2015 who pulled the PICC line stopped his IV antibiotics and recommended starting doxycycline and levofloxacin for 2 weeks. He also stop the fluconazole. The patient will follow up with him only when necessary. [name], [name] ([id number]) Readmission: 03/15/17 on evaluation today patient presents for initial evaluation concerning the new issues although he has previously been evaluated in our clinic. Unfortunately the previous evaluation led to the patient having to proceed to amputation and so he is somewhat nervous about being here today. With that being said he has  a very slight blister that occurred on the plantar aspect more medial on the right great toe that has been present for just a very short amount of time, several days. With that being said he felt like initially when he called that he somewhat overreacted but due to the fact that his previous issue led to amputation he is very cautious these days I explained that he did the right thing. With that being said he has been tolerating the dressing changes without complication mainly he just been covering this. He does not have any discomfort in secondary to neuropathy it's unlikely that he feels much. With that being said he does tell me that the issue that he had here is that he went barefoot when he knows he should not have which subsequently led to the blisters. He states is definitely not doing that anymore. His most recent hemoglobin A1c was December and registered 6.9 his ABI today was 1.1. 03/27/17 on evaluation today patient appears to be doing great in regard to his right great toe ulcer. He has been tolerating the dressing changes without complication. The good news is this is making  excellent progress and he seems to be caring for this in an excellent fashion. I see no evidence of breakdown that would make me concerned that he was at risk for infection/amputation. This has obviously been his concern due to the fifth toe amputation that was necessitated previous when he had a similar issue. Nonetheless this seems to be progressing much more nicely. Readmission: 03/13/2019 upon evaluation today patient presents for reevaluation here in the clinic concerning issues he has been having with his right foot on the lateral portion at the proximal end of the metatarsal. Subsequently he tells me that this just opened up in the past couple of days or so and the erythema really began about 24 to 48 hours ago. Fortunately there is no signs of systemic infection. 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 That left him with this bony prominence that is where the wound is currently. There is erythema surrounding that does have me concerned about cellulitis. With that being said he was noncompressible as far as ABIs are concerned I do think we can need to check into arterial studies at this point. Patient does have a history of hypertension along with diabetes mellitus type 2. He also tends to develop quite a bit of callus white often. 03/20/2019 upon evaluation today patient appears to be doing very well in regard to his wounds currently. He has been tolerating the dressing changes without complication. Fortunately there is no signs of infection. His ABIs were good at this point and registered at 1.07 on the left and 1.09 on the right. In regard to the x-ray this was negative as well for any signs of osteomyelitis. The patient also seems to be doing better in regard to the infection. He still has several days of the antibiotic left at this point best the Bactrim DS and he seems to be doing very well with this. Overall I am extremely pleased with the  progress in 1 week's time he is going require some sharp debridement today however. 03/27/2019 upon evaluation today patient actually appears to be making some progress in regard to his wound. It is a little deeper but measuring smaller which is good news due to the fact that again were clear away some of the necrotic tissue which is why the depth  is increasing a little bit. Nonetheless after like were getting very close to being down at a good wound bed. Fortunately there is no signs of active infection at this time. There is a little bit of erythema immediately surrounding the wound that we do want to be very cognizant of and careful about. For that reason I am going to go ahead and extend his antibiotic today for an additional 10 days that is the Bactrim. 04/03/2019 upon evaluation today patient appears to be making some progress. I do feel like the Annitta Needs is doing a good job along with the alginate is being packed into the wound space behind. He has been tolerating the dressing changes without complication. Fortunately there is no signs of active infection at this time. No fevers, chills, nausea, vomiting, or diarrhea. 04/10/2019 upon evaluation today patient appears to be making progress. The wound is not quite as deep but he still does not have a great wound surface yet for working on this and the Santyl does seem to be cleaning things up. Fortunately there is no evidence of active infection at this time. No fevers, chills, nausea, vomiting, or diarrhea. 04/17/2019 upon evaluation today patient appears to be doing excellent at this time in regard to his foot ulcer. I do believe the Santyl with the alginate packed in behind has been of benefit for him and I am very pleased in this regard. With that being said I am feeling like the wound surface is improving quite a bit each time I see him still I do not believe we are at the point of stating that the wound bed is perfect but again were making progress.  The patient is going require some sharp debridement today. 4/22; this is a patient who is using Santyl with calcium alginate. Apparently his wound dimensions have been improving. Patient is changing the dressing himself. He has a surgical shoe. He works in a sitting position therefore is not on his feet all that much. There is undermining 05/01/2019 upon evaluation today patient appears to be doing a little better in regard to the size of his wound though he still has some depth. This does seem to be much cleaner has been using Santyl on the base of the wound followed by packing with silver alginate and behind. Nonetheless I do believe that based on what we are seeing we may need to switch to a collagen type dressing endoform may actually be excellent for him. 05/08/2019 upon evaluation today patient's wound actually appears to be showing better granulation tissue in the base of the wound. With that being said he does have some epiboly noted around the edges of the wound especially plantar and more distal. Subsequently this is can require sharp debridement today. Fortunately there is no signs of active systemic infection though I do feel like there is some local cellulitis noted at this point. The patient also notes has had increased drainage. 5/13; wound volume quite a bit improved this week down to 3 mm. Patient states pain and drainage better. Culture from last week grew staph aureus [not MRSA] and this is sensitive to quinolones and he is on Levaquin. HOWEVER he also grew abundant Enterococcus faecalis treatment of choice for this is ampicillin. Quinolone coverage is unreliable 5/20 completing the Augmentin. Small wound on the right lateral foot. Thick rolled edges around the wound. Using silver alginate 05/29/2019 upon evaluation today patient appears to be doing better compared to last time I saw him. Fortunately there is  no signs of active infection and I do feel like he is actually making good  progress. Overall the quality of the wound bed along with the size looks improved and the inflammation/erythema that was previously noted by myself when I saw the couple weeks back actually has also resolved and this is great. Overall very pleased with how things stand. 06/06/2019 upon evaluation today patient actually appears to be doing quite well with regard to his foot ulcer with regard to some of the granulation were seen at this point. There does not appear to be any evidence of systemic or local infection although he still has discomfort I feel like things are moving in a slow but surely correct direction. I think we may need to help fill some of the space however even though he is using the collagen we may need to have something behind this such as a plain packing strip to try to help hold the collagen in the base of the wound. 06/12/2019 upon evaluation today patient appears to be doing well with regard to his foot ulcer. This seems to be making some slow but steady progress. The wound does not appear to be as deep today compared to where it was previous. Fortunately there is no signs of active infection at [name], [name]. ([id number]) this time. No fevers, chills, nausea, vomiting, or diarrhea. 06/19/2019 on evaluation today patient's wound does not appear to be doing quite as well as what I would like to see. Fortunately there is no signs of systemic infection at this time which is great news. With that being said I do believe that the patient needs to have a wound culture as I do believe he likely has a local infection that needs to be addressed. He is also can require some debridement today as well. 06/26/2019 upon evaluation today patient's wound actually appears to be showing signs of excellent improvement today. There does not appear to be any evidence of active infection which is great news. There does not appear to be significant erythema at this point. The patient does  have improvement overall in the appearance of the infection he has been taking the Augmentin the culture came back showing no organism was grown at this point but nonetheless I do believe he has been doing well with the antibiotics I would continue that at this point. 07/03/2019 upon evaluation today patient appears to be doing about the same in regard to his wound. There does not appear to be any evidence of active infection and overall I am pleased with where things stand. The patient is tolerating the dressing changes without complication but it does not appear that they are packing the wound at this time which I think is going to slow down his healing prospects. 07/10/2019 upon evaluation today patient appears to be doing little better in my opinion in regard to the overall moisture collected in the base of the wound. They have been packing this with a little bit of silver alginate and that has done quite well. Overall I am pleased in that regard. Subsequently the redness does not appear to be any worse today I did actually go ahead and marked this since this can be 2 weeks between now and when I see him again I would make sure that if anything changes or worsens he will be able to notice this and go ahead and initiate treatment with the Augmentin. 07/22/2019 patient comes in today a little bit early for evaluation secondary to the  fact that he was having issues with increased drainage and concern about infection. With that being said he does appear to have developed a blister area again proximal to where the original wound opening was in although the wound bed itself actually appears to be doing somewhat better he has had increased drainage and tracking of fluid here due to this blister. Nonetheless I believe that that does need to be cleaned away today but again I am not really certain that I see a lot of evidence for infection at this point. 07/29/2019 on evaluation today patient actually appears to  be doing better in regard to his foot ulcer. There is improvement compared to last week which is great news. Nonetheless he does come prepared to apply the total contact cast today which I think would be beneficial for him. He knows he is not supposed to drive he did bring his son with him today in order to drive him. 07/31/2019 upon evaluation today patient appears to be doing well with regard to his wound today. I do not see any signs of infection I do believe that he is definitely showing some signs of improvement here in the office in a couple days as far as the overall erythema and pressure getting to the wound area is concerned. With that being said I do believe that the cast has been of great benefit for him. 08/08/2019 upon evaluation today patient appears to be doing better in regard to his wound. Overall I feel like he is showing signs of improvement. The external measurement is really not bad at all although I did remove some of the callus in order to open up the wound itself the depth is actually coming quite well and I am extremely pleased in that regard I do not think we can to see much improvement externally until we get the internal depth filled in and that again is improved. Fortunately there is no signs of active infection at this time. No fevers, chills, nausea, vomiting, or diarrhea. 08/15/2019 on evaluation today patient actually appears to be doing well with regard to his foot ulcer. This is showing some signs of minimal improvement from the standpoint of the depth and appearance of the wound as well as the edges of the wound. It is taking its time and to be honest I do believe that he seems to be progressing well. I am happy with the cast and what it is achieving at this point. Have contemplated checking into Dermagraft for Apligraf for the patient but again he does seem to be making fairly good progress in my opinion. If it has not tremendously changed come next week I may consider  one of the 2. 08/22/2019 on evaluation today patient appears to be making good progress with regard to his wound I am still very pleased with where things stand today. Overall I think that he is making good improvements week by week and I believe that we are getting continue with the Total contact cast I will likely switch to a silver alginate dressing. 08/29/2019 on evaluation today patient appears to be doing quite well with regard to his wound. Fortunately this is measuring quite small. There is no signs of active infection at this time although he did have a little bit of a callus area just to the side of his foot where we previously noted some fluid collecting underneath that was the case today as well. I did have to remove this. With that being said overall  he seems to be doing quite well. 09/05/19 on evaluation today patient appears to be doing very well with regard to his original wound which is actually very small on evaluation today. With that being said he does have a blistered area that's more proximal to where this wound is that has been a little bit more concerned. Fortunately there is no signs of active infection at this time. I'm going to need to remove the blister tissue today. 10 09/12/2019 on evaluation today patient actually appears to be doing much better in regard to his wound. I think he is actually improved more without the cast that he was prior with the cast. Overall very pleased with where things stand today. 09/22/2019 upon evaluation today patient appears to be doing very well at this time in regard to his foot ulcer. In fact I feel like he is continue to make good progress. I see no signs right now that there is any active infection which is great news. Also see very little evidence of pressure he does have some callus buildup but again this seems to be minimal. 09/29/2019 upon evaluation today patient appears to be doing well with regard to his foot ulcer. He has been  tolerating the dressing changes without complication. Fortunately there is no signs of active infection at this time. No fevers, chills, nausea, vomiting, or diarrhea. 10/28/2019 upon evaluation today patient appears to be doing a little bit more poorly than last time I saw him as last time we assumed he was completely healed. Apparently that was not the case or else this is reopened but I tend to believe that this probably has never completely closed internally though it appeared to be. He has been using the offloading felt which seems to have done well there is no signs of redness no evidence of erythema all of which is good news. Nonetheless I am concerned about the fact that he is having issues here with what appears to be a reopening of the wound. 11/04/2019 upon evaluation today patient's wound actually showed signs of fairly good improvement today which is great news the wound bed does not appear to be significantly callus covered and also does not appear to show any signs of significant infection or really any infection whatsoever. I am very pleased in this regard. In general I think that he is making good progress although this is a lot slower than he would like to see I think we are headed back in the correct direction. He is continuing to use the offloading felt. [name], [name] ([id number]) 11/18/2019 on evaluation today patient appears to be having some callus buildup and cracking that occurred at the wound site. Subsequently actually did have to perform a fairly significant debridement on the area to clean away some of the callus buildup he tolerated that today with some bleeding with that being said I do feel like the wound appear to be doing much better post debridement. 11/25/2019 upon evaluation today patient appears to be doing somewhat poorly in regard to his foot ulcer. Unfortunately he is having some issues with cellulitis along the lateral aspect of his foot. Unfortunately  he seems to be having some issues today with infection/cellulitis in regard to the foot. It still towards the proximal/dorsal surface of where the wound is and this subsequently is the area that always seems to cause him trouble. 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 I do believe we may need to look into this further to  ensure that there is nothing more significant going on considering that he seems to have an ongoing issue with this at this point. 12/02/2019 upon evaluation today patient appears to be doing better in regard to his foot ulcer. This is great news. Fortunately there is no signs of active infection systemically which is also excellent. He has been tolerating the dressing changes without complication. With that being said he does tell me that the Augmentin has been causing him some trouble as far as headaches are concerned since he is been taking it. Is unsure if it is directly related or not. Nonetheless as I explained to him it is possible that this could be something that he is experiencing as a result of the antibiotic. The good news is there are other options that we could switch him to that would work equally well while at the same time seeing if indeed the antibiotic was causing the headaches. He does want to proceed with that. 12/09/2019 on evaluation today patient's wound actually showing some signs of improvement. Fortunately there is no signs of active infection at this time great news. No fever chills noted. He has been using the alginate which I think has done well for him up to this point. No fevers, chills, nausea, vomiting, or diarrhea. 12/18/2019 on evaluation today patient's wound is actually showing signs of fairly good granulation at this point and epithelization. He does have some callus still covering over the little tunnel off to the side that still seems to be staying open despite the fact that we have been trying to keep the callus away from this is much as  possible. Fortunately there is no signs of active infection systemically at this time overall I feel like the patient is making good progress in that regard. There is no erythema whatsoever noted. With that being said were still trying to check into MRI in where we stand in that regard. Apparently has been approved but not scheduled. 12/26/2019 upon evaluation today patient appears to be doing decently well in regard to his wound. He does have some callus buildup beginning to clear this away. Fortunately there is no signs of active infection at this time. No fevers, chills, nausea, vomiting, or diarrhea. 01/05/2020 upon evaluation today patient appears to be doing much better in regard to his foot ulcer. 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 It also did show that he had no evidence of osteomyelitis of the forefoot which was excellent. 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 Overall the fact that he does not have a bone infection is the ideal thing here. Objective Constitutional Well-nourished and well-hydrated in no acute distress. Vitals Time Taken: 12:39 PM, Height: 74 in, Weight: 308 lbs, BMI: 39.5, Temperature: 98.4 F, Pulse: 93 bpm, Respiratory Rate: 16 breaths/min, Blood Pressure: 155/80 mmHg. General Notes: Blood sugar this morning 136 Respiratory normal breathing without difficulty. Psychiatric this patient is able to make decisions and demonstrates good insight into disease process. Alert and Oriented x 3. pleasant and cooperative. General Notes: Spectrum patient's wound bed actually showed signs of good granulation and some areas at this point. He does still have some callus buildup and continue to remove movement this point I think he does better with weekly visits and I will definitely get back  to that starting next week. With that being said I am very pleased with overall the appearance of the wound bed today post debridement which is great news. There is no signs of active infection at this time. Integumentary (Hair, Skin) Wound #4R status is Open. Original cause of wound was Gradually Appeared. The wound is located on the Right,Lateral Foot. The wound measures 0.4cm length x 0.5cm width x 0.2cm depth; 0.157cm^2 area and 0.031cm^3 volume. There is Fat Layer (Subcutaneous Tissue) exposed. There is a small amount of serosanguineous drainage noted. The wound margin is thickened. There is large (67-100%) red granulation within the wound bed. There is a small (1-33%) amount of necrotic tissue within the wound bed including Adherent Slough. Assessment [name], [name] ([id number]) Active Problems ICD-10 Type 2 diabetes mellitus with foot ulcer 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d Cellulitis of right lower limb Corns and callosities Essential (primary) hypertension Acquired absence of other left toe(s) Procedures Wound #4R Pre-procedure diagnosis of Wound #4R is a Diabetic Wound/Ulcer of the Lower Extremity located on the Right,Lateral Foot .Severity of Tissue Pre Debridement is: Fat layer exposed. There was a Excisional Skin/Subcutaneous Tissue Debridement with a total area of 0.4 sq cm performed by Nelida Meuse., PA-C. With the following instrument(s): Curette to remove Viable and Non-Viable tissue/material. Material removed includes Callus, Subcutaneous Tissue, and Slough. No specimens were taken. A time out was conducted at 12:51, prior to the start of the procedure. A Minimum amount of bleeding was controlled with Pressure. The procedure was tolerated well. Post Debridement Measurements: 0.4cm length x 1cm width x 0.2cm depth; 0.063cm^3 volume. Character of Wound/Ulcer Post Debridement is stable. Severity of Tissue Post Debridement is:  Fat layer exposed. Post procedure Diagnosis Wound #4R: Same as Pre-Procedure Plan Follow-up Appointments: Return Appointment in 1 week. Wound Cleansing/Bathing/Shower/Hygiene: Clean wound with Normal Saline. May shower without dressing. Gently cleanse wound with antibacterial soap, rinse and pat dry prior to dressing wounds Primary Wound Dressing: Wound #4R Right,Lateral Foot: Other: - Endoform Secondary Dressing: Gauze and Kerlix/Conform Dressing Change Frequency: Wound #4R Right,Lateral Foot: Change Dressing Monday, Wednesday, Friday Off-Loading: Offloading felt to foot. 1 would recommend currently that we go ahead and continue with the wound care measures as before been using endoform which I think is doing a good job for him. 2. I am also can recommend at this time we have the patient continue to monitor for any signs of worsening infection. Obviously if anything ensues he should contact the office and let me know soon as possible. 3. I am also can recommend he continue with the offloading shoe obviously cannot wear it on days like today where Darcel Smalling is actually a snowstorm outside obviously we do not want the wound getting wet. Otherwise however he is try to use the offloading shoe as much as he can otherwise he uses his diabetic shoes. We will see patient back for reevaluation in 1 week here in the clinic. If anything worsens or changes patient will contact our office for additional recommendations. Electronic Signature(s) Signed: 01/05/2020 12:59:28 PM By: Lenda Kelp PA-C Entered By: Lenda Kelp on 01/05/2020 12:59:27 [name] ([id number]) -------------------------------------------------------------------------------- SuperBill Details Patient Name: [name] Date of Service: 01/05/2020 Medical Record Number: [id number] Patient Account Number: [id number] Date of Birth/Sex: April 19, 1965 (55 y.o. M) Treating RN: Huel Coventry Primary Care Provider:  Larina Earthly Other Clinician: Referring Provider: Larina Earthly Treating Provider/Extender: Rowan Blase in Treatment: [phone]  Diagnosis Coding ICD-10 Codes Code Description E11.621 Type 2 diabetes mellitus with foot ulcer L97.512 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d L03.115 Cellulitis of right lower limb L84 Corns and callosities I10 Essential (primary) hypertension Z89.422 Acquired absence of other left toe(s) Facility Procedures CPT4 Code: [id number] Description: [id number] - DEB SUBQ TISSUE 20 SQ CM/< Modifier: Quantity: 1 CPT4 Code: Description: ICD-10 Diagnosis Description L97.512 Non-pressure chronic ulcer of other part of right foot with fat layer ex Modifier: posed Quantity: Physician Procedures CPT4 [id number] Description: 11042 - WC PHYS SUBQ TISS 20 SQ CM Modifier: Quantity: 1 CPT4 Code: Description: ICD-10 Diagnosis Description L97.512 Non-pressure chronic ulcer of other part of right foot with fat layer ex Modifier: posed Quantity: Electronic Signature(s) Signed: 01/05/2020 12:59:40 PM By: [name] PA-C Entered By: [name] on 01/05/2020 12:59:38

## 2020-01-05 NOTE — Progress Notes (Signed)
[name], [name] ([id number]) Visit Report for 01/05/2020 Arrival Information Details Patient Name: [name], [name]. Date of Service: 01/05/2020 2:00 PM Medical Record Number: [id number] Patient Account Number: [id number] Date of Birth/Sex: [birth year] (55 y.o. M) Treating RN: Cornell Barman Primary Care Sujey Gundry: Berneta Sages Other Clinician: Referring Chonda Baney: Berneta Sages Treating Zimir Kittleson/Extender: Skipper Cliche in Treatment: 45 Visit Information History Since Last Visit Added or deleted any medications: No Patient Arrived: Ambulatory Has Dressing in Place as Prescribed: Yes Arrival Time: 12:37 Pain Present Now: No Accompanied By: self Transfer Assistance: None Patient Identification Verified: Yes Secondary Verification Process Completed: Yes Patient Requires Transmission-Based Precautions: No Patient Has Alerts: Yes Patient Alerts: DMII Electronic Signature(s) Signed: 01/05/2020 3:19:28 PM By: Gretta Cool, BSN, RN, CWS, Kim RN, BSN Entered By: Gretta Cool, BSN, RN, CWS, Kim on 01/05/2020 12:38:17 [name] ([id number]) -------------------------------------------------------------------------------- Encounter Discharge Information Details Patient Name: [name], [name]. Date of Service: 01/05/2020 2:00 PM Medical Record Number: [id number] Patient Account Number: [id number] Date of Birth/Sex: October 15, 1965 (55 y.o. M) Treating RN: Cornell Barman Primary Care Tristain Daily: Berneta Sages Other Clinician: Referring Kaesyn Johnston: Berneta Sages Treating Bettejane Leavens/Extender: Skipper Cliche in Treatment: 9 Encounter Discharge Information Items Post Procedure Vitals Discharge Condition: Stable Temperature (F): 98.4 Ambulatory Status: Ambulatory Pulse (bpm): 93 Discharge Destination: Home Respiratory Rate (breaths/min): 16 Transportation: Private Auto Blood Pressure (mmHg): 155/80 Accompanied By: self Schedule Follow-up Appointment: Yes Clinical Summary of Care: Electronic  Signature(s) Signed: 01/05/2020 3:19:28 PM By: Gretta Cool, BSN, RN, CWS, Kim RN, BSN Entered By: Gretta Cool, BSN, RN, CWS, Kim on 01/05/2020 13:04:11 [name] ([id number]) -------------------------------------------------------------------------------- Lower Extremity Assessment Details Patient Name: [name], [name]. Date of Service: 01/05/2020 2:00 PM Medical Record Number: [id number] Patient Account Number: [id number] Date of Birth/Sex: 12-15-1965 (55 y.o. M) Treating RN: Cornell Barman Primary Care Mackensie Pilson: Berneta Sages Other Clinician: Referring Tacie Mccuistion: Berneta Sages Treating Demia Viera/Extender: Skipper Cliche in Treatment: 42 Edema Assessment Assessed: [Left: No] [Right: No] Edema: [Left: N] [Right: o] Vascular Assessment Pulses: Dorsalis Pedis Palpable: [Right:Yes] Electronic Signature(s) Signed: 01/05/2020 3:19:28 PM By: Gretta Cool, BSN, RN, CWS, Kim RN, BSN Entered By: Gretta Cool, BSN, RN, CWS, Kim on 01/05/2020 12:47:21 [name] ([id number]) -------------------------------------------------------------------------------- Multi Wound Chart Details Patient Name: [name], [name]. Date of Service: 01/05/2020 2:00 PM Medical Record Number: [id number] Patient Account Number: [id number] Date of Birth/Sex: 03/24/1965 (55 y.o. M) Treating RN: Cornell Barman Primary Care Raiden Yearwood: Berneta Sages Other Clinician: Referring Dennise Bamber: Berneta Sages Treating Alaijah Gibler/Extender: Skipper Cliche in Treatment: 42 Vital Signs Height(in): 74 Pulse(bpm): 93 Weight(lbs): 308 Blood Pressure(mmHg): 155/80 Body Mass Index(BMI): 40 Temperature(F): 98.4 Respiratory Rate(breaths/min): 16 Photos: [N/A:N/A] Wound Location: Right, Lateral Foot N/A N/A Wounding Event: Gradually Appeared N/A N/A Primary Etiology: Diabetic Wound/Ulcer of the Lower N/A N/A Extremity Comorbid History: Hypertension, Type II Diabetes, N/A N/A Neuropathy Date Acquired: 03/12/2019 N/A N/A Weeks of Treatment: 42 N/A  N/A Wound Status: Open N/A N/A Wound Recurrence: Yes N/A N/A Pending Amputation on Yes N/A N/A Presentation: Measurements L x W x D (cm) 0.4x0.5x0.2 N/A N/A Area (cm) : 0.157 N/A N/A Volume (cm) : 0.031 N/A N/A % Reduction in Area: 75.30% N/A N/A % Reduction in Volume: 75.60% N/A N/A Classification: Grade 1 N/A N/A Exudate Amount: Small N/A N/A Exudate Type: Serosanguineous N/A N/A Exudate Color: red, brown N/A N/A Wound Margin: Thickened N/A N/A Granulation Amount: Large (67-100%) N/A N/A Granulation Quality: Red N/A N/A Necrotic Amount: Small (1-33%) N/A N/A Exposed Structures: Fat Layer (Subcutaneous Tissue): N/A N/A Yes Fascia: No Tendon:  No Muscle: No Joint: No Bone: No Epithelialization: Small (1-33%) N/A N/A Treatment Notes Electronic Signature(s) Signed: 01/05/2020 3:19:28 PM By: Elliot Gurney, BSN, RN, CWS, Kim RN, BSN Entered By: Elliot Gurney, BSN, RN, CWS, Kim on 01/05/2020 12:47:43 [name] ([id number]) [name], [name] ([id number]) -------------------------------------------------------------------------------- Multi-Disciplinary Care Plan Details Patient Name: [name], [name]. Date of Service: 01/05/2020 2:00 PM Medical Record Number: [id number] Patient Account Number: [id number] Date of Birth/Sex: 1965/11/21 (55 y.o. M) Treating RN: Huel Coventry Primary Care Noya Santarelli: Larina Earthly Other Clinician: Referring Keeshawn Fakhouri: Larina Earthly Treating Naylene Foell/Extender: Rowan Blase in Treatment: 42 Active Inactive Necrotic Tissue Nursing Diagnoses: Impaired tissue integrity related to necrotic/devitalized tissue Knowledge deficit related to management of necrotic/devitalized tissue Goals: Necrotic/devitalized tissue will be minimized in the wound bed Date Initiated: 11/25/2019 Target Resolution Date: 12/11/2019 Goal Status: Active Patient/caregiver will verbalize understanding of reason and process for debridement of necrotic tissue Date Initiated:  11/25/2019 Target Resolution Date: 12/11/2019 Goal Status: Active Interventions: Assess patient pain level pre-, during and post procedure and prior to discharge Treatment Activities: Excisional debridement : 11/25/2019 Notes: Nutrition Nursing Diagnoses: Impaired glucose control: actual or potential Goals: Patient/caregiver verbalizes understanding of need to maintain therapeutic glucose control per primary care physician Date Initiated: 03/13/2019 Target Resolution Date: 04/11/2019 Goal Status: Active Interventions: Assess patient nutrition upon admission and as needed per policy Notes: Orientation to the Wound Care Program Nursing Diagnoses: Knowledge deficit related to the wound healing center program Goals: Patient/caregiver will verbalize understanding of the Wound Healing Center Program Date Initiated: 03/13/2019 Target Resolution Date: 04/11/2019 Goal Status: Active Interventions: Provide education on orientation to the wound center Notes: Wound/Skin Impairment Nursing Diagnoses: EULIS, SALAZAR ([id number]) Impaired tissue integrity Goals: Ulcer/skin breakdown will have a volume reduction of 30% by week 4 Date Initiated: 03/13/2019 Target Resolution Date: 04/11/2019 Goal Status: Active Interventions: Assess ulceration(s) every visit Notes: Electronic Signature(s) Signed: 01/05/2020 3:19:28 PM By: Elliot Gurney, BSN, RN, CWS, Kim RN, BSN Entered By: Elliot Gurney, BSN, RN, CWS, Kim on 01/05/2020 12:47:31 [name] ([id number]) -------------------------------------------------------------------------------- Pain Assessment Details Patient Name: [name] Date of Service: 01/05/2020 2:00 PM Medical Record Number: [id number] Patient Account Number: [id number] Date of Birth/Sex: October 28, 1965 (55 y.o. M) Treating RN: Huel Coventry Primary Care Londen Bok: Larina Earthly Other Clinician: Referring Tawan Corkern: Larina Earthly Treating Yale Golla/Extender: Rowan Blase in  Treatment: 42 Active Problems Location of Pain Severity and Description of Pain Patient Has Paino No Site Locations Pain Management and Medication Current Pain Management: Notes Patient denies pain at this time. Electronic Signature(s) Signed: 01/05/2020 3:19:28 PM By: Elliot Gurney, BSN, RN, CWS, Kim RN, BSN Entered By: Elliot Gurney, BSN, RN, CWS, Kim on 01/05/2020 12:42:34 [name] ([id number]) -------------------------------------------------------------------------------- Patient/Caregiver Education Details Patient Name: [name], [name] Date of Service: 01/05/2020 2:00 PM Medical Record Number: [id number] Patient Account Number: [id number] Date of Birth/Gender: Dec 01, 1965 (55 y.o. M) Treating RN: Huel Coventry Primary Care Physician: Larina Earthly Other Clinician: Referring Physician: Larina Earthly Treating Physician/Extender: Rowan Blase in Treatment: 48 Education Assessment Education Provided To: Patient Education Topics Provided Wound/Skin Impairment: Handouts: Caring for Your Ulcer Methods: Demonstration, Explain/Verbal Responses: State content correctly Electronic Signature(s) Signed: 01/05/2020 3:19:28 PM By: Elliot Gurney, BSN, RN, CWS, Kim RN, BSN Entered By: Elliot Gurney, BSN, RN, CWS, Kim on 01/05/2020 13:03:11 [name] ([id number]) -------------------------------------------------------------------------------- Wound Assessment Details Patient Name: [name], [name]. Date of Service: 01/05/2020 2:00 PM Medical Record Number: [id number] Patient Account Number: [id number] Date of Birth/Sex: 08/08/1965 (55 y.o. M) Treating RN: Huel Coventry Primary Care  Reighn Kaplan: Berneta Sages Other Clinician: Referring Meaghann Choo: Berneta Sages Treating Kalina Morabito/Extender: Skipper Cliche in Treatment: 42 Wound Status Wound Number: [id number] Primary Etiology: Diabetic Wound/Ulcer of the Lower Extremity Wound Location: Right, Lateral Foot Wound Status: Open Wounding Event: Gradually  Appeared Comorbid History: Hypertension, Type II Diabetes, Neuropathy Date Acquired: 03/12/2019 Weeks Of Treatment: 42 Clustered Wound: No Pending Amputation On Presentation Photos Wound Measurements Length: (cm) 0.4 Width: (cm) 0.5 Depth: (cm) 0.2 Area: (cm) 0.157 Volume: (cm) 0.031 % Reduction in Area: 75.3% % Reduction in Volume: 75.6% Epithelialization: Small (1-33%) Wound Description Classification: Grade 1 Foul Od Wound Margin: Thickened Slough/ Exudate Amount: Small Exudate Type: Serosanguineous Exudate Color: red, brown or After Cleansing: No Fibrino Yes Wound Bed Granulation Amount: Large (67-100%) Exposed Structure Granulation Quality: Red Fascia Exposed: No Necrotic Amount: Small (1-33%) Fat Layer (Subcutaneous Tissue) Exposed: Yes Necrotic Quality: Adherent Slough Tendon Exposed: No Muscle Exposed: No Joint Exposed: No Bone Exposed: No Treatment Notes Wound #4R (Right, Lateral Foot) Notes endoform, dry gauze, offloading felt, conform [name], [name] ([id number]) Electronic Signature(s) Signed: 01/05/2020 3:19:28 PM By: Gretta Cool, BSN, RN, CWS, Kim RN, BSN Entered By: Gretta Cool, BSN, RN, CWS, Kim on 01/05/2020 12:46:57 [name] ([id number]) -------------------------------------------------------------------------------- Vitals Details Patient Name: [name] Date of Service: 01/05/2020 2:00 PM Medical Record Number: [id number] Patient Account Number: [id number] Date of Birth/Sex: 12-15-1965 (55 y.o. M) Treating RN: Cornell Barman Primary Care Robb Sibal: Berneta Sages Other Clinician: Referring Shemaiah Round: Berneta Sages Treating Gwynevere Lizana/Extender: Skipper Cliche in Treatment: 42 Vital Signs Time Taken: 12:39 Temperature (F): 98.4 Height (in): 74 Pulse (bpm): 93 Weight (lbs): 308 Respiratory Rate (breaths/min): 16 Body Mass Index (BMI): 39.5 Blood Pressure (mmHg): 155/80 Reference Range: 80 - 120 mg / dl Notes Blood sugar this morning  136 Electronic Signature(s) Signed: 01/05/2020 3:19:28 PM By: Gretta Cool, BSN, RN, CWS, Kim RN, BSN Entered By: Gretta Cool, BSN, RN, CWS, Kim on 01/05/2020 12:41:08

## 2020-01-12 NOTE — Progress Notes (Shared)
[facility] Note  01/14/2020     CHIEF COMPLAINT Patient presents for No chief complaint on file.   HISTORY OF PRESENT ILLNESS: [name] is a 55 y.o. male who presents to the clinic today for:   pt is here on the referral of Dr. Genevie Ann for concern of PDR OU, pt is previous pt of Dr. Baird Cancer, pt has had PRP and surgery (to cauterize blood vessels) with Dr. Baird Cancer (2011/2015), he states he has not had any issues with his vision since then, last visit with Dr. Baird Cancer was 2015, pt states last A1c was 7.9  Referring physician: [place], Heather OD [phone] [place], [place] [address]  HISTORICAL INFORMATION:   Selected notes from the MEDICAL RECORD NUMBER Referred by Dr. [place] LEE: 06.02.2021 BCVA OD: 20/40+2 OS: 20/30 Ocular Hx-laser with Dr. Baird Cancer for DR 2015 PMH-DM, HTN, cholesterol    CURRENT MEDICATIONS: No current outpatient medications on file. (Ophthalmic Drugs)   No current facility-administered medications for this visit. (Ophthalmic Drugs)   Current Outpatient Medications (Other)  Medication Sig  . Continuous Blood Gluc Sensor (FREESTYLE LIBRE 14 DAY SENSOR) MISC SMARTSIG:1 Topical Every 2 Weeks  . empagliflozin (JARDIANCE) 25 MG TABS tablet Take 25 mg by mouth daily.  . fluticasone (FLONASE ALLERGY RELIEF) 50 MCG/ACT nasal spray Place 2 sprays into both nostrils daily as needed for allergies or rhinitis.  Marland Kitchen glimepiride (AMARYL) 2 MG tablet Take 2 mg by mouth 2 (two) times daily.  . Insulin Degludec (TRESIBA FLEXTOUCH) 200 UNIT/ML SOPN Inject 100 Units into the skin daily before breakfast.   . levofloxacin (LEVAQUIN) 500 MG tablet Take 500 mg by mouth daily.  . metFORMIN (GLUCOPHAGE) 1000 MG tablet Take 1,000 mg by mouth 2 (two) times daily.  . Multiple Vitamin (MULTIVITAMIN) tablet Take 1 tablet by mouth daily.  . Olmesartan-Amlodipine-HCTZ 40-10-25 MG TABS Take 1 tablet by mouth daily. Reported on 02/04/2015  .  omega-3 acid ethyl esters (LOVAZA) 1 g capsule Take 1 capsule by mouth 2 (two) times daily.  . rosuvastatin (CRESTOR) 10 MG tablet Take 10 mg by mouth daily.   No current facility-administered medications for this visit. (Other)      REVIEW OF SYSTEMS:    ALLERGIES No Known Allergies  PAST MEDICAL HISTORY Past Medical History:  Diagnosis Date  . Broken ankle   . Broken arm   . Diabetes mellitus without complication ([place])    type 2  . Diabetic retinopathy ([place])    PDR OU  . History of Bell's palsy   . Hyperlipidemia   . Hypertension   . Osteomyelitis ([place])    foot   Past Surgical History:  Procedure Laterality Date  . AMPUTATION Right 03/04/2015   Procedure: PARTIAL AMPUTATION RIGHT 5TH METATARSAL;  Surgeon: Landis Martins, DPM;  Location: [place] [facility];  Service: Podiatry;  Laterality: Right;  . APPENDECTOMY    . COLONOSCOPY  11/17/2005   TAs - [place]  . COLONOSCOPY N/A 01/25/2019   Procedure: COLONOSCOPY;  Surgeon: Juanita Craver, MD;  Location[facility] ENDOSCOPY;  Service: Endoscopy;  Laterality: N/A;  . EYE SURGERY Bilateral    blood vessels -cautery  . HEMOSTASIS CLIP PLACEMENT  01/25/2019   Procedure: HEMOSTASIS CLIP PLACEMENT;  Surgeon: Juanita Craver, MD;  Location: [facility] ENDOSCOPY;  Service: Endoscopy;;  . I & D EXTREMITY Right 03/04/2015   Procedure: IRRIGATION AND DEBRIDEMENT RIGHT FOOT;  Surgeon: Landis Martins, DPM;  Location: [place];  Service: Podiatry;  Laterality: Right;  . WISDOM TOOTH EXTRACTION     only 2 ext    FAMILY HISTORY Family History  Problem Relation Age of Onset  . Diabetes Father   . Colon cancer Father   . Colon polyps Father   . Esophageal cancer Neg Hx   . Rectal cancer Neg Hx   . Stomach cancer Neg Hx     SOCIAL HISTORY Social History   Tobacco Use  . Smoking status: Never Smoker  . Smokeless tobacco: Never Used  Vaping Use  . Vaping Use: Never used  Substance Use Topics  . Alcohol use: No  . Drug use: No         OPHTHALMIC  EXAM:  Not recorded     IMAGING AND PROCEDURES  Imaging and Procedures for 01/14/2020           ASSESSMENT/PLAN:  No diagnosis found.  1,2. 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 Baird Cancer -- pt has not been seen at [facility] since 2015  - history of extensive PRP OU and PPV OU ~2011-2015 - The incidence, risk factors for progression, natural history and treatment options for diabetic retinopathy were discussed with patient.   - The need for close monitoring of blood glucose, blood pressure, and serum lipids, avoiding cigarette or any type of tobacco, and the need for long term follow up was also discussed with patient. - exam shows extensive PRP and no active NV, minimal retinopathy - FA today (07.14.21) shows late-leaking MA, vascular nonperfusion, no NV OU - OCT without diabetic macular edema, both eyes  - discussed findings and prognosis - f/u in 4 months, DFE, OCT  3,4. Hypertensive retinopathy OU - discussed importance of tight BP control - monitor  5. Mixed Cataract OU - The symptoms of cataract, surgical options, and treatments and risks were discussed with patient. - discussed diagnosis and progression - not yet visually significant - monitor for now    Ophthalmic Meds Ordered this visit:  No orders of the defined types were placed in this encounter.      No follow-ups on file.  There are no Patient Instructions on file for this visit.   Explained the diagnoses, plan, and follow up with the patient and they expressed understanding.  Patient expressed understanding of the importance of proper follow up care.   This document serves as a record of services personally performed by Gardiner Sleeper, MD, PhD. It was created on their behalf by Roselee Nova, COMT. The creation of this record is the provider's dictation and/or activities during the visit.  Electronically signed by: Roselee Nova, COMT 01/12/20 10:57  AM  Gardiner Sleeper, M.D., Ph.D. Diseases & Surgery of the Retina and Vitreous [facility]: M myopia (nearsighted); A astigmatism; H hyperopia (farsighted); P presbyopia; Mrx spectacle prescription;  CTL contact lenses; OD right eye; OS left eye; OU both eyes  XT exotropia; ET esotropia; PEK punctate epithelial keratitis; PEE punctate epithelial erosions; DES dry eye syndrome; MGD meibomian gland dysfunction; ATs artificial tears; PFAT's preservative free artificial tears; [place] nuclear sclerotic cataract; PSC posterior subcapsular cataract; ERM epi-retinal membrane; PVD posterior vitreous detachment; RD retinal detachment; DM diabetes mellitus; DR diabetic retinopathy; NPDR non-proliferative diabetic retinopathy; PDR proliferative diabetic retinopathy; CSME clinically significant macular edema; DME diabetic macular edema; dbh dot blot hemorrhages; CWS cotton wool spot; POAG primary open angle glaucoma; C/D cup-to-disc ratio; HVF humphrey visual field;  GVF goldmann visual field; OCT optical coherence tomography; IOP intraocular pressure; BRVO Branch retinal vein occlusion; CRVO central retinal vein occlusion; CRAO central retinal artery occlusion; BRAO branch retinal artery occlusion; RT retinal tear; SB scleral buckle; PPV pars plana vitrectomy; VH Vitreous hemorrhage; PRP panretinal laser photocoagulation; IVK intravitreal kenalog; VMT vitreomacular traction; MH Macular hole;  NVD neovascularization of the disc; NVE neovascularization elsewhere; AREDS age related eye disease study; ARMD age related macular degeneration; POAG primary open angle glaucoma; EBMD epithelial/anterior basement membrane dystrophy; ACIOL anterior chamber intraocular lens; IOL intraocular lens; PCIOL posterior chamber intraocular lens; Phaco/IOL phacoemulsification with intraocular lens placement; [place] photorefractive keratectomy; LASIK laser assisted in situ keratomileusis; HTN hypertension; DM  diabetes mellitus; COPD chronic obstructive pulmonary disease

## 2020-01-13 ENCOUNTER — Other Ambulatory Visit: Payer: Self-pay

## 2020-01-13 ENCOUNTER — Encounter: Payer: Managed Care, Other (non HMO) | Admitting: Physician Assistant

## 2020-01-13 DIAGNOSIS — E11621 Type 2 diabetes mellitus with foot ulcer: Secondary | ICD-10-CM | POA: Diagnosis not present

## 2020-01-13 NOTE — Progress Notes (Signed)
CAELUM, FEDERICI ([id number]) Visit Report for 01/13/2020 Arrival Information Details Patient Name: [name], [name]. Date of Service: 01/13/2020 12:30 PM Medical Record Number: [id number] Patient Account Number: [id number] Date of Birth/Sex: 10-04-1965 (55 y.o. M) Treating RN: Dolan Amen Primary Care Donaldson Richter: Berneta Sages Other Clinician: Referring Ellison Leisure: Berneta Sages Treating Reyah Streeter/Extender: Skipper Cliche in Treatment: 48 Visit Information History Since Last Visit Pain Present Now: No Patient Arrived: Ambulatory Arrival Time: 12:38 Accompanied By: self Transfer Assistance: None Patient Identification Verified: Yes Secondary Verification Process Completed: Yes Patient Requires Transmission-Based Precautions: No Patient Has Alerts: Yes Patient Alerts: DMII Electronic Signature(s) Signed: 01/13/2020 4:58:00 PM By: Georges Mouse, Minus Breeding RN Entered By: Georges Mouse, Minus Breeding on 01/13/2020 12:38:47 [name] ([id number]) -------------------------------------------------------------------------------- Encounter Discharge Information Details Patient Name: [name] Date of Service: 01/13/2020 12:30 PM Medical Record Number: [id number] Patient Account Number: [id number] Date of Birth/Sex: September 24, 1965 (55 y.o. M) Treating RN: Dolan Amen Primary Care Janace Decker: Berneta Sages Other Clinician: Referring Magaline Steinberg: Berneta Sages Treating Jari Dipasquale/Extender: Skipper Cliche in Treatment: 89 Encounter Discharge Information Items Post Procedure Vitals Discharge Condition: Stable Temperature (F): 98.5 Ambulatory Status: Ambulatory Pulse (bpm): 80 Discharge Destination: Home Respiratory Rate (breaths/min): 18 Transportation: Private Auto Blood Pressure (mmHg): 138/74 Accompanied By: self Schedule Follow-up Appointment: Yes Clinical Summary of Care: Electronic Signature(s) Signed: 01/13/2020 4:58:00 PM By: Georges Mouse, Minus Breeding RN Entered By: Georges Mouse, Minus Breeding on 01/13/2020 13:20:42 [name] ([id number]) -------------------------------------------------------------------------------- Lower Extremity Assessment Details Patient Name: [name] Date of Service: 01/13/2020 12:30 PM Medical Record Number: [id number] Patient Account Number: [id number] Date of Birth/Sex: [birth year] (55 y.o. M) Treating RN: Dolan Amen Primary Care Takima Encina: Berneta Sages Other Clinician: Referring Ciel Chervenak: Berneta Sages Treating Dhanvi Boesen/Extender: Jeri Cos Weeks in Treatment: 74 Edema Assessment Assessed: [Left: No] [Right: Yes] Edema: [Left: N] [Right: o] Vascular Assessment Pulses: Dorsalis Pedis Palpable: [Right:Yes] Electronic Signature(s) Signed: 01/13/2020 4:58:00 PM By: Georges Mouse, Minus Breeding RN Entered By: Georges Mouse, Minus Breeding on 01/13/2020 12:48:33 [name] ([id number]) -------------------------------------------------------------------------------- [place] Details Patient Name: [name]. Date of Service: 01/13/2020 12:30 PM Medical Record Number: [id number] Patient Account Number: [id number] Date of Birth/Sex: [birth year] (55 y.o. M) Treating RN: Dolan Amen Primary Care Debria Broecker: Berneta Sages Other Clinician: Referring Adriene Padula: Berneta Sages Treating Dolly Harbach/Extender: Skipper Cliche in Treatment: 43 Active Inactive Necrotic Tissue Nursing Diagnoses: Impaired tissue integrity related to necrotic/devitalized tissue Knowledge deficit related to management of necrotic/devitalized tissue Goals: Necrotic/devitalized tissue will be minimized in the wound bed Date Initiated: 11/25/2019 Target Resolution Date: 12/11/2019 Goal Status: Active Patient/caregiver will verbalize understanding of reason and process for debridement of necrotic tissue Date Initiated: 11/25/2019 Target Resolution Date: 12/11/2019 Goal Status: Active Interventions: Assess patient pain level  pre-, during and post procedure and prior to discharge Treatment Activities: Excisional debridement : 11/25/2019 Notes: Nutrition Nursing Diagnoses: Impaired glucose control: actual or potential Goals: Patient/caregiver verbalizes understanding of need to maintain therapeutic glucose control per primary care physician Date Initiated: 03/13/2019 Target Resolution Date: 04/11/2019 Goal Status: Active Interventions: Assess patient nutrition upon admission and as needed per policy Notes: Orientation to the Wound Care Program Nursing Diagnoses: Knowledge deficit related to the wound healing center program Goals: Patient/caregiver will verbalize understanding of the [facility] Date Initiated: 03/13/2019 Target Resolution Date: 04/11/2019 Goal Status: Active Interventions: Provide education on orientation to the wound center Notes: Wound/Skin Impairment Nursing Diagnoses: RUE, TINNEL ([id number]) Impaired tissue integrity Goals: Ulcer/skin breakdown will have a volume reduction of 30% by  week 4 Date Initiated: 03/13/2019 Target Resolution Date: 04/11/2019 Goal Status: Active Interventions: Assess ulceration(s) every visit Notes: Electronic Signature(s) Signed: 01/13/2020 4:58:00 PM By: Georges Mouse, Minus Breeding RN Entered By: Georges Mouse, Minus Breeding on 01/13/2020 12:55:31 [name] ([id number]) -------------------------------------------------------------------------------- Pain Assessment Details Patient Name: [name]. Date of Service: 01/13/2020 12:30 PM Medical Record Number: [id number] Patient Account Number: [id number] Date of Birth/Sex: 01-18-1965 (55 y.o. M) Treating RN: Dolan Amen Primary Care Finesse Fielder: Berneta Sages Other Clinician: Referring Teagyn Fishel: Berneta Sages Treating Ledell Codrington/Extender: Skipper Cliche in Treatment: 15 Active Problems Location of Pain Severity and Description of Pain Patient Has Paino No Site  Locations Rate the pain. Current Pain Level: 0 Pain Management and Medication Current Pain Management: Electronic Signature(s) Signed: 01/13/2020 4:58:00 PM By: Georges Mouse, Minus Breeding RN Entered By: Georges Mouse, Minus Breeding on 01/13/2020 12:40:08 [name] ([id number]) -------------------------------------------------------------------------------- Patient/Caregiver Education Details Patient Name: [name] Date of Service: 01/13/2020 12:30 PM Medical Record Number: [id number] Patient Account Number: [id number] Date of Birth/Gender: 10-20-1965 (55 y.o. M) Treating RN: Dolan Amen Primary Care Physician: Berneta Sages Other Clinician: Referring Physician: Berneta Sages Treating Physician/Extender: Skipper Cliche in Treatment: 9 Education Assessment Education Provided To: Patient Education Topics Provided Offloading: Methods: Explain/Verbal Responses: State content correctly Wound/Skin Impairment: Methods: Explain/Verbal Responses: State content correctly Electronic Signature(s) Signed: 01/13/2020 4:58:00 PM By: Georges Mouse, Minus Breeding RN Entered By: Georges Mouse, Minus Breeding on 01/13/2020 13:19:44 [name] ([id number]) -------------------------------------------------------------------------------- Wound Assessment Details Patient Name: [name] Date of Service: 01/13/2020 12:30 PM Medical Record Number: [id number] Patient Account Number: [id number] Date of Birth/Sex: April 30, 1965 (55 y.o. M) Treating RN: Dolan Amen Primary Care Klay Sobotka: Berneta Sages Other Clinician: Referring Jariana Shumard: Berneta Sages Treating Keira Bohlin/Extender: Skipper Cliche in Treatment: 44 Wound Status Wound Number: 4R Primary Etiology: Diabetic Wound/Ulcer of the Lower Extremity Wound Location: Right, Lateral Foot Wound Status: Open Wounding Event: Gradually Appeared Comorbid History: Hypertension, Type II Diabetes, Neuropathy Date Acquired: 03/12/2019 Weeks  Of Treatment: 43 Clustered Wound: No Pending Amputation On Presentation Photos Wound Measurements Length: (cm) 0.4 Width: (cm) 0.5 Depth: (cm) 0.3 Area: (cm) 0.157 Volume: (cm) 0.047 % Reduction in Area: 75.3% % Reduction in Volume: 63% Epithelialization: Small (1-33%) Tunneling: No Undermining: No Wound Description Classification: Grade 1 Wound Margin: Thickened Exudate Amount: Small Exudate Type: Serosanguineous Exudate Color: red, brown Foul Odor After Cleansing: No Slough/Fibrino Yes Wound Bed Granulation Amount: Large (67-100%) Exposed Structure Granulation Quality: Red Fascia Exposed: No Necrotic Amount: Small (1-33%) Fat Layer (Subcutaneous Tissue) Exposed: Yes Necrotic Quality: Adherent Slough Tendon Exposed: No Muscle Exposed: No Joint Exposed: No Bone Exposed: No Treatment Notes Wound #4R (Foot) Wound Laterality: Right, Lateral Cleanser [place] Ancillary Kit - 15 Day Supply Discharge Instruction: Use supplies as instructed; Kit contains: (15) Saline Bullets; (15) 3x3 Gauze; 15 pr Gloves Normal Saline OLUWADAMILARE, TOBLER ([id number]) Discharge Instruction: Wash your hands with soap and water. Remove old dressing, discard into plastic bag and place into trash. Cleanse the wound with Normal Saline prior to applying a clean dressing using gauze sponges, not tissues or cotton balls. Do not scrub or use excessive force. Pat dry using gauze sponges, not tissue or cotton balls. Peri-Wound Care Topical Primary Dressing Endoform 2x2 (in/in) Discharge Instruction: Apply Endoform as directed Secondary Dressing Gauze Discharge Instruction: As directed: dry, moistened with saline or moistened with Dakins Solution Secured With Conforming Stretch Gauze Bandage 4x75 (in/in) Discharge Instruction: Apply as directed Compression Wrap Compression Stockings Add-Ons Electronic Signature(s) Signed: 01/13/2020 4:58:00 PM By:  Georges Mouse, Kenia RN Entered By: Georges Mouse, Kenia on 01/13/2020 12:47:38 [name] ([id number]) -------------------------------------------------------------------------------- Vitals Details Patient Name: [name] Date of Service: 01/13/2020 12:30 PM Medical Record Number: [id number] Patient Account Number: [id number] Date of Birth/Sex: 14-Sep-1965 (55 y.o. M) Treating RN: Dolan Amen Primary Care Jamahl Lemmons: Berneta Sages Other Clinician: Referring Kelyn Ponciano: Berneta Sages Treating Satori Krabill/Extender: Skipper Cliche in Treatment: 39 Vital Signs Time Taken: 12:38 Temperature (F): 98.5 Height (in): 74 Pulse (bpm): 80 Weight (lbs): 308 Respiratory Rate (breaths/min): 18 Body Mass Index (BMI): 39.5 Blood Pressure (mmHg): 138/74 Reference Range: 80 - 120 mg / dl Electronic Signature(s) Signed: 01/13/2020 4:58:00 PM By: Georges Mouse, Minus Breeding RN Entered By: Georges Mouse, Minus Breeding on 01/13/2020 12:40:01

## 2020-01-13 NOTE — Progress Notes (Addendum)
CLANCY, MULLARKEY ([id number]) Visit Report for 01/13/2020 Chief Complaint Document Details Patient Name: [name], [name]. Date of Service: 01/13/2020 12:30 PM Medical Record Number: [id number] Patient Account Number: [id number] Date of Birth/Sex: 03-01-1965 (55 y.o. M) Treating RN: Cornell Barman Primary Care Provider: Berneta Sages Other Clinician: Referring Provider: Berneta Sages Treating Provider/Extender: Skipper Cliche in Treatment: 37 Information Obtained from: Patient Chief Complaint Right lateral foot ulcer Electronic Signature(s) Signed: 01/13/2020 12:54:07 PM By: [name] PA-C Entered By: [name] on 01/13/2020 12:54:07 [name] ([id number]) -------------------------------------------------------------------------------- Debridement Details Patient Name: [name] Date of Service: 01/13/2020 12:30 PM Medical Record Number: [id number] Patient Account Number: [id number] Date of Birth/Sex: 12-Aug-1965 (55 y.o. M) Treating RN: Dolan Amen Primary Care Provider: Berneta Sages Other Clinician: Referring Provider: Berneta Sages Treating Provider/Extender: Skipper Cliche in Treatment: 43 Debridement Performed for Wound #4R Right,Lateral Foot Assessment: Performed By: Physician Tommie Sams., PA-C Debridement Type: Debridement Severity of Tissue Pre Debridement: Fat layer exposed Level of Consciousness (Pre- Awake and Alert procedure): Pre-procedure Verification/Time Out Yes - 12:57 Taken: Start Time: 12:57 Pain Control: Lidocaine 4% Topical Solution Total Area Debrided (L x W): 0.4 (cm) x 0.5 (cm) = 0.2 (cm) Tissue and other material Viable, Non-Viable, Callus, Subcutaneous debrided: Level: Skin/Subcutaneous Tissue Debridement Description: Excisional Instrument: Curette Bleeding: Minimum Hemostasis Achieved: Pressure Procedural Pain: 0 Post Procedural Pain: 0 Response to Treatment: Procedure was tolerated well Level of  Consciousness (Post- Awake and Alert procedure): Post Debridement Measurements of Total Wound Length: (cm) 0.5 Width: (cm) 0.6 Depth: (cm) 0.3 Volume: (cm) 0.071 Character of Wound/Ulcer Post Debridement: Stable Severity of Tissue Post Debridement: Fat layer exposed Post Procedure Diagnosis Same as Pre-procedure Electronic Signature(s) Signed: 01/13/2020 4:32:22 PM By: [name] PA-C Signed: 01/13/2020 4:58:00 PM By: Georges Mouse, Minus Breeding RN Entered By: Georges Mouse, Minus Breeding on 01/13/2020 13:02:12 [name] ([id number]) -------------------------------------------------------------------------------- HPI Details Patient Name: [name] Date of Service: 01/13/2020 12:30 PM Medical Record Number: [id number] Patient Account Number: [id number] Date of Birth/Sex: [birth year] (55 y.o. M) Treating RN: Cornell Barman Primary Care Provider: Berneta Sages Other Clinician: Referring Provider: Berneta Sages Treating Provider/Extender: Skipper Cliche in Treatment: 62 History of Present Illness HPI Description: This 55 year old male comes with an ulcerated area on the plantar aspect of the right foot which she's had for approximately a month. I have known him from a previous visit at [facility] wound [facility] and was treated in the months of April and May 2016 and rapidly healed a left plantar ulcer with a total contact cast. He has been a diabetic for about 16 years and tries to keep active and is fairly compliant with his diabetes management. 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 Past medical history significant for hypertension, hyperlipidemia, and status post appendectomy 1993. He does not smoke or drink alcohol. 01/14/2015 -- the patient had tolerated his total contact cast very well and had no problems and has had no systemic symptoms. However when his total contact cast was cut open he had excessive amount of purulent drainage in spite of being on antibiotics. He  had had a recent x-ray done in the ER 12/22/2014 which showed IMPRESSION:No evidence of osseous erosion. Known soft tissue ulceration is not well characterized on radiograph. Scattered vascular calcifications seen. his last hemoglobin A1c in December was 7.3. He has been on Augmentin and doxycycline for the last 2 weeks. 01/21/2015 -- his culture grew rare growth of Pantoea species an MR moderate growth  of Candida parapsilosis. it is sensitive to levofloxacin. He has not heard back from the insurance company regarding his hyperbaric oxygen therapy. His MRI has not been done yet and we will try and get him an earlier date 01/28/2015 -- MRI was done last night -- IMPRESSION:1. Soft tissue ulcer overlying the plantar aspect of the fifth metatarsal head extending to the cortex. Subcortical marrow edema in the fifth metatarsal head with corresponding T1 hypointensity is concerning for early osteomyelitis of the plantar lateral aspect of the fifth metatarsal head. Chest x-ray done on 01/14/2015 shows bronchiectatic changes without infiltrate. EKG done on generally 17 2017 shows a normal sinus rhythm and is a normal EKG. 02/04/2015 -- he was asked to see Dr. [name] last week and had 2 appointments but had to cancel both due to pressures of work. Last night he has woken up with severe pain in the foot and leg and it is swollen up. No fever or no change in his blood glucose. Addendum: I spoke with Dr. [name] who kindly agreed to accept the patient for inpatient therapy and have also opened to the hospitalist Dr. [name], and discuss details of the management including PICC line and repeat cultures. 02/12/2015-- -- was seen by Dr. [name] in the hospital and a PICC line was placed. He was to receive Ceftazidime 2 g every 12 hourly, oral levofloxacin 750 mg every 24 hourly and oral fluconazole 200 mg daily. The antibiotics were to be given for 4 weeks except the Diflucan was to be given for the first 2  weeks. Reviewed note from 02/10/2015 -- and Dr. [name] had recommended management for growth of MSSA and Serratia. He switched him from ceftazidime to ceftriaxone 2 g every 24 hours. Levofloxacin was stopped and he would continue on fluconazole for another week. He had asked me to decide whether further imaging was necessary and whether surgical debridement of the infected bone was needed. He is doing well and has been off work for this week and we will keep him off the next week. 02/22/2015 -- he was seen by my colleague on 01/19/2015 and at that time an incision and drainage was done on his right lateral forefoot on the dorsum. Today when I probed this wound it is frankly draining pus and it communicates with the ulcer on the plantar aspect of his right foot. The patient is still on IV ceftriaxone 2 g every 24 hours and is to be seen by Dr. [name] on Friday. 03/19/2015 -- On 03/04/2015 I spoke to Dr. 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 He recommended operative debridement which would probably result in the fifth metatarsal head and toe amputation.The patient would be referred back to [place] once he was done with surgery. He was admitted to [facility] yesterday and had surgery done by podiatry for a right fifth metatarsal acute osteomyelitis with cellulitis and abscess. He had a right foot incision and drainage with fifth metatarsal partial amputation and removal of toe infected bone and soft tissue with cultures. The wound was partially closed and packing of the distal end was done. Patient was already on cefepime 2 g IV every 8 hourly and put on vancomycin pending final cultures. He had grown moderate gram-negative rods, and later found to be rare diphtheroids. I received a call from  Dr. Cannon Kettle the podiatrist and we  discussed the above. On 03/10/2015 he was found positive for influenza a and has been put on Tamiflu. Since his discharge he has been seen by Dr. Cannon Kettle who is planning to remove his sutures this coming week. He was reviewed by Dr. [name] on 03/17/2015 and his Diflucan was stopped and vancomycin. After the 3 doses he is taking. He is going to change Ceftazidime to Zosyn 3.375 g IV every 8 hours. 03/25/2015 -- he was seen by the podiatrist a couple of days ago and the sutures were removed. She will follow back with him in 4 weeks' time and at that time x-rays will be taken and a custom molded insert would be made for his shoe. 04/01/2015 -- he was seen by Dr. [name] on 03/29/2015 who pulled the PICC line stopped his IV antibiotics and recommended starting doxycycline and levofloxacin for 2 weeks. He also stop the fluconazole. The patient will follow up with him only when necessary. Readmission: 03/15/17 on evaluation today patient presents for initial evaluation concerning the new issues although he has previously been evaluated in our clinic. Unfortunately the previous evaluation led to the patient having to proceed to amputation and so he is somewhat nervous about being here SALVADOR, COUPE ([id number]) today. With that being said he has a very slight blister that occurred on the plantar aspect more medial on the right great toe that has been present for just a very short amount of time, several days. With that being said he felt like initially when he called that he somewhat overreacted but due to the fact that his previous issue led to amputation he is very cautious these days I explained that he did the right thing. With that being said he has been tolerating the dressing changes without complication mainly he just been covering this. He does not have any discomfort in secondary to neuropathy it's unlikely that he feels much. With that being said he does tell me that the issue that  he had here is that he went barefoot when he knows he should not have which subsequently led to the blisters. He states is definitely not doing that anymore. His most recent hemoglobin A1c was December and registered 6.9 his ABI today was 1.1. 03/27/17 on evaluation today patient appears to be doing great in regard to his right great toe ulcer. He has been tolerating the dressing changes without complication. The good news is this is making excellent progress and he seems to be caring for this in an excellent fashion. I see no evidence of breakdown that would make me concerned that he was at risk for infection/amputation. This has obviously been his concern due to the fifth toe amputation that was necessitated previous when he had a similar issue. Nonetheless this seems to be progressing much more nicely. Readmission: 03/13/2019 upon evaluation today patient presents for reevaluation here in the clinic concerning issues he has been having with his right foot on the lateral portion at the proximal end of the metatarsal. Subsequently he tells me that this just opened up in the past couple of days or so and the erythema really began about 24 to 48 hours ago. Fortunately there is no signs of systemic infection. 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 That left him with this bony prominence that is where the wound is currently. There is erythema surrounding that does have me concerned about cellulitis. With  that being said he was noncompressible as far as ABIs are concerned I do think we can need to check into arterial studies at this point. Patient does have a history of hypertension along with diabetes mellitus type 2. He also tends to develop quite a bit of callus white often. 03/20/2019 upon evaluation today patient appears to be doing very well in regard to his wounds currently. He has been tolerating the dressing changes without complication.  Fortunately there is no signs of infection. His ABIs were good at this point and registered at 1.07 on the left and 1.09 on the right. In regard to the x-ray this was negative as well for any signs of osteomyelitis. The patient also seems to be doing better in regard to the infection. He still has several days of the antibiotic left at this point best the Bactrim DS and he seems to be doing very well with this. Overall I am extremely pleased with the progress in 1 week's time he is going require some sharp debridement today however. 03/27/2019 upon evaluation today patient actually appears to be making some progress in regard to his wound. It is a little deeper but measuring smaller which is good news due to the fact that again were clear away some of the necrotic tissue which is why the depth is increasing a little bit. Nonetheless after like were getting very close to being down at a good wound bed. Fortunately there is no signs of active infection at this time. There is a little bit of erythema immediately surrounding the wound that we do want to be very cognizant of and careful about. For that reason I am going to go ahead and extend his antibiotic today for an additional 10 days that is the Bactrim. 04/03/2019 upon evaluation today patient appears to be making some progress. I do feel like the Annitta Needs is doing a good job along with the alginate is being packed into the wound space behind. He has been tolerating the dressing changes without complication. Fortunately there is no signs of active infection at this time. No fevers, chills, nausea, vomiting, or diarrhea. 04/10/2019 upon evaluation today patient appears to be making progress. The wound is not quite as deep but he still does not have a great wound surface yet for working on this and the Santyl does seem to be cleaning things up. Fortunately there is no evidence of active infection at this time. No fevers, chills, nausea, vomiting, or  diarrhea. 04/17/2019 upon evaluation today patient appears to be doing excellent at this time in regard to his foot ulcer. I do believe the Santyl with the alginate packed in behind has been of benefit for him and I am very pleased in this regard. With that being said I am feeling like the wound surface is improving quite a bit each time I see him still I do not believe we are at the point of stating that the wound bed is perfect but again were making progress. The patient is going require some sharp debridement today. 4/22; this is a patient who is using Santyl with calcium alginate. Apparently his wound dimensions have been improving. Patient is changing the dressing himself. He has a surgical shoe. He works in a sitting position therefore is not on his feet all that much. There is undermining 05/01/2019 upon evaluation today patient appears to be doing a little better in regard to the size of his wound though he still has some depth. This does seem  to be much cleaner has been using Santyl on the base of the wound followed by packing with silver alginate and behind. Nonetheless I do believe that based on what we are seeing we may need to switch to a collagen type dressing endoform may actually be excellent for him. 05/08/2019 upon evaluation today patient's wound actually appears to be showing better granulation tissue in the base of the wound. With that being said he does have some epiboly noted around the edges of the wound especially plantar and more distal. Subsequently this is can require sharp debridement today. Fortunately there is no signs of active systemic infection though I do feel like there is some local cellulitis noted at this point. The patient also notes has had increased drainage. 5/13; wound volume quite a bit improved this week down to 3 mm. Patient states pain and drainage better. Culture from last week grew staph aureus [not MRSA] and this is sensitive to quinolones and he is on  Levaquin. HOWEVER he also grew abundant Enterococcus faecalis treatment of choice for this is ampicillin. Quinolone coverage is unreliable 5/20 completing the Augmentin. Small wound on the right lateral foot. Thick rolled edges around the wound. Using silver alginate 05/29/2019 upon evaluation today patient appears to be doing better compared to last time I saw him. Fortunately there is no signs of active infection and I do feel like he is actually making good progress. Overall the quality of the wound bed along with the size looks improved and the inflammation/erythema that was previously noted by myself when I saw the couple weeks back actually has also resolved and this is great. Overall very pleased with how things stand. 06/06/2019 upon evaluation today patient actually appears to be doing quite well with regard to his foot ulcer with regard to some of the granulation were seen at this point. There does not appear to be any evidence of systemic or local infection although he still has discomfort I feel like things are moving in a slow but surely correct direction. I think we may need to help fill some of the space however even though he is using the collagen we may need to have something behind this such as a plain packing strip to try to help hold the collagen in the base of the wound. 06/12/2019 upon evaluation today patient appears to be doing well with regard to his foot ulcer. This seems to be making some slow but steady progress. The wound does not appear to be as deep today compared to where it was previous. Fortunately there is no signs of active infection at this time. No fevers, chills, nausea, vomiting, or diarrhea. 06/19/2019 on evaluation today patient's wound does not appear to be doing quite as well as what I would like to see. Fortunately there is no signs of systemic infection at this time which is great news. With that being said I do believe that the patient needs to have a wound  culture as I do TENNIS, MCKINNON. ([id number]) believe he likely has a local infection that needs to be addressed. He is also can require some debridement today as well. 06/26/2019 upon evaluation today patient's wound actually appears to be showing signs of excellent improvement today. There does not appear to be any evidence of active infection which is great news. There does not appear to be significant erythema at this point. The patient does have improvement overall in the appearance of the infection he has been taking the Augmentin the  culture came back showing no organism was grown at this point but nonetheless I do believe he has been doing well with the antibiotics I would continue that at this point. 07/03/2019 upon evaluation today patient appears to be doing about the same in regard to his wound. There does not appear to be any evidence of active infection and overall I am pleased with where things stand. The patient is tolerating the dressing changes without complication but it does not appear that they are packing the wound at this time which I think is going to slow down his healing prospects. 07/10/2019 upon evaluation today patient appears to be doing little better in my opinion in regard to the overall moisture collected in the base of the wound. They have been packing this with a little bit of silver alginate and that has done quite well. Overall I am pleased in that regard. Subsequently the redness does not appear to be any worse today I did actually go ahead and marked this since this can be 2 weeks between now and when I see him again I would make sure that if anything changes or worsens he will be able to notice this and go ahead and initiate treatment with the Augmentin. 07/22/2019 patient comes in today a little bit early for evaluation secondary to the fact that he was having issues with increased drainage and concern about infection. With that being said he does appear to have  developed a blister area again proximal to where the original wound opening was in although the wound bed itself actually appears to be doing somewhat better he has had increased drainage and tracking of fluid here due to this blister. Nonetheless I believe that that does need to be cleaned away today but again I am not really certain that I see a lot of evidence for infection at this point. 07/29/2019 on evaluation today patient actually appears to be doing better in regard to his foot ulcer. There is improvement compared to last week which is great news. Nonetheless he does come prepared to apply the total contact cast today which I think would be beneficial for him. He knows he is not supposed to drive he did bring his son with him today in order to drive him. 07/31/2019 upon evaluation today patient appears to be doing well with regard to his wound today. I do not see any signs of infection I do believe that he is definitely showing some signs of improvement here in the office in a couple days as far as the overall erythema and pressure getting to the wound area is concerned. With that being said I do believe that the cast has been of great benefit for him. 08/08/2019 upon evaluation today patient appears to be doing better in regard to his wound. Overall I feel like he is showing signs of improvement. The external measurement is really not bad at all although I did remove some of the callus in order to open up the wound itself the depth is actually coming quite well and I am extremely pleased in that regard I do not think we can to see much improvement externally until we get the internal depth filled in and that again is improved. Fortunately there is no signs of active infection at this time. No fevers, chills, nausea, vomiting, or diarrhea. 08/15/2019 on evaluation today patient actually appears to be doing well with regard to his foot ulcer. This is showing some signs of minimal improvement from  the standpoint of the depth and appearance of the wound as well as the edges of the wound. It is taking its time and to be honest I do believe that he seems to be progressing well. I am happy with the cast and what it is achieving at this point. Have contemplated checking into Dermagraft for Apligraf for the patient but again he does seem to be making fairly good progress in my opinion. If it has not tremendously changed come next week I may consider one of the 2. 08/22/2019 on evaluation today patient appears to be making good progress with regard to his wound I am still very pleased with where things stand today. Overall I think that he is making good improvements week by week and I believe that we are getting continue with the Total contact cast I will likely switch to a silver alginate dressing. 08/29/2019 on evaluation today patient appears to be doing quite well with regard to his wound. Fortunately this is measuring quite small. There is no signs of active infection at this time although he did have a little bit of a callus area just to the side of his foot where we previously noted some fluid collecting underneath that was the case today as well. I did have to remove this. With that being said overall he seems to be doing quite well. 09/05/19 on evaluation today patient appears to be doing very well with regard to his original wound which is actually very small on evaluation today. With that being said he does have a blistered area that's more proximal to where this wound is that has been a little bit more concerned. Fortunately there is no signs of active infection at this time. I'm going to need to remove the blister tissue today. 10 09/12/2019 on evaluation today patient actually appears to be doing much better in regard to his wound. I think he is actually improved more without the cast that he was prior with the cast. Overall very pleased with where things stand today. 09/22/2019 upon  evaluation today patient appears to be doing very well at this time in regard to his foot ulcer. In fact I feel like he is continue to make good progress. I see no signs right now that there is any active infection which is great news. Also see very little evidence of pressure he does have some callus buildup but again this seems to be minimal. 09/29/2019 upon evaluation today patient appears to be doing well with regard to his foot ulcer. He has been tolerating the dressing changes without complication. Fortunately there is no signs of active infection at this time. No fevers, chills, nausea, vomiting, or diarrhea. 10/28/2019 upon evaluation today patient appears to be doing a little bit more poorly than last time I saw him as last time we assumed he was completely healed. Apparently that was not the case or else this is reopened but I tend to believe that this probably has never completely closed internally though it appeared to be. He has been using the offloading felt which seems to have done well there is no signs of redness no evidence of erythema all of which is good news. Nonetheless I am concerned about the fact that he is having issues here with what appears to be a reopening of the wound. 11/04/2019 upon evaluation today patient's wound actually showed signs of fairly good improvement today which is great news the wound bed does not appear to be significantly callus covered  and also does not appear to show any signs of significant infection or really any infection whatsoever. I am very pleased in this regard. In general I think that he is making good progress although this is a lot slower than he would like to see I think we are headed back in the correct direction. He is continuing to use the offloading felt. 11/18/2019 on evaluation today patient appears to be having some callus buildup and cracking that occurred at the wound site. Subsequently actually did have to perform a fairly  significant debridement on the area to clean away some of the callus buildup he tolerated that today with some bleeding with that being said I do feel like the wound appear to be doing much better post debridement. MAKOTO, SELLITTO ([id number]) 11/25/2019 upon evaluation today patient appears to be doing somewhat poorly in regard to his foot ulcer. Unfortunately he is having some issues with cellulitis along the lateral aspect of his foot. Unfortunately he seems to be having some issues today with infection/cellulitis in regard to the foot. It still towards the proximal/dorsal surface of where the wound is and this subsequently is the area that always seems to cause him trouble. 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 I do believe we may need to look into this further to ensure that there is nothing more significant going on considering that he seems to have an ongoing issue with this at this point. 12/02/2019 upon evaluation today patient appears to be doing better in regard to his foot ulcer. This is great news. Fortunately there is no signs of active infection systemically which is also excellent. He has been tolerating the dressing changes without complication. With that being said he does tell me that the Augmentin has been causing him some trouble as far as headaches are concerned since he is been taking it. Is unsure if it is directly related or not. Nonetheless as I explained to him it is possible that this could be something that he is experiencing as a result of the antibiotic. The good news is there are other options that we could switch him to that would work equally well while at the same time seeing if indeed the antibiotic was causing the headaches. He does want to proceed with that. 12/09/2019 on evaluation today patient's wound actually showing some signs of improvement. Fortunately there is no signs of active infection at this time great news. No fever chills noted. He has  been using the alginate which I think has done well for him up to this point. No fevers, chills, nausea, vomiting, or diarrhea. 12/18/2019 on evaluation today patient's wound is actually showing signs of fairly good granulation at this point and epithelization. He does have some callus still covering over the little tunnel off to the side that still seems to be staying open despite the fact that we have been trying to keep the callus away from this is much as possible. Fortunately there is no signs of active infection systemically at this time overall I feel like the patient is making good progress in that regard. There is no erythema whatsoever noted. With that being said were still trying to check into MRI in where we stand in that regard. Apparently has been approved but not scheduled. 12/26/2019 upon evaluation today patient appears to be doing decently well in regard to his wound. He does have some callus buildup beginning to clear this away. Fortunately there is no signs of active infection  at this time. No fevers, chills, nausea, vomiting, or diarrhea. 01/05/2020 upon evaluation today patient appears to be doing much better in regard to his foot ulcer. 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 It also did show that he had no evidence of osteomyelitis of the forefoot which was excellent. 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 Overall the fact that he does not have a bone infection is the ideal thing here. 11/13/2019 upon evaluation today patient appears to be doing about the same in regards to his foot ulcer. Unfortunately he has not shown signs of significant improvement at this time which is unfortunate. I feel like that the area where the remaining metatarsal bone is where he had the fifth ray amputation is  actually a significant pressure point this causing some issues here for him. I may want him to have him go back to the podiatrist to see if there is anything that he could have done to try to clear this up a bit. I do not know if he needs any additional surgery to possibly trim down the bone area or if potentially a better offloading shoe may be the better way to go. I do want to see about their expertise in this regard. Electronic Signature(s) Signed: 01/13/2020 2:21:05 PM By: [name] PA-C Entered By: [name] on 01/13/2020 14:21:05 [name] ([id number]) -------------------------------------------------------------------------------- Physical Exam Details Patient Name: JAYDIEN, PANEPINTO Date of Service: 01/13/2020 12:30 PM Medical Record Number: [id number] Patient Account Number: [id number] Date of Birth/Sex: 17-May-1965 (55 y.o. M) Treating RN: Cornell Barman Primary Care Provider: Berneta Sages Other Clinician: Referring Provider: Berneta Sages Treating Provider/Extender: Skipper Cliche in Treatment: 45 Constitutional Well-nourished and well-hydrated in no acute distress. Respiratory normal breathing without difficulty. Psychiatric this patient is able to make decisions and demonstrates good insight into disease process. Alert and Oriented x 3. pleasant and cooperative. Notes Upon inspection patient's wound bed actually showed signs of fairly good granulation at this time. There does not appear to be any infection currently though I did have to remove some of the callus from the surface of the wound at this point. The patient tolerated that without complication there was some slough down to good subcutaneous tissue also removed. Post debridement everything appears to be doing significantly better which is great news. Electronic Signature(s) Signed: 01/13/2020 2:22:49 PM By: [name] PA-C Entered By: [name] on 01/13/2020 14:22:49 [name] ([id number]) -------------------------------------------------------------------------------- Physician Orders Details Patient Name: [name] Date of Service: 01/13/2020 12:30 PM Medical Record Number: [id number] Patient Account Number: [id number] Date of Birth/Sex: 10/01/1965 (55 y.o. 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 o Nurse Visit as needed Off-Loading Wound #4R Right,Lateral Foot o Open toe surgical shoe o Offloading felt to foot. Wound Treatment Wound #4R - Foot Wound  Laterality: Right, Lateral Cleanser: Byram Ancillary Kit - 15 Day Supply (Generic) 3 x Per Day/30 Days Discharge Instructions: Use supplies as instructed; Kit contains: (15) Saline Bullets; (15) 3x3 Gauze; 15 pr Gloves Cleanser: Normal Saline (Generic) 3 x Per Day/30 Days Discharge Instructions: Wash your hands with soap and water. Remove old dressing, discard into plastic bag and place into trash. Cleanse the wound with Normal Saline prior to applying a clean dressing using gauze sponges, not tissues or cotton balls. Do not scrub or use excessive force. Pat dry using gauze sponges, not tissue or cotton balls. Primary Dressing: Endoform 2x2 (in/in) (Generic) 3 x Per Day/30 Days Discharge Instructions: Apply Endoform as directed Secondary Dressing: Gauze 3 x Per Day/30 Days Discharge Instructions: As directed: dry, moistened with saline or  moistened with Dakins Solution Secured With: Conforming Stretch Gauze Bandage 4x75 (in/in) (Generic) 3 x Per Day/30 Days Discharge Instructions: Apply as directed Consults o Podiatry - Referred to [facility] for evaluation/discuss further options-offloading/surgical repair etc. Electronic Signature(s) Signed: 01/13/2020 4:32:22 PM By: [name] PA-C Signed: 01/13/2020 4:58:00 PM By: Georges Mouse, Minus Breeding RN Entered By: Georges Mouse, Minus Breeding on 01/13/2020 13:19:00 [name] ([id number]) -------------------------------------------------------------------------------- Problem List Details Patient Name: [name] Date of Service: 01/13/2020 12:30 PM Medical Record Number: [id number] Patient Account Number: [id number] Date of Birth/Sex: [birth year] (55 y.o. 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 M) Treating RN: Cornell Barman Primary Care Provider: Berneta Sages Other Clinician: Referring Provider: Berneta Sages Treating Provider/Extender: Skipper Cliche in Treatment: 63 Subjective Chief Complaint Information obtained from Patient Right lateral foot ulcer History of Present Illness (HPI) This 55 year old male comes with an ulcerated area on the plantar aspect of the right foot which she's had for approximately a month. I have known him from a previous visit at [facility] wound center and was treated in the months of April and May 2016 and rapidly healed a left plantar ulcer with a total contact cast. He has been a diabetic for about 16 years and tries to keep active and is fairly compliant with his diabetes management. 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 Past medical history significant for hypertension, hyperlipidemia, and status post appendectomy 1993. He does not smoke or drink alcohol. 01/14/2015 -- the patient had tolerated his total contact cast very well and had no problems and has had no systemic symptoms. However when his total contact cast was cut open he had excessive amount of purulent drainage in spite of being on antibiotics. He had had a recent x-ray done in the ER 12/22/2014 which showed IMPRESSION:No evidence of osseous erosion. Known soft tissue ulceration is not  well characterized on radiograph. Scattered vascular calcifications seen. his last hemoglobin A1c in December was 7.3. He has been on Augmentin and doxycycline for the last 2 weeks. 01/21/2015 -- his culture grew rare growth of Pantoea species an MR moderate growth of Candida parapsilosis. it is sensitive to levofloxacin. He has not heard back from the insurance company regarding his hyperbaric oxygen therapy. His MRI has not been done yet and we will try and get him an earlier date 01/28/2015 -- MRI was done last night -- IMPRESSION:1. Soft tissue ulcer overlying the plantar aspect of the  fifth metatarsal head extending to the cortex. Subcortical marrow edema in the fifth metatarsal head with corresponding T1 hypointensity is concerning for early osteomyelitis of the plantar lateral aspect of the fifth metatarsal head. Chest x-ray done on 01/14/2015 shows bronchiectatic changes without infiltrate. EKG done on generally 17 2017 shows a normal sinus rhythm and is a normal EKG. 02/04/2015 -- he was asked to see Dr. [name] last week and had 2 appointments but had to cancel both due to pressures of work. Last night he has woken up with severe pain in the foot and leg and it is swollen up. No fever or no change in his blood glucose. Addendum: I spoke with Dr. [name] who kindly agreed to accept the patient for inpatient therapy and have also opened to the hospitalist Dr. [name], and discuss details of the management including PICC line and repeat cultures. 02/12/2015-- -- was seen by Dr. [name] in the hospital and a PICC line was placed. He was to receive Ceftazidime 2 g every 12 hourly, oral levofloxacin 750 mg every 24 hourly and oral fluconazole 200 mg daily. The antibiotics were to be given for 4 weeks except the Diflucan was to be given for the first 2 weeks. Reviewed note from 02/10/2015 -- and Dr. [name] had recommended management for growth of MSSA and Serratia. He switched him from ceftazidime to ceftriaxone 2 g every 24 hours. Levofloxacin was stopped and he would continue on fluconazole for another week. He had asked me to decide whether further imaging was necessary and whether surgical debridement of the infected bone was needed. He is doing well and has been off work for this week and we will keep him off the next week. 02/22/2015 -- he was seen by my colleague on 01/19/2015 and at that time an incision and drainage was done on his right lateral forefoot on the dorsum. Today when I probed this wound it is frankly draining pus and it communicates with the ulcer on  the plantar aspect of his right foot. The patient is still on IV ceftriaxone 2 g every 24 hours and is to be seen by Dr. [name] on Friday. 03/19/2015 -- On 03/04/2015 I spoke to Dr. 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 He recommended operative debridement which would probably result in the fifth metatarsal head and toe amputation.The patient would be referred back to [place] once he was done with surgery. He was admitted to [facility] yesterday and had surgery done by podiatry for a right fifth metatarsal acute osteomyelitis with cellulitis and abscess. He had a right foot incision and drainage with fifth metatarsal partial amputation and removal of toe infected bone and soft tissue with cultures. The wound was partially closed and  packing of the distal end was done. Patient was already on cefepime 2 g IV every 8 hourly and put on vancomycin pending final cultures. He had grown moderate gram-negative rods, and later found to be rare diphtheroids. I received a call from Dr. Cannon Kettle the podiatrist and we discussed the above. On 03/10/2015 he was found positive for influenza a and has been put on Tamiflu. Since his discharge he has been seen by Dr. Cannon Kettle who is planning to remove his sutures this coming week. He was reviewed by Dr. [name] on 03/17/2015 and his Diflucan was stopped and vancomycin. After the 3 doses he is taking. He is going to change Ceftazidime to Zosyn 3.375 g IV every 8 hours. 03/25/2015 -- he was seen by the podiatrist a couple of days ago and the sutures were removed. She will follow back with him in 4 weeks' time and at that time x-rays will be taken and a custom molded insert would be made for his shoe. 04/01/2015 -- he was seen by Dr. [name] on 03/29/2015 who pulled the PICC line stopped his IV  antibiotics and recommended starting doxycycline and levofloxacin for 2 weeks. He also stop the fluconazole. The patient will follow up with him only when necessary. BARD, HAUPERT ([id number]) Readmission: 03/15/17 on evaluation today patient presents for initial evaluation concerning the new issues although he has previously been evaluated in our clinic. Unfortunately the previous evaluation led to the patient having to proceed to amputation and so he is somewhat nervous about being here today. With that being said he has a very slight blister that occurred on the plantar aspect more medial on the right great toe that has been present for just a very short amount of time, several days. With that being said he felt like initially when he called that he somewhat overreacted but due to the fact that his previous issue led to amputation he is very cautious these days I explained that he did the right thing. With that being said he has been tolerating the dressing changes without complication mainly he just been covering this. He does not have any discomfort in secondary to neuropathy it's unlikely that he feels much. With that being said he does tell me that the issue that he had here is that he went barefoot when he knows he should not have which subsequently led to the blisters. He states is definitely not doing that anymore. His most recent hemoglobin A1c was December and registered 6.9 his ABI today was 1.1. 03/27/17 on evaluation today patient appears to be doing great in regard to his right great toe ulcer. He has been tolerating the dressing changes without complication. The good news is this is making excellent progress and he seems to be caring for this in an excellent fashion. I see no evidence of breakdown that would make me concerned that he was at risk for infection/amputation. This has obviously been his concern due to the fifth toe amputation that was necessitated previous when he  had a similar issue. Nonetheless this seems to be progressing much more nicely. Readmission: 03/13/2019 upon evaluation today patient presents for reevaluation here in the clinic concerning issues he has been having with his right foot on the lateral portion at the proximal end of the metatarsal. Subsequently he tells me that this just opened up in the past couple of days or so and the erythema really began about 24 to 48 hours ago. Fortunately there is no signs of systemic  infection. 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 That left him with this bony prominence that is where the wound is currently. There is erythema surrounding that does have me concerned about cellulitis. With that being said he was noncompressible as far as ABIs are concerned I do think we can need to check into arterial studies at this point. Patient does have a history of hypertension along with diabetes mellitus type 2. He also tends to develop quite a bit of callus white often. 03/20/2019 upon evaluation today patient appears to be doing very well in regard to his wounds currently. He has been tolerating the dressing changes without complication. Fortunately there is no signs of infection. His ABIs were good at this point and registered at 1.07 on the left and 1.09 on the right. In regard to the x-ray this was negative as well for any signs of osteomyelitis. The patient also seems to be doing better in regard to the infection. He still has several days of the antibiotic left at this point best the Bactrim DS and he seems to be doing very well with this. Overall I am extremely pleased with the progress in 1 week's time he is going require some sharp debridement today however. 03/27/2019 upon evaluation today patient actually appears to be making some progress in regard to his wound. It is a little deeper but measuring smaller which is good news due to the fact that again were  clear away some of the necrotic tissue which is why the depth is increasing a little bit. Nonetheless after like were getting very close to being down at a good wound bed. Fortunately there is no signs of active infection at this time. There is a little bit of erythema immediately surrounding the wound that we do want to be very cognizant of and careful about. For that reason I am going to go ahead and extend his antibiotic today for an additional 10 days that is the Bactrim. 04/03/2019 upon evaluation today patient appears to be making some progress. I do feel like the Annitta Needs is doing a good job along with the alginate is being packed into the wound space behind. He has been tolerating the dressing changes without complication. Fortunately there is no signs of active infection at this time. No fevers, chills, nausea, vomiting, or diarrhea. 04/10/2019 upon evaluation today patient appears to be making progress. The wound is not quite as deep but he still does not have a great wound surface yet for working on this and the Santyl does seem to be cleaning things up. Fortunately there is no evidence of active infection at this time. No fevers, chills, nausea, vomiting, or diarrhea. 04/17/2019 upon evaluation today patient appears to be doing excellent at this time in regard to his foot ulcer. I do believe the Santyl with the alginate packed in behind has been of benefit for him and I am very pleased in this regard. With that being said I am feeling like the wound surface is improving quite a bit each time I see him still I do not believe we are at the point of stating that the wound bed is perfect but again were making progress. The patient is going require some sharp debridement today. 4/22; this is a patient who is using Santyl with calcium alginate. Apparently his wound dimensions have been improving. Patient is changing the dressing himself. He has a surgical shoe. He works in a sitting  position therefore  is not on his feet all that much. There is undermining 05/01/2019 upon evaluation today patient appears to be doing a little better in regard to the size of his wound though he still has some depth. This does seem to be much cleaner has been using Santyl on the base of the wound followed by packing with silver alginate and behind. Nonetheless I do believe that based on what we are seeing we may need to switch to a collagen type dressing endoform may actually be excellent for him. 05/08/2019 upon evaluation today patient's wound actually appears to be showing better granulation tissue in the base of the wound. With that being said he does have some epiboly noted around the edges of the wound especially plantar and more distal. Subsequently this is can require sharp debridement today. Fortunately there is no signs of active systemic infection though I do feel like there is some local cellulitis noted at this point. The patient also notes has had increased drainage. 5/13; wound volume quite a bit improved this week down to 3 mm. Patient states pain and drainage better. Culture from last week grew staph aureus [not MRSA] and this is sensitive to quinolones and he is on Levaquin. HOWEVER he also grew abundant Enterococcus faecalis treatment of choice for this is ampicillin. Quinolone coverage is unreliable 5/20 completing the Augmentin. Small wound on the right lateral foot. Thick rolled edges around the wound. Using silver alginate 05/29/2019 upon evaluation today patient appears to be doing better compared to last time I saw him. Fortunately there is no signs of active infection and I do feel like he is actually making good progress. Overall the quality of the wound bed along with the size looks improved and the inflammation/erythema that was previously noted by myself when I saw the couple weeks back actually has also resolved and this is great. Overall very pleased with how things stand. 06/06/2019 upon  evaluation today patient actually appears to be doing quite well with regard to his foot ulcer with regard to some of the granulation were seen at this point. There does not appear to be any evidence of systemic or local infection although he still has discomfort I feel like things are moving in a slow but surely correct direction. I think we may need to help fill some of the space however even though he is using the collagen we may need to have something behind this such as a plain packing strip to try to help hold the collagen in the base of the wound. 06/12/2019 upon evaluation today patient appears to be doing well with regard to his foot ulcer. This seems to be making some slow but steady progress. The wound does not appear to be as deep today compared to where it was previous. Fortunately there is no signs of active infection at TRAYLON, SCHIMMING. ([id number]) this time. No fevers, chills, nausea, vomiting, or diarrhea. 06/19/2019 on evaluation today patient's wound does not appear to be doing quite as well as what I would like to see. Fortunately there is no signs of systemic infection at this time which is great news. With that being said I do believe that the patient needs to have a wound culture as I do believe he likely has a local infection that needs to be addressed. He is also can require some debridement today as well. 06/26/2019 upon evaluation today patient's wound actually appears to be showing signs of excellent improvement today. There does not  appear to be any evidence of active infection which is great news. There does not appear to be significant erythema at this point. The patient does have improvement overall in the appearance of the infection he has been taking the Augmentin the culture came back showing no organism was grown at this point but nonetheless I do believe he has been doing well with the antibiotics I would continue that at this point. 07/03/2019 upon evaluation  today patient appears to be doing about the same in regard to his wound. There does not appear to be any evidence of active infection and overall I am pleased with where things stand. The patient is tolerating the dressing changes without complication but it does not appear that they are packing the wound at this time which I think is going to slow down his healing prospects. 07/10/2019 upon evaluation today patient appears to be doing little better in my opinion in regard to the overall moisture collected in the base of the wound. They have been packing this with a little bit of silver alginate and that has done quite well. Overall I am pleased in that regard. Subsequently the redness does not appear to be any worse today I did actually go ahead and marked this since this can be 2 weeks between now and when I see him again I would make sure that if anything changes or worsens he will be able to notice this and go ahead and initiate treatment with the Augmentin. 07/22/2019 patient comes in today a little bit early for evaluation secondary to the fact that he was having issues with increased drainage and concern about infection. With that being said he does appear to have developed a blister area again proximal to where the original wound opening was in although the wound bed itself actually appears to be doing somewhat better he has had increased drainage and tracking of fluid here due to this blister. Nonetheless I believe that that does need to be cleaned away today but again I am not really certain that I see a lot of evidence for infection at this point. 07/29/2019 on evaluation today patient actually appears to be doing better in regard to his foot ulcer. There is improvement compared to last week which is great news. Nonetheless he does come prepared to apply the total contact cast today which I think would be beneficial for him. He knows he is not supposed to drive he did bring his son with him  today in order to drive him. 07/31/2019 upon evaluation today patient appears to be doing well with regard to his wound today. I do not see any signs of infection I do believe that he is definitely showing some signs of improvement here in the office in a couple days as far as the overall erythema and pressure getting to the wound area is concerned. With that being said I do believe that the cast has been of great benefit for him. 08/08/2019 upon evaluation today patient appears to be doing better in regard to his wound. Overall I feel like he is showing signs of improvement. The external measurement is really not bad at all although I did remove some of the callus in order to open up the wound itself the depth is actually coming quite well and I am extremely pleased in that regard I do not think we can to see much improvement externally until we get the internal depth filled in and that again is improved. Fortunately there  is no signs of active infection at this time. No fevers, chills, nausea, vomiting, or diarrhea. 08/15/2019 on evaluation today patient actually appears to be doing well with regard to his foot ulcer. This is showing some signs of minimal improvement from the standpoint of the depth and appearance of the wound as well as the edges of the wound. It is taking its time and to be honest I do believe that he seems to be progressing well. I am happy with the cast and what it is achieving at this point. Have contemplated checking into Dermagraft for Apligraf for the patient but again he does seem to be making fairly good progress in my opinion. If it has not tremendously changed come next week I may consider one of the 2. 08/22/2019 on evaluation today patient appears to be making good progress with regard to his wound I am still very pleased with where things stand today. Overall I think that he is making good improvements week by week and I believe that we are getting continue with the Total  contact cast I will likely switch to a silver alginate dressing. 08/29/2019 on evaluation today patient appears to be doing quite well with regard to his wound. Fortunately this is measuring quite small. There is no signs of active infection at this time although he did have a little bit of a callus area just to the side of his foot where we previously noted some fluid collecting underneath that was the case today as well. I did have to remove this. With that being said overall he seems to be doing quite well. 09/05/19 on evaluation today patient appears to be doing very well with regard to his original wound which is actually very small on evaluation today. With that being said he does have a blistered area that's more proximal to where this wound is that has been a little bit more concerned. Fortunately there is no signs of active infection at this time. I'm going to need to remove the blister tissue today. 10 09/12/2019 on evaluation today patient actually appears to be doing much better in regard to his wound. I think he is actually improved more without the cast that he was prior with the cast. Overall very pleased with where things stand today. 09/22/2019 upon evaluation today patient appears to be doing very well at this time in regard to his foot ulcer. In fact I feel like he is continue to make good progress. I see no signs right now that there is any active infection which is great news. Also see very little evidence of pressure he does have some callus buildup but again this seems to be minimal. 09/29/2019 upon evaluation today patient appears to be doing well with regard to his foot ulcer. He has been tolerating the dressing changes without complication. Fortunately there is no signs of active infection at this time. No fevers, chills, nausea, vomiting, or diarrhea. 10/28/2019 upon evaluation today patient appears to be doing a little bit more poorly than last time I saw him as last time we  assumed he was completely healed. Apparently that was not the case or else this is reopened but I tend to believe that this probably has never completely closed internally though it appeared to be. He has been using the offloading felt which seems to have done well there is no signs of redness no evidence of erythema all of which is good news. Nonetheless I am concerned about the fact that he  is having issues here with what appears to be a reopening of the wound. 11/04/2019 upon evaluation today patient's wound actually showed signs of fairly good improvement today which is great news the wound bed does not appear to be significantly callus covered and also does not appear to show any signs of significant infection or really any infection whatsoever. I am very pleased in this regard. In general I think that he is making good progress although this is a lot slower than he would like to see I think we are headed back in the correct direction. He is continuing to use the offloading felt. BUD, KAESER ([id number]) 11/18/2019 on evaluation today patient appears to be having some callus buildup and cracking that occurred at the wound site. Subsequently actually did have to perform a fairly significant debridement on the area to clean away some of the callus buildup he tolerated that today with some bleeding with that being said I do feel like the wound appear to be doing much better post debridement. 11/25/2019 upon evaluation today patient appears to be doing somewhat poorly in regard to his foot ulcer. Unfortunately he is having some issues with cellulitis along the lateral aspect of his foot. Unfortunately he seems to be having some issues today with infection/cellulitis in regard to the foot. It still towards the proximal/dorsal surface of where the wound is and this subsequently is the area that always seems to cause him trouble. 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 I do believe we may  need to look into this further to ensure that there is nothing more significant going on considering that he seems to have an ongoing issue with this at this point. 12/02/2019 upon evaluation today patient appears to be doing better in regard to his foot ulcer. This is great news. Fortunately there is no signs of active infection systemically which is also excellent. He has been tolerating the dressing changes without complication. With that being said he does tell me that the Augmentin has been causing him some trouble as far as headaches are concerned since he is been taking it. Is unsure if it is directly related or not. Nonetheless as I explained to him it is possible that this could be something that he is experiencing as a result of the antibiotic. The good news is there are other options that we could switch him to that would work equally well while at the same time seeing if indeed the antibiotic was causing the headaches. He does want to proceed with that. 12/09/2019 on evaluation today patient's wound actually showing some signs of improvement. Fortunately there is no signs of active infection at this time great news. No fever chills noted. He has been using the alginate which I think has done well for him up to this point. No fevers, chills, nausea, vomiting, or diarrhea. 12/18/2019 on evaluation today patient's wound is actually showing signs of fairly good granulation at this point and epithelization. He does have some callus still covering over the little tunnel off to the side that still seems to be staying open despite the fact that we have been trying to keep the callus away from this is much as possible. Fortunately there is no signs of active infection systemically at this time overall I feel like the patient is making good progress in that regard. There is no erythema whatsoever noted. With that being said were still trying to check into MRI in where we stand in that regard.  Apparently has been approved but not scheduled. 12/26/2019 upon evaluation today patient appears to be doing decently well in regard to his wound. He does have some callus buildup beginning to clear this away. Fortunately there is no signs of active infection at this time. No fevers, chills, nausea, vomiting, or diarrhea. 01/05/2020 upon evaluation today patient appears to be doing much better in regard to his foot ulcer. 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 It also did show that he had no evidence of osteomyelitis of the forefoot which was excellent. 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 Overall the fact that he does not have a bone infection is the ideal thing here. 11/13/2019 upon evaluation today patient appears to be doing about the same in regards to his foot ulcer. Unfortunately he has not shown signs of significant improvement at this time which is unfortunate. I feel like that the area where the remaining metatarsal bone is where he had the fifth ray amputation is actually a significant pressure point this causing some issues here for him. I may want him to have him go back to the podiatrist to see if there is anything that he could have done to try to clear this up a bit. I do not know if he needs any additional surgery to possibly trim down the bone area or if potentially a better offloading shoe may be the better way to go. I do want to see about their expertise in this regard. Objective Constitutional Well-nourished and well-hydrated in no acute distress. Vitals Time Taken: 12:38 PM, Height: 74 in, Weight: 308 lbs, BMI: 39.5, Temperature: 98.5 F, Pulse: 80 bpm, Respiratory Rate: 18 breaths/min, Blood Pressure: 138/74 mmHg. Respiratory normal breathing without difficulty. Psychiatric this  patient is able to make decisions and demonstrates good insight into disease process. Alert and Oriented x 3. pleasant and cooperative. General Notes: Upon inspection patient's wound bed actually showed signs of fairly good granulation at this time. There does not appear to be any infection currently though I did have to remove some of the callus from the surface of the wound at this point. The patient tolerated that without complication there was some slough down to good subcutaneous tissue also removed. Post debridement everything appears to be doing significantly better which is great news. Integumentary (Hair, Skin) Wound #4R status is Open. Original cause of wound was Gradually Appeared. The wound is located on the Right,Lateral Foot. The wound measures 0.4cm length x 0.5cm width x 0.3cm depth; 0.157cm^2 area and 0.047cm^3 volume. There is Fat Layer (Subcutaneous Tissue) exposed. There is no tunneling or undermining noted. There is a small amount of serosanguineous drainage noted. The wound margin is thickened. There is large (67-100%) red granulation within the wound bed. There is a small (1-33%) amount of necrotic tissue within the wound JANSON, LAMAR. ([id number]) bed including Adherent Slough. Assessment Active Problems ICD-10 Type 2 diabetes mellitus with foot ulcer 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d Cellulitis of right lower limb Corns and callosities Essential (primary) hypertension Acquired absence of other left toe(s) Procedures Wound #4R Pre-procedure diagnosis of Wound #4R is a Diabetic Wound/Ulcer of the Lower Extremity located on the Right,Lateral Foot .Severity of Tissue Pre Debridement is: Fat layer exposed. There was a  Excisional Skin/Subcutaneous Tissue Debridement with a total area of 0.2 sq cm performed by Tommie Sams., PA-C. With the following instrument(s): Curette to remove Viable and Non-Viable tissue/material.  Material removed includes Callus and Subcutaneous Tissue and after achieving pain control using Lidocaine 4% Topical Solution. A time out was conducted at 12:57, prior to the start of the procedure. A Minimum amount of bleeding was controlled with Pressure. The procedure was tolerated well with a pain level of 0 throughout and a pain level of 0 following the procedure. Post Debridement Measurements: 0.5cm length x 0.6cm width x 0.3cm depth; 0.071cm^3 volume. Character of Wound/Ulcer Post Debridement is stable. Severity of Tissue Post Debridement is: Fat layer exposed. Post procedure Diagnosis Wound #4R: Same as Pre-Procedure Plan Follow-up Appointments: Return Appointment in 1 week. Nurse Visit as needed Off-Loading: Wound #4R Right,Lateral Foot: Open toe surgical shoe Offloading felt to foot. Consults ordered were: Podiatry - Referred to [facility] for evaluation/discuss further options-offloading/surgical repair etc. WOUND #4R: - Foot Wound Laterality: Right, Lateral Cleanser: Byram Ancillary Kit - 15 Day Supply (Generic) 3 x Per Day/30 Days Discharge Instructions: Use supplies as instructed; Kit contains: (15) Saline Bullets; (15) 3x3 Gauze; 15 pr Gloves Cleanser: Normal Saline (Generic) 3 x Per Day/30 Days Discharge Instructions: Wash your hands with soap and water. Remove old dressing, discard into plastic bag and place into trash. Cleanse the wound with Normal Saline prior to applying a clean dressing using gauze sponges, not tissues or cotton balls. Do not scrub or use excessive force. Pat dry using gauze sponges, not tissue or cotton balls. Primary Dressing: Endoform 2x2 (in/in) (Generic) 3 x Per Day/30 Days Discharge Instructions: Apply Endoform as directed Secondary Dressing: Gauze 3 x Per Day/30 Days Discharge Instructions: As directed: dry, moistened with saline or moistened with Dakins Solution Secured With: Conforming Stretch Gauze Bandage 4x75 (in/in) (Generic) 3 x  Per Day/30 Days Discharge Instructions: Apply as directed 1. Him and recommended this point that we actually have the patient go ahead and initiate treatment with a continuation of the wound care measures as before with regard to the endoform I think that still a good way to go. 2. Also, recommend that he continue with offloading shoe for the time being. 3. We did discuss the possibility of having him put back in a total contact cast. With that being said I do want to see about a referral to [facility] to evaluate surgical options for his foot as well as the possibility for more appropriate offloading and see what their suggestions are. For that reason I am not can place him in the cast yet. With that being said depending on their response and recommendations the cast may be something to consider. WLADYSLAW, HENRICHS ([id number]) We will see patient back for reevaluation in 1 week here in the clinic. If anything worsens or changes patient will contact our office for additional recommendations. I did fill out FMLA paperwork for the patient in regard to his frequent need to miss work due to office visits. That was given to him today and a copy was kept to be scanned into the chart. Electronic Signature(s) Signed: 01/13/2020 2:24:02 PM By: [name] PA-C Entered By: [name] on 01/13/2020 14:24:01 [name] ([id number]) -------------------------------------------------------------------------------- SuperBill Details Patient Name: [name] Date of Service: 01/13/2020 Medical Record Number: [id number] Patient Account Number: [id number] Date of Birth/Sex: 1965-09-21 (55 y.o. M) Treating RN: Cornell Barman Primary Care Provider: Berneta Sages Other  Clinician: Referring Provider: Berneta Sages Treating Provider/Extender: Skipper Cliche in Treatment: 43 Diagnosis Coding ICD-10 Codes Code Description E11.621 Type 2 diabetes mellitus with foot ulcer L97.512  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d L03.115 Cellulitis of right lower limb L84 Corns and callosities I10 Essential (primary) hypertension Z89.422 Acquired absence of other left toe(s) Facility Procedures CPT4 Code: 10272536 Description: 11042 - DEB SUBQ TISSUE 20 SQ CM/< Modifier: Quantity: 1 CPT4 Code: Description: ICD-10 Diagnosis Description L97.512 Non-pressure chronic ulcer of other part of right foot with fat layer ex Modifier: posed Quantity: Physician Procedures CPT4 Code: 6440347 Description: 42595 - WC PHYS LEVEL 4 - EST PT Modifier: 25 Quantity: 1 CPT4 Code: Description: ICD-10 Diagnosis Description E11.621 Type 2 diabetes mellitus with foot ulcer L97.512 Non-pressure chronic ulcer of other part of right foot with fat layer ex L03.115 Cellulitis of right lower limb L84 Corns and callosities Modifier: posed Quantity: CPT4 Code: 6387564 Description: 11042 - WC PHYS SUBQ TISS 20 SQ CM Modifier: Quantity: 1 CPT4 Code: Description: ICD-10 Diagnosis Description L97.512 Non-pressure chronic ulcer of other part of right foot with fat layer ex Modifier: posed Quantity: Electronic Signature(s) Signed: 01/13/2020 2:30:18 PM By: [name] PA-C Entered By: [name] on 01/13/2020 14:30:18

## 2020-01-14 ENCOUNTER — Encounter (INDEPENDENT_AMBULATORY_CARE_PROVIDER_SITE_OTHER): Payer: Managed Care, Other (non HMO) | Admitting: Ophthalmology

## 2020-01-16 ENCOUNTER — Ambulatory Visit: Payer: Managed Care, Other (non HMO) | Admitting: Podiatry

## 2020-01-16 ENCOUNTER — Other Ambulatory Visit: Payer: Self-pay

## 2020-01-16 ENCOUNTER — Encounter: Payer: Self-pay | Admitting: Podiatry

## 2020-01-16 DIAGNOSIS — E0843 Diabetes mellitus due to underlying condition with diabetic autonomic (poly)neuropathy: Secondary | ICD-10-CM | POA: Diagnosis not present

## 2020-01-16 DIAGNOSIS — L97512 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d: Secondary | ICD-10-CM | POA: Diagnosis not present

## 2020-01-16 DIAGNOSIS — M898X7 Other specified disorders of bone, ankle and foot: Secondary | ICD-10-CM | POA: Diagnosis not present

## 2020-01-16 NOTE — Patient Instructions (Signed)
Pre-Operative Instructions  Congratulations, you have decided to take an important step towards improving your quality of life.  You can be assured that the doctors and staff at [facility] will be with you every step of the way.  Here are some important things you should know:  1. Plan to be at the surgery center/hospital at least 1 (one) hour prior to your scheduled time, unless otherwise directed by the surgical center/hospital staff.  You must have a responsible adult accompany you, remain during the surgery and drive you home.  Make sure you have directions to the surgical center/hospital to ensure you arrive on time. 2. If you are having surgery at [facility] or [facility], you will need a copy of your medical history and physical form from your family physician within one month prior to the date of surgery. We will give you a form for your primary physician to complete.  3. We make every effort to accommodate the date you request for surgery.  However, there are times where surgery dates or times have to be moved.  We will contact you as soon as possible if a change in schedule is required.   4. No aspirin/ibuprofen for one week before surgery.  If you are on aspirin, any non-steroidal anti-inflammatory medications (Mobic, Aleve, Ibuprofen) should not be taken seven (7) days prior to your surgery.  You make take Tylenol for pain prior to surgery.  5. Medications - If you are taking daily heart and blood pressure medications, seizure, reflux, allergy, asthma, anxiety, pain or diabetes medications, make sure you notify the surgery center/hospital before the day of surgery so they can tell you which medications you should take or avoid the day of surgery. 6. No food or drink after midnight the night before surgery unless directed otherwise by surgical center/hospital staff. 7. No alcoholic beverages 24-hours prior to surgery.  No smoking 24-hours prior or 24-hours after  surgery. 8. Wear loose pants or shorts. They should be loose enough to fit over bandages, boots, and casts. 9. Don't wear slip-on shoes. Sneakers are preferred. 10. Bring your boot with you to the surgery center/hospital.  Also bring crutches or a walker if your physician has prescribed it for you.  If you do not have this equipment, it will be provided for you after surgery. 11. If you have not been contacted by the surgery center/hospital by the day before your surgery, call to confirm the date and time of your surgery. 12. Leave-time from work may vary depending on the type of surgery you have.  Appropriate arrangements should be made prior to surgery with your employer. 13. Prescriptions will be provided immediately following surgery by your doctor.  Fill these as soon as possible after surgery and take the medication as directed. Pain medications will not be refilled on weekends and must be approved by the doctor. 14. Remove nail polish on the operative foot and avoid getting pedicures prior to surgery. 15. Wash the night before surgery.  The night before surgery wash the foot and leg well with water and the antibacterial soap provided. Be sure to pay special attention to beneath the toenails and in between the toes.  Wash for at least three (3) minutes. Rinse thoroughly with water and dry well with a towel.  Perform this wash unless told not to do so by your physician.  Enclosed: 1 Ice pack (please put in freezer the night before surgery)   1 Hibiclens skin cleaner     Pre-op instructions  If you have any questions regarding the instructions, please do not hesitate to call our office.  [place]: [address], [place], [place] [address] -- [phone]  [place]: [address] [place], [place] [address] -- [phone]  [place]: [address], [place], [place] [address] -- [phone]   Website: [email or web address] 

## 2020-01-20 ENCOUNTER — Encounter: Payer: Managed Care, Other (non HMO) | Admitting: Physician Assistant

## 2020-01-20 ENCOUNTER — Other Ambulatory Visit: Payer: Self-pay

## 2020-01-27 ENCOUNTER — Other Ambulatory Visit: Payer: Self-pay

## 2020-01-27 ENCOUNTER — Encounter: Payer: Managed Care, Other (non HMO) | Admitting: Physician Assistant

## 2020-01-27 DIAGNOSIS — E11621 Type 2 diabetes mellitus with foot ulcer: Secondary | ICD-10-CM | POA: Diagnosis not present

## 2020-01-27 NOTE — Progress Notes (Addendum)
[name], [name] ([id number]) Visit Report for 01/27/2020 Chief Complaint Document Details Patient Name: [name], [name]. Date of Service: 01/27/2020 8:15 AM Medical Record Number: [id number] Patient Account Number: [id number] Date of Birth/Sex: 1965/09/14 (55 y.o. M) Treating RN: [name] Primary Care Provider: Berneta [name] Other Clinician: Referring Provider: Berneta [name] Treating Provider/Extender: [name] in Treatment: 5 Information Obtained from: Patient Chief Complaint Right lateral foot ulcer Electronic Signature(s) Signed: 01/27/2020 8:21:27 AM By: [name] Keeler [name] Entered By: [name] on 01/27/2020 08:21:27 [name] ([id number]) -------------------------------------------------------------------------------- Debridement Details Patient Name: [name] Date of Service: 01/27/2020 8:15 AM Medical Record Number: [id number] Patient Account Number: [id number] Date of Birth/Sex: 1965/08/31 (55 y.o. M) Treating RN: [name] Primary Care Provider: Berneta [name] Other Clinician: Referring Provider: Berneta [name] Treating Provider/Extender: [name] in Treatment: 66 Debridement Performed for Wound #4R Right,Lateral Foot Assessment: Performed By: Physician [name] Debridement Type: Debridement Severity of Tissue Pre Debridement: Fat layer exposed Level of Consciousness (Pre- Awake and Alert procedure): Pre-procedure Verification/Time Out Yes - 08:25 Taken: Start Time: 08:25 Pain Control: Lidocaine 4% Topical Solution Total Area Debrided (L x W): 0.4 (cm) x 0.5 (cm) = 0.2 (cm) Tissue and other material Viable, Non-Viable, Callus, Slough, Subcutaneous, Skin: Dermis , Skin: Epidermis, Slough debrided: Level: Skin/Subcutaneous Tissue Debridement Description: Excisional Instrument: Curette Bleeding: Minimum Hemostasis Achieved: Pressure End Time: 08:28 Procedural Pain: 0 Post Procedural Pain: 0 Response  to Treatment: Procedure was tolerated well Level of Consciousness (Post- Awake and Alert procedure): Post Debridement Measurements of Total Wound Length: (cm) 0.4 Width: (cm) 0.5 Depth: (cm) 0.1 Volume: (cm) 0.016 Character of Wound/Ulcer Post Debridement: Improved Severity of Tissue Post Debridement: Fat layer exposed Post Procedure Diagnosis Same as Pre-procedure Electronic Signature(s) Signed: 01/27/2020 3:31:15 PM By: [name] Keeler [name] Signed: 01/29/2020 11:33:31 AM By: [name] Coria RN Entered By: [name] on 01/27/2020 08:28:33 [name] ([id number]) -------------------------------------------------------------------------------- HPI Details Patient Name: [name] Date of Service: 01/27/2020 8:15 AM Medical Record Number: [id number] Patient Account Number: [id number] Date of Birth/Sex: 1965-01-11 (55 y.o. M) Treating RN: [name] Primary Care Provider: Berneta [name] Other Clinician: Referring Provider: Berneta [name] Treating Provider/Extender: [name] in Treatment: 9 History of Present Illness HPI Description: This 55 year old male comes with an ulcerated area on the plantar aspect of the right foot which she's had for approximately a month. I have known him from a previous visit at [facility] wound center and was treated in the months of April and May 2016 and rapidly healed a left plantar ulcer with a total contact cast. He has been a diabetic for about 16 years and tries to keep active and is fairly compliant with his diabetes management. 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 Past medical history significant for hypertension, hyperlipidemia, and status post appendectomy 1993. He does not smoke or drink alcohol. 01/14/2015 -- the patient had tolerated his total contact cast very well and had no problems and has had no systemic symptoms. However when his total contact cast was cut open he had excessive amount of purulent drainage in  spite of being on antibiotics. He had had a recent x-ray done in the ER 12/22/2014 which showed IMPRESSION:No evidence of osseous erosion. Known soft tissue ulceration is not well characterized on radiograph. Scattered vascular calcifications seen. his last hemoglobin A1c in December was 7.3. He has been on Augmentin and doxycycline for the last 2 weeks. 01/21/2015 -- his culture grew rare  growth of Pantoea species an MR moderate growth of Candida parapsilosis. it is sensitive to levofloxacin. He has not heard back from the insurance company regarding his hyperbaric oxygen therapy. His MRI has not been done yet and we will try and get him an earlier date 01/28/2015 -- MRI was done last night -- IMPRESSION:1. Soft tissue ulcer overlying the plantar aspect of the fifth metatarsal head extending to the cortex. Subcortical marrow edema in the fifth metatarsal head with corresponding T1 hypointensity is concerning for early osteomyelitis of the plantar lateral aspect of the fifth metatarsal head. Chest x-ray done on 01/14/2015 shows bronchiectatic changes without infiltrate. EKG done on generally 17 2017 shows a normal sinus rhythm and is a normal EKG. 02/04/2015 -- he was asked to see [name] last week and had 2 appointments but had to cancel both due to pressures of work. Last night he has woken up with severe pain in the foot and leg and it is swollen up. No fever or no change in his blood glucose. Addendum: I spoke with [name] who kindly agreed to accept the patient for inpatient therapy and have also opened to the hospitalist [name], and discuss details of the management including PICC line and repeat cultures. 02/12/2015-- -- was seen by [name] in the hospital and a PICC line was placed. He was to receive Ceftazidime 2 g every 12 hourly, oral levofloxacin 750 mg every 24 hourly and oral fluconazole 200 mg daily. The antibiotics were to be given for 4 weeks except the  Diflucan was to be given for the first 2 weeks. Reviewed note from 02/10/2015 -- and [name] had recommended management for growth of MSSA and Serratia. He switched him from ceftazidime to ceftriaxone 2 g every 24 hours. Levofloxacin was stopped and he would continue on fluconazole for another week. He had asked me to decide whether further imaging was necessary and whether surgical debridement of the infected bone was needed. He is doing well and has been off work for this week and we will keep him off the next week. 02/22/2015 -- he was seen by my colleague on 01/19/2015 and at that time an incision and drainage was done on his right lateral forefoot on the dorsum. Today when I probed this wound it is frankly draining pus and it communicates with the ulcer on the plantar aspect of his right foot. The patient is still on IV ceftriaxone 2 g every 24 hours and is to be seen by [name] on Friday. 03/19/2015 -- On 03/04/2015 I spoke to Dr. 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 He recommended operative debridement which would probably result in the fifth metatarsal head and toe amputation.The patient would be referred back to [place] once he was done with surgery. He was admitted to [facility] yesterday and had surgery done by podiatry for a right fifth metatarsal acute osteomyelitis with cellulitis and abscess. He had a right foot incision and drainage with fifth metatarsal partial amputation and removal of toe infected bone and soft tissue with cultures. The wound was partially closed and packing of the distal end was done. Patient was already on cefepime 2 g IV every 8 hourly and put on vancomycin pending final cultures. He had grown moderate gram-negative rods, and later found to  be rare diphtheroids. I received a call  from Dr. Cannon Kettle the podiatrist and we discussed the above. On 03/10/2015 he was found positive for influenza a and has been put on Tamiflu. Since his discharge he has been seen by Dr. Cannon Kettle who is planning to remove his sutures this coming week. He was reviewed by [name] on 03/17/2015 and his Diflucan was stopped and vancomycin. After the 3 doses he is taking. He is going to change Ceftazidime to Zosyn 3.375 g IV every 8 hours. 03/25/2015 -- he was seen by the podiatrist a couple of days ago and the sutures were removed. She will follow back with him in 4 weeks' time and at that time x-rays will be taken and a custom molded insert would be made for his shoe. 04/01/2015 -- he was seen by [name] on 03/29/2015 who pulled the PICC line stopped his IV antibiotics and recommended starting doxycycline and levofloxacin for 2 weeks. He also stop the fluconazole. The patient will follow up with him only when necessary. Readmission: 03/15/17 on evaluation today patient presents for initial evaluation concerning the new issues although he has previously been evaluated in our clinic. Unfortunately the previous evaluation led to the patient having to proceed to amputation and so he is somewhat nervous about being here [name], [name] ([id number]) today. With that being said he has a very slight blister that occurred on the plantar aspect more medial on the right great toe that has been present for just a very short amount of time, several days. With that being said he felt like initially when he called that he somewhat overreacted but due to the fact that his previous issue led to amputation he is very cautious these days I explained that he did the right thing. With that being said he has been tolerating the dressing changes without complication mainly he just been covering this. He does not have any discomfort in secondary to neuropathy it's unlikely that he feels much. With that being said  he does tell me that the issue that he had here is that he went barefoot when he knows he should not have which subsequently led to the blisters. He states is definitely not doing that anymore. His most recent hemoglobin A1c was December and registered 6.9 his ABI today was 1.1. 03/27/17 on evaluation today patient appears to be doing great in regard to his right great toe ulcer. He has been tolerating the dressing changes without complication. The good news is this is making excellent progress and he seems to be caring for this in an excellent fashion. I see no evidence of breakdown that would make me concerned that he was at risk for infection/amputation. This has obviously been his concern due to the fifth toe amputation that was necessitated previous when he had a similar issue. Nonetheless this seems to be progressing much more nicely. Readmission: 03/13/2019 upon evaluation today patient presents for reevaluation here in the clinic concerning issues he has been having with his right foot on the lateral portion at the proximal end of the metatarsal. Subsequently he tells me that this just opened up in the past couple of days or so and the erythema really began about 24 to 48 hours ago. Fortunately there is no signs of systemic infection. 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 That left him with this bony prominence that is where the wound is currently. There is erythema surrounding  that does have me concerned about cellulitis. With that being said he was noncompressible as far as ABIs are concerned I do think we can need to check into arterial studies at this point. Patient does have a history of hypertension along with diabetes mellitus type 2. He also tends to develop quite a bit of callus white often. 03/20/2019 upon evaluation today patient appears to be doing very well in regard to his wounds currently. He has been tolerating the  dressing changes without complication. Fortunately there is no signs of infection. His ABIs were good at this point and registered at 1.07 on the left and 1.09 on the right. In regard to the x-ray this was negative as well for any signs of osteomyelitis. The patient also seems to be doing better in regard to the infection. He still has several days of the antibiotic left at this point best the Bactrim DS and he seems to be doing very well with this. Overall I am extremely pleased with the progress in 1 week's time he is going require some sharp debridement today however. 03/27/2019 upon evaluation today patient actually appears to be making some progress in regard to his wound. It is a little deeper but measuring smaller which is good news due to the fact that again were clear away some of the necrotic tissue which is why the depth is increasing a little bit. Nonetheless after like were getting very close to being down at a good wound bed. Fortunately there is no signs of active infection at this time. There is a little bit of erythema immediately surrounding the wound that we do want to be very cognizant of and careful about. For that reason I am going to go ahead and extend his antibiotic today for an additional 10 days that is the Bactrim. 04/03/2019 upon evaluation today patient appears to be making some progress. I do feel like the Annitta Needs is doing a good job along with the alginate is being packed into the wound space behind. He has been tolerating the dressing changes without complication. Fortunately there is no signs of active infection at this time. No fevers, chills, nausea, vomiting, or diarrhea. 04/10/2019 upon evaluation today patient appears to be making progress. The wound is not quite as deep but he still does not have a great wound surface yet for working on this and the Santyl does seem to be cleaning things up. Fortunately there is no evidence of active infection at this time. No fevers,  chills, nausea, vomiting, or diarrhea. 04/17/2019 upon evaluation today patient appears to be doing excellent at this time in regard to his foot ulcer. I do believe the Santyl with the alginate packed in behind has been of benefit for him and I am very pleased in this regard. With that being said I am feeling like the wound surface is improving quite a bit each time I see him still I do not believe we are at the point of stating that the wound bed is perfect but again were making progress. The patient is going require some sharp debridement today. 4/22; this is a patient who is using Santyl with calcium alginate. Apparently his wound dimensions have been improving. Patient is changing the dressing himself. He has a surgical shoe. He works in a sitting position therefore is not on his feet all that much. There is undermining 05/01/2019 upon evaluation today patient appears to be doing a little better in regard to the size of his wound though  he still has some depth. This does seem to be much cleaner has been using Santyl on the base of the wound followed by packing with silver alginate and behind. Nonetheless I do believe that based on what we are seeing we may need to switch to a collagen type dressing endoform may actually be excellent for him. 05/08/2019 upon evaluation today patient's wound actually appears to be showing better granulation tissue in the base of the wound. With that being said he does have some epiboly noted around the edges of the wound especially plantar and more distal. Subsequently this is can require sharp debridement today. Fortunately there is no signs of active systemic infection though I do feel like there is some local cellulitis noted at this point. The patient also notes has had increased drainage. 5/13; wound volume quite a bit improved this week down to 3 mm. Patient states pain and drainage better. Culture from last week grew staph aureus [not MRSA] and this is sensitive  to quinolones and he is on Levaquin. HOWEVER he also grew abundant Enterococcus faecalis treatment of choice for this is ampicillin. Quinolone coverage is unreliable 5/20 completing the Augmentin. Small wound on the right lateral foot. Thick rolled edges around the wound. Using silver alginate 05/29/2019 upon evaluation today patient appears to be doing better compared to last time I saw him. Fortunately there is no signs of active infection and I do feel like he is actually making good progress. Overall the quality of the wound bed along with the size looks improved and the inflammation/erythema that was previously noted by myself when I saw the couple weeks back actually has also resolved and this is great. Overall very pleased with how things stand. 06/06/2019 upon evaluation today patient actually appears to be doing quite well with regard to his foot ulcer with regard to some of the granulation were seen at this point. There does not appear to be any evidence of systemic or local infection although he still has discomfort I feel like things are moving in a slow but surely correct direction. I think we may need to help fill some of the space however even though he is using the collagen we may need to have something behind this such as a plain packing strip to try to help hold the collagen in the base of the wound. 06/12/2019 upon evaluation today patient appears to be doing well with regard to his foot ulcer. This seems to be making some slow but steady progress. The wound does not appear to be as deep today compared to where it was previous. Fortunately there is no signs of active infection at this time. No fevers, chills, nausea, vomiting, or diarrhea. 06/19/2019 on evaluation today patient's wound does not appear to be doing quite as well as what I would like to see. Fortunately there is no signs of systemic infection at this time which is great news. With that being said I do believe that the  patient needs to have a wound culture as I do ZYIRE, EIDSON. ([id number]) believe he likely has a local infection that needs to be addressed. He is also can require some debridement today as well. 06/26/2019 upon evaluation today patient's wound actually appears to be showing signs of excellent improvement today. There does not appear to be any evidence of active infection which is great news. There does not appear to be significant erythema at this point. The patient does have improvement overall in the appearance of the  infection he has been taking the Augmentin the culture came back showing no organism was grown at this point but nonetheless I do believe he has been doing well with the antibiotics I would continue that at this point. 07/03/2019 upon evaluation today patient appears to be doing about the same in regard to his wound. There does not appear to be any evidence of active infection and overall I am pleased with where things stand. The patient is tolerating the dressing changes without complication but it does not appear that they are packing the wound at this time which I think is going to slow down his healing prospects. 07/10/2019 upon evaluation today patient appears to be doing little better in my opinion in regard to the overall moisture collected in the base of the wound. They have been packing this with a little bit of silver alginate and that has done quite well. Overall I am pleased in that regard. Subsequently the redness does not appear to be any worse today I did actually go ahead and marked this since this can be 2 weeks between now and when I see him again I would make sure that if anything changes or worsens he will be able to notice this and go ahead and initiate treatment with the Augmentin. 07/22/2019 patient comes in today a little bit early for evaluation secondary to the fact that he was having issues with increased drainage and concern about infection. With that  being said he does appear to have developed a blister area again proximal to where the original wound opening was in although the wound bed itself actually appears to be doing somewhat better he has had increased drainage and tracking of fluid here due to this blister. Nonetheless I believe that that does need to be cleaned away today but again I am not really certain that I see a lot of evidence for infection at this point. 07/29/2019 on evaluation today patient actually appears to be doing better in regard to his foot ulcer. There is improvement compared to last week which is great news. Nonetheless he does come prepared to apply the total contact cast today which I think would be beneficial for him. He knows he is not supposed to drive he did bring his son with him today in order to drive him. 07/31/2019 upon evaluation today patient appears to be doing well with regard to his wound today. I do not see any signs of infection I do believe that he is definitely showing some signs of improvement here in the office in a couple days as far as the overall erythema and pressure getting to the wound area is concerned. With that being said I do believe that the cast has been of great benefit for him. 08/08/2019 upon evaluation today patient appears to be doing better in regard to his wound. Overall I feel like he is showing signs of improvement. The external measurement is really not bad at all although I did remove some of the callus in order to open up the wound itself the depth is actually coming quite well and I am extremely pleased in that regard I do not think we can to see much improvement externally until we get the internal depth filled in and that again is improved. Fortunately there is no signs of active infection at this time. No fevers, chills, nausea, vomiting, or diarrhea. 08/15/2019 on evaluation today patient actually appears to be doing well with regard to his foot ulcer. This is  showing some  signs of minimal improvement from the standpoint of the depth and appearance of the wound as well as the edges of the wound. It is taking its time and to be honest I do believe that he seems to be progressing well. I am happy with the cast and what it is achieving at this point. Have contemplated checking into Dermagraft for Apligraf for the patient but again he does seem to be making fairly good progress in my opinion. If it has not tremendously changed come next week I may consider one of the 2. 08/22/2019 on evaluation today patient appears to be making good progress with regard to his wound I am still very pleased with where things stand today. Overall I think that he is making good improvements week by week and I believe that we are getting continue with the Total contact cast I will likely switch to a silver alginate dressing. 08/29/2019 on evaluation today patient appears to be doing quite well with regard to his wound. Fortunately this is measuring quite small. There is no signs of active infection at this time although he did have a little bit of a callus area just to the side of his foot where we previously noted some fluid collecting underneath that was the case today as well. I did have to remove this. With that being said overall he seems to be doing quite well. 09/05/19 on evaluation today patient appears to be doing very well with regard to his original wound which is actually very small on evaluation today. With that being said he does have a blistered area that's more proximal to where this wound is that has been a little bit more concerned. Fortunately there is no signs of active infection at this time. I'm going to need to remove the blister tissue today. 10 09/12/2019 on evaluation today patient actually appears to be doing much better in regard to his wound. I think he is actually improved more without the cast that he was prior with the cast. Overall very pleased with where things  stand today. 09/22/2019 upon evaluation today patient appears to be doing very well at this time in regard to his foot ulcer. In fact I feel like he is continue to make good progress. I see no signs right now that there is any active infection which is great news. Also see very little evidence of pressure he does have some callus buildup but again this seems to be minimal. 09/29/2019 upon evaluation today patient appears to be doing well with regard to his foot ulcer. He has been tolerating the dressing changes without complication. Fortunately there is no signs of active infection at this time. No fevers, chills, nausea, vomiting, or diarrhea. 10/28/2019 upon evaluation today patient appears to be doing a little bit more poorly than last time I saw him as last time we assumed he was completely healed. Apparently that was not the case or else this is reopened but I tend to believe that this probably has never completely closed internally though it appeared to be. He has been using the offloading felt which seems to have done well there is no signs of redness no evidence of erythema all of which is good news. Nonetheless I am concerned about the fact that he is having issues here with what appears to be a reopening of the wound. 11/04/2019 upon evaluation today patient's wound actually showed signs of fairly good improvement today which is great news the wound bed  does not appear to be significantly callus covered and also does not appear to show any signs of significant infection or really any infection whatsoever. I am very pleased in this regard. In general I think that he is making good progress although this is a lot slower than he would like to see I think we are headed back in the correct direction. He is continuing to use the offloading felt. 11/18/2019 on evaluation today patient appears to be having some callus buildup and cracking that occurred at the wound site. Subsequently actually did have  to perform a fairly significant debridement on the area to clean away some of the callus buildup he tolerated that today with some bleeding with that being said I do feel like the wound appear to be doing much better post debridement. [name], [name] ([id number]) 11/25/2019 upon evaluation today patient appears to be doing somewhat poorly in regard to his foot ulcer. Unfortunately he is having some issues with cellulitis along the lateral aspect of his foot. Unfortunately he seems to be having some issues today with infection/cellulitis in regard to the foot. It still towards the proximal/dorsal surface of where the wound is and this subsequently is the area that always seems to cause him trouble. 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 I do believe we may need to look into this further to ensure that there is nothing more significant going on considering that he seems to have an ongoing issue with this at this point. 12/02/2019 upon evaluation today patient appears to be doing better in regard to his foot ulcer. This is great news. Fortunately there is no signs of active infection systemically which is also excellent. He has been tolerating the dressing changes without complication. With that being said he does tell me that the Augmentin has been causing him some trouble as far as headaches are concerned since he is been taking it. Is unsure if it is directly related or not. Nonetheless as I explained to him it is possible that this could be something that he is experiencing as a result of the antibiotic. The good news is there are other options that we could switch him to that would work equally well while at the same time seeing if indeed the antibiotic was causing the headaches. He does want to proceed with that. 12/09/2019 on evaluation today patient's wound actually showing some signs of improvement. Fortunately there is no signs of active infection at this time great news. No fever  chills noted. He has been using the alginate which I think has done well for him up to this point. No fevers, chills, nausea, vomiting, or diarrhea. 12/18/2019 on evaluation today patient's wound is actually showing signs of fairly good granulation at this point and epithelization. He does have some callus still covering over the little tunnel off to the side that still seems to be staying open despite the fact that we have been trying to keep the callus away from this is much as possible. Fortunately there is no signs of active infection systemically at this time overall I feel like the patient is making good progress in that regard. There is no erythema whatsoever noted. With that being said were still trying to check into MRI in where we stand in that regard. Apparently has been approved but not scheduled. 12/26/2019 upon evaluation today patient appears to be doing decently well in regard to his wound. He does have some callus buildup beginning to clear this away.  Fortunately there is no signs of active infection at this time. No fevers, chills, nausea, vomiting, or diarrhea. 01/05/2020 upon evaluation today patient appears to be doing much better in regard to his foot ulcer. 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 It also did show that he had no evidence of osteomyelitis of the forefoot which was excellent. 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 Overall the fact that he does not have a bone infection is the ideal thing here. 11/13/2019 upon evaluation today patient appears to be doing about the same in regards to his foot ulcer. Unfortunately he has not shown signs of significant improvement at this time which is unfortunate. I feel like that the area where the remaining metatarsal bone is where he had  the fifth ray amputation is actually a significant pressure point this causing some issues here for him. I may want him to have him go back to the podiatrist to see if there is anything that he could have done to try to clear this up a bit. I do not know if he needs any additional surgery to possibly trim down the bone area or if potentially a better offloading shoe may be the better way to go. I do want to see about their expertise in this regard. 01/27/2020 upon evaluation today patient appears to be doing excellent in regard to his wound currently. There is no evidence of active infection which is great news and I am very pleased in that regard. With that being said I do see evidence currently that the patient is still developing a significant amount of callus over the area and again I been try to keep this pared back that has made a big difference in preventing infection and again today he does not appear to be infected. With that being said I do believe that the patient is overall doing well he did see Dr. Amalia Hailey I still do not see that note for review in epic but nonetheless Dr. Amalia Hailey has recommended that based on the shape of the bone and the way it healed that it does need to be shaved down in order to relieve some of the pressure that the patient has been experiencing. For that reason he is going to be undergoing surgery on February 10 in order to try to correct this. Electronic Signature(s) Signed: 01/27/2020 3:26:13 PM By: [name] Keeler [name] Entered By: [name] on 01/27/2020 15:26:12 [name], [name] ([id number]) -------------------------------------------------------------------------------- Physical Exam Details Patient Name: HANNAN, HUTMACHER Date of Service: 01/27/2020 8:15 AM Medical Record Number: [id number] Patient Account Number: [id number] Date of Birth/Sex: 09/02/1965 (55 y.o. M) Treating RN: [name] Primary Care Provider: Berneta [name] Other  Clinician: Referring Provider: Berneta [name] Treating Provider/Extender: [name] in Treatment: 69 Constitutional Well-nourished and well-hydrated in no acute distress. Respiratory normal breathing without difficulty. Psychiatric this patient is able to make decisions and demonstrates good insight into disease process. Alert and Oriented x 3. pleasant and cooperative. Notes Upon inspection patient's wound bed actually showed signs of good granulation at this time. There does not appear to be any evidence of active infection which is great news and overall very pleased with where things stand today. I did perform debridement to clear away some of the callus as well as  minimal slough from the surface of the wound he tolerated all that without complication. Electronic Signature(s) Signed: 01/27/2020 3:26:32 PM By: [name] Keeler [name] Entered By: [name] on 01/27/2020 15:26:32 [name] ([id number]) -------------------------------------------------------------------------------- Physician Orders Details Patient Name: [name] Date of Service: 01/27/2020 8:15 AM Medical Record Number: [id number] Patient Account Number: [id number] Date of Birth/Sex: 11-Dec-1965 (55 y.o. 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 o Nurse Visit as needed Off-Loading Wound #4R Right,Lateral Foot o Open toe surgical shoe o  Offloading felt to foot. Wound Treatment Wound #4R - Foot Wound Laterality: Right, Lateral Cleanser: Byram Ancillary Kit - 15 Day Supply 3 x Per Week/30 Days Discharge Instructions: Use supplies as instructed; Kit contains: (15) Saline Bullets; (15) 3x3 Gauze; 15 pr Gloves Cleanser: Normal Saline 3 x Per Week/30 Days Discharge Instructions: Wash your hands with soap and water. Remove old dressing, discard into plastic bag and place into trash. Cleanse the wound with Normal Saline prior to applying a clean dressing using gauze sponges, not tissues or cotton balls. Do not scrub or use excessive force. Pat dry using gauze sponges, not tissue or cotton balls. Primary Dressing: Endoform 2x2 (in/in) 3 x Per Week/30 Days Discharge Instructions: Apply Endoform as directed Secondary Dressing: Gauze 3 x Per Week/30 Days Discharge Instructions: As directed: dry, moistened with saline or moistened with Dakins Solution Secured With: Conforming Stretch Gauze Bandage 4x75 (in/in) 3 x Per Week/30 Days Discharge Instructions: Apply as directed Electronic Signature(s) Signed: 01/27/2020 3:31:15 PM By: [name] Keeler [name] Signed: 01/29/2020 11:33:31 AM By: [name] Coria RN Entered By: [name] on 01/27/2020 08:42:11 [name] ([id number]) -------------------------------------------------------------------------------- Problem List Details Patient Name: [name] Date of Service: 01/27/2020 8:15 AM Medical Record Number: [id number] Patient Account Number: [id number] Date of Birth/Sex: Jan 21, 1965 (55 y.o. 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 M) Treating RN: [name] Primary Care Provider: Berneta [name] Other Clinician: Referring Provider: Berneta [name] Treating Provider/Extender: [name] in Treatment: 47 Subjective Chief Complaint Information obtained from Patient Right lateral foot ulcer History of Present Illness (HPI) This 55 year old male comes with an ulcerated area on the plantar aspect of the right foot  which she's had for approximately a month. I have known him from a previous visit at [facility] wound center and was treated in the months of April and May 2016 and rapidly healed a left plantar ulcer with a total contact cast. He has been a diabetic for about 16 years and tries to keep active and is fairly compliant with his diabetes management. 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 Past medical history significant for hypertension, hyperlipidemia, and status post appendectomy 1993. He does not smoke or drink alcohol. 01/14/2015 -- the patient had tolerated his total contact cast very well and had no problems and has had no systemic symptoms. However when his total contact cast was cut open he had excessive amount of purulent drainage in spite of being on antibiotics. He had had  a recent x-ray done in the ER 12/22/2014 which showed IMPRESSION:No evidence of osseous erosion. Known soft tissue ulceration is not well characterized on radiograph. Scattered vascular calcifications seen. his last hemoglobin A1c in December was 7.3. He has been on Augmentin and doxycycline for the last 2 weeks. 01/21/2015 -- his culture grew rare growth of Pantoea species an MR moderate growth of Candida parapsilosis. it is sensitive to levofloxacin. He has not heard back from the insurance company regarding his hyperbaric oxygen therapy. His MRI has not been done yet and we will try and get him an earlier date 01/28/2015 -- MRI was done last night -- IMPRESSION:1. Soft tissue ulcer overlying the plantar aspect of the fifth metatarsal head extending to the cortex. Subcortical marrow edema in the fifth metatarsal head with corresponding T1 hypointensity is concerning for early osteomyelitis of the plantar lateral aspect of the fifth metatarsal head. Chest x-ray done on 01/14/2015 shows bronchiectatic changes without infiltrate. EKG done on generally 17 2017 shows a normal sinus rhythm and is a normal EKG. 02/04/2015 -- he was asked to see [name] last week and had 2 appointments but had to cancel both due to pressures of work. Last night he has woken up with severe pain in the foot and leg and it is swollen up. No fever or no change in his blood glucose. Addendum: I spoke with [name] who kindly agreed to accept the patient for inpatient therapy and have also opened to the hospitalist [name], and discuss details of the management including PICC line and repeat cultures. 02/12/2015-- -- was seen by [name] in the hospital and a PICC line was placed. He was to receive Ceftazidime 2 g every 12 hourly, oral levofloxacin 750 mg every 24 hourly and oral fluconazole 200 mg daily. The antibiotics were to be given for 4 weeks except the Diflucan was to be given for the first 2  weeks. Reviewed note from 02/10/2015 -- and [name] had recommended management for growth of MSSA and Serratia. He switched him from ceftazidime to ceftriaxone 2 g every 24 hours. Levofloxacin was stopped and he would continue on fluconazole for another week. He had asked me to decide whether further imaging was necessary and whether surgical debridement of the infected bone was needed. He is doing well and has been off work for this week and we will keep him off the next week. 02/22/2015 -- he was seen by my colleague on 01/19/2015 and at that time an incision and drainage was done on his right lateral forefoot on the dorsum. Today when I probed this wound it is frankly draining pus and it communicates with the ulcer on the plantar  aspect of his right foot. The patient is still on IV ceftriaxone 2 g every 24 hours and is to be seen by [name] on Friday. 03/19/2015 -- On 03/04/2015 I spoke to Dr. 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 He recommended operative debridement which would probably result in the fifth metatarsal head and toe amputation.The patient would be referred back to [place] once he was done with surgery. He was admitted to [facility] yesterday and had surgery done by podiatry for a right fifth metatarsal acute osteomyelitis with cellulitis and abscess. He had a right foot incision and drainage with fifth metatarsal partial amputation and removal of toe infected bone and soft tissue with cultures. The wound was partially closed and packing of the distal end was done. Patient was already on cefepime 2 g IV every 8 hourly and put on vancomycin pending final cultures. He had grown moderate gram-negative rods, and later found to be rare diphtheroids. I received a call from Dr. Cannon Kettle the podiatrist and we  discussed the above. On 03/10/2015 he was found positive for influenza a and has been put on Tamiflu. Since his discharge he has been seen by Dr. Cannon Kettle who is planning to remove his sutures this coming week. He was reviewed by [name] on 03/17/2015 and his Diflucan was stopped and vancomycin. After the 3 doses he is taking. He is going to change Ceftazidime to Zosyn 3.375 g IV every 8 hours. 03/25/2015 -- he was seen by the podiatrist a couple of days ago and the sutures were removed. She will follow back with him in 4 weeks' time and at that time x-rays will be taken and a custom molded insert would be made for his shoe. 04/01/2015 -- he was seen by [name] on 03/29/2015 who pulled the PICC line stopped his IV antibiotics and recommended starting doxycycline and levofloxacin for 2 weeks. He also stop the fluconazole. The patient will follow up with him only when necessary. [name], [name] ([id number]) Readmission: 03/15/17 on evaluation today patient presents for initial evaluation concerning the new issues although he has previously been evaluated in our clinic. Unfortunately the previous evaluation led to the patient having to proceed to amputation and so he is somewhat nervous about being here today. With that being said he has a very slight blister that occurred on the plantar aspect more medial on the right great toe that has been present for just a very short amount of time, several days. With that being said he felt like initially when he called that he somewhat overreacted but due to the fact that his previous issue led to amputation he is very cautious these days I explained that he did the right thing. With that being said he has been tolerating the dressing changes without complication mainly he just been covering this. He does not have any discomfort in secondary to neuropathy it's unlikely that he feels much. With that being said he does tell me that the issue that  he had here is that he went barefoot when he knows he should not have which subsequently led to the blisters. He states is definitely not doing that anymore. His most recent hemoglobin A1c was December and registered 6.9 his ABI today was 1.1. 03/27/17 on evaluation today patient appears to be  doing great in regard to his right great toe ulcer. He has been tolerating the dressing changes without complication. The good news is this is making excellent progress and he seems to be caring for this in an excellent fashion. I see no evidence of breakdown that would make me concerned that he was at risk for infection/amputation. This has obviously been his concern due to the fifth toe amputation that was necessitated previous when he had a similar issue. Nonetheless this seems to be progressing much more nicely. Readmission: 03/13/2019 upon evaluation today patient presents for reevaluation here in the clinic concerning issues he has been having with his right foot on the lateral portion at the proximal end of the metatarsal. Subsequently he tells me that this just opened up in the past couple of days or so and the erythema really began about 24 to 48 hours ago. Fortunately there is no signs of systemic infection. 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 That left him with this bony prominence that is where the wound is currently. There is erythema surrounding that does have me concerned about cellulitis. With that being said he was noncompressible as far as ABIs are concerned I do think we can need to check into arterial studies at this point. Patient does have a history of hypertension along with diabetes mellitus type 2. He also tends to develop quite a bit of callus white often. 03/20/2019 upon evaluation today patient appears to be doing very well in regard to his wounds currently. He has been tolerating the dressing changes without complication.  Fortunately there is no signs of infection. His ABIs were good at this point and registered at 1.07 on the left and 1.09 on the right. In regard to the x-ray this was negative as well for any signs of osteomyelitis. The patient also seems to be doing better in regard to the infection. He still has several days of the antibiotic left at this point best the Bactrim DS and he seems to be doing very well with this. Overall I am extremely pleased with the progress in 1 week's time he is going require some sharp debridement today however. 03/27/2019 upon evaluation today patient actually appears to be making some progress in regard to his wound. It is a little deeper but measuring smaller which is good news due to the fact that again were clear away some of the necrotic tissue which is why the depth is increasing a little bit. Nonetheless after like were getting very close to being down at a good wound bed. Fortunately there is no signs of active infection at this time. There is a little bit of erythema immediately surrounding the wound that we do want to be very cognizant of and careful about. For that reason I am going to go ahead and extend his antibiotic today for an additional 10 days that is the Bactrim. 04/03/2019 upon evaluation today patient appears to be making some progress. I do feel like the Annitta Needs is doing a good job along with the alginate is being packed into the wound space behind. He has been tolerating the dressing changes without complication. Fortunately there is no signs of active infection at this time. No fevers, chills, nausea, vomiting, or diarrhea. 04/10/2019 upon evaluation today patient appears to be making progress. The wound is not quite as deep but he still does not have a great wound surface yet for working on this and the  Santyl does seem to be cleaning things up. Fortunately there is no evidence of active infection at this time. No fevers, chills, nausea, vomiting, or  diarrhea. 04/17/2019 upon evaluation today patient appears to be doing excellent at this time in regard to his foot ulcer. I do believe the Santyl with the alginate packed in behind has been of benefit for him and I am very pleased in this regard. With that being said I am feeling like the wound surface is improving quite a bit each time I see him still I do not believe we are at the point of stating that the wound bed is perfect but again were making progress. The patient is going require some sharp debridement today. 4/22; this is a patient who is using Santyl with calcium alginate. Apparently his wound dimensions have been improving. Patient is changing the dressing himself. He has a surgical shoe. He works in a sitting position therefore is not on his feet all that much. There is undermining 05/01/2019 upon evaluation today patient appears to be doing a little better in regard to the size of his wound though he still has some depth. This does seem to be much cleaner has been using Santyl on the base of the wound followed by packing with silver alginate and behind. Nonetheless I do believe that based on what we are seeing we may need to switch to a collagen type dressing endoform may actually be excellent for him. 05/08/2019 upon evaluation today patient's wound actually appears to be showing better granulation tissue in the base of the wound. With that being said he does have some epiboly noted around the edges of the wound especially plantar and more distal. Subsequently this is can require sharp debridement today. Fortunately there is no signs of active systemic infection though I do feel like there is some local cellulitis noted at this point. The patient also notes has had increased drainage. 5/13; wound volume quite a bit improved this week down to 3 mm. Patient states pain and drainage better. Culture from last week grew staph aureus [not MRSA] and this is sensitive to quinolones and he is on  Levaquin. HOWEVER he also grew abundant Enterococcus faecalis treatment of choice for this is ampicillin. Quinolone coverage is unreliable 5/20 completing the Augmentin. Small wound on the right lateral foot. Thick rolled edges around the wound. Using silver alginate 05/29/2019 upon evaluation today patient appears to be doing better compared to last time I saw him. Fortunately there is no signs of active infection and I do feel like he is actually making good progress. Overall the quality of the wound bed along with the size looks improved and the inflammation/erythema that was previously noted by myself when I saw the couple weeks back actually has also resolved and this is great. Overall very pleased with how things stand. 06/06/2019 upon evaluation today patient actually appears to be doing quite well with regard to his foot ulcer with regard to some of the granulation were seen at this point. There does not appear to be any evidence of systemic or local infection although he still has discomfort I feel like things are moving in a slow but surely correct direction. I think we may need to help fill some of the space however even though he is using the collagen we may need to have something behind this such as a plain packing strip to try to help hold the collagen in the base of the wound. 06/12/2019 upon evaluation  today patient appears to be doing well with regard to his foot ulcer. This seems to be making some slow but steady progress. The wound does not appear to be as deep today compared to where it was previous. Fortunately there is no signs of active infection at [name], [name]. ([id number]) this time. No fevers, chills, nausea, vomiting, or diarrhea. 06/19/2019 on evaluation today patient's wound does not appear to be doing quite as well as what I would like to see. Fortunately there is no signs of systemic infection at this time which is great news. With that being said I do believe that  the patient needs to have a wound culture as I do believe he likely has a local infection that needs to be addressed. He is also can require some debridement today as well. 06/26/2019 upon evaluation today patient's wound actually appears to be showing signs of excellent improvement today. There does not appear to be any evidence of active infection which is great news. There does not appear to be significant erythema at this point. The patient does have improvement overall in the appearance of the infection he has been taking the Augmentin the culture came back showing no organism was grown at this point but nonetheless I do believe he has been doing well with the antibiotics I would continue that at this point. 07/03/2019 upon evaluation today patient appears to be doing about the same in regard to his wound. There does not appear to be any evidence of active infection and overall I am pleased with where things stand. The patient is tolerating the dressing changes without complication but it does not appear that they are packing the wound at this time which I think is going to slow down his healing prospects. 07/10/2019 upon evaluation today patient appears to be doing little better in my opinion in regard to the overall moisture collected in the base of the wound. They have been packing this with a little bit of silver alginate and that has done quite well. Overall I am pleased in that regard. Subsequently the redness does not appear to be any worse today I did actually go ahead and marked this since this can be 2 weeks between now and when I see him again I would make sure that if anything changes or worsens he will be able to notice this and go ahead and initiate treatment with the Augmentin. 07/22/2019 patient comes in today a little bit early for evaluation secondary to the fact that he was having issues with increased drainage and concern about infection. With that being said he does appear to have  developed a blister area again proximal to where the original wound opening was in although the wound bed itself actually appears to be doing somewhat better he has had increased drainage and tracking of fluid here due to this blister. Nonetheless I believe that that does need to be cleaned away today but again I am not really certain that I see a lot of evidence for infection at this point. 07/29/2019 on evaluation today patient actually appears to be doing better in regard to his foot ulcer. There is improvement compared to last week which is great news. Nonetheless he does come prepared to apply the total contact cast today which I think would be beneficial for him. He knows he is not supposed to drive he did bring his son with him today in order to drive him. 07/31/2019 upon evaluation today patient appears to be doing  well with regard to his wound today. I do not see any signs of infection I do believe that he is definitely showing some signs of improvement here in the office in a couple days as far as the overall erythema and pressure getting to the wound area is concerned. With that being said I do believe that the cast has been of great benefit for him. 08/08/2019 upon evaluation today patient appears to be doing better in regard to his wound. Overall I feel like he is showing signs of improvement. The external measurement is really not bad at all although I did remove some of the callus in order to open up the wound itself the depth is actually coming quite well and I am extremely pleased in that regard I do not think we can to see much improvement externally until we get the internal depth filled in and that again is improved. Fortunately there is no signs of active infection at this time. No fevers, chills, nausea, vomiting, or diarrhea. 08/15/2019 on evaluation today patient actually appears to be doing well with regard to his foot ulcer. This is showing some signs of minimal improvement from  the standpoint of the depth and appearance of the wound as well as the edges of the wound. It is taking its time and to be honest I do believe that he seems to be progressing well. I am happy with the cast and what it is achieving at this point. Have contemplated checking into Dermagraft for Apligraf for the patient but again he does seem to be making fairly good progress in my opinion. If it has not tremendously changed come next week I may consider one of the 2. 08/22/2019 on evaluation today patient appears to be making good progress with regard to his wound I am still very pleased with where things stand today. Overall I think that he is making good improvements week by week and I believe that we are getting continue with the Total contact cast I will likely switch to a silver alginate dressing. 08/29/2019 on evaluation today patient appears to be doing quite well with regard to his wound. Fortunately this is measuring quite small. There is no signs of active infection at this time although he did have a little bit of a callus area just to the side of his foot where we previously noted some fluid collecting underneath that was the case today as well. I did have to remove this. With that being said overall he seems to be doing quite well. 09/05/19 on evaluation today patient appears to be doing very well with regard to his original wound which is actually very small on evaluation today. With that being said he does have a blistered area that's more proximal to where this wound is that has been a little bit more concerned. Fortunately there is no signs of active infection at this time. I'm going to need to remove the blister tissue today. 10 09/12/2019 on evaluation today patient actually appears to be doing much better in regard to his wound. I think he is actually improved more without the cast that he was prior with the cast. Overall very pleased with where things stand today. 09/22/2019 upon  evaluation today patient appears to be doing very well at this time in regard to his foot ulcer. In fact I feel like he is continue to make good progress. I see no signs right now that there is any active infection which is great news. Also  see very little evidence of pressure he does have some callus buildup but again this seems to be minimal. 09/29/2019 upon evaluation today patient appears to be doing well with regard to his foot ulcer. He has been tolerating the dressing changes without complication. Fortunately there is no signs of active infection at this time. No fevers, chills, nausea, vomiting, or diarrhea. 10/28/2019 upon evaluation today patient appears to be doing a little bit more poorly than last time I saw him as last time we assumed he was completely healed. Apparently that was not the case or else this is reopened but I tend to believe that this probably has never completely closed internally though it appeared to be. He has been using the offloading felt which seems to have done well there is no signs of redness no evidence of erythema all of which is good news. Nonetheless I am concerned about the fact that he is having issues here with what appears to be a reopening of the wound. 11/04/2019 upon evaluation today patient's wound actually showed signs of fairly good improvement today which is great news the wound bed does not appear to be significantly callus covered and also does not appear to show any signs of significant infection or really any infection whatsoever. I am very pleased in this regard. In general I think that he is making good progress although this is a lot slower than he would like to see I think we are headed back in the correct direction. He is continuing to use the offloading felt. [name], [name] ([id number]) 11/18/2019 on evaluation today patient appears to be having some callus buildup and cracking that occurred at the wound site. Subsequently actually  did have to perform a fairly significant debridement on the area to clean away some of the callus buildup he tolerated that today with some bleeding with that being said I do feel like the wound appear to be doing much better post debridement. 11/25/2019 upon evaluation today patient appears to be doing somewhat poorly in regard to his foot ulcer. Unfortunately he is having some issues with cellulitis along the lateral aspect of his foot. Unfortunately he seems to be having some issues today with infection/cellulitis in regard to the foot. It still towards the proximal/dorsal surface of where the wound is and this subsequently is the area that always seems to cause him trouble. 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 I do believe we may need to look into this further to ensure that there is nothing more significant going on considering that he seems to have an ongoing issue with this at this point. 12/02/2019 upon evaluation today patient appears to be doing better in regard to his foot ulcer. This is great news. Fortunately there is no signs of active infection systemically which is also excellent. He has been tolerating the dressing changes without complication. With that being said he does tell me that the Augmentin has been causing him some trouble as far as headaches are concerned since he is been taking it. Is unsure if it is directly related or not. Nonetheless as I explained to him it is possible that this could be something that he is experiencing as a result of the antibiotic. The good news is there are other options that we could switch him to that would work equally well while at the same time seeing if indeed the antibiotic was causing the headaches. He does want to proceed with that. 12/09/2019 on evaluation today patient's  wound actually showing some signs of improvement. Fortunately there is no signs of active infection at this time great news. No fever chills noted. He has been  using the alginate which I think has done well for him up to this point. No fevers, chills, nausea, vomiting, or diarrhea. 12/18/2019 on evaluation today patient's wound is actually showing signs of fairly good granulation at this point and epithelization. He does have some callus still covering over the little tunnel off to the side that still seems to be staying open despite the fact that we have been trying to keep the callus away from this is much as possible. Fortunately there is no signs of active infection systemically at this time overall I feel like the patient is making good progress in that regard. There is no erythema whatsoever noted. With that being said were still trying to check into MRI in where we stand in that regard. Apparently has been approved but not scheduled. 12/26/2019 upon evaluation today patient appears to be doing decently well in regard to his wound. He does have some callus buildup beginning to clear this away. Fortunately there is no signs of active infection at this time. No fevers, chills, nausea, vomiting, or diarrhea. 01/05/2020 upon evaluation today patient appears to be doing much better in regard to his foot ulcer. 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 It also did show that he had no evidence of osteomyelitis of the forefoot which was excellent. 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 Overall the fact that he does not have a bone infection is the ideal thing here. 11/13/2019 upon evaluation today patient appears to be doing about the same in regards to his foot ulcer. Unfortunately he has not shown signs of significant improvement at this time which is unfortunate. I feel like that the area where the remaining metatarsal bone is where he had the fifth ray amputation is  actually a significant pressure point this causing some issues here for him. I may want him to have him go back to the podiatrist to see if there is anything that he could have done to try to clear this up a bit. I do not know if he needs any additional surgery to possibly trim down the bone area or if potentially a better offloading shoe may be the better way to go. I do want to see about their expertise in this regard. 01/27/2020 upon evaluation today patient appears to be doing excellent in regard to his wound currently. There is no evidence of active infection which is great news and I am very pleased in that regard. With that being said I do see evidence currently that the patient is still developing a significant amount of callus over the area and again I been try to keep this pared back that has made a big difference in preventing infection and again today he does not appear to be infected. With that being said I do believe that the patient is overall doing well he did see Dr. Amalia Hailey I still do not see that note for review in epic but nonetheless Dr. Amalia Hailey has recommended that based on the shape of the bone and the way it healed that it does need to be shaved down in order to relieve some of the  pressure that the patient has been experiencing. For that reason he is going to be undergoing surgery on February 10 in order to try to correct this. Objective Constitutional Well-nourished and well-hydrated in no acute distress. Vitals Time Taken: 8:06 AM, Height: 74 in, Weight: 308 lbs, BMI: 39.5, Temperature: 98.2 F, Pulse: 76 bpm, Respiratory Rate: 18 breaths/min, Blood Pressure: 155/79 mmHg. Respiratory normal breathing without difficulty. Psychiatric this patient is able to make decisions and demonstrates good insight into disease process. Alert and Oriented x 3. pleasant and cooperative. General Notes: Upon inspection patient's wound bed actually showed signs of good granulation at this  time. There does not appear to be any evidence of active infection which is great news and overall very pleased with where things stand today. I did perform debridement to clear away some of the callus as well as minimal slough from the surface of the wound he tolerated all that without complication. [name], [name] ([id number]) Integumentary (Hair, Skin) Wound #4R status is Open. Original cause of wound was Gradually Appeared. The wound is located on the Right,Lateral Foot. The wound measures 0.4cm length x 0.5cm width x 0.1cm depth; 0.157cm^2 area and 0.016cm^3 volume. There is Fat Layer (Subcutaneous Tissue) exposed. There is no tunneling or undermining noted. There is a small amount of serosanguineous drainage noted. The wound margin is thickened. There is large (67-100%) red granulation within the wound bed. There is a small (1-33%) amount of necrotic tissue within the wound bed including Adherent Slough. Assessment Active Problems ICD-10 Type 2 diabetes mellitus with foot ulcer 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d Cellulitis of right lower limb Corns and callosities Essential (primary) hypertension Acquired absence of other left toe(s) Procedures Wound #4R Pre-procedure diagnosis of Wound #4R is a Diabetic Wound/Ulcer of the Lower Extremity located on the Right,Lateral Foot .Severity of Tissue Pre Debridement is: Fat layer exposed. There was a Excisional Skin/Subcutaneous Tissue Debridement with a total area of 0.2 sq cm performed by [name]. With the following instrument(s): Curette to remove Viable and Non-Viable tissue/material. Material removed includes Callus, Subcutaneous Tissue, Slough, Skin: Dermis, and Skin: Epidermis after achieving pain control using Lidocaine 4% Topical Solution. No specimens were taken. A time out was conducted at 08:25, prior to the start of the procedure. A Minimum amount of bleeding was  controlled with Pressure. The procedure was tolerated well with a pain level of 0 throughout and a pain level of 0 following the procedure. Post Debridement Measurements: 0.4cm length x 0.5cm width x 0.1cm depth; 0.016cm^3 volume. Character of Wound/Ulcer Post Debridement is improved. Severity of Tissue Post Debridement is: Fat layer exposed. Post procedure Diagnosis Wound #4R: Same as Pre-Procedure Plan Follow-up Appointments: Return Appointment in 1 week. Nurse Visit as needed Off-Loading: Wound #4R Right,Lateral Foot: Open toe surgical shoe Offloading felt to foot. WOUND #4R: - Foot Wound Laterality: Right, Lateral Cleanser: Byram Ancillary Kit - 15 Day Supply 3 x Per Week/30 Days Discharge Instructions: Use supplies as instructed; Kit contains: (15) Saline Bullets; (15) 3x3 Gauze; 15 pr Gloves Cleanser: Normal Saline 3 x Per Week/30 Days Discharge Instructions: Wash your hands with soap and water. Remove old dressing, discard into plastic bag and place into trash. Cleanse the wound with Normal Saline prior to applying a clean dressing using gauze sponges, not tissues or cotton balls. Do not scrub or use excessive force. Pat dry using gauze sponges, not tissue or cotton balls. Primary Dressing: Endoform 2x2 (in/in)  3 x Per Week/30 Days Discharge Instructions: Apply Endoform as directed Secondary Dressing: Gauze 3 x Per Week/30 Days Discharge Instructions: As directed: dry, moistened with saline or moistened with Dakins Solution Secured With: Conforming Stretch Gauze Bandage 4x75 (in/in) 3 x Per Week/30 Days Discharge Instructions: Apply as directed 1. Would recommend currently that we continue with the wound care measures as before. We have been using the silver collagen which I think is doing a good job and again I think the key factors keeping the callus pared away in order to prevent any type of abscess and infection. 2. I am also can recommend that we have the patient continue to  as much as possible offload the foot although I know he still working and still [name], [name] ([id number]) has to get around and do things. 3. Again the main thing we are waiting on right now is on 10 February the patient having surgery with Dr. Amalia Hailey to try to help shave down some of the bone which is causing a next sessile protrusion here which in turn I think is causing part of the issue from a pressure standpoint. My hope is that this will make things significantly better for the patient obviously. We will see patient back for reevaluation in 1 week here in the clinic. If anything worsens or changes patient will contact our office for additional recommendations. Electronic Signature(s) Signed: 01/27/2020 3:27:49 PM By: [name] Keeler [name] Entered By: [name] on 01/27/2020 15:27:48 [name] ([id number]) -------------------------------------------------------------------------------- SuperBill Details Patient Name: [name] Date of Service: 01/27/2020 Medical Record Number: [id number] Patient Account Number: [id number] Date of Birth/Sex: [birth year] (55 y.o. M) Treating RN: [name] Primary Care Provider: Berneta [name] Other Clinician: Referring Provider: Berneta [name] Treating Provider/Extender: [name] in Treatment: 45 Diagnosis Coding ICD-10 Codes Code Description E11.621 Type 2 diabetes mellitus with foot ulcer L97.512 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d L03.115 Cellulitis of right lower limb L84 Corns and callosities I10 Essential (primary) hypertension Z89.422 Acquired absence of other left toe(s) Facility Procedures CPT4 Code: [id number] Description: 67519 - DEB SUBQ TISSUE 20 SQ CM/< Modifier: Quantity: 1 CPT4 Code: Description: ICD-10 Diagnosis Description L97.512 Non-pressure chronic ulcer of other part of right foot with fat layer ex Modifier: posed Quantity: Physician  Procedures CPT4 Code: 8242998 Description: 11042 - WC PHYS SUBQ TISS 20 SQ CM Modifier: Quantity: 1 CPT4 Code: Description: ICD-10 Diagnosis Description L97.512 Non-pressure chronic ulcer of other part of right foot with fat layer ex Modifier: posed Quantity: Electronic Signature(s) Signed: 01/27/2020 3:28:16 PM By: [name] Keeler [name] Entered By: [name] on 01/27/2020 15:28:15

## 2020-01-29 NOTE — Progress Notes (Signed)
[name], [name] ([id number]) Visit Report for 01/27/2020 Arrival Information Details Patient Name: [name], [name]. Date of Service: 01/27/2020 8:15 AM Medical Record Number: [id number] Patient Account Number: [id number] Date of Birth/Sex: 1965-11-19 (55 y.o. M) Treating RN: Cornell Barman Primary Care Zakhai Meisinger: Berneta Sages Other Clinician: Referring Saahir Prude: Berneta Sages Treating Skyelynn Rambeau/Extender: Skipper Cliche in Treatment: 43 Visit Information History Since Last Visit Added or deleted any medications: No Patient Arrived: Ambulatory Any new allergies or adverse reactions: No Arrival Time: 08:07 Had a fall or experienced change in No Accompanied By: self activities of daily living that may affect Transfer Assistance: None risk of falls: Patient Identification Verified: Yes Signs or symptoms of abuse/neglect since last visito No Secondary Verification Process Completed: Yes Hospitalized since last visit: No Patient Requires Transmission-Based Precautions: No Implantable device outside of the clinic excluding No Patient Has Alerts: Yes cellular tissue based products placed in the center Patient Alerts: DMII since last visit: Has Dressing in Place as Prescribed: Yes Pain Present Now: No Electronic Signature(s) Signed: 01/27/2020 4:32:12 PM By: Lorine Bears RCP, RRT, CHT Entered By: Lorine Bears on 01/27/2020 08:08:31 [name] ([id number]) -------------------------------------------------------------------------------- Clinic Level of Care Assessment Details Patient Name: [name] Date of Service: 01/27/2020 8:15 AM Medical Record Number: [id number] Patient Account Number: [id number] Date of Birth/Sex: May 02, 1965 (55 y.o. M) Treating RN: Carlene Coria Primary Care Leilan Bochenek: Berneta Sages Other Clinician: Referring Reis Pienta: Berneta Sages Treating Katlen Seyer/Extender: Skipper Cliche in Treatment: 45 Clinic Level of Care  Assessment Items TOOL 1 Quantity Score '[]'  - Use when EandM and Procedure is performed on INITIAL visit 0 ASSESSMENTS - Nursing Assessment / Reassessment '[]'  - General Physical Exam (combine w/ comprehensive assessment (listed just below) when performed on new 0 pt. evals) '[]'  - 0 Comprehensive Assessment (HX, ROS, Risk Assessments, Wounds Hx, etc.) ASSESSMENTS - Wound and Skin Assessment / Reassessment '[]'  - Dermatologic / Skin Assessment (not related to wound area) 0 ASSESSMENTS - Ostomy and/or Continence Assessment and Care '[]'  - Incontinence Assessment and Management 0 '[]'  - 0 Ostomy Care Assessment and Management (repouching, etc.) PROCESS - Coordination of Care '[]'  - Simple Patient / Family Education for ongoing care 0 '[]'  - 0 Complex (extensive) Patient / Family Education for ongoing care '[]'  - 0 Staff obtains Programmer, systems, Records, Test Results / Process Orders '[]'  - 0 Staff telephones HHA, Nursing Homes / Clarify orders / etc '[]'  - 0 Routine Transfer to another Facility (non-emergent condition) '[]'  - 0 Routine Hospital Admission (non-emergent condition) '[]'  - 0 New Admissions / Biomedical engineer / Ordering NPWT, Apligraf, etc. '[]'  - 0 Emergency Hospital Admission (emergent condition) PROCESS - Special Needs '[]'  - Pediatric / Minor Patient Management 0 '[]'  - 0 Isolation Patient Management '[]'  - 0 Hearing / Language / Visual special needs '[]'  - 0 Assessment of Community assistance (transportation, D/C planning, etc.) '[]'  - 0 Additional assistance / Altered mentation '[]'  - 0 Support Surface(s) Assessment (bed, cushion, seat, etc.) INTERVENTIONS - Miscellaneous '[]'  - External ear exam 0 '[]'  - 0 Patient Transfer (multiple staff / Civil Service fast streamer / Similar devices) '[]'  - 0 Simple Staple / Suture removal (25 or less) '[]'  - 0 Complex Staple / Suture removal (26 or more) '[]'  - 0 Hypo/Hyperglycemic Management (do not check if billed separately) '[]'  - 0 Ankle / Brachial Index (ABI) - do not  check if billed separately Has the patient been seen at the hospital within the last three years: Yes Total Score: 0 Level Of  Care: ____ [name] ([id number]) Electronic Signature(s) Signed: 01/29/2020 11:33:31 AM By: Carlene Coria RN Entered By: Carlene Coria on 01/27/2020 08:29:29 [name] ([id number]) -------------------------------------------------------------------------------- Encounter Discharge Information Details Patient Name: [name], [name]. Date of Service: 01/27/2020 8:15 AM Medical Record Number: [id number] Patient Account Number: [id number] Date of Birth/Sex: 06-17-1965 (55 y.o. M) Treating RN: Carlene Coria Primary Care Dietrich Ke: Berneta Sages Other Clinician: Referring Dalani Mette: Berneta Sages Treating Nadeem Romanoski/Extender: Skipper Cliche in Treatment: 62 Encounter Discharge Information Items Post Procedure Vitals Discharge Condition: Stable Temperature (F): 98.2 Ambulatory Status: Ambulatory Pulse (bpm): 76 Discharge Destination: Home Respiratory Rate (breaths/min): 18 Transportation: Private Auto Blood Pressure (mmHg): 155/79 Accompanied By: self Schedule Follow-up Appointment: Yes Clinical Summary of Care: Patient Declined Electronic Signature(s) Signed: 01/29/2020 11:33:31 AM By: Carlene Coria RN Entered By: Carlene Coria on 01/27/2020 08:31:19 [name] ([id number]) -------------------------------------------------------------------------------- Lower Extremity Assessment Details Patient Name: [name] Date of Service: 01/27/2020 8:15 AM Medical Record Number: [id number] Patient Account Number: [id number] Date of Birth/Sex: [birth year] (55 y.o. M) Treating RN: Carlene Coria Primary Care Aleczander Fandino: Berneta Sages Other Clinician: Referring Yumna Ebers: Berneta Sages Treating Leahann Lempke/Extender: Skipper Cliche in Treatment: 45 Edema Assessment Assessed: [Left: No] [Right: No] [Left: Edema] [Right: :] Calf Left:  Right: Point of Measurement: 44 cm From Medial Instep 40 cm Ankle Left: Right: Point of Measurement: 13 cm From Medial Instep 24 cm Vascular Assessment Pulses: Dorsalis Pedis Palpable: [Right:Yes] Electronic Signature(s) Signed: 01/29/2020 11:33:31 AM By: Carlene Coria RN Entered By: Carlene Coria on 01/27/2020 08:17:53 [name] ([id number]) -------------------------------------------------------------------------------- Multi Wound Chart Details Patient Name: [name] Date of Service: 01/27/2020 8:15 AM Medical Record Number: [id number] Patient Account Number: [id number] Date of Birth/Sex: 06-01-1965 (55 y.o. M) Treating RN: Carlene Coria Primary Care Edwardine Deschepper: Berneta Sages Other Clinician: Referring Jonathyn Carothers: Berneta Sages Treating Josue Kass/Extender: Skipper Cliche in Treatment: 45 Vital Signs Height(in): 74 Pulse(bpm): 76 Weight(lbs): 308 Blood Pressure(mmHg): 155/79 Body Mass Index(BMI): 40 Temperature(F): 98.2 Respiratory Rate(breaths/min): 18 Photos: [N/A:N/A] Wound Location: Right, Lateral Foot N/A N/A Wounding Event: Gradually Appeared N/A N/A Primary Etiology: Diabetic Wound/Ulcer of the Lower N/A N/A Extremity Comorbid History: Hypertension, Type II Diabetes, N/A N/A Neuropathy Date Acquired: 03/12/2019 N/A N/A Weeks of Treatment: 45 N/A N/A Wound Status: Open N/A N/A Wound Recurrence: Yes N/A N/A Pending Amputation on Yes N/A N/A Presentation: Measurements L x W x D (cm) 0.4x0.5x0.1 N/A N/A Area (cm) : 0.157 N/A N/A Volume (cm) : 0.016 N/A N/A % Reduction in Area: 75.30% N/A N/A % Reduction in Volume: 87.40% N/A N/A Classification: Grade 1 N/A N/A Exudate Amount: Small N/A N/A Exudate Type: Serosanguineous N/A N/A Exudate Color: red, brown N/A N/A Wound Margin: Thickened N/A N/A Granulation Amount: Large (67-100%) N/A N/A Granulation Quality: Red N/A N/A Necrotic Amount: Small (1-33%) N/A N/A Exposed Structures: Fat Layer  (Subcutaneous Tissue): N/A N/A Yes Fascia: No Tendon: No Muscle: No Joint: No Bone: No Epithelialization: Small (1-33%) N/A N/A Treatment Notes Electronic Signature(s) Signed: 01/29/2020 11:33:31 AM By: Carlene Coria RN Entered By: Carlene Coria on 01/27/2020 08:27:11 [name] ([name] ([id number]) -------------------------------------------------------------------------------- [place] Details Patient Name: CRESPIN, FORSTROM. Date of Service: 01/27/2020 8:15 AM Medical Record Number: [id number] Patient Account Number: [id number] Date of Birth/Sex: 29-Jul-1965 (55 y.o. M) Treating RN: Carlene Coria Primary Care Miyani Cronic: Berneta Sages Other Clinician: Referring Noble Cicalese: Berneta Sages Treating Estell Puccini/Extender: Skipper Cliche in Treatment: 59 Active Inactive Necrotic Tissue Nursing Diagnoses: Impaired tissue integrity related to  necrotic/devitalized tissue Knowledge deficit related to management of necrotic/devitalized tissue Goals: Necrotic/devitalized tissue will be minimized in the wound bed Date Initiated: 11/25/2019 Target Resolution Date: 12/11/2019 Goal Status: Active Patient/caregiver will verbalize understanding of reason and process for debridement of necrotic tissue Date Initiated: 11/25/2019 Target Resolution Date: 12/11/2019 Goal Status: Active Interventions: Assess patient pain level pre-, during and post procedure and prior to discharge Treatment Activities: Excisional debridement : 11/25/2019 Notes: Nutrition Nursing Diagnoses: Impaired glucose control: actual or potential Goals: Patient/caregiver verbalizes understanding of need to maintain therapeutic glucose control per primary care physician Date Initiated: 03/13/2019 Target Resolution Date: 04/11/2019 Goal Status: Active Interventions: Assess patient nutrition upon admission and as needed per policy Notes: Orientation to the Wound Care  Program Nursing Diagnoses: Knowledge deficit related to the wound healing center program Goals: Patient/caregiver will verbalize understanding of the [place] Date Initiated: 03/13/2019 Target Resolution Date: 04/11/2019 Goal Status: Active Interventions: Provide education on orientation to the wound center Notes: Wound/Skin Impairment Nursing Diagnoses: LETCHER, SCHWEIKERT ([id number]) Impaired tissue integrity Goals: Ulcer/skin breakdown will have a volume reduction of 30% by week 4 Date Initiated: 03/13/2019 Target Resolution Date: 04/11/2019 Goal Status: Active Interventions: Assess ulceration(s) every visit Notes: Electronic Signature(s) Signed: 01/29/2020 11:33:31 AM By: Carlene Coria RN Entered By: Carlene Coria on 01/27/2020 08:27:01 [name] ([id number]) -------------------------------------------------------------------------------- Pain Assessment Details Patient Name: [name] Date of Service: 01/27/2020 8:15 AM Medical Record Number: [id number] Patient Account Number: [id number] Date of Birth/Sex: 12/20/1965 (55 y.o. M) Treating RN: Carlene Coria Primary Care Rashiya Lofland: Berneta Sages Other Clinician: Referring Syanna Remmert: Berneta Sages Treating Maclovio Henson/Extender: Skipper Cliche in Treatment: 16 Active Problems Location of Pain Severity and Description of Pain Patient Has Paino No Site Locations Pain Management and Medication Current Pain Management: Electronic Signature(s) Signed: 01/29/2020 11:33:31 AM By: Carlene Coria RN Entered By: Carlene Coria on 01/27/2020 08:10:56 [name] ([id number]) -------------------------------------------------------------------------------- Patient/Caregiver Education Details Patient Name: [name] Date of Service: 01/27/2020 8:15 AM Medical Record Number: [id number] Patient Account Number: [id number] Date of Birth/Gender: April 04, 1965 (55 y.o. M) Treating RN: Carlene Coria Primary Care Physician: Berneta Sages Other Clinician: Referring Physician: Berneta Sages Treating Physician/Extender: Skipper Cliche in Treatment: 92 Education Assessment Education Provided To: Patient Education Topics Provided Welcome To [facility] [place]: Methods: Explain/Verbal Responses: State content correctly Electronic Signature(s) Signed: 01/29/2020 11:33:31 AM By: Carlene Coria RN Entered By: Carlene Coria on 01/27/2020 08:29:52 [name] ([id number]) -------------------------------------------------------------------------------- Wound Assessment Details Patient Name: [name] Date of Service: 01/27/2020 8:15 AM Medical Record Number: [id number] Patient Account Number: [id number] Date of Birth/Sex: 05-Mar-1965 (55 y.o. M) Treating RN: Carlene Coria Primary Care Sevana Grandinetti: Berneta Sages Other Clinician: Referring Amariah Kierstead: Berneta Sages Treating Khalee Mazo/Extender: Skipper Cliche in Treatment: 45 Wound Status Wound Number: [id number] Primary Etiology: Diabetic Wound/Ulcer of the Lower Extremity Wound Location: Right, Lateral Foot Wound Status: Open Wounding Event: Gradually Appeared Comorbid History: Hypertension, Type II Diabetes, Neuropathy Date Acquired: 03/12/2019 Weeks Of Treatment: 45 Clustered Wound: No Pending Amputation On Presentation Photos Wound Measurements Length: (cm) 0.4 Width: (cm) 0.5 Depth: (cm) 0.1 Area: (cm) 0.157 Volume: (cm) 0.016 % Reduction in Area: 75.3% % Reduction in Volume: 87.4% Epithelialization: Small (1-33%) Tunneling: No Undermining: No Wound Description Classification: Grade 1 Wound Margin: Thickened Exudate Amount: Small Exudate Type: Serosanguineous Exudate Color: red, brown Foul Odor After Cleansing: No Slough/Fibrino Yes Wound Bed Granulation Amount: Large (67-100%) Exposed Structure Granulation Quality: Red Fascia Exposed: No Necrotic Amount: Small (1-33%)  Fat Layer (Subcutaneous Tissue)  Exposed: Yes Necrotic Quality: Adherent Slough Tendon Exposed: No Muscle Exposed: No Joint Exposed: No Bone Exposed: No Treatment Notes Wound #4R (Foot) Wound Laterality: Right, Lateral Cleanser [place] Ancillary Kit - 15 Day Supply Discharge Instruction: Use supplies as instructed; Kit contains: (15) Saline Bullets; (15) 3x3 Gauze; 15 pr Gloves Normal Saline KRISTOPH, SATTLER ([id number]) Discharge Instruction: Wash your hands with soap and water. Remove old dressing, discard into plastic bag and place into trash. Cleanse the wound with Normal Saline prior to applying a clean dressing using gauze sponges, not tissues or cotton balls. Do not scrub or use excessive force. Pat dry using gauze sponges, not tissue or cotton balls. Peri-Wound Care Topical Primary Dressing Endoform 2x2 (in/in) Discharge Instruction: Apply Endoform as directed Secondary Dressing Gauze Discharge Instruction: As directed: dry, moistened with saline or moistened with Dakins Solution Secured With Conforming Stretch Gauze Bandage 4x75 (in/in) Discharge Instruction: Apply as directed Compression Wrap Compression Stockings Add-Ons Electronic Signature(s) Signed: 01/29/2020 11:33:31 AM By: Carlene Coria RN Entered By: Carlene Coria on 01/27/2020 08:16:21 [name] ([id number]) -------------------------------------------------------------------------------- [place] Details Patient Name: [name] Date of Service: 01/27/2020 8:15 AM Medical Record Number: [id number] Patient Account Number: [id number] Date of Birth/Sex: 1965/07/24 (55 y.o. M) Treating RN: Cornell Barman Primary Care Yeraldine Forney: Berneta Sages Other Clinician: Referring Hayze Gazda: Berneta Sages Treating Adil Tugwell/Extender: Skipper Cliche in Treatment: 45 Vital Signs Time Taken: 08:06 Temperature (F): 98.2 Height (in): 74 Pulse (bpm): 76 Weight (lbs): 308 Respiratory Rate (breaths/min): 18 Body Mass Index (BMI): 39.5 Blood  Pressure (mmHg): 155/79 Reference Range: 80 - 120 mg / dl Electronic Signature(s) Signed: 01/27/2020 4:32:12 PM By: Lorine Bears RCP, RRT, CHT Entered By: Lorine Bears on 01/27/2020 08:09:18

## 2020-02-02 NOTE — Progress Notes (Signed)
Subjective:  55 y.o. 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  Patient states that the wound has been present for about 10 months now.  He is seeing wound care weekly and was referred for possible surgical consultation.  He is very compliant and has been treated at the wound care center but there has been no improvement despite multiple conservative modalities including offloading in a postsurgical shoe, felt pad offloads, and serial wound care debridements. Most recently he had an MRI performed 2019-12-31 which was negative for osteomyelitis.  He presents for further treatment evaluation   Past Medical History:  Diagnosis Date  . Broken ankle   . Broken arm   . Diabetes mellitus without complication ([place])    type 2  . Diabetic retinopathy ([place])    PDR OU  . History of Bell's palsy   . Hyperlipidemia   . Hypertension   . Osteomyelitis ([place])    foot      Objective/Physical Exam General: The patient is alert and oriented x3 in no acute distress.  Dermatology:  Wound #1 noted to the fifth metatarsal base measuring approximately 1.0 x 1.0 x 0.3 cm (LxWxD).   To the noted ulceration(s), there is no eschar. There is a moderate amount of slough, fibrin, and necrotic tissue noted. Granulation tissue and wound base is red. There is a minimal amount of serosanguineous drainage noted. There is no exposed bone muscle-tendon ligament or joint. There is no malodor. Periwound integrity is intact. Skin is warm, dry and supple bilateral lower extremities.  Vascular: Palpable pedal pulses bilaterally. No edema or erythema noted. Capillary refill within normal limits.  Neurological: Epicritic and protective threshold diminished bilaterally.   Musculoskeletal Exam: History of partial fifth ray amputation right foot  MRI  impression 2019-12-31 right foot: 1. Prior partial amputation of the fifth ray with the fifth metatarsal base remaining. Small soft tissue wound along the lateral aspect of the forefoot at the amputation site. No evidence of osteomyelitis of the right forefoot. 2. Osteoarthritic changes of the right foot as described above.  Assessment: 1.  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 diabetes mellitus w/ peripheral neuropathy 3.  Exostosis fifth metatarsal base   Plan of Care:  1. Patient was evaluated. 2. Today we discussed the conservative versus surgical management of the presenting pathology. The patient opts for surgical management. All possible complications and details of the procedure were explained. All patient questions were answered. No guarantees were expressed or implied. 3. Authorization for surgery was initiated today. Surgery will consist of surgical planning/exostectomy of the fifth metatarsal base.  Debridement of ulcer right foot.  I did explain to the patient that we cannot simply remove the entire base of the fifth metatarsal due to the insertion of the peroneal's and tendon balancing.  I do believe there is enough osseous bone of the fifth metatarsal tubercle to do a planing and alleviate some of the pressure from the underlying ulcer. 4.  Return to clinic 1 week postop  *Goes by [employer]. Works Editor, commissioning for [employer].   Edrick Kins, DPM [facility]  Dr. Edrick Kins, DPM    [address]  [place], [place] [address]                Office [phone]  Fax [phone]

## 2020-02-03 ENCOUNTER — Other Ambulatory Visit: Payer: Self-pay

## 2020-02-03 ENCOUNTER — Encounter: Payer: Managed Care, Other (non HMO) | Attending: Physician Assistant | Admitting: Physician Assistant

## 2020-02-03 ENCOUNTER — Telehealth: Payer: Self-pay

## 2020-02-03 DIAGNOSIS — L97512 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d: Secondary | ICD-10-CM | POA: Insufficient documentation

## 2020-02-03 DIAGNOSIS — E11621 Type 2 diabetes mellitus with foot ulcer: Secondary | ICD-10-CM | POA: Insufficient documentation

## 2020-02-03 DIAGNOSIS — E1140 Type 2 diabetes mellitus with diabetic neuropathy, unspecified: Secondary | ICD-10-CM | POA: Diagnosis not present

## 2020-02-03 NOTE — Progress Notes (Addendum)
[name], [name] ([id number]) Visit Report for 02/03/2020 Chief Complaint Document Details Patient Name: [name]. Date of Service: 02/03/2020 9:15 AM Medical Record Number: [id number] Patient Account Number: [id number] Date of Birth/Sex: 1965/12/20 (55 y.o. M) Treating RN: Dolan Amen Primary Care Provider: Berneta Sages Other Clinician: Referring Provider: Berneta Sages Treating Provider/Extender: Skipper Cliche in Treatment: 3 Information Obtained from: Patient Chief Complaint Right lateral foot ulcer Electronic Signature(s) Signed: 02/03/2020 9:29:44 AM By: [name] PA-C Entered By: [name] on 02/03/2020 09:29:44 [name] ([id number]) -------------------------------------------------------------------------------- Debridement Details Patient Name: [name] Date of Service: 02/03/2020 9:15 AM Medical Record Number: [id number] Patient Account Number: [id number] Date of Birth/Sex: [birth year] (55 y.o. M) Treating RN: Dolan Amen Primary Care Provider: Berneta Sages Other Clinician: Referring Provider: Berneta Sages Treating Provider/Extender: Skipper Cliche in Treatment: 46 Debridement Performed for Wound #4R Right,Lateral Foot Assessment: Performed By: Physician Tommie Sams., PA-C Debridement Type: Debridement Severity of Tissue Pre Debridement: Fat layer exposed Level of Consciousness (Pre- Awake and Alert procedure): Pre-procedure Verification/Time Out Yes - 09:42 Taken: Start Time: 09:42 Total Area Debrided (L x W): 0.4 (cm) x 0.5 (cm) = 0.2 (cm) Tissue and other material Viable, Non-Viable, Callus, Slough, Subcutaneous, Slough debrided: Level: Skin/Subcutaneous Tissue Debridement Description: Excisional Instrument: Curette Bleeding: Minimum Hemostasis Achieved: Pressure End Time: 09:44 Response to Treatment: Procedure was tolerated well Level of Consciousness (Post- Awake and Alert procedure): Post  Debridement Measurements of Total Wound Length: (cm) 0.4 Width: (cm) 0.5 Depth: (cm) 0.5 Volume: (cm) 0.079 Character of Wound/Ulcer Post Debridement: Stable Severity of Tissue Post Debridement: Fat layer exposed Post Procedure Diagnosis Same as Pre-procedure Electronic Signature(s) Signed: 02/03/2020 11:59:19 AM By: Charlett Nose RN Signed: 02/03/2020 9:50:44 PM By: [name] PA-C Entered By: Georges Mouse, Minus Breeding on 02/03/2020 09:44:49 [name] ([id number]) -------------------------------------------------------------------------------- HPI Details Patient Name: [name] Date of Service: 02/03/2020 9:15 AM Medical Record Number: [id number] Patient Account Number: [id number] Date of Birth/Sex: 1965-07-26 (55 y.o. M) Treating RN: Dolan Amen Primary Care Provider: Berneta Sages Other Clinician: Referring Provider: Berneta Sages Treating Provider/Extender: Skipper Cliche in Treatment: 24 History of Present Illness HPI Description: This 55 year old male comes with an ulcerated area on the plantar aspect of the right foot which she's had for approximately a month. I have known him from a previous visit at [facility] wound center and was treated in the months of April and May 2016 and rapidly healed a left plantar ulcer with a total contact cast. He has been a diabetic for about 16 years and tries to keep active and is fairly compliant with his diabetes management. 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 Past medical history significant for hypertension, hyperlipidemia, and status post appendectomy 1993. He does not smoke or drink alcohol. 01/14/2015 -- the patient had tolerated his total contact cast very well and had no problems and has had no systemic symptoms. However when his total contact cast was cut open he had excessive amount of purulent drainage in spite of being on antibiotics. He had had a recent x-ray done in the ER 12/22/2014 which  showed IMPRESSION:No evidence of osseous erosion. Known soft tissue ulceration is not well characterized on radiograph. Scattered vascular calcifications seen. his last hemoglobin A1c in December was 7.3. He has been on Augmentin and doxycycline for the last 2 weeks. 01/21/2015 -- his culture grew rare growth of Pantoea species an MR moderate growth of Candida parapsilosis. it is sensitive to levofloxacin.  He has not heard back from the insurance company regarding his hyperbaric oxygen therapy. His MRI has not been done yet and we will try and get him an earlier date 01/28/2015 -- MRI was done last night -- IMPRESSION:1. Soft tissue ulcer overlying the plantar aspect of the fifth metatarsal head extending to the cortex. Subcortical marrow edema in the fifth metatarsal head with corresponding T1 hypointensity is concerning for early osteomyelitis of the plantar lateral aspect of the fifth metatarsal head. Chest x-ray done on 01/14/2015 shows bronchiectatic changes without infiltrate. EKG done on generally 17 2017 shows a normal sinus rhythm and is a normal EKG. 02/04/2015 -- he was asked to see Dr. [name] last week and had 2 appointments but had to cancel both due to pressures of work. Last night he has woken up with severe pain in the foot and leg and it is swollen up. No fever or no change in his blood glucose. Addendum: I spoke with Dr. [name] who kindly agreed to accept the patient for inpatient therapy and have also opened to the hospitalist Dr. [name], and discuss details of the management including PICC line and repeat cultures. 02/12/2015-- -- was seen by Dr. [name] in the hospital and a PICC line was placed. He was to receive Ceftazidime 2 g every 12 hourly, oral levofloxacin 750 mg every 24 hourly and oral fluconazole 200 mg daily. The antibiotics were to be given for 4 weeks except the Diflucan was to be given for the first 2 weeks. Reviewed note from 02/10/2015 -- and Dr.  [name] had recommended management for growth of MSSA and Serratia. He switched him from ceftazidime to ceftriaxone 2 g every 24 hours. Levofloxacin was stopped and he would continue on fluconazole for another week. He had asked me to decide whether further imaging was necessary and whether surgical debridement of the infected bone was needed. He is doing well and has been off work for this week and we will keep him off the next week. 02/22/2015 -- he was seen by my colleague on 01/19/2015 and at that time an incision and drainage was done on his right lateral forefoot on the dorsum. Today when I probed this wound it is frankly draining pus and it communicates with the ulcer on the plantar aspect of his right foot. The patient is still on IV ceftriaxone 2 g every 24 hours and is to be seen by Dr. [name] on Friday. 03/19/2015 -- On 03/04/2015 I spoke to Dr. 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 He recommended operative debridement which would probably result in the fifth metatarsal head and toe amputation.The patient would be referred back to [place] once he was done with surgery. He was admitted to [facility] yesterday and had surgery done by podiatry for a right fifth metatarsal acute osteomyelitis with cellulitis and abscess. He had a right foot incision and drainage with fifth metatarsal partial amputation and removal of toe infected bone and soft tissue with cultures. The wound was partially closed and packing of the distal end was done. Patient was already on cefepime 2 g IV every 8 hourly and put on vancomycin pending final cultures. He had grown moderate gram-negative rods, and later found to be rare diphtheroids. I received a call from Dr. Cannon Kettle the podiatrist and we discussed the  above. On 03/10/2015 he was found  positive for influenza a and has been put on Tamiflu. Since his discharge he has been seen by Dr. Cannon Kettle who is planning to remove his sutures this coming week. He was reviewed by Dr. [name] on 03/17/2015 and his Diflucan was stopped and vancomycin. After the 3 doses he is taking. He is going to change Ceftazidime to Zosyn 3.375 g IV every 8 hours. 03/25/2015 -- he was seen by the podiatrist a couple of days ago and the sutures were removed. She will follow back with him in 4 weeks' time and at that time x-rays will be taken and a custom molded insert would be made for his shoe. 04/01/2015 -- he was seen by Dr. [name] on 03/29/2015 who pulled the PICC line stopped his IV antibiotics and recommended starting doxycycline and levofloxacin for 2 weeks. He also stop the fluconazole. The patient will follow up with him only when necessary. Readmission: 03/15/17 on evaluation today patient presents for initial evaluation concerning the new issues although he has previously been evaluated in our clinic. Unfortunately the previous evaluation led to the patient having to proceed to amputation and so he is somewhat nervous about being here [name], [name] ([id number]) today. With that being said he has a very slight blister that occurred on the plantar aspect more medial on the right great toe that has been present for just a very short amount of time, several days. With that being said he felt like initially when he called that he somewhat overreacted but due to the fact that his previous issue led to amputation he is very cautious these days I explained that he did the right thing. With that being said he has been tolerating the dressing changes without complication mainly he just been covering this. He does not have any discomfort in secondary to neuropathy it's unlikely that he feels much. With that being said he does tell me that the issue that he had here is that he went barefoot when he  knows he should not have which subsequently led to the blisters. He states is definitely not doing that anymore. His most recent hemoglobin A1c was December and registered 6.9 his ABI today was 1.1. 03/27/17 on evaluation today patient appears to be doing great in regard to his right great toe ulcer. He has been tolerating the dressing changes without complication. The good news is this is making excellent progress and he seems to be caring for this in an excellent fashion. I see no evidence of breakdown that would make me concerned that he was at risk for infection/amputation. This has obviously been his concern due to the fifth toe amputation that was necessitated previous when he had a similar issue. Nonetheless this seems to be progressing much more nicely. Readmission: 03/13/2019 upon evaluation today patient presents for reevaluation here in the clinic concerning issues he has been having with his right foot on the lateral portion at the proximal end of the metatarsal. Subsequently he tells me that this just opened up in the past couple of days or so and the erythema really began about 24 to 48 hours ago. Fortunately there is no signs of systemic infection. 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 That left him with this bony prominence that is where the wound is currently. There is erythema surrounding that does have me concerned about cellulitis. With that being said he was noncompressible as far  as ABIs are concerned I do think we can need to check into arterial studies at this point. Patient does have a history of hypertension along with diabetes mellitus type 2. He also tends to develop quite a bit of callus white often. 03/20/2019 upon evaluation today patient appears to be doing very well in regard to his wounds currently. He has been tolerating the dressing changes without complication. Fortunately there is no signs of infection. His ABIs  were good at this point and registered at 1.07 on the left and 1.09 on the right. In regard to the x-ray this was negative as well for any signs of osteomyelitis. The patient also seems to be doing better in regard to the infection. He still has several days of the antibiotic left at this point best the Bactrim DS and he seems to be doing very well with this. Overall I am extremely pleased with the progress in 1 week's time he is going require some sharp debridement today however. 03/27/2019 upon evaluation today patient actually appears to be making some progress in regard to his wound. It is a little deeper but measuring smaller which is good news due to the fact that again were clear away some of the necrotic tissue which is why the depth is increasing a little bit. Nonetheless after like were getting very close to being down at a good wound bed. Fortunately there is no signs of active infection at this time. There is a little bit of erythema immediately surrounding the wound that we do want to be very cognizant of and careful about. For that reason I am going to go ahead and extend his antibiotic today for an additional 10 days that is the Bactrim. 04/03/2019 upon evaluation today patient appears to be making some progress. I do feel like the Annitta Needs is doing a good job along with the alginate is being packed into the wound space behind. He has been tolerating the dressing changes without complication. Fortunately there is no signs of active infection at this time. No fevers, chills, nausea, vomiting, or diarrhea. 04/10/2019 upon evaluation today patient appears to be making progress. The wound is not quite as deep but he still does not have a great wound surface yet for working on this and the Santyl does seem to be cleaning things up. Fortunately there is no evidence of active infection at this time. No fevers, chills, nausea, vomiting, or diarrhea. 04/17/2019 upon evaluation today patient appears to  be doing excellent at this time in regard to his foot ulcer. I do believe the Santyl with the alginate packed in behind has been of benefit for him and I am very pleased in this regard. With that being said I am feeling like the wound surface is improving quite a bit each time I see him still I do not believe we are at the point of stating that the wound bed is perfect but again were making progress. The patient is going require some sharp debridement today. 4/22; this is a patient who is using Santyl with calcium alginate. Apparently his wound dimensions have been improving. Patient is changing the dressing himself. He has a surgical shoe. He works in a sitting position therefore is not on his feet all that much. There is undermining 05/01/2019 upon evaluation today patient appears to be doing a little better in regard to the size of his wound though he still has some depth. This does seem to be much cleaner has been using [employer]  on the base of the wound followed by packing with silver alginate and behind. Nonetheless I do believe that based on what we are seeing we may need to switch to a collagen type dressing endoform may actually be excellent for him. 05/08/2019 upon evaluation today patient's wound actually appears to be showing better granulation tissue in the base of the wound. With that being said he does have some epiboly noted around the edges of the wound especially plantar and more distal. Subsequently this is can require sharp debridement today. Fortunately there is no signs of active systemic infection though I do feel like there is some local cellulitis noted at this point. The patient also notes has had increased drainage. 5/13; wound volume quite a bit improved this week down to 3 mm. Patient states pain and drainage better. Culture from last week grew staph aureus [not MRSA] and this is sensitive to quinolones and he is on Levaquin. HOWEVER he also grew abundant Enterococcus faecalis  treatment of choice for this is ampicillin. Quinolone coverage is unreliable 5/20 completing the Augmentin. Small wound on the right lateral foot. Thick rolled edges around the wound. Using silver alginate 05/29/2019 upon evaluation today patient appears to be doing better compared to last time I saw him. Fortunately there is no signs of active infection and I do feel like he is actually making good progress. Overall the quality of the wound bed along with the size looks improved and the inflammation/erythema that was previously noted by myself when I saw the couple weeks back actually has also resolved and this is great. Overall very pleased with how things stand. 06/06/2019 upon evaluation today patient actually appears to be doing quite well with regard to his foot ulcer with regard to some of the granulation were seen at this point. There does not appear to be any evidence of systemic or local infection although he still has discomfort I feel like things are moving in a slow but surely correct direction. I think we may need to help fill some of the space however even though he is using the collagen we may need to have something behind this such as a plain packing strip to try to help hold the collagen in the base of the wound. 06/12/2019 upon evaluation today patient appears to be doing well with regard to his foot ulcer. This seems to be making some slow but steady progress. The wound does not appear to be as deep today compared to where it was previous. Fortunately there is no signs of active infection at this time. No fevers, chills, nausea, vomiting, or diarrhea. 06/19/2019 on evaluation today patient's wound does not appear to be doing quite as well as what I would like to see. Fortunately there is no signs of systemic infection at this time which is great news. With that being said I do believe that the patient needs to have a wound culture as I do [name], [name]. ([id number]) believe he  likely has a local infection that needs to be addressed. He is also can require some debridement today as well. 06/26/2019 upon evaluation today patient's wound actually appears to be showing signs of excellent improvement today. There does not appear to be any evidence of active infection which is great news. There does not appear to be significant erythema at this point. The patient does have improvement overall in the appearance of the infection he has been taking the Augmentin the culture came back showing no organism was grown  at this point but nonetheless I do believe he has been doing well with the antibiotics I would continue that at this point. 07/03/2019 upon evaluation today patient appears to be doing about the same in regard to his wound. There does not appear to be any evidence of active infection and overall I am pleased with where things stand. The patient is tolerating the dressing changes without complication but it does not appear that they are packing the wound at this time which I think is going to slow down his healing prospects. 07/10/2019 upon evaluation today patient appears to be doing little better in my opinion in regard to the overall moisture collected in the base of the wound. They have been packing this with a little bit of silver alginate and that has done quite well. Overall I am pleased in that regard. Subsequently the redness does not appear to be any worse today I did actually go ahead and marked this since this can be 2 weeks between now and when I see him again I would make sure that if anything changes or worsens he will be able to notice this and go ahead and initiate treatment with the Augmentin. 07/22/2019 patient comes in today a little bit early for evaluation secondary to the fact that he was having issues with increased drainage and concern about infection. With that being said he does appear to have developed a blister area again proximal to where the original  wound opening was in although the wound bed itself actually appears to be doing somewhat better he has had increased drainage and tracking of fluid here due to this blister. Nonetheless I believe that that does need to be cleaned away today but again I am not really certain that I see a lot of evidence for infection at this point. 07/29/2019 on evaluation today patient actually appears to be doing better in regard to his foot ulcer. There is improvement compared to last week which is great news. Nonetheless he does come prepared to apply the total contact cast today which I think would be beneficial for him. He knows he is not supposed to drive he did bring his son with him today in order to drive him. 07/31/2019 upon evaluation today patient appears to be doing well with regard to his wound today. I do not see any signs of infection I do believe that he is definitely showing some signs of improvement here in the office in a couple days as far as the overall erythema and pressure getting to the wound area is concerned. With that being said I do believe that the cast has been of great benefit for him. 08/08/2019 upon evaluation today patient appears to be doing better in regard to his wound. Overall I feel like he is showing signs of improvement. The external measurement is really not bad at all although I did remove some of the callus in order to open up the wound itself the depth is actually coming quite well and I am extremely pleased in that regard I do not think we can to see much improvement externally until we get the internal depth filled in and that again is improved. Fortunately there is no signs of active infection at this time. No fevers, chills, nausea, vomiting, or diarrhea. 08/15/2019 on evaluation today patient actually appears to be doing well with regard to his foot ulcer. This is showing some signs of minimal improvement from the standpoint of the depth and appearance of the  wound as  well as the edges of the wound. It is taking its time and to be honest I do believe that he seems to be progressing well. I am happy with the cast and what it is achieving at this point. Have contemplated checking into Dermagraft for Apligraf for the patient but again he does seem to be making fairly good progress in my opinion. If it has not tremendously changed come next week I may consider one of the 2. 08/22/2019 on evaluation today patient appears to be making good progress with regard to his wound I am still very pleased with where things stand today. Overall I think that he is making good improvements week by week and I believe that we are getting continue with the Total contact cast I will likely switch to a silver alginate dressing. 08/29/2019 on evaluation today patient appears to be doing quite well with regard to his wound. Fortunately this is measuring quite small. There is no signs of active infection at this time although he did have a little bit of a callus area just to the side of his foot where we previously noted some fluid collecting underneath that was the case today as well. I did have to remove this. With that being said overall he seems to be doing quite well. 09/05/19 on evaluation today patient appears to be doing very well with regard to his original wound which is actually very small on evaluation today. With that being said he does have a blistered area that's more proximal to where this wound is that has been a little bit more concerned. Fortunately there is no signs of active infection at this time. I'm going to need to remove the blister tissue today. 10 09/12/2019 on evaluation today patient actually appears to be doing much better in regard to his wound. I think he is actually improved more without the cast that he was prior with the cast. Overall very pleased with where things stand today. 09/22/2019 upon evaluation today patient appears to be doing very well at this time  in regard to his foot ulcer. In fact I feel like he is continue to make good progress. I see no signs right now that there is any active infection which is great news. Also see very little evidence of pressure he does have some callus buildup but again this seems to be minimal. 09/29/2019 upon evaluation today patient appears to be doing well with regard to his foot ulcer. He has been tolerating the dressing changes without complication. Fortunately there is no signs of active infection at this time. No fevers, chills, nausea, vomiting, or diarrhea. 10/28/2019 upon evaluation today patient appears to be doing a little bit more poorly than last time I saw him as last time we assumed he was completely healed. Apparently that was not the case or else this is reopened but I tend to believe that this probably has never completely closed internally though it appeared to be. He has been using the offloading felt which seems to have done well there is no signs of redness no evidence of erythema all of which is good news. Nonetheless I am concerned about the fact that he is having issues here with what appears to be a reopening of the wound. 11/04/2019 upon evaluation today patient's wound actually showed signs of fairly good improvement today which is great news the wound bed does not appear to be significantly callus covered and also does not appear to show any  signs of significant infection or really any infection whatsoever. I am very pleased in this regard. In general I think that he is making good progress although this is a lot slower than he would like to see I think we are headed back in the correct direction. He is continuing to use the offloading felt. 11/18/2019 on evaluation today patient appears to be having some callus buildup and cracking that occurred at the wound site. Subsequently actually did have to perform a fairly significant debridement on the area to clean away some of the callus buildup  he tolerated that today with some bleeding with that being said I do feel like the wound appear to be doing much better post debridement. [name], [name] ([id number]) 11/25/2019 upon evaluation today patient appears to be doing somewhat poorly in regard to his foot ulcer. Unfortunately he is having some issues with cellulitis along the lateral aspect of his foot. Unfortunately he seems to be having some issues today with infection/cellulitis in regard to the foot. It still towards the proximal/dorsal surface of where the wound is and this subsequently is the area that always seems to cause him trouble. 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 I do believe we may need to look into this further to ensure that there is nothing more significant going on considering that he seems to have an ongoing issue with this at this point. 12/02/2019 upon evaluation today patient appears to be doing better in regard to his foot ulcer. This is great news. Fortunately there is no signs of active infection systemically which is also excellent. He has been tolerating the dressing changes without complication. With that being said he does tell me that the Augmentin has been causing him some trouble as far as headaches are concerned since he is been taking it. Is unsure if it is directly related or not. Nonetheless as I explained to him it is possible that this could be something that he is experiencing as a result of the antibiotic. The good news is there are other options that we could switch him to that would work equally well while at the same time seeing if indeed the antibiotic was causing the headaches. He does want to proceed with that. 12/09/2019 on evaluation today patient's wound actually showing some signs of improvement. Fortunately there is no signs of active infection at this time great news. No fever chills noted. He has been using the alginate which I think has done well for him up to this point.  No fevers, chills, nausea, vomiting, or diarrhea. 12/18/2019 on evaluation today patient's wound is actually showing signs of fairly good granulation at this point and epithelization. He does have some callus still covering over the little tunnel off to the side that still seems to be staying open despite the fact that we have been trying to keep the callus away from this is much as possible. Fortunately there is no signs of active infection systemically at this time overall I feel like the patient is making good progress in that regard. There is no erythema whatsoever noted. With that being said were still trying to check into MRI in where we stand in that regard. Apparently has been approved but not scheduled. 12/26/2019 upon evaluation today patient appears to be doing decently well in regard to his wound. He does have some callus buildup beginning to clear this away. Fortunately there is no signs of active infection at this time. No fevers, chills, nausea, vomiting,  or diarrhea. 01/05/2020 upon evaluation today patient appears to be doing much better in regard to his foot ulcer. 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 It also did show that he had no evidence of osteomyelitis of the forefoot which was excellent. 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 Overall the fact that he does not have a bone infection is the ideal thing here. 11/13/2019 upon evaluation today patient appears to be doing about the same in regards to his foot ulcer. Unfortunately he has not shown signs of significant improvement at this time which is unfortunate. I feel like that the area where the remaining metatarsal bone is where he had the fifth ray amputation is actually a significant pressure point this causing some issues here for him.  I may want him to have him go back to the podiatrist to see if there is anything that he could have done to try to clear this up a bit. I do not know if he needs any additional surgery to possibly trim down the bone area or if potentially a better offloading shoe may be the better way to go. I do want to see about their expertise in this regard. 01/27/2020 upon evaluation today patient appears to be doing excellent in regard to his wound currently. There is no evidence of active infection which is great news and I am very pleased in that regard. With that being said I do see evidence currently that the patient is still developing a significant amount of callus over the area and again I been try to keep this pared back that has made a big difference in preventing infection and again today he does not appear to be infected. With that being said I do believe that the patient is overall doing well he did see Dr. Amalia Hailey I still do not see that note for review in epic but nonetheless Dr. Amalia Hailey has recommended that based on the shape of the bone and the way it healed that it does need to be shaved down in order to relieve some of the pressure that the patient has been experiencing. For that reason he is going to be undergoing surgery on February 10 in order to try to correct this. 02/03/2020 upon evaluation today patient actually appears to be doing excellent in regard to his wound. There is no signs of infection though it is also not significantly smaller today compared to previous. He does have his surgery scheduled with Dr. Amalia Hailey next Thursday which is only 9 days away. Electronic Signature(s) Signed: 02/03/2020 10:00:21 AM By: [name] PA-C Entered By: [name] on 02/03/2020 10:00:21 [name] ([id number]) -------------------------------------------------------------------------------- Physical Exam Details Patient Name: [name], [name] Date of Service: 02/03/2020 9:15  AM Medical Record Number: [id number] Patient Account Number: [id number] Date of Birth/Sex: [birth year] (55 y.o. M) Treating RN: Dolan Amen Primary Care Provider: Berneta Sages Other Clinician: Referring Provider: Berneta Sages Treating Provider/Extender: Skipper Cliche in Treatment: 44 Constitutional Well-nourished and well-hydrated in no acute distress. Respiratory normal breathing without difficulty. Psychiatric this patient is able to make decisions and demonstrates good insight into disease process. Alert and Oriented x 3. pleasant and cooperative. Notes Upon inspection patient's wound bed did require minimal sharp debridement to remove some slough and biofilm from the  surface of the wound as well as callus around the edges of the wound. He tolerated all this today without complication post debridement wound bed appears to be doing much better. With that being said I do believe that shaving down the bone so does not protrude as extensively will be beneficial for him that is next Thursday with Dr. Amalia Hailey. Electronic Signature(s) Signed: 02/03/2020 10:00:54 AM By: [name] PA-C Entered By: [name] on 02/03/2020 10:00:54 [name] ([id number]) -------------------------------------------------------------------------------- Physician Orders Details Patient Name: [name] Date of Service: 02/03/2020 9:15 AM Medical Record Number: [id number] Patient Account Number: [id number] Date of Birth/Sex: 1965/02/10 (55 y.o. 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 o Nurse Visit as needed Off-Loading Wound #4R Right,Lateral Foot o Open toe surgical shoe o Offloading felt to foot. Wound Treatment Wound #4R - Foot Wound Laterality: Right, Lateral Cleanser: Byram Ancillary Kit - 15 Day Supply 3 x Per Week/30 Days Discharge Instructions: Use supplies as instructed; Kit contains: (15) Saline Bullets; (15) 3x3 Gauze; 15 pr Gloves Cleanser: Normal Saline 3 x Per Week/30 Days Discharge Instructions: Wash your hands with soap and water. Remove old dressing, discard into plastic bag and place into trash. Cleanse the wound with Normal Saline prior to applying a clean dressing using gauze sponges, not tissues or cotton balls. Do not scrub or use excessive force. Pat dry using gauze sponges, not tissue or cotton balls. Primary Dressing: Endoform 2x2 (in/in) 3 x Per Week/30 Days Discharge Instructions: Apply Endoform as directed Secondary Dressing: Gauze 3 x Per Week/30 Days Discharge Instructions: As directed: dry, moistened with saline or moistened with Dakins Solution Secured With: Conforming Stretch Gauze Bandage 4x75 (in/in) 3 x Per Week/30 Days Discharge Instructions: Apply as directed Electronic Signature(s) Signed: 02/03/2020 11:59:19 AM By: Georges Mouse, Minus Breeding RN Signed: 02/03/2020 9:50:44 PM By: [name] PA-C Entered By: Georges Mouse, Minus Breeding on 02/03/2020 09:45:30 [name] ([id number]) -------------------------------------------------------------------------------- Problem List Details Patient Name: [name] Date of Service: 02/03/2020 9:15 AM Medical Record Number: [id number] Patient Account Number: [id number] Date of Birth/Sex: 07-30-1965 (55 y.o. 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 M) Treating RN: Dolan Amen Primary Care  Provider: Berneta Sages Other Clinician: Referring Provider: Berneta Sages Treating Provider/Extender: Skipper Cliche in Treatment: 15 Subjective Chief Complaint Information obtained from Patient Right lateral foot ulcer History of Present Illness (HPI) This 55 year old male comes with an ulcerated area on the plantar aspect of the right foot which she's had for approximately a month. I have known him from a previous visit at [facility] wound center and was treated in the months of April and May 2016 and rapidly healed a left plantar ulcer with a total contact cast. He has been a diabetic for about 16 years and tries to keep active and is fairly compliant with his diabetes management. 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 Past medical history significant for hypertension, hyperlipidemia, and status post appendectomy 1993. He does not smoke or  drink alcohol. 01/14/2015 -- the patient had tolerated his total contact cast very well and had no problems and has had no systemic symptoms. However when his total contact cast was cut open he had excessive amount of purulent drainage in spite of being on antibiotics. He had had a recent x-ray done in the ER 12/22/2014 which showed IMPRESSION:No evidence of osseous erosion. Known soft tissue ulceration is not well characterized on radiograph. Scattered vascular calcifications seen. his last hemoglobin A1c in December was 7.3. He has been on Augmentin and doxycycline for the last 2 weeks. 01/21/2015 -- his culture grew rare growth of Pantoea species an MR moderate growth of Candida parapsilosis. it is sensitive to levofloxacin. He has not heard back from the insurance company regarding his hyperbaric oxygen therapy. His MRI has not been done yet and we will try and get him an earlier date 01/28/2015 -- MRI was done last night -- IMPRESSION:1. Soft tissue ulcer overlying the plantar aspect of the fifth metatarsal head extending to the cortex. Subcortical marrow edema in the fifth metatarsal head with corresponding T1 hypointensity is concerning for early osteomyelitis of the plantar lateral aspect of the fifth metatarsal head. Chest x-ray done on 01/14/2015 shows bronchiectatic changes without infiltrate. EKG done on generally 17 2017 shows a normal sinus rhythm and is a normal EKG. 02/04/2015 -- he was asked to see Dr. [name] last week and had 2 appointments but had to cancel both due to pressures of work. Last night he has woken up with severe pain in the foot and leg and it is swollen up. No fever or no change in his blood glucose. Addendum: I spoke with Dr. [name] who kindly agreed to accept the patient for inpatient therapy and have also opened to the hospitalist Dr. [name], and discuss details of the management including PICC line and repeat cultures. 02/12/2015-- -- was seen by Dr.  [name] in the hospital and a PICC line was placed. He was to receive Ceftazidime 2 g every 12 hourly, oral levofloxacin 750 mg every 24 hourly and oral fluconazole 200 mg daily. The antibiotics were to be given for 4 weeks except the Diflucan was to be given for the first 2 weeks. Reviewed note from 02/10/2015 -- and Dr. [name] had recommended management for growth of MSSA and Serratia. He switched him from ceftazidime to ceftriaxone 2 g every 24 hours. Levofloxacin was stopped and he would continue on fluconazole for another week. He had asked me to decide whether further imaging was necessary and whether surgical debridement of the infected bone was needed. He is doing well and has been off work for this week and we will keep him off the next  week. 02/22/2015 -- he was seen by my colleague on 01/19/2015 and at that time an incision and drainage was done on his right lateral forefoot on the dorsum. Today when I probed this wound it is frankly draining pus and it communicates with the ulcer on the plantar aspect of his right foot. The patient is still on IV ceftriaxone 2 g every 24 hours and is to be seen by Dr. [name] on Friday. 03/19/2015 -- On 03/04/2015 I spoke to Dr. 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 He recommended operative debridement which would probably result in the fifth metatarsal head and toe amputation.The patient would be referred back to [place] once he was done with surgery. He was admitted to [facility] yesterday and had surgery done by podiatry for a right fifth metatarsal acute osteomyelitis with cellulitis and abscess. He had a right foot incision and drainage with fifth metatarsal partial amputation and removal of toe infected bone and soft tissue with cultures. The wound was partially closed and  packing of the distal end was done. Patient was already on cefepime 2 g IV every 8 hourly and put on vancomycin pending final cultures. He had grown moderate gram-negative rods, and later found to be rare diphtheroids. I received a call from Dr. Cannon Kettle the podiatrist and we discussed the above. On 03/10/2015 he was found positive for influenza a and has been put on Tamiflu. Since his discharge he has been seen by Dr. Cannon Kettle who is planning to remove his sutures this coming week. He was reviewed by Dr. [name] on 03/17/2015 and his Diflucan was stopped and vancomycin. After the 3 doses he is taking. He is going to change Ceftazidime to Zosyn 3.375 g IV every 8 hours. 03/25/2015 -- he was seen by the podiatrist a couple of days ago and the sutures were removed. She will follow back with him in 4 weeks' time and at that time x-rays will be taken and a custom molded insert would be made for his shoe. 04/01/2015 -- he was seen by Dr. [name] on 03/29/2015 who pulled the PICC line stopped his IV antibiotics and recommended starting doxycycline and levofloxacin for 2 weeks. He also stop the fluconazole. The patient will follow up with him only when necessary. [name], [name] ([id number]) Readmission: 03/15/17 on evaluation today patient presents for initial evaluation concerning the new issues although he has previously been evaluated in our clinic. Unfortunately the previous evaluation led to the patient having to proceed to amputation and so he is somewhat nervous about being here today. With that being said he has a very slight blister that occurred on the plantar aspect more medial on the right great toe that has been present for just a very short amount of time, several days. With that being said he felt like initially when he called that he somewhat overreacted but due to the fact that his previous issue led to amputation he is very cautious these days I explained that he did the right  thing. With that being said he has been tolerating the dressing changes without complication mainly he just been covering this. He does not have any discomfort in secondary to neuropathy it's unlikely that he feels much. With that being said he does tell me that the issue that he had here  is that he went barefoot when he knows he should not have which subsequently led to the blisters. He states is definitely not doing that anymore. His most recent hemoglobin A1c was December and registered 6.9 his ABI today was 1.1. 03/27/17 on evaluation today patient appears to be doing great in regard to his right great toe ulcer. He has been tolerating the dressing changes without complication. The good news is this is making excellent progress and he seems to be caring for this in an excellent fashion. I see no evidence of breakdown that would make me concerned that he was at risk for infection/amputation. This has obviously been his concern due to the fifth toe amputation that was necessitated previous when he had a similar issue. Nonetheless this seems to be progressing much more nicely. Readmission: 03/13/2019 upon evaluation today patient presents for reevaluation here in the clinic concerning issues he has been having with his right foot on the lateral portion at the proximal end of the metatarsal. Subsequently he tells me that this just opened up in the past couple of days or so and the erythema really began about 24 to 48 hours ago. Fortunately there is no signs of systemic infection. 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 That left him with this bony prominence that is where the wound is currently. There is erythema surrounding that does have me concerned about cellulitis. With that being said he was noncompressible as far as ABIs are concerned I do think we can need to check into arterial studies at this point. Patient does have a history of hypertension  along with diabetes mellitus type 2. He also tends to develop quite a bit of callus white often. 03/20/2019 upon evaluation today patient appears to be doing very well in regard to his wounds currently. He has been tolerating the dressing changes without complication. Fortunately there is no signs of infection. His ABIs were good at this point and registered at 1.07 on the left and 1.09 on the right. In regard to the x-ray this was negative as well for any signs of osteomyelitis. The patient also seems to be doing better in regard to the infection. He still has several days of the antibiotic left at this point best the Bactrim DS and he seems to be doing very well with this. Overall I am extremely pleased with the progress in 1 week's time he is going require some sharp debridement today however. 03/27/2019 upon evaluation today patient actually appears to be making some progress in regard to his wound. It is a little deeper but measuring smaller which is good news due to the fact that again were clear away some of the necrotic tissue which is why the depth is increasing a little bit. Nonetheless after like were getting very close to being down at a good wound bed. Fortunately there is no signs of active infection at this time. There is a little bit of erythema immediately surrounding the wound that we do want to be very cognizant of and careful about. For that reason I am going to go ahead and extend his antibiotic today for an additional 10 days that is the Bactrim. 04/03/2019 upon evaluation today patient appears to be making some progress. I do feel like the Annitta Needs is doing a good job along with the alginate is being packed into the wound space behind. He has been tolerating the dressing changes without complication. Fortunately there is  no signs of active infection at this time. No fevers, chills, nausea, vomiting, or diarrhea. 04/10/2019 upon evaluation today patient appears to be making progress. The  wound is not quite as deep but he still does not have a great wound surface yet for working on this and the Santyl does seem to be cleaning things up. Fortunately there is no evidence of active infection at this time. No fevers, chills, nausea, vomiting, or diarrhea. 04/17/2019 upon evaluation today patient appears to be doing excellent at this time in regard to his foot ulcer. I do believe the Santyl with the alginate packed in behind has been of benefit for him and I am very pleased in this regard. With that being said I am feeling like the wound surface is improving quite a bit each time I see him still I do not believe we are at the point of stating that the wound bed is perfect but again were making progress. The patient is going require some sharp debridement today. 4/22; this is a patient who is using Santyl with calcium alginate. Apparently his wound dimensions have been improving. Patient is changing the dressing himself. He has a surgical shoe. He works in a sitting position therefore is not on his feet all that much. There is undermining 05/01/2019 upon evaluation today patient appears to be doing a little better in regard to the size of his wound though he still has some depth. This does seem to be much cleaner has been using Santyl on the base of the wound followed by packing with silver alginate and behind. Nonetheless I do believe that based on what we are seeing we may need to switch to a collagen type dressing endoform may actually be excellent for him. 05/08/2019 upon evaluation today patient's wound actually appears to be showing better granulation tissue in the base of the wound. With that being said he does have some epiboly noted around the edges of the wound especially plantar and more distal. Subsequently this is can require sharp debridement today. Fortunately there is no signs of active systemic infection though I do feel like there is some local cellulitis noted at this point.  The patient also notes has had increased drainage. 5/13; wound volume quite a bit improved this week down to 3 mm. Patient states pain and drainage better. Culture from last week grew staph aureus [not MRSA] and this is sensitive to quinolones and he is on Levaquin. HOWEVER he also grew abundant Enterococcus faecalis treatment of choice for this is ampicillin. Quinolone coverage is unreliable 5/20 completing the Augmentin. Small wound on the right lateral foot. Thick rolled edges around the wound. Using silver alginate 05/29/2019 upon evaluation today patient appears to be doing better compared to last time I saw him. Fortunately there is no signs of active infection and I do feel like he is actually making good progress. Overall the quality of the wound bed along with the size looks improved and the inflammation/erythema that was previously noted by myself when I saw the couple weeks back actually has also resolved and this is great. Overall very pleased with how things stand. 06/06/2019 upon evaluation today patient actually appears to be doing quite well with regard to his foot ulcer with regard to some of the granulation were seen at this point. There does not appear to be any evidence of systemic or local infection although he still has discomfort I feel like things are moving in a slow but surely correct direction. I  think we may need to help fill some of the space however even though he is using the collagen we may need to have something behind this such as a plain packing strip to try to help hold the collagen in the base of the wound. 06/12/2019 upon evaluation today patient appears to be doing well with regard to his foot ulcer. This seems to be making some slow but steady progress. The wound does not appear to be as deep today compared to where it was previous. Fortunately there is no signs of active infection at [name], [name]. ([id number]) this time. No fevers, chills, nausea, vomiting,  or diarrhea. 06/19/2019 on evaluation today patient's wound does not appear to be doing quite as well as what I would like to see. Fortunately there is no signs of systemic infection at this time which is great news. With that being said I do believe that the patient needs to have a wound culture as I do believe he likely has a local infection that needs to be addressed. He is also can require some debridement today as well. 06/26/2019 upon evaluation today patient's wound actually appears to be showing signs of excellent improvement today. There does not appear to be any evidence of active infection which is great news. There does not appear to be significant erythema at this point. The patient does have improvement overall in the appearance of the infection he has been taking the Augmentin the culture came back showing no organism was grown at this point but nonetheless I do believe he has been doing well with the antibiotics I would continue that at this point. 07/03/2019 upon evaluation today patient appears to be doing about the same in regard to his wound. There does not appear to be any evidence of active infection and overall I am pleased with where things stand. The patient is tolerating the dressing changes without complication but it does not appear that they are packing the wound at this time which I think is going to slow down his healing prospects. 07/10/2019 upon evaluation today patient appears to be doing little better in my opinion in regard to the overall moisture collected in the base of the wound. They have been packing this with a little bit of silver alginate and that has done quite well. Overall I am pleased in that regard. Subsequently the redness does not appear to be any worse today I did actually go ahead and marked this since this can be 2 weeks between now and when I see him again I would make sure that if anything changes or worsens he will be able to notice this and go ahead  and initiate treatment with the Augmentin. 07/22/2019 patient comes in today a little bit early for evaluation secondary to the fact that he was having issues with increased drainage and concern about infection. With that being said he does appear to have developed a blister area again proximal to where the original wound opening was in although the wound bed itself actually appears to be doing somewhat better he has had increased drainage and tracking of fluid here due to this blister. Nonetheless I believe that that does need to be cleaned away today but again I am not really certain that I see a lot of evidence for infection at this point. 07/29/2019 on evaluation today patient actually appears to be doing better in regard to his foot ulcer. There is improvement compared to last week which is great news. Nonetheless  he does come prepared to apply the total contact cast today which I think would be beneficial for him. He knows he is not supposed to drive he did bring his son with him today in order to drive him. 07/31/2019 upon evaluation today patient appears to be doing well with regard to his wound today. I do not see any signs of infection I do believe that he is definitely showing some signs of improvement here in the office in a couple days as far as the overall erythema and pressure getting to the wound area is concerned. With that being said I do believe that the cast has been of great benefit for him. 08/08/2019 upon evaluation today patient appears to be doing better in regard to his wound. Overall I feel like he is showing signs of improvement. The external measurement is really not bad at all although I did remove some of the callus in order to open up the wound itself the depth is actually coming quite well and I am extremely pleased in that regard I do not think we can to see much improvement externally until we get the internal depth filled in and that again is improved. Fortunately there  is no signs of active infection at this time. No fevers, chills, nausea, vomiting, or diarrhea. 08/15/2019 on evaluation today patient actually appears to be doing well with regard to his foot ulcer. This is showing some signs of minimal improvement from the standpoint of the depth and appearance of the wound as well as the edges of the wound. It is taking its time and to be honest I do believe that he seems to be progressing well. I am happy with the cast and what it is achieving at this point. Have contemplated checking into Dermagraft for Apligraf for the patient but again he does seem to be making fairly good progress in my opinion. If it has not tremendously changed come next week I may consider one of the 2. 08/22/2019 on evaluation today patient appears to be making good progress with regard to his wound I am still very pleased with where things stand today. Overall I think that he is making good improvements week by week and I believe that we are getting continue with the Total contact cast I will likely switch to a silver alginate dressing. 08/29/2019 on evaluation today patient appears to be doing quite well with regard to his wound. Fortunately this is measuring quite small. There is no signs of active infection at this time although he did have a little bit of a callus area just to the side of his foot where we previously noted some fluid collecting underneath that was the case today as well. I did have to remove this. With that being said overall he seems to be doing quite well. 09/05/19 on evaluation today patient appears to be doing very well with regard to his original wound which is actually very small on evaluation today. With that being said he does have a blistered area that's more proximal to where this wound is that has been a little bit more concerned. Fortunately there is no signs of active infection at this time. I'm going to need to remove the blister tissue today. 10 09/12/2019  on evaluation today patient actually appears to be doing much better in regard to his wound. I think he is actually improved more without the cast that he was prior with the cast. Overall very pleased with where things stand today.  09/22/2019 upon evaluation today patient appears to be doing very well at this time in regard to his foot ulcer. In fact I feel like he is continue to make good progress. I see no signs right now that there is any active infection which is great news. Also see very little evidence of pressure he does have some callus buildup but again this seems to be minimal. 09/29/2019 upon evaluation today patient appears to be doing well with regard to his foot ulcer. He has been tolerating the dressing changes without complication. Fortunately there is no signs of active infection at this time. No fevers, chills, nausea, vomiting, or diarrhea. 10/28/2019 upon evaluation today patient appears to be doing a little bit more poorly than last time I saw him as last time we assumed he was completely healed. Apparently that was not the case or else this is reopened but I tend to believe that this probably has never completely closed internally though it appeared to be. He has been using the offloading felt which seems to have done well there is no signs of redness no evidence of erythema all of which is good news. Nonetheless I am concerned about the fact that he is having issues here with what appears to be a reopening of the wound. 11/04/2019 upon evaluation today patient's wound actually showed signs of fairly good improvement today which is great news the wound bed does not appear to be significantly callus covered and also does not appear to show any signs of significant infection or really any infection whatsoever. I am very pleased in this regard. In general I think that he is making good progress although this is a lot slower than he would like to see I think we are headed back in the  correct direction. He is continuing to use the offloading felt. [name], [name] ([id number]) 11/18/2019 on evaluation today patient appears to be having some callus buildup and cracking that occurred at the wound site. Subsequently actually did have to perform a fairly significant debridement on the area to clean away some of the callus buildup he tolerated that today with some bleeding with that being said I do feel like the wound appear to be doing much better post debridement. 11/25/2019 upon evaluation today patient appears to be doing somewhat poorly in regard to his foot ulcer. Unfortunately he is having some issues with cellulitis along the lateral aspect of his foot. Unfortunately he seems to be having some issues today with infection/cellulitis in regard to the foot. It still towards the proximal/dorsal surface of where the wound is and this subsequently is the area that always seems to cause him trouble. 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 I do believe we may need to look into this further to ensure that there is nothing more significant going on considering that he seems to have an ongoing issue with this at this point. 12/02/2019 upon evaluation today patient appears to be doing better in regard to his foot ulcer. This is great news. Fortunately there is no signs of active infection systemically which is also excellent. He has been tolerating the dressing changes without complication. With that being said he does tell me that the Augmentin has been causing him some trouble as far as headaches are concerned since he is been taking it. Is unsure if it is directly related or not. Nonetheless as I explained to him it is possible that this could be something that he is experiencing as a  result of the antibiotic. The good news is there are other options that we could switch him to that would work equally well while at the same time seeing if indeed the antibiotic was causing the  headaches. He does want to proceed with that. 12/09/2019 on evaluation today patient's wound actually showing some signs of improvement. Fortunately there is no signs of active infection at this time great news. No fever chills noted. He has been using the alginate which I think has done well for him up to this point. No fevers, chills, nausea, vomiting, or diarrhea. 12/18/2019 on evaluation today patient's wound is actually showing signs of fairly good granulation at this point and epithelization. He does have some callus still covering over the little tunnel off to the side that still seems to be staying open despite the fact that we have been trying to keep the callus away from this is much as possible. Fortunately there is no signs of active infection systemically at this time overall I feel like the patient is making good progress in that regard. There is no erythema whatsoever noted. With that being said were still trying to check into MRI in where we stand in that regard. Apparently has been approved but not scheduled. 12/26/2019 upon evaluation today patient appears to be doing decently well in regard to his wound. He does have some callus buildup beginning to clear this away. Fortunately there is no signs of active infection at this time. No fevers, chills, nausea, vomiting, or diarrhea. 01/05/2020 upon evaluation today patient appears to be doing much better in regard to his foot ulcer. 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 It also did show that he had no evidence of osteomyelitis of the forefoot which was excellent. 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 Overall the fact that he does not have a bone infection is the ideal thing here. 11/13/2019 upon evaluation today patient appears to be doing about  the same in regards to his foot ulcer. Unfortunately he has not shown signs of significant improvement at this time which is unfortunate. I feel like that the area where the remaining metatarsal bone is where he had the fifth ray amputation is actually a significant pressure point this causing some issues here for him. I may want him to have him go back to the podiatrist to see if there is anything that he could have done to try to clear this up a bit. I do not know if he needs any additional surgery to possibly trim down the bone area or if potentially a better offloading shoe may be the better way to go. I do want to see about their expertise in this regard. 01/27/2020 upon evaluation today patient appears to be doing excellent in regard to his wound currently. There is no evidence of active infection which is great news and I am very pleased in that regard. With that being said I do see evidence currently that the patient is still developing a significant amount of callus over the area and again I been try to keep this pared back that has made a big difference in preventing infection and again today he does not appear to be infected. With that being said I do believe that the patient is overall doing well he  did see Dr. Amalia Hailey I still do not see that note for review in epic but nonetheless Dr. Amalia Hailey has recommended that based on the shape of the bone and the way it healed that it does need to be shaved down in order to relieve some of the pressure that the patient has been experiencing. For that reason he is going to be undergoing surgery on February 10 in order to try to correct this. 02/03/2020 upon evaluation today patient actually appears to be doing excellent in regard to his wound. There is no signs of infection though it is also not significantly smaller today compared to previous. He does have his surgery scheduled with Dr. Amalia Hailey next Thursday which is only 9  days away. Objective Constitutional Well-nourished and well-hydrated in no acute distress. Vitals Time Taken: 9:10 AM, Height: 74 in, Weight: 308 lbs, BMI: 39.5, Temperature: 98.2 F, Pulse: 77 bpm, Respiratory Rate: 18 breaths/min, Blood Pressure: 151/75 mmHg. Respiratory normal breathing without difficulty. Psychiatric this patient is able to make decisions and demonstrates good insight into disease process. Alert and Oriented x 3. pleasant and cooperative. [name], [name] ([id number]) General Notes: Upon inspection patient's wound bed did require minimal sharp debridement to remove some slough and biofilm from the surface of the wound as well as callus around the edges of the wound. He tolerated all this today without complication post debridement wound bed appears to be doing much better. With that being said I do believe that shaving down the bone so does not protrude as extensively will be beneficial for him that is next Thursday with Dr. Amalia Hailey. Integumentary (Hair, Skin) Wound #4R status is Open. Original cause of wound was Gradually Appeared. The wound is located on the Right,Lateral Foot. The wound measures 0.4cm length x 0.5cm width x 0.4cm depth; 0.157cm^2 area and 0.063cm^3 volume. There is Fat Layer (Subcutaneous Tissue) exposed. There is a medium amount of serosanguineous drainage noted. The wound margin is thickened. There is no granulation within the wound bed. There is no necrotic tissue within the wound bed. General Notes: Callus buildup around wounded area. Assessment Active Problems ICD-10 Type 2 diabetes mellitus with foot ulcer 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d Cellulitis of right lower limb Corns and callosities Essential (primary) hypertension Acquired absence of other left toe(s) Procedures Wound #4R Pre-procedure diagnosis of Wound #4R is a Diabetic Wound/Ulcer of the Lower Extremity located on the  Right,Lateral Foot .Severity of Tissue Pre Debridement is: Fat layer exposed. There was a Excisional Skin/Subcutaneous Tissue Debridement with a total area of 0.2 sq cm performed by Tommie Sams., PA-C. With the following instrument(s): Curette to remove Viable and Non-Viable tissue/material. Material removed includes Callus, Subcutaneous Tissue, and Slough. A time out was conducted at 09:42, prior to the start of the procedure. A Minimum amount of bleeding was controlled with Pressure. The procedure was tolerated well. Post Debridement Measurements: 0.4cm length x 0.5cm width x 0.5cm depth; 0.079cm^3 volume. Character of Wound/Ulcer Post Debridement is stable. Severity of Tissue Post Debridement is: Fat layer exposed. Post procedure Diagnosis Wound #4R: Same as Pre-Procedure Plan Follow-up Appointments: Return Appointment in 2 weeks. Nurse Visit as needed Off-Loading: Wound #4R Right,Lateral Foot: Open toe surgical shoe Offloading felt to foot. WOUND #4R: - Foot Wound Laterality: Right, Lateral Cleanser: Byram Ancillary Kit - 15 Day Supply 3 x Per Week/30 Days Discharge Instructions: Use supplies as instructed; Kit contains: (15) Saline Bullets; (15) 3x3 Gauze; 15 pr Gloves  Cleanser: Normal Saline 3 x Per Week/30 Days Discharge Instructions: Wash your hands with soap and water. Remove old dressing, discard into plastic bag and place into trash. Cleanse the wound with Normal Saline prior to applying a clean dressing using gauze sponges, not tissues or cotton balls. Do not scrub or use excessive force. Pat dry using gauze sponges, not tissue or cotton balls. Primary Dressing: Endoform 2x2 (in/in) 3 x Per Week/30 Days Discharge Instructions: Apply Endoform as directed Secondary Dressing: Gauze 3 x Per Week/30 Days Discharge Instructions: As directed: dry, moistened with saline or moistened with Dakins Solution Secured With: Conforming Stretch Gauze Bandage 4x75 (in/in) 3 x Per Week/30  Days Discharge Instructions: Apply as directed [name], [name] ([id number]) 1. Would recommend currently that we going to continue with wound care measures as before the patient is in agreement with the plan this includes the use of endoform which does seem to have been beneficial for him. Secondarily were using rolled gauze and stretch net to secure in place. 2. I am also can recommend he continue with offloading shoe to try to keep pressure off of the area. At this point I would recommend that we see the patient back for follow-up visit as needed. He will be having surgery next Thursday with Dr. Amalia Hailey I am not certain that he is going to require services here at the wound care center following. Electronic Signature(s) Signed: 02/03/2020 10:02:03 AM By: [name] PA-C Entered By: [name] on 02/03/2020 10:02:03 [name] ([id number]) -------------------------------------------------------------------------------- SuperBill Details Patient Name: [name] Date of Service: 02/03/2020 Medical Record Number: [id number] Patient Account Number: [id number] Date of Birth/Sex: 06/01/1965 (55 y.o. M) Treating RN: Dolan Amen Primary Care Provider: Berneta Sages Other Clinician: Referring Provider: Berneta Sages Treating Provider/Extender: Skipper Cliche in Treatment: 46 Diagnosis Coding ICD-10 Codes Code Description E11.621 Type 2 diabetes mellitus with foot ulcer L97.512 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d L03.115 Cellulitis of right lower limb L84 Corns and callosities I10 Essential (primary) hypertension Z89.422 Acquired absence of other left toe(s) Facility Procedures CPT4 Code: [id number] Description: 04888 - DEB SUBQ TISSUE 20 SQ CM/< Modifier: Quantity: 1 CPT4 Code: Description: ICD-10 Diagnosis Description L97.512 Non-pressure chronic ulcer of other part of right foot with fat layer ex Modifier:  posed Quantity: Physician Procedures CPT4 Code: 9169450 Description: 11042 - WC PHYS SUBQ TISS 20 SQ CM Modifier: Quantity: 1 CPT4 Code: Description: ICD-10 Diagnosis Description L97.512 Non-pressure chronic ulcer of other part of right foot with fat layer ex Modifier: posed Quantity: Electronic Signature(s) Signed: 02/03/2020 10:06:05 AM By: [name] PA-C Entered By: [name] on 02/03/2020 10:06:04

## 2020-02-03 NOTE — Telephone Encounter (Signed)
DOS 02/12/2020  TARSAL EXOSTECTOMY RT - 28104  [employer] EFFECTIVE DATE - 01/02/2018  PLAN DEDUCTIBLE - $0.00 OUT OF POCKET - $4000.00 W/ $250.00 REMAINING COPAY $0.00 COINSURANCE - 100% AFTER OOP  PER AUTOMATED SYSTEM NO PRECERT IS REQUIRED FOR THE FOLLOWING CPT 28104 CONF# [id number] CONF# [id number]

## 2020-02-04 NOTE — Progress Notes (Addendum)
VORIS, TIGERT ([id number]) Visit Report for 02/03/2020 Arrival Information Details Patient Name: [name], [name]. Date of Service: 02/03/2020 9:15 AM Medical Record Number: [id number] Patient Account Number: [id number] Date of Birth/Sex: 1965-01-27 (55 y.o. M) Treating RN: Dolan Amen Primary Care Kanaan Kagawa: Berneta Sages Other Clinician: Referring Jett Kulzer: Berneta Sages Treating Urie Loughner/Extender: Skipper Cliche in Treatment: 44 Visit Information History Since Last Visit Added or deleted any medications: No Patient Arrived: Ambulatory Any new allergies or adverse reactions: No Arrival Time: 09:11 Had a fall or experienced change in No Accompanied By: self activities of daily living that may affect Transfer Assistance: None risk of falls: Patient Identification Verified: Yes Signs or symptoms of abuse/neglect since last visito No Secondary Verification Process Completed: Yes Hospitalized since last visit: No Patient Requires Transmission-Based Precautions: No Implantable device outside of the clinic excluding No Patient Has Alerts: Yes cellular tissue based products placed in the center Patient Alerts: DMII since last visit: Has Dressing in Place as Prescribed: Yes Pain Present Now: No Electronic Signature(s) Signed: 02/03/2020 4:20:22 PM By: Lorine Bears RCP, RRT, CHT Entered By: Lorine Bears on 02/03/2020 09:11:54 [name] ([id number]) -------------------------------------------------------------------------------- Encounter Discharge Information Details Patient Name: [name] Date of Service: 02/03/2020 9:15 AM Medical Record Number: [id number] Patient Account Number: [id number] Date of Birth/Sex: May 31, 1965 (55 y.o. M) Treating RN: Dolan Amen Primary Care Phillip Sandler: Berneta Sages Other Clinician: Referring Kyan Yurkovich: Berneta Sages Treating Herndon Grill/Extender: Skipper Cliche in Treatment: 12 Encounter Discharge  Information Items Post Procedure Vitals Discharge Condition: Stable Temperature (F): 98.2 Ambulatory Status: Ambulatory Pulse (bpm): 77 Discharge Destination: Home Respiratory Rate (breaths/min): 18 Transportation: Private Auto Blood Pressure (mmHg): 151/75 Accompanied By: self Schedule Follow-up Appointment: Yes Clinical Summary of Care: Patient Declined Electronic Signature(s) Signed: 02/04/2020 9:59:20 AM By: Carlene Coria RN Previous Signature: 02/03/2020 9:52:11 AM Version By: Georges Mouse, Minus Breeding RN Entered By: Carlene Coria on 02/03/2020 09:55:30 [name] ([id number]) -------------------------------------------------------------------------------- Lower Extremity Assessment Details Patient Name: [name] Date of Service: 02/03/2020 9:15 AM Medical Record Number: [id number] Patient Account Number: [id number] Date of Birth/Sex: [birth year] (55 y.o. M) Treating RN: Cornell Barman Primary Care Tameya Kuznia: Berneta Sages Other Clinician: Referring Shameca Landen: Berneta Sages Treating Tanganika Barradas/Extender: Skipper Cliche in Treatment: 46 Edema Assessment Assessed: [Left: No] Patrice Paradise: Yes] [Left: Edema] [Right: :] Vascular Assessment Pulses: Dorsalis Pedis Palpable: [Right:Yes] Electronic Signature(s) Signed: 02/03/2020 6:03:28 PM By: Gretta Cool, BSN, RN, CWS, Kim RN, BSN Entered By: Gretta Cool, BSN, RN, CWS, Kim on 02/03/2020 09:31:50 [name] ([id number]) -------------------------------------------------------------------------------- Multi Wound Chart Details Patient Name: [name]. Date of Service: 02/03/2020 9:15 AM Medical Record Number: [id number] Patient Account Number: [id number] Date of Birth/Sex: June 19, 1965 (55 y.o. M) Treating RN: Dolan Amen Primary Care Sophiya Morello: Berneta Sages Other Clinician: Referring Kimyetta Flott: Berneta Sages Treating Luke Rigsbee/Extender: Skipper Cliche in Treatment: 49 Vital Signs Height(in): 74 Pulse(bpm): 54 Weight(lbs):  308 Blood Pressure(mmHg): 151/75 Body Mass Index(BMI): 40 Temperature(F): 98.2 Respiratory Rate(breaths/min): 18 Photos: [N/A:N/A] Wound Location: Right, Lateral Foot N/A N/A Wounding Event: Gradually Appeared N/A N/A Primary Etiology: Diabetic Wound/Ulcer of the Lower N/A N/A Extremity Comorbid History: Hypertension, Type II Diabetes, N/A N/A Neuropathy Date Acquired: 03/12/2019 N/A N/A Weeks of Treatment: 46 N/A N/A Wound Status: Open N/A N/A Wound Recurrence: Yes N/A N/A Pending Amputation on Yes N/A N/A Presentation: Measurements L x W x D (cm) 0.4x0.5x0.4 N/A N/A Area (cm) : 0.157 N/A N/A Volume (cm) : 0.063 N/A N/A % Reduction in Area: 75.30% N/A N/A % Reduction  in Volume: 50.40% N/A N/A Classification: Grade 1 N/A N/A Exudate Amount: Medium N/A N/A Exudate Type: Serosanguineous N/A N/A Exudate Color: red, brown N/A N/A Wound Margin: Thickened N/A N/A Granulation Amount: None Present (0%) N/A N/A Necrotic Amount: None Present (0%) N/A N/A Exposed Structures: Fat Layer (Subcutaneous Tissue): N/A N/A Yes Fascia: No Tendon: No Muscle: No Joint: No Bone: No Epithelialization: Small (1-33%) N/A N/A Assessment Notes: Callus buildup around wounded N/A N/A area. Treatment Notes Electronic Signature(s) Signed: 02/03/2020 11:59:19 AM By: Georges Mouse, Minus Breeding RN VERDUN, ACKERMANN ([id number]) Entered By: Georges Mouse, Minus Breeding on 02/03/2020 09:43:24 [name] ([id number]) -------------------------------------------------------------------------------- Wade Details Patient Name: [name] Date of Service: 02/03/2020 9:15 AM Medical Record Number: [id number] Patient Account Number: [id number] Date of Birth/Sex: Sep 21, 1965 (55 y.o. M) Treating RN: Dolan Amen Primary Care Yovanni Frenette: Berneta Sages Other Clinician: Referring Brynn Reznik: Berneta Sages Treating Janeil Schexnayder/Extender: Skipper Cliche in Treatment: 69 Active  Inactive Electronic Signature(s) Signed: 03/05/2020 4:20:05 PM By: Gretta Cool BSN, RN, CWS, Kim RN, BSN Signed: 04/09/2020 11:57:16 AM By: Charlett Nose RN Previous Signature: 02/03/2020 11:59:19 AM Version By: Georges Mouse, Minus Breeding RN Entered By: Gretta Cool, BSN, RN, CWS, Kim on 03/05/2020 16:20:04 [name] ([id number]) -------------------------------------------------------------------------------- Pain Assessment Details Patient Name: SEGUNDO, NEVEL. Date of Service: 02/03/2020 9:15 AM Medical Record Number: [id number] Patient Account Number: [id number] Date of Birth/Sex: [birth year] (55 y.o. M) Treating RN: Cornell Barman Primary Care Lanyla Costello: Berneta Sages Other Clinician: Referring Delenn Ahn: Berneta Sages Treating Emeric Novinger/Extender: Skipper Cliche in Treatment: 46 Active Problems Location of Pain Severity and Description of Pain Patient Has Paino No Site Locations Pain Management and Medication Current Pain Management: Notes Patient denies pain at this time. Electronic Signature(s) Signed: 02/03/2020 6:03:28 PM By: Gretta Cool, BSN, RN, CWS, Kim RN, BSN Entered By: Gretta Cool, BSN, RN, CWS, Kim on 02/03/2020 09:30:08 [name] ([id number]) -------------------------------------------------------------------------------- Patient/Caregiver Education Details Patient Name: JEREMAI, RUSSIN Date of Service: 02/03/2020 9:15 AM Medical Record Number: [id number] Patient Account Number: [id number] Date of Birth/Gender: October 11, 1965 (55 y.o. M) Treating RN: Dolan Amen Primary Care Physician: Berneta Sages Other Clinician: Referring Physician: Berneta Sages Treating Physician/Extender: Skipper Cliche in Treatment: 28 Education Assessment Education Provided To: Patient Education Topics Provided Wound/Skin Impairment: Methods: Explain/Verbal Responses: State content correctly Electronic Signature(s) Signed: 02/03/2020 11:59:19 AM By: Georges Mouse, Minus Breeding RN Entered By:  Georges Mouse, Minus Breeding on 02/03/2020 09:46:00 [name] ([id number]) -------------------------------------------------------------------------------- Wound Assessment Details Patient Name: [name] Date of Service: 02/03/2020 9:15 AM Medical Record Number: [id number] Patient Account Number: [id number] Date of Birth/Sex: 1965/05/31 (55 y.o. M) Treating RN: Cornell Barman Primary Care Verdean Murin: Berneta Sages Other Clinician: Referring Fernandez Kenley: Berneta Sages Treating Sigmund Morera/Extender: Skipper Cliche in Treatment: 46 Wound Status Wound Number: [id number] Primary Etiology: Diabetic Wound/Ulcer of the Lower Extremity Wound Location: Right, Lateral Foot Wound Status: Open Wounding Event: Gradually Appeared Comorbid History: Hypertension, Type II Diabetes, Neuropathy Date Acquired: 03/12/2019 Weeks Of Treatment: 46 Clustered Wound: No Pending Amputation On Presentation Photos Wound Measurements Length: (cm) 0.4 Width: (cm) 0.5 Depth: (cm) 0.4 Area: (cm) 0.157 Volume: (cm) 0.063 % Reduction in Area: 75.3% % Reduction in Volume: 50.4% Epithelialization: Small (1-33%) Wound Description Classification: Grade 1 Wound Margin: Thickened Exudate Amount: Medium Exudate Type: Serosanguineous Exudate Color: red, brown Foul Odor After Cleansing: No Slough/Fibrino Yes Wound Bed Granulation Amount: None Present (0%) Exposed Structure Necrotic Amount: None Present (0%) Fascia Exposed: No Fat Layer (Subcutaneous Tissue) Exposed: Yes Tendon Exposed: No Muscle Exposed:  No Joint Exposed: No Bone Exposed: No Assessment Notes Callus buildup around wounded area. Electronic Signature(s) Signed: 02/03/2020 6:03:28 PM By: Gretta Cool, BSN, RN, CWS, Kim RN, BSN Entered By: Gretta Cool, BSN, RN, CWS, Kim on 02/03/2020 09:31:23 [name] ([id number]) -------------------------------------------------------------------------------- Vitals Details Patient Name: KAZMIR, OKI. Date of  Service: 02/03/2020 9:15 AM Medical Record Number: [id number] Patient Account Number: [id number] Date of Birth/Sex: 07/28/1965 (55 y.o. M) Treating RN: Dolan Amen Primary Care Jimel Myler: Berneta Sages Other Clinician: Referring Genaro Bekker: Berneta Sages Treating Nova Schmuhl/Extender: Skipper Cliche in Treatment: 33 Vital Signs Time Taken: 09:10 Temperature (F): 98.2 Height (in): 74 Pulse (bpm): 77 Weight (lbs): 308 Respiratory Rate (breaths/min): 18 Body Mass Index (BMI): 39.5 Blood Pressure (mmHg): 151/75 Reference Range: 80 - 120 mg / dl Electronic Signature(s) Signed: 02/03/2020 4:20:22 PM By: Lorine Bears RCP, RRT, CHT Entered By: Lorine Bears on 02/03/2020 09:12:17

## 2020-02-09 ENCOUNTER — Other Ambulatory Visit: Payer: Self-pay | Admitting: Podiatry

## 2020-02-09 ENCOUNTER — Ambulatory Visit (INDEPENDENT_AMBULATORY_CARE_PROVIDER_SITE_OTHER): Payer: Managed Care, Other (non HMO) | Admitting: Podiatry

## 2020-02-09 ENCOUNTER — Other Ambulatory Visit: Payer: Self-pay

## 2020-02-09 DIAGNOSIS — L97512 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d: Secondary | ICD-10-CM

## 2020-02-10 ENCOUNTER — Encounter: Payer: Managed Care, Other (non HMO) | Admitting: Physician Assistant

## 2020-02-11 LAB — WOUND CULTURE

## 2020-02-12 ENCOUNTER — Other Ambulatory Visit: Payer: Self-pay | Admitting: Podiatry

## 2020-02-12 DIAGNOSIS — L97512 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d: Secondary | ICD-10-CM | POA: Diagnosis not present

## 2020-02-12 DIAGNOSIS — M25774 Osteophyte, right foot: Secondary | ICD-10-CM | POA: Diagnosis not present

## 2020-02-12 MED ORDER — DOXYCYCLINE HYCLATE 100 MG PO TABS: 100.0000 mg | ORAL_TABLET | Freq: Two times a day (BID) | ORAL | 0 refills | Status: DC

## 2020-02-12 MED ORDER — OXYCODONE-ACETAMINOPHEN 5-325 MG PO TABS: 1.0000 | ORAL_TABLET | ORAL | 0 refills | Status: DC | PRN

## 2020-02-12 NOTE — Progress Notes (Signed)
PRN postop 

## 2020-02-20 ENCOUNTER — Other Ambulatory Visit: Payer: Self-pay

## 2020-02-20 ENCOUNTER — Encounter: Payer: Self-pay | Admitting: Podiatry

## 2020-02-20 ENCOUNTER — Ambulatory Visit (INDEPENDENT_AMBULATORY_CARE_PROVIDER_SITE_OTHER): Payer: Managed Care, Other (non HMO)

## 2020-02-20 ENCOUNTER — Ambulatory Visit (INDEPENDENT_AMBULATORY_CARE_PROVIDER_SITE_OTHER): Payer: Managed Care, Other (non HMO) | Admitting: Podiatry

## 2020-02-20 VITALS — BP 150/73 | HR 91 | Temp 98.4°F

## 2020-02-20 DIAGNOSIS — L97512 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d: Secondary | ICD-10-CM

## 2020-02-20 DIAGNOSIS — Z9889 Other specified postprocedural states: Secondary | ICD-10-CM

## 2020-02-20 DIAGNOSIS — M898X7 Other specified disorders of bone, ankle and foot: Secondary | ICD-10-CM

## 2020-02-20 NOTE — Progress Notes (Signed)
   Subjective:  Patient presents today status post fifth metatarsal tubercle exostectomy right foot.. DOS: 02/12/2020.  Patient states he is doing well.  Hardly any pain at all.  He has been weightbearing in the cam boot and has kept the dressings clean and dry over the past week.  No new complaints at this time  Past Medical History:  Diagnosis Date  . Broken ankle   . Broken arm   . Diabetes mellitus without complication ([place])    type 2  . Diabetic retinopathy ([place])    PDR OU  . History of Bell's palsy   . Hyperlipidemia   . Hypertension   . Osteomyelitis ([place])    foot      Objective/Physical Exam Neurovascular status intact.  Skin incisions appear to be well coapted with staples intact.  There are some very mild localized erythema around the incision site with some superficial purulence of the plantar ulcer. No dehiscence. No active bleeding noted.  Wound noted to the subfifth metatarsal tubercle measuring approximately 1.0 x 1.0 x 0.2 cm.  To the noted ulceration there is no eschar.  There is a minimal amount of slough fibrin and necrotic tissue noted.  Granulation tissue and wound base is red.  There is no exposed bone muscle tendon ligament or joint  Radiographic Exam:  Osteotomy site appears to be stable with routine healing.  Assessment: 1. s/p fifth metatarsal exostectomy right. DOS: 02/12/2020   Plan of Care:  1. Patient was evaluated. X-rays reviewed 2.  Dressings changed today.  Keep clean dry and intact x1 week 3.  Continue oral antibiotics as prescribed 4.  Continue minimal weightbearing in the cam boot 5.  Return to clinic in 10 days for staple removal  *Goes by [employer].  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    Edrick Kins, DPM [facility]  Dr. Edrick Kins, DPM    [address], [place] [address]                Office [phone]  Fax [phone]

## 2020-02-27 ENCOUNTER — Encounter: Payer: Managed Care, Other (non HMO) | Admitting: Podiatry

## 2020-03-02 ENCOUNTER — Ambulatory Visit: Payer: Managed Care, Other (non HMO) | Admitting: Podiatry

## 2020-03-02 ENCOUNTER — Other Ambulatory Visit: Payer: Self-pay

## 2020-03-02 DIAGNOSIS — E0843 Diabetes mellitus due to underlying condition with diabetic autonomic (poly)neuropathy: Secondary | ICD-10-CM

## 2020-03-02 DIAGNOSIS — M898X7 Other specified disorders of bone, ankle and foot: Secondary | ICD-10-CM

## 2020-03-02 DIAGNOSIS — L97512 Non-pressure chronic ulcer of other part of right foot with fat layer exposed: Secondary | ICD-10-CM

## 2020-03-02 DIAGNOSIS — Z9889 Other specified postprocedural states: Secondary | ICD-10-CM

## 2020-03-02 NOTE — Progress Notes (Signed)
   Subjective:  Patient presents today status post fifth metatarsal tubercle exostectomy right foot.. DOS: 02/12/2020.  Patient states he is doing well.  Hardly any pain at all.  He has been weightbearing in the cam boot and has kept the dressings clean and dry over the past week.  No new complaints at this time  Past Medical History:  Diagnosis Date  . Broken ankle   . Broken arm   . Diabetes mellitus without complication ([place])    type 2  . Diabetic retinopathy ([place])    PDR OU  . History of Bell's palsy   . Hyperlipidemia   . Hypertension   . Osteomyelitis ([place])    foot      Objective/Physical Exam Neurovascular status intact.  Skin incisions appear to be well coapted with staples intact.  There are some very mild localized erythema around the incision site with some superficial purulence of the plantar ulcer. No dehiscence. No active bleeding noted.  Wound noted to the subfifth metatarsal tubercle measuring approximately 0.2 x 0.5 x 0.1 cm.  To the noted ulceration there is no eschar.  There is a minimal amount of slough fibrin and necrotic tissue noted.  Granulation tissue and wound base is red.  There is no exposed bone muscle tendon ligament or joint  Assessment: 1. s/p fifth metatarsal exostectomy right. DOS: 02/12/2020   Plan of Care:  1. Patient was evaluated.  Staples removed 2.  Dressings changed today.  Keep clean dry and intact x1 week 3.  Continue oral antibiotics as prescribed until completed 4.  Continue minimal weightbearing in the cam boot 5.  Return to clinic in 1 week  *Goes by [employer].  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    Edrick Kins, DPM [facility]  Dr. Edrick Kins, DPM    [address], [place] [address]                Office [phone]  Fax [phone]

## 2020-03-09 ENCOUNTER — Other Ambulatory Visit: Payer: Self-pay

## 2020-03-09 ENCOUNTER — Ambulatory Visit (INDEPENDENT_AMBULATORY_CARE_PROVIDER_SITE_OTHER): Payer: Managed Care, Other (non HMO) | Admitting: Podiatry

## 2020-03-09 DIAGNOSIS — L97512 Non-pressure chronic ulcer of other part of right foot with fat layer exposed: Secondary | ICD-10-CM | POA: Diagnosis not present

## 2020-03-09 DIAGNOSIS — E0843 Diabetes mellitus due to underlying condition with diabetic autonomic (poly)neuropathy: Secondary | ICD-10-CM

## 2020-03-09 DIAGNOSIS — M898X7 Other specified disorders of bone, ankle and foot: Secondary | ICD-10-CM

## 2020-03-09 DIAGNOSIS — Z9889 Other specified postprocedural states: Secondary | ICD-10-CM

## 2020-03-09 NOTE — Progress Notes (Signed)
   Subjective:  Patient presents today status post fifth metatarsal tubercle exostectomy right foot.. DOS: 02/12/2020.  Patient states he is doing well.  Hardly any pain at all.  He has been weightbearing in the cam boot and has kept the dressings clean and dry over the past week.  No new complaints at this time  Past Medical History:  Diagnosis Date  . Broken ankle   . Broken arm   . Diabetes mellitus without complication ([place])    type 2  . Diabetic retinopathy ([place])    PDR OU  . History of Bell's palsy   . Hyperlipidemia   . Hypertension   . Osteomyelitis ([place])    foot      Objective/Physical Exam Neurovascular status intact.  Skin incisions appear to be well coapted and healed.  There are some very mild localized erythema around the incision site with some superficial purulence of the plantar ulcer. No dehiscence. No active bleeding noted.  Wound noted to the subfifth metatarsal tubercle measuring approximately 0.2 x 0.2 x 0.1 cm.  To the noted ulceration there is no eschar.  There is a minimal amount of slough fibrin and necrotic tissue noted.  Granulation tissue and wound base is red.  There is no exposed bone muscle tendon ligament or joint  Assessment: 1. s/p fifth metatarsal exostectomy right. DOS: 02/12/2020   Plan of Care:  1. Patient was evaluated.   2.  Medically necessary excisional debridement including subcutaneous tissue was performed using a tissue nipper without incident or bleeding.  Excisional debridement of all necrotic nonviable tissue down to healthy bleeding viable tissue was performed with post debridement measurement same as. 3.  Light dressing applied.  Recommend antibiotic cream and a Band-Aid daily  4.  Offloading metatarsal pads provided for the patient  5.  Continue cam boot weightbearing as tolerated  6.  Return to clinic in 2 weeks  *Goes by [employer].  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    Edrick Kins, DPM [facility]  Dr. Edrick Kins, DPM    [address],  [address]                Office [phone]  Fax [phone]

## 2020-03-12 ENCOUNTER — Encounter: Payer: Managed Care, Other (non HMO) | Admitting: Podiatry

## 2020-03-23 ENCOUNTER — Other Ambulatory Visit: Payer: Self-pay

## 2020-03-23 ENCOUNTER — Ambulatory Visit (INDEPENDENT_AMBULATORY_CARE_PROVIDER_SITE_OTHER): Payer: Managed Care, Other (non HMO) | Admitting: Podiatry

## 2020-03-23 DIAGNOSIS — Z9889 Other specified postprocedural states: Secondary | ICD-10-CM

## 2020-03-23 DIAGNOSIS — E0843 Diabetes mellitus due to underlying condition with diabetic autonomic (poly)neuropathy: Secondary | ICD-10-CM

## 2020-03-23 DIAGNOSIS — M898X7 Other specified disorders of bone, ankle and foot: Secondary | ICD-10-CM

## 2020-03-23 DIAGNOSIS — L97512 Non-pressure chronic ulcer of other part of right foot with fat layer exposed: Secondary | ICD-10-CM

## 2020-03-23 NOTE — Progress Notes (Signed)
   Subjective:  Patient presents today status post fifth metatarsal tubercle exostectomy right foot.. DOS: 02/12/2020.  Patient states he is doing well.  Patient has no pain.  He believes the wound is healed.  He has not noticed any drainage.  Past Medical History:  Diagnosis Date  . Broken ankle   . Broken arm   . Diabetes mellitus without complication ([place])    type 2  . Diabetic retinopathy ([place])    PDR OU  . History of Bell's palsy   . Hyperlipidemia   . Hypertension   . Osteomyelitis ([place])    foot      Objective/Physical Exam Neurovascular status intact.  Skin incisions appear to be well coapted and healed.  There are some very mild localized erythema around the incision site with some superficial purulence of the plantar ulcer. No dehiscence. No active bleeding noted.  Wound noted to the subfifth metatarsal tubercle has healed.  Complete reepithelialization has occurred.  Periwound is slightly callused  Assessment: 1. s/p fifth metatarsal exostectomy right. DOS: 02/12/2020   Plan of Care:  1. Patient was evaluated.   2.  Wound is completely healed.  Light debridement of the callus tissue was performed using a tissue nipper and 312 scalpel without incident or bleeding 3.  Patient may discontinue cam boot.  Recommend good supportive shoes 4.  Appointment with Pedorthist for new diabetic shoes fitting 5.  Return to clinic as needed  *Goes by [employer].  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    Edrick Kins, DPM [facility]  Dr. Edrick Kins, DPM    [address], [place] [address]                Office [phone]  Fax [phone]

## 2020-04-14 ENCOUNTER — Ambulatory Visit: Payer: Managed Care, Other (non HMO)

## 2020-05-12 ENCOUNTER — Ambulatory Visit: Payer: Managed Care, Other (non HMO)

## 2020-06-09 ENCOUNTER — Ambulatory Visit: Payer: Managed Care, Other (non HMO)

## 2020-06-09 ENCOUNTER — Other Ambulatory Visit: Payer: Self-pay

## 2020-06-17 ENCOUNTER — Telehealth: Payer: Self-pay | Admitting: Podiatry

## 2020-06-17 NOTE — Telephone Encounter (Signed)
Pt was measured for diabetic shoes by ej but the shoe style was not written down. I have left message for pt to call to discuss and see if he can remember what shoe it was that he picked out.

## 2020-06-22 ENCOUNTER — Telehealth: Payer: Self-pay | Admitting: Podiatry

## 2020-06-22 NOTE — Telephone Encounter (Signed)
Pt was seen by EJ and the shoe he chose was not written down on the foam impression box. Pt just needs to stop in the office and pick out a new shoe and if you can let me know I will get them ordered.  He is to stop in [place] office hoping to get there this week.

## 2020-06-23 NOTE — Telephone Encounter (Signed)
Pt presented to office and has chosen [address]

## 2020-07-14 ENCOUNTER — Telehealth: Payer: Self-pay | Admitting: Gastroenterology

## 2020-07-14 ENCOUNTER — Encounter: Payer: Self-pay | Admitting: Gastroenterology

## 2020-07-14 ENCOUNTER — Ambulatory Visit: Payer: Managed Care, Other (non HMO) | Admitting: Gastroenterology

## 2020-07-14 VITALS — BP 114/60 | HR 88 | Ht 73.75 in | Wt 299.0 lb

## 2020-07-14 DIAGNOSIS — R195 Other fecal abnormalities: Secondary | ICD-10-CM

## 2020-07-14 DIAGNOSIS — Z800 Family history of malignant neoplasm of digestive organs: Secondary | ICD-10-CM

## 2020-07-14 DIAGNOSIS — Z86010 Personal history of colonic polyps: Secondary | ICD-10-CM | POA: Diagnosis not present

## 2020-07-14 MED ORDER — SUTAB 1479-225-188 MG PO TABS: 1.0000 | ORAL_TABLET | Freq: Once | ORAL | 0 refills | Status: AC

## 2020-07-14 NOTE — Patient Instructions (Signed)
If you are age 54 or older, your body mass index should be between 23-30. Your Body mass index is 38.65 kg/m. If this is out of the aforementioned range listed, please consider follow up with your Primary Care Provider.  If you are age 28 or younger, your body mass index should be between 19-25. Your Body mass index is 38.65 kg/m. If this is out of the aformentioned range listed, please consider follow up with your Primary Care Provider.   __________________________________________________________  The [facility] providers would like to encourage you to use [facility] to communicate with providers for non-urgent requests or questions.  Due to long hold times on the telephone, sending your provider a message by [facility] may be a faster and more efficient way to get a response.  Please allow 48 business hours for a response.  Please remember that this is for non-urgent requests.   You have been scheduled for a colonoscopy. Please follow written instructions given to you at your visit today.  Please pick up your prep supplies at the pharmacy within the next 1-3 days. If you use inhalers (even only as needed), please bring them with you on the day of your procedure.  Thank you for entrusting me with your care and for choosing [facility], Dr[facility] [place]

## 2020-07-14 NOTE — Progress Notes (Signed)
HPI :  55 year old male with a history of numerous colon polyps, strong family history of colon cancer, here to discuss surveillance colonoscopy.  Patient has had colonoscopy in 2017 and again in 01/2019, has had close to 30 adenomas removed over these 2 exams.  Unfortunately his last colonoscopy he had an advanced adenoma removed via hot snare in the right colon and was complicated by post polypectomy bleed which was managed with clipping at the hospital and resolved his bleeding.  He recovered well from that and denies any problems with his bowels.  I had recommended a follow-up colonoscopy in 1 year after his last exam given the burden of polyps he has had.  I had also recommended genetic testing to screen for polyposis syndromes given these findings and his family history.  He has 1 sibling and 3 children.  He denies any problems with his bowels.  No blood in his stools.  No abdominal pains.  Otherwise feels well without complaints.  He states for his yearly physical he had a stool test that was positive for blood but he does not see any overtly.   Colonoscopy 01/25/19 - Dr. Collene Mares - clipped polypectomy sites suspected of bleeding  Colonoscopy 01/17/19 - One 10 mm polyp in the ascending colon, removed with Endocut setting snare. Resected and retrieved. - A tattoo was seen in the ascending colon. A post-polypectomy scar was found at the tattoo site. - Two 3 mm polyps in the ascending colon, removed with a cold snare. Resected and retrieved. - Two 3 to 5 mm polyps at the hepatic flexure, removed with a cold snare. Resected and retrieved. - Four 3 to 4 mm polyps in the transverse colon, removed with a cold snare. Resected and retrieved. - Two 2 to 3 mm polyps in the descending colon, removed with a cold snare. Resected and retrieved. - One 3 mm polyp in the sigmoid colon, removed with a cold snare. Resected and retrieved. - Anal papilla(e) were hypertrophied. - The examination was otherwise  normal.  1. Surgical [P], colon, transverse x4, hepatic flexure x 2, ascending x 2, descending x2, sigmoid x1, polyp (11) - TUBULAR ADENOMA (MULTIPLE FRAGMENTS). - NO HIGH GRADE DYSPLASIA OR MALIGNANCY. 2. Surgical [P], colon, ascending, polyp - TUBULOVILLOUS ADENOMA (MULTIPLE FRAGMENTS). - NO HIGH-GRADE DYSPLASIA OR MALIGNANCY.    Colonoscopy 12/24/2015 - One 6 mm polyp in the cecum, removed with a cold snare. Resected and retrieved. - One 3 mm polyp at the ileocecal valve, removed with a cold snare. Resected and retrieved. - One 20-25 mm polyp in the ascending colon, removed with a hot snare. Resected and retrieved. Tattooed. - One 6 mm polyp in the ascending colon, removed with a cold snare. Resected and retrieved. - Four 5 to 8 mm polyps at the hepatic flexure, removed with a cold snare. Resected and retrieved. - Three 4 to 6 mm polyps in the transverse colon, removed with a cold snare. Resected and retrieved. - One 5 mm polyp at the splenic flexure, removed with a cold snare. Resected and retrieved. - One 5 mm polyp in the descending colon, removed with a cold snare. Resected and retrieved. - Two 3 to 5 mm polyps in the sigmoid colon, removed with a cold snare. Resected and retrieved. - One 4 mm polyp at the recto-sigmoid colon, removed with a cold snare. Resected and retrieved. - Colonic spasm. - Internal hemorrhoids. - The examination was otherwise normal.  Surgical [P], hepatic flexure, ascending, cecum and ileocecal valve,  transverse, splenic flexure, descending, sigmoid, recto-sigmoid, polyp (14) - FLAT ADENOMAS (2) - TUBULAR ADENOMAS (3). - NO HIGH GRADE DYSPLASIA OR MALIGNANCY IDENTIFIED. - FRAGMENTS OF BENIGN COLONIC EPITHELIUM, MULTIPLE. 2. Surgical [P], ascending, polyp - TUBULAR ADENOMA. - NO HIGH GRADE DYSPLASIA OR MALIGNANCY IDENTIFIED.       Past Medical History:  Diagnosis Date   Broken ankle    Broken arm    Diabetes mellitus without complication  ([place])    type 2   Diabetic retinopathy ([place])    PDR OU   History of Bell's palsy    Hyperlipidemia    Hypertension    Osteomyelitis ([place])    foot     Past Surgical History:  Procedure Laterality Date   AMPUTATION Right 03/04/2015   Procedure: PARTIAL AMPUTATION RIGHT 5TH METATARSAL;  Surgeon: Landis Martins, DPM;  Location: [place];  Service: Podiatry;  Laterality: Right;   APPENDECTOMY     COLONOSCOPY  11/17/2005   TAs - Jorgeluis Gurganus   COLONOSCOPY N/A 01/25/2019   Procedure: COLONOSCOPY;  Surgeon: Juanita Craver, MD;  [facility] END[facility]COPY;  Service: Endoscopy;  Laterality: N/A;   EYE SURGERY Bilateral    blood vessels -cautery   HEMOSTASIS CLIP PLACEMENT  01/25/2019   Procedure: HEMOSTASIS CLIP PLACEMENT;  Surgeon: Juanita Craver, MD;  Location: [facility];  Service: Endoscopy;;   I & D EXTREMITY Right 03/04/2015   Procedure: IRRIGATION AND DEBRIDEMENT RIGHT FOOT;  Surgeon: Landis Martins, DPM;  Location: [place];  Service: Podiatry;  Laterality: Right;   WISDOM TOOTH EXTRACTION     only 2 ext   Family History  Problem Relation Age of Onset   Diabetes Father    Colon cancer Father    Colon polyps Father    Esophageal cancer Neg Hx    Rectal cancer Neg Hx    Stomach cancer Neg Hx    Social History   Tobacco Use   Smoking status: Never   Smokeless tobacco: Never  Vaping Use   Vaping Use: Never used  Substance Use Topics   Alcohol use: No   Drug use: No   Current Outpatient Medications  Medication Sig Dispense Refill   Continuous Blood Gluc Sensor (FREESTYLE LIBRE 14 DAY SENSOR) MISC SMARTSIG:1 Topical Every 2 Weeks     empagliflozin (JARDIANCE) 25 MG TABS tablet Take 25 mg by mouth daily.     fluticasone (FLONASE) 50 MCG/ACT nasal spray Place 2 sprays into both nostrils daily as needed for allergies or rhinitis.     HUMALOG KWIKPEN 200 UNIT/ML KwikPen SMARTSIG:15 Unit(s) SUB-Q 3 Times Daily     ibuprofen (ADVIL) 200 MG tablet as needed.     Insulin Degludec (TRESIBA  FLEXTOUCH) 200 UNIT/ML SOPN Inject 100 Units into the skin daily before breakfast.      metFORMIN (GLUCOPHAGE) 1000 MG tablet Take 1,000 mg by mouth 2 (two) times daily.     Multiple Vitamin (MULTIVITAMIN) tablet Take 1 tablet by mouth daily.     Olmesartan-Amlodipine-HCTZ 40-10-25 MG TABS Take 1 tablet by mouth daily. Reported on 02/04/2015     omega-3 acid ethyl esters (LOVAZA) 1 g capsule Take 1 capsule by mouth 2 (two) times daily.     oxyCODONE-acetaminophen (PERCOCET) 5-325 MG tablet Take 1 tablet by mouth every 4 (four) hours as needed for severe pain. 30 tablet 0   rosuvastatin (CRESTOR) 10 MG tablet Take 10 mg by mouth daily.     sildenafil (VIAGRA) 100 MG tablet One by mouth daily when necessary  No current facility-administered medications for this visit.   Allergies  Allergen Reactions   Propofol Other (See Comments)    Caused throat muscles to relax and pt couldn't swallow     Review of Systems: All systems reviewed and negative except where noted in HPI.   Lab Results  Component Value Date   WBC 9.4 12/04/2019   HGB 14.4 12/04/2019   HCT 45.3 12/04/2019   MCV 84.8 12/04/2019   PLT 282 12/04/2019   Lab Results  Component Value Date   CREATININE 1.08 12/04/2019   BUN 23 (H) 12/04/2019   NA 136 12/04/2019   K 4.0 12/04/2019   CL 101 12/04/2019   CO2 23 12/04/2019     Lab Results  Component Value Date   ALT 19 12/04/2019   AST 18 12/04/2019   ALKPHOS 43 12/04/2019   BILITOT 0.5 12/04/2019      Physical Exam: BP 114/60 (BP Location: Left Arm, Patient Position: Sitting, Cuff Size: Normal)   Pulse 88 Comment: irregular  Ht 6' 1.75" (1.873 m) Comment: height measured without shoes  Wt 299 lb (135.6 kg)   BMI 38.65 kg/m  Constitutional: Pleasant,well-developed, male in no acute distress. Neurological: Alert and oriented to person place and time. Psychiatric: Normal mood and affect. Behavior is normal.   ASSESSMENT AND PLAN: 55 year old male here  for reassessment of the following:  History of colon polyps Family history of colon cancer Heme positive stool  Patient with significant polyp burden to include advanced adenomas over his last 2 exams, almost 30 precancerous polyps.  I have previously recommended genetic testing given his polyp burden and family history of colon cancer but he has not done that to date.  I discussed what genetic testing is with him, implications potentially for his siblings and children and additional screening that he may need.  He is interested in pursuing this but wishes to have his next colonoscopy done first before committing to this.  He did have a positive stool test and we discussed implications of that.  Given his strong family history of colon cancer and numerous colon polyps, I would not recommend screening stool tests for him any longer.  He is already overdue for surveillance colonoscopy regardless.  We discussed colonoscopy, risks benefits of that of the procedure and anesthesia and he wishes to proceed.  His last exam was complicated by post polypectomy bleeding, hopefully risk for that moving forward is quite low.  He agrees to proceed.  Reedsburg Cellar, MD [facility]enter[facility]

## 2020-07-14 NOTE — Telephone Encounter (Signed)
Inbound call from pt stating that he didn't get his instructions for his prep. He asked if he can have them mailed to him. Please advise. Thanks.

## 2020-07-22 ENCOUNTER — Telehealth: Payer: Self-pay | Admitting: Podiatry

## 2020-07-22 NOTE — Telephone Encounter (Signed)
Diabetic shoes/inserts in [place] to be taken to [facility].. lvm for pt to call to schedule an appt in  office next available is 8.17.2022

## 2020-07-26 ENCOUNTER — Encounter: Payer: Self-pay | Admitting: Gastroenterology

## 2020-07-27 ENCOUNTER — Ambulatory Visit: Payer: Managed Care, Other (non HMO) | Admitting: Neurology

## 2020-07-27 ENCOUNTER — Encounter: Payer: Self-pay | Admitting: Neurology

## 2020-07-27 VITALS — BP 151/79 | HR 80 | Ht 74.0 in | Wt 300.5 lb

## 2020-07-27 DIAGNOSIS — H532 Diplopia: Secondary | ICD-10-CM

## 2020-07-27 DIAGNOSIS — G4730 Sleep apnea, unspecified: Secondary | ICD-10-CM

## 2020-07-27 DIAGNOSIS — L97509 Non-pressure chronic ulcer of other part of unspecified foot with unspecified severity: Secondary | ICD-10-CM

## 2020-07-27 DIAGNOSIS — E11621 Type 2 diabetes mellitus with foot ulcer: Secondary | ICD-10-CM

## 2020-07-27 DIAGNOSIS — H4922 Sixth [abducent] nerve palsy, left eye: Secondary | ICD-10-CM

## 2020-07-27 DIAGNOSIS — M869 Osteomyelitis, unspecified: Secondary | ICD-10-CM

## 2020-07-27 DIAGNOSIS — E119 Type 2 diabetes mellitus without complications: Secondary | ICD-10-CM

## 2020-07-27 DIAGNOSIS — E1169 Type 2 diabetes mellitus with other specified complication: Secondary | ICD-10-CM

## 2020-07-27 DIAGNOSIS — G4710 Hypersomnia, unspecified: Secondary | ICD-10-CM

## 2020-07-27 NOTE — Progress Notes (Signed)
Provider:  Larey Seat, MD  Primary Care Physician:  [name], [place] [address]     Referring Provider: Bethena Roys, [address] [place],  [place] [address]          Chief Complaint according to patient   Patient presents with:     New Patient (Initial Visit)     Referred by [facility], for HA and double vision. Pt sx started Friday July 15. HA are mild, dull pn. Pt is wearing an eye patch on right eye to help withhis vision. Before he would be off balance and run into things.       HISTORY OF PRESENT ILLNESS:  [name] is a 55 y.o. Caucasian male patient seen here upon urgent  referral on 07/27/2020 from Dr Dagmar Hait for a DIPLOPIA evaluation. .  Chief concern according to patient :     [name] has a past medical history of Broken ankle, Broken arm, Diabetes mellitus without complication ([place]), Diabetic retinopathy ([place]), History of Bell's palsy, Hyperlipidemia, Hypertension, and Osteomyelitis ([place]). New onset DIPLOPIA>  The patient describes that on 15 July which happened to be a Friday he woke up for some headaches not really unbearable but during the day at work he became increasingly impaired by diplopia.  The headache sensation was above the left eye frontally.  Describes a dull ache not throbbing not pulsating not sharp.  As he finished his day at work he decided to see his physician and he was seen there on 20 July and from there referred to an urgent MRI of the brain.  He had no preceding fever chills and he did not have any palpitations or chest pain there was no weakness or decreased sensation in his extremities.  He was able to clearly and alertly communicate with his doctor and PA.  He had tried part Tylenol but with very little relief an MRI of the brain with and without contrast was finally done at [facility], this was performed on 24 July at 2:17 PM so over a week since he had  onset of symptoms it showed no abnormalities except for some right mastoid effusion.  Normal dural venous sinuses, major intracranial arteries were patent .    I have sen this patient for sleep apnea in the past.  .    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   Pets are not  present. Tobacco [id number] .  ETOH use none , Caffeine intake in form of Coffee( 1-2 cups a day ) Soda( /) Tea ( when  eating out ) or energy drinks.   Review of Systems: Out of a complete 14 system review, the patient complains of only the following symptoms, and all other reviewed systems are negative.:  CPAP is comfortable.    Social History   Socioeconomic History   Marital status: Married    Spouse name: tonia   Number of children: 3   Years of education: Not on file   Highest education level: 12th grade  Occupational History   Not on file  Tobacco Use   Smoking status: Never   Smokeless tobacco: Never  Vaping Use   Vaping Use: Never used  Substance and Sexual Activity   Alcohol use: No   Drug use: No  Sexual activity: Not on file  Other Topics Concern   Not on file  Social History Narrative   Lives wih wife and 1 adopted daughter   Right handed    Caffeine: 1 cup of coffee a day   Social Determinants of Health   Financial Resource Strain: Not on file  Food Insecurity: Not on file  Transportation Needs: Not on file  Physical Activity: Not on file  Stress: Not on file  Social Connections: Not on file    Family History  Problem Relation Age of Onset   Diabetes Father    Colon cancer Father    Colon polyps Father    Esophageal cancer Neg Hx    Rectal cancer Neg Hx    Stomach cancer Neg Hx     Past Medical History:  Diagnosis Date   Broken ankle    Broken arm    Diabetes mellitus without complication ([place])    type 2   Diabetic retinopathy ([place])    PDR OU   History of  Bell's palsy    Hyperlipidemia    Hypertension    Osteomyelitis ([place])    foot    Past Surgical History:  Procedure Laterality Date   AMPUTATION Right 03/04/2015   Procedure: PARTIAL AMPUTATION RIGHT 5TH METATARSAL;  Surgeon: Landis Martins, DPM;  Location: [facility];  Service: Podiatry;  Laterality: Right;   APPENDECTOMY     COLONOSCOPY  11/17/2005   TAs - [place]   COLONOSCOPY N/A 01/25/2019   Procedure: COLONOSCOPY;  Surgeon: Juanita Craver, MD;  Location: [facility] ENDOSCOPY;  Service: Endoscopy;  Laterality: N/A;   EYE SURGERY Bilateral    blood vessels -cautery   HEMOSTASIS CLIP PLACEMENT  01/25/2019   Procedure: HEMOSTASIS CLIP PLACEMENT;  Surgeon: Juanita Craver, MD;  Location: [facility] [place];  Service: Endoscopy;;   I & D EXTREMITY Right 03/04/2015   Procedure: IRRIGATION AND DEBRIDEMENT RIGHT FOOT;  Surgeon: Landis Martins, DPM;  Location: [facility];  Service: Podiatry;  Laterality: Right;   WISDOM TOOTH EXTRACTION     only 2 ext     Current Outpatient Medications on File Prior to Visit  Medication Sig Dispense Refill   Continuous Blood Gluc Sensor (FREESTYLE LIBRE 14 DAY SENSOR) MISC SMARTSIG:1 Topical Every 2 Weeks     empagliflozin (JARDIANCE) 25 MG TABS tablet Take 25 mg by mouth daily.     fluticasone (FLONASE) 50 MCG/ACT nasal spray Place 2 sprays into both nostrils daily as needed for allergies or rhinitis.     HUMALOG KWIKPEN 200 UNIT/ML KwikPen SMARTSIG:15 Unit(s) SUB-Q 3 Times Daily     ibuprofen (ADVIL) 200 MG tablet as needed.     Insulin Degludec (TRESIBA FLEXTOUCH) 200 UNIT/ML SOPN Inject 100 Units into the skin daily before breakfast.      metFORMIN (GLUCOPHAGE) 1000 MG tablet Take 1,000 mg by mouth 2 (two) times daily.     Multiple Vitamin (MULTIVITAMIN) tablet Take 1 tablet by mouth daily.     Olmesartan-Amlodipine-HCTZ 40-10-25 MG TABS Take 1 tablet by mouth daily. Reported on 02/04/2015     omega-3 acid ethyl esters (LOVAZA) 1 g capsule Take 1 capsule by mouth 2 (two) times  daily.     oxyCODONE-acetaminophen (PERCOCET) 5-325 MG tablet Take 1 tablet by mouth every 4 (four) hours as needed for severe pain. 30 tablet 0   rosuvastatin (CRESTOR) 10 MG tablet Take 10 mg by mouth daily.     sildenafil (VIAGRA) 100 MG tablet One by mouth daily when necessary  No current facility-administered medications on file prior to visit.    Allergies  Allergen Reactions   Propofol Other (See Comments)    Caused throat muscles to relax and pt couldn't swallow    Physical exam:  Today's Vitals   07/27/20 1357  BP: (!) 151/79  Pulse: 80  Weight: (!) 300 lb 8 oz (136.3 kg)  Height: '6\' 2"'$  (1.88 m)   Body mass index is 38.58 kg/m.   Wt Readings from Last 3 Encounters:  07/27/20 (!) 300 lb 8 oz (136.3 kg)  07/14/20 299 lb (135.6 kg)  12/04/19 295 lb (133.8 kg)     Ht Readings from Last 3 Encounters:  07/27/20 '6\' 2"'$  (1.88 m)  07/14/20 6' 1.75" (1.873 m)  12/04/19 '6\' 2"'$  (1.88 m)      General: The patient is awake, alert and appears not in acute distress. The patient is well groomed. Head: Normocephalic, atraumatic. Neck is supple- no temporal indentation is  seen.  Dental status:  Cardiovascular:  Regular rate and cardiac rhythm by pulse,  without distended neck veins. Respiratory: Lungs are clear to auscultation.  Skin:  Without evidence of ankle edema, or rash. Trunk: The patient's posture is erect.   Neurologic exam : The patient is awake and alert, oriented to place and time.   Memory subjective described as intact.  Attention span & concentration ability appears normal.  Speech is fluent,  without  dysarthria, dysphonia or aphasia.  Mood and affect are appropriate.   Cranial nerves: no loss of smell or taste reported  Pupils are equal and briskly reactive to light. Funduscopic exam : The patient had bilateral evidence of retinopathy, evidence of laser surgery bilaterally..  Extraocular movements in vertical and horizontal planes were impaired  diplopia. Red lens test covering the right eye shows that with gaze variation in the horizontal plane brief horizontal diplopia widens as the left eye fails to fully move  while in left gaze- widening gap between white and red image.  The images remain next to each other not on top of each other.  It is the left  eye that is affected. Hearing was intact to soft voice and finger rubbing.    Facial sensation intact to fine touch.  Facial motor strength is symmetric and tongue and uvula move midline.  Neck ROM : rotation, tilt and flexion extension were normal for age and shoulder shrug was symmetrical.    Motor exam:  Symmetric bulk, tone and ROM.   Normal tone without cog wheeling, symmetric grip strength .   Sensory:  deferred.  Coordination: Rapid alternating movements in the fingers/hands were of normal speed.  The Finger-to-nose maneuver was intact without evidence of ataxia,.   Gait and station: deferred.       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    My Plan is to proceed with:  1) We lost power during this appointment - the usual work up is to look for risk factors of neuropathy, in his case that is clearly DM.  2) there is no need to check for myasthenia, the patient eye-muscle des not fatigue,  no Ptosis , and not likely ischemic - usually associated with a small stroke or ischemic neuropathy/ optici, that would manifest with loss of color vision. "Red loss" 3) I will see this patient later this month again, as he alreay had a sleep clinic appointment with Amy Lomax later this month. I will  take that appointment over.   I would like to thank [name], MD and Bethena Roys, [place] [place],  [place] [address] for allowing me to meet with and to take care of this pleasant patient.   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  I plan to follow up either personally  and will add some vasculitis labs on that occasion.  CC: I will share my notes with PCP> .  Electronically signed by: Larey Seat, MD 07/27/2020 2:20 PM  [facility] Board certified by The [employer] of Sleep Medicine and Diplomate of the [employer] of Sleep Medicine. Board certified In Neurology through the [place], Fellow of the [employer] of Neurology. Medical Director of [employer].

## 2020-08-02 ENCOUNTER — Ambulatory Visit: Payer: Managed Care, Other (non HMO) | Admitting: Adult Health

## 2020-08-18 ENCOUNTER — Ambulatory Visit (INDEPENDENT_AMBULATORY_CARE_PROVIDER_SITE_OTHER): Payer: Managed Care, Other (non HMO) | Admitting: Podiatry

## 2020-08-18 ENCOUNTER — Other Ambulatory Visit: Payer: Self-pay

## 2020-08-18 DIAGNOSIS — L97512 Non-pressure chronic ulcer of other part of right foot with fat layer exposed: Secondary | ICD-10-CM | POA: Diagnosis not present

## 2020-08-18 DIAGNOSIS — M898X7 Other specified disorders of bone, ankle and foot: Secondary | ICD-10-CM | POA: Diagnosis not present

## 2020-08-18 DIAGNOSIS — E0843 Diabetes mellitus due to underlying condition with diabetic autonomic (poly)neuropathy: Secondary | ICD-10-CM

## 2020-08-18 NOTE — Progress Notes (Signed)
The patient presented to the office to day to pick up diabetic shoes and 3 pr diabetic custom inserts.   1 pr of  inserts were put in the shoes and the shoes were fitted to the patient.  The patient states they are comfortable and free of defect. He was satisfied with the fit of the shoe.  Instructions for break in and wear were dispensed.  The patient signed the delivery documentation and break in instruction form.   If any questions or concerns arise, he is instructed to call.  Otherwise she will be seen back for her next scheduled appointment

## 2020-08-24 ENCOUNTER — Other Ambulatory Visit: Payer: Self-pay

## 2020-08-24 ENCOUNTER — Ambulatory Visit: Payer: Managed Care, Other (non HMO)

## 2020-08-24 ENCOUNTER — Ambulatory Visit: Payer: Managed Care, Other (non HMO) | Admitting: Podiatry

## 2020-08-24 DIAGNOSIS — E0843 Diabetes mellitus due to underlying condition with diabetic autonomic (poly)neuropathy: Secondary | ICD-10-CM

## 2020-08-24 DIAGNOSIS — R234 Changes in skin texture: Secondary | ICD-10-CM

## 2020-08-24 NOTE — Progress Notes (Signed)
   HPI: 55 y.o. male presenting today PMHx DM type II for evaluation of a breakin the skin of his left foot. Patient noticed it over the past week and his wife was concerned. He presents for further treatment and evaluation.   Past Medical History:  Diagnosis Date   Broken ankle    Broken arm    Diabetes mellitus without complication ([place])    type 2   Diabetic retinopathy ([place])    PDR OU   History of Bell's palsy    Hyperlipidemia    Hypertension    Osteomyelitis ([place])    foot     Physical Exam: General: The patient is alert and oriented x3 in no acute distress.  Dermatology: There is a very superficial fissure at the skin to the plantar aspect of the left forefoot approximately 1.5 cm in length.  There is no clinical evidence of infection.  No drainage or erythema around the area.  No malodor noted.  The fissure appears very superficial in nature.  Vascular: Palpable pedal pulses bilaterally. No edema or erythema noted. Capillary refill within normal limits.  Neurological: Epicritic and protective threshold grossly intact bilaterally.   Musculoskeletal Exam: No pedal deformity noted   Assessment: 1.  Fissure of skin left forefoot 2.  Diabetes mellitus with peripheral polyneuropathy   Plan of Care:  1. Patient evaluated.  2.  Recommend Silvadene cream and light dressing daily.  Silvadene cream provided 3.  Continue diabetic shoes and insoles 4.  At the moment there is nothing concerning or alarming.  The fissure of the skin is very superficial in nature and there is no evidence of infection.  Hopefully a light antibiotic cream with a dressing will resolve the issue 5.  Return to clinic as needed, or if the wound worsens      Edrick Kins, DPM [facility]  Dr. Edrick Kins, DPM    [address], [place] [address]                Office [phone]  Fax [phone]

## 2020-09-07 ENCOUNTER — Ambulatory Visit: Payer: Managed Care, Other (non HMO) | Admitting: Adult Health

## 2020-09-07 ENCOUNTER — Encounter: Payer: Self-pay | Admitting: Adult Health

## 2020-09-07 VITALS — BP 145/80 | HR 74 | Ht 74.0 in | Wt 304.0 lb

## 2020-09-07 DIAGNOSIS — G4733 Obstructive sleep apnea (adult) (pediatric): Secondary | ICD-10-CM

## 2020-09-07 DIAGNOSIS — H532 Diplopia: Secondary | ICD-10-CM | POA: Diagnosis not present

## 2020-09-07 DIAGNOSIS — Z9989 Dependence on other enabling machines and devices: Secondary | ICD-10-CM | POA: Diagnosis not present

## 2020-09-07 NOTE — Patient Instructions (Signed)
Continue using CPAP nightly and greater than 4 hours each night °If your symptoms worsen or you develop new symptoms please let us know.  ° °

## 2020-09-07 NOTE — Progress Notes (Signed)
PATIENT: [name] DOB: 08-29-1965  REASON FOR VISIT: follow up HISTORY FROM: patient PRIMARY NEUROLOGIST: Dr. Brett Fairy  HISTORY OF PRESENT ILLNESS: Today 09/07/20:  [name] is a 55 year old male with a history of obstructive sleep apnea on CPAP.  He reports that the CPAP is working well for him he denies any new issues.  His download is below.  Patient continues to have double vision.  He wears a eye patch over the right eye.  He states that the double vision is usually horizontal.  Reports that if he puts the eye patch over the left eye he can see clearly in the right eye.  He states that it may have gotten slightly better over the last 2 months but not a big improvement.  His glucose numbers have been in normal range per the patient.  His last hemoglobin A1c was 7.2.    HISTORY  (Copied from Dr.Dohmeier's note) The patient describes that on 15 July which happened to be a Friday he woke up for some headaches not really unbearable but during the day at work he became increasingly impaired by diplopia.  The headache sensation was above the left eye frontally.  Describes a dull ache not throbbing not pulsating not sharp.  As he finished his day at work he decided to see his physician and he was seen there on 20 July and from there referred to an urgent MRI of the brain.  He had no preceding fever chills and he did not have any palpitations or chest pain there was no weakness or decreased sensation in his extremities.  He was able to clearly and alertly communicate with his doctor and PA.  He had tried part Tylenol but with very little relief an MRI of the brain with and without contrast was finally done at [facility], this was performed on 24 July at 2:17 PM so over a week since he had onset of symptoms it showed no abnormalities except for some right mastoid effusion.  Normal dural venous sinuses, major intracranial arteries were patent .      I have  sen this patient for sleep apnea in the past.  .    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   Pets are not  present. Tobacco [id number] .  ETOH use none , Caffeine intake in form of Coffee( 1-2 cups a day ) Soda( /) Tea ( when  eating out ) or energy drinks.    REVIEW OF SYSTEMS: Out of a complete 14 system review of symptoms, the patient complains only of the following symptoms, and all other reviewed systems are negative.  FSS 25 ESS 5  ALLERGIES: Allergies  Allergen Reactions   Propofol Other (See Comments)    Caused throat muscles to relax and pt couldn't swallow    HOME MEDICATIONS: Outpatient Medications Prior to Visit  Medication Sig Dispense Refill   Continuous Blood Gluc Sensor (FREESTYLE LIBRE 14 DAY SENSOR) MISC SMARTSIG:1 Topical Every 2 Weeks     empagliflozin (JARDIANCE) 25 MG TABS tablet Take 25 mg by mouth daily.     fluticasone (FLONASE) 50 MCG/ACT nasal spray Place 2 sprays into both nostrils daily as needed for allergies or rhinitis.     HUMALOG KWIKPEN 200 UNIT/ML KwikPen SMARTSIG:15 Unit(s) SUB-Q 3 Times Daily     ibuprofen (ADVIL) 200 MG  tablet as needed.     Insulin Degludec (TRESIBA FLEXTOUCH) 200 UNIT/ML SOPN Inject 100 Units into the skin daily before breakfast.      metFORMIN (GLUCOPHAGE) 1000 MG tablet Take 1,000 mg by mouth 2 (two) times daily.     Multiple Vitamin (MULTIVITAMIN) tablet Take 1 tablet by mouth daily.     Olmesartan-Amlodipine-HCTZ 40-10-25 MG TABS Take 1 tablet by mouth daily. Reported on 02/04/2015     omega-3 acid ethyl esters (LOVAZA) 1 g capsule Take 1 capsule by mouth 2 (two) times daily.     oxyCODONE-acetaminophen (PERCOCET) 5-325 MG tablet Take 1 tablet by mouth every 4 (four) hours as needed for severe pain. 30 tablet 0   rosuvastatin (CRESTOR) 10 MG tablet Take 10 mg by mouth daily.     sildenafil (VIAGRA) 100 MG tablet One  by mouth daily when necessary     No facility-administered medications prior to visit.    PAST MEDICAL HISTORY: Past Medical History:  Diagnosis Date   Broken ankle    Broken arm    Diabetes mellitus without complication ([place])    type 2   Diabetic retinopathy ([place])    PDR OU   History of Bell's palsy    Hyperlipidemia    Hypertension    Osteomyelitis ([place])    foot    PAST SURGICAL HISTORY: Past Surgical History:  Procedure Laterality Date   AMPUTATION Right 03/04/2015   Procedure: PARTIAL AMPUTATION RIGHT 5TH METATARSAL;  Surgeon: Landis Martins, DPM;  Location: [place];  Service: Podiatry;  Laterality: Right;   APPENDECTOMY     COLONOSCOPY  11/17/2005   TAs - [place]   COLONOSCOPY N/A 01/25/2019   Procedure: COLONOSCOPY;  Surgeon: Juanita Craver, MD;  Location: [facility] ENDOSCOPY;  Service: Endoscopy;  Laterality: N/A;   EYE SURGERY Bilateral    blood vessels -cautery   HEMOSTASIS CLIP PLACEMENT  01/25/2019   Procedure: HEMOSTASIS CLIP PLACEMENT;  Surgeon: Juanita Craver, MD;  Location: [place];  Service: Endoscopy;;   I & D EXTREMITY Right 03/04/2015   Procedure: IRRIGATION AND DEBRIDEMENT RIGHT FOOT;  Surgeon: Landis Martins, DPM;  Location: [place];  Service: Podiatry;  Laterality: Right;   WISDOM TOOTH EXTRACTION     only 2 ext    FAMILY HISTORY: Family History  Problem Relation Age of Onset   Diabetes Father    Colon cancer Father    Colon polyps Father    Esophageal cancer Neg Hx    Rectal cancer Neg Hx    Stomach cancer Neg Hx    Sleep apnea Neg Hx     SOCIAL HISTORY: Social History   Socioeconomic History   Marital status: Married    Spouse name: tonia   Number of children: 3   Years of education: Not on file   Highest education level: 12th grade  Occupational History   Not on file  Tobacco Use   Smoking status: Never   Smokeless tobacco: Never  Vaping Use   Vaping Use: Never used  Substance and Sexual Activity   Alcohol use: No   Drug use: No    Sexual activity: Not on file  Other Topics Concern   Not on file  Social History Narrative   Lives wih wife and 1 adopted daughter   Right handed    Caffeine: 1 cup of coffee a day   Social Determinants of Health   Financial Resource Strain: Not on file  Food Insecurity: Not on file  Transportation Needs: Not on  file  Physical Activity: Not on file  Stress: Not on file  Social Connections: Not on file  Intimate Partner Violence: Not on file      PHYSICAL EXAM  Vitals:   09/07/20 0759  BP: (!) 145/80  Pulse: 74  Weight: (!) 304 lb (137.9 kg)  Height: '6\' 2"'$  (1.88 m)   Body mass index is 39.03 kg/m.  Generalized: Well developed, in no acute distress  Chest: Lungs clear to auscultation bilaterally  Neurological examination  Mentation: Alert oriented to time, place, history taking. Follows all commands speech and language fluent Cranial nerve II-XII: Extraocular movements were full, visual field were full on confrontational test Head turning and shoulder shrug  were normal and symmetric.  Superior gaze held for 1 minute-diplopia noted before and after.  Ptosis not present.  Motor: The motor testing reveals 5 over 5 strength of all 4 extremities. Good symmetric motor tone is noted throughout.  Arms abducted for 1 minute no weakness noted after that minute Sensory: Sensory testing is intact to soft touch on all 4 extremities. No evidence of extinction is noted.  Gait and station: Gait is normal.    DIAGNOSTIC DATA (LABS, IMAGING, TESTING) - I reviewed patient records, labs, notes, testing and imaging myself where available.  Lab Results  Component Value Date   WBC 9.4 12/04/2019   HGB 14.4 12/04/2019   HCT 45.3 12/04/2019   MCV 84.8 12/04/2019   PLT 282 12/04/2019      Component Value Date/Time   NA 136 12/04/2019 0917   K 4.0 12/04/2019 0917   CL 101 12/04/2019 0917   CO2 23 12/04/2019 0917   GLUCOSE 165 (H) 12/04/2019 0917   BUN 23 (H) 12/04/2019 0917    CREATININE 1.08 12/04/2019 0917   CALCIUM 8.9 12/04/2019 0917   PROT 7.0 12/04/2019 0917   ALBUMIN 3.6 12/04/2019 0917   AST 18 12/04/2019 0917   ALT 19 12/04/2019 0917   ALKPHOS 43 12/04/2019 0917   BILITOT 0.5 12/04/2019 0917   GFRNONAA >60 12/04/2019 0917   GFRAA >60 01/26/2019 0536   No results found for: CHOL, HDL, LDLCALC, LDLDIRECT, TRIG, CHOLHDL Lab Results  Component Value Date   HGBA1C 8.0 (H) 01/25/2019   No results found for: VITAMINB12 No results found for: TSH    ASSESSMENT AND PLAN 55 y.o. year old male  has a past medical history of Broken ankle, Broken arm, Diabetes mellitus without complication ([place]), Diabetic retinopathy ([place]), History of Bell's palsy, Hyperlipidemia, Hypertension, and Osteomyelitis ([place]). here with:  OSA on CPAP  - CPAP compliance excellent - Good treatment of AHI  - Encourage patient to use CPAP nightly and > 4 hours each night  2.  Diplopia   -Slight improvement per patient -Physical exam does not support myasthenia gravis -Patient reports that glucose numbers have been in normal range -Advised that I will discuss with Dr. Brett Fairy   - F/U in 3 months or sooner if needed     Ward Givens, MSN, NP-C 09/07/2020, 8:03 AM [facility] [address], [place], [place] [address] [phone]

## 2020-09-15 ENCOUNTER — Other Ambulatory Visit: Payer: Self-pay | Admitting: *Deleted

## 2020-09-15 ENCOUNTER — Encounter: Payer: Self-pay | Admitting: *Deleted

## 2020-09-17 ENCOUNTER — Other Ambulatory Visit: Payer: Self-pay

## 2020-09-17 ENCOUNTER — Encounter: Payer: Self-pay | Admitting: Gastroenterology

## 2020-09-17 ENCOUNTER — Ambulatory Visit (AMBULATORY_SURGERY_CENTER): Payer: Managed Care, Other (non HMO) | Admitting: Gastroenterology

## 2020-09-17 VITALS — BP 135/73 | HR 69 | Temp 97.7°F | Resp 16 | Ht 73.0 in | Wt 299.0 lb

## 2020-09-17 DIAGNOSIS — Z86010 Personal history of colonic polyps: Secondary | ICD-10-CM

## 2020-09-17 DIAGNOSIS — D122 Benign neoplasm of ascending colon: Secondary | ICD-10-CM

## 2020-09-17 DIAGNOSIS — D124 Benign neoplasm of descending colon: Secondary | ICD-10-CM | POA: Diagnosis not present

## 2020-09-17 DIAGNOSIS — D123 Benign neoplasm of transverse colon: Secondary | ICD-10-CM

## 2020-09-17 DIAGNOSIS — Z800 Family history of malignant neoplasm of digestive organs: Secondary | ICD-10-CM

## 2020-09-17 DIAGNOSIS — R195 Other fecal abnormalities: Secondary | ICD-10-CM

## 2020-09-17 DIAGNOSIS — D125 Benign neoplasm of sigmoid colon: Secondary | ICD-10-CM

## 2020-09-17 MED ORDER — SODIUM CHLORIDE 0.9 % IV SOLN: 500.0000 mL | Freq: Once | INTRAVENOUS | Status: DC

## 2020-09-17 NOTE — Progress Notes (Signed)
[facility] History and Physical   Primary Care Physician:  [name], MD   Reason for Procedure:   History of colon polyps, heme positive stool  Plan:    colonoscopy     HPI: [name] is a 55 y.o. male  here for colonoscopy surveillance. Strong family history of colon cancer. Numerous polyps over recent years - 30 adenomas, some advanced. Patient denies any bowel symptoms at this time. Last exam 01/2019.Otherwise feels well without any cardiopulmonary symptoms.    Past Medical History:  Diagnosis Date   Broken ankle    Broken arm    Diabetes mellitus without complication ([place])    type 2   Diabetic retinopathy ([place])    PDR OU   History of Bell's palsy    Hyperlipidemia    Hypertension    Osteomyelitis ([place])    foot   Sleep apnea    wears CPAP    Past Surgical History:  Procedure Laterality Date   AMPUTATION Right 03/04/2015   Procedure: PARTIAL AMPUTATION RIGHT 5TH METATARSAL;  Surgeon: Landis Martins, DPM;  Location: [place];  Service: Podiatry;  Laterality: Right;   APPENDECTOMY     COLONOSCOPY  11/17/2005   TAs - [facility]   COLONOSCOPY N/A 01/25/2019   Procedure: COLONOSCOPY;  Surgeon: Juanita Craver, MD;  [facility] ENDOSCOPY;  Service: Endoscopy;  Laterality: N/A;   EYE SURGERY Bilateral    blood vessels -cautery   HEMOSTASIS CLIP PLACEMENT  01/25/2019   Procedure: HEMOSTASIS CLIP PLACEMENT;  Surgeon: Juanita Craver, MD;  Location: [place];  Service: Endoscopy;;   I & D EXTREMITY Right 03/04/2015   Procedure: IRRIGATION AND DEBRIDEMENT RIGHT FOOT;  Surgeon: Landis Martins, DPM;  Location: [place];  Service: Podiatry;  Laterality: Right;   WISDOM TOOTH EXTRACTION     only 2 ext    Prior to Admission medications   Medication Sig Start Date End Date Taking? Authorizing Provider  Continuous Blood Gluc Sensor (FREESTYLE LIBRE 14 DAY SENSOR) MISC SMARTSIG:1 Topical Every 2 Weeks 06/20/19  Yes [provider]  empagliflozin  (JARDIANCE) 25 MG TABS tablet Take 25 mg by mouth daily.   Yes [provider]  HUMALOG KWIKPEN 200 UNIT/ML KwikPen SMARTSIG:15 Unit(s) SUB-Q 3 Times Daily 01/06/20  Yes [provider]  Insulin Degludec (TRESIBA FLEXTOUCH) 200 UNIT/ML SOPN Inject 100 Units into the skin daily before breakfast.    Yes [provider]  metFORMIN (GLUCOPHAGE) 1000 MG tablet Take 1,000 mg by mouth 2 (two) times daily. 11/09/14  Yes [provider]  Multiple Vitamin (MULTIVITAMIN) tablet Take 1 tablet by mouth daily.   Yes [provider]  Olmesartan-Amlodipine-HCTZ 40-10-25 MG TABS Take 1 tablet by mouth daily. Reported on 02/04/2015 12/28/14  Yes [provider]  omega-3 acid ethyl esters (LOVAZA) 1 g capsule Take 1 capsule by mouth 2 (two) times daily.   Yes [provider]  rosuvastatin (CRESTOR) 10 MG tablet Take 10 mg by mouth daily. 01/03/19  Yes [provider]  fluticasone (FLONASE) 50 MCG/ACT nasal spray Place 2 sprays into both nostrils daily as needed for allergies or rhinitis.    [provider]  ibuprofen (ADVIL) 200 MG tablet as needed. 12/24/08   [provider]  oxyCODONE-acetaminophen (PERCOCET) 5-325 MG tablet Take 1 tablet by mouth every 4 (four) hours as needed for severe pain. 02/12/20   Edrick Kins, DPM  sildenafil (VIAGRA) 100 MG tablet One by mouth daily when necessary 04/18/17   [provider]    Current Outpatient Medications  Medication Sig Dispense Refill   Continuous Blood Gluc Sensor (FREESTYLE LIBRE 14 DAY SENSOR) MISC SMARTSIG:1 Topical Every 2 Weeks     empagliflozin (JARDIANCE) 25 MG TABS tablet Take 25 mg by mouth daily.     HUMALOG KWIKPEN 200 UNIT/ML KwikPen SMARTSIG:15 Unit(s) SUB-Q 3 Times Daily     Insulin Degludec (TRESIBA FLEXTOUCH) 200 UNIT/ML SOPN Inject 100 Units into the skin daily before breakfast.      metFORMIN (GLUCOPHAGE) 1000 MG tablet Take 1,000 mg by mouth 2 (two) times  daily.     Multiple Vitamin (MULTIVITAMIN) tablet Take 1 tablet by mouth daily.     Olmesartan-Amlodipine-HCTZ 40-10-25 MG TABS Take 1 tablet by mouth daily. Reported on 02/04/2015     omega-3 acid ethyl esters (LOVAZA) 1 g capsule Take 1 capsule by mouth 2 (two) times daily.     rosuvastatin (CRESTOR) 10 MG tablet Take 10 mg by mouth daily.     fluticasone (FLONASE) 50 MCG/ACT nasal spray Place 2 sprays into both nostrils daily as needed for allergies or rhinitis.     ibuprofen (ADVIL) 200 MG tablet as needed.     oxyCODONE-acetaminophen (PERCOCET) 5-325 MG tablet Take 1 tablet by mouth every 4 (four) hours as needed for severe pain. 30 tablet 0   sildenafil (VIAGRA) 100 MG tablet One by mouth daily when necessary     Current Facility-Administered Medications  Medication Dose Route Frequency Provider Last Rate Last Admin   0.9 %  sodium chloride infusion  500 mL Intravenous Once Mariluz Crespo, Carlota Raspberry, MD        Allergies as of 09/17/2020 - Review Complete 09/17/2020  Allergen Reaction Noted   Robinul [glycopyrrolate]  09/15/2020    Family History  Problem Relation Age of Onset   Diabetes Father    Colon cancer Father    Colon polyps Father    Esophageal cancer Neg Hx    Rectal cancer Neg Hx    Stomach cancer Neg Hx    Sleep apnea Neg Hx     Social History   Socioeconomic History   Marital status: Married    Spouse name: tonia   Number of children: 3   Years of education: Not on file   Highest education level: 12th grade  Occupational History   Not on file  Tobacco Use   Smoking status: Never   Smokeless tobacco: Never  Vaping Use   Vaping Use: Never used  Substance and Sexual Activity   Alcohol use: No   Drug use: No   Sexual activity: Not on file  Other Topics Concern   Not on file  Social History Narrative   Lives wih wife and 1 adopted daughter   Right handed    Caffeine: 1 cup of coffee a day   Social Determinants of Health   Financial Resource Strain: Not  on file  Food Insecurity: Not on file  Transportation Needs: Not on file  Physical Activity: Not on file  Stress: Not on file  Social Connections: Not on file  Intimate Partner Violence: Not on file    Review of Systems: All other review of systems negative except as mentioned in the HPI.  Physical Exam: Vital signs BP 139/81   Pulse 74   Temp 97.7 F (36.5 C)   Resp 16   Ht '6\' 1"'$  (1.854 m)   Wt 299 lb (135.6 kg)   SpO2 99%   BMI 39.45 kg/m  General:   Alert,  Well-developed, well-nourished, pleasant and cooperative in NAD Lungs:  Clear throughout to auscultation.   Heart:  Regular rate and rhythm Abdomen:  Soft, nontender and nondistended.   Neuro/Psych:  Alert and cooperative. Normal mood and affect. A and O x 3  Jolly Mango, MD [facility]

## 2020-09-17 NOTE — Progress Notes (Signed)
Report given to PACU, vss 

## 2020-09-17 NOTE — Patient Instructions (Signed)
6 polyps removed- see handout  Continue your normal medications  Please read handouts about polyps and hemorrhoids  Await pathology results- anticipate next colonoscopy in 3 years Dr. Doyne Keel office will call you to set up genetic testing   YOU HAD AN ENDOSCOPIC PROCEDURE TODAY AT [place]:   Refer to the procedure report that was given to you for any specific questions about what was found during the examination.  If the procedure report does not answer your questions, please call your gastroenterologist to clarify.  If you requested that your care partner not be given the details of your procedure findings, then the procedure report has been included in a sealed envelope for you to review at your convenience later.  YOU SHOULD EXPECT: Some feelings of bloating in the abdomen. Passage of more gas than usual.  Walking can help get rid of the air that was put into your GI tract during the procedure and reduce the bloating. If you had a lower endoscopy (such as a colonoscopy or flexible sigmoidoscopy) you may notice spotting of blood in your stool or on the toilet paper. If you underwent a bowel prep for your procedure, you may not have a normal bowel movement for a few days.  Please Note:  You might notice some irritation and congestion in your nose or some drainage.  This is from the oxygen used during your procedure.  There is no need for concern and it should clear up in a day or so.  SYMPTOMS TO REPORT IMMEDIATELY:  Following lower endoscopy (colonoscopy or flexible sigmoidoscopy):  Excessive amounts of blood in the stool  Significant tenderness or worsening of abdominal pains  Swelling of the abdomen that is new, acute  Fever of 100F or higher  For urgent or emergent issues, a gastroenterologist can be reached at any hour by calling [phone]. Do not use MyChart messaging for urgent concerns.    DIET:  We do recommend a small meal at first, but then you  may proceed to your regular diet.  Drink plenty of fluids but you should avoid alcoholic beverages for 24 hours.  ACTIVITY:  You should plan to take it easy for the rest of today and you should NOT DRIVE or use heavy machinery until tomorrow (because of the sedation medicines used during the test).    FOLLOW UP: Our staff will call the number listed on your records 48-72 hours following your procedure to check on you and address any questions or concerns that you may have regarding the information given to you following your procedure. If we do not reach you, we will leave a message.  We will attempt to reach you two times.  During this call, we will ask if you have developed any symptoms of COVID 19. If you develop any symptoms (ie: fever, flu-like symptoms, shortness of breath, cough etc.) before then, please call [phone].  If you test positive for Covid 19 in the 2 weeks post procedure, please call and report this information to [place].    If any biopsies were taken you will be contacted by phone or by letter within the next 1-3 weeks.  Please call us at [phone] if you have not heard about the biopsies in 3 weeks.    SIGNATURES/CONFIDENTIALITY: You and/or your care partner have signed paperwork which will be entered into your electronic medical record.  These signatures attest to the fact that that the information above on your After Visit Summary has  been reviewed and is understood.  Full responsibility of the confidentiality of this discharge information lies with you and/or your care-partner.

## 2020-09-17 NOTE — Progress Notes (Signed)
Called to room to assist during endoscopic procedure.  Patient ID and intended procedure confirmed with present staff. Received instructions for my participation in the procedure from the performing physician.  

## 2020-09-17 NOTE — Progress Notes (Signed)
Data will not transfer from monitor, vs being posted manually.   

## 2020-09-17 NOTE — Op Note (Signed)
[name] Patient Name: [name] Procedure Date: 09/17/2020 3:25 PM MRN: [id number] Endoscopist: Remo Lipps P. Havery Moros , MD Age: 55 Referring MD:  Date of Birth: [birth year] Gender: Male Account #: [id number] Procedure:                Colonoscopy Indications:              High risk colon cancer surveillance: Personal                            history of colonic polyps - history of > 30                            adenomas on last 2 exams, including advanced                            adenomas. Father CRC age 65s. Last exam 01/2019. Medicines:                Monitored Anesthesia Care Procedure:                Pre-Anesthesia Assessment:                           - Prior to the procedure, a History and Physical                            was performed, and patient medications and                            allergies were reviewed. The patient's tolerance of                            previous anesthesia was also reviewed. The risks                            and benefits of the procedure and the sedation                            options and risks were discussed with the patient.                            All questions were answered, and informed consent                            was obtained. Prior Anticoagulants: The patient has                            taken no previous anticoagulant or antiplatelet                            agents. ASA Grade Assessment: II - A patient with                            mild systemic disease. After reviewing the risks  and benefits, the patient was deemed in                            satisfactory condition to undergo the procedure.                           After obtaining informed consent, the colonoscope                            was passed under direct vision. Throughout the                            procedure, the patient's blood pressure, pulse, and                            oxygen saturations  were monitored continuously. The                            CF [id number] was introduced through the anus                            and advanced to the the cecum, identified by                            appendiceal orifice and ileocecal valve. The                            colonoscopy was performed without difficulty. The                            patient tolerated the procedure well. The quality                            of the bowel preparation was adequate. The                            ileocecal valve, appendiceal orifice, and rectum                            were photographed. Scope In: 3:43:59 PM Scope Out: 4:16:30 PM Scope Withdrawal Time: 0 hours 26 minutes 24 seconds  Total Procedure Duration: 0 hours 32 minutes 31 seconds  Findings:                 The perianal and digital rectal examinations were                            normal.                           A 3 to 4 mm polyp was found in the ascending colon.                            The polyp was sessile. The polyp was removed with a  cold snare. Resection and retrieval were complete.                           A tattoo was seen in the ascending colon with prior                            polypectomy scar which looked good.                           A 4 mm polyp was found in the transverse colon. The                            polyp was sessile. The polyp was removed with a                            cold snare. Resection and retrieval were complete.                           A 4 mm polyp was found in the descending colon. The                            polyp was sessile. The polyp was removed with a                            cold snare. Resection and retrieval were complete.                           Three sessile polyps were found in the sigmoid                            colon. The polyps were 3 to 6 mm in size. These                            polyps were removed with a cold snare.  Resection                            and retrieval were complete.                           Internal hemorrhoids were found during retroflexion.                           Anal papilla(e) were hypertrophied.                           The colon was tortuous and quite spastic which                            prolonged the exam.                           The exam was otherwise without abnormality. Complications:            No immediate  complications. Estimated blood loss:                            Minimal. Estimated Blood Loss:     Estimated blood loss was minimal. Impression:               - One 3 to 4 mm polyp in the ascending colon,                            removed with a cold snare. Resected and retrieved.                           - A tattoo was seen in the ascending colon.                           - One 4 mm polyp in the transverse colon, removed                            with a cold snare. Resected and retrieved.                           - One 4 mm polyp in the descending colon, removed                            with a cold snare. Resected and retrieved.                           - Three 3 to 6 mm polyps in the sigmoid colon,                            removed with a cold snare. Resected and retrieved.                           - Internal hemorrhoids.                           - Anal papilla(e) were hypertrophied.                           - Tortuous / spastic colon.                           - The examination was otherwise normal. Recommendation:           - Patient has a contact number available for                            emergencies. The signs and symptoms of potential                            delayed complications were discussed with the                            patient. Return to normal activities tomorrow.  Written discharge instructions were provided to the                            patient.                           - Resume previous  diet.                           - Continue present medications.                           - Await pathology results. Anticipate repeat                            colonsocopy in 3 years.                           - Genetic testing recommended to assess for                            polyposis syndrome Carlota Raspberry. Xan Ingraham, MD 09/17/2020 4:23:39 PM This report has been signed electronically.

## 2020-09-17 NOTE — Progress Notes (Signed)
[name]   Pt drank 8 ounces of water without issues in the RR

## 2020-09-21 ENCOUNTER — Telehealth: Payer: Self-pay

## 2020-09-21 NOTE — Telephone Encounter (Signed)
  Follow up Call-  Call back number 09/17/2020 01/17/2019  Post procedure Call Back phone  # [phone]  Permission to leave phone message Yes Yes  Some recent data might be hidden     Patient questions:  Do you have a fever, pain , or abdominal swelling? No. Pain Score  0 *  Have you tolerated food without any problems? Yes.    Have you been able to return to your normal activities? Yes.    Do you have any questions about your discharge instructions: Diet   No. Medications  No. Follow up visit  No.  Do you have questions or concerns about your Care? No.  Actions: * If pain score is 4 or above: No action needed, pain <4.

## 2020-09-23 ENCOUNTER — Other Ambulatory Visit: Payer: Self-pay

## 2020-09-23 DIAGNOSIS — Z86010 Personal history of colonic polyps: Secondary | ICD-10-CM

## 2020-09-23 DIAGNOSIS — Z800 Family history of malignant neoplasm of digestive organs: Secondary | ICD-10-CM

## 2020-09-24 ENCOUNTER — Telehealth: Payer: Self-pay | Admitting: Genetic Counselor

## 2020-09-24 NOTE — Telephone Encounter (Signed)
Scheduled appt per 9/22 referral. Pt is aware of appt date and time.

## 2020-10-11 ENCOUNTER — Telehealth: Payer: Self-pay | Admitting: Genetic Counselor

## 2020-10-11 NOTE — Telephone Encounter (Signed)
R/s pt's genetic counseling appt per pt request. Pt is aware of new appt date and time.

## 2020-10-12 ENCOUNTER — Inpatient Hospital Stay: Payer: Managed Care, Other (non HMO)

## 2020-10-12 ENCOUNTER — Inpatient Hospital Stay: Payer: Managed Care, Other (non HMO) | Admitting: Genetic Counselor

## 2020-11-02 ENCOUNTER — Inpatient Hospital Stay: Payer: Managed Care, Other (non HMO) | Admitting: Genetic Counselor

## 2020-11-02 ENCOUNTER — Inpatient Hospital Stay: Payer: Managed Care, Other (non HMO)

## 2020-12-14 ENCOUNTER — Ambulatory Visit: Payer: Managed Care, Other (non HMO) | Admitting: Neurology

## 2020-12-14 ENCOUNTER — Encounter: Payer: Self-pay | Admitting: Neurology

## 2020-12-14 VITALS — BP 157/71 | HR 74 | Ht 74.0 in | Wt 309.0 lb

## 2020-12-14 DIAGNOSIS — G4733 Obstructive sleep apnea (adult) (pediatric): Secondary | ICD-10-CM | POA: Diagnosis not present

## 2020-12-14 DIAGNOSIS — G4701 Insomnia due to medical condition: Secondary | ICD-10-CM

## 2020-12-14 DIAGNOSIS — Z9989 Dependence on other enabling machines and devices: Secondary | ICD-10-CM | POA: Diagnosis not present

## 2020-12-14 DIAGNOSIS — G8929 Other chronic pain: Secondary | ICD-10-CM

## 2020-12-14 NOTE — Patient Instructions (Signed)

## 2020-12-14 NOTE — Progress Notes (Signed)
Provider:  Larey Seat, MD  Primary Care Physician:  [name], [place] [address]     Referring Provider: [name], [address] [place],  [place] [address]          Chief Complaint according to patient   Patient presents with:     New Patient (Initial Visit)           [name] OF PRESENT ILLNESS:  [name] is a 55 y.o. Caucasian male patient seen here upon urgent  referral on 12/14/2020 : [name] is here for a routine revisit related to CPAP compliance he is using an autotitrator with a minimum pressure setting of 6 maximum of 18 cmH2O [name] 2 cm EPR also known as expiratory pressure relief.  His average user time is 3 hours [name] 59 minutes he falls 1 minute short of the 4-hour mark.  He has used the machine 28 out of 30 days.  He reports that it is low back pain that keeps him up at night makes it uncomfortable interrupts his sleep [name] therefore interferes with his compliance.  His residual AHI on CPAP is 2.0/h.  The 95th percentile pressure is only 8 cmH2O [name] there is a moderate air leak.  No central apneas are arising.  So I do not think there is any change with the settings necessary but a overall approach to his sleep quality which is hampered by pain.  He endorsed the Epworth sleepiness score at 5 points [name] fatigue severity was not endorsed at all.  I would like for [name] to have a lower back pain evaluation with either a physical therapist or a rehab first met physician.  I will suggest this to [name] his primary care physician.  I do not suggest that pain medication is the solution here but that a gradual therapeutic approach to limit the frequency of low back spasms with PT should be chosen. He has no longer foot pain [name] no longer diplopia, DM much better controlled.no cardiac palpitations.no morning headaches, nocturia 2-3 times still.     04-08-2019. Original referral from Dr [name] for a DIPLOPIA  evaluation.  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 New onset DIPLOPIA>  The patient describes that on 15 July which happened to be a Friday he woke up for some headaches not really unbearable but during the day at work he became increasingly impaired by diplopia.  The headache sensation was above the left eye frontally.  Describes a dull ache not throbbing not pulsating not sharp.  As he finished his day at work he decided to see his physician [name] he was seen there on 20 July [name] from there referred to an urgent MRI of the brain.  He had no preceding fever chills [name] he did not have any palpitations or chest pain there was no weakness or decreased sensation in his extremities.  He was able to clearly [name] alertly communicate with his doctor and PA.  He had tried part Tylenol but with very little relief an MRI of the brain with [name] without contrast was finally done at [facility], this was performed on 24 July at 2:17 PM so over a week since he had onset  of symptoms it showed no abnormalities except for some right mastoid effusion.  Normal dural venous sinuses, major intracranial arteries were patent .    I have sen this patient for [name] in the past.  .    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   Pets are not  present. Tobacco OMB:TDHR .  ETOH use none , Caffeine intake in form of Coffee( 1-2 cups a day ) Soda( /) Tea ( when  eating out ) or energy drinks.   Review of Systems: Out of a complete 14 system review, the patient complains of only the following symptoms, [name] all other reviewed systems are negative.:  CPAP is  comfortable.   How likely are you to doze in the following situations: 0 = not likely, 1 = slight chance, 2 = moderate chance, 3 = high chance  Sitting [name] Reading? Watching Television? Sitting inactive in a public place (theater or meeting)? Lying down in the afternoon when circumstances permit? Sitting [name] talking to someone? Sitting quietly after lunch without alcohol? In a car, while stopped for a few minutes in traffic? As a passenger in a car for an hour without a break?  Total = 5/ 24 points.     Social [name]   Socioeconomic [name]   Marital status: Married    Spouse name: [name]   Number of children: 3   Years of education: Not on file   Highest education level: 12th grade  Occupational [name]   Not on file  Tobacco Use   Smoking status: Never   Smokeless tobacco: Never  Vaping Use   Vaping Use: Never used  Substance [name] Sexual Activity   Alcohol use: No   Drug use: No   Sexual activity: Not on file  Other Topics Concern   Not on file  Social [name] Narrative   Lives wih wife [name] 1 adopted daughter   Right handed    Caffeine: 1 cup of coffee a day   Social Determinants of Radio broadcast assistant Strain: Not on file  Food Insecurity: Not on file  Transportation Needs: Not on file  Physical Activity: Not on file  Stress: Not on file  Social Connections: Not on file    Family [name]  Problem Relation Age of Onset   [name] Father    Colon cancer Father    Colon polyps Father    Esophageal cancer Neg Hx    Rectal cancer Neg Hx    Stomach cancer Neg Hx    [name] Neg Hx     Past Medical [name]:  Diagnosis Date   [name]    [name]    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   [name]    [name]    [name]    [name] ([place])    foot   [name]    wears CPAP    Past Surgical [name]:  Procedure Laterality Date   AMPUTATION  Right 03/04/2015   Procedure: PARTIAL AMPUTATION RIGHT 5TH METATARSAL;  Surgeon: Landis Martins, DPM;  Location: [facility];  Service: Podiatry;  Laterality: Right;   APPENDECTOMY     COLONOSCOPY  11/17/2005   TAs - [facility]   COLONOSCOPY N/A 01/25/2019   Procedure: COLONOSCOPY;  Surgeon: Juanita Craver,  MD;  Location: [facility];  Service: Endoscopy;  Laterality: N/A;   EYE SURGERY Bilateral    blood vessels -cautery   HEMOSTASIS CLIP PLACEMENT  01/25/2019   Procedure: HEMOSTASIS CLIP PLACEMENT;  Surgeon: Juanita Craver, MD;  Location: [facility];  Service: Endoscopy;;   I & D EXTREMITY Right 03/04/2015   Procedure: IRRIGATION [name] DEBRIDEMENT RIGHT FOOT;  Surgeon: Landis Martins, DPM;  Location: [facility];  Service: Podiatry;  Laterality: Right;   WISDOM TOOTH EXTRACTION     only 2 ext     Current Outpatient Medications on File Prior to Visit  Medication Sig Dispense Refill   Continuous Blood Gluc Sensor (FREESTYLE LIBRE 14 DAY SENSOR) MISC SMARTSIG:1 Topical Every 2 Weeks     empagliflozin (JARDIANCE) 25 MG TABS tablet Take 25 mg by mouth daily.     fluticasone (FLONASE) 50 MCG/ACT nasal spray Place 2 sprays into both nostrils daily as needed for allergies or rhinitis.     HUMALOG KWIKPEN 200 UNIT/ML KwikPen SMARTSIG:15 Unit(s) SUB-Q 3 Times Daily     ibuprofen (ADVIL) 200 MG tablet as needed.     Insulin Degludec (TRESIBA FLEXTOUCH) 200 UNIT/ML SOPN Inject 100 Units into the skin daily before breakfast.      metFORMIN (GLUCOPHAGE) 1000 MG tablet Take 1,000 mg by mouth 2 (two) times daily.     Multiple Vitamin (MULTIVITAMIN) tablet Take 1 tablet by mouth daily.     Olmesartan-Amlodipine-HCTZ 40-10-25 MG TABS Take 1 tablet by mouth daily. Reported on 02/04/2015     omega-3 acid ethyl esters (LOVAZA) 1 g capsule Take 1 capsule by mouth 2 (two) times daily.     oxyCODONE-acetaminophen (PERCOCET) 5-325 MG tablet Take 1 tablet by mouth every 4 (four) hours as needed for severe pain. 30 tablet 0    rosuvastatin (CRESTOR) 10 MG tablet Take 10 mg by mouth daily.     sildenafil (VIAGRA) 100 MG tablet One by mouth daily when necessary     No current facility-administered medications on file prior to visit.    Allergies  Allergen Reactions   Robinul [Glycopyrrolate]     Caused throat muscles to relax [name] pt couldn't swallow    Physical exam:  Today's Vitals   12/14/20 0810  BP: (!) 157/71  Pulse: 74  Weight: (!) 309 lb (140.2 kg)  Height: '6\' 2"'  (1.88 m)   Body mass index is 39.67 kg/m.   Wt Readings from Last 3 Encounters:  12/14/20 (!) 309 lb (140.2 kg)  09/17/20 299 lb (135.6 kg)  09/07/20 (!) 304 lb (137.9 kg)     Ht Readings from Last 3 Encounters:  12/14/20 '6\' 2"'  (1.88 m)  09/17/20 '6\' 1"'  (1.854 m)  09/07/20 '6\' 2"'  (1.88 m)      General: The patient is awake, alert [name] appears not in acute distress. The patient is well groomed. Head: Normocephalic, atraumatic. Neck is supple- no temporal indentation is  seen.  Dental status:  Cardiovascular:  Regular rate [name] cardiac rhythm by pulse.  Respiratory: Lungs are clear to auscultation.  Skin:  Without evidence of [name] edema, or rash. Trunk: The patient's posture is erect.   Neurologic exam : The patient is awake [name] alert, oriented to place [name] time.   Memory subjective described as intact.  Attention span & concentration ability appears normal.  Speech is fluent,  without  dysarthria, dysphonia or aphasia.  Mood [name] affect are appropriate.   Cranial nerves: no loss of smell or taste reported  Pupils are  equal [name] briskly reactive to light. Funduscopic exam : The patient had bilateral evidence of retinopathy, evidence of laser surgery bilaterally..  Extraocular movements in vertical & horizontal planes were conjugate.  (Red lens test covering the right eye shows that with gaze variation in the horizontal plane brief horizontal diplopia widens as the left eye fails to fully move to left widening gap between white  [name] red image.  The images remain next to each other not on top of each other.  It is the left  eye that is affected.) Hearing was intact to soft voice [name] finger rubbing.    Facial sensation intact to fine touch.  Facial motor strength is symmetric [name] tongue [name] uvula move midline.  Neck ROM : rotation, tilt [name] flexion extension were normal for age [name] shoulder shrug was symmetrical.    Motor exam:  Symmetric bulk, tone [name] ROM.   Normal tone without cog wheeling, symmetric grip strength .   Sensory:  deferred.  Coordination: Rapid alternating movements in the fingers/hands were of normal speed.  The Finger-to-nose maneuver was intact without evidence of ataxia,.   Gait [name] station: deferred.       After spending a total time of  25  minutes face to face [name] additional time for physical [name] neurologic examination, review of laboratory studies,  personal review of imaging studies, reports [name] results of other testing [name] review of referral information / records as far as provided in visit, I have established the following assessments:  1)  his diplopia has cleared up, 3 months ago- DM related abducens paresis,binocular horizontal diplopia  with left gaze- left Eye .   2) OSA on CPAP. Compliant buy days , limited by daily hours- he reports chronic back pain to be the cause of his limited sleep hours. 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    My Plan is to proceed with:  1) keep using CPAP, started therapy 6/ 2021.  2) Dr [name], please consider PT evaluation , physical rehab .   I would like to thank [name] Solian, MD [name] [name], [address] [place],  [place] [address] for allowing me to meet with [name] to take care of this pleasant patient.   In short, [name] is presenting with OSA on CPAP.   Electronically signed by: [name] Seat, MD 12/14/2020 8:37 AM  [name] Sleep Board certified by The [employer] of  Sleep Medicine [name] Diplomate of the [employer] of Sleep Medicine. Board certified In Neurology through the [place], Fellow of the [employer] of Neurology. Medical Director of [employer].

## 2021-06-28 ENCOUNTER — Ambulatory Visit (INDEPENDENT_AMBULATORY_CARE_PROVIDER_SITE_OTHER): Payer: Managed Care, Other (non HMO) | Admitting: Family Medicine

## 2021-06-28 ENCOUNTER — Encounter: Payer: Self-pay | Admitting: Family Medicine

## 2021-06-28 VITALS — BP 116/70 | Ht 74.0 in | Wt 286.0 lb

## 2021-06-28 DIAGNOSIS — G8929 Other chronic pain: Secondary | ICD-10-CM

## 2021-06-28 DIAGNOSIS — M545 Low back pain, unspecified: Secondary | ICD-10-CM | POA: Diagnosis not present

## 2021-06-28 NOTE — Progress Notes (Signed)
PCP: Chilton Greathouse, MD  Subjective:   HPI: Patient is a 56 y.o. male here for low back pain.  Patient reports low back pain on and off for multiple years.  Most recent flare has been ongoing for the past 6 months.  He reports the Advil helps briefly.  Pain is localized to the low back and does not radiate down either leg or up his back or through to his abdomen.  He denies any recent trauma or falls, history of cancer, smoking, IVDU, fevers, chills, constipation, saddle anesthesia, or bowel/bladder incontinence.  He is interested in physical therapy if deemed appropriate and conservative management.  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  Reports walking for exercise for 15 to 20 minutes about 2-3 times per week.  He denies any pain with walking up/down stairs or up/down hills.  Walking and activity does not make his pain worse.  Pain is worse when trying to get out of bed or shortly thereafter.  He has a baseline history of diabetic neuropathy but denies any acute worsening of his baseline lower extremity tingling.  No issues with his gait, no lower extremity weakness.  Past Medical History:  Diagnosis Date   Broken ankle    Broken arm    Diabetes mellitus without complication (HCC)    type 2   Diabetic retinopathy (HCC)    PDR OU   History of Bell's palsy    Hyperlipidemia    Hypertension    Osteomyelitis (HCC)    foot   Sleep apnea    wears CPAP    Current Outpatient Medications on File Prior to Visit  Medication Sig Dispense Refill   Continuous Blood Gluc Sensor (FREESTYLE LIBRE 14 DAY SENSOR) MISC SMARTSIG:1 Topical Every 2 Weeks     empagliflozin (JARDIANCE) 25 MG TABS tablet Take 25 mg by mouth daily.     fluticasone (FLONASE) 50 MCG/ACT nasal spray Place 2 sprays into both nostrils daily as needed for allergies or rhinitis.     HUMALOG KWIKPEN 200 UNIT/ML KwikPen SMARTSIG:15 Unit(s) SUB-Q 3 Times Daily     ibuprofen (ADVIL) 200 MG tablet as  needed.     Insulin Degludec (TRESIBA FLEXTOUCH) 200 UNIT/ML SOPN Inject 100 Units into the skin daily before breakfast.      metFORMIN (GLUCOPHAGE) 1000 MG tablet Take 1,000 mg by mouth 2 (two) times daily.     Multiple Vitamin (MULTIVITAMIN) tablet Take 1 tablet by mouth daily.     Olmesartan-Amlodipine-HCTZ 40-10-25 MG TABS Take 1 tablet by mouth daily. Reported on 02/04/2015     omega-3 acid ethyl esters (LOVAZA) 1 g capsule Take 1 capsule by mouth 2 (two) times daily.     oxyCODONE-acetaminophen (PERCOCET) 5-325 MG tablet Take 1 tablet by mouth every 4 (four) hours as needed for severe pain. 30 tablet 0   rosuvastatin (CRESTOR) 10 MG tablet Take 10 mg by mouth daily.     sildenafil (VIAGRA) 100 MG tablet One by mouth daily when necessary     No current facility-administered medications on file prior to visit.    Past Surgical History:  Procedure Laterality Date   AMPUTATION Right 03/04/2015   Procedure: PARTIAL AMPUTATION RIGHT 5TH METATARSAL;  Surgeon: Asencion Islam, DPM;  Location: [facility] OR;  Service: Podiatry;  Laterality: Right;   APPENDECTOMY     COLONOSCOPY  11/17/2005   TAs - [facility]   COLONOSCOPY N/A 01/25/2019   Procedure: COLONOSCOPY;  Surgeon: Charna Elizabeth,  MD;  Location: [facility];  Service: Endoscopy;  Laterality: N/A;   EYE SURGERY Bilateral    blood vessels -cautery   HEMOSTASIS CLIP PLACEMENT  01/25/2019   Procedure: HEMOSTASIS CLIP PLACEMENT;  Surgeon: Charna Elizabeth, MD;  Location: [facility];  Service: Endoscopy;;   I & D EXTREMITY Right 03/04/2015   Procedure: IRRIGATION AND DEBRIDEMENT RIGHT FOOT;  Surgeon: Asencion Islam, DPM;  Location: [facility] OR;  Service: Podiatry;  Laterality: Right;   WISDOM TOOTH EXTRACTION     only 2 ext    Allergies  Allergen Reactions   Robinul [Glycopyrrolate]     Caused throat muscles to relax and pt couldn't swallow    BP 116/70   Ht 6\' 2"  (1.88 m)   Wt 286 lb (129.7 kg)   BMI 36.72 kg/m       No data to display               No data to display              Objective:  Physical Exam:  Gen: NAD, comfortable in exam room  Inspection: Slouched posture but shoulders and hips symmetric.  No skin changes, erythema, ecchymosis, edema, soft tissue swelling, ulcerations, or lacerations. Palpation: TTP over the left lumbar paraspinal muscle.  No vertebral TTP. ROM: Full back extension, flexion, side bends and rotation.  Normal neck ROM.  Normal internal and external hip rotation. Strength: Normal muscle tone and strength of back and hips.  Normal hip flexion and extension, normal hip adductor and abductor strength. Specific tests: Negative straight leg raise bilaterally.  Negative logroll bilaterally. Neurovascular: Neurovascularly intact distally Gait: Normal stable gait   Assessment & Plan:  1.  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  Agree with his note and findings.  Note edited above.

## 2021-07-12 ENCOUNTER — Ambulatory Visit: Payer: Managed Care, Other (non HMO) | Attending: Family Medicine

## 2021-07-12 DIAGNOSIS — M545 Low back pain, unspecified: Secondary | ICD-10-CM | POA: Diagnosis not present

## 2021-07-12 DIAGNOSIS — M5459 Other low back pain: Secondary | ICD-10-CM | POA: Diagnosis present

## 2021-07-12 DIAGNOSIS — M6281 Muscle weakness (generalized): Secondary | ICD-10-CM | POA: Diagnosis present

## 2021-07-12 DIAGNOSIS — G8929 Other chronic pain: Secondary | ICD-10-CM | POA: Insufficient documentation

## 2021-07-12 NOTE — Therapy (Addendum)
OUTPATIENT PHYSICAL THERAPY THORACOLUMBAR EVALUATION   Patient Name: [name] MRN: [id number] DOB:14-Sep-1965, 56 y.o., male Today's Date: 07/12/2021   PT End of Session - 07/12/21 1544     Visit Number 1    Number of Visits 6    Date for PT Re-Evaluation 09/06/21    Authorization Type [employer]    PT Start Time 1445    PT Stop Time 1530    PT Time Calculation (min) 45 min    Activity Tolerance Patient tolerated treatment well    Behavior During Therapy WFL for tasks assessed/performed             Past Medical History:  Diagnosis Date   Broken ankle    Broken arm    Diabetes mellitus without complication ([place])    type 2   Diabetic retinopathy ([place])    PDR OU   History of Bell's palsy    Hyperlipidemia    Hypertension    Osteomyelitis ([place])    foot   Sleep apnea    wears CPAP   Past Surgical History:  Procedure Laterality Date   AMPUTATION Right 03/04/2015   Procedure: PARTIAL AMPUTATION RIGHT 5TH METATARSAL;  Surgeon: Landis Martins, DPM;  Location: [facility];  Service: Podiatry;  Laterality: Right;   APPENDECTOMY     COLONOSCOPY  11/17/2005   TAs - [place]   COLONOSCOPY N/A 01/25/2019   Procedure: COLONOSCOPY;  Surgeon: Juanita Craver, MD;  Location: [facility] ENDOSCOPY;  Service: Endoscopy;  Laterality: N/A;   EYE SURGERY Bilateral    blood vessels -cautery   HEMOSTASIS CLIP PLACEMENT  01/25/2019   Procedure: HEMOSTASIS CLIP PLACEMENT;  Surgeon: Juanita Craver, MD;  Location: [facility];  Service: Endoscopy;;   I & D EXTREMITY Right 03/04/2015   Procedure: IRRIGATION AND DEBRIDEMENT RIGHT FOOT;  Surgeon: Landis Martins, DPM;  Location: [facility];  Service: Podiatry;  Laterality: Right;   WISDOM TOOTH EXTRACTION     only 2 ext   Patient Active Problem List   Diagnosis Date Noted   Insomnia secondary to chronic pain 12/14/2020   OSA on CPAP 12/14/2020   Binocular vision disorder with diplopia 07/27/2020   Hypersomnia with sleep apnea 04/08/2019   Irregular heart  rhythm 04/08/2019   Orthostatic dizziness 04/08/2019   Lower GI bleed 04/08/2019   Iron deficiency anemia due to chronic blood loss 04/08/2019   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[place]) 04/08/2019   Morbid obesity ([place]) 04/08/2019   Cardiac arrhythmia 03/21/2019   Somnolence 03/21/2019   Rectal bleeding 01/25/2019   GI bleed 01/25/2019   Hypertension    Diabetes mellitus without complication ([place])    Polyneuropathy 04/23/2018   Family history of colonic polyps 09/07/2015   Encounter for general adult medical examination without abnormal findings 03/26/2015   Influenza A 03/09/2015   Fever, unspecified 03/08/2015   Nausea and vomiting 03/08/2015   Hyperlipidemia 03/04/2015   Osteomyelitis ([place]) 03/04/2015   Edema leg 02/26/2015   Pain of right lower leg 02/26/2015   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[place]) 02/19/2015   Diabetes mellitus ([place]) 02/10/2015   Foot osteomyelitis, right ([place]) 02/04/2015   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   Pure hypertriglyceridemia 06/07/2009   Type II diabetes mellitus ([place]) 07/10/2007   HYPERTENSION 07/10/2007    PCP: [name], MD  REFERRING PROVIDER: Dene Gentry, MD  REFERRING DIAG: M54.50,G89.29 (ICD-10-CM) - Chronic bilateral low back pain without sciatica   Rationale for Evaluation and Treatment Rehabilitation  THERAPY DIAG:  Chronic bilateral low back pain without sciatica    ONSET DATE: 06/28/2021   SUBJECTIVE:                                                                                                                                                                                           SUBJECTIVE STATEMENT: Describes a years long history of flow back pain w/o radicular symptoms. Relief with activity and OTC anti inflammatories.  PERTINENT HISTORY:  Patient reports low back pain on and off for multiple years.  Most recent flare has been ongoing for the past 6 months.  He reports the Advil helps  briefly.  Pain is localized to the low back and does not radiate down either leg or up his back or through to his abdomen.  He denies any recent trauma or falls, history of cancer, smoking, IVDU, fevers, chills, constipation, saddle anesthesia, or bowel/bladder incontinence.  He is interested in physical therapy if deemed appropriate and conservative management.  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  Reports walking for exercise for 15 to 20 minutes about 2-3 times per week.  He denies any pain with walking up/down stairs or up/down hills.  Walking and activity does not make his pain worse.  Pain is worse when trying to get out of bed or shortly thereafter.  He has a baseline history of diabetic neuropathy but denies any acute worsening of his baseline lower extremity tingling.  No issues with his gait, no lower extremity weakness.   PAIN:  Are you having pain? Yes: NPRS scale: 7/10 Pain location: central low back Pain description: ache Aggravating factors: sleep positions Relieving factors: activity   PRECAUTIONS: None  WEIGHT BEARING RESTRICTIONS No  FALLS:  Has patient fallen in last 6 months? No  LIVING ENVIRONMENT: Lives with: lives with their family Lives in: House/apartment Stairs:  yes Has following equipment at home: None  OCCUPATION: Sales promotion account executive  PLOF: Independent  PATIENT GOALS To reduce and manage my back pain   OBJECTIVE:   DIAGNOSTIC FINDINGS:  None noted  PATIENT SURVEYS:  FOTO 54(66 predicted)  SCREENING FOR RED FLAGS: Bowel or bladder incontinence: Yes:    COGNITION:  Overall cognitive status: Within functional limits for tasks assessed     SENSATION: Not tested  MUSCLE LENGTH: Hamstrings: Right 65 deg; Left 65 deg Thomas test: tight hip flexors B   POSTURE: rounded shoulders, decreased lumbar lordosis, and increased thoracic kyphosis  PALPATION: Increased tone B paraspinals  LUMBAR ROM:   Active  A/PROM  eval   Flexion 75%  Extension 10%  Right lateral  flexion 25%  Left lateral flexion 25%  Right rotation 75%  Left rotation 75%   (Blank rows = not tested)  LOWER EXTREMITY ROM:     Active  Right eval Left eval  Hip flexion    Hip extension 0d 0d  Hip abduction    Hip adduction    Hip internal rotation    Hip external rotation    Knee flexion    Knee extension    Ankle dorsiflexion    Ankle plantarflexion    Ankle inversion    Ankle eversion     (Blank rows = not tested)  LOWER EXTREMITY MMT:    MMT Right eval Left eval  Hip flexion    Hip extension    Hip abduction    Hip adduction    Hip internal rotation    Hip external rotation    Knee flexion    Knee extension    Ankle dorsiflexion    Ankle plantarflexion    Ankle inversion    Ankle eversion     (Blank rows = not tested)  LUMBAR SPECIAL TESTS:  Straight leg raise test: Negative, Slump test: Negative, FABER test: Positive, and tight hip flexors  FUNCTIONAL TESTS:  5 times sit to stand: 16s  GAIT: Distance walked: 71f x2 Assistive device utilized: None Level of assistance: Complete Independence Comments: unremarkable    TODAY'S TREATMENT  Eval and HEP   PATIENT EDUCATION:  Education details: Discussed eval findings, rehab rationale and POC and patient is in agreement  Person educated: Patient Education method: Explanation and Handouts Education comprehension: verbalized understanding and needs further education   HOME EXERCISE PROGRAM: Access Code: [email or web address] URL: [email or web address] Date: 07/12/2021 Prepared by: JSharlynn Oliphant Exercises - Lying Prone with 1 Pillow  - 2 x daily - 7 x weekly - 1 sets - 1 reps - 2 min hold - Modified Thomas Stretch  - 2 x daily - 7 x weekly - 1 sets - 1 reps - 2 min hold - Hip Flexor Stretch at [place] [employer] [place] Bed  - 2 x daily - 7 x weekly - 1 sets - 1 reps - 2 min hold  ASSESSMENT:  CLINICAL IMPRESSION: Patient is a 56y.o. male who was seen  today for physical therapy evaluation and treatment for chronic low back pain.  No neuro signs elicited but soft tissue restrictions limit lumbar mobility into extension and reproduce symptoms.  Core and LE strength deficits also noted contributing to symptoms as well.   OBJECTIVE IMPAIRMENTS decreased knowledge of condition, decreased mobility, decreased ROM, hypomobility, impaired flexibility, improper body mechanics, postural dysfunction, and pain.   ACTIVITY LIMITATIONS standing and sleeping  PERSONAL FACTORS Age, Fitness, Past/current experiences, and Time since onset of injury/illness/exacerbation are also affecting patient's functional outcome.   REHAB POTENTIAL: Good  CLINICAL DECISION MAKING: Stable/uncomplicated  EVALUATION COMPLEXITY: Low   GOALS: Goals reviewed with patient? Yes  SHORT TERM GOALS: Target date: 07/26/2021  Patient to demonstrate independence in HEP  Baseline:[id number] Goal status: INITIAL  2.  Patient able to lie prone w/o single pillow support for 5 min Baseline: discomfort with task Goal status: INITIAL  LONG TERM GOALS: Target date: 08/23/2021  Increase AROM trunk to 50% extension and B SB Baseline: 10% and 25% respectively Goal status: INITIAL  2.  4/10 worst pain Baseline: 7/10 worst pain Goal status: INITIAL  3.  5d B hip extension Baseline: 0d hip extension Goal status: INITIAL  4.  Increase FOTO score to 66 Baseline:  54 Goal status: INITIAL   PLAN: PT FREQUENCY: 1x/week  PT DURATION: 6 weeks  PLANNED INTERVENTIONS: Therapeutic exercises, Therapeutic activity, Neuromuscular re-education, Balance training, Gait training, Patient/Family education, Joint mobilization, Stair training, Manual therapy, and Re-evaluation.  PLAN FOR NEXT SESSION: HEP review and update, aerobic work, stretching and flexibility tasks, body mechanics training   Lanice Shirts, PT 07/12/2021, 3:46 PM

## 2021-07-25 NOTE — Therapy (Unsigned)
OUTPATIENT PHYSICAL THERAPY TREATMENT NOTE   Patient Name: [name] MRN: [id number] DOB:[birth year], 56 y.o., male Today's Date: 07/26/2021  PCP: [name], MD REFERRING PROVIDER: Dene Gentry, MD  END OF SESSION:   PT End of Session - 07/26/21 1447     Visit Number 2    Number of Visits 6    Date for PT Re-Evaluation 09/06/21    Authorization Type [employer]    PT Start Time 1445    PT Stop Time 1525    PT Time Calculation (min) 40 min    Activity Tolerance Patient tolerated treatment well    Behavior During Therapy WFL for tasks assessed/performed             Past Medical History:  Diagnosis Date   Broken ankle    Broken arm    Diabetes mellitus without complication ([place])    type 2   Diabetic retinopathy ([place])    PDR OU   History of Bell's palsy    Hyperlipidemia    Hypertension    Osteomyelitis ([place])    foot   Sleep apnea    wears CPAP   Past Surgical History:  Procedure Laterality Date   AMPUTATION Right 03/04/2015   Procedure: PARTIAL AMPUTATION RIGHT 5TH METATARSAL;  Surgeon: Landis Martins, DPM;  Location: [place];  Service: Podiatry;  Laterality: Right;   APPENDECTOMY     COLONOSCOPY  11/17/2005   TAs - [facility]   COLONOSCOPY N/A 01/25/2019   Procedure: COLONOSCOPY;  Surgeon: Juanita Craver, MD;  Location: [facility] ENDOSCOPY;  Service: Endoscopy;  Laterality: N/A;   EYE SURGERY Bilateral    blood vessels -cautery   HEMOSTASIS CLIP PLACEMENT  01/25/2019   Procedure: HEMOSTASIS CLIP PLACEMENT;  Surgeon: Juanita Craver, MD;  Location: [place];  Service: Endoscopy;;   I & D EXTREMITY Right 03/04/2015   Procedure: IRRIGATION AND DEBRIDEMENT RIGHT FOOT;  Surgeon: Landis Martins, DPM;  Location: [place];  Service: Podiatry;  Laterality: Right;   WISDOM TOOTH EXTRACTION     only 2 ext   Patient Active Problem List   Diagnosis Date Noted   Insomnia secondary to chronic pain 12/14/2020   OSA on CPAP 12/14/2020   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   Hypersomnia with sleep apnea 04/08/2019   Irregular heart rhythm 04/08/2019   Orthostatic dizziness 04/08/2019   Lower GI bleed 04/08/2019   Iron deficiency anemia due to chronic blood loss 04/08/2019   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[place]) 04/08/2019   Morbid obesity ([place]) 04/08/2019   Cardiac arrhythmia 03/21/2019   Somnolence 03/21/2019   Rectal bleeding 01/25/2019   GI bleed 01/25/2019   Hypertension    Diabetes mellitus without complication ([place])    Polyneuropathy 04/23/2018   Family history of colonic polyps 09/07/2015   Encounter for general adult medical examination without abnormal findings 03/26/2015   Influenza A 03/09/2015   Fever, unspecified 03/08/2015   Nausea and vomiting 03/08/2015   Hyperlipidemia 03/04/2015   Osteomyelitis ([place]) 03/04/2015   Edema leg 02/26/2015   Pain of right lower leg 02/26/2015   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[place]) 02/19/2015   Diabetes mellitus ([place]) 02/10/2015   Foot osteomyelitis, right ([place]) 02/04/2015   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  Most recent flare has been ongoing for the past 6 months.  He reports the Advil helps briefly.  Pain is localized to the low back and does not radiate down either leg or up his back or through to his abdomen.  He denies any recent trauma or falls, history of cancer, smoking, IVDU, fevers, chills, constipation, saddle anesthesia, or bowel/bladder incontinence.  He is interested in physical therapy if deemed appropriate and conservative management.  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  Reports walking for exercise for 15 to 20 minutes about 2-3 times per week.  He denies any pain with walking up/down stairs or up/down hills.  Walking and activity does not make his pain worse.  Pain is worse when trying to get out of bed or shortly thereafter.  He has a baseline history of diabetic neuropathy but denies any acute worsening of his baseline lower extremity tingling.  No issues with his gait, no lower extremity weakness.   PRECAUTIONS: one  SUBJECTIVE: Better, symptoms in AM, 5/10 pain at worst, resolves with activity  PAIN:  Are you having pain? Yes: NPRS scale: 2/10 Pain location: low back central Pain description: ache Aggravating factors: sleep positions Relieving factors: position changes   OBJECTIVE: (objective measures completed at initial evaluation unless otherwise dated)   DIAGNOSTIC FINDINGS:  None noted   PATIENT SURVEYS:  FOTO 54(66 predicted)   SCREENING FOR RED FLAGS: Bowel or bladder incontinence: Yes:     COGNITION:           Overall cognitive status: Within functional limits for tasks assessed                          SENSATION: Not tested   MUSCLE LENGTH: Hamstrings: Right 65 deg; Left 65 deg Thomas test: tight hip flexors B    POSTURE: rounded shoulders, decreased lumbar lordosis, and increased thoracic kyphosis   PALPATION: Increased tone B paraspinals   LUMBAR ROM:    Active  A/PROM  eval  Flexion 75%  Extension 10%  Right lateral flexion 25%  Left lateral flexion 25%  Right rotation 75%  Left rotation 75%   (Blank rows = not tested)   LOWER EXTREMITY ROM:      Active  Right eval Left eval  Hip flexion      Hip extension 0d 0d  Hip abduction      Hip adduction      Hip internal rotation      Hip external rotation      Knee flexion      Knee extension      Ankle dorsiflexion      Ankle plantarflexion      Ankle inversion      Ankle eversion        (Blank rows = not tested)   LOWER EXTREMITY MMT:     MMT Right eval Left eval  Hip flexion      Hip extension      Hip abduction      Hip adduction      Hip internal rotation      Hip external rotation      Knee flexion      Knee extension      Ankle dorsiflexion  Ankle plantarflexion      Ankle inversion      Ankle eversion       (Blank rows = not tested)   LUMBAR SPECIAL TESTS:  Straight leg raise test: Negative, Slump test: Negative, FABER test: Positive, and tight hip flexors   FUNCTIONAL TESTS:  5 times sit to stand: 16s   GAIT: Distance walked: 78f x2 Assistive device utilized: None Level of assistance: Complete Independence Comments: unremarkable       TODAY'S TREATMENT  [facility] Adult PT Treatment:                                                DATE: 07/26/21 Therapeutic Exercise: Nustep L2 8 min Seated hamstring stretch 30s x2 Bridge w/ball 15x  Bridge w/RTB 15x Side lie clams RTB 15/15 Curl ups 15x SLR 15x B PPT 3s hold 10x Open book 10/10  Manual Therapy: PA mobs L1-4 3x10 at each segment Grade 3      PATIENT EDUCATION:  Education details: Discussed eval findings, rehab rationale and POC and patient is in agreement  Person educated: Patient Education method: Explanation and Handouts Education comprehension: verbalized understanding and needs further education     HOME EXERCISE PROGRAM: Access Code: [email or web address] URL: [email or web address] Date: 07/26/2021 Prepared by: JSharlynn Oliphant Exercises - [facility] St[facility]  - 2 x daily - 7 x weekly - 1 sets - 1 reps - 2 min hold - Prone Press Up On Elbows  - 2 x daily - 7 x weekly - 1 sets - 1 reps - 32 min hold - Sidelying Open Book Thoracic Lumbar Rotation and Extension  - 2 x daily - 7 x weekly - 1 sets - 10 reps - 3s hold   ASSESSMENT:   CLINICAL IMPRESSION: Reports a decrease in pain as well as improved mobility.  Able to remain asleep for longer periods as back  pain does not wake him up as frequently.  Added core strengthening and continued with stretching tasks.  Progressed from prone lie to prone prop, HEP updated to reflect progress made.        OBJECTIVE IMPAIRMENTS decreased knowledge of condition, decreased mobility, decreased ROM, hypomobility, impaired flexibility, improper body mechanics, postural dysfunction, and pain.    ACTIVITY LIMITATIONS standing and sleeping   PERSONAL FACTORS Age, Fitness, Past/current experiences, and Time since onset of injury/illness/exacerbation are also affecting patient's functional outcome.    REHAB POTENTIAL: Good   CLINICAL DECISION MAKING: Stable/uncomplicated   EVALUATION COMPLEXITY: Low     GOALS: Goals reviewed with patient? Yes   SHORT TERM GOALS: Target date: 07/26/2021   Patient to demonstrate independence in HEP  Baseline:[email or web address] Goal status: Met   2.  Patient able to lie prone w/o single pillow support for 5 min Baseline: discomfort with task; 07/26/21 No discomfort reported during HEP Goal status: Met   LONG TERM GOALS: Target date: 08/23/2021   Increase AROM trunk to 50% extension and B SB Baseline: 10% and 25% respectively Goal status: INITIAL   2.  4/10 worst pain Baseline: 7/10 worst pain Goal status: INITIAL   3.  5d B hip extension Baseline: 0d hip extension Goal status: INITIAL   4.  Increase FOTO score to 66 Baseline: 54 Goal status: INITIAL     PLAN: PT FREQUENCY: 1x/week   PT DURATION: 6 weeks  PLANNED INTERVENTIONS: Therapeutic exercises, Therapeutic activity, Neuromuscular re-education, Balance training, Gait training, Patient/Family education, Joint mobilization, Stair training, Manual therapy, and Re-evaluation.   PLAN FOR NEXT SESSION: HEP review and update, aerobic work, stretching and flexibility tasks, body mechanics training, facilitate extension ROM    Lanice Shirts, PT 07/26/2021, 3:42 PM

## 2021-07-26 ENCOUNTER — Ambulatory Visit: Payer: Managed Care, Other (non HMO)

## 2021-07-26 DIAGNOSIS — M6281 Muscle weakness (generalized): Secondary | ICD-10-CM

## 2021-07-26 DIAGNOSIS — M5459 Other low back pain: Secondary | ICD-10-CM

## 2021-08-02 ENCOUNTER — Ambulatory Visit: Payer: Managed Care, Other (non HMO) | Attending: Family Medicine

## 2021-08-02 DIAGNOSIS — M6281 Muscle weakness (generalized): Secondary | ICD-10-CM | POA: Diagnosis present

## 2021-08-02 DIAGNOSIS — M5459 Other low back pain: Secondary | ICD-10-CM | POA: Diagnosis present

## 2021-08-02 NOTE — Therapy (Signed)
OUTPATIENT PHYSICAL THERAPY TREATMENT NOTE   Patient Name: [name] MRN: [id number] DOB:[birth year], 56 y.o., male Today's Date: 08/02/2021  PCP: [name], MD REFERRING PROVIDER: Dene Gentry, MD  END OF SESSION:   PT End of Session - 08/02/21 1530     Visit Number 3    Number of Visits 6    Date for PT Re-Evaluation 09/06/21    Authorization Type [employer]    PT Start Time 1530    PT Stop Time 1611    PT Time Calculation (min) 41 min    Activity Tolerance Patient tolerated treatment well    Behavior During Therapy WFL for tasks assessed/performed              Past Medical History:  Diagnosis Date   Broken ankle    Broken arm    Diabetes mellitus without complication ([place])    type 2   Diabetic retinopathy ([place])    PDR OU   History of Bell's palsy    Hyperlipidemia    Hypertension    Osteomyelitis ([place])    foot   Sleep apnea    wears CPAP   Past Surgical History:  Procedure Laterality Date   AMPUTATION Right 03/04/2015   Procedure: PARTIAL AMPUTATION RIGHT 5TH METATARSAL;  Surgeon: Landis Martins, DPM;  Location: [place];  Service: Podiatry;  Laterality: Right;   APPENDECTOMY     COLONOSCOPY  11/17/2005   TAs - [facility]   COLONOSCOPY N/A 01/25/2019   Procedure: COLONOSCOPY;  Surgeon: Juanita Craver, MD;  Location: [facility] ENDOSCOPY;  Service: Endoscopy;  Laterality: N/A;   EYE SURGERY Bilateral    blood vessels -cautery   HEMOSTASIS CLIP PLACEMENT  01/25/2019   Procedure: HEMOSTASIS CLIP PLACEMENT;  Surgeon: Juanita Craver, MD;  Location: [place];  Service: Endoscopy;;   I & D EXTREMITY Right 03/04/2015   Procedure: IRRIGATION AND DEBRIDEMENT RIGHT FOOT;  Surgeon: Landis Martins, DPM;  Location: [place];  Service: Podiatry;  Laterality: Right;   WISDOM TOOTH EXTRACTION     only 2 ext   Patient Active Problem List   Diagnosis Date Noted   Insomnia secondary to chronic pain 12/14/2020   OSA on CPAP 12/14/2020   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   Hypersomnia with sleep apnea 04/08/2019   Irregular heart rhythm 04/08/2019   Orthostatic dizziness 04/08/2019   Lower GI bleed 04/08/2019   Iron deficiency anemia due to chronic blood loss 04/08/2019   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[place]) 04/08/2019   Morbid obesity ([place]) 04/08/2019   Cardiac arrhythmia 03/21/2019   Somnolence 03/21/2019   Rectal bleeding 01/25/2019   GI bleed 01/25/2019   Hypertension    Diabetes mellitus without complication ([place])    Polyneuropathy 04/23/2018   Family history of colonic polyps 09/07/2015   Encounter for general adult medical examination without abnormal findings 03/26/2015   Influenza A 03/09/2015   Fever, unspecified 03/08/2015   Nausea and vomiting 03/08/2015   Hyperlipidemia 03/04/2015   Osteomyelitis ([place]) 03/04/2015   Edema leg 02/26/2015   Pain of right lower leg 02/26/2015   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[place]) 02/19/2015   Diabetes mellitus ([place]) 02/10/2015   Foot osteomyelitis, right ([place]) 02/04/2015   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  Most recent flare has been ongoing for the past 6 months.  He reports the Advil helps briefly.  Pain is localized to the low back and does not radiate down either leg or up his back or through to his abdomen.  He denies any recent trauma or falls, history of cancer, smoking, IVDU, fevers, chills, constipation, saddle anesthesia, or bowel/bladder incontinence.  He is interested in physical therapy if deemed appropriate and conservative management.  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  Reports walking for exercise for 15 to 20 minutes about 2-3 times per week.  He denies any pain with walking up/down stairs or up/down hills.  Walking and activity does not make his pain worse.  Pain is worse when trying to get out of bed or shortly thereafter.  He has a baseline history of diabetic neuropathy but denies any acute worsening of his baseline lower extremity tingling.  No issues with his gait, no lower extremity weakness.   PRECAUTIONS: None  SUBJECTIVE: Patient reports that he put in an [facility] unit on Saturday and exacerbated his back pain.   PAIN:  Are you having pain? Yes: NPRS scale: 1/10 (7/10 at worst) Pain location: low back central Pain description: ache Aggravating factors: sleep positions Relieving factors: position changes   OBJECTIVE: (objective measures completed at initial evaluation unless otherwise dated)   DIAGNOSTIC FINDINGS:  None noted   PATIENT SURVEYS:  FOTO 54(66 predicted)   SCREENING FOR RED FLAGS: Bowel or bladder incontinence: Yes:     COGNITION:           Overall cognitive status: Within functional limits for tasks assessed                          SENSATION: Not tested   MUSCLE LENGTH: Hamstrings: Right 65 deg; Left 65 deg Thomas test: tight hip flexors B    POSTURE: rounded shoulders, decreased lumbar lordosis, and increased thoracic kyphosis   PALPATION: Increased tone B paraspinals   LUMBAR ROM:    Active  A/PROM  eval  Flexion 75%  Extension 10%  Right lateral flexion 25%  Left lateral flexion 25%  Right rotation 75%  Left rotation 75%   (Blank rows = not tested)   LOWER EXTREMITY ROM:      Active  Right eval Left eval  Hip flexion      Hip extension 0d 0d  Hip abduction      Hip adduction      Hip internal rotation      Hip external rotation      Knee flexion      Knee extension      Ankle dorsiflexion      Ankle plantarflexion      Ankle  inversion      Ankle eversion       (Blank rows = not tested)   LOWER EXTREMITY MMT:     MMT Right eval Left eval  Hip flexion      Hip extension      Hip abduction      Hip adduction      Hip internal rotation      Hip external rotation      Knee flexion      Knee  extension      Ankle dorsiflexion      Ankle plantarflexion      Ankle inversion      Ankle eversion       (Blank rows = not tested)   LUMBAR SPECIAL TESTS:  Straight leg raise test: Negative, Slump test: Negative, FABER test: Positive, and tight hip flexors   FUNCTIONAL TESTS:  5 times sit to stand: 16s   GAIT: Distance walked: 30f x2 Assistive device utilized: None Level of assistance: Complete Independence Comments: unremarkable       TODAY'S TREATMENT  [facility] Adult PT Treatment:                                                DATE: 08/02/2021 Therapeutic Exercise: Nustep L2 8 min Seated hamstring stretch 30s x2 Bridge w/ball 15x  Supine clamshells RTB 15x Bridge w/RTB 15x Side lie clams RTB 15/15 SLR 15x B PPT 3s hold 15x Open book 10/10 LTR x10 BIL Modified thomas stretch x1' BIL  [facility] Adult PT Treatment:                                                DATE: 07/26/21 Therapeutic Exercise: Nustep L2 8 min Seated hamstring stretch 30s x2 Bridge w/ball 15x  Bridge w/RTB 15x Side lie clams RTB 15/15 Curl ups 15x SLR 15x B PPT 3s hold 10x Open book 10/10  Manual Therapy: PA mobs L1-4 3x10 at each segment Grade 3      PATIENT EDUCATION:  Education details: Discussed eval findings, rehab rationale and POC and patient is in agreement  Person educated: Patient Education method: Explanation and Handouts Education comprehension: verbalized understanding and needs further education     HOME EXERCISE PROGRAM: Access Code: [email or web address] URL: [email or web address] Date: 07/26/2021 Prepared by: JSharlynn Oliphant Exercises - [facility]  - 2 x daily - 7 x weekly  - 1 sets - 1 reps - 2 min hold - Prone Press Up On Elbows  - 2 x daily - 7 x weekly - 1 sets - 1 reps - 32 min hold - Sidelying Open Book Thoracic Lumbar Rotation and Extension  - 2 x daily - 7 x weekly - 1 sets - 10 reps - 3s hold   ASSESSMENT:   CLINICAL IMPRESSION:  Patient reports increased pain recently due to installing a new AC unit in his house over the weekend. Session today focused on core and proximal hip strengthening as well as stretching. He requires extra cuing with PPT for form. Patient was able to tolerate all prescribed exercises with no adverse effects. Patient continues to benefit from skilled PT services and should be progressed as able to improve functional independence.      OBJECTIVE IMPAIRMENTS decreased knowledge of condition, decreased mobility, decreased ROM, hypomobility, impaired flexibility, improper body mechanics, postural dysfunction, and pain.    ACTIVITY LIMITATIONS standing and sleeping   PERSONAL FACTORS Age, Fitness, Past/current experiences, and Time since onset of injury/illness/exacerbation are also affecting patient's functional outcome.    REHAB POTENTIAL: Good   CLINICAL DECISION MAKING: Stable/uncomplicated   EVALUATION COMPLEXITY: Low     GOALS: Goals reviewed with patient? Yes   SHORT TERM GOALS:  Target date: 07/26/2021   Patient to demonstrate independence in HEP  Baseline:[id number] Goal status: Met   2.  Patient able to lie prone w/o single pillow support for 5 min Baseline: discomfort with task; 07/26/21 No discomfort reported during HEP Goal status: Met   LONG TERM GOALS: Target date: 08/23/2021   Increase AROM trunk to 50% extension and B SB Baseline: 10% and 25% respectively Goal status: INITIAL   2.  4/10 worst pain Baseline: 7/10 worst pain Goal status: INITIAL   3.  5d B hip extension Baseline: 0d hip extension Goal status: INITIAL   4.  Increase FOTO score to 66 Baseline: 54 Goal status: INITIAL     PLAN: PT  FREQUENCY: 1x/week   PT DURATION: 6 weeks   PLANNED INTERVENTIONS: Therapeutic exercises, Therapeutic activity, Neuromuscular re-education, Balance training, Gait training, Patient/Family education, Joint mobilization, Stair training, Manual therapy, and Re-evaluation.   PLAN FOR NEXT SESSION: HEP review and update, aerobic work, stretching and flexibility tasks, body mechanics training, facilitate extension ROM    Margarette Canada, PTA 08/02/21 4:11 PM

## 2021-08-08 ENCOUNTER — Ambulatory Visit: Payer: Managed Care, Other (non HMO) | Admitting: Family Medicine

## 2021-08-09 ENCOUNTER — Ambulatory Visit: Payer: Managed Care, Other (non HMO)

## 2021-08-23 ENCOUNTER — Ambulatory Visit: Payer: Managed Care, Other (non HMO)

## 2021-08-23 DIAGNOSIS — M6281 Muscle weakness (generalized): Secondary | ICD-10-CM

## 2021-08-23 DIAGNOSIS — M5459 Other low back pain: Secondary | ICD-10-CM

## 2021-08-23 NOTE — Therapy (Signed)
OUTPATIENT PHYSICAL THERAPY TREATMENT NOTE   Patient Name: [name] MRN: [id number] DOB:28-Feb-1965, 56 y.o., male Today's Date: 08/23/2021  PCP: [name], MD REFERRING PROVIDER: Dene Gentry, MD  END OF SESSION:   PT End of Session - 08/23/21 1614     Visit Number 4    Number of Visits 6    Date for PT Re-Evaluation 09/06/21    Authorization Type [employer]    PT Start Time 1615    PT Stop Time 1655    PT Time Calculation (min) 40 min    Activity Tolerance Patient tolerated treatment well    Behavior During Therapy WFL for tasks assessed/performed              Past Medical History:  Diagnosis Date   Broken ankle    Broken arm    Diabetes mellitus without complication ([place])    type 2   Diabetic retinopathy ([place])    PDR OU   History of Bell's palsy    Hyperlipidemia    Hypertension    Osteomyelitis ([place])    foot   Sleep apnea    wears CPAP   Past Surgical History:  Procedure Laterality Date   AMPUTATION Right 03/04/2015   Procedure: PARTIAL AMPUTATION RIGHT 5TH METATARSAL;  Surgeon: Landis Martins, DPM;  Location: [place];  Service: Podiatry;  Laterality: Right;   APPENDECTOMY     COLONOSCOPY  11/17/2005   TAs - [facility]   COLONOSCOPY N/A 01/25/2019   Procedure: COLONOSCOPY;  Surgeon: Juanita Craver, MD;  Location: [facility] ENDOSCOPY;  Service: Endoscopy;  Laterality: N/A;   EYE SURGERY Bilateral    blood vessels -cautery   HEMOSTASIS CLIP PLACEMENT  01/25/2019   Procedure: HEMOSTASIS CLIP PLACEMENT;  Surgeon: Juanita Craver, MD;  Location: [place];  Service: Endoscopy;;   I & D EXTREMITY Right 03/04/2015   Procedure: IRRIGATION AND DEBRIDEMENT RIGHT FOOT;  Surgeon: Landis Martins, DPM;  Location: [place];  Service: Podiatry;  Laterality: Right;   WISDOM TOOTH EXTRACTION     only 2 ext   Patient Active Problem List   Diagnosis Date Noted   Insomnia secondary to chronic pain 12/14/2020   OSA on CPAP 12/14/2020   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   Hypersomnia with sleep apnea 04/08/2019   Irregular heart rhythm 04/08/2019   Orthostatic dizziness 04/08/2019   Lower GI bleed 04/08/2019   Iron deficiency anemia due to chronic blood loss 04/08/2019   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[place]) 04/08/2019   Morbid obesity ([place]) 04/08/2019   Cardiac arrhythmia 03/21/2019   Somnolence 03/21/2019   Rectal bleeding 01/25/2019   GI bleed 01/25/2019   Hypertension    Diabetes mellitus without complication ([place])    Polyneuropathy 04/23/2018   Family history of colonic polyps 09/07/2015   Encounter for general adult medical examination without abnormal findings 03/26/2015   Influenza A 03/09/2015   Fever, unspecified 03/08/2015   Nausea and vomiting 03/08/2015   Hyperlipidemia 03/04/2015   Osteomyelitis ([place]) 03/04/2015   Edema leg 02/26/2015   Pain of right lower leg 02/26/2015   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[place]) 02/19/2015   Diabetes mellitus ([place]) 02/10/2015   Foot osteomyelitis, right ([place]) 02/04/2015   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  Most recent flare has been ongoing for the past 6 months.  He reports the Advil helps briefly.  Pain is localized to the low back and does not radiate down either leg or up his back or through to his abdomen.  He denies any recent trauma or falls, history of cancer, smoking, IVDU, fevers, chills, constipation, saddle anesthesia, or bowel/bladder incontinence.  He is interested in physical therapy if deemed appropriate and conservative management.  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  Reports walking for exercise for 15 to 20 minutes about 2-3 times per week.  He denies any pain with walking up/down stairs or up/down hills.  Walking and activity does not make his pain worse.  Pain is worse when trying to get out of bed or shortly thereafter.  He has a baseline history of diabetic neuropathy but denies any acute worsening of his baseline lower extremity tingling.  No issues with his gait, no lower extremity weakness.   PRECAUTIONS: None  SUBJECTIVE: Mild setback noted after sleeping on a different mattress on vacation, but has returned to baseline and above.  PAIN:  Are you having pain? Yes: NPRS scale: 1/10 (7/10 at worst) Pain location: low back central Pain description: ache Aggravating factors: sleep positions Relieving factors: position changes   OBJECTIVE: (objective measures completed at initial evaluation unless otherwise dated)   DIAGNOSTIC FINDINGS:  None noted   PATIENT SURVEYS:  FOTO 54(66 predicted)   SCREENING FOR RED FLAGS: Bowel or bladder incontinence: Yes:     COGNITION:           Overall cognitive status: Within functional limits for tasks assessed                          SENSATION: Not tested   MUSCLE LENGTH: Hamstrings: Right 65 deg; Left 65 deg Thomas test: tight hip flexors B    POSTURE: rounded shoulders, decreased lumbar lordosis, and increased thoracic kyphosis   PALPATION: Increased tone B paraspinals   LUMBAR ROM:    Active  A/PROM  eval  Flexion 75%  Extension 10%  Right lateral flexion 25%  Left lateral flexion 25%  Right rotation 75%  Left rotation 75%   (Blank rows = not tested)   LOWER EXTREMITY ROM:      Active  Right eval Left eval  Hip flexion      Hip extension 0d 0d  Hip abduction      Hip adduction      Hip internal rotation      Hip external rotation      Knee flexion      Knee extension      Ankle dorsiflexion      Ankle  plantarflexion      Ankle inversion      Ankle eversion       (Blank rows = not tested)   LOWER EXTREMITY MMT:     MMT Right eval Left eval  Hip flexion      Hip extension      Hip abduction      Hip adduction      Hip internal rotation      Hip external rotation      Knee flexion  Knee extension      Ankle dorsiflexion      Ankle plantarflexion      Ankle inversion      Ankle eversion       (Blank rows = not tested)   LUMBAR SPECIAL TESTS:  Straight leg raise test: Negative, Slump test: Negative, FABER test: Positive, and tight hip flexors   FUNCTIONAL TESTS:  5 times sit to stand: 16s   GAIT: Distance walked: 29f x2 Assistive device utilized: None Level of assistance: Complete Independence Comments: unremarkable       TODAY'S TREATMENT  [facility] Adult PT Treatment:                                                DATE: 08/23/21 Therapeutic Exercise: Nustep L4 8 min Seated hamstring stretch 30s x2 Bridge w/ball 15x  Supine hip fallouts GTB 15x Bridge w/GTB 15x Side lie clams GTB 15/15 SLR 15x B PPT 3s hold 15x Supine march w/PPT 15/15 Open book 10/10 Prone press Modified thomas stretch x1' BIL  [facility] Adult PT Treatment:                                                DATE: 08/02/2021 Therapeutic Exercise: Nustep L2 8 min Seated hamstring stretch 30s x2 Bridge w/ball 15x  Supine clamshells RTB 15x Bridge w/RTB 15x Side lie clams RTB 15/15 SLR 15x B PPT 3s hold 15x Open book 10/10 LTR x10 BIL Modified thomas stretch x1' BIL  [facility] Adult PT Treatment:                                                DATE: 07/26/21 Therapeutic Exercise: Nustep L2 8 min Seated hamstring stretch 30s x2 Bridge w/ball 15x  Bridge w/RTB 15x Side lie clams RTB 15/15 Curl ups 15x SLR 15x B PPT 3s hold 10x Open book 10/10  Manual Therapy: PA mobs L1-4 3x10 at each segment Grade 3      PATIENT EDUCATION:  Education details: Discussed eval findings, rehab rationale and  POC and patient is in agreement  Person educated: Patient Education method: Explanation and Handouts Education comprehension: verbalized understanding and needs further education     HOME EXERCISE PROGRAM: Access Code: [email or web address] URL: [email or web address] Date: 07/26/2021 Prepared by: JSharlynn Oliphant [facility]erc[facility]  - 2 x daily - 7 x weekly - 1 sets - 1 reps - 2 min hold - Prone Press Up On Elbows  - 2 x daily - 7 x weekly - 1 sets - 1 reps - 32 min hold - Sidelying Open Book Thoracic Lumbar Rotation and Extension  - 2 x daily - 7 x weekly - 1 sets - 10 reps - 3s hold   ASSESSMENT:   CLINICAL IMPRESSION:  Added additional core tasks and functional strengthening with core  facilitated, increased time on hip flexor stretch to emphasize soft tissue creep.  Added OP to prone press tasks.  Overall lumbar extension ROM increasing.    OBJECTIVE IMPAIRMENTS decreased knowledge of condition, decreased mobility, decreased ROM, hypomobility, impaired  flexibility, improper body mechanics, postural dysfunction, and pain.    ACTIVITY LIMITATIONS standing and sleeping   PERSONAL FACTORS Age, Fitness, Past/current experiences, and Time since onset of injury/illness/exacerbation are also affecting patient's functional outcome.    REHAB POTENTIAL: Good   CLINICAL DECISION MAKING: Stable/uncomplicated   EVALUATION COMPLEXITY: Low     GOALS: Goals reviewed with patient? Yes   SHORT TERM GOALS: Target date: 07/26/2021   Patient to demonstrate independence in HEP  Baseline:[email or web address] Goal status: Met   2.  Patient able to lie prone w/o single pillow support for 5 min Baseline: discomfort with task; 07/26/21 No discomfort reported during HEP Goal status: Met   LONG TERM GOALS: Target date: 08/23/2021   Increase AROM trunk to 50% extension and B SB Baseline: 10% and 25% respectively Goal status: INITIAL   2.  4/10 worst pain Baseline: 7/10 worst  pain Goal status: INITIAL   3.  5d B hip extension Baseline: 0d hip extension Goal status: INITIAL   4.  Increase FOTO score to 66 Baseline: 54 Goal status: INITIAL     PLAN: PT FREQUENCY: 1x/week   PT DURATION: 6 weeks   PLANNED INTERVENTIONS: Therapeutic exercises, Therapeutic activity, Neuromuscular re-education, Balance training, Gait training, Patient/Family education, Joint mobilization, Stair training, Manual therapy, and Re-evaluation.   PLAN FOR NEXT SESSION: HEP review and update, aerobic work, stretching and flexibility tasks, body mechanics training, facilitate extension ROM    Leroy Sea PT  08/23/21 4:58 PM

## 2021-09-06 ENCOUNTER — Ambulatory Visit: Payer: Managed Care, Other (non HMO)

## 2021-09-13 ENCOUNTER — Ambulatory Visit: Payer: Managed Care, Other (non HMO) | Attending: Family Medicine

## 2021-09-13 DIAGNOSIS — M6281 Muscle weakness (generalized): Secondary | ICD-10-CM | POA: Diagnosis present

## 2021-09-13 DIAGNOSIS — M5459 Other low back pain: Secondary | ICD-10-CM | POA: Insufficient documentation

## 2021-09-13 NOTE — Therapy (Signed)
OUTPATIENT PHYSICAL THERAPY TREATMENT NOTE/DC SUMMARY   Patient Name: [name] MRN: [id number] DOB:November 25, 1965, 56 y.o., male Today's Date: 09/13/2021  PCP: [name], MD REFERRING PROVIDER: Dene Gentry, MD PHYSICAL THERAPY DISCHARGE SUMMARY  Visits from Start of Care: 5  Current functional level related to goals / functional outcomes: Goals met   Remaining deficits: Lumbar extension ROM   Education / Equipment: HEP   Patient agrees to discharge. Patient goals were met. Patient is being discharged due to being pleased with the current functional level.  END OF SESSION:   PT End of Session - 09/13/21 1450     Visit Number 5    Number of Visits 6    Date for PT Re-Evaluation 09/06/21    Authorization Type [employer]    PT Start Time 1445    PT Stop Time 1525    PT Time Calculation (min) 40 min    Activity Tolerance Patient tolerated treatment well    Behavior During Therapy WFL for tasks assessed/performed              Past Medical History:  Diagnosis Date   Broken ankle    Broken arm    Diabetes mellitus without complication ([place])    type 2   Diabetic retinopathy ([place])    PDR OU   History of Bell's palsy    Hyperlipidemia    Hypertension    Osteomyelitis ([place])    foot   Sleep apnea    wears CPAP   Past Surgical History:  Procedure Laterality Date   AMPUTATION Right 03/04/2015   Procedure: PARTIAL AMPUTATION RIGHT 5TH METATARSAL;  Surgeon: Landis Martins, DPM;  Location: [place];  Service: Podiatry;  Laterality: Right;   APPENDECTOMY     COLONOSCOPY  11/17/2005   TAs - [facility]   COLONOSCOPY N/A 01/25/2019   Procedure: COLONOSCOPY;  Surgeon: Juanita Craver, MD;  Location: [facility] ENDOSCOPY;  Service: Endoscopy;  Laterality: N/A;   EYE SURGERY Bilateral    blood vessels -cautery   HEMOSTASIS CLIP PLACEMENT  01/25/2019   Procedure: HEMOSTASIS CLIP PLACEMENT;  Surgeon: Juanita Craver, MD;  Location: [place];  Service: Endoscopy;;   I & D  EXTREMITY Right 03/04/2015   Procedure: IRRIGATION AND DEBRIDEMENT RIGHT FOOT;  Surgeon: Landis Martins, DPM;  Location: [place];  Service: Podiatry;  Laterality: Right;   WISDOM TOOTH EXTRACTION     only 2 ext   Patient Active Problem List   Diagnosis Date Noted   Insomnia secondary to chronic pain 12/14/2020   OSA on CPAP 12/14/2020   Binocular vision disorder with diplopia 07/27/2020   Hypersomnia with sleep apnea 04/08/2019   Irregular heart rhythm 04/08/2019   Orthostatic dizziness 04/08/2019   Lower GI bleed 04/08/2019   Iron deficiency anemia due to chronic blood loss 04/08/2019   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[place]) 04/08/2019   Morbid obesity ([place]) 04/08/2019   Cardiac arrhythmia 03/21/2019   Somnolence 03/21/2019   Rectal bleeding 01/25/2019   GI bleed 01/25/2019   Hypertension    Diabetes mellitus without complication ([place])    Polyneuropathy 04/23/2018   Family history of colonic polyps 09/07/2015   Encounter for general adult medical examination without abnormal findings 03/26/2015   Influenza A 03/09/2015   Fever, unspecified 03/08/2015   Nausea and vomiting 03/08/2015   Hyperlipidemia 03/04/2015   Osteomyelitis ([place]) 03/04/2015   Edema leg 02/26/2015   Pain of right lower leg 02/26/2015   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  [place]) 02/19/2015   Diabetes mellitus ([place]) 02/10/2015   Foot osteomyelitis, right ([place]) 02/04/2015   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  Most recent flare has been ongoing for the past 6 months.  He reports the Advil  helps briefly.  Pain is localized to the low back and does not radiate down either leg or up his back or through to his abdomen.  He denies any recent trauma or falls, history of cancer, smoking, IVDU, fevers, chills, constipation, saddle anesthesia, or bowel/bladder incontinence.  He is interested in physical therapy if deemed appropriate and conservative management.  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  Reports walking for exercise for 15 to 20 minutes about 2-3 times per week.  He denies any pain with walking up/down stairs or up/down hills.  Walking and activity does not make his pain worse.  Pain is worse when trying to get out of bed or shortly thereafter.  He has a baseline history of diabetic neuropathy but denies any acute worsening of his baseline lower extremity tingling.  No issues with his gait, no lower extremity weakness.   PRECAUTIONS: None  SUBJECTIVE: No pain over the past 3 weeks, rates himself at 80-90%, built an addon to his deck w/o setback, feels ready for independent management  PAIN:  Are you having pain? Yes: NPRS scale: 1/10 (7/10 at worst) Pain location: low back central Pain description: ache Aggravating factors: sleep positions Relieving factors: position changes   OBJECTIVE: (objective measures completed at initial evaluation unless otherwise dated)   DIAGNOSTIC FINDINGS:  None noted   PATIENT SURVEYS:  FOTO 54(66 predicted); 09/13/21 70   SCREENING FOR RED FLAGS: Bowel or bladder incontinence: Yes:     COGNITION:           Overall cognitive status: Within functional limits for tasks assessed                          SENSATION: Not tested   MUSCLE LENGTH: Hamstrings: Right 65 deg; Left 65 deg Thomas test: tight hip flexors B    POSTURE: rounded shoulders, decreased lumbar lordosis, and increased thoracic kyphosis   PALPATION: Increased tone B paraspinals   LUMBAR ROM:    Active  A/PROM  eval A/PROM 09/13/21   Flexion 75% 75%  Extension 10% 33%  Right lateral flexion 25% 50%  Left lateral flexion 25% 50%  Right rotation 75%   Left rotation 75%    (Blank rows = not tested)   LOWER EXTREMITY ROM:      Active  Right eval Left eval  Hip flexion      Hip extension 0d 0d  Hip abduction      Hip adduction      Hip internal rotation      Hip external rotation      Knee flexion      Knee extension      Ankle dorsiflexion      Ankle plantarflexion      Ankle inversion      Ankle eversion       (  Blank rows = not tested)   LOWER EXTREMITY MMT:     MMT Right eval Left eval  Hip flexion      Hip extension      Hip abduction      Hip adduction      Hip internal rotation      Hip external rotation      Knee flexion      Knee extension      Ankle dorsiflexion      Ankle plantarflexion      Ankle inversion      Ankle eversion       (Blank rows = not tested)   LUMBAR SPECIAL TESTS:  Straight leg raise test: Negative, Slump test: Negative, FABER test: Positive, and tight hip flexors   FUNCTIONAL TESTS:  5 times sit to stand: 16s; 09/13/21 14s   GAIT: Distance walked: 97f x2 Assistive device utilized: None Level of assistance: Complete Independence Comments: unremarkable       TODAY'S TREATMENT  [facility] Adult PT Treatment:                                                DATE: 09/13/21 Therapeutic Exercise: Prone press 10x Lumbar extension against counter PKB 30s x1 B 5xSTS 14s  [facility] Adult PT Treatment:                                                DATE: 08/23/21 Therapeutic Exercise: Nustep L4 8 min Seated hamstring stretch 30s x2 Bridge w/ball 15x  Supine hip fallouts GTB 15x Bridge w/GTB 15x Side lie clams GTB 15/15 SLR 15x B PPT 3s hold 15x Supine march w/PPT 15/15 Open book 10/10 Prone press Modified thomas stretch x1' BIL  [facility]                                                DATE: 08/02/2021 Therapeutic Exercise: Nustep L2 8 min Seated  hamstring stretch 30s x2 Bridge w/ball 15x  Supine clamshells RTB 15x Bridge w/RTB 15x Side lie clams RTB 15/15 SLR 15x B PPT 3s hold 15x Open book 10/10 LTR x10 BIL Modified thomas stretch x1' BIL  [facility] Adult PT Treatment:                                                DATE: 07/26/21 Therapeutic Exercise: Nustep L2 8 min Seated hamstring stretch 30s x2 Bridge w/ball 15x  Bridge w/RTB 15x Side lie clams RTB 15/15 Curl ups 15x SLR 15x B PPT 3s hold 10x Open book 10/10  Manual Therapy: PA mobs L1-4 3x10 at each segment Grade 3      PATIENT EDUCATION:  Education details: Discussed eval findings, rehab rationale and POC and patient is in agreement  Person educated: Patient Education method: Explanation and Handouts Education comprehension: verbalized understanding and needs further education     HOME EXERCISE PROGRAM: Access Code: [email or web address] URL: [email or web address] Date: 07/26/2021 Prepared  by: Sharlynn Oliphant  Exercises - Hip Flexor Stretch at [facility] Bed  - 2 x daily - 7 x weekly - 1 sets - 1 reps - 2 min hold - Prone Press Up On Elbows  - 2 x daily - 7 x weekly - 1 sets - 1 reps - 32 min hold - Sidelying Open Book Thoracic Lumbar Rotation and Extension  - 2 x daily - 7 x weekly - 1 sets - 10 reps - 3s hold   ASSESSMENT:   CLINICAL IMPRESSION:  Rehab goals met, patient has returned to ADLs and functional activities w/o setback and is confident about transitioning to [place] decreased knowledge of condition, decreased mobility, decreased ROM, hypomobility, impaired flexibility, improper body mechanics, postural dysfunction, and pain.    ACTIVITY LIMITATIONS standing and sleeping   PERSONAL FACTORS Age, Fitness, Past/current experiences, and Time since onset of injury/illness/exacerbation are also affecting patient's functional outcome.    REHAB POTENTIAL: Good   CLINICAL DECISION MAKING: Stable/uncomplicated   EVALUATION  COMPLEXITY: Low     GOALS: Goals reviewed with patient? Yes   SHORT TERM GOALS: Target date: 07/26/2021   Patient to demonstrate independence in HEP  Baseline:[id number] Goal status: Met   2.  Patient able to lie prone w/o single pillow support for 5 min Baseline: discomfort with task; 07/26/21 No discomfort reported during HEP Goal status: Met   LONG TERM GOALS: Target date: 08/23/2021   Increase AROM trunk to 50% extension and B SB Baseline:  Active  A/PROM  eval A/PROM 09/13/21  Flexion 75% 75%  Extension 10% 33%  Right lateral flexion 25% 50%  Left lateral flexion 25% 50%   Goal status: Partially Met   2.  4/10 worst pain Baseline: 7/10 worst pain; 09/13/21 0/10 over 3 weeks Goal status: MET   3.  5d B hip extension Baseline: 0d hip extension; 09/13/21 5d B Goal status: Met   4.  Increase FOTO score to 66 Baseline: 54; 09/13/21 70 Goal status: MET     PLAN: PT FREQUENCY: 1x/week   PT DURATION: 6 weeks   PLANNED INTERVENTIONS: Therapeutic exercises, Therapeutic activity, Neuromuscular re-education, Balance training, Gait training, Patient/Family education, Joint mobilization, Stair training, Manual therapy, and Re-evaluation.   PLAN FOR NEXT SESSION: DC to HEP    Leroy Sea PT  09/13/21 3:48 PM

## 2021-09-14 ENCOUNTER — Ambulatory Visit: Payer: Managed Care, Other (non HMO) | Admitting: Family Medicine

## 2021-10-31 ENCOUNTER — Encounter (INDEPENDENT_AMBULATORY_CARE_PROVIDER_SITE_OTHER): Payer: Self-pay

## 2021-12-20 ENCOUNTER — Ambulatory Visit: Payer: Managed Care, Other (non HMO) | Admitting: Adult Health

## 2022-07-13 ENCOUNTER — Ambulatory Visit: Payer: Managed Care, Other (non HMO) | Admitting: Adult Health

## 2022-10-02 NOTE — Progress Notes (Signed)
No charge. 

## 2023-01-04 ENCOUNTER — Telehealth: Payer: Self-pay | Admitting: Neurology

## 2023-01-04 NOTE — Telephone Encounter (Signed)
 Pt called to r/s canceled appt

## 2023-01-08 ENCOUNTER — Ambulatory Visit: Payer: Managed Care, Other (non HMO) | Admitting: Adult Health

## 2023-07-30 ENCOUNTER — Encounter: Payer: Self-pay | Admitting: Adult Health

## 2023-07-30 ENCOUNTER — Ambulatory Visit: Payer: Managed Care, Other (non HMO) | Admitting: Adult Health

## 2023-07-30 VITALS — BP 117/62 | HR 73 | Ht 74.0 in | Wt 258.4 lb

## 2023-07-30 DIAGNOSIS — G4733 Obstructive sleep apnea (adult) (pediatric): Secondary | ICD-10-CM

## 2023-07-30 NOTE — Patient Instructions (Signed)
 Continue using CPAP nightly and greater than 4 hours each night If your symptoms worsen or you develop new symptoms please let us know.

## 2023-07-30 NOTE — Progress Notes (Signed)
 PATIENT: [name] DOB: 17-Mar-1965  REASON FOR VISIT: follow up HISTORY FROM: patient PRIMARY NEUROLOGIST: Dr. Chalice  Chief Complaint  Patient presents with   Follow-up    Rm 19, alone.  No changes or concerns.      HISTORY OF PRESENT ILLNESS: Today 07/30/23  [name] is a 58 y.o. male with a history of obstructive sleep apnea on CPAP. Returns today for follow-up.  He reports that everything is working well for him.  He still finds the CPAP beneficial.  Denies any new symptoms.  Returns today for an evaluation.     REVIEW OF SYSTEMS: Out of a complete 14 system review of symptoms, the patient complains only of the following symptoms, and all other reviewed systems are negative.   ESS 4  ALLERGIES: Allergies  Allergen Reactions   Robinul [Glycopyrrolate]     Caused throat muscles to relax and pt couldn't swallow    HOME MEDICATIONS: Outpatient Medications Prior to Visit  Medication Sig Dispense Refill   Continuous Blood Gluc Sensor (FREESTYLE LIBRE 14 DAY SENSOR) MISC SMARTSIG:1 Topical Every 2 Weeks     empagliflozin (JARDIANCE) 25 MG TABS tablet Take 25 mg by mouth daily.     fluticasone (FLONASE) 50 MCG/ACT nasal spray Place 2 sprays into both nostrils daily as needed for allergies or rhinitis.     HUMALOG KWIKPEN 200 UNIT/ML KwikPen SMARTSIG:15 Unit(s) SUB-Q 3 Times Daily     ibuprofen (ADVIL) 200 MG tablet as needed.     Insulin  Degludec (TRESIBA FLEXTOUCH) 200 UNIT/ML SOPN Inject 100 Units into the skin daily before breakfast.      LYUMJEV KWIKPEN 100 UNIT/ML KwikPen Lyumjev Rx for Novolog  is 22 units prior to breakfast, 18 units with lunch, 26 units with dinner Subcutaneous as directed; Duration: 30 days     metFORMIN  (GLUCOPHAGE ) 1000 MG tablet Take 1,000 mg by mouth 2 (two) times daily.     MOUNJARO 12.5 MG/0.5ML Pen Inject 12.5 mg into the skin once a week.     Multiple Vitamin (MULTIVITAMIN) tablet Take 1 tablet by mouth daily.      Olmesartan -Amlodipine -HCTZ 40-10-25 MG TABS Take 1 tablet by mouth daily. Reported on 02/04/2015     omega-3 acid ethyl esters (LOVAZA ) 1 g capsule Take 1 capsule by mouth 2 (two) times daily.     oxyCODONE -acetaminophen  (PERCOCET) 5-325 MG tablet Take 1 tablet by mouth every 4 (four) hours as needed for severe pain. 30 tablet 0   rosuvastatin  (CRESTOR ) 10 MG tablet Take 10 mg by mouth daily.     sildenafil (VIAGRA) 100 MG tablet One by mouth daily when necessary     No facility-administered medications prior to visit.    PAST MEDICAL HISTORY: Past Medical History:  Diagnosis Date   Broken ankle    Broken arm    Cataracts    Diabetes mellitus without complication (HCC)    type 2   Diabetic retinopathy (HCC)    PDR OU   History of Bell's palsy    Hyperlipidemia    Hypertension    Osteomyelitis (HCC)    foot   Sleep apnea    wears CPAP    PAST SURGICAL HISTORY: Past Surgical History:  Procedure Laterality Date   AMPUTATION Right 03/04/2015   Procedure: PARTIAL AMPUTATION RIGHT 5TH METATARSAL;  Surgeon: Jeb Broach, DPM;  [facility]: [facility];  Service: Podiatry;  Laterality: Right;   APPENDECTOMY     COLONOSCOPY  11/17/2005   TAs - Armbruster  COLONOSCOPY N/A 01/25/2019   Procedure: COLONOSCOPY;  Surgeon: Kristie Lamprey, MD;  Location: [facility] ENDOSCOPY;  Service: Endoscopy;  Laterality: N/A;   EYE SURGERY Bilateral    blood vessels -cautery   HEMOSTASIS CLIP PLACEMENT  01/25/2019   Procedure: HEMOSTASIS CLIP PLACEMENT;  Surgeon: Kristie Lamprey, MD;  Location: [facility] ENDOSCOPY;  Service: Endoscopy;;   I & D EXTREMITY Right 03/04/2015   Procedure: IRRIGATION AND DEBRIDEMENT RIGHT FOOT;  Surgeon: Jeb Broach, DPM;  Location: [facility];  Service: Podiatry;  Laterality: Right;   WISDOM TOOTH EXTRACTION     only 2 ext    FAMILY HISTORY: Family History  Problem Relation Age of Onset   Diabetes Father    Colon cancer Father    Colon polyps Father    Esophageal cancer Neg Hx    Rectal cancer  Neg Hx    Stomach cancer Neg Hx    Sleep apnea Neg Hx     SOCIAL HISTORY: Social History   Socioeconomic History   Marital status: Married    Spouse name: tonia   Number of children: 3   Years of education: Not on file   Highest education level: 12th grade  Occupational History   Not on file  Tobacco Use   Smoking status: Never   Smokeless tobacco: Never  Vaping Use   Vaping status: Never Used  Substance and Sexual Activity   Alcohol use: No   Drug use: No   Sexual activity: Not on file  Other Topics Concern   Not on file  Social History Narrative   Lives wih wife and 1 adopted daughter   Right handed    Caffeine: 1 cup of coffee a day   Social Drivers of Corporate investment banker Strain: Not on file  Food Insecurity: Not on file  Transportation Needs: Not on file  Physical Activity: Not on file  Stress: Not on file  Social Connections: Not on file  Intimate Partner Violence: Not on file      PHYSICAL EXAM  Vitals:   07/30/23 1246  BP: 117/62  Pulse: 73  Weight: 258 lb 6.4 oz (117.2 kg)  Height: 6' 2 (1.88 m)   Body mass index is 33.18 kg/m.  Generalized: Well developed, in no acute distress  Chest: Lungs clear to auscultation bilaterally  Neurological examination  Mentation: Alert oriented to time, place, history taking. Follows all commands speech and language fluent Cranial nerve II-XII: Facial symmetry noted  DIAGNOSTIC DATA (LABS, IMAGING, TESTING) - I reviewed patient records, labs, notes, testing and imaging myself where available.  Lab Results  Component Value Date   WBC 9.4 12/04/2019   HGB 14.4 12/04/2019   HCT 45.3 12/04/2019   MCV 84.8 12/04/2019   PLT 282 12/04/2019      Component Value Date/Time   NA 136 12/04/2019 0917   K 4.0 12/04/2019 0917   CL 101 12/04/2019 0917   CO2 23 12/04/2019 0917   GLUCOSE 165 (H) 12/04/2019 0917   BUN 23 (H) 12/04/2019 0917   CREATININE 1.08 12/04/2019 0917   CALCIUM  8.9 12/04/2019 0917    PROT 7.0 12/04/2019 0917   ALBUMIN 3.6 12/04/2019 0917   AST 18 12/04/2019 0917   ALT 19 12/04/2019 0917   ALKPHOS 43 12/04/2019 0917   BILITOT 0.5 12/04/2019 0917   GFRNONAA >60 12/04/2019 0917   GFRAA >60 01/26/2019 0536   No results found for: CHOL, HDL, LDLCALC, LDLDIRECT, TRIG, CHOLHDL Lab Results  Component Value Date  HGBA1C 8.0 (H) 01/25/2019      ASSESSMENT AND PLAN 58 y.o. year old male  has a past medical history of Broken ankle, Broken arm, Cataracts, Diabetes mellitus without complication (HCC), Diabetic retinopathy (HCC), History of Bell's palsy, Hyperlipidemia, Hypertension, Osteomyelitis (HCC), and Sleep apnea. here with:  OSA on CPAP  - CPAP compliance excellent - Good treatment of AHI  - Encourage patient to use CPAP nightly and > 4 hours each night - F/U in 1 year or sooner if needed     Duwaine Russell, MSN, NP-C 07/30/2023, 1:02 PM [facility] [address], [address] [place], [place] [address] [phone]  The patient's condition requires frequent monitoring and adjustments in the treatment plan, reflecting the ongoing complexity of care.  This provider is the continuing focal point for all needed services for this condition.

## 2023-07-31 ENCOUNTER — Ambulatory Visit: Admitting: Podiatry

## 2023-07-31 ENCOUNTER — Encounter: Payer: Self-pay | Admitting: Podiatry

## 2023-07-31 VITALS — Ht 74.0 in | Wt 258.4 lb

## 2023-07-31 DIAGNOSIS — L97522 Non-pressure chronic ulcer of other part of left foot with fat layer exposed: Secondary | ICD-10-CM

## 2023-07-31 DIAGNOSIS — E08621 Diabetes mellitus due to underlying condition with foot ulcer: Secondary | ICD-10-CM | POA: Diagnosis not present

## 2023-07-31 DIAGNOSIS — E119 Type 2 diabetes mellitus without complications: Secondary | ICD-10-CM

## 2023-07-31 MED ORDER — SILVER SULFADIAZINE 1 % EX CREA: 1.0000 | TOPICAL_CREAM | Freq: Every day | CUTANEOUS | 1 refills | Status: AC

## 2023-07-31 NOTE — Addendum Note (Signed)
 Addended by: JANIT THRESA HERO on: 07/31/2023 09:00 AM   Modules accepted: Orders

## 2023-07-31 NOTE — Progress Notes (Addendum)
 Chief Complaint  Patient presents with   Callouses    Pt is here due to a callous on the bottom of his left foot it began about 2 weeks ago, painful to walk on, states he has been wearing a band aid over it.    Subjective:  58 y.o. male with PMHx of diabetes mellitus; controlled presenting for evaluation and new onset of ulcer to the plantar aspect of the fifth MTP of the left foot.  Onset about 1 week ago when he mowed his yard.  He normally does not mow his yard.  He noticed a blister development to the plantar aspect of the left forefoot   Past Medical History:  Diagnosis Date   Broken ankle    Broken arm    Cataracts    Diabetes mellitus without complication (HCC)    type 2   Diabetic retinopathy (HCC)    PDR OU   History of Bell's palsy    Hyperlipidemia    Hypertension    Osteomyelitis (HCC)    foot   Sleep apnea    wears CPAP    Past Surgical History:  Procedure Laterality Date   AMPUTATION Right 03/04/2015   Procedure: PARTIAL AMPUTATION RIGHT 5TH METATARSAL;  Surgeon: Jeb Broach, DPM;  [facility];  Service: Podiatry;  Laterality: Right;   APPENDECTOMY     COLONOSCOPY  11/17/2005   TAs - Armbruster   COLONOSCOPY N/A 01/25/2019   Procedure: COLONOSCOPY;  Surgeon: Kristie Lamprey, MD;  Location: [facility] ENDOSCOPY;  Service: Endoscopy;  Laterality: N/A;   EYE SURGERY Bilateral    blood vessels -cautery   HEMOSTASIS CLIP PLACEMENT  01/25/2019   Procedure: HEMOSTASIS CLIP PLACEMENT;  Surgeon: Kristie Lamprey, MD;  Location: [facility] ENDOSCOPY;  Service: Endoscopy;;   I & D EXTREMITY Right 03/04/2015   Procedure: IRRIGATION AND DEBRIDEMENT RIGHT FOOT;  Surgeon: Jeb Broach, DPM;  [facility];  Service: Podiatry;  Laterality: Right;   WISDOM TOOTH EXTRACTION     only 2 ext    Allergies  Allergen Reactions   Robinul [Glycopyrrolate]     Caused throat muscles to relax and pt couldn't swallow    LT foot 07/31/2023  Objective/Physical Exam General: The patient is  alert and oriented x3 in no acute distress.  Dermatology:  Wound #1 noted to the plantar aspect of the left fifth MTP measuring approximately 0.7 x 2.0 x 0.2 cm (LxWxD).   To the noted ulceration(s), there is no eschar. There is a moderate amount of slough, fibrin, and necrotic tissue noted. Granulation tissue and wound base is red. There is a minimal amount of serosanguineous drainage noted. There is no exposed bone muscle-tendon ligament or joint. There is no malodor. Periwound integrity is intact. Skin is warm, dry and supple bilateral lower extremities.  Vascular: Palpable pedal pulses bilaterally. No edema or erythema noted. Capillary refill within normal limits.  Neurological: Light touch and protective threshold diminished bilateral  Musculoskeletal Exam: Range of motion within normal limits to all pedal and ankle joints bilateral. Muscle strength 5/5 in all groups bilateral.  History of type indication right foot  Assessment: 1.  Ulcer plantar aspect of the fifth MTP secondary to diabetes mellitus 2.  Encounter for diabetic foot exam   Plan of Care:  -Patient was evaluated.  Comprehensive diabetic foot exam performed today -Medically necessary excisional debridement including subcutaneous tissue was performed using a tissue nipper and a chisel blade. Excisional debridement of all the necrotic nonviable tissue down to  healthy bleeding viable tissue was performed with post-debridement measurements same as pre-. -The wound was cleansed and dry sterile dressing applied. -Prescription for Silvadene  cream apply daily -Offloading felt dancers pads were applied to the insoles of the patient's shoe. -Order also placed to [facility] medical supply for diabetic shoes.  Order placed and patient's chart -Return to clinic 3 weeks  *Goes by [employer]. 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   Thresa EMERSON Sar, DPM [facility]  Dr. Thresa EMERSON Sar, DPM    [address]  [place], [place] [address]                Office [phone]  Fax [phone]

## 2023-08-21 ENCOUNTER — Ambulatory Visit: Admitting: Podiatry

## 2023-08-21 ENCOUNTER — Encounter: Payer: Self-pay | Admitting: Podiatry

## 2023-08-21 VITALS — Ht 74.0 in | Wt 258.4 lb

## 2023-08-21 DIAGNOSIS — E08621 Diabetes mellitus due to underlying condition with foot ulcer: Secondary | ICD-10-CM | POA: Diagnosis not present

## 2023-08-21 DIAGNOSIS — L97522 Non-pressure chronic ulcer of other part of left foot with fat layer exposed: Secondary | ICD-10-CM

## 2023-08-21 DIAGNOSIS — B351 Tinea unguium: Secondary | ICD-10-CM | POA: Diagnosis not present

## 2023-08-21 MED ORDER — TERBINAFINE HCL 250 MG PO TABS: 250.0000 mg | ORAL_TABLET | Freq: Every day | ORAL | 0 refills | Status: DC

## 2023-08-21 NOTE — Progress Notes (Signed)
 Chief Complaint  Patient presents with   Diabetic Ulcer    Pt is here due to left foot due diabetic ulcer.    Subjective:  58 y.o. male with PMHx of diabetes mellitus; controlled presenting for follow-up of ulcer to the plantar aspect of the fifth MTP of the left foot.  Overall improvement.  He also would like to have evaluated his toenails and he is concerned for fungal nail infection.  He has tried different topicals with minimal improvement   Past Medical History:  Diagnosis Date   Broken ankle    Broken arm    Cataracts    Diabetes mellitus without complication (HCC)    type 2   Diabetic retinopathy (HCC)    PDR OU   History of Bell's palsy    Hyperlipidemia    Hypertension    Osteomyelitis (HCC)    foot   Sleep apnea    wears CPAP    Past Surgical History:  Procedure Laterality Date   AMPUTATION Right 03/04/2015   Procedure: PARTIAL AMPUTATION RIGHT 5TH METATARSAL;  Surgeon: Jeb Broach, DPM;  Location: [facility];  Service: Podiatry;  Laterality: Right;   APPENDECTOMY     COLONOSCOPY  11/17/2005   TAs - Armbruster   COLONOSCOPY N/A 01/25/2019   Procedure: COLONOSCOPY;  Surgeon: Kristie Lamprey, MD;  Location: [facility] ENDOSCOPY;  Service: Endoscopy;  Laterality: N/A;   EYE SURGERY Bilateral    blood vessels -cautery   HEMOSTASIS CLIP PLACEMENT  01/25/2019   Procedure: HEMOSTASIS CLIP PLACEMENT;  Surgeon: Kristie Lamprey, MD;  Location: [facility] ENDOSCOPY;  Service: Endoscopy;;   I & D EXTREMITY Right 03/04/2015   Procedure: IRRIGATION AND DEBRIDEMENT RIGHT FOOT;  Surgeon: Jeb Broach, DPM;  Location: [facility];  Service: Podiatry;  Laterality: Right;   WISDOM TOOTH EXTRACTION     only 2 ext    Allergies  Allergen Reactions   Robinul [Glycopyrrolate]     Caused throat muscles to relax and pt couldn't swallow    LT foot 07/31/2023  Objective/Physical Exam General: The patient is alert and oriented x3 in no acute distress.  Dermatology:  Wound #1 noted to the plantar aspect of  the left fifth MTP measuring approximately 0.2 x 0.2 x 0.1 cm (LxWxD).   To the noted ulceration(s), there is no eschar. There is a moderate amount of slough, fibrin, and necrotic tissue noted. Granulation tissue and wound base is red. There is a minimal amount of serosanguineous drainage noted. There is no exposed bone muscle-tendon ligament or joint. There is no malodor. Periwound integrity is intact. Skin is warm, dry and supple bilateral lower extremities.  Hyperkeratotic dystrophic nails also noted 1-5 bilateral consistent with fungal nail infection  Vascular: Palpable pedal pulses bilaterally. No edema or erythema noted. Capillary refill within normal limits.  Neurological: Light touch and protective threshold diminished bilateral  Musculoskeletal Exam: Range of motion within normal limits to all pedal and ankle joints bilateral. Muscle strength 5/5 in all groups bilateral.  History of type indication right foot  Assessment: 1.  Ulcer plantar aspect of the fifth MTP secondary to diabetes mellitus 2.  Fungal nail infection bilateral  Plan of Care:  -Patient was evaluated.   -Medically necessary excisional debridement including subcutaneous tissue was performed using a tissue nipper and a chisel blade. Excisional debridement of all the necrotic nonviable tissue down to healthy bleeding viable tissue was performed with post-debridement measurements same as pre-. -The wound was cleansed and dry sterile dressing applied. - Continue  Silvadene  cream daily - Continue offloading felt dancers pads that were applied to the insoles of the patient's shoe. - Patient received his diabetic shoes and insoles from [employer] medical supply.  Continue wearing.  Refrain from going barefoot - In regards to the fungal nail infection, today we discussed different treatment modalities including oral, topical, and laser antifungal treatment modalities.  Relative efficacies and risks and benefits of each modalities  were discussed.  Patient opts for oral antifungal medication. -Reports that he was seen by his PCP 07/17/2023 [facility] where [facility] was [facility] -Prescription for Lamisil  250 mg #90 daily -Return to clinic PRN  *Goes by [employer]. 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   Thresa EMERSON Sar, DPM [facility]  Dr. Thresa EMERSON Sar, DPM    [address] [place], [place] [address]                Office [phone]  Fax [phone]

## 2023-10-16 ENCOUNTER — Ambulatory Visit: Admitting: Podiatry

## 2023-10-16 DIAGNOSIS — L97522 Non-pressure chronic ulcer of other part of left foot with fat layer exposed: Secondary | ICD-10-CM | POA: Diagnosis not present

## 2023-10-16 DIAGNOSIS — E08621 Diabetes mellitus due to underlying condition with foot ulcer: Secondary | ICD-10-CM | POA: Diagnosis not present

## 2023-10-29 NOTE — Progress Notes (Signed)
 Chief Complaint  Patient presents with   Wound Check    Follow up ulcer on the left foot. He also states he has callous that needs attention    Subjective:  58 y.o. male with PMHx of diabetes mellitus presenting today for evaluation of callus left foot.  Last seen in the office 08/21/2023.  Patient states that the callus has returned.  History of ulcer to the same area   Past Medical History:  Diagnosis Date   Broken ankle    Broken arm    Cataracts    Diabetes mellitus without complication (HCC)    type 2   Diabetic retinopathy (HCC)    PDR OU   History of Bell's palsy    Hyperlipidemia    Hypertension    Osteomyelitis (HCC)    foot   Sleep apnea    wears CPAP    Past Surgical History:  Procedure Laterality Date   AMPUTATION Right 03/04/2015   Procedure: PARTIAL AMPUTATION RIGHT 5TH METATARSAL;  Surgeon: Jeb Broach, DPM;  [facility]: [facility];  Service: Podiatry;  Laterality: Right;   APPENDECTOMY     COLONOSCOPY  11/17/2005   TAs - Armbruster   COLONOSCOPY N/A 01/25/2019   Procedure: COLONOSCOPY;  Surgeon: Kristie Lamprey, MD;  Location: [facility] ENDOSCOPY;  Service: Endoscopy;  Laterality: N/A;   EYE SURGERY Bilateral    blood vessels -cautery   HEMOSTASIS CLIP PLACEMENT  01/25/2019   Procedure: HEMOSTASIS CLIP PLACEMENT;  Surgeon: Kristie Lamprey, MD;  Location: [facility] ENDOSCOPY;  Service: Endoscopy;;   I & D EXTREMITY Right 03/04/2015   Procedure: IRRIGATION AND DEBRIDEMENT RIGHT FOOT;  Surgeon: Jeb Broach, DPM;  Location: [facility] OR;  Service: Podiatry;  Laterality: Right;   WISDOM TOOTH EXTRACTION     only 2 ext    Allergies  Allergen Reactions   Robinul [Glycopyrrolate]     Caused throat muscles to relax and pt couldn't swallow     Objective/Physical Exam General: The patient is alert and oriented x3 in no acute distress.  Dermatology:  Wound #1 noted to the plantar aspect of the fifth MTP left foot measuring approximately 1.0 x 1.0 x 0.2 cm (LxWxD).   To the noted  ulceration(s), there is no eschar. There is a moderate amount of slough, fibrin, and necrotic tissue noted. Granulation tissue and wound base is red. There is a minimal amount of serosanguineous drainage noted. There is no exposed bone muscle-tendon ligament or joint. There is no malodor. Periwound integrity is intact. Skin is warm, dry and supple bilateral lower extremities.  Vascular: Palpable pedal pulses bilaterally. No edema or erythema noted. Capillary refill within normal limits.  Neurological: Light touch and protective threshold diminished bilaterally.   Musculoskeletal Exam: Range of motion within normal limits to all pedal and ankle joints bilateral. Muscle strength 5/5 in all groups bilateral.  No prior amputations  Assessment: 1.  Ulcer plantar aspect of the left foot secondary to diabetes mellitus 2. diabetes mellitus w/ peripheral neuropathy   Plan of Care:  1. Patient was evaluated. 2. medically necessary excisional debridement including subcutaneous tissue was performed using a tissue nipper and a chisel blade. Excisional debridement of all the necrotic nonviable tissue down to healthy bleeding viable tissue was performed with post-debridement measurements same as pre-. 3. the wound was cleansed and dry sterile dressing applied.  Post care instructions provided 4. patient is to return to clinic in 3 weeks   Thresa EMERSON Sar, DPM [facility]  Dr.  Thresa EMERSON Sar, DPM    [address] [place], [place] [address]                Office [phone]  Fax [phone]

## 2023-11-13 ENCOUNTER — Encounter: Payer: Self-pay | Admitting: Podiatry

## 2023-11-13 ENCOUNTER — Ambulatory Visit: Admitting: Podiatry

## 2023-11-13 ENCOUNTER — Ambulatory Visit (INDEPENDENT_AMBULATORY_CARE_PROVIDER_SITE_OTHER)

## 2023-11-13 VITALS — Ht 74.0 in | Wt 258.4 lb

## 2023-11-13 DIAGNOSIS — L97522 Non-pressure chronic ulcer of other part of left foot with fat layer exposed: Secondary | ICD-10-CM

## 2023-11-13 DIAGNOSIS — M216X2 Other acquired deformities of left foot: Secondary | ICD-10-CM | POA: Diagnosis not present

## 2023-11-13 DIAGNOSIS — E08621 Diabetes mellitus due to underlying condition with foot ulcer: Secondary | ICD-10-CM

## 2023-11-13 NOTE — Progress Notes (Signed)
 Chief Complaint  Patient presents with   Diabetic Ulcer    Pt is here to f/u on the left foot due to ulcer, he states no pain, but area is not healing as it should.    Subjective:  58 y.o. male with PMHx of diabetes mellitus presenting today for evaluation of callus left foot.  Patient has been offloading with shoes and felt padding as well as applying the antibiotic cream and a dressing.  Slow steady improvement.  He is concerned due to history of right foot osteomyelitis.  He has now had the wound since earlier this year July 2025.   Past Medical History:  Diagnosis Date   Broken ankle    Broken arm    Cataracts    Diabetes mellitus without complication (HCC)    type 2   Diabetic retinopathy (HCC)    PDR OU   History of Bell's palsy    Hyperlipidemia    Hypertension    Osteomyelitis (HCC)    foot   Sleep apnea    wears CPAP    Past Surgical History:  Procedure Laterality Date   AMPUTATION Right 03/04/2015   Procedure: PARTIAL AMPUTATION RIGHT 5TH METATARSAL;  Surgeon: Jeb Broach, DPM;  [facility]: [facility];  Service: Podiatry;  Laterality: Right;   APPENDECTOMY     COLONOSCOPY  11/17/2005   TAs - Armbruster   COLONOSCOPY N/A 01/25/2019   Procedure: COLONOSCOPY;  Surgeon: Kristie Lamprey, MD;  Location: [facility] ENDOSCOPY;  Service: Endoscopy;  Laterality: N/A;   EYE SURGERY Bilateral    blood vessels -cautery   HEMOSTASIS CLIP PLACEMENT  01/25/2019   Procedure: HEMOSTASIS CLIP PLACEMENT;  Surgeon: Kristie Lamprey, MD;  Location: [facility] ENDOSCOPY;  Service: Endoscopy;;   I & D EXTREMITY Right 03/04/2015   Procedure: IRRIGATION AND DEBRIDEMENT RIGHT FOOT;  Surgeon: Jeb Broach, DPM;  Location: [facility];  Service: Podiatry;  Laterality: Right;   WISDOM TOOTH EXTRACTION     only 2 ext    Allergies  Allergen Reactions   Robinul [Glycopyrrolate]     Caused throat muscles to relax and pt couldn't swallow     LT foot 11/13/2023  Objective/Physical Exam General: The patient is alert and  oriented x3 in no acute distress.  Dermatology:  Stable.  Wound #1 noted to the plantar aspect of the fifth MTP left foot measuring approximately 1.0 x 1.0 x 0.2 cm (LxWxD).   To the noted ulceration(s), there is no eschar. There is a moderate amount of slough, fibrin, and necrotic tissue noted. Granulation tissue and wound base is red. There is a minimal amount of serosanguineous drainage noted. There is no exposed bone muscle-tendon ligament or joint. There is no malodor. Periwound integrity is intact. Skin is warm, dry and supple bilateral lower extremities.  Vascular: Palpable pedal pulses bilaterally. No edema or erythema noted. Capillary refill within normal limits.  Neurological: Light touch and protective threshold diminished bilaterally.   Musculoskeletal Exam: Chronic excessive loading to the fifth MTP of the left foot.  Assessment: 1.  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  diabetes mellitus w/ peripheral neuropathy 3.  PSxHx partial fifth ray amputation RT. Dr. Broach. DOS 03/04/2015 4.  PSxHx planing procedure of the fifth metatarsal tubercle RT. Dr. Thresa CHRISTELLA Sar. DOS 02/12/2020  Plan of Care:  -Patient was evaluated.  X-rays reviewed -Medically necessary excisional debridement including subcutaneous tissue was performed using a tissue nipper and a chisel blade. Excisional debridement of all  the necrotic nonviable tissue down to healthy bleeding viable tissue was performed with post-debridement measurements same as pre-. -Unfortunately patient continues to have chronic wound to the plantar aspect of the fifth MTP despite ongoing conservative care and management.  I do believe that surgery is warranted at this time to alleviate pressure from the fifth metatarsal phalangeal joint.  We discussed different surgical approaches including fifth metatarsal head resection versus a floating osteotomy of the fifth metatarsal.  I do believe that a floating osteotomy would  be beneficial and a more simple approach versus a complete fifth metatarsal head resection.  Surgery was discussed in detail with the patient.  No guarantees were expressed or implied.  Risk benefits advantages and disadvantages were explained.  All patient questions answered.  After discussion with the patient he would like to proceed with surgery to alleviate pressure from the fifth metatarsal tubercle which consistently presents possibility of infection due to the underlying ulcer -Authorization for surgery was initiated today.  Surgery will consist of fifth metatarsal floating osteotomy left -Return to clinic 1 week postop   *Goes by [employer].  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   Thresa EMERSON Sar, DPM [facility]  Dr. Thresa EMERSON Sar, DPM    [address] [place], [place] [address]                Office [phone]  Fax [phone]

## 2023-11-21 ENCOUNTER — Telehealth: Payer: Self-pay | Admitting: Podiatry

## 2023-11-21 NOTE — Telephone Encounter (Signed)
 Called and scheduled patient surgery for 01/17/2024. Patient is on Mounjaro and has been advised to d/c use 1 week prior. Patient not on any blood thinners. Patient pharmacy correct in chart

## 2023-12-25 ENCOUNTER — Encounter (HOSPITAL_COMMUNITY): Payer: Self-pay

## 2023-12-25 ENCOUNTER — Other Ambulatory Visit: Payer: Self-pay

## 2023-12-25 ENCOUNTER — Inpatient Hospital Stay
Admission: AD | Admit: 2023-12-25 | Discharge: 2023-12-28 | Disposition: A | Discharge status: Home / Self Care | Attending: Obstetrics and Gynecology | Admitting: Obstetrics and Gynecology

## 2023-12-25 ENCOUNTER — Ambulatory Visit: Admitting: Podiatry

## 2023-12-25 ENCOUNTER — Encounter: Payer: Self-pay | Admitting: Podiatry

## 2023-12-25 ENCOUNTER — Ambulatory Visit (INDEPENDENT_AMBULATORY_CARE_PROVIDER_SITE_OTHER)

## 2023-12-25 ENCOUNTER — Encounter: Payer: Self-pay | Admitting: Hospitalist

## 2023-12-25 ENCOUNTER — Inpatient Hospital Stay

## 2023-12-25 VITALS — Ht 74.0 in | Wt 258.4 lb

## 2023-12-25 DIAGNOSIS — E08621 Diabetes mellitus due to underlying condition with foot ulcer: Secondary | ICD-10-CM

## 2023-12-25 DIAGNOSIS — Z79899 Other long term (current) drug therapy: Secondary | ICD-10-CM

## 2023-12-25 DIAGNOSIS — Z89421 Acquired absence of other right toe(s): Secondary | ICD-10-CM

## 2023-12-25 DIAGNOSIS — E66811 Obesity, class 1: Secondary | ICD-10-CM | POA: Diagnosis present

## 2023-12-25 DIAGNOSIS — M8610 Other acute osteomyelitis, unspecified site: Principal | ICD-10-CM

## 2023-12-25 DIAGNOSIS — E785 Hyperlipidemia, unspecified: Secondary | ICD-10-CM | POA: Diagnosis present

## 2023-12-25 DIAGNOSIS — I10 Essential (primary) hypertension: Secondary | ICD-10-CM | POA: Diagnosis present

## 2023-12-25 DIAGNOSIS — Z888 Allergy status to other drugs, medicaments and biological substances status: Secondary | ICD-10-CM | POA: Diagnosis not present

## 2023-12-25 DIAGNOSIS — M86172 Other acute osteomyelitis, left ankle and foot: Secondary | ICD-10-CM | POA: Diagnosis present

## 2023-12-25 DIAGNOSIS — E11628 Type 2 diabetes mellitus with other skin complications: Secondary | ICD-10-CM

## 2023-12-25 DIAGNOSIS — Z794 Long term (current) use of insulin: Secondary | ICD-10-CM | POA: Diagnosis not present

## 2023-12-25 DIAGNOSIS — E113593 Type 2 diabetes mellitus with proliferative diabetic retinopathy without macular edema, bilateral: Secondary | ICD-10-CM | POA: Diagnosis present

## 2023-12-25 DIAGNOSIS — Z7985 Long-term (current) use of injectable non-insulin antidiabetic drugs: Secondary | ICD-10-CM | POA: Diagnosis not present

## 2023-12-25 DIAGNOSIS — E11621 Type 2 diabetes mellitus with foot ulcer: Secondary | ICD-10-CM | POA: Diagnosis present

## 2023-12-25 DIAGNOSIS — Z6831 Body mass index (BMI) 31.0-31.9, adult: Secondary | ICD-10-CM

## 2023-12-25 DIAGNOSIS — M869 Osteomyelitis, unspecified: Secondary | ICD-10-CM | POA: Diagnosis not present

## 2023-12-25 DIAGNOSIS — Z7984 Long term (current) use of oral hypoglycemic drugs: Secondary | ICD-10-CM

## 2023-12-25 DIAGNOSIS — B954 Other streptococcus as the cause of diseases classified elsewhere: Secondary | ICD-10-CM | POA: Diagnosis not present

## 2023-12-25 DIAGNOSIS — Z833 Family history of diabetes mellitus: Secondary | ICD-10-CM | POA: Diagnosis not present

## 2023-12-25 DIAGNOSIS — L97522 Non-pressure chronic ulcer of other part of left foot with fat layer exposed: Secondary | ICD-10-CM | POA: Diagnosis not present

## 2023-12-25 DIAGNOSIS — E1169 Type 2 diabetes mellitus with other specified complication: Secondary | ICD-10-CM | POA: Diagnosis present

## 2023-12-25 DIAGNOSIS — E119 Type 2 diabetes mellitus without complications: Secondary | ICD-10-CM

## 2023-12-25 DIAGNOSIS — L97529 Non-pressure chronic ulcer of other part of left foot with unspecified severity: Secondary | ICD-10-CM | POA: Diagnosis present

## 2023-12-25 DIAGNOSIS — G4733 Obstructive sleep apnea (adult) (pediatric): Secondary | ICD-10-CM | POA: Diagnosis present

## 2023-12-25 LAB — SEDIMENTATION RATE: Sed Rate: 51 mm/h — ABNORMAL HIGH (ref 0–20)

## 2023-12-25 LAB — GLUCOSE, CAPILLARY
Glucose-Capillary: 146 mg/dL — ABNORMAL HIGH (ref 70–99)
Glucose-Capillary: 162 mg/dL — ABNORMAL HIGH (ref 70–99)

## 2023-12-25 LAB — MAGNESIUM: Magnesium: 2.7 mg/dL — ABNORMAL HIGH (ref 1.7–2.4)

## 2023-12-25 LAB — C-REACTIVE PROTEIN: CRP: 7.7 mg/dL — ABNORMAL HIGH

## 2023-12-25 MED ORDER — INSULIN ASPART 100 UNIT/ML IJ SOLN: 0.0000 [IU] | Freq: Every day | INTRAMUSCULAR | Status: DC

## 2023-12-25 MED ORDER — ONDANSETRON HCL 4 MG PO TABS: 4.0000 mg | ORAL_TABLET | Freq: Four times a day (QID) | ORAL | Status: DC | PRN

## 2023-12-25 MED ORDER — HYDROMORPHONE HCL 1 MG/ML IJ SOLN
0.5000 mg | INTRAMUSCULAR | Status: DC | PRN
  Administered 2023-12-27: 1 mg via INTRAVENOUS
  Filled 2023-12-25: qty 1

## 2023-12-25 MED ORDER — INSULIN ASPART 100 UNIT/ML IJ SOLN
0.0000 [IU] | Freq: Three times a day (TID) | INTRAMUSCULAR | Status: DC
  Administered 2023-12-26: 2 [IU] via SUBCUTANEOUS
  Administered 2023-12-27: 5 [IU] via SUBCUTANEOUS
  Administered 2023-12-27 – 2023-12-28 (×3): 3 [IU] via SUBCUTANEOUS
  Administered 2023-12-28: 2 [IU] via SUBCUTANEOUS
  Filled 2023-12-25: qty 2
  Filled 2023-12-25: qty 5
  Filled 2023-12-25: qty 2
  Filled 2023-12-25 (×2): qty 3
  Filled 2023-12-25: qty 2

## 2023-12-25 MED ORDER — SENNOSIDES-DOCUSATE SODIUM 8.6-50 MG PO TABS: 1.0000 | ORAL_TABLET | Freq: Every evening | ORAL | Status: DC | PRN

## 2023-12-25 MED ORDER — OXYCODONE HCL 5 MG PO TABS
5.0000 mg | ORAL_TABLET | ORAL | Status: DC | PRN
  Administered 2023-12-25 – 2023-12-28 (×3): 5 mg via ORAL
  Filled 2023-12-25 (×3): qty 1

## 2023-12-25 MED ORDER — SODIUM CHLORIDE 0.9% FLUSH
3.0000 mL | Freq: Two times a day (BID) | INTRAVENOUS | Status: DC
  Administered 2023-12-25 – 2023-12-28 (×5): 3 mL via INTRAVENOUS

## 2023-12-25 MED ORDER — ACETAMINOPHEN 325 MG PO TABS: 650.0000 mg | ORAL_TABLET | Freq: Four times a day (QID) | ORAL | Status: DC | PRN

## 2023-12-25 MED ORDER — ONDANSETRON HCL 4 MG/2ML IJ SOLN: 4.0000 mg | Freq: Four times a day (QID) | INTRAMUSCULAR | Status: DC | PRN

## 2023-12-25 MED ORDER — ACETAMINOPHEN 650 MG RE SUPP: 650.0000 mg | Freq: Four times a day (QID) | RECTAL | Status: DC | PRN

## 2023-12-25 MED ORDER — HEPARIN SODIUM (PORCINE) 5000 UNIT/ML IJ SOLN
5000.0000 [IU] | Freq: Three times a day (TID) | INTRAMUSCULAR | Status: AC
  Administered 2023-12-25 – 2023-12-26 (×2): 5000 [IU] via SUBCUTANEOUS
  Filled 2023-12-25 (×2): qty 1

## 2023-12-25 NOTE — H&P (Signed)
 " History and Physical    [name] FMW:[id number] DOB: September 17, 1965 DOA: 12/25/2023  DOS: the patient was seen and examined on 12/25/2023  PCP: Janey Santos, MD   Patient coming from: Home  I have personally briefly reviewed patient's old medical records in [name] and [name]  HPI:   [name] is a 58 y.o. year old male with medical history of hypertension, hyperlipidemia, type 2 diabetes, history of diabetic foot infections currently admitted on podiatry's recommendation for potential amputation.  Patient states he has been having worsening left foot pain and drainage since about 3 weeks.  He has been in close follow-up with podiatry who initially were planning for surgical intervention on 01/17/2023 but given today's evaluation they recommend urgent intervention.  Patient denies any fevers or chills.  He denies any URI symptoms.  Review of Systems: As mentioned in the history of present illness. All other systems reviewed and are negative.   Past Medical History:  Diagnosis Date   Broken ankle    Broken arm    Cataracts    Diabetes mellitus without complication (HCC)    type 2   Diabetic retinopathy (HCC)    PDR OU   History of Bell's palsy    Hyperlipidemia    Hypertension    Osteomyelitis (HCC)    foot   Sleep apnea    wears CPAP    Past Surgical History:  Procedure Laterality Date   AMPUTATION Right 03/04/2015   Procedure: PARTIAL AMPUTATION RIGHT 5TH METATARSAL;  Surgeon: Jeb Broach, DPM;  Location: [facility] OR;  Service: Podiatry;  Laterality: Right;   APPENDECTOMY     COLONOSCOPY  11/17/2005   TAs - Armbruster   COLONOSCOPY N/A 01/25/2019   Procedure: COLONOSCOPY;  Surgeon: Kristie Lamprey, MD;  Location: [name] ENDOSCOPY;  Service: Endoscopy;  Laterality: N/A;   EYE SURGERY Bilateral    blood vessels -cautery   HEMOSTASIS CLIP PLACEMENT  01/25/2019   Procedure: HEMOSTASIS CLIP PLACEMENT;  Surgeon: Kristie Lamprey, MD;  Location: [facility]  ENDOSCOPY;  Service: Endoscopy;;   I & D EXTREMITY Right 03/04/2015   Procedure: IRRIGATION AND DEBRIDEMENT RIGHT FOOT;  Surgeon: Jeb Broach, DPM;  Location: [facility] OR;  Service: Podiatry;  Laterality: Right;   WISDOM TOOTH EXTRACTION     only 2 ext     Allergies[1]  Family History  Problem Relation Age of Onset   Diabetes Father    Colon cancer Father    Colon polyps Father    Esophageal cancer Neg Hx    Rectal cancer Neg Hx    Stomach cancer Neg Hx    Sleep apnea Neg Hx     Prior to Admission medications  Medication Sig Start Date End Date Taking? Authorizing Provider  Continuous Blood Gluc Sensor (FREESTYLE LIBRE 14 DAY SENSOR) MISC SMARTSIG:1 Topical Every 2 Weeks 06/20/19   [provider]  empagliflozin (JARDIANCE) 25 MG TABS tablet Take 25 mg by mouth daily.    [provider]  fluticasone (FLONASE) 50 MCG/ACT nasal spray Place 2 sprays into both nostrils daily as needed for allergies or rhinitis.    [provider]  HUMALOG KWIKPEN 200 UNIT/ML KwikPen SMARTSIG:15 Unit(s) SUB-Q 3 Times Daily 01/06/20   [provider]  ibuprofen (ADVIL) 200 MG tablet as needed. 12/24/08   [provider]  Insulin  Degludec (TRESIBA FLEXTOUCH) 200 UNIT/ML SOPN Inject 100 Units into the skin daily before breakfast.     [provider]  LYUMJEV KWIKPEN 100 UNIT/ML  KwikPen Lyumjev Rx for Novolog  is 22 units prior to breakfast, 18 units with lunch, 26 units with dinner Subcutaneous as directed; Duration: 30 days    [provider]  metFORMIN  (GLUCOPHAGE ) 1000 MG tablet Take 1,000 mg by mouth 2 (two) times daily. 11/09/14   [provider]  MOUNJARO 12.5 MG/0.5ML Pen Inject 12.5 mg into the skin once a week.    [provider]  Multiple Vitamin (MULTIVITAMIN) tablet Take 1 tablet by mouth daily.    [provider]  Olmesartan -Amlodipine -HCTZ 40-10-25 MG TABS Take 1 tablet by mouth daily. Reported on 02/04/2015 12/28/14    [provider]  omega-3 acid ethyl esters (LOVAZA ) 1 g capsule Take 1 capsule by mouth 2 (two) times daily.    [provider]  oxyCODONE -acetaminophen  (PERCOCET) 5-325 MG tablet Take 1 tablet by mouth every 4 (four) hours as needed for severe pain. 02/12/20   Janit Thresa HERO, DPM  rosuvastatin  (CRESTOR ) 10 MG tablet Take 10 mg by mouth daily. 01/03/19   [provider]  sildenafil (VIAGRA) 100 MG tablet One by mouth daily when necessary 04/18/17   [provider]  silver  sulfADIAZINE  (SILVADENE ) 1 % cream Apply 1 Application topically daily. 07/31/23   Janit Thresa HERO, DPM  terbinafine  (LAMISIL ) 250 MG tablet Take 1 tablet (250 mg total) by mouth daily. 08/21/23   Janit Thresa HERO, DPM    Social History:  reports that he has never smoked. He has never used smokeless tobacco. He reports that he does not drink alcohol and does not use drugs.    Physical Exam: Vitals:   12/25/23 1608  BP: (!) 134/58  Pulse: 77  Resp: 20  Temp: 99.3 F (37.4 C)  SpO2: 99%    Gen: NAD HENT: NCAT CV: normal heart sounds Lung: CTAB Abd: No TTP, normal bowel sounds MSK: No asymmetry, good bulk and tone, left foot with bandage in place.  See podiatry note for detailed picture of the concerning ulcer. Neuro: alert and oriented   Labs on Admission: I have personally reviewed following labs and imaging studies  CBC: No results for input(s): WBC, NEUTROABS, HGB, HCT, MCV, PLT in the last 168 hours. Basic Metabolic Panel: No results for input(s): NA, K, CL, CO2, GLUCOSE, BUN, CREATININE, CALCIUM , MG, PHOS in the last 168 hours. GFR: CrCl cannot be calculated (Patient's most recent lab result is older than the maximum 21 days allowed.). Liver Function Tests: No results for input(s): AST, ALT, ALKPHOS, BILITOT, PROT, ALBUMIN in the last 168 hours. No results for input(s): LIPASE, AMYLASE in the last 168 hours. No results for  input(s): AMMONIA in the last 168 hours. Coagulation Profile: No results for input(s): INR, PROTIME in the last 168 hours. Cardiac Enzymes: No results for input(s): CKTOTAL, CKMB, CKMBINDEX, TROPONINI, TROPONINIHS in the last 168 hours. BNP (last 3 results) No results for input(s): BNP in the last 8760 hours. HbA1C: No results for input(s): HGBA1C in the last 72 hours. CBG: No results for input(s): GLUCAP in the last 168 hours. Lipid Profile: No results for input(s): CHOL, HDL, LDLCALC, TRIG, CHOLHDL, LDLDIRECT in the last 72 hours. Thyroid Function Tests: No results for input(s): TSH, T4TOTAL, FREET4, T3FREE, THYROIDAB in the last 72 hours. Anemia Panel: No results for input(s): VITAMINB12, FOLATE, FERRITIN, TIBC, IRON, RETICCTPCT in the last 72 hours. Urine analysis:    Component Value Date/Time   COLORURINE YELLOW 03/10/2015 1408   APPEARANCEUR CLEAR 03/10/2015 1408   LABSPEC 1.027 03/10/2015 1408  PHURINE 5.0 03/10/2015 1408   GLUCOSEU >1000 (A) 03/10/2015 1408   HGBUR NEGATIVE 03/10/2015 1408   BILIRUBINUR NEGATIVE 03/10/2015 1408   KETONESUR 40 (A) 03/10/2015 1408   PROTEINUR NEGATIVE 03/10/2015 1408   NITRITE NEGATIVE 03/10/2015 1408   LEUKOCYTESUR NEGATIVE 03/10/2015 1408    Radiological Exams on Admission: I have personally reviewed images DG Foot Complete Left Result Date: 12/25/2023 Please see detailed radiograph report in office note.   EKG: My personal interpretation of EKG shows: Pending   Assessment/Plan Principal Problem:   Acute osteomyelitis (HCC) Active Problems:   Type II diabetes mellitus (HCC)   Hyperlipidemia   Hypertension   OSA on CPAP   Patient with diabetic foot infection that has appears to have progressed to osteomyelitis.  Initial imaging concerning for osteomyelitis. MRI ordered. ESR and CRP ordered.  Podiatry consulted and they have evaluated this in the clinic setting.   Appreciate recommendations.  Holding antibiotics currently given patient is hemodynamically stable.  Blood cultures obtained.  Will trend CBC, monitor fever curve.  Blood cultures pending. NPO after midnight order placed in case the needs surgical intervention tomorrow.  Type 2 diabetes mellitus: Holding home regimen and starting patient on SSI.  HTN: Patient on olmesartan -amlodipine -HCTZ combo pill.  Will hold given active infection and n.p.o. status.  Will monitor his blood pressure and use IV medications as needed.  Will treat pain appropriately.  Hyperlipidemia: Continue home med.  OSA on CPAP: Continue nightly CPAP.  VTE prophylaxis:  SQ Heparin   Diet: NPO after midnight Code Status:  Full Code Telemetry:  Admission status: Inpatient, Med-Surg Patient is from: Home Anticipated d/c is to: Home Anticipated d/c is in: 3-4 days   Family Communication: Updated at bedside  Consults called: Podiatry   Severity of Illness: The appropriate patient status for this patient is INPATIENT. Inpatient status is judged to be reasonable and necessary in order to provide the required intensity of service to ensure the patient's safety. The patient's presenting symptoms, physical exam findings, and initial radiographic and laboratory data in the context of their chronic comorbidities is felt to place them at high risk for further clinical deterioration. Furthermore, it is not anticipated that the patient will be medically stable for discharge from the hospital within 2 midnights of admission.   * I certify that at the point of admission it is my clinical judgment that the patient will require inpatient hospital care spanning beyond 2 midnights from the point of admission due to high intensity of service, high risk for further deterioration and high frequency of surveillance required.DEWAINE Morene Bathe, MD Jolynn DEL. [name]     [1]  Allergies Allergen Reactions   Robinul  [Glycopyrrolate]     Caused throat muscles to relax and pt couldn't swallow   "

## 2023-12-25 NOTE — Progress Notes (Signed)
 "  Chief Complaint  Patient presents with   Diabetic Ulcer    Pt is here due to diabetic ulcer to the left foot, states in the last 2 weeks the area has been draining more then normal, hurts to walk on, foot is red and tender to touch, he is schedule for surgery on 1/15.   Subjective:  58 y.o. 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  Presenting today with his wife who is very concerned.  Onset of the redness with drainage about 3 weeks ago.   Past Medical History:  Diagnosis Date   Broken ankle    Broken arm    Cataracts    Diabetes mellitus without complication (HCC)    type 2   Diabetic retinopathy (HCC)    PDR OU   History of Bell's palsy    Hyperlipidemia    Hypertension    Osteomyelitis (HCC)    foot   Sleep apnea    wears CPAP    Past Surgical History:  Procedure Laterality Date   AMPUTATION Right 03/04/2015   Procedure: PARTIAL AMPUTATION RIGHT 5TH METATARSAL;  Surgeon: Jeb Broach, DPM;  Location: [facility] OR;  Service: Podiatry;  Laterality: Right;   APPENDECTOMY     COLONOSCOPY  11/17/2005   TAs - Armbruster   COLONOSCOPY N/A 01/25/2019   Procedure: COLONOSCOPY;  Surgeon: Kristie Lamprey, MD;  Location[facility] ENDOSCOPY;  Service: Endoscopy;  Laterality: N/A;   EYE SURGERY Bilateral    blood vessels -cautery   HEMOSTASIS CLIP PLACEMENT  01/25/2019   Procedure: HEMOSTASIS CLIP PLACEMENT;  Surgeon: Kristie Lamprey, MD;  Location: [facility] ENDOSCOPY;  Service: Endoscopy;;   I & D EXTREMITY Right 03/04/2015   Procedure: IRRIGATION AND DEBRIDEMENT RIGHT FOOT;  Surgeon: Jeb Broach, DPM;  Location: [facility] OR;  Service: Podiatry;  Laterality: Right;   WISDOM TOOTH EXTRACTION     only 2 ext    Allergies  Allergen Reactions   Robinul [Glycopyrrolate]     Caused throat muscles to relax and pt couldn't swallow     LT  foot 12/25/2023  LT foot 12/25/2023  Objective/Physical Exam General: The patient is alert and oriented x3 in no acute distress.  Dermatology:  Chronic diabetic foot ulcer plantar aspect of the fifth MTP left foot with increased surrounding erythema and edema and serous drainage.  The wound probes to bone.  Mild malodor  Vascular: Palpable pedal pulses bilaterally.  Erythema and edema noted.  Capillary refill WNL.  Clinically no concern for vascular compromise  Neurological: Light touch and protective threshold absent  Musculoskeletal Exam: Chronic excessive loading to the fifth MTP of the left foot. H/o partial fifth ray amputation right.   Radiographic exam LT foot 12/25/2023: Subtle osseous irregularities and erosions noted around the fifth metatarsal head compared to prior x-rays consistent with acute osteomyelitis  Assessment: 1.  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  diabetes mellitus w/ peripheral neuropathy 3.  PSxHx partial fifth ray amputation RT. Dr. Broach. DOS 03/04/2015 4.  Acute onset of osteomyelitis fifth MTP left foot  Plan of Care:  -Patient was evaluated.  X-rays reviewed consistent with osteomyelitis - Due to the acute onset of the osteomyelitis and increased redness to the foot as well as concern from the family I do believe that he would benefit  from direct admission for MRI and surgery which would likely consist of either fifth metatarsal head resection or partial fifth ray amputation depending on MRI results.  He is scheduled for surgery with me on 01/17/2023 however I do believe that more emergent and prompt treatment is appropriate. -Recommend admission to the [facility] hospital to initiate IV antibiotics and schedule inpatient surgery.  They are amenable to this plan.  Direct admit line contacted for direct admission under the hospitalist service.  Greatly appreciated -Once admitted recommend MRI LT toes wo contrast -We will plan for surgery  tentatively for tomorrow, 12/26/2023 pending MRI results.  -If possible hold IV antibiotics for bone culture IntraOp tomorrow -Please consult podiatry once admitted.  I am on-call for this week so I will plan to see the patient tomorrow preoperatively  *Goes by [facility].  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   Thresa EMERSON Sar, DPM [facility]  Dr. Thresa EMERSON Sar, DPM    [address] [place], [place] [address]                Office [phone]  Fax [phone]     "

## 2023-12-25 NOTE — Plan of Care (Signed)

## 2023-12-25 NOTE — Plan of Care (Signed)

## 2023-12-26 ENCOUNTER — Inpatient Hospital Stay

## 2023-12-26 ENCOUNTER — Encounter
Admission: AD | Disposition: A | Discharge status: Home Health | Payer: Self-pay | Source: Home / Self Care | Attending: Obstetrics and Gynecology

## 2023-12-26 ENCOUNTER — Inpatient Hospital Stay: Admitting: Anesthesiology

## 2023-12-26 DIAGNOSIS — M8610 Other acute osteomyelitis, unspecified site: Secondary | ICD-10-CM | POA: Diagnosis not present

## 2023-12-26 DIAGNOSIS — L97529 Non-pressure chronic ulcer of other part of left foot with unspecified severity: Secondary | ICD-10-CM | POA: Diagnosis not present

## 2023-12-26 DIAGNOSIS — E11621 Type 2 diabetes mellitus with foot ulcer: Secondary | ICD-10-CM | POA: Diagnosis not present

## 2023-12-26 DIAGNOSIS — E785 Hyperlipidemia, unspecified: Secondary | ICD-10-CM | POA: Diagnosis not present

## 2023-12-26 DIAGNOSIS — M86172 Other acute osteomyelitis, left ankle and foot: Secondary | ICD-10-CM | POA: Diagnosis not present

## 2023-12-26 HISTORY — PX: AMPUTATION: SHX166

## 2023-12-26 LAB — GLUCOSE, CAPILLARY
Glucose-Capillary: 140 mg/dL — ABNORMAL HIGH (ref 70–99)
Glucose-Capillary: 137 mg/dL — ABNORMAL HIGH (ref 70–99)
Glucose-Capillary: 144 mg/dL — ABNORMAL HIGH (ref 70–99)
Glucose-Capillary: 136 mg/dL — ABNORMAL HIGH (ref 70–99)

## 2023-12-26 LAB — BASIC METABOLIC PANEL WITH GFR
Anion gap: 12 (ref 5–15)
BUN: 21 mg/dL — ABNORMAL HIGH (ref 6–20)
CO2: 25 mmol/L (ref 22–32)
Calcium: 8.5 mg/dL — ABNORMAL LOW (ref 8.9–10.3)
Chloride: 100 mmol/L (ref 98–111)
Creatinine, Ser: 1.04 mg/dL (ref 0.61–1.24)
GFR, Estimated: >60 mL/min
Glucose, Bld: 148 mg/dL — ABNORMAL HIGH (ref 70–99)
Potassium: 4.4 mmol/L (ref 3.5–5.1)
Sodium: 137 mmol/L (ref 135–145)

## 2023-12-26 LAB — CBC
HCT: 42.3 % (ref 39.0–52.0)
Hemoglobin: 14.4 g/dL (ref 13.0–17.0)
MCH: 31.2 pg (ref 26.0–34.0)
MCHC: 34 g/dL (ref 30.0–36.0)
MCV: 91.6 fL (ref 80.0–100.0)
Platelets: 284 K/uL (ref 150–400)
RBC: 4.62 MIL/uL (ref 4.22–5.81)
RDW: 12.9 % (ref 11.5–15.5)
WBC: 9.5 K/uL (ref 4.0–10.5)
nRBC: 0 % (ref 0.0–0.2)

## 2023-12-26 LAB — HEMOGLOBIN A1C
Hgb A1c MFr Bld: 6.6 % — ABNORMAL HIGH (ref 4.8–5.6)
Mean Plasma Glucose: 142.72 mg/dL

## 2023-12-26 LAB — HIV ANTIBODY (ROUTINE TESTING W REFLEX): HIV Screen 4th Generation wRfx: NONREACTIVE

## 2023-12-26 MED ORDER — LIDOCAINE HCL (PF) 1 % IJ SOLN
INTRAMUSCULAR | Status: AC
  Filled 2023-12-26: qty 30

## 2023-12-26 MED ORDER — MIDAZOLAM HCL 2 MG/2ML IJ SOLN
INTRAMUSCULAR | Status: AC
  Filled 2023-12-26: qty 2

## 2023-12-26 MED ORDER — VANCOMYCIN HCL 1500 MG/300ML IV SOLN
1500.0000 mg | Freq: Two times a day (BID) | INTRAVENOUS | Status: DC
  Administered 2023-12-27 – 2023-12-28 (×4): 1500 mg via INTRAVENOUS
  Filled 2023-12-26 (×4): qty 300

## 2023-12-26 MED ORDER — LIDOCAINE HCL (PF) 2 % IJ SOLN
INTRAMUSCULAR | Status: DC | PRN
  Administered 2023-12-26: 100 mg via INTRADERMAL

## 2023-12-26 MED ORDER — BUPIVACAINE HCL (PF) 0.5 % IJ SOLN
INTRAMUSCULAR | Status: AC
  Filled 2023-12-26: qty 30

## 2023-12-26 MED ORDER — 0.9 % SODIUM CHLORIDE (POUR BTL) OPTIME
TOPICAL | Status: DC | PRN
  Administered 2023-12-26: 1000 mL

## 2023-12-26 MED ORDER — MIDAZOLAM HCL (PF) 2 MG/2ML IJ SOLN
INTRAMUSCULAR | Status: DC | PRN
  Administered 2023-12-26: 2 mg via INTRAVENOUS

## 2023-12-26 MED ORDER — LIDOCAINE HCL (PF) 2 % IJ SOLN
INTRAMUSCULAR | Status: AC
  Filled 2023-12-26: qty 5

## 2023-12-26 MED ORDER — OXYCODONE HCL 5 MG PO TABS: 5.0000 mg | ORAL_TABLET | Freq: Once | ORAL | Status: DC | PRN

## 2023-12-26 MED ORDER — OXYCODONE HCL 5 MG/5ML PO SOLN: 5.0000 mg | Freq: Once | ORAL | Status: DC | PRN

## 2023-12-26 MED ORDER — FENTANYL CITRATE (PF) 100 MCG/2ML IJ SOLN: 25.0000 ug | INTRAMUSCULAR | Status: DC | PRN

## 2023-12-26 MED ORDER — BUPIVACAINE HCL 0.5 % IJ SOLN
INTRAMUSCULAR | Status: DC | PRN
  Administered 2023-12-26: 5 mL

## 2023-12-26 MED ORDER — PROPOFOL 10 MG/ML IV BOLUS
INTRAVENOUS | Status: DC | PRN
  Administered 2023-12-26: 120 ug/kg/min via INTRAVENOUS
  Administered 2023-12-26: 50 mg via INTRAVENOUS

## 2023-12-26 MED ORDER — FENTANYL CITRATE (PF) 100 MCG/2ML IJ SOLN
INTRAMUSCULAR | Status: AC
  Filled 2023-12-26: qty 2

## 2023-12-26 MED ORDER — FENTANYL CITRATE (PF) 100 MCG/2ML IJ SOLN
INTRAMUSCULAR | Status: DC | PRN
  Administered 2023-12-26: 50 ug via INTRAVENOUS

## 2023-12-26 MED ORDER — LIDOCAINE HCL (PF) 1 % IJ SOLN
INTRAMUSCULAR | Status: DC | PRN
  Administered 2023-12-26: 5 mL

## 2023-12-26 MED ORDER — VANCOMYCIN HCL IN DEXTROSE 1-5 GM/200ML-% IV SOLN
1000.0000 mg | Freq: Once | INTRAVENOUS | Status: AC
  Administered 2023-12-26: 1000 mg via INTRAVENOUS
  Filled 2023-12-26: qty 200

## 2023-12-26 MED ORDER — PIPERACILLIN-TAZOBACTAM 3.375 G IVPB
3.3750 g | Freq: Three times a day (TID) | INTRAVENOUS | Status: DC
  Administered 2023-12-26 – 2023-12-28 (×7): 3.375 g via INTRAVENOUS
  Filled 2023-12-26 (×7): qty 50

## 2023-12-26 MED ORDER — ROSUVASTATIN CALCIUM 10 MG PO TABS
10.0000 mg | ORAL_TABLET | Freq: Every day | ORAL | Status: DC
  Administered 2023-12-27 – 2023-12-28 (×2): 10 mg via ORAL
  Filled 2023-12-26 (×2): qty 1

## 2023-12-26 MED ORDER — VANCOMYCIN HCL 1500 MG/300ML IV SOLN
1500.0000 mg | Freq: Once | INTRAVENOUS | Status: AC
  Administered 2023-12-26: 1500 mg via INTRAVENOUS
  Filled 2023-12-26: qty 300

## 2023-12-26 MED ORDER — DROPERIDOL 2.5 MG/ML IJ SOLN: 0.6250 mg | Freq: Once | INTRAMUSCULAR | Status: DC | PRN

## 2023-12-26 MED ORDER — ACETAMINOPHEN 10 MG/ML IV SOLN: 1000.0000 mg | Freq: Once | INTRAVENOUS | Status: DC | PRN

## 2023-12-26 MED ORDER — PROPOFOL 10 MG/ML IV BOLUS
INTRAVENOUS | Status: AC
  Filled 2023-12-26: qty 20

## 2023-12-26 MED ORDER — SODIUM CHLORIDE 0.9 % IV SOLN: INTRAVENOUS | Status: DC | PRN

## 2023-12-26 SURGICAL SUPPLY — 2 items
NDL FILTER BLUNT 18X1 1/2 (NEEDLE) ×1 IMPLANT
NDL HYPO 25X1 1.5 SAFETY (NEEDLE) ×2 IMPLANT

## 2023-12-26 NOTE — Progress Notes (Signed)
 Pharmacy Antibiotic Note  [name] is a 58 y.o. male admitted on 12/25/2023 with osteomyelitis.  Patient reports worsening left foot pain and drainage for ~ 3 weeks. Patient was scheduled for surgical intervention on 01/17/24 but given worsening symptoms, podiatry recommending urgent intervention. Pharmacy has been consulted for vancomycin  and zosyn  dosing.  Plan: Give vancomycin  2500 mg IV x1 followed by 1500 mg IV Q12H. Goal AUC 400-550. Expected AUC: 472.5 Expected Css min: 13.2 SCr used: 1.04  Weight used: IBW, Vd used: 0.72 (BMI 31.19) Start Zosyn  3.375 g IV Q8H Continue to monitor renal function and follow culture results   Weight: 110.2 kg (242 lb 15.2 oz)  Temp (24hrs), Avg:99.3 F (37.4 C), Min:99 F (37.2 C), Max:99.6 F (37.6 C)  Recent Labs  Lab 12/26/23 0449  WBC 9.5  CREATININE 1.04    Estimated Creatinine Clearance: 103.5 mL/min (by C-G formula based on SCr of 1.04 mg/dL).    Allergies[1]  Antimicrobials this admission: 12/24 Vanc >>  12/24 Zosyn  >>   Microbiology results: None   Thank you for allowing pharmacy to be a part of this patients care.  Lum VEAR Mania, PharmD, BCPS 12/26/2023 11:08 AM     [1]  Allergies Allergen Reactions   Robinul [Glycopyrrolate]     Caused throat muscles to relax and pt couldn't swallow

## 2023-12-26 NOTE — Plan of Care (Signed)

## 2023-12-26 NOTE — Interval H&P Note (Signed)
 History and Physical Interval Note:  12/26/2023 2:11 PM  [name]  has presented today for surgery, with the diagnosis of left foot diabetic ulcer.  The various methods of treatment have been discussed with the patient and family. After consideration of risks, benefits and other options for treatment, the patient has consented to  Procedures: AMPUTATION, FOOT, RAY (Left) as a surgical intervention.  The patient's history has been reviewed, patient examined, no change in status, stable for surgery.  I have reviewed the patient's chart and labs.  Questions were answered to the patient's satisfaction.     Thresa CHRISTELLA Sar

## 2023-12-26 NOTE — Consult Note (Signed)
 "  PODIATRY CONSULTATION  NAME [name] MRN [id number] DOB 03/19/1965 DOA 12/25/2023   Reason for consult: No chief complaint on file.  History of present illness: 58 y.o. male admitted for worsening infection in left foot ulcer and concern for osteomyelitis.  Patient was seen in office yesterday, 12/25/2023 and orders were placed for direct admission to [facility] hospital for surgery.  Past Medical History:  Diagnosis Date   Broken ankle    Broken arm    Cataracts    Diabetes mellitus without complication (HCC)    type 2   Diabetic retinopathy (HCC)    PDR OU   History of Bell's palsy    Hyperlipidemia    Hypertension    Osteomyelitis (HCC)    foot   Sleep apnea    wears CPAP       Latest Ref Rng & Units 12/26/2023    4:49 AM 12/04/2019    9:17 AM 01/26/2019    1:40 PM  CBC  WBC 4.0 - 10.5 K/uL 9.5  9.4    Hemoglobin 13.0 - 17.0 g/dL 85.5  85.5  8.1   Hematocrit 39.0 - 52.0 % 42.3  45.3  24.5   Platelets 150 - 400 K/uL 284  282         Latest Ref Rng & Units 12/26/2023    4:49 AM 12/04/2019    9:17 AM 01/26/2019    5:36 AM  BMP  Glucose 70 - 99 mg/dL 851  834  842   BUN 6 - 20 mg/dL 21  23  20    Creatinine 0.61 - 1.24 mg/dL 8.95  8.91  8.96   Sodium 135 - 145 mmol/L 137  136  138   Potassium 3.5 - 5.1 mmol/L 4.4  4.0  3.8   Chloride 98 - 111 mmol/L 100  101  104   CO2 22 - 32 mmol/L 25  23  27    Calcium  8.9 - 10.3 mg/dL 8.5  8.9  7.8       LT foot 12/25/2023   Physical Exam: General: The patient is alert and oriented x3 in no acute distress.   Dermatology: Ulcer noted to the plantar aspect of the fifth MTP left foot which extends to bone.  Serous drainage with malodor noted.  Vascular: Palpable pedal pulses bilaterally. No edema or erythema noted. Capillary refill within normal limits.  Clinically no concern for vascular compromise  Neurological: Epicritic and protective threshold grossly intact bilaterally.   Musculoskeletal Exam: Patient  ambulatory.  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ERY  IMPRESSION: 1. Osteomyelitis of the fifth metatarsal head with overlying plantar soft tissue ulceration. 2. Small fifth MTP joint effusion with edema like signal at the base of the fifth proximal phalanx which may reflect reactive marrow changes or early osteomyelitis/septic arthritis. 3. No loculated fluid collection. 4. Chronic denervation changes of the intrinsic foot musculature.  ASSESSMENT/PLAN OF CARE Osteomyelitis with septic joint fifth MTP left  -Patient seen and evaluated in the preoperative holding area.  Discussed again today the findings of the MRI which are consistent with osteomyelitis and the necessity for partial fifth ray amputation.  Patient amenable to this plan.  Plan for partial fifth ray amputation with incision and drainage of the left foot -Preoperative orders placed -Podiatry will follow     Thank you for the consult.  Please contact me directly via secure chat with any questions or concerns.     Thresa EMERSON Sar, DPM [facility]  Dr. Thresa EMERSON Sar, DPM    [address] [place], [place] [address]                Office [phone]  Fax [phone]     "

## 2023-12-26 NOTE — Transfer of Care (Signed)
 Immediate Anesthesia Transfer of Care Note  Patient: [name]  Procedure(s) Performed: AMPUTATION, FOOT, RAY (Left)  Patient Location: PACU  Anesthesia Type:General  Level of Consciousness: oriented and drowsy  Airway & Oxygen Therapy: Patient Spontanous Breathing  Post-op Assessment: Report given to RN and Post -op Vital signs reviewed and stable  Post vital signs: Reviewed and stable  Last Vitals:  Vitals Value Taken Time  BP 122/57 12/26/23 16:05  Temp    Pulse 77 12/26/23 16:08  Resp 23 12/26/23 16:08  SpO2 96 % 12/26/23 16:08  Vitals shown include unfiled device data.  Last Pain:  Vitals:   12/26/23 1154  TempSrc:   PainSc: 0-No pain      Patients Stated Pain Goal: 0 (12/25/23 2248)  Complications: No notable events documented.

## 2023-12-26 NOTE — Op Note (Signed)
 "  OPERATIVE REPORT Patient name: [name] MRN: [id number] DOB: 03/22/1965  DOS: 12/26/2023  Preop Dx: Osteomyelitis left fifth metatarsal Postop Dx: same  Procedure:  1.  Partial fifth ray amputation left  Surgeon: Thresa EMERSON Sar DPM  Anesthesia: 50-50 mixture of 2% lidocaine  plain with 0.5% Marcaine  plain totaling 10 mL infiltrated in the patient's left foot  Hemostasis: Calf tourniquet inflated to a pressure of after esmarch exsanguination   EBL: Minimal mL Materials: None Injectables: None Pathology: Bone culture of the fifth metatarsal head.  Bone pathology of the most proximal portion of the fifth metatarsal diaphysis.  Soft tissue culture of the fifth MTP.  Condition: The patient tolerated the procedure and anesthesia well. No complications noted or reported   Justification for procedure: The patient is a 58 y.o. male who presents today for surgical correction of acute onset of worsening diabetic foot wound with underlying osteomyelitis. The patient was told benefits as well as possible side effects of the surgery. The patient consented for surgical correction. The patient consent form was reviewed. All patient questions were answered. No guarantees were expressed or implied. The patient and the surgeon both signed the patient consent form with the witness present and placed in the patient's chart.   Procedure in Detail: The patient was brought to the operating room, placed in the operating table in the supine position at which time an aseptic scrub and drape were performed about the patient's respective lower extremity after anesthesia was induced as described above. Attention was then directed to the surgical area where procedure number one commenced.  Procedure #1: Partial fifth ray amputation left foot A racquet type incision was planned and made around the fifth metatarsal of the left foot.  The incision was carried directly down to the level of the  metatarsal phalangeal joint at which time the toe was disarticulated and removed from the surgical field.  There was purulent drainage within the fifth MTP and cultures were taken and sent to pathology for C&S.  Soft tissue dissection of the distal half of the fifth metatarsal was also performed using a #15 scalpel to allow for osteotomy at the central portion of the fifth metatarsal diaphysis.  Osteotomy was created using a sagittal blade mounted on a sagittal saw and removed in toto.  The most proximal portion of the fifth metatarsal was harvested and sent for bone path.  The fifth metatarsal head which was very necrotic and somewhat friable was harvested and placed in a sterile specimen container and sent to pathology for bone culture.  Copious irrigation was then utilized and any remaining necrotic nonviable tissue within the amputation site was sharply debrided away using a #15 scalpel.  Excisional debridement of the plantar ulcer to the plantar aspect of the foot was also performed.  There was an extensive amount of necrotic nonviable tissue and surgical margins did not appear to be completely clear of infection even after the fifth ray amputation and debridement of the majority of the nonviable tissue.  Anticipate unclean margins  Stainless steel skin staples were then utilized to reapproximate the incision and half-inch iodoform was packed into the amputation site from the plantar foot wound.  Dry sterile compressive dressings were then applied to all previously mentioned incision sites about the patient's lower extremity. The tourniquet which was used for hemostasis was deflated. All normal neurovascular responses including pink color and warmth returned all the digits of patient's lower extremity.  The patient was then transferred from  the operating room to the recovery room having tolerated the procedure and anesthesia well. All vital signs are stable. After a brief stay in the recovery room the  patient was readmitted to inpatient room with postoperative orders placed.    IMPRESSION: Anticipate unclean margins.  Minimal WBAT in a cam boot mostly for transition purposes and bathroom privileges only.  Thresa EMERSON Sar, DPM [facility]  Dr. Thresa EMERSON Sar, DPM    [address] [place], [place] [address]                Office [phone]  Fax [phone]   "

## 2023-12-26 NOTE — Progress Notes (Addendum)
 OT Cancellation Note  Patient Details Name: [name] MRN: [id number] DOB: 1965/04/13   Cancelled Treatment:    Reason Eval/Treat Not Completed: Other (comment). Consult received, chart reviewed. Pt pending MRI and NPO for possible amputation. Will follow acutely and intervene as appropriate pending plan of care.   Addendum 11:27am: Per MD, hold therapy today. Will re-attempt on 12/26 as medically appropriate.   Randal Yepiz R., MPH, MS, OTR/L ascom [phone] 12/26/2023, 8:05 AM

## 2023-12-26 NOTE — Brief Op Note (Signed)
 12/26/2023  4:05 PM  PATIENT:  [name]  58 y.o. male  PRE-OPERATIVE DIAGNOSIS:  left foot diabetic ulcer  POST-OPERATIVE DIAGNOSIS:  left foot diabetic ulcer  PROCEDURE:  Procedures: AMPUTATION, FOOT, RAY (Left)  SURGEON:  Surgeons and Role:    DEWAINE [name] [name] CHRISTELLA, DPM - Primary  PHYSICIAN ASSISTANT: None  ASSISTANTS: none   ANESTHESIA:   local and IV sedation  EBL: Minimal  BLOOD ADMINISTERED:none  DRAINS: none   LOCAL MEDICATIONS USED:  MARCAINE    , LIDOCAINE  , and Amount: 10 ml  SPECIMEN:  Source of Specimen:  Both bone culture and bone path of the fifth metatarsal. Soft tissue culture left  DISPOSITION OF SPECIMEN:  PATHOLOGY  COUNTS:  YES  TOURNIQUET:   Total Tourniquet Time Documented: Calf (Left) - 24 minutes Total: Calf (Left) - 24 minutes   DICTATION: .Nechama Dictation  PLAN OF CARE: Admit to inpatient   PATIENT DISPOSITION:  PACU - hemodynamically stable.   Delay start of Pharmacological VTE agent (>24hrs) due to surgical blood loss or risk of bleeding: no  [name], DPM [facility]  Dr. Thresa EMERSON [name], DPM    [address] [place], [place] [address]                Office [phone]  Fax [phone]

## 2023-12-26 NOTE — Progress Notes (Signed)
 " PROGRESS NOTE    [name]  FMW:[id number] DOB: 1965/08/15 DOA: 12/25/2023 PCP: Janey Santos, MD  Outpatient Specialists: podiatry    Brief Narrative:   From admission h and p  [name] is a 58 y.o. year old male with medical history of hypertension, hyperlipidemia, type 2 diabetes, history of diabetic foot infections currently admitted on podiatry's recommendation for potential amputation.   Patient states he has been having worsening left foot pain and drainage since about 3 weeks.  He has been in close follow-up with podiatry who initially were planning for surgical intervention on 01/17/2023 but given today's evaluation they recommend urgent intervention.  Patient denies any fevers or chills.  He denies any URI symptoms.  Assessment & Plan:   Principal Problem:   Acute osteomyelitis (HCC) Active Problems:   Type II diabetes mellitus (HCC)   Hyperlipidemia   Hypertension   Obesity, Class I, BMI 30-34.9   OSA on CPAP   # Osteomyelitis, left 5th MTP Mri results pending. Podiatry planning surgery today - surgical mgmt per podiatry - check blood cultures - start vanc/zosyn  - will need therapy evals tomorrow after surgery  # T2DM Good control A1c 6.6%. euglycemic this morning - ssi for now, is on tresiba 100 units daily at home, novolog  22/18/26, also metformin , mounjaro, jardiance at home  # HTN Normotensive - home olmesartan /amlodipine /hydrochlorothiazide  on hold  # obesity Noted  # OSA - cpap qhs     DVT prophylaxis: SCDs Code Status: full Family Communication: wife at bedside 12/24  Level of care: Med-Surg Status is: Inpatient Remains inpatient appropriate because: need for inpatient surgery, iv abx    Consultants:  podiatry  Procedures: pending  Antimicrobials:  Starting vanc/zosyn     Subjective: Stable left foot pain  Objective: Vitals:   12/25/23 2057 12/26/23 0403 12/26/23 0500 12/26/23 0812  BP: (!) 131/53  (!) 111/59  129/62  Pulse: 80 80  75  Resp: 18 17  18   Temp: 99 F (37.2 C) 99.3 F (37.4 C)  99.6 F (37.6 C)  TempSrc: Oral   Oral  SpO2: 96% 96%  97%  Weight:   110.2 kg     Intake/Output Summary (Last 24 hours) at 12/26/2023 1103 Last data filed at 12/25/2023 2248 Gross per 24 hour  Intake 603 ml  Output --  Net 603 ml   Filed Weights   12/26/23 0500  Weight: 110.2 kg    Examination:  General exam: Appears calm and comfortable  Respiratory system: Clear to auscultation. Respiratory effort normal. Cardiovascular system: S1 & S2 heard, RRR. No JVD, murmurs, rubs, gallops or clicks. No pedal edema. Gastrointestinal system: Abdomen is nondistended, soft and nontender. No organomegaly or masses felt. Normal bowel sounds heard. Central nervous system: Alert and oriented. No focal neurological deficits. Extremities: erythema distal lateral aspect of left foot. Plantar ulcer under 5th mtp with foul smelling discharge Skin: see above Psychiatry: Judgement and insight appear normal. Mood & affect appropriate.     Data Reviewed: I have personally reviewed following labs and imaging studies  CBC: Recent Labs  Lab 12/26/23 0449  WBC 9.5  HGB 14.4  HCT 42.3  MCV 91.6  PLT 284   Basic Metabolic Panel: Recent Labs  Lab 12/25/23 1742 12/26/23 0449  NA  --  137  K  --  4.4  CL  --  100  CO2  --  25  GLUCOSE  --  148*  BUN  --  21*  CREATININE  --  1.04  CALCIUM   --  8.5*  MG 2.7*  --    GFR: Estimated Creatinine Clearance: 103.5 mL/min (by C-G formula based on SCr of 1.04 mg/dL). Liver Function Tests: No results for input(s): AST, ALT, ALKPHOS, BILITOT, PROT, ALBUMIN in the last 168 hours. No results for input(s): LIPASE, AMYLASE in the last 168 hours. No results for input(s): AMMONIA in the last 168 hours. Coagulation Profile: No results for input(s): INR, PROTIME in the last 168 hours. Cardiac Enzymes: No results for input(s):  CKTOTAL, CKMB, CKMBINDEX, TROPONINI in the last 168 hours. BNP (last 3 results) No results for input(s): PROBNP in the last 8760 hours. HbA1C: Recent Labs    12/25/23 1742  HGBA1C 6.6*   CBG: Recent Labs  Lab 12/25/23 1745 12/25/23 2129 12/26/23 0813  GLUCAP 146* 162* 140*   Lipid Profile: No results for input(s): CHOL, HDL, LDLCALC, TRIG, CHOLHDL, LDLDIRECT in the last 72 hours. Thyroid Function Tests: No results for input(s): TSH, T4TOTAL, FREET4, T3FREE, THYROIDAB in the last 72 hours. Anemia Panel: No results for input(s): VITAMINB12, FOLATE, FERRITIN, TIBC, IRON, RETICCTPCT in the last 72 hours. Urine analysis:    Component Value Date/Time   COLORURINE YELLOW 03/10/2015 1408   APPEARANCEUR CLEAR 03/10/2015 1408   LABSPEC 1.027 03/10/2015 1408   PHURINE 5.0 03/10/2015 1408   GLUCOSEU >1000 (A) 03/10/2015 1408   HGBUR NEGATIVE 03/10/2015 1408   BILIRUBINUR NEGATIVE 03/10/2015 1408   KETONESUR 40 (A) 03/10/2015 1408   PROTEINUR NEGATIVE 03/10/2015 1408   NITRITE NEGATIVE 03/10/2015 1408   LEUKOCYTESUR NEGATIVE 03/10/2015 1408   Sepsis Labs: @LABRCNTIP (procalcitonin:4,lacticidven:4)  )No results found for this or any previous visit (from the past 240 hours).       Radiology Studies: DG Foot Complete Left Result Date: 12/25/2023 Please see detailed radiograph report in office note.       Scheduled Meds:  insulin  aspart  0-15 Units Subcutaneous TID WC   insulin  aspart  0-5 Units Subcutaneous QHS   rosuvastatin   10 mg Oral Daily   sodium chloride  flush  3 mL Intravenous Q12H   Continuous Infusions:   LOS: 1 day     Devaughn KATHEE Ban, MD [facility]   If 7PM-7AM, please contact night-coverage [email or web address] Password [email or web address] 12/26/2023, 11:03 AM     "

## 2023-12-26 NOTE — Anesthesia Preprocedure Evaluation (Addendum)
 "                                  Anesthesia Evaluation  Patient identified by MRN, date of birth, ID band Patient awake    Reviewed: Allergy & Precautions, H&P , NPO status , Patient's Chart, lab work & pertinent test results  Airway Mallampati: II  TM Distance: >3 FB Neck ROM: full    Dental no notable dental hx.    Pulmonary sleep apnea and Continuous Positive Airway Pressure Ventilation    Pulmonary exam normal        Cardiovascular hypertension, Normal cardiovascular exam     Neuro/Psych negative neurological ROS  negative psych ROS   GI/Hepatic negative GI ROS, Neg liver ROS,,,  Endo/Other  diabetes, Type 2    Renal/GU negative Renal ROS  negative genitourinary   Musculoskeletal   Abdominal Normal abdominal exam  (+)   Peds  Hematology negative hematology ROS (+)   Anesthesia Other Findings Past Medical History: No date: Broken ankle No date: Broken arm No date: Cataracts No date: Diabetes mellitus without complication (HCC)     Comment:  type 2 No date: Diabetic retinopathy (HCC)     Comment:  PDR OU No date: History of Bell's palsy No date: Hyperlipidemia No date: Hypertension No date: Osteomyelitis (HCC)     Comment:  foot No date: Sleep apnea     Comment:  wears CPAP  Past Surgical History: 03/04/2015: AMPUTATION; Right     Comment:  Procedure: PARTIAL AMPUTATION RIGHT 5TH METATARSAL;                Surgeon: Jeb Broach, DPM;  Location: [facility] OR;  Service:              Podiatry;  Laterality: Right; No date: APPENDECTOMY 11/17/2005: COLONOSCOPY     Comment:  TAs - [place] 01/25/2019: COLONOSCOPY; N/A     Comment:  Procedure: COLONOSCOPY;  Surgeon: Kristie Lamprey, MD;                Location: [facility] ENDOSCOPY;  Service: Endoscopy;  Laterality:              N/A; No date: EYE SURGERY; Bilateral     Comment:  blood vessels -cautery 01/25/2019: HEMOSTASIS CLIP PLACEMENT     Comment:  Procedure: HEMOSTASIS CLIP PLACEMENT;  Surgeon:  Kristie Lamprey, MD;  Location: [facility] ENDOSCOPY;  Service:               Endoscopy;; 03/04/2015: I & D EXTREMITY; Right     Comment:  Procedure: IRRIGATION AND DEBRIDEMENT RIGHT FOOT;                Surgeon: Jeb Broach, DPM;  Location: [facility] OR;  Service:              Podiatry;  Laterality: Right; No date: WISDOM TOOTH EXTRACTION     Comment:  only 2 ext  BMI    Body Mass Index: 31.19 kg/m      Reproductive/Obstetrics negative OB ROS                              Anesthesia Physical Anesthesia Plan  ASA: 2  Anesthesia Plan: General   Post-op Pain Management: Regional block*   Induction: Intravenous  PONV  Risk Score and Plan: Propofol  infusion and TIVA  Airway Management Planned: Natural Airway  Additional Equipment:   Intra-op Plan:   Post-operative Plan:   Informed Consent: I have reviewed the patients History and Physical, chart, labs and discussed the procedure including the risks, benefits and alternatives for the proposed anesthesia with the patient or authorized representative who has indicated his/her understanding and acceptance.     Dental Advisory Given  Plan Discussed with: CRNA and Surgeon  Anesthesia Plan Comments: (Back-up LMA)         Anesthesia Quick Evaluation  "

## 2023-12-27 ENCOUNTER — Encounter: Payer: Self-pay | Admitting: Podiatry

## 2023-12-27 DIAGNOSIS — M86172 Other acute osteomyelitis, left ankle and foot: Secondary | ICD-10-CM | POA: Diagnosis not present

## 2023-12-27 DIAGNOSIS — M8610 Other acute osteomyelitis, unspecified site: Secondary | ICD-10-CM | POA: Diagnosis not present

## 2023-12-27 DIAGNOSIS — E11621 Type 2 diabetes mellitus with foot ulcer: Secondary | ICD-10-CM | POA: Diagnosis not present

## 2023-12-27 DIAGNOSIS — L97529 Non-pressure chronic ulcer of other part of left foot with unspecified severity: Secondary | ICD-10-CM | POA: Diagnosis not present

## 2023-12-27 DIAGNOSIS — E785 Hyperlipidemia, unspecified: Secondary | ICD-10-CM | POA: Diagnosis not present

## 2023-12-27 LAB — GLUCOSE, CAPILLARY
Glucose-Capillary: 158 mg/dL — ABNORMAL HIGH (ref 70–99)
Glucose-Capillary: 172 mg/dL — ABNORMAL HIGH (ref 70–99)
Glucose-Capillary: 208 mg/dL — ABNORMAL HIGH (ref 70–99)
Glucose-Capillary: 170 mg/dL — ABNORMAL HIGH (ref 70–99)

## 2023-12-27 MED ORDER — ENOXAPARIN SODIUM 60 MG/0.6ML IJ SOSY
55.0000 mg | PREFILLED_SYRINGE | Freq: Every day | INTRAMUSCULAR | Status: DC
  Administered 2023-12-27: 55 mg via SUBCUTANEOUS
  Filled 2023-12-27: qty 0.6

## 2023-12-27 MED ORDER — DULOXETINE HCL 30 MG PO CPEP
30.0000 mg | ORAL_CAPSULE | Freq: Every day | ORAL | Status: DC
  Administered 2023-12-28: 30 mg via ORAL
  Filled 2023-12-27: qty 1

## 2023-12-27 MED ORDER — LATANOPROST 0.005 % OP SOLN
1.0000 [drp] | Freq: Every day | OPHTHALMIC | Status: DC
  Administered 2023-12-27: 1 [drp] via OPHTHALMIC
  Filled 2023-12-27: qty 2.5

## 2023-12-27 NOTE — Progress Notes (Signed)
 PHARMACIST - PHYSICIAN COMMUNICATION  CONCERNING:  Enoxaparin  (Lovenox ) for DVT Prophylaxis    RECOMMENDATION: Patient was prescribed enoxaprin 40mg  q24 hours for VTE prophylaxis.   Filed Weights   12/26/23 0500  Weight: 110.2 kg (242 lb 15.2 oz)    Body mass index is 31.19 kg/m.  Estimated Creatinine Clearance: 103.5 mL/min (by C-G formula based on SCr of 1.04 mg/dL).   Based on [facility] policy patient is candidate for enoxaparin  0.5mg /kg TBW SQ every 24 hours based on BMI being >30.   DESCRIPTION: Pharmacy has adjusted enoxaparin  dose per [facility] policy.  Patient is now receiving enoxaparin  55 mg every 24 hours    Annabella LOISE Banks, PharmD Clinical Pharmacist  12/27/2023 4:34 PM

## 2023-12-27 NOTE — Evaluation (Signed)
 Physical Therapy Evaluation Patient Details Name: [name] MRN: [id number] DOB: 03-21-1965 Today's Date: 12/27/2023  History of Present Illness  presented to ER secondary to worsening L foot pain, drainage; admitted for management of acute osteomyelitis, s/p L partial 5th ray amputation (12/25)  Clinical Impression  Patient alert and oriented, follows commands and agreeable to participation with treatment session.  Endorses some standing at bedside since admission, but minimal mobility efforts to date. L post-op dressing intact; pain grossly 2-4/10 per FACES scale; slight increase with WBing and mobility, but tolerable per patient report.  Bilat LE strength and ROM grossly symmetrical and WFL for basic transfers and mobility; baseline neuropathy knees distally bilat LEs (unchanged with this admission). Able to complete bed mobility indep; sit/stand, basic transfers and gait (30') without assist device, cga/close sup.  Demonstrates partially reciprocal stepping pattern, fair WBing/stance time L LE. Occasional posterior sway/LOB with turns or obstacle negotiation (due to CAM boot and changed weight shift), does improve throughout distance; patient able to self-correct as needed, but does occasionally reach for walls/furniture as needed to stabilize.  Do recommend R footwear with all gait efforts to optimize balance and overall safety; patient voiced understanding.  May benefit from trial of [facility] vs knee scooter next session? Reviewed MD recommendations for WBing [facility]), use of CAM boot (with all WBing), technique for donning/doffing CAM boot and overall distance/activity limitations (transfers, bathroom privileges only); encouraged for step-to gait pattern to optimize balance and limited WBing LLE. Patient voiced understanding and agreement. Would benefit from skilled PT to address above deficits and promote optimal return to PLOF.; recommend post-acute PT follow up as indicated by  interdisciplinary care team.         If plan is discharge home, recommend the following: A little help with walking and/or transfers;A little help with bathing/dressing/bathroom   Can travel by private vehicle        Equipment Recommendations    Recommendations for Other Services       Functional Status Assessment Patient has had a recent decline in their functional status and demonstrates the ability to make significant improvements in function in a reasonable and predictable amount of time.     Precautions / Restrictions Precautions Precautions: Fall Precaution/Restrictions Comments: WBAT with CAM boot; transfers and bathroom privileges only Restrictions Weight Bearing Restrictions Per Provider Order: Yes Other Position/Activity Restrictions: WBAT with CAM boot; transfers and bathroom privileges only      Mobility  Bed Mobility Overal bed mobility: Independent                  Transfers Overall transfer level: Needs assistance Equipment used: None Transfers: Sit to/from Stand Sit to Stand: Supervision                Ambulation/Gait Ambulation/Gait assistance: Supervision Gait Distance (Feet): 30 Feet Assistive device: None         General Gait Details: partially reciprocal stepping pattern, fair WBing/stance time L LE.  Occasional posterior sway/LOB with turns or obstacle negotiation (due to CAM boot and changed weight shift), does improve throughout distance; patient able to self-correct as needed, but does occasionally reach for walls/furniture as needed to stabilize.  Stairs            Wheelchair Mobility     Tilt Bed    Modified Rankin (Stroke Patients Only)       Balance Overall balance assessment: Needs assistance Sitting-balance support: No upper extremity supported, Feet supported Sitting balance-Leahy Scale: Good  Standing balance support: No upper extremity supported Standing balance-Leahy Scale: Fair                                Pertinent Vitals/Pain Pain Assessment Pain Assessment: Faces Faces Pain Scale: Hurts a little bit Pain Location: L foot Pain Descriptors / Indicators: Aching Pain Intervention(s): Limited activity within patient's tolerance, Monitored during session, Repositioned    Home Living Family/patient expects to be discharged to:: Private residence Living Arrangements: Spouse/significant other Available Help at Discharge: Family Type of Home: House Home Access: Stairs to enter   Secretary/administrator of Steps: 1   Home Layout: One level Home Equipment: None      Prior Function Prior Level of Function : Independent/Modified Independent;Working/employed;Driving                     Extremity/Trunk Assessment   Upper Extremity Assessment Upper Extremity Assessment: Overall WFL for tasks assessed    Lower Extremity Assessment Lower Extremity Assessment: Overall WFL for tasks assessed;LLE deficits/detail LLE Deficits / Details: L post-op dressing intact; CAM boot donned for all WBing activities       Communication   Communication Communication: No apparent difficulties    Cognition Arousal: Alert Behavior During Therapy: WFL for tasks assessed/performed   PT - Cognitive impairments: No apparent impairments                         Following commands: Intact       Cueing Cueing Techniques: Verbal cues     General Comments      Exercises Other Exercises Other Exercises: Reviewed MD recommendations for WBing (WBAT), use of CAM boot (with all WBing), technique for donning/doffing CAM boot and overall distance/activity limitations (transfers, bathroom privileges only). Patient voiced understanding and agreement.   Assessment/Plan    PT Assessment Patient needs continued PT services  PT Problem List Decreased activity tolerance;Decreased balance;Decreased mobility       PT Treatment Interventions DME instruction;Gait  training;Stair training;Functional mobility training;Therapeutic activities;Therapeutic exercise;Balance training;Patient/family education    PT Goals (Current goals can be found in the Care Plan section)  Acute Rehab PT Goals Patient Stated Goal: for this to get better PT Goal Formulation: With patient Time For Goal Achievement: 01/10/24 Potential to Achieve Goals: Good Additional Goals Additional Goal #1: Indep don/doff CAM boot, maintain activity restrictions for optimal protection and healing of L foot.    Frequency Min 3X/week     Co-evaluation               AM-PAC PT 6 Clicks Mobility  Outcome Measure Help needed turning from your back to your side while in a flat bed without using bedrails?: None Help needed moving from lying on your back to sitting on the side of a flat bed without using bedrails?: None Help needed moving to and from a bed to a chair (including a wheelchair)?: None Help needed standing up from a chair using your arms (e.g., wheelchair or bedside chair)?: A Little Help needed to walk in hospital room?: A Little Help needed climbing 3-5 steps with a railing? : A Little 6 Click Score: 21    End of Session   Activity Tolerance: Patient tolerated treatment well Patient left: in chair;with call bell/phone within reach (Fall risk 8; alarm not required) Nurse Communication: Mobility status PT Visit Diagnosis: Difficulty in walking, not elsewhere classified (R26.2)  Time: 8847-8787 PT Time Calculation (min) (ACUTE ONLY): 20 min   Charges:   PT Evaluation $PT Eval Low Complexity: 1 Low   PT General Charges $$ ACUTE PT VISIT: 1 Visit         Madi Bonfiglio H. Delores, PT, DPT, NCS 12/27/2023, 12:47 PM [phone]

## 2023-12-27 NOTE — Progress Notes (Signed)
 " PROGRESS NOTE    [name]  FMW:[id number] DOB: 07-05-1965 DOA: 12/25/2023 PCP: Janey Santos, MD  Outpatient Specialists: podiatry    Brief Narrative:   From admission h and p  [name] is a 58 y.o. year old male with medical history of hypertension, hyperlipidemia, type 2 diabetes, history of diabetic foot infections currently admitted on podiatry's recommendation for potential amputation.   Patient states he has been having worsening left foot pain and drainage since about 3 weeks.  He has been in close follow-up with podiatry who initially were planning for surgical intervention on 01/17/2023 but given today's evaluation they recommend urgent intervention.  Patient denies any fevers or chills.  He denies any URI symptoms.  Assessment & Plan:   Principal Problem:   Acute osteomyelitis (HCC) Active Problems:   Type II diabetes mellitus (HCC)   Hyperlipidemia   Hypertension   Obesity, Class I, BMI 30-34.9   OSA on CPAP   # Osteomyelitis, left 5th MTP S/p partial 5th ray amputation 12/24. Likely unclean margins - surgical mgmt per podiatry - follow cultures - cont vanc/zosyn  - no PT needs per PT  # T2DM Good control A1c 6.6%. euglycemia here - ssi for now, is on tresiba 100 units daily at home, novolog  22/18/26, also metformin , mounjaro, jardiance at home  # HTN Normotensive to mild bp elevation - home olmesartan /amlodipine /hydrochlorothiazide  on hold  # obesity Noted  # OSA - cpap qhs   DVT prophylaxis: lovenox  Code Status: full Family Communication: wife at bedside 12/25  Level of care: Med-Surg Status is: Inpatient Remains inpatient appropriate because: need for inpatient surgery, iv abx    Consultants:  podiatry  Procedures: Left 5th toe partial ray amputation 12/24  Antimicrobials:  vanc/zosyn     Subjective: Stable left foot pain  Objective: Vitals:   12/26/23 2107 12/27/23 0030 12/27/23 0429 12/27/23 0736  BP:  (!) 142/63  137/61 (!) 150/62  Pulse: 76  87 88  Resp: 18  18 16   Temp: 100.3 F (37.9 C) 99 F (37.2 C) 98.8 F (37.1 C) 99.1 F (37.3 C)  TempSrc:  Oral Oral   SpO2: 99%  96% 94%  Weight:        Intake/Output Summary (Last 24 hours) at 12/27/2023 1623 Last data filed at 12/27/2023 1531 Gross per 24 hour  Intake 1523.26 ml  Output 2475 ml  Net -951.74 ml   Filed Weights   12/26/23 0500  Weight: 110.2 kg    Examination:  General exam: Appears calm and comfortable  Respiratory system: normal wob Cardiovascular system: normal perfusion Gastrointestinal system: Abdomen is nondistended  Central nervous system: Alert and oriented. No focal neurological deficits. Extremities: left foot wrapped and in boot Skin: no visible lesions Psychiatry: Judgement and insight appear normal. Mood & affect appropriate.     Data Reviewed: I have personally reviewed following labs and imaging studies  CBC: Recent Labs  Lab 12/26/23 0449  WBC 9.5  HGB 14.4  HCT 42.3  MCV 91.6  PLT 284   Basic Metabolic Panel: Recent Labs  Lab 12/25/23 1742 12/26/23 0449  NA  --  137  K  --  4.4  CL  --  100  CO2  --  25  GLUCOSE  --  148*  BUN  --  21*  CREATININE  --  1.04  CALCIUM   --  8.5*  MG 2.7*  --    GFR: Estimated Creatinine Clearance: 103.5 mL/min (by C-G formula based on SCr  of 1.04 mg/dL). Liver Function Tests: No results for input(s): AST, ALT, ALKPHOS, BILITOT, PROT, ALBUMIN in the last 168 hours. No results for input(s): LIPASE, AMYLASE in the last 168 hours. No results for input(s): AMMONIA in the last 168 hours. Coagulation Profile: No results for input(s): INR, PROTIME in the last 168 hours. Cardiac Enzymes: No results for input(s): CKTOTAL, CKMB, CKMBINDEX, TROPONINI in the last 168 hours. BNP (last 3 results) No results for input(s): PROBNP in the last 8760 hours. HbA1C: Recent Labs    12/25/23 1742  HGBA1C 6.6*    CBG: Recent Labs  Lab 12/26/23 1111 12/26/23 1844 12/26/23 2155 12/27/23 0735 12/27/23 1132  GLUCAP 137* 144* 136* 158* 172*   Lipid Profile: No results for input(s): CHOL, HDL, LDLCALC, TRIG, CHOLHDL, LDLDIRECT in the last 72 hours. Thyroid Function Tests: No results for input(s): TSH, T4TOTAL, FREET4, T3FREE, THYROIDAB in the last 72 hours. Anemia Panel: No results for input(s): VITAMINB12, FOLATE, FERRITIN, TIBC, IRON, RETICCTPCT in the last 72 hours. Urine analysis:    Component Value Date/Time   COLORURINE YELLOW 03/10/2015 1408   APPEARANCEUR CLEAR 03/10/2015 1408   LABSPEC 1.027 03/10/2015 1408   PHURINE 5.0 03/10/2015 1408   GLUCOSEU >1000 (A) 03/10/2015 1408   HGBUR NEGATIVE 03/10/2015 1408   BILIRUBINUR NEGATIVE 03/10/2015 1408   KETONESUR 40 (A) 03/10/2015 1408   PROTEINUR NEGATIVE 03/10/2015 1408   NITRITE NEGATIVE 03/10/2015 1408   LEUKOCYTESUR NEGATIVE 03/10/2015 1408   Sepsis Labs: @LABRCNTIP (procalcitonin:4,lacticidven:4)  ) Recent Results (from the past 240 hours)  Culture, blood (Routine X 2) w Reflex to ID Panel     Status: None (Preliminary result)   Collection Time: 12/26/23 11:40 AM   Specimen: BLOOD  Result Value Ref Range Status   Specimen Description BLOOD BLOOD LEFT ARM  Final   Special Requests   Final    BOTTLES DRAWN AEROBIC AND ANAEROBIC Blood Culture adequate volume   Culture   Final    NO GROWTH < 24 HOURS Performed at [facility], [address], [place], [place] [address]    Report Status PENDING  Incomplete  Culture, blood (Routine X 2) w Reflex to ID Panel     Status: None (Preliminary result)   Collection Time: 12/26/23 11:40 AM   Specimen: BLOOD  Result Value Ref Range Status   Specimen Description BLOOD BLOOD RIGHT ARM  Final   Special Requests   Final    BOTTLES DRAWN AEROBIC AND ANAEROBIC Blood Culture adequate volume   Culture   Final    NO GROWTH < 24 HOURS Performed at  [facility], [address] [place], [place] [address]    Report Status PENDING  Incomplete  Aerobic/Anaerobic Culture w Gram Stain (surgical/deep wound)     Status: None (Preliminary result)   Collection Time: 12/26/23  3:33 PM   Specimen: Wound; Tissue  Result Value Ref Range Status   Specimen Description BONE  Final   Special Requests LEFT 5TH METATARSAL  SAMPLE B  Final   Gram Stain MODERATE GRAM POSITIVE COCCI NO WBC SEEN   Final   Culture   Final    CULTURE REINCUBATED FOR BETTER GROWTH Performed at [facility], [address] [place], [place] [address]    Report Status PENDING  Incomplete  Aerobic/Anaerobic Culture w Gram Stain (surgical/deep wound)     Status: None (Preliminary result)   Collection Time: 12/26/23  3:35 PM   Specimen: Wound  Result Value Ref Range Status  Specimen Description   Final    WOUND Performed at [facility], [address], [place], [place] [address]    Special Requests   Final    NONE Performed at [facility], [address] [place], [place] [address]    Gram Stain NO WBC SEEN RARE GRAM POSITIVE COCCI IN PAIRS   Final   Culture   Final    TOO YOUNG TO READ Performed at [facility], [address], [place], [place] [address]    Report Status PENDING  Incomplete  Aerobic/Anaerobic Culture w Gram Stain (surgical/deep wound)     Status: None (Preliminary result)   Collection Time: 12/26/23  3:35 PM   Specimen: Wound; Tissue  Result Value Ref Range Status   Specimen Description BONE  Final   Special Requests LEFT 5TH METATARSAL SAMPLE C  Final   Gram Stain NO WBC SEEN NO ORGANISMS SEEN   Final   Culture   Final    NO GROWTH < 12 HOURS Performed at [facility], [address] [place], [place] [address]    Report Status PENDING  Incomplete         Radiology Studies: DG Foot Complete Left Result Date: 12/26/2023 CLINICAL DATA:  Postop left foot. EXAM: LEFT FOOT - COMPLETE  3+ VIEW COMPARISON:  Radiograph yesterday FINDINGS: Transmetatarsal amputation of the fifth ray. A dressing is seen in the overlying soft tissue operative bed with skin staples in place. Hammertoe deformity of the toes. Forefoot soft tissue edema. Vascular calcifications are seen. IMPRESSION: Transmetatarsal amputation of the fifth ray. Electronically Signed   By: Andrea Gasman M.D.   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 Increased swelling and redness. Concern for osteomyelitis. EXAM: MRI OF THE LEFT FOOT WITHOUT CONTRAST TECHNIQUE: Multiplanar, multisequence MR imaging of the left foot was performed. No intravenous contrast was administered. COMPARISON:  Left foot radiographs dated 12/25/2023. FINDINGS: Soft tissue/Bones/Joint/Cartilage Plantar soft tissue ulceration overlying the fifth metatarsal head. T2 hyperintense marrow signal abnormality with confluent T1 hypointensity of the fifth metatarsal head is compatible with osteomyelitis. T2 hyperintense signal at the base of the fifth proximal phalanx may reflect reactive marrow changes or early osteomyelitis/septic arthritis. Small fifth MTP joint effusion. No convincing marrow signal abnormality identified elsewhere to suggest osteomyelitis. 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 No joint effusion. No acute fracture or dislocation. Ligaments Lisfranc ligament is intact. Muscles and Tendons Chronic denervation changes of the intrinsic foot musculature with diffuse fatty atrophy. No significant tenosynovitis. Soft tissue No loculated fluid collection. Subcutaneous edema extending along the dorsal foot. IMPRESSION: 1. Osteomyelitis of the fifth metatarsal head with overlying plantar soft tissue ulceration. 2. Small fifth MTP joint effusion with edema like signal at the base of the fifth proximal phalanx which may  reflect reactive marrow changes or early osteomyelitis/septic arthritis. 3. No loculated fluid collection. 4. Chronic denervation changes of the intrinsic foot musculature. Electronically Signed   By: Harrietta Sherry M.D.   On: 12/26/2023 12:39        Scheduled Meds:  insulin  aspart  0-15 Units Subcutaneous TID WC   insulin  aspart  0-5 Units Subcutaneous QHS   rosuvastatin   10 mg Oral Daily   sodium chloride  flush  3 mL Intravenous Q12H   Continuous Infusions:  piperacillin -tazobactam (ZOSYN )  IV 3.375 g (12/27/23 1348)  vancomycin  1,500 mg (12/27/23 1531)     LOS: 2 days     Devaughn KATHEE Ban, MD [facility]   If 7PM-7AM, please contact night-coverage [email or web address] Password [facility] 12/27/2023, 4:23 PM     "

## 2023-12-27 NOTE — Progress Notes (Addendum)
" ° °  Subjective: 1 Day Post-Op Procedures (LRB): AMPUTATION, FOOT, RAY (Left) DOS: 12/27/2023  Objective: Vital signs in last 24 hours: Temp:  [97.4 F (36.3 C)-100.3 F (37.9 C)] 99.1 F (37.3 C) (12/25 0736) Pulse Rate:  [75-88] 88 (12/25 0736) Resp:  [16-20] 16 (12/25 0736) BP: (122-150)/(57-63) 150/62 (12/25 0736) SpO2:  [94 %-99 %] 94 % (12/25 0736)  Recent Labs    12/26/23 0449  HGB 14.4   Recent Labs    12/26/23 0449  WBC 9.5  RBC 4.62  HCT 42.3  PLT 284   Recent Labs    12/26/23 0449  NA 137  K 4.4  CL 100  CO2 25  BUN 21*  CREATININE 1.04  GLUCOSE 148*  CALCIUM  8.5*   No results for input(s): LABPT, INR in the last 72 hours.  Physical Exam: Foot is stable.  No active bleeding noted with dressing changes.  No heavy purulence noted.  There continues to be some persistent edema and erythema which is mostly localized to the midfoot and forefoot  Specimen Description BONE  Special Requests LEFT 5TH METATARSAL SAMPLE C  Gram Stain NO WBC SEEN NO ORGANISMS SEEN  Culture NO GROWTH < 12 HOURS Performed at [facility], [address] [place], [place] [address]  Report Status PENDING    Assessment/Plan: 1 Day Post-Op Procedures (LRB): AMPUTATION, FOOT, RAY (Left) DOS: 12/26/2023  -Dressings changed.  Packing was removed and reapplied to the plantar foot wound. -Wound care orders placed: Pack plantar foot wound with half-inch iodoform, dressed incision and plantar foot wound with nonadherent Xeroform, 4 x 4 gauze, Kerlix, Ace wrap -Continue minimal WBAT in a cam boot for transition purposes and bathroom privileges only -Cultures pending.  Unfortunately he did receive IV Zosyn  approximately 4 hours prior to surgery -Continue IV antibiotics -Due to the severity of the osteomyelitis and concerned that the margins were not completely clean I do recommend continued inpatient status to ensure appropriate antibiotics and possible ID consult pending  culture results -Will follow   Thresa CHRISTELLA Sar 12/27/2023, 1:49 PM  Thresa EMERSON Sar, DPM [facility]  Dr. Thresa EMERSON Sar, DPM    [address] [place], [place] [address]                Office [phone]  Fax [phone]    "

## 2023-12-27 NOTE — Plan of Care (Signed)

## 2023-12-27 NOTE — Consult Note (Signed)
 WOC team consulted for dressing change every other day to L foot post partial ray amputation 5th digit performed by Dr. Janit 12/24.    Dressing change performed by Dr. Janit on Friday 12/25.  WOC team will not be on campus at time of next change 12/27.  Will follow-up with podiatry for further instructions.    Podiatry desires dressing change every other day as follows: Remove current dressing, using a Q tip applicator pack plantar foot wound with 1/2  iodoform gauze Soila [phone]), paint incision site and periwound w/ betadine,  dress incision and plantar foot wound with nonadherent Xeroform 4 x 4 gauze Soila #[phone]) Kerlix, Ace wrap   Thank you,    Jandel Patriarca MSN, RN-BC, CWOCN

## 2023-12-27 NOTE — Anesthesia Postprocedure Evaluation (Signed)
"   Anesthesia Post Note  Patient: [name]  Procedure(s) Performed: AMPUTATION, FOOT, RAY (Left)  Patient location during evaluation: PACU Anesthesia Type: General Level of consciousness: awake and alert Pain management: pain level controlled Vital Signs Assessment: post-procedure vital signs reviewed and stable Respiratory status: spontaneous breathing, nonlabored ventilation and respiratory function stable Cardiovascular status: blood pressure returned to baseline and stable Postop Assessment: no apparent nausea or vomiting Anesthetic complications: no   No notable events documented.   Last Vitals:  Vitals:   12/27/23 0030 12/27/23 0429  BP:  137/61  Pulse:  87  Resp:  18  Temp: 37.2 C 37.1 C  SpO2:  96%    Last Pain:  Vitals:   12/27/23 0429  TempSrc: Oral  PainSc:                  Fairy POUR Hoyte Ziebell      "

## 2023-12-28 ENCOUNTER — Other Ambulatory Visit: Payer: Self-pay

## 2023-12-28 DIAGNOSIS — B954 Other streptococcus as the cause of diseases classified elsewhere: Secondary | ICD-10-CM

## 2023-12-28 DIAGNOSIS — E11628 Type 2 diabetes mellitus with other skin complications: Secondary | ICD-10-CM

## 2023-12-28 DIAGNOSIS — M86172 Other acute osteomyelitis, left ankle and foot: Secondary | ICD-10-CM | POA: Diagnosis not present

## 2023-12-28 DIAGNOSIS — Z794 Long term (current) use of insulin: Secondary | ICD-10-CM

## 2023-12-28 DIAGNOSIS — L97529 Non-pressure chronic ulcer of other part of left foot with unspecified severity: Secondary | ICD-10-CM

## 2023-12-28 DIAGNOSIS — E785 Hyperlipidemia, unspecified: Secondary | ICD-10-CM | POA: Diagnosis not present

## 2023-12-28 DIAGNOSIS — E11621 Type 2 diabetes mellitus with foot ulcer: Secondary | ICD-10-CM | POA: Diagnosis not present

## 2023-12-28 DIAGNOSIS — M8610 Other acute osteomyelitis, unspecified site: Secondary | ICD-10-CM | POA: Diagnosis not present

## 2023-12-28 LAB — CBC
HCT: 41.7 % (ref 39.0–52.0)
Hemoglobin: 14.1 g/dL (ref 13.0–17.0)
MCH: 30.9 pg (ref 26.0–34.0)
MCHC: 33.8 g/dL (ref 30.0–36.0)
MCV: 91.2 fL (ref 80.0–100.0)
Platelets: 289 K/uL (ref 150–400)
RBC: 4.57 MIL/uL (ref 4.22–5.81)
RDW: 12.6 % (ref 11.5–15.5)
WBC: 11.4 K/uL — ABNORMAL HIGH (ref 4.0–10.5)
nRBC: 0 % (ref 0.0–0.2)

## 2023-12-28 LAB — GLUCOSE, CAPILLARY
Glucose-Capillary: 139 mg/dL — ABNORMAL HIGH (ref 70–99)
Glucose-Capillary: 149 mg/dL — ABNORMAL HIGH (ref 70–99)

## 2023-12-28 MED ORDER — OXYCODONE HCL 5 MG PO TABS
5.0000 mg | ORAL_TABLET | Freq: Four times a day (QID) | ORAL | 0 refills | Status: DC | PRN
  Filled 2023-12-28: qty 15, 4d supply, fill #0

## 2023-12-28 MED ORDER — AMOXICILLIN 500 MG PO CAPS
1000.0000 mg | ORAL_CAPSULE | Freq: Two times a day (BID) | ORAL | 0 refills | Status: AC
  Filled 2023-12-28: qty 56, 14d supply, fill #0

## 2023-12-28 MED ORDER — AMOXICILLIN-POT CLAVULANATE 875-125 MG PO TABS
1.0000 | ORAL_TABLET | Freq: Two times a day (BID) | ORAL | 0 refills | Status: DC
  Filled 2023-12-28: qty 14, 7d supply, fill #0

## 2023-12-28 NOTE — Discharge Summary (Signed)
 [name] FMW:[id number] DOB: 1965-08-03 DOA: 12/25/2023  PCP: Avva, Ravisankar, MD  Admit date: 12/25/2023 Discharge date: 12/28/2023  Time spent: 35 minutes  Recommendations for Outpatient Follow-up:  Podiatry f/u 1 week  Pcp f/u    Discharge Diagnoses:  Principal Problem:   Acute osteomyelitis (HCC) Active Problems:   Type II diabetes mellitus (HCC)   Hyperlipidemia   Hypertension   Obesity, Class I, BMI 30-34.9   OSA on CPAP   Discharge Condition: stable  Diet recommendation: carb modified  Filed Weights   12/26/23 0500 12/28/23 0500  Weight: 110.2 kg 110.5 kg    History of present illness:  From admission h and p [name] is a 58 y.o. year old male with medical history of hypertension, hyperlipidemia, type 2 diabetes, history of diabetic foot infections currently admitted on podiatry's recommendation for potential amputation.   Patient states he has been having worsening left foot pain and drainage since about 3 weeks.  He has been in close follow-up with podiatry who initially were planning for surgical intervention on 01/17/2023 but given today's evaluation they recommend urgent intervention.  Patient denies any fevers or chills.  He denies any URI symptoms.  Hospital Course:   # Osteomyelitis, left 5th MTP S/p partial 5th ray amputation 12/24. Likely unclean margins. Op cultures growing group G strep. Stable for d/c per podiatry, wound care instructions given. ID consulted, advises 2 weeks abx. No home health needs per PT/OT. - 2 weeks abx as below - monitor cultures - podiatry f/u 1 week   # T2DM Good control A1c 6.6%. euglycemia here   # HTN Normotensive here - home olmesartan /amlodipine /hydrochlorothiazide  on hold, advise close pcp f/u    Procedures: 12/24: Partial fifth ray amputation left    Consultations: Podiatry, ID  Discharge Exam: Vitals:   12/28/23 0742 12/28/23 1407  BP: 125/62 137/64  Pulse: 66 70  Resp:  18   Temp: 98.3 F (36.8 C) 98.9 F (37.2 C)  SpO2: 96% 98%    General: NAD Cardiovascular: rrr Respiratory: ctab Ext:      Discharge Instructions   Discharge Instructions     Discharge wound care:   Complete by: As directed    Wound care orders placed: Pack plantar foot wound with half-inch iodoform, dressed incision and plantar foot wound with nonadherent Xeroform, 4 x 4 gauze, Kerlix, Ace wrap   Increase activity slowly   Complete by: As directed       Allergies as of 12/28/2023       Reactions   Robinul [glycopyrrolate]    Caused throat muscles to relax and pt couldn't swallow        Medication List     PAUSE taking these medications    Olmesartan -amLODIPine -HCTZ 40-10-25 MG Tabs Wait to take this until your doctor or other care provider tells you to start again. Take 1 tablet by mouth daily. Reported on 02/04/2015       STOP taking these medications    ibuprofen 200 MG tablet Commonly known as: ADVIL   oxyCODONE -acetaminophen  5-325 MG tablet Commonly known as: Percocet   Tresiba FlexTouch 200 UNIT/ML FlexTouch Pen Generic drug: insulin  degludec       TAKE these medications    amoxicillin  500 MG capsule Commonly known as: AMOXIL  Take 2 capsules (1,000 mg total) by mouth 2 (two) times daily for 14 days.   amoxicillin -clavulanate 875-125 MG tablet Commonly known as: AUGMENTIN  Take 1 tablet by mouth 2 (two) times daily.   DULoxetine   30 MG capsule Commonly known as: CYMBALTA  Take 30 mg by mouth daily.   fluticasone 50 MCG/ACT nasal spray Commonly known as: FLONASE Place 2 sprays into both nostrils daily as needed for allergies or rhinitis.   FreeStyle Libre 14 Day Sensor Misc SMARTSIG:1 Topical Every 2 Weeks   HumaLOG KwikPen 200 UNIT/ML KwikPen Generic drug: insulin  lispro SMARTSIG:15 Unit(s) SUB-Q 3 Times Daily   Jardiance 25 MG Tabs tablet Generic drug: empagliflozin Take 25 mg by mouth daily.   latanoprost  0.005 % ophthalmic  solution Commonly known as: XALATAN  Place 1 drop into both eyes at bedtime.   Lyumjev KwikPen 100 UNIT/ML KwikPen Generic drug: Insulin  Lispro-aabc Lyumjev Rx for Novolog  is 22 units prior to breakfast, 18 units with lunch, 26 units with dinner Subcutaneous as directed; Duration: 30 days   metFORMIN  1000 MG tablet Commonly known as: GLUCOPHAGE  Take 1,000 mg by mouth 2 (two) times daily.   Mounjaro 12.5 MG/0.5ML Pen Generic drug: tirzepatide Inject 12.5 mg into the skin once a week.   multivitamin tablet Take 1 tablet by mouth daily.   omega-3 acid ethyl esters 1 g capsule Commonly known as: LOVAZA  Take 1 capsule by mouth 2 (two) times daily.   oxyCODONE  5 MG immediate release tablet Commonly known as: Oxy IR/ROXICODONE  Take 1 tablet (5 mg total) by mouth every 6 (six) hours as needed for moderate pain (pain score 4-6).   rosuvastatin  10 MG tablet Commonly known as: CRESTOR  Take 10 mg by mouth daily.   sildenafil 100 MG tablet Commonly known as: VIAGRA One by mouth daily when necessary   silver  sulfADIAZINE  1 % cream Commonly known as: Silvadene  Apply 1 Application topically daily.   terbinafine  250 MG tablet Commonly known as: LAMISIL  Take 1 tablet (250 mg total) by mouth daily.               Discharge Care Instructions  (From admission, onward)           Start     Ordered   12/28/23 0000  Discharge wound care:       Comments: Wound care orders placed: Pack plantar foot wound with half-inch iodoform, dressed incision and plantar foot wound with nonadherent Xeroform, 4 x 4 gauze, Kerlix, Ace wrap   12/28/23 1440           Allergies[1]  Contact information for follow-up providers     Janit Thresa HERO, DPM Follow up.   Specialty: Podiatry Why: 1 week Contact information: [address] [place] [address] [phone]         Janey Santos, MD Follow up.   Specialty: Internal Medicine Contact information: [address] [place] [address] [phone]              Contact information for after-discharge care     Home Medical Care     [facility]) .   Service: Home Health Services Contact information: [address] [place]  [phone]                      The results of significant diagnostics from this hospitalization (including imaging, microbiology, ancillary and laboratory) are listed below for reference.    Significant Diagnostic Studies: DG Foot Complete Left Result Date: 12/26/2023 CLINICAL DATA:  Postop left foot. EXAM: LEFT FOOT - COMPLETE 3+ VIEW COMPARISON:  Radiograph yesterday FINDINGS: Transmetatarsal amputation of the fifth ray. A dressing is seen in the overlying soft tissue  operative bed with skin staples in place. Hammertoe deformity of the toes. Forefoot soft tissue edema. Vascular calcifications are seen. IMPRESSION: Transmetatarsal amputation of the fifth ray. Electronically Signed   By: Andrea Gasman M.D.   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 Increased swelling and redness. Concern for osteomyelitis. EXAM: MRI OF THE LEFT FOOT WITHOUT CONTRAST TECHNIQUE: Multiplanar, multisequence MR imaging of the left foot was performed. No intravenous contrast was administered. COMPARISON:  Left foot radiographs dated 12/25/2023. FINDINGS: Soft tissue/Bones/Joint/Cartilage Plantar soft tissue ulceration overlying the fifth metatarsal head. T2 hyperintense marrow signal abnormality with confluent T1 hypointensity of the fifth metatarsal head is compatible with osteomyelitis. T2 hyperintense signal at the base of the fifth proximal phalanx may reflect reactive marrow changes or early osteomyelitis/septic arthritis. Small fifth MTP joint effusion. No convincing marrow signal abnormality identified  elsewhere to suggest osteomyelitis. 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 No joint effusion. No acute fracture or dislocation. Ligaments Lisfranc ligament is intact. Muscles and Tendons Chronic denervation changes of the intrinsic foot musculature with diffuse fatty atrophy. No significant tenosynovitis. Soft tissue No loculated fluid collection. Subcutaneous edema extending along the dorsal foot. IMPRESSION: 1. Osteomyelitis of the fifth metatarsal head with overlying plantar soft tissue ulceration. 2. Small fifth MTP joint effusion with edema like signal at the base of the fifth proximal phalanx which may reflect reactive marrow changes or early osteomyelitis/septic arthritis. 3. No loculated fluid collection. 4. Chronic denervation changes of the intrinsic foot musculature. Electronically Signed   By: Harrietta Sherry M.D.   On: 12/26/2023 12:39   DG Foot Complete Left Result Date: 12/25/2023 Please see detailed radiograph report in office note.   Microbiology: Recent Results (from the past 240 hours)  Culture, blood (Routine X 2) w Reflex to ID Panel     Status: None (Preliminary result)   Collection Time: 12/26/23 11:40 AM   Specimen: BLOOD  Result Value Ref Range Status   Specimen Description BLOOD BLOOD LEFT ARM  Final   Special Requests   Final    BOTTLES DRAWN AEROBIC AND ANAEROBIC Blood Culture adequate volume   Culture   Final    NO GROWTH 2 DAYS Performed at [facility], [address], [place], [place] [address]    Report Status PENDING  Incomplete  Culture, blood (Routine X 2) w Reflex to ID Panel     Status: None (Preliminary result)   Collection Time: 12/26/23 11:40 AM   Specimen: BLOOD  Result Value Ref Range Status   Specimen Description BLOOD BLOOD RIGHT ARM  Final   Special Requests   Final    BOTTLES DRAWN AEROBIC AND ANAEROBIC Blood Culture adequate volume   Culture   Final    NO GROWTH 2 DAYS Performed at [facility], [address] [place], [place] [address]    Report Status PENDING  Incomplete  Aerobic/Anaerobic Culture w Gram Stain (surgical/deep wound)     Status: None (Preliminary result)   Collection Time: 12/26/23  3:33 PM   Specimen: Wound; Tissue  Result Value Ref Range Status   Specimen Description BONE  Final   Special Requests LEFT 5TH METATARSAL  SAMPLE B  Final   Gram Stain MODERATE GRAM POSITIVE COCCI NO WBC SEEN   Final   Culture   Final    RARE ENTEROCOCCUS FAECALIS ABUNDANT STREPTOCOCCUS  GROUP G Beta hemolytic streptococci are predictably susceptible to penicillin and other beta lactams. Susceptibility testing not routinely performed. SUSCEPTIBILITIES TO FOLLOW HOLDING FOR POSSIBLE ANAEROBE Performed at [facility], [address] [place], [place] [address]    Report Status PENDING  Incomplete  Aerobic/Anaerobic Culture w Gram Stain (surgical/deep wound)     Status: None (Preliminary result)   Collection Time: 12/26/23  3:35 PM   Specimen: Wound  Result Value Ref Range Status   Specimen Description   Final    WOUND Performed at [facility], [address], [place], [place] [address]    Special Requests   Final    NONE Performed at [facility], [address] [place], [place] [address]    Gram Stain NO WBC SEEN RARE GRAM POSITIVE COCCI IN PAIRS   Final   Culture   Final    MODERATE STREPTOCOCCUS GROUP G Beta hemolytic streptococci are predictably susceptible to penicillin and other beta lactams. Susceptibility testing not routinely performed. CULTURE REINCUBATED FOR BETTER GROWTH HOLDING FOR POSSIBLE ANAEROBE Performed at [facility], [address] [place], [place] [address]    Report Status PENDING  Incomplete  Aerobic/Anaerobic Culture w Gram Stain (surgical/deep wound)     Status: None (Preliminary result)   Collection Time: 12/26/23  3:35 PM   Specimen: Wound; Tissue  Result Value Ref Range Status   Specimen Description BONE   Final   Special Requests LEFT 5TH METATARSAL SAMPLE C  Final   Gram Stain NO WBC SEEN NO ORGANISMS SEEN   Final   Culture   Final    MODERATE STREPTOCOCCUS GROUP G Beta hemolytic streptococci are predictably susceptible to penicillin and other beta lactams. Susceptibility testing not routinely performed. CULTURE REINCUBATED FOR BETTER GROWTH HOLDING FOR POSSIBLE ANAEROBE Performed at [facility], [address], [place], [place] [address]    Report Status PENDING  Incomplete     Labs: Basic Metabolic Panel: Recent Labs  Lab 12/25/23 1742 12/26/23 0449  NA  --  137  K  --  4.4  CL  --  100  CO2  --  25  GLUCOSE  --  148*  BUN  --  21*  CREATININE  --  1.04  CALCIUM   --  8.5*  MG 2.7*  --    Liver Function Tests: No results for input(s): AST, ALT, ALKPHOS, BILITOT, PROT, ALBUMIN in the last 168 hours. No results for input(s): LIPASE, AMYLASE in the last 168 hours. No results for input(s): AMMONIA in the last 168 hours. CBC: Recent Labs  Lab 12/26/23 0449 12/28/23 0551  WBC 9.5 11.4*  HGB 14.4 14.1  HCT 42.3 41.7  MCV 91.6 91.2  PLT 284 289   Cardiac Enzymes: No results for input(s): CKTOTAL, CKMB, CKMBINDEX, TROPONINI in the last 168 hours. BNP: BNP (last 3 results) No results for input(s): BNP in the last 8760 hours.  ProBNP (last 3 results) No results for input(s): PROBNP in the last 8760 hours.  CBG: Recent Labs  Lab 12/27/23 1132 12/27/23 1649 12/27/23 2132 12/28/23 0740 12/28/23 1151  GLUCAP 172* 208* 170* 139* 149*       Signed:  Devaughn KATHEE Ban MD.  [facility] 12/28/2023, 2:45 PM     [1]  Allergies Allergen Reactions   Robinul [Glycopyrrolate]     Caused throat muscles to relax and pt couldn't swallow

## 2023-12-28 NOTE — Consult Note (Signed)
 NAME: [name]  DOB: 04/19/1965  MRN: [id number]  Date/Time: 12/28/2023 11:44 AM  REQUESTING PROVIDER: Dr.Wouk Subjective:  REASON FOR CONSULT: left foot infection ? [name] is a 58 y.o. with a history of HTN, DM.DFI H/o toe amputations rt foot, presented with left foot 5th MTP area ulcer with redness, drainage for 3 weeks was admitted to the floor by podiatrist for surgery No recent antibiotic use Was taken for surgery and underwent 5 th toe partial ray amputation on 12/26/23. Culture positive for streptococcus Group G Bone pathology pending I am asked to see him for antibiotic management No fever or chills    Past Medical History:  Diagnosis Date   Broken ankle    Broken arm    Cataracts    Diabetes mellitus without complication (HCC)    type 2   Diabetic retinopathy (HCC)    PDR OU   History of Bell's palsy    Hyperlipidemia    Hypertension    Osteomyelitis (HCC)    foot   Sleep apnea    wears CPAP    Past Surgical History:  Procedure Laterality Date   AMPUTATION Right 03/04/2015   Procedure: PARTIAL AMPUTATION RIGHT 5TH METATARSAL;  Surgeon: Jeb Broach, DPM;  Location: [facility] OR;  Service: Podiatry;  Laterality: Right;   AMPUTATION Left 12/26/2023   Procedure: AMPUTATION, FOOT, RAY;  Surgeon: Janit Thresa HERO, DPM;  Location: [facility] ORS;  Service: Orthopedics/Podiatry;  Laterality: Left;   APPENDECTOMY     COLONOSCOPY  11/17/2005   TAs - [place]   COLONOSCOPY N/A 01/25/2019   Procedure: COLONOSCOPY;  Surgeon: Kristie Lamprey, MD;  Location: [facility] ENDOSCOPY;  Service: Endoscopy;  Laterality: N/A;   EYE SURGERY Bilateral    blood vessels -cautery   HEMOSTASIS CLIP PLACEMENT  01/25/2019   Procedure: HEMOSTASIS CLIP PLACEMENT;  Surgeon: Kristie Lamprey, MD;  Location: [facility] ENDOSCOPY;  Service: Endoscopy;;   I & D EXTREMITY Right 03/04/2015   Procedure: IRRIGATION AND DEBRIDEMENT RIGHT FOOT;  Surgeon: Jeb Broach, DPM;  Location: [facility] OR;  Service: Podiatry;   Laterality: Right;   WISDOM TOOTH EXTRACTION     only 2 ext    Social History   Socioeconomic History   Marital status: Married    Spouse name: tonia   Number of children: 3   Years of education: Not on file   Highest education level: 12th grade  Occupational History   Not on file  Tobacco Use   Smoking status: Never   Smokeless tobacco: Never  Vaping Use   Vaping status: Never Used  Substance and Sexual Activity   Alcohol use: No   Drug use: No   Sexual activity: Not on file  Other Topics Concern   Not on file  Social History Narrative   Lives wih wife and 1 adopted daughter   Right handed    Caffeine: 1 cup of coffee a day   Social Drivers of Health   Tobacco Use: Low Risk (12/25/2023)   Patient History    Smoking Tobacco Use: Never    Smokeless Tobacco Use: Never    Passive Exposure: Not on file  Financial Resource Strain: Not on file  Food Insecurity: No Food Insecurity (12/25/2023)   Epic    Worried About Programme Researcher, Broadcasting/film/video in the Last Year: Never true    Ran Out of Food in the Last Year: Never true  Transportation Needs: No Transportation Needs (12/25/2023)   Epic    Lack of Transportation (Medical):  No    Lack of Transportation (Non-Medical): No  Physical Activity: Not on file  Stress: Not on file  Social Connections: Not on file  Intimate Partner Violence: Not At Risk (12/25/2023)   Epic    Fear of Current or Ex-Partner: No    Emotionally Abused: No    Physically Abused: No    Sexually Abused: No  Depression (PHQ2-9): Not on file  Alcohol Screen: Not on file  Housing: Low Risk (12/25/2023)   Epic    Unable to Pay for Housing in the Last Year: No    Number of Times Moved in the Last Year: 0    Homeless in the Last Year: No  Utilities: Not At Risk (12/25/2023)   Epic    Threatened with loss of utilities: No  Health Literacy: Not on file    Family History  Problem Relation Age of Onset   Diabetes Father    Colon cancer Father    Colon  polyps Father    Esophageal cancer Neg Hx    Rectal cancer Neg Hx    Stomach cancer Neg Hx    Sleep apnea Neg Hx    Allergies[1] I? Current Facility-Administered Medications  Medication Dose Route Frequency Provider Last Rate Last Admin   acetaminophen  (TYLENOL ) tablet 650 mg  650 mg Oral Q6H PRN Fernand Prost, MD       Or   acetaminophen  (TYLENOL ) suppository 650 mg  650 mg Rectal Q6H PRN Fernand Prost, MD       DULoxetine  (CYMBALTA ) DR capsule 30 mg  30 mg Oral Daily Wouk, Devaughn Sayres, MD   30 mg at 12/28/23 0900   enoxaparin  (LOVENOX ) injection 55 mg  55 mg Subcutaneous QHS Kandis Devaughn Sayres, MD   55 mg at 12/27/23 2249   HYDROmorphone  (DILAUDID ) injection 0.5-1 mg  0.5-1 mg Intravenous Q2H PRN Khan, Ghalib, MD   1 mg at 12/27/23 0045   insulin  aspart (novoLOG ) injection 0-15 Units  0-15 Units Subcutaneous TID [facility] Khan, Ghalib, MD   2 Units at 12/28/23 0800   insulin  aspart (novoLOG ) injection 0-5 Units  0-5 Units Subcutaneous QHS Fernand Prost, MD       latanoprost  (XALATAN ) 0.005 % ophthalmic solution 1 drop  1 drop Both Eyes QHS Wouk, Devaughn Sayres, MD   1 drop at 12/27/23 2249   ondansetron  (ZOFRAN ) tablet 4 mg  4 mg Oral Q6H PRN Fernand Prost, MD       Or   ondansetron  (ZOFRAN ) injection 4 mg  4 mg Intravenous Q6H PRN Fernand Prost, MD       oxyCODONE  (Oxy IR/ROXICODONE ) immediate release tablet 5 mg  5 mg Oral Q4H PRN Khan, Ghalib, MD   5 mg at 12/28/23 0111   piperacillin -tazobactam (ZOSYN ) IVPB 3.375 g  3.375 g Intravenous Q8H Hunt, Madison H, RPH 12.5 mL/hr at 12/28/23 0555 3.375 g at 12/28/23 0555   rosuvastatin  (CRESTOR ) tablet 10 mg  10 mg Oral Daily Khan, Ghalib, MD   10 mg at 12/28/23 0900   senna-docusate (Senokot-S) tablet 1 tablet  1 tablet Oral QHS PRN Fernand Prost, MD       sodium chloride  flush (NS) 0.9 % injection 3 mL  3 mL Intravenous Q12H Khan, Ghalib, MD   3 mL at 12/28/23 0900   vancomycin  (VANCOREADY) IVPB 1500 mg/300 mL  1,500 mg Intravenous Q12H Perley Lum DEL,  [place]   Stopped at 12/28/23 0309     Abtx:  Anti-infectives (From admission, onward)  Start     Dose/Rate Route Frequency Ordered Stop   12/27/23 0000  vancomycin  (VANCOREADY) IVPB 1500 mg/300 mL        1,500 mg 150 mL/hr over 120 Minutes Intravenous Every 12 hours 12/26/23 1114     12/26/23 1200  vancomycin  (VANCOREADY) IVPB 1500 mg/300 mL       Placed in And Linked Group   1,500 mg 150 mL/hr over 120 Minutes Intravenous  Once 12/26/23 1114 12/26/23 1523   12/26/23 1200  vancomycin  (VANCOCIN ) IVPB 1000 mg/200 mL premix       Placed in And Linked Group   1,000 mg 200 mL/hr over 60 Minutes Intravenous  Once 12/26/23 1114 12/26/23 1821   12/26/23 1200  piperacillin -tazobactam (ZOSYN ) IVPB 3.375 g        3.375 g 12.5 mL/hr over 240 Minutes Intravenous Every 8 hours 12/26/23 1114         REVIEW OF SYSTEMS:  Const: negative fever, negative chills, negative weight loss Eyes: negative diplopia or visual changes, negative eye pain ENT: negative coryza, negative sore throat Resp: negative cough, hemoptysis, dyspnea Cards: negative for chest pain, palpitations, lower extremity edema GU: negative for frequency, dysuria and hematuria GI: Negative for abdominal pain, diarrhea, bleeding, constipation Skin: negative for rash and pruritus Heme: negative for easy bruising and gum/nose bleeding MS: negative for myalgias, arthralgias, back pain and muscle weakness Neurolo:negative for headaches, dizziness, vertigo, memory problems  Psych: negative for feelings of anxiety, depression  Endocrine:  diabetes Allergy/Immunology- as above Objective:  VITALS:  BP 125/62 (BP Location: Left Arm)   Pulse 66   Temp 98.3 F (36.8 C)   Resp 16   Ht 6' 2 (1.88 m)   Wt 110.5 kg   SpO2 96%   BMI 31.28 kg/m   PHYSICAL EXAM:  General: Alert, cooperative, no distress, appears stated age.  Head: Normocephalic, without obvious abnormality, atraumatic. Eyes: Conjunctivae clear, anicteric sclerae.  Pupils are equal ENT Nares normal. No drainage or sinus tenderness. Lips, mucosa, and tongue normal. No Thrush Neck: Supple, symmetrical, no adenopathy, thyroid: non tender no carotid bruit and no JVD. Back: No CVA tenderness. Lungs: Clear to auscultation bilaterally. No Wheezing or Rhonchi. No rales. Heart: Regular rate and rhythm, no murmur, rub or gallop. Abdomen: Soft, non-tender,not distended. Bowel sounds normal. No masses Extremities: left foot- 5th toe amputation site clean- no erythema Ulcer on the lateral aspect clean     Rt foot 5th toe absent Skin: No rashes or lesions. Or bruising Lymph: Cervical, supraclavicular normal. Neurologic: Grossly non-focal Pertinent Labs Lab Results CBC    Component Value Date/Time   WBC 11.4 (H) 12/28/2023 0551   RBC 4.57 12/28/2023 0551   HGB 14.1 12/28/2023 0551   HCT 41.7 12/28/2023 0551   PLT 289 12/28/2023 0551   MCV 91.2 12/28/2023 0551   MCH 30.9 12/28/2023 0551   MCHC 33.8 12/28/2023 0551   RDW 12.6 12/28/2023 0551   LYMPHSABS 2.2 12/04/2019 0917   MONOABS 0.6 12/04/2019 0917   EOSABS 0.1 12/04/2019 0917   BASOSABS 0.1 12/04/2019 0917       Latest Ref Rng & Units 12/26/2023    4:49 AM 12/04/2019    9:17 AM 01/26/2019    5:36 AM  CMP  Glucose 70 - 99 mg/dL 851  834  842   BUN 6 - 20 mg/dL 21  23  20    Creatinine 0.61 - 1.24 mg/dL 8.95  8.91  8.96   Sodium 135 - 145 mmol/L  137  136  138   Potassium 3.5 - 5.1 mmol/L 4.4  4.0  3.8   Chloride 98 - 111 mmol/L 100  101  104   CO2 22 - 32 mmol/L 25  23  27    Calcium  8.9 - 10.3 mg/dL 8.5  8.9  7.8   Total Protein 6.5 - 8.1 g/dL  7.0    Total Bilirubin 0.3 - 1.2 mg/dL  0.5    Alkaline Phos 38 - 126 U/L  43    AST 15 - 41 U/L  18    ALT 0 - 44 U/L  19        Microbiology: Recent Results (from the past 240 hours)  Culture, blood (Routine X 2) w Reflex to ID Panel     Status: None (Preliminary result)   Collection Time: 12/26/23 11:40 AM   Specimen: BLOOD  Result  Value Ref Range Status   Specimen Description BLOOD BLOOD LEFT ARM  Final   Special Requests   Final    BOTTLES DRAWN AEROBIC AND ANAEROBIC Blood Culture adequate volume   Culture   Final    NO GROWTH 2 DAYS Performed at [facility], [address], [place], [place] [address]    Report Status PENDING  Incomplete  Culture, blood (Routine X 2) w Reflex to ID Panel     Status: None (Preliminary result)   Collection Time: 12/26/23 11:40 AM   Specimen: BLOOD  Result Value Ref Range Status   Specimen Description BLOOD BLOOD RIGHT ARM  Final   Special Requests   Final    BOTTLES DRAWN AEROBIC AND ANAEROBIC Blood Culture adequate volume   Culture   Final    NO GROWTH 2 DAYS Performed at [facility], [address] [place], [place] [address]    Report Status PENDING  Incomplete  Aerobic/Anaerobic Culture w Gram Stain (surgical/deep wound)     Status: None (Preliminary result)   Collection Time: 12/26/23  3:33 PM   Specimen: Wound; Tissue  Result Value Ref Range Status   Specimen Description BONE  Final   Special Requests LEFT 5TH METATARSAL  SAMPLE B  Final   Gram Stain MODERATE GRAM POSITIVE COCCI NO WBC SEEN   Final   Culture   Final    RARE ENTEROCOCCUS FAECALIS ABUNDANT STREPTOCOCCUS GROUP G Beta hemolytic streptococci are predictably susceptible to penicillin and other beta lactams. Susceptibility testing not routinely performed. STAPHYLOCOCCUS AUREUS SUSCEPTIBILITIES TO FOLLOW Performed at [facility], [address], [place], [place] [address]    Report Status PENDING  Incomplete  Aerobic/Anaerobic Culture w Gram Stain (surgical/deep wound)     Status: None (Preliminary result)   Collection Time: 12/26/23  3:35 PM   Specimen: Wound  Result Value Ref Range Status   Specimen Description   Final    WOUND Performed at [facility], [address], [place], [place] [address]    Special Requests   Final    NONE Performed at [facility], [address] [place], [place] [address]    Gram Stain NO WBC SEEN RARE GRAM POSITIVE COCCI IN PAIRS   Final   Culture   Final    MODERATE STREPTOCOCCUS GROUP G Beta hemolytic streptococci are predictably susceptible to penicillin and other beta lactams. Susceptibility testing not routinely performed. CULTURE REINCUBATED FOR BETTER GROWTH Performed at [facility], [address], [place], [place] [address]    Report Status PENDING  Incomplete  Aerobic/Anaerobic Culture w Gram Stain (surgical/deep wound)     Status: None (Preliminary result)   Collection Time: 12/26/23  3:35 PM   Specimen: Wound; Tissue  Result Value Ref Range Status   Specimen Description BONE  Final   Special Requests LEFT 5TH METATARSAL SAMPLE C  Final   Gram Stain NO WBC SEEN NO ORGANISMS SEEN   Final   Culture   Final    MODERATE STREPTOCOCCUS GROUP G Beta hemolytic streptococci are predictably susceptible to penicillin and other beta lactams. Susceptibility testing not routinely performed. CULTURE REINCUBATED FOR BETTER GROWTH Performed at [facility], [address], [place], [place] [address]    Report Status PENDING  Incomplete     IMAGING RESULTS: MRI left foot Osteomyelitis of the fifth metatarsal head with overlying plantar soft tissue ulceration. I have personally reviewed the films ? Impression/Recommendation Diabetic foot infection with ulcer/osteomyelitis of the 5 th toe of the left foot- s/p partial ray amputation Streptococcus Group G  Pt currently on vanco and zosyn  Will switch to Augmentin  XR 2 grams BID ( this can be given as Amoxicillin  1 gram BID and Augmentin  875mg  BID) for 2 weeks Will follow pathology and decide whetehr to extend antibiotic   DM pt takes, insulin , mounjaro, metfromin at home  Discussed the management with the patient Discussed side effects of the antibiotic and to watch for GI symptoms May do labs in 1-2 weeks- check CMP ? ?  ________________________________________________ Discussed with requesting provider P will follow with Dr.Evans as OP       [1]  Allergies Allergen Reactions   Robinul [Glycopyrrolate]     Caused throat muscles to relax and pt couldn't swallow

## 2023-12-28 NOTE — Progress Notes (Signed)
 Physical Therapy Treatment Patient Details Name: [name] MRN: [id number] DOB: 12-04-1965 Today's Date: 12/28/2023   History of Present Illness presented to ER secondary to worsening L foot pain, drainage; admitted for management of acute osteomyelitis, s/p L partial 5th ray amputation (12/25)    PT Comments  Pt received reclined in bed ready for d/c this afternoon. Reviewed WB precautions. Trialed knee scooter today as pt has access to you. Educated pt on safe use and benefits for protecting surgical site for distances further than bathroom privileges. Pt demos safe completion of all transfers and mobility either at independent or supervision level with safe and correct use of knee scooter. Pt returns to bed with all needs in reach with no further questions.    If plan is discharge home, recommend the following: A little help with walking and/or transfers;A little help with bathing/dressing/bathroom   Can travel by private vehicle        Equipment Recommendations       Recommendations for Other Services       Precautions / Restrictions Precautions Precautions: Fall Recall of Precautions/Restrictions: Intact Precaution/Restrictions Comments: WBAT with CAM boot; transfers and bathroom privileges only Restrictions Weight Bearing Restrictions Per Provider Order: Yes Other Position/Activity Restrictions: WBAT with CAM boot; transfers and bathroom privileges only     Mobility  Bed Mobility Overal bed mobility: Independent               Patient Response: Cooperative  Transfers Overall transfer level: Needs assistance Equipment used: None Transfers: Sit to/from Stand Sit to Stand: Independent                Ambulation/Gait Ambulation/Gait assistance: Supervision Gait Distance (Feet): 60 Feet Assistive device: Knee scooter         General Gait Details: maintains NWB on knee scooter   Stairs             Wheelchair Mobility     Tilt  Bed Tilt Bed Patient Response: Cooperative  Modified Rankin (Stroke Patients Only)       Balance Overall balance assessment: Needs assistance Sitting-balance support: No upper extremity supported, Feet supported Sitting balance-Leahy Scale: Normal     Standing balance support: No upper extremity supported Standing balance-Leahy Scale: Fair                              Hotel Manager: No apparent difficulties  Cognition Arousal: Alert Behavior During Therapy: WFL for tasks assessed/performed   PT - Cognitive impairments: No apparent impairments                         Following commands: Intact      Cueing Cueing Techniques: Verbal cues  Exercises Other Exercises Other Exercises: safe use of knee scooter    General Comments        Pertinent Vitals/Pain Pain Assessment Pain Assessment: Faces Faces Pain Scale: No hurt    Home Living                          Prior Function            PT Goals (current goals can now be found in the care plan section) Acute Rehab PT Goals Patient Stated Goal: for this to get better PT Goal Formulation: With patient Time For Goal Achievement: 01/10/24 Potential to Achieve Goals: Good Additional Goals Additional  Goal #1: Indep don/doff CAM boot, maintain activity restrictions for optimal protection and healing of L foot. Progress towards PT goals: Progressing toward goals    Frequency    Min 3X/week      PT Plan      Co-evaluation              AM-PAC PT 6 Clicks Mobility   Outcome Measure  Help needed turning from your back to your side while in a flat bed without using bedrails?: None Help needed moving from lying on your back to sitting on the side of a flat bed without using bedrails?: None Help needed moving to and from a bed to a chair (including a wheelchair)?: None Help needed standing up from a chair using your arms (e.g., wheelchair or  bedside chair)?: None Help needed to walk in hospital room?: A Little Help needed climbing 3-5 steps with a railing? : A Little 6 Click Score: 22    End of Session   Activity Tolerance: Patient tolerated treatment well Patient left: in bed;with call bell/phone within reach;with bed alarm set Nurse Communication: Mobility status PT Visit Diagnosis: Difficulty in walking, not elsewhere classified (R26.2)     Time: 8553-8545 PT Time Calculation (min) (ACUTE ONLY): 8 min  Charges:    $Therapeutic Activity: 8-22 mins PT General Charges $$ ACUTE PT VISIT: 1 Visit                     Dorina HERO. Fairly IV, PT, DPT Physical Therapist- [place]  [facility] 12/28/2023, 3:06 PM

## 2023-12-28 NOTE — Progress Notes (Signed)
 Discharge instructions given to patient, questions answered. IV removed without complication. Patient transporting home via car by his wife. Meds to bed were delivered to patient. Patient belongings at bedside.

## 2023-12-28 NOTE — TOC Initial Note (Signed)
 Transition of Care [facility]) - Initial/Assessment Note    Patient Details  Name: [name] MRN: [id number] Date of Birth: 1965-03-23  Transition of Care [facility]) CM/SW Contact:    Alfonso Rummer, LCSW Phone Number: 12/28/2023, 1:48 PM  Clinical Narrative:                    Home health services arranged with [facility] home [facility] contact phone [phone].      Patient Goals and CMS Choice            Expected Discharge Plan and Services                                              Prior Living Arrangements/Services                       Activities of Daily Living   ADL Screening (condition at time of admission) Independently performs ADLs?: Yes (appropriate for developmental age) Is the patient deaf or have difficulty hearing?: No Does the patient have difficulty seeing, even when wearing glasses/contacts?: No Does the patient have difficulty concentrating, remembering, or making decisions?: No  Permission Sought/Granted                  Emotional Assessment              Admission diagnosis:  Osteomyelitis of foot, left, acute (HCC) [M86.172] Acute osteomyelitis (HCC) [M86.10] Patient Active Problem List   Diagnosis Date Noted   Acute osteomyelitis (HCC) 12/25/2023   Insomnia secondary to chronic pain 12/14/2020   OSA on CPAP 12/14/2020   Binocular vision disorder with diplopia 07/27/2020   Hypersomnia with sleep apnea 04/08/2019   Irregular heart rhythm 04/08/2019   Orthostatic dizziness 04/08/2019   Lower GI bleed 04/08/2019   Iron deficiency anemia due to chronic blood loss 04/08/2019   Status post amputation of toe of right foot 04/08/2019   Obesity, Class I, BMI 30-34.9 04/08/2019   Cardiac arrhythmia 03/21/2019   Somnolence 03/21/2019   Rectal bleeding 01/25/2019   GI bleed 01/25/2019   Hypertension    Diabetes mellitus without complication (HCC)    Polyneuropathy 04/23/2018   Family history of colonic polyps  09/07/2015   Encounter for general adult medical examination without abnormal findings 03/26/2015   Influenza A 03/09/2015   Fever, unspecified 03/08/2015   Nausea and vomiting 03/08/2015   Hyperlipidemia 03/04/2015   Osteomyelitis (HCC) 03/04/2015   Edema leg 02/26/2015   Pain of right lower leg 02/26/2015   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HCC) 02/19/2015   Diabetes mellitus (HCC) 02/10/2015   Foot osteomyelitis, right (HCC) 02/04/2015   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 [place] [address] Phone: [phone] Fax: [phone]  [facility] [place], [place] - [address] [place] [address] Phone: [phone] Fax: [phone]     Social Drivers of Health (SDOH) Social History: SDOH Screenings   Food Insecurity: No Food Insecurity (12/25/2023)  Housing: Low Risk (12/25/2023)  Transportation Needs: No Transportation Needs (12/25/2023)  Utilities: Not At Risk (12/25/2023)  Tobacco Use: Low Risk (12/25/2023)  SDOH Interventions:     Readmission Risk Interventions     No data to display

## 2023-12-28 NOTE — Consult Note (Signed)
 WOC updated wound care orders for nursing staff to begin every other day dressing changes beginning 12/28/24  Remove current dressing, using a Q tip applicator pack plantar foot wound with 1/2  iodoform gauze Soila [phone]), paint incision site and periwound w/ betadine,  dress incision and plantar foot wound with nonadherent Xeroform 4 x 4 gauze Soila #[id number]) Kerlix, Ace wrap    Re consult if needed, will not follow at this time. Thanks  Josi [employer]ediger [employer], RN,CWOCN, CNS, [employer] [phone]

## 2023-12-28 NOTE — Discharge Instructions (Addendum)
 Home health services is arranged with [facility]. Please contact [facility] to Home Health questions or concerns at [phone]

## 2023-12-28 NOTE — TOC Transition Note (Signed)
 Transition of Care [facility]) - Discharge Note   Patient Details  Name: [name] MRN: [id number] Date of Birth: [birth year]  Transition of [facility]) CM/SW Contact:  Alfonso Rummer, LCSW Phone Number: 12/28/2023, 2:49 PM   Clinical Narrative:     Pt will discharge home with [facility] home health following pt upon discharge. LCSW A Kimiko Common contact [place] with [facility] home [facility] to advise pt is discharging.         Patient Goals and CMS Choice            Discharge Placement                       Discharge Plan and Services Additional resources added to the After Visit Summary for                                       Social Drivers of Health (SDOH) Interventions SDOH Screenings   Food Insecurity: No Food Insecurity (12/25/2023)  Housing: Low Risk (12/25/2023)  Transportation Needs: No Transportation Needs (12/25/2023)  Utilities: Not At Risk (12/25/2023)  Tobacco Use: Low Risk (12/25/2023)     Readmission Risk Interventions     No data to display

## 2023-12-28 NOTE — Evaluation (Signed)
 Occupational Therapy Evaluation Patient Details Name: [name] MRN: [id number] DOB: [birth year] Today's Date: 12/28/2023   History of Present Illness   presented to ER secondary to worsening L foot pain, drainage; admitted for management of acute osteomyelitis, s/p L partial 5th ray amputation (12/25)     Clinical Impressions Patient was seen for OT evaluation this date. Prior to hospital admission, patient was active and independent. Patient lives spouse and daughter who are able to assist as needed. Patient performed bed mobility/transfers/in room mobility without A, supervision provided. OT instructed on compensatory techniques and energy conservation measures to incorporate into ADL routine with good return demo from patient. OT recommending tub transfer bench, son present and OT assisted with selecting one for purchase. Patient does not require further OT, he is mod I with ADLs, OT to sign off.        If plan is discharge home, recommend the following:   A little help with walking and/or transfers     Functional Status Assessment   Patient has had a recent decline in their functional status and demonstrates the ability to make significant improvements in function in a reasonable and predictable amount of time.     Equipment Recommendations   BSC/3in1     Recommendations for Other Services         Precautions/Restrictions   Precautions Precautions: Fall Recall of Precautions/Restrictions: Intact Restrictions Weight Bearing Restrictions Per Provider Order: Yes Other Position/Activity Restrictions: WBAT with CAM boot; transfers and bathroom privileges only     Mobility Bed Mobility Overal bed mobility: Independent                  Transfers Overall transfer level: Needs assistance Equipment used: None Transfers: Sit to/from Stand Sit to Stand: Supervision                  Balance Overall balance assessment: Needs  assistance Sitting-balance support: No upper extremity supported, Feet supported Sitting balance-Leahy Scale: Normal     Standing balance support: No upper extremity supported Standing balance-Leahy Scale: Fair                             ADL either performed or assessed with clinical judgement   ADL Overall ADL's : Modified independent                                       General ADL Comments: instructed on compensatory techniques for managing CAM boot with ADLs, patient with good return demo     Vision         Perception         Praxis         Pertinent Vitals/Pain Pain Assessment Pain Assessment: 0-10 Pain Score: 2  Pain Location: L foot Pain Descriptors / Indicators: Aching Pain Intervention(s): Monitored during session     Extremity/Trunk Assessment Upper Extremity Assessment Upper Extremity Assessment: Overall WFL for tasks assessed   Lower Extremity Assessment Lower Extremity Assessment: Defer to PT evaluation       Communication Communication Communication: No apparent difficulties   Cognition Arousal: Alert Behavior During Therapy: WFL for tasks assessed/performed Cognition: No apparent impairments                               Following commands:  Intact       Cueing  General Comments   Cueing Techniques: Verbal cues      Exercises     Shoulder Instructions      Home Living Family/patient expects to be discharged to:: Private residence Living Arrangements: Spouse/significant other Available Help at Discharge: Family Type of Home: House Home Access: Stairs to enter Secretary/administrator of Steps: 1   Home Layout: One level     Bathroom Shower/Tub: Chief Strategy Officer: Standard Bathroom Accessibility: Yes How Accessible: Accessible via walker Home Equipment: Cane - single point          Prior Functioning/Environment Prior Level of Function : Independent/Modified  Independent;Working/employed;Driving                    OT Problem List:     OT Treatment/Interventions:        OT Goals(Current goals can be found in the care plan section)   Acute Rehab OT Goals Patient Stated Goal: to go home OT Goal Formulation: With patient   OT Frequency:       Co-evaluation              AM-PAC OT 6 Clicks Daily Activity     Outcome Measure Help from another person eating meals?: None Help from another person taking care of personal grooming?: None Help from another person toileting, which includes using toliet, bedpan, or urinal?: None Help from another person bathing (including washing, rinsing, drying)?: None Help from another person to put on and taking off regular upper body clothing?: None Help from another person to put on and taking off regular lower body clothing?: None 6 Click Score: 24   End of Session Nurse Communication: Mobility status  Activity Tolerance: Patient tolerated treatment well Patient left: in bed;with family/visitor present  OT Visit Diagnosis: Other abnormalities of gait and mobility (R26.89)                Time: 8975-8963 OT Time Calculation (min): 12 min Charges:  OT General Charges $OT Visit: 1 Visit OT Evaluation $OT Eval Low Complexity: 1 Low  Rogers Clause, OT/L MSOT, 12/28/2023

## 2023-12-30 ENCOUNTER — Ambulatory Visit: Payer: Self-pay | Admitting: Podiatry

## 2023-12-30 LAB — AEROBIC/ANAEROBIC CULTURE W GRAM STAIN (SURGICAL/DEEP WOUND)
Gram Stain: NONE SEEN
Gram Stain: NONE SEEN

## 2023-12-30 LAB — GLUCOSE, CAPILLARY: Glucose-Capillary: 124 mg/dL — ABNORMAL HIGH (ref 70–99)

## 2023-12-31 ENCOUNTER — Telehealth: Payer: Self-pay | Admitting: Podiatry

## 2023-12-31 LAB — SURGICAL PATHOLOGY

## 2023-12-31 LAB — CULTURE, BLOOD (ROUTINE X 2)
Culture: NO GROWTH
Culture: NO GROWTH
Special Requests: ADEQUATE
Special Requests: ADEQUATE

## 2023-12-31 NOTE — Telephone Encounter (Signed)
 Patient surgery has been cancelled as patient was seen at ED over holiday and they did surgery at that time

## 2024-01-02 ENCOUNTER — Encounter: Admitting: Podiatry

## 2024-01-02 ENCOUNTER — Ambulatory Visit

## 2024-01-02 ENCOUNTER — Ambulatory Visit (INDEPENDENT_AMBULATORY_CARE_PROVIDER_SITE_OTHER)

## 2024-01-02 ENCOUNTER — Telehealth: Payer: Self-pay | Admitting: Podiatry

## 2024-01-02 DIAGNOSIS — Z0279 Encounter for issue of other medical certificate: Secondary | ICD-10-CM

## 2024-01-02 DIAGNOSIS — M869 Osteomyelitis, unspecified: Secondary | ICD-10-CM | POA: Diagnosis not present

## 2024-01-02 NOTE — Progress Notes (Signed)
 "  Subjective:  Patient ID: [name], male    DOB: [birth year],  MRN: [id number]  Chief Complaint  Patient presents with   Routine Post [facility] POV #1 DOS 12/26/23 partial 5th ray amputation with underlying wound that needs packing 5th L toe-DrSABRA Sar patient     DOS: 12/26/23 Procedure: Left 5th ray amputation with Dr. Sar, DPM  58 y.o. male returns for post-op check.  He is doing well in his recovery.  He is still on Augmentin  that he was discharged on.  Pathology was unable to assess the margins adequately to determine if all infected bone was removed.  His wife has been doing his dressing changes once daily.  He states that home health is starting today to come and do the dressing changes.  Review of Systems: Negative except as noted in the HPI. Denies N/V/F/Ch.  Past Medical History:  Diagnosis Date   Broken ankle    Broken arm    Cataracts    Diabetes mellitus without complication (HCC)    type 2   Diabetic retinopathy (HCC)    PDR OU   History of Bell's palsy    Hyperlipidemia    Hypertension    Osteomyelitis (HCC)    foot   Sleep apnea    wears CPAP   Current Medications[1]  Tobacco Use History[2]  Allergies[3] Objective:  There were no vitals filed for this visit. There is no height or weight on file to calculate BMI. Constitutional Well developed. Well nourished.  Vascular Foot warm and well perfused. Capillary refill normal to all digits.   Neurologic Normal speech. Oriented to person, place, and time. Epicritic sensation to light touch grossly present bilaterally.  Dermatologic Skin healing well without signs of continued infection. Skin edges well coapted without signs of infection. Mild edema around surgical site.  Staples intact. Plantar ulcer location remains open.  Measures 0.7 cm x 0.5 cm by approximately 3.5 cm in depth.  Probes into former fifth metatarsal head space.  No purulence or malodor.  No sign of continued infection.   Orthopedic: Mild tenderness to palpation noted about the surgical site within normal limits.        Radiographs: Evidence of partial fifth ray amputation to the left foot are present.  There are skin staples in radiograph.  No cortical erosions at amputation margin visible on radiograph.  No complications identified.  Pathology: FINAL DIAGNOSIS        1. Toe(s), amputation, left fifth metatarsal proximal margin :       -  ACUTE OSTEOMYELITIS PRESENT ON SEPARATE FRAGMENT OF BONE WITH CARTILAGE IN A       BACKGROUND OF CHRONIC OSTEOMYELITIS AND SEROUS ATROPHY.       NOTE: MARGINS UNABLE TO BE ASSESSED GIVEN RECEPTIONIST SEPARATE FRAGMENTS AND       LACK OF ORIENTATION.   Microbiology:    Component Ref Range & Units (hover) 7 d ago  Specimen Description BONE  Special Requests LEFT 5TH METATARSAL SAMPLE C  Gram Stain NO WBC SEEN NO ORGANISMS SEEN  Culture MODERATE STREPTOCOCCUS GROUP G Beta hemolytic streptococci are predictably susceptible to penicillin and other beta lactams. Susceptibility testing not routinely performed. FEW ENTEROCOCCUS FAECALIS SUSCEPTIBILITIES PERFORMED ON PREVIOUS CULTURE WITHIN THE LAST 5 DAYS. RARE PREVOTELLA SPECIES BETA LACTAMASE NEGATIVE Performed at [facility], [address], [place], [place] [address]      Assessment:   1. Osteomyelitis of fifth toe of left  foot [facility])    Plan:  Patient was evaluated and treated and all questions answered. Doing well in post operative recovery.   S/p Left foot surgery  -Progressing as expected post-operatively. -XR: As above, as expected -WB Status: Weight bearing for pivots and transfers only in CAM walker. - Staples: intact, will be removed at next visit in 2 weeks.  Today, the packing in his wound was removed, wound irrigated with sterile saline and then packing with quarter inch iodoform reapplied.  A dry sterile dressing was then applied on top of this.  Home health is to replace this dressing  once daily. -He will continue to monitor the area, if he has any increasing redness or signs of recurrent infection he will let us  know urgently. -Medications: Continue antibiotics per infectious disease -Foot redressed. -XR at next visit: not needed  RTC 2 weeks  Prentice Ovens, DPM     [1]  Current Outpatient Medications:    amoxicillin  (AMOXIL ) 500 MG capsule, Take 2 capsules (1,000 mg total) by mouth 2 (two) times daily for 14 days., Disp: 56 capsule, Rfl: 0   amoxicillin -clavulanate (AUGMENTIN ) 875-125 MG tablet, Take 1 tablet by mouth 2 (two) times daily., Disp: 14 tablet, Rfl: 0   Continuous Blood Gluc Sensor (FREESTYLE LIBRE 14 DAY SENSOR) MISC, SMARTSIG:1 Topical Every 2 Weeks, Disp: , Rfl:    DULoxetine  (CYMBALTA ) 30 MG capsule, Take 30 mg by mouth daily., Disp: , Rfl:    empagliflozin (JARDIANCE) 25 MG TABS tablet, Take 25 mg by mouth daily., Disp: , Rfl:    fluticasone (FLONASE) 50 MCG/ACT nasal spray, Place 2 sprays into both nostrils daily as needed for allergies or rhinitis., Disp: , Rfl:    HUMALOG KWIKPEN 200 UNIT/ML KwikPen, SMARTSIG:15 Unit(s) SUB-Q 3 Times Daily (Patient not taking: Reported on 12/28/2023), Disp: , Rfl:    latanoprost  (XALATAN ) 0.005 % ophthalmic solution, Place 1 drop into both eyes at bedtime., Disp: , Rfl:    LYUMJEV KWIKPEN 100 UNIT/ML KwikPen, Lyumjev Rx for Novolog  is 22 units prior to breakfast, 18 units with lunch, 26 units with dinner Subcutaneous as directed; Duration: 30 days, Disp: , Rfl:    metFORMIN  (GLUCOPHAGE ) 1000 MG tablet, Take 1,000 mg by mouth 2 (two) times daily., Disp: , Rfl:    MOUNJARO 12.5 MG/0.5ML Pen, Inject 12.5 mg into the skin once a week., Disp: , Rfl:    Multiple Vitamin (MULTIVITAMIN) tablet, Take 1 tablet by mouth daily., Disp: , Rfl:    [Paused] Olmesartan -Amlodipine -HCTZ 40-10-25 MG TABS, Take 1 tablet by mouth daily. Reported on 02/04/2015, Disp: , Rfl:    omega-3 acid ethyl esters (LOVAZA ) 1 g capsule, Take 1 capsule  by mouth 2 (two) times daily., Disp: , Rfl:    oxyCODONE  (OXY IR/ROXICODONE ) 5 MG immediate release tablet, Take 1 tablet (5 mg total) by mouth every 6 (six) hours as needed for moderate pain (pain score 4-6)., Disp: 15 tablet, Rfl: 0   rosuvastatin  (CRESTOR ) 10 MG tablet, Take 10 mg by mouth daily., Disp: , Rfl:    sildenafil (VIAGRA) 100 MG tablet, One by mouth daily when necessary, Disp: , Rfl:    silver  sulfADIAZINE  (SILVADENE ) 1 % cream, Apply 1 Application topically daily. (Patient not taking: Reported on 12/28/2023), Disp: 30 g, Rfl: 1   terbinafine  (LAMISIL ) 250 MG tablet, Take 1 tablet (250 mg total) by mouth daily., Disp: 90 tablet, Rfl: 0 [2]  Social History Tobacco Use  Smoking Status Never  Smokeless Tobacco Never  [3]  Allergies Allergen Reactions   Robinul [Glycopyrrolate]     Caused throat muscles to relax and pt couldn't swallow   "

## 2024-01-02 NOTE — Telephone Encounter (Signed)
 Patient contacted answering service, returned patient call.  He was seen in the office earlier today with Dr. Magdalen for postoperative care for partial fifth ray amputation on 12/26/2023.  States that he originally was supposed to have home health come out today for assistance with postop dressing changes as he has ulceration which requires packing daily.  He is currently out of supplies.  Advised that him and his wife continue daily dressing changes using Betadine soaked gauze gently packed within the ulceration site with dry gauze bandage over the incision until home health wound care assistance can be established.  He states that this will likely be Friday, delayed likely due to the holiday.

## 2024-01-07 ENCOUNTER — Encounter: Payer: Self-pay | Admitting: Lab

## 2024-01-16 ENCOUNTER — Ambulatory Visit (INDEPENDENT_AMBULATORY_CARE_PROVIDER_SITE_OTHER)

## 2024-01-16 DIAGNOSIS — E08621 Diabetes mellitus due to underlying condition with foot ulcer: Secondary | ICD-10-CM

## 2024-01-16 DIAGNOSIS — M869 Osteomyelitis, unspecified: Secondary | ICD-10-CM

## 2024-01-16 DIAGNOSIS — L97522 Non-pressure chronic ulcer of other part of left foot with fat layer exposed: Secondary | ICD-10-CM

## 2024-01-21 ENCOUNTER — Ambulatory Visit: Admitting: Podiatry

## 2024-01-21 ENCOUNTER — Ambulatory Visit (INDEPENDENT_AMBULATORY_CARE_PROVIDER_SITE_OTHER)

## 2024-01-21 ENCOUNTER — Encounter: Payer: Self-pay | Admitting: Podiatry

## 2024-01-21 VITALS — Ht 74.0 in | Wt 243.6 lb

## 2024-01-21 DIAGNOSIS — M216X2 Other acquired deformities of left foot: Secondary | ICD-10-CM

## 2024-01-21 DIAGNOSIS — M869 Osteomyelitis, unspecified: Secondary | ICD-10-CM

## 2024-01-21 MED ORDER — AMOXICILLIN-POT CLAVULANATE 875-125 MG PO TABS: 1.0000 | ORAL_TABLET | Freq: Two times a day (BID) | ORAL | 0 refills | Status: DC

## 2024-01-21 NOTE — Progress Notes (Signed)
 "  Subjective:  Patient ID: [name], male    DOB: 23-Jun-1965,  MRN: [id number]  Chief Complaint  Patient presents with   Routine Post Op    PO    DOS: 12/26/23 Procedure: Left 5th ray amputation with Dr. Janit, DPM  59 y.o. male returns for post-op check.  He is doing well in his recovery.  He is still on Augmentin  that he was discharged on.  Pathology was unable to assess the margins adequately to determine if all infected bone was removed.  His wife has been doing his dressing changes once daily.  He states that home health has been coming and they relate the ulceration has gotten smaller.   Review of Systems: Negative except as noted in the HPI. Denies N/V/F/Ch.  Past Medical History:  Diagnosis Date   Broken ankle    Broken arm    Cataracts    Diabetes mellitus without complication (HCC)    type 2   Diabetic retinopathy (HCC)    PDR OU   History of Bell's palsy    Hyperlipidemia    Hypertension    Osteomyelitis (HCC)    foot   Sleep apnea    wears CPAP   Current Medications[1]  Tobacco Use History[2]  Allergies[3] Objective:  There were no vitals filed for this visit. There is no height or weight on file to calculate BMI. Constitutional Well developed. Well nourished.  Vascular Foot warm and well perfused. Capillary refill normal to all digits.   Neurologic Normal speech. Oriented to person, place, and time. Epicritic sensation to light touch grossly present bilaterally.  Dermatologic Skin healing well without signs of continued infection. Skin edges well coapted without signs of infection. Mild edema around surgical site.  Staples intact. No dehiscence upon staple removal.  Plantar ulcer location remains open.  Measures 0.4 cm x 0.3 cm by approximately 2.2 cm in depth.  Probes into former fifth metatarsal head space.  No purulence or malodor.  No sign of continued infection. Deep tissue injury to dorsal foot with ecchymosis. No stable open wound or  active infection.   Orthopedic: Mild tenderness to palpation noted about the surgical site within normal limits.           Pathology: FINAL DIAGNOSIS        1. Toe(s), amputation, left fifth metatarsal proximal margin :       -  ACUTE OSTEOMYELITIS PRESENT ON SEPARATE FRAGMENT OF BONE WITH CARTILAGE IN A       BACKGROUND OF CHRONIC OSTEOMYELITIS AND SEROUS ATROPHY.       NOTE: MARGINS UNABLE TO BE ASSESSED GIVEN RECEPTIONIST SEPARATE FRAGMENTS AND       LACK OF ORIENTATION.   Microbiology:    Component Ref Range & Units (hover) 7 d ago  Specimen Description BONE  Special Requests LEFT 5TH METATARSAL SAMPLE C  Gram Stain NO WBC SEEN NO ORGANISMS SEEN  Culture MODERATE STREPTOCOCCUS GROUP G Beta hemolytic streptococci are predictably susceptible to penicillin and other beta lactams. Susceptibility testing not routinely performed. FEW ENTEROCOCCUS FAECALIS SUSCEPTIBILITIES PERFORMED ON PREVIOUS CULTURE WITHIN THE LAST 5 DAYS. RARE PREVOTELLA SPECIES BETA LACTAMASE NEGATIVE Performed at [facility], [address], [place], [place] [address]      Assessment:   1. Osteomyelitis of fifth toe of left foot (HCC)   2. 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 Doing well in post operative recovery.   S/p Left foot surgery  -Progressing as expected post-operatively. -XR: As above, as expected -WB Status: Weight bearing for pivots and transfers only in CAM walker. - Staples: removed today without incident or dehiscence.  Today, the packing in his wound was removed, wound irrigated with sterile saline and then packing with quarter inch iodoform reapplied.  A dry sterile dressing was then applied on top of this.  Home health is to replace this dressing every other day. I talked to him about the deep tissue  injury on dorsal foot and applied xeroform to it. I emphasized that dressings should not be put on tightly.  -Dressing should be changed every other day.  -He will continue to monitor the area, if he has any increasing redness or signs of recurrent infection he will let us  know urgently. -Medications: Continue antibiotics per infectious disease -Foot redressed. -XR at next visit: not needed  RTC 2 weeks with Dr. Janit Prentice Ovens, DPM     [1]  Current Outpatient Medications:    amoxicillin -clavulanate (AUGMENTIN ) 875-125 MG tablet, Take 1 tablet by mouth 2 (two) times daily., Disp: 14 tablet, Rfl: 0   Continuous Blood Gluc Sensor (FREESTYLE LIBRE 14 DAY SENSOR) MISC, SMARTSIG:1 Topical Every 2 Weeks, Disp: , Rfl:    DULoxetine  (CYMBALTA ) 30 MG capsule, Take 30 mg by mouth daily., Disp: , Rfl:    empagliflozin (JARDIANCE) 25 MG TABS tablet, Take 25 mg by mouth daily., Disp: , Rfl:    fluticasone (FLONASE) 50 MCG/ACT nasal spray, Place 2 sprays into both nostrils daily as needed for allergies or rhinitis., Disp: , Rfl:    HUMALOG KWIKPEN 200 UNIT/ML KwikPen, SMARTSIG:15 Unit(s) SUB-Q 3 Times Daily (Patient not taking: Reported on 12/28/2023), Disp: , Rfl:    latanoprost  (XALATAN ) 0.005 % ophthalmic solution, Place 1 drop into both eyes at bedtime., Disp: , Rfl:    LYUMJEV KWIKPEN 100 UNIT/ML KwikPen, Lyumjev Rx for Novolog  is 22 units prior to breakfast, 18 units with lunch, 26 units with dinner Subcutaneous as directed; Duration: 30 days, Disp: , Rfl:    metFORMIN  (GLUCOPHAGE ) 1000 MG tablet, Take 1,000 mg by mouth 2 (two) times daily., Disp: , Rfl:    MOUNJARO 12.5 MG/0.5ML Pen, Inject 12.5 mg into the skin once a week., Disp: , Rfl:    Multiple Vitamin (MULTIVITAMIN) tablet, Take 1 tablet by mouth daily., Disp: , Rfl:    [Paused] Olmesartan -Amlodipine -HCTZ 40-10-25 MG TABS, Take 1 tablet by mouth daily. Reported on 02/04/2015, Disp: , Rfl:    omega-3 acid ethyl esters (LOVAZA ) 1 g  capsule, Take 1 capsule by mouth 2 (two) times daily., Disp: , Rfl:    oxyCODONE  (OXY IR/ROXICODONE ) 5 MG immediate release tablet, Take 1 tablet (5 mg total) by mouth every 6 (six) hours as needed for moderate pain (pain score 4-6)., Disp: 15 tablet, Rfl: 0   rosuvastatin  (CRESTOR ) 10 MG tablet, Take 10 mg by mouth daily., Disp: , Rfl:    sildenafil (VIAGRA) 100 MG tablet, One by mouth daily when necessary, Disp: , Rfl:    silver  sulfADIAZINE  (SILVADENE ) 1 % cream, Apply 1 Application topically daily. (Patient not taking: Reported on 12/28/2023), Disp: 30 g, Rfl: 1   terbinafine  (LAMISIL ) 250 MG tablet, Take 1 tablet (250 mg total) by mouth daily., Disp: 90 tablet, Rfl: 0 [2]  Social History Tobacco Use  Smoking Status Never  Smokeless Tobacco  Never  [3]  Allergies Allergen Reactions   Robinul [Glycopyrrolate]     Caused throat muscles to relax and pt couldn't swallow   "

## 2024-01-21 NOTE — Patient Instructions (Signed)
 Preparing for Surgery      Thank you for choosing [facility] for your surgical care. Our board-certified and board-qualified physicians bring advanced training and a deep commitment to the highest standards in foot and ankle surgery.   From your first consultation to your final steps in recovery, our team is here to ensure you feel informed, supported, and confident throughout the entire process.   Visit [email or web address] to access our Surgery Patient page, where youll find all of the information listed below, plus additional helpful resources.      The Time of Your Surgery   For hospital procedures, your surgery time will be provided at the time of scheduling. If youre scheduled at [facility], youll receive a call from the surgical center 24 hours before your procedure with your confirmed time.?Please refer to the surgery information provided to you during your surgical consultation to determine where your surgery will take place.  Recommended Devices for Easier Recovery  Devices such as a knee scooter, crutches, walker, or wheelchair may or may not be covered by your insurance. If your surgeon has recommended this after surgery and it is not covered by your insurance you are still responsible for obtaining and using the recommended equipment. If you have questions or concerns about what equipment you will need please contact the office.  To support a smoother recovery, weve gathered a list of helpful, recommended equipment. Visit [email or web address] to see the full list.       Taking Medications?   If you are taking daily heart and blood pressure medications, seizure, reflux, allergy, asthma, anxiety, pain, or diabetes medications, make sure you notify the surgery center/hospital before the day of surgery so they can tell you which medications you should take or avoid the day of surgery.    GLP-1 antagonists taken weekly  should be stopped for 7 days before surgery. GLP-1 antagonists taken daily be stopped on the day of surgery. These include:   Dulaglutide (Trulicity)   Exenatide extended release (Bydureon BCise)   Exenatide (Byetta)   Semaglutide (Ozempic; Birch.brandt)   Tirzepatide (Mounjaro)   Liraglutide (Victoza, Saxenda)   Lixisenatide (Adlyxin)   Phentermine (Adipex-P, Lomaira)   Semaglutide (Rybelsus) is oral and should be held for 3 days.   Metformin - should be held for 2 days prior to surgery.   SGLT2 inhibitors should be stopped for 3 days (no longer because of the risk of euglycemic diabetic ketoacidosis) and include:   Canagliflozin (Invokana)   Ertugliflozin (Steglatro)   Dapagliflozin (Farxiga)   Empagliflozin (Jardiance)   Additional medications to stop:   If you take blood thinners (Warfarin, Eliquis, Xarelto), please consult your doctor about stopping before your procedure.   Aspirin: Consult your doctor about stopping 1 week before your procedure.   Anti-inflammatory medications (such as ibuprofen)        Pre-Operative Instructions   Plan to be at the hospital at least an hour and a half (1.5), or at the surgery center (1) hour before your scheduled time, unless otherwise directed by the surgical center/hospital staff. You must have a responsible adult accompany you, remain during the surgery, and drive you home. Make sure you have directions to the surgical center/hospital to ensure you arrive on time.       [facility] [place]             [address]    [address]  [place], [place] [address]    [place], [place] [address]                 [phone]      [facility]   [facility] [place]         [address]                [address]          [place], [place] [address]                            [place], [place] [address]     [facility]         [address]          [place], [place] [address]      If you are having surgery at [facility] or [facility], you will need a copy of your medical history and physical form from your family physician within one month before the date of surgery. We will give you a form for your primary physician to complete.       We will make every effort to accommodate the date you request for surgery. However, there are times when surgery dates or times need to be moved. We will contact you as soon as possible if a schedule change is required.     No aspirin/ibuprofen for one week before surgery. If you are on aspirin, any non-steroidal anti-inflammatory medications (Mobic, Aleve, Ibuprofen) should not be taken seven (7) days before surgery.       No food or drink after midnight the night before surgery unless directed otherwise by the surgical center/hospital staff. If you are having a surgical procedure in our office, and not in the surgical center or hospital, this does not apply to you.     No alcoholic beverages 24 hours before surgery. No smoking 24 hours before or 24 hours after surgery.      Wear loose pants or shorts. They should be loose enough to fit over bandages, boots, and casts.     Do not wear slip-on shoes. Sneakers are preferred.      If you were given a boot during your surgery consultation appointment, be sure to bring it with you to the surgery center/hospital on the day of your surgery. Also, bring crutches, a knee scooter, or a walker if your physician prescribed it for you before your surgery date.      If you have not been contacted by the surgery center/hospital by the day before your surgery, call the surgery center/hospital to confirm the date and time of your surgery.     Leave time from work may vary depending on the type of surgery you have. Appropriate arrangements should be made before surgery with your employer.     Prescriptions will be electronically submitted  to your pharmacy the evening before your surgery or the day of your surgery. Take the medication as directed. Pain medications will not be refilled on weekends and must be approved by the doctor.      Remove nail polish on the operative foot and avoid getting a pedicure two weeks before surgery.      The night before surgery, wash the foot and leg that is being operated on with water and the antibacterial soap that was provided at your consultation appointment. The antibacterial soap is encased in the  brush. You will need to wet the brush to cleanse the area. Be sure to pay special attention to beneath the toenails and in between the toes. Wash for at least three (3) minutes. Rinse thoroughly with water and pat dry with a towel. Perform this wash unless told not to do so by your physician.    If you have any questions regarding the scheduling of your surgery, please contact our surgical department at [phone]   If you have any questions regarding any of these instructions, please do not hesitate to call our triage nurse at [phone]

## 2024-01-21 NOTE — Progress Notes (Signed)
 "  Chief Complaint  Patient presents with   Diabetic Ulcer    Pt is here due to ulcer to the bottom of his left foot, wife was concern due to smell, thinks it may be infected.    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  DOS: 12/26/2023.  Discharged on 1 week of oral Augmentin  875/125 mg as per ID recommendations.  Patient is concerned due to wound developing down to the plantar aspect of the forefoot around the fourth toe  Past Medical History:  Diagnosis Date   Broken ankle    Broken arm    Cataracts    Diabetes mellitus without complication (HCC)    type 2   Diabetic retinopathy (HCC)    PDR OU   History of Bell's palsy    Hyperlipidemia    Hypertension    Osteomyelitis (HCC)    foot   Sleep apnea    wears CPAP    Past Surgical History:  Procedure Laterality Date   AMPUTATION Right 03/04/2015   Procedure: PARTIAL AMPUTATION RIGHT 5TH METATARSAL;  Surgeon: Jeb Broach, DPM;  Location: [facility] OR;  Service: Podiatry;  Laterality: Right;   AMPUTATION Left 12/26/2023   Procedure: AMPUTATION, FOOT, RAY;  Surgeon: Janit Thresa HERO, DPM;  Location: [facility] ORS;  Service: Orthopedics/Podiatry;  Laterality: Left;   APPENDECTOMY     COLONOSCOPY  11/17/2005   TAs - Armbruster   COLONOSCOPY N/A 01/25/2019   Procedure: COLONOSCOPY;  Surgeon: Kristie Lamprey, MD;  Location: [facility] ENDOSCOPY;  Service: Endoscopy;  Laterality: N/A;   EYE SURGERY Bilateral    blood vessels -cautery   HEMOSTASIS CLIP PLACEMENT  01/25/2019   Procedure: HEMOSTASIS CLIP PLACEMENT;  Surgeon: Kristie Lamprey, MD;  Location: [facility] ENDOSCOPY;  Service: Endoscopy;;   I & D EXTREMITY Right 03/04/2015   Procedure: IRRIGATION AND DEBRIDEMENT RIGHT FOOT;  Surgeon: Jeb Broach, DPM;  Location: [facility] OR;  Service: Podiatry;  Laterality: Right;   WISDOM TOOTH EXTRACTION     only 2 ext    Allergies[1]  Aerobic/Anaerobic Culture w Gram Stain (surgical/deep wound) Order: [id number]   Status: Final result     Next appt: 01/29/2024 at 08:45 AM in Podiatry [place] HERO Janit, DPM)   Test Result Released: Yes (not seen)   Specimen Information: Wound; Tissue  0 Result Notes     View Follow-Up Encounter    Component Ref Range & Units (hover) 3 wk ago  Specimen Description BONE  Special Requests LEFT 5TH METATARSAL SAMPLE C  Gram Stain NO WBC SEEN NO ORGANISMS SEEN  Culture MODERATE STREPTOCOCCUS GROUP G Beta hemolytic streptococci are predictably susceptible to penicillin and other beta lactams. Susceptibility testing not routinely performed. FEW ENTEROCOCCUS FAECALIS SUSCEPTIBILITIES PERFORMED ON PREVIOUS CULTURE WITHIN THE LAST 5 DAYS. RARE PREVOTELLA SPECIES BETA LACTAMASE NEGATIVE Performed at [facility], [address] [place], [place] [address]  Report Status 12/30/2023 FINAL      Objective/Physical Exam The incision site along the lateral column of the left foot has nicely healed.  There continues to be some superficial skin breakdown to the anterior aspect of the ankle.  The wound to the plantar aspect of the fifth MTP amputation site has mostly resolved it is very small and stable with good healing.  New wound development noted to the plantar aspect of the fourth metatarsal head secondary to a transfer pressure lesion.  It is significant and rapidly progressed just over the last few weeks.  Radiographic Exam LT foot 01/21/2024:  Partial fifth ray amputation noted with osteotomy at the midportion of the fifth metatarsal diaphysis.  There does appear to be some osseous irregularity around the osteotomy site extending along the cortices of the fifth metatarsal as well.  Will plan to simply observe for now  Assessment: 1. s/p partial fifth ray amputation left. DOS: 12/26/2023 2.  Osteomyelitis left foot 3.  Acute onset of transfer pressure lesion plantar aspect of the fourth MTP left   Plan of Care:  -Patient was evaluated. X-rays reviewed concerning  for irregularities around the remaining portion of the fifth metatarsal - Refill prescription for Augmentin  875/125 mg x 10 days -Patient does not have a follow-up with infectious disease.  Recommend follow-up.  Referral placed -The wound that has developed now to the fourth MTP has progressed rather rapidly.  Currently there is no exposed bone or joint however I am concerned that this may continue to progress and become a full ulcer leading to additional amputation of the foot.  I do believe that prevented a floating metatarsal osteotomy at the neck of the metatarsal may help to alleviate pressure from the adjacent ray and potentially stop the progression of the transfer lesion developing.  This was discussed in detail with the patient.  He agrees and is amenable to this plan.  Risk benefits advantages and disadvantages were explained.  No guarantees were expressed or implied -Authorization for surgery was initiated today.  Surgery will consist of floating fourth metatarsal osteotomy left -Return to clinic 1 week postop   *Goes by [employer].  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   Thresa EMERSON Sar, DPM [facility]  Dr. Thresa EMERSON Sar, DPM    [address] [place], [place] [address]                Office [phone]  Fax [phone]          [1]  Allergies Allergen Reactions   Robinul [Glycopyrrolate]     Caused throat muscles to relax and pt couldn't swallow   "

## 2024-01-22 ENCOUNTER — Telehealth: Payer: Self-pay | Admitting: Podiatry

## 2024-01-22 NOTE — Telephone Encounter (Signed)
 Called and scheduled patient for this Thursday 01/24/2024. Patient is on Mounjaro but will be off of it for 7+ days prior to surgery. Patient not on blood thinners and pharmacy correct in chart.

## 2024-01-22 NOTE — Telephone Encounter (Signed)
 DOS- 01/24/2024  METATARSAL OSTEOTOMY 2-5 4TH LT- 28308  [employer] EFFECTIVE DATE- 01/02/2022  DEDUCTIBLE- $0 REMAINING- $0 OOP- $4000 REMAINING- $3906.85 FAMILY DEDUCTIBLE- $0 REMAINING- $0 FAMILY OOP- $8000 REMAINING- $7860.79 COINSURANCE- 0%  PER [employer] PORTAL, PRIOR AUTH IS NOT REQUIRED FOR CPT CODE 71691.

## 2024-01-24 ENCOUNTER — Other Ambulatory Visit: Payer: Self-pay | Admitting: Podiatry

## 2024-01-24 MED ORDER — OXYCODONE-ACETAMINOPHEN 5-325 MG PO TABS: 1.0000 | ORAL_TABLET | ORAL | 0 refills | Status: AC | PRN

## 2024-01-24 NOTE — Progress Notes (Signed)
 PRN postop

## 2024-01-25 ENCOUNTER — Encounter: Admitting: Podiatry

## 2024-01-29 ENCOUNTER — Ambulatory Visit (INDEPENDENT_AMBULATORY_CARE_PROVIDER_SITE_OTHER)

## 2024-01-29 ENCOUNTER — Ambulatory Visit (INDEPENDENT_AMBULATORY_CARE_PROVIDER_SITE_OTHER): Admitting: Podiatry

## 2024-01-29 ENCOUNTER — Encounter: Admitting: Podiatry

## 2024-01-29 ENCOUNTER — Ambulatory Visit: Attending: Infectious Diseases | Admitting: Infectious Diseases

## 2024-01-29 VITALS — BP 133/76 | HR 68 | Temp 97.3°F | Ht 74.0 in | Wt 245.0 lb

## 2024-01-29 DIAGNOSIS — Z794 Long term (current) use of insulin: Secondary | ICD-10-CM | POA: Insufficient documentation

## 2024-01-29 DIAGNOSIS — M869 Osteomyelitis, unspecified: Secondary | ICD-10-CM

## 2024-01-29 DIAGNOSIS — Z89432 Acquired absence of left foot: Secondary | ICD-10-CM | POA: Insufficient documentation

## 2024-01-29 DIAGNOSIS — E11628 Type 2 diabetes mellitus with other skin complications: Secondary | ICD-10-CM

## 2024-01-29 DIAGNOSIS — Z7984 Long term (current) use of oral hypoglycemic drugs: Secondary | ICD-10-CM | POA: Insufficient documentation

## 2024-01-29 DIAGNOSIS — B952 Enterococcus as the cause of diseases classified elsewhere: Secondary | ICD-10-CM | POA: Insufficient documentation

## 2024-01-29 DIAGNOSIS — E1169 Type 2 diabetes mellitus with other specified complication: Secondary | ICD-10-CM | POA: Insufficient documentation

## 2024-01-29 DIAGNOSIS — L97526 Non-pressure chronic ulcer of other part of left foot with bone involvement without evidence of necrosis: Secondary | ICD-10-CM | POA: Insufficient documentation

## 2024-01-29 DIAGNOSIS — E11621 Type 2 diabetes mellitus with foot ulcer: Secondary | ICD-10-CM | POA: Insufficient documentation

## 2024-01-29 DIAGNOSIS — Z7985 Long-term (current) use of injectable non-insulin antidiabetic drugs: Secondary | ICD-10-CM | POA: Insufficient documentation

## 2024-01-29 DIAGNOSIS — M868X7 Other osteomyelitis, ankle and foot: Secondary | ICD-10-CM | POA: Insufficient documentation

## 2024-01-29 MED ORDER — AMOXICILLIN-POT CLAVULANATE 875-125 MG PO TABS: 1.0000 | ORAL_TABLET | Freq: Two times a day (BID) | ORAL | 0 refills | Status: AC

## 2024-01-29 NOTE — Patient Instructions (Signed)
 You are here for the left foot infection- the surgical site looks good- Will discuss with Dr.Evans regarding the duration of antibiotic once I have all the results

## 2024-01-29 NOTE — Progress Notes (Signed)
 NAME: [name]  DOB: 01-16-1965  MRN: [id number]  Date/Time: 01/29/2024 10:10 AM   Subjective:   ? [name] is a 59 y.o. with a history of DM, DFI, was in [facility] 12/23-12/26 hospitaized for DFI and underwent 5th ray amputation of the left foot- Culture enterococcus  Sent on high dose augmenti Xr 2 grams BID for 2 weeks and later received 10 days of 875 mg BID Underwent 4th met osteotomy last Thursday- unable to see those records in [facility] Culture and pathology sent as per Dr.Evans Pt is referred back by Dr.Evans He is doing okay No fever or chills   Past Medical History:  Diagnosis Date   Broken ankle    Broken arm    Cataracts    Diabetes mellitus without complication (HCC)    type 2   Diabetic retinopathy (HCC)    PDR OU   History of Bell's palsy    Hyperlipidemia    Hypertension    Osteomyelitis (HCC)    foot   Sleep apnea    wears CPAP    Past Surgical History:  Procedure Laterality Date   AMPUTATION Right 03/04/2015   Procedure: PARTIAL AMPUTATION RIGHT 5TH METATARSAL;  Surgeon: Jeb Broach, DPM;  [facility] OR;  Service: Podiatry;  Laterality: Right;   AMPUTATION Left 12/26/2023   Procedure: AMPUTATION, FOOT, RAY;  Surgeon: Janit Thresa HERO, DPM;  Location: [facility] ORS;  Service: Orthopedics/Podiatry;  Laterality: Left;   APPENDECTOMY     COLONOSCOPY  11/17/2005   TAs - Armbruster   COLONOSCOPY N/A 01/25/2019   Procedure: COLONOSCOPY;  Surgeon: Kristie Lamprey, MD;  Location: [facility] ENDOSCOPY;  Service: Endoscopy;  Laterality: N/A;   EYE SURGERY Bilateral    blood vessels -cautery   HEMOSTASIS CLIP PLACEMENT  01/25/2019   Procedure: HEMOSTASIS CLIP PLACEMENT;  Surgeon: Kristie Lamprey, MD;  Location: [facility] ENDOSCOPY;  Service: Endoscopy;;   I & D EXTREMITY Right 03/04/2015   Procedure: IRRIGATION AND DEBRIDEMENT RIGHT FOOT;  Surgeon: Jeb Broach, DPM;  Location: [facility] OR;  Service: Podiatry;  Laterality: Right;   WISDOM TOOTH EXTRACTION     only 2 ext     Social History   Socioeconomic History   Marital status: Married    Spouse name: tonia   Number of children: 3   Years of education: Not on file   Highest education level: 12th grade  Occupational History   Not on file  Tobacco Use   Smoking status: Never   Smokeless tobacco: Never  Vaping Use   Vaping status: Never Used  Substance and Sexual Activity   Alcohol use: No   Drug use: No   Sexual activity: Not on file  Other Topics Concern   Not on file  Social History Narrative   Lives wih wife and 1 adopted daughter   Right handed    Caffeine: 1 cup of coffee a day   Social Drivers of Health   Tobacco Use: Low Risk (01/21/2024)   Patient History    Smoking Tobacco Use: Never    Smokeless Tobacco Use: Never    Passive Exposure: Not on file  Financial Resource Strain: Not on file  Food Insecurity: No Food Insecurity (12/25/2023)   Epic    Worried About Programme Researcher, Broadcasting/film/video in the Last Year: Never true    Ran Out of Food in the Last Year: Never true  Transportation Needs: No Transportation Needs (12/25/2023)   Epic    Lack of Transportation (Medical): No  Lack of Transportation (Non-Medical): No  Physical Activity: Not on file  Stress: Not on file  Social Connections: Not on file  Intimate Partner Violence: Not At Risk (12/25/2023)   Epic    Fear of Current or Ex-Partner: No    Emotionally Abused: No    Physically Abused: No    Sexually Abused: No  Depression (PHQ2-9): Not on file  Alcohol Screen: Not on file  Housing: Low Risk (12/25/2023)   Epic    Unable to Pay for Housing in the Last Year: No    Number of Times Moved in the Last Year: 0    Homeless in the Last Year: No  Utilities: Not At Risk (12/25/2023)   Epic    Threatened with loss of utilities: No  Health Literacy: Not on file    Family History  Problem Relation Age of Onset   Diabetes Father    Colon cancer Father    Colon polyps Father    Esophageal cancer Neg Hx    Rectal cancer Neg Hx     Stomach cancer Neg Hx    Sleep apnea Neg Hx    Allergies[1] I? Current Outpatient Medications  Medication Sig Dispense Refill   amoxicillin -clavulanate (AUGMENTIN ) 875-125 MG tablet Take 1 tablet by mouth 2 (two) times daily. 20 tablet 0   Continuous Blood Gluc Sensor (FREESTYLE LIBRE 14 DAY SENSOR) MISC SMARTSIG:1 Topical Every 2 Weeks     DULoxetine  (CYMBALTA ) 30 MG capsule Take 30 mg by mouth daily.     empagliflozin (JARDIANCE) 25 MG TABS tablet Take 25 mg by mouth daily.     fluticasone (FLONASE) 50 MCG/ACT nasal spray Place 2 sprays into both nostrils daily as needed for allergies or rhinitis.     HUMALOG KWIKPEN 200 UNIT/ML KwikPen SMARTSIG:15 Unit(s) SUB-Q 3 Times Daily     latanoprost  (XALATAN ) 0.005 % ophthalmic solution Place 1 drop into both eyes at bedtime.     LYUMJEV KWIKPEN 100 UNIT/ML KwikPen Lyumjev Rx for Novolog  is 22 units prior to breakfast, 18 units with lunch, 26 units with dinner Subcutaneous as directed; Duration: 30 days     metFORMIN  (GLUCOPHAGE ) 1000 MG tablet Take 1,000 mg by mouth 2 (two) times daily.     MOUNJARO 12.5 MG/0.5ML Pen Inject 12.5 mg into the skin once a week.     Multiple Vitamin (MULTIVITAMIN) tablet Take 1 tablet by mouth daily.     [Paused] Olmesartan -Amlodipine -HCTZ 40-10-25 MG TABS Take 1 tablet by mouth daily. Reported on 02/04/2015     omega-3 acid ethyl esters (LOVAZA ) 1 g capsule Take 1 capsule by mouth 2 (two) times daily.     oxyCODONE -acetaminophen  (PERCOCET) 5-325 MG tablet Take 1 tablet by mouth every 4 (four) hours as needed. 20 tablet 0   rosuvastatin  (CRESTOR ) 10 MG tablet Take 10 mg by mouth daily.     sildenafil (VIAGRA) 100 MG tablet One by mouth daily when necessary     silver  sulfADIAZINE  (SILVADENE ) 1 % cream Apply 1 Application topically daily. 30 g 1   No current facility-administered medications for this visit.     Abtx:  Anti-infectives (From admission, onward)    None       REVIEW OF SYSTEMS:  Const: negative  fever, negative chills, negative weight loss Eyes: negative diplopia or visual changes, negative eye pain ENT: negative coryza, negative sore throat Resp: negative cough, hemoptysis, dyspnea Cards: negative for chest pain, palpitations, lower extremity edema GU: negative for frequency, dysuria and hematuria GI: Negative  for abdominal pain, diarrhea, bleeding, constipation Skin: negative for rash and pruritus Heme: negative for easy bruising and gum/nose bleeding MS: negative for myalgias, arthralgias, back pain and muscle weakness Neurolo:negative for headaches, dizziness, vertigo, memory problems  Psych: negative for feelings of anxiety, depression  Endocrine:diabetes Allergy/Immunology- Robinul Objective:  VITALS:  Ht 6' 2 (1.88 m)   Wt 245 lb (111.1 kg)   BMI 31.46 kg/m   PHYSICAL EXAM:  General: Alert, cooperative, no distress, appears stated age.  Head: Normocephalic, without obvious abnormality, atraumatic. Eyes: Conjunctivae clear, anicteric sclerae. Pupils are equal ENT Nares normal. No drainage or sinus tenderness. Lips, mucosa, and tongue normal. No Thrush Neck: Supple, symmetrical, no adenopathy, thyroid: non tender no carotid bruit and no JVD. Back: No CVA tenderness. Lungs: Clear to auscultation bilaterally. No Wheezing or Rhonchi. No rales. Heart: Regular rate and rhythm, no murmur, rub or gallop. Abdomen: Soft, non-tender,not distended. Bowel sounds normal. No masses Extremities: left foot- surgical site looks fine There are 2 areas of pressure /friction wounds         Skin: No rashes or lesions. Or bruising Lymph: Cervical, supraclavicular normal. Neurologic: Grossly non-focal Pertinent Labs Lab Results CBC    Component Value Date/Time   WBC 11.4 (H) 12/28/2023 0551   RBC 4.57 12/28/2023 0551   HGB 14.1 12/28/2023 0551   HCT 41.7 12/28/2023 0551   PLT 289 12/28/2023 0551   MCV 91.2 12/28/2023 0551   MCH 30.9 12/28/2023 0551   MCHC 33.8  12/28/2023 0551   RDW 12.6 12/28/2023 0551   LYMPHSABS 2.2 12/04/2019 0917   MONOABS 0.6 12/04/2019 0917   EOSABS 0.1 12/04/2019 0917   BASOSABS 0.1 12/04/2019 0917       Latest Ref Rng & Units 12/26/2023    4:49 AM 12/04/2019    9:17 AM 01/26/2019    5:36 AM  CMP  Glucose 70 - 99 mg/dL 851  834  842   BUN 6 - 20 mg/dL 21  23  20    Creatinine 0.61 - 1.24 mg/dL 8.95  8.91  8.96   Sodium 135 - 145 mmol/L 137  136  138   Potassium 3.5 - 5.1 mmol/L 4.4  4.0  3.8   Chloride 98 - 111 mmol/L 100  101  104   CO2 22 - 32 mmol/L 25  23  27    Calcium  8.9 - 10.3 mg/dL 8.5  8.9  7.8   Total Protein 6.5 - 8.1 g/dL  7.0    Total Bilirubin 0.3 - 1.2 mg/dL  0.5    Alkaline Phos 38 - 126 U/L  43    AST 15 - 41 U/L  18    ALT 0 - 44 U/L  19        Microbiology: No results found for this or any previous visit (from the past 240 hours).   ? Impression/Recommendation ?Diabetic foot infection with ulcer/osteomyelitis of the 5 th toe of the left foot- s/p partial ray amputation Streptococcus Group G , enterococcus and prevotella in culture from 12/26/23 After getting IV in the hospital for 4 days he was discharged on high dose Augmentin   XR 2 grams BID ( this can be given as Amoxicillin  1 gram BID and Augmentin  875mg  BID) for 2 weeks and he completed that and is now on Augmentin  875 mg BID  HE underwent further surgery 4th met head  osteotomy last week Unable to see any pathology or culture report Discussed with Dr.Evans He is seeing him this  afternoon He will continue 2 more weeks of augmentin   Friction/pressure wounds on the dorsum of ankle and lateral margin Management as per Dr,Evans   DM pt takes, insulin , mounjaro, metfromin at home   PT will follow with me as needed HE will follow up with Dr.Evans ?______________________________ Discussed with patient,and Dr.Evans      [1]  Allergies Allergen Reactions   Robinul [Glycopyrrolate]     Caused throat muscles to relax and pt  couldn't swallow

## 2024-01-29 NOTE — Progress Notes (Signed)
 "  No chief complaint on file.   Subjective:  Patient presents today status post floating osteotomy fourth metatarsal left foot.  DOS: 01/24/2024.  Doing well.  WB in the cam boot as instructed.  Seen earlier today by infectious disease, Dr. Fayette  Past Medical History:  Diagnosis Date   Broken ankle    Broken arm    Cataracts    Diabetes mellitus without complication (HCC)    type 2   Diabetic retinopathy (HCC)    PDR OU   History of Bell's palsy    Hyperlipidemia    Hypertension    Osteomyelitis (HCC)    foot   Sleep apnea    wears CPAP    Past Surgical History:  Procedure Laterality Date   AMPUTATION Right 03/04/2015   Procedure: PARTIAL AMPUTATION RIGHT 5TH METATARSAL;  Surgeon: Jeb Broach, DPM;  Location: [facility] OR;  Service: Podiatry;  Laterality: Right;   AMPUTATION Left 12/26/2023   Procedure: AMPUTATION, FOOT, RAY;  Surgeon: Janit Thresa HERO, DPM;  Location: [facility] ORS;  Service: Orthopedics/Podiatry;  Laterality: Left;   APPENDECTOMY     COLONOSCOPY  11/17/2005   TAs - [facility]   COLONOSCOPY N/A 01/25/2019   Procedure: COLONOSCOPY;  Surgeon: Kristie Lamprey, MD;  Location: [facility] ENDOSCOPY;  Service: Endoscopy;  Laterality: N/A;   EYE SURGERY Bilateral    blood vessels -cautery   HEMOSTASIS CLIP PLACEMENT  01/25/2019   Procedure: HEMOSTASIS CLIP PLACEMENT;  Surgeon: Kristie Lamprey, MD;  Location: [facility] ENDOSCOPY;  Service: Endoscopy;;   I & D EXTREMITY Right 03/04/2015   Procedure: IRRIGATION AND DEBRIDEMENT RIGHT FOOT;  Surgeon: Jeb Broach, DPM;  Location: [facility] OR;  Service: Podiatry;  Laterality: Right;   WISDOM TOOTH EXTRACTION     only 2 ext    Allergies[1]         LT foot 01/29/2024  Objective/Physical Exam The incision to the dorsum of the left foot is well coapted with sutures intact.  No complications noted specifically to the surgical incision site.  Persistent wound noted to the dorsum of the left ankle and lateral column of the left foot.  There is  also a wound noted to the plantar aspect of the fourth MTP warranting the surgery that was performed last week.  All wounds appear stable.  Well adhered superficial eschar.  No drainage or immediate concern for acute cellulitis or infection.  Overall it appears stable for the moment  The foot is well-perfused and warm to touch.  Good pulses.  Clinically no concern for vascular compromise.  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  Stable.  No erosions concerning for underlying osteomyelitis.  Osteotomy noted at the neck of the fourth metatarsal with dorsal translocation of the metatarsal head  Assessment: 1. s/p fourth metatarsal floating osteotomy left foot. DOS: 01/24/2024 2.  Diabetic ulcer plantar aspect of the fourth MTP left with ulcers to the lateral column of the foot and dorsum of the left ankle   Plan of Care:  -Patient was evaluated. X-rays reviewed - Plan for continued oral antibiotics.  Seen today, 01/29/2024, with infectious disease Dr. Fredia Fresh.  Plan to continue the oral Augmentin  875/125 mg x 2 additional weeks -Continue daily dressing changes consisting of Betadine applied periwound followed by nonadherent Xeroform and lightly wrapped Ace.  Patient received an order wound supplies -Continue WBAT cam boot -Return to clinic 2 weeks  *Goes by [employer]. 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    Thresa EMERSON Sar, DPM [facility]  Dr. Thresa EMERSON Sar, DPM    [address] [place], [place] [address]                Office [phone]  Fax [phone]             [1]  Allergies Allergen Reactions   Robinul [Glycopyrrolate]     Caused throat muscles to relax and pt couldn't swallow   "

## 2024-02-01 ENCOUNTER — Encounter: Admitting: Podiatry

## 2024-02-12 ENCOUNTER — Encounter: Admitting: Podiatry

## 2024-02-15 ENCOUNTER — Encounter: Admitting: Podiatry

## 2024-02-29 ENCOUNTER — Encounter: Admitting: Podiatry

## 2024-07-28 ENCOUNTER — Ambulatory Visit: Admitting: Adult Health
